# Patient Record
Sex: Female | Born: 1940
Health system: Southern US, Community
[De-identification: ages and names within clinical notes are randomized; demographics above are authoritative.]

## PROBLEM LIST (undated history)

## (undated) DIAGNOSIS — I2510 Atherosclerotic heart disease of native coronary artery without angina pectoris: Secondary | ICD-10-CM

## (undated) DIAGNOSIS — C4490 Unspecified malignant neoplasm of skin, unspecified: Secondary | ICD-10-CM

## (undated) DIAGNOSIS — I119 Hypertensive heart disease without heart failure: Secondary | ICD-10-CM

## (undated) DIAGNOSIS — I495 Sick sinus syndrome: Secondary | ICD-10-CM

## (undated) DIAGNOSIS — J189 Pneumonia, unspecified organism: Secondary | ICD-10-CM

## (undated) DIAGNOSIS — I480 Paroxysmal atrial fibrillation: Secondary | ICD-10-CM

## (undated) DIAGNOSIS — E119 Type 2 diabetes mellitus without complications: Secondary | ICD-10-CM

## (undated) DIAGNOSIS — N183 Chronic kidney disease, stage 3 unspecified: Secondary | ICD-10-CM

## (undated) DIAGNOSIS — I5032 Chronic diastolic (congestive) heart failure: Secondary | ICD-10-CM

## (undated) DIAGNOSIS — E039 Hypothyroidism, unspecified: Secondary | ICD-10-CM

## (undated) DIAGNOSIS — E785 Hyperlipidemia, unspecified: Secondary | ICD-10-CM

## (undated) DIAGNOSIS — F329 Major depressive disorder, single episode, unspecified: Secondary | ICD-10-CM

## (undated) DIAGNOSIS — E875 Hyperkalemia: Secondary | ICD-10-CM

## (undated) DIAGNOSIS — K219 Gastro-esophageal reflux disease without esophagitis: Secondary | ICD-10-CM

## (undated) DIAGNOSIS — Z951 Presence of aortocoronary bypass graft: Secondary | ICD-10-CM

## (undated) DIAGNOSIS — R001 Bradycardia, unspecified: Secondary | ICD-10-CM

## (undated) DIAGNOSIS — R52 Pain, unspecified: Secondary | ICD-10-CM

## (undated) DIAGNOSIS — G3184 Mild cognitive impairment, so stated: Secondary | ICD-10-CM

## (undated) DIAGNOSIS — K5790 Diverticulosis of intestine, part unspecified, without perforation or abscess without bleeding: Secondary | ICD-10-CM

## (undated) DIAGNOSIS — Z9889 Other specified postprocedural states: Secondary | ICD-10-CM

## (undated) DIAGNOSIS — K449 Diaphragmatic hernia without obstruction or gangrene: Secondary | ICD-10-CM

## (undated) DIAGNOSIS — F32A Depression, unspecified: Secondary | ICD-10-CM

## (undated) DIAGNOSIS — K222 Esophageal obstruction: Secondary | ICD-10-CM

## (undated) DIAGNOSIS — K649 Unspecified hemorrhoids: Secondary | ICD-10-CM

## (undated) DIAGNOSIS — M1990 Unspecified osteoarthritis, unspecified site: Secondary | ICD-10-CM

## (undated) DIAGNOSIS — D649 Anemia, unspecified: Secondary | ICD-10-CM

## (undated) DIAGNOSIS — F419 Anxiety disorder, unspecified: Secondary | ICD-10-CM

## (undated) DIAGNOSIS — I219 Acute myocardial infarction, unspecified: Secondary | ICD-10-CM

## (undated) DIAGNOSIS — I10 Essential (primary) hypertension: Secondary | ICD-10-CM

## (undated) DIAGNOSIS — R519 Headache, unspecified: Secondary | ICD-10-CM

## (undated) DIAGNOSIS — R112 Nausea with vomiting, unspecified: Secondary | ICD-10-CM

## (undated) DIAGNOSIS — Z950 Presence of cardiac pacemaker: Secondary | ICD-10-CM

## (undated) DIAGNOSIS — I4892 Unspecified atrial flutter: Secondary | ICD-10-CM

## (undated) DIAGNOSIS — G8929 Other chronic pain: Secondary | ICD-10-CM

## (undated) HISTORY — PX: JOINT REPLACEMENT: SHX530

## (undated) HISTORY — DX: Chronic kidney disease, stage 3 unspecified: N18.30

## (undated) HISTORY — DX: Diverticulosis of intestine, part unspecified, without perforation or abscess without bleeding: K57.90

## (undated) HISTORY — DX: Hypertensive heart disease without heart failure: I11.9

## (undated) HISTORY — DX: Other chronic pain: G89.29

## (undated) HISTORY — PX: DILATION AND CURETTAGE OF UTERUS: SHX78

## (undated) HISTORY — DX: Unspecified atrial flutter: I48.92

## (undated) HISTORY — DX: Esophageal obstruction: K22.2

## (undated) HISTORY — PX: FRACTURE SURGERY: SHX138

## (undated) HISTORY — PX: ANKLE FRACTURE SURGERY: SHX122

## (undated) HISTORY — PX: ESOPHAGOGASTRODUODENOSCOPY (EGD) WITH ESOPHAGEAL DILATION: SHX5812

## (undated) HISTORY — DX: Chronic diastolic (congestive) heart failure: I50.32

## (undated) HISTORY — DX: Diaphragmatic hernia without obstruction or gangrene: K44.9

## (undated) HISTORY — DX: Headache, unspecified: R51.9

## (undated) HISTORY — PX: CATARACT EXTRACTION W/ INTRAOCULAR LENS  IMPLANT, BILATERAL: SHX1307

## (undated) HISTORY — PX: ABDOMINAL HYSTERECTOMY: SHX81

## (undated) HISTORY — DX: Sick sinus syndrome: I49.5

## (undated) HISTORY — PX: TOTAL KNEE ARTHROPLASTY: SHX125

## (undated) HISTORY — DX: Chronic kidney disease, stage 3 (moderate): N18.3

## (undated) HISTORY — DX: Hyperlipidemia, unspecified: E78.5

## (undated) HISTORY — DX: Unspecified hemorrhoids: K64.9

---
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
## 1999-03-16 ENCOUNTER — Encounter: Payer: Self-pay | Admitting: Cardiology

## 1999-03-16 ENCOUNTER — Ambulatory Visit (HOSPITAL_COMMUNITY): Admission: RE | Admit: 1999-03-16 | Discharge: 1999-03-16 | Payer: Self-pay | Admitting: Cardiology

## 2000-04-05 ENCOUNTER — Other Ambulatory Visit: Admission: RE | Admit: 2000-04-05 | Discharge: 2000-04-05 | Payer: Self-pay | Admitting: Gynecology

## 2001-06-01 ENCOUNTER — Encounter: Admission: RE | Admit: 2001-06-01 | Discharge: 2001-08-30 | Payer: Self-pay | Admitting: Orthopedic Surgery

## 2002-06-19 ENCOUNTER — Ambulatory Visit (HOSPITAL_COMMUNITY): Admission: RE | Admit: 2002-06-19 | Discharge: 2002-06-19 | Payer: Self-pay | Admitting: Cardiology

## 2002-06-19 ENCOUNTER — Encounter: Payer: Self-pay | Admitting: Cardiology

## 2004-02-27 ENCOUNTER — Other Ambulatory Visit: Admission: RE | Admit: 2004-02-27 | Discharge: 2004-02-27 | Payer: Self-pay | Admitting: Gynecology

## 2004-10-22 ENCOUNTER — Ambulatory Visit: Payer: Self-pay | Admitting: Family Medicine

## 2004-10-25 HISTORY — PX: BACK SURGERY: SHX140

## 2004-11-13 ENCOUNTER — Ambulatory Visit: Payer: Self-pay | Admitting: Family Medicine

## 2004-11-17 ENCOUNTER — Ambulatory Visit: Payer: Self-pay | Admitting: Family Medicine

## 2004-12-14 ENCOUNTER — Ambulatory Visit: Payer: Self-pay | Admitting: Family Medicine

## 2004-12-18 ENCOUNTER — Ambulatory Visit: Payer: Self-pay | Admitting: Family Medicine

## 2004-12-31 ENCOUNTER — Ambulatory Visit: Payer: Self-pay | Admitting: Family Medicine

## 2005-04-23 ENCOUNTER — Ambulatory Visit: Payer: Self-pay | Admitting: Family Medicine

## 2005-05-18 ENCOUNTER — Ambulatory Visit: Payer: Self-pay | Admitting: Family Medicine

## 2005-07-14 ENCOUNTER — Ambulatory Visit: Payer: Self-pay | Admitting: Family Medicine

## 2005-10-05 ENCOUNTER — Ambulatory Visit: Payer: Self-pay | Admitting: Family Medicine

## 2005-10-13 ENCOUNTER — Ambulatory Visit: Payer: Self-pay | Admitting: Family Medicine

## 2005-11-19 ENCOUNTER — Inpatient Hospital Stay (HOSPITAL_COMMUNITY): Admission: RE | Admit: 2005-11-19 | Discharge: 2005-11-24 | Payer: Self-pay | Admitting: Orthopedic Surgery

## 2005-11-19 ENCOUNTER — Ambulatory Visit: Payer: Self-pay | Admitting: Physical Medicine & Rehabilitation

## 2005-11-24 ENCOUNTER — Encounter: Payer: Self-pay | Admitting: Internal Medicine

## 2006-09-26 ENCOUNTER — Inpatient Hospital Stay (HOSPITAL_COMMUNITY): Admission: RE | Admit: 2006-09-26 | Discharge: 2006-09-27 | Payer: Self-pay | Admitting: Orthopedic Surgery

## 2007-08-22 ENCOUNTER — Ambulatory Visit: Payer: Self-pay | Admitting: Internal Medicine

## 2007-08-31 ENCOUNTER — Ambulatory Visit: Payer: Self-pay | Admitting: Internal Medicine

## 2007-12-19 DIAGNOSIS — E8941 Symptomatic postprocedural ovarian failure: Secondary | ICD-10-CM | POA: Insufficient documentation

## 2007-12-19 DIAGNOSIS — N7093 Salpingitis and oophoritis, unspecified: Secondary | ICD-10-CM | POA: Insufficient documentation

## 2007-12-19 DIAGNOSIS — S8990XA Unspecified injury of unspecified lower leg, initial encounter: Secondary | ICD-10-CM | POA: Insufficient documentation

## 2007-12-19 DIAGNOSIS — Z96659 Presence of unspecified artificial knee joint: Secondary | ICD-10-CM | POA: Insufficient documentation

## 2007-12-19 DIAGNOSIS — M545 Low back pain, unspecified: Secondary | ICD-10-CM | POA: Insufficient documentation

## 2007-12-19 DIAGNOSIS — S99919A Unspecified injury of unspecified ankle, initial encounter: Secondary | ICD-10-CM

## 2007-12-19 DIAGNOSIS — S99929A Unspecified injury of unspecified foot, initial encounter: Secondary | ICD-10-CM | POA: Insufficient documentation

## 2007-12-19 DIAGNOSIS — K5909 Other constipation: Secondary | ICD-10-CM | POA: Insufficient documentation

## 2007-12-19 DIAGNOSIS — K649 Unspecified hemorrhoids: Secondary | ICD-10-CM | POA: Insufficient documentation

## 2009-11-28 ENCOUNTER — Ambulatory Visit: Payer: Self-pay | Admitting: Internal Medicine

## 2009-11-28 DIAGNOSIS — R143 Flatulence: Secondary | ICD-10-CM

## 2009-11-28 DIAGNOSIS — K5900 Constipation, unspecified: Secondary | ICD-10-CM | POA: Insufficient documentation

## 2009-11-28 DIAGNOSIS — R142 Eructation: Secondary | ICD-10-CM

## 2009-11-28 DIAGNOSIS — K222 Esophageal obstruction: Secondary | ICD-10-CM | POA: Insufficient documentation

## 2009-11-28 DIAGNOSIS — K219 Gastro-esophageal reflux disease without esophagitis: Secondary | ICD-10-CM | POA: Insufficient documentation

## 2009-11-28 DIAGNOSIS — R141 Gas pain: Secondary | ICD-10-CM | POA: Insufficient documentation

## 2010-11-26 NOTE — Assessment & Plan Note (Signed)
Summary: STOMACH SWELLING AND ESOPHAGUS PROBLEMS...EM    History of Present Illness Visit Type: Follow-up Visit Primary GI MD: Scarlette Shorts MD Primary Provider: Haywood Pao, MD  Requesting Provider: n/a Chief Complaint: Dysphagia, and epigastric pain  History of Present Illness:   70 year old female with multiple medical problems including hypertension, hyperlipidemia, hypothyroidism, type 2 diabetes mellitus, gastroesophageal reflux disease complicated by peptic stricture, and mild diverticulosis. She presents today complaining of reflux disease, mild dysphagia, belching, regurgitation, increased gas, and constipation. It appears that the patient ran out of Nexium in November due to "the doughnut hole". She began to have significant heartburn associated with chest discomfort and recurrent dysphagia. As well significant regurgitation. She resumed medication approximately 2 weeks ago. Burning and dysphagia have resolved. Regurgitation continues. She is not adhering to reflux precautions. She requests samples of proton pump inhibitor. For her constipation she takes stool softeners without relief. She has tried MiraLax for a short period of time. She is uncertain of the dose or duration. Her multiple chronic medical problems are stable. She is consciously swallowing air and belching during the interview.   GI Review of Systems    Reports acid reflux, belching, dysphagia with solids, heartburn, and  nausea.      Denies abdominal pain, bloating, chest pain, dysphagia with liquids, loss of appetite, vomiting, vomiting blood, weight loss, and  weight gain.      Reports constipation.     Denies anal fissure, black tarry stools, change in bowel habit, diarrhea, diverticulosis, fecal incontinence, heme positive stool, hemorrhoids, irritable bowel syndrome, jaundice, light color stool, liver problems, rectal bleeding, and  rectal pain.    Current Medications (verified): 1)  Atenolol 50 Mg Tabs  (Atenolol) .Marland Kitchen.. 1 Tablet By Mouth Once Daily 2)  Synthroid 50 Mcg Tabs (Levothyroxine Sodium) .Marland Kitchen.. 1 Tablet By Mouth Once Daily 3)  Janumet 50-500 Mg Tabs (Sitagliptin-Metformin Hcl) .... One Tablet To One and 1/2 Tablet By Mouth Daily 4)  Quinapril Hcl 20 Mg Tabs (Quinapril Hcl) .... One Tablet By Mouth Once Daily 5)  Lyrica 75 Mg Caps (Pregabalin) .... One Tablet By Mouth Once Daily 6)  Nexium 40 Mg Cpdr (Esomeprazole Magnesium) .... One Tablet By Mouth Once Daily 7)  Bayer Low Strength 81 Mg Tbec (Aspirin) .... One Tablet By Mouth Once Daily 8)  Centrum Cardio  Tabs (Multiple Vitamins-Minerals) .... One Tablet By Mouth Once Daily 9)  Stool Softener 100 Mg Caps (Docusate Sodium) .... Two Tablets By Mouth At Bedtime 10)  Centrum Silver  Tabs (Multiple Vitamins-Minerals) .... One Tablet By Mouth Once Daily 11)  Vitamin E 600 Unit  Caps (Vitamin E) .... One Tablet By Mouth Once Daily 12)  Vitamin B-6 250 Mg Tabs (Pyridoxine Hcl) .... One Tablet By Mouth Once Daily 13)  Fish Oil 1000 Mg Caps (Omega-3 Fatty Acids) .... One Tablet By Mouth Once Daily 14)  Vitamin C 500 Mg  Tabs (Ascorbic Acid) .... One Tablet By Mouth Once Daily 15)  Vitamin D 1000 Unit Tabs (Cholecalciferol) .... One Tablet By Mouth Once Daily 16)  Tylenol Arthritis Pain 650 Mg Cr-Tabs (Acetaminophen) .... As Needed  Allergies (verified): 1)  ! * Mycins 2)  ! Epinephrine  Past History:  Past Medical History: Reviewed history from 11/26/2009 and no changes required. Anemia Diabetes, Type 2 Diverticulitis G E R D Hyperlipidemia Hypertension Hypothyroidism Hemorrhoids  Past Surgical History: Tonsillectomy Hysterectomy BSO Lt. TKR Rt. ankle  Back Surgery Cataract Surgery for left eye   Family History: No  FH of Colon Cancer:  Social History: Occupation: Retired Widowed   Patient has never smoked.  Alcohol Use - no Illicit Drug Use - no Daily Caffeine Use: coffee daily  Smoking Status:  never Drug Use:   no  Review of Systems       arthritis, fatigue. All other systems reviewed and reported as negative  Vital Signs:  Patient profile:   70 year old female Height:      61 inches Weight:      143 pounds BMI:     27.12 BSA:     1.64 Pulse rate:   68 / minute Pulse rhythm:   regular BP sitting:   124 / 72  (left arm) Cuff size:   regular  Vitals Entered By: Hope Pigeon CMA (November 28, 2009 1:28 PM)  Physical Exam  General:  Well developed, well nourished, no acute distress. Head:  Normocephalic and atraumatic. Eyes:  PERRLA, no icterus. Nose:  No deformity, discharge,  or lesions. Mouth:  No deformity or lesions,  Lungs:  Clear throughout to auscultation. Heart:  Regular rate and rhythm; no murmurs, rubs,  or bruits. Abdomen:  Soft, nontender and nondistended. No masses, hepatosplenomegaly or hernias noted. Normal bowel sounds. Msk:  Symmetrical with no gross deformities. Normal posture. Pulses:  Normal pulses noted. Extremities:  No clubbing, cyanosis, edema or deformities noted. Neurologic:  Alert and  oriented x4;  Skin:  Intact without significant lesions or rashes. Psych:  Alert and cooperative. Normal mood and affect.   Impression & Recommendations:  Problem # 1:  GERD (ICD-530.81) GERD. Upper endoscopy in 2008 revealing mild esophagitis and peptic stricture. Significant recurrence of GERD symptoms off proton pump inhibitor therapy. Improvement in symptoms after resuming Nexium.  Plan  #1. Strict adherence to reflux precautions with attention to weight loss, small meal size, elevation of head of bed. #2. Literature on reflux disease reviewed and brochure provided #3. Literature on reflux precautions reviewed and brochure provided #4. Nexium 40 mg daily. Some samples given at her request #5. If she cannot afford Nexium and take omeprazole 20 mg or 40 mg daily to control symptoms. #6. Greater than one half of today's office encounter was spent counseling this  patient. #7. Routine followup in one year. Sooner for interval problems.  Problem # 2:  ESOPHAGEAL STRICTURE (ICD-530.3) esophageal stricture previously requiring dilation. Patient has had some recurrent transient dysphagia, though this has improved on PPI therapy. Continue PPI therapy. Consider repeat esophageal dilation should significant dysphagia recur.  Problem # 3:  FLATULENCE-GAS-BLOATING (ICD-787.3) I discussed with the patient the issues regarding bloating and belching. I told her that swallowing air with definite result in increased belching. She should refrain from this activity. Also, she was provided literature on intestinal gas. This was reviewed.  Problem # 4:  CONSTIPATION (ICD-564.00) functional constipation. Colonoscopy in 2008 essentially normal.  Plan #1. GlycoLax powder. Start with 17 g daily in 8 ounces of water. I explained to her that she could titrate this in order to achieve the desired effect.  Patient Instructions: 1)  Miralax instructions given to patient with discount coupon. 2)  Nexium sample given. 3)  Gas prevention diet info given. 4)  GERD Brochure given to patient. 5)  Please schedule a follow-up appointment as needed; otherwise one year.  6)  The medication list was reviewed and reconciled.  All changed / newly prescribed medications were explained.  A complete medication list was provided to the patient / caregiver. 7)  Copy Dr. Domenick Gong

## 2010-11-26 NOTE — Discharge Summary (Signed)
Summary: Discharge Summary  NAMEAKI, [name]               ACCOUNT NO.:  [id number]   MEDICAL RECORD NO.:  [id number]          PATIENT TYPE:  INP   LOCATION:  [place]                         FACILITY:  [facility]   PHYSICIAN:  Gaynelle Arabian, M.D.    DATE OF BIRTH:  October 02, 1941   DATE OF ADMISSION:  11/19/2005  DATE OF DISCHARGE:  11/25/2005                           DISCHARGE SUMMARY - REFERRING   ADMISSION DIAGNOSES:  1.  Osteoarthritis, left knee.  2.  Mild episodic anxiety/depression.  3.  Past history of bronchitis.  4.  Hypertension.  5.  Hyperkalemia.  6.  Reflux disease.  7.  Past history of ulcers.  8.  Diverticulosis.  9.  Urinary incontinence.  10. Peripheral neuropathy.  11. Non-insulin-dependent diabetes mellitus.   DISCHARGE DIAGNOSES:  1.  Osteoarthritis, left knee, status post left total knee arthroplasty.  2.  Postoperative blood loss anemia, did not require transfusion.  3.  Mild episodic anxiety/depression.  4.  Hypertension.  5.  Hyperkalemia.  6.  Reflux disease.  7.  Past history of ulcers.  8.  Diverticulosis.  9.  Urinary incontinence.  10. Peripheral neuropathy.  11. Non-insulin-dependent diabetes mellitus.  12. Past history of bronchitis.   PROCEDURE:  November 19, 2005.   SURGEON:  Gaynelle Arabian, M.D.   ASSISTANT:  Mickel Crow, P.A.C.   ANESTHESIA:  Spinal.   TOURNIQUET TIME:  32 minutes.   CONSULTATIONS:  Rehabilitation Services.   HISTORY OF PRESENT ILLNESS:  [name] is a 70 year old female with severe  end-stage arthritis of the left knee, with bone on bone throughout.  Failed  nonoperative management.  Now presents for a total knee.   LABORATORY DATA:  Preoperative CBC:  Hemoglobin and hematocrit 11.9 and  33.7. Normal white count. Serial H&H's were followed. Dropped down to 8.2,  and came back up to 8.9. PT/PTT on admission 12.6 and 31 respectively. INR  0.9. Serial protime as follows:  PT/INR 27.9 and 2.6. Chem panel on  admission:  Elevated potassium of 5.3. Remaining chem panel within normal  limits. Serial BMETs were followed. Potassium came down to normal level of  4.2. Remaining BMETs within normal limits. Preoperative day negative.   Chest x-ray:  Normal chest on November 15, 2005. Left humerus films:  Humerus  itself feels normal. One can appreciate calcifications distal to the humeral  head, that are probably a piece calcium of the rotator cuff tendons, loosely  associated with the radial head. That is just on the edge of the film and  could represent a radial head fracture. Elbow not examined.   HOSPITAL COURSE:  Admitted to [facility]  procedure well. Later transferred to the recovery room and to the orthopedic  floor. Started on PCA and p.o. analgesics for pain control following  surgery. Home medications were restarted. Placed on weightbearing as  tolerated. PT and OT were consulted for ambulation and ADLs. On day 1, was  doing pretty well with her pain. Hemovac drain was discontinued. She  underwent a rehab consult. The patient was seen in rehab service and felt to  be an appropriate candidate for inpatient rehab [facility] stay. Therefore, it was  decided that she would transfer at which time a bed became available. By day  2, she started getting up and dressing was changed. Incision was healing  well. She started progressing with her physical therapy and could ambulate  approximately 60 feet by day 3. Hemoglobin had dropped down to 8.2, but she  was asymptomatic with this. She was placed on Trinsicon. Hemoglobin came  back at 8.9. She started progressing very well with physical therapy. It was  decided whether she would go to [facility] or rehab.  She as doing well on rounds  on November 25, 2005.  The patient was ready for transfer. We are awaiting  the final word if there is a bed available on [facility] versus [facility],  which was another bed offer. At the time of this  dictation, we are waiting  to hear from both, but the patient can be discharged.   DISCHARGE PLAN:  Patient discharged on November 25, 2005.   DISCHARGE DIAGNOSES:  Please see above.   DISCHARGE MEDICATIONS:  1.  Coumadin protocol.  Titrate Coumadin protocol for 3 weeks from date of      surgery. Titrate the INR between 2.0 and 3.0.  2.  Colace 100 mg p.o. b.i.d.  3.  Avandia 8 mg p.o. daily.  4.  Glucophage 1000 mg p.o. daily.  5.  Tenormin 50 mg daily.  6.  Neurontin 300 mg p.o. b.i.d.  7.  Nexium 40 mg p.o. daily.  8.  Trinsicon 1 p.o. t.i.d. x3 weeks.  9.  Percocet 1 or 2 every 4-6 hours as needed for pain.  10. Tylenol 1 to 2 every 4-6 hours as needed for mild pain or temporal      headache.  11. Robaxin 500 mg p.o. q.6 h. p.r.n. spasm.  12. Laxative of choice.  13. Enema of choice.   DIET:  Diabetic cardiac diet.   ACTIVITY:  She can be weightbearing as tolerated on the left lower  extremity. Gait training, ambulation, and ADL's as per PT and OT while on  either rehabilitation or inpatient unit. Total knee protocol. She may start  showering and daily dressing changes.   FOLLOW UP:  Two weeks from surgery. Contact the office at [phone] to  arrange an appointment at time of transfer of the patient, about 2 weeks  postoperative.   DISPOSITION:  Pending at the time of this dictation.   CONDITION ON DISCHARGE:  Improved.      Alexzandrew L. Dara Lords, P.A.      Gaynelle Arabian, M.D.  Electronically Signed    ALP/MEDQ  D:  11/24/2005  T:  11/24/2005  Job:  [id number]   cc:   Mardene Celeste A. Rocky Link, M.D.  Fax: [phone]

## 2010-11-26 NOTE — Procedures (Signed)
Summary: Gastroenterology EGD  Gastroenterology EGD   Imported By: Marisue Humble CMA 12/28/2007 11:20:35  _____________________________________________________________________  External Attachment:    Type:   Image     Comment:   External Document

## 2010-11-26 NOTE — Procedures (Signed)
Summary: Gastroenterology COLON  Gastroenterology COLON   Imported By: Marisue Humble CMA 12/28/2007 11:19:02  _____________________________________________________________________  External Attachment:    Type:   Image     Comment:   External Document

## 2010-12-14 ENCOUNTER — Encounter: Payer: Self-pay | Admitting: Internal Medicine

## 2010-12-14 ENCOUNTER — Ambulatory Visit (INDEPENDENT_AMBULATORY_CARE_PROVIDER_SITE_OTHER): Payer: Medicare Other | Admitting: Internal Medicine

## 2010-12-14 DIAGNOSIS — R1310 Dysphagia, unspecified: Secondary | ICD-10-CM

## 2010-12-14 DIAGNOSIS — R141 Gas pain: Secondary | ICD-10-CM

## 2010-12-14 DIAGNOSIS — E119 Type 2 diabetes mellitus without complications: Secondary | ICD-10-CM | POA: Insufficient documentation

## 2010-12-14 DIAGNOSIS — R1013 Epigastric pain: Secondary | ICD-10-CM

## 2010-12-14 DIAGNOSIS — R143 Flatulence: Secondary | ICD-10-CM

## 2010-12-14 DIAGNOSIS — K219 Gastro-esophageal reflux disease without esophagitis: Secondary | ICD-10-CM

## 2010-12-14 DIAGNOSIS — IMO0001 Reserved for inherently not codable concepts without codable children: Secondary | ICD-10-CM | POA: Insufficient documentation

## 2010-12-17 ENCOUNTER — Telehealth: Payer: Self-pay | Admitting: Internal Medicine

## 2010-12-22 NOTE — Assessment & Plan Note (Signed)
Summary:  "Stomach swelling , gas, and esophagus problems "    History of Present Illness Visit Type: Follow-up Visit Primary GI MD: Scarlette Shorts MD Primary Provider: Haywood Pao, MD  Requesting Provider: n/a Chief Complaint: dyspha, GERD  worsening symptoms x 2 months History of Present Illness:    70 year old female with hypertension, hyperlipidemia, hypothyroidism, diabetes mellitus, and GERD. she was evaluated in October of 2008 for GERD , dysphagia, and colon cancer screening. in November of 2008, she underwent colonoscopy and upper endoscopy. Colonoscopy was unremarkable. Upper endoscopy is normal except for mild reflux esophagitis and a large caliber distal stricture which was dilated. It was recommended that she continue on PPI therapy. However, she is come off of PPI therapy due to cost. She takes over-the-counter acid suppressing agents on a when necessary basis. Recently she has experienced severe process and some discomfort postprandially. She saw  chiropractorwho told her that acid suppressive medication was not a good idea. When she takes PPI therapy more regularly things are better. She is concerned that she may have esophageal cancer. Some mild  dysphagia. No weight loss. She does complain of chronic belching and flatus.Marland Kitchen   GI Review of Systems    Reports abdominal pain, acid reflux, belching, bloating, chest pain, dysphagia with solids, heartburn, nausea, and  vomiting.     Location of  Abdominal pain: epigastric area.    Denies dysphagia with liquids, loss of appetite, vomiting blood, weight loss, and  weight gain.      Reports constipation.     Denies anal fissure, black tarry stools, change in bowel habit, diarrhea, diverticulosis, fecal incontinence, heme positive stool, hemorrhoids, irritable bowel syndrome, jaundice, light color stool, liver problems, rectal bleeding, and  rectal pain.    Current Medications (verified): 1)  Lisinopril 20 Mg Tabs (Lisinopril) ....  Take 1 Tablet By Mouth Once Daily 2)  Synthroid 50 Mcg Tabs (Levothyroxine Sodium) .Marland Kitchen.. 1 Tablet By Mouth Once Daily 3)  Janumet 50-500 Mg Tabs (Sitagliptin-Metformin Hcl) .... One Tablet By Mouth Two Times A Day 4)  Quinapril Hcl 20 Mg Tabs (Quinapril Hcl) .... One Tablet By Mouth Once Daily 5)  Lyrica 75 Mg Caps (Pregabalin) .... One Tablet By Mouth Once Daily 6)  Nexium 40 Mg Cpdr (Esomeprazole Magnesium) .... One Tablet By Mouth Once Daily 7)  Bayer Low Strength 81 Mg Tbec (Aspirin) .... One Tablet By Mouth Once Daily 8)  Centrum Cardio  Tabs (Multiple Vitamins-Minerals) .... One Tablet By Mouth Once Daily 9)  Stool Softener 100 Mg Caps (Docusate Sodium) .... Two Tablets By Mouth At Bedtime 10)  Centrum Silver  Tabs (Multiple Vitamins-Minerals) .... One Tablet By Mouth Once Daily 11)  Vitamin E 600 Unit  Caps (Vitamin E) .... One Tablet By Mouth Once Daily 12)  Vitamin B-6 250 Mg Tabs (Pyridoxine Hcl) .... One Tablet By Mouth Once Daily 13)  Fish Oil 1000 Mg Caps (Omega-3 Fatty Acids) .... One Tablet By Mouth Once Daily 14)  Vitamin C 500 Mg  Tabs (Ascorbic Acid) .... One Tablet By Mouth Once Daily 15)  Vitamin D 1000 Unit Tabs (Cholecalciferol) .... One Tablet By Mouth Once Daily 16)  Tylenol Arthritis Pain 650 Mg Cr-Tabs (Acetaminophen) .... As Needed 17)  Glimepiride 4 Mg Tabs (Glimepiride) .... Take 1 Tablet By Mouth Once Daily 18)  Miralax  Powd (Polyethylene Glycol 3350) .... As Needed  Allergies (verified): 1)  ! * Mycins 2)  ! Epinephrine  Past History:  Past Medical History: Reviewed  history from 11/26/2009 and no changes required. Anemia Diabetes, Type 2 Diverticulitis G E R D Hyperlipidemia Hypertension Hypothyroidism Hemorrhoids  Past Surgical History: Tonsillectomy Hysterectomy BSO Lt. TKR Rt. ankle  Back Surgery Cataract Surgery x2  Family History: Reviewed history from 11/28/2009 and no changes required. No FH of Colon Cancer:  Social  History: Reviewed history from 11/28/2009 and no changes required. Occupation: Retired Widowed   Patient has never smoked.  Alcohol Use - no Illicit Drug Use - no Daily Caffeine Use: coffee daily   Review of Systems       The patient complains of allergy/sinus, arthritis/joint pain, fatigue, and headaches-new.  The patient denies anemia, anxiety-new, back pain, blood in urine, breast changes/lumps, change in vision, confusion, cough, coughing up blood, depression-new, fainting, fever, hearing problems, heart murmur, heart rhythm changes, itching, menstrual pain, muscle pains/cramps, night sweats, nosebleeds, pregnancy symptoms, shortness of breath, skin rash, sleeping problems, sore throat, swelling of feet/legs, swollen lymph glands, thirst - excessive , urination - excessive , urination changes/pain, urine leakage, vision changes, and voice change.    Vital Signs:  Patient profile:   70 year old female Height:      61 inches Weight:      139.13 pounds BMI:     26.38 Pulse rate:   72 / minute Pulse rhythm:   regular BP sitting:   162 / 78  (left arm) Cuff size:   regular  Vitals Entered By: June McMurray [employer] Deborra Medina) (December 14, 2010 10:32 AM)  Physical Exam  General:  Well developed, well nourished, no acute distress. Head:  Normocephalic and atraumatic. Eyes:  PERRLA, no icterus. Nose:  No deformity, discharge,  or lesions. Mouth:  No deformity or lesions. Neck:  Supple; no masses or thyromegaly. Lungs:  Clear throughout to auscultation. Heart:  Regular rate and rhythm; no murmurs, rubs,  or bruits. Abdomen:  Soft, nontender and nondistended. No masses, hepatosplenomegaly or hernias noted. Normal bowel sounds. Msk:  Symmetrical with no gross deformities. Normal posture. Pulses:  Normal pulses noted. Extremities:  No clubbing, cyanosis, edema or deformities noted. Neurologic:  Alert and  oriented x4;  grossly normal neurologically. Skin:  Intact without significant lesions  or rashes. Psych:  Alert and cooperative. Normal mood and affect.   Impression & Recommendations:  Problem # 1:  GERD (ICD-530.81)  worsening GERD off regular PPI therapy   Plan : #1. Nexium 40 mg daily. 30 minutes before first meal  #2. reflux precautions  #3. literature on reflux precautions provided  Problem # 2:  DYSPHAGIA UNSPECIFIED (ICD-787.20)  mild dysphagia. history of peptic stricture. may be due to esophageal edema off PPI.   Plan :   #1. daily PPI #2. Upper endoscopy with possible esophageal dilation. The nature of the procedure as well as the risks, benefits, and alternatives were reviewed. She understood and agreed to proceed  #3.  adjust diabetic medications. See below  Problem # 3:  FLATULENCE-GAS-BLOATING (ICD-787.3)  gas and belching. no worrisome features.  Plan : #1. anti-gas and flatulence dietary sheet #2. discussion with patient today on intestinal gas as well as educational brochure provided for her review.  Problem # 4:  CONSTIPATION (ICD-564.00) Assessment: Unchanged  functional  Problem # 5:  DIABETES MELLITUS-TYPE II (ICD-250.00)  hold diabetic medications the day of the procedure until normal oral intake resumed. We will monitor her blood sugar immediately before and after the procedure  Patient Instructions: 1)  Patient will call to schedule EGD Dil once she  checks with her daughter.  [phone] (You are to hold DM meds day of procedure.)  Ask for [facility] when you call back to schedule procedure. 2)  Nexium samples given to patient. 3)  Reflux precaution sheet given. 4)  Gas brochure and prevention diet given. 5)  Copy sent to : Haywood Pao, MD  6)  The medication list was reviewed and reconciled.  All changed / newly prescribed medications were explained.  A complete medication list was provided to the patient / caregiver.

## 2010-12-28 ENCOUNTER — Encounter (INDEPENDENT_AMBULATORY_CARE_PROVIDER_SITE_OTHER): Payer: Self-pay

## 2010-12-29 ENCOUNTER — Encounter: Payer: Self-pay | Admitting: Internal Medicine

## 2010-12-30 ENCOUNTER — Other Ambulatory Visit: Payer: Self-pay | Admitting: Internal Medicine

## 2010-12-30 ENCOUNTER — Encounter (AMBULATORY_SURGERY_CENTER): Payer: Medicare Other | Admitting: Internal Medicine

## 2010-12-30 DIAGNOSIS — K219 Gastro-esophageal reflux disease without esophagitis: Secondary | ICD-10-CM

## 2010-12-30 DIAGNOSIS — R1310 Dysphagia, unspecified: Secondary | ICD-10-CM

## 2010-12-30 DIAGNOSIS — K209 Esophagitis, unspecified without bleeding: Secondary | ICD-10-CM

## 2010-12-31 LAB — GLUCOSE, CAPILLARY
Glucose-Capillary: 126 mg/dL — ABNORMAL HIGH (ref 70–99)
Glucose-Capillary: 63 mg/dL — ABNORMAL LOW (ref 70–99)
Glucose-Capillary: 95 mg/dL (ref 70–99)

## 2010-12-31 NOTE — Progress Notes (Signed)
Summary: Speak directly to [facility]   Phone Note Call from Patient Call back at [facility] Phone [phone]   Caller: Patient Call For: Dr. Henrene Pastor Reason for Call: Talk to Nurse Summary of Call: Pt said she was told to ask for [facility] about scheduling her Endo w/Dilation Initial call taken by: Webb Laws,  December 17, 2010 9:11 AM  Follow-up for Phone Call        called patient to schedule Endo.  Asked her to call me back to schedule.  Randye Lobo [facility]  December 21, 2010 4:36 PM    Pt. called and left message on voicemail.  I returned her call.  She has to work around her daughter's schedule in order for her to schedule procedure.  She is limited to what days she can come in.  She is going to check with her daughter and see if she can come 12/30/10 at 3:30 pm.  If not, I will ask Dr. Henrene Pastor if he can open a slot for her on 01/01/11.  Otherwise, she will not be able to have it until April.  She states she does not want to wait that long because she is having alot of problems.  Randye Lobo [facility]  December 22, 2010 8:50 AM   Additional Follow-up for Phone Call Additional follow up Details #1::        EGD with dil scheduled for 12/31/10 and pre-visit for 12/30/10 at 1:00 pm. Additional Follow-up by: Randye Lobo NCMA,  December 22, 2010 4:32 PM

## 2011-01-05 NOTE — Letter (Signed)
Summary: Diabetic Instructions  [facility]  [place], [place] [address]   Phone: [phone]  Fax: [phone]    [name] Dec 09, 1940 MRN: [id number]   _  _   ORAL DIABETIC MEDICATION INSTRUCTIONS  The day before your procedure:   Take your diabetic pill as you do normally  The day of your procedure:   Do not take your diabetic pill    We will check your blood sugar levels during the admission process and again in Recovery before discharging you home  ________________________________________________________________________  _  _   INSULIN (LONG ACTING) MEDICATION INSTRUCTIONS (Lantus, NPH, 70/30, Humulin, Novolin-N)   The day before your procedure:   Take  your regular evening dose    The day of your procedure:   Do not take your morning dose    _  _   INSULIN (SHORT ACTING) MEDICATION INSTRUCTIONS (Regular, Humulog, Novolog)   The day before your procedure:   Do not take your evening dose   The day of your procedure:   Do not take your morning dose   _  _   INSULIN PUMP MEDICATION INSTRUCTIONS  We will contact the physician managing your diabetic care for written dosage instructions for the day before your procedure and the day of your procedure.  Once we have received the instructions, we will contact you.

## 2011-01-05 NOTE — Letter (Signed)
Summary: EGD Instructions  [facility]  [place], [place] [address]   Phone: [phone]  Fax: [phone]       [name]    1941-06-15    MRN: [id number]       Procedure Day Sudie Grumbling:  Wednesday 12/30/2010     Arrival Time: 2:30 pm     Procedure Time: 3:30 pm     Location of Procedure:                    _ x _ [place] (4th Floor)    PREPARATION FOR ENDOSCOPY   On Wednesday 3/7 THE DAY OF THE PROCEDURE:  1.   No solid foods, milk or milk products are allowed after midnight the night before your procedure.  2.   Do not drink anything colored red or purple.  Avoid juices with pulp.  No orange juice.  3.  You may drink clear liquids until 1:30 pm, which is 2 hours before your procedure.                                                                                                CLEAR LIQUIDS INCLUDE: Water Jello Ice Popsicles Tea (sugar ok, no milk/cream) Powdered fruit flavored drinks Coffee (sugar ok, no milk/cream) Gatorade Juice: apple, white grape, white cranberry  Lemonade Clear bullion, consomm, broth Carbonated beverages (any kind) Strained chicken noodle soup Hard Candy   MEDICATION INSTRUCTIONS  Unless otherwise instructed, you should take regular prescription medications with a small sip of water as early as possible the morning of your procedure.  Diabetic patients - see separate instructions.         OTHER INSTRUCTIONS  You will need a responsible adult at least 71 years of age to accompany you and drive you home.   This person must remain in the waiting room during your procedure.  Wear loose fitting clothing that is easily removed.  Leave jewelry and other valuables at home.  However, you may wish to bring a book to read or an iPod/MP3 player to listen to music as you wait for your procedure to start.  Remove all body piercing jewelry and leave at home.  Total time from sign-in until discharge  is approximately 2-3 hours.  You should go home directly after your procedure and rest.  You can resume normal activities the day after your procedure.  The day of your procedure you should not:   Drive   Make legal decisions   Operate machinery   Drink alcohol   Return to work  You will receive specific instructions about eating, activities and medications before you leave.    The above instructions have been reviewed and explained to me by   Cornelia Copa RN  December 29, 2010 1:31 PM     I fully understand and can verbalize these instructions _____________________________ Date _________

## 2011-01-05 NOTE — Procedures (Signed)
Summary: Upper Endoscopy  Patient: [name] Note: All result statuses are Final unless otherwise noted.  Tests: (1) Upper Endoscopy (EGD)   EGD Upper Endoscopy       [facility]     [place], [place]  [address]          ENDOSCOPY PROCEDURE REPORT          PATIENT:  [name], Walker  MR#:  [id number]     BIRTHDATE:  Sep 06, 1941, 70 yrs. old  GENDER:  female          ENDOSCOPIST:  Docia Chuck. Geri Seminole, MD     Referred by:  Office          PROCEDURE DATE:  12/30/2010     PROCEDURE:  EGD, diagnostic [id number]     ASA CLASS:  Class II     INDICATIONS:  GERD, dysphagia  (improved on PPI)          MEDICATIONS:   Fentanyl 75 mcg IV, Versed 7 mg IV     TOPICAL ANESTHETIC:  Exactacain Spray          DESCRIPTION OF PROCEDURE:   After the risks benefits and     alternatives of the procedure were thoroughly explained, informed     consent was obtained.  The LB GIF-H180 [id number] endoscope was     introduced through the mouth and advanced to the second portion of     the duodenum, without limitations.  The instrument was slowly     withdrawn as the mucosa was fully examined.     <<PROCEDUREIMAGES>>          Mild Esophagitis (edema) was found in the distal esophagus.     Otherwise normal esophagus.  The stomach was entered and closely     examined. The antrum, angularis, and lesser curvature were well     visualized, including a retroflexed view of the cardia and fundus.     The stomach wall was normally distensable. The scope passed easily     through the pylorus into the duodenum.  The duodenal bulb was     normal in appearance, as was the postbulbar duodenum.     Retroflexed views revealed no abnormalities.    The scope was then     withdrawn from the patient and the procedure completed.          COMPLICATIONS:  None          ENDOSCOPIC IMPRESSION:     1) Esophagitis in the distal esophagus     2) Otherwise normal esophagus     3) Normal  stomach     4) Normal duodenum     5) GERD          RECOMMENDATIONS:     1) Anti-reflux regimen to be followed     2) PPI qam (Nexium, Prilosec or equivalent)          ______________________________     Docia Chuck. Geri Seminole, MD          CC:  The Patient; Domenick Gong, MD          n.     eSIGNED:   Docia Chuck. Geri Seminole at 12/30/2010 03:56 PM          Rolland Bimler, [id number]  Note: An exclamation mark (!) indicates a result that was not dispersed into the flowsheet. Document Creation Date: 12/30/2010 3:56 PM  _______________________________________________________________________  (1) Order result status: Final Collection or observation date-time: 12/30/2010 15:50 Requested date-time:  Receipt date-time:  Reported date-time:  Referring Physician:   Ordering Physician: Lavena Bullion [phone]) Specimen Source:  Source: Tawanna Cooler Order Number: [id number] Lab site:

## 2011-01-05 NOTE — Miscellaneous (Signed)
Summary: [facility] previsit  Clinical Lists Changes  Medications: Added new medication of MOVIPREP 100 GM  SOLR (PEG-KCL-NACL-NASULF-NA ASC-C) As per prep instructions. - Signed Rx of MOVIPREP 100 GM  SOLR (PEG-KCL-NACL-NASULF-NA ASC-C) As per prep instructions.;  #1 x 0;  Signed;  Entered by: Cornelia Copa RN;  Authorized by: Irene Shipper MD;  Method used: Electronically to [facility] [address], [place], [place], [place]  [address], Ph: [phone], Fax: [phone] Observations: Added new observation of ALLERGY REV: Done (12/29/2010 13:12)    Prescriptions: MOVIPREP 100 GM  SOLR (PEG-KCL-NACL-NASULF-NA ASC-C) As per prep instructions.  #1 x 0   Entered by:   Cornelia Copa RN   Authorized by:   Irene Shipper MD   Signed by:   Cornelia Copa RN on 12/29/2010   Method used:   Electronically to        [facility].* (retail)       [address]       [place], [place]  [address]       Ph: [phone]       Fax: [phone]   RxID:   [id number]   Appended Document: L[facility] previsit [facility] in [place] and left message to disregard the electronic Rx sent for Moviprep on this pt.It was an error and the pt did not need  the Moviprep.

## 2011-03-09 NOTE — Assessment & Plan Note (Signed)
[facility] OFFICE NOTE   NAME:Tovey, LILLIENNE INGEMI                      MRN:          [id number]  DATE:08/22/2007                            DOB:          [birth year]    REFERRING PHYSICIAN:  Haywood Pao, M.D.   REASON FOR CONSULTATION:  Reflux disease, dysphagia, and colon cancer  screening.   HISTORY:  This is a 70 year old white female with a history of  hyperlipidemia, hypothyroidism, type 2 diabetes, and hypertension.  She  was referred through the courtesy of Dr. Osborne Casco regarding the above-  listed problems.  First, the patient reports greater than 10 year  history of problems with heartburn and regurgitation.  Before this, she  had been maintained on Nexium.  When taking Nexium on a daily or near-  daily basis and watching her diet, symptoms seem to be controlled.  However, due to insurance coverage issues, she is no longer on Nexium  due to lack of affordability.  She has been trying over-the-counter  Pepcid, Tums, and more recently prescription ranitidine without relief.  Current symptoms include severe heartburn, regurgitation, and  intermittent solid food dysphagia.  Associated with this is epigastric  and lower chest discomfort.   She reports chronic stable constipation, for which she takes daily stool  softeners.  No lower abdominal pain, change in bowel habits from  baseline, melena, or hematochezia.  Her appetite and weight have been  stable.  She does report having undergone GI evaluation with Dr. Melina Copa  in [place] greater than 10 or 12 years ago.  At that time, she  underwent colonoscopy and upper endoscopy.  She bowel habits encouraged  by Dr. Osborne Casco to undergo follow up screening colonoscopy.   PAST MEDICAL HISTORY:  1. Hypertension.  2. Diabetes.  3. Hyperlipidemia.  4. Hypothyroidism.   PAST SURGICAL HISTORY:  1. Tonsillectomy.  2. Right ankle surgery.  3. Total left knee  replacement.  4. Lumbar back surgery.  5. Hysterectomy.  6. Subsequent bilateral salpingo-oophorectomy.   ALLERGIES:  EPINEPHRINE and MYCIN DERIVATIVES.   CURRENT MEDICATIONS:  1. Ranitidine 150 mg p.o. b.i.d.  2. Atenolol 50 mg daily.  3. Lyrica, unspecified dosage b.i.d.  4. Synthroid 50 mcg daily.  5. Actoplus/metformin 15/850 mg daily.   FAMILY HISTORY:  No family history of gastrointestinal malignancy.  Mother and sisters with diabetes.  Two sisters with breast cancer.  Two  siblings and father with heart disease.   SOCIAL HISTORY:  The patient is widowed with 2 children.  She lives  alone.  She has a 12th grade education.  She is retired, having  performed various sundry jobs, including daycare.  Currently, she helps  take care of her grandchildren.  She does not smoke or use alcohol.   REVIEW OF SYSTEMS:  Per diagnostic evaluation form.   PHYSICAL EXAM:  Well-appearing female in no acute distress.  Blood pressure 142/62, heart rate 74, weight 152.2 pounds.  She is 5  feet 1 inch in height.  HEENT:  Sclerae anicteric.  Conjunctivae pink.  Oral mucosa is intact.  There is no thrush.  There is no adenopathy.  Thyroid appears normal.  LUNGS:  Clear to auscultation and percussion.  HEART:  Regular.  ABDOMEN:  Obese and soft without tenderness, mass, or hernia.  Good  bowel sounds heard.  RECTAL:  Deferred.  EXTREMITIES:  Without edema.  NEUROLOGIC:  No focal deficit.   IMPRESSION:  1. Gastroesophageal reflux disease.  Marked exacerbation of symptoms      off proton pump inhibitor therapy.  2. Worsening intermittent solid food dysphagia, likely due to peptic      stricture exacerbated by esophageal inflammation and edema off      medication.  3. Epigastric and chest discomfort, as well, due to reflux.  4. Chronic stable constipation.  Stool softeners required.  5. Multiple medical problems, including diabetes mellitus.   RECOMMENDATIONS:  1. Reflux precautions with  attention to weight loss.  2. Initiate Prilosec OTC 20 mg daily.  Hopefully, this will be more      effective than ranitidine and it should be more affordable.  3. Schedule upper endoscopy with possible esophageal dilation.  The      nature of the procedure, as well as the risks, benefits, and      alternatives have been reviewed.  She understood and agreed to      proceed.  4. Continue stool softeners for constipation.  5. Schedule colonoscopy for colorectal neoplasia screening.  The      patient wanted to have this done concurrently with endoscopy for      her convenience.  The nature of the procedure, as well as the      risks, benefits, and alternatives have been reviewed.  She      understood and agreed to proceed.  6. Hold diabetic medication the day of her exam to reduce the risk of      unwanted hypoglycemia.  7. Samples of Prilosec OTC provided, as well as information on      colonoscopy, upper endoscopy, reflux disease, colon cancer, and      colon polyps, esophageal stricture, and esophageal dilation      provided and reviewed.  8. Ongoing general medical care with Dr. Osborne Casco.     Docia Chuck. Henrene Pastor, MD  Electronically Signed    JNP/MedQ  DD: 08/22/2007  DT: 08/22/2007  Job #: [id number]   cc:   Haywood Pao, M.D.

## 2011-03-12 NOTE — Discharge Summary (Signed)
NAMEBERNICE, [name]               ACCOUNT NO.:  [id number]   MEDICAL RECORD NO.:  [id number]          PATIENT TYPE:  INP   LOCATION:  [place]                         FACILITY:  [facility]   PHYSICIAN:  Gaynelle Arabian, M.D.    DATE OF BIRTH:  February 06, 1941   DATE OF ADMISSION:  11/19/2005  DATE OF DISCHARGE:  11/25/2005                           DISCHARGE SUMMARY - REFERRING   ADMISSION DIAGNOSES:  1.  Osteoarthritis, left knee.  2.  Mild episodic anxiety/depression.  3.  Past history of bronchitis.  4.  Hypertension.  5.  Hyperkalemia.  6.  Reflux disease.  7.  Past history of ulcers.  8.  Diverticulosis.  9.  Urinary incontinence.  10. Peripheral neuropathy.  11. Non-insulin-dependent diabetes mellitus.   DISCHARGE DIAGNOSES:  1.  Osteoarthritis, left knee, status post left total knee arthroplasty.  2.  Postoperative blood loss anemia, did not require transfusion.  3.  Mild episodic anxiety/depression.  4.  Hypertension.  5.  Hyperkalemia.  6.  Reflux disease.  7.  Past history of ulcers.  8.  Diverticulosis.  9.  Urinary incontinence.  10. Peripheral neuropathy.  11. Non-insulin-dependent diabetes mellitus.  12. Past history of bronchitis.   PROCEDURE:  November 19, 2005.   SURGEON:  Gaynelle Arabian, M.D.   ASSISTANT:  Mickel Crow, P.A.C.   ANESTHESIA:  Spinal.   TOURNIQUET TIME:  32 minutes.   CONSULTATIONS:  Rehabilitation Services.   HISTORY OF PRESENT ILLNESS:  [name] is a 70 year old female with severe  end-stage arthritis of the left knee, with bone on bone throughout.  Failed  nonoperative management.  Now presents for a total knee.   LABORATORY DATA:  Preoperative CBC:  Hemoglobin and hematocrit 11.9 and  33.7. Normal white count. Serial H&H's were followed. Dropped down to 8.2,  and came back up to 8.9. PT/PTT on admission 12.6 and 31 respectively. INR  0.9. Serial protime as follows:  PT/INR 27.9 and 2.6. Chem panel on  admission:  Elevated  potassium of 5.3. Remaining chem panel within normal  limits. Serial BMETs were followed. Potassium came down to normal level of  4.2. Remaining BMETs within normal limits. Preoperative day negative.   Chest x-ray:  Normal chest on November 15, 2005. Left humerus films:  Humerus  itself feels normal. One can appreciate calcifications distal to the humeral  head, that are probably a piece calcium of the rotator cuff tendons, loosely  associated with the radial head. That is just on the edge of the film and  could represent a radial head fracture. Elbow not examined.   HOSPITAL COURSE:  Admitted to [facility]  procedure well. Later transferred to the recovery room and to the orthopedic  floor. Started on PCA and p.o. analgesics for pain control following  surgery. Home medications were restarted. Placed on weightbearing as  tolerated. PT and OT were consulted for ambulation and ADLs. On day 1, was  doing pretty well with her pain. Hemovac drain was discontinued. She  underwent a rehab consult. The patient was seen in rehab service and felt to  be an appropriate  candidate for inpatient rehab [facility] stay. Therefore, it was  decided that she would transfer at which time a bed became available. By day  2, she started getting up and dressing was changed. Incision was healing  well. She started progressing with her physical therapy and could ambulate  approximately 60 feet by day 3. Hemoglobin had dropped down to 8.2, but she  was asymptomatic with this. She was placed on Trinsicon. Hemoglobin came  back at 8.9. She started progressing very well with physical therapy. It was  decided whether she would go to [facility] or rehab.  She as doing well on rounds  on November 25, 2005.  The patient was ready for transfer. We are awaiting  the final word if there is a bed available on [facility] versus [facility],  which was another bed offer. At the time of this dictation, we are  waiting  to hear from both, but the patient can be discharged.   DISCHARGE PLAN:  Patient discharged on November 25, 2005.   DISCHARGE DIAGNOSES:  Please see above.   DISCHARGE MEDICATIONS:  1.  Coumadin protocol.  Titrate Coumadin protocol for 3 weeks from date of      surgery. Titrate the INR between 2.0 and 3.0.  2.  Colace 100 mg p.o. b.i.d.  3.  Avandia 8 mg p.o. daily.  4.  Glucophage 1000 mg p.o. daily.  5.  Tenormin 50 mg daily.  6.  Neurontin 300 mg p.o. b.i.d.  7.  Nexium 40 mg p.o. daily.  8.  Trinsicon 1 p.o. t.i.d. x3 weeks.  9.  Percocet 1 or 2 every 4-6 hours as needed for pain.  10. Tylenol 1 to 2 every 4-6 hours as needed for mild pain or temporal      headache.  11. Robaxin 500 mg p.o. q.6 h. p.r.n. spasm.  12. Laxative of choice.  13. Enema of choice.   DIET:  Diabetic cardiac diet.   ACTIVITY:  She can be weightbearing as tolerated on the left lower  extremity. Gait training, ambulation, and ADL's as per PT and OT while on  either rehabilitation or inpatient unit. Total knee protocol. She may start  showering and daily dressing changes.   FOLLOW UP:  Two weeks from surgery. Contact the office at [phone] to  arrange an appointment at time of transfer of the patient, about 2 weeks  postoperative.   DISPOSITION:  Pending at the time of this dictation.   CONDITION ON DISCHARGE:  Improved.      Alexzandrew L. Dara Lords, P.A.      Gaynelle Arabian, M.D.  Electronically Signed    ALP/MEDQ  D:  11/24/2005  T:  11/24/2005  Job:  [id number]   cc:   Mardene Celeste A. Rocky Link, M.D.  Fax: [phone]

## 2011-03-12 NOTE — H&P (Signed)
[name], SIKKENGA               ACCOUNT NO.:  [id number]   MEDICAL RECORD NO.:  [id number]          PATIENT TYPE:  INP   LOCATION:  NA                           FACILITY:  [facility]   PHYSICIAN:  Kipp Brood. Gioffre, M.D.DATE OF BIRTH:  01/25/1941   DATE OF ADMISSION:  09/26/2006  DATE OF DISCHARGE:                                HISTORY & PHYSICAL   CHIEF COMPLAINT:  Lower back pain, weakness left leg.   HISTORY OF PRESENT ILLNESS:  This is a 71 year old female here today for  evaluation of her preoperative history and physical for her back and leg  history.  The patient has been having problems since I believe September  with lower back pain and weakness down into the left leg.  Evaluation showed  that she had a large facet synovial cyst at L4-5 on the left compressing the  L5 nerve root.  The patient has had injections in the past with a little bit  of improvement in discomfort and recently she has taken a trip to [place] where she said since that time she had some improvement in the lower  back discomfort but she still has weakness in the left lower extremity.  The  patient is going to proceed with a central decompression and excision of a  synovial cyst at L4-5 on the left.   ALLERGIES:  Mycins and sensitive to epinephrine.   CURRENT MEDICATIONS:  1. ActiPlus metformin one tablet a day.  2. Atenolol 50 mg a day.  3. Lyrica 75 mg two tablets a day.  4. Nexium one tablet a day.  5. Vitamins one tablet a day.  6. Advil p.r.n.   PAST MEDICAL HISTORY:  1. Hypertension.  2. Acid reflux disease.  3. History of ulcers and diverticulitis.  4. Diabetes.   PAST SURGICAL HISTORY:  1. Knee replacement in January 2007.  2. Ankle surgery in 1995.  3. The patient states the only complication she has had with anesthesia      with nausea.   PRIMARY CARE PHYSICIAN:  Dr. Fransico Him. Tisovec.   REVIEW OF SYSTEMS:  Negative for any cardiac, respiratory, GI, GU,  hematologic  histories at this time.   FAMILY HISTORY:  Mother is deceased from complications of CVA and MI.  Father is deceased from an MI.  The patient does have heart disease in her  father, brother and sisters.  Hypertension in her mother with diabetes.   SOCIAL HISTORY:  The patient is a widow. She is retired. She does not smoke  or use alcohol.  She has two grown children.  She will be living with her  daughter postoperatively.  She has a Psychologist, sport and exercise.   PHYSICAL EXAMINATION:  GENERAL:  The patient is a healthy-appearing 78-year-  old female, conscious, alert and appropriate.  She ambulates fairly easily,  gets on and off the exam table without any obvious discomfort.  VITAL SIGNS:  Height is 5 feet 1 inch.  Weight 147 pounds.  Blood pressure  142/80.  Pulse 70 and regular.  Respirations 12.  The patient is afebrile.  HEENT:  Head was normocephalic.  Pupils are equal, round and reactive.  Extraocular movements intact.  Oral mucosa is pink and moist.  NECK:  She had good range of motion of her cervical spine with rotation  right and left. She was rather slow on extension but she had full extension.  She was able to touch her chin to her chest.  No masses about the neck.  CHEST:  Lung sounds were clear and equal bilaterally.  No wheezes, rales or  rhonchi.  HEART:  Regular rate and rhythm.  No murmurs, rubs, or gallops.  ABDOMEN:  Round.  Bowel sounds present.  EXTREMITIES:  Upper extremities had good range of motion.  Good motor  strength.  Lower extremities: Right and left hip had full extension. She  could flex them up to 120 degrees with 20 degrees internal and external  rotation.  Left knee was round and boggy appearing.  Well-healed midline  incision.  She was able to fully extend it and flex it back to about 100  degrees.  Typical total knee replacement clunking.  No instability.  Right  knee was able to fully extend, flex back to 110 degrees.  No instability.  Calf was soft,  nontender.  Ankles were symmetrical at the dorsi plantar  flexion.  NEUROLOGIC: The patient was conscious, alert, and appropriate.  Good  historian.  She was intact to light touch and sensation in the lower  extremities.  She had a 4/5 motor strength at ankle extension and hip  flexors on the left as compared to 5/5 on the right.  BREAST/RECTAL/GU:  Deferred at this time.   IMPRESSION:  1. MRI-proven large facet osteophyte with synovial cyst at L4-5 on the      left with lateral recess stenosis.  2. History of hypertension.  3. Diabetes.  4. History of reflux disease and diverticulitis.  5. History of anemia.   PLAN:  The patient will undergo all routine labs and tests prior to having a  central decompression and excision of synovial cyst at L4-5 on the left by  Dr. Gladstone Lighter at [facility] on September 26, 2006.      Evert Kohl, P.A.    ______________________________  Kipp Brood Gladstone Lighter, M.D.    RWK/MEDQ  D:  09/20/2006  T:  09/20/2006  Job:  [id number]

## 2011-03-12 NOTE — H&P (Signed)
NAMEEXODUS, MCCREA               ACCOUNT NO.:  [id number]   MEDICAL RECORD NO.:  [id number]           PATIENT TYPE:   LOCATION:                                 FACILITY:   PHYSICIAN:  Gaynelle Arabian, M.D.         DATE OF BIRTH:   DATE OF ADMISSION:  11/19/2005  DATE OF DISCHARGE:                                HISTORY & PHYSICAL   DATE OF OFFICE VISIT HISTORY AND PHYSICAL:  November 11, 2005.   CHIEF COMPLAINT:  Left knee pain.   HISTORY OF PRESENT ILLNESS:  The patient is a 70 year old female who has  been seen by Dr. Wynelle Link for progressive left knee history.  She has had  pain for a year and a half.  It has progressively gotten worse.  She has had  an arthroscopy performed in [place] in Dr. Allene Dillon clinic, but since that  time her symptoms have progressively worsened, especially over the past 6-7  months.  She was told she needed a knee replacement and came in for a second  opinion.  She was seen by Dr. Wynelle Link and found to have bone-on-bone in the  medial compartment with patellofemoral involvement.  She has significant  arthritis interfering with what she can and cannot do.  She is felt to be a  candidate.  Risks and benefits discussed, and she has elected to proceed  with surgery.   ALLERGIES:  1.  CODEINE causes sickness.  2.  'MYCINS caused a reaction during childhood, which she believes to be a      rash.  3.  She did have EPINEPHRINE, which caused a drop in pressure during a      dental procedure.   CURRENT MEDICATIONS:  1.  Avandia 8 mg.  2.  Metformin 1000 mg.  3.  Atenolol 50 mg.  4.  Neurontin 100 mg.  5.  Baby aspirin 81 mg.  6.  Tylenol Arthritis.   PAST MEDICAL HISTORY:  1.  Episodic mild depression with some anxiety.  2.  History of bronchitis.  3.  Hypertension.  4.  Reflux disease.  5.  History of ulcers.  6.  Diverticulosis.  7.  Urinary incontinence.  8.  Peripheral neuropathy.  9.  Noninsulin-dependent diabetes mellitus.  10.  Hypercholesterolemia.   PAST SURGICAL HISTORY:  1.  Right ankle fracture pinning.  2.  Left knee arthroscopy.  3.  Hysterectomy.  4.  Colonoscopy.   SOCIAL HISTORY:  Nonsmoker.  No alcohol.  Has 2 children.  She lives alone.   FAMILY HISTORY:  Father deceased at age 9 with a history of heart disease.  Mother with a history of hypertension, diabetes, and stroke.  She has 3  sisters; all have undergone bypass surgery.  All of her brothers have heart  trouble.   REVIEW OF SYSTEMS:  GENERAL:  No fevers, chills or night sweats.  NEUROLOGIC:  History of mild episodic depression and anxiety.  No seizures,  syncope, paralysis.  RESPIRATORY:  No shortness of breath, productive cough  or hemoptysis.  CARDIOVASCULAR:  No chest pain, angina or  orthopnea.  GI:  She does have a history of diverticulosis with blood in the stool in the  past, but no recent flares.  Also some history of reflux.  GU:  Some  frequency.  No dysuria, hematuria or discharge.  MUSCULOSKELETAL:  Left  knee.   PHYSICAL EXAMINATION:  VITAL SIGNS:  Pulse 66, respirations 12, blood  pressure 142/62.  GENERAL:  A 70 year old white female, well-nourished, well-developed, in no  acute distress.  She is accompanied by her daughter.  She is alert,  oriented, cooperative and very pleasant.  HEENT:  Normocephalic and atraumatic.  Pupils equal, round and reactive to  light.  Oropharynx clear.  Extraocular movements intact.  NECK:  Supple.  CHEST:  She does have a faint expiratory wheeze in the right mid field.  Otherwise, the chest is clear, anterior and posterior chest walls.  HEART:  Regular rate and rhythm.  No murmur.  ABDOMEN:  Slightly round, nontender.  Bowel sounds present.  RECTAL/BREAST/GENITALIA:  Not done.  Not pertinent to present illness.  EXTREMITIES:  Left knee range of motion of 5 to 120 degrees.  Marked  crepitus is noted.  No effusion.   IMPRESSION:  1.  Osteoarthritis, left knee.  2.  Mild episodic anxiety  and depression.  3.  Past history of bronchitis.  4.  Hypertension.  5.  Hyperkalemia.  6.  Reflux disease.  7.  Past history of ulcers.  8.  Diverticulosis.  9.  Urinary incontinence.  10. Peripheral neuropathy.  11. Noninsulin-dependent diabetes mellitus.   PLAN:  The patient is admitted to [facility] to undergo a left  total knee arthroplasty.  Surgery will be performed by Dr. Gaynelle Arabian.  Dr. Rocky Link and Dr. Rex Kras are her physicians.  They will both be notified  of the room number on admission and be consulted if needed for medical  assistance with the patient throughout the hospital course.      Alexzandrew L. Dara Lords, P.A.      Gaynelle Arabian, M.D.  Electronically Signed    ALP/MEDQ  D:  11/18/2005  T:  11/19/2005  Job:  [id number]   cc:   Mardene Celeste A. Rocky Link, M.D.  Fax: Regent Little, M.D.  Fax: [phone]

## 2011-03-12 NOTE — Op Note (Signed)
[name], [name]               ACCOUNT NO.:  [id number]   MEDICAL RECORD NO.:  [id number]          PATIENT TYPE:  INP   LOCATION:  0006                         FACILITY:  [facility]   PHYSICIAN:  Gaynelle Arabian, M.D.    DATE OF BIRTH:  [birth year]   DATE OF PROCEDURE:  11/19/2005  DATE OF DISCHARGE:                                 OPERATIVE REPORT   PREOPERATIVE DIAGNOSIS:  Osteoarthritis left knee.   POSTOPERATIVE DIAGNOSIS:  Osteoarthritis left knee.   PROCEDURE:  Left total knee arthroplasty.   SURGEON:  Gaynelle Arabian, M.D.   ASSISTANT:  Alexzandrew L. Dara Lords, P.A.   ANESTHESIA:  Spinal.   ESTIMATED BLOOD LOSS:  Minimal.   DRAINS:  Hemovac x1.   TOURNIQUET TIME:  32 minutes at 300 mmHg.   COMPLICATIONS:  None.   CONDITION:  Stable to recovery.   BRIEF CLINICAL NOTE:  [name] is a 70 year old female who has severe end-  stage osteoarthritis of the left knee. She has bone-on-bone changes  throughout the knee. She has failed nonoperative management and presents now  for total knee arthroplasty.   PROCEDURE IN DETAIL:  After successful administration of spinal anesthetic,  a tourniquet was placed high on the left thigh and her left lower extremity  was prepped and draped in the usual sterile fashion. Extremity is wrapped in  Esmarch, knee flexed, tourniquet inflated to 300 mmHg. A midline incision is  made with a 10 blade through the subcutaneous tissue to the level of the  extensor mechanism. A fresh blade is used to make a medial parapatellar  arthrotomy and the soft tissue of the proximal medial tibia is  subperiosteally elevated to the joint line with a knife and into the  semimembranosus bursa with a Cobb elevator. The soft tissue over the  proximal lateral tibia is also elevated with attention being paid to  avoiding the patellar tendon on the tibial tubercle. The patella is everted,  knee flexed 90 degrees and the ACL and PCL removed. A drill was used to  create a starting hole in the distal femur and canal was irrigated. A 5  degree left valgus alignment guide was placed and referencing off the  posterior condyles rotations marked and the block pinned to remove 10 mm off  the distal femur. Distal femoral resection is made with an oscillating saw.  A sizing block is placed and a size 2.5 is the most appropriate. Rotation is  marked off the epicondylar axis and the size 2.5 cutting block is placed.  The,  anterior, posterior and chamfer cuts are made.   The tibia is subluxed forward and the menisci are removed. Extramedullary  tibial alignment guide is placed referencing proximally at the medial aspect  of the tibial tubercle and distally along the second metatarsal axis and  tibial crest. The block is pinned to remove 10 mm off the nondeficient  lateral side. Tibial resection is made with an oscillating saw. A size 2.5  is the most appropriate tibial component and the proximal tibia is prepared  with the modular drill and keel punch for a  size 2.5. Femoral preparation is  completed with the intercondylar cut.   The size 2.5 mobile bearing tibial trial and size 2.5 posterior stabilized  femoral trial and a 10 mm posterior stabilized rotating platform insert  trial are placed. With the 10, full extension is achieved with excellent  varus and valgus balance throughout full range of motion. The patella was  then everted and thickness measured to be 21 mm. Freehand resection taken to  11 mm, 32 template is placed, lug holes were drilled, trial patella was  placed and it tracks normally. The osteophytes are then removed off the  posterior femur with the trial in place. All trials are removed and the cut  bone surfaces prepared with pulsatile lavage. The cement was mixed and once  ready for implantation a size 2.5 mobile bearing tibial tray, size 2.5  posterior stabilized femur and 33 patella are cemented into place and the  patella is held with  a clamp. A trial 10 mm insert is placed, knee held in  full extension and all extruded cement removed. Once the cement is fully  hardened then the permanent 10 mm posterior stabilized rotating platform  insert is placed into the tibial tray. The wound was copiously irrigated  with saline solution and the extensor mechanism closed over a Hemovac drain  with interrupted #1 PDS. Flexion against gravity is 135 degrees. The  tourniquet is then released with a total time of 32 minutes. The subcu is  closed with interrupted 2-0 Vicryl, subcuticular with running 4-0 Monocryl.  The incision is cleaned and dried and Steri-Strips and a bulky sterile  dressing are applied. The drain is hooked to suction and she is placed into  a knee immobilizer, awakened, and transported to recovery in stable  condition.      Gaynelle Arabian, M.D.  Electronically Signed     FA/MEDQ  D:  11/19/2005  T:  11/20/2005  Job:  [id number]

## 2011-03-12 NOTE — Consult Note (Signed)
[facility]  Patient:    [name] [name]                        MRN: [id number] Proc. Date: 06/01/01 Attending:  Orlando Penner. London Pepper, M.D. CC:         Winifred Olive. Rocky Link, M.D. [facility], [place], [place]  [facility]   Consultation Report  HISTORY:  This late middle-aged white female is referred at the courtesy of the foot and ankle clinic in [facility] for evaluation and advise regarding numbness and pain in the feet of five to six years duration.  The patient has a history some 10 years ago of a fracture at the right ankle which was treated with internal fixation and which has been uncomplicated since.  It was actually prior to that time that she had begun to notice some tendency toward numbness in both toes and over the last 10 to 12 years, this has gradually progressed so that both the toes and the forefoot and the dorsum of the feet are generally numb and this is essentially continuous and not intermittent.  In recent months, there has become to be associated shooting pains in the feet which occur particularly at night and are lancinating in type and of short duration.  She was seen in the [facility] office and among other things, had an MRI of her right foot which did not show any significant evidence of pathology of Mortons neuroma and the like.  She has received no specific treatment for this and is referred here now for our further evaluation.  Of critical importance is the fact that the patient has been told that she has "borderline blood sugars" for a number of years.  She has been advised to avoid free sugars in her diet but was never given any more specific treatment for possible diabetes.  She does occasionally monitor her own blood sugars and finds these to be in the range of 135.  Her family history is studded with type 2 diabetes.  She is mildly overweight but certainly not strikingly so.  PAST MEDICAL HISTORY:   Really essentially uncomplicated aside for some hypertension, reflux esophagitis and history of depression.  ALLERGIES:  Her allergies include SULFA, MYCIN DRUGS and EPINEPHRINE.  REGULAR MEDICATIONS: 1. Premarin 0.625 mg daily. 2. Atenolol 50 mg daily. 3. Nexium 40 mg daily. 4. Ecotrin 81 mg daily.  EXAMINATION:  EXTREMITIES:  Examination today is limited to the distal lower extremities. The feet are essentially symmetrical and free of gross deformity.  Skin temperatures are normal and symmetrical throughout.  Pulses are palpable in both feet at all locations and appear adequate, as does capillary filling. There is no significant edema.  Underlying the second metatarsal heads on the plantar surfaces of each foot are callused areas that are not hemorrhagic or otherwise complicated.  Monofilament testing shows that she lacks protective sensation in the toes and in the metatarsal head areas, where as the remainder of the foot, though diminished in its sensory abilities, does have protective sensation.  IMPRESSION: 1. Diabetes mellitus, type 2, currently controlled by diet alone, with    advancing peripheral neuropathy and classic neuropathic symptoms. 2. Loss of the anterior transverse metatarsal arch with callus formation    underlying the second metatarsal heads bilaterally.  DISPOSITION: 1. The patient and her daughter are given general instruction regarding    diabetes foot care by video instruction, with nurse and physician  reinforcement. 2. The patients shoes are assessed and appear to be marginal in adequacy. 3. The callused areas underlying the second metatarsal heads bilaterally are    sharply pared without incident. 4. The patient is given a prescription for custom-molded diabetic-type    inserts, which she states that she will obtain, and she is advised to take    all the shoes that she ordinarily wears to the pedorthotist at that time    for assessment of their  adequacy and for replacement as necessary. 5. The patient is begun on Neurontin 200 mg twice daily, which is gradually to    be increased to 400 mg twice daily, in hopes this will help her symptoms.    She is warned that larger doses may ultimately be necessary and that she    should be patient time-wise in expecting response to this drug. 6. The patient is given a followup visit here for three weeks. DD:  06/01/01 TD:  06/01/01 Job: [id number]

## 2011-03-12 NOTE — Op Note (Signed)
NAMEALANYS, [name]               ACCOUNT NO.:  [id number]   MEDICAL RECORD NO.:  [id number]          PATIENT TYPE:  INP   LOCATION:  1509                         [facility]ACILITY[facility]  [facility]   PHYSICIAN:  Kipp Brood. Gioffre, M.D.DATE OF BIRTH:  04/26/1941   DATE OF PROCEDURE:  09/26/2006  DATE OF DISCHARGE:                               OPERATIVE REPORT   SURGEON:  Kipp Brood. Gladstone Lighter, M.D.   ASSISTANT:  Tarri Glenn, M.D.   PREOPERATIVE DIAGNOSIS:  1. Spinal stenosis at L4-L5.  2. Large synovial cyst at L4-L5 on the left and central.   POSTOPERATIVE DIAGNOSIS:  1. Spinal stenosis at L4-L5.  2. Large synovial cyst at L4-L5 on the left and central.   OPERATION:  1. Complete decompressive lumbar laminectomy at L4-L5, partial      facetectomy at L4-L5 on the left.  2. Excision of a large synovial cyst at L4-L5 on the left.   PROCEDURE:  Under general anesthesia, routine orthopedic prep and drape  of the lower back was carried out.  The patient had 1 gram of IV Ancef  preop.  At this time, two needles were placed in the back for  localization purposes and an x-ray was taken.  A second x-ray was taken  to verify our final position.  Following that, we excised the spinous  processes of L4, partial of L5, went down and did a complete  decompressive lumbar laminectomy at L4-L5 with great care taken not to  injure the underlying dura.  We then removed the ligamentum flavum while  we protected the dura.  The microscope was brought in for this stage of  the procedure.  Following that, we then went out and did a partial  facetectomy on the left.  We identified a large synovial cyst, it was  adhesed down to the dura.  We freed up as much as we safely could,  finally uncover the L5 root, had total freedom of the root, did a  foraminotomy, and we were now easily able to follow the root out the  foramen.  The area was explored proximally and distally with a hockey-  stick to make sure that the canal  was opened, it was definitely opened  now in all directions.  We thoroughly irrigated out the area.  Good  hemostasis was maintained using thrombin-soaked Gelfoam.  Following  that, I closed the deep part of the wound with number 1 Vicryl suture.  A small proximal deep portion of the wound was left open for drainage  purposes.  The subcu was closed with 0 Vicryl, skin was closed with  metal staples and a sterile Neosporin dressing was applied.           ______________________________  Kipp Brood Gladstone Lighter, M.D.     RAG/MEDQ  D:  09/26/2006  T:  09/26/2006  Job:  [id number]

## 2011-12-01 ENCOUNTER — Other Ambulatory Visit: Payer: Self-pay

## 2011-12-01 ENCOUNTER — Encounter (HOSPITAL_COMMUNITY): Payer: Self-pay | Admitting: *Deleted

## 2011-12-01 ENCOUNTER — Inpatient Hospital Stay (HOSPITAL_COMMUNITY)
Admission: EM | Admit: 2011-12-01 | Discharge: 2011-12-10 | DRG: 234 | Disposition: A | Discharge status: Home Health | Payer: Medicare Other | Attending: Cardiothoracic Surgery | Admitting: Cardiothoracic Surgery

## 2011-12-01 ENCOUNTER — Emergency Department (HOSPITAL_COMMUNITY): Payer: Medicare Other

## 2011-12-01 DIAGNOSIS — Z7982 Long term (current) use of aspirin: Secondary | ICD-10-CM

## 2011-12-01 DIAGNOSIS — I214 Non-ST elevation (NSTEMI) myocardial infarction: Principal | ICD-10-CM | POA: Diagnosis present

## 2011-12-01 DIAGNOSIS — I10 Essential (primary) hypertension: Secondary | ICD-10-CM | POA: Diagnosis present

## 2011-12-01 DIAGNOSIS — D696 Thrombocytopenia, unspecified: Secondary | ICD-10-CM | POA: Diagnosis not present

## 2011-12-01 DIAGNOSIS — I2510 Atherosclerotic heart disease of native coronary artery without angina pectoris: Secondary | ICD-10-CM | POA: Diagnosis present

## 2011-12-01 DIAGNOSIS — Z951 Presence of aortocoronary bypass graft: Secondary | ICD-10-CM

## 2011-12-01 DIAGNOSIS — E8779 Other fluid overload: Secondary | ICD-10-CM | POA: Diagnosis not present

## 2011-12-01 DIAGNOSIS — K219 Gastro-esophageal reflux disease without esophagitis: Secondary | ICD-10-CM | POA: Diagnosis present

## 2011-12-01 DIAGNOSIS — E785 Hyperlipidemia, unspecified: Secondary | ICD-10-CM | POA: Diagnosis present

## 2011-12-01 DIAGNOSIS — I4891 Unspecified atrial fibrillation: Secondary | ICD-10-CM | POA: Diagnosis not present

## 2011-12-01 DIAGNOSIS — I519 Heart disease, unspecified: Secondary | ICD-10-CM | POA: Diagnosis not present

## 2011-12-01 DIAGNOSIS — N289 Disorder of kidney and ureter, unspecified: Secondary | ICD-10-CM | POA: Diagnosis not present

## 2011-12-01 DIAGNOSIS — Z96659 Presence of unspecified artificial knee joint: Secondary | ICD-10-CM

## 2011-12-01 DIAGNOSIS — K5900 Constipation, unspecified: Secondary | ICD-10-CM | POA: Diagnosis not present

## 2011-12-01 DIAGNOSIS — IMO0001 Reserved for inherently not codable concepts without codable children: Secondary | ICD-10-CM | POA: Diagnosis present

## 2011-12-01 DIAGNOSIS — E119 Type 2 diabetes mellitus without complications: Secondary | ICD-10-CM | POA: Diagnosis present

## 2011-12-01 DIAGNOSIS — R110 Nausea: Secondary | ICD-10-CM | POA: Diagnosis not present

## 2011-12-01 DIAGNOSIS — Z8249 Family history of ischemic heart disease and other diseases of the circulatory system: Secondary | ICD-10-CM

## 2011-12-01 DIAGNOSIS — D62 Acute posthemorrhagic anemia: Secondary | ICD-10-CM | POA: Diagnosis not present

## 2011-12-01 DIAGNOSIS — Y832 Surgical operation with anastomosis, bypass or graft as the cause of abnormal reaction of the patient, or of later complication, without mention of misadventure at the time of the procedure: Secondary | ICD-10-CM | POA: Diagnosis not present

## 2011-12-01 DIAGNOSIS — Z79899 Other long term (current) drug therapy: Secondary | ICD-10-CM

## 2011-12-01 DIAGNOSIS — E1169 Type 2 diabetes mellitus with other specified complication: Secondary | ICD-10-CM | POA: Diagnosis present

## 2011-12-01 DIAGNOSIS — Y9210 Unspecified residential institution as the place of occurrence of the external cause: Secondary | ICD-10-CM | POA: Diagnosis not present

## 2011-12-01 HISTORY — DX: Unspecified osteoarthritis, unspecified site: M19.90

## 2011-12-01 HISTORY — DX: Gastro-esophageal reflux disease without esophagitis: K21.9

## 2011-12-01 HISTORY — DX: Pneumonia, unspecified organism: J18.9

## 2011-12-01 HISTORY — DX: Other specified postprocedural states: R11.2

## 2011-12-01 HISTORY — DX: Presence of aortocoronary bypass graft: Z95.1

## 2011-12-01 HISTORY — DX: Major depressive disorder, single episode, unspecified: F32.9

## 2011-12-01 HISTORY — DX: Depression, unspecified: F32.A

## 2011-12-01 HISTORY — DX: Hypothyroidism, unspecified: E03.9

## 2011-12-01 HISTORY — DX: Acute myocardial infarction, unspecified: I21.9

## 2011-12-01 HISTORY — DX: Other specified postprocedural states: Z98.890

## 2011-12-01 HISTORY — DX: Essential (primary) hypertension: I10

## 2011-12-01 HISTORY — DX: Non-ST elevation (NSTEMI) myocardial infarction: I21.4

## 2011-12-01 LAB — COMPREHENSIVE METABOLIC PANEL
ALT: 15 U/L (ref 0–35)
AST: 25 U/L (ref 0–37)
Albumin: 3.7 g/dL (ref 3.5–5.2)
Alkaline Phosphatase: 68 U/L (ref 39–117)
BUN: 14 mg/dL (ref 6–23)
CO2: 23 mEq/L (ref 19–32)
Calcium: 10 mg/dL (ref 8.4–10.5)
Chloride: 106 mEq/L (ref 96–112)
Creatinine, Ser: 0.92 mg/dL (ref 0.50–1.10)
GFR calc Af Amer: 71 mL/min — ABNORMAL LOW (ref >90)
GFR calc non Af Amer: 61 mL/min — ABNORMAL LOW (ref >90)
Glucose, Bld: 154 mg/dL — ABNORMAL HIGH (ref 70–99)
Potassium: 4.2 mEq/L (ref 3.5–5.1)
Sodium: 141 mEq/L (ref 135–145)
Total Bilirubin: 0.3 mg/dL (ref 0.3–1.2)
Total Protein: 7 g/dL (ref 6.0–8.3)

## 2011-12-01 LAB — BASIC METABOLIC PANEL
BUN: 15 mg/dL (ref 6–23)
CO2: 24 mEq/L (ref 19–32)
Calcium: 9.7 mg/dL (ref 8.4–10.5)
Chloride: 105 mEq/L (ref 96–112)
Creatinine, Ser: 0.91 mg/dL (ref 0.50–1.10)
GFR calc Af Amer: 72 mL/min — ABNORMAL LOW (ref >90)
GFR calc non Af Amer: 62 mL/min — ABNORMAL LOW (ref >90)
Glucose, Bld: 225 mg/dL — ABNORMAL HIGH (ref 70–99)
Potassium: 4.3 mEq/L (ref 3.5–5.1)
Sodium: 141 mEq/L (ref 135–145)

## 2011-12-01 LAB — CBC
HCT: 34.7 % — ABNORMAL LOW (ref 36.0–46.0)
HCT: 34.2 % — ABNORMAL LOW (ref 36.0–46.0)
Hemoglobin: 12.5 g/dL (ref 12.0–15.0)
Hemoglobin: 12.2 g/dL (ref 12.0–15.0)
MCH: 33.1 pg (ref 26.0–34.0)
MCH: 32.9 pg (ref 26.0–34.0)
MCHC: 36 g/dL (ref 30.0–36.0)
MCHC: 35.7 g/dL (ref 30.0–36.0)
MCV: 91.8 fL (ref 78.0–100.0)
MCV: 92.2 fL (ref 78.0–100.0)
Platelets: 199 10*3/uL (ref 150–400)
Platelets: 199 10*3/uL (ref 150–400)
RBC: 3.78 MIL/uL — ABNORMAL LOW (ref 3.87–5.11)
RBC: 3.71 MIL/uL — ABNORMAL LOW (ref 3.87–5.11)
RDW: 12.8 % (ref 11.5–15.5)
RDW: 12.8 % (ref 11.5–15.5)
WBC: 11.7 10*3/uL — ABNORMAL HIGH (ref 4.0–10.5)
WBC: 10.1 10*3/uL (ref 4.0–10.5)

## 2011-12-01 LAB — DIFFERENTIAL
Basophils Absolute: 0 10*3/uL (ref 0.0–0.1)
Basophils Relative: 0 % (ref 0–1)
Eosinophils Absolute: 0.2 10*3/uL (ref 0.0–0.7)
Eosinophils Relative: 2 % (ref 0–5)
Lymphocytes Relative: 38 % (ref 12–46)
Lymphs Abs: 3.8 10*3/uL (ref 0.7–4.0)
Monocytes Absolute: 0.7 10*3/uL (ref 0.1–1.0)
Monocytes Relative: 7 % (ref 3–12)
Neutro Abs: 5.4 10*3/uL (ref 1.7–7.7)
Neutrophils Relative %: 54 % (ref 43–77)

## 2011-12-01 LAB — PROTIME-INR
INR: 0.99 (ref 0.00–1.49)
Prothrombin Time: 13.3 seconds (ref 11.6–15.2)

## 2011-12-01 LAB — CARDIAC PANEL(CRET KIN+CKTOT+MB+TROPI)
CK, MB: 5.6 ng/mL — ABNORMAL HIGH (ref 0.3–4.0)
Relative Index: 4.3 — ABNORMAL HIGH (ref 0.0–2.5)
Total CK: 129 U/L (ref 7–177)
Troponin I: 1.22 ng/mL (ref <0.30)

## 2011-12-01 LAB — APTT: aPTT: 71 seconds — ABNORMAL HIGH (ref 24–37)

## 2011-12-01 LAB — MAGNESIUM: Magnesium: 1.6 mg/dL (ref 1.5–2.5)

## 2011-12-01 LAB — POCT I-STAT TROPONIN I: Troponin i, poc: 0.48 ng/mL (ref 0.00–0.08)

## 2011-12-01 LAB — MRSA PCR SCREENING: MRSA by PCR: NEGATIVE

## 2011-12-01 LAB — GLUCOSE, CAPILLARY: Glucose-Capillary: 158 mg/dL — ABNORMAL HIGH (ref 70–99)

## 2011-12-01 LAB — SALICYLATE LEVEL: Salicylate Lvl: <2 mg/dL — ABNORMAL LOW (ref 2.8–20.0)

## 2011-12-01 LAB — PRO B NATRIURETIC PEPTIDE: Pro B Natriuretic peptide (BNP): 4068 pg/mL — ABNORMAL HIGH (ref 0–125)

## 2011-12-01 MED ORDER — ASPIRIN 81 MG PO CHEW
243.0000 mg | CHEWABLE_TABLET | Freq: Once | ORAL | Status: AC
  Administered 2011-12-01: 243 mg via ORAL
  Filled 2011-12-01: qty 3

## 2011-12-01 MED ORDER — ADULT MULTIVITAMIN W/MINERALS CH
1.0000 | ORAL_TABLET | Freq: Every day | ORAL | Status: DC
  Administered 2011-12-03: 1 via ORAL
  Filled 2011-12-01 (×3): qty 1

## 2011-12-01 MED ORDER — VITAMIN C 500 MG/5ML PO SYRP
500.0000 mg | ORAL_SOLUTION | Freq: Every day | ORAL | Status: DC
  Filled 2011-12-01: qty 5

## 2011-12-01 MED ORDER — ROSUVASTATIN CALCIUM 20 MG PO TABS
20.0000 mg | ORAL_TABLET | Freq: Every day | ORAL | Status: DC
  Administered 2011-12-03 – 2011-12-06 (×2): 20 mg via ORAL
  Filled 2011-12-01 (×5): qty 1

## 2011-12-01 MED ORDER — NITROGLYCERIN 0.4 MG SL SUBL
0.4000 mg | SUBLINGUAL_TABLET | SUBLINGUAL | Status: DC | PRN
  Filled 2011-12-01: qty 25

## 2011-12-01 MED ORDER — SODIUM CHLORIDE 0.9 % IJ SOLN
3.0000 mL | Freq: Two times a day (BID) | INTRAMUSCULAR | Status: DC
  Administered 2011-12-02 – 2011-12-03 (×3): 3 mL via INTRAVENOUS

## 2011-12-01 MED ORDER — LEVOTHYROXINE SODIUM 50 MCG PO TABS
50.0000 ug | ORAL_TABLET | ORAL | Status: DC
  Administered 2011-12-02 – 2011-12-10 (×4): 50 ug via ORAL
  Filled 2011-12-01 (×5): qty 1

## 2011-12-01 MED ORDER — PANTOPRAZOLE SODIUM 40 MG PO TBEC
40.0000 mg | DELAYED_RELEASE_TABLET | Freq: Every day | ORAL | Status: DC
  Administered 2011-12-03: 40 mg via ORAL
  Filled 2011-12-01: qty 1

## 2011-12-01 MED ORDER — MECLIZINE HCL 25 MG PO TABS
25.0000 mg | ORAL_TABLET | Freq: Four times a day (QID) | ORAL | Status: DC | PRN
  Administered 2011-12-03 – 2011-12-06 (×3): 25 mg via ORAL
  Filled 2011-12-01 (×3): qty 1

## 2011-12-01 MED ORDER — SODIUM CHLORIDE 0.9 % IV SOLN
INTRAVENOUS | Status: DC
  Administered 2011-12-02: 04:00:00 via INTRAVENOUS

## 2011-12-01 MED ORDER — ZOLPIDEM TARTRATE 5 MG PO TABS: 5.0000 mg | ORAL_TABLET | Freq: Every evening | ORAL | Status: DC | PRN

## 2011-12-01 MED ORDER — INSULIN ASPART 100 UNIT/ML ~~LOC~~ SOLN
0.0000 [IU] | Freq: Three times a day (TID) | SUBCUTANEOUS | Status: DC
  Administered 2011-12-02: 3 [IU] via SUBCUTANEOUS
  Administered 2011-12-03 (×3): 2 [IU] via SUBCUTANEOUS
  Filled 2011-12-01: qty 3

## 2011-12-01 MED ORDER — ASPIRIN EC 81 MG PO TBEC
81.0000 mg | DELAYED_RELEASE_TABLET | Freq: Every day | ORAL | Status: DC
  Administered 2011-12-03: 81 mg via ORAL
  Filled 2011-12-01 (×3): qty 1

## 2011-12-01 MED ORDER — SODIUM CHLORIDE 0.9 % IJ SOLN: 3.0000 mL | INTRAMUSCULAR | Status: DC | PRN

## 2011-12-01 MED ORDER — HEPARIN SOD (PORCINE) IN D5W 100 UNIT/ML IV SOLN: 1000.0000 [IU]/h | INTRAVENOUS | Status: DC

## 2011-12-01 MED ORDER — NITROGLYCERIN 2 % TD OINT: 0.5000 [in_us] | TOPICAL_OINTMENT | Freq: Four times a day (QID) | TRANSDERMAL | Status: DC

## 2011-12-01 MED ORDER — METOPROLOL TARTRATE 12.5 MG HALF TABLET
12.5000 mg | ORAL_TABLET | Freq: Two times a day (BID) | ORAL | Status: DC
  Administered 2011-12-01: 12.5 mg via ORAL
  Filled 2011-12-01 (×3): qty 1

## 2011-12-01 MED ORDER — ACETAMINOPHEN 500 MG PO TABS
1000.0000 mg | ORAL_TABLET | Freq: Four times a day (QID) | ORAL | Status: DC | PRN
  Filled 2011-12-01: qty 2

## 2011-12-01 MED ORDER — LISINOPRIL 20 MG PO TABS
20.0000 mg | ORAL_TABLET | Freq: Every day | ORAL | Status: DC
  Administered 2011-12-01 – 2011-12-03 (×3): 20 mg via ORAL
  Filled 2011-12-01 (×4): qty 1

## 2011-12-01 MED ORDER — NITROGLYCERIN 0.2 MG/HR TD PT24
0.2000 mg | MEDICATED_PATCH | Freq: Every day | TRANSDERMAL | Status: DC
  Administered 2011-12-01: 0.2 mg via TRANSDERMAL
  Filled 2011-12-01 (×2): qty 1

## 2011-12-01 MED ORDER — GLIMEPIRIDE 4 MG PO TABS
4.0000 mg | ORAL_TABLET | Freq: Two times a day (BID) | ORAL | Status: DC
  Administered 2011-12-01 – 2011-12-10 (×15): 4 mg via ORAL
  Filled 2011-12-01 (×19): qty 1

## 2011-12-01 MED ORDER — HEPARIN BOLUS VIA INFUSION: 4000.0000 [IU] | Freq: Once | INTRAVENOUS | Status: DC

## 2011-12-01 MED ORDER — ALPRAZOLAM 0.25 MG PO TABS
0.2500 mg | ORAL_TABLET | Freq: Two times a day (BID) | ORAL | Status: DC | PRN
  Filled 2011-12-01: qty 1

## 2011-12-01 MED ORDER — SODIUM CHLORIDE 0.9 % IV SOLN: 250.0000 mL | INTRAVENOUS | Status: DC | PRN

## 2011-12-01 MED ORDER — GABAPENTIN 300 MG PO CAPS
300.0000 mg | ORAL_CAPSULE | Freq: Three times a day (TID) | ORAL | Status: DC
  Administered 2011-12-01 – 2011-12-07 (×8): 300 mg via ORAL
  Filled 2011-12-01 (×19): qty 1

## 2011-12-01 MED ORDER — ASPIRIN EC 81 MG PO TBEC
81.0000 mg | DELAYED_RELEASE_TABLET | Freq: Once | ORAL | Status: AC
  Administered 2011-12-01: 81 mg via ORAL
  Filled 2011-12-01: qty 1

## 2011-12-01 MED ORDER — INSULIN ASPART 100 UNIT/ML ~~LOC~~ SOLN
0.0000 [IU] | Freq: Every day | SUBCUTANEOUS | Status: DC
  Administered 2011-12-02: 2 [IU] via SUBCUTANEOUS

## 2011-12-01 MED ORDER — QUINAPRIL HCL 10 MG PO TABS: 20.0000 mg | ORAL_TABLET | Freq: Every day | ORAL | Status: DC

## 2011-12-01 MED ORDER — ASPIRIN 325 MG PO TABS: 325.0000 mg | ORAL_TABLET | ORAL | Status: DC

## 2011-12-01 MED ORDER — HEPARIN SOD (PORCINE) IN D5W 100 UNIT/ML IV SOLN
750.0000 [IU]/h | INTRAVENOUS | Status: AC
  Administered 2011-12-01: 750 [IU]/h via INTRAVENOUS
  Filled 2011-12-01: qty 250

## 2011-12-01 MED ORDER — HEPARIN BOLUS VIA INFUSION
3600.0000 [IU] | Freq: Once | INTRAVENOUS | Status: AC
  Administered 2011-12-01: 3600 [IU] via INTRAVENOUS

## 2011-12-01 MED ORDER — HEPARIN (PORCINE) IN NACL 100-0.45 UNIT/ML-% IJ SOLN: 14.0000 [IU]/kg/h | Freq: Once | INTRAMUSCULAR | Status: DC

## 2011-12-01 MED ORDER — DIAZEPAM 5 MG PO TABS
5.0000 mg | ORAL_TABLET | ORAL | Status: AC
  Administered 2011-12-02: 5 mg via ORAL
  Filled 2011-12-01: qty 1

## 2011-12-01 MED ORDER — ONDANSETRON HCL 4 MG/2ML IJ SOLN: 4.0000 mg | Freq: Four times a day (QID) | INTRAMUSCULAR | Status: DC | PRN

## 2011-12-01 MED ORDER — SODIUM CHLORIDE 0.9 % IJ SOLN: 3.0000 mL | Freq: Two times a day (BID) | INTRAMUSCULAR | Status: DC

## 2011-12-01 NOTE — ED Notes (Signed)
Pt ambulated to and from restroom without difficulty, steady gait

## 2011-12-01 NOTE — ED Notes (Signed)
I-stat troponin 0.48, Dr.Zackowski aware

## 2011-12-01 NOTE — ED Notes (Signed)
Cardiology at bedside consulting with pt and family

## 2011-12-01 NOTE — Progress Notes (Signed)
ANTICOAGULATION CONSULT NOTE - Initial Consult  Pharmacy Consult for Heparin Indication: chest pain/ACS  Assessment: 71 yo female who presents to the ED with chest heaviness associated with nausea, diaphoresis, SOB, and dizziness. Patient has an elevated BNP and troponin.  Spoke with patient and answered all questions about heparin therapy.  Goal of Therapy:  Heparin level 0.3-0.7 units/ml   Plan:  1. Heparin 3600 units IV bolus then 750 units/hr (7.5 ml/hr) 2. Heparin level 8 hrs after started 3. Daily heparin level and CBC 4. Follow-up height and weight taken on the floor    Allergies  Allergen Reactions  . Epinephrine Other (See Comments)    Abnormal feeling    Estimated Patient Measurements: Height: 61" Weight: 61.4 kg Heparin Dosing Weight: 60.2  Vital Signs: Temp: 98.1 F (36.7 C) (02/06 1418) Temp src: Oral (02/06 1418) BP: 145/78 mmHg (02/06 1600) Pulse Rate: 73  (02/06 1600)  Labs:  Basename 12/01/11 1306  HGB 12.5  HCT 34.7*  PLT 199  APTT --  LABPROT --  INR --  HEPARINUNFRC --  CREATININE 0.91  CKTOTAL --  CKMB --  TROPONINI --   The CrCl is unknown because both a height and weight (above a minimum accepted value) are required for this calculation.  Medical History: Past Medical History  Diagnosis Date  . Diabetes mellitus   . Hypertension   . GERD (gastroesophageal reflux disease)   . Thyroid disease   . Neuropathy     Medications:  Scheduled:    . aspirin  243 mg Oral Once  . heparin  750 Units/hr Intravenous To Minor  . heparin  3,600 Units Intravenous Once  . DISCONTD: aspirin  325 mg Oral STAT  . DISCONTD: heparin  14 Units/kg/hr Intravenous Once  . DISCONTD: heparin  4,000 Units Intravenous Once   Infusions:    Pati Gallo 12/01/2011,4:12 PM

## 2011-12-01 NOTE — H&P (Addendum)
Patient ID: [name] MRN: [id number], DOB/AGE: January 29, 1941   Admit date: 12/01/2011   Primary Physician: [name] Pao, MD, MD Primary Cardiologist: Dr [name]  HPI: [name] is a 71 year old female seen in the past by Dr. little. She has multiple cardiac risk factors. She's had chest pain off-and-on over the years. She's had Myoview studies in the past. Her last Myoview was in 2008 and was negative for ischemia with good LV function. On Monday prior to this admission she developed some chest heaviness with some vague shortness of breath which radiated up into her neck. This was right before bed. Associated with some nausea but no diaphoresis. Symptoms resolve spontaneously. Next day she felt some discomfort off and on in her chest. Today on the day of admission she walked out to get her paper and on the way back develop chest heaviness which was severe radiated to her neck and throat. It lasted about 15 minutes. It was associated with shortness of breath. She was better after she rested. Her daughter brought her to the emergency room for further evaluation. In the emergency room her initial troponin is trace positive at 0.4. Her EKG shows no acute changes. She's admitted now with the  NonST elevation MI. She's currently pain-free.   Problem List: Past Medical History  Diagnosis Date  . Diabetes mellitus   . Hypertension   . GERD (gastroesophageal reflux disease)   . Thyroid disease   . Neuropathy     Past Surgical History  Procedure Date  . Knee surgery   . Back surgery   . Foot surgery      Allergies:  Allergies  Allergen Reactions  . Epinephrine Other (See Comments)    Abnormal feeling     Home Medications -- pt brought medications -- will be updated in computer; reviewed. Prior to Admission medications   Medication Sig Start Date End Date Taking? Authorizing Provider  acetaminophen (TYLENOL) 500 MG tablet Take 1,000 mg by mouth every 6 (six) hours as needed. pain    Yes Historical Provider, MD  Ascorbic Acid (VITAMIN C PO) Take 1 tablet by mouth daily.   Yes Historical Provider, MD  aspirin EC 81 MG tablet Take 81 mg by mouth once.   Yes Historical Provider, MD  CALCIUM PO Take 1 tablet by mouth daily.   Yes Historical Provider, MD  Cholecalciferol (VITAMIN D PO) Take 2 tablets by mouth daily.   Yes Historical Provider, MD  Cyanocobalamin (VITAMIN B 12 PO) Take 1 tablet by mouth daily.   Yes Historical Provider, MD  esomeprazole (NEXIUM) 40 MG capsule Take 40 mg by mouth daily before breakfast.   Yes Historical Provider, MD  gabapentin (NEURONTIN) 300 MG capsule Take 300 mg by mouth 3 (three) times daily.   Yes Historical Provider, MD  glimepiride (AMARYL) 4 MG tablet Take 4 mg by mouth 2 (two) times daily.   Yes Historical Provider, MD  levothyroxine (SYNTHROID, LEVOTHROID) 50 MCG tablet Take 50 mcg by mouth every other day.   Yes Historical Provider, MD  meclizine (ANTIVERT) 25 MG tablet Take 25 mg by mouth 4 (four) times daily as needed.   Yes Historical Provider, MD  Multiple Vitamin (MULITIVITAMIN WITH MINERALS) TABS Take 1 tablet by mouth daily.   Yes Historical Provider, MD  quinapril (ACCUPRIL) 20 MG tablet Take 20 mg by mouth daily.   Yes Historical Provider, MD  sitaGLIPtan-metformin (JANUMET) 50-1000 MG per tablet Take 1 tablet by mouth 2 (two) times daily with a  meal.   Yes Historical Provider, MD   No family history on file. - Several siblings with CAD  History   Social History  . Marital Status: Widowed    Spouse Name: N/A    Number of Children: N/A  . Years of Education: N/A   Occupational History  . Not on file.   Social History Main Topics  . Smoking status: Not on file  . Smokeless tobacco: Not on file  . Alcohol Use: No  . Drug Use:   . Sexually Active:    Other Topics Concern  . Not on file   Social History Narrative  . No narrative on file     Review of Systems: General: negative for chills, fever, night sweats or  weight changes.  Cardiovascular: negative for chest pain, dyspnea on exertion, edema, orthopnea, palpitations, paroxysmal nocturnal dyspnea or shortness of breath Dermatological: negative for rash Respiratory: negative for cough or wheezing Urologic: negative for hematuria Abdominal: negative for nausea, vomiting, diarrhea, bright red blood per rectum, melena, or hematemesis Neurologic: negative for visual changes, syncope, or dizziness All other systems reviewed and are otherwise negative except as noted above.  Physical Exam: Blood pressure 166/65, pulse 78, temperature 98.1 F (36.7 C), temperature source Oral, resp. rate 18, SpO2 100.00%.  General appearance: alert, cooperative and no distress Neck: no adenopathy, no carotid bruit, no JVD, supple, symmetrical, trachea midline and thyroid not enlarged, symmetric, no tenderness/mass/nodules Lungs: clear to auscultation bilaterally Heart: regular rate and rhythm Abdomen: soft, non-tender; bowel sounds normal; no masses,  no organomegaly Extremities: extremities normal, atraumatic, no cyanosis or edema Pulses: 2+ and symmetric Skin: Skin color, texture, turgor normal. No rashes or lesions Neurologic: Grossly normal; CN II- XII grossly intact, strength 5/5 with normal reflexes throughout. - non-focal.   Labs:   Results for orders placed during the hospital encounter of 12/01/11 (from the past 24 hour(s))  CBC     Status: Abnormal   Collection Time   12/01/11  1:06 PM      Component Value Range   WBC 11.7 (*) 4.0 - 10.5 (K/uL)   RBC 3.78 (*) 3.87 - 5.11 (MIL/uL)   Hemoglobin 12.5  12.0 - 15.0 (g/dL)   HCT 34.7 (*) 36.0 - 46.0 (%)   MCV 91.8  78.0 - 100.0 (fL)   MCH 33.1  26.0 - 34.0 (pg)   MCHC 36.0  30.0 - 36.0 (g/dL)   RDW 12.8  11.5 - 15.5 (%)   Platelets 199  150 - 400 (K/uL)  BASIC METABOLIC PANEL     Status: Abnormal   Collection Time   12/01/11  1:06 PM      Component Value Range   Sodium 141  135 - 145 (mEq/L)    Potassium 4.3  3.5 - 5.1 (mEq/L)   Chloride 105  96 - 112 (mEq/L)   CO2 24  19 - 32 (mEq/L)   Glucose, Bld 225 (*) 70 - 99 (mg/dL)   BUN 15  6 - 23 (mg/dL)   Creatinine, Ser 0.91  0.50 - 1.10 (mg/dL)   Calcium 9.7  8.4 - 10.5 (mg/dL)   GFR calc non Af Amer 62 (*) >90 (mL/min)   GFR calc Af Amer 72 (*) >90 (mL/min)  PRO B NATRIURETIC PEPTIDE     Status: Abnormal   Collection Time   12/01/11  1:10 PM      Component Value Range   Pro B Natriuretic peptide (BNP) 4068.0 (*) 0 - 125 (pg/mL)  SALICYLATE LEVEL     Status: Abnormal   Collection Time   12/01/11  1:11 PM      Component Value Range   Salicylate Lvl 123456 (*) 2.8 - 20.0 (mg/dL)  POCT I-STAT TROPONIN I     Status: Abnormal   Collection Time   12/01/11  3:18 PM      Component Value Range   Troponin i, poc 0.48 (*) 0.00 - 0.08 (ng/mL)   Comment NOTIFIED PHYSICIAN     Comment 3              Radiology/Studies: Dg Chest 2 View  12/01/2011  *RADIOLOGY REPORT*  Clinical Data: Right-sided chest and neck pain  CHEST - 2 VIEW  Comparison: Chest x-ray of 11/15/2005  Findings: No active infiltrate or effusion is seen.  Mediastinal contours are stable.  The heart is mildly enlarged and stable.  No bony abnormality is seen.  IMPRESSION: Stable chest x-ray.  No active lung disease.  Original Report Authenticated By: Joretta Bachelor, M.D.    EKG:NSR Sinus bradycardia without acute changes  ASSESSMENT AND PLAN:   Principal Problem:  *NSTEMI (non-ST elevated myocardial infarction)  Active Problems:  DIABETES MELLITUS-TYPE II  HTN (hypertension)  GERD  Family history of early CAD  Plan- Admit to stepdown, heparin, NTG paste, Crestor, and low dose beta blocker with parameters to hold less than 55. Cath in AM  Signed, Erlene Quan, PA-C 12/01/2011, 4:49 PM  I have seen and examined the patient along with Kerin Ransom, PA.  I have reviewed the chart, notes and new data.  I agree with Luke's note.  Key new complaints: No current chest discomfort  -- has noted decreased exercise tolerance over past few months, then 1st episode of CP to R jaw on Monday (2/4) then again today.  + Orthopnea. Key examination changes: Normal exam Key new findings / data: Troponin #1 0.48 with elevated Pro-BNP c/w NSTEMI; no acute ECG changes - although not recorded with CP Sx  PLAN: As noted above.   Given RFs & Fam Hx of CAD with typical Sx & + troponin (unsure if on home ASA) - TIMI risk Score is ~3-4 - based upon this, my recommendation is to proceed with Early Invasive Strategy for ACS with ASA/Heparin now (hold Thienopyridine antiplatelet until after diagnostic angiography for possibility of MVDz) - & plan LHC +/- PCI in AM. Low dose BB, Statin  CARDIAC CATHETERIZATION CONSENT:   Performing MD:  Shelva Majestic, M.D  Procedure:  Left Heart Catheterization with Coronary Angiography +/- Ad hoc PCI  The procedure with Risks/Benefits/Alternatives and Indications was reviewed with the patient and daughter.  All questions were answered.    Risks / Complications include, but not limited to: Death, MI, CVA/TIA, VF/VT (with defibrillation), Bradycardia (need for temporary pacer placement), contrast induced nephropathy, bleeding / bruising / hematoma / pseudoaneurysm, vascular or coronary injury (with possible emergent CT or Vascular Surgery), adverse medication reactions, infection.    The patient (and family) voice understanding and agree to proceed.     Leonie Man, M.D., M.S. [facility] [address] [place], [place]  [address]  [phone]  12/01/2011 5:00 PM

## 2011-12-01 NOTE — ED Notes (Signed)
Pt began having right chest heaviness Monday with nausea and diaphoresis and sob and dizziness.  It was intermittent yesterday and today it became severe after walking outside.  It was associated with inability to breathe.  SOB.  Pain is 5/10 at this time

## 2011-12-01 NOTE — ED Provider Notes (Signed)
History     CSN: [id number]  Arrival date & time 12/01/11  1137   First MD Initiated Contact with Patient 12/01/11 1240      Chief Complaint  Patient presents with  . Chest Pain    (Consider location/radiation/quality/duration/timing/severity/associated sxs/prior treatment) Patient is a 71 y.o. female presenting with chest pain. The history is provided by the patient and a relative.  Chest Pain The chest pain began 2 days ago. Duration of episode(s) is 10 minutes. Chest pain occurs intermittently. The chest pain is resolved. At its most intense, the pain is at 6/10. The severity of the pain is moderate. The quality of the pain is described as aching and dull. The pain radiates to the right jaw and right neck. Chest pain is worsened by exertion. Pertinent negatives for primary symptoms include no fever, no syncope, no shortness of breath, no cough, no wheezing, no palpitations, no abdominal pain, no nausea, no vomiting and no dizziness.  Pertinent negatives for associated symptoms include no weakness.    pain is predominantly right-sided with radiation into her right neck and right jaw clearly exertional gets worse with going up steps or walking outside resolves with rest. No past history of pain like this. Strong family history for events prior to the age of 32. Patient also a history of diabetes as a risk factor.  Past Medical History  Diagnosis Date  . Diabetes mellitus   . Hypertension   . GERD (gastroesophageal reflux disease)   . Thyroid disease   . Neuropathy     Past Surgical History  Procedure Date  . Knee surgery   . Back surgery   . Foot surgery     No family history on file.  History  Substance Use Topics  . Smoking status: Not on file  . Smokeless tobacco: Not on file  . Alcohol Use: No    OB History    Grav Para Term Preterm Abortions TAB SAB Ect Mult Living                  Review of Systems  Constitutional: Negative for fever and chills.  HENT:  Negative for congestion.   Eyes: Negative for visual disturbance.  Respiratory: Negative for cough, shortness of breath and wheezing.   Cardiovascular: Positive for chest pain. Negative for palpitations and syncope.  Gastrointestinal: Negative for nausea, vomiting and abdominal pain.  Genitourinary: Negative for dysuria.  Musculoskeletal: Negative for back pain.  Skin: Negative for rash.  Neurological: Negative for dizziness, weakness and headaches.  Hematological: Does not bruise/bleed easily.    Allergies  Epinephrine  Home Medications   Current Outpatient Rx  Name Route Sig Dispense Refill  . ACETAMINOPHEN 500 MG PO TABS Oral Take 1,000 mg by mouth every 6 (six) hours as needed. pain    . VITAMIN C PO Oral Take 1 tablet by mouth daily.    . ASPIRIN EC 81 MG PO TBEC Oral Take 81 mg by mouth once.    Marland Kitchen CALCIUM PO Oral Take 1 tablet by mouth daily.    Marland Kitchen VITAMIN D PO Oral Take 2 tablets by mouth daily.    Marland Kitchen VITAMIN B 12 PO Oral Take 1 tablet by mouth daily.    Marland Kitchen ESOMEPRAZOLE MAGNESIUM 40 MG PO CPDR Oral Take 40 mg by mouth daily before breakfast.    . GABAPENTIN 300 MG PO CAPS Oral Take 300 mg by mouth 3 (three) times daily.    Marland Kitchen GLIMEPIRIDE 4 MG PO TABS  Oral Take 4 mg by mouth 2 (two) times daily.    Marland Kitchen LEVOTHYROXINE SODIUM 50 MCG PO TABS Oral Take 50 mcg by mouth every other day.    Marland Kitchen MECLIZINE HCL 25 MG PO TABS Oral Take 25 mg by mouth 4 (four) times daily as needed.    . ADULT MULTIVITAMIN W/MINERALS CH Oral Take 1 tablet by mouth daily.    . QUINAPRIL HCL 20 MG PO TABS Oral Take 20 mg by mouth daily.    Marland Kitchen SITAGLIPTIN-METFORMIN HCL 50-1000 MG PO TABS Oral Take 1 tablet by mouth 2 (two) times daily with a meal.      BP 166/65  Pulse 78  Temp(Src) 98.1 F (36.7 C) (Oral)  Resp 18  SpO2 100%  Physical Exam  Constitutional: She is oriented to person, place, and time. She appears well-developed and well-nourished. No distress.  HENT:  Head: Normocephalic and atraumatic.    Mouth/Throat: Oropharynx is clear and moist.  Eyes: Conjunctivae and EOM are normal. Pupils are equal, round, and reactive to light.  Neck: Normal range of motion. Neck supple.  Cardiovascular: Normal rate, regular rhythm, normal heart sounds and intact distal pulses.   No murmur heard. Pulmonary/Chest: Effort normal and breath sounds normal. No respiratory distress. She has no wheezes. She has no rales. She exhibits no tenderness.  Abdominal: Soft. Bowel sounds are normal. There is no tenderness.  Musculoskeletal: Normal range of motion. She exhibits no edema and no tenderness.  Neurological: She is alert and oriented to person, place, and time. No cranial nerve deficit. She exhibits normal muscle tone. Coordination normal.  Skin: Skin is warm. No rash noted. She is not diaphoretic. No erythema.    ED Course  Procedures (including critical care time)  Labs Reviewed  CBC - Abnormal; Notable for the following:    WBC 11.7 (*)    RBC 3.78 (*)    HCT 34.7 (*)    All other components within normal limits  BASIC METABOLIC PANEL - Abnormal; Notable for the following:    Glucose, Bld 225 (*)    GFR calc non Af Amer 62 (*)    GFR calc Af Amer 72 (*)    All other components within normal limits  PRO B NATRIURETIC PEPTIDE - Abnormal; Notable for the following:    Pro B Natriuretic peptide (BNP) 4068.0 (*)    All other components within normal limits  SALICYLATE LEVEL - Abnormal; Notable for the following:    Salicylate Lvl 123456 (*)    All other components within normal limits  POCT I-STAT TROPONIN I - Abnormal; Notable for the following:    Troponin i, poc 0.48 (*)    All other components within normal limits    Date: 12/01/2011  Rate: 85  Rhythm: normal sinus rhythm  QRS Axis: normal  Intervals: normal  ST/T Wave abnormalities: nonspecific ST changes  Conduction Disutrbances:none  Narrative Interpretation:   Old EKG Reviewed: none available  Results for orders placed during the  hospital encounter of 12/01/11  CBC      Component Value Range   WBC 11.7 (*) 4.0 - 10.5 (K/uL)   RBC 3.78 (*) 3.87 - 5.11 (MIL/uL)   Hemoglobin 12.5  12.0 - 15.0 (g/dL)   HCT 34.7 (*) 36.0 - 46.0 (%)   MCV 91.8  78.0 - 100.0 (fL)   MCH 33.1  26.0 - 34.0 (pg)   MCHC 36.0  30.0 - 36.0 (g/dL)   RDW 12.8  11.5 - 15.5 (%)  Platelets 199  150 - 400 (K/uL)  BASIC METABOLIC PANEL      Component Value Range   Sodium 141  135 - 145 (mEq/L)   Potassium 4.3  3.5 - 5.1 (mEq/L)   Chloride 105  96 - 112 (mEq/L)   CO2 24  19 - 32 (mEq/L)   Glucose, Bld 225 (*) 70 - 99 (mg/dL)   BUN 15  6 - 23 (mg/dL)   Creatinine, Ser 0.91  0.50 - 1.10 (mg/dL)   Calcium 9.7  8.4 - 10.5 (mg/dL)   GFR calc non Af Amer 62 (*) >90 (mL/min)   GFR calc Af Amer 72 (*) >90 (mL/min)  PRO B NATRIURETIC PEPTIDE      Component Value Range   Pro B Natriuretic peptide (BNP) 4068.0 (*) 0 - 0000000 (pg/mL)  SALICYLATE LEVEL      Component Value Range   Salicylate Lvl 123456 (*) 2.8 - 20.0 (mg/dL)  POCT I-STAT TROPONIN I      Component Value Range   Troponin i, poc 0.48 (*) 0.00 - 0.08 (ng/mL)   Comment NOTIFIED PHYSICIAN     Comment 3              Dg Chest 2 View  12/01/2011  *RADIOLOGY REPORT*  Clinical Data: Right-sided chest and neck pain  CHEST - 2 VIEW  Comparison: Chest x-ray of 11/15/2005  Findings: No active infiltrate or effusion is seen.  Mediastinal contours are stable.  The heart is mildly enlarged and stable.  No bony abnormality is seen.  IMPRESSION: Stable chest x-ray.  No active lung disease.  Original Report Authenticated By: Joretta Bachelor, M.D.     1. Non-STEMI (non-ST elevated myocardial infarction)     CRITICAL CARE Performed by: Mervin Kung.   Total critical care time: 30  Critical care time was exclusive of separately billable procedures and treating other patients.  Critical care was necessary to treat or prevent imminent or life-threatening deterioration.  Critical care was time spent  personally by me on the following activities: development of treatment plan with patient and/or surrogate as well as nursing, discussions with consultants, evaluation of patient's response to treatment, examination of patient, obtaining history from patient or surrogate, ordering and performing treatments and interventions, ordering and review of laboratory studies, ordering and review of radiographic studies, pulse oximetry and re-evaluation of patient's condition.   MDM   Patient with chest pain intermittently since Monday increased pain on Tuesday but still had pain on and off worse pain today particularly with exertion workup in the emergency department confirmed elevated troponin consistent with a non-STEMI MI patient at least has unstable angina. Pain free currently. Did receive aspirin but has not received nitroglycerin. X-ray negative, EKG without acute changes. Cardiology consult and patient started on heparin IV bolus and then drip. Will require admission.        Mervin Kung, MD 12/01/11 [phone]

## 2011-12-01 NOTE — ED Notes (Signed)
Patient undressed and in a gown. Cardiac monitor, pulse ox, and blood pressure cuff on.

## 2011-12-02 ENCOUNTER — Encounter (HOSPITAL_COMMUNITY)
Admission: EM | Disposition: A | Discharge status: Home Health | Payer: Self-pay | Source: Home / Self Care | Attending: Cardiothoracic Surgery

## 2011-12-02 ENCOUNTER — Encounter (HOSPITAL_COMMUNITY): Payer: Medicare Other

## 2011-12-02 ENCOUNTER — Inpatient Hospital Stay (HOSPITAL_COMMUNITY): Payer: Medicare Other

## 2011-12-02 ENCOUNTER — Other Ambulatory Visit: Payer: Self-pay

## 2011-12-02 ENCOUNTER — Other Ambulatory Visit (HOSPITAL_COMMUNITY): Payer: Self-pay | Admitting: Radiology

## 2011-12-02 ENCOUNTER — Encounter (HOSPITAL_COMMUNITY): Payer: Self-pay | Admitting: General Practice

## 2011-12-02 DIAGNOSIS — I219 Acute myocardial infarction, unspecified: Secondary | ICD-10-CM

## 2011-12-02 DIAGNOSIS — I2510 Atherosclerotic heart disease of native coronary artery without angina pectoris: Secondary | ICD-10-CM

## 2011-12-02 HISTORY — PX: LEFT HEART CATHETERIZATION WITH CORONARY ANGIOGRAM: SHX5451

## 2011-12-02 HISTORY — PX: CARDIAC CATHETERIZATION: SHX172

## 2011-12-02 HISTORY — DX: Acute myocardial infarction, unspecified: I21.9

## 2011-12-02 LAB — LIPID PANEL
Cholesterol: 254 mg/dL — ABNORMAL HIGH (ref 0–200)
HDL: 36 mg/dL — ABNORMAL LOW (ref >39)
LDL Cholesterol: 143 mg/dL — ABNORMAL HIGH (ref 0–99)
Total CHOL/HDL Ratio: 7.1 RATIO
Triglycerides: 375 mg/dL — ABNORMAL HIGH (ref <150)
VLDL: 75 mg/dL — ABNORMAL HIGH (ref 0–40)

## 2011-12-02 LAB — CARDIAC PANEL(CRET KIN+CKTOT+MB+TROPI)
CK, MB: 5.1 ng/mL — ABNORMAL HIGH (ref 0.3–4.0)
CK, MB: 4.7 ng/mL — ABNORMAL HIGH (ref 0.3–4.0)
Relative Index: 4.6 — ABNORMAL HIGH (ref 0.0–2.5)
Relative Index: 4.6 — ABNORMAL HIGH (ref 0.0–2.5)
Total CK: 112 U/L (ref 7–177)
Total CK: 103 U/L (ref 7–177)
Troponin I: 1.01 ng/mL (ref <0.30)
Troponin I: 0.96 ng/mL (ref <0.30)

## 2011-12-02 LAB — CBC
HCT: 34.7 % — ABNORMAL LOW (ref 36.0–46.0)
Hemoglobin: 12.1 g/dL (ref 12.0–15.0)
MCH: 32.4 pg (ref 26.0–34.0)
MCHC: 34.9 g/dL (ref 30.0–36.0)
MCV: 93 fL (ref 78.0–100.0)
Platelets: 198 10*3/uL (ref 150–400)
RBC: 3.73 MIL/uL — ABNORMAL LOW (ref 3.87–5.11)
RDW: 12.9 % (ref 11.5–15.5)
WBC: 8.7 10*3/uL (ref 4.0–10.5)

## 2011-12-02 LAB — BASIC METABOLIC PANEL
BUN: 18 mg/dL (ref 6–23)
CO2: 26 mEq/L (ref 19–32)
Calcium: 9.6 mg/dL (ref 8.4–10.5)
Chloride: 105 mEq/L (ref 96–112)
Creatinine, Ser: 0.98 mg/dL (ref 0.50–1.10)
GFR calc Af Amer: 66 mL/min — ABNORMAL LOW (ref >90)
GFR calc non Af Amer: 57 mL/min — ABNORMAL LOW (ref >90)
Glucose, Bld: 183 mg/dL — ABNORMAL HIGH (ref 70–99)
Potassium: 4.4 mEq/L (ref 3.5–5.1)
Sodium: 140 mEq/L (ref 135–145)

## 2011-12-02 LAB — HEPARIN LEVEL (UNFRACTIONATED)
Heparin Unfractionated: 0.16 IU/mL — ABNORMAL LOW (ref 0.30–0.70)
Heparin Unfractionated: 0.28 IU/mL — ABNORMAL LOW (ref 0.30–0.70)

## 2011-12-02 LAB — GLUCOSE, CAPILLARY
Glucose-Capillary: 153 mg/dL — ABNORMAL HIGH (ref 70–99)
Glucose-Capillary: 159 mg/dL — ABNORMAL HIGH (ref 70–99)
Glucose-Capillary: 112 mg/dL — ABNORMAL HIGH (ref 70–99)
Glucose-Capillary: 214 mg/dL — ABNORMAL HIGH (ref 70–99)
Glucose-Capillary: 234 mg/dL — ABNORMAL HIGH (ref 70–99)

## 2011-12-02 LAB — HEMOGLOBIN A1C
Hgb A1c MFr Bld: 7.4 % — ABNORMAL HIGH (ref <5.7)
Mean Plasma Glucose: 166 mg/dL — ABNORMAL HIGH (ref <117)

## 2011-12-02 LAB — TSH: TSH: 1.983 u[IU]/mL (ref 0.350–4.500)

## 2011-12-02 SURGERY — LEFT HEART CATHETERIZATION WITH CORONARY ANGIOGRAM
Anesthesia: LOCAL

## 2011-12-02 MED ORDER — NITROGLYCERIN IN D5W 200-5 MCG/ML-% IV SOLN
2.0000 ug/min | INTRAVENOUS | Status: DC
  Administered 2011-12-02: 10 ug/min via INTRAVENOUS
  Administered 2011-12-02: 5 ug/min via INTRAVENOUS
  Administered 2011-12-02: 20 ug/min via INTRAVENOUS
  Filled 2011-12-02: qty 250

## 2011-12-02 MED ORDER — ~~LOC~~ CARDIAC SURGERY, PATIENT & FAMILY EDUCATION
Freq: Once | Status: AC
  Administered 2011-12-03: 12:00:00
  Filled 2011-12-02: qty 1

## 2011-12-02 MED ORDER — MAGNESIUM HYDROXIDE 400 MG/5ML PO SUSP
30.0000 mL | Freq: Every day | ORAL | Status: DC | PRN
  Administered 2011-12-02: 30 mL via ORAL
  Filled 2011-12-02: qty 30

## 2011-12-02 MED ORDER — WHITE PETROLATUM GEL
Status: DC | PRN
  Filled 2011-12-02: qty 5

## 2011-12-02 MED ORDER — HEPARIN (PORCINE) IN NACL 2-0.9 UNIT/ML-% IJ SOLN
INTRAMUSCULAR | Status: AC
  Filled 2011-12-02: qty 1000

## 2011-12-02 MED ORDER — ACETAMINOPHEN 325 MG PO TABS
650.0000 mg | ORAL_TABLET | ORAL | Status: DC | PRN
Start: 2011-12-02 — End: 2011-12-04

## 2011-12-02 MED ORDER — NITROGLYCERIN 0.2 MG/ML ON CALL CATH LAB
INTRAVENOUS | Status: AC
  Filled 2011-12-02: qty 1

## 2011-12-02 MED ORDER — HEPARIN SOD (PORCINE) IN D5W 100 UNIT/ML IV SOLN
1050.0000 [IU]/h | INTRAVENOUS | Status: DC
  Administered 2011-12-02 (×2): 1100 [IU]/h via INTRAVENOUS
  Administered 2011-12-03: 1050 [IU]/h via INTRAVENOUS
  Administered 2011-12-03: 1200 [IU]/h via INTRAVENOUS
  Filled 2011-12-02 (×4): qty 250

## 2011-12-02 MED ORDER — LIDOCAINE HCL (PF) 1 % IJ SOLN
INTRAMUSCULAR | Status: AC
  Filled 2011-12-02: qty 30

## 2011-12-02 MED ORDER — ALBUTEROL SULFATE (5 MG/ML) 0.5% IN NEBU
2.5000 mg | INHALATION_SOLUTION | Freq: Once | RESPIRATORY_TRACT | Status: AC
  Administered 2011-12-02: 2.5 mg via RESPIRATORY_TRACT

## 2011-12-02 MED ORDER — METOPROLOL TARTRATE 25 MG PO TABS
25.0000 mg | ORAL_TABLET | Freq: Two times a day (BID) | ORAL | Status: DC
  Administered 2011-12-02 – 2011-12-03 (×3): 25 mg via ORAL
  Filled 2011-12-02 (×6): qty 1

## 2011-12-02 MED ORDER — MIDAZOLAM HCL 2 MG/2ML IJ SOLN
INTRAMUSCULAR | Status: AC
  Filled 2011-12-02: qty 2

## 2011-12-02 MED ORDER — MORPHINE SULFATE 2 MG/ML IJ SOLN
2.0000 mg | INTRAMUSCULAR | Status: DC | PRN
  Administered 2011-12-03: 2 mg via INTRAVENOUS
  Filled 2011-12-02: qty 1

## 2011-12-02 MED ORDER — FENTANYL CITRATE 0.05 MG/ML IJ SOLN
INTRAMUSCULAR | Status: AC
  Filled 2011-12-02: qty 2

## 2011-12-02 MED ORDER — SODIUM CHLORIDE 0.9 % IV SOLN
INTRAVENOUS | Status: DC
  Administered 2011-12-02 (×2): via INTRAVENOUS

## 2011-12-02 MED ORDER — HEPARIN BOLUS VIA INFUSION
1500.0000 [IU] | Freq: Once | INTRAVENOUS | Status: AC
  Administered 2011-12-02: 1500 [IU] via INTRAVENOUS
  Filled 2011-12-02: qty 1500

## 2011-12-02 MED ORDER — VITAMIN C 500 MG PO TABS
500.0000 mg | ORAL_TABLET | Freq: Every day | ORAL | Status: DC
  Administered 2011-12-03: 500 mg via ORAL
  Filled 2011-12-02 (×3): qty 1

## 2011-12-02 MED ORDER — WHITE PETROLATUM GEL
Status: DC | PRN
  Filled 2011-12-02: qty 28.35

## 2011-12-02 MED ORDER — ONDANSETRON HCL 4 MG/2ML IJ SOLN
4.0000 mg | Freq: Four times a day (QID) | INTRAMUSCULAR | Status: DC | PRN
  Administered 2011-12-04 – 2011-12-06 (×3): 4 mg via INTRAVENOUS
  Filled 2011-12-02 (×3): qty 2

## 2011-12-02 MED ORDER — NITROGLYCERIN IN D5W 200-5 MCG/ML-% IV SOLN
INTRAVENOUS | Status: AC
  Filled 2011-12-02: qty 250

## 2011-12-02 NOTE — Progress Notes (Signed)
ANTICOAGULATION CONSULT NOTE - Initial Consult  Pharmacy Consult for Heparin Indication: chest pain/ACS  Assessment: 71 yo female on IV heparin for chest pain at 750 units/hr. Heparin level (0.16) is below-goal. No problem with line per RN.   Goal of Therapy:  Heparin level 0.3-0.7 units/ml   Plan:  1. Heparin IV bolus of 1500 units x 1, then increase IV infusion to 1000 units/hr.  2. Heparin level in 8 hours vs. Follow-up post-cath.    Allergies  Allergen Reactions  . Epinephrine Other (See Comments)    Abnormal feeling    Estimated Patient Measurements: Height: 61" Weight: 61.4 kg Heparin Dosing Weight: 60.2  Vital Signs: Temp: 99 F (37.2 C) (02/07 0140) Temp src: Oral (02/07 0140) BP: 117/44 mmHg (02/07 0140) Pulse Rate: 59  (02/07 0140)  Labs:  Basename 12/02/11 0031 12/01/11 1857 12/01/11 1856 12/01/11 1306  HGB -- -- 12.2 12.5  HCT -- -- 34.2* 34.7*  PLT -- -- 199 199  APTT -- -- 71* --  LABPROT -- -- 13.3 --  INR -- -- 0.99 --  HEPARINUNFRC 0.16* -- -- --  CREATININE -- -- 0.92 0.91  CKTOTAL -- 129 -- --  CKMB -- 5.6* -- --  TROPONINI -- 1.22* -- --   Estimated Creatinine Clearance: 46 ml/min (by C-G formula based on Cr of 0.92).  Medical History: Past Medical History  Diagnosis Date  . Diabetes mellitus   . Hypertension   . GERD (gastroesophageal reflux disease)   . Thyroid disease   . Neuropathy     Medications:  Scheduled:     . ascorbic acid  500 mg Oral Daily  . aspirin  243 mg Oral Once  . aspirin EC  81 mg Oral Once  . aspirin EC  81 mg Oral Daily  . diazepam  5 mg Oral On Call  . gabapentin  300 mg Oral TID  . glimepiride  4 mg Oral BID  . heparin  750 Units/hr Intravenous To Minor  . heparin  3,600 Units Intravenous Once  . insulin aspart  0-5 Units Subcutaneous QHS  . insulin aspart  0-9 Units Subcutaneous TID WC  . levothyroxine  50 mcg Oral Custom  . lisinopril  20 mg Oral Daily  . metoprolol tartrate  12.5 mg Oral BID   . mulitivitamin with minerals  1 tablet Oral Daily  . nitroGLYCERIN  0.2 mg Transdermal Daily  . pantoprazole  40 mg Oral Daily  . rosuvastatin  20 mg Oral Daily  . sodium chloride  3 mL Intravenous Q12H  . sodium chloride  3 mL Intravenous Q12H  . DISCONTD: aspirin  325 mg Oral STAT  . DISCONTD: heparin  14 Units/kg/hr Intravenous Once  . DISCONTD: heparin  4,000 Units Intravenous Once  . DISCONTD: nitroGLYCERIN  0.5 inch Topical Q6H  . DISCONTD: quinapril  20 mg Oral Daily   Infusions:     . sodium chloride    . heparin      Otila Back 12/02/2011,2:28 AM

## 2011-12-02 NOTE — Progress Notes (Signed)
ANTICOAGULATION CONSULT NOTE - Follow Up Consult  Pharmacy Consult for Heparin Indication: NSTEMI  Allergies  Allergen Reactions  . Epinephrine Other (See Comments)    Abnormal feeling    Patient Measurements: Height: 5' (152.4 cm) Weight: 135 lb 12.9 oz (61.6 kg) IBW/kg (Calculated) : 45.5  Heparin Dosing Weight: 60.2 Kg  Vital Signs: Temp: 98.1 F (36.7 C) (02/07 2338) Temp src: Oral (02/07 2338) BP: 129/46 mmHg (02/07 2338) Pulse Rate: 69  (02/07 2152)  Labs:  Flo Shanks 12/02/11 2311 12/02/11 0655 12/02/11 0034 12/02/11 0031 12/01/11 1857 12/01/11 1856 12/01/11 1306  HGB -- 12.1 -- -- -- 12.2 --  HCT -- 34.7* -- -- -- 34.2* 34.7*  PLT -- 198 -- -- -- 199 199  APTT -- -- -- -- -- 71* --  LABPROT -- -- -- -- -- 13.3 --  INR -- -- -- -- -- 0.99 --  HEPARINUNFRC 0.28* -- -- 0.16* -- -- --  CREATININE -- 0.98 -- -- -- 0.92 0.91  CKTOTAL -- 103 112 -- 129 -- --  CKMB -- 4.7* 5.1* -- 5.6* -- --  TROPONINI -- 0.96* 1.01* -- 1.22* -- --   Estimated Creatinine Clearance: 43.1 ml/min (by C-G formula based on Cr of 0.98).  Assessment: 71 yo female with ACS, CAD s/p cath, now awaiting CABG, for Heparin  Goal of Therapy:  Heparin level 0.3-0.7 units/ml   Plan:  Increase Heparin 1200 units/hr Follow-up am labs.  Phillis Knack, PharmD, BCPS 12/02/2011 11:58 PM

## 2011-12-02 NOTE — Progress Notes (Signed)
UR Completed. Simmons, Rebel Willcutt F [phone]  

## 2011-12-02 NOTE — Progress Notes (Signed)
Consulted with Dr. Debara Pickett regarding ordering MOM for pt with L main disease that is due to have OHS on 2/9.  MD said it was ok to give pt MOM for constipation.

## 2011-12-02 NOTE — Progress Notes (Signed)
ANTICOAGULATION CONSULT NOTE - Follow Up Consult  Pharmacy Consult for Heparin Indication: NSTEMI  Allergies  Allergen Reactions  . Epinephrine Other (See Comments)    Abnormal feeling    Patient Measurements: Height: 5' (152.4 cm) Weight: 135 lb 12.9 oz (61.6 kg) IBW/kg (Calculated) : 45.5  Heparin Dosing Weight: 60.2 Kg  Vital Signs: Temp: 97.9 F (36.6 C) (02/07 0742) Temp src: Oral (02/07 0742) BP: 132/50 mmHg (02/07 0742) Pulse Rate: 56  (02/07 0742)  Labs:  Flo Shanks 12/02/11 0655 12/02/11 0034 12/02/11 0031 12/01/11 1857 12/01/11 1856 12/01/11 1306  HGB 12.1 -- -- -- 12.2 --  HCT 34.7* -- -- -- 34.2* 34.7*  PLT 198 -- -- -- 199 199  APTT -- -- -- -- 71* --  LABPROT -- -- -- -- 13.3 --  INR -- -- -- -- 0.99 --  HEPARINUNFRC -- -- 0.16* -- -- --  CREATININE 0.98 -- -- -- 0.92 0.91  CKTOTAL 103 112 -- 129 -- --  CKMB 4.7* 5.1* -- 5.6* -- --  TROPONINI 0.96* 1.01* -- 1.22* -- --   Estimated Creatinine Clearance: 43.1 ml/min (by C-G formula based on Cr of 0.98).  Assessment: Patient known to pharmacy from initiation of heparin pre-cath for CP/ACS. Noted cath findings, plans for CABG assessment. Baseline labs noted, received orders to start IV heparin at 1425 (4 hours post sheath removal).  Goal of Therapy:  Heparin level 0.3-0.7 units/ml   Plan:  1. At 1425, restart IV heparin infusion at 1100 units/hr. 2. Check heparin level at 2300 tonight. 3. Check daily heparin level and CBC.  Meera K. Posey Pronto, PharmD, BCPS.  Clinical Pharmacist Pager [phone]. 12/02/2011 12:04 PM

## 2011-12-02 NOTE — Progress Notes (Signed)
Pt complained of 2/10 chest pain that was at her sternum and radiated up her neck at change of shift (~1915).  Turned up NTG to 20 mcg.  A little later, pt complained of 2/10 chest pain after getting up and going to the bathroom.  I then turned her up to 25 mcg.  Pt states that when she is resting, her chest pain goes away.  I instructed her to get back into bed, take some deep breaths, and relax.  I then took her BP to make sure she was not hypotensive with turning up the NTG.  Her BP was 174/72, so I then turned up the NTG to 30 mcg to help her BP go down.  Pt is stable and currently relaxing in bed.  Will continue to assess.

## 2011-12-02 NOTE — Plan of Care (Signed)
Problem: Consults Goal: Cardiac Cath Patient Education (See Patient Education module for education specifics.)  Outcome: Completed/Met Date Met:  12/02/11 Pt watched education video on cardiac cath.

## 2011-12-02 NOTE — Brief Op Note (Signed)
12/01/2011 - 12/02/2011  10:06 AM  PATIENT:  [name]  71 y.o. female  PRE-OPERATIVE DIAGNOSIS:  NSTEMI  Full note dictated; see diagram.   DICTATION # [id number], [id number]  LM: 20% ostial LAD: 50% ostial extending to  99% followed by 70% proximal stenosis, 20 % mid LCX: 80% ostial RCA 40 - 50% mid  LV with mod anterolateral hypokinesis mid to distally extending around apex; EF 50%  REC:  Evaluate for CABG.  Troy Sine, MD, [facility] 12/02/2011 10:06 AM

## 2011-12-02 NOTE — Progress Notes (Signed)
CRITICAL VALUE ALERT  Critical value received:troponin 1.22    Date of notification:  12/01/11  Time of notification:  2005  Critical value read back:yes  Nurse who received alert:  Teresita Madura RN  MD notified (1st page):  Dr Sallyanne Kuster  Time of first page:  2005  MD notified (2nd page):  Time of second page:  Responding MD:  Dr Sallyanne Kuster  Time MD responded:  2007

## 2011-12-02 NOTE — Progress Notes (Signed)
[facility] Progress Note  Subjective:  No recurrent chest pain.  Objective:   Vital Signs in the last 24 hours: Temp:  [97.4 F (36.3 C)-99 F (37.2 C)] 97.9 F (36.6 C) (02/07 0742) Pulse Rate:  [56-82] 56  (02/07 0742) Resp:  [15-21] 16  (02/07 0742) BP: (99-194)/(39-81) 132/50 mmHg (02/07 0742) SpO2:  [98 %-100 %] 99 % (02/07 0742) Weight:  [61.6 kg (135 lb 12.9 oz)] 61.6 kg (135 lb 12.9 oz) (02/07 0140)  Intake/Output from previous day: 02/06 0701 - 02/07 0700 In: 617.5 [I.V.:617.5] Out: 350 [Urine:350]  Scheduled:   . ascorbic acid  500 mg Oral Daily  . aspirin  243 mg Oral Once  . aspirin EC  81 mg Oral Once  . aspirin EC  81 mg Oral Daily  . diazepam  5 mg Oral On Call  . gabapentin  300 mg Oral TID  . glimepiride  4 mg Oral BID  . heparin  750 Units/hr Intravenous To Minor  . heparin  1,500 Units Intravenous Once  . heparin  3,600 Units Intravenous Once  . insulin aspart  0-5 Units Subcutaneous QHS  . insulin aspart  0-9 Units Subcutaneous TID WC  . levothyroxine  50 mcg Oral Custom  . lisinopril  20 mg Oral Daily  . metoprolol tartrate  12.5 mg Oral BID  . mulitivitamin with minerals  1 tablet Oral Daily  . nitroGLYCERIN  0.2 mg Transdermal Daily  . pantoprazole  40 mg Oral Daily  . rosuvastatin  20 mg Oral Daily  . sodium chloride  3 mL Intravenous Q12H  . sodium chloride  3 mL Intravenous Q12H  . DISCONTD: aspirin  325 mg Oral STAT  . DISCONTD: heparin  14 Units/kg/hr Intravenous Once  . DISCONTD: heparin  4,000 Units Intravenous Once  . DISCONTD: nitroGLYCERIN  0.5 inch Topical Q6H  . DISCONTD: quinapril  20 mg Oral Daily    Physical Exam:   General appearance: alert, cooperative, appears stated age and no distress Neck: no adenopathy, no carotid bruit, no JVD, supple, symmetrical, trachea midline and thyroid not enlarged, symmetric, no tenderness/mass/nodules Lungs: clear to auscultation bilaterally Heart: regular  rate and rhythm, S1, S2 normal, no murmur, click, rub or gallop Abdomen: soft, non-tender; bowel sounds normal; no masses,  no organomegaly Extremities: extremities normal, atraumatic, no cyanosis or edema Pulses: 2+ and symmetric   Rate: 62  Rhythm: normal sinus rhythm  Lab Results:    Basename 12/02/11 0655 12/01/11 1856  NA 140 141  K 4.4 4.2  CL 105 106  CO2 26 23  GLUCOSE 183* 154*  BUN 18 14  CREATININE 0.98 0.92    Basename 12/02/11 0655 12/02/11 0034  TROPONINI 0.96* 1.01*   Hepatic Function Panel  Basename 12/01/11 1856  PROT 7.0  ALBUMIN 3.7  AST 25  ALT 15  ALKPHOS 68  BILITOT 0.3  BILIDIR --  IBILI --    Basename 12/01/11 1856  INR 0.99     Lipid Panel     Component Value Date/Time   CHOL 254* 12/02/2011 0655   TRIG 375* 12/02/2011 0655   HDL 36* 12/02/2011 0655   CHOLHDL 7.1 12/02/2011 0655   VLDL 75* 12/02/2011 0655   LDLCALC 143* 12/02/2011 0655     Imaging:  Dg Chest 2 View  12/01/2011  *RADIOLOGY REPORT*  Clinical Data: Right-sided chest and neck pain  CHEST - 2 VIEW  Comparison: Chest x-ray of 11/15/2005  Findings: No active  infiltrate or effusion is seen.  Mediastinal contours are stable.  The heart is mildly enlarged and stable.  No bony abnormality is seen.  IMPRESSION: Stable chest x-ray.  No active lung disease.  Original Report Authenticated By: Joretta Bachelor, M.D.      Assessment/Plan:   Principal Problem:  *NSTEMI (non-ST elevated myocardial infarction) Active Problems:  GERD  DIABETES MELLITUS-TYPE II  HTN (hypertension)  Family history of early CAD   Discussed cath with pt and daughter.  No further cp on meds.  Cardiac enzymes positive for NSTEMI.  For cath and possible PCI this am. Consider niaspam added to statin with increased TG and low HDL for her atherogenic dyslipidemic lipid profile.   Troy Sine, MD, [facility] 12/02/2011, 8:32 AM

## 2011-12-02 NOTE — Consult Note (Signed)
Reason for consultation--unstable angina non-stem he MI multivessel CAD Physician requesting consultation-, Georgina Peer M.D.  Consult note The patient is a 71 year old diabetic nonsmoker with previous episodes of chest pain now with severe chest pain unstable angina and positive cardiac enzymes consistent with non-stemi MI. The patient underwent cardiac catheterization which demonstrates a 99% stenosis of the LAD and apical hypokinesia. There is moderate disease of the circumflex and RCA as well. Patient is currently stable on heparin and nitroglycerin and beta blockade. Surgical coronary restoration is recommended due to the patient's coronary anatomy. Her chest x-ray is clear. She is in sinus rhythm. Stable hemodynamics.  Past medical history 1 diabetes mellitus 2 hypertension 3 GERD   Review of systems Constitutional review negative for fever weight loss HEENT review negative for difficulty swallowing or aspiration change in vision Pulmonary negative for recent upper respiratory infection Cardiac positive for CAD negative for valve disease arrhythmia or murmur Endocrine positive diabetes and hypothyroidism hemoglobin A1c pending Past review negative for DVT TIA claudication  Physical exam blood pressure 150/85 pulse 80 sinus  afebrile saturation 95% on room air General appearance elderly female no distress HEENT normocephalic Neck without JVD carotid bruit mass or adenopathy Chest clear breath sounds no deformity or tenderness Cardiac regular rhythm without murmur or gallop  NeurologicNo focal motor deficit Vascular palpable peripheral pulses no evidence of peripheral venous insufficiency  Plan the patient will be prepared for multivessel bypass grafting within the next 24-48 hours. I discussed the procedure with her and she understands and agrees to proceed. Carotid Dopplers PFTs in 2-D echo are pending.

## 2011-12-02 NOTE — Plan of Care (Signed)
Problem: Phase I Progression Outcomes Goal: Post Cath/PCI return to appropriate Path Outcome: Completed/Met Date Met:  12/02/11 Opened the Cardiac Surgery Care plan

## 2011-12-03 ENCOUNTER — Other Ambulatory Visit: Payer: Self-pay

## 2011-12-03 ENCOUNTER — Inpatient Hospital Stay (HOSPITAL_COMMUNITY): Payer: Medicare Other

## 2011-12-03 ENCOUNTER — Encounter (HOSPITAL_COMMUNITY): Payer: Medicare Other

## 2011-12-03 DIAGNOSIS — E1169 Type 2 diabetes mellitus with other specified complication: Secondary | ICD-10-CM | POA: Diagnosis present

## 2011-12-03 DIAGNOSIS — I2510 Atherosclerotic heart disease of native coronary artery without angina pectoris: Secondary | ICD-10-CM

## 2011-12-03 DIAGNOSIS — E785 Hyperlipidemia, unspecified: Secondary | ICD-10-CM | POA: Diagnosis present

## 2011-12-03 DIAGNOSIS — Z01810 Encounter for preprocedural cardiovascular examination: Secondary | ICD-10-CM

## 2011-12-03 LAB — CBC
HCT: 28.9 % — ABNORMAL LOW (ref 36.0–46.0)
Hemoglobin: 10 g/dL — ABNORMAL LOW (ref 12.0–15.0)
MCH: 32.4 pg (ref 26.0–34.0)
MCHC: 34.6 g/dL (ref 30.0–36.0)
MCV: 93.5 fL (ref 78.0–100.0)
Platelets: 162 10*3/uL (ref 150–400)
RBC: 3.09 MIL/uL — ABNORMAL LOW (ref 3.87–5.11)
RDW: 13.3 % (ref 11.5–15.5)
WBC: 8.2 10*3/uL (ref 4.0–10.5)

## 2011-12-03 LAB — HEMOGLOBIN A1C
Hgb A1c MFr Bld: 7.3 % — ABNORMAL HIGH (ref <5.7)
Mean Plasma Glucose: 163 mg/dL — ABNORMAL HIGH (ref <117)

## 2011-12-03 LAB — URINALYSIS, ROUTINE W REFLEX MICROSCOPIC
Bilirubin Urine: NEGATIVE
Glucose, UA: NEGATIVE mg/dL
Hgb urine dipstick: NEGATIVE
Ketones, ur: NEGATIVE mg/dL
Nitrite: NEGATIVE
Protein, ur: NEGATIVE mg/dL
Specific Gravity, Urine: 1.007 (ref 1.005–1.030)
Urobilinogen, UA: 0.2 mg/dL (ref 0.0–1.0)
pH: 7 (ref 5.0–8.0)

## 2011-12-03 LAB — PREPARE RBC (CROSSMATCH)

## 2011-12-03 LAB — GLUCOSE, CAPILLARY
Glucose-Capillary: 187 mg/dL — ABNORMAL HIGH (ref 70–99)
Glucose-Capillary: 206 mg/dL — ABNORMAL HIGH (ref 70–99)
Glucose-Capillary: 182 mg/dL — ABNORMAL HIGH (ref 70–99)
Glucose-Capillary: 184 mg/dL — ABNORMAL HIGH (ref 70–99)

## 2011-12-03 LAB — URINE MICROSCOPIC-ADD ON

## 2011-12-03 LAB — HEPARIN LEVEL (UNFRACTIONATED)
Heparin Unfractionated: 0.57 IU/mL (ref 0.30–0.70)
Heparin Unfractionated: 0.83 IU/mL — ABNORMAL HIGH (ref 0.30–0.70)
Heparin Unfractionated: 0.57 IU/mL (ref 0.30–0.70)

## 2011-12-03 LAB — ABO/RH: ABO/RH(D): A NEG

## 2011-12-03 LAB — POCT ACTIVATED CLOTTING TIME: Activated Clotting Time: 100 seconds

## 2011-12-03 MED ORDER — DEXTROSE 5 % IV SOLN
1.5000 g | INTRAVENOUS | Status: AC
  Administered 2011-12-04: 1.5 g via INTRAVENOUS
  Administered 2011-12-04: .75 g via INTRAVENOUS
  Filled 2011-12-03: qty 1.5

## 2011-12-03 MED ORDER — SODIUM CHLORIDE 0.9 % IV SOLN
INTRAVENOUS | Status: DC
  Filled 2011-12-03: qty 40

## 2011-12-03 MED ORDER — SODIUM CHLORIDE 0.9 % IV SOLN
1250.0000 mg | INTRAVENOUS | Status: AC
  Administered 2011-12-04: 1250 mg via INTRAVENOUS
  Filled 2011-12-03: qty 1250

## 2011-12-03 MED ORDER — DEXTROSE 5 % IV SOLN
0.5000 ug/min | INTRAVENOUS | Status: DC
  Filled 2011-12-03: qty 4

## 2011-12-03 MED ORDER — SODIUM CHLORIDE 0.9 % IV SOLN
0.1000 ug/kg/h | INTRAVENOUS | Status: AC
  Administered 2011-12-04: .2 ug/kg/h via INTRAVENOUS
  Filled 2011-12-03: qty 4

## 2011-12-03 MED ORDER — CHLORHEXIDINE GLUCONATE 4 % EX LIQD
60.0000 mL | Freq: Once | CUTANEOUS | Status: AC
  Administered 2011-12-03: 4 via TOPICAL
  Filled 2011-12-03: qty 60

## 2011-12-03 MED ORDER — HEART ATTACK BOUNCING BOOK
Freq: Once | Status: DC
Start: 2011-12-03 — End: 2011-12-04
  Filled 2011-12-03: qty 1

## 2011-12-03 MED ORDER — MAGNESIUM SULFATE 50 % IJ SOLN
40.0000 meq | INTRAMUSCULAR | Status: DC
  Filled 2011-12-03: qty 10

## 2011-12-03 MED ORDER — CHLORHEXIDINE GLUCONATE 4 % EX LIQD
60.0000 mL | Freq: Once | CUTANEOUS | Status: DC
  Filled 2011-12-03: qty 15
  Filled 2011-12-03: qty 60
  Filled 2011-12-03: qty 15

## 2011-12-03 MED ORDER — DEXTROSE 5 % IV SOLN
750.0000 mg | INTRAVENOUS | Status: DC
  Filled 2011-12-03: qty 750

## 2011-12-03 MED ORDER — CHLORHEXIDINE GLUCONATE 4 % EX LIQD: 60.0000 mL | Freq: Once | CUTANEOUS | Status: DC

## 2011-12-03 MED ORDER — POTASSIUM CHLORIDE 2 MEQ/ML IV SOLN
80.0000 meq | INTRAVENOUS | Status: DC
  Filled 2011-12-03: qty 40

## 2011-12-03 MED ORDER — PHENYLEPHRINE HCL 10 MG/ML IJ SOLN
30.0000 ug/min | INTRAMUSCULAR | Status: DC
  Filled 2011-12-03: qty 2

## 2011-12-03 MED ORDER — BISACODYL 5 MG PO TBEC: 5.0000 mg | DELAYED_RELEASE_TABLET | Freq: Once | ORAL | Status: DC

## 2011-12-03 MED ORDER — SODIUM CHLORIDE 0.9 % IV SOLN
INTRAVENOUS | Status: DC
  Filled 2011-12-03: qty 1

## 2011-12-03 MED ORDER — METOPROLOL TARTRATE 12.5 MG HALF TABLET
12.5000 mg | ORAL_TABLET | Freq: Once | ORAL | Status: AC
  Administered 2011-12-04: 12.5 mg via ORAL
  Filled 2011-12-03: qty 1

## 2011-12-03 MED ORDER — TEMAZEPAM 15 MG PO CAPS: 15.0000 mg | ORAL_CAPSULE | Freq: Once | ORAL | Status: AC | PRN

## 2011-12-03 MED ORDER — NITROGLYCERIN 5 MG/ML IV SOLN
INTRAVENOUS | Status: AC
  Administered 2011-12-04: 08:00:00
  Filled 2011-12-03: qty 2.5

## 2011-12-03 MED ORDER — NITROGLYCERIN IN D5W 200-5 MCG/ML-% IV SOLN
2.0000 ug/min | INTRAVENOUS | Status: DC
  Filled 2011-12-03: qty 250

## 2011-12-03 MED ORDER — INSULIN GLARGINE 100 UNIT/ML ~~LOC~~ SOLN
12.0000 [IU] | Freq: Two times a day (BID) | SUBCUTANEOUS | Status: DC
  Administered 2011-12-03 (×2): 12 [IU] via SUBCUTANEOUS
  Filled 2011-12-03: qty 3

## 2011-12-03 MED ORDER — DOPAMINE-DEXTROSE 3.2-5 MG/ML-% IV SOLN
2.0000 ug/kg/min | INTRAVENOUS | Status: DC
  Filled 2011-12-03: qty 250

## 2011-12-03 NOTE — Progress Notes (Signed)
ANTICOAGULATION CONSULT NOTE - Follow Up Consult  Pharmacy Consult for Heparin Indication: NSTEMI, s/p cath & awaiting CABG  HL = 0.57 (goal 0.3 - 0.7 units/mL) Heparin weight = 60kg  Assessment: 35 YOF with NSTEMI, now s/p cath and awaiting CABG.  Patient on heparin gtt with therapeutic heparin level.  No bleeding documented.  Plan: - Continue heparin gtt at 1050 units/hr - F/U AM labs, CABG in AM   Johnnette Gourd, PharmD 12/03/2011

## 2011-12-03 NOTE — Plan of Care (Signed)
Problem: Phase II Progression Outcomes Goal: Wean IV nitroglycerin to PO or topical Outcome: Not Progressing Pt had to go back up on her NTG drip d/t chest pain during the day.

## 2011-12-03 NOTE — Progress Notes (Addendum)
ANTICOAGULATION CONSULT NOTE - Follow Up Consult  Pharmacy Consult for Heparin Indication: NSTEMI, s/p cath & awaiting CABG  Allergies  Allergen Reactions  . Epinephrine Other (See Comments)    Abnormal feeling   Vital Signs: Temp: 98.3 F (36.8 C) (02/08 1316) Temp src: Oral (02/08 1316) BP: 119/43 mmHg (02/08 0904) Pulse Rate: 55  (02/08 0904)  Labs:  Basename 12/03/11 1510 12/03/11 0447 12/02/11 2311 12/02/11 0655 12/02/11 0034 12/01/11 1857 12/01/11 1856 12/01/11 1306  HGB -- 10.0* -- 12.1 -- -- -- --  HCT -- 28.9* -- 34.7* -- -- 34.2* --  PLT -- 162 -- 198 -- -- 199 --  APTT -- -- -- -- -- -- 71* --  LABPROT -- -- -- -- -- -- 13.3 --  INR -- -- -- -- -- -- 0.99 --  HEPARINUNFRC 0.83* 0.57 0.28* -- -- -- -- --  CREATININE -- -- -- 0.98 -- -- 0.92 0.91  CKTOTAL -- -- -- 103 112 129 -- --  CKMB -- -- -- 4.7* 5.1* 5.6* -- --  TROPONINI -- -- -- 0.96* 1.01* 1.22* -- --   Estimated Creatinine Clearance: 43.1 ml/min (by C-G formula based on Cr of 0.98).  Assessment: 71yo female on heparin 1200 units/hr with therapeutic Heparin Level this AM.  Repeated this afternoon to verify, now elevated at 0.83.  No bleeding problems noted.  Goal of Therapy:  Heparin level 0.3-0.7 units/ml   Plan:  1.  Decrease heparin to 1050 units/hr 2.  Check heparin level 6 hours  Gracy Bruins P 12/03/2011,4:00 PM   Addendum- will not repeat Heparin level as the drip will be turned off for surgery in the AM.

## 2011-12-03 NOTE — Progress Notes (Signed)
[facility]  Subjective: Chest pain last night during activity with radiation to neck.  Improved when Nitro titrated.  Objective: Vital signs in last 24 hours: Temp:  [97.6 F (36.4 C)-98.4 F (36.9 C)] 98.3 F (36.8 C) (02/08 0904) Pulse Rate:  [55-76] 55  (02/08 0904) Resp:  [12-24] 16  (02/08 0904) BP: (96-146)/(30-106) 119/43 mmHg (02/08 0904) SpO2:  [95 %-100 %] 98 % (02/08 0904) Last BM Date: 11/30/11  Intake/Output from previous day: 02/07 0701 - 02/08 0700 In: 3091.3 [P.O.:240; I.V.:2851.3] Out: -  Intake/Output this shift:    Medications Current Facility-Administered Medications  Medication Dose Route Frequency Provider Last Rate Last Dose  . 0.9 %  sodium chloride infusion  250 mL Intravenous PRN Erlene Quan, PA      . 0.9 %  sodium chloride infusion   Intravenous Continuous Troy Sine, MD 100 mL/hr at 12/02/11 2026    . acetaminophen (TYLENOL) tablet 1,000 mg  1,000 mg Oral Q6H PRN Erlene Quan, PA      . acetaminophen (TYLENOL) tablet 650 mg  650 mg Oral Q4H PRN Troy Sine, MD      . ALPRAZolam Duanne Moron) tablet 0.25 mg  0.25 mg Oral BID PRN Erlene Quan, PA      . aspirin EC tablet 81 mg  81 mg Oral Daily Doreene Burke [place], [place]      . gabapentin (NEURONTIN) capsule 300 mg  300 mg Oral TID Erlene Quan, PA   300 mg at 12/02/11 2153  . glimepiride (AMARYL) tablet 4 mg  4 mg Oral BID Doreene Burke [place], [place]   4 mg at 12/02/11 2154  . going for heart surgery book   Does not apply Once Leonie Man, MD      . heart attack bouncing book   Does not apply Once Leonie Man, MD      . heparin ADULT infusion 100 units/ml (25000 units/250 ml)  1,200 Units/hr Intravenous Continuous Leonie Man, MD 12 mL/hr at 12/03/11 0017 1,200 Units/hr at 12/03/11 0017  . insulin aspart (novoLOG) injection 0-5 Units  0-5 Units Subcutaneous QHS Doreene Burke [place], [place]   2 Units at 12/02/11 2151  . insulin aspart (novoLOG) injection 0-9 Units  0-9 Units  Subcutaneous TID WC Erlene Quan, PA   3 Units at 12/02/11 1750  . insulin glargine (LANTUS) injection 12 Units  12 Units Subcutaneous BID Tharon Aquas Trigt III, MD      . levothyroxine (SYNTHROID, LEVOTHROID) tablet 50 mcg  50 mcg Oral Custom Doreene Burke [place], [place]   50 mcg at 12/02/11 [phone]  . lisinopril (PRINIVIL,ZESTRIL) tablet 20 mg  20 mg Oral Daily Leonie Man, MD   20 mg at 12/02/11 1750  . magnesium hydroxide (MILK OF MAGNESIA) suspension 30 mL  30 mL Oral Daily PRN Leonie Man, MD   30 mL at 12/02/11 2154  . meclizine (ANTIVERT) tablet 25 mg  25 mg Oral QID PRN Erlene Quan, PA      . metoprolol tartrate (LOPRESSOR) tablet 25 mg  25 mg Oral BID Leonie Man, MD   25 mg at 12/02/11 2152  . morphine 2 MG/ML injection 2 mg  2 mg Intravenous Q2H PRN Troy Sine, MD      . mulitivitamin with minerals tablet 1 tablet  1 tablet Oral Daily Doreene Burke [place], [place]      . nitroGLYCERIN (NITROSTAT) SL tablet  0.4 mg  0.4 mg Sublingual Q5 min PRN Mervin Kung, MD      . nitroGLYCERIN 0.2 mg/mL in dextrose 5 % infusion  2-200 mcg/min Intravenous Continuous Troy Sine, MD 3 mL/hr at 12/03/11 0619 10 mcg/min at 12/03/11 [id number]  . ondansetron (ZOFRAN) injection 4 mg  4 mg Intravenous Q6H PRN Troy Sine, MD      . pantoprazole (PROTONIX) EC tablet 40 mg  40 mg Oral Daily Doreene Burke [place], [place]      . rosuvastatin (CRESTOR) tablet 20 mg  20 mg Oral Daily Doreene Burke [place], [place]      . sodium chloride 0.9 % injection 3 mL  3 mL Intravenous Q12H Doreene Burke [place], [place]   3 mL at 12/02/11 2158  . sodium chloride 0.9 % injection 3 mL  3 mL Intravenous PRN Erlene Quan, PA      . vitamin C (ASCORBIC ACID) tablet 500 mg  500 mg Oral Daily Leonie Man, MD      . white petrolatum (VASELINE) gel   Topical PRN Leonie Man, MD      . zolpidem [facility]) tablet 5 mg  5 mg Oral QHS PRN Erlene Quan, PA      . DISCONTD: 0.9 %  sodium chloride infusion  250 mL Intravenous PRN Erlene Quan, PA      . DISCONTD: 0.9  %  sodium chloride infusion   Intravenous Continuous Erlene Quan, [place] 75 mL/hr at 12/02/11 0419    . DISCONTD: heparin ADULT infusion 100 units/ml (25000 units/250 ml)  1,000 Units/hr Intravenous Continuous Leonie Man, MD 10 mL/hr at 12/02/11 0250 1,000 Units/hr at 12/02/11 0250  . DISCONTD: metoprolol tartrate (LOPRESSOR) tablet 12.5 mg  12.5 mg Oral BID Doreene Burke [place], [place]   12.5 mg at 12/01/11 2129  . DISCONTD: nitroGLYCERIN (NITRODUR - Dosed in mg/24 hr) patch 0.2 mg  0.2 mg Transdermal Daily Leonie Man, MD   0.2 mg at 12/01/11 2129  . DISCONTD: ondansetron (ZOFRAN) injection 4 mg  4 mg Intravenous Q6H PRN Erlene Quan, PA      . DISCONTD: sodium chloride 0.9 % injection 3 mL  3 mL Intravenous Q12H Doreene Burke [place], [place]      . DISCONTD: sodium chloride 0.9 % injection 3 mL  3 mL Intravenous PRN Erlene Quan, PA      . DISCONTD: white petrolatum (VASELINE) gel   Topical PRN Leonie Man, MD       Facility-Administered Medications Ordered in Other Encounters  Medication Dose Route Frequency Provider Last Rate Last Dose  . albuterol (PROVENTIL) (5 MG/ML) 0.5% nebulizer solution 2.5 mg  2.5 mg Nebulization Once Gaye Pollack, MD   2.5 mg at 12/02/11 1545    PE: General appearance: alert, cooperative and no distress Lungs: clear to auscultation bilaterally Heart: regular rate and rhythm, S1, S2 normal, no murmur, click, rub or gallop; no JVD Abd: soft, NT/ND/NABS, no HSM Extremities: No LEE Pulses: 2+ and symmetric.  Lab Results:   Basename 12/03/11 0447 12/02/11 0655 12/01/11 1856  WBC 8.2 8.7 10.1  HGB 10.0* 12.1 12.2  HCT 28.9* 34.7* 34.2*  PLT 162 198 199   BMET  Basename 12/02/11 0655 12/01/11 1856 12/01/11 1306  NA 140 141 141  K 4.4 4.2 4.3  CL 105 106 105  CO2 26 23 24   GLUCOSE 183* 154* 225*  BUN 18 14 15   CREATININE 0.98 0.92  0.91  CALCIUM 9.6 10.0 9.7   PT/INR  Basename 12/01/11 1856  LABPROT 13.3  INR 0.99   Cholesterol  Basename 12/02/11 0655    CHOL 254*   Cardiac Enzymes Tropnin I: 1.01,  0.96   Studies/Results: 2/Left heart cath LM: 20% ostial  LAD: 50% ostial extending to 99% followed by 70% proximal stenosis, 20 % mid  LCX: 80% ostial  RCA 40 - 50% mid  LV with mod anterolateral hypokinesis mid to distally extending around apex; EF 50%  CHEST - 2 VIEW  Comparison: 12/01/2011  Findings: Moderate cardiomegaly. Mild interstitial edema.  Vascular congestion. No pneumothorax. Tiny pleural effusions.  IMPRESSION:  Mild CHF and edema.   Assessment/Plan Principal Problem:  *NSTEMI (non-ST elevated myocardial infarction) Active Problems:  Hyperlipidemia LDL goal <70  GERD  DIABETES MELLITUS-TYPE II  HTN (hypertension)  Family history of early CAD  Plan:  Pain free.  LAD: 50% ostial extending to 99% followed by 70% proximal stenosis, 20 % mid  LCX: 80% ostial.   CABG planned.  Troponin slowly decreasing. Cr good.  BP 96/30 - 146/68.  Net I/O +3176.  Mild CHF on xray.  Sinus rhythm-60    LOS: 2 days    HARDING,DAVID W 12/03/2011 9:33 AM  I have seen and examined the patient along with Tarri Fuller, PA.  I have reviewed the chart, notes and new data.  I agree with Bryan's note.  Key new complaints: CP last PM walking to bathroom. OTW no SOB or CP @ rest. Key examination changes: relatively normal exam; BP borderline (hold parameters for ACE-I with NTG gtt on) Key new findings / data: Tropoinin trending down, CXR with mild interstitial edema (has been net + since arrival & will need to monitor)  Cr stable.   PLAN: Continue NTG gtt to keep CP free until CABG planned for AM; will ask for BS commode to avoid walking to bathroom BB, ACE-I and statin DM - Insulin SSI, Lantus (likley not use sulfonylurea post-op).  Will need to keep CP free until CABG.  Leonie Man, M.D., M.S. [facility] [address] [place], [place]  [address]  [phone]  12/03/2011 9:33 AM

## 2011-12-03 NOTE — Plan of Care (Signed)
Problem: Phase II Progression Outcomes Goal: Wean IV nitroglycerin to PO or topical Outcome: Progressing Currently down to 84mcg

## 2011-12-03 NOTE — Progress Notes (Signed)
  Echocardiogram 2D Echocardiogram has been performed.  Diamond Nickel Deneen 12/03/2011, 4:20 PM

## 2011-12-03 NOTE — Progress Notes (Signed)
1 Day Post-Op Procedure(s) (LRB): LEFT HEART CATHETERIZATION WITH CORONARY ANGIOGRAM (N/A) Subjective:                     [address]            [place] [address]          [phone]     Some chest pain on iv heparin  Objective: Vital signs in last 24 hours: Temp:  [98.1 F (36.7 C)-98.4 F (36.9 C)] 98.1 F (36.7 C) (02/08 1656) Pulse Rate:  [55-71] 62  (02/08 1656) Cardiac Rhythm:  [-] Sinus bradycardia (02/08 0800) Resp:  [16-18] 18  (02/08 1656) BP: (96-146)/(30-63) 146/63 mmHg (02/08 1656) SpO2:  [95 %-98 %] 98 % (02/08 1656)  Hemodynamic parameters for last 24 hours:  NSR  Intake/Output from previous day: 02/07 0701 - 02/08 0700 In: 3091.3 [P.O.:240; I.V.:2851.3] Out: -  Intake/Output this shift:    Lungs clear    extrem warm  Lab Results:  Basename 12/03/11 0447 12/02/11 0655  WBC 8.2 8.7  HGB 10.0* 12.1  HCT 28.9* 34.7*  PLT 162 198   BMET:  Basename 12/02/11 0655 12/01/11 1856  NA 140 141  K 4.4 4.2  CL 105 106  CO2 26 23  GLUCOSE 183* 154*  BUN 18 14  CREATININE 0.98 0.92  CALCIUM 9.6 10.0    PT/INR:  Basename 12/01/11 1856  LABPROT 13.3  INR 0.99   ABG No results found for this basename: phart, pco2, po2, hco3, tco2, acidbasedef, o2sat   CBG (last 3)   Basename 12/03/11 1653 12/03/11 1325 12/03/11 0801  GLUCAP 182* 206* 187*    Assessment/Plan: S/P Procedure(s) (LRB): LEFT HEART CATHETERIZATION WITH CORONARY ANGIOGRAM (N/A) Plan CABG in AM Will need blood transfusion in OR   LOS: 2 days    VAN TRIGT III,PETER 12/03/2011

## 2011-12-03 NOTE — Progress Notes (Signed)
VASCULAR LAB PRELIMINARY  PRELIMINARY  PRELIMINARY  PRELIMINARY  Pre-op Cardiac Surgery  Carotid Findings:  Bilateral:  No evidence of hemodynamically significant internal carotid artery stenosis.   Vertebral artery flow is antegrade.      Upper Extremity Right Left  Brachial Pressures 156 tri 134 tri  Radial Waveforms tri tri  Ulnar Waveforms tri tri  Palmar Arch (Allen's Test) Within normal limits  Normal with radial compression, obliterates with ulnar compression      Lower  Extremity Right Left  Dorsalis Pedis 175 tri 173 Tri  Anterior Tibial    Posterior Tibial 187 tri 175 tri  Ankle/Brachial Indices within normal limits  Within normal limits     [employer], [employer], [place], RVT 12/03/2011, 2:12 PM

## 2011-12-03 NOTE — Cardiovascular Report (Signed)
NAMEDITYA, PULTZ NO.:  [id number]  MEDICAL RECORD NO.:  [id number]  LOCATION:  2504                         FACILITY:  [facility]  PHYSICIAN:  Shelva Majestic, M.D.     DATE OF BIRTH:  05-05-1941  DATE OF PROCEDURE:  12/02/2011 DATE OF DISCHARGE:                           CARDIAC CATHETERIZATION   INDICATIONS:  [name] is a very pleasant 71 year old female who has a history of hypertension, type 2 diabetes mellitus, significant hyperlipidemia.  She developed chest pain leading to [facility] hospitalization.  She is ruled in for a non ST-segment elevation myocardial infarction and has involved significant T-wave abnormality in leads II, III, F, V3 through V6 as well as lead I.  She now presents for definitive diagnostic cardiac catheterizations.  PROCEDURE:  After premedication with Versed 2 mg plus fentanyl 25 mcg, the patient was prepped and draped in usual fashion.  Her right femoral artery was punctured anteriorly and a 5-French sheath was inserted without difficulty.  Diagnostic cardiac catheterization was done utilizing 5-French Judkins 4 left and right coronary catheters.  With the potential need for CABG revascularization surgery, selective angiography in the left subclavian and internal mammary artery were performed to assess suitability of the LIMA graft.  A 5-French pigtail catheter was then used for left ventriculography.  Distal aortography was also performed to evaluate for renovascular etiology to hypertension and make certain she does not have significant aortoiliac disease. Hemostasis was obtained by direct manual pressure.  HEMODYNAMIC DATA:  Central aortic pressure was 160/67, left ventricular pressure 116/17, post A-wave 21.  ANGIOGRAPHIC DATA:  Left main coronary artery was large-caliber vessel that had ostial narrowing of 20%.  The left main was large caliber and bifurcated into an LAD and left circumflex system.  The LAD had  ostial narrowing diffusely that was at least 50%.  The LAD proximally was then 99%, eccentrically narrowed in the region of a small first diagonal vessel.  This was then followed by another area of 70% stenosis before a septal perforating artery.  The LAD should extend and wraps around the LV apex and gave rise to additional diagonal vessel. In addition, there was mild narrowing of 20% after this mid diagonal take off.  The circumflex vessel had ostial narrowing of 80%, diffusely extending to the proximal region.  The circumflex vessel gave rise to one major bifurcating marginal vessel.  The right coronary artery was a dominant vessel.  There was irregularity with narrowing of 40-50% in the mid segment after takeoff of a small anterior RV marginal branch.  The RCA supplied the PDA and posterolateral vessel.  The left subclavian and internal mammary artery were normal.  RAO ventriculography revealed mild LV dysfunction with moderate hypocontractility extending from the mid anterolateral wall apically and wrapping around the LV apex to the distal inferior apical segment. Other walls contracted normally.  Distal aortography did not demonstrate any renal artery stenosis.  There was no significant aortoiliac disease.  IMPRESSION: 1. Mild left ventricular dysfunction with moderate hypocontractility     involving the mid distal anterolateral wall extending and wrapping     around the left ventricular apex. 2. Significant coronary artery disease with  ostial tapering of the     left main of 20% followed by left main equivalent disease with 50%     diffuse ostial narrowing of the left anterior descending artery     followed by 99% eccentric focal proximal left anterior descending     artery stenosis followed by 70% proximal left anterior descending     artery stenosis after the first diagonal and before the septal     perforating artery with 20% mid left anterior descending artery      narrowing; 80% ostial-to-proximal left circumflex stenosis and 40-     50% irregularity of the mid right coronary artery. 3. Normal aortoiliac system.  DISCUSSION:  [name] is a 71 year old female with cardiac risk factors including diabetes mellitus, hypertension, hyperlipidemia.  She has ruled in for non ST-segment elevation myocardial infarction and has moderate hypocontractility involving the mid distal anterolateral wall extending around the apex concordant with her subtotal LAD stenosis. However, the patient does have left main equivalent disease involving her LAD extending to the ostium as well as her left circumflex vessel in addition to 40-50% RCA stenosis.  Surgical consultation will be obtained for consideration of CABG revascularization surgery, which may provide best long-term management.          ______________________________ Shelva Majestic, M.D.     TK/MEDQ  D:  12/02/2011  T:  12/03/2011  Job:  [id number]  cc:   Rolland Porter, MD Haywood Pao, M.D. Jeanella Craze. Little, M.D.

## 2011-12-03 NOTE — Progress Notes (Signed)
ANTICOAGULATION CONSULT NOTE - Follow Up Consult  Pharmacy Consult for Heparin Indication: NSTEMI, s/p cath & awaiting CABG  Allergies  Allergen Reactions  . Epinephrine Other (See Comments)    Abnormal feeling    Patient Measurements: Height: 5' (152.4 cm) Weight: 135 lb 12.9 oz (61.6 kg) IBW/kg (Calculated) : 45.5   Vital Signs: Temp: 98.3 F (36.8 C) (02/08 1316) Temp src: Oral (02/08 1316) BP: 119/43 mmHg (02/08 0904) Pulse Rate: 55  (02/08 0904)  Labs:  Flo Shanks 12/03/11 0447 12/02/11 2311 12/02/11 0655 12/02/11 0034 12/02/11 0031 12/01/11 1857 12/01/11 1856 12/01/11 1306  HGB 10.0* -- 12.1 -- -- -- -- --  HCT 28.9* -- 34.7* -- -- -- 34.2* --  PLT 162 -- 198 -- -- -- 199 --  APTT -- -- -- -- -- -- 71* --  LABPROT -- -- -- -- -- -- 13.3 --  INR -- -- -- -- -- -- 0.99 --  HEPARINUNFRC 0.57 0.28* -- -- 0.16* -- -- --  CREATININE -- -- 0.98 -- -- -- 0.92 0.91  CKTOTAL -- -- 103 112 -- 129 -- --  CKMB -- -- 4.7* 5.1* -- 5.6* -- --  TROPONINI -- -- 0.96* 1.01* -- 1.22* -- --   Estimated Creatinine Clearance: 43.1 ml/min (by C-G formula based on Cr of 0.98).   Medications:  Scheduled:    . aspirin EC  81 mg Oral Daily  . gabapentin  300 mg Oral TID  . glimepiride  4 mg Oral BID  . going for heart surgery book   Does not apply Once  . heart attack bouncing book   Does not apply Once  . insulin aspart  0-5 Units Subcutaneous QHS  . insulin aspart  0-9 Units Subcutaneous TID WC  . insulin glargine  12 Units Subcutaneous BID  . levothyroxine  50 mcg Oral Custom  . lisinopril  20 mg Oral Daily  . metoprolol tartrate  25 mg Oral BID  . mulitivitamin with minerals  1 tablet Oral Daily  . pantoprazole  40 mg Oral Daily  . rosuvastatin  20 mg Oral Daily  . sodium chloride  3 mL Intravenous Q12H  . vitamin C  500 mg Oral Daily    Assessment: 71yo female with NSTEMI, now s/p cath & awaiting CABG.  Heparin level is therapeutic at 0.57 on current rate, with no bleeding  problems noted.  Hg & pltc have decreased.  Goal of Therapy:  Heparin level 0.3-0.7 units/ml   Plan:  1.  Repeat Heparin level to verify therapeutic on current rate. 2.  Watch CBC  Hiatt, Kendra P 12/03/2011,1:54 PM

## 2011-12-04 ENCOUNTER — Encounter (HOSPITAL_COMMUNITY): Payer: Self-pay | Admitting: Anesthesiology

## 2011-12-04 ENCOUNTER — Encounter (HOSPITAL_COMMUNITY)
Admission: EM | Disposition: A | Discharge status: Home Health | Payer: Self-pay | Source: Home / Self Care | Attending: Cardiothoracic Surgery

## 2011-12-04 ENCOUNTER — Inpatient Hospital Stay (HOSPITAL_COMMUNITY): Payer: Medicare Other | Admitting: Anesthesiology

## 2011-12-04 ENCOUNTER — Inpatient Hospital Stay (HOSPITAL_COMMUNITY): Payer: Medicare Other

## 2011-12-04 DIAGNOSIS — I2510 Atherosclerotic heart disease of native coronary artery without angina pectoris: Secondary | ICD-10-CM

## 2011-12-04 HISTORY — PX: CORONARY ARTERY BYPASS GRAFT: SHX141

## 2011-12-04 LAB — BASIC METABOLIC PANEL
BUN: 16 mg/dL (ref 6–23)
CO2: 22 mEq/L (ref 19–32)
Calcium: 8.6 mg/dL (ref 8.4–10.5)
Chloride: 106 mEq/L (ref 96–112)
Creatinine, Ser: 0.94 mg/dL (ref 0.50–1.10)
GFR calc Af Amer: 69 mL/min — ABNORMAL LOW (ref >90)
GFR calc non Af Amer: 60 mL/min — ABNORMAL LOW (ref >90)
Glucose, Bld: 139 mg/dL — ABNORMAL HIGH (ref 70–99)
Potassium: 3.9 mEq/L (ref 3.5–5.1)
Sodium: 137 mEq/L (ref 135–145)

## 2011-12-04 LAB — BLOOD GAS, ARTERIAL
Acid-base deficit: 2 mmol/L (ref 0.0–2.0)
Bicarbonate: 21.9 mEq/L (ref 20.0–24.0)
Drawn by: 23604
FIO2: 0.21 %
O2 Saturation: 94.8 %
Patient temperature: 98.6
TCO2: 22.9 mmol/L (ref 0–100)
pCO2 arterial: 34.9 mmHg — ABNORMAL LOW (ref 35.0–45.0)
pH, Arterial: 7.413 — ABNORMAL HIGH (ref 7.350–7.400)
pO2, Arterial: 71 mmHg — ABNORMAL LOW (ref 80.0–100.0)

## 2011-12-04 LAB — POCT I-STAT 3, ART BLOOD GAS (G3+)
Acid-base deficit: 4 mmol/L — ABNORMAL HIGH (ref 0.0–2.0)
Acid-base deficit: 4 mmol/L — ABNORMAL HIGH (ref 0.0–2.0)
Acid-base deficit: 2 mmol/L (ref 0.0–2.0)
Acid-base deficit: 5 mmol/L — ABNORMAL HIGH (ref 0.0–2.0)
Bicarbonate: 20.8 mEq/L (ref 20.0–24.0)
Bicarbonate: 20.3 mEq/L (ref 20.0–24.0)
Bicarbonate: 22.6 mEq/L (ref 20.0–24.0)
Bicarbonate: 20.8 mEq/L (ref 20.0–24.0)
O2 Saturation: 100 %
O2 Saturation: 100 %
O2 Saturation: 97 %
O2 Saturation: 97 %
Patient temperature: 35.5
Patient temperature: 36.9
TCO2: 22 mmol/L (ref 0–100)
TCO2: 21 mmol/L (ref 0–100)
TCO2: 24 mmol/L (ref 0–100)
TCO2: 22 mmol/L (ref 0–100)
pCO2 arterial: 37.7 mmHg (ref 35.0–45.0)
pCO2 arterial: 34.4 mmHg — ABNORMAL LOW (ref 35.0–45.0)
pCO2 arterial: 35.9 mmHg (ref 35.0–45.0)
pCO2 arterial: 38.9 mmHg (ref 35.0–45.0)
pH, Arterial: 7.349 — ABNORMAL LOW (ref 7.350–7.400)
pH, Arterial: 7.378 (ref 7.350–7.400)
pH, Arterial: 7.4 (ref 7.350–7.400)
pH, Arterial: 7.335 — ABNORMAL LOW (ref 7.350–7.400)
pO2, Arterial: 423 mmHg — ABNORMAL HIGH (ref 80.0–100.0)
pO2, Arterial: 353 mmHg — ABNORMAL HIGH (ref 80.0–100.0)
pO2, Arterial: 85 mmHg (ref 80.0–100.0)
pO2, Arterial: 96 mmHg (ref 80.0–100.0)

## 2011-12-04 LAB — POCT I-STAT 4, (NA,K, GLUC, HGB,HCT)
Glucose, Bld: 121 mg/dL — ABNORMAL HIGH (ref 70–99)
Glucose, Bld: 120 mg/dL — ABNORMAL HIGH (ref 70–99)
Glucose, Bld: 135 mg/dL — ABNORMAL HIGH (ref 70–99)
Glucose, Bld: 138 mg/dL — ABNORMAL HIGH (ref 70–99)
Glucose, Bld: 152 mg/dL — ABNORMAL HIGH (ref 70–99)
Glucose, Bld: 154 mg/dL — ABNORMAL HIGH (ref 70–99)
Glucose, Bld: 194 mg/dL — ABNORMAL HIGH (ref 70–99)
Glucose, Bld: 118 mg/dL — ABNORMAL HIGH (ref 70–99)
Glucose, Bld: 201 mg/dL — ABNORMAL HIGH (ref 70–99)
HCT: 25 % — ABNORMAL LOW (ref 36.0–46.0)
HCT: 20 % — ABNORMAL LOW (ref 36.0–46.0)
HCT: 20 % — ABNORMAL LOW (ref 36.0–46.0)
HCT: 22 % — ABNORMAL LOW (ref 36.0–46.0)
HCT: 28 % — ABNORMAL LOW (ref 36.0–46.0)
HCT: 27 % — ABNORMAL LOW (ref 36.0–46.0)
HCT: 25 % — ABNORMAL LOW (ref 36.0–46.0)
HCT: 29 % — ABNORMAL LOW (ref 36.0–46.0)
HCT: 24 % — ABNORMAL LOW (ref 36.0–46.0)
Hemoglobin: 8.5 g/dL — ABNORMAL LOW (ref 12.0–15.0)
Hemoglobin: 6.8 g/dL — CL (ref 12.0–15.0)
Hemoglobin: 6.8 g/dL — CL (ref 12.0–15.0)
Hemoglobin: 7.5 g/dL — ABNORMAL LOW (ref 12.0–15.0)
Hemoglobin: 9.5 g/dL — ABNORMAL LOW (ref 12.0–15.0)
Hemoglobin: 9.2 g/dL — ABNORMAL LOW (ref 12.0–15.0)
Hemoglobin: 8.5 g/dL — ABNORMAL LOW (ref 12.0–15.0)
Hemoglobin: 9.9 g/dL — ABNORMAL LOW (ref 12.0–15.0)
Hemoglobin: 8.2 g/dL — ABNORMAL LOW (ref 12.0–15.0)
Potassium: 4.1 mEq/L (ref 3.5–5.1)
Potassium: 3.5 mEq/L (ref 3.5–5.1)
Potassium: 3.8 mEq/L (ref 3.5–5.1)
Potassium: 4 mEq/L (ref 3.5–5.1)
Potassium: 4.2 mEq/L (ref 3.5–5.1)
Potassium: 4.8 mEq/L (ref 3.5–5.1)
Potassium: 4 mEq/L (ref 3.5–5.1)
Potassium: 3.4 mEq/L — ABNORMAL LOW (ref 3.5–5.1)
Potassium: 4.3 mEq/L (ref 3.5–5.1)
Sodium: 141 mEq/L (ref 135–145)
Sodium: 133 mEq/L — ABNORMAL LOW (ref 135–145)
Sodium: 135 mEq/L (ref 135–145)
Sodium: 141 mEq/L (ref 135–145)
Sodium: 135 mEq/L (ref 135–145)
Sodium: 140 mEq/L (ref 135–145)
Sodium: 139 mEq/L (ref 135–145)
Sodium: 142 mEq/L (ref 135–145)
Sodium: 141 mEq/L (ref 135–145)

## 2011-12-04 LAB — CBC
HCT: 27.2 % — ABNORMAL LOW (ref 36.0–46.0)
HCT: 29.3 % — ABNORMAL LOW (ref 36.0–46.0)
HCT: 28.8 % — ABNORMAL LOW (ref 36.0–46.0)
Hemoglobin: 9.5 g/dL — ABNORMAL LOW (ref 12.0–15.0)
Hemoglobin: 10.2 g/dL — ABNORMAL LOW (ref 12.0–15.0)
Hemoglobin: 10.2 g/dL — ABNORMAL LOW (ref 12.0–15.0)
MCH: 32.6 pg (ref 26.0–34.0)
MCH: 31.8 pg (ref 26.0–34.0)
MCH: 32.4 pg (ref 26.0–34.0)
MCHC: 34.9 g/dL (ref 30.0–36.0)
MCHC: 34.8 g/dL (ref 30.0–36.0)
MCHC: 35.4 g/dL (ref 30.0–36.0)
MCV: 93.5 fL (ref 78.0–100.0)
MCV: 91.3 fL (ref 78.0–100.0)
MCV: 91.4 fL (ref 78.0–100.0)
Platelets: 155 10*3/uL (ref 150–400)
Platelets: 94 10*3/uL — ABNORMAL LOW (ref 150–400)
Platelets: 103 10*3/uL — ABNORMAL LOW (ref 150–400)
RBC: 2.91 MIL/uL — ABNORMAL LOW (ref 3.87–5.11)
RBC: 3.21 MIL/uL — ABNORMAL LOW (ref 3.87–5.11)
RBC: 3.15 MIL/uL — ABNORMAL LOW (ref 3.87–5.11)
RDW: 13.1 % (ref 11.5–15.5)
RDW: 13.3 % (ref 11.5–15.5)
RDW: 13.5 % (ref 11.5–15.5)
WBC: 9.9 10*3/uL (ref 4.0–10.5)
WBC: 9.9 10*3/uL (ref 4.0–10.5)
WBC: 8.9 10*3/uL (ref 4.0–10.5)

## 2011-12-04 LAB — CREATININE, SERUM
Creatinine, Ser: 0.79 mg/dL (ref 0.50–1.10)
GFR calc Af Amer: >90 mL/min (ref >90)
GFR calc non Af Amer: 82 mL/min — ABNORMAL LOW (ref >90)

## 2011-12-04 LAB — POCT I-STAT, CHEM 8
BUN: 9 mg/dL (ref 6–23)
Calcium, Ion: 1.07 mmol/L — ABNORMAL LOW (ref 1.12–1.32)
Chloride: 110 mEq/L (ref 96–112)
Creatinine, Ser: 0.9 mg/dL (ref 0.50–1.10)
Glucose, Bld: 152 mg/dL — ABNORMAL HIGH (ref 70–99)
HCT: 26 % — ABNORMAL LOW (ref 36.0–46.0)
Hemoglobin: 8.8 g/dL — ABNORMAL LOW (ref 12.0–15.0)
Potassium: 3.8 mEq/L (ref 3.5–5.1)
Sodium: 141 mEq/L (ref 135–145)
TCO2: 22 mmol/L (ref 0–100)

## 2011-12-04 LAB — POCT I-STAT GLUCOSE
Glucose, Bld: 126 mg/dL — ABNORMAL HIGH (ref 70–99)
Operator id: 156951

## 2011-12-04 LAB — MAGNESIUM: Magnesium: 2.7 mg/dL — ABNORMAL HIGH (ref 1.5–2.5)

## 2011-12-04 LAB — GLUCOSE, CAPILLARY
Glucose-Capillary: 86 mg/dL (ref 70–99)
Glucose-Capillary: 87 mg/dL (ref 70–99)
Glucose-Capillary: 80 mg/dL (ref 70–99)
Glucose-Capillary: 97 mg/dL (ref 70–99)

## 2011-12-04 LAB — APTT: aPTT: 35 seconds (ref 24–37)

## 2011-12-04 LAB — PLATELET COUNT: Platelets: 97 10*3/uL — ABNORMAL LOW (ref 150–400)

## 2011-12-04 LAB — PROTIME-INR
INR: 1.34 (ref 0.00–1.49)
Prothrombin Time: 16.8 seconds — ABNORMAL HIGH (ref 11.6–15.2)

## 2011-12-04 LAB — HEPARIN LEVEL (UNFRACTIONATED): Heparin Unfractionated: 0.54 IU/mL (ref 0.30–0.70)

## 2011-12-04 LAB — HEMOGLOBIN AND HEMATOCRIT, BLOOD
HCT: 27.6 % — ABNORMAL LOW (ref 36.0–46.0)
Hemoglobin: 9.7 g/dL — ABNORMAL LOW (ref 12.0–15.0)

## 2011-12-04 LAB — PREPARE RBC (CROSSMATCH)

## 2011-12-04 SURGERY — CORONARY ARTERY BYPASS GRAFTING (CABG)
Anesthesia: General | Site: Chest | Wound class: Clean

## 2011-12-04 MED ORDER — MIDAZOLAM HCL 5 MG/5ML IJ SOLN
INTRAMUSCULAR | Status: DC | PRN
  Administered 2011-12-04 (×5): 2 mg via INTRAVENOUS

## 2011-12-04 MED ORDER — SODIUM CHLORIDE 0.9 % IV SOLN: INTRAVENOUS | Status: DC | PRN

## 2011-12-04 MED ORDER — MAGNESIUM SULFATE 40 MG/ML IJ SOLN
4.0000 g | Freq: Once | INTRAMUSCULAR | Status: AC
  Administered 2011-12-04: 4 g via INTRAVENOUS

## 2011-12-04 MED ORDER — OXYCODONE HCL 5 MG PO TABS: 5.0000 mg | ORAL_TABLET | ORAL | Status: DC | PRN

## 2011-12-04 MED ORDER — ROCURONIUM BROMIDE 100 MG/10ML IV SOLN
INTRAVENOUS | Status: DC | PRN
  Administered 2011-12-04: 50 mg via INTRAVENOUS
  Administered 2011-12-04: 70 mg via INTRAVENOUS
  Administered 2011-12-04: 25 mg via INTRAVENOUS

## 2011-12-04 MED ORDER — 0.9 % SODIUM CHLORIDE (POUR BTL) OPTIME
TOPICAL | Status: DC | PRN
  Administered 2011-12-04: 1000 mL

## 2011-12-04 MED ORDER — METOPROLOL TARTRATE 1 MG/ML IV SOLN: 2.5000 mg | INTRAVENOUS | Status: DC | PRN

## 2011-12-04 MED ORDER — ACETAMINOPHEN 160 MG/5ML PO SOLN: 650.0000 mg | ORAL | Status: AC

## 2011-12-04 MED ORDER — MORPHINE SULFATE 2 MG/ML IJ SOLN
1.0000 mg | INTRAMUSCULAR | Status: AC | PRN
  Administered 2011-12-04: 2 mg via INTRAVENOUS
  Filled 2011-12-04: qty 1

## 2011-12-04 MED ORDER — HEPARIN SODIUM (PORCINE) 1000 UNIT/ML IJ SOLN
INTRAMUSCULAR | Status: DC | PRN
  Administered 2011-12-04: 5000 [IU] via INTRAVENOUS
  Administered 2011-12-04: 19000 [IU] via INTRAVENOUS

## 2011-12-04 MED ORDER — SODIUM CHLORIDE 0.9 % IV SOLN
INTRAVENOUS | Status: DC
  Administered 2011-12-05: 4 [IU]/h via INTRAVENOUS
  Administered 2011-12-05: 6.2 [IU]/h via INTRAVENOUS
  Administered 2011-12-05: 6.7 [IU]/h via INTRAVENOUS
  Administered 2011-12-05: 4.4 [IU]/h via INTRAVENOUS
  Filled 2011-12-04: qty 1

## 2011-12-04 MED ORDER — SODIUM CHLORIDE 0.9 % IV SOLN
10.0000 g | INTRAVENOUS | Status: DC | PRN
  Administered 2011-12-04: 5 g/h via INTRAVENOUS

## 2011-12-04 MED ORDER — ASPIRIN 81 MG PO CHEW: 324.0000 mg | CHEWABLE_TABLET | Freq: Every day | ORAL | Status: DC

## 2011-12-04 MED ORDER — PROTAMINE SULFATE 10 MG/ML IV SOLN
INTRAVENOUS | Status: DC | PRN
  Administered 2011-12-04: 10 mg via INTRAVENOUS
  Administered 2011-12-04: 190 mg via INTRAVENOUS

## 2011-12-04 MED ORDER — PROPOFOL 10 MG/ML IV EMUL
INTRAVENOUS | Status: DC | PRN
  Administered 2011-12-04: 100 mg via INTRAVENOUS

## 2011-12-04 MED ORDER — INSULIN REGULAR BOLUS VIA INFUSION
0.0000 [IU] | Freq: Three times a day (TID) | INTRAVENOUS | Status: DC
  Filled 2011-12-04: qty 10

## 2011-12-04 MED ORDER — SODIUM CHLORIDE 0.9 % IV SOLN
100.0000 [IU] | INTRAVENOUS | Status: DC | PRN
  Administered 2011-12-04 (×2): 1.8 [IU]/h via INTRAVENOUS

## 2011-12-04 MED ORDER — ASPIRIN EC 325 MG PO TBEC
325.0000 mg | DELAYED_RELEASE_TABLET | Freq: Every day | ORAL | Status: DC
  Administered 2011-12-06 – 2011-12-10 (×5): 325 mg via ORAL
  Filled 2011-12-04 (×6): qty 1

## 2011-12-04 MED ORDER — DOPAMINE-DEXTROSE 1.6-5 MG/ML-% IV SOLN
INTRAVENOUS | Status: DC | PRN
  Administered 2011-12-04: 3 ug/kg/min via INTRAVENOUS

## 2011-12-04 MED ORDER — METOPROLOL TARTRATE 12.5 MG HALF TABLET
12.5000 mg | ORAL_TABLET | Freq: Two times a day (BID) | ORAL | Status: DC
  Administered 2011-12-05 – 2011-12-06 (×2): 12.5 mg via ORAL
  Filled 2011-12-04 (×5): qty 1

## 2011-12-04 MED ORDER — METOPROLOL TARTRATE 25 MG/10 ML ORAL SUSPENSION
12.5000 mg | Freq: Two times a day (BID) | ORAL | Status: DC
Start: 2011-12-04 — End: 2011-12-06
  Administered 2011-12-04: 12.5 mg
  Filled 2011-12-04 (×5): qty 5

## 2011-12-04 MED ORDER — FENTANYL CITRATE 0.05 MG/ML IJ SOLN
INTRAMUSCULAR | Status: DC | PRN
  Administered 2011-12-04: 150 ug via INTRAVENOUS
  Administered 2011-12-04 (×2): 250 ug via INTRAVENOUS
  Administered 2011-12-04: 100 ug via INTRAVENOUS
  Administered 2011-12-04: 250 ug via INTRAVENOUS
  Administered 2011-12-04: 50 ug via INTRAVENOUS

## 2011-12-04 MED ORDER — VANCOMYCIN HCL 1000 MG IV SOLR
1000.0000 mg | Freq: Once | INTRAVENOUS | Status: AC
  Administered 2011-12-04: 1000 mg via INTRAVENOUS
  Filled 2011-12-04: qty 1000

## 2011-12-04 MED ORDER — LACTATED RINGERS IV SOLN
INTRAVENOUS | Status: DC | PRN
  Administered 2011-12-04 (×2): via INTRAVENOUS

## 2011-12-04 MED ORDER — NITROGLYCERIN IN D5W 200-5 MCG/ML-% IV SOLN
INTRAVENOUS | Status: DC | PRN
  Administered 2011-12-04: 15 ug/min via INTRAVENOUS

## 2011-12-04 MED ORDER — HEMOSTATIC AGENTS (NO CHARGE) OPTIME
TOPICAL | Status: DC | PRN
  Administered 2011-12-04: 3 via TOPICAL

## 2011-12-04 MED ORDER — POTASSIUM CHLORIDE 10 MEQ/50ML IV SOLN
10.0000 meq | INTRAVENOUS | Status: AC
  Administered 2011-12-04 (×3): 10 meq via INTRAVENOUS

## 2011-12-04 MED ORDER — HEMOSTATIC AGENTS (NO CHARGE) OPTIME
TOPICAL | Status: DC | PRN
  Administered 2011-12-04: 1 via TOPICAL

## 2011-12-04 MED ORDER — DOCUSATE SODIUM 100 MG PO CAPS
200.0000 mg | ORAL_CAPSULE | Freq: Every day | ORAL | Status: DC
  Administered 2011-12-06 – 2011-12-10 (×5): 200 mg via ORAL
  Filled 2011-12-04 (×4): qty 2

## 2011-12-04 MED ORDER — CALCIUM CHLORIDE 10 % IV SOLN
1.0000 g | Freq: Once | INTRAVENOUS | Status: AC | PRN
  Filled 2011-12-04: qty 10

## 2011-12-04 MED ORDER — SODIUM CHLORIDE 0.9 % IV SOLN
0.1000 ug/kg/h | INTRAVENOUS | Status: DC
  Administered 2011-12-04: 0.044 ug/kg/h via INTRAVENOUS
  Filled 2011-12-04: qty 2

## 2011-12-04 MED ORDER — SODIUM CHLORIDE 0.9 % IV SOLN: INTRAVENOUS | Status: DC

## 2011-12-04 MED ORDER — ALBUMIN HUMAN 5 % IV SOLN
250.0000 mL | INTRAVENOUS | Status: AC | PRN
  Administered 2011-12-04 – 2011-12-05 (×3): 250 mL via INTRAVENOUS
  Filled 2011-12-04: qty 250

## 2011-12-04 MED ORDER — SODIUM CHLORIDE 0.45 % IV SOLN
INTRAVENOUS | Status: DC
  Administered 2011-12-04 – 2011-12-05 (×3): via INTRAVENOUS

## 2011-12-04 MED ORDER — ACETAMINOPHEN 650 MG RE SUPP
650.0000 mg | RECTAL | Status: AC
  Administered 2011-12-04: 650 mg via RECTAL

## 2011-12-04 MED ORDER — PHENYLEPHRINE HCL 10 MG/ML IJ SOLN: 0.0000 ug/min | INTRAVENOUS | Status: DC

## 2011-12-04 MED ORDER — NITROGLYCERIN IN D5W 200-5 MCG/ML-% IV SOLN
0.0000 ug/min | INTRAVENOUS | Status: DC
  Administered 2011-12-04: 33.333 ug/min via INTRAVENOUS

## 2011-12-04 MED ORDER — INSULIN ASPART 100 UNIT/ML ~~LOC~~ SOLN
0.0000 [IU] | SUBCUTANEOUS | Status: DC
  Administered 2011-12-04: 2 [IU] via SUBCUTANEOUS
  Administered 2011-12-04: 4 [IU] via SUBCUTANEOUS
  Administered 2011-12-05: 2 [IU] via SUBCUTANEOUS
  Administered 2011-12-05: 4 [IU] via SUBCUTANEOUS
  Filled 2011-12-04: qty 3

## 2011-12-04 MED ORDER — FUROSEMIDE 10 MG/ML IJ SOLN
20.0000 mg | Freq: Once | INTRAMUSCULAR | Status: AC
  Administered 2011-12-05: 20 mg via INTRAVENOUS
  Filled 2011-12-04: qty 2

## 2011-12-04 MED ORDER — ONDANSETRON HCL 4 MG/2ML IJ SOLN: 4.0000 mg | Freq: Four times a day (QID) | INTRAMUSCULAR | Status: DC | PRN

## 2011-12-04 MED ORDER — DEXTROSE 5 % IV SOLN
INTRAVENOUS | Status: DC | PRN
  Administered 2011-12-04: 08:00:00 via INTRAVENOUS

## 2011-12-04 MED ORDER — DEXTROSE 5 % IV SOLN
30.0000 ug/min | Freq: Once | INTRAVENOUS | Status: DC
  Filled 2011-12-04: qty 2

## 2011-12-04 MED ORDER — SODIUM CHLORIDE 0.9 % IJ SOLN
3.0000 mL | Freq: Two times a day (BID) | INTRAMUSCULAR | Status: DC
  Administered 2011-12-05 (×2): 3 mL via INTRAVENOUS

## 2011-12-04 MED ORDER — SODIUM CHLORIDE 0.9 % IV SOLN: 250.0000 mL | INTRAVENOUS | Status: DC

## 2011-12-04 MED ORDER — PHENYLEPHRINE HCL 10 MG/ML IJ SOLN
20.0000 mg | INTRAVENOUS | Status: DC | PRN
  Administered 2011-12-04: 10 ug/min via INTRAVENOUS

## 2011-12-04 MED ORDER — DOPAMINE-DEXTROSE 3.2-5 MG/ML-% IV SOLN
2.5000 ug/kg/min | INTRAVENOUS | Status: DC
  Administered 2011-12-05: 2.5 ug/kg/min via INTRAVENOUS

## 2011-12-04 MED ORDER — LACTATED RINGERS IV SOLN
INTRAVENOUS | Status: DC
  Administered 2011-12-04: 14:00:00 via INTRAVENOUS

## 2011-12-04 MED ORDER — BISACODYL 10 MG RE SUPP: 10.0000 mg | Freq: Every day | RECTAL | Status: DC

## 2011-12-04 MED ORDER — DEXTROSE 5 % IV SOLN
1.5000 g | Freq: Two times a day (BID) | INTRAVENOUS | Status: AC
  Administered 2011-12-04 – 2011-12-06 (×4): 1.5 g via INTRAVENOUS
  Filled 2011-12-04 (×4): qty 1.5

## 2011-12-04 MED ORDER — ACETAMINOPHEN 160 MG/5ML PO SOLN
975.0000 mg | Freq: Four times a day (QID) | ORAL | Status: DC
  Administered 2011-12-04: 975 mg
  Filled 2011-12-04 (×2): qty 40.6

## 2011-12-04 MED ORDER — LACTATED RINGERS IV SOLN
INTRAVENOUS | Status: DC | PRN
  Administered 2011-12-04 (×2): via INTRAVENOUS

## 2011-12-04 MED ORDER — PANTOPRAZOLE SODIUM 40 MG PO TBEC
40.0000 mg | DELAYED_RELEASE_TABLET | Freq: Every day | ORAL | Status: DC
  Administered 2011-12-06: 40 mg via ORAL
  Filled 2011-12-04: qty 1

## 2011-12-04 MED ORDER — MORPHINE SULFATE 4 MG/ML IJ SOLN: 2.0000 mg | INTRAMUSCULAR | Status: DC | PRN

## 2011-12-04 MED ORDER — FAMOTIDINE IN NACL 20-0.9 MG/50ML-% IV SOLN
20.0000 mg | Freq: Two times a day (BID) | INTRAVENOUS | Status: AC
  Administered 2011-12-04: 20 mg via INTRAVENOUS

## 2011-12-04 MED ORDER — SODIUM CHLORIDE 0.9 % IJ SOLN: 3.0000 mL | INTRAMUSCULAR | Status: DC | PRN

## 2011-12-04 MED ORDER — BISACODYL 5 MG PO TBEC
10.0000 mg | DELAYED_RELEASE_TABLET | Freq: Every day | ORAL | Status: DC
  Administered 2011-12-06 – 2011-12-08 (×3): 10 mg via ORAL
  Filled 2011-12-04 (×3): qty 2

## 2011-12-04 MED ORDER — MIDAZOLAM HCL 2 MG/2ML IJ SOLN: 2.0000 mg | INTRAMUSCULAR | Status: DC | PRN

## 2011-12-04 MED ORDER — ACETAMINOPHEN 500 MG PO TABS
1000.0000 mg | ORAL_TABLET | Freq: Four times a day (QID) | ORAL | Status: DC
  Administered 2011-12-05 – 2011-12-07 (×5): 1000 mg via ORAL
  Filled 2011-12-04 (×12): qty 2

## 2011-12-04 SURGICAL SUPPLY — 131 items
ADAPTER CARDIO PERF ANTE/RETRO (ADAPTER) ×3 IMPLANT
ADAPTER MULTI PERFUSION 15 (ADAPTER) ×3 IMPLANT
APPLIER CLIP 9.375 MED OPEN (MISCELLANEOUS)
APPLIER CLIP 9.375 SM OPEN (CLIP)
ATTRACTOMAT 16X20 MAGNETIC DRP (DRAPES) ×3 IMPLANT
BAG DECANTER FOR FLEXI CONT (MISCELLANEOUS) ×3 IMPLANT
BANDAGE ELASTIC 4 VELCRO ST LF (GAUZE/BANDAGES/DRESSINGS) ×3 IMPLANT
BANDAGE ELASTIC 6 VELCRO ST LF (GAUZE/BANDAGES/DRESSINGS) ×3 IMPLANT
BANDAGE GAUZE ELAST BULKY 4 IN (GAUZE/BANDAGES/DRESSINGS) ×3 IMPLANT
BASKET HEART  (ORDER IN 25'S) (MISCELLANEOUS) ×1
BASKET HEART (ORDER IN 25'S) (MISCELLANEOUS) ×1
BASKET HEART (ORDER IN 25S) (MISCELLANEOUS) ×1 IMPLANT
BLADE MINI RND TIP GREEN BEAV (BLADE) ×3 IMPLANT
BLADE SAW STERNAL (BLADE) ×3 IMPLANT
BLADE SURG 11 STRL SS (BLADE) ×3 IMPLANT
BLADE SURG 12 STRL SS (BLADE) ×3 IMPLANT
BLADE SURG ROTATE 9660 (MISCELLANEOUS) ×3 IMPLANT
CANISTER SUCTION 2500CC (MISCELLANEOUS) ×3 IMPLANT
CANNULA GUNDRY RCSP 15FR (MISCELLANEOUS) ×3 IMPLANT
CATH CPB KIT VANTRIGT (MISCELLANEOUS) ×3 IMPLANT
CATH ROBINSON RED A/P 18FR (CATHETERS) ×9 IMPLANT
CATH THORACIC 28FR (CATHETERS) IMPLANT
CATH THORACIC 28FR RT ANG (CATHETERS) IMPLANT
CATH THORACIC 36FR (CATHETERS) IMPLANT
CATH THORACIC 36FR RT ANG (CATHETERS) ×6 IMPLANT
CLIP APPLIE 9.375 MED OPEN (MISCELLANEOUS) IMPLANT
CLIP APPLIE 9.375 SM OPEN (CLIP) IMPLANT
CLIP FOGARTY SPRING 6M (CLIP) IMPLANT
CLIP RETRACTION 3.0MM CORONARY (MISCELLANEOUS) ×3 IMPLANT
CLIP TI MEDIUM 24 (CLIP) IMPLANT
CLIP TI WIDE RED SMALL 24 (CLIP) ×3 IMPLANT
CLOTH BEACON ORANGE TIMEOUT ST (SAFETY) ×3 IMPLANT
CONN Y 3/8X3/8X3/8  BEN (MISCELLANEOUS)
CONN Y 3/8X3/8X3/8 BEN (MISCELLANEOUS) IMPLANT
COVER SURGICAL LIGHT HANDLE (MISCELLANEOUS) ×6 IMPLANT
CRADLE DONUT ADULT HEAD (MISCELLANEOUS) ×3 IMPLANT
DRAIN CHANNEL 32F RND 10.7 FF (WOUND CARE) ×3 IMPLANT
DRAPE CARDIOVASCULAR INCISE (DRAPES) ×2
DRAPE SLUSH MACHINE 52X66 (DRAPES) IMPLANT
DRAPE SLUSH/WARMER DISC (DRAPES) IMPLANT
DRAPE SRG 135X102X78XABS (DRAPES) ×1 IMPLANT
DRSG COVADERM 4X14 (GAUZE/BANDAGES/DRESSINGS) ×3 IMPLANT
ELECT BLADE 4.0 EZ CLEAN MEGAD (MISCELLANEOUS) ×3
ELECT BLADE 6.5 EXT (BLADE) ×3 IMPLANT
ELECT CAUTERY BLADE 6.4 (BLADE) ×3 IMPLANT
ELECT REM PT RETURN 9FT ADLT (ELECTROSURGICAL) ×6
ELECTRODE BLDE 4.0 EZ CLN MEGD (MISCELLANEOUS) ×1 IMPLANT
ELECTRODE REM PT RTRN 9FT ADLT (ELECTROSURGICAL) ×2 IMPLANT
GLOVE BIO SURGEON STRL SZ 6 (GLOVE) ×9 IMPLANT
GLOVE BIO SURGEON STRL SZ 6.5 (GLOVE) IMPLANT
GLOVE BIO SURGEON STRL SZ7 (GLOVE) IMPLANT
GLOVE BIO SURGEON STRL SZ7.5 (GLOVE) ×9 IMPLANT
GLOVE BIO SURGEON STRL SZ8 (GLOVE) ×3 IMPLANT
GLOVE BIO SURGEONS STRL SZ 6.5 (GLOVE)
GLOVE BIOGEL PI IND STRL 6 (GLOVE) ×2 IMPLANT
GLOVE BIOGEL PI IND STRL 6.5 (GLOVE) ×3 IMPLANT
GLOVE BIOGEL PI IND STRL 7.0 (GLOVE) IMPLANT
GLOVE BIOGEL PI INDICATOR 6 (GLOVE) ×4
GLOVE BIOGEL PI INDICATOR 6.5 (GLOVE) ×6
GLOVE BIOGEL PI INDICATOR 7.0 (GLOVE)
GLOVE EUDERMIC 7 POWDERFREE (GLOVE) IMPLANT
GLOVE ORTHO TXT STRL SZ7.5 (GLOVE) IMPLANT
GLOVE SURG EUDERMIC 8 LTX PF (GLOVE) ×6 IMPLANT
GOWN STRL NON-REIN LRG LVL3 (GOWN DISPOSABLE) ×18 IMPLANT
HEMOSTAT POWDER SURGIFOAM 1G (HEMOSTASIS) ×9 IMPLANT
HEMOSTAT SURGICEL 2X14 (HEMOSTASIS) ×3 IMPLANT
INSERT FOGARTY 61MM (MISCELLANEOUS) IMPLANT
INSERT FOGARTY XLG (MISCELLANEOUS) IMPLANT
KIT BASIN OR (CUSTOM PROCEDURE TRAY) ×3 IMPLANT
KIT ROOM TURNOVER OR (KITS) ×3 IMPLANT
KIT SUCTION CATH 14FR (SUCTIONS) ×3 IMPLANT
KIT VASOVIEW W/TROCAR VH 2000 (KITS) ×3 IMPLANT
LEAD PACING MYOCARDI (MISCELLANEOUS) ×9 IMPLANT
LINE EXTENSION DELIVERY (MISCELLANEOUS) ×3 IMPLANT
MARKER GRAFT CORONARY BYPASS (MISCELLANEOUS) ×9 IMPLANT
NS IRRIG 1000ML POUR BTL (IV SOLUTION) ×18 IMPLANT
PACK OPEN HEART (CUSTOM PROCEDURE TRAY) ×3 IMPLANT
PAD ARMBOARD 7.5X6 YLW CONV (MISCELLANEOUS) ×6 IMPLANT
PENCIL BUTTON HOLSTER BLD 10FT (ELECTRODE) ×3 IMPLANT
PUNCH AORTIC ROTATE 4.0MM (MISCELLANEOUS) ×3 IMPLANT
PUNCH AORTIC ROTATE 4.5MM 8IN (MISCELLANEOUS) IMPLANT
PUNCH AORTIC ROTATE 5MM 8IN (MISCELLANEOUS) IMPLANT
SET CARDIOPLEGIA MPS 5001102 (MISCELLANEOUS) ×3 IMPLANT
SOLUTION ANTI FOG 6CC (MISCELLANEOUS) IMPLANT
SPONGE GAUZE 4X4 12PLY (GAUZE/BANDAGES/DRESSINGS) ×6 IMPLANT
SPONGE GAUZE 4X4 FOR O.R. (GAUZE/BANDAGES/DRESSINGS) ×9 IMPLANT
SPONGE INTESTINAL PEANUT (DISPOSABLE) IMPLANT
SPONGE LAP 18X18 X RAY DECT (DISPOSABLE) ×9 IMPLANT
SPONGE LAP 4X18 X RAY DECT (DISPOSABLE) IMPLANT
SUT BONE WAX W31G (SUTURE) ×3 IMPLANT
SUT MNCRL AB 4-0 PS2 18 (SUTURE) IMPLANT
SUT PROLENE 3 0 SH DA (SUTURE) IMPLANT
SUT PROLENE 3 0 SH1 36 (SUTURE) IMPLANT
SUT PROLENE 4 0 RB 1 (SUTURE) ×2
SUT PROLENE 4 0 SH DA (SUTURE) ×3 IMPLANT
SUT PROLENE 4-0 RB1 .5 CRCL 36 (SUTURE) ×1 IMPLANT
SUT PROLENE 5 0 C 1 36 (SUTURE) IMPLANT
SUT PROLENE 6 0 C 1 30 (SUTURE) IMPLANT
SUT PROLENE 6 0 CC (SUTURE) IMPLANT
SUT PROLENE 7 0 BV 1 (SUTURE) IMPLANT
SUT PROLENE 7 0 BV1 MDA (SUTURE) IMPLANT
SUT PROLENE 7 0 DA (SUTURE) IMPLANT
SUT PROLENE 7.0 RB 3 (SUTURE) ×9 IMPLANT
SUT PROLENE 8 0 BV175 6 (SUTURE) IMPLANT
SUT PROLENE BLUE 7 0 (SUTURE) ×6 IMPLANT
SUT PROLENE POLY MONO (SUTURE) IMPLANT
SUT SILK  1 MH (SUTURE)
SUT SILK 1 MH (SUTURE) IMPLANT
SUT SILK 2 0 SH CR/8 (SUTURE) IMPLANT
SUT SILK 3 0 SH CR/8 (SUTURE) ×3 IMPLANT
SUT STEEL 6MS V (SUTURE) ×6 IMPLANT
SUT STEEL STERNAL CCS#1 18IN (SUTURE) IMPLANT
SUT STEEL SZ 6 DBL 3X14 BALL (SUTURE) ×3 IMPLANT
SUT VIC AB 1 CTX 18 (SUTURE) IMPLANT
SUT VIC AB 1 CTX 36 (SUTURE) ×4
SUT VIC AB 1 CTX36XBRD ANBCTR (SUTURE) ×2 IMPLANT
SUT VIC AB 2-0 CT1 27 (SUTURE)
SUT VIC AB 2-0 CT1 TAPERPNT 27 (SUTURE) IMPLANT
SUT VIC AB 2-0 CTX 27 (SUTURE) IMPLANT
SUT VIC AB 3-0 SH 27 (SUTURE)
SUT VIC AB 3-0 SH 27X BRD (SUTURE) IMPLANT
SUT VIC AB 3-0 X1 27 (SUTURE) IMPLANT
SUT VICRYL 4-0 PS2 18IN ABS (SUTURE) IMPLANT
SUTURE E-PAK OPEN HEART (SUTURE) ×3 IMPLANT
SYSTEM SAHARA CHEST DRAIN ATS (WOUND CARE) ×3 IMPLANT
TOWEL OR 17X24 6PK STRL BLUE (TOWEL DISPOSABLE) ×6 IMPLANT
TOWEL OR 17X26 10 PK STRL BLUE (TOWEL DISPOSABLE) ×9 IMPLANT
TRAY FOLEY IC TEMP SENS 16FR (CATHETERS) ×3 IMPLANT
TUBING INSUFFLATION 10FT LAP (TUBING) ×3 IMPLANT
UNDERPAD 30X30 INCONTINENT (UNDERPADS AND DIAPERS) ×3 IMPLANT
WATER STERILE IRR 1000ML POUR (IV SOLUTION) ×6 IMPLANT

## 2011-12-04 NOTE — OR Nursing (Signed)
12:40pm called front desk to inform family off pump, 1st call made to SICU.  13:15pm - 2nd call made to SICU

## 2011-12-04 NOTE — Preoperative (Signed)
Beta Blockers   Reason not to administer Beta Blockers:Not Applicable 

## 2011-12-04 NOTE — Procedures (Signed)
Extubation Procedure Note  Patient Details:   Name: [name] DOB: 09/30/1941 MRN: [id number]   Airway Documentation: Patient extubated to 4 lpm nasal cannula.  VC 600 ml, NIF -30, able to hold head off bed 15 seconds.  Tolerated well, no complications.  Able to breath around deflated cuff, and able to vocalize post procedure.    Evaluation  O2 sats: stable throughout Complications: No apparent complications Patient did tolerate procedure well. Bilateral Breath Sounds: Clear;Diminished Suctioning: Airway Yes  Day, Elwyn Lade 12/04/2011, 8:44 PM

## 2011-12-04 NOTE — Brief Op Note (Signed)
12/01/2011 - 12/04/2011  1:51 PM  PATIENT:  [name]  71 y.o. female  PRE-OPERATIVE DIAGNOSIS:  CAD nonstemi  ACS  POST-OPERATIVE DIAGNOSIS:  CAD  PROCEDURE:  Procedure(s): CORONARY ARTERY BYPASS GRAFTING (CABG)x3  LIMA-LAD,SVG-OM, SVG-RCA  SURGEON:  Surgeon(s): Tharon Aquas Trigt III, MD  PHYSICIAN ASSISTANTRonnie Scott SA:   ASSISTANTS: none   ANESTHESIA:   General  EBL:  Total I/O In: [id number] [I.V.:3750; Blood:920] Out: [id number] [Urine:1500; Blood:1065]  BLOOD ADMINISTERED:2 units PRBC for starting HCT 27  DRAINS: MT, L PT  SPECIMEN:    DISPOSITION OF SPECIMEN:   COUNTS:    TOURNIQUET   PLAN OF CARE: Admit to inpatient   PATIENT DISPOSITION:  ICU - intubated and hemodynamically stable.   Delay start of Pharmacological VTE agent

## 2011-12-04 NOTE — Progress Notes (Signed)
S/P CABG Stable hemodynamics Ready to extubate

## 2011-12-04 NOTE — Op Note (Signed)
NAMEDAJANA, MERLOS               ACCOUNT NO.:  [id number]  MEDICAL RECORD NO.:  [id number]  LOCATION:  2302                         FACILITY:  [facility]  PHYSICIAN:  Ivin Poot, M.D.  DATE OF BIRTH:  1941-08-11  DATE OF PROCEDURE:  12/04/2011 DATE OF DISCHARGE:                              OPERATIVE REPORT   OPERATION: 1. Coronary artery bypass grafting x3 (left internal mammary artery to     left anterior descending, saphenous vein graft to obtuse marginal,     saphenous vein graft to right coronary artery). 2. Endoscopic harvest of right leg greater saphenous vein.  PREOPERATIVE DIAGNOSES: 1. Severe multivessel coronary artery disease with non-ST elevation     myocardial infarction, unstable angina. 2. Deeply intramyocardial left anterior descending.  POSTOPERATIVE DIAGNOSES: 1. Severe multivessel coronary artery disease with non-ST elevation     myocardial infarction, unstable angina. 2. Deeply intramyocardial left anterior descending.  SURGEON:  Ivin Poot, MD  ASSISTANT:  Talbert Forest, SA  ANESTHESIA:  General.  INDICATIONS:  The patient is a 71 year old female who presented with unstable angina and positive cardiac enzymes.  A 2-D echo showed anterior apical hypokinesia and subsequent cardiac catheterization by Dr. Ellyn Hack demonstrated 99% LAD stenosis and moderate stenosis of the circumflex and right coronary.  EF was mild to moderately reduced.  She was felt to be a candidate for surgical revascularization and was placed on IV heparin and nitroglycerin.  She was not given Plavix.  She was felt to be candidate for surgical revascularization. Prior to surgery, I examined the patient in her CCU room and reviewed results of the cardiac cath with the patient and family.  I discussed the indications and expected benefits of coronary artery bypass grafting for treatment of her coronary artery disease.  I reviewed the major aspects of surgery including the use of  general anesthesia, cardiopulmonary bypass, the location of the surgical incisions, and the expected postoperative hospital recovery.  I discussed with the patient the risks to her coronary artery bypass surgery, including risks of MI, stroke, bleeding, infection, and death.  She understood that with a starting hematocrit of 28% that she would probably need blood products for the surgery.  She demonstrated her understanding of all these aspects and agreed with surgery under what I felt was an informed consent.  OPERATIVE FINDINGS: 1. Adequate conduit. 2. Difficult heart exposure due to her short and obese body habitus     and enlarged heart from hypertension. 3. Deeply intramyocardial LAD. 4. Intraoperative anemia requiring 2 units of packed cell transfusion.  PROCEDURE:  The patient was brought to the operating room and placed supine on the operating room table where general anesthesia was induced under invasive hemodynamic monitoring.  The chest, abdomen, and legs were prepped with Betadine and draped as a sterile field.  A sternal incision was made as the saphenous vein was harvested endoscopically from the right leg.  The left internal mammary artery was harvested as a pedicle graft from its origin at the subclavian vessels.  There was a good vessel with excellent flow.  The sternal retractor was placed using the deep blades due to the patient's obese body habitus.  Pericardium was opened and suspended.  Heparin was administered.  Pursestrings were placed in the ascending aorta and right atrium, and the patient was then cannulated, and placed on cardiopulmonary bypass.  The right coronary and circumflex were identified for grafting,  however, the LAD was not identifiable due to its deep intramyocardial location and was dissected out later after the heart had been arrested.  Cardioplegia cannula was placed and the patient was cooled to 32 degrees.  The aortic cross-clamp was  applied.  800 mL of cold blood cardioplegia was delivered in split doses between the antegrade aortic and retrograde coronary sinus catheters.  There was good  cardioplegic arrest, and septal temperature dropped less than 10 degrees. Cardioplegia was delivered every 20 minutes while the cross-clamp was in place.  The distal coronary anastomoses were then performed.  The first distal anastomosis was to the right coronary.  This was a 1.5-mm vessel within the proximal 50% stenosis.  A reverse saphenous vein was sewn end-to- side with running 7-0 Prolene with good flow through the graft.  A second distal anastomosis was to the obtuse marginal.  This was 1.5 mm vessel, proximal 70% stenosis.  A reverse saphenous vein was sewn end-to- side with running 7-0 Prolene with good flow through the graft. Cardioplegia was redosed.  The third distal anastomosis was to the distal third of the LAD.  The LAD was deeply intramyocardial as well as under a deep layer of epicardial fat.  When it was identified, however, it was a good 1.7 mm vessel with a proximal 99% stenosis.  The internal mammary artery was sewn end-to-side with running 8-0 Prolene and was carefully tacked down to the myocardial fat to avoid tension on the anastomosis.  The bulldog on the mammary pedicle was briefly released and there appeared to be good flow.  Cardioplegia was redosed.  While the crossclamp was still in place, 2 proximal vein anastomoses were performed on the ascending aorta using a 4.0 mm punch running 7-0 Prolene.  Air was vented from the coronaries with a dose of retrograde warm blood cardioplegia, and the crossclamp was removed.  The heart resumed a spontaneous rhythm.  The grafts were checked and found to be hemostatic with a good flow.  Temporary pacing wires were applied.  The lungs re-expanded.  The patient was rewarmed.  The patient was weaned off bypass on low-dose dopamine without difficulty.  Blood pressure  and cardiac output were normal.  Protamine was administered without adverse reaction.  The cannula was removed.  The superior pericardial fat was closed.  An anterior mediastinal, and a left pleural chest tube were placed and brought out through separate incisions.  The sternum was closed with interrupted steel wire.  The pectoralis fascia was closed with a running #1 Vicryl.  The subcutaneous and skin layers were closed with running Vicryl and sterile dressings were applied. Total cardiopulmonary bypass time was 140 minutes.     Ivin Poot, M.D.     PV/MEDQ  D:  12/04/2011  T:  12/04/2011  Job:  [id number]  cc:   Rolland Porter, MD

## 2011-12-04 NOTE — Transfer of Care (Addendum)
Immediate Anesthesia Transfer of Care Note  Patient: [name]  Procedure(s) Performed:  CORONARY ARTERY BYPASS GRAFTING (CABG) - CABG x three,  using left internal mammary artery, and right leg greater saphenous vein harvested endoscopically  Patient Location: SICU  Anesthesia Type: General  Level of Consciousness: sedated and unresponsive  Airway & Oxygen Therapy: Patient placed on Ventilator (see vital sign flow sheet for setting)  Post-op Assessment: Report given to PACU RN and Post -op Vital signs reviewed and stable  Post vital signs: Reviewed and stable  Complications: No apparent anesthesia complications

## 2011-12-04 NOTE — Progress Notes (Signed)
The patient was examined and preop studies reviewed. There has been no change from the prior exam and the patient is ready for surgery.  Plan multivessel CABG today. Procedure d/w patient and daughter.

## 2011-12-04 NOTE — Anesthesia Preprocedure Evaluation (Addendum)
Anesthesia Evaluation  Patient identified by MRN, date of birth, ID band Patient awake    Reviewed: Allergy & Precautions, H&P , NPO status , Patient's Chart, lab work & pertinent test results  History of Anesthesia Complications (+) PONV  Airway Mallampati: III TM Distance: >3 FB Neck ROM: Full  Mouth opening: Limited Mouth Opening  Dental  (+) Edentulous Upper, Dental Advisory Given and Caps,    Pulmonary pneumonia ,    Pulmonary exam normal       Cardiovascular hypertension, Pt. on medications + angina with exertion + Past MI Regular Normal    Neuro/Psych PSYCHIATRIC DISORDERS Anxiety Depression  Neuromuscular disease    GI/Hepatic GERD-  Controlled and Medicated,  Endo/Other  Diabetes mellitus-Hypothyroidism   Renal/GU      Musculoskeletal   Abdominal   Peds  Hematology   Anesthesia Other Findings   Reproductive/Obstetrics                          Anesthesia Physical Anesthesia Plan  ASA: III  Anesthesia Plan: General   Post-op Pain Management:    Induction: Intravenous  Airway Management Planned: Oral ETT  Additional Equipment: Arterial line, TEE, PA Cath, Ultrasound Guidance Line Placement and CVP  Intra-op Plan:   Post-operative Plan: Post-operative intubation/ventilation  Informed Consent: I have reviewed the patients History and Physical, chart, labs and discussed the procedure including the risks, benefits and alternatives for the proposed anesthesia with the patient or authorized representative who has indicated his/her understanding and acceptance.   Dental advisory given  Plan Discussed with: CRNA  Anesthesia Plan Comments:        Anesthesia Quick Evaluation

## 2011-12-04 NOTE — Anesthesia Procedure Notes (Signed)
Procedure Name: Intubation Date/Time: 12/04/2011 8:07 AM Performed by: Wanita Chamberlain Pre-anesthesia Checklist: Patient identified, Emergency Drugs available, Suction available and Patient being monitored Patient Re-evaluated:Patient Re-evaluated prior to inductionOxygen Delivery Method: Circle System Utilized Preoxygenation: Pre-oxygenation with 100% oxygen Intubation Type: IV induction Ventilation: Mask ventilation without difficulty Laryngoscope Size: Mac and 3 Tube size: 8.0 mm Number of attempts: 1 Airway Equipment and Method: stylet Placement Confirmation: ETT inserted through vocal cords under direct vision,  positive ETCO2 and CO2 detector Secured at: 20 cm Tube secured with: Tape Dental Injury: Teeth and Oropharynx as per pre-operative assessment

## 2011-12-05 ENCOUNTER — Inpatient Hospital Stay (HOSPITAL_COMMUNITY): Payer: Medicare Other

## 2011-12-05 ENCOUNTER — Other Ambulatory Visit: Payer: Self-pay

## 2011-12-05 DIAGNOSIS — E1165 Type 2 diabetes mellitus with hyperglycemia: Secondary | ICD-10-CM

## 2011-12-05 DIAGNOSIS — IMO0001 Reserved for inherently not codable concepts without codable children: Secondary | ICD-10-CM

## 2011-12-05 LAB — PREPARE FRESH FROZEN PLASMA: Unit division: 0

## 2011-12-05 LAB — CBC
HCT: 25.9 % — ABNORMAL LOW (ref 36.0–46.0)
HCT: 27.9 % — ABNORMAL LOW (ref 36.0–46.0)
Hemoglobin: 9 g/dL — ABNORMAL LOW (ref 12.0–15.0)
Hemoglobin: 9.6 g/dL — ABNORMAL LOW (ref 12.0–15.0)
MCH: 31.7 pg (ref 26.0–34.0)
MCH: 32 pg (ref 26.0–34.0)
MCHC: 34.7 g/dL (ref 30.0–36.0)
MCHC: 34.4 g/dL (ref 30.0–36.0)
MCV: 91.2 fL (ref 78.0–100.0)
MCV: 93 fL (ref 78.0–100.0)
Platelets: 97 10*3/uL — ABNORMAL LOW (ref 150–400)
Platelets: 102 10*3/uL — ABNORMAL LOW (ref 150–400)
RBC: 2.84 MIL/uL — ABNORMAL LOW (ref 3.87–5.11)
RBC: 3 MIL/uL — ABNORMAL LOW (ref 3.87–5.11)
RDW: 13.7 % (ref 11.5–15.5)
RDW: 14.3 % (ref 11.5–15.5)
WBC: 9.8 10*3/uL (ref 4.0–10.5)
WBC: 10.3 10*3/uL (ref 4.0–10.5)

## 2011-12-05 LAB — GLUCOSE, CAPILLARY
Glucose-Capillary: 148 mg/dL — ABNORMAL HIGH (ref 70–99)
Glucose-Capillary: 187 mg/dL — ABNORMAL HIGH (ref 70–99)
Glucose-Capillary: 171 mg/dL — ABNORMAL HIGH (ref 70–99)
Glucose-Capillary: 159 mg/dL — ABNORMAL HIGH (ref 70–99)
Glucose-Capillary: 193 mg/dL — ABNORMAL HIGH (ref 70–99)
Glucose-Capillary: 208 mg/dL — ABNORMAL HIGH (ref 70–99)
Glucose-Capillary: 200 mg/dL — ABNORMAL HIGH (ref 70–99)
Glucose-Capillary: 171 mg/dL — ABNORMAL HIGH (ref 70–99)
Glucose-Capillary: 183 mg/dL — ABNORMAL HIGH (ref 70–99)
Glucose-Capillary: 117 mg/dL — ABNORMAL HIGH (ref 70–99)
Glucose-Capillary: 93 mg/dL (ref 70–99)
Glucose-Capillary: 86 mg/dL (ref 70–99)
Glucose-Capillary: 71 mg/dL (ref 70–99)

## 2011-12-05 LAB — POCT I-STAT 3, ART BLOOD GAS (G3+)
Acid-base deficit: 4 mmol/L — ABNORMAL HIGH (ref 0.0–2.0)
Bicarbonate: 22 mEq/L (ref 20.0–24.0)
O2 Saturation: 90 %
Patient temperature: 37
TCO2: 23 mmol/L (ref 0–100)
pCO2 arterial: 41.3 mmHg (ref 35.0–45.0)
pH, Arterial: 7.335 — ABNORMAL LOW (ref 7.350–7.400)
pO2, Arterial: 63 mmHg — ABNORMAL LOW (ref 80.0–100.0)

## 2011-12-05 LAB — CREATININE, SERUM
Creatinine, Ser: 1.24 mg/dL — ABNORMAL HIGH (ref 0.50–1.10)
GFR calc Af Amer: 49 mL/min — ABNORMAL LOW (ref >90)
GFR calc non Af Amer: 43 mL/min — ABNORMAL LOW (ref >90)

## 2011-12-05 LAB — POCT I-STAT, CHEM 8
BUN: 14 mg/dL (ref 6–23)
Calcium, Ion: 1.12 mmol/L (ref 1.12–1.32)
Chloride: 104 mEq/L (ref 96–112)
Creatinine, Ser: 1.3 mg/dL — ABNORMAL HIGH (ref 0.50–1.10)
Glucose, Bld: 212 mg/dL — ABNORMAL HIGH (ref 70–99)
HCT: 28 % — ABNORMAL LOW (ref 36.0–46.0)
Hemoglobin: 9.5 g/dL — ABNORMAL LOW (ref 12.0–15.0)
Potassium: 4 mEq/L (ref 3.5–5.1)
Sodium: 141 mEq/L (ref 135–145)
TCO2: 22 mmol/L (ref 0–100)

## 2011-12-05 LAB — BASIC METABOLIC PANEL
BUN: 11 mg/dL (ref 6–23)
CO2: 23 mEq/L (ref 19–32)
Calcium: 7.8 mg/dL — ABNORMAL LOW (ref 8.4–10.5)
Chloride: 108 mEq/L (ref 96–112)
Creatinine, Ser: 0.89 mg/dL (ref 0.50–1.10)
GFR calc Af Amer: 74 mL/min — ABNORMAL LOW (ref >90)
GFR calc non Af Amer: 64 mL/min — ABNORMAL LOW (ref >90)
Glucose, Bld: 179 mg/dL — ABNORMAL HIGH (ref 70–99)
Potassium: 4.5 mEq/L (ref 3.5–5.1)
Sodium: 139 mEq/L (ref 135–145)

## 2011-12-05 LAB — PREPARE PLATELET PHERESIS: Unit division: 0

## 2011-12-05 LAB — MAGNESIUM
Magnesium: 2.6 mg/dL — ABNORMAL HIGH (ref 1.5–2.5)
Magnesium: 2.4 mg/dL (ref 1.5–2.5)

## 2011-12-05 MED ORDER — INSULIN GLARGINE 100 UNIT/ML ~~LOC~~ SOLN
14.0000 [IU] | SUBCUTANEOUS | Status: DC
  Administered 2011-12-05 – 2011-12-08 (×4): 14 [IU] via SUBCUTANEOUS

## 2011-12-05 MED ORDER — METOCLOPRAMIDE HCL 5 MG/ML IJ SOLN
10.0000 mg | Freq: Four times a day (QID) | INTRAMUSCULAR | Status: AC
  Administered 2011-12-05 – 2011-12-06 (×4): 10 mg via INTRAVENOUS
  Filled 2011-12-05 (×4): qty 2

## 2011-12-05 MED ORDER — PROMETHAZINE HCL 25 MG/ML IJ SOLN
12.5000 mg | Freq: Four times a day (QID) | INTRAMUSCULAR | Status: DC | PRN
  Administered 2011-12-05: 12.5 mg via INTRAVENOUS
  Filled 2011-12-05: qty 1

## 2011-12-05 MED ORDER — INSULIN ASPART 100 UNIT/ML ~~LOC~~ SOLN: 0.0000 [IU] | SUBCUTANEOUS | Status: DC

## 2011-12-05 MED ORDER — FUROSEMIDE 10 MG/ML IJ SOLN
40.0000 mg | Freq: Once | INTRAMUSCULAR | Status: AC
  Administered 2011-12-05: 40 mg via INTRAVENOUS

## 2011-12-05 MED ORDER — DOPAMINE-DEXTROSE 3.2-5 MG/ML-% IV SOLN
INTRAVENOUS | Status: AC
  Filled 2011-12-05: qty 250

## 2011-12-05 MED ORDER — TRAMADOL HCL 50 MG PO TABS
50.0000 mg | ORAL_TABLET | Freq: Four times a day (QID) | ORAL | Status: DC | PRN
  Administered 2011-12-06: 50 mg via ORAL
  Filled 2011-12-05 (×2): qty 1

## 2011-12-05 MED ORDER — INSULIN ASPART 100 UNIT/ML ~~LOC~~ SOLN
0.0000 [IU] | SUBCUTANEOUS | Status: DC
  Administered 2011-12-06 (×3): 2 [IU] via SUBCUTANEOUS
  Administered 2011-12-06: 4 [IU] via SUBCUTANEOUS

## 2011-12-05 NOTE — Progress Notes (Signed)
Dr. Darcey Nora in, pacer off, pt sleepy and burping air, sats down PCXray done, O2 increased to 6 L

## 2011-12-05 NOTE — Progress Notes (Signed)
Postop day 1 CABG x3 preoperative non-stemi MI  The patient is in a junctional rhythm. She is very weak and has been out of bed to chair x1. Chest x-ray is clear and she is still on nasal cannula. Neurologic function is intact. She is receiving Lasix diuresis.  Plan  keep patient in ICU until she is ambulatory with assistance in the hallway Continue diuresis Continue Lantus plus sliding-scale. She required an insulin drip to be resumed today.

## 2011-12-05 NOTE — Progress Notes (Signed)
Dr. Darcey Nora in and aware of problems with pacing, to resume dopamine at 2.5 mcg/kg/min

## 2011-12-05 NOTE — Progress Notes (Signed)
[facility] Progress Note  Subjective:  Day 1 s/p CABG x3  Objective:   Vital Signs in the last 24 hours: Temp:  [95.7 F (35.4 C)-98.8 F (37.1 C)] 98.6 F (37 C) (02/10 0830) Pulse Rate:  [68-92] 71  (02/10 1000) Resp:  [12-23] 15  (02/10 1000) BP: (86-145)/(44-79) 145/67 mmHg (02/10 1000) SpO2:  [93 %-100 %] 100 % (02/10 1000) Arterial Line BP: (87-173)/(47-76) 166/76 mmHg (02/10 0900) FiO2 (%):  [40 %-51.7 %] 40.7 % (02/09 2030) Weight:  [71.5 kg (157 lb 10.1 oz)] 71.5 kg (157 lb 10.1 oz) (02/10 0500)  Intake/Output from previous day: 02/09 0701 - 02/10 0700 In: 7583.3 [I.V.:5513.3; Blood:920; IV Piggyback:1150] Out: [id number] [Urine:3785; Blood:1065; Chest Tube:330]  Scheduled:   . acetaminophen (TYLENOL) oral liquid 160 mg/5 mL  650 mg Per Tube NOW   Or  . acetaminophen  650 mg Rectal NOW  . acetaminophen  1,000 mg Oral Q6H   Or  . acetaminophen (TYLENOL) oral liquid 160 mg/5 mL  975 mg Per Tube Q6H  . aspirin EC  325 mg Oral Daily   Or  . aspirin  324 mg Per Tube Daily  . bisacodyl  10 mg Oral Daily   Or  . bisacodyl  10 mg Rectal Daily  . cefUROXime (ZINACEF)  IV  1.5 g Intravenous To OR  . cefUROXime (ZINACEF)  IV  1.5 g Intravenous Q12H  . docusate sodium  200 mg Oral Daily  . famotidine (PEPCID) IV  20 mg Intravenous Q12H  . furosemide  20 mg Intravenous Once  . gabapentin  300 mg Oral TID  . glimepiride  4 mg Oral BID  . insulin aspart  0-24 Units Subcutaneous Q4H  . insulin aspart  0-24 Units Subcutaneous Q4H  . insulin glargine  14 Units Subcutaneous BH-q7a  . insulin regular  0-10 Units Intravenous TID WC  . levothyroxine  50 mcg Oral Custom  . magnesium sulfate  4 g Intravenous Once  . metoprolol tartrate  12.5 mg Oral BID   Or  . metoprolol tartrate  12.5 mg Per Tube BID  . pantoprazole  40 mg Oral Q1200  . phenylephrine (NEO-SYNEPHRINE) Adult infusion  30-200 mcg/min Intravenous Once  . potassium chloride  10 mEq  Intravenous Q1 Hr x 3  . rosuvastatin  20 mg Oral Daily  . sodium chloride  3 mL Intravenous Q12H  . vancomycin (VANCOCIN) IVPB 1000 mg/100 mL central line  1,000 mg Intravenous Once  . DISCONTD: aminocaproic acid (AMICAR) for OHS   Intravenous To OR  . DISCONTD: aspirin EC  81 mg Oral Daily  . DISCONTD: bisacodyl  5 mg Oral Once  . DISCONTD: cefUROXime (ZINACEF)  IV  750 mg Intravenous To OR  . DISCONTD: chlorhexidine  60 mL Topical Once  . DISCONTD: chlorhexidine  60 mL Topical Once  . DISCONTD: DOPamine  2-20 mcg/kg/min Intravenous To OR  . DISCONTD: epinephrine  0.5-20 mcg/min Intravenous To OR  . DISCONTD: heart attack bouncing book   Does not apply Once  . DISCONTD: insulin aspart  0-5 Units Subcutaneous QHS  . DISCONTD: insulin aspart  0-9 Units Subcutaneous TID WC  . DISCONTD: insulin glargine  12 Units Subcutaneous BID  . DISCONTD: insulin (NOVOLIN-R) infusion   Intravenous To OR  . DISCONTD: lisinopril  20 mg Oral Daily  . DISCONTD: magnesium sulfate  40 mEq Other To OR  . DISCONTD: metoprolol tartrate  25 mg Oral BID  . DISCONTD:  mulitivitamin with minerals  1 tablet Oral Daily  . DISCONTD: nitroGLYCERIN  2-200 mcg/min Intravenous To OR  . DISCONTD: pantoprazole  40 mg Oral Daily  . DISCONTD: phenylephrine (NEO-SYNEPHRINE) Adult infusion  30-200 mcg/min Intravenous To OR  . DISCONTD: potassium chloride  80 mEq Other To OR  . DISCONTD: sodium chloride  3 mL Intravenous Q12H  . DISCONTD: vitamin C  500 mg Oral Daily    Physical Exam:   General appearance: alert, cooperative and no distress Neck: no adenopathy, no carotid bruit, no JVD and supple, symmetrical, trachea midline Lungs: no wheezing, decreased bs at bases Heart: S1, S2 normal and no rub Abdomen: soft, non-tender; bowel sounds normal; no masses,  no organomegaly Extremities: extremities normal, atraumatic, no cyanosis or edema and trace edema Pulses: 2+ and symmetric   Rate: 80  Rhythm: AV paced   Lab  Results:    Basename 12/05/11 0425 12/04/11 2306 12/04/11 2022 12/04/11 0430  NA 139 141 -- --  K 4.5 4.3 -- --  CL 108 -- 110 --  CO2 23 -- -- 22  GLUCOSE 179* 201* -- --  BUN 11 -- 9 --  CREATININE 0.89 -- 0.90 --   No results found for this basename: TROPONINI:2,CK,MB:2 in the last 72 hours Hepatic Function Panel No results found for this basename: PROT,ALBUMIN,AST,ALT,ALKPHOS,BILITOT,BILIDIR,IBILI in the last 72 hours  Basename 12/04/11 1410  INR 1.34    Lipid Panel     Component Value Date/Time   CHOL 254* 12/02/2011 0655   TRIG 375* 12/02/2011 0655   HDL 36* 12/02/2011 0655   CHOLHDL 7.1 12/02/2011 0655   VLDL 75* 12/02/2011 0655   LDLCALC 143* 12/02/2011 0655     Imaging:  Dg Chest Portable 1 View In Am  12/05/2011  *RADIOLOGY REPORT*  Clinical Data: Postop CABG  PORTABLE CHEST - 1 VIEW  Comparison: 12/04/2011  Findings: Interval extubation and removal of enteric tube.  Stable left apical chest tube and mediastinal drain.  No pneumothorax is seen.  Stable right IJ Swan-Ganz catheter its tip in the main pulmonary artery.  Suspected small bilateral pleural effusions.  Lungs otherwise clear.  Heart is top normal in size. Postsurgical changes related to prior CABG.  IMPRESSION: Interval extubation.  Stable left apical chest tube and mediastinal drain.  No pneumothorax is seen.  Original Report Authenticated By: Julian Hy, M.D.   Dg Chest Portable 1 View  12/04/2011  *RADIOLOGY REPORT*  Clinical Data: Postop heart surgery  PORTABLE CHEST - 1 VIEW  Comparison: 12/03/2011  Findings: Chronic interstitial markings.  Bibasilar atelectasis. No pleural effusion or pneumothorax.  Left apical chest tube and mediastinal drain.  Endotracheal tube terminating 5 cm above the carina.  Right IJ Swan-Ganz catheter with its tip in the right main pulmonary artery.  The heart is normal in size. Postsurgical changes related to prior CABG.  Enteric tube terminating in the gastric body.  IMPRESSION: No  pneumothorax.  Support apparatus as above.  Original Report Authenticated By: Julian Hy, M.D.      Assessment/Plan:   Principal Problem:  *NSTEMI (non-ST elevated myocardial infarction) Active Problems:  GERD  DIABETES MELLITUS-TYPE II  HTN (hypertension)  Family history of early CAD  Hyperlipidemia LDL goal <70   Day 1 S/P CABG x3 (LIMA to LAD, SVG -OM, SVG-RCA).  Continue diuresis.  Add ACE or ARB as BP allows.   Troy Sine, MD, [facility] 12/05/2011, 10:07 AM

## 2011-12-05 NOTE — Progress Notes (Signed)
Pt will not capture/sense appropriately any mode of pacing despite troubleshooting MA, changing pacer, etc. Every time pt moves at all, pacer does not pace, pt in SR/SB with BP 120/65, will leave pacer off but attached and continue to assess, another RN assessed as well

## 2011-12-06 ENCOUNTER — Inpatient Hospital Stay (HOSPITAL_COMMUNITY): Payer: Medicare Other

## 2011-12-06 ENCOUNTER — Encounter (HOSPITAL_COMMUNITY): Payer: Self-pay | Admitting: Cardiothoracic Surgery

## 2011-12-06 DIAGNOSIS — E1165 Type 2 diabetes mellitus with hyperglycemia: Secondary | ICD-10-CM

## 2011-12-06 DIAGNOSIS — IMO0001 Reserved for inherently not codable concepts without codable children: Secondary | ICD-10-CM

## 2011-12-06 LAB — GLUCOSE, CAPILLARY
Glucose-Capillary: 100 mg/dL — ABNORMAL HIGH (ref 70–99)
Glucose-Capillary: 123 mg/dL — ABNORMAL HIGH (ref 70–99)
Glucose-Capillary: 129 mg/dL — ABNORMAL HIGH (ref 70–99)
Glucose-Capillary: 152 mg/dL — ABNORMAL HIGH (ref 70–99)
Glucose-Capillary: 160 mg/dL — ABNORMAL HIGH (ref 70–99)
Glucose-Capillary: 163 mg/dL — ABNORMAL HIGH (ref 70–99)
Glucose-Capillary: 169 mg/dL — ABNORMAL HIGH (ref 70–99)

## 2011-12-06 LAB — BASIC METABOLIC PANEL
BUN: 17 mg/dL (ref 6–23)
CO2: 24 mEq/L (ref 19–32)
Calcium: 8.2 mg/dL — ABNORMAL LOW (ref 8.4–10.5)
Chloride: 104 mEq/L (ref 96–112)
Creatinine, Ser: 1.31 mg/dL — ABNORMAL HIGH (ref 0.50–1.10)
GFR calc Af Amer: 46 mL/min — ABNORMAL LOW (ref >90)
GFR calc non Af Amer: 40 mL/min — ABNORMAL LOW (ref >90)
Glucose, Bld: 135 mg/dL — ABNORMAL HIGH (ref 70–99)
Potassium: 3.9 mEq/L (ref 3.5–5.1)
Sodium: 136 mEq/L (ref 135–145)

## 2011-12-06 LAB — CBC
HCT: 26.7 % — ABNORMAL LOW (ref 36.0–46.0)
Hemoglobin: 9.3 g/dL — ABNORMAL LOW (ref 12.0–15.0)
MCH: 32.6 pg (ref 26.0–34.0)
MCHC: 34.8 g/dL (ref 30.0–36.0)
MCV: 93.7 fL (ref 78.0–100.0)
Platelets: 120 10*3/uL — ABNORMAL LOW (ref 150–400)
RBC: 2.85 MIL/uL — ABNORMAL LOW (ref 3.87–5.11)
RDW: 14.3 % (ref 11.5–15.5)
WBC: 10.5 10*3/uL (ref 4.0–10.5)

## 2011-12-06 MED ORDER — SODIUM CHLORIDE 0.9 % IJ SOLN: 3.0000 mL | INTRAMUSCULAR | Status: DC | PRN

## 2011-12-06 MED ORDER — SODIUM CHLORIDE 0.9 % IJ SOLN
3.0000 mL | Freq: Two times a day (BID) | INTRAMUSCULAR | Status: DC
  Administered 2011-12-06 – 2011-12-10 (×7): 3 mL via INTRAVENOUS

## 2011-12-06 MED ORDER — FUROSEMIDE 10 MG/ML IJ SOLN
40.0000 mg | Freq: Once | INTRAMUSCULAR | Status: AC
  Administered 2011-12-06: 40 mg via INTRAVENOUS
  Filled 2011-12-06: qty 4

## 2011-12-06 MED ORDER — SODIUM CHLORIDE 0.9 % IV SOLN: 250.0000 mL | INTRAVENOUS | Status: DC | PRN

## 2011-12-06 MED ORDER — BISACODYL 10 MG RE SUPP: 10.0000 mg | Freq: Every day | RECTAL | Status: DC | PRN

## 2011-12-06 MED ORDER — PANTOPRAZOLE SODIUM 40 MG PO TBEC
40.0000 mg | DELAYED_RELEASE_TABLET | Freq: Every day | ORAL | Status: DC
  Administered 2011-12-07 – 2011-12-10 (×4): 40 mg via ORAL
  Filled 2011-12-06 (×4): qty 1

## 2011-12-06 MED ORDER — BISACODYL 5 MG PO TBEC
10.0000 mg | DELAYED_RELEASE_TABLET | Freq: Every day | ORAL | Status: DC | PRN
  Filled 2011-12-06: qty 2

## 2011-12-06 MED ORDER — ROSUVASTATIN CALCIUM 10 MG PO TABS
10.0000 mg | ORAL_TABLET | Freq: Every day | ORAL | Status: DC
  Administered 2011-12-07 – 2011-12-09 (×3): 10 mg via ORAL
  Filled 2011-12-06 (×4): qty 1

## 2011-12-06 MED ORDER — POTASSIUM CHLORIDE CRYS ER 20 MEQ PO TBCR
20.0000 meq | EXTENDED_RELEASE_TABLET | Freq: Every day | ORAL | Status: DC
  Administered 2011-12-06 – 2011-12-08 (×3): 20 meq via ORAL
  Filled 2011-12-06 (×4): qty 1

## 2011-12-06 MED ORDER — LISINOPRIL 2.5 MG PO TABS
2.5000 mg | ORAL_TABLET | Freq: Every day | ORAL | Status: DC
  Administered 2011-12-07 – 2011-12-09 (×3): 2.5 mg via ORAL
  Filled 2011-12-06 (×4): qty 1

## 2011-12-06 MED ORDER — INSULIN ASPART 100 UNIT/ML ~~LOC~~ SOLN
0.0000 [IU] | Freq: Three times a day (TID) | SUBCUTANEOUS | Status: DC
  Administered 2011-12-06 – 2011-12-07 (×2): 2 [IU] via SUBCUTANEOUS
  Administered 2011-12-07 (×2): 8 [IU] via SUBCUTANEOUS
  Administered 2011-12-08: 12 [IU] via SUBCUTANEOUS
  Administered 2011-12-08: 2 [IU] via SUBCUTANEOUS
  Administered 2011-12-09 (×2): 8 [IU] via SUBCUTANEOUS
  Administered 2011-12-10: 2 [IU] via SUBCUTANEOUS
  Filled 2011-12-06: qty 3

## 2011-12-06 MED ORDER — MOVING RIGHT ALONG BOOK
Freq: Once | Status: AC
  Administered 2011-12-06: 20:00:00
  Filled 2011-12-06: qty 1

## 2011-12-06 MED ORDER — FUROSEMIDE 40 MG PO TABS
40.0000 mg | ORAL_TABLET | Freq: Every day | ORAL | Status: DC
  Administered 2011-12-06 – 2011-12-09 (×4): 40 mg via ORAL
  Filled 2011-12-06 (×5): qty 1

## 2011-12-06 MED FILL — Nitroglycerin IV Soln 5 MG/ML: INTRAVENOUS | Qty: 10 | Status: AC

## 2011-12-06 MED FILL — Heparin Sodium (Porcine) Inj 1000 Unit/ML: INTRAMUSCULAR | Qty: 10 | Status: AC

## 2011-12-06 MED FILL — Magnesium Sulfate Inj 50%: INTRAMUSCULAR | Qty: 10 | Status: AC

## 2011-12-06 MED FILL — Potassium Chloride Inj 2 mEq/ML: INTRAVENOUS | Qty: 40 | Status: AC

## 2011-12-06 MED FILL — Verapamil HCl IV Soln 2.5 MG/ML: INTRAVENOUS | Qty: 4 | Status: AC

## 2011-12-06 MED FILL — Lactated Ringer's Solution: INTRAVENOUS | Qty: 500 | Status: AC

## 2011-12-06 MED FILL — Magnesium Sulfate Inj 50%: INTRAMUSCULAR | Qty: 4 | Status: AC

## 2011-12-06 NOTE — Progress Notes (Signed)
2 Days Post-Op Procedure(s) (LRB): CORONARY ARTERY BYPASS GRAFTING (CABG) (N/A) Subjective:POD 2 cabg NON STEM1                                  diabetes mellitus Postop nausea- improved Objective: Vital signs in last 24 hours: Temp:  [97.5 F (36.4 C)-99.9 F (37.7 C)] 98 F (36.7 C) (02/11 0734) Pulse Rate:  [52-75] 75  (02/11 0930) Cardiac Rhythm:  [-] Junctional rhythm (02/11 0745) Resp:  [15-21] 18  (02/11 0930) BP: (103-163)/(43-93) 108/64 mmHg (02/11 0900) SpO2:  [90 %-100 %] 97 % (02/11 0930)  Hemodynamic parameters for last 24 hours:  nsr  Intake/Output from previous day: 02/10 0701 - 02/11 0700 In: 999.7 [P.O.:240; I.V.:603.7; IV Piggyback:156] Out: [id number] [Urine:1055; Chest Tube:50] Intake/Output this shift: Total I/O In: 103 [P.O.:60; I.V.:43] Out: 75 [Urine:75]    Lab Results:  Basename 12/06/11 0330 12/05/11 1640  WBC 10.5 10.3  HGB 9.3* 9.6*  HCT 26.7* 27.9*  PLT 120* 102*   BMET:  Basename 12/06/11 0330 12/05/11 1640 12/05/11 1639 12/05/11 0425  NA 136 -- 141 --  K 3.9 -- 4.0 --  CL 104 -- 104 --  CO2 24 -- -- 23  GLUCOSE 135* -- 212* --  BUN 17 -- 14 --  CREATININE 1.31* 1.24* -- --  CALCIUM 8.2* -- -- 7.8*    PT/INR:  Basename 12/04/11 1410  LABPROT 16.8*  INR 1.34   ABG    Component Value Date/Time   PHART 7.335* 12/05/2011 0424   HCO3 22.0 12/05/2011 0424   TCO2 22 12/05/2011 1639   ACIDBASEDEF 4.0* 12/05/2011 0424   O2SAT 90.0 12/05/2011 0424   CBG (last 3)   Basename 12/06/11 0732 12/06/11 0311 12/06/11 0049  GLUCAP 123* 129* 100*    Assessment/Plan: S/P Procedure(s) (LRB): CORONARY ARTERY BYPASS GRAFTING (CABG) (N/A) Plan for transfer to step-down: see transfer orders   LOS: 5 days    VAN TRIGT III,PETER 12/06/2011

## 2011-12-06 NOTE — Progress Notes (Signed)
[facility] BRIEF SICU PROGRESS NOTE  2 Days Post-Op  S/P Procedure(s) (LRB): CORONARY ARTERY BYPASS GRAFTING (CABG) (N/A)   Stable day   Plan: Continue current plan  [name],[name] 12/06/2011 8:55 PM

## 2011-12-06 NOTE — Anesthesia Postprocedure Evaluation (Signed)
  Anesthesia Post-op Note  Patient: [name]  Procedure(s) Performed:  CORONARY ARTERY BYPASS GRAFTING (CABG) - CABG x three,  using left internal mammary artery, and right leg greater saphenous vein harvested endoscopically  Patient Location: ICU  Anesthesia Type: General  Level of Consciousness: sedated  Airway and Oxygen Therapy: Patient remains intubated per anesthesia plan  Post-op Pain: none  Post-op Assessment: Post-op Vital signs reviewed, Patient's Cardiovascular Status Stable and Respiratory Function Stable  Post-op Vital Signs: Reviewed and stable  Complications: No apparent anesthesia complications

## 2011-12-06 NOTE — Progress Notes (Signed)
[facility]  Subjective: Appears to be doing well. No SOB  Objective: Vital signs in last 24 hours: Temp:  [97.5 F (36.4 C)-99.9 F (37.7 C)] 98 F (36.7 C) (02/11 0734) Pulse Rate:  [59-75] 70  (02/11 0945) Resp:  [17-21] 17  (02/11 0945) BP: (103-163)/(43-93) 140/63 mmHg (02/11 0945) SpO2:  [90 %-100 %] 98 % (02/11 0945) Last BM Date: 12/03/11  Intake/Output from previous day: 02/10 0701 - 02/11 0700 In: 999.7 [P.O.:240; I.V.:603.7; IV Piggyback:156] Out: [id number] [Urine:1055; Chest Tube:50] Intake/Output this shift: Total I/O In: 347 [P.O.:300; I.V.:43; IV Piggyback:4] Out: 75 [Urine:75]  Medications Current Facility-Administered Medications  Medication Dose Route Frequency Provider Last Rate Last Dose  . 0.9 %  sodium chloride infusion   Intravenous Continuous Tharon Aquas Trigt III, MD 20 mL/hr at 12/04/11 1415    . 0.9 %  sodium chloride infusion  250 mL Intravenous PRN Tharon Aquas Trigt III, MD      . acetaminophen (TYLENOL) tablet 1,000 mg  1,000 mg Oral Q6H Tharon Aquas Trigt III, MD   1,000 mg at 12/06/11 0735   Or  . acetaminophen (TYLENOL) solution 975 mg  975 mg Per Tube Q6H Tharon Aquas Trigt III, MD   975 mg at 12/04/11 1919  . albumin human 5 % solution 250 mL  250 mL Intravenous Q15 min PRN Tharon Aquas Trigt III, MD   250 mL at 12/05/11 0215  . aspirin EC tablet 325 mg  325 mg Oral Daily Tharon Aquas Trigt III, MD   325 mg at 12/06/11 1102   Or  . aspirin chewable tablet 324 mg  324 mg Per Tube Daily Tharon Aquas Trigt III, MD      . bisacodyl (DULCOLAX) EC tablet 10 mg  10 mg Oral Daily Tharon Aquas Trigt III, MD   10 mg at 12/06/11 1102   Or  . bisacodyl (DULCOLAX) suppository 10 mg  10 mg Rectal Daily Tharon Aquas Trigt III, MD      . cefUROXime (ZINACEF) 1.5 g in dextrose 5 % 50 mL IVPB  1.5 g Intravenous Q12H Tharon Aquas Trigt III, MD   1.5 g at 12/06/11 0534  . docusate sodium (COLACE) capsule 200 mg  200 mg Oral Daily Tharon Aquas Trigt III, MD   200  mg at 12/06/11 1101  . DOPamine (INTROPIN) 3.2-5 MG/ML-% infusion           . famotidine (PEPCID) IVPB 20 mg  20 mg Intravenous Q12H Tharon Aquas Trigt III, MD   20 mg at 12/04/11 1400  . furosemide (LASIX) injection 40 mg  40 mg Intravenous Once Tharon Aquas Trigt III, MD   40 mg at 12/05/11 1545  . furosemide (LASIX) injection 40 mg  40 mg Intravenous Once Tharon Aquas Trigt III, MD   40 mg at 12/06/11 1103  . gabapentin (NEURONTIN) capsule 300 mg  300 mg Oral TID Erlene Quan, PA   300 mg at 12/06/11 1111  . glimepiride (AMARYL) tablet 4 mg  4 mg Oral BID Doreene Burke [place], PA   4 mg at 12/06/11 1101  . insulin aspart (novoLOG) injection 0-24 Units  0-24 Units Subcutaneous Q4H Ivin Poot III, MD   2 Units at 12/06/11 [phone]  . insulin glargine (LANTUS) injection 14 Units  14 Units Subcutaneous BH-q7a Tharon Aquas Kerby Less III, MD   14 Units at 12/06/11 [phone]  . insulin regular bolus via infusion 0-10 Units  0-10 Units Intravenous TID  WC Tharon Aquas Trigt III, MD      . levothyroxine (SYNTHROID, LEVOTHROID) tablet 50 mcg  50 mcg Oral Custom Doreene Burke [place], [place]   50 mcg at 12/06/11 0735  . magnesium hydroxide (MILK OF MAGNESIA) suspension 30 mL  30 mL Oral Daily PRN Leonie Man, MD   30 mL at 12/02/11 2154  . meclizine (ANTIVERT) tablet 25 mg  25 mg Oral QID PRN Erlene Quan, PA   25 mg at 12/06/11 0550  . metoCLOPramide (REGLAN) injection 10 mg  10 mg Intravenous Q6H Tharon Aquas Trigt III, MD   10 mg at 12/06/11 0534  . metoprolol (LOPRESSOR) injection 2.5-5 mg  2.5-5 mg Intravenous Q2H PRN Tharon Aquas Trigt III, MD      . metoprolol tartrate (LOPRESSOR) tablet 12.5 mg  12.5 mg Oral BID Tharon Aquas Trigt III, MD   12.5 mg at 12/06/11 1102   Or  . metoprolol tartrate (LOPRESSOR) 25 mg/10 mL oral suspension 12.5 mg  12.5 mg Per Tube BID Tharon Aquas Trigt III, MD   12.5 mg at 12/04/11 1919  . ondansetron (ZOFRAN) injection 4 mg  4 mg Intravenous Q6H PRN Troy Sine, MD   4 mg at 12/06/11 0551  . ondansetron (ZOFRAN)  injection 4 mg  4 mg Intravenous Q6H PRN Tharon Aquas Trigt III, MD      . pantoprazole (PROTONIX) EC tablet 40 mg  40 mg Oral Q1200 Ivin Poot III, MD   40 mg at 12/06/11 1112  . phenylephrine (NEO-SYNEPHRINE) 20,000 mcg in dextrose 5 % 250 mL infusion  30-200 mcg/min Intravenous Once Leonie Man, MD      . potassium chloride SA (K-DUR,KLOR-CON) CR tablet 20 mEq  20 mEq Oral Daily Tharon Aquas Trigt III, MD   20 mEq at 12/06/11 1102  . promethazine (PHENERGAN) injection 12.5 mg  12.5 mg Intravenous Q6H PRN Tharon Aquas Trigt III, MD   12.5 mg at 12/05/11 1344  . rosuvastatin (CRESTOR) tablet 20 mg  20 mg Oral Daily Doreene Burke [place], [place]   20 mg at 12/06/11 1101  . sodium chloride 0.9 % injection 3 mL  3 mL Intravenous PRN Tharon Aquas Trigt III, MD      . traMADol Veatrice Bourbon) tablet 50 mg  50 mg Oral Q6H PRN Tharon Aquas Trigt III, MD   50 mg at 12/06/11 [id number]  . white petrolatum (VASELINE) gel   Topical PRN Leonie Man, MD      . DISCONTD: 0.45 % sodium chloride infusion   Intravenous Continuous Tharon Aquas Trigt III, MD 20 mL/hr at 12/06/11 0900    . DISCONTD: 0.9 %  sodium chloride infusion  250 mL Intravenous Continuous Tharon Aquas Trigt III, MD      . DISCONTD: dexmedetomidine (PRECEDEX) 200 mcg in sodium chloride 0.9 % 50 mL infusion  0.1-0.7 mcg/kg/hr Intravenous Continuous Ivin Poot III, MD   0.044 mcg/kg/hr at 12/04/11 1415  . DISCONTD: DOPamine (INTROPIN) 800 mg in dextrose 5 % 250 mL infusion  2.5 mcg/kg/min Intravenous Titrated Tharon Aquas Trigt III, MD 3 mL/hr at 12/05/11 1532 2.5 mcg/kg/min at 12/05/11 1532  . DISCONTD: insulin aspart (novoLOG) injection 0-24 Units  0-24 Units Subcutaneous Q4H Ivin Poot III, MD   2 Units at 12/05/11 [phone]  . DISCONTD: insulin aspart (novoLOG) injection 0-24 Units  0-24 Units Subcutaneous Q4H Tharon Aquas Trigt III, MD      . DISCONTD: insulin regular (NOVOLIN R,HUMULIN R) 1 Units/mL  in sodium chloride 0.9 % 100 mL infusion   Intravenous Continuous Tharon Aquas  Trigt III, MD      . DISCONTD: midazolam (VERSED) injection 2 mg  2 mg Intravenous Q1H PRN Tharon Aquas Trigt III, MD      . DISCONTD: morphine 4 MG/ML injection 2-5 mg  2-5 mg Intravenous Q1H PRN Tharon Aquas Trigt III, MD      . DISCONTD: oxyCODONE (Oxy IR/ROXICODONE) immediate release tablet 5-10 mg  5-10 mg Oral Q3H PRN Tharon Aquas Trigt III, MD      . DISCONTD: phenylephrine (NEO-SYNEPHRINE) 20,000 mcg in dextrose 5 % 250 mL infusion  0-100 mcg/min Intravenous Continuous Tharon Aquas Trigt III, MD      . DISCONTD: sodium chloride 0.9 % injection 3 mL  3 mL Intravenous Q12H Tharon Aquas Kerby Less III, MD   3 mL at 12/05/11 2157    PE: General appearance: alert, cooperative and no distress Lungs: Decreased BS bilateral bases > on the left. Heart: regular rate and rhythm, S1, S2 normal, no murmur, click, rub or gallop Abdomen: BS + in all Quads. Extremities: No LEE Pulses: 2+ and symmetric  Lab Results:   Basename 12/06/11 0330 12/05/11 1640 12/05/11 1639 12/05/11 0425  WBC 10.5 10.3 -- 9.8  HGB 9.3* 9.6* 9.5* --  HCT 26.7* 27.9* 28.0* --  PLT 120* 102* -- 97*   BMET  Basename 12/06/11 0330 12/05/11 1640 12/05/11 1639 12/05/11 0425 12/04/11 0430  NA 136 -- 141 139 --  K 3.9 -- 4.0 4.5 --  CL 104 -- 104 108 --  CO2 24 -- -- 23 22  GLUCOSE 135* -- 212* 179* --  BUN 17 -- 14 11 --  CREATININE 1.31* 1.24* 1.30* -- --  CALCIUM 8.2* -- -- 7.8* 8.6   PT/INR  Basename 12/04/11 1410  LABPROT 16.8*  INR 1.34    Studies/Results: PORTABLE CHEST - 1 VIEW  Comparison: 12/05/2011  Findings: Right IJ sheath remains in place with tip over the  superior SVC. Mild enlargement cardiomediastinal silhouette again  noted with evidence of CABG. Lung volumes are slightly lower than  previously, with bibasilar curvilinear probable atelectasis. More  focal retrocardiac opacity is also noted. Trace pleural effusion  are present. No pneumothorax.  IMPRESSION:  Decreased aeration with bilateral lower lobe  atelectasis.  Retrocardiac opacity could indicate superimposed pneumonia but is  obscured with portable technique and amenable to follow-up on  future exams.    Assessment/Plan  Principal Problem:  *NSTEMI (non-ST elevated myocardial infarction) Active Problems:  GERD  DIABETES MELLITUS-TYPE II  HTN (hypertension)  Family history of early CAD  Hyperlipidemia LDL goal <70 ABLA  Plan:  POD#2 S/P CABG x3 (LIMA to LAD, SVG -OM, SVG-RCA).  ASA/BB/ Statin on board. Add ACE or ARB as BP allows.  BP 108/64 - 163/76.   HR 64.  Hgb stable.    LOS: 5 days    HAGER,BRYAN W 12/06/2011 11:52 AM     Patient seen and examined. Agree with assessment and plan. Stable hemodynamics. No recurrent cp. Sinus rhythm.  Will add low dose lisinipril at 2.5 mg initially.   Troy Sine, MD, [facility] 12/06/2011 2:26 PM

## 2011-12-07 ENCOUNTER — Encounter (HOSPITAL_COMMUNITY): Payer: Self-pay | Admitting: Cardiology

## 2011-12-07 ENCOUNTER — Inpatient Hospital Stay (HOSPITAL_COMMUNITY): Payer: Medicare Other

## 2011-12-07 DIAGNOSIS — Z951 Presence of aortocoronary bypass graft: Secondary | ICD-10-CM

## 2011-12-07 HISTORY — DX: Presence of aortocoronary bypass graft: Z95.1

## 2011-12-07 LAB — TYPE AND SCREEN
ABO/RH(D): A NEG
Antibody Screen: NEGATIVE
Unit division: 0
Unit division: 0
Unit division: 0
Unit division: 0

## 2011-12-07 LAB — BASIC METABOLIC PANEL
BUN: 25 mg/dL — ABNORMAL HIGH (ref 6–23)
CO2: 26 mEq/L (ref 19–32)
Calcium: 8.2 mg/dL — ABNORMAL LOW (ref 8.4–10.5)
Chloride: 104 mEq/L (ref 96–112)
Creatinine, Ser: 1.52 mg/dL — ABNORMAL HIGH (ref 0.50–1.10)
GFR calc Af Amer: 39 mL/min — ABNORMAL LOW (ref >90)
GFR calc non Af Amer: 33 mL/min — ABNORMAL LOW (ref >90)
Glucose, Bld: 85 mg/dL (ref 70–99)
Potassium: 3.8 mEq/L (ref 3.5–5.1)
Sodium: 138 mEq/L (ref 135–145)

## 2011-12-07 LAB — CBC
HCT: 24.3 % — ABNORMAL LOW (ref 36.0–46.0)
Hemoglobin: 8.5 g/dL — ABNORMAL LOW (ref 12.0–15.0)
MCH: 32.7 pg (ref 26.0–34.0)
MCHC: 35 g/dL (ref 30.0–36.0)
MCV: 93.5 fL (ref 78.0–100.0)
Platelets: 129 10*3/uL — ABNORMAL LOW (ref 150–400)
RBC: 2.6 MIL/uL — ABNORMAL LOW (ref 3.87–5.11)
RDW: 14.4 % (ref 11.5–15.5)
WBC: 10 10*3/uL (ref 4.0–10.5)

## 2011-12-07 LAB — GLUCOSE, CAPILLARY
Glucose-Capillary: 126 mg/dL — ABNORMAL HIGH (ref 70–99)
Glucose-Capillary: 60 mg/dL — ABNORMAL LOW (ref 70–99)
Glucose-Capillary: 67 mg/dL — ABNORMAL LOW (ref 70–99)
Glucose-Capillary: 210 mg/dL — ABNORMAL HIGH (ref 70–99)
Glucose-Capillary: 209 mg/dL — ABNORMAL HIGH (ref 70–99)
Glucose-Capillary: 141 mg/dL — ABNORMAL HIGH (ref 70–99)

## 2011-12-07 MED ORDER — ACETAMINOPHEN 160 MG/5ML PO SOLN
975.0000 mg | Freq: Four times a day (QID) | ORAL | Status: AC
  Filled 2011-12-07: qty 40.6

## 2011-12-07 MED ORDER — GABAPENTIN 300 MG PO CAPS
300.0000 mg | ORAL_CAPSULE | Freq: Two times a day (BID) | ORAL | Status: DC
  Administered 2011-12-07 – 2011-12-10 (×6): 300 mg via ORAL
  Filled 2011-12-07 (×8): qty 1

## 2011-12-07 MED ORDER — ACETAMINOPHEN 500 MG PO TABS
1000.0000 mg | ORAL_TABLET | Freq: Four times a day (QID) | ORAL | Status: AC
  Administered 2011-12-07 – 2011-12-09 (×4): 1000 mg via ORAL
  Filled 2011-12-07 (×5): qty 2

## 2011-12-07 NOTE — Progress Notes (Signed)
UR Completed.  [name] [id number] 12/07/2011

## 2011-12-07 NOTE — Progress Notes (Addendum)
Patient s/p CABG.  History of Type 2 Diabetes.  Home medications include:  Amaryl 4 mg bid & Janumet 50/1000 bid.    A1C 7.3% (12/03/11).  Fairly well controlled at home.  May want to send patient home on low dose Lantus and Janumet and d/c home Amaryl.  Recommend the following:  1. Decrease Lantus to 12 units daily in the morning 2. D/C Amaryl 4 mg bid  Please consider sending patient home on Lantus solostar pen and Janumet.  Addendum 1438pm: Spoke with patient.  Patient told me she often has elevated fasting sugars in the morning (150's or above).  Is willing to take insulin at home.  Would like the insulin pen if she needs insulin for home. Patient also told me she is interested in seeing an Endocrinologist.  Gave pt the names of several DM specialists in [place].     MD, if you decide to send pt home on Lantus, please d/c home Amaryl and continue Janumet.    Will follow. Wyn Quaker RN, MSN, CDE Diabetes Coordinator Inpatient Diabetes [facility] [phone]

## 2011-12-07 NOTE — Progress Notes (Signed)
CBG: 60  Treatment: 15 GM carbohydrate snack  Symptoms: None  Follow-up CBG: Time:0800 CBG Result:126  Possible Reasons for Event: Inadequate meal intake  Comments/MD notified:    Stormy Fabian

## 2011-12-07 NOTE — Progress Notes (Signed)
Subjective: POST-OP Day 3 CORONARY ARTERY BYPASS GRAFTING (CABG)x3  LIMA-LAD,SVG-OM, SVG-RCA     Objective: Vital signs in last 24 hours: Temp:  [97.8 F (36.6 C)-98.3 F (36.8 C)] 97.8 F (36.6 C) (02/12 0732) Pulse Rate:  [25-73] 25  (02/12 0800) Resp:  [15-23] 20  (02/12 0800) BP: (95-164)/(44-128) 155/128 mmHg (02/12 0800) SpO2:  [86 %-100 %] 86 % (02/12 0800) Weight:  [70.4 kg (155 lb 3.3 oz)] 70.4 kg (155 lb 3.3 oz) (02/12 0600) Weight change:  Last BM Date: 12/03/11 Intake/Output from previous day: -674 02/11 0701 - 02/12 0700 In: 952.4 [P.O.:900; I.V.:46.4; IV Piggyback:6] Out: [id number] [Urine:1625] Intake/Output this shift: Total I/O In: 120 [P.O.:120] Out: 250 [Urine:250]  PE:  General: Awake, NAD Heart: RRR, s1/s2, no m/r/g's, sternal wound healing nicely Lungs: lungs clear Abd: soft nontender Ext: 2+ pulses, no edema Neuro: A&Ox3     Lab Results:  Basename 12/07/11 0440 12/06/11 0330  WBC 10.0 10.5  HGB 8.5* 9.3*  HCT 24.3* 26.7*  PLT 129* 120*   BMET  Basename 12/07/11 0440 12/06/11 0330  NA 138 136  K 3.8 3.9  CL 104 104  CO2 26 24  GLUCOSE 85 135*  BUN 25* 17  CREATININE 1.52* 1.31*  CALCIUM 8.2* 8.2*   No results found for this basename: TROPONINI:2,CK,MB:2 in the last 72 hours  Lab Results  Component Value Date   CHOL 254* 12/02/2011   HDL 36* 12/02/2011   LDLCALC 143* 12/02/2011   TRIG 375* 12/02/2011   CHOLHDL 7.1 12/02/2011   Lab Results  Component Value Date   HGBA1C 7.3* 12/03/2011     Lab Results  Component Value Date   TSH 1.983 12/01/2011    Hepatic Function Panel No results found for this basename: PROT,ALBUMIN,AST,ALT,ALKPHOS,BILITOT,BILIDIR,IBILI in the last 72 hours No results found for this basename: CHOL in the last 72 hours No results found for this basename: PROTIME in the last 72 hours  EKG: Low atrial or junctional rhythm.   Studies/Results: Dg Chest 2 View  12/07/2011  *RADIOLOGY REPORT*  Clinical Data:  Postoperative radiograph.  CABG.  CHEST - 2 VIEW  Comparison: 12/06/2011.  Findings: Right IJ vascular sheath removed.  Postoperative changes of median sternotomy / CABG.  Epicardial pacing leads remain present.  Small bilateral pleural effusions layer posteriorly. Basilar atelectasis.  No airspace disease.  Cardiopericardial silhouette is mildly enlarged.  IMPRESSION: Expected postoperative appearance of the chest after CABG.  Small pleural effusions and basilar atelectasis.  Original Report Authenticated By: Dereck Ligas, M.D.   Dg Chest Portable 1 View In Am  12/06/2011  *RADIOLOGY REPORT*  Clinical Data: Status post CABG  PORTABLE CHEST - 1 VIEW  Comparison: 12/05/2011  Findings: Right IJ sheath remains in place with tip over the superior SVC.  Mild enlargement cardiomediastinal silhouette again noted with evidence of CABG.  Lung volumes are slightly lower than previously, with bibasilar curvilinear probable atelectasis.  More focal retrocardiac opacity is also noted.  Trace pleural effusion are present.  No pneumothorax.  IMPRESSION: Decreased aeration with bilateral lower lobe atelectasis. Retrocardiac opacity could indicate superimposed pneumonia but is obscured with portable technique and amenable to follow-up on future exams.  Original Report Authenticated By: Arline Asp, M.D.   Dg Chest Port 1 View  12/05/2011  *RADIOLOGY REPORT*  Clinical Data: Shortness of breath, evaluate for pneumothorax  PORTABLE CHEST - 1 VIEW  Comparison: 12/04/2010 at 0543 hrs  Findings: Interval removal of the left apical chest tube, mediastinal drain,  and right IJ Swan-Ganz catheter.  No pneumothorax is seen.  Suspected small bilateral pleural effusions with left lower lobe atelectasis.  Stable right IJ venous sheath.  Mild cardiomegaly.  Postsurgical changes related to prior CABG.  IMPRESSION: Interval removal of the left apical chest tube, mediastinal drain, and Swan-Ganz catheter.  No pneumothorax is seen.   Otherwise unchanged.  Original Report Authenticated By: Julian Hy, M.D.    Medications: I have reviewed the patient's current medications.    Marland Kitchen acetaminophen  1,000 mg Oral Q6H   Or  . acetaminophen (TYLENOL) oral liquid 160 mg/5 mL  975 mg Per Tube Q6H  . aspirin EC  325 mg Oral Daily   Or  . aspirin  324 mg Per Tube Daily  . bisacodyl  10 mg Oral Daily   Or  . bisacodyl  10 mg Rectal Daily  . docusate sodium  200 mg Oral Daily  . furosemide  40 mg Intravenous Once  . furosemide  40 mg Oral Daily  . gabapentin  300 mg Oral TID  . glimepiride  4 mg Oral BID  . insulin aspart  0-24 Units Subcutaneous TID AC & HS  . insulin glargine  14 Units Subcutaneous BH-q7a  . levothyroxine  50 mcg Oral Custom  . lisinopril  2.5 mg Oral Daily  . metoCLOPramide (REGLAN) injection  10 mg Intravenous Q6H  . moving right along book   Does not apply Once  . pantoprazole  40 mg Oral QAC breakfast  . potassium chloride  20 mEq Oral Daily  . rosuvastatin  10 mg Oral q1800  . sodium chloride  3 mL Intravenous Q12H  . DISCONTD: insulin aspart  0-24 Units Subcutaneous Q4H  . DISCONTD: insulin regular  0-10 Units Intravenous TID WC  . DISCONTD: metoprolol tartrate  12.5 mg Per Tube BID  . DISCONTD: metoprolol tartrate  12.5 mg Oral BID  . DISCONTD: pantoprazole  40 mg Oral Q1200  . DISCONTD: phenylephrine (NEO-SYNEPHRINE) Adult infusion  30-200 mcg/min Intravenous Once  . DISCONTD: rosuvastatin  20 mg Oral Daily  . DISCONTD: sodium chloride  3 mL Intravenous Q12H   Assessment/Plan: Patient Active Problem List  Diagnoses  . HEMORRHOIDS  . ESOPHAGEAL STRICTURE  . GERD  . CONSTIPATION  . CONSTIPATION, CHRONIC  . SALPINGO-OOPHORITIS  . MENOPAUSE, SURGICAL  . BACK PAIN, LUMBAR  . FLATULENCE-GAS-BLOATING  . ANKLE INJURY, RIGHT  . TOTAL KNEE REPLACEMENT, LEFT, HX OF  . DIABETES MELLITUS-TYPE II  . DYSPHAGIA UNSPECIFIED  . ABDOMINAL PAIN, EPIGASTRIC  . NSTEMI (non-ST elevated myocardial  infarction)  . HTN (hypertension)  . Family history of early CAD  . Hyperlipidemia LDL goal <70  . S/P CABG (coronary artery bypass graft), 12/04/11   PLAN:  3rd day post op.   LOS: 6 days   INGOLD,LAURA R 12/07/2011, 9:46 AM  Pt. Seen and examined. Agree with the NP/PA-C note as written. Seems to be coming along nicely.  Rhythm appears to be junctional of low-atrial. No a-fib. ?HR of 25 at 0800 .Marland Kitchen May be an error.  [name], MD, [facility] Attending Cardiologist [facility] [place]

## 2011-12-07 NOTE — Progress Notes (Signed)
UR Completed.  Vergie Living [id number] 12/07/2011

## 2011-12-07 NOTE — Progress Notes (Signed)
CARDIAC REHAB PHASE I   PRE:  Rate/Rhythm: 73 SR  BP:  Supine:   Sitting: 130/66  Standing:    SaO2: 97 RA  MODE:  Ambulation: 350 ft   POST:  Rate/Rhythem: 86 SR  BP:  Supine:   Sitting: 140/70  Standing:    SaO2: 94 RA 1320-1405 Assisted X 1 and used walker to ambulate. Gait steady with walker. DOE, but RA sat after walk 94%. To recliner after walk with call light in reach. Pt tired after walk.  Deon Pilling

## 2011-12-07 NOTE — Progress Notes (Addendum)
[address]            [place] [address]          [phone]     3 Days Post-Op Procedure(s) (LRB): CORONARY ARTERY BYPASS GRAFTING (CABG) (N/A)  Subjective: Doing well, no complaints.  Walked in halls, nausea resolved.  Awaiting transfer to 2000.  Objective: Vital signs in last 24 hours: Patient Vitals for the past 24 hrs:  BP Temp Temp src Pulse Resp SpO2 Weight  12/07/11 0800 155/128 mmHg - - 25  20  86 % -  12/07/11 0732 - 97.8 F (36.6 C) Oral - - - -  12/07/11 0700 154/44 mmHg - - 62  15  100 % -  12/07/11 0600 - - - 71  23  93 % 70.4 kg (155 lb 3.3 oz)  12/07/11 0500 142/50 mmHg - - 58  15  100 % -  12/07/11 0400 136/50 mmHg - - 62  15  97 % -  12/07/11 0300 146/60 mmHg - - 58  15  100 % -  12/07/11 0200 131/46 mmHg - - 58  16  100 % -  12/07/11 0100 136/48 mmHg - - 58  16  99 % -  12/07/11 0000 142/66 mmHg - - 58  16  99 % -  12/06/11 2300 142/56 mmHg - - 60  16  99 % -  12/06/11 2200 149/65 mmHg - - 62  15  99 % -  12/06/11 2100 156/59 mmHg - - 64  16  98 % -  12/06/11 2000 123/50 mmHg - - 64  16  90 % -  12/06/11 1914 - 98.3 F (36.8 C) Oral - - - -  12/06/11 1900 139/48 mmHg - - 64  21  91 % -  12/06/11 1805 139/48 mmHg - - - - - -  12/06/11 1800 95/79 mmHg - - 64  20  93 % -  12/06/11 1700 133/50 mmHg - - 63  19  94 % -  12/06/11 1629 - 98.2 F (36.8 C) Axillary - - - -  12/06/11 1600 139/70 mmHg - - 66  19  94 % -  12/06/11 1500 136/49 mmHg - - 59  16  98 % -  12/06/11 1400 134/46 mmHg - - 59  19  97 % -  12/06/11 1345 131/49 mmHg - - 59  19  94 % -  12/06/11 1330 139/54 mmHg - - 60  19  92 % -  12/06/11 1315 134/54 mmHg - - 59  20  91 % -  12/06/11 1300 122/48 mmHg - - 59  19  94 % -  12/06/11 1248 - 98.2 F (36.8 C) Oral - - - -  12/06/11 1245 135/51 mmHg - - 58  22  93 % -  12/06/11 1230 142/59 mmHg - - 58  18  94 % -  12/06/11 1215 140/57 mmHg - - 58  17  93 % -  12/06/11 1200 154/53 mmHg - - 66  17  98 % -    12/06/11 1145 140/74 mmHg - - 66  20  100 % -  12/06/11 1130 114/95 mmHg - - 67  19  100 % -  12/06/11 1115 154/113 mmHg - - 70  20  100 % -  12/06/11 1100 149/69 mmHg - - 71  22  97 % -  12/06/11 1045 137/66 mmHg - - 69  19  99 % -  12/06/11 1030 115/75 mmHg - - 69  19  99 % -  12/06/11 1015 164/59 mmHg - - 73  20  98 % -  12/06/11 1000 134/53 mmHg - - 67  17  99 % -  12/06/11 0945 140/63 mmHg - - 70  17  98 % -   Current Weight  12/07/11 70.4 kg (155 lb 3.3 oz)   Pre-op wt= 64 kg  Intake/Output from previous day: 02/11 0701 - 02/12 0700 In: 952.4 [P.O.:900; I.V.:46.4; IV Piggyback:6] Out: 1625 [Urine:1625]  CBGs 152-169-160-85-67-126  PHYSICAL EXAM:  Heart: RRR Lungs: diminished BS in bases Wound: dressed and dry Extremities: mild LE edema  Lab Results: CBC: Basename 12/07/11 0440 12/06/11 0330  WBC 10.0 10.5  HGB 8.5* 9.3*  HCT 24.3* 26.7*  PLT 129* 120*   BMET:  Basename 12/07/11 0440 12/06/11 0330  NA 138 136  K 3.8 3.9  CL 104 104  CO2 26 24  GLUCOSE 85 135*  BUN 25* 17  CREATININE 1.52* 1.31*  CALCIUM 8.2* 8.2*    PT/INR:  Basename 12/04/11 1410  LABPROT 16.8*  INR 1.34   CXR: stable   Assessment/Plan: S/P Procedure(s) (LRB): CORONARY ARTERY BYPASS GRAFTING (CABG) (N/A) CV- not on beta blocker secondary to junctional rhythm.  BPs trending up.  On ACE-I - may need to hold in light of increased Cr. Vol overload- diurese.  UOP excellent. DM- sugars well controlled.  Back on po Amaryl, Metformin not started due to elevated Cr. Continue Lantus for now.  Hopefully can restart JanuMet once Cr improves. ARI- Cr up some today- monitor.   Daughter states pt only takes Neurontin bid at home rather than tid as prescribed due to sedation.  Will decrease dose to bid as taken at home. Acute post-op blood loss anemia- monitor. Tx 2000   LOS: 6 days    COLLINS,GINA H 12/07/2011   I have seen and examined the patient and agree with the assessment and  plan as outlined.  Rexene Alberts 12/07/2011 7:37 PM

## 2011-12-07 NOTE — Progress Notes (Signed)
Ambulated patient 300 ft with rolling walker, assist x 1.  Tolerated well.  Returned to bed.  Patient winded after walk.  VSS.  Will continue to monitor.

## 2011-12-08 LAB — GLUCOSE, CAPILLARY
Glucose-Capillary: 141 mg/dL — ABNORMAL HIGH (ref 70–99)
Glucose-Capillary: 276 mg/dL — ABNORMAL HIGH (ref 70–99)
Glucose-Capillary: 114 mg/dL — ABNORMAL HIGH (ref 70–99)
Glucose-Capillary: 114 mg/dL — ABNORMAL HIGH (ref 70–99)

## 2011-12-08 MED ORDER — METOPROLOL TARTRATE 25 MG PO TABS
25.0000 mg | ORAL_TABLET | Freq: Two times a day (BID) | ORAL | Status: DC
  Administered 2011-12-08 (×2): 25 mg via ORAL
  Filled 2011-12-08 (×3): qty 1

## 2011-12-08 MED ORDER — POLYSACCHARIDE IRON 150 MG PO CAPS
150.0000 mg | ORAL_CAPSULE | Freq: Every day | ORAL | Status: DC
  Administered 2011-12-08 – 2011-12-10 (×3): 150 mg via ORAL
  Filled 2011-12-08 (×3): qty 1

## 2011-12-08 MED ORDER — LACTULOSE 10 GM/15ML PO SOLN
20.0000 g | Freq: Once | ORAL | Status: AC
  Administered 2011-12-08: 20 g via ORAL
  Filled 2011-12-08: qty 30

## 2011-12-08 MED FILL — Heparin Sodium (Porcine) Inj 1000 Unit/ML: INTRAMUSCULAR | Qty: 30 | Status: AC

## 2011-12-08 MED FILL — Mannitol IV Soln 20%: INTRAVENOUS | Qty: 500 | Status: AC

## 2011-12-08 MED FILL — Lidocaine HCl IV Inj 20 MG/ML: INTRAVENOUS | Qty: 5 | Status: AC

## 2011-12-08 MED FILL — Sodium Chloride IV Soln 0.9%: INTRAVENOUS | Qty: 1000 | Status: AC

## 2011-12-08 MED FILL — Sodium Bicarbonate IV Soln 8.4%: INTRAVENOUS | Qty: 50 | Status: AC

## 2011-12-08 MED FILL — Electrolyte-R (PH 7.4) Solution: INTRAVENOUS | Qty: 4000 | Status: AC

## 2011-12-08 MED FILL — Heparin Sodium (Porcine) Inj 1000 Unit/ML: INTRAMUSCULAR | Qty: 10 | Status: AC

## 2011-12-08 MED FILL — Sodium Chloride Irrigation Soln 0.9%: Qty: 3000 | Status: AC

## 2011-12-08 NOTE — Progress Notes (Signed)
Patient ambulated 600 ft with rolling walking, assist x 1.  Tolerated well.  VSS.  Returned to chair.  Will continue to monitor.

## 2011-12-08 NOTE — Progress Notes (Signed)
4 Days Post-Op Procedure(s) (LRB): CORONARY ARTERY BYPASS GRAFTING (CABG) (N/A)  Subjective: Patient with complaints of constipation.  Objective: Vital signs in last 24 hours: Patient Vitals for the past 24 hrs:  BP Temp Temp src Pulse Resp SpO2 Weight  12/08/11 0556 142/55 mmHg 98.3 F (36.8 C) Oral 70  19  94 % -  12/08/11 0349 157/66 mmHg 98.1 F (36.7 C) Oral 80  20  92 % 152 lb 8.9 oz (69.2 kg)  12/07/11 2045 152/53 mmHg 98.7 F (37.1 C) Oral 71  18  95 % -  12/07/11 1450 156/75 mmHg 97.6 F (36.4 C) Oral 72  18  97 % -  12/07/11 1057 177/83 mmHg 97.9 F (36.6 C) Oral 74  20  98 % -  12/07/11 1000 - - - 75  20  98 % -  12/07/11 0900 146/66 mmHg - - 72  21  97 % -  12/07/11 0800 155/128 mmHg - - 25  20  86 % -   Pre op weight  64 kg Current Weight  12/08/11 152 lb 8.9 oz (69.2 kg)      Intake/Output from previous day: 02/12 0701 - 02/13 0700 In: 363 [P.O.:360; I.V.:3] Out: 1700 [Urine:1700]   Physical Exam:  Cardiovascular: RRR, no murmurs, gallops, or rubs. Pulmonary: Clear to auscultation bilaterally; no rales, wheezes, or rhonchi. Abdomen: Soft, non tender, bowel sounds present. Extremities: Mild bilateral lower extremity edema. Wounds: Clean and dry.  No erythema or signs of infection.  Lab Results: CBC: Basename 12/07/11 0440 12/06/11 0330  WBC 10.0 10.5  HGB 8.5* 9.3*  HCT 24.3* 26.7*  PLT 129* 120*   BMET:  Basename 12/07/11 0440 12/06/11 0330  NA 138 136  K 3.8 3.9  CL 104 104  CO2 26 24  GLUCOSE 85 135*  BUN 25* 17  CREATININE 1.52* 1.31*  CALCIUM 8.2* 8.2*    PT/INR: No results found for this basename: LABPROT,INR in the last 72 hours ABG:  INR: Will add last result for INR, ABG once components are confirmed Will add last 4 CBG results once components are confirmed  Assessment/Plan:  1. CV - SR. Lisinopril 2.5 po daily.Consider low dose beta blocker if maintains SR. 2.  Pulmonary - Encourage incentive spirometer. 3. Volume  Overload - Continue with diuresis. 4.  Acute blood loss anemia - H/H 8.5/24.3. Start Nu Iron. 5.Thrombocytopenia-Last platelets up 129,000. 6.Last creatinine up to 1.52. Will recheck in am.May need to hold Lisinopril if continues to increase. 7.LOC constipation. 8.DM-CBGs 209/141/141.Pre op HGA1C 7.3.Continue glimiperide.Stop scheduled insulin. 9.Remove EPW in am.   [name] MPA-C 12/08/2011

## 2011-12-08 NOTE — Progress Notes (Signed)
   CARE MANAGEMENT NOTE 12/08/2011  Patient:  [name], [name]   Account Number:  [id number]  Date Initiated:  12/08/2011  Documentation initiated by:  Ebrima Ranta  Subjective/Objective Assessment:   PT S/P CABG ON 12/05/11.  PTA, PT INDEPENDENT, LIVES ALONE.     Action/Plan:   MET WITH PT AND DAUGHTER, PER REQUEST TO DISCUSS DC PLANNING.   Anticipated DC Date:  12/11/2011   Anticipated DC Plan:  [place]  CM consult      [facility] Choice  HOME HEALTH   Choice offered to / List presented to:  [name]        [place] arranged  HH-1 RN      [place].   Status of service:  Completed, signed off Medicare Important Message given?   (If response is "NO", the following Medicare IM given date fields will be blank) Date Medicare IM given:   Date Additional Medicare IM given:    Discharge Disposition:  [place]  Per UR Regulation:    Comments:    12/08/11 Mita Vallo,RN,BSN  1500 MET WITH PT AND DAUGHTER TO DISCUSS DC PLANS.  PT TO DC HOME WITH SUPPORT OF CHILDREN AND FRIENDS PROVIDING 24HR CARE.  SHE MAY HAVE A RW AT HOME--DEFINATELY HAS A TUB SEAT AND A BSC.  DAUGHTER IS A NURSE AT [facility] FOLLOW UP. WILL ARRANGE HHRN FOR RESTORATIVE CARE.  DAUGHTER GIVEN LIST OF PRIVATE DUTY SITTERS, PER HER REQUEST.  PT NOT WILLING TO CONSIDER [facility] FOR REHAB, AS SHE HAS HAD A PREVIOUS BAD EXPERIENCE. WILL ARRANGE SERVICES/CONT TO FOLLOW FOR HOME NEEDS. Phone [phone]

## 2011-12-08 NOTE — Progress Notes (Signed)
CARDIAC REHAB PHASE I   PRE:  Rate/Rhythm: 73SR  BP:  Supine:   Sitting: 167/64  Standing:    SaO2: 99%RA  MODE:  Ambulation: 550 ft   POST:  Rate/Rhythem: 91Sr  BP:  Supine: 183/83  194/75   Manual 180/80  Sitting:   Standing:    SaO2: 99%RA 0955-1026 Pt walked 550 ft on RA with steady gait. Tolerated well except for elevated BP. Notified pt's RN. To bed after walk.  [name]

## 2011-12-08 NOTE — Progress Notes (Signed)
POST-OP Day 4  CORONARY ARTERY BYPASS GRAFTING (CABG)x3  LIMA-LAD,SVG-OM, SVG-RCA   Subjective: No specific complaints, some cough.  Objective: Vital signs in last 24 hours: Temp:  [97.6 F (36.4 C)-98.7 F (37.1 C)] 98.3 F (36.8 C) (02/13 0556) Pulse Rate:  [70-80] 70  (02/13 0556) Resp:  [18-20] 19  (02/13 0556) BP: (142-177)/(53-83) 142/55 mmHg (02/13 0556) SpO2:  [92 %-98 %] 94 % (02/13 0556) Weight:  [69.2 kg (152 lb 8.9 oz)] 69.2 kg (152 lb 8.9 oz) (02/13 0349) Weight change: -1.2 kg (-2 lb 10.3 oz) Last BM Date: 12/01/11 Intake/Output from previous day: -1337 02/12 0701 - 02/13 0700 In: 363 [P.O.:360; I.V.:3] Out: 1700 [Urine:1700] Intake/Output this shift:   Weight: 69.2 down from 71.5 post op PE: General: alert and oriented, pleasant affect. Heart:S1S2, RRR Lungs: few rhonchi no wheezes   Abd:+ BS, soft non tender Ext:no edema Neuro:A&O X 3   Lab Results:  Basename 12/07/11 0440 12/06/11 0330  WBC 10.0 10.5  HGB 8.5* 9.3*  HCT 24.3* 26.7*  PLT 129* 120*   BMET  Basename 12/07/11 0440 12/06/11 0330  NA 138 136  K 3.8 3.9  CL 104 104  CO2 26 24  GLUCOSE 85 135*  BUN 25* 17  CREATININE 1.52* 1.31*  CALCIUM 8.2* 8.2*   No results found for this basename: TROPONINI:2,CK,MB:2 in the last 72 hours  Lab Results  Component Value Date   CHOL 254* 12/02/2011   HDL 36* 12/02/2011   LDLCALC 143* 12/02/2011   TRIG 375* 12/02/2011   CHOLHDL 7.1 12/02/2011   Lab Results  Component Value Date   HGBA1C 7.3* 12/03/2011     Lab Results  Component Value Date   TSH 1.983 12/01/2011    Hepatic Function Panel No results found for this basename: PROT,ALBUMIN,AST,ALT,ALKPHOS,BILITOT,BILIDIR,IBILI in the last 72 hours No results found for this basename: CHOL in the last 72 hours No results found for this basename: PROTIME in the last 72 hours    EKG: Orders placed during the hospital encounter of 12/01/11  . EKG 12-LEAD  . EKG  . EKG 12-LEAD  . EKG 12-LEAD      Studies/Results: Dg Chest 2 View  12/07/2011  *RADIOLOGY REPORT*  Clinical Data: Postoperative radiograph.  CABG.  CHEST - 2 VIEW  Comparison: 12/06/2011.  Findings: Right IJ vascular sheath removed.  Postoperative changes of median sternotomy / CABG.  Epicardial pacing leads remain present.  Small bilateral pleural effusions layer posteriorly. Basilar atelectasis.  No airspace disease.  Cardiopericardial silhouette is mildly enlarged.  IMPRESSION: Expected postoperative appearance of the chest after CABG.  Small pleural effusions and basilar atelectasis.  Original Report Authenticated By: Dereck Ligas, M.D.    Medications: I have reviewed the patient's current medications.    Marland Kitchen acetaminophen  1,000 mg Oral Q6H   Or  . acetaminophen (TYLENOL) oral liquid 160 mg/5 mL  975 mg Per Tube Q6H  . aspirin EC  325 mg Oral Daily   Or  . aspirin  324 mg Per Tube Daily  . bisacodyl  10 mg Oral Daily   Or  . bisacodyl  10 mg Rectal Daily  . docusate sodium  200 mg Oral Daily  . furosemide  40 mg Oral Daily  . gabapentin  300 mg Oral BID  . glimepiride  4 mg Oral BID  . insulin aspart  0-24 Units Subcutaneous TID AC & HS  . lactulose  20 g Oral Once  . levothyroxine  50 mcg Oral  Custom  . lisinopril  2.5 mg Oral Daily  . pantoprazole  40 mg Oral QAC breakfast  . polysaccharide iron  150 mg Oral Daily  . potassium chloride  20 mEq Oral Daily  . rosuvastatin  10 mg Oral q1800  . sodium chloride  3 mL Intravenous Q12H  . DISCONTD: acetaminophen (TYLENOL) oral liquid 160 mg/5 mL  975 mg Per Tube Q6H  . DISCONTD: acetaminophen  1,000 mg Oral Q6H  . DISCONTD: gabapentin  300 mg Oral TID  . DISCONTD: insulin glargine  14 Units Subcutaneous BH-q7a   Assessment/Plan: Patient Active Problem List  Diagnoses  . HEMORRHOIDS  . ESOPHAGEAL STRICTURE  . GERD  . CONSTIPATION  . CONSTIPATION, CHRONIC  . SALPINGO-OOPHORITIS  . MENOPAUSE, SURGICAL  . BACK PAIN, LUMBAR  . FLATULENCE-GAS-BLOATING  .  ANKLE INJURY, RIGHT  . TOTAL KNEE REPLACEMENT, LEFT, HX OF  . DIABETES MELLITUS-TYPE II  . DYSPHAGIA UNSPECIFIED  . ABDOMINAL PAIN, EPIGASTRIC  . NSTEMI (non-ST elevated myocardial infarction)  . HTN (hypertension)  . Family history of early CAD  . Hyperlipidemia LDL goal <70  . S/P CABG (coronary artery bypass graft), 12/04/11   PLAN:POST-OP Day 4 Anemic  Doing well, progressing. Maintaining SR to ? Junctional yesterday.   LOS: 7 days   INGOLD,LAURA R 12/08/2011, 9:09 AM    Patient seen and examined. Agree with assessment and plan.  Currently in NSR, but had prob PAF and transient WCT earlier.  Will Start beta blockade.  Cardiac rehab.  Troy Sine, MD, [facility] 12/08/2011 10:49 AM

## 2011-12-08 NOTE — Progress Notes (Signed)
Inpatient Diabetes Program Recommendations  AACE/ADA: New Consensus Statement on Inpatient Glycemic Control (2009)  Target Ranges:  Prepandial:   less than 140 mg/dL      Peak postprandial:   less than 180 mg/dL (1-2 hours)      Critically ill patients:  140 - 180 mg/dL   Reason for Visit: Hyperglycemia  Inpatient Diabetes Program Recommendations Oral Agents: Pt takes Janumet at home.  Please while here order Tradjenta 5 mg/dL and Metformin 1000 bid (tradjenta on formulary for [place])  Note: Thank you, Rosita Kea, RN, CNS, Diabetes Coordinator [phone])

## 2011-12-09 ENCOUNTER — Inpatient Hospital Stay (HOSPITAL_COMMUNITY): Payer: Medicare Other

## 2011-12-09 LAB — GLUCOSE, CAPILLARY
Glucose-Capillary: 107 mg/dL — ABNORMAL HIGH (ref 70–99)
Glucose-Capillary: 211 mg/dL — ABNORMAL HIGH (ref 70–99)
Glucose-Capillary: 209 mg/dL — ABNORMAL HIGH (ref 70–99)
Glucose-Capillary: 73 mg/dL (ref 70–99)

## 2011-12-09 LAB — BASIC METABOLIC PANEL
BUN: 24 mg/dL — ABNORMAL HIGH (ref 6–23)
CO2: 28 mEq/L (ref 19–32)
Calcium: 9 mg/dL (ref 8.4–10.5)
Chloride: 107 mEq/L (ref 96–112)
Creatinine, Ser: 1.19 mg/dL — ABNORMAL HIGH (ref 0.50–1.10)
GFR calc Af Amer: 52 mL/min — ABNORMAL LOW (ref >90)
GFR calc non Af Amer: 45 mL/min — ABNORMAL LOW (ref >90)
Glucose, Bld: 111 mg/dL — ABNORMAL HIGH (ref 70–99)
Potassium: 4.6 mEq/L (ref 3.5–5.1)
Sodium: 144 mEq/L (ref 135–145)

## 2011-12-09 MED ORDER — METOPROLOL TARTRATE 25 MG PO TABS: 25.0000 mg | ORAL_TABLET | Freq: Three times a day (TID) | ORAL | Status: DC

## 2011-12-09 MED ORDER — POLYSACCHARIDE IRON 150 MG PO CAPS: 150.0000 mg | ORAL_CAPSULE | Freq: Every day | ORAL | Status: DC

## 2011-12-09 MED ORDER — METOPROLOL TARTRATE 25 MG PO TABS
25.0000 mg | ORAL_TABLET | Freq: Three times a day (TID) | ORAL | Status: DC
  Administered 2011-12-09 – 2011-12-10 (×4): 25 mg via ORAL
  Filled 2011-12-09 (×6): qty 1

## 2011-12-09 MED ORDER — FUROSEMIDE 40 MG PO TABS: 40.0000 mg | ORAL_TABLET | Freq: Every day | ORAL | Status: DC

## 2011-12-09 MED ORDER — ASPIRIN 325 MG PO TBEC: 325.0000 mg | DELAYED_RELEASE_TABLET | Freq: Every day | ORAL | Status: DC

## 2011-12-09 MED ORDER — GABAPENTIN 300 MG PO CAPS: 300.0000 mg | ORAL_CAPSULE | Freq: Two times a day (BID) | ORAL | Status: DC

## 2011-12-09 MED ORDER — ROSUVASTATIN CALCIUM 10 MG PO TABS: 10.0000 mg | ORAL_TABLET | Freq: Every day | ORAL | Status: DC

## 2011-12-09 MED ORDER — LISINOPRIL 2.5 MG PO TABS: 2.5000 mg | ORAL_TABLET | Freq: Every day | ORAL | Status: DC

## 2011-12-09 MED ORDER — TRAMADOL HCL 50 MG PO TABS: 50.0000 mg | ORAL_TABLET | Freq: Four times a day (QID) | ORAL | Status: AC | PRN

## 2011-12-09 NOTE — Progress Notes (Addendum)
5 Days Post-Op Procedure(s) (LRB): CORONARY ARTERY BYPASS GRAFTING (CABG) (N/A)  Subjective: Patient with a lot of flatus yesterday.  Objective: Vital signs in last 24 hours: Patient Vitals for the past 24 hrs:  BP Temp Temp src Pulse Resp SpO2  12/09/11 0452 157/55 mmHg 98.4 F (36.9 C) Oral 66  20  95 %  12/08/11 2053 148/50 mmHg 98.7 F (37.1 C) Oral 71  18  93 %  12/08/11 1230 136/70 mmHg 98.1 F (36.7 C) Oral 70  18  94 %   Pre op weight  64 kg Current Weight  12/08/11 152 lb 8.9 oz (69.2 kg)      Intake/Output from previous day: 02/13 0701 - 02/14 0700 In: 480 [P.O.:480] Out: 2002 [Urine:2000; Stool:2]   Physical Exam:  Cardiovascular: RRR, no murmurs, gallops, or rubs. Pulmonary: Clear to auscultation bilaterally; no rales, wheezes, or rhonchi. Abdomen: Soft, non tender, bowel sounds present. Extremities: Mild bilateral lower extremity edema. Wounds: Clean and dry.  No erythema or signs of infection.  Lab Results: CBC:  Basename 12/07/11 0440  WBC 10.0  HGB 8.5*  HCT 24.3*  PLT 129*   BMET:   Basename 12/09/11 0520 12/07/11 0440  NA 144 138  K 4.6 3.8  CL 107 104  CO2 28 26  GLUCOSE 111* 85  BUN 24* 25*  CREATININE 1.19* 1.52*  CALCIUM 9.0 8.2*    PT/INR: No results found for this basename: LABPROT,INR in the last 72 hours ABG:  INR: Will add last result for INR, ABG once components are confirmed Will add last 4 CBG results once components are confirmed  Assessment/Plan:  1. CV - SR. Continue Lopressor 25 bid and Lisinopril 2.5 po daily.  2.  Pulmonary - Encourage incentive spirometer. 3. Volume Overload - Continue with diuresis. 4.  Acute blood loss anemia - H/H 8.5/24.3. Continue  Nu Iron. 5.Thrombocytopenia-Last platelets up 129,000. 6.Creatinine down to 1.19. 8.DM-CBGs 114/114/107 .Pre op HGA1C 7.3.Continue glimiperide as glucose is well controlled. 9.Remove EPW. 10.Discharge in am.   ZIMMERMAN,DONIELLE MPA-C 12/09/2011   patient examined and medical record reviewed,agree with above note. [place] discharge in a.m. 12/09/2011

## 2011-12-09 NOTE — Progress Notes (Addendum)
[id number] Cardiac Rehab Completed discharge education with pt and daughter. She agrees to [facility]. CRP in [place], will send referral. Pt has ambulated with staff twice today using walker, denies any problems. She has rolling walker to use at home.

## 2011-12-09 NOTE — Discharge Summary (Addendum)
[address] City[address]            [place] [address]          [phone]         Discharge Summary  Name: [name] DOB: [birth year] 71 y.o. MRN: [id number]  Admission Date: 12/01/2011 Discharge Date:    Admitting Diagnosis:  Chest pain   Discharge Diagnosis:   NSTEMI  Severe three-vessel coronary artery disease  Hypertension  Hyperlipidemia  Type 2 diabetes mellitus  Postoperative acute blood loss anemia  GERD  History of esophageal stricture  Brief episode postoperative atrial fibrillation   Procedures: Procedure(s): CORONARY ARTERY BYPASS GRAFTING (CABG) x 3 (Left internal mammary artery to the LAD, saphenous vein graft to the obtuse marginal, saphenous vein graft to the right coronary artery), endoscopic vein harvest right leg on  12/04/2011   HPI:  The patient is a 71 y.o. female with a history of intermittent chest pain in the past which has been evaluated by Dr. Rex Kras. She previously underwent a Myoview study in 2008 which was negative for ischemia. Several days prior to this admission she developed chest heaviness with vague shortness of breath, radiating into her neck and associated with nausea but no diaphoresis. Her symptoms resolved spontaneously, but the following day, she developed severe chest pain radiating to her neck and associated with shortness of breath, which prompted her to present to the emergency department for further evaluation. Her initial EKG showed no acute changes heart troponin was positive at 0.4. She was seen by Dr. Ellyn Hack and was admitted for further workup.   Hospital Course:  The patient was admitted to [facility] on 12/01/2011 with the diagnosis of non-ST elevation myocardial infarction. She remained stable and pain-free on Heparin and Nitroglycerin following admission. She underwent cardiac catheterization on 12/02/2011 by Dr. Claiborne Billings which showed severe three-vessel coronary artery disease. This  included a 99% ostial LAD, 80% ostial left circumflex and 40-50% mid right coronary artery. Left ventricular ejection fraction was 50%. A cardiac surgery consult was obtained for consideration of surgical revascularization. Dr. Prescott Gum saw the patient and reviewed her films and agree with the need for CABG. All risks, benefits and alternatives of surgery were explained in detail, and the patient agreed to proceed. The patient was taken to the operating room and underwent the above procedure.    The postoperative course was notable initially for a junctional rhythm. She was not immediately started on a beta blocker for this reason. Her heart rate remained stable, but her blood pressures began to trend upward and she was started on an ACE inhibitor.  After initiation of an ACE inhibitor, her creatinine bumped up to 1.5 but has since trended back down to baseline. Over the course of the next several days, her heart rates did improve and she was able to be started on low-dose beta blocker, which she is tolerating well at this time. She did have an episode of atrial fibrillation and PACs, and her beta blocker dosage was titrated upward accordingly. She is tolerating this dose without problem and is currently maintaining normal sinus rhythm.  She has been mildly volume overloaded postoperatively and has been started on Lasix, to which she is responding well. She remains mildly edematous on physical exam, and her weight is still 45 kg above her preoperative weight.  She initially was maintained on Lantus insulin, but her home  dose of Amaryl was restarted once her by mouth intake improved. Her blood sugars have remained well-controlled on this medication alone, and for this reason, as well as her slightly increased creatinine, Janumet has not been restarted postoperatively. Hopefully this may be resumed as an outpatient.   She has had a mild postoperative blood loss anemia. She did require intraoperative  transfusions of packed red blood cells and postoperatively has been started on iron.  She currently is progressing well. She's tolerating a regular diet. She is ambulating in the halls without difficulty. She has remained afebrile and her vital signs have been stable. We anticipate discharge home within the next 24 hours provided she remains stable.   Recent vital signs:  Filed Vitals:   12/09/11 0452  BP: 157/55  Pulse: 66  Temp: 98.4 F (36.9 C)  Resp: 20    Recent laboratory studies:  CBC: Basename 12/07/11 0440  WBC 10.0  HGB 8.5*  HCT 24.3*  PLT 129*   BMET:  Basename 12/09/11 0520 12/07/11 0440  NA 144 138  K 4.6 3.8  CL 107 104  CO2 28 26  GLUCOSE 111* 85  BUN 24* 25*  CREATININE 1.19* 1.52*  CALCIUM 9.0 8.2*    PT/INR: No results found for this basename: LABPROT,INR in the last 72 hours  Discharge Medications:   Medication List  As of 12/09/2011 10:19 AM   STOP taking these medications          TAKE these medications         acetaminophen 500 MG tablet   Commonly known as: TYLENOL   Take 1,000 mg by mouth every 6 (six) hours as needed. pain      aspirin 325 MG EC tablet   Take 1 tablet (325 mg total) by mouth daily.      CALCIUM PO   Take 1 tablet by mouth daily.      esomeprazole 40 MG capsule   Commonly known as: NEXIUM   Take 40 mg by mouth daily before breakfast.      furosemide 40 MG tablet   Commonly known as: LASIX   Take 1 tablet (40 mg total) by mouth daily for 5 days then stop.      gabapentin 300 MG capsule   Commonly known as: NEURONTIN   Take 1 capsule (300 mg total) by mouth 2 (two) times daily.      glimepiride 4 MG tablet   Commonly known as: AMARYL   Take 4 mg by mouth 2 (two) times daily.      levothyroxine 50 MCG tablet   Commonly known as: SYNTHROID, LEVOTHROID   Take 50 mcg by mouth every other day.      quinipril 10 MG tablet   Commonly known as: Accupril   Take 1 tablet (10 mg total) by mouth at bedtime.       meclizine 25 MG tablet   Commonly known as: ANTIVERT   Take 25 mg by mouth 4 (four) times daily as needed.      metoprolol tartrate 25 MG tablet   Commonly known as: LOPRESSOR   Take 1 tablet (25 mg total) by mouth 3 (three) times daily.      mulitivitamin with minerals Tabs   Take 1 tablet by mouth daily.      polysaccharide iron 150 MG Caps capsule   Commonly known as: NIFEREX   Take 1 capsule (150 mg total) by mouth daily.      rosuvastatin 10 MG  tablet   Commonly known as: CRESTOR   Take 1 tablet (10 mg total) by mouth daily at 6 PM.      traMADol 50 MG tablet   Commonly known as: ULTRAM   Take 1 tablet (50 mg total) by mouth every 6 (six) hours as needed for pain.      VITAMIN B 12 PO   Take 1 tablet by mouth daily.      VITAMIN C PO   Take 1 tablet by mouth daily.      VITAMIN D PO   Take 2 tablets by mouth daily.            sitaGLIPtan-metformin 50-1000 MG per tablet                Commonly known as: Janumet             Take 1 tablet (50-1000 mg total) by mouth two times daily with a meal.    Discharge Instructions:  The patient is to refrain from driving, heavy lifting or strenuous activity.  May shower daily and clean incisions with soap and water.  May resume regular diet.  Discharge Orders    Future Appointments: Provider: Department: Dept Phone: Center:   12/29/2011 11:30 AM Len Childs, MD [facility] [phone] [facility]      Follow-up Information    Schedule an appointment as soon as possible for a visit with Haywood Pao, MD.      Follow up with Leonie Man, MD. Schedule an appointment as soon as possible for a visit in 2 weeks.   Contact information:   [facility] [address], [place] [phone]       Follow up with Len Childs, MD on 12/29/2011. (Have a chest x-ray at 10:30, then see MD at 11:30)    Contact information:   [facility] [place] 12/09/2011, 10:19 AM    patient examined and medical record reviewed,agree with above note. VAN TRIGT III,Jermane Brayboy 12/14/2011

## 2011-12-09 NOTE — Progress Notes (Signed)
Subjective: No chest pain.  Objective: Vital signs in last 24 hours: Temp:  [98.1 F (36.7 C)-98.7 F (37.1 C)] 98.4 F (36.9 C) (02/14 0452) Pulse Rate:  [66-71] 66  (02/14 0452) Resp:  [18-20] 20  (02/14 0452) BP: (136-157)/(50-70) 157/55 mmHg (02/14 0452) SpO2:  [93 %-95 %] 95 % (02/14 0452) Weight change:  Last BM Date: 12/08/11 Intake/Output from previous day: -[place] 02/13 0701 - 02/14 0700 In: 480 [P.O.:480] Out: 2002 [Urine:2000; Stool:2] Intake/Output this shift:    PE: General:A&O X 3, becomes dyspneic  With talking Heart:S1S2 RRR Lungs:few crackles Lt. Base. Decreased BS. Abd:+ BS, soft, non tender [id number] edema    Lab Results:  [facility] 12/07/11 0440  WBC 10.0  HGB 8.5*  HCT 24.3*  PLT 129*   BMET  Basename 12/09/11 0520 12/07/11 0440  NA 144 138  K 4.6 3.8  CL 107 104  CO2 28 26  GLUCOSE 111* 85  BUN 24* 25*  CREATININE 1.19* 1.52*  CALCIUM 9.0 8.2*   No results found for this basename: TROPONINI:2,CK,MB:2 in the last 72 hours  Lab Results  Component Value Date   CHOL 254* 12/02/2011   HDL 36* 12/02/2011   LDLCALC 143* 12/02/2011   TRIG 375* 12/02/2011   CHOLHDL 7.1 12/02/2011   Lab Results  Component Value Date   HGBA1C 7.3* 12/03/2011     Lab Results  Component Value Date   TSH 1.983 12/01/2011     EKG: Orders placed during the hospital encounter of 12/01/11  . EKG 12-LEAD  . EKG  . EKG 12-LEAD  . EKG 12-LEAD    Studies/Results: No results found.  Medications: I have reviewed the patient's current medications.    Marland Kitchen acetaminophen  1,000 mg Oral Q6H   Or  . acetaminophen (TYLENOL) oral liquid 160 mg/5 mL  975 mg Per Tube Q6H  . aspirin EC  325 mg Oral Daily  . bisacodyl  10 mg Oral Daily   Or  . bisacodyl  10 mg Rectal Daily  . docusate sodium  200 mg Oral Daily  . furosemide  40 mg Oral Daily  . gabapentin  300 mg Oral BID  . glimepiride  4 mg Oral BID  . insulin aspart  0-24 Units Subcutaneous TID AC & HS  . lactulose  20  g Oral Once  . levothyroxine  50 mcg Oral Custom  . lisinopril  2.5 mg Oral Daily  . metoprolol tartrate  25 mg Oral BID  . pantoprazole  40 mg Oral QAC breakfast  . polysaccharide iron  150 mg Oral Daily  . rosuvastatin  10 mg Oral q1800  . sodium chloride  3 mL Intravenous Q12H  . DISCONTD: aspirin  324 mg Per Tube Daily  . DISCONTD: potassium chloride  20 mEq Oral Daily   Assessment/Plan: Patient Active Problem List  Diagnoses  . HEMORRHOIDS  . ESOPHAGEAL STRICTURE  . GERD  . CONSTIPATION  . CONSTIPATION, CHRONIC  . SALPINGO-OOPHORITIS  . MENOPAUSE, SURGICAL  . BACK PAIN, LUMBAR  . FLATULENCE-GAS-BLOATING  . ANKLE INJURY, RIGHT  . TOTAL KNEE REPLACEMENT, LEFT, HX OF  . DIABETES MELLITUS-TYPE II  . DYSPHAGIA UNSPECIFIED  . ABDOMINAL PAIN, EPIGASTRIC  . NSTEMI (non-ST elevated myocardial infarction)  . HTN (hypertension)  . Family history of early CAD  . Hyperlipidemia LDL goal <70  . S/P CABG (coronary artery bypass graft), 12/04/11   PLAN:  5 Days Post-Op Procedure(s) (LRB):  CORONARY ARTERY BYPASS GRAFTING (CABG) (N/A)  Freq PACs, also BP elevated.  Previously had junctional rhythm.  Now on Lopressor 25 BID, started yesterday.  Ambulating with rehab.   LOS: 8 days   INGOLD,LAURA R 12/09/2011, 8:57 AM   Patient seen and examined. Agree with assessment and plan. Rhythm reveals sinus with bursts of PAF this am. Will increase lopressor to 25 mg every 8 hrs today.  F/U CXR with decreased BS.  Troy Sine, MD, [facility] 12/09/2011 9:22 AM

## 2011-12-09 NOTE — Progress Notes (Signed)
Nurse call Dr. Tharon Aquas trigt about whether or not he wants a new IV placed in this patient who will be discharged on 2/15 to replace the old one. Dr. Lucianne Lei trigt articulated to the nurse that he would reassess patient in the am and determine whether or not to place an IV at that time. Fara Boros

## 2011-12-10 LAB — GLUCOSE, CAPILLARY: Glucose-Capillary: 153 mg/dL — ABNORMAL HIGH (ref 70–99)

## 2011-12-10 MED ORDER — LISINOPRIL 2.5 MG PO TABS: 10.0000 mg | ORAL_TABLET | Freq: Every day | ORAL | Status: DC

## 2011-12-10 MED ORDER — LISINOPRIL 10 MG PO TABS
10.0000 mg | ORAL_TABLET | Freq: Every day | ORAL | Status: DC
  Administered 2011-12-10: 10 mg via ORAL
  Filled 2011-12-10: qty 1

## 2011-12-10 MED ORDER — SITAGLIPTIN PHOS-METFORMIN HCL 50-1000 MG PO TABS: 1.0000 | ORAL_TABLET | Freq: Two times a day (BID) | ORAL | Status: DC

## 2011-12-10 MED ORDER — POTASSIUM CHLORIDE CRYS ER 20 MEQ PO TBCR
20.0000 meq | EXTENDED_RELEASE_TABLET | Freq: Once | ORAL | Status: AC
  Administered 2011-12-10: 20 meq via ORAL

## 2011-12-10 MED ORDER — LISINOPRIL 10 MG PO TABS: 10.0000 mg | ORAL_TABLET | Freq: Every day | ORAL | Status: DC

## 2011-12-10 MED ORDER — QUINAPRIL HCL 10 MG PO TABS: 10.0000 mg | ORAL_TABLET | Freq: Every day | ORAL | Status: DC

## 2011-12-10 MED ORDER — FUROSEMIDE 40 MG PO TABS: 40.0000 mg | ORAL_TABLET | Freq: Every day | ORAL | Status: DC

## 2011-12-10 NOTE — Progress Notes (Signed)
Subjective:  no chest pain.  Objective: Vital signs in last 24 hours: Temp:  [97.3 F (36.3 C)-97.9 F (36.6 C)] 97.9 F (36.6 C) (02/15 0427) Pulse Rate:  [66-70] 66  (02/15 0427) Resp:  [18] 18  (02/15 0427) BP: (157-167)/(67-77) 167/73 mmHg (02/15 0427) SpO2:  [93 %-98 %] 93 % (02/15 0427) Weight:  [146 lb 9.7 oz (66.5 kg)] 146 lb 9.7 oz (66.5 kg) (02/15 0427) Weight change:  Last BM Date: 12/08/11 Intake/Output from previous day: 02/14 0701 - 02/15 0700 In: 963 [P.O.:960; I.V.:3] Out: 1700 [Urine:1700] Intake/Output this shift:    PE: General:A&O X3 Heart:S1S2, RRR Lungs:clear [id number], but distended, + BS-pt complains of flatulence Ext: no edema.    Lab Results: No results found for this basename: WBC:2,HGB:2,HCT:2,PLT:2 in the last 72 hours BMET  [facility] 12/09/11 0520  NA 144  K 4.6  CL 107  CO2 28  GLUCOSE 111*  BUN 24*  CREATININE 1.19*  CALCIUM 9.0   No results found for this basename: TROPONINI:2,CK,MB:2 in the last 72 hours  Lab Results  Component Value Date   CHOL 254* 12/02/2011   HDL 36* 12/02/2011   LDLCALC 143* 12/02/2011   TRIG 375* 12/02/2011   CHOLHDL 7.1 12/02/2011   Lab Results  Component Value Date   HGBA1C 7.3* 12/03/2011     Lab Results  Component Value Date   TSH 1.983 12/01/2011    Hepatic Function Panel No results found for this basename: PROT,ALBUMIN,AST,ALT,ALKPHOS,BILITOT,BILIDIR,IBILI in the last 72 hours No results found for this basename: CHOL in the last 72 hours No results found for this basename: PROTIME in the last 72 hours    EKG: Orders placed during the hospital encounter of 12/01/11  . EKG 12-LEAD  . EKG  . EKG 12-LEAD  . EKG 12-LEAD    Studies/Results: Dg Chest 2 View  12/09/2011  *RADIOLOGY REPORT*  Clinical Data: 71 year old female status post CABG.  Decreased breath sounds.  CHEST - 2 VIEW  Comparison: 12/07/2011 and earlier.  Findings: Epicardial pacer wires remain in place. Stable cardiomegaly and  mediastinal contours.  Stable sequelae of CABG. No pneumothorax or pulmonary edema.  Moderate left pleural effusion.  Streaky bibasilar atelectasis, but interval improved retrocardiac ventilation. Stable visualized osseous structures.  IMPRESSION: Left pleural effusion and bibasilar atelectasis, but mildly improved basilar ventilation since 12/07/2011.  Original Report Authenticated By: Randall An, M.D.    Medications: I have reviewed the patient's current medications.    Marland Kitchen acetaminophen  1,000 mg Oral Q6H   Or  . acetaminophen (TYLENOL) oral liquid 160 mg/5 mL  975 mg Per Tube Q6H  . aspirin EC  325 mg Oral Daily  . bisacodyl  10 mg Oral Daily   Or  . bisacodyl  10 mg Rectal Daily  . docusate sodium  200 mg Oral Daily  . gabapentin  300 mg Oral BID  . glimepiride  4 mg Oral BID  . insulin aspart  0-24 Units Subcutaneous TID AC & HS  . levothyroxine  50 mcg Oral Custom  . lisinopril  10 mg Oral Daily  . metoprolol tartrate  25 mg Oral TID  . pantoprazole  40 mg Oral QAC breakfast  . polysaccharide iron  150 mg Oral Daily  . potassium chloride  20 mEq Oral Once  . rosuvastatin  10 mg Oral q1800  . sodium chloride  3 mL Intravenous Q12H  . DISCONTD: furosemide  40 mg Oral Daily  . DISCONTD: lisinopril  2.5 mg Oral  Daily  . DISCONTD: metoprolol tartrate  25 mg Oral BID  Assessment/Plan: Patient Active Problem List  Diagnoses  . HEMORRHOIDS  . ESOPHAGEAL STRICTURE  . GERD  . CONSTIPATION  . CONSTIPATION, CHRONIC  . SALPINGO-OOPHORITIS  . MENOPAUSE, SURGICAL  . BACK PAIN, LUMBAR  . FLATULENCE-GAS-BLOATING  . ANKLE INJURY, RIGHT  . TOTAL KNEE REPLACEMENT, LEFT, HX OF  . DIABETES MELLITUS-TYPE II  . DYSPHAGIA UNSPECIFIED  . ABDOMINAL PAIN, EPIGASTRIC  . NSTEMI (non-ST elevated myocardial infarction)  . HTN (hypertension)  . Family history of early CAD  . Hyperlipidemia LDL goal <70  . S/P CABG (coronary artery bypass graft), 12/04/11   PLAN: to be discharged today.  Will  follow up with Dr. Rex Kras. HTN, would increase lopressor to 50mg . BID.  LOS: 9 days   INGOLD,LAURA R 12/10/2011, 8:34 AM Agree with note written by Cecilie Kicks RNP  S/P CABG. For D/C home today. ROV with Dr. Aldona Bar.  Lorretta Harp 12/10/2011 11:03 AM

## 2011-12-10 NOTE — Progress Notes (Signed)
EPW's Discontinued per med order and per hospital protocol.  All ends intact.  Pt reminded to stay in bed for one hour.  PT tolerated procedure well.  Chest Tube sutures discontinued per med order and per hospital protocol.  Steri Strips applied.  Will continue to monitor closely

## 2011-12-10 NOTE — Progress Notes (Signed)
   CARE MANAGEMENT NOTE 12/10/2011  Patient:  [name], [name]   Account Number:  [id number]  Date Initiated:  12/08/2011  Documentation initiated by:  Syndi Pua  Subjective/Objective Assessment:   PT S/P CABG ON 12/05/11.  PTA, PT INDEPENDENT, LIVES ALONE.     Action/Plan:   MET WITH PT AND DAUGHTER, PER REQUEST TO DISCUSS DC PLANNING.   Anticipated DC Date:  12/11/2011   Anticipated DC Plan:  Mobile  CM consult      [facility] Choice  HOME HEALTH   Choice offered to / List presented to:  [name]        [place] arranged  HH-1 RN      [place].   Status of service:  Completed, signed off Medicare Important Message given?   (If response is "NO", the following Medicare IM given date fields will be blank) Date Medicare IM given:   Date Additional Medicare IM given:    Discharge Disposition:  [place]  Per UR Regulation:    Comments:  12/10/11 [name] WITH FAMILY AND HH SERVICES AS ARRANGED. NOTIFIED [place]. Phone [phone]    12/08/11 [name] Mathews,RN,BSN  1500 MET WITH PT AND DAUGHTER TO DISCUSS DC PLANS.  PT TO DC HOME WITH SUPPORT OF CHILDREN AND FRIENDS PROVIDING 24HR CARE.  SHE MAY HAVE A RW AT HOME--DEFINATELY HAS A TUB SEAT AND A BSC.  DAUGHTER IS A NURSE AT [facility] FOLLOW UP. WILL ARRANGE HHRN FOR RESTORATIVE CARE.  DAUGHTER GIVEN LIST OF PRIVATE DUTY SITTERS, PER HER REQUEST.  PT NOT WILLING TO CONSIDER [facility] FOR REHAB, AS SHE HAS HAD A PREVIOUS BAD EXPERIENCE. WILL ARRANGE SERVICES/CONT TO FOLLOW FOR HOME NEEDS. Phone [phone]

## 2011-12-10 NOTE — Progress Notes (Signed)
6 Days Post-Op Procedure(s) (LRB): CORONARY ARTERY BYPASS GRAFTING (CABG) (N/A)  Subjective: Patient with a lot of flatus yesterday.  Objective: Vital signs in last 24 hours: Patient Vitals for the past 24 hrs:  BP Temp Temp src Pulse Resp SpO2 Weight  12/10/11 0427 167/73 mmHg 97.9 F (36.6 C) Oral 66  18  93 % 146 lb 9.7 oz (66.5 kg)  12/09/11 2139 162/71 mmHg - - 67  - - -  12/09/11 1934 158/67 mmHg 97.3 F (36.3 C) Oral 68  18  98 % -  12/09/11 1330 157/77 mmHg 97.5 F (36.4 C) Oral 70  18  96 % -   Pre op weight  64 kg Current Weight  12/10/11 146 lb 9.7 oz (66.5 kg)      Intake/Output from previous day: 02/14 0701 - 02/15 0700 In: 963 [P.O.:960; I.V.:3] Out: 1700 [Urine:1700]   Physical Exam:  Cardiovascular: RRR, no murmurs, gallops, or rubs. Pulmonary: Clear to auscultation bilaterally; no rales, wheezes, or rhonchi. Abdomen: Soft, non tender, bowel sounds present. Extremities: Mild bilateral lower extremity edema. Wounds: Clean and dry.  No erythema or signs of infection.  Lab Results: CBC: No results found for this basename: WBC:2,HGB:2,HCT:2,PLT:2 in the last 72 hours BMET:   [facility] 12/09/11 0520  NA 144  K 4.6  CL 107  CO2 28  GLUCOSE 111*  BUN 24*  CREATININE 1.19*  CALCIUM 9.0    PT/INR: No results found for this basename: LABPROT,INR in the last 72 hours ABG:  INR: Will add last result for INR, ABG once components are confirmed Will add last 4 CBG results once components are confirmed  Assessment/Plan:  1. CV - Brief run of NSVT yesterday.SR since then. Continue Lopressor 25 tid and increase to Lisinopril 10 po daily as SBP remains in 160's(Upon discharge, will resume Quinipril 10 daily) 2.  Pulmonary - Encourage incentive spirometer. 3. Volume Overload - Continue with diuresis. 4.  Acute blood loss anemia - H/H 8.5/24.3. Continue  Nu Iron. 5.Thrombocytopenia-Last platelets up 129,000. 6.Creatinine down to 1.19. 8.DM-CBGs 209/73/153  .Pre op HGA1C 7.3.Continue glimiperide. Restart Janumet as an outpatient. 9.Remove EPW and ct sutures. 10.Discharge today.   Imoni Kohen MPA-C 12/10/2011

## 2011-12-14 NOTE — Discharge Summary (Signed)
patient examined and medical record reviewed,agree with above note. VAN TRIGT III,Lacresia Darwish 12/14/2011

## 2011-12-24 ENCOUNTER — Other Ambulatory Visit: Payer: Self-pay

## 2011-12-24 ENCOUNTER — Emergency Department (HOSPITAL_COMMUNITY): Payer: Medicare Other

## 2011-12-24 ENCOUNTER — Encounter (HOSPITAL_COMMUNITY): Payer: Self-pay | Admitting: *Deleted

## 2011-12-24 ENCOUNTER — Inpatient Hospital Stay (HOSPITAL_COMMUNITY)
Admission: EM | Admit: 2011-12-24 | Discharge: 2011-12-29 | DRG: 291 | Disposition: A | Discharge status: Home / Self Care | Payer: Medicare Other | Attending: Internal Medicine | Admitting: Internal Medicine

## 2011-12-24 DIAGNOSIS — E1149 Type 2 diabetes mellitus with other diabetic neurological complication: Secondary | ICD-10-CM | POA: Diagnosis present

## 2011-12-24 DIAGNOSIS — E039 Hypothyroidism, unspecified: Secondary | ICD-10-CM | POA: Diagnosis present

## 2011-12-24 DIAGNOSIS — E785 Hyperlipidemia, unspecified: Secondary | ICD-10-CM | POA: Diagnosis present

## 2011-12-24 DIAGNOSIS — E875 Hyperkalemia: Secondary | ICD-10-CM | POA: Diagnosis not present

## 2011-12-24 DIAGNOSIS — I4891 Unspecified atrial fibrillation: Secondary | ICD-10-CM | POA: Diagnosis present

## 2011-12-24 DIAGNOSIS — I10 Essential (primary) hypertension: Secondary | ICD-10-CM | POA: Diagnosis present

## 2011-12-24 DIAGNOSIS — E871 Hypo-osmolality and hyponatremia: Secondary | ICD-10-CM | POA: Diagnosis present

## 2011-12-24 DIAGNOSIS — F329 Major depressive disorder, single episode, unspecified: Secondary | ICD-10-CM | POA: Diagnosis present

## 2011-12-24 DIAGNOSIS — I214 Non-ST elevation (NSTEMI) myocardial infarction: Secondary | ICD-10-CM | POA: Diagnosis present

## 2011-12-24 DIAGNOSIS — K219 Gastro-esophageal reflux disease without esophagitis: Secondary | ICD-10-CM | POA: Diagnosis present

## 2011-12-24 DIAGNOSIS — I5033 Acute on chronic diastolic (congestive) heart failure: Principal | ICD-10-CM | POA: Diagnosis present

## 2011-12-24 DIAGNOSIS — M19049 Primary osteoarthritis, unspecified hand: Secondary | ICD-10-CM | POA: Diagnosis present

## 2011-12-24 DIAGNOSIS — Z7982 Long term (current) use of aspirin: Secondary | ICD-10-CM

## 2011-12-24 DIAGNOSIS — E1142 Type 2 diabetes mellitus with diabetic polyneuropathy: Secondary | ICD-10-CM | POA: Diagnosis present

## 2011-12-24 DIAGNOSIS — J189 Pneumonia, unspecified organism: Secondary | ICD-10-CM | POA: Diagnosis present

## 2011-12-24 DIAGNOSIS — Z951 Presence of aortocoronary bypass graft: Secondary | ICD-10-CM

## 2011-12-24 DIAGNOSIS — Z96659 Presence of unspecified artificial knee joint: Secondary | ICD-10-CM

## 2011-12-24 DIAGNOSIS — IMO0001 Reserved for inherently not codable concepts without codable children: Secondary | ICD-10-CM | POA: Diagnosis present

## 2011-12-24 DIAGNOSIS — F3289 Other specified depressive episodes: Secondary | ICD-10-CM | POA: Diagnosis present

## 2011-12-24 DIAGNOSIS — J069 Acute upper respiratory infection, unspecified: Secondary | ICD-10-CM | POA: Diagnosis present

## 2011-12-24 DIAGNOSIS — Z79899 Other long term (current) drug therapy: Secondary | ICD-10-CM

## 2011-12-24 DIAGNOSIS — E119 Type 2 diabetes mellitus without complications: Secondary | ICD-10-CM | POA: Diagnosis present

## 2011-12-24 DIAGNOSIS — Z961 Presence of intraocular lens: Secondary | ICD-10-CM

## 2011-12-24 LAB — GLUCOSE, CAPILLARY
Glucose-Capillary: 168 mg/dL — ABNORMAL HIGH (ref 70–99)
Glucose-Capillary: 183 mg/dL — ABNORMAL HIGH (ref 70–99)

## 2011-12-24 LAB — COMPREHENSIVE METABOLIC PANEL
ALT: 25 U/L (ref 0–35)
AST: 29 U/L (ref 0–37)
Albumin: 3.3 g/dL — ABNORMAL LOW (ref 3.5–5.2)
Alkaline Phosphatase: 160 U/L — ABNORMAL HIGH (ref 39–117)
BUN: 12 mg/dL (ref 6–23)
CO2: 21 mEq/L (ref 19–32)
Calcium: 9.1 mg/dL (ref 8.4–10.5)
Chloride: 100 mEq/L (ref 96–112)
Creatinine, Ser: 0.75 mg/dL (ref 0.50–1.10)
GFR calc Af Amer: >90 mL/min (ref >90)
GFR calc non Af Amer: 83 mL/min — ABNORMAL LOW (ref >90)
Glucose, Bld: 201 mg/dL — ABNORMAL HIGH (ref 70–99)
Potassium: 4.7 mEq/L (ref 3.5–5.1)
Sodium: 132 mEq/L — ABNORMAL LOW (ref 135–145)
Total Bilirubin: 0.5 mg/dL (ref 0.3–1.2)
Total Protein: 7.1 g/dL (ref 6.0–8.3)

## 2011-12-24 LAB — CBC
HCT: 28 % — ABNORMAL LOW (ref 36.0–46.0)
Hemoglobin: 9.6 g/dL — ABNORMAL LOW (ref 12.0–15.0)
MCH: 32 pg (ref 26.0–34.0)
MCHC: 34.3 g/dL (ref 30.0–36.0)
MCV: 93.3 fL (ref 78.0–100.0)
Platelets: 303 10*3/uL (ref 150–400)
RBC: 3 MIL/uL — ABNORMAL LOW (ref 3.87–5.11)
RDW: 13.6 % (ref 11.5–15.5)
WBC: 5.1 10*3/uL (ref 4.0–10.5)

## 2011-12-24 LAB — DIFFERENTIAL
Basophils Absolute: 0 10*3/uL (ref 0.0–0.1)
Basophils Relative: 0 % (ref 0–1)
Eosinophils Absolute: 0 10*3/uL (ref 0.0–0.7)
Eosinophils Relative: 0 % (ref 0–5)
Lymphocytes Relative: 19 % (ref 12–46)
Lymphs Abs: 1 10*3/uL (ref 0.7–4.0)
Monocytes Absolute: 0.6 10*3/uL (ref 0.1–1.0)
Monocytes Relative: 11 % (ref 3–12)
Neutro Abs: 3.6 10*3/uL (ref 1.7–7.7)
Neutrophils Relative %: 70 % (ref 43–77)

## 2011-12-24 LAB — MAGNESIUM: Magnesium: 1.4 mg/dL — ABNORMAL LOW (ref 1.5–2.5)

## 2011-12-24 LAB — TROPONIN I: Troponin I: <0.3 ng/mL (ref <0.30)

## 2011-12-24 MED ORDER — NITROGLYCERIN 0.4 MG SL SUBL: 0.4000 mg | SUBLINGUAL_TABLET | SUBLINGUAL | Status: DC | PRN

## 2011-12-24 MED ORDER — ALPRAZOLAM 0.25 MG PO TABS
0.2500 mg | ORAL_TABLET | Freq: Two times a day (BID) | ORAL | Status: DC | PRN
  Administered 2011-12-24: 0.25 mg via ORAL
  Filled 2011-12-24: qty 1

## 2011-12-24 MED ORDER — INSULIN ASPART 100 UNIT/ML ~~LOC~~ SOLN
0.0000 [IU] | Freq: Three times a day (TID) | SUBCUTANEOUS | Status: DC
  Filled 2011-12-24: qty 3

## 2011-12-24 MED ORDER — ONDANSETRON HCL 4 MG/2ML IJ SOLN: 4.0000 mg | Freq: Four times a day (QID) | INTRAMUSCULAR | Status: DC | PRN

## 2011-12-24 MED ORDER — INSULIN ASPART 100 UNIT/ML ~~LOC~~ SOLN
0.0000 [IU] | Freq: Three times a day (TID) | SUBCUTANEOUS | Status: DC
  Administered 2011-12-24: 2 [IU] via SUBCUTANEOUS
  Administered 2011-12-25 (×2): 3 [IU] via SUBCUTANEOUS
  Administered 2011-12-26: 1 [IU] via SUBCUTANEOUS
  Administered 2011-12-26 (×2): 2 [IU] via SUBCUTANEOUS
  Administered 2011-12-27 – 2011-12-28 (×4): 3 [IU] via SUBCUTANEOUS
  Administered 2011-12-28: 5 [IU] via SUBCUTANEOUS
  Administered 2011-12-28: 3 [IU] via SUBCUTANEOUS
  Administered 2011-12-29: 1 [IU] via SUBCUTANEOUS

## 2011-12-24 MED ORDER — ASPIRIN 325 MG PO TABS: 325.0000 mg | ORAL_TABLET | Freq: Once | ORAL | Status: DC

## 2011-12-24 MED ORDER — LISINOPRIL 10 MG PO TABS
10.0000 mg | ORAL_TABLET | Freq: Every day | ORAL | Status: DC
  Administered 2011-12-25 – 2011-12-26 (×2): 10 mg via ORAL
  Filled 2011-12-24 (×3): qty 1

## 2011-12-24 MED ORDER — ACETAMINOPHEN 325 MG PO TABS: 650.0000 mg | ORAL_TABLET | ORAL | Status: DC | PRN

## 2011-12-24 MED ORDER — ONDANSETRON HCL 4 MG/2ML IJ SOLN
INTRAMUSCULAR | Status: AC
  Administered 2011-12-24: 15:00:00
  Filled 2011-12-24: qty 2

## 2011-12-24 MED ORDER — ASPIRIN 81 MG PO CHEW
CHEWABLE_TABLET | ORAL | Status: AC
  Administered 2011-12-24: 81 mg
  Filled 2011-12-24: qty 4

## 2011-12-24 MED ORDER — POLYSACCHARIDE IRON 150 MG PO CAPS
150.0000 mg | ORAL_CAPSULE | Freq: Every day | ORAL | Status: DC
  Administered 2011-12-24 – 2011-12-29 (×6): 150 mg via ORAL
  Filled 2011-12-24 (×10): qty 1

## 2011-12-24 MED ORDER — HYDRALAZINE HCL 20 MG/ML IJ SOLN
10.0000 mg | Freq: Once | INTRAMUSCULAR | Status: AC
  Administered 2011-12-24: 20 mg via INTRAVENOUS
  Filled 2011-12-24 (×2): qty 0.5

## 2011-12-24 MED ORDER — DEXTROSE 5 % IV SOLN
1.0000 g | INTRAVENOUS | Status: DC
  Administered 2011-12-24 – 2011-12-27 (×4): 1 g via INTRAVENOUS
  Filled 2011-12-24 (×4): qty 10

## 2011-12-24 MED ORDER — HYDRALAZINE HCL 20 MG/ML IJ SOLN
10.0000 mg | Freq: Four times a day (QID) | INTRAMUSCULAR | Status: DC | PRN
  Administered 2011-12-24 – 2011-12-28 (×5): 10 mg via INTRAVENOUS
  Filled 2011-12-24 (×5): qty 1

## 2011-12-24 MED ORDER — SODIUM CHLORIDE 0.9 % IV BOLUS (SEPSIS)
1000.0000 mL | Freq: Once | INTRAVENOUS | Status: AC
Start: 2011-12-24 — End: 2011-12-24
  Administered 2011-12-24: 1000 mL via INTRAVENOUS

## 2011-12-24 MED ORDER — LEVOTHYROXINE SODIUM 50 MCG PO TABS
50.0000 ug | ORAL_TABLET | Freq: Every day | ORAL | Status: DC
  Administered 2011-12-24: 50 ug via ORAL
  Filled 2011-12-24 (×2): qty 1

## 2011-12-24 MED ORDER — LEVOTHYROXINE SODIUM 50 MCG PO TABS
50.0000 ug | ORAL_TABLET | ORAL | Status: DC
  Administered 2011-12-25 – 2011-12-29 (×3): 50 ug via ORAL
  Filled 2011-12-24 (×3): qty 1

## 2011-12-24 MED ORDER — MECLIZINE HCL 25 MG PO TABS
25.0000 mg | ORAL_TABLET | Freq: Four times a day (QID) | ORAL | Status: DC | PRN
  Filled 2011-12-24: qty 1

## 2011-12-24 MED ORDER — AZITHROMYCIN 250 MG PO TABS: 250.0000 mg | ORAL_TABLET | Freq: Every day | ORAL | Status: DC

## 2011-12-24 MED ORDER — ASPIRIN EC 81 MG PO TBEC
324.0000 mg | DELAYED_RELEASE_TABLET | Freq: Every day | ORAL | Status: DC
  Administered 2011-12-25 – 2011-12-29 (×5): 324 mg via ORAL
  Filled 2011-12-24 (×6): qty 4

## 2011-12-24 MED ORDER — LEVALBUTEROL HCL 0.63 MG/3ML IN NEBU
0.6300 mg | INHALATION_SOLUTION | Freq: Three times a day (TID) | RESPIRATORY_TRACT | Status: DC
  Administered 2011-12-24 (×3): 0.63 mg via RESPIRATORY_TRACT
  Filled 2011-12-24 (×3): qty 3

## 2011-12-24 MED ORDER — GABAPENTIN 300 MG PO CAPS
300.0000 mg | ORAL_CAPSULE | Freq: Two times a day (BID) | ORAL | Status: DC
  Administered 2011-12-24 – 2011-12-29 (×11): 300 mg via ORAL
  Filled 2011-12-24 (×11): qty 1

## 2011-12-24 MED ORDER — METOPROLOL TARTRATE 25 MG PO TABS
25.0000 mg | ORAL_TABLET | Freq: Two times a day (BID) | ORAL | Status: DC
  Administered 2011-12-24 – 2011-12-29 (×11): 25 mg via ORAL
  Filled 2011-12-24 (×12): qty 1

## 2011-12-24 MED ORDER — ATORVASTATIN CALCIUM 10 MG PO TABS
20.0000 mg | ORAL_TABLET | Freq: Every day | ORAL | Status: DC
  Administered 2011-12-25 – 2011-12-29 (×5): 20 mg via ORAL
  Filled 2011-12-24 (×5): qty 2

## 2011-12-24 MED ORDER — PANTOPRAZOLE SODIUM 40 MG PO TBEC
40.0000 mg | DELAYED_RELEASE_TABLET | Freq: Every day | ORAL | Status: DC
  Administered 2011-12-24 – 2011-12-29 (×6): 40 mg via ORAL
  Filled 2011-12-24 (×6): qty 1

## 2011-12-24 MED ORDER — ALPRAZOLAM 0.5 MG PO TABS
0.2500 mg | ORAL_TABLET | Freq: Once | ORAL | Status: AC
  Administered 2011-12-24: 0.25 mg via ORAL
  Filled 2011-12-24: qty 2

## 2011-12-24 NOTE — ED Provider Notes (Signed)
History     CSN: [id number]  Arrival date & time 12/24/11  1014   First MD Initiated Contact with Patient 12/24/11 1023      Chief Complaint  Patient presents with  . Chest Pain  . Shortness of Breath     HPI Patient presents with several days of cough, new chest pain and dyspnea.  Notably, the patient had a coronary artery bypass surgery 2 weeks ago.  She notes that immediately following the surgery she was generally well.  She gradually developed a cough approximately 5 days ago.  Since onset the consistent persistent, in spite of azithromycin therapy.  She has had minimal chest discomfort until yesterday.  Last night, approximately 14 hours ago she noticed mild chest discomfort.  This morning the chest pain is more pronounced, sternal, pressure-like/sore.  This is nonradiating discomfort.  She notes increasing dyspnea, including new dyspnea on exertion.  He denies any lightheadedness, vomiting.  She does endorse nausea.  She denies fevers. Past Medical History  Diagnosis Date  . Diabetes mellitus   . Hypertension   . GERD (gastroesophageal reflux disease)   . Neuropathy   . PONV (postoperative nausea and vomiting)   . High cholesterol   . Diabetic neuropathy   . Angina   . Myocardial infarction 12/02/11    "they think I might have had a light heart attack this wk"  . Pneumonia   . Bronchitis   . Hypothyroidism   . History of stomach ulcers 1970's  . Arthritis     "in my hands"  . Depression   . S/P CABG (coronary artery bypass graft), 12/04/11 12/07/2011    Past Surgical History  Procedure Date  . Fracture surgery     "put pins  both side right ankle"  . Total knee arthroplasty ~ 2006    left  . Joint replacement   . Back surgery 2006    "cyst growing near my spine"  . Tonsillectomy 1949  . Abdominal hysterectomy 1980's  . Dilation and curettage of uterus     "a couple times"  . Cesarean section 1977  . Cataract extraction w/ intraocular lens  implant, bilateral ~  2010  . Cardiac catheterization 12/02/11  . Coronary artery bypass graft 12/04/2011    Procedure: CORONARY ARTERY BYPASS GRAFTING (CABG);  Surgeon: Tharon Aquas Adelene Idler, MD;  Location: [facility];  Service: Open Heart Surgery;  Laterality: N/A;  CABG x three,  using left internal mammary artery, and right leg greater saphenous vein harvested endoscopically    History reviewed. No pertinent family history.  History  Substance Use Topics  . Smoking status: Never Smoker   . Smokeless tobacco: Never Used  . Alcohol Use: No    OB History    Grav Para Term Preterm Abortions TAB SAB Ect Mult Living                  Review of Systems  Constitutional:       HPI  HENT:       HPI otherwise negative  Eyes: Negative.   Respiratory:       HPI, otherwise negative  Cardiovascular:       HPI, otherwise nmegative  Gastrointestinal: Negative for vomiting.  Genitourinary:       HPI, otherwise negative  Musculoskeletal:       HPI, otherwise negative  Skin: Negative.   Neurological: Negative for syncope.    Allergies  Epinephrine  Home Medications   Current Outpatient Rx  Name Route Sig Dispense Refill  . ACETAMINOPHEN 500 MG PO TABS Oral Take 1,000 mg by mouth every 6 (six) hours as needed. pain    . VITAMIN C PO Oral Take 1 tablet by mouth daily.    . ASPIRIN 325 MG PO TBEC Oral Take 1 tablet (325 mg total) by mouth daily.    Marland Kitchen CALCIUM PO Oral Take 1 tablet by mouth daily.    Marland Kitchen VITAMIN D PO Oral Take 2 tablets by mouth daily.    Marland Kitchen VITAMIN B 12 PO Oral Take 1 tablet by mouth daily.    Marland Kitchen ESOMEPRAZOLE MAGNESIUM 40 MG PO CPDR Oral Take 40 mg by mouth daily before breakfast.    . FUROSEMIDE 40 MG PO TABS Oral Take 1 tablet (40 mg total) by mouth daily. For 5 days then stop. 7 tablet 0  . GABAPENTIN 300 MG PO CAPS Oral Take 1 capsule (300 mg total) by mouth 2 (two) times daily.    Marland Kitchen GLIMEPIRIDE 4 MG PO TABS Oral Take 4 mg by mouth 2 (two) times daily.    Marland Kitchen LEVOTHYROXINE SODIUM 50 MCG PO TABS  Oral Take 50 mcg by mouth every other day.    Marland Kitchen MECLIZINE HCL 25 MG PO TABS Oral Take 25 mg by mouth 4 (four) times daily as needed.    Marland Kitchen METOPROLOL TARTRATE 25 MG PO TABS Oral Take 1 tablet (25 mg total) by mouth 3 (three) times daily. 90 tablet 1  . ADULT MULTIVITAMIN W/MINERALS CH Oral Take 1 tablet by mouth daily.    Marland Kitchen POLYSACCHARIDE IRON 150 MG PO CAPS Oral Take 1 capsule (150 mg total) by mouth daily. 30 each 0  . QUINAPRIL HCL 10 MG PO TABS Oral Take 1 tablet (10 mg total) by mouth at bedtime. 30 tablet 1  . ROSUVASTATIN CALCIUM 10 MG PO TABS Oral Take 1 tablet (10 mg total) by mouth daily at 6 PM. 30 tablet 1  . SITAGLIPTIN-METFORMIN HCL 50-1000 MG PO TABS Oral Take 1 tablet by mouth 2 (two) times daily with a meal.      BP 187/55  Pulse 64  Temp(Src) 98.4 F (36.9 C) (Oral)  Resp 20  SpO2 100%  Physical Exam  Nursing note and vitals reviewed. Constitutional: She is oriented to person, place, and time. She appears well-developed and well-nourished. No distress.  HENT:  Head: Normocephalic and atraumatic.  Eyes: Conjunctivae and EOM are normal.  Cardiovascular: Normal rate and regular rhythm.   Pulmonary/Chest: Effort normal and breath sounds normal. No stridor. No respiratory distress.       Mid-line sternal scar c/d/i  Abdominal: She exhibits no distension.  Musculoskeletal: She exhibits no edema.  Neurological: She is alert and oriented to person, place, and time. No cranial nerve deficit.  Skin: Skin is warm and dry.  Psychiatric: She has a normal mood and affect.    ED Course  Procedures (including critical care time)   Labs Reviewed  CBC  DIFFERENTIAL  COMPREHENSIVE METABOLIC PANEL  TROPONIN I   No results found.   No diagnosis found.  Cardiac: 79 - afib, abnormal Pulse Ox 99%RA-normal   Date: 12/24/2011  Rate: 67  Rhythm: atrial fibrillation  QRS Axis: right  Intervals: normal  ST/T Wave abnormalities: nonspecific T wave changes  Conduction  Disutrbances:none  Narrative Interpretation:   Old EKG Reviewed: changes noted NEW AFIB - from 12/05/11 ABNORMAL  CXR reviewed by me.   MDM  This 71 year old female with a recent history  of bypass surgery now presents with cough, dyspnea.  On exam she is in no distress, though she is newly in atrial fibrillation.  The patient is neither hypoxic nor in distress.  Patient's labs are unremarkable.  Given Mr. dysrhythmia, ankles proximity to her cardiac surgery, there is concern for this new dysrhythmia.  She was admitted to cardiology for further E/M.          Carmin Muskrat, MD 12/24/11 1520

## 2011-12-24 NOTE — H&P (Signed)
[name] is an 71 y.o. female.   Chief Complaint: SOB HPI:  Patient is a 71 year old Caucasian female was received recent non-ST elevation myocardial infarction February 2013 resulting in coronary artery bypass grafting with a LIMA to the LAD,  Vein graft to the OM, vein graft to the RCA. She reports some postop atrial fibrillation. History also includes hypertension, poorly controlled diabetes, hyperlipidemia, hypothyroidism, depression.  The patient was raised recently seen by Dr. Rex Kras at [facility] or shortness of breath productive cough. When the caregivers at the has been spending time with her apparently was sick and had pneumonia. The patient was started on Z-Pak this past Wednesday for which she's taken 2 days worth of doses. She reports that she has not been able to sleep the last few days she's been rather agitated anxious with nausea diaphoresis, palpitations with cough and chest tightness with cough. She denies lower extremity edema, orthopnea, PND. She reports that because of the nausea she has not been able to take her medications as prescribed.  Her blood pressure in the emergency room is 203/57.  When EKG was completed it showed Sinus rhythm, with PACs, 67 BPM, QTc 504, Twaves inverted in V2 and 3 are new.(computer said Afib) and she is in sinus rhythm on telemetry currently.  She does report her blood sugars have been a little low between 60 and 90.   Past Medical History  Diagnosis Date  . Diabetes mellitus   . Hypertension   . GERD (gastroesophageal reflux disease)   . Neuropathy   . PONV (postoperative nausea and vomiting)   . High cholesterol   . Diabetic neuropathy   . Angina   . Myocardial infarction 12/02/11    "they think I might have had a light heart attack this wk"  . Pneumonia   . Bronchitis   . Hypothyroidism   . History of stomach ulcers 1970's  . Arthritis     "in my hands"  . Depression   . S/P CABG (coronary artery bypass graft),  12/04/11 12/07/2011    Past Surgical History  Procedure Date  . Fracture surgery     "put pins  both side right ankle"  . Total knee arthroplasty ~ 2006    left  . Joint replacement   . Back surgery 2006    "cyst growing near my spine"  . Tonsillectomy 1949  . Abdominal hysterectomy 1980's  . Dilation and curettage of uterus     "a couple times"  . Cesarean section 1977  . Cataract extraction w/ intraocular lens  implant, bilateral ~ 2010  . Cardiac catheterization 12/02/11  . Coronary artery bypass graft 12/04/2011    Procedure: CORONARY ARTERY BYPASS GRAFTING (CABG);  Surgeon: Tharon Aquas Adelene Idler, MD;  Location: [facility];  Service: Open Heart Surgery;  Laterality: N/A;  CABG x three,  using left internal mammary artery, and right leg greater saphenous vein harvested endoscopically    History reviewed. No pertinent family history. Social History:  reports that she has never smoked. She has never used smokeless tobacco. She reports that she does not drink alcohol or use illicit drugs.  Allergies:  Allergies  Allergen Reactions  . Epinephrine Other (See Comments)    Abnormal feeling. Dental exam/injection of local w/ epi.    Medications Prior to Admission  Medication Dose Route Frequency Provider Last Rate Last Dose  . ALPRAZolam Duanne Moron) tablet 0.25 mg  0.25 mg Oral Once Brett Canales, PA      .  aspirin 81 MG chewable tablet        81 mg at 12/24/11 1054  . hydrALAZINE (APRESOLINE) injection 10 mg  10 mg Intravenous Once Brett Canales, PA      . levothyroxine (SYNTHROID, LEVOTHROID) tablet 50 mcg  50 mcg Oral QAC breakfast Brett Canales, PA      . metoprolol tartrate (LOPRESSOR) tablet 25 mg  25 mg Oral BID Brett Canales, PA      . sodium chloride 0.9 % bolus 1,000 mL  1,000 mL Intravenous Once Carmin Muskrat, MD   1,000 mL at 12/24/11 1102  . DISCONTD: aspirin tablet 325 mg  325 mg Oral Once Carmin Muskrat, MD       Medications Prior to Admission  Medication Sig Dispense Refill    . acetaminophen (TYLENOL) 500 MG tablet Take 500 mg by mouth every 6 (six) hours as needed. pain      . Ascorbic Acid (VITAMIN C PO) Take 1 tablet by mouth daily.      Marland Kitchen CALCIUM PO Take 1 tablet by mouth daily.      . Cyanocobalamin (VITAMIN B 12 PO) Take 1 tablet by mouth daily.      Marland Kitchen esomeprazole (NEXIUM) 40 MG capsule Take 40 mg by mouth daily before breakfast.      . gabapentin (NEURONTIN) 300 MG capsule Take 1 capsule (300 mg total) by mouth 2 (two) times daily.      Marland Kitchen glimepiride (AMARYL) 4 MG tablet Take 4 mg by mouth 2 (two) times daily.      Marland Kitchen levothyroxine (SYNTHROID, LEVOTHROID) 50 MCG tablet Take 50 mcg by mouth every other day.      . meclizine (ANTIVERT) 25 MG tablet Take 25 mg by mouth 4 (four) times daily as needed. For dizziness      . Multiple Vitamin (MULITIVITAMIN WITH MINERALS) TABS Take 1 tablet by mouth daily.      . polysaccharide iron (NIFEREX) 150 MG CAPS capsule Take 1 capsule (150 mg total) by mouth daily.  30 each  0  . quinapril (ACCUPRIL) 10 MG tablet Take 1 tablet (10 mg total) by mouth at bedtime.  30 tablet  1  . rosuvastatin (CRESTOR) 10 MG tablet Take 1 tablet (10 mg total) by mouth daily at 6 PM.  30 tablet  1  . sitaGLIPtan-metformin (JANUMET) 50-1000 MG per tablet Take 1 tablet by mouth 2 (two) times daily with a meal.        Results for orders placed during the hospital encounter of 12/24/11 (from the past 48 hour(s))  CBC     Status: Abnormal   Collection Time   12/24/11 10:50 AM      Component Value Range Comment   WBC 5.1  4.0 - 10.5 (K/uL)    RBC 3.00 (*) 3.87 - 5.11 (MIL/uL)    Hemoglobin 9.6 (*) 12.0 - 15.0 (g/dL)    HCT 28.0 (*) 36.0 - 46.0 (%)    MCV 93.3  78.0 - 100.0 (fL)    MCH 32.0  26.0 - 34.0 (pg)    MCHC 34.3  30.0 - 36.0 (g/dL)    RDW 13.6  11.5 - 15.5 (%)    Platelets 303  150 - 400 (K/uL)   DIFFERENTIAL     Status: Normal   Collection Time   12/24/11 10:50 AM      Component Value Range Comment   Neutrophils Relative 70  43 -  77 (%)    Neutro Abs  3.6  1.7 - 7.7 (K/uL)    Lymphocytes Relative 19  12 - 46 (%)    Lymphs Abs 1.0  0.7 - 4.0 (K/uL)    Monocytes Relative 11  3 - 12 (%)    Monocytes Absolute 0.6  0.1 - 1.0 (K/uL)    Eosinophils Relative 0  0 - 5 (%)    Eosinophils Absolute 0.0  0.0 - 0.7 (K/uL)    Basophils Relative 0  0 - 1 (%)    Basophils Absolute 0.0  0.0 - 0.1 (K/uL)   COMPREHENSIVE METABOLIC PANEL     Status: Abnormal   Collection Time   12/24/11 10:50 AM      Component Value Range Comment   Sodium 132 (*) 135 - 145 (mEq/L)    Potassium 4.7  3.5 - 5.1 (mEq/L)    Chloride 100  96 - 112 (mEq/L)    CO2 21  19 - 32 (mEq/L)    Glucose, Bld 201 (*) 70 - 99 (mg/dL)    BUN 12  6 - 23 (mg/dL)    Creatinine, Ser 0.75  0.50 - 1.10 (mg/dL)    Calcium 9.1  8.4 - 10.5 (mg/dL)    Total Protein 7.1  6.0 - 8.3 (g/dL)    Albumin 3.3 (*) 3.5 - 5.2 (g/dL)    AST 29  0 - 37 (U/L)    ALT 25  0 - 35 (U/L)    Alkaline Phosphatase 160 (*) 39 - 117 (U/L)    Total Bilirubin 0.5  0.3 - 1.2 (mg/dL)    GFR calc non Af Amer 83 (*) >90 (mL/min)    GFR calc Af Amer >90  >90 (mL/min)   TROPONIN I     Status: Normal   Collection Time   12/24/11 10:50 AM      Component Value Range Comment   Troponin I <0.30  <0.30 (ng/mL)    Dg Chest 2 View  12/24/2011  *RADIOLOGY REPORT*  Clinical Data: Chest pain, shortness of breath, cough.  CHEST - 2 VIEW  Comparison: 12/09/2011  Findings: Prior CABG.  Cardiomegaly.  Left base atelectasis and small left effusion again noted, similar to prior study.  No confluent opacity or effusion on the right.  No acute bony abnormality.  IMPRESSION: Stable small left effusion and left base atelectasis.  Original Report Authenticated By: Raelyn Number, M.D.    Review of Systems  Constitutional: Positive for diaphoresis. Negative for fever.  HENT: Positive for congestion and sore throat.   Eyes: Negative for blurred vision.  Respiratory: Positive for cough, shortness of breath and wheezing.    Cardiovascular: Positive for palpitations (When coughing). Negative for chest pain, orthopnea, leg swelling and PND.  Gastrointestinal: Positive for nausea. Negative for abdominal pain, diarrhea, blood in stool and melena.  Genitourinary: Negative for dysuria and hematuria.  Neurological: Positive for weakness. Negative for headaches.  All other systems reviewed and are negative.    Blood pressure 203/57, pulse 72, temperature 98.4 F (36.9 C), temperature source Oral, resp. rate 23, SpO2 99.00%. Physical Exam  Constitutional: She is oriented to person, place, and time. She appears well-developed. No distress.       She seems anxious.  HENT:  Head: Normocephalic and atraumatic.  Mouth/Throat: Oropharynx is clear and moist. No oropharyngeal exudate.  Eyes: EOM are normal. Pupils are equal, round, and reactive to light.  Neck: Normal range of motion. Neck supple. No JVD present.  Cardiovascular: Normal rate and regular rhythm.  No murmur heard.      Ectopy  Respiratory: Effort normal. She has wheezes.  GI: Soft. Bowel sounds are normal. She exhibits distension. There is no tenderness.  Musculoskeletal: She exhibits no edema.  Lymphadenopathy:    She has no cervical adenopathy.  Neurological: She is alert and oriented to person, place, and time. She exhibits normal muscle tone.  Skin: Skin is warm and dry.  Psychiatric: Her behavior is normal.       Appears nervous.     Assessment/Plan Patient Active Hospital Problem List: Atrial fibrillation (12/24/2011) Shortness of Breath DIABETES MELLITUS-TYPE II (12/14/2010) HTN (hypertension) (12/01/2011) Hyperlipidemia LDL goal <70 (12/03/2011) S/P CABG (coronary artery bypass graft), 12/04/11 (12/07/2011)  Plan:  For HTN she'll be given 10 mg of hydralazine and her home dose of Lopressor which is 25 mg 3 times a day.  Xopenex treatment.  I ask the patient about her epinephrine allergy and she states she was given epinephrine at the dentist  office approximately 20 years ago and "it made me go up and down and fell terrible."  She did not develop any hives, itching or problems breathing.  It is likely she has an upper respiratory infection.  WBC are not elevated.  I will continue her Z-Pack as prescribed Wednesday.  Hgb has improved from previous labs.  ALKP is elevated at 160.  Part of what she is experiencing may be anxiety related.  She will get Xanax 0.25mg  and see if that helps relax her.   She does have new twave in version in V2-3.     HAGER,BRYAN W 12/24/2011, 2:09 PM  I have seen and examined the patient along with Brett Canales, PA  I have reviewed the chart, notes and new data.  I agree with PA's note.  Key new complaints: Dyspnea is major complaint. Cough produces yellow sputum. No pleurisy Key examination changes: Pallor c/w anemia, flushed cheeks. No diastolic murmurs. No JVD. Key new findings / data: Hgb 9.6, WBC normal. Creat 0.75 and cardiac enzymes normal. There is no real AF on ECG (artifact). ECG T wave changes are minor and could readily be due to resolving postop pericarditis. CXR - left lower lobe "atelectasis" and pleural effusion  PLAN: Broaden antibiotic coverage that was started for respiratory infection as an outpatient. Repeat echo - her very broad pulse pressure is surprising and raises concern for aortic insufficiency or extreme vasodilation (due to early sepsis?). Admit for IV antibiotics and hemodynamic monitoring.  Sanda Klein, MD, [place] [phone] 12/24/2011, 6:24 PM

## 2011-12-24 NOTE — ED Notes (Signed)
Vital signs stable. 

## 2011-12-24 NOTE — ED Notes (Signed)
Pt reports started coughing last night and started developing midsternal chest pain. Woke up this am with sob. Pt had open heart surgery 2 weeks ago.

## 2011-12-24 NOTE — ED Notes (Signed)
Printed two old ekgs from [facility]. 

## 2011-12-24 NOTE — ED Notes (Signed)
Family at bedside. Questions answered.

## 2011-12-24 NOTE — ED Notes (Signed)
RT @ bedside to give xoponex; audible wheezing; pt. Nauseated.[name]

## 2011-12-24 NOTE — ED Notes (Signed)
Family at bedside. 

## 2011-12-25 ENCOUNTER — Other Ambulatory Visit: Payer: Self-pay

## 2011-12-25 LAB — BASIC METABOLIC PANEL
BUN: 15 mg/dL (ref 6–23)
CO2: 21 mEq/L (ref 19–32)
Calcium: 9.1 mg/dL (ref 8.4–10.5)
Chloride: 97 mEq/L (ref 96–112)
Creatinine, Ser: 0.99 mg/dL (ref 0.50–1.10)
GFR calc Af Amer: 65 mL/min — ABNORMAL LOW (ref >90)
GFR calc non Af Amer: 56 mL/min — ABNORMAL LOW (ref >90)
Glucose, Bld: 126 mg/dL — ABNORMAL HIGH (ref 70–99)
Potassium: 4.4 mEq/L (ref 3.5–5.1)
Sodium: 130 mEq/L — ABNORMAL LOW (ref 135–145)

## 2011-12-25 LAB — GLUCOSE, CAPILLARY
Glucose-Capillary: 123 mg/dL — ABNORMAL HIGH (ref 70–99)
Glucose-Capillary: 236 mg/dL — ABNORMAL HIGH (ref 70–99)
Glucose-Capillary: 224 mg/dL — ABNORMAL HIGH (ref 70–99)
Glucose-Capillary: 170 mg/dL — ABNORMAL HIGH (ref 70–99)

## 2011-12-25 LAB — INFLUENZA PANEL BY PCR (TYPE A & B)
H1N1 flu by pcr: NOT DETECTED
Influenza A By PCR: NEGATIVE
Influenza B By PCR: NEGATIVE

## 2011-12-25 LAB — TSH: TSH: 3.415 u[IU]/mL (ref 0.350–4.500)

## 2011-12-25 MED ORDER — METFORMIN HCL 500 MG PO TABS
1000.0000 mg | ORAL_TABLET | Freq: Two times a day (BID) | ORAL | Status: DC
  Administered 2011-12-25 – 2011-12-29 (×9): 1000 mg via ORAL
  Filled 2011-12-25 (×10): qty 2

## 2011-12-25 MED ORDER — GUAIFENESIN-DM 100-10 MG/5ML PO SYRP
5.0000 mL | ORAL_SOLUTION | ORAL | Status: DC | PRN
  Administered 2011-12-25 – 2011-12-26 (×3): 5 mL via ORAL
  Filled 2011-12-25 (×3): qty 5

## 2011-12-25 MED ORDER — MAGNESIUM SULFATE 40 MG/ML IJ SOLN
4.0000 g | Freq: Once | INTRAMUSCULAR | Status: AC
  Administered 2011-12-25: 4 g via INTRAVENOUS
  Filled 2011-12-25: qty 100

## 2011-12-25 MED ORDER — LEVALBUTEROL HCL 0.63 MG/3ML IN NEBU
0.6300 mg | INHALATION_SOLUTION | Freq: Four times a day (QID) | RESPIRATORY_TRACT | Status: DC | PRN
  Administered 2011-12-25 – 2011-12-26 (×4): 0.63 mg via RESPIRATORY_TRACT
  Filled 2011-12-25 (×2): qty 3

## 2011-12-25 MED ORDER — LINAGLIPTIN 5 MG PO TABS
5.0000 mg | ORAL_TABLET | Freq: Every day | ORAL | Status: DC
  Administered 2011-12-25 – 2011-12-29 (×5): 5 mg via ORAL
  Filled 2011-12-25 (×7): qty 1

## 2011-12-25 MED ORDER — FUROSEMIDE 10 MG/ML IJ SOLN
40.0000 mg | Freq: Once | INTRAMUSCULAR | Status: AC
  Administered 2011-12-25: 40 mg via INTRAVENOUS
  Filled 2011-12-25: qty 4

## 2011-12-25 MED ORDER — SITAGLIPTIN PHOS-METFORMIN HCL 50-1000 MG PO TABS: 1.0000 | ORAL_TABLET | Freq: Two times a day (BID) | ORAL | Status: DC

## 2011-12-25 MED ORDER — LEVALBUTEROL HCL 0.63 MG/3ML IN NEBU
0.6300 mg | INHALATION_SOLUTION | Freq: Three times a day (TID) | RESPIRATORY_TRACT | Status: DC | PRN
Start: 2011-12-25 — End: 2011-12-25

## 2011-12-25 NOTE — Progress Notes (Signed)
Received pt asleep in bed, daughter asleep in G/chair at bedside.  No concern noted,we will continue to monitor.

## 2011-12-25 NOTE — Progress Notes (Addendum)
[facility]  DAILY PROGRESS NOTE   Subjective:  Significant cough overnight. She has right lower chest pain with this. X-ray does not appear significantly changed from previous admission.  No elevation in WBC ct. + sputum, says she was exposed to someone with similar respiratory symptoms.  Objective:  Temp:  [98.4 F (36.9 C)-98.9 F (37.2 C)] 98.9 F (37.2 C) (03/02 0800) Pulse Rate:  [54-78] 69  (03/02 0800) Resp:  [15-29] 20  (03/02 0800) BP: (136-222)/(29-121) 151/64 mmHg (03/02 0800) SpO2:  [96 %-100 %] 97 % (03/02 0800) Weight:  [63.2 kg (139 lb 5.3 oz)-64.592 kg (142 lb 6.4 oz)] 64.592 kg (142 lb 6.4 oz) (03/02 0500) Weight change:   Intake/Output from previous day: 03/01 0701 - 03/02 0700 In: 360 [P.O.:360] Out: -   Intake/Output from this shift:    Medications: Current Facility-Administered Medications  Medication Dose Route Frequency Provider Last Rate Last Dose  . acetaminophen (TYLENOL) tablet 650 mg  650 mg Oral Q4H PRN Brett Canales, PA      . ALPRAZolam Duanne Moron) tablet 0.25 mg  0.25 mg Oral Once Brett Canales, PA   0.25 mg at 12/24/11 1613  . ALPRAZolam Duanne Moron) tablet 0.25 mg  0.25 mg Oral BID PRN Brett Canales, PA   0.25 mg at 12/24/11 2131  . aspirin 81 MG chewable tablet        81 mg at 12/24/11 1054  . aspirin EC tablet 324 mg  324 mg Oral Daily Brett Canales, PA      . atorvastatin (LIPITOR) tablet 20 mg  20 mg Oral q1800 Brett Canales, PA      . cefTRIAXone (ROCEPHIN) 1 g in dextrose 5 % 50 mL IVPB  1 g Intravenous Q24H Mihai Croitoru, MD   1 g at 12/24/11 2055  . gabapentin (NEURONTIN) capsule 300 mg  300 mg Oral BID Brett Canales, PA   300 mg at 12/24/11 2112  . hydrALAZINE (APRESOLINE) injection 10 mg  10 mg Intravenous Once Brett Canales, PA   20 mg at 12/24/11 1421  . hydrALAZINE (APRESOLINE) injection 10 mg  10 mg Intravenous Q6H PRN Brett Canales, PA   10 mg at 12/25/11 0733  . insulin aspart (novoLOG) injection 0-9  Units  0-9 Units Subcutaneous TID WC Brett Canales, PA   2 Units at 12/24/11 2113  . levalbuterol (XOPENEX) nebulizer solution 0.63 mg  0.63 mg Nebulization Q6H PRN Leonie Man, MD   0.63 mg at 12/25/11 0734  . levothyroxine (SYNTHROID, LEVOTHROID) tablet 50 mcg  50 mcg Oral QODAY Brett Canales, PA      . lisinopril (PRINIVIL,ZESTRIL) tablet 10 mg  10 mg Oral Daily Brett Canales, PA      . meclizine (ANTIVERT) tablet 25 mg  25 mg Oral QID PRN Brett Canales, PA      . metoprolol tartrate (LOPRESSOR) tablet 25 mg  25 mg Oral BID Brett Canales, PA   25 mg at 12/24/11 2112  . nitroGLYCERIN (NITROSTAT) SL tablet 0.4 mg  0.4 mg Sublingual Q5 Min x 3 PRN Brett Canales, PA      . ondansetron [facility]) 4 MG/2ML injection           . ondansetron (ZOFRAN) injection 4 mg  4 mg Intravenous Q6H PRN Brett Canales, PA      . pantoprazole (PROTONIX) EC tablet 40 mg  40 mg Oral Daily Gaspar Bidding  Willadean Carol, PA   40 mg at 12/24/11 2055  . polysaccharide iron (NIFEREX) capsule 150 mg  150 mg Oral Daily Brett Canales, PA   150 mg at 12/24/11 2031  . sodium chloride 0.9 % bolus 1,000 mL  1,000 mL Intravenous Once Carmin Muskrat, MD   1,000 mL at 12/24/11 1102  . DISCONTD: aspirin tablet 325 mg  325 mg Oral Once Carmin Muskrat, MD      . DISCONTD: azithromycin [facility]) tablet 250 mg  250 mg Oral Daily Brett Canales, PA      . DISCONTD: azithromycin (ZITHROMAX) tablet 250 mg  250 mg Oral Daily Mihai Croitoru, MD      . DISCONTD: insulin aspart (novoLOG) injection 0-9 Units  0-9 Units Subcutaneous TID WC Brett Canales, PA      . DISCONTD: levalbuterol Penne Lash) nebulizer solution 0.63 mg  0.63 mg Nebulization Q8H Brett Canales, PA   0.63 mg at 12/24/11 2159  . DISCONTD: levalbuterol (XOPENEX) nebulizer solution 0.63 mg  0.63 mg Nebulization Q8H PRN Isaiah Serge, NP      . DISCONTD: levothyroxine (SYNTHROID, LEVOTHROID) tablet 50 mcg  50 mcg Oral QAC breakfast Brett Canales, PA   50 mcg at 12/24/11 1424    Physical  Exam: General appearance: alert and no distress Neck: no adenopathy, no carotid bruit, no JVD, supple, symmetrical, trachea midline and thyroid not enlarged, symmetric, no tenderness/mass/nodules Lungs: wheezes RUL, there is an expiratory 2 componenet plural friction rub in the LUL Heart: regular rate and rhythm, S1, S2 normal, no murmur, click, rub or gallop Abdomen: soft, non-tender; bowel sounds normal; no masses,  no organomegaly Extremities: extremities normal, atraumatic, no cyanosis or edema  Lab Results: Results for orders placed during the hospital encounter of 12/24/11 (from the past 48 hour(s))  CBC     Status: Abnormal   Collection Time   12/24/11 10:50 AM      Component Value Range Comment   WBC 5.1  4.0 - 10.5 (K/uL)    RBC 3.00 (*) 3.87 - 5.11 (MIL/uL)    Hemoglobin 9.6 (*) 12.0 - 15.0 (g/dL)    HCT 28.0 (*) 36.0 - 46.0 (%)    MCV 93.3  78.0 - 100.0 (fL)    MCH 32.0  26.0 - 34.0 (pg)    MCHC 34.3  30.0 - 36.0 (g/dL)    RDW 13.6  11.5 - 15.5 (%)    Platelets 303  150 - 400 (K/uL)   DIFFERENTIAL     Status: Normal   Collection Time   12/24/11 10:50 AM      Component Value Range Comment   Neutrophils Relative 70  43 - 77 (%)    Neutro Abs 3.6  1.7 - 7.7 (K/uL)    Lymphocytes Relative 19  12 - 46 (%)    Lymphs Abs 1.0  0.7 - 4.0 (K/uL)    Monocytes Relative 11  3 - 12 (%)    Monocytes Absolute 0.6  0.1 - 1.0 (K/uL)    Eosinophils Relative 0  0 - 5 (%)    Eosinophils Absolute 0.0  0.0 - 0.7 (K/uL)    Basophils Relative 0  0 - 1 (%)    Basophils Absolute 0.0  0.0 - 0.1 (K/uL)   COMPREHENSIVE METABOLIC PANEL     Status: Abnormal   Collection Time   12/24/11 10:50 AM      Component Value Range Comment   Sodium 132 (*) 135 - 145 (  mEq/L)    Potassium 4.7  3.5 - 5.1 (mEq/L)    Chloride 100  96 - 112 (mEq/L)    CO2 21  19 - 32 (mEq/L)    Glucose, Bld 201 (*) 70 - 99 (mg/dL)    BUN 12  6 - 23 (mg/dL)    Creatinine, Ser 0.75  0.50 - 1.10 (mg/dL)    Calcium 9.1  8.4 - 10.5  (mg/dL)    Total Protein 7.1  6.0 - 8.3 (g/dL)    Albumin 3.3 (*) 3.5 - 5.2 (g/dL)    AST 29  0 - 37 (U/L)    ALT 25  0 - 35 (U/L)    Alkaline Phosphatase 160 (*) 39 - 117 (U/L)    Total Bilirubin 0.5  0.3 - 1.2 (mg/dL)    GFR calc non Af Amer 83 (*) >90 (mL/min)    GFR calc Af Amer >90  >90 (mL/min)   TROPONIN I     Status: Normal   Collection Time   12/24/11 10:50 AM      Component Value Range Comment   Troponin I <0.30  <0.30 (ng/mL)   GLUCOSE, CAPILLARY     Status: Abnormal   Collection Time   12/24/11  3:14 PM      Component Value Range Comment   Glucose-Capillary 168 (*) 70 - 99 (mg/dL)   GLUCOSE, CAPILLARY     Status: Abnormal   Collection Time   12/24/11  6:38 PM      Component Value Range Comment   Glucose-Capillary 183 (*) 70 - 99 (mg/dL)    Comment 1 Notify RN     TSH     Status: Normal   Collection Time   12/24/11  7:54 PM      Component Value Range Comment   TSH 3.415  0.350 - 4.500 (uIU/mL)   MAGNESIUM     Status: Abnormal   Collection Time   12/24/11  7:54 PM      Component Value Range Comment   Magnesium 1.4 (*) 1.5 - 2.5 (mg/dL)   BASIC METABOLIC PANEL     Status: Abnormal   Collection Time   12/25/11  7:00 AM      Component Value Range Comment   Sodium 130 (*) 135 - 145 (mEq/L)    Potassium 4.4  3.5 - 5.1 (mEq/L)    Chloride 97  96 - 112 (mEq/L)    CO2 21  19 - 32 (mEq/L)    Glucose, Bld 126 (*) 70 - 99 (mg/dL)    BUN 15  6 - 23 (mg/dL)    Creatinine, Ser 0.99  0.50 - 1.10 (mg/dL)    Calcium 9.1  8.4 - 10.5 (mg/dL)    GFR calc non Af Amer 56 (*) >90 (mL/min)    GFR calc Af Amer 65 (*) >90 (mL/min)   GLUCOSE, CAPILLARY     Status: Abnormal   Collection Time   12/25/11  7:40 AM      Component Value Range Comment   Glucose-Capillary 123 (*) 70 - 99 (mg/dL)    Comment 1 Notify RN       Imaging: Dg Chest 2 View  12/24/2011  *RADIOLOGY REPORT*  Clinical Data: Chest pain, shortness of breath, cough.  CHEST - 2 VIEW  Comparison: 12/09/2011  Findings: Prior CABG.   Cardiomegaly.  Left base atelectasis and small left effusion again noted, similar to prior study.  No confluent opacity or effusion on the right.  No acute  bony abnormality.  IMPRESSION: Stable small left effusion and left base atelectasis.  Original Report Authenticated By: Raelyn Number, M.D.    Assessment:  1. Active Problems: 2.  DIABETES MELLITUS-TYPE II 3.  HTN (hypertension) 4.  Hyperlipidemia LDL goal <70 5.  S/P CABG (coronary artery bypass graft), 12/04/11 6.  Atrial fibrillation 7. Hyponatremia.  Plan:  1. Suspect viral URI. X-ray is nondescript. There is large airway rhonchi and faint expiratory wheezes with cough. Would r/o influenza (she was vaccinated). Now on broader antibiotics, but no clear findings to support pneumonia. Consider discontinuing antibiotics. She is hyponatremic today - may be heart failure or SIADH.  Await 2D echo to review. No specific signs of clinical heart failure. Will add cough suppressant/expectorant. Mg 1.4 - replete today.  Time Spent Directly with Patient:  15 minutes  Length of Stay:  LOS: 1 day   [name], MD, [facility] Attending Cardiologist [facility] 12/25/2011, 10:07 AM

## 2011-12-25 NOTE — Progress Notes (Signed)
  Echocardiogram 2D Echocardiogram has been performed.  [name] 12/25/2011, 4:34 PM

## 2011-12-26 ENCOUNTER — Observation Stay (HOSPITAL_COMMUNITY): Payer: Medicare Other

## 2011-12-26 DIAGNOSIS — I2510 Atherosclerotic heart disease of native coronary artery without angina pectoris: Secondary | ICD-10-CM

## 2011-12-26 DIAGNOSIS — J209 Acute bronchitis, unspecified: Secondary | ICD-10-CM

## 2011-12-26 DIAGNOSIS — J180 Bronchopneumonia, unspecified organism: Secondary | ICD-10-CM

## 2011-12-26 LAB — BLOOD GAS, ARTERIAL
Acid-base deficit: 4.4 mmol/L — ABNORMAL HIGH (ref 0.0–2.0)
Bicarbonate: 18.8 mEq/L — ABNORMAL LOW (ref 20.0–24.0)
Drawn by: 129801
FIO2: 0.21 %
O2 Saturation: 94.6 %
Patient temperature: 98.6
TCO2: 19.6 mmol/L (ref 0–100)
pCO2 arterial: 26.9 mmHg — ABNORMAL LOW (ref 35.0–45.0)
pH, Arterial: 7.458 — ABNORMAL HIGH (ref 7.350–7.400)
pO2, Arterial: 66.2 mmHg — ABNORMAL LOW (ref 80.0–100.0)

## 2011-12-26 LAB — BASIC METABOLIC PANEL
BUN: 24 mg/dL — ABNORMAL HIGH (ref 6–23)
CO2: 21 mEq/L (ref 19–32)
Calcium: 8.9 mg/dL (ref 8.4–10.5)
Chloride: 96 mEq/L (ref 96–112)
Creatinine, Ser: 1.14 mg/dL — ABNORMAL HIGH (ref 0.50–1.10)
GFR calc Af Amer: 54 mL/min — ABNORMAL LOW (ref >90)
GFR calc non Af Amer: 47 mL/min — ABNORMAL LOW (ref >90)
Glucose, Bld: 167 mg/dL — ABNORMAL HIGH (ref 70–99)
Potassium: 4.8 mEq/L (ref 3.5–5.1)
Sodium: 129 mEq/L — ABNORMAL LOW (ref 135–145)

## 2011-12-26 LAB — PRO B NATRIURETIC PEPTIDE: Pro B Natriuretic peptide (BNP): 11936 pg/mL — ABNORMAL HIGH (ref 0–125)

## 2011-12-26 LAB — CBC
HCT: 31 % — ABNORMAL LOW (ref 36.0–46.0)
Hemoglobin: 10.5 g/dL — ABNORMAL LOW (ref 12.0–15.0)
MCH: 32.3 pg (ref 26.0–34.0)
MCHC: 33.9 g/dL (ref 30.0–36.0)
MCV: 95.4 fL (ref 78.0–100.0)
Platelets: 283 10*3/uL (ref 150–400)
RBC: 3.25 MIL/uL — ABNORMAL LOW (ref 3.87–5.11)
RDW: 14.2 % (ref 11.5–15.5)
WBC: 6.7 10*3/uL (ref 4.0–10.5)

## 2011-12-26 LAB — EXPECTORATED SPUTUM ASSESSMENT W GRAM STAIN, RFLX TO RESP C

## 2011-12-26 LAB — DIFFERENTIAL
Basophils Absolute: 0.1 10*3/uL (ref 0.0–0.1)
Basophils Relative: 1 % (ref 0–1)
Eosinophils Absolute: 0 10*3/uL (ref 0.0–0.7)
Eosinophils Relative: 0 % (ref 0–5)
Lymphocytes Relative: 20 % (ref 12–46)
Lymphs Abs: 1.4 10*3/uL (ref 0.7–4.0)
Monocytes Absolute: 0.8 10*3/uL (ref 0.1–1.0)
Monocytes Relative: 12 % (ref 3–12)
Neutro Abs: 4.5 10*3/uL (ref 1.7–7.7)
Neutrophils Relative %: 67 % (ref 43–77)

## 2011-12-26 LAB — OSMOLALITY, URINE: Osmolality, Ur: 401 mOsm/kg (ref 390–1090)

## 2011-12-26 LAB — CREATININE, SERUM
Creatinine, Ser: 1.02 mg/dL (ref 0.50–1.10)
GFR calc Af Amer: 62 mL/min — ABNORMAL LOW (ref >90)
GFR calc non Af Amer: 54 mL/min — ABNORMAL LOW (ref >90)

## 2011-12-26 LAB — GLUCOSE, CAPILLARY
Glucose-Capillary: 167 mg/dL — ABNORMAL HIGH (ref 70–99)
Glucose-Capillary: 188 mg/dL — ABNORMAL HIGH (ref 70–99)
Glucose-Capillary: 140 mg/dL — ABNORMAL HIGH (ref 70–99)
Glucose-Capillary: 204 mg/dL — ABNORMAL HIGH (ref 70–99)

## 2011-12-26 LAB — URINALYSIS, ROUTINE W REFLEX MICROSCOPIC
Bilirubin Urine: NEGATIVE
Glucose, UA: NEGATIVE mg/dL
Hgb urine dipstick: NEGATIVE
Ketones, ur: NEGATIVE mg/dL
Leukocytes, UA: NEGATIVE
Nitrite: NEGATIVE
Protein, ur: 30 mg/dL — AB
Specific Gravity, Urine: 1.013 (ref 1.005–1.030)
Urobilinogen, UA: 0.2 mg/dL (ref 0.0–1.0)
pH: 5.5 (ref 5.0–8.0)

## 2011-12-26 LAB — OSMOLALITY: Osmolality: 274 mOsm/kg — ABNORMAL LOW (ref 275–300)

## 2011-12-26 LAB — URINE MICROSCOPIC-ADD ON

## 2011-12-26 LAB — SODIUM, URINE, RANDOM: Sodium, Ur: 92 mEq/L

## 2011-12-26 LAB — MAGNESIUM: Magnesium: 2.4 mg/dL (ref 1.5–2.5)

## 2011-12-26 MED ORDER — FUROSEMIDE 10 MG/ML IJ SOLN
40.0000 mg | Freq: Once | INTRAMUSCULAR | Status: AC
  Administered 2011-12-26: 40 mg via INTRAVENOUS
  Filled 2011-12-26: qty 4

## 2011-12-26 MED ORDER — DOXYCYCLINE HYCLATE 100 MG PO TABS
100.0000 mg | ORAL_TABLET | Freq: Two times a day (BID) | ORAL | Status: DC
  Administered 2011-12-26 – 2011-12-29 (×6): 100 mg via ORAL
  Filled 2011-12-26 (×7): qty 1

## 2011-12-26 MED ORDER — TIOTROPIUM BROMIDE MONOHYDRATE 18 MCG IN CAPS
18.0000 ug | ORAL_CAPSULE | Freq: Every day | RESPIRATORY_TRACT | Status: DC
  Administered 2011-12-26 – 2011-12-27 (×2): 18 ug via RESPIRATORY_TRACT
  Filled 2011-12-26: qty 5

## 2011-12-26 MED ORDER — METHYLPREDNISOLONE SODIUM SUCC 40 MG IJ SOLR
40.0000 mg | Freq: Two times a day (BID) | INTRAMUSCULAR | Status: DC
  Administered 2011-12-26 – 2011-12-28 (×4): 40 mg via INTRAVENOUS
  Filled 2011-12-26 (×6): qty 1

## 2011-12-26 NOTE — Consult Note (Signed)
PCCM Consult Note  Reason for Consult: Wheezing dyspnea Referring Physician: Dr Hilty/[facility]      Dr Osborne Casco is PCP  [name] is an 71 y.o. female.    HPI: Patient is a 71 year old Caucasian female who received recent non-ST elevation myocardial infarction December 04, 2011 resulting in coronary artery bypass grafting with a LIMA to the LAD, Vein graft to the OM, vein graft to the RCA. She reports some postop atrial fibrillation. History also includes hypertension, poorly controlled diabetes, hyperlipidemia, hypothyroidism, depression. The patient was raised recently seen by Dr. Rex Kras at [facility] heart and vascular or shortness of breath productive cough. Sister- in law/ caregiver  had been spending time with her, apparently was sick with respiratory infection and cough in the home 1 week earlier. The patient was started on Z-Pak this past Wednesday. Took 3 tabs before drug stopped due to QT prolongation concern.  No prior asthma or pneumonia.  Now examined with daughter, RN, here. Some minor cough for several days. Then 4 days ago during night noted onset of sweats, wheeze, dyspnea, tussive right lateral rib pain. Cough productive yellow> brown, no blood. Sinus pressure/ head feels full, aggravating baseline hard of hearing. No nasal discharge or headache. GI upset with initial Zithromax. Denies swelling, other pain, blood, diarrhea. Never smoker with long second hand exposure-husband. Had flu vax this year, Pneumovax 5 y ago Wheeze has not responded well to xopenex neb.  Past Medical History  Diagnosis Date  . Diabetes mellitus   . Hypertension   . GERD (gastroesophageal reflux disease)   . Neuropathy   . PONV (postoperative nausea and vomiting)   . High cholesterol   . Diabetic neuropathy   . Angina   . Myocardial infarction 12/02/11    "they think I might have had a light heart attack  this wk"  . Pneumonia   . Bronchitis   . Hypothyroidism   . History of stomach ulcers 1970's  . Arthritis     "in my hands"  . Depression   . S/P CABG (coronary artery bypass graft), 12/04/11 12/07/2011    Past Surgical History  Procedure Date  . Fracture surgery     "put pins  both side right ankle"  . Total knee arthroplasty ~ 2006    left  . Joint replacement   . Back surgery 2006    "cyst growing near my spine"  . Tonsillectomy 1949  . Abdominal hysterectomy 1980's  . Dilation and curettage of uterus     "a couple times"  . Cesarean section 1977  . Cataract extraction w/ intraocular lens  implant, bilateral ~ 2010  . Cardiac catheterization 12/02/11  . Coronary artery bypass graft 12/04/2011    Procedure: CORONARY ARTERY BYPASS GRAFTING (CABG);  Surgeon: Tharon Aquas Adelene Idler, MD;  Location: [place];  Service: Open Heart Surgery;  Laterality: N/A;  CABG x three,  using left internal mammary artery, and right leg greater saphenous vein harvested endoscopically    History reviewed. No pertinent family history.  Social History:  reports that she has never smoked. She has never used smokeless tobacco. She reports that she does not drink alcohol or use illicit drugs.  Allergies:  Allergies  Allergen Reactions  . Epinephrine Other (See Comments)    Abnormal feeling. Dental exam/injection of local w/ epi.    Medications: Reviewed  Results for orders placed during the hospital encounter of 12/24/11 (from the past 48 hour(s))  GLUCOSE, CAPILLARY     Status:  Abnormal   Collection Time   12/24/11  6:38 PM      Component Value Range Comment   Glucose-Capillary 183 (*) 70 - 99 (mg/dL)    Comment 1 Notify RN     TSH     Status: Normal   Collection Time   12/24/11  7:54 PM      Component Value Range Comment   TSH 3.415  0.350 - 4.500 (uIU/mL)   MAGNESIUM     Status: Abnormal   Collection Time   12/24/11  7:54 PM      Component Value Range Comment   Magnesium 1.4 (*) 1.5 - 2.5 (mg/dL)    BASIC METABOLIC PANEL     Status: Abnormal   Collection Time   12/25/11  7:00 AM      Component Value Range Comment   Sodium 130 (*) 135 - 145 (mEq/L)    Potassium 4.4  3.5 - 5.1 (mEq/L)    Chloride 97  96 - 112 (mEq/L)    CO2 21  19 - 32 (mEq/L)    Glucose, Bld 126 (*) 70 - 99 (mg/dL)    BUN 15  6 - 23 (mg/dL)    Creatinine, Ser 0.99  0.50 - 1.10 (mg/dL)    Calcium 9.1  8.4 - 10.5 (mg/dL)    GFR calc non Af Amer 56 (*) >90 (mL/min)    GFR calc Af Amer 65 (*) >90 (mL/min)   GLUCOSE, CAPILLARY     Status: Abnormal   Collection Time   12/25/11  7:40 AM      Component Value Range Comment   Glucose-Capillary 123 (*) 70 - 99 (mg/dL)    Comment 1 Notify RN     INFLUENZA PANEL BY PCR     Status: Normal   Collection Time   12/25/11 11:24 AM      Component Value Range Comment   Influenza A By PCR NEGATIVE  NEGATIVE     Influenza B By PCR NEGATIVE  NEGATIVE     H1N1 flu by pcr NOT DETECTED  NOT DETECTED    GLUCOSE, CAPILLARY     Status: Abnormal   Collection Time   12/25/11 11:42 AM      Component Value Range Comment   Glucose-Capillary 236 (*) 70 - 99 (mg/dL)    Comment 1 Notify RN     GLUCOSE, CAPILLARY     Status: Abnormal   Collection Time   12/25/11  5:01 PM      Component Value Range Comment   Glucose-Capillary 224 (*) 70 - 99 (mg/dL)    Comment 1 Notify RN     GLUCOSE, CAPILLARY     Status: Abnormal   Collection Time   12/25/11  8:41 PM      Component Value Range Comment   Glucose-Capillary 170 (*) 70 - 99 (mg/dL)   BASIC METABOLIC PANEL     Status: Abnormal   Collection Time   12/26/11  5:10 AM      Component Value Range Comment   Sodium 129 (*) 135 - 145 (mEq/L)    Potassium 4.8  3.5 - 5.1 (mEq/L)    Chloride 96  96 - 112 (mEq/L)    CO2 21  19 - 32 (mEq/L)    Glucose, Bld 167 (*) 70 - 99 (mg/dL)    BUN 24 (*) 6 - 23 (mg/dL)    Creatinine, Ser 1.14 (*) 0.50 - 1.10 (mg/dL)    Calcium 8.9  8.4 - 10.5 (mg/dL)  GFR calc non Af Amer 47 (*) >90 (mL/min)    GFR calc Af Amer 54  (*) >90 (mL/min)   MAGNESIUM     Status: Normal   Collection Time   12/26/11  5:10 AM      Component Value Range Comment   Magnesium 2.4  1.5 - 2.5 (mg/dL)   GLUCOSE, CAPILLARY     Status: Abnormal   Collection Time   12/26/11  7:42 AM      Component Value Range Comment   Glucose-Capillary 167 (*) 70 - 99 (mg/dL)    Comment 1 Notify RN     BLOOD GAS, ARTERIAL     Status: Abnormal   Collection Time   12/26/11 10:00 AM      Component Value Range Comment   FIO2 0.21      pH, Arterial 7.458 (*) 7.350 - 7.400     pCO2 arterial 26.9 (*) 35.0 - 45.0 (mmHg)    pO2, Arterial 66.2 (*) 80.0 - 100.0 (mmHg)    Bicarbonate 18.8 (*) 20.0 - 24.0 (mEq/L)    TCO2 19.6  0 - 100 (mmol/L)    Acid-base deficit 4.4 (*) 0.0 - 2.0 (mmol/L)    O2 Saturation 94.6      Patient temperature 98.6      Collection site BRACHIAL ARTERY      Drawn by [id number]      Sample type ARTERIAL     OSMOLALITY, URINE     Status: Normal   Collection Time   12/26/11 10:26 AM      Component Value Range Comment   Osmolality, Ur 401  390 - 1090 (mOsm/kg)   URINALYSIS, ROUTINE W REFLEX MICROSCOPIC     Status: Abnormal   Collection Time   12/26/11 10:26 AM      Component Value Range Comment   Color, Urine YELLOW  YELLOW     APPearance CLEAR  CLEAR     Specific Gravity, Urine 1.013  1.005 - 1.030     pH 5.5  5.0 - 8.0     Glucose, UA NEGATIVE  NEGATIVE (mg/dL)    Hgb urine dipstick NEGATIVE  NEGATIVE     Bilirubin Urine NEGATIVE  NEGATIVE     Ketones, ur NEGATIVE  NEGATIVE (mg/dL)    Protein, ur 30 (*) NEGATIVE (mg/dL)    Urobilinogen, UA 0.2  0.0 - 1.0 (mg/dL)    Nitrite NEGATIVE  NEGATIVE     Leukocytes, UA NEGATIVE  NEGATIVE    SODIUM, URINE, RANDOM     Status: Normal   Collection Time   12/26/11 10:26 AM      Component Value Range Comment   Sodium, Ur 92     URINE MICROSCOPIC-ADD ON     Status: Abnormal   Collection Time   12/26/11 10:26 AM      Component Value Range Comment   Squamous Epithelial / LPF RARE  RARE     Casts  HYALINE CASTS (*) NEGATIVE    PRO B NATRIURETIC PEPTIDE     Status: Abnormal   Collection Time   12/26/11 10:36 AM      Component Value Range Comment   Pro B Natriuretic peptide (BNP) 11936.0 (*) 0 - 125 (pg/mL)   OSMOLALITY     Status: Abnormal   Collection Time   12/26/11 10:40 AM      Component Value Range Comment   Osmolality 274 (*) 275 - 300 (mOsm/kg)   CREATININE, SERUM     Status: Abnormal  Collection Time   12/26/11 10:40 AM      Component Value Range Comment   Creatinine, Ser 1.02  0.50 - 1.10 (mg/dL)    GFR calc non Af Amer 54 (*) >90 (mL/min)    GFR calc Af Amer 62 (*) >90 (mL/min)   GLUCOSE, CAPILLARY     Status: Abnormal   Collection Time   12/26/11 11:31 AM      Component Value Range Comment   Glucose-Capillary 188 (*) 70 - 99 (mg/dL)    Comment 1 Notify RN       Dg Chest 1 View  12/26/2011  *RADIOLOGY REPORT*  Clinical Data: Dyspnea  CHEST - 1 VIEW  Comparison: 12/24/2011  Findings: Previous CABG.  Stable cardiomegaly.  Left perihilar and basilar interstitial and airspace opacities have slightly increased.  Right lung clear.  No definite effusion.  IMPRESSION:  1.  Slight worsening of left lower lobe infiltrate, possibly pneumonia. 2.  Stable cardiomegaly.  Original Report Authenticated By: Trecia Rogers, M.D.  I reviewed image- technically limited with sternotomy, Left lower density/ effusion/ pneumonia.  ROS-see HPI Constitutional:   No-   weight loss,  +night sweats, +fevers, No-chills, fatigue, lassitude. HEENT:   No-  headaches, difficulty swallowing, tooth/dental problems, sore throat,       No-  sneezing, itching, ear ache, +nasal congestion, No-post nasal drip, + hard of hearing CV:  + chest pain,  No-orthopnea, PND, swelling in lower extremities, anasarca,  dizziness, palpitations Resp: +   shortness of breath with exertion or at rest.              +  productive cough,  No non-productive cough,  No- coughing up of blood.              +   change in color  of mucus.  +- wheezing.   Skin: No-   rash or lesions. GI:  No-   heartburn, indigestion, abdominal pain,  vomiting, diarrhea,                 change in bowel habits, loss of appetite.  + transient nausea after Zithromax GU: No-   dysuria, change in color of urine, no urgency or frequency.  No- flank pain. MS:  No-   joint pain or swelling.  Marland Kitchen  No- back pain. Neuro-     nothing unusual Psych:  No- change in mood or affect. No depression or anxiety.  No memory loss.   PHYS EXAM: Blood pressure 162/68, pulse 59, temperature 99.4 F (37.4 C), temperature source Oral, resp. rate 18, height 5\' 1"  (1.549 m), weight 66 kg (145 lb 8.1 oz), SpO2 100.00%. OBJ- Physical Exam General- Alert, Oriented, Affect-appropriate, Distress- none acute Skin- rash-none, lesions- none, excoriation- none Lymphadenopathy- none Head- atraumatic            Eyes- Gross vision intact, PERRLA, conjunctivae and secretions clear            Ears- Hard of hearing            Nose- Clear, no-Septal dev, mucus,             Throat- Mallampati II , mucosa clear , drainage- none, tonsils- atrophic Neck- flexible , trachea midline, no stridor , thyroid nl, carotid no bruit Chest - symmetrical excursion , unlabored           Heart/CV- RRR , no murmur , no gallop  , no rub, nl s1 s2                           -  JVD- none , edema- none, stasis changes- none, varices- none           Lung-  Loud inspiratory> expiratory wheeze           Chest wall- healing sternotomy scar Abd- tender-no, distended-no, bowel sounds-present, HSM- no Br/ Gen/ Rectal- Not done, not indicated Extrem- cyanosis- none, clubbing, none, atrophy- none, strength- nl Neuro- grossly intact to observation     Assessment/Plan: 1) Acute bronchitis with asthma. Probable community acquired pneumonia L base. Exposure to family member w/ cough more likely source than her recent hosp for CABG, and therefore this was likely initially viral.  -Plan Update CXR, CBC.  Give doxycycline, continue rocephin -Agree with supplemental O2. Will add Spiriva, solumedrol. Sputum Cx -We may be asking you to stop ACEI lisinopril as potential contributor to upper airway edema. 2) Pleural effusion- pre-existing and probably cardiogenic. She sleeps left decubitus. Doubt Dressler's at this point.  YOUNG,CLINTON D 12/26/2011, 3:55 PM

## 2011-12-26 NOTE — Progress Notes (Signed)
ABG's and CXR reviewed and discussed with Dr. Sallyanne Kuster.  Will ask Pulmonary to consult with worsening PNA, He asks to avoid levaquin and azithro due to long QT.

## 2011-12-26 NOTE — Progress Notes (Signed)
[facility]  DAILY PROGRESS NOTE   Subjective:  Appears more dyspneic today. Echo shows preserved LVEF of 65%, but Stage 2 diastolic dysfunction with elevated LV filling pressures. Did urinate overnight, however, creatinine is worse today and she is more hyponatremic.  She also feels like her head is congested.  Objective:  Temp:  [97.8 F (36.6 C)-99.7 F (37.6 C)] 99.7 F (37.6 C) (03/03 0800) Pulse Rate:  [55-80] 63  (03/03 0800) Resp:  [18] 18  (03/03 0800) BP: (136-194)/(55-80) 194/70 mmHg (03/03 0800) SpO2:  [95 %-97 %] 96 % (03/03 0800) Weight:  [66 kg (145 lb 8.1 oz)] 66 kg (145 lb 8.1 oz) (03/03 0500) Weight change: 2.8 kg (6 lb 2.8 oz)  Intake/Output from previous day: 03/02 0701 - 03/03 0700 In: 340 [P.O.:240; IV Piggyback:100] Out: -   Intake/Output from this shift: Total I/O In: -  Out: 75 [Urine:75]  Medications: Current Facility-Administered Medications  Medication Dose Route Frequency Provider Last Rate Last Dose  . acetaminophen (TYLENOL) tablet 650 mg  650 mg Oral Q4H PRN Brett Canales, PA      . ALPRAZolam Duanne Moron) tablet 0.25 mg  0.25 mg Oral BID PRN Brett Canales, PA   0.25 mg at 12/24/11 2131  . aspirin EC tablet 324 mg  324 mg Oral Daily Brett Canales, PA   324 mg at 12/25/11 1055  . atorvastatin (LIPITOR) tablet 20 mg  20 mg Oral q1800 Brett Canales, PA   20 mg at 12/25/11 1721  . cefTRIAXone (ROCEPHIN) 1 g in dextrose 5 % 50 mL IVPB  1 g Intravenous Q24H Mihai Croitoru, MD   1 g at 12/25/11 2022  . furosemide (LASIX) injection 40 mg  40 mg Intravenous Once Isaiah Serge, NP   40 mg at 12/25/11 1859  . gabapentin (NEURONTIN) capsule 300 mg  300 mg Oral BID Brett Canales, PA   300 mg at 12/25/11 2203  . guaiFENesin-dextromethorphan (ROBITUSSIN DM) 100-10 MG/5ML syrup 5 mL  5 mL Oral Q4H PRN [name], MD   5 mL at 12/25/11 2033  . hydrALAZINE (APRESOLINE) injection 10 mg  10 mg Intravenous Q6H PRN Brett Canales, PA    10 mg at 12/26/11 0841  . insulin aspart (novoLOG) injection 0-9 Units  0-9 Units Subcutaneous TID WC Brett Canales, PA   2 Units at 12/26/11 [phone]  . levalbuterol (XOPENEX) nebulizer solution 0.63 mg  0.63 mg Nebulization Q6H PRN Leonie Man, MD   0.63 mg at 12/25/11 1658  . levothyroxine (SYNTHROID, LEVOTHROID) tablet 50 mcg  50 mcg Oral QODAY Brett Canales, PA   50 mcg at 12/25/11 1100  . linagliptin (TRADJENTA) tablet 5 mg  5 mg Oral Daily [name], MD   5 mg at 12/25/11 1251  . lisinopril (PRINIVIL,ZESTRIL) tablet 10 mg  10 mg Oral Daily Brett Canales, PA   10 mg at 12/25/11 1101  . magnesium sulfate IVPB 4 g 100 mL  4 g Intravenous Once [name], MD   4 g at 12/25/11 1111  . meclizine (ANTIVERT) tablet 25 mg  25 mg Oral QID PRN Brett Canales, PA      . metFORMIN (GLUCOPHAGE) tablet 1,000 mg  1,000 mg Oral BID WC [name], MD   1,000 mg at 12/26/11 0831  . metoprolol tartrate (LOPRESSOR) tablet 25 mg  25 mg Oral BID Brett Canales, PA  25 mg at 12/25/11 2203  . nitroGLYCERIN (NITROSTAT) SL tablet 0.4 mg  0.4 mg Sublingual Q5 Min x 3 PRN Brett Canales, PA      . ondansetron [facility]) injection 4 mg  4 mg Intravenous Q6H PRN Brett Canales, PA      . pantoprazole (PROTONIX) EC tablet 40 mg  40 mg Oral Daily Brett Canales, PA   40 mg at 12/25/11 1051  . polysaccharide iron (NIFEREX) capsule 150 mg  150 mg Oral Daily Brett Canales, PA   150 mg at 12/25/11 1059  . DISCONTD: sitaGLIPtan-metformin (JANUMET) 50-1000 MG per tablet 1 tablet  1 tablet Oral BID WC Isaiah Serge, NP        Physical Exam: General appearance: alert and mild distress Neck: JVD - 2 cm above sternal notch, no adenopathy, no carotid bruit, supple, symmetrical, trachea midline and thyroid not enlarged, symmetric, no tenderness/mass/nodules Lungs: wheezes bilaterally Heart: regular rate and rhythm, S1, S2 normal, no murmur, click, rub or gallop Abdomen: soft, non-tender; bowel sounds normal; no  masses,  no organomegaly Extremities: trace edema  Lab Results: Results for orders placed during the hospital encounter of 12/24/11 (from the past 48 hour(s))  CBC     Status: Abnormal   Collection Time   12/24/11 10:50 AM      Component Value Range Comment   WBC 5.1  4.0 - 10.5 (K/uL)    RBC 3.00 (*) 3.87 - 5.11 (MIL/uL)    Hemoglobin 9.6 (*) 12.0 - 15.0 (g/dL)    HCT 28.0 (*) 36.0 - 46.0 (%)    MCV 93.3  78.0 - 100.0 (fL)    MCH 32.0  26.0 - 34.0 (pg)    MCHC 34.3  30.0 - 36.0 (g/dL)    RDW 13.6  11.5 - 15.5 (%)    Platelets 303  150 - 400 (K/uL)   DIFFERENTIAL     Status: Normal   Collection Time   12/24/11 10:50 AM      Component Value Range Comment   Neutrophils Relative 70  43 - 77 (%)    Neutro Abs 3.6  1.7 - 7.7 (K/uL)    Lymphocytes Relative 19  12 - 46 (%)    Lymphs Abs 1.0  0.7 - 4.0 (K/uL)    Monocytes Relative 11  3 - 12 (%)    Monocytes Absolute 0.6  0.1 - 1.0 (K/uL)    Eosinophils Relative 0  0 - 5 (%)    Eosinophils Absolute 0.0  0.0 - 0.7 (K/uL)    Basophils Relative 0  0 - 1 (%)    Basophils Absolute 0.0  0.0 - 0.1 (K/uL)   COMPREHENSIVE METABOLIC PANEL     Status: Abnormal   Collection Time   12/24/11 10:50 AM      Component Value Range Comment   Sodium 132 (*) 135 - 145 (mEq/L)    Potassium 4.7  3.5 - 5.1 (mEq/L)    Chloride 100  96 - 112 (mEq/L)    CO2 21  19 - 32 (mEq/L)    Glucose, Bld 201 (*) 70 - 99 (mg/dL)    BUN 12  6 - 23 (mg/dL)    Creatinine, Ser 0.75  0.50 - 1.10 (mg/dL)    Calcium 9.1  8.4 - 10.5 (mg/dL)    Total Protein 7.1  6.0 - 8.3 (g/dL)    Albumin 3.3 (*) 3.5 - 5.2 (g/dL)    AST 29  0 - 37 (  U/L)    ALT 25  0 - 35 (U/L)    Alkaline Phosphatase 160 (*) 39 - 117 (U/L)    Total Bilirubin 0.5  0.3 - 1.2 (mg/dL)    GFR calc non Af Amer 83 (*) >90 (mL/min)    GFR calc Af Amer >90  >90 (mL/min)   TROPONIN I     Status: Normal   Collection Time   12/24/11 10:50 AM      Component Value Range Comment   Troponin I <0.30  <0.30 (ng/mL)     GLUCOSE, CAPILLARY     Status: Abnormal   Collection Time   12/24/11  3:14 PM      Component Value Range Comment   Glucose-Capillary 168 (*) 70 - 99 (mg/dL)   GLUCOSE, CAPILLARY     Status: Abnormal   Collection Time   12/24/11  6:38 PM      Component Value Range Comment   Glucose-Capillary 183 (*) 70 - 99 (mg/dL)    Comment 1 Notify RN     TSH     Status: Normal   Collection Time   12/24/11  7:54 PM      Component Value Range Comment   TSH 3.415  0.350 - 4.500 (uIU/mL)   MAGNESIUM     Status: Abnormal   Collection Time   12/24/11  7:54 PM      Component Value Range Comment   Magnesium 1.4 (*) 1.5 - 2.5 (mg/dL)   BASIC METABOLIC PANEL     Status: Abnormal   Collection Time   12/25/11  7:00 AM      Component Value Range Comment   Sodium 130 (*) 135 - 145 (mEq/L)    Potassium 4.4  3.5 - 5.1 (mEq/L)    Chloride 97  96 - 112 (mEq/L)    CO2 21  19 - 32 (mEq/L)    Glucose, Bld 126 (*) 70 - 99 (mg/dL)    BUN 15  6 - 23 (mg/dL)    Creatinine, Ser 0.99  0.50 - 1.10 (mg/dL)    Calcium 9.1  8.4 - 10.5 (mg/dL)    GFR calc non Af Amer 56 (*) >90 (mL/min)    GFR calc Af Amer 65 (*) >90 (mL/min)   GLUCOSE, CAPILLARY     Status: Abnormal   Collection Time   12/25/11  7:40 AM      Component Value Range Comment   Glucose-Capillary 123 (*) 70 - 99 (mg/dL)    Comment 1 Notify RN     INFLUENZA PANEL BY PCR     Status: Normal   Collection Time   12/25/11 11:24 AM      Component Value Range Comment   Influenza A By PCR NEGATIVE  NEGATIVE     Influenza B By PCR NEGATIVE  NEGATIVE     H1N1 flu by pcr NOT DETECTED  NOT DETECTED    GLUCOSE, CAPILLARY     Status: Abnormal   Collection Time   12/25/11 11:42 AM      Component Value Range Comment   Glucose-Capillary 236 (*) 70 - 99 (mg/dL)    Comment 1 Notify RN     GLUCOSE, CAPILLARY     Status: Abnormal   Collection Time   12/25/11  5:01 PM      Component Value Range Comment   Glucose-Capillary 224 (*) 70 - 99 (mg/dL)    Comment 1 Notify RN      GLUCOSE, CAPILLARY  Status: Abnormal   Collection Time   12/25/11  8:41 PM      Component Value Range Comment   Glucose-Capillary 170 (*) 70 - 99 (mg/dL)   BASIC METABOLIC PANEL     Status: Abnormal   Collection Time   12/26/11  5:10 AM      Component Value Range Comment   Sodium 129 (*) 135 - 145 (mEq/L)    Potassium 4.8  3.5 - 5.1 (mEq/L)    Chloride 96  96 - 112 (mEq/L)    CO2 21  19 - 32 (mEq/L)    Glucose, Bld 167 (*) 70 - 99 (mg/dL)    BUN 24 (*) 6 - 23 (mg/dL)    Creatinine, Ser 1.14 (*) 0.50 - 1.10 (mg/dL)    Calcium 8.9  8.4 - 10.5 (mg/dL)    GFR calc non Af Amer 47 (*) >90 (mL/min)    GFR calc Af Amer 54 (*) >90 (mL/min)   MAGNESIUM     Status: Normal   Collection Time   12/26/11  5:10 AM      Component Value Range Comment   Magnesium 2.4  1.5 - 2.5 (mg/dL)   GLUCOSE, CAPILLARY     Status: Abnormal   Collection Time   12/26/11  7:42 AM      Component Value Range Comment   Glucose-Capillary 167 (*) 70 - 99 (mg/dL)    Comment 1 Notify RN       Imaging: Dg Chest 2 View  12/24/2011  *RADIOLOGY REPORT*  Clinical Data: Chest pain, shortness of breath, cough.  CHEST - 2 VIEW  Comparison: 12/09/2011  Findings: Prior CABG.  Cardiomegaly.  Left base atelectasis and small left effusion again noted, similar to prior study.  No confluent opacity or effusion on the right.  No acute bony abnormality.  IMPRESSION: Stable small left effusion and left base atelectasis.  Original Report Authenticated By: Raelyn Number, M.D.    Assessment:  1. Active Problems: 2.  DIABETES MELLITUS-TYPE II 3.  HTN (hypertension) 4.  Hyperlipidemia LDL goal <70 5.  S/P CABG (coronary artery bypass graft), 12/04/11 6.  Atrial fibrillation 7.   Plan:  1. Increase filling pressure on echo .Marland Kitchen Got lasix yesterday, more wheezy today, appears dyspneic. Renal funtion is worse and sodium is lower today.  Diffuse wheezes and basilar rales. Will check ABG - chest x-ray today. Add BNP to labs. Will check  urinalysis. ? Why her head feels congested.  I would watch her closely today, I'm worried about acute decompensation .Marland Kitchen She seems more restless and it is more difficult for her to breathe.  Time Spent Directly with Patient:  30 minutes  Length of Stay:  LOS: 2 days   [name], MD, [facility] Attending Cardiologist [facility] 12/26/2011, 9:52 AM

## 2011-12-27 DIAGNOSIS — R05 Cough: Secondary | ICD-10-CM

## 2011-12-27 DIAGNOSIS — R059 Cough, unspecified: Secondary | ICD-10-CM

## 2011-12-27 DIAGNOSIS — J069 Acute upper respiratory infection, unspecified: Secondary | ICD-10-CM | POA: Diagnosis present

## 2011-12-27 LAB — GLUCOSE, CAPILLARY
Glucose-Capillary: 209 mg/dL — ABNORMAL HIGH (ref 70–99)
Glucose-Capillary: 222 mg/dL — ABNORMAL HIGH (ref 70–99)
Glucose-Capillary: 209 mg/dL — ABNORMAL HIGH (ref 70–99)
Glucose-Capillary: 199 mg/dL — ABNORMAL HIGH (ref 70–99)

## 2011-12-27 MED ORDER — FUROSEMIDE 10 MG/ML IJ SOLN
40.0000 mg | Freq: Once | INTRAMUSCULAR | Status: AC
  Administered 2011-12-27: 40 mg via INTRAVENOUS
  Filled 2011-12-27: qty 4

## 2011-12-27 MED ORDER — LOSARTAN POTASSIUM 50 MG PO TABS
50.0000 mg | ORAL_TABLET | Freq: Every day | ORAL | Status: DC
  Administered 2011-12-27 – 2011-12-28 (×2): 50 mg via ORAL
  Filled 2011-12-27 (×3): qty 1

## 2011-12-27 MED ORDER — HYDROCHLOROTHIAZIDE 12.5 MG PO CAPS
12.5000 mg | ORAL_CAPSULE | Freq: Every day | ORAL | Status: DC
  Filled 2011-12-27: qty 1

## 2011-12-27 NOTE — Progress Notes (Signed)
Subjective:  She says she feels better but she didn't get up yesterday at all. She still has a significant cough, (wet sounding)  Objective:  Vital Signs in the last 24 hours: Temp:  [98.3 F (36.8 C)-99.4 F (37.4 C)] 98.3 F (36.8 C) (03/04 0500) Pulse Rate:  [56-82] 63  (03/04 0936) Resp:  [18-20] 18  (03/04 0500) BP: (140-175)/(50-72) 175/72 mmHg (03/04 0936) SpO2:  [98 %-100 %] 98 % (03/04 0500) Weight:  [66 kg (145 lb 8.1 oz)] 66 kg (145 lb 8.1 oz) (03/04 0500)  Intake/Output from previous day:  Intake/Output Summary (Last 24 hours) at 12/27/11 1119 Last data filed at 12/26/11 2200  Gross per 24 hour  Intake     80 ml  Output    680 ml  Net   -600 ml    Physical Exam: General appearance: alert, cooperative and no distress Lungs: decreased breath sounds Lt base Heart: regular rate and rhythm   Rate: 66  Rhythm: normal sinus rhythm  Lab Results:  Basename 12/26/11 1628  WBC 6.7  HGB 10.5*  PLT 283    Basename 12/26/11 1040 12/26/11 0510 12/25/11 0700  NA -- 129* 130*  K -- 4.8 4.4  CL -- 96 97  CO2 -- 21 21  GLUCOSE -- 167* 126*  BUN -- 24* 15  CREATININE 1.02 1.14* --   No results found for this basename: TROPONINI:2,CK,MB:2 in the last 72 hours Hepatic Function Panel No results found for this basename: PROT,ALBUMIN,AST,ALT,ALKPHOS,BILITOT,BILIDIR,IBILI in the last 72 hours No results found for this basename: CHOL in the last 72 hours No results found for this basename: INR in the last 72 hours  Imaging: Dg Chest 1 View  12/26/2011  *RADIOLOGY REPORT*  Clinical Data: Dyspnea  CHEST - 1 VIEW  Comparison: 12/24/2011  Findings: Previous CABG.  Stable cardiomegaly.  Left perihilar and basilar interstitial and airspace opacities have slightly increased.  Right lung clear.  No definite effusion.  IMPRESSION:  1.  Slight worsening of left lower lobe infiltrate, possibly pneumonia. 2.  Stable cardiomegaly.  Original Report Authenticated By: Trecia Rogers,  M.D.   Dg Chest 2 View  12/26/2011  *RADIOLOGY REPORT*  Clinical Data: Pneumonia  CHEST - 2 VIEW  Comparison: Chest radiograph 12/26/2011  Findings: Sternotomy wires overlie mildly enlarged heart silhouette.  Mild left basilar atelectasis is similar to prior. Central bronchitic change is similar.  There is small bilateral pleural effusions.  No evidence of pulmonary edema.  No evidence of pneumonia.  IMPRESSION:  1.  Left basilar atelectasis and small effusions.  2.  No evidence of focal infiltrate.  Original Report Authenticated By: Suzy Bouchard, M.D.    Cardiac Studies:  Assessment/Plan:   Principal Problem:  *Diastolic CHF, acute, EF 123456 with grade 2 diastolic dysfunction, BNP 11k  Active Problems:  DIABETES MELLITUS-TYPE II, sugars drifting up with steroids  HTN (hypertension), poor control  URI, negative for influenza  S/P CABG (coronary artery bypass graft), 12/04/11  PAF post CABG, NSR now  Plan- suspect she has a combination of acute diastolic CHF and URI, (no history of asthma or COPD and she never smoked). She is on ABs and steroids. Will give her 40mg  lasix IV this am and add HCTZ to discharge meds.  Would not restart ACE at discharge, use ARB/ HCTZ combination. She may require a 3d agent for B/P,(?Norvasc). She may be ready for discharge in am. Will f/u labs in am.    Kerin Ransom PA-C 12/27/2011,  11:19 AM

## 2011-12-27 NOTE — Progress Notes (Signed)
Pt ambulating in hall with daughter without oxygen. Pt sat 90%. Pt not SOB at this time. VSS. Will continue to monitor.

## 2011-12-27 NOTE — Progress Notes (Signed)
Utilzation review complete.

## 2011-12-27 NOTE — Progress Notes (Signed)
[place]   Reason for Consult: Wheezing dyspnea Referring Physician: Dr Hilty/[facility]      Dr Osborne Casco is PCP  [name] is an 71 y.o. femalewith PMH of HTN, HLD, poorly controlled DM, HLD, hypothyroidism and depression who had recent non-ST elevation myocardial infarction December 04, 2011 resulting in coronary artery bypass grafting with a LIMA to the LAD, Vein graft to the OM, vein graft to the RCA. She had noted postop atrial fibrillation. The patient was raised recently seen by Dr. Rex Kras at [facility] vascular or shortness of breath productive cough. Sister- in law/ caregiver  had been spending time with her, apparently was sick with respiratory infection and cough in the home 1 week earlier. The patient was started on Z-Pak on 2/27. Took 3 tabs before drug stopped due to QT prolongation concern. Pt had minor cough for several days with yellow-brown sputum, sinus pressure, dyspnea and R lateral rib pain. PCCM consulted 3/3 for dyspnea / URI symptoms.     Subjective:  "feeling much better, shortness of breath improved"   EXAM Filed Vitals:   12/26/11 1600 12/26/11 2100 12/27/11 0500 12/27/11 0936  BP: 167/72 149/61 140/50 175/72  Pulse: 58 63 56 63  Temp: 98.7 F (37.1 C) 99.1 F (37.3 C) 98.3 F (36.8 C)   TempSrc: Oral Oral Oral   Resp: 20 18 18    Height:      Weight:   145 lb 8.1 oz (66 kg)   SpO2: 100% 98% 98%    General: elderly female in NAD Neuro: Awake/alert CV: s1s2 rrr, no m/r/g, healing midline scar PULM: resp's even/non-labored, lungs bilaterally clear with prominent pseudowheeze heard over trachea GI: obese, soft, bsx4 active Extremities: warm / dry, mild bruising to medial aspect R ankle on graft side (s/p cabg)  Labs: CBC  Lab 12/26/11 1628 12/24/11 1050  HGB 10.5* 9.6*  HCT 31.0* 28.0*  WBC 6.7 5.1  PLT 283 303    BMP  Lab 12/26/11 1040 12/26/11 0510 12/25/11 0700 12/24/11 1954 12/24/11 1050  NA -- 129* 130* -- 132*  K -- 4.8 4.4 -- --  CL -- 96 97 -- 100  CO2 -- 21 21 -- 21  GLUCOSE -- 167* 126* -- 201*  BUN -- 24* 15 -- 12  CREATININE 1.02 1.14* 0.99 -- 0.75  CALCIUM -- 8.9 9.1 -- 9.1  MG -- 2.4 -- 1.4* --  PHOS -- -- -- -- --     Assessment/Plan: Acute bronchitis with asthma. Probable community acquired pneumonia L base. Exposure to family member w/ cough more likely source than her recent hosp for CABG, and therefore this was likely initially viral.  -Plan  -doxycycline, continue rocephin -wean O2 to off for sats > 90% -continue solumedrol - no indication for spiriva -Sputum Cx -D/c ACEI ACE inhibitors are problematic in  pts with airway complaints because  even experienced pulmonologists can't always distinguish ace effects from copd/asthma/pnds/ allergies etc.  By themselves they don't actually cause a problem, much like oxygen can't by itself start a fire, but they certainly serve as a powerful catalyst or enhancer for any "fire"  or inflammatory process in the upper airway, be it caused by an ET  tube or more commonly reflux (especially in the obese or pts with known GERD or who are on biphoshonates) or URI's, due to interference with bradykinin clearance.  The effects of acei on bradykinin levels occurs in 100% of pt's on acei (unless they surreptitiously stop the  med!) but the classic cough is only reported in 5%.  This leaves 95% of pts on acei's  with a variety of syndromes including no identifiable symptom in most  vs non-specific symptoms that wax and wane depending on what other insult is occuring at the level of the upper airway (like reflux or an ET Tube or viral tracheobronchitis). The only way to be usre is stop the drug for a month and then regroup with baseline symptoms/pft's and then add back the acei if deemed vital to longitudinal care and in the meantime rx with  ARB    Pleural effusion- pre-existing and probably cardiogenic. She sleeps left decubitus. Doubt Dressler's at this point.    Noe Gens, NP-C [facility] Pgr: [phone]  Pt independently  seen and examined and available cxr's reviewed and I agree with above findings/ imp/ plan - no role for spiriva here - this is not copd  Christinia Gully, MD [facility] [phone]

## 2011-12-27 NOTE — Progress Notes (Signed)
Pt ambulating in hall without oxygen. Pt stats 93-94%. Pt has no complaints of SOB, no wheezing, no chest discomfort. Will continue to monitor. Hulen Luster, RN

## 2011-12-27 NOTE — Progress Notes (Signed)
Inpatient Diabetes Program Recommendations  AACE/ADA: New Consensus Statement on Inpatient Glycemic Control (2009)  Target Ranges:  Prepandial:   less than 140 mg/dL      Peak postprandial:   less than 180 mg/dL (1-2 hours)      Critically ill patients:  140 - 180 mg/dL   Reason for Visit: Hyperglycemia  Results for [name], [name] (MRN [id number]) as of 12/27/2011 14:04  Ref. Range 12/26/2011 07:42 12/26/2011 11:31 12/26/2011 16:39 12/26/2011 21:12 12/27/2011 07:45 12/27/2011 11:35  Glucose-Capillary Latest Range: 70-99 mg/dL 167 (H) 188 (H) 140 (H) 204 (H) 209 (H) 222 (H)    Note: Glycemic control worsening with Solumedrol.  Please increase intensity of correction scale to moderate or resistant.  Also consider adding medium CHO modification to diet order.

## 2011-12-27 NOTE — Progress Notes (Signed)
Pt ambulated in the hall with nurse. Pt became dizzy and loss balance. Pt O2 sat was checked and found to be 89%. Pt returned to room and requested oxygen. Pt was placed on 1L O2. Sats increased to 98%. Will wean patient from oxygen once pt is able to "catch breath". Will continue to monitor. Hulen Luster, RN

## 2011-12-27 NOTE — Progress Notes (Signed)
Pt. Seen and examined. Agree with the NP/PA-C note as written.  Breathing is somewhat better today .Marland Kitchen Not sure why she has acute exacerbations.  Definitely diastolic HF component .Marland Kitchen Probable URI as well.  Appreciate pulmonary assistance. Diuresed more today. Will re-evaluate tomorrow, may be able to send home on po diuretics. Will need to check BNP and BMP in am tomorrow - she has been hyponatremic, suspect due to heart failure.   [name], MD, [facility] Attending Cardiologist [facility] [place]

## 2011-12-28 ENCOUNTER — Other Ambulatory Visit: Payer: Self-pay | Admitting: Cardiothoracic Surgery

## 2011-12-28 DIAGNOSIS — I2510 Atherosclerotic heart disease of native coronary artery without angina pectoris: Secondary | ICD-10-CM

## 2011-12-28 LAB — GLUCOSE, CAPILLARY
Glucose-Capillary: 208 mg/dL — ABNORMAL HIGH (ref 70–99)
Glucose-Capillary: 261 mg/dL — ABNORMAL HIGH (ref 70–99)
Glucose-Capillary: 220 mg/dL — ABNORMAL HIGH (ref 70–99)
Glucose-Capillary: 172 mg/dL — ABNORMAL HIGH (ref 70–99)

## 2011-12-28 LAB — BASIC METABOLIC PANEL
BUN: 47 mg/dL — ABNORMAL HIGH (ref 6–23)
CO2: 21 mEq/L (ref 19–32)
Calcium: 9.2 mg/dL (ref 8.4–10.5)
Chloride: 95 mEq/L — ABNORMAL LOW (ref 96–112)
Creatinine, Ser: 1.34 mg/dL — ABNORMAL HIGH (ref 0.50–1.10)
GFR calc Af Amer: 45 mL/min — ABNORMAL LOW (ref >90)
GFR calc non Af Amer: 39 mL/min — ABNORMAL LOW (ref >90)
Glucose, Bld: 228 mg/dL — ABNORMAL HIGH (ref 70–99)
Potassium: 5.2 mEq/L — ABNORMAL HIGH (ref 3.5–5.1)
Sodium: 128 mEq/L — ABNORMAL LOW (ref 135–145)

## 2011-12-28 LAB — PRO B NATRIURETIC PEPTIDE: Pro B Natriuretic peptide (BNP): 7928 pg/mL — ABNORMAL HIGH (ref 0–125)

## 2011-12-28 MED ORDER — HYDRALAZINE HCL 20 MG/ML IJ SOLN
2.0000 mg | Freq: Once | INTRAMUSCULAR | Status: AC
  Administered 2011-12-28: 2 mg via INTRAVENOUS
  Filled 2011-12-28: qty 1

## 2011-12-28 MED ORDER — FUROSEMIDE 10 MG/ML IJ SOLN
40.0000 mg | Freq: Two times a day (BID) | INTRAMUSCULAR | Status: AC
  Administered 2011-12-28 (×2): 40 mg via INTRAVENOUS
  Filled 2011-12-28 (×2): qty 4

## 2011-12-28 MED ORDER — FUROSEMIDE 40 MG PO TABS
40.0000 mg | ORAL_TABLET | Freq: Two times a day (BID) | ORAL | Status: DC
  Administered 2011-12-29: 40 mg via ORAL
  Filled 2011-12-28 (×4): qty 1

## 2011-12-28 NOTE — Progress Notes (Signed)
Subjective:  Still has a cough.   Objective:  Vital Signs in the last 24 hours: Temp:  [97.9 F (36.6 C)-98.4 F (36.9 C)] 98.1 F (36.7 C) (03/05 0645) Pulse Rate:  [57-65] 59  (03/05 0645) Resp:  [18] 18  (03/04 2045) BP: (146-178)/(62-72) 172/69 mmHg (03/05 0645) SpO2:  [96 %-97 %] 97 % (03/05 0645) Weight:  [66 kg (145 lb 8.1 oz)] 66 kg (145 lb 8.1 oz) (03/05 0645)  Intake/Output from previous day:  Intake/Output Summary (Last 24 hours) at 12/28/11 0817 Last data filed at 12/28/11 0300  Gross per 24 hour  Intake    480 ml  Output   1150 ml  Net   -670 ml    Physical Exam: General appearance: alert, cooperative and no distress Lungs: decreased breath sounds Lt base Heart: regular rate and rhythm No edema   Rate: 60  Rhythm: normal sinus rhythm  Lab Results:  Basename 12/26/11 1628  WBC 6.7  HGB 10.5*  PLT 283    Basename 12/28/11 0513 12/26/11 1040 12/26/11 0510  NA 128* -- 129*  K 5.2* -- 4.8  CL 95* -- 96  CO2 21 -- 21  GLUCOSE 228* -- 167*  BUN 47* -- 24*  CREATININE 1.34* 1.02 --   No results found for this basename: TROPONINI:2,CK,MB:2 in the last 72 hours Hepatic Function Panel No results found for this basename: PROT,ALBUMIN,AST,ALT,ALKPHOS,BILITOT,BILIDIR,IBILI in the last 72 hours No results found for this basename: CHOL in the last 72 hours No results found for this basename: INR in the last 72 hours  Imaging: Imaging results have been reviewed  Cardiac Studies:  Assessment/Plan:   Principal Problem:  *Diastolic CHF, acute, EF 123456 with grade 2 diastolic dysfunction, BNP 11k  Active Problems:  DIABETES MELLITUS-TYPE II  HTN (hypertension)  URI, negative for influenza  S/P CABG (coronary artery bypass graft), 12/04/11  PAF post CABG, NSR now   Plan- She is still fluid overloaded. Will give IV Lasix BID today.   Kerin Ransom PA-C 12/28/2011, 8:17 AM  Agree with note written by Kerin Ransom [facility]  Admitted with SOB secondary for ?  PNA as well as CHF secondary to diastolic dysfunction. On ATBX as well as steroids. Getting lasix PRN with minimal neg fluid balance. Initial BNP about 12,000, now down to 8000. It was 4000 in Feb. Has LLL crackles on exam without lower extremity edema. Feels clinically improved. Will give IV lasix today and put on running PO lasix tomorrow. Can probably be D/Cd tomorrow on po diuretics and ATBX. Re check BNP in AM.  Quay Burow J 12/28/2011 8:22 AM

## 2011-12-28 NOTE — Progress Notes (Addendum)
Patient's blood pressure elevated throughout the night on 3/4. BP at 2045 176/66. Lopressor 25 mg po given at 2107 for a BP of 178/62. BP still elevated this am per 6 am vital signs with a BP of 172/69. Baltazar Najjar NP notified. Told to give 2 mg iv hydralazine. Will continue to monitor.

## 2011-12-29 ENCOUNTER — Ambulatory Visit: Payer: Self-pay | Admitting: Cardiothoracic Surgery

## 2011-12-29 DIAGNOSIS — E875 Hyperkalemia: Secondary | ICD-10-CM | POA: Diagnosis not present

## 2011-12-29 LAB — GLUCOSE, CAPILLARY
Glucose-Capillary: 100 mg/dL — ABNORMAL HIGH (ref 70–99)
Glucose-Capillary: 149 mg/dL — ABNORMAL HIGH (ref 70–99)

## 2011-12-29 LAB — BASIC METABOLIC PANEL
BUN: 56 mg/dL — ABNORMAL HIGH (ref 6–23)
CO2: 23 mEq/L (ref 19–32)
Calcium: 9.2 mg/dL (ref 8.4–10.5)
Chloride: 98 mEq/L (ref 96–112)
Creatinine, Ser: 1.4 mg/dL — ABNORMAL HIGH (ref 0.50–1.10)
GFR calc Af Amer: 42 mL/min — ABNORMAL LOW (ref >90)
GFR calc non Af Amer: 37 mL/min — ABNORMAL LOW (ref >90)
Glucose, Bld: 101 mg/dL — ABNORMAL HIGH (ref 70–99)
Potassium: 5.5 mEq/L — ABNORMAL HIGH (ref 3.5–5.1)
Sodium: 133 mEq/L — ABNORMAL LOW (ref 135–145)

## 2011-12-29 LAB — CULTURE, RESPIRATORY W GRAM STAIN

## 2011-12-29 LAB — PRO B NATRIURETIC PEPTIDE: Pro B Natriuretic peptide (BNP): 6451 pg/mL — ABNORMAL HIGH (ref 0–125)

## 2011-12-29 MED ORDER — FUROSEMIDE 20 MG PO TABS
20.0000 mg | ORAL_TABLET | Freq: Every day | ORAL | Status: DC
  Administered 2011-12-29: 20 mg via ORAL
  Filled 2011-12-29: qty 1

## 2011-12-29 MED ORDER — FUROSEMIDE 40 MG PO TABS: 40.0000 mg | ORAL_TABLET | Freq: Every day | ORAL | Status: DC

## 2011-12-29 MED ORDER — AMLODIPINE BESYLATE 10 MG PO TABS
10.0000 mg | ORAL_TABLET | Freq: Every day | ORAL | Status: DC
  Administered 2011-12-29: 10 mg via ORAL
  Filled 2011-12-29 (×2): qty 1

## 2011-12-29 MED ORDER — FUROSEMIDE 20 MG PO TABS: 20.0000 mg | ORAL_TABLET | Freq: Every day | ORAL | Status: DC

## 2011-12-29 MED ORDER — DOXYCYCLINE HYCLATE 100 MG PO TABS: 100.0000 mg | ORAL_TABLET | Freq: Two times a day (BID) | ORAL | Status: DC

## 2011-12-29 MED ORDER — AMLODIPINE BESYLATE 10 MG PO TABS: 10.0000 mg | ORAL_TABLET | Freq: Every day | ORAL | Status: DC

## 2011-12-29 NOTE — Plan of Care (Signed)
Problem: Food- and Nutrition-Related Knowledge Deficit (NB-1.1) Goal: Nutrition education Formal process to instruct or train a patient/client in a skill or to impart knowledge to help patients/clients voluntarily manage or modify food choices and eating behavior to maintain or improve health.  Outcome: Completed/Met Date Met:  12/29/11 RD consult for Heart Healthy/DM diet education. Reviewed guidelines and recommendations. Handouts provided on Heart Healthy Nutrition Therapy and Carbohydrate Counting for People With Diabetes from [employer]. Questions answered. No nutrition triggers identified per admission nutrition screen. BMI = 26.4 kg/m2. Pt consumed 90% at breakfast this AM on a Heart Healthy diet. No further nutrition intervention warranted at this time. Please re-consult RD if needed.  Lady Deutscher, RD, LDN Pager #: [phone]

## 2011-12-29 NOTE — Progress Notes (Signed)
Went over D/C instructions with pt. Pt watch CHF video. Instructions went over with daughter. Pt discharged in wheelchair to private vehicle. Discharged home.

## 2011-12-29 NOTE — Discharge Instructions (Signed)
Heart Failure Heart failure (HF) is a condition in which the heart has trouble pumping blood. This means your heart does not pump blood efficiently for your body to work well. In some cases of HF, fluid may back up into your lungs or you may have swelling (edema) in your lower legs. HF is a long-term (chronic) condition. It is important for you to take good care of yourself and follow your caregiver's treatment plan. CAUSES   Health conditions:   High blood pressure (hypertension) causes the heart muscle to work harder than normal. When pressure in the blood vessels is high, the heart needs to pump (contract) with more force in order to circulate blood throughout the body. High blood pressure eventually causes the heart to become stiff and weak.   Coronary artery disease (CAD) is the buildup of cholesterol and fat (plaques) in the arteries of the heart. The blockage in the arteries deprives the heart muscle of oxygen and blood. This can cause chest pain and may lead to a heart attack. High blood pressure can also contribute to CAD.   Heart attack (myocardial infarction) occurs when 1 or more arteries in the heart become blocked. The loss of oxygen damages the muscle tissue of the heart. When this happens, part of the heart muscle dies. The injured tissue does not contract as well and weakens the heart's ability to pump blood.   Abnormal heart valves can cause HF when the heart valves do not open and close properly. This makes the heart muscle pump harder to keep the blood flowing.   Heart muscle disease (cardiomyopathy or myocarditis) is damage to the heart muscle from a variety of causes. These can include drug or alcohol abuse, infections, or unknown reasons. These can increase the risk of HF.   Lung disease makes the heart work harder because the lungs do not work properly. This can cause a strain on the heart leading it to fail.   Diabetes increases the risk of HF. High blood sugar contributes  to high fat (lipid) levels in the blood. Diabetes can also cause slow damage to tiny blood vessels that carry important nutrients to the heart muscle. When the heart does not get enough oxygen and food, it can cause the heart to become weak and stiff. This leads to a heart that does not contract efficiently.   Other diseases can contribute to HF. These include abnormal heart rhythms, thyroid problems, and low blood counts (anemia).   Unhealthy lifestyle habits:   Obesity.   Smoking.   Eating foods high in fat and cholesterol.   Eating or drinking beverages high in salt.   Drug or alcohol abuse.   Lack of exercise.  SYMPTOMS  HF symptoms may vary and can be hard to detect. Symptoms may include:  Shortness of breath with activity, such as climbing stairs.   Persistent cough.   Swelling of the feet, ankles, legs, or abdomen.   Unexplained weight gain.   Difficulty breathing when lying flat.   Waking from sleep because of the need to sit up and get more air.   Rapid heartbeat.   Fatigue and loss of energy.   Feeling lightheaded or close to fainting.  DIAGNOSIS  A diagnosis of HF is based on your history, symptoms, physical examination, and diagnostic tests. Diagnostic tests for HF may include:  EKG.   Chest X-ray.   Blood tests.   Exercise stress test.   Blood oxygen test (arterial blood gas).   Evaluation   by a heart doctor (cardiologist).   Ultrasound evaluation of the heart (echocardiogram).   Heart artery test to look for blockages (angiogram).   Radioactive imaging to look at the heart (radionuclide test).  TREATMENT  Treatment is aimed at managing the symptoms of HF. Medicines, lifestyle changes, or surgical intervention may be necessary to treat HF.  Medicines to help treat HF may include:   Angiotensin-converting enzyme (ACE) inhibitors. These block the effects of a blood protein called angiotensin-converting enzyme. ACE inhibitors relax (dilate) the  blood vessels and help lower blood pressure. This decreases the workload of the heart, slows the progression of HF, and improves symptoms.   Angiotensin receptor blockers (ARBs). These medications work similar to ACE inhibitors. ARBs may be an alternative for people who cannot tolerate an ACE inhibitor.   Aldosterone antagonists. This medication helps get rid of extra fluid from your body. This lowers the volume of blood the heart has to pump.   Water pills (diuretics). Diuretics cause the kidneys to remove salt and water from the blood. The extra fluid is removed by urination. By removing extra fluid from the body, diuretics help lower the workload of the heart and help prevent fluid buildup in the lungs so breathing is easier.   Beta blockers. These prevent the heart from beating too fast and improve heart muscle strength. Beta blockers help maintain a normal heart rate, control blood pressure, and improve HF symptoms.   Digitalis. This increases the force of the heartbeat and may be helpful to people with HF or heart rhythm problems.   Healthy lifestyle changes include:   Stopping smoking.   Eating a healthy diet. Avoid foods high in fat. Avoid foods fried in oil or made with fat. A dietician can help with healthy food choices.   Limiting how much salt you eat.   Limiting alcohol intake to no more than 1 drink per day for women and 2 drinks per day for men. Drinking more than that is harmful to your heart. If your heart has already been damaged by alcohol or you have severe HF, drinking alcohol should be stopped completely.   Exercising as directed by your caregiver.   Surgical treatment for HF may include:   Procedures to open blocked arteries, repair damaged heart valves, or remove damaged heart muscle tissue.   A pacemaker to help heart muscle function and to control certain abnormal heart rhythms.   A defibrillator to possibly prevent sudden cardiac death.  HOME CARE  INSTRUCTIONS   Activity level. Your caregiver can help you determine what type of exercise program may be helpful. It is important to maintain your strength. Pace your physical activity to avoid shortness of breath or chest pain. Rest for 1 hour before and after meals. A cardiac rehabilitation program may be helpful to some people with HF.   Diet. Eat a heart healthy diet. Food choices should be low in saturated fat and cholesterol. Talk to a dietician to learn about heart healthy foods.   Salt intake. When you have HF, you need to limit the amount of salt you eat. Eat less than 1500 milligrams (mg) of salt per day or as recommended by your caregiver.   Weight monitoring. Weigh yourself every day. You should weigh yourself in the morning after you urinate and before you eat breakfast. Wear the same amount of clothing each time you weigh yourself. Record your weight daily. Bring your recorded weights to your clinic visits. Tell your caregiver right away if   you have gained 3 lb/1.4 kg in 1 day, or 5 lb/2.3 kg in a week or whatever amount you were told to report.   Blood pressure monitoring. This should be done as directed by your caregiver. A home blood pressure cuff can be purchased at a drugstore. Record your blood pressure numbers and bring them to your clinic visits. Tell your caregiver if you become dizzy or lightheaded upon standing up.   Smoking. If you are currently a smoker, it is time to quit. Nicotine makes your heart work harder by causing your blood vessels to constrict. Do not use nicotine gum or patches before talking to your caregiver.   Follow up. Be sure to schedule a follow-up visit with your caregiver. Keep all your appointments.  SEEK MEDICAL CARE IF:   Your weight increases by 3 lb/1.4 kg in 1 day or 5 lb/2.3 kg in a week.   You notice increasing shortness of breath that is unusual for you. This may happen during rest, sleep, or with activity.   You cough more than normal,  especially with physical activity.   You notice more swelling in your hands, feet, ankles, or belly (abdomen).   You are unable to sleep because it is hard to breathe.   You cough up bloody mucus (sputum).   You begin to feel "jumping" or "fluttering" sensations (palpitations) in your chest.  SEEK IMMEDIATE MEDICAL CARE IF:   You have severe chest pain or pressure which may include symptoms such as:   Pain or pressure in the arms, neck, jaw, or back.   Feeling sweaty.   Feeling sick to your stomach (nauseous).   Feeling short of breath while at rest.   Having a fast or irregular heartbeat.   You experience stroke symptoms. These symptoms include:   Facial weakness or numbness.   Weakness or numbness in an arm, leg, or on one side of your body.   Blurred vision.   Difficulty talking or thinking.   Dizziness or fainting.   Severe headache.  THESE ARE MEDICAL EMERGENCIES. Do not wait to see if the symptoms go away. Call your local emergency services (911 in [place] DO NOT drive yourself to the hospital. IMPORTANT  Make a list of every medicine, vitamin, or herbal supplement you are taking. Keep the list with you at all times. Show it to your caregiver at every visit. Keep the list up-to-date.   Ask your caregiver or pharmacist to write an explanation of each medicine you are taking. This should include:   Why you are taking it.   The possible side effects.   The best time of day to take it.   Foods to take with it or what foods to avoid.   When to stop taking it.  MAKE SURE YOU:   Understand these instructions.   Will watch your condition.   Will get help right away if you are not doing well or get worse.  Document Released: 10/11/2005 Document Revised: 09/30/2011 Document Reviewed: 01/23/2010 [employer] Patient Information 2012 [employer]. 

## 2011-12-29 NOTE — Progress Notes (Signed)
Subjective:  SOB and cough improving.  Objective:  Vital Signs in the last 24 hours: Temp:  [97.2 F (36.2 C)-98.5 F (36.9 C)] 98.5 F (36.9 C) (03/06 0500) Pulse Rate:  [56-66] 56  (03/06 0500) Resp:  [18-20] 20  (03/06 0500) BP: (121-183)/(51-79) 151/79 mmHg (03/06 0500) SpO2:  [97 %-98 %] 97 % (03/06 0500) Weight:  [63.6 kg (140 lb 3.4 oz)] 63.6 kg (140 lb 3.4 oz) (03/06 0500)  Intake/Output from previous day:  Intake/Output Summary (Last 24 hours) at 12/29/11 0853 Last data filed at 12/29/11 0500  Gross per 24 hour  Intake    360 ml  Output   2350 ml  Net  -1990 ml    Physical Exam: General appearance: alert, cooperative and no distress Neck: no JVD Lungs: clear to auscultation bilaterally Heart: regular rate and rhythm Extremities: no edema   Rate: 58  Rhythm: normal sinus rhythm and sinus bradycardia  Lab Results:  Basename 12/26/11 1628  WBC 6.7  HGB 10.5*  PLT 283    Basename 12/29/11 0650 12/28/11 0513  NA 133* 128*  K 5.5* 5.2*  CL 98 95*  CO2 23 21  GLUCOSE 101* 228*  BUN 56* 47*  CREATININE 1.40* 1.34*   No results found for this basename: TROPONINI:2,CK,MB:2 in the last 72 hours Hepatic Function Panel No results found for this basename: PROT,ALBUMIN,AST,ALT,ALKPHOS,BILITOT,BILIDIR,IBILI in the last 72 hours No results found for this basename: CHOL in the last 72 hours No results found for this basename: INR in the last 72 hours  Imaging: No results found.  Cardiac Studies:  Assessment/Plan:   Principal Problem:  *Diastolic CHF, acute, EF 123456 with grade 2 diastolic dysfunction, BNP 11k Active Problems:  DIABETES MELLITUS-TYPE II  HTN currently poor control  URI, negative for influenza  Hyperkalemia, K+ 5.5 on Cozaar  S/P CABG (coronary artery bypass graft), 12/04/11  PAF post CABG, NSR now  Plan- BNP is coming down, she is probably ready for discharge. Her K+  is now up to 5.5, will need to stop ARB, add Norvasc for HTN.  Her BUN  is elevated but she was on IV steroids for a few days. Will discuss lasix dose with MD, I can see her back in one week in follow up. Continue 14 day course of antibiotics.    Kerin Ransom PA-C 12/29/2011, 8:53 AM    Agree with note written by Kerin Ransom [facility]  BNP continues to decrease. Lungs clear. Ambulating without difficulty. Will D/C home on ATBX OP for 14 days and will increase oral lasix dose to 40 mg in AM, 20 mg in PM. F/U with Kerin Ransom as OP. Re check BNP and BMET.  Lorretta Harp 12/29/2011 11:45 AM

## 2011-12-29 NOTE — Discharge Summary (Signed)
Patient ID: [name],  MRN: [id number], DOB/AGE: 72-Feb-1942 72 y.o.  Admit date: 12/24/2011 Discharge date: 12/29/2011  Primary Care Provider: Dr Odette Fraction Primary Cardiologist: Dr Rex Kras  Discharge Diagnoses  Principal Problem:  *Diastolic CHF, acute, EF 123456 with grade 2 diastolic dysfunction  Active Problems:  DIABETES MELLITUS-TYPE II  HTN   URI, negative for influenza, d/c'd on antibiotics  Hyperkalemia on ACE/ARB- stopped this admission  S/P CABG (coronary artery bypass graft), 12/04/11  PAF post CABG, NSR now   Hospital Course: Patient is a 71 year old Caucasian female was received recent non-ST elevation myocardial infarction February 2013 resulting in coronary artery bypass grafting with a LIMA to the LAD, Vein graft to the OM, vein graft to the RCA. She reports some postop atrial fibrillation. History also includes hypertension, poorly controlled diabetes, hyperlipidemia, hypothyroidism, depression. The patient was recently seen by Dr. Rex Kras at [facility] for shortness of breath and productive cough. The patient was started on Z-Pak for which she took  2 days worth of doses. She reports that she has not been able to sleep the last few days she's been rather agitated anxious with nausea diaphoresis, palpitations with cough and chest tightness with cough. She came to the ER 12/24/11.She denies lower extremity edema, orthopnea, PND. She reports that because of the nausea she has not been able to take her medications as prescribed. Her blood pressure in the emergency room is 203/57. She was admitted to telemetry and started on antibiotics for URI, steroids, and diuretics. She was seen in consult by Dr Melvyn Novas who recommended stopping her ACE-I.  Her BNP was 11k on admission. She maintained NSR throughout her hospitalization. She improved symptomatically with diuresis. Her BNP at discharge is down to [facility]. 2D echo showed an EF 15f 60-65% with grade 2 diastolic dysfunction. She  was put on an ARB but had worsening SCr and hyperkalemia and this was stopped. She was put on Norvasc for HTN. We will continue to diurese her as an outpatient. She will see [place] back in one week.   Discharge Vitals:  Blood pressure 151/79, pulse 56, temperature 98.5 F (36.9 C), temperature source Oral, resp. rate 20, height 5\' 1"  (1.549 m), weight 63.6 kg (140 lb 3.4 oz), SpO2 97.00%.    Labs: Results for orders placed during the hospital encounter of 12/24/11 (from the past 48 hour(s))  GLUCOSE, CAPILLARY     Status: Abnormal   Collection Time   12/27/11  4:30 PM      Component Value Range Comment   Glucose-Capillary 209 (*) 70 - 99 (mg/dL)   GLUCOSE, CAPILLARY     Status: Abnormal   Collection Time   12/27/11  8:46 PM      Component Value Range Comment   Glucose-Capillary 199 (*) 70 - 99 (mg/dL)   BASIC METABOLIC PANEL     Status: Abnormal   Collection Time   12/28/11  5:13 AM      Component Value Range Comment   Sodium 128 (*) 135 - 145 (mEq/L)    Potassium 5.2 (*) 3.5 - 5.1 (mEq/L)    Chloride 95 (*) 96 - 112 (mEq/L)    CO2 21  19 - 32 (mEq/L)    Glucose, Bld 228 (*) 70 - 99 (mg/dL)    BUN 47 (*) 6 - 23 (mg/dL)    Creatinine, Ser 1.34 (*) 0.50 - 1.10 (mg/dL)    Calcium 9.2  8.4 - 10.5 (mg/dL)    GFR calc non Af  Amer 39 (*) >90 (mL/min)    GFR calc Af Amer 45 (*) >90 (mL/min)   PRO B NATRIURETIC PEPTIDE     Status: Abnormal   Collection Time   12/28/11  5:13 AM      Component Value Range Comment   Pro B Natriuretic peptide (BNP) 7928.0 (*) 0 - 125 (pg/mL)   GLUCOSE, CAPILLARY     Status: Abnormal   Collection Time   12/28/11  7:31 AM      Component Value Range Comment   Glucose-Capillary 208 (*) 70 - 99 (mg/dL)    Comment 1 Notify RN     GLUCOSE, CAPILLARY     Status: Abnormal   Collection Time   12/28/11 11:36 AM      Component Value Range Comment   Glucose-Capillary 261 (*) 70 - 99 (mg/dL)    Comment 1 Notify RN     GLUCOSE, CAPILLARY     Status: Abnormal   Collection  Time   12/28/11  4:25 PM      Component Value Range Comment   Glucose-Capillary 220 (*) 70 - 99 (mg/dL)    Comment 1 Notify RN     GLUCOSE, CAPILLARY     Status: Abnormal   Collection Time   12/28/11  9:03 PM      Component Value Range Comment   Glucose-Capillary 172 (*) 70 - 99 (mg/dL)   BASIC METABOLIC PANEL     Status: Abnormal   Collection Time   12/29/11  6:50 AM      Component Value Range Comment   Sodium 133 (*) 135 - 145 (mEq/L)    Potassium 5.5 (*) 3.5 - 5.1 (mEq/L) MARKED HEMOLYSIS   Chloride 98  96 - 112 (mEq/L)    CO2 23  19 - 32 (mEq/L)    Glucose, Bld 101 (*) 70 - 99 (mg/dL)    BUN 56 (*) 6 - 23 (mg/dL)    Creatinine, Ser 1.40 (*) 0.50 - 1.10 (mg/dL)    Calcium 9.2  8.4 - 10.5 (mg/dL)    GFR calc non Af Amer 37 (*) >90 (mL/min)    GFR calc Af Amer 42 (*) >90 (mL/min)   PRO B NATRIURETIC PEPTIDE     Status: Abnormal   Collection Time   12/29/11  6:50 AM      Component Value Range Comment   Pro B Natriuretic peptide (BNP) 6451.0 (*) 0 - 125 (pg/mL)   GLUCOSE, CAPILLARY     Status: Abnormal   Collection Time   12/29/11  7:51 AM      Component Value Range Comment   Glucose-Capillary 100 (*) 70 - 99 (mg/dL)   GLUCOSE, CAPILLARY     Status: Abnormal   Collection Time   12/29/11 11:47 AM      Component Value Range Comment   Glucose-Capillary 149 (*) 70 - 99 (mg/dL)     Disposition:  Follow-up Information    Follow up with Haywood Pao, MD. Call in 2 weeks.      Follow up with Kerin Ransom K, PA in 1 week. (office will call)    Contact information:   [address] [place] [phone]          Discharge Medications:  Medication List  As of 12/29/2011 12:35 PM   STOP taking these medications         quinapril 10 MG tablet         TAKE these medications  acetaminophen 500 MG tablet   Commonly known as: TYLENOL   Take 500 mg by mouth every 6 (six) hours as needed. pain      amLODipine 10 MG tablet   Commonly  known as: NORVASC   Take 1 tablet (10 mg total) by mouth daily.      aspirin EC 81 MG tablet   Take 324 mg by mouth daily.      CALCIUM PO   Take 1 tablet by mouth daily.      doxycycline 100 MG tablet   Commonly known as: VIBRA-TABS   Take 1 tablet (100 mg total) by mouth every 12 (twelve) hours.      esomeprazole 40 MG capsule   Commonly known as: NEXIUM   Take 40 mg by mouth daily before breakfast.      furosemide 20 MG tablet   Commonly known as: LASIX   Take 1 tablet (20 mg total) by mouth daily at 6 PM.      furosemide 40 MG tablet   Commonly known as: LASIX   Take 1 tablet (40 mg total) by mouth daily.      gabapentin 300 MG capsule   Commonly known as: NEURONTIN   Take 1 capsule (300 mg total) by mouth 2 (two) times daily.      glimepiride 4 MG tablet   Commonly known as: AMARYL   Take 4 mg by mouth 2 (two) times daily.      levothyroxine 50 MCG tablet   Commonly known as: SYNTHROID, LEVOTHROID   Take 50 mcg by mouth every other day.      meclizine 25 MG tablet   Commonly known as: ANTIVERT   Take 25 mg by mouth 4 (four) times daily as needed. For dizziness      metoprolol tartrate 25 MG tablet   Commonly known as: LOPRESSOR   Take 25 mg by mouth 2 (two) times daily.      mulitivitamin with minerals Tabs   Take 1 tablet by mouth daily.      polysaccharide iron 150 MG Caps capsule   Commonly known as: NIFEREX   Take 1 capsule (150 mg total) by mouth daily.      rosuvastatin 10 MG tablet   Commonly known as: CRESTOR   Take 1 tablet (10 mg total) by mouth daily at 6 PM.      sitaGLIPtan-metformin 50-1000 MG per tablet   Commonly known as: JANUMET   Take 1 tablet by mouth 2 (two) times daily with a meal.      SYSTANE OP   Apply 1 drop to eye 2 (two) times daily.      VITAMIN B 12 PO   Take 1 tablet by mouth daily.      VITAMIN C PO   Take 1 tablet by mouth daily.      VITAMIN D (CHOLECALCIFEROL) PO   Take 2 tablets by mouth daily.              Outstanding Labs/Studies  Duration of Discharge Encounter: Greater than 30 minutes including physician time.  Angelena Form PA-C 12/29/2011 12:35 PM

## 2011-12-31 ENCOUNTER — Other Ambulatory Visit: Payer: Self-pay | Admitting: Cardiothoracic Surgery

## 2011-12-31 DIAGNOSIS — I2510 Atherosclerotic heart disease of native coronary artery without angina pectoris: Secondary | ICD-10-CM

## 2012-01-03 ENCOUNTER — Inpatient Hospital Stay (HOSPITAL_COMMUNITY)
Admission: AD | Admit: 2012-01-03 | Discharge: 2012-01-07 | DRG: 683 | Disposition: A | Discharge status: Home / Self Care | Payer: Medicare Other | Source: Ambulatory Visit | Attending: Cardiology | Admitting: Cardiology

## 2012-01-03 ENCOUNTER — Encounter (HOSPITAL_COMMUNITY): Payer: Self-pay | Admitting: Cardiology

## 2012-01-03 ENCOUNTER — Inpatient Hospital Stay (HOSPITAL_COMMUNITY): Payer: Medicare Other

## 2012-01-03 DIAGNOSIS — B373 Candidiasis of vulva and vagina: Secondary | ICD-10-CM | POA: Diagnosis present

## 2012-01-03 DIAGNOSIS — E7800 Pure hypercholesterolemia, unspecified: Secondary | ICD-10-CM | POA: Diagnosis present

## 2012-01-03 DIAGNOSIS — I10 Essential (primary) hypertension: Secondary | ICD-10-CM | POA: Diagnosis present

## 2012-01-03 DIAGNOSIS — J069 Acute upper respiratory infection, unspecified: Secondary | ICD-10-CM | POA: Diagnosis present

## 2012-01-03 DIAGNOSIS — Z7982 Long term (current) use of aspirin: Secondary | ICD-10-CM

## 2012-01-03 DIAGNOSIS — F3289 Other specified depressive episodes: Secondary | ICD-10-CM | POA: Diagnosis present

## 2012-01-03 DIAGNOSIS — E875 Hyperkalemia: Secondary | ICD-10-CM | POA: Diagnosis not present

## 2012-01-03 DIAGNOSIS — Z79899 Other long term (current) drug therapy: Secondary | ICD-10-CM

## 2012-01-03 DIAGNOSIS — Z961 Presence of intraocular lens: Secondary | ICD-10-CM

## 2012-01-03 DIAGNOSIS — I5032 Chronic diastolic (congestive) heart failure: Secondary | ICD-10-CM | POA: Diagnosis present

## 2012-01-03 DIAGNOSIS — Z8249 Family history of ischemic heart disease and other diseases of the circulatory system: Secondary | ICD-10-CM

## 2012-01-03 DIAGNOSIS — K219 Gastro-esophageal reflux disease without esophagitis: Secondary | ICD-10-CM | POA: Diagnosis present

## 2012-01-03 DIAGNOSIS — Z951 Presence of aortocoronary bypass graft: Secondary | ICD-10-CM

## 2012-01-03 DIAGNOSIS — E1159 Type 2 diabetes mellitus with other circulatory complications: Secondary | ICD-10-CM | POA: Diagnosis present

## 2012-01-03 DIAGNOSIS — I214 Non-ST elevation (NSTEMI) myocardial infarction: Secondary | ICD-10-CM | POA: Diagnosis present

## 2012-01-03 DIAGNOSIS — IMO0001 Reserved for inherently not codable concepts without codable children: Secondary | ICD-10-CM | POA: Diagnosis present

## 2012-01-03 DIAGNOSIS — F329 Major depressive disorder, single episode, unspecified: Secondary | ICD-10-CM | POA: Diagnosis present

## 2012-01-03 DIAGNOSIS — E1142 Type 2 diabetes mellitus with diabetic polyneuropathy: Secondary | ICD-10-CM | POA: Diagnosis present

## 2012-01-03 DIAGNOSIS — B3731 Acute candidiasis of vulva and vagina: Secondary | ICD-10-CM | POA: Diagnosis present

## 2012-01-03 DIAGNOSIS — E860 Dehydration: Secondary | ICD-10-CM | POA: Diagnosis present

## 2012-01-03 DIAGNOSIS — E1149 Type 2 diabetes mellitus with other diabetic neurological complication: Secondary | ICD-10-CM | POA: Diagnosis present

## 2012-01-03 DIAGNOSIS — D649 Anemia, unspecified: Secondary | ICD-10-CM | POA: Diagnosis not present

## 2012-01-03 DIAGNOSIS — M19049 Primary osteoarthritis, unspecified hand: Secondary | ICD-10-CM | POA: Diagnosis present

## 2012-01-03 DIAGNOSIS — E785 Hyperlipidemia, unspecified: Secondary | ICD-10-CM | POA: Diagnosis present

## 2012-01-03 DIAGNOSIS — I4891 Unspecified atrial fibrillation: Secondary | ICD-10-CM | POA: Diagnosis present

## 2012-01-03 DIAGNOSIS — N179 Acute kidney failure, unspecified: Principal | ICD-10-CM

## 2012-01-03 DIAGNOSIS — E039 Hypothyroidism, unspecified: Secondary | ICD-10-CM | POA: Diagnosis present

## 2012-01-03 DIAGNOSIS — E119 Type 2 diabetes mellitus without complications: Secondary | ICD-10-CM | POA: Diagnosis present

## 2012-01-03 DIAGNOSIS — Z96659 Presence of unspecified artificial knee joint: Secondary | ICD-10-CM

## 2012-01-03 DIAGNOSIS — I509 Heart failure, unspecified: Secondary | ICD-10-CM | POA: Diagnosis present

## 2012-01-03 HISTORY — DX: Acute kidney failure, unspecified: N17.9

## 2012-01-03 LAB — GLUCOSE, CAPILLARY
Glucose-Capillary: 99 mg/dL (ref 70–99)
Glucose-Capillary: 47 mg/dL — ABNORMAL LOW (ref 70–99)
Glucose-Capillary: 54 mg/dL — ABNORMAL LOW (ref 70–99)
Glucose-Capillary: 63 mg/dL — ABNORMAL LOW (ref 70–99)

## 2012-01-03 LAB — CBC
HCT: 35.8 % — ABNORMAL LOW (ref 36.0–46.0)
Hemoglobin: 12.2 g/dL (ref 12.0–15.0)
MCH: 31.4 pg (ref 26.0–34.0)
MCHC: 34.1 g/dL (ref 30.0–36.0)
MCV: 92.3 fL (ref 78.0–100.0)
Platelets: 410 10*3/uL — ABNORMAL HIGH (ref 150–400)
RBC: 3.88 MIL/uL (ref 3.87–5.11)
RDW: 13.7 % (ref 11.5–15.5)
WBC: 16.8 10*3/uL — ABNORMAL HIGH (ref 4.0–10.5)

## 2012-01-03 LAB — COMPREHENSIVE METABOLIC PANEL
ALT: 9 U/L (ref 0–35)
AST: 19 U/L (ref 0–37)
Albumin: 3.8 g/dL (ref 3.5–5.2)
Alkaline Phosphatase: 98 U/L (ref 39–117)
BUN: 98 mg/dL — ABNORMAL HIGH (ref 6–23)
CO2: 21 mEq/L (ref 19–32)
Calcium: 10 mg/dL (ref 8.4–10.5)
Chloride: 95 mEq/L — ABNORMAL LOW (ref 96–112)
Creatinine, Ser: 4.83 mg/dL — ABNORMAL HIGH (ref 0.50–1.10)
GFR calc Af Amer: 9 mL/min — ABNORMAL LOW (ref >90)
GFR calc non Af Amer: 8 mL/min — ABNORMAL LOW (ref >90)
Glucose, Bld: 93 mg/dL (ref 70–99)
Potassium: 5.9 mEq/L — ABNORMAL HIGH (ref 3.5–5.1)
Sodium: 136 mEq/L (ref 135–145)
Total Bilirubin: 0.4 mg/dL (ref 0.3–1.2)
Total Protein: 7.6 g/dL (ref 6.0–8.3)

## 2012-01-03 LAB — MAGNESIUM: Magnesium: 1.4 mg/dL — ABNORMAL LOW (ref 1.5–2.5)

## 2012-01-03 LAB — PHOSPHORUS: Phosphorus: 6.3 mg/dL — ABNORMAL HIGH (ref 2.3–4.6)

## 2012-01-03 LAB — PRO B NATRIURETIC PEPTIDE: Pro B Natriuretic peptide (BNP): 2769 pg/mL — ABNORMAL HIGH (ref 0–125)

## 2012-01-03 MED ORDER — INSULIN ASPART 100 UNIT/ML ~~LOC~~ SOLN: 0.0000 [IU] | Freq: Every day | SUBCUTANEOUS | Status: DC

## 2012-01-03 MED ORDER — ACETAMINOPHEN 500 MG PO TABS
500.0000 mg | ORAL_TABLET | Freq: Four times a day (QID) | ORAL | Status: DC | PRN
  Filled 2012-01-03: qty 1

## 2012-01-03 MED ORDER — ASPIRIN EC 81 MG PO TBEC: 324.0000 mg | DELAYED_RELEASE_TABLET | Freq: Every day | ORAL | Status: DC

## 2012-01-03 MED ORDER — HEPARIN SODIUM (PORCINE) 5000 UNIT/ML IJ SOLN
5000.0000 [IU] | Freq: Three times a day (TID) | INTRAMUSCULAR | Status: DC
  Administered 2012-01-03 – 2012-01-06 (×7): 5000 [IU] via SUBCUTANEOUS
  Filled 2012-01-03 (×15): qty 1

## 2012-01-03 MED ORDER — SODIUM POLYSTYRENE SULFONATE 15 GM/60ML PO SUSP
15.0000 g | Freq: Once | ORAL | Status: AC
  Administered 2012-01-03: 15 g via ORAL
  Filled 2012-01-03: qty 60

## 2012-01-03 MED ORDER — AMLODIPINE BESYLATE 10 MG PO TABS
10.0000 mg | ORAL_TABLET | Freq: Every day | ORAL | Status: DC
  Administered 2012-01-03 – 2012-01-04 (×2): 10 mg via ORAL
  Filled 2012-01-03 (×2): qty 1

## 2012-01-03 MED ORDER — POLYSACCHARIDE IRON 150 MG PO CAPS
150.0000 mg | ORAL_CAPSULE | Freq: Every day | ORAL | Status: DC
  Filled 2012-01-03: qty 1

## 2012-01-03 MED ORDER — DOXYCYCLINE HYCLATE 100 MG PO TABS
100.0000 mg | ORAL_TABLET | Freq: Two times a day (BID) | ORAL | Status: DC
  Filled 2012-01-03: qty 1

## 2012-01-03 MED ORDER — ATORVASTATIN CALCIUM 20 MG PO TABS
20.0000 mg | ORAL_TABLET | Freq: Every day | ORAL | Status: DC
  Administered 2012-01-03 – 2012-01-06 (×4): 20 mg via ORAL
  Filled 2012-01-03 (×5): qty 1

## 2012-01-03 MED ORDER — DOCUSATE SODIUM 100 MG PO CAPS
100.0000 mg | ORAL_CAPSULE | Freq: Two times a day (BID) | ORAL | Status: DC
  Administered 2012-01-05 – 2012-01-07 (×5): 100 mg via ORAL
  Filled 2012-01-03 (×9): qty 1

## 2012-01-03 MED ORDER — LEVOTHYROXINE SODIUM 50 MCG PO TABS
50.0000 ug | ORAL_TABLET | ORAL | Status: DC
  Administered 2012-01-04 – 2012-01-06 (×2): 50 ug via ORAL
  Filled 2012-01-03 (×3): qty 1

## 2012-01-03 MED ORDER — ASPIRIN EC 81 MG PO TBEC
81.0000 mg | DELAYED_RELEASE_TABLET | Freq: Every day | ORAL | Status: DC
  Administered 2012-01-03 – 2012-01-07 (×5): 81 mg via ORAL
  Filled 2012-01-03 (×5): qty 1

## 2012-01-03 MED ORDER — SODIUM CHLORIDE 0.9 % IV SOLN
INTRAVENOUS | Status: DC
  Administered 2012-01-03: 18:00:00 via INTRAVENOUS
  Administered 2012-01-04: 1000 mL via INTRAVENOUS
  Administered 2012-01-05 – 2012-01-06 (×2): via INTRAVENOUS

## 2012-01-03 MED ORDER — ONDANSETRON HCL 4 MG PO TABS: 4.0000 mg | ORAL_TABLET | Freq: Four times a day (QID) | ORAL | Status: DC | PRN

## 2012-01-03 MED ORDER — INSULIN ASPART 100 UNIT/ML ~~LOC~~ SOLN
0.0000 [IU] | Freq: Three times a day (TID) | SUBCUTANEOUS | Status: DC
  Administered 2012-01-04: 2 [IU] via SUBCUTANEOUS
  Administered 2012-01-05: 1 [IU] via SUBCUTANEOUS
  Administered 2012-01-05: 5 [IU] via SUBCUTANEOUS
  Administered 2012-01-06: 7 [IU] via SUBCUTANEOUS
  Administered 2012-01-07: 1 [IU] via SUBCUTANEOUS

## 2012-01-03 MED ORDER — DEXTROSE 50 % IV SOLN
INTRAVENOUS | Status: AC
  Administered 2012-01-03: 25 mL
  Filled 2012-01-03: qty 50

## 2012-01-03 MED ORDER — METOPROLOL TARTRATE 25 MG PO TABS
25.0000 mg | ORAL_TABLET | Freq: Two times a day (BID) | ORAL | Status: DC
  Administered 2012-01-04: 25 mg via ORAL
  Filled 2012-01-03 (×3): qty 1

## 2012-01-03 MED ORDER — ONDANSETRON HCL 4 MG/2ML IJ SOLN: 4.0000 mg | Freq: Four times a day (QID) | INTRAMUSCULAR | Status: DC | PRN

## 2012-01-03 MED ORDER — GABAPENTIN 300 MG PO CAPS
300.0000 mg | ORAL_CAPSULE | Freq: Two times a day (BID) | ORAL | Status: DC
  Administered 2012-01-03 – 2012-01-04 (×2): 300 mg via ORAL
  Filled 2012-01-03 (×3): qty 1

## 2012-01-03 MED ORDER — PANTOPRAZOLE SODIUM 40 MG PO TBEC
80.0000 mg | DELAYED_RELEASE_TABLET | Freq: Every day | ORAL | Status: DC
  Administered 2012-01-03 – 2012-01-07 (×5): 80 mg via ORAL
  Filled 2012-01-03: qty 2
  Filled 2012-01-03: qty 1
  Filled 2012-01-03 (×2): qty 2
  Filled 2012-01-03: qty 1
  Filled 2012-01-03: qty 2

## 2012-01-03 NOTE — H&P (Signed)
I have seen and examined the patient along with Cecilie Kicks, NP.  I have reviewed the chart, notes and new data.  I agree with NP's note.  Key new complaints: very poor appetite, reduced (but not absent) urine output Key examination changes: JVP flat, pale, no rales or S3. No thrush. 6 kg less than at discharge Key new findings / data: creat increased from baseline of 1 and discharge level of 1.4 to 4.79; K is 5.9  PLAN: Acute nonoliguric renal failure due to reduced oral intake and continued loop diuretic therapy. DC diuretics IV fluid resuscitation. Stop doxycycline. Fluconazole for vaginal candidiasis (could she also have esophageal candidiasis?).  Sanda Klein, MD, [place] [phone] 01/03/2012, 5:18 PM

## 2012-01-03 NOTE — Progress Notes (Signed)
Pt arrived to unit. Alert and oriented x 4. VSS. No c/o pain or discomfort. Attempted to place peripheral IV unsuccessfully, IV team paged. Oriented to room. Bed in low position. Call bell within reach. Safety video viewed.

## 2012-01-03 NOTE — Progress Notes (Signed)
CBG: 47  Treatment: 15 GM carbohydrate snack  Symptoms: None  Follow-up CBG: Time:2200 CBG Result:54  Possible Reasons for Event: Inadequate meal intake  Comments/MD notified:na    [name], Micheline Chapman

## 2012-01-03 NOTE — H&P (Signed)
[name] is an 71 y.o. female.   Chief Complaint: nausea, vomiting decreased urinary output. HPI: Patient is a 71 year old Caucasian female with recent non-ST elevation myocardial infarction February 2013 resulting in coronary artery bypass grafting with a LIMA to the LAD, Vein graft to the OM, vein graft to the RCA. She reports some postop atrial fibrillation. History also includes hypertension, poorly controlled diabetes, hyperlipidemia, hypothyroidism, depression. She had returned hospitalization 12/24/2011 - 3/6/ 2013 with increase shortness of breath nausea diaphoreses, palpitations and cough and chest tightness with cough which was concerning for possible pneumonia she had been exposed to a healthcare provider with pneumonia.  During a recent hospitalization was felt and diastolic acute on chronic clinic heart failure and was diuresed. Pulmonary consult was also obtained they thought she had acute bronchitis with asthma and probable community-acquired pneumonia at the left base. Does have pleural effusion that was pre-existing and probably cardiogenic.   She slowly improved and was discharged on 12/29/2011, her BNP was decreasing and during that hospitalization her ARB was DC'd secondary to hyperkalemia and her ACEI had been DC'd  by pulmonary as a cause of her increased bronchospasm.   She had generally done well once she arrived back at home until last one to 2 days. She complained of nausea and vomiting with decreased urinary output. Her appetite is nonexistent.  She also has had less bowel movements. She had a basic metabolic panel done through our office and her creatinine was 4.79 BUN 91 potassium 5.9sodium 135.  Dr. Rex Kras recommended admission for acute renal failure with plans for hydration, DC Lasix and treating hyperkalemia.  Blood pressure on arrival to the hospital   105/85, pulse 54, afebrile and weight of 57.74 kg, previous weight 12/29/2011,was 63.6 kg at discharge. She was  discharged on Lasix 40 in the morning and 20 in the afternoon.  Currently no signs of congestive heart failure.   Past Medical History  Diagnosis Date  . Diabetes mellitus   . Hypertension   . GERD (gastroesophageal reflux disease)   . Neuropathy   . PONV (postoperative nausea and vomiting)   . High cholesterol   . Diabetic neuropathy   . Angina   . Myocardial infarction 12/02/11    "they think I might have had a light heart attack this wk"  . Pneumonia   . Bronchitis   . Hypothyroidism   . History of stomach ulcers 1970's  . Arthritis     "in my hands"  . Depression   . S/P CABG (coronary artery bypass graft), 12/04/11 12/07/2011  . Acute renal failure, 01/03/12 01/03/2012    Past Surgical History  Procedure Date  . Fracture surgery     "put pins  both side right ankle"  . Total knee arthroplasty ~ 2006    left  . Joint replacement   . Back surgery 2006    "cyst growing near my spine"  . Tonsillectomy 1949  . Abdominal hysterectomy 1980's  . Dilation and curettage of uterus     "a couple times"  . Cesarean section 1977  . Cataract extraction w/ intraocular lens  implant, bilateral ~ 2010  . Cardiac catheterization 12/02/11  . Coronary artery bypass graft 12/04/2011    Procedure: CORONARY ARTERY BYPASS GRAFTING (CABG);  Surgeon: Tharon Aquas Adelene Idler, MD;  Location: [facility];  Service: Open Heart Surgery;  Laterality: N/A;  CABG x three,  using left internal mammary artery, and right leg greater saphenous vein harvested endoscopically  History reviewed. No pertinent family history.  Both her parents died from heart disease her father at age 80. She has 5 brothers all of whom have had heart disease and 2 sisters had heart disease at a younger age. Other family history includes breast cancer and lung cancer. Social History:  reports that she has never smoked. She has never used smokeless tobacco. She reports that she does not drink alcohol or use illicit drugs. She is widowed for 11  years has 2 children, 5 grandchildren and lives 1 she's been quite active prior to surgery she is retired and her daughter is a Marine scientist at [employer].    Allergies:  Allergies  Allergen Reactions  . Epinephrine Other (See Comments)    Abnormal feeling. Dental exam/injection of local w/ epi.    Medications Prior to Admission  Medication Dose Route Frequency Provider Last Rate Last Dose  . 0.9 %  sodium chloride infusion   Intravenous Continuous Cecilie Kicks, NP      . acetaminophen (TYLENOL) tablet 500 mg  500 mg Oral Q6H PRN Cecilie Kicks, NP      . amLODipine (NORVASC) tablet 10 mg  10 mg Oral Daily Cecilie Kicks, NP      . aspirin EC tablet 81 mg  81 mg Oral Daily Cecilie Kicks, NP      . atorvastatin (LIPITOR) tablet 20 mg  20 mg Oral q1800 Cecilie Kicks, NP      . docusate sodium (COLACE) capsule 100 mg  100 mg Oral BID Cecilie Kicks, NP      . doxycycline (VIBRA-TABS) tablet 100 mg  100 mg Oral Q12H Cecilie Kicks, NP      . gabapentin (NEURONTIN) capsule 300 mg  300 mg Oral BID Cecilie Kicks, NP      . heparin injection 5,000 Units  5,000 Units Subcutaneous Q8H Cecilie Kicks, NP      . insulin aspart (novoLOG) injection 0-5 Units  0-5 Units Subcutaneous QHS Cecilie Kicks, NP      . insulin aspart (novoLOG) injection 0-9 Units  0-9 Units Subcutaneous TID WC Cecilie Kicks, NP      . levothyroxine (SYNTHROID, LEVOTHROID) tablet 50 mcg  50 mcg Oral QODAY Cecilie Kicks, NP      . metoprolol tartrate (LOPRESSOR) tablet 25 mg  25 mg Oral BID Cecilie Kicks, NP      . ondansetron [facility]) tablet 4 mg  4 mg Oral Q6H PRN Cecilie Kicks, NP       Or  . ondansetron [facility]) injection 4 mg  4 mg Intravenous Q6H PRN Cecilie Kicks, NP      . pantoprazole (PROTONIX) EC tablet 80 mg  80 mg Oral Q1200 Cecilie Kicks, NP      . polysaccharide iron (NIFEREX) capsule 150 mg  150 mg Oral Daily Cecilie Kicks, NP      . DISCONTD: aspirin EC tablet 324 mg  324 mg Oral Daily Cecilie Kicks, NP       Medications Prior to  Admission  Medication Sig Dispense Refill  . acetaminophen (TYLENOL) 500 MG tablet Take 500 mg by mouth every 6 (six) hours as needed. pain      . amLODipine (NORVASC) 10 MG tablet Take 1 tablet (10 mg total) by mouth daily.  30 tablet  5  . Ascorbic Acid (VITAMIN C PO) Take 1 tablet by mouth daily.      Marland Kitchen aspirin EC 81 MG tablet Take 324 mg by mouth daily.      Marland Kitchen  CALCIUM PO Take 1 tablet by mouth daily.      . Cyanocobalamin (VITAMIN B 12 PO) Take 1 tablet by mouth daily.      Marland Kitchen doxycycline (VIBRA-TABS) 100 MG tablet Take 1 tablet (100 mg total) by mouth every 12 (twelve) hours.  20 tablet  0  . esomeprazole (NEXIUM) 40 MG capsule Take 40 mg by mouth daily before breakfast.      . furosemide (LASIX) 20 MG tablet Take 1 tablet (20 mg total) by mouth daily at 6 PM.  30 tablet  5  . furosemide (LASIX) 40 MG tablet Take 1 tablet (40 mg total) by mouth daily.  30 tablet  5  . gabapentin (NEURONTIN) 300 MG capsule Take 1 capsule (300 mg total) by mouth 2 (two) times daily.      Marland Kitchen glimepiride (AMARYL) 4 MG tablet Take 4 mg by mouth 2 (two) times daily.      Marland Kitchen levothyroxine (SYNTHROID, LEVOTHROID) 50 MCG tablet Take 50 mcg by mouth every other day.      . meclizine (ANTIVERT) 25 MG tablet Take 25 mg by mouth 4 (four) times daily as needed. For dizziness      . metoprolol tartrate (LOPRESSOR) 25 MG tablet Take 25 mg by mouth 2 (two) times daily.      . Multiple Vitamin (MULITIVITAMIN WITH MINERALS) TABS Take 1 tablet by mouth daily.      Vladimir Faster Glycol-Propyl Glycol (SYSTANE OP) Apply 1 drop to eye 2 (two) times daily.      . polysaccharide iron (NIFEREX) 150 MG CAPS capsule Take 1 capsule (150 mg total) by mouth daily.  30 each  0  . rosuvastatin (CRESTOR) 10 MG tablet Take 1 tablet (10 mg total) by mouth daily at 6 PM.  30 tablet  1  . sitaGLIPtan-metformin (JANUMET) 50-1000 MG per tablet Take 1 tablet by mouth 2 (two) times daily with a meal.      . VITAMIN D, CHOLECALCIFEROL, PO Take 2 tablets  by mouth daily.        Results for orders placed during the hospital encounter of 01/03/12 (from the past 48 hour(s))  GLUCOSE, CAPILLARY     Status: Normal   Collection Time   01/03/12  4:22 PM      Component Value Range Comment   Glucose-Capillary 99  70 - 99 (mg/dL)    Comment 1 Documented in Chart      Comment 2 Notify RN      No results found.  ROS: General:nausea and vomiting the last couple of days with decreased urinary output, feeling very weak Skin:no rashes or ulcers HEENT:no blurred vision or double vision CV:no chest pain, no palpitations [id number] of breath  only if she walks very far [id number] amount of stools that she is having in the last 2 days, decreased appetite as well GU:no hematuria or dysuria decreased urinary output MS:new joint pains Neuro:no syncope no lightheadedness Endo:she is diabetic GYN: Vaginal discharge with antibiotics  Blood pressure 105/85, pulse 54, temperature 97.7 F (36.5 C), temperature source Oral, resp. rate 20, height 5\' 1"  (1.549 m), weight 57.743 kg (127 lb 4.8 oz), SpO2 99.00%. PE: General:alert oriented white female obviously does not feel well but no acute distress Skin:warm and dry prescription refill HEENT:normocephalic, sclera clear Neck:supple no JVD no carotid bruits Heart:S1-S2 regular rate and rhythm no obvious murmur Lungs:clear without rales, rhonchi or wheezing [id number], nontender, positive bowel sounds,  do not palpate, liver,  spleen or masses Ext:no  edema Neuro:alert and oriented x3 follows commands moves all extremities    Assessment/Plan Patient Active Problem List  Diagnoses  . ESOPHAGEAL STRICTURE  . GERD  . CONSTIPATION  . CONSTIPATION, CHRONIC  . SALPINGO-OOPHORITIS  . MENOPAUSE, SURGICAL  . BACK PAIN, LUMBAR  . FLATULENCE-GAS-BLOATING  . ANKLE INJURY, RIGHT  . TOTAL KNEE REPLACEMENT, LEFT, HX OF  . DIABETES MELLITUS-TYPE II  . DYSPHAGIA UNSPECIFIED  . ABDOMINAL PAIN, EPIGASTRIC  .  NSTEMI (non-ST elevated myocardial infarction)  . HTN currently poor control  . Family history of early CAD  . Hyperlipidemia LDL goal <70  . S/P CABG (coronary artery bypass graft), 12/04/11  . PAF post CABG, NSR now  . Diastolic CHF, acute, EF [id number] with grade 2 diastolic dysfunction, BNP 11k  . URI, negative for influenza  . Hyperkalemia, K+ 5.5 on Cozaar  . Acute renal failure, 01/03/12   PLAN: admit to telemetry hydrate will stop doxycycline secondary to loss of appetite that has increased significantly since she went home.  We'll hold diuretics also holds Janumet  secondary to increased creatinine.  Recheck labs in the morning.  INGOLD,LAURA R 01/03/2012, 4:52 PM  See associated note also  Sanda Klein, MD, [facility] and [place] [phone] office [phone] pager

## 2012-01-04 ENCOUNTER — Inpatient Hospital Stay (HOSPITAL_COMMUNITY): Payer: Medicare Other

## 2012-01-04 ENCOUNTER — Other Ambulatory Visit (HOSPITAL_COMMUNITY): Payer: Medicare Other

## 2012-01-04 LAB — URINALYSIS, ROUTINE W REFLEX MICROSCOPIC
Bilirubin Urine: NEGATIVE
Glucose, UA: NEGATIVE mg/dL
Hgb urine dipstick: NEGATIVE
Ketones, ur: NEGATIVE mg/dL
Nitrite: NEGATIVE
Protein, ur: NEGATIVE mg/dL
Specific Gravity, Urine: 1.015 (ref 1.005–1.030)
Urobilinogen, UA: 0.2 mg/dL (ref 0.0–1.0)
pH: 5 (ref 5.0–8.0)

## 2012-01-04 LAB — GLUCOSE, CAPILLARY
Glucose-Capillary: 117 mg/dL — ABNORMAL HIGH (ref 70–99)
Glucose-Capillary: 119 mg/dL — ABNORMAL HIGH (ref 70–99)
Glucose-Capillary: 140 mg/dL — ABNORMAL HIGH (ref 70–99)
Glucose-Capillary: 154 mg/dL — ABNORMAL HIGH (ref 70–99)
Glucose-Capillary: 169 mg/dL — ABNORMAL HIGH (ref 70–99)
Glucose-Capillary: 39 mg/dL — CL (ref 70–99)

## 2012-01-04 LAB — BASIC METABOLIC PANEL
BUN: 98 mg/dL — ABNORMAL HIGH (ref 6–23)
CO2: 19 mEq/L (ref 19–32)
Calcium: 8.5 mg/dL (ref 8.4–10.5)
Chloride: 97 mEq/L (ref 96–112)
Creatinine, Ser: 4.93 mg/dL — ABNORMAL HIGH (ref 0.50–1.10)
GFR calc Af Amer: 9 mL/min — ABNORMAL LOW (ref >90)
GFR calc non Af Amer: 8 mL/min — ABNORMAL LOW (ref >90)
Glucose, Bld: 47 mg/dL — ABNORMAL LOW (ref 70–99)
Potassium: 5.8 mEq/L — ABNORMAL HIGH (ref 3.5–5.1)
Sodium: 133 mEq/L — ABNORMAL LOW (ref 135–145)

## 2012-01-04 LAB — URINE MICROSCOPIC-ADD ON

## 2012-01-04 LAB — CREATININE, URINE, RANDOM: Creatinine, Urine: 94.53 mg/dL

## 2012-01-04 LAB — NA AND K (SODIUM & POTASSIUM), RAND UR
Potassium Urine: 17 meq/L
Sodium, Ur: 48 meq/L

## 2012-01-04 LAB — MAGNESIUM: Magnesium: 1.4 mg/dL — ABNORMAL LOW (ref 1.5–2.5)

## 2012-01-04 LAB — SODIUM, URINE, RANDOM: Sodium, Ur: 39 meq/L

## 2012-01-04 MED ORDER — SODIUM POLYSTYRENE SULFONATE 15 GM/60ML PO SUSP
30.0000 g | Freq: Once | ORAL | Status: AC
  Administered 2012-01-04: 15 g via ORAL
  Filled 2012-01-04: qty 120

## 2012-01-04 MED ORDER — GABAPENTIN 100 MG PO CAPS
100.0000 mg | ORAL_CAPSULE | Freq: Two times a day (BID) | ORAL | Status: DC
  Administered 2012-01-04 – 2012-01-07 (×6): 100 mg via ORAL
  Filled 2012-01-04 (×8): qty 1

## 2012-01-04 MED ORDER — METOPROLOL TARTRATE 25 MG PO TABS
25.0000 mg | ORAL_TABLET | Freq: Two times a day (BID) | ORAL | Status: DC
  Administered 2012-01-04 – 2012-01-06 (×5): 25 mg via ORAL
  Filled 2012-01-04 (×7): qty 1

## 2012-01-04 MED ORDER — DEXTROSE 50 % IV SOLN
INTRAVENOUS | Status: AC
  Administered 2012-01-04: 25 mL
  Filled 2012-01-04: qty 50

## 2012-01-04 NOTE — Progress Notes (Signed)
The patient is hospitalized with acute renal failure which is felt to be due to acute dehydration with continued use of diuretics. The patient had CABG x34 weeks ago and has has had a uneventful recovery other than her current renal insufficiency. Her postoperative transient atrial fibrillation has not recurred and she is maintaining sinus rhythm. She has had no recurrent symptoms of angina and the surgical incisions are well-healed. Her chest x-ray shows no significant pleural effusion at this time and the cardiac silhouette is normal.  On exam her heart rhythm is regular, she has no murmur or friction rub and breath sounds are clear. The sternal incision is stable well-healed. The right leg incision is well-healed. There is no pedal edema.  I told the patient we would contact her for followup office appointment in 2 weeks after she Is recovered from her renal insufficiency. She knows that she can gradually increase her activity level after discharge to home but she should not lift more than 15 pounds until 3 months after surgery. We will followup her outpatient cardiac rehabilitation referral which she should be L. to start of the next 2-3 weeks after her renal insufficiency resolves.[name]

## 2012-01-04 NOTE — Progress Notes (Signed)
Pt will only take 15g/54ml of Sodium polystyrene . Dose ordered 30g/155ml. States she was up all night last night going to the bathroom and that she is "very susceptible" to medicines like these.

## 2012-01-04 NOTE — Progress Notes (Signed)
CBG: 39  Treatment: D50 IV 25 mL  Symptoms:none None  Follow-up CBG: Time:0750 CBG Result:119  Possible Reasons for Event:inadequate meal intake, npo overnight Inadequate meal intake  Comments/MD notified:yes    Kendal Raffo S

## 2012-01-04 NOTE — Consult Note (Signed)
[facility] Consult Note [facility]  Date: 01/04/2012  Patient name: [name] Medical record number: [id number] Date of birth: 12/02/1940 Age: 71 y.o. Gender: female PCP: Haywood Pao, MD, MD  Medical Service: Nephrology      Chief Complaint: Acute Renal Failure  History of Present Illness:  The patient is a 71 year old female with a history of MI s/p CABG in Feb 2013, type 2 DM, HTN, HLD, Hypothyroidism, depression who presents with the chief complaint of Acute renal failure. The patient was recently hospitalized on 12-24-11 through 12-29-11 for acute on chronic HF as well as acute bronchitis with probable CAP. She was diuresed and treated with doxycycline. She was continued on Lasix and doxycycline after her discharge. The patient states she felt well for several days but then developed severe N/V starting on 01-01-2012. The patient states her N/V was made worse by her antibiotic. During this time the patient states a decrease in appetite as well as a decrease in fluid intake. She also noted a decrease in urine output stating she would only "go just a little bit." She continued to take her lasix during this time. She was seen by her cardiologist office as an outpatient visit and her lab work showed a creatinine of 4.79, BUN 91, and K of 5.9. At this time she was admitted to the hospital. Her lasix and doxycycline were discontinued and she was started on IV fluids.   Currently the pt states she is feeling well. She denies ever having any issues with her kidneys in the past. She denies any dysuria, fevers, chills, or night sweats. She states that previously she had some feelings of incomplete voiding but this has since subsided. She does endorse some generalized weakness and some dizziness upon standing but states this has been going on for several months. She continue to have a productive cough which she says is improving. She denies any CP or SOB  Meds: Medications  Prior to Admission  Medication Dose Route Frequency Provider Last Rate Last Dose  . 0.9 %  sodium chloride infusion   Intravenous Continuous Cecilie Kicks, NP 75 mL/hr at 01/04/12 0758 1,000 mL at 01/04/12 0758  . acetaminophen (TYLENOL) tablet 500 mg  500 mg Oral Q6H PRN Cecilie Kicks, NP      . aspirin EC tablet 81 mg  81 mg Oral Daily Cecilie Kicks, NP   81 mg at 01/04/12 0951  . atorvastatin (LIPITOR) tablet 20 mg  20 mg Oral q1800 Cecilie Kicks, NP   20 mg at 01/03/12 1947  . dextrose 50 % solution        25 mL at 01/03/12 2348  . dextrose 50 % solution        25 mL at 01/04/12 0746  . docusate sodium (COLACE) capsule 100 mg  100 mg Oral BID Cecilie Kicks, NP      . heparin injection 5,000 Units  5,000 Units Subcutaneous Q8H Cecilie Kicks, NP   5,000 Units at 01/04/12 1423  . insulin aspart (novoLOG) injection 0-9 Units  0-9 Units Subcutaneous TID WC Cecilie Kicks, NP      . levothyroxine (SYNTHROID, LEVOTHROID) tablet 50 mcg  50 mcg Oral QODAY Cecilie Kicks, NP   50 mcg at 01/04/12 1212  . metoprolol tartrate (LOPRESSOR) tablet 25 mg  25 mg Oral BID Windy Kalata, MD      . ondansetron [facility]) tablet 4 mg  4 mg Oral Q6H PRN Cecilie Kicks, NP  Or  . ondansetron (ZOFRAN) injection 4 mg  4 mg Intravenous Q6H PRN Cecilie Kicks, NP      . pantoprazole (PROTONIX) EC tablet 80 mg  80 mg Oral Q1200 Cecilie Kicks, NP   80 mg at 01/04/12 1213  . sodium polystyrene (KAYEXALATE) 15 GM/60ML suspension 15 g  15 g Oral Once Cecilie Kicks, NP   15 g at 01/03/12 1948  . DISCONTD: amLODipine (NORVASC) tablet 10 mg  10 mg Oral Daily Cecilie Kicks, NP   10 mg at 01/04/12 0951  . DISCONTD: aspirin EC tablet 324 mg  324 mg Oral Daily Cecilie Kicks, NP      . DISCONTD: doxycycline (VIBRA-TABS) tablet 100 mg  100 mg Oral Q12H Cecilie Kicks, NP      . DISCONTD: gabapentin (NEURONTIN) capsule 300 mg  300 mg Oral BID Cecilie Kicks, NP   300 mg at 01/04/12 0951  . DISCONTD: insulin aspart (novoLOG) injection 0-5 Units   0-5 Units Subcutaneous QHS Cecilie Kicks, NP      . DISCONTD: metoprolol tartrate (LOPRESSOR) tablet 25 mg  25 mg Oral BID Cecilie Kicks, NP   25 mg at 01/04/12 0951  . DISCONTD: polysaccharide iron (NIFEREX) capsule 150 mg  150 mg Oral Daily Cecilie Kicks, NP       Medications Prior to Admission  Medication Sig Dispense Refill  . acetaminophen (TYLENOL) 500 MG tablet Take 500 mg by mouth every 6 (six) hours as needed. pain      . Ascorbic Acid (VITAMIN C PO) Take 1 tablet by mouth daily.      Marland Kitchen CALCIUM PO Take 1 tablet by mouth daily.      . Cyanocobalamin (VITAMIN B 12 PO) Take 1 tablet by mouth daily.      Marland Kitchen esomeprazole (NEXIUM) 40 MG capsule Take 40 mg by mouth daily before breakfast.      . glimepiride (AMARYL) 4 MG tablet Take 4 mg by mouth 2 (two) times daily.      Marland Kitchen levothyroxine (SYNTHROID, LEVOTHROID) 50 MCG tablet Take 50 mcg by mouth every other day.      . meclizine (ANTIVERT) 25 MG tablet Take 25 mg by mouth 4 (four) times daily as needed. For dizziness      . metoprolol tartrate (LOPRESSOR) 25 MG tablet Take 25 mg by mouth 2 (two) times daily.      . Multiple Vitamin (MULITIVITAMIN WITH MINERALS) TABS Take 1 tablet by mouth daily.      Vladimir Faster Glycol-Propyl Glycol (SYSTANE OP) Apply 1 drop to eye 2 (two) times daily.      Marland Kitchen VITAMIN D, CHOLECALCIFEROL, PO Take 2 tablets by mouth daily.        Allergies: Epinephrine Past Medical History  Diagnosis Date  . Diabetes mellitus   . Hypertension   . GERD (gastroesophageal reflux disease)   . Neuropathy   . PONV (postoperative nausea and vomiting)   . High cholesterol   . Diabetic neuropathy   . Angina   . Myocardial infarction 12/02/11    "they think I might have had a light heart attack this wk"  . Pneumonia   . Bronchitis   . Hypothyroidism   . History of stomach ulcers 1970's  . Arthritis     "in my hands"  . Depression   . S/P CABG (coronary artery bypass graft), 12/04/11 12/07/2011  . Acute renal failure, 01/03/12  01/03/2012   Past Surgical History  Procedure Date  . Fracture surgery     "  put pins  both side right ankle"  . Total knee arthroplasty ~ 2006    left  . Joint replacement   . Back surgery 2006    "cyst growing near my spine"  . Tonsillectomy 1949  . Abdominal hysterectomy 1980's  . Dilation and curettage of uterus     "a couple times"  . Cesarean section 1977  . Cataract extraction w/ intraocular lens  implant, bilateral ~ 2010  . Cardiac catheterization 12/02/11  . Coronary artery bypass graft 12/04/2011    Procedure: CORONARY ARTERY BYPASS GRAFTING (CABG);  Surgeon: Tharon Aquas Adelene Idler, MD;  Location: [facility];  Service: Open Heart Surgery;  Laterality: N/A;  CABG x three,  using left internal mammary artery, and right leg greater saphenous vein harvested endoscopically   History reviewed. No pertinent family history. History   Social History  . Marital Status: Widowed    Spouse Name: N/A    Number of Children: N/A  . Years of Education: N/A   Occupational History  . Not on file.   Social History Main Topics  . Smoking status: Never Smoker   . Smokeless tobacco: Never Used  . Alcohol Use: No  . Drug Use: No  . Sexually Active: No   Other Topics Concern  . Not on file   Social History Narrative  . No narrative on file   Review of Systems: As stated in HPI Skin:no rashes or ulcers  HEENT:no blurred vision or double vision CV:no chest pain, no palpitations [id number] of breath only if she walks very far [id number] amount of stools that she is having in the last 2 days, decreased appetite as well GU:no hematuria or dysuria  MS:new joint pains Neuro:no syncope no lightheadedness   Physical Exam: Blood pressure 104/52, pulse 59, temperature 98.5 F (36.9 C), temperature source Oral, resp. rate 16, height 5\' 1"  (1.549 m), weight 58.6 kg (129 lb 3 oz), SpO2 100.00%.  General:alert oriented white female resting comfortably in bed in no acute distress  Skin:warm  and dry prescription refill  HEENT:normocephalic, sclera clear , PERRLA, Mucus membranes appear dry, Neck:supple no JVD no carotid bruits  Heart:S1-S2 regular rate and rhythm no obvious murmur  Lungs:clear without rales, rhonchi or wheezing  [id number], nontender, positive bowel sounds, questionable CVA tenderness on the right Ext:no edema, cyanosis or clubbing Neuro:alert and oriented x3 follows commands moves all extremities   Lab results: Basic Metabolic Panel:  Lab [id number] 01/03/12 1620 12/29/11 0650  NA 133* 136 133*  K 5.8* 5.9* 5.5*  CL 97 95* 98  CO2 19 21 23   GLUCOSE 47* 93 101*  BUN 98* 98* 56*  CREATININE 4.93* 4.83* 1.40*  CALCIUM 8.5 10.0 9.2  ALB -- -- --  PHOS -- 6.3* --   Liver Function Tests:  Lab 01/03/12 1620  AST 19  ALT 9  ALKPHOS 98  BILITOT 0.4  PROT 7.6  ALBUMIN 3.8   CBC:  Lab 01/03/12 1620  WBC 16.8*  NEUTROABS --  HGB 12.2  HCT 35.8*  MCV 92.3  PLT 410*   CBG:  Lab 01/04/12 1118 01/04/12 0807 01/04/12 0742 01/04/12 0011 01/03/12 2317  GLUCAP 117* 119* 39* 169* 63*    Studies/Results: US Renal  01/04/2012  *RADIOLOGY REPORT*  Clinical Data: Acute renal failure.  RENAL/URINARY TRACT ULTRASOUND COMPLETE  Comparison:  None.  Findings:  Right Kidney:  Normal.  10.2 cm in length.  Left Kidney:  Normal.   10.3 cm in length.  Bladder:  Normal.  IMPRESSION: Normal exam.  Original Report Authenticated By: Larey Seat, M.D.   Portable Chest 1 View  01/03/2012  *RADIOLOGY REPORT*  Clinical Data: Follow-up pneumonia, CHF.  PORTABLE CHEST - 1 VIEW  Comparison: 12/26/2011  Findings: The patient has had median sternotomy and CABG.  Heart is mildly enlarged.  There are linear, streaky densities at the left lung base, most consistent with atelectasis or scarring.  There are no focal focal consolidations or pleural effusions.  No edema.  IMPRESSION:  1.  Cardiomegaly. 2.  Improving aeration.  Persistent left scar or atelectasis.  Original Report  Authenticated By: Glenice Bow, M.D.    Urinalysis   Component Value Date/Time  COLORURINE YELLOW 01/04/2012 1105  APPEARANCEUR CLOUDY* 01/04/2012 1105  LABSPEC 1.015 01/04/2012 1105  PHURINE 5.0 01/04/2012 1105  GLUCOSEU NEGATIVE 01/04/2012 1105  HGBUR NEGATIVE 01/04/2012 1105  BILIRUBINUR NEGATIVE 01/04/2012 1105  KETONESUR NEGATIVE 01/04/2012 1105  PROTEINUR NEGATIVE 01/04/2012 1105  UROBILINOGEN 0.2 01/04/2012 1105  NITRITE NEGATIVE 01/04/2012 1105  LEUKOCYTESUR MODERATE* 01/04/2012 1105   Assessment & Plan by Problem: 1. Acute Renal Failure - This is most likely due to dehydration caused by N/V, decreased fluid intake, and continued use of diuretics. UA and renal US were negative. Continue IV fluids. Will decrease gabapentin dose to 100mg  TID. Continue to hold diuretics. Continue to monitor I&O. Repeat BMP in AM 2. Hyperkalemia- Will re-dose kayexalate today.  Continue to follow closely 3. Type 2 DM- Continue glycemic management and CARB modified diet 4. HTN- Continue current medications 5- HLD- continue atorvastatin 6. CAD- Continue ASA 7. Diastolic HF 8. Hypothyroidism- continue levothyroxine 9. GERD- continue Pantoprazole   This is a Careers information officer Note.  The care of the patient was discussed with Dr. Mercy Moore and the assessment and plan was formulated with their assistance.  Please see their note for official documentation of the patient encounter.   Signed: Gabriel Rainwater PA-S2 [facility] Consult Note [facility] I have seen and examined this patient and agree with plan.  UA is benign and renal US normal so everything points towards ARF from volume depletion due to poor PO intake and use of diuretics.  Cont IV fluids and follow renal function daily.  Will empirically decrease dose of neurontin as it is renally excreted Lariya Kinzie T,MD 01/04/2012 4:48 PM

## 2012-01-04 NOTE — Progress Notes (Signed)
Brief description:  Recent non-ST elevation myocardial infarction February 2013 resulting in coronary artery bypass grafting with a LIMA to the LAD, Vein graft to the OM, vein graft to the RCA. She reports some postop atrial fibrillation. History also includes hypertension, poorly controlled diabetes, hyperlipidemia, hypothyroidism, depression. She had returned hospitalization 12/24/2011 - 3/6/ 2013 with increase shortness of breath nausea diaphoreses, palpitations and cough and chest tightness with cough which was concerning for possible pneumonia she had been exposed to a healthcare provider with pneumonia. During most recent hospitalization diagnosed with diastolic acute on chronic  heart failure and was diuresed.  Discharged on 12/29/2011, her BNP was decreasing and during that hospitalization her ARB was DC'd secondary to hyperkalemia and her ACEI had been DC'd by pulmonary as a cause of her increased bronchospasm.  She had generally done well once she arrived back at home until last one to 2 days. She complained of nausea and vomiting with decreased urinary output. Her appetite is nonexistent. She also has had less bowel movements. She had a basic metabolic panel done through our office and her creatinine was 4.79 BUN 91 potassium 5.9sodium 135. Dr. Rex Kras recommended admission for acute renal failure with plans for hydration, DC Lasix and treating hyperkalemia.    Subjective:  DRY Pro BNP is 2769 Despite fluids at 75 cc over night, Cr. Is higher at 4.93. K= remains at 5.8, mag at 1.4 Episode of hypoglycemia this am. Objective: Vital signs in last 24 hours: Temp:  [97.3 F (36.3 C)-98.4 F (36.9 C)] 98.2 F (36.8 C) (03/12 0900) Pulse Rate:  [54-63] 63  (03/12 0900) Resp:  [17-20] 17  (03/12 0900) BP: (105-119)/(45-85) 119/52 mmHg (03/12 0900) SpO2:  [97 %-99 %] 98 % (03/12 0900) Weight:  [57.743 kg (127 lb 4.8 oz)-58.6 kg (129 lb 3 oz)] 58.6 kg (129 lb 3 oz) (03/11 2139) Weight change:    Last BM Date: 01/04/12 Intake/Output from previous day: +1169 03/11 0701 - 03/12 0700 In: 1170 [P.O.:240; I.V.:930] Out: 1 [Stool:1] Intake/Output this shift: Total I/O In: 240 [P.O.:240] Out: -   PE:  General: Skin: HEENT: Neck: Heart: Lungs: Abd: Ext: Neuro: Lab Results:  Basename 01/03/12 1620  WBC 16.8*  HGB 12.2  HCT 35.8*  PLT 410*   BMET  Basename 01/04/12 0550 01/03/12 1620  NA 133* 136  K 5.8* 5.9*  CL 97 95*  CO2 19 21  GLUCOSE 47* 93  BUN 98* 98*  CREATININE 4.93* 4.83*  CALCIUM 8.5 10.0   No results found for this basename: TROPONINI:2,CK,MB:2 in the last 72 hours  Lab Results  Component Value Date   CHOL 254* 12/02/2011   HDL 36* 12/02/2011   LDLCALC 143* 12/02/2011   TRIG 375* 12/02/2011   CHOLHDL 7.1 12/02/2011   Lab Results  Component Value Date   HGBA1C 7.3* 12/03/2011     Lab Results  Component Value Date   TSH 3.415 12/24/2011    Hepatic Function Panel  Basename 01/03/12 1620  PROT 7.6  ALBUMIN 3.8  AST 19  ALT 9  ALKPHOS 98  BILITOT 0.4  BILIDIR --  IBILI --   No results found for this basename: CHOL in the last 72 hours No results found for this basename: PROTIME in the last 72 hours    EKG: Orders placed during the hospital encounter of 01/03/12  . EKG 12-LEAD  . EKG 12-LEAD    Studies/Results: Portable Chest 1 View  01/03/2012  *RADIOLOGY REPORT*  Clinical Data: Follow-up pneumonia,  CHF.  PORTABLE CHEST - 1 VIEW  Comparison: 12/26/2011  Findings: The patient has had median sternotomy and CABG.  Heart is mildly enlarged.  There are linear, streaky densities at the left lung base, most consistent with atelectasis or scarring.  There are no focal focal consolidations or pleural effusions.  No edema.  IMPRESSION:  1.  Cardiomegaly. 2.  Improving aeration.  Persistent left scar or atelectasis.  Original Report Authenticated By: Glenice Bow, M.D.    Medications: I have reviewed the patient's current  medications. Scheduled Meds:   . amLODipine  10 mg Oral Daily  . aspirin EC  81 mg Oral Daily  . atorvastatin  20 mg Oral q1800  . dextrose      . dextrose      . docusate sodium  100 mg Oral BID  . gabapentin  300 mg Oral BID  . heparin  5,000 Units Subcutaneous Q8H  . insulin aspart  0-5 Units Subcutaneous QHS  . insulin aspart  0-9 Units Subcutaneous TID WC  . levothyroxine  50 mcg Oral QODAY  . metoprolol tartrate  25 mg Oral BID  . pantoprazole  80 mg Oral Q1200  . sodium polystyrene  15 g Oral Once  . DISCONTD: aspirin EC  324 mg Oral Daily  . DISCONTD: doxycycline  100 mg Oral Q12H  . DISCONTD: polysaccharide iron  150 mg Oral Daily   Continuous Infusions:   . sodium chloride 1,000 mL (01/04/12 0758)   PRN Meds:.acetaminophen, ondansetron (ZOFRAN) IV, ondansetron  Assessment/Plan: Patient Active Problem List  Diagnoses  . ESOPHAGEAL STRICTURE  . GERD  . CONSTIPATION  . CONSTIPATION, CHRONIC  . SALPINGO-OOPHORITIS  . MENOPAUSE, SURGICAL  . BACK PAIN, LUMBAR  . FLATULENCE-GAS-BLOATING  . ANKLE INJURY, RIGHT  . TOTAL KNEE REPLACEMENT, LEFT, HX OF  . DIABETES MELLITUS-TYPE II  . DYSPHAGIA UNSPECIFIED  . ABDOMINAL PAIN, EPIGASTRIC  . NSTEMI (non-ST elevated myocardial infarction)  . HTN currently poor control  . Family history of early CAD  . Hyperlipidemia LDL goal <70  . S/P CABG (coronary artery bypass graft), 12/04/11  . PAF post CABG, NSR now  . Diastolic CHF, acute, EF [id number] with grade 2 diastolic dysfunction, BNP 11k  . URI, negative for influenza  . Hyperkalemia, K+ 5.5 on Cozaar  . Acute renal failure, 01/03/12   PLAN: Despite IV fluids overnight Serum Cr. Has increased. Will ask Renal to see.Continue IV fluids for now.   LOS: 1 day   INGOLD,LAURA R 01/04/2012, 10:39 AM   I have seen and examined the patient along with [facility] R, NP.  I have reviewed the chart, notes and new data.  I agree with NP's note.   Staples removed from pacer site:  healing well without hematoma or signs of infection. Superficial skin tear caudal to pacing site also healing.  PLAN: DC home Pacer check in 1 month  Sanda Klein, MD, [facility] [place] [phone] 01/04/2012, 10:59 AM

## 2012-01-04 NOTE — Progress Notes (Signed)
CBG: 63  Treatment: D50 IV 25 mL  Symptoms: None  Follow-up CBG: Time:0010  CBG Result:169  Possible Reasons for Event: Inadequate meal intake  Comments/MD notified:NA    Porfirio Mylar, Micheline Chapman

## 2012-01-04 NOTE — Progress Notes (Signed)
Called the doctor per daughters request. Daughter states "that patient may have taken too much Norvasc."

## 2012-01-04 NOTE — Progress Notes (Signed)
Please note Addendum to progress note  General:Alert and oriented, no specific complaints except no appetite.  Skin:w&d, moist mucus membranes Neck:no JVD Heart:S1S2 RRR, no murmur or gallup Lungs:clear without rales, rhonchi or wheezes Abd:+ BS, soft, non tender Ext:No edema. Neuro:alert and oriented X 3, MAE.  PLAN Addendum:  Have notified Dr. Prescott Gum of admit, she has missed 2 followup appts. with him. Continued rising cr. Could be dehydration.  Have ordered renal ultrasound to rule out obstruction, urine sodium and pot.random.  Pt. rec'd 15 gm of kayexalate last pm with freq stools now resolved.  Have also d/c'd norvasc.  And hs sliding scale.

## 2012-01-04 NOTE — Progress Notes (Signed)
01/04/2012 3:36 PM  Pt daughter concerned about risks associated with placing a foley catheter to keep up with strict I&Os, and was wanting to clarify benefits vs. Risks of placing it with the doctor.  Dr. Mercy Moore notified of patient and daughter's reluctance to place foley, especially since she has had no problem voiding in the hat today and adhering to strict I&O so far.  Dr. Mercy Moore said it was ideal to place a foley to keep more accurate records, but if the patient had been doing well so far keeping up with I&O and could continue to do so, that we could leave it out.  Dr. Mercy Moore said he would be up later this afternoon to see patient as well. Maudie Mercury, RN notified of Dr. Etheleen Nicks comments and is to speak with the patient and family regarding the issue. Jennette Banker

## 2012-01-05 ENCOUNTER — Ambulatory Visit: Payer: Self-pay | Admitting: Cardiothoracic Surgery

## 2012-01-05 LAB — GLUCOSE, CAPILLARY
Glucose-Capillary: 79 mg/dL (ref 70–99)
Glucose-Capillary: 141 mg/dL — ABNORMAL HIGH (ref 70–99)
Glucose-Capillary: 299 mg/dL — ABNORMAL HIGH (ref 70–99)
Glucose-Capillary: 120 mg/dL — ABNORMAL HIGH (ref 70–99)
Glucose-Capillary: 139 mg/dL — ABNORMAL HIGH (ref 70–99)

## 2012-01-05 LAB — COMPREHENSIVE METABOLIC PANEL
ALT: 9 U/L (ref 0–35)
AST: 16 U/L (ref 0–37)
Albumin: 2.7 g/dL — ABNORMAL LOW (ref 3.5–5.2)
Alkaline Phosphatase: 69 U/L (ref 39–117)
BUN: 84 mg/dL — ABNORMAL HIGH (ref 6–23)
CO2: 21 mEq/L (ref 19–32)
Calcium: 7.9 mg/dL — ABNORMAL LOW (ref 8.4–10.5)
Chloride: 107 mEq/L (ref 96–112)
Creatinine, Ser: 4.56 mg/dL — ABNORMAL HIGH (ref 0.50–1.10)
GFR calc Af Amer: 10 mL/min — ABNORMAL LOW (ref >90)
GFR calc non Af Amer: 9 mL/min — ABNORMAL LOW (ref >90)
Glucose, Bld: 141 mg/dL — ABNORMAL HIGH (ref 70–99)
Potassium: 4.9 mEq/L (ref 3.5–5.1)
Sodium: 141 mEq/L (ref 135–145)
Total Bilirubin: 0.3 mg/dL (ref 0.3–1.2)
Total Protein: 5.7 g/dL — ABNORMAL LOW (ref 6.0–8.3)

## 2012-01-05 LAB — CBC
HCT: 28.8 % — ABNORMAL LOW (ref 36.0–46.0)
Hemoglobin: 9.6 g/dL — ABNORMAL LOW (ref 12.0–15.0)
MCH: 31 pg (ref 26.0–34.0)
MCHC: 33.3 g/dL (ref 30.0–36.0)
MCV: 92.9 fL (ref 78.0–100.0)
Platelets: 295 10*3/uL (ref 150–400)
RBC: 3.1 MIL/uL — ABNORMAL LOW (ref 3.87–5.11)
RDW: 13.8 % (ref 11.5–15.5)
WBC: 8.5 10*3/uL (ref 4.0–10.5)

## 2012-01-05 LAB — IRON AND TIBC
Iron: 95 ug/dL (ref 42–135)
Saturation Ratios: 36 % (ref 20–55)
TIBC: 266 ug/dL (ref 250–470)
UIBC: 171 ug/dL (ref 125–400)

## 2012-01-05 LAB — RETICULOCYTES
RBC.: 3.46 MIL/uL — ABNORMAL LOW (ref 3.87–5.11)
Retic Count, Absolute: 34.6 10*3/uL (ref 19.0–186.0)
Retic Ct Pct: 1 % (ref 0.4–3.1)

## 2012-01-05 LAB — PHOSPHORUS: Phosphorus: 4.8 mg/dL — ABNORMAL HIGH (ref 2.3–4.6)

## 2012-01-05 LAB — FERRITIN: Ferritin: 436 ng/mL — ABNORMAL HIGH (ref 10–291)

## 2012-01-05 NOTE — Progress Notes (Signed)
[facility]  DAILY PROGRESS NOTE   Subjective:  No events overnight. Appetite is better. She is urinating.  Objective:  Temp:  [97.7 F (36.5 C)-98.7 F (37.1 C)] 98.7 F (37.1 C) (03/13 0454) Pulse Rate:  [59-65] 63  (03/13 0454) Resp:  [16-17] 16  (03/13 0454) BP: (91-129)/(48-61) 119/60 mmHg (03/13 0454) SpO2:  [94 %-100 %] 94 % (03/13 0454) Weight:  [59.1 kg (130 lb 4.7 oz)] 59.1 kg (130 lb 4.7 oz) (03/12 2113) Weight change: 1.357 kg (2 lb 15.9 oz)  Intake/Output from previous day: 03/12 0701 - 03/13 0700 In: 2821.3 [P.O.:1075; I.V.:1746.3] Out: 840 [Urine:840]  Intake/Output from this shift:    Medications: Current Facility-Administered Medications  Medication Dose Route Frequency Provider Last Rate Last Dose  . 0.9 %  sodium chloride infusion   Intravenous Continuous Cecilie Kicks, NP 75 mL/hr at 01/04/12 0758 1,000 mL at 01/04/12 0758  . acetaminophen (TYLENOL) tablet 500 mg  500 mg Oral Q6H PRN Cecilie Kicks, NP      . aspirin EC tablet 81 mg  81 mg Oral Daily Cecilie Kicks, NP   81 mg at 01/04/12 0951  . atorvastatin (LIPITOR) tablet 20 mg  20 mg Oral q1800 Cecilie Kicks, NP   20 mg at 01/04/12 1809  . docusate sodium (COLACE) capsule 100 mg  100 mg Oral BID Cecilie Kicks, NP      . gabapentin (NEURONTIN) capsule 100 mg  100 mg Oral BID Windy Kalata, MD   100 mg at 01/04/12 2214  . heparin injection 5,000 Units  5,000 Units Subcutaneous Q8H Cecilie Kicks, NP   5,000 Units at 01/04/12 2212  . insulin aspart (novoLOG) injection 0-9 Units  0-9 Units Subcutaneous TID WC Cecilie Kicks, NP   2 Units at 01/04/12 1810  . levothyroxine (SYNTHROID, LEVOTHROID) tablet 50 mcg  50 mcg Oral QODAY Cecilie Kicks, NP   50 mcg at 01/04/12 1212  . metoprolol tartrate (LOPRESSOR) tablet 25 mg  25 mg Oral BID Windy Kalata, MD   25 mg at 01/04/12 2212  . ondansetron (ZOFRAN) tablet 4 mg  4 mg Oral Q6H PRN Cecilie Kicks, NP       Or  . ondansetron  [facility]) injection 4 mg  4 mg Intravenous Q6H PRN Cecilie Kicks, NP      . pantoprazole (PROTONIX) EC tablet 80 mg  80 mg Oral Q1200 Cecilie Kicks, NP   80 mg at 01/04/12 1213  . sodium polystyrene (KAYEXALATE) 15 GM/60ML suspension 30 g  30 g Oral Once Windy Kalata, MD   15 g at 01/04/12 1832  . DISCONTD: amLODipine (NORVASC) tablet 10 mg  10 mg Oral Daily Cecilie Kicks, NP   10 mg at 01/04/12 0951  . DISCONTD: gabapentin (NEURONTIN) capsule 300 mg  300 mg Oral BID Cecilie Kicks, NP   300 mg at 01/04/12 0951  . DISCONTD: insulin aspart (novoLOG) injection 0-5 Units  0-5 Units Subcutaneous QHS Cecilie Kicks, NP      . DISCONTD: metoprolol tartrate (LOPRESSOR) tablet 25 mg  25 mg Oral BID Cecilie Kicks, NP   25 mg at 01/04/12 [id number]    Physical Exam: General appearance: alert and no distress Neck: no adenopathy, no carotid bruit, no JVD, supple, symmetrical, trachea midline and thyroid not enlarged, symmetric, no tenderness/mass/nodules Lungs: clear to auscultation bilaterally Heart: regular rate and rhythm, S1, S2 normal, no murmur, click, rub or gallop Abdomen: soft, non-tender; bowel sounds normal; no masses,  no organomegaly Extremities: extremities normal, atraumatic, no cyanosis or edema Pulses: 2+ and symmetric  Lab Results: Results for orders placed during the hospital encounter of 01/03/12 (from the past 48 hour(s))  CBC     Status: Abnormal   Collection Time   01/03/12  4:20 PM      Component Value Range Comment   WBC 16.8 (*) 4.0 - 10.5 (K/uL)    RBC 3.88  3.87 - 5.11 (MIL/uL)    Hemoglobin 12.2  12.0 - 15.0 (g/dL)    HCT 35.8 (*) 36.0 - 46.0 (%)    MCV 92.3  78.0 - 100.0 (fL)    MCH 31.4  26.0 - 34.0 (pg)    MCHC 34.1  30.0 - 36.0 (g/dL)    RDW 13.7  11.5 - 15.5 (%)    Platelets 410 (*) 150 - 400 (K/uL)   COMPREHENSIVE METABOLIC PANEL     Status: Abnormal   Collection Time   01/03/12  4:20 PM      Component Value Range Comment   Sodium 136  135 - 145 (mEq/L)    Potassium  5.9 (*) 3.5 - 5.1 (mEq/L)    Chloride 95 (*) 96 - 112 (mEq/L)    CO2 21  19 - 32 (mEq/L)    Glucose, Bld 93  70 - 99 (mg/dL)    BUN 98 (*) 6 - 23 (mg/dL)    Creatinine, Ser 4.83 (*) 0.50 - 1.10 (mg/dL)    Calcium 10.0  8.4 - 10.5 (mg/dL)    Total Protein 7.6  6.0 - 8.3 (g/dL)    Albumin 3.8  3.5 - 5.2 (g/dL)    AST 19  0 - 37 (U/L)    ALT 9  0 - 35 (U/L)    Alkaline Phosphatase 98  39 - 117 (U/L)    Total Bilirubin 0.4  0.3 - 1.2 (mg/dL)    GFR calc non Af Amer 8 (*) >90 (mL/min)    GFR calc Af Amer 9 (*) >90 (mL/min)   PHOSPHORUS     Status: Abnormal   Collection Time   01/03/12  4:20 PM      Component Value Range Comment   Phosphorus 6.3 (*) 2.3 - 4.6 (mg/dL)   MAGNESIUM     Status: Abnormal   Collection Time   01/03/12  4:20 PM      Component Value Range Comment   Magnesium 1.4 (*) 1.5 - 2.5 (mg/dL)   GLUCOSE, CAPILLARY     Status: Normal   Collection Time   01/03/12  4:22 PM      Component Value Range Comment   Glucose-Capillary 99  70 - 99 (mg/dL)    Comment 1 Documented in Chart      Comment 2 Notify RN     PRO B NATRIURETIC PEPTIDE     Status: Abnormal   Collection Time   01/03/12  5:39 PM      Component Value Range Comment   Pro B Natriuretic peptide (BNP) 2769.0 (*) 0 - 125 (pg/mL)   GLUCOSE, CAPILLARY     Status: Abnormal   Collection Time   01/03/12  9:33 PM      Component Value Range Comment   Glucose-Capillary 47 (*) 70 - 99 (mg/dL)    Comment 1 Documented in Chart      Comment 2 Notify RN     GLUCOSE, CAPILLARY     Status: Abnormal   Collection Time   01/03/12 10:33 PM  Component Value Range Comment   Glucose-Capillary 54 (*) 70 - 99 (mg/dL)    Comment 1 Documented in Chart      Comment 2 Notify RN     GLUCOSE, CAPILLARY     Status: Abnormal   Collection Time   01/03/12 11:17 PM      Component Value Range Comment   Glucose-Capillary 63 (*) 70 - 99 (mg/dL)    Comment 1 Notify RN     GLUCOSE, CAPILLARY     Status: Abnormal   Collection Time    01/04/12 12:11 AM      Component Value Range Comment   Glucose-Capillary 169 (*) 70 - 99 (mg/dL)    Comment 1 Notify RN     BASIC METABOLIC PANEL     Status: Abnormal   Collection Time   01/04/12  5:50 AM      Component Value Range Comment   Sodium 133 (*) 135 - 145 (mEq/L)    Potassium 5.8 (*) 3.5 - 5.1 (mEq/L) HEMOLYSIS AT THIS LEVEL MAY AFFECT RESULT   Chloride 97  96 - 112 (mEq/L)    CO2 19  19 - 32 (mEq/L)    Glucose, Bld 47 (*) 70 - 99 (mg/dL)    BUN 98 (*) 6 - 23 (mg/dL)    Creatinine, Ser 4.93 (*) 0.50 - 1.10 (mg/dL)    Calcium 8.5  8.4 - 10.5 (mg/dL)    GFR calc non Af Amer 8 (*) >90 (mL/min)    GFR calc Af Amer 9 (*) >90 (mL/min)   MAGNESIUM     Status: Abnormal   Collection Time   01/04/12  5:50 AM      Component Value Range Comment   Magnesium 1.4 (*) 1.5 - 2.5 (mg/dL)   GLUCOSE, CAPILLARY     Status: Abnormal   Collection Time   01/04/12  7:42 AM      Component Value Range Comment   Glucose-Capillary 39 (*) 70 - 99 (mg/dL)   GLUCOSE, CAPILLARY     Status: Abnormal   Collection Time   01/04/12  8:07 AM      Component Value Range Comment   Glucose-Capillary 119 (*) 70 - 99 (mg/dL)   URINALYSIS, ROUTINE W REFLEX MICROSCOPIC     Status: Abnormal   Collection Time   01/04/12 11:05 AM      Component Value Range Comment   Color, Urine YELLOW  YELLOW     APPearance CLOUDY (*) CLEAR     Specific Gravity, Urine 1.015  1.005 - 1.030     pH 5.0  5.0 - 8.0     Glucose, UA NEGATIVE  NEGATIVE (mg/dL)    Hgb urine dipstick NEGATIVE  NEGATIVE     Bilirubin Urine NEGATIVE  NEGATIVE     Ketones, ur NEGATIVE  NEGATIVE (mg/dL)    Protein, ur NEGATIVE  NEGATIVE (mg/dL)    Urobilinogen, UA 0.2  0.0 - 1.0 (mg/dL)    Nitrite NEGATIVE  NEGATIVE     Leukocytes, UA MODERATE (*) NEGATIVE    SODIUM, URINE, RANDOM     Status: Normal   Collection Time   01/04/12 11:05 AM      Component Value Range Comment   Sodium, Ur 39     CREATININE, URINE, RANDOM     Status: Normal   Collection Time     01/04/12 11:05 AM      Component Value Range Comment   Creatinine, Urine 94.53     URINE MICROSCOPIC-ADD ON  Status: Abnormal   Collection Time   01/04/12 11:05 AM      Component Value Range Comment   Squamous Epithelial / LPF MANY (*) RARE     WBC, UA 3-6  <3 (WBC/hpf)    RBC / HPF 0-2  <3 (RBC/hpf)    Bacteria, UA RARE  RARE    GLUCOSE, CAPILLARY     Status: Abnormal   Collection Time   01/04/12 11:18 AM      Component Value Range Comment   Glucose-Capillary 117 (*) 70 - 99 (mg/dL)   NA AND K (SODIUM & POTASSIUM), RAND UR     Status: Normal   Collection Time   01/04/12  3:40 PM      Component Value Range Comment   Sodium, Ur 48      Potassium Urine Timed 17     GLUCOSE, CAPILLARY     Status: Abnormal   Collection Time   01/04/12  5:29 PM      Component Value Range Comment   Glucose-Capillary 154 (*) 70 - 99 (mg/dL)   GLUCOSE, CAPILLARY     Status: Abnormal   Collection Time   01/04/12  8:24 PM      Component Value Range Comment   Glucose-Capillary 140 (*) 70 - 99 (mg/dL)   GLUCOSE, CAPILLARY     Status: Normal   Collection Time   01/05/12  4:18 AM      Component Value Range Comment   Glucose-Capillary 79  70 - 99 (mg/dL)   COMPREHENSIVE METABOLIC PANEL     Status: Abnormal   Collection Time   01/05/12  5:00 AM      Component Value Range Comment   Sodium 141  135 - 145 (mEq/L)    Potassium 4.9  3.5 - 5.1 (mEq/L)    Chloride 107  96 - 112 (mEq/L)    CO2 21  19 - 32 (mEq/L)    Glucose, Bld 141 (*) 70 - 99 (mg/dL)    BUN 84 (*) 6 - 23 (mg/dL)    Creatinine, Ser 4.56 (*) 0.50 - 1.10 (mg/dL)    Calcium 7.9 (*) 8.4 - 10.5 (mg/dL)    Total Protein 5.7 (*) 6.0 - 8.3 (g/dL)    Albumin 2.7 (*) 3.5 - 5.2 (g/dL)    AST 16  0 - 37 (U/L)    ALT 9  0 - 35 (U/L)    Alkaline Phosphatase 69  39 - 117 (U/L)    Total Bilirubin 0.3  0.3 - 1.2 (mg/dL)    GFR calc non Af Amer 9 (*) >90 (mL/min)    GFR calc Af Amer 10 (*) >90 (mL/min)   CBC     Status: Abnormal   Collection Time    01/05/12  5:00 AM      Component Value Range Comment   WBC 8.5  4.0 - 10.5 (K/uL)    RBC 3.10 (*) 3.87 - 5.11 (MIL/uL)    Hemoglobin 9.6 (*) 12.0 - 15.0 (g/dL) DELTA CHECK NOTED   HCT 28.8 (*) 36.0 - 46.0 (%)    MCV 92.9  78.0 - 100.0 (fL)    MCH 31.0  26.0 - 34.0 (pg)    MCHC 33.3  30.0 - 36.0 (g/dL)    RDW 13.8  11.5 - 15.5 (%)    Platelets 295  150 - 400 (K/uL) DELTA CHECK NOTED  PHOSPHORUS     Status: Abnormal   Collection Time   01/05/12  5:00 AM  Component Value Range Comment   Phosphorus 4.8 (*) 2.3 - 4.6 (mg/dL)   GLUCOSE, CAPILLARY     Status: Abnormal   Collection Time   01/05/12  8:04 AM      Component Value Range Comment   Glucose-Capillary 141 (*) 70 - 99 (mg/dL)    Comment 1 Documented in Chart      Comment 2 Notify RN       Imaging: US Renal  01/04/2012  *RADIOLOGY REPORT*  Clinical Data: Acute renal failure.  RENAL/URINARY TRACT ULTRASOUND COMPLETE  Comparison:  None.  Findings:  Right Kidney:  Normal.  10.2 cm in length.  Left Kidney:  Normal.   10.3 cm in length.  Bladder:  Normal.  IMPRESSION: Normal exam.  Original Report Authenticated By: Larey Seat, M.D.   Portable Chest 1 View  01/03/2012  *RADIOLOGY REPORT*  Clinical Data: Follow-up pneumonia, CHF.  PORTABLE CHEST - 1 VIEW  Comparison: 12/26/2011  Findings: The patient has had median sternotomy and CABG.  Heart is mildly enlarged.  There are linear, streaky densities at the left lung base, most consistent with atelectasis or scarring.  There are no focal focal consolidations or pleural effusions.  No edema.  IMPRESSION:  1.  Cardiomegaly. 2.  Improving aeration.  Persistent left scar or atelectasis.  Original Report Authenticated By: Glenice Bow, M.D.    Assessment:  1. Active Problems: 2.  Acute renal failure, 01/03/12 3. s/p CABG 4. Normocytic anemia  Plan:  1. Renal function is slowly improving with hydration. WBC count has normalized today. Potassium is normalized. There is mild  anemia noting hydration in place. I will go ahead and check iron studies today.  Appreciate nephrology assistance and estimated timeframe of hydration and length of stay.  Time Spent Directly with Patient:  15 minutes  Length of Stay:  LOS: 2 days   [name], MD, [facility] Attending Cardiologist [facility] 01/05/2012, 8:28 AM

## 2012-01-05 NOTE — Progress Notes (Signed)
S:feels better, eating some O:BP 128/57  Pulse 59  Temp(Src) 97.5 F (36.4 C) (Oral)  Resp 20  Ht 5\' 1"  (1.549 m)  Wt 59.1 kg (130 lb 4.7 oz)  BMI 24.62 kg/m2  SpO2 100%  Intake/Output Summary (Last 24 hours) at 01/05/12 1330 Last data filed at 01/05/12 1300  Gross per 24 hour  Intake 2991.25 ml  Output   2290 ml  Net 701.25 ml   Weight change: 1.357 kg (2 lb 15.9 oz) [id number] and alert CVS:RRR Resp:clear Abd:+bs ntnd Ext:no edema NEURO:CNI  Ox3      . aspirin EC  81 mg Oral Daily  . atorvastatin  20 mg Oral q1800  . docusate sodium  100 mg Oral BID  . gabapentin  100 mg Oral BID  . heparin  5,000 Units Subcutaneous Q8H  . insulin aspart  0-9 Units Subcutaneous TID WC  . levothyroxine  50 mcg Oral QODAY  . metoprolol tartrate  25 mg Oral BID  . pantoprazole  80 mg Oral Q1200  . sodium polystyrene  30 g Oral Once  . DISCONTD: gabapentin  300 mg Oral BID  . DISCONTD: metoprolol tartrate  25 mg Oral BID   US Renal  01/04/2012  *RADIOLOGY REPORT*  Clinical Data: Acute renal failure.  RENAL/URINARY TRACT ULTRASOUND COMPLETE  Comparison:  None.  Findings:  Right Kidney:  Normal.  10.2 cm in length.  Left Kidney:  Normal.   10.3 cm in length.  Bladder:  Normal.  IMPRESSION: Normal exam.  Original Report Authenticated By: Larey Seat, M.D.   Portable Chest 1 View  01/03/2012  *RADIOLOGY REPORT*  Clinical Data: Follow-up pneumonia, CHF.  PORTABLE CHEST - 1 VIEW  Comparison: 12/26/2011  Findings: The patient has had median sternotomy and CABG.  Heart is mildly enlarged.  There are linear, streaky densities at the left lung base, most consistent with atelectasis or scarring.  There are no focal focal consolidations or pleural effusions.  No edema.  IMPRESSION:  1.  Cardiomegaly. 2.  Improving aeration.  Persistent left scar or atelectasis.  Original Report Authenticated By: Glenice Bow, M.D.   BMET    Component Value Date/Time   NA 141 01/05/2012 0500   K 4.9  01/05/2012 0500   CL 107 01/05/2012 0500   CO2 21 01/05/2012 0500   GLUCOSE 141* 01/05/2012 0500   BUN 84* 01/05/2012 0500   CREATININE 4.56* 01/05/2012 0500   CALCIUM 7.9* 01/05/2012 0500   GFRNONAA 9* 01/05/2012 0500   GFRAA 10* 01/05/2012 0500   CBC    Component Value Date/Time   WBC 8.5 01/05/2012 0500   RBC 3.10* 01/05/2012 0500   HGB 9.6* 01/05/2012 0500   HCT 28.8* 01/05/2012 0500   PLT 295 01/05/2012 0500   MCV 92.9 01/05/2012 0500   MCH 31.0 01/05/2012 0500   MCHC 33.3 01/05/2012 0500   RDW 13.8 01/05/2012 0500   LYMPHSABS 1.4 12/26/2011 1628   MONOABS 0.8 12/26/2011 1628   EOSABS 0.0 12/26/2011 1628   BASOSABS 0.1 12/26/2011 1628     Assessment:  1. [name] most likely sec to volume depletion 2. Hyperkalemia, resolved   Plan: 1. UO improving and anticipate Scr to start decreasing more rapidly tomorrow 2. Decrease IV fluids to 50cc/hr 3. Daily Scr  Kylin Genna T

## 2012-01-06 DIAGNOSIS — I10 Essential (primary) hypertension: Secondary | ICD-10-CM | POA: Diagnosis present

## 2012-01-06 DIAGNOSIS — E1159 Type 2 diabetes mellitus with other circulatory complications: Secondary | ICD-10-CM | POA: Diagnosis present

## 2012-01-06 LAB — BASIC METABOLIC PANEL
BUN: 50 mg/dL — ABNORMAL HIGH (ref 6–23)
CO2: 20 mEq/L (ref 19–32)
Calcium: 8.3 mg/dL — ABNORMAL LOW (ref 8.4–10.5)
Chloride: 112 mEq/L (ref 96–112)
Creatinine, Ser: 2.53 mg/dL — ABNORMAL HIGH (ref 0.50–1.10)
GFR calc Af Amer: 21 mL/min — ABNORMAL LOW (ref >90)
GFR calc non Af Amer: 18 mL/min — ABNORMAL LOW (ref >90)
Glucose, Bld: 96 mg/dL (ref 70–99)
Potassium: 4.3 mEq/L (ref 3.5–5.1)
Sodium: 142 mEq/L (ref 135–145)

## 2012-01-06 LAB — GLUCOSE, CAPILLARY
Glucose-Capillary: 96 mg/dL (ref 70–99)
Glucose-Capillary: 103 mg/dL — ABNORMAL HIGH (ref 70–99)
Glucose-Capillary: 304 mg/dL — ABNORMAL HIGH (ref 70–99)
Glucose-Capillary: 90 mg/dL (ref 70–99)
Glucose-Capillary: 201 mg/dL — ABNORMAL HIGH (ref 70–99)

## 2012-01-06 NOTE — Progress Notes (Signed)
UR Completed. Simmons, [place] F [phone]  

## 2012-01-06 NOTE — Progress Notes (Signed)
PA Student Daily Progress Note [facility] Subjective: The patient states she is doing well. She has no complaints. She states she hopes that she will be able to go home today. She denies any weakness or fatigue. She states some improvement in her appetite. She denies any N/V.  Objective:  Filed Vitals:   01/05/12 1400 01/05/12 2113 01/05/12 2154 01/06/12 0410  BP: 119/48 128/80  126/51  Pulse: 62 64  64  Temp: 97.6 F (36.4 C) 97.8 F (36.6 C)  98.7 F (37.1 C)  TempSrc: Oral Oral  Oral  Resp: 20 20  20   Height:      Weight:   59 kg (130 lb 1.1 oz)   SpO2: 100% 100%  97%   Physical Exam: General- Pt sitting up in chair. Awake and alert. In no acute distress. CVS- RRR, S1 and S2 distinct, no appreciable murmurs Resp- CTAB ABD- soft, non-tnender, +BS EXT- no cyanosis, clubbing or edema Neuro- Alert and oriented x3, responds appropriately   Assessment/Plan: 1. [name]- most likely sec to volume depletion, kidney function significantly improved today. Scr 2.53 today down from 4.56 yesterday. Urine output yesterday was 2300 with a net of -28.3. Would consider signing off on the patient today with close outpatient follow up to assess continued improvement in kidney function. 2. Hyperkalemia- resolved  Labs: Basic Metabolic Panel:  Lab [id number] 01/05/12 0500 01/04/12 0550 01/03/12 1620  NA 142 141 133* --  K 4.3 4.9 5.8* --  CL 112 107 97 --  CO2 20 21 19  --  GLUCOSE 96 141* 47* --  BUN 50* 84* 98* --  CREATININE 2.53* 4.56* 4.93* --  CALCIUM 8.3* 7.9* 8.5 --  ALB -- -- -- --  PHOS -- 4.8* -- 6.3*   Liver Function Tests:  Lab 01/05/12 0500 01/03/12 1620  AST 16 19  ALT 9 9  ALKPHOS 69 98  BILITOT 0.3 0.4  PROT 5.7* 7.6  ALBUMIN 2.7* 3.8   03/13 0701 - 03/14 0700 In: 2271.7 [P.O.:960; I.V.:1311.7] Out: 2300 [Urine:2300] CBC:  Lab 01/05/12 0500 01/03/12 1620  WBC 8.5 16.8*  NEUTROABS -- --  HGB 9.6* 12.2  HCT 28.8* 35.8*  MCV 92.9 92.3  PLT  295 410*   CBG:  Lab 01/06/12 0542 01/05/12 2033 01/05/12 1715 01/05/12 1211 01/05/12 0804  GLUCAP 96 139* 120* 299* 141*   Iron Studies:  Basename 01/05/12 1024  IRON 95  TIBC 266  TRANSFERRIN --  FERRITIN 436*   Studies/Results: US Renal  01/04/2012  *RADIOLOGY REPORT*  Clinical Data: Acute renal failure.  RENAL/URINARY TRACT ULTRASOUND COMPLETE  Comparison:  None.  Findings:  Right Kidney:  Normal.  10.2 cm in length.  Left Kidney:  Normal.   10.3 cm in length.  Bladder:  Normal.  IMPRESSION: Normal exam.  Original Report Authenticated By: Larey Seat, M.D.   Medications: Scheduled Meds:   . aspirin EC  81 mg Oral Daily  . atorvastatin  20 mg Oral q1800  . docusate sodium  100 mg Oral BID  . gabapentin  100 mg Oral BID  . heparin  5,000 Units Subcutaneous Q8H  . insulin aspart  0-9 Units Subcutaneous TID WC  . levothyroxine  50 mcg Oral QODAY  . metoprolol tartrate  25 mg Oral BID  . pantoprazole  80 mg Oral Q1200   Continuous Infusions:   . sodium chloride 50 mL/hr at 01/06/12 0445   PRN Meds:.acetaminophen, ondansetron (ZOFRAN) IV, ondansetron   This is  a Medical Student Note.  The care of the patient was discussed with Dr. Mercy Moore and the assessment and plan formulated with their assistance.  Please see their attached note for official documentation of the daily encounter.  Gabriel Rainwater PA-S2 01/06/2012, 8:21 AM  I have seen and examined this patient and agree with plan .  UO excellent and Scr decreased and should cont to decrease back to baseline.  Will sign off, call if further renal issues. Tane Biegler T,MD 01/06/2012 12:14 PM

## 2012-01-06 NOTE — Progress Notes (Signed)
Subjective:  Better, still weak, she has not been up much yet.  Objective:  Vital Signs in the last 24 hours: Temp:  [97.6 F (36.4 C)-98.7 F (37.1 C)] 98.7 F (37.1 C) (03/14 0410) Pulse Rate:  [61-64] 61  (03/14 1045) Resp:  [20] 20  (03/14 0410) BP: (119-137)/(48-80) 137/65 mmHg (03/14 1045) SpO2:  [97 %-100 %] 97 % (03/14 0410) Weight:  [59 kg (130 lb 1.1 oz)] 59 kg (130 lb 1.1 oz) (03/13 2154)  Intake/Output from previous day:  Intake/Output Summary (Last 24 hours) at 01/06/12 1235 Last data filed at 01/06/12 1100  Gross per 24 hour  Intake 2431.67 ml  Output   2300 ml  Net 131.67 ml    Physical Exam: General appearance: alert, cooperative and no distress Lungs: clear to auscultation bilaterally Heart: regular rate and rhythm - no M/R/G Extrem: no edema ABd: soft, non-tender, non-distended NABS    Rate: 62  Rhythm: normal sinus rhythm  Lab Results:  Basename 01/05/12 0500 01/03/12 1620  WBC 8.5 16.8*  HGB 9.6* 12.2  PLT 295 410*    Basename 01/06/12 0610 01/05/12 0500  NA 142 141  K 4.3 4.9  CL 112 107  CO2 20 21  GLUCOSE 96 141*  BUN 50* 84*  CREATININE 2.53* 4.56*   No results found for this basename: TROPONINI:2,CK,MB:2 in the last 72 hours Hepatic Function Panel  Basename 01/05/12 0500  PROT 5.7*  ALBUMIN 2.7*  AST 16  ALT 9  ALKPHOS 69  BILITOT 0.3  BILIDIR --  IBILI --   No results found for this basename: CHOL in the last 72 hours No results found for this basename: INR in the last 72 hours  Imaging: Imaging results have been reviewed  Cardiac Studies:  Assessment/Plan:   Principal Problem:  *Acute renal failure,admitted 01/03/12, after admission for diastolic chf  Active Problems:  Acute diastolic chf 3/1 to [id number]  DIABETES MELLITUS-TYPE II  S/P CABG (coronary artery bypass graft), 12/04/11  PAF post CABG, NSR now  HTN (hypertension)  Plan-discussed with Dr Mercy Moore. Home in am off diuretics for now. She will need  close follow up after discharge. She can use Lasix prn for lower extremity edema or increasing SOB.   Kerin Ransom PA-C 01/06/2012, 12:35 PM  I have seen and examined the patient along with Kerin Ransom, PA.  I have reviewed the chart, notes and new data.  I agree with Luke's note.  Key new complaints: Feeling better, still having trouble with "tasting food", lots of ?s to ask Key examination changes: essentially benign Key new findings / data: Cr improving  PLAN: Agree with no standing diuretic for d/c -- discussed PRN lasix for Wgt gain >3lb in 1 day (with AM wgts) Continue ASA, BB  & Statin for CAD/CABG  Anticipate d/c tomorrow - unless Cr worse. DM- will need to hold metformin for at lest 2-3 days post d/c until return of normal renal Fxn.    Schedule OP BMP for early next week   Marshay Slates W, M.D., M.S. [facility] [address] [place], [place]  [id number]  [phone]  01/06/2012 6:35 PM

## 2012-01-07 LAB — BASIC METABOLIC PANEL
BUN: 29 mg/dL — ABNORMAL HIGH (ref 6–23)
CO2: 21 mEq/L (ref 19–32)
Calcium: 8.5 mg/dL (ref 8.4–10.5)
Chloride: 110 mEq/L (ref 96–112)
Creatinine, Ser: 1.66 mg/dL — ABNORMAL HIGH (ref 0.50–1.10)
GFR calc Af Amer: 34 mL/min — ABNORMAL LOW (ref >90)
GFR calc non Af Amer: 30 mL/min — ABNORMAL LOW (ref >90)
Glucose, Bld: 139 mg/dL — ABNORMAL HIGH (ref 70–99)
Potassium: 4.6 mEq/L (ref 3.5–5.1)
Sodium: 141 mEq/L (ref 135–145)

## 2012-01-07 LAB — GLUCOSE, CAPILLARY: Glucose-Capillary: 136 mg/dL — ABNORMAL HIGH (ref 70–99)

## 2012-01-07 MED ORDER — GLIMEPIRIDE 1 MG PO TABS
1.0000 mg | ORAL_TABLET | Freq: Every day | ORAL | Status: DC
  Administered 2012-01-07: 1 mg via ORAL
  Filled 2012-01-07 (×2): qty 1

## 2012-01-07 MED ORDER — GABAPENTIN 100 MG PO CAPS: 100.0000 mg | ORAL_CAPSULE | Freq: Two times a day (BID) | ORAL | Status: DC

## 2012-01-07 MED ORDER — METOPROLOL TARTRATE 25 MG PO TABS
25.0000 mg | ORAL_TABLET | Freq: Every morning | ORAL | Status: DC
Start: 2012-01-07 — End: 2012-01-07
  Administered 2012-01-07: 25 mg via ORAL
  Filled 2012-01-07: qty 1

## 2012-01-07 MED ORDER — AMLODIPINE BESYLATE 5 MG PO TABS: 5.0000 mg | ORAL_TABLET | Freq: Every day | ORAL | Status: DC

## 2012-01-07 MED ORDER — AMLODIPINE BESYLATE 5 MG PO TABS
5.0000 mg | ORAL_TABLET | Freq: Every day | ORAL | Status: DC
  Administered 2012-01-07: 5 mg via ORAL
  Filled 2012-01-07: qty 1

## 2012-01-07 MED ORDER — SITAGLIPTIN PHOSPHATE 50 MG PO TABS: 50.0000 mg | ORAL_TABLET | Freq: Every day | ORAL | Status: DC

## 2012-01-07 MED ORDER — GLIMEPIRIDE 1 MG PO TABS: 1.0000 mg | ORAL_TABLET | Freq: Every day | ORAL | Status: DC

## 2012-01-07 MED ORDER — METOPROLOL TARTRATE 25 MG PO TABS: 25.0000 mg | ORAL_TABLET | Freq: Every morning | ORAL | Status: DC

## 2012-01-07 NOTE — Progress Notes (Signed)
Pt. Got d/c orders,d/c education and medications were review with pt. And her daughter.telemetry was d/c.IV was removed.Pt.'s questions were answered

## 2012-01-07 NOTE — Discharge Instructions (Signed)
Heart Failure Heart failure (HF) is a condition in which the heart has trouble pumping blood. This means your heart does not pump blood efficiently for your body to work well. In some cases of HF, fluid may back up into your lungs or you may have swelling (edema) in your lower legs. HF is a long-term (chronic) condition. It is important for you to take good care of yourself and follow your caregiver's treatment plan. CAUSES   Health conditions:   High blood pressure (hypertension) causes the heart muscle to work harder than normal. When pressure in the blood vessels is high, the heart needs to pump (contract) with more force in order to circulate blood throughout the body. High blood pressure eventually causes the heart to become stiff and weak.   Coronary artery disease (CAD) is the buildup of cholesterol and fat (plaques) in the arteries of the heart. The blockage in the arteries deprives the heart muscle of oxygen and blood. This can cause chest pain and may lead to a heart attack. High blood pressure can also contribute to CAD.   Heart attack (myocardial infarction) occurs when 1 or more arteries in the heart become blocked. The loss of oxygen damages the muscle tissue of the heart. When this happens, part of the heart muscle dies. The injured tissue does not contract as well and weakens the heart's ability to pump blood.   Abnormal heart valves can cause HF when the heart valves do not open and close properly. This makes the heart muscle pump harder to keep the blood flowing.   Heart muscle disease (cardiomyopathy or myocarditis) is damage to the heart muscle from a variety of causes. These can include drug or alcohol abuse, infections, or unknown reasons. These can increase the risk of HF.   Lung disease makes the heart work harder because the lungs do not work properly. This can cause a strain on the heart leading it to fail.   Diabetes increases the risk of HF. High blood sugar contributes  to high fat (lipid) levels in the blood. Diabetes can also cause slow damage to tiny blood vessels that carry important nutrients to the heart muscle. When the heart does not get enough oxygen and food, it can cause the heart to become weak and stiff. This leads to a heart that does not contract efficiently.   Other diseases can contribute to HF. These include abnormal heart rhythms, thyroid problems, and low blood counts (anemia).   Unhealthy lifestyle habits:   Obesity.   Smoking.   Eating foods high in fat and cholesterol.   Eating or drinking beverages high in salt.   Drug or alcohol abuse.   Lack of exercise.  SYMPTOMS  HF symptoms may vary and can be hard to detect. Symptoms may include:  Shortness of breath with activity, such as climbing stairs.   Persistent cough.   Swelling of the feet, ankles, legs, or abdomen.   Unexplained weight gain.   Difficulty breathing when lying flat.   Waking from sleep because of the need to sit up and get more air.   Rapid heartbeat.   Fatigue and loss of energy.   Feeling lightheaded or close to fainting.  DIAGNOSIS  A diagnosis of HF is based on your history, symptoms, physical examination, and diagnostic tests. Diagnostic tests for HF may include:  EKG.   Chest X-ray.   Blood tests.   Exercise stress test.   Blood oxygen test (arterial blood gas).   Evaluation   by a heart doctor (cardiologist).   Ultrasound evaluation of the heart (echocardiogram).   Heart artery test to look for blockages (angiogram).   Radioactive imaging to look at the heart (radionuclide test).  TREATMENT  Treatment is aimed at managing the symptoms of HF. Medicines, lifestyle changes, or surgical intervention may be necessary to treat HF.  Medicines to help treat HF may include:   Angiotensin-converting enzyme (ACE) inhibitors. These block the effects of a blood protein called angiotensin-converting enzyme. ACE inhibitors relax (dilate) the  blood vessels and help lower blood pressure. This decreases the workload of the heart, slows the progression of HF, and improves symptoms.   Angiotensin receptor blockers (ARBs). These medications work similar to ACE inhibitors. ARBs may be an alternative for people who cannot tolerate an ACE inhibitor.   Aldosterone antagonists. This medication helps get rid of extra fluid from your body. This lowers the volume of blood the heart has to pump.   Water pills (diuretics). Diuretics cause the kidneys to remove salt and water from the blood. The extra fluid is removed by urination. By removing extra fluid from the body, diuretics help lower the workload of the heart and help prevent fluid buildup in the lungs so breathing is easier.   Beta blockers. These prevent the heart from beating too fast and improve heart muscle strength. Beta blockers help maintain a normal heart rate, control blood pressure, and improve HF symptoms.   Digitalis. This increases the force of the heartbeat and may be helpful to people with HF or heart rhythm problems.   Healthy lifestyle changes include:   Stopping smoking.   Eating a healthy diet. Avoid foods high in fat. Avoid foods fried in oil or made with fat. A dietician can help with healthy food choices.   Limiting how much salt you eat.   Limiting alcohol intake to no more than 1 drink per day for women and 2 drinks per day for men. Drinking more than that is harmful to your heart. If your heart has already been damaged by alcohol or you have severe HF, drinking alcohol should be stopped completely.   Exercising as directed by your caregiver.   Surgical treatment for HF may include:   Procedures to open blocked arteries, repair damaged heart valves, or remove damaged heart muscle tissue.   A pacemaker to help heart muscle function and to control certain abnormal heart rhythms.   A defibrillator to possibly prevent sudden cardiac death.  HOME CARE  INSTRUCTIONS   Activity level. Your caregiver can help you determine what type of exercise program may be helpful. It is important to maintain your strength. Pace your physical activity to avoid shortness of breath or chest pain. Rest for 1 hour before and after meals. A cardiac rehabilitation program may be helpful to some people with HF.   Diet. Eat a heart healthy diet. Food choices should be low in saturated fat and cholesterol. Talk to a dietician to learn about heart healthy foods.   Salt intake. When you have HF, you need to limit the amount of salt you eat. Eat less than 1500 milligrams (mg) of salt per day or as recommended by your caregiver.   Weight monitoring. Weigh yourself every day. You should weigh yourself in the morning after you urinate and before you eat breakfast. Wear the same amount of clothing each time you weigh yourself. Record your weight daily. Bring your recorded weights to your clinic visits. Tell your caregiver right away if   you have gained 3 lb/1.4 kg in 1 day, or 5 lb/2.3 kg in a week or whatever amount you were told to report.   Blood pressure monitoring. This should be done as directed by your caregiver. A home blood pressure cuff can be purchased at a drugstore. Record your blood pressure numbers and bring them to your clinic visits. Tell your caregiver if you become dizzy or lightheaded upon standing up.   Smoking. If you are currently a smoker, it is time to quit. Nicotine makes your heart work harder by causing your blood vessels to constrict. Do not use nicotine gum or patches before talking to your caregiver.   Follow up. Be sure to schedule a follow-up visit with your caregiver. Keep all your appointments.  SEEK MEDICAL CARE IF:   Your weight increases by 3 lb/1.4 kg in 1 day or 5 lb/2.3 kg in a week.   You notice increasing shortness of breath that is unusual for you. This may happen during rest, sleep, or with activity.   You cough more than normal,  especially with physical activity.   You notice more swelling in your hands, feet, ankles, or belly (abdomen).   You are unable to sleep because it is hard to breathe.   You cough up bloody mucus (sputum).   You begin to feel "jumping" or "fluttering" sensations (palpitations) in your chest.  SEEK IMMEDIATE MEDICAL CARE IF:   You have severe chest pain or pressure which may include symptoms such as:   Pain or pressure in the arms, neck, jaw, or back.   Feeling sweaty.   Feeling sick to your stomach (nauseous).   Feeling short of breath while at rest.   Having a fast or irregular heartbeat.   You experience stroke symptoms. These symptoms include:   Facial weakness or numbness.   Weakness or numbness in an arm, leg, or on one side of your body.   Blurred vision.   Difficulty talking or thinking.   Dizziness or fainting.   Severe headache.  THESE ARE MEDICAL EMERGENCIES. Do not wait to see if the symptoms go away. Call your local emergency services (911 in [place] DO NOT drive yourself to the hospital. IMPORTANT  Make a list of every medicine, vitamin, or herbal supplement you are taking. Keep the list with you at all times. Show it to your caregiver at every visit. Keep the list up-to-date.   Ask your caregiver or pharmacist to write an explanation of each medicine you are taking. This should include:   Why you are taking it.   The possible side effects.   The best time of day to take it.   Foods to take with it or what foods to avoid.   When to stop taking it.  MAKE SURE YOU:   Understand these instructions.   Will watch your condition.   Will get help right away if you are not doing well or get worse.  Document Released: 10/11/2005 Document Revised: 09/30/2011 Document Reviewed: 01/23/2010 [employer] Patient Information 2012 [employer]. 

## 2012-01-07 NOTE — Progress Notes (Signed)
Inpatient Diabetes Program Recommendations  AACE/ADA: New Consensus Statement on Inpatient Glycemic Control (2009)  Target Ranges:  Prepandial:   less than 140 mg/dL      Peak postprandial:   less than 180 mg/dL (1-2 hours)      Critically ill patients:  140 - 180 mg/dL   Reason for Visit: Concern with discharge medications for diabetes control  Inpatient Diabetes Program Recommendations Oral Agents: Pt would like to resume her Januvia portion of her Janumet at home until she can resume her Janumet.  Would probably be a bit safer and more effective than Amaryl even at 1 mg per day.    Note: Spoke with Dr. Mercy Moore regarding discharge plan for Januva 50 mg rather than Amaryl at discharge.  Amaryl even at 1 mg may or may not be effective in controlling blood sugars.  Januvia is safer (glucose dependent) and is not metabolized by the kidneys.  Called Dr. Mercy Moore for approval and called Kerin Ransom who stated he would change the order to Januvia 50 mg per day. Thank you, Rosita Kea, RN, CNS Diabetes Coordinator [phone])

## 2012-01-07 NOTE — Progress Notes (Signed)
Subjective:  No SOB this am.  Objective:  Vital Signs in the last 24 hours: Temp:  [97.5 F (36.4 C)-98.7 F (37.1 C)] 98.7 F (37.1 C) (03/15 0600) Pulse Rate:  [57-83] 57  (03/15 0600) Resp:  [16-20] 16  (03/15 0600) BP: (137-156)/(65-74) 145/69 mmHg (03/15 0600) SpO2:  [97 %-100 %] 97 % (03/15 0600) Weight:  [59.2 kg (130 lb 8.2 oz)] 59.2 kg (130 lb 8.2 oz) (03/14 2127)  Intake/Output from previous day:  Intake/Output Summary (Last 24 hours) at 01/07/12 0846 Last data filed at 01/06/12 2100  Gross per 24 hour  Intake   1000 ml  Output   1650 ml  Net   -650 ml    Physical Exam: General appearance: alert, cooperative and no distress Lungs: decreased breath sounds Lt base Heart: regular rate and rhythm Extrem: No edema   Rate: 58  Rhythm: sinus bradycardia  Lab Results:  Basename 01/05/12 0500  WBC 8.5  HGB 9.6*  PLT 295    Basename 01/07/12 0700 01/06/12 0610  NA 141 142  K 4.6 4.3  CL 110 112  CO2 21 20  GLUCOSE 139* 96  BUN 29* 50*  CREATININE 1.66* 2.53*   No results found for this basename: TROPONINI:2,CK,MB:2 in the last 72 hours Hepatic Function Panel  Basename 01/05/12 0500  PROT 5.7*  ALBUMIN 2.7*  AST 16  ALT 9  ALKPHOS 69  BILITOT 0.3  BILIDIR --  IBILI --   No results found for this basename: CHOL in the last 72 hours No results found for this basename: INR in the last 72 hours  Imaging: Imaging results have been reviewed  Cardiac Studies:  Assessment/Plan:   Principal Problem:  *Acute renal failure,admitted 01/03/12, after admission for diastolic chf, [id number]  Active Problems:  Acute diastolic chf 3/1 to [id number]  DIABETES MELLITUS-TYPE II  S/P CABG (coronary artery bypass graft), 12/04/11  PAF post CABG, NSR now  HTN (hypertension)  Plan-She needs something for HTN as her B/P is trending up, start Norvasc. I can see her next week for BMP. Resume Glucophage once SCr WNL. Lasix prn based on wgt. She lives alone, her Dtr is  an Therapist, sports.    Kerin Ransom PA-C 01/07/2012, 8:46 AM

## 2012-01-07 NOTE — Discharge Summary (Signed)
Patient ID: [name],  MRN: [id number], DOB/AGE: 1941-08-29 71 y.o.  Admit date: 01/03/2012 Discharge date: 01/07/2012  Primary Care Provider: Dr Oswaldo Done Primary Cardiologist: Dr Debara Pickett  Discharge Diagnoses Principal Problem:  *Acute renal failure,admitted 01/03/12, after admission for diastolic chf Active Problems:  Acute diastolic chf 3/1 to [id number]  DIABETES MELLITUS-TYPE II  S/P CABG (coronary artery bypass graft), 12/04/11  PAF post CABG, NSR now  HTN (hypertension)    Procedures: None   Hospital Course:Patient is a 71 year old Caucasian female with recent non-ST elevation myocardial infarction February 2013 resulting in coronary artery bypass grafting with a LIMA to the LAD, Vein graft to the OM, vein graft to the RCA. She reports some postop atrial fibrillation. History also includes hypertension, poorly controlled diabetes, hyperlipidemia, hypothyroidism, depression. She had returned hospitalization 12/24/2011 - 3/6/ 2013 with increase shortness of breath nausea diaphoreses, palpitations and cough and chest tightness with cough which was concerning for possible pneumonia she had been exposed to a healthcare provider with pneumonia. During a recent hospitalization was felt and diastolic acute on chronic clinic heart failure and was diuresed. Pulmonary consult was also obtained they thought she had acute bronchitis with asthma and probable community-acquired pneumonia at the left base. Does have pleural effusion that was pre-existing and probably cardiogenic.  She slowly improved and was discharged on 12/29/2011, her BNP was decreasing and during that hospitalization her ARB was DC'd secondary to hyperkalemia and her ACEI had been DC'd by pulmonary as a cause of her increased bronchospasm. The patient presented to the office for follow up and was complaining of weakness and nausea. She was admitted with dehydration and acute renal injury with a SCr of 4, (1.3 at discharge 3/6) She was  admitted to telemetry and diuretics were stopped. She was rehydrated and her SCr improved to 1.6 at discharge. Dr Mercy Moore saw her in consult as well. Her Janumet/Glucophage is on hold. This may need to be resumed as an out patient pending a follow up BMP. She will be seen in the office next week. She has been instructed to call if her wgt increases more than 3lbs in 24hrs. Her dtr is an Therapist, sports and will help with monitoring her.   Discharge Vitals:  Blood pressure 145/69, pulse 57, temperature 98.7 F (37.1 C), temperature source Oral, resp. rate 16, height 5\' 1"  (1.549 m), weight 59.2 kg (130 lb 8.2 oz), SpO2 97.00%.    Labs: Results for orders placed during the hospital encounter of 01/03/12 (from the past 48 hour(s))  IRON AND TIBC     Status: Normal   Collection Time   01/05/12 10:24 AM      Component Value Range Comment   Iron 95  42 - 135 (ug/dL)    TIBC 266  250 - 470 (ug/dL)    Saturation Ratios 36  20 - 55 (%)    UIBC 171  125 - 400 (ug/dL)   FERRITIN     Status: Abnormal   Collection Time   01/05/12 10:24 AM      Component Value Range Comment   Ferritin 436 (*) 10 - 291 (ng/mL)   RETICULOCYTES     Status: Abnormal   Collection Time   01/05/12 10:24 AM      Component Value Range Comment   Retic Ct Pct 1.0  0.4 - 3.1 (%)    RBC. 3.46 (*) 3.87 - 5.11 (MIL/uL)    Retic Count, Manual 34.6  19.0 - 186.0 (K/uL)   GLUCOSE, CAPILLARY  Status: Abnormal   Collection Time   01/05/12 12:11 PM      Component Value Range Comment   Glucose-Capillary 299 (*) 70 - 99 (mg/dL)    Comment 1 Documented in Chart      Comment 2 Notify RN     GLUCOSE, CAPILLARY     Status: Abnormal   Collection Time   01/05/12  5:15 PM      Component Value Range Comment   Glucose-Capillary 120 (*) 70 - 99 (mg/dL)    Comment 1 Documented in Chart      Comment 2 Notify RN     GLUCOSE, CAPILLARY     Status: Abnormal   Collection Time   01/05/12  8:33 PM      Component Value Range Comment   Glucose-Capillary  139 (*) 70 - 99 (mg/dL)   GLUCOSE, CAPILLARY     Status: Normal   Collection Time   01/06/12  5:42 AM      Component Value Range Comment   Glucose-Capillary 96  70 - 99 (mg/dL)   BASIC METABOLIC PANEL     Status: Abnormal   Collection Time   01/06/12  6:10 AM      Component Value Range Comment   Sodium 142  135 - 145 (mEq/L)    Potassium 4.3  3.5 - 5.1 (mEq/L)    Chloride 112  96 - 112 (mEq/L)    CO2 20  19 - 32 (mEq/L)    Glucose, Bld 96  70 - 99 (mg/dL)    BUN 50 (*) 6 - 23 (mg/dL) DELTA CHECK NOTED   Creatinine, Ser 2.53 (*) 0.50 - 1.10 (mg/dL) DELTA CHECK NOTED   Calcium 8.3 (*) 8.4 - 10.5 (mg/dL)    GFR calc non Af Amer 18 (*) >90 (mL/min)    GFR calc Af Amer 21 (*) >90 (mL/min)   GLUCOSE, CAPILLARY     Status: Abnormal   Collection Time   01/06/12  7:51 AM      Component Value Range Comment   Glucose-Capillary 103 (*) 70 - 99 (mg/dL)   GLUCOSE, CAPILLARY     Status: Abnormal   Collection Time   01/06/12 11:36 AM      Component Value Range Comment   Glucose-Capillary 304 (*) 70 - 99 (mg/dL)   GLUCOSE, CAPILLARY     Status: Normal   Collection Time   01/06/12  5:13 PM      Component Value Range Comment   Glucose-Capillary 90  70 - 99 (mg/dL)   GLUCOSE, CAPILLARY     Status: Abnormal   Collection Time   01/06/12  9:19 PM      Component Value Range Comment   Glucose-Capillary 201 (*) 70 - 99 (mg/dL)   BASIC METABOLIC PANEL     Status: Abnormal   Collection Time   01/07/12  7:00 AM      Component Value Range Comment   Sodium 141  135 - 145 (mEq/L)    Potassium 4.6  3.5 - 5.1 (mEq/L)    Chloride 110  96 - 112 (mEq/L)    CO2 21  19 - 32 (mEq/L)    Glucose, Bld 139 (*) 70 - 99 (mg/dL)    BUN 29 (*) 6 - 23 (mg/dL) DELTA CHECK NOTED   Creatinine, Ser 1.66 (*) 0.50 - 1.10 (mg/dL) DELTA CHECK NOTED   Calcium 8.5  8.4 - 10.5 (mg/dL)    GFR calc non Af Amer 30 (*) >90 (mL/min)  GFR calc Af Amer 34 (*) >90 (mL/min)   GLUCOSE, CAPILLARY     Status: Abnormal   Collection Time     01/07/12  8:05 AM      Component Value Range Comment   Glucose-Capillary 136 (*) 70 - 99 (mg/dL)    Comment 1 Documented in Chart      Comment 2 Notify RN       Disposition:  Follow-up Information    Follow up with Haywood Pao, MD. Schedule an appointment as soon as possible for a visit in 2 weeks.   Contact information:   [place], [place]. [place] [id number] [phone]       Follow up with Erlene Quan, PA on 01/13/2012. (3:40)    Contact information:   [address] [place] [phone]          Discharge Medications:  Medication List  As of 01/07/2012  9:25 AM   STOP taking these medications         furosemide 20 MG tablet      sitaGLIPtan-metformin 50-1000 MG per tablet         TAKE these medications         acetaminophen 500 MG tablet   Commonly known as: TYLENOL   Take 500 mg by mouth every 6 (six) hours as needed. pain      amLODipine 5 MG tablet   Commonly known as: NORVASC   Take 1 tablet (5 mg total) by mouth daily.      aspirin EC 81 MG tablet   Take 81 mg by mouth daily.      CALCIUM PO   Take 1 tablet by mouth daily.      esomeprazole 40 MG capsule   Commonly known as: NEXIUM   Take 40 mg by mouth daily before breakfast.      gabapentin 100 MG capsule   Commonly known as: NEURONTIN   Take 1 capsule (100 mg total) by mouth 2 (two) times daily.      glimepiride 1 MG tablet   Commonly known as: AMARYL   Take 1 tablet (1 mg total) by mouth daily with breakfast.      iron polysaccharides 150 MG capsule   Commonly known as: NIFEREX   Take 150 mg by mouth daily.      levothyroxine 50 MCG tablet   Commonly known as: SYNTHROID, LEVOTHROID   Take 50 mcg by mouth every other day.      meclizine 25 MG tablet   Commonly known as: ANTIVERT   Take 25 mg by mouth 4 (four) times daily as needed. For dizziness      metoprolol tartrate 25 MG tablet    Commonly known as: LOPRESSOR   Take 1 tablet (25 mg total) by mouth every morning.      mulitivitamin with minerals Tabs   Take 1 tablet by mouth daily.      rosuvastatin 10 MG tablet   Commonly known as: CRESTOR   Take 10 mg by mouth every evening. Take at 6pm      SYSTANE OP   Apply 1 drop to eye 2 (two) times daily.      VITAMIN B 12 PO   Take 1 tablet by mouth daily.      VITAMIN C PO   Take 1 tablet by mouth daily.      VITAMIN D (CHOLECALCIFEROL) PO   Take 2 tablets by  mouth daily.            Outstanding Labs/Studies  Duration of Discharge Encounter: Greater than 30 minutes including physician time.  Angelena Form PA-C 01/07/2012 9:25 AM

## 2012-01-07 NOTE — Progress Notes (Signed)
Pt. Seen and examined. Agree with the NP/PA-C note as written.  Creatinine almost back to baseline with hydration. Instructed for prn diuretics. Will add CCB for persistent hypertension. [place] for discharge home today. F/u with nephrology and in our office next week.  [name], MD, [facility] Attending Cardiologist [facility] [place]

## 2012-01-07 NOTE — Discharge Summary (Signed)
[name], MD, [facility] Attending Cardiologist [facility] [place]

## 2012-01-18 ENCOUNTER — Other Ambulatory Visit: Payer: Self-pay | Admitting: Cardiothoracic Surgery

## 2012-01-18 DIAGNOSIS — I2510 Atherosclerotic heart disease of native coronary artery without angina pectoris: Secondary | ICD-10-CM

## 2012-01-26 ENCOUNTER — Ambulatory Visit: Payer: Self-pay | Admitting: Cardiothoracic Surgery

## 2012-01-28 ENCOUNTER — Other Ambulatory Visit: Payer: Self-pay | Admitting: Cardiothoracic Surgery

## 2012-01-28 ENCOUNTER — Ambulatory Visit: Payer: Self-pay | Admitting: Cardiothoracic Surgery

## 2012-01-28 DIAGNOSIS — I2510 Atherosclerotic heart disease of native coronary artery without angina pectoris: Secondary | ICD-10-CM

## 2012-02-02 ENCOUNTER — Ambulatory Visit (INDEPENDENT_AMBULATORY_CARE_PROVIDER_SITE_OTHER): Payer: Self-pay | Admitting: Cardiothoracic Surgery

## 2012-02-02 ENCOUNTER — Ambulatory Visit
Admission: RE | Admit: 2012-02-02 | Discharge: 2012-02-02 | Disposition: A | Discharge status: Home / Self Care | Payer: Medicare Other | Source: Ambulatory Visit | Attending: Cardiothoracic Surgery | Admitting: Cardiothoracic Surgery

## 2012-02-02 ENCOUNTER — Encounter: Payer: Self-pay | Admitting: Cardiothoracic Surgery

## 2012-02-02 VITALS — BP 126/50 | HR 55 | Resp 16 | Ht 60.0 in | Wt 124.0 lb

## 2012-02-02 DIAGNOSIS — I2510 Atherosclerotic heart disease of native coronary artery without angina pectoris: Secondary | ICD-10-CM

## 2012-02-02 DIAGNOSIS — Z09 Encounter for follow-up examination after completed treatment for conditions other than malignant neoplasm: Secondary | ICD-10-CM

## 2012-02-02 NOTE — Progress Notes (Signed)
PCP is Haywood Pao, MD, MD Referring Provider is Troy Sine, MD  Chief Complaint  Patient presents with  . Routine Post Op    CABG 12/04/11 with cxr    HPI: Patient returns for followup 2 months after multivessel bypass grafting for severe three-vessel coronary disease. She was readmitted to the hospital approximately a month ago for dehydration and prerenal azotemia. She is now doing well at home and is ready to start outpatient cardiac rehabilitation. She denies angina, she denies difficulty incisions, and she has mild problems with some numbness in her left fifth finger. Her creatinine is back to normal. She takes her blood pressure at home with a digital monitor now   Past Medical History  Diagnosis Date  . Diabetes mellitus   . Hypertension   . GERD (gastroesophageal reflux disease)   . Neuropathy   . PONV (postoperative nausea and vomiting)   . High cholesterol   . Diabetic neuropathy   . Angina   . Myocardial infarction 12/02/11    "they think I might have had a light heart attack this wk"  . Pneumonia   . Bronchitis   . Hypothyroidism   . History of stomach ulcers 1970's  . Arthritis     "in my hands"  . Depression   . S/P CABG (coronary artery bypass graft), 12/04/11 12/07/2011  . Acute renal failure, 01/03/12 01/03/2012    Past Surgical History  Procedure Date  . Fracture surgery     "put pins  both side right ankle"  . Total knee arthroplasty ~ 2006    left  . Joint replacement   . Back surgery 2006    "cyst growing near my spine"  . Tonsillectomy 1949  . Abdominal hysterectomy 1980's  . Dilation and curettage of uterus     "a couple times"  . Cesarean section 1977  . Cataract extraction w/ intraocular lens  implant, bilateral ~ 2010  . Cardiac catheterization 12/02/11  . Coronary artery bypass graft 12/04/2011    Procedure: CORONARY ARTERY BYPASS GRAFTING (CABG);  Surgeon: Tharon Aquas Adelene Idler, MD;  Location: [facility];  Service: Open Heart Surgery;   Laterality: N/A;  CABG x three,  using left internal mammary artery, and right leg greater saphenous vein harvested endoscopically    No family history on file.  Social History History  Substance Use Topics  . Smoking status: Never Smoker   . Smokeless tobacco: Never Used  . Alcohol Use: No    Current Outpatient Prescriptions  Medication Sig Dispense Refill  . acetaminophen (TYLENOL) 500 MG tablet Take 500 mg by mouth every 6 (six) hours as needed. pain      . amLODipine (NORVASC) 5 MG tablet Take 1 tablet (5 mg total) by mouth daily.  30 tablet  5  . Ascorbic Acid (VITAMIN C PO) Take 1 tablet by mouth daily.      Marland Kitchen aspirin EC 81 MG tablet Take 81 mg by mouth daily.      Marland Kitchen CALCIUM PO Take 1 tablet by mouth daily.      . Cyanocobalamin (VITAMIN B 12 PO) Take 1 tablet by mouth daily.      Marland Kitchen esomeprazole (NEXIUM) 40 MG capsule Take 40 mg by mouth daily before breakfast.      . gabapentin (NEURONTIN) 100 MG capsule Take 1 capsule (100 mg total) by mouth 2 (two) times daily.  60 capsule  5  . iron polysaccharides (NIFEREX) 150 MG capsule Take 150 mg by  mouth daily.      Marland Kitchen levothyroxine (SYNTHROID, LEVOTHROID) 50 MCG tablet Take 50 mcg by mouth every other day.      . meclizine (ANTIVERT) 25 MG tablet Take 25 mg by mouth 4 (four) times daily as needed. For dizziness      . metoprolol tartrate (LOPRESSOR) 25 MG tablet Take 1 tablet (25 mg total) by mouth every morning.      . Multiple Vitamin (MULITIVITAMIN WITH MINERALS) TABS Take 1 tablet by mouth daily.      Vladimir Faster Glycol-Propyl Glycol (SYSTANE OP) Apply 1 drop to eye 2 (two) times daily.      . rosuvastatin (CRESTOR) 10 MG tablet Take 10 mg by mouth every evening. Take at 6pm      . sitaGLIPtan-metformin (JANUMET) 50-500 MG per tablet Take 1 tablet by mouth 2 (two) times daily with a meal.      . VITAMIN D, CHOLECALCIFEROL, PO Take 2 tablets by mouth daily.      Marland Kitchen DISCONTD: amLODipine (NORVASC) 10 MG tablet Take 1 tablet (10 mg  total) by mouth daily.  30 tablet  5  . DISCONTD: furosemide (LASIX) 20 MG tablet Take 1 tablet (20 mg total) by mouth daily at 6 PM.  30 tablet  5  . DISCONTD: glimepiride (AMARYL) 1 MG tablet Take 1 tablet (1 mg total) by mouth daily with breakfast.  30 tablet  5  . DISCONTD: rosuvastatin (CRESTOR) 10 MG tablet Take 1 tablet (10 mg total) by mouth daily at 6 PM.  30 tablet  1  . DISCONTD: sitaGLIPtan-metformin (JANUMET) 50-1000 MG per tablet Take 1 tablet by mouth 2 (two) times daily with a meal.        Allergies  Allergen Reactions  . Epinephrine Other (See Comments)    Abnormal feeling. Dental exam/injection of local w/ epi.    Review of Systems poor appetite, weight down proximal a 9 pounds from preop, some constipation from iron tablets which she will stop  BP 126/50  Pulse 55  Resp 16  Ht 5' (1.524 m)  Wt 124 lb (56.246 kg)  BMI 24.22 kg/m2  SpO2 98% Physical Exam Alert and pleasant Lungs clear Rhythm regular without murmur or gallop Sternum and leg incision is well-healed No pedal edema  Diagnostic Tests: Chest x-ray clear, sternal wires intact  Impression: Stable course 2 months after multivessel bypass grafting. Return as needed                 Plan:

## 2012-02-21 ENCOUNTER — Encounter (HOSPITAL_COMMUNITY): Payer: Self-pay | Admitting: *Deleted

## 2012-02-24 ENCOUNTER — Other Ambulatory Visit: Payer: Self-pay | Admitting: Physician Assistant

## 2012-07-18 ENCOUNTER — Telehealth: Payer: Self-pay | Admitting: Internal Medicine

## 2012-07-18 NOTE — Telephone Encounter (Signed)
Left message for pt to call back  °

## 2012-07-19 NOTE — Telephone Encounter (Signed)
Left message for pt to call back  °

## 2012-07-20 NOTE — Telephone Encounter (Signed)
Pt states that she has been having problems with reflux, lots of heartburn. Pt states she used to take Nexium but it was expensive and she had to stop taking it. Pt reports a lot of bloating and gas. Requesting to be seen. Pt scheduled to see Dr. Henrene Pastor 08/18/12@9 :30am. Pt aware of appt date and time.

## 2012-08-17 ENCOUNTER — Other Ambulatory Visit: Payer: Self-pay | Admitting: Physician Assistant

## 2012-08-18 ENCOUNTER — Encounter: Payer: Self-pay | Admitting: Internal Medicine

## 2012-08-18 ENCOUNTER — Ambulatory Visit (INDEPENDENT_AMBULATORY_CARE_PROVIDER_SITE_OTHER): Payer: Medicare Other | Admitting: Internal Medicine

## 2012-08-18 VITALS — BP 110/70 | HR 54 | Ht 60.0 in | Wt 135.4 lb

## 2012-08-18 DIAGNOSIS — R143 Flatulence: Secondary | ICD-10-CM

## 2012-08-18 DIAGNOSIS — K5900 Constipation, unspecified: Secondary | ICD-10-CM

## 2012-08-18 DIAGNOSIS — R141 Gas pain: Secondary | ICD-10-CM

## 2012-08-18 DIAGNOSIS — K219 Gastro-esophageal reflux disease without esophagitis: Secondary | ICD-10-CM

## 2012-08-18 MED ORDER — OMEPRAZOLE 40 MG PO CPDR: 40.0000 mg | DELAYED_RELEASE_CAPSULE | Freq: Every day | ORAL | Status: DC

## 2012-08-18 NOTE — Patient Instructions (Addendum)
We have sent the following medications to your pharmacy for you to pick up at your convenience:  Omeprazole  We have given you some samples of Nexium for the interim

## 2012-08-18 NOTE — Progress Notes (Signed)
HISTORY OF PRESENT ILLNESS:  [name] is a 71 y.o. female with multiple medical problems including hypertension, diabetes mellitus, coronary artery disease status post coronary artery bypass grafting February 2013, hypothyroidism, hyperlipidemia, and osteoarthritis. She has been followed in this office for gastroesophageal reflux disease complicated by erosive esophagitis with peptic stricture and colon cancer screening. She presents today with a chief complaint of worsening reflux symptoms and increased intestinal gas. As well, constipation. The patient lost weight post CABG. She has gained back her weight. She has noticed worsening reflux symptoms over the past several months including pyrosis with regurgitation. No dysphagia. Her symptoms have been well-controlled with prescription PPI. However, due to cost concerns, she has been taking lansoprazole OTC most, but not all, days. No nausea or vomiting. She has noticed increased intestinal gas since her surgery. Mostly flatus. As well, her bowels have been a bit constipated. For this she takes stool softeners daily. This regulates her bowels. No bleeding or abdominal pain. Colonoscopy was performed November of 2008. This was normal except for mild sigmoid diverticulosis and internal/external hemorrhoids. Routine followup in 10 years recommended. She has had several upper endoscopies. Last upper endoscopy was performed 12/30/2010. This revealed distal esophagitis.  REVIEW OF SYSTEMS:  All non-GI ROS negative except for sinus trouble, arthritis, back pain, hearing problems, urinary leakage, shortness of breath  Past Medical History  Diagnosis Date  . Diabetes mellitus   . Hypertension   . GERD (gastroesophageal reflux disease)   . Neuropathy   . PONV (postoperative nausea and vomiting)   . High cholesterol   . Diabetic neuropathy   . Angina   . Myocardial infarction 12/02/11    "they think I might have had a light heart attack this wk"  .  Pneumonia   . Bronchitis   . Hypothyroidism   . History of stomach ulcers 1970's  . Arthritis     "in my hands"  . Depression   . S/P CABG (coronary artery bypass graft), 12/04/11 12/07/2011  . Acute renal failure, 01/03/12 01/03/2012  . Diverticulosis   . Hemorrhoids   . Esophageal stricture     Past Surgical History  Procedure Date  . Fracture surgery     "put pins  both side right ankle"  . Total knee arthroplasty ~ 2006    left  . Joint replacement   . Back surgery 2006    "cyst growing near my spine"  . Tonsillectomy 1949  . Abdominal hysterectomy 1980's  . Dilation and curettage of uterus     "a couple times"  . Cesarean section 1977  . Cataract extraction w/ intraocular lens  implant, bilateral ~ 2010  . Cardiac catheterization 12/02/11  . Coronary artery bypass graft 12/04/2011    Procedure: CORONARY ARTERY BYPASS GRAFTING (CABG);  Surgeon: Tharon Aquas Adelene Idler, MD;  Location: [place];  Service: Open Heart Surgery;  Laterality: N/A;  CABG x three,  using left internal mammary artery, and right leg greater saphenous vein harvested endoscopically    Social History [name]  reports that she has never smoked. She has never used smokeless tobacco. She reports that she does not drink alcohol or use illicit drugs.  family history includes Breast cancer in her sister; Diabetes in her mother and sister; Heart disease in her brother, father, and sister; and Lung cancer in her sister.  There is no history of Colon cancer.  Allergies  Allergen Reactions  . Epinephrine Other (See Comments)    Abnormal  feeling. Dental exam/injection of local w/ epi.       PHYSICAL EXAMINATION: Vital signs: BP 110/70  Pulse 54  Ht 5' (1.524 m)  Wt 135 lb 6.4 oz (61.417 kg)  BMI 26.44 kg/m2 General: Well-developed, well-nourished, no acute distress HEENT: Sclerae are anicteric, conjunctiva pink. Oral mucosa intact Lungs: Clear Heart: Regular Abdomen: soft, nontender, nondistended, no  obvious ascites, no peritoneal signs, normal bowel sounds. No organomegaly. Extremities: No edema Psychiatric: alert and oriented x3. Cooperative    ASSESSMENT:  #1. GERD. Deteriorated. Effective dose of PPI #2. Constipation. Functional. #3. Increased intestinal gas without alarm features   PLAN:  #1. Reflux precautions #2. Prescribed omeprazole 40 mg daily #3. Continue stool softeners for constipation #4. Discussion on intestinal gas. Probiotic recommended. #5. Repeat screening colonoscopy due around 2018. #6. GI followup when necessary

## 2012-10-19 ENCOUNTER — Other Ambulatory Visit: Payer: Self-pay | Admitting: Physician Assistant

## 2013-01-15 IMAGING — CR DG CHEST 1V PORT
1 series · 1 of 1 positions shown · non-contrast
Comparison: 12/04/2010 at 5534 hrs

CLINICAL DATA: Shortness of breath, evaluate for pneumothorax

PORTABLE CHEST - 1 VIEW

[AP]
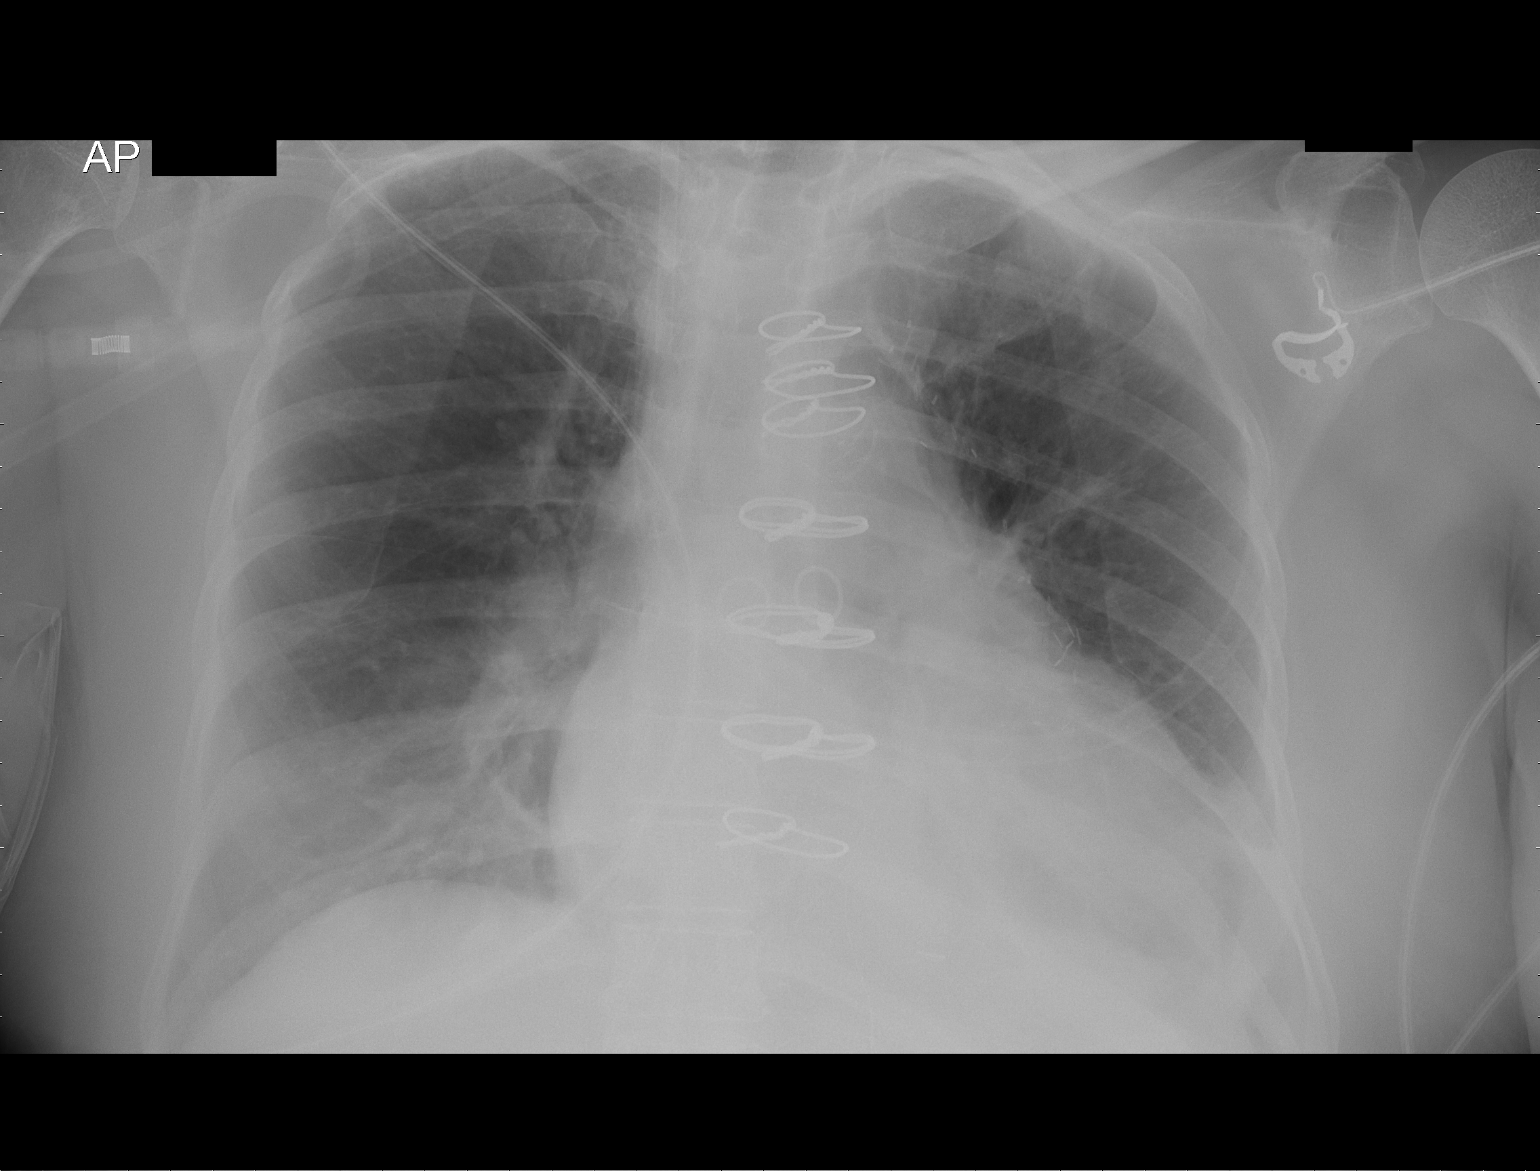

[1 of 1 positions shown; findings below may reference images not displayed]

FINDINGS: Interval removal of the left apical chest tube,
mediastinal drain, and right IJ Swan-Ganz catheter.

No pneumothorax is seen.  Suspected small bilateral pleural
effusions with left lower lobe atelectasis.

Stable right IJ venous sheath.

Mild cardiomegaly.  Postsurgical changes related to prior CABG.
IMPRESSION: Interval removal of the left apical chest tube, mediastinal drain,
and Swan-Ganz catheter.

No pneumothorax is seen.

Otherwise unchanged.

## 2013-01-15 IMAGING — CR DG CHEST 1V PORT
1 series · 1 of 1 positions shown · non-contrast
Comparison: 12/04/2011

CLINICAL DATA: Postop CABG

PORTABLE CHEST - 1 VIEW

[AP]
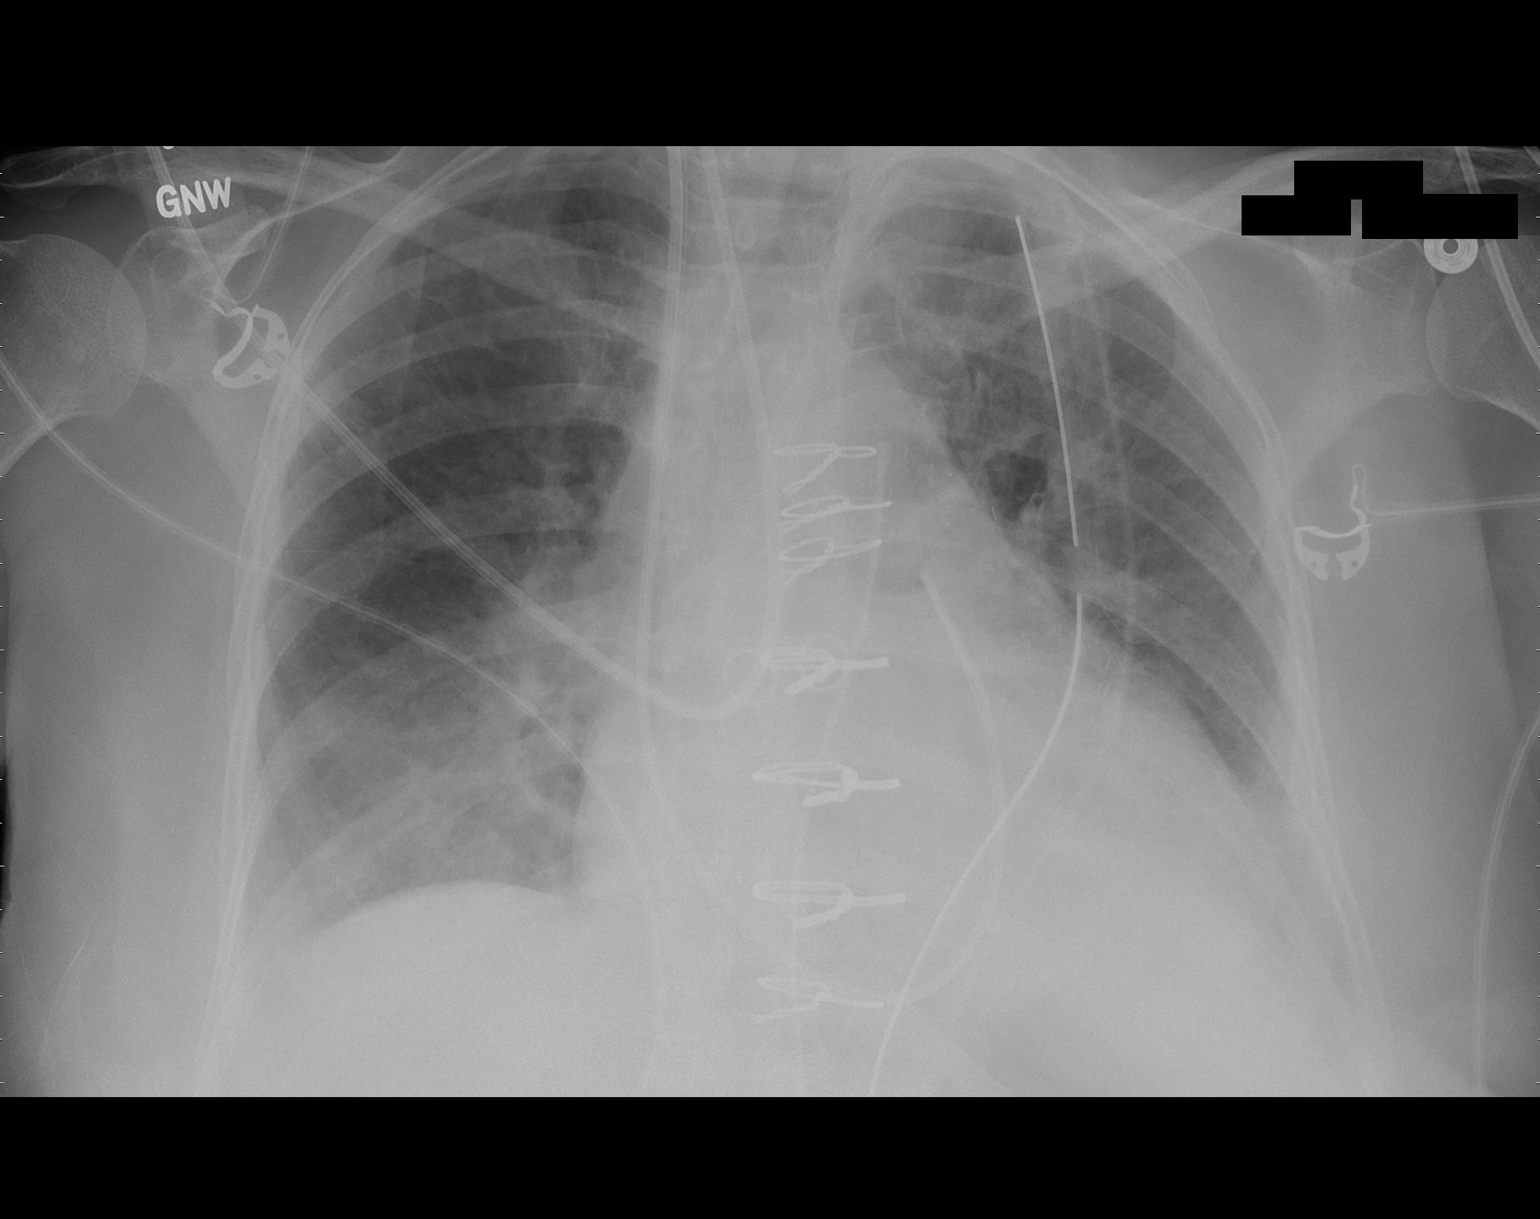

[1 of 1 positions shown; findings below may reference images not displayed]

FINDINGS: Interval extubation and removal of enteric tube.

Stable left apical chest tube and mediastinal drain.  No
pneumothorax is seen.

Stable right IJ Swan-Ganz catheter its tip in the main pulmonary
artery.

Suspected small bilateral pleural effusions.  Lungs otherwise
clear.

Heart is top normal in size. Postsurgical changes related to prior
CABG.
IMPRESSION: Interval extubation.

Stable left apical chest tube and mediastinal drain.  No
pneumothorax is seen.

## 2013-01-17 IMAGING — CR DG CHEST 2V
2 series · 2 of 2 positions shown · non-contrast
Comparison: 12/06/2011.

CLINICAL DATA: Postoperative radiograph.  CABG.

CHEST - 2 VIEW

[w chest pa]
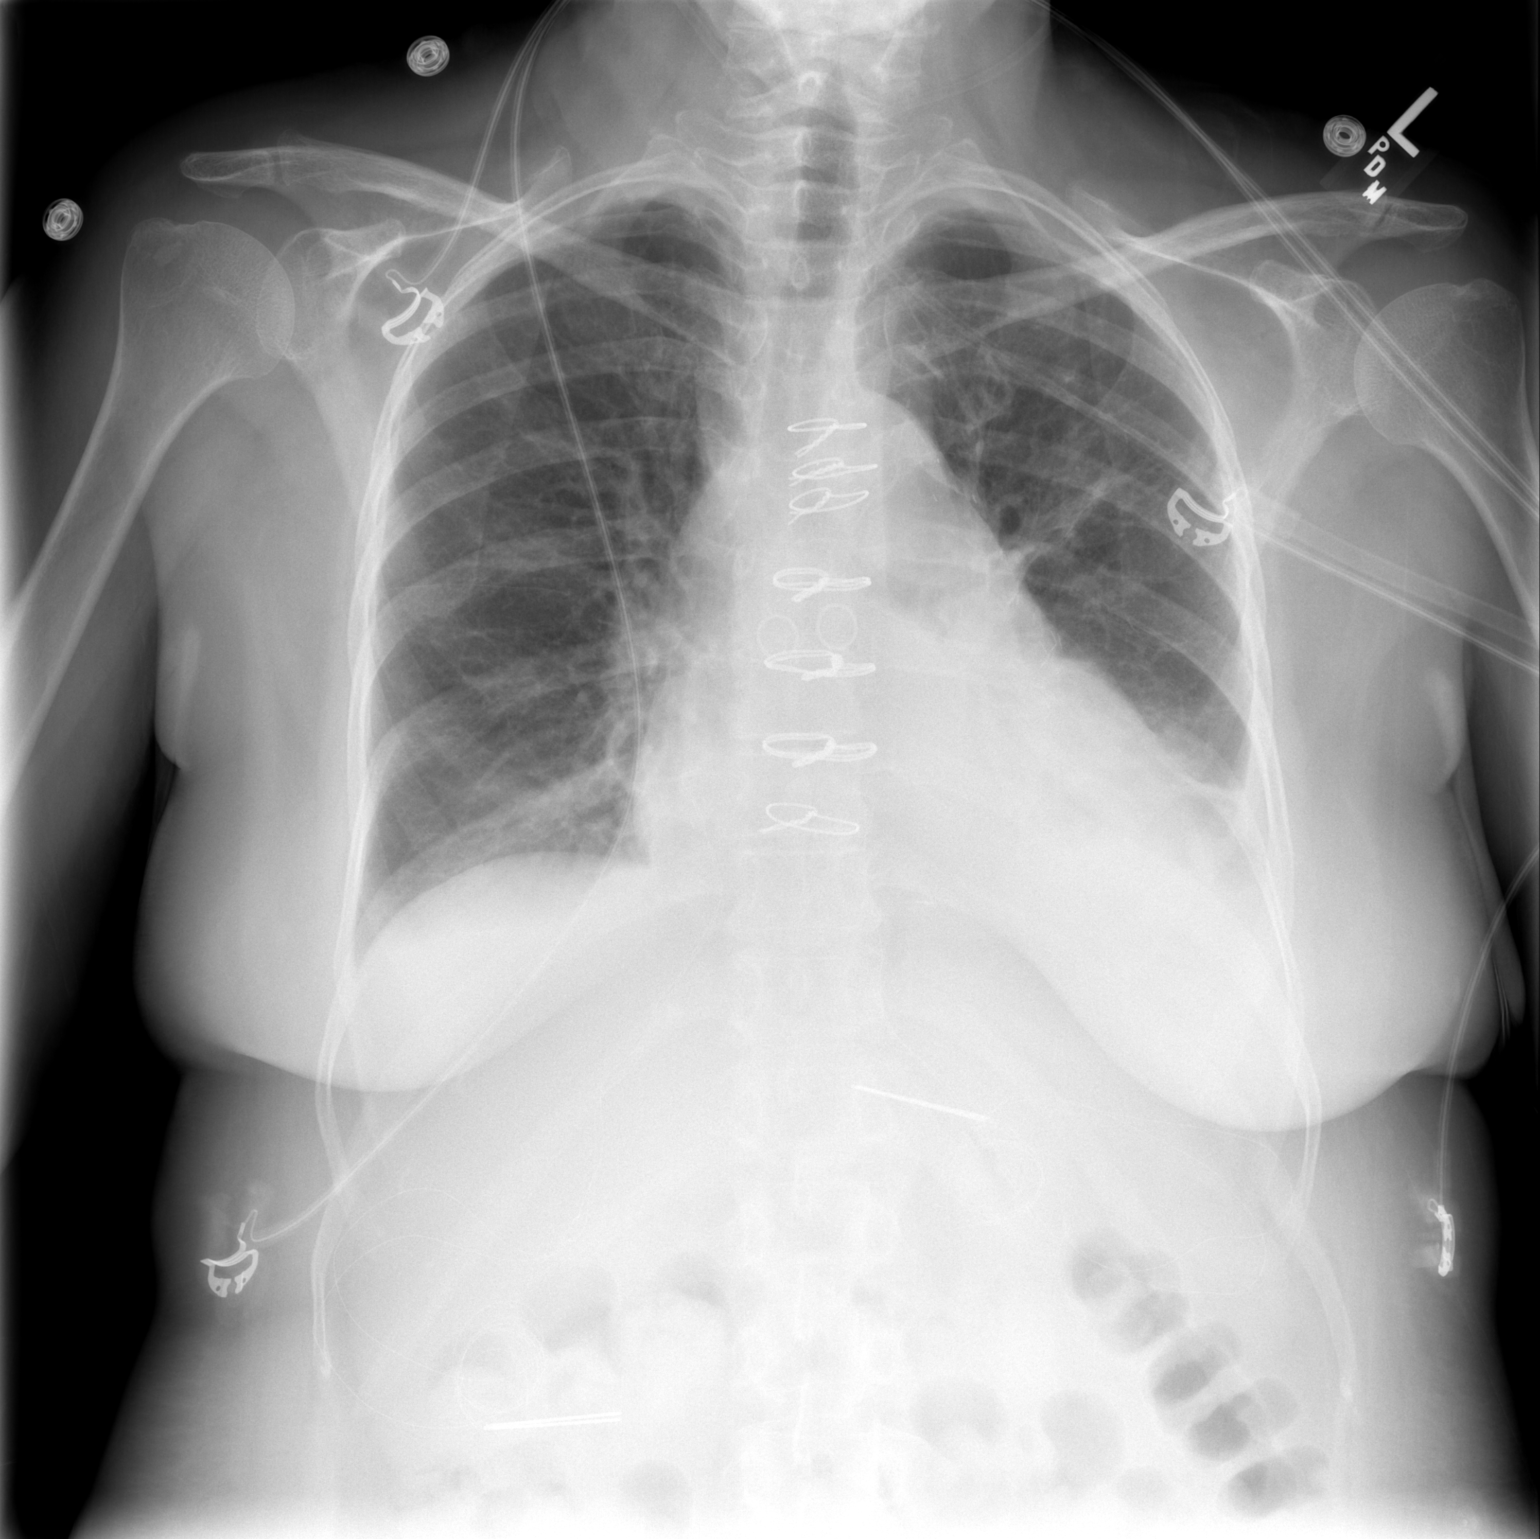

[w chest lat]
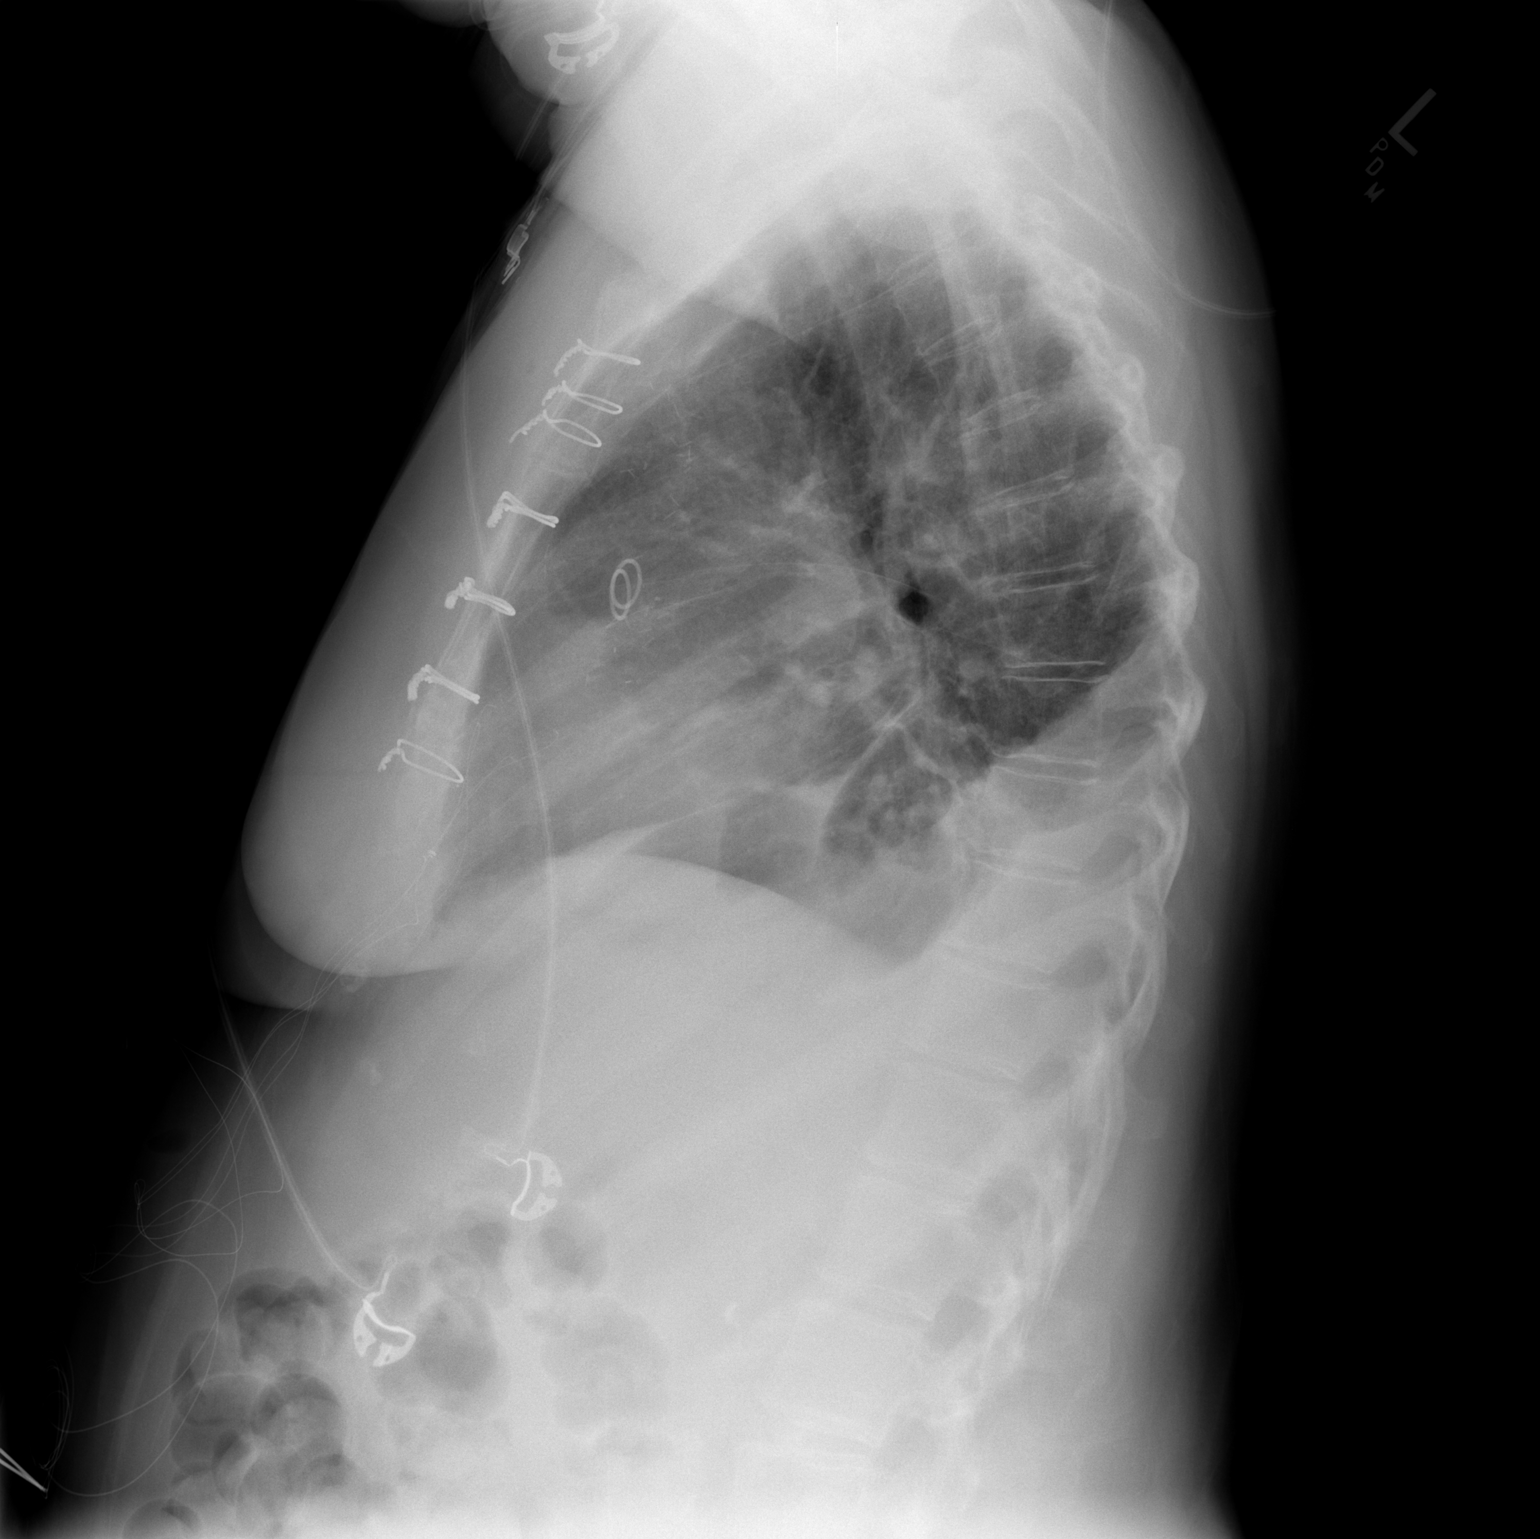

[2 of 2 positions shown; findings below may reference images not displayed]

FINDINGS: Right IJ vascular sheath removed.  Postoperative changes
of median sternotomy / CABG.  Epicardial pacing leads remain
present.  Small bilateral pleural effusions layer posteriorly.
Basilar atelectasis.  No airspace disease.  Cardiopericardial
silhouette is mildly enlarged.
IMPRESSION: Expected postoperative appearance of the chest after CABG.  Small
pleural effusions and basilar atelectasis.

## 2013-02-06 ENCOUNTER — Other Ambulatory Visit: Payer: Self-pay | Admitting: Internal Medicine

## 2013-02-06 LAB — BASIC METABOLIC PANEL
BUN: 24 mg/dL — ABNORMAL HIGH (ref 6–23)
CO2: 23 mEq/L (ref 19–32)
Calcium: 9.5 mg/dL (ref 8.4–10.5)
Chloride: 107 mEq/L (ref 96–112)
Creat: 1.16 mg/dL — ABNORMAL HIGH (ref 0.50–1.10)
Glucose, Bld: 204 mg/dL — ABNORMAL HIGH (ref 70–99)
Potassium: 4.5 mEq/L (ref 3.5–5.3)
Sodium: 139 mEq/L (ref 135–145)

## 2013-02-07 LAB — BRAIN NATRIURETIC PEPTIDE: Brain Natriuretic Peptide: 75.8 pg/mL (ref 0.0–100.0)

## 2013-02-12 ENCOUNTER — Other Ambulatory Visit (HOSPITAL_COMMUNITY): Payer: Self-pay | Admitting: Internal Medicine

## 2013-02-12 DIAGNOSIS — R609 Edema, unspecified: Secondary | ICD-10-CM

## 2013-02-12 DIAGNOSIS — R0600 Dyspnea, unspecified: Secondary | ICD-10-CM

## 2013-02-12 DIAGNOSIS — I5031 Acute diastolic (congestive) heart failure: Secondary | ICD-10-CM

## 2013-02-19 ENCOUNTER — Ambulatory Visit (HOSPITAL_COMMUNITY)
Admission: RE | Admit: 2013-02-19 | Discharge: 2013-02-19 | Disposition: A | Discharge status: Home / Self Care | Payer: Medicare Other | Source: Ambulatory Visit | Attending: Cardiovascular Disease | Admitting: Cardiovascular Disease

## 2013-02-19 DIAGNOSIS — R0600 Dyspnea, unspecified: Secondary | ICD-10-CM

## 2013-02-19 DIAGNOSIS — I5031 Acute diastolic (congestive) heart failure: Secondary | ICD-10-CM

## 2013-02-19 DIAGNOSIS — R0609 Other forms of dyspnea: Secondary | ICD-10-CM | POA: Insufficient documentation

## 2013-02-19 DIAGNOSIS — R609 Edema, unspecified: Secondary | ICD-10-CM

## 2013-02-19 DIAGNOSIS — R0989 Other specified symptoms and signs involving the circulatory and respiratory systems: Secondary | ICD-10-CM | POA: Insufficient documentation

## 2013-02-19 HISTORY — PX: TRANSTHORACIC ECHOCARDIOGRAM: SHX275

## 2013-02-19 NOTE — Progress Notes (Signed)
2D Echo Performed 02/19/2013    Marygrace Drought, RCS

## 2013-04-04 ENCOUNTER — Other Ambulatory Visit (HOSPITAL_COMMUNITY): Payer: Self-pay | Admitting: Internal Medicine

## 2013-04-18 ENCOUNTER — Encounter: Payer: Self-pay | Admitting: Internal Medicine

## 2013-04-18 ENCOUNTER — Ambulatory Visit (INDEPENDENT_AMBULATORY_CARE_PROVIDER_SITE_OTHER): Payer: Medicare Other | Admitting: Internal Medicine

## 2013-04-18 ENCOUNTER — Ambulatory Visit (HOSPITAL_COMMUNITY)
Admission: RE | Admit: 2013-04-18 | Discharge: 2013-04-18 | Disposition: A | Discharge status: Home / Self Care | Payer: Medicare Other | Source: Ambulatory Visit | Attending: Cardiovascular Disease | Admitting: Cardiovascular Disease

## 2013-04-18 VITALS — BP 150/52 | HR 60 | Ht 61.0 in | Wt 144.0 lb

## 2013-04-18 DIAGNOSIS — R609 Edema, unspecified: Secondary | ICD-10-CM | POA: Insufficient documentation

## 2013-04-18 DIAGNOSIS — I214 Non-ST elevation (NSTEMI) myocardial infarction: Secondary | ICD-10-CM

## 2013-04-18 DIAGNOSIS — E785 Hyperlipidemia, unspecified: Secondary | ICD-10-CM

## 2013-04-18 DIAGNOSIS — M7989 Other specified soft tissue disorders: Secondary | ICD-10-CM

## 2013-04-18 DIAGNOSIS — R600 Localized edema: Secondary | ICD-10-CM | POA: Insufficient documentation

## 2013-04-18 DIAGNOSIS — E119 Type 2 diabetes mellitus without complications: Secondary | ICD-10-CM

## 2013-04-18 DIAGNOSIS — L0291 Cutaneous abscess, unspecified: Secondary | ICD-10-CM

## 2013-04-18 DIAGNOSIS — L0390 Cellulitis, unspecified: Secondary | ICD-10-CM

## 2013-04-18 DIAGNOSIS — Z951 Presence of aortocoronary bypass graft: Secondary | ICD-10-CM

## 2013-04-18 DIAGNOSIS — I10 Essential (primary) hypertension: Secondary | ICD-10-CM

## 2013-04-18 MED ORDER — CEPHALEXIN 500 MG PO CAPS: ORAL_CAPSULE | ORAL | Status: DC

## 2013-04-18 NOTE — Progress Notes (Signed)
OFFICE NOTE  Chief Complaint:  Followup edema  Primary Care Physician: Haywood Pao, MD  HPI:  [name] is a pleasant 72 year old female with history of coronary disease and CABG x 3 vessels in 2013, LIMA to LAD, SVG to OM and SVG to right coronary. I saw her back in the office about 1 month ago for some increased shortness of breath, generalized fatigue and lower extremity swelling. She has had increasing weight gain and edema. She reports some fatigue especially when gardening and heaviness in her legs. She underwent an echocardiogram which demonstrates normal EF of 55% to 65%. However, there is stage 1 diastolic dysfunction and evidence for elevated mean left atrial filling pressure. There were mildly thickened calcified aortic valve leaflets but no stenosis and mild tricuspid regurgitation. At the time, I started her on Lasix 20 mg daily. A BMP was obtained that day which indicated mild elevation at 75.5, creatinine was 1.16. She has been taking the Lasix with improvement in her lower extremity swelling although shortness of breath seems to be about the same. Again, she notes it is only with marked exertion, not necessarily with normal activities. However, she does have ongoing fatigue and easy fatigability. This may be due to just poor exercise tolerance. She also reported that recently you started her on a new SLGT2 diabetic medicine and she said that she took several doses of that, which I believe was Invokana, and she became markedly dizzy with that. She has since stopped that medication. Again, overall the patient is fairly stable with a normal ejection fraction. She recently had bypass and is not having any anginal symptoms and therefore I do not suspect that this is an issue related to her bypass grafts.  She reports that she's been taking her Lasix, however her legs continued to swell. Recently she dropped a jar on her left shin and has a large knot, which appears erythematous and  in fact the entire left leg is erythematous and warm. There is asymmetric swelling of the left leg greater than the right.  PMHx:  Past Medical History  Diagnosis Date  . Diabetes mellitus   . Hypertension   . GERD (gastroesophageal reflux disease)   . Neuropathy   . PONV (postoperative nausea and vomiting)   . High cholesterol   . Diabetic neuropathy   . Angina   . Myocardial infarction 12/02/11    "they think I might have had a light heart attack this wk"  . Pneumonia   . Bronchitis   . Hypothyroidism   . History of stomach ulcers 1970's  . Arthritis     "in my hands"  . Depression   . S/P CABG (coronary artery bypass graft), 12/04/11 12/07/2011  . Acute renal failure, 01/03/12 01/03/2012  . Diverticulosis   . Hemorrhoids   . Esophageal stricture     Past Surgical History  Procedure Laterality Date  . Fracture surgery      "put pins  both side right ankle"  . Total knee arthroplasty  ~ 2006    left  . Joint replacement    . Back surgery  2006    "cyst growing near my spine"  . Tonsillectomy  1949  . Abdominal hysterectomy  1980's  . Dilation and curettage of uterus      "a couple times"  . Cesarean section  1977  . Cataract extraction w/ intraocular lens  implant, bilateral  ~ 2010  . Cardiac catheterization  12/02/11  . Coronary artery  bypass graft  12/04/2011    Procedure: CORONARY ARTERY BYPASS GRAFTING (CABG);  Surgeon: Tharon Aquas Adelene Idler, MD;  Location: [place];  Service: Open Heart Surgery;  Laterality: N/A;  CABG x three,  using left internal mammary artery, and right leg greater saphenous vein harvested endoscopically    FAMHx:  Family History  Problem Relation Age of Onset  . Colon cancer Neg Hx   . Breast cancer Sister     x 3  . Heart disease Father   . Heart disease Brother     x5  . Heart disease Sister     x3  . Diabetes Mother   . Diabetes Sister     x3  . Lung cancer Sister     SOCHx:   reports that she has never smoked. She has never used  smokeless tobacco. She reports that she does not drink alcohol or use illicit drugs.  ALLERGIES:  Allergies  Allergen Reactions  . Epinephrine Other (See Comments)    Abnormal feeling. Dental exam/injection of local w/ epi.    ROS: A comprehensive review of systems was negative except for: Constitutional: positive for fatigue Cardiovascular: positive for lower extremity edema Integument/breast: positive for skin color change Musculoskeletal: positive for myalgias  HOME MEDS: Current Outpatient Prescriptions  Medication Sig Dispense Refill  . acetaminophen (TYLENOL) 500 MG tablet Take 500 mg by mouth every 6 (six) hours as needed. pain      . amLODipine (NORVASC) 5 MG tablet Take 5 mg by mouth daily.      . Ascorbic Acid (VITAMIN C PO) Take 1 tablet by mouth daily.      Marland Kitchen aspirin EC 81 MG tablet Take 81 mg by mouth daily.      . Cyanocobalamin (VITAMIN B 12 PO) Take 1 tablet by mouth as needed.       . furosemide (LASIX) 20 MG tablet TAKE ONE TABLET BY MOUTH ONCE DAILY  30 tablet  5  . insulin glargine (LANTUS) 100 UNIT/ML injection Inject 10 Units into the skin at bedtime.      Marland Kitchen levothyroxine (SYNTHROID, LEVOTHROID) 50 MCG tablet Take 50 mcg by mouth every other day.      . losartan (COZAAR) 50 MG tablet Take 50 mg by mouth daily.      . meclizine (ANTIVERT) 25 MG tablet Take 25 mg by mouth 4 (four) times daily as needed. For dizziness      . Multiple Vitamin (MULITIVITAMIN WITH MINERALS) TABS Take 1 tablet by mouth daily.      Marland Kitchen omeprazole (PRILOSEC) 40 MG capsule Take 1 capsule (40 mg total) by mouth daily.  30 capsule  11  . OVER THE COUNTER MEDICATION PreserVision OTC,  One a day      . Polyethyl Glycol-Propyl Glycol (SYSTANE OP) Apply 1 drop to eye 2 (two) times daily.      . rosuvastatin (CRESTOR) 10 MG tablet Take 10 mg by mouth every evening. Take at 6pm      . sitaGLIPtan-metformin (JANUMET) 50-500 MG per tablet Take 1 tablet by mouth 2 (two) times daily with a meal.        . VITAMIN D, CHOLECALCIFEROL, PO Take 2 tablets by mouth daily.      . cephALEXin (KEFLEX) 500 MG capsule Take 1 capsule twice daily for 7 days  14 capsule  0  . gabapentin (NEURONTIN) 100 MG capsule Take 300 mg by mouth 2 (two) times daily.       No  current facility-administered medications for this visit.    LABS/IMAGING: No results found for this or any previous visit (from the past 48 hour(s)). No results found.  VITALS: BP 150/52  Pulse 60  Ht 5\' 1"  (1.549 m)  Wt 144 lb (65.318 kg)  BMI 27.22 kg/m2  EXAM: General appearance: alert and no distress Neck: no adenopathy, no carotid bruit, no JVD, supple, symmetrical, trachea midline and thyroid not enlarged, symmetric, no tenderness/mass/nodules Lungs: clear to auscultation bilaterally Heart: regular rate and rhythm, S1, S2 normal, no murmur, click, rub or gallop Abdomen: soft, non-tender; bowel sounds normal; no masses,  no organomegaly Extremities: 1+ right lower extremity and 2+ left lower extremity edema, bilateral erythema worse on the left than the right, warmth and a large 2 cm hematoma on the left leg Pulses: 2+ and symmetric Skin: Bilateral erythema Neurologic: Grossly normal  EKG: deferred  ASSESSMENT: 1. Bilateral lower extremity edema, hematoma, possible cellulitis 2. Coronary artery disease status post CABG x 3 vessels in 2013 3. Diabetes type 2 4. Hypertension 5. Dyslipidemia 6. Peripheral neuropathy  PLAN: 1.   Ms. Seda has bilateral lower extremity swelling which is persistent despite Lasix. It seems to be worse the left leg than the right. She recently had trauma with dropping a jar on her left shin and has a what appears to be an unresolved hematoma. There may be overlying cellulitis, but it does appear to be somewhat pruritic which may just be a rash from venous stasis. I do recommend lower M.D. Dopplers to rule out DVT. I've also given her prescription for Keflex to see if it improves her erythema.  She may ultimately need bilateral compression stockings. Some of her symptoms could be due to neuropathy which he clearly has. She continues to have pain in her legs and restlessness, and may need to be changed from gabapentin to a stronger neuropathic pain medicine such as Lyrica.  She is also requesting referral to a endocrinologist, which I will defer to her primary care provider.  [name], MD, [facility] Attending Cardiologist [facility] 04/18/2013, 1:38 PM

## 2013-04-18 NOTE — Progress Notes (Signed)
Lower Extremity Venous Duplex Completed. [name]

## 2013-04-18 NOTE — Patient Instructions (Addendum)
Dr Debara Pickett has sent in a prescription for Keflex 500mg  twice a day for 7 days for the area on your leg.  We will do lower extremity venous dopplers of both legs today.

## 2013-04-23 ENCOUNTER — Telehealth: Payer: Self-pay | Admitting: Internal Medicine

## 2013-04-23 NOTE — Telephone Encounter (Signed)
Wants to know if her ultra sound results are back from last Thursday(04-19-13)?

## 2013-04-23 NOTE — Telephone Encounter (Signed)
Returned call and spoke w/ pt.  Informed results have not been reviewed by MD/PA and nurse will call after they are reviewed.  Pt verbalized understanding and agreed w/ plan.

## 2013-04-26 ENCOUNTER — Telehealth: Payer: Self-pay | Admitting: Internal Medicine

## 2013-04-26 NOTE — Telephone Encounter (Signed)
Wants to know if ultra sounds results are ready?

## 2013-04-30 NOTE — Telephone Encounter (Signed)
Message forwarded to Dr. Debara Pickett.

## 2013-05-01 NOTE — Telephone Encounter (Signed)
I discussed the doppler results with Ms. Mizera on the phone this morning.  -Dr. Debara Pickett

## 2013-09-04 ENCOUNTER — Encounter: Payer: Self-pay | Admitting: *Deleted

## 2013-09-05 ENCOUNTER — Encounter: Payer: Self-pay | Admitting: Internal Medicine

## 2013-09-05 ENCOUNTER — Ambulatory Visit (INDEPENDENT_AMBULATORY_CARE_PROVIDER_SITE_OTHER): Payer: Medicare Other | Admitting: Internal Medicine

## 2013-09-05 VITALS — BP 120/62 | HR 59 | Ht 61.0 in | Wt 140.0 lb

## 2013-09-05 DIAGNOSIS — I480 Paroxysmal atrial fibrillation: Secondary | ICD-10-CM

## 2013-09-05 DIAGNOSIS — Z951 Presence of aortocoronary bypass graft: Secondary | ICD-10-CM

## 2013-09-05 DIAGNOSIS — E1049 Type 1 diabetes mellitus with other diabetic neurological complication: Secondary | ICD-10-CM

## 2013-09-05 DIAGNOSIS — E1042 Type 1 diabetes mellitus with diabetic polyneuropathy: Secondary | ICD-10-CM | POA: Insufficient documentation

## 2013-09-05 DIAGNOSIS — E785 Hyperlipidemia, unspecified: Secondary | ICD-10-CM

## 2013-09-05 DIAGNOSIS — I214 Non-ST elevation (NSTEMI) myocardial infarction: Secondary | ICD-10-CM

## 2013-09-05 DIAGNOSIS — E119 Type 2 diabetes mellitus without complications: Secondary | ICD-10-CM

## 2013-09-05 DIAGNOSIS — Z8249 Family history of ischemic heart disease and other diseases of the circulatory system: Secondary | ICD-10-CM

## 2013-09-05 DIAGNOSIS — I10 Essential (primary) hypertension: Secondary | ICD-10-CM

## 2013-09-05 DIAGNOSIS — G909 Disorder of the autonomic nervous system, unspecified: Secondary | ICD-10-CM

## 2013-09-05 DIAGNOSIS — I4891 Unspecified atrial fibrillation: Secondary | ICD-10-CM

## 2013-09-05 NOTE — Patient Instructions (Signed)
Take lasix if weight increases by 2 lbs in 2 days. Take 20mg  daily until weight returns to baseline.  Take if you notice increased swelling.  Your physician wants you to follow-up in: 6 months. You will receive a reminder letter in the mail two months in advance. If you don't receive a letter, please call our office to schedule the follow-up appointment.

## 2013-09-05 NOTE — Progress Notes (Signed)
OFFICE NOTE  Chief Complaint:  Followup edema  Primary Care Physician: Haywood Pao, MD  HPI:  [name] is a pleasant 72 year old female with history of coronary disease and CABG x 3 vessels in 2013, LIMA to LAD, SVG to OM and SVG to right coronary. I saw her back in the office about 1 month ago for some increased shortness of breath, generalized fatigue and lower extremity swelling. She has had increasing weight gain and edema. She reports some fatigue especially when gardening and heaviness in her legs. She underwent an echocardiogram which demonstrates normal EF of 55% to 65%. However, there is stage 1 diastolic dysfunction and evidence for elevated mean left atrial filling pressure. There were mildly thickened calcified aortic valve leaflets but no stenosis and mild tricuspid regurgitation. At the time, I started her on Lasix 20 mg daily. A BMP was obtained that day which indicated mild elevation at 75.5, creatinine was 1.16. She has been taking the Lasix with improvement in her lower extremity swelling although shortness of breath seems to be about the same. Again, she notes it is only with marked exertion, not necessarily with normal activities. However, she does have ongoing fatigue and easy fatigability. This may be due to just poor exercise tolerance. She also reported that recently you started her on a new SLGT2 diabetic medicine and she said that she took several doses of that, which I believe was Invokana, and she became markedly dizzy with that. She has since stopped that medication. Again, overall the patient is fairly stable with a normal ejection fraction. She recently had bypass and is not having any anginal symptoms and therefore I do not suspect that this is an issue related to her bypass grafts.   Her lower extremity swelling he has noted to resolve. She thinks that she is doing quite well at this time. She stopped taking her Lasix because she felt that she did not need  it. She does have diastolic dysfunction but her weight has been fairly stable. She denies any chest pain or palpitations.  She also reports that she since been started on insulin by her primary care provider and that her glucose levels are better than they have been in some time.  PMHx:  Past Medical History  Diagnosis Date  . Diabetes mellitus   . Hypertension   . GERD (gastroesophageal reflux disease)   . Neuropathy   . PONV (postoperative nausea and vomiting)   . High cholesterol   . Diabetic neuropathy   . Angina   . Myocardial infarction 12/02/11    "they think I might have had a light heart attack this wk"  . Pneumonia   . Bronchitis   . Hypothyroidism   . History of stomach ulcers 1970's  . Arthritis     "in my hands"  . Depression   . S/P CABG (coronary artery bypass graft), 12/04/11 12/07/2011    LIMA to LAD, SVG to OM, SVG to RCA  . Acute renal failure, 01/03/12 01/03/2012  . Diverticulosis   . Hemorrhoids   . Esophageal stricture   . History of nuclear stress test 03/10/2007    dipyridamole; low risk scan     Past Surgical History  Procedure Laterality Date  . Fracture surgery      "put pins both side right ankle"  . Total knee arthroplasty  ~ 2006    left  . Joint replacement    . Back surgery  2006    "cyst growing  near my spine"  . Tonsillectomy  1949  . Abdominal hysterectomy  1980's  . Dilation and curettage of uterus      "a couple times"  . Cesarean section  1977  . Cataract extraction w/ intraocular lens  implant, bilateral  ~ 2010  . Cardiac catheterization  12/02/2011    mild LV dysfunction with mod hypocontractility of mid-distal anterolateral wall; CAD w/ostial tapering of L Main with 50% diffuse ostial narrowing of LAD, 99% eccentric focal prox LAD stenosis followed by 70% prox LAD stenosis after 1st diag, 20% mid LAD narrowing; 80% ostial-to-prox L Cfx stenosis & 40-50% irregularity of RCA (Dr. Corky Downs)  . Coronary artery bypass graft  12/04/2011     Procedure: CORONARY ARTERY BYPASS GRAFTING (CABG);  Surgeon: Tharon Aquas Adelene Idler, MD;  Location: [place];  Service: Open Heart Surgery;  Laterality: N/A;  CABG x three,  using left internal mammary artery, and right leg greater saphenous vein harvested endoscopically  . Transthoracic echocardiogram  02/19/2013    EF 0000000, grade 1 diastolic dysfunction; mildly thickend/calcified AV leaflets; mildly calcidied MV annulus; mild TR    FAMHx:  Family History  Problem Relation Age of Onset  . Colon cancer Neg Hx   . Breast cancer Sister     x 3  . Heart disease Father     also hyperlipidemia  . Heart disease Brother     x5; one with MI  . Heart disease Sister     x3  . Diabetes Mother     also, HTN & CVA  . Diabetes Sister     x3  . Lung cancer Sister   . Breast cancer Sister     x2    SOCHx:   reports that she has never smoked. She has never used smokeless tobacco. She reports that she does not drink alcohol or use illicit drugs.  ALLERGIES:  Allergies  Allergen Reactions  . Epinephrine Other (See Comments)    Abnormal feeling. Dental exam/injection of local w/ epi.    ROS: A comprehensive review of systems was negative except for: Constitutional: positive for fatigue Musculoskeletal: positive for myalgias  HOME MEDS: Current Outpatient Prescriptions  Medication Sig Dispense Refill  . acetaminophen (TYLENOL) 500 MG tablet Take 500 mg by mouth every 6 (six) hours as needed. pain      . amLODipine (NORVASC) 5 MG tablet Take 10 mg by mouth daily.       . Ascorbic Acid (VITAMIN C PO) Take 1 tablet by mouth daily.      Marland Kitchen aspirin EC 81 MG tablet Take 81 mg by mouth daily.      . Cyanocobalamin (VITAMIN B 12 PO) Take 1 tablet by mouth as needed.       . furosemide (LASIX) 20 MG tablet TAKE ONE TABLET BY MOUTH ONCE DAILY  30 tablet  5  . gabapentin (NEURONTIN) 300 MG capsule Take 300 mg by mouth 3 (three) times daily.      . insulin glargine (LANTUS) 100 UNIT/ML injection Inject  10-12 Units into the skin at bedtime. SSI 10-12 units      . levothyroxine (SYNTHROID, LEVOTHROID) 50 MCG tablet Take 50 mcg by mouth every other day.      . losartan (COZAAR) 50 MG tablet Take 50 mg by mouth daily.      . meclizine (ANTIVERT) 25 MG tablet Take 25 mg by mouth 4 (four) times daily as needed. For dizziness      .  Multiple Vitamin (MULITIVITAMIN WITH MINERALS) TABS Take 1 tablet by mouth daily.      Marland Kitchen omeprazole (PRILOSEC) 40 MG capsule Take 1 capsule (40 mg total) by mouth daily.  30 capsule  11  . OVER THE COUNTER MEDICATION Take 1 tablet by mouth 2 (two) times daily. PreserVision OTC      . Polyethyl Glycol-Propyl Glycol (SYSTANE OP) Apply 1 drop to eye 2 (two) times daily.      . rosuvastatin (CRESTOR) 10 MG tablet Take 10 mg by mouth every evening. Take at 6pm      . sitaGLIPtan-metformin (JANUMET) 50-500 MG per tablet Take 1 tablet by mouth 2 (two) times daily with a meal.      . VITAMIN D, CHOLECALCIFEROL, PO Take 1-2 tablets by mouth daily.        No current facility-administered medications for this visit.    LABS/IMAGING: No results found for this or any previous visit (from the past 48 hour(s)). No results found.  VITALS: BP 120/62  Pulse 59  Ht 5\' 1"  (1.549 m)  Wt 140 lb (63.504 kg)  BMI 26.47 kg/m2  EXAM: General appearance: alert and no distress Neck: no adenopathy, no carotid bruit, no JVD, supple, symmetrical, trachea midline and thyroid not enlarged, symmetric, no tenderness/mass/nodules Lungs: clear to auscultation bilaterally Heart: regular rate and rhythm, S1, S2 normal, no murmur, click, rub or gallop Abdomen: soft, non-tender; bowel sounds normal; no masses,  no organomegaly Extremities: 1+ right lower extremity and 2+ left lower extremity edema, bilateral erythema worse on the left than the right, warmth and a large 2 cm hematoma on the left leg Pulses: 2+ and symmetric Skin: Bilateral erythema Neurologic: Grossly normal  EKG: Normal sinus  rhythm at 59  ASSESSMENT: 1. Coronary artery disease status post CABG x 3 vessels in 2013 2. Diabetes type 2 - now on insulin 3. Hypertension 4. Dyslipidemia 5. Peripheral neuropathy - secondary to diabetes  PLAN: 1.   Ms. Demetrius has had improvement in her lower extremity swelling. Her blood sugars better controlled on insulin. Her blood pressure is at goal today and she has stopped taking her Lasix due to the lack of swelling. I've gone and provided her with a sliding scale of Lasix to take if she has weight gain or associated swelling. Otherwise no changes in her medicines today and we'll see her back in 6 months.  [name], MD, [facility] Attending Cardiologist [facility] 09/05/2013, 11:59 AM

## 2013-09-11 ENCOUNTER — Other Ambulatory Visit: Payer: Self-pay | Admitting: Internal Medicine

## 2013-11-05 ENCOUNTER — Other Ambulatory Visit: Payer: Self-pay | Admitting: Internal Medicine

## 2013-12-28 ENCOUNTER — Other Ambulatory Visit: Payer: Self-pay | Admitting: *Deleted

## 2013-12-28 MED ORDER — AMLODIPINE BESYLATE 10 MG PO TABS: 10.0000 mg | ORAL_TABLET | Freq: Every day | ORAL | Status: DC

## 2013-12-28 NOTE — Telephone Encounter (Signed)
Rx was sent to pharmacy electronically. 

## 2014-03-04 ENCOUNTER — Ambulatory Visit (INDEPENDENT_AMBULATORY_CARE_PROVIDER_SITE_OTHER): Payer: Medicare Other | Admitting: Internal Medicine

## 2014-03-04 ENCOUNTER — Encounter: Payer: Self-pay | Admitting: Internal Medicine

## 2014-03-04 VITALS — BP 136/60 | HR 64 | Ht 61.0 in | Wt 142.4 lb

## 2014-03-04 DIAGNOSIS — I4891 Unspecified atrial fibrillation: Secondary | ICD-10-CM

## 2014-03-04 DIAGNOSIS — E785 Hyperlipidemia, unspecified: Secondary | ICD-10-CM

## 2014-03-04 DIAGNOSIS — I10 Essential (primary) hypertension: Secondary | ICD-10-CM

## 2014-03-04 DIAGNOSIS — I214 Non-ST elevation (NSTEMI) myocardial infarction: Secondary | ICD-10-CM

## 2014-03-04 DIAGNOSIS — I480 Paroxysmal atrial fibrillation: Secondary | ICD-10-CM

## 2014-03-04 MED ORDER — ROSUVASTATIN CALCIUM 10 MG PO TABS: 10.0000 mg | ORAL_TABLET | Freq: Every evening | ORAL | Status: DC

## 2014-03-04 NOTE — Patient Instructions (Addendum)
Dr Debara Pickett wants you to follow-up in 6 months. You will receive a reminder letter in the mail one months in advance. If you don't receive a letter, please call our office to schedule the follow-up appointment.      Samples: Crestor 10 mg 5 packs Lot# [id number] EXP 10/2014

## 2014-03-04 NOTE — Progress Notes (Signed)
OFFICE NOTE  Chief Complaint:  Followup edema  Primary Care Physician: Haywood Pao, MD  HPI:  [name] is a pleasant 73 year old female with history of coronary disease and CABG x 3 vessels in 2013, LIMA to LAD, SVG to OM and SVG to right coronary. She underwent an echocardiogram which demonstrates normal EF of 55% to 65%. However, there is stage 1 diastolic dysfunction and evidence for elevated mean left atrial filling pressure. There were mildly thickened calcified aortic valve leaflets but no stenosis and mild tricuspid regurgitation. At the time, I started her on Lasix 20 mg daily. A BMP was obtained that day which indicated mild elevation at 75.5, creatinine was 1.16. She has been taking the Lasix with improvement in her lower extremity swelling although shortness of breath seems to be about the same. Again, she notes it is only with marked exertion, not necessarily with normal activities. However, she does have ongoing fatigue and easy fatigability. This may be due to just poor exercise tolerance. She also reported that recently you started her on a new SLGT2 diabetic medicine and she said that she took several doses of that, which I believe was Invokana, and she became markedly dizzy with that. She has since stopped that medication. Again, overall the patient is fairly stable with a normal ejection fraction. She recently had bypass and is not having any anginal symptoms and therefore I do not suspect that this is an issue related to her bypass grafts.   Her lower extremity swelling he has noted to resolve. She thinks that she is doing quite well at this time. She stopped taking her Lasix because she felt that she did not need it, but I requested that she restart 20 mg daily. She does have diastolic dysfunction but her weight has been fairly stable. She denies any chest pain or palpitations.  She also reports that she since been started on insulin by her primary care provider and  that her glucose levels are better than they have been in some time.    PMHx:  Past Medical History  Diagnosis Date  . Diabetes mellitus   . Hypertension   . GERD (gastroesophageal reflux disease)   . Neuropathy   . PONV (postoperative nausea and vomiting)   . High cholesterol   . Diabetic neuropathy   . Angina   . Myocardial infarction 12/02/11    "they think I might have had a light heart attack this wk"  . Pneumonia   . Bronchitis   . Hypothyroidism   . History of stomach ulcers 1970's  . Arthritis     "in my hands"  . Depression   . S/P CABG (coronary artery bypass graft), 12/04/11 12/07/2011    LIMA to LAD, SVG to OM, SVG to RCA  . Acute renal failure, 01/03/12 01/03/2012  . Diverticulosis   . Hemorrhoids   . Esophageal stricture   . History of nuclear stress test 03/10/2007    dipyridamole; low risk scan     Past Surgical History  Procedure Laterality Date  . Fracture surgery      "put pins both side right ankle"  . Total knee arthroplasty  ~ 2006    left  . Joint replacement    . Back surgery  2006    "cyst growing near my spine"  . Tonsillectomy  1949  . Abdominal hysterectomy  1980's  . Dilation and curettage of uterus      "a couple times"  .  Cesarean section  1977  . Cataract extraction w/ intraocular lens  implant, bilateral  ~ 2010  . Cardiac catheterization  12/02/2011    mild LV dysfunction with mod hypocontractility of mid-distal anterolateral wall; CAD w/ostial tapering of L Main with 50% diffuse ostial narrowing of LAD, 99% eccentric focal prox LAD stenosis followed by 70% prox LAD stenosis after 1st diag, 20% mid LAD narrowing; 80% ostial-to-prox L Cfx stenosis & 40-50% irregularity of RCA (Dr. Corky Downs)  . Coronary artery bypass graft  12/04/2011    Procedure: CORONARY ARTERY BYPASS GRAFTING (CABG);  Surgeon: Tharon Aquas Adelene Idler, MD;  Location: [place];  Service: Open Heart Surgery;  Laterality: N/A;  CABG x three,  using left internal mammary artery, and right  leg greater saphenous vein harvested endoscopically  . Transthoracic echocardiogram  02/19/2013    EF 0000000, grade 1 diastolic dysfunction; mildly thickend/calcified AV leaflets; mildly calcidied MV annulus; mild TR    FAMHx:  Family History  Problem Relation Age of Onset  . Colon cancer Neg Hx   . Breast cancer Sister     x 3  . Heart disease Father     also hyperlipidemia  . Heart disease Brother     x5; one with MI  . Heart disease Sister     x3  . Diabetes Mother     also, HTN & CVA  . Diabetes Sister     x3  . Lung cancer Sister   . Breast cancer Sister     x2    SOCHx:   reports that she has never smoked. She has never used smokeless tobacco. She reports that she does not drink alcohol or use illicit drugs.  ALLERGIES:  Allergies  Allergen Reactions  . Epinephrine Other (See Comments)    Abnormal feeling. Dental exam/injection of local w/ epi.    ROS: A comprehensive review of systems was negative except for: Constitutional: positive for fatigue Musculoskeletal: positive for myalgias  HOME MEDS: Current Outpatient Prescriptions  Medication Sig Dispense Refill  . acetaminophen (TYLENOL) 500 MG tablet Take 500 mg by mouth every 6 (six) hours as needed. pain      . amLODipine (NORVASC) 10 MG tablet Take 1 tablet (10 mg total) by mouth daily.  30 tablet  7  . Ascorbic Acid (VITAMIN C PO) Take 1 tablet by mouth daily.      Marland Kitchen aspirin EC 81 MG tablet Take 81 mg by mouth daily.      . Cyanocobalamin (VITAMIN B 12 PO) Take 1 tablet by mouth as needed.       . furosemide (LASIX) 20 MG tablet TAKE ONE TABLET BY MOUTH ONCE DAILY  30 tablet  5  . gabapentin (NEURONTIN) 300 MG capsule Take 300 mg by mouth 3 (three) times daily.      . insulin glargine (LANTUS) 100 UNIT/ML injection Inject 10-14 Units into the skin at bedtime. SSI 10-14 units      . levothyroxine (SYNTHROID, LEVOTHROID) 50 MCG tablet Take 50 mcg by mouth every other day.      . losartan (COZAAR) 50 MG  tablet Take 50 mg by mouth daily.      Marland Kitchen MAGNESIUM PO Take by mouth daily.      . meclizine (ANTIVERT) 25 MG tablet Take 25 mg by mouth 4 (four) times daily as needed. For dizziness      . Multiple Vitamin (MULITIVITAMIN WITH MINERALS) TABS Take 1 tablet by mouth daily.      Marland Kitchen  omeprazole (PRILOSEC) 40 MG capsule TAKE ONE CAPSULE BY MOUTH ONCE DAILY  30 capsule  3  . OVER THE COUNTER MEDICATION Take 1 tablet by mouth 2 (two) times daily. PreserVision OTC      . Polyethyl Glycol-Propyl Glycol (SYSTANE OP) Apply 1 drop to eye 2 (two) times daily.      . rosuvastatin (CRESTOR) 10 MG tablet Take 1 tablet (10 mg total) by mouth every evening. Take at 6pm  35 tablet  0  . sitaGLIPtan-metformin (JANUMET) 50-500 MG per tablet Take 1 tablet by mouth 2 (two) times daily with a meal.      . VITAMIN D, CHOLECALCIFEROL, PO Take 1 tablet by mouth daily.        No current facility-administered medications for this visit.    LABS/IMAGING: No results found for this or any previous visit (from the past 48 hour(s)). No results found.  VITALS: BP 136/60  Pulse 64  Ht 5\' 1"  (1.549 m)  Wt 142 lb 6.4 oz (64.592 kg)  BMI 26.92 kg/m2  EXAM: General appearance: alert and no distress Neck: no adenopathy, no carotid bruit, no JVD, supple, symmetrical, trachea midline and thyroid not enlarged, symmetric, no tenderness/mass/nodules Lungs: clear to auscultation bilaterally Heart: regular rate and rhythm, S1, S2 normal, no murmur, click, rub or gallop Abdomen: soft, non-tender; bowel sounds normal; no masses,  no organomegaly Extremities: 1+ right lower extremity and 2+ left lower extremity edema, bilateral erythema worse on the left than the right, warmth and a large 2 cm hematoma on the left leg Pulses: 2+ and symmetric Skin: Bilateral erythema Neurologic: Grossly normal  EKG: Normal sinus rhythm at 64  ASSESSMENT: 1. Coronary artery disease status post CABG x 3 vessels in 2013 2. Diabetes type 2 - now on  insulin 3. Hypertension 4. Dyslipidemia 5. Peripheral neuropathy - secondary to diabetes  PLAN: 1.   Ms. Rus has had improvement in her lower extremity swelling. Her blood sugars better controlled on insulin. She has gained some weight due to inactivity. I've encouraged her to continue to work on some walking and exercise which will help with her energy level. Her blood pressure is controlled today. No changes on her medications we'll plan to see her back in 6 months.  [name], MD, [facility] Attending Cardiologist [facility] 03/04/2014, 4:42 PM

## 2014-05-14 ENCOUNTER — Other Ambulatory Visit: Payer: Self-pay | Admitting: Internal Medicine

## 2014-06-17 NOTE — Progress Notes (Signed)
Ardeen Jourdain, [place]  -  Please enter preop orders in epic for [name].  Thanks.

## 2014-06-18 ENCOUNTER — Other Ambulatory Visit: Payer: Self-pay | Admitting: Surgical

## 2014-06-21 ENCOUNTER — Encounter (HOSPITAL_COMMUNITY): Payer: Self-pay | Admitting: Pharmacy Technician

## 2014-06-25 ENCOUNTER — Encounter (HOSPITAL_COMMUNITY)
Admission: RE | Admit: 2014-06-25 | Discharge: 2014-06-25 | Disposition: A | Discharge status: Home / Self Care | Payer: Medicare Other | Source: Ambulatory Visit | Attending: Orthopedic Surgery | Admitting: Orthopedic Surgery

## 2014-06-25 ENCOUNTER — Ambulatory Visit (HOSPITAL_COMMUNITY)
Admission: RE | Admit: 2014-06-25 | Discharge: 2014-06-25 | Disposition: A | Discharge status: Home / Self Care | Payer: Medicare Other | Source: Ambulatory Visit | Attending: Anesthesiology | Admitting: Anesthesiology

## 2014-06-25 ENCOUNTER — Encounter (HOSPITAL_COMMUNITY): Payer: Self-pay

## 2014-06-25 DIAGNOSIS — J40 Bronchitis, not specified as acute or chronic: Secondary | ICD-10-CM | POA: Insufficient documentation

## 2014-06-25 DIAGNOSIS — Z01818 Encounter for other preprocedural examination: Secondary | ICD-10-CM | POA: Insufficient documentation

## 2014-06-25 DIAGNOSIS — I252 Old myocardial infarction: Secondary | ICD-10-CM | POA: Diagnosis not present

## 2014-06-25 HISTORY — DX: Atherosclerotic heart disease of native coronary artery without angina pectoris: I25.10

## 2014-06-25 HISTORY — DX: Pain, unspecified: R52

## 2014-06-25 LAB — CBC
HCT: 34.3 % — ABNORMAL LOW (ref 36.0–46.0)
Hemoglobin: 11.5 g/dL — ABNORMAL LOW (ref 12.0–15.0)
MCH: 31.9 pg (ref 26.0–34.0)
MCHC: 33.5 g/dL (ref 30.0–36.0)
MCV: 95.3 fL (ref 78.0–100.0)
Platelets: 173 10*3/uL (ref 150–400)
RBC: 3.6 MIL/uL — ABNORMAL LOW (ref 3.87–5.11)
RDW: 12.9 % (ref 11.5–15.5)
WBC: 7.9 10*3/uL (ref 4.0–10.5)

## 2014-06-25 LAB — BASIC METABOLIC PANEL
Anion gap: 12 (ref 5–15)
BUN: 25 mg/dL — ABNORMAL HIGH (ref 6–23)
CO2: 23 mEq/L (ref 19–32)
Calcium: 9.9 mg/dL (ref 8.4–10.5)
Chloride: 110 mEq/L (ref 96–112)
Creatinine, Ser: 1.12 mg/dL — ABNORMAL HIGH (ref 0.50–1.10)
GFR calc Af Amer: 55 mL/min — ABNORMAL LOW (ref >90)
GFR calc non Af Amer: 47 mL/min — ABNORMAL LOW (ref >90)
Glucose, Bld: 69 mg/dL — ABNORMAL LOW (ref 70–99)
Potassium: 6.1 mEq/L — ABNORMAL HIGH (ref 3.7–5.3)
Sodium: 145 mEq/L (ref 137–147)

## 2014-06-25 NOTE — Patient Instructions (Addendum)
YOUR SURGERY IS SCHEDULED AT [facility]  ON:  Tuesday  9/8  REPORT TO  SHORT STAY CENTER AT:  10:30 AM   PLEASE COME IN THE [facility] MAIN HOSPITAL ENTRANCE AND FOLLOW SIGNS TO SHORT STAY CENTER.  DO NOT EAT OR DRINK ANYTHING AFTER MIDNIGHT THE NIGHT BEFORE YOUR SURGERY.  YOU MAY BRUSH YOUR TEETH, RINSE OUT YOUR MOUTH--BUT NO WATER, NO FOOD, NO CHEWING GUM, NO MINTS, NO CANDIES, NO CHEWING TOBACCO.  PLEASE TAKE THE FOLLOWING MEDICATIONS THE AM OF YOUR SURGERY WITH A FEW SIPS OF WATER:   AMLODIPINE, GABAPENTIN ( NEURONTIN ), LEVOTHYROXINE ( SYNTHROID ), OMEPRAZOLE ( PRILOSEC ), AND USE YOUR SYSTANE EYE DROPS.   IF YOU ARE DIABETIC:  DO NOT TAKE ANY DIABETIC MEDICATIONS THE AM OF YOUR SURGERY.  IF YOU TAKE INSULIN IN THE EVENINGS--PLEASE ONLY TAKE 1/2 NORMAL EVENING DOSE THE NIGHT BEFORE YOUR SURGERY.  NO INSULIN THE AM OF YOUR SURGERY.     DO NOT BRING VALUABLES, MONEY, CREDIT CARDS.  DO NOT WEAR JEWELRY, MAKE-UP, NAIL POLISH AND NO METAL PINS OR CLIPS IN YOUR HAIR. CONTACT LENS, DENTURES / PARTIALS, GLASSES SHOULD NOT BE WORN TO SURGERY AND IN MOST CASES-HEARING AIDS WILL NEED TO BE REMOVED.  BRING YOUR GLASSES CASE, ANY EQUIPMENT NEEDED FOR YOUR CONTACT LENS. FOR PATIENTS ADMITTED TO THE HOSPITAL--CHECK OUT TIME THE DAY OF DISCHARGE IS 11:00 AM.  ALL INPATIENT ROOMS ARE PRIVATE - WITH BATHROOM, TELEPHONE, TELEVISION AND WIFI INTERNET.  IF YOU ARE BEING DISCHARGED THE SAME DAY OF YOUR SURGERY--YOU CAN NOT DRIVE YOURSELF HOME--AND SHOULD NOT GO HOME ALONE BY TAXI OR BUS.  NO DRIVING OR OPERATING MACHINERY, OR MAKING LEGAL DECISIONS FOR 24 HOURS FOLLOWING ANESTHESIA / PAIN MEDICATIONS.  PLEASE MAKE ARRANGEMENTS FOR SOMEONE TO BE WITH YOU AT HOME THE FIRST 24 HOURS AFTER SURGERY. RESPONSIBLE DRIVER'S NAME / PHONE                                                   PLEASE READ OVER ANY  FACT SHEETS THAT YOU WERE GIVEN: MRSA INFORMATION, BLOOD TRANSFUSION INFORMATION, INCENTIVE  SPIROMETER INFORMATION.  PLEASE BE AWARE THAT YOU MAY NEED ADDITIONAL BLOOD DRAWN DAY OF YOUR SURGERY  _______________________________________________________________________   [facility] - Preparing for Surgery Before surgery, you can play an important role.  Because skin is not sterile, your skin needs to be as free of germs as possible.  You can reduce the number of germs on your skin by washing with CHG (chlorahexidine gluconate) soap before surgery.  CHG is an antiseptic cleaner which kills germs and bonds with the skin to continue killing germs even after washing. Please DO NOT use if you have an allergy to CHG or antibacterial soaps.  If your skin becomes reddened/irritated stop using the CHG and inform your nurse when you arrive at Short Stay. Do not shave (including legs and underarms) for at least 48 hours prior to the first CHG shower.  You may shave your face/neck. Please follow these instructions carefully:  1.  Shower with CHG Soap the night before surgery and the  morning of Surgery.  2.  If you choose to wash your hair, wash your hair first as usual with your  normal  shampoo.  3.  After you shampoo, rinse your hair and body thoroughly to remove the  shampoo.                           4.  Use CHG as you would any other liquid soap.  You can apply chg directly  to the skin and wash                       Gently with a scrungie or clean washcloth.  5.  Apply the CHG Soap to your body ONLY FROM THE NECK DOWN.   Do not use on face/ open                           Wound or open sores. Avoid contact with eyes, ears mouth and genitals (private parts).                       Wash face,  Genitals (private parts) with your normal soap.             6.  Wash thoroughly, paying special attention to the area where your surgery  will be performed.  7.  Thoroughly rinse your body with warm water from the neck down.  8.  DO NOT shower/wash with your normal soap after using and rinsing off  the CHG  Soap.                9.  Pat yourself dry with a clean towel.            10.  Wear clean pajamas.            11.  Place clean sheets on your bed the night of your first shower and do not  sleep with pets. Day of Surgery : Do not apply any lotions/deodorants the morning of surgery.  Please wear clean clothes to the hospital/surgery center.  FAILURE TO FOLLOW THESE INSTRUCTIONS MAY RESULT IN THE CANCELLATION OF YOUR SURGERY PATIENT SIGNATURE_________________________________  NURSE SIGNATURE__________________________________  ________________________________________________________________________

## 2014-06-25 NOTE — Pre-Procedure Instructions (Signed)
EKG REPORT AND CARDIOLOGY OFFICE NOTE IN EPIC FROM 03-04-14 OFFICE VISIT DR. HILTY. CXR WAS DONE TODAY PREOP. PT'S PREOP BMET REPORT FAXED TO DR. Charlestine Night OFFICE FOR REVIEW OF ABNORMALS AND NOTE TO DR. Gladstone Lighter THAT PT DID NOT SIGN OR CONSENT - WANTS HIM TO CALL HER TO DISCUSS RISKS, COMPLICATIONS, ETC.

## 2014-06-26 NOTE — H&P (Signed)
[name] is an 73 y.o. female.   Chief Complaint: right knee pain HPI: [name] is a 73 year old female who presented to the office with the chief complaint of right knee pain. She reports that she has had right knee pain for about a month now following a fall onto her knee. She has experienced pain, swelling, locking and catching in her right knee. MRI of the right knee revealed a tear of the posterior horn of the medial meniscus.   Past Medical History  Diagnosis Date  . Diabetes mellitus   . Hypertension   . GERD (gastroesophageal reflux disease)   . Neuropathy   . PONV (postoperative nausea and vomiting)   . High cholesterol   . Diabetic neuropathy   . Angina   . Myocardial infarction 12/02/11    "they think I might have had a light heart attack this wk"  . Pneumonia   . Bronchitis   . Hypothyroidism   . History of stomach ulcers 1970's  . Arthritis     "in my hands"  . Depression   . S/P CABG (coronary artery bypass graft), 12/04/11 12/07/2011    LIMA to LAD, SVG to OM, SVG to RCA  . Acute renal failure, 01/03/12 01/03/2012  . Diverticulosis   . Hemorrhoids   . Esophageal stricture   . History of nuclear stress test 03/10/2007    dipyridamole; low risk scan   . Coronary artery disease     DR. HILTY IS PT'S CARDIOLOGIST  . Pain     RIGHT KNEE PAIN - TORN RIGHT MEDIAL MENISCUS    Past Surgical History  Procedure Laterality Date  . Fracture surgery      "put pins both side right ankle"  . Total knee arthroplasty  ~ 2006    left  . Joint replacement    . Back surgery  2006    "cyst growing near my spine"  . Tonsillectomy  1949  . Abdominal hysterectomy  1980's  . Dilation and curettage of uterus      "a couple times"  . Cesarean section  1977  . Cataract extraction w/ intraocular lens  implant, bilateral  ~ 2010  . Cardiac catheterization  12/02/2011    mild LV dysfunction with mod hypocontractility of mid-distal anterolateral wall; CAD w/ostial tapering of L Main  with 50% diffuse ostial narrowing of LAD, 99% eccentric focal prox LAD stenosis followed by 70% prox LAD stenosis after 1st diag, 20% mid LAD narrowing; 80% ostial-to-prox L Cfx stenosis & 40-50% irregularity of RCA (Dr. Corky Downs)  . Coronary artery bypass graft  12/04/2011    Procedure: CORONARY ARTERY BYPASS GRAFTING (CABG);  Surgeon: Tharon Aquas Adelene Idler, MD;  Location: [facility];  Service: Open Heart Surgery;  Laterality: N/A;  CABG x three,  using left internal mammary artery, and right leg greater saphenous vein harvested endoscopically  . Transthoracic echocardiogram  02/19/2013    EF 42-87%, grade 1 diastolic dysfunction; mildly thickend/calcified AV leaflets; mildly calcidied MV annulus; mild TR    Family History  Problem Relation Age of Onset  . Colon cancer Neg Hx   . Breast cancer Sister     x 3  . Heart disease Father     also hyperlipidemia  . Heart disease Brother     x5; one with MI  . Heart disease Sister     x3  . Diabetes Mother     also, HTN & CVA  . Diabetes Sister  x3  . Lung cancer Sister   . Breast cancer Sister     x2   Social History:  reports that she has never smoked. She has never used smokeless tobacco. She reports that she does not drink alcohol or use illicit drugs.  Allergies:  Allergies  Allergen Reactions  . Epinephrine Other (See Comments)    Abnormal feeling. Dental exam/injection of local w/ epi.  . Other     MANGO'S - WHELPS ALL OVER  . Sulfa Antibiotics     Current outpatient prescriptions: acetaminophen (TYLENOL) 500 MG tablet, Take 500 mg by mouth every 6 (six) hours as needed. pain, Disp: , Rfl: ;   amLODipine (NORVASC) 10 MG tablet, Take 10 mg by mouth every morning., Disp: , Rfl: ;   Cholecalciferol (VITAMIN D) 2000 UNITS tablet, Take 2,000 Units by mouth daily., Disp: , Rfl: ;   Cyanocobalamin (VITAMIN B 12 PO), Take 1 tablet by mouth daily. , Disp: , Rfl:  gabapentin (NEURONTIN) 300 MG capsule, Take 300 mg by mouth 2 (two) times  daily. , Disp: , Rfl: ;   insulin glargine (LANTUS) 100 UNIT/ML injection, Inject 14 Units into the skin every morning. , Disp: , Rfl: ;   levothyroxine (SYNTHROID, LEVOTHROID) 50 MCG tablet, Take 50 mcg by mouth daily before breakfast. , Disp: , Rfl: ;  losartan (COZAAR) 50 MG tablet, Take 50 mg by mouth every morning. , Disp: , Rfl:  MAGNESIUM PO, Take 1 tablet by mouth daily. , Disp: , Rfl: ;   meclizine (ANTIVERT) 25 MG tablet, Take 25 mg by mouth 4 (four) times daily as needed. For dizziness, Disp: , Rfl: ;   meloxicam (MOBIC) 15 MG tablet, Take 15 mg by mouth daily., Disp: , Rfl: ;   Multiple Vitamins-Minerals (PRESERVISION AREDS 2 PO), Take 1 tablet by mouth daily., Disp: , Rfl: ;   omeprazole (PRILOSEC) 40 MG capsule, Take 40 mg by mouth daily., Disp: , Rfl:  Polyethyl Glycol-Propyl Glycol (SYSTANE OP), Apply 1 drop to eye 2 (two) times daily., Disp: , Rfl: ;   sitaGLIPtin-metformin (JANUMET) 50-1000 MG per tablet, Take 1 tablet by mouth 2 (two) times daily with a meal., Disp: , Rfl: ;   vitamin C (ASCORBIC ACID) 500 MG tablet, Take 500 mg by mouth daily., Disp: , Rfl: ;  vitamin E 400 UNIT capsule, Take 400 Units by mouth daily., Disp: , Rfl:   Results for orders placed during the hospital encounter of 06/25/14 (from the past 48 hour(s))  CBC     Status: Abnormal   Collection Time    06/25/14 12:40 PM      Result Value Ref Range   WBC 7.9  4.0 - 10.5 K/uL   RBC 3.60 (*) 3.87 - 5.11 MIL/uL   Hemoglobin 11.5 (*) 12.0 - 15.0 g/dL   HCT 34.3 (*) 36.0 - 46.0 %   MCV 95.3  78.0 - 100.0 fL   MCH 31.9  26.0 - 34.0 pg   MCHC 33.5  30.0 - 36.0 g/dL   RDW 12.9  11.5 - 15.5 %   Platelets 173  150 - 400 K/uL  BASIC METABOLIC PANEL     Status: Abnormal   Collection Time    06/25/14 12:40 PM      Result Value Ref Range   Sodium 145  137 - 147 mEq/L   Potassium 6.1 (*) 3.7 - 5.3 mEq/L   Chloride 110  96 - 112 mEq/L   CO2 23  19 -  32 mEq/L   Glucose, Bld 69 (*) 70 - 99 mg/dL   BUN 25 (*)  6 - 23 mg/dL   Creatinine, Ser 1.12 (*) 0.50 - 1.10 mg/dL   Calcium 9.9  8.4 - 10.5 mg/dL   GFR calc non Af Amer 47 (*) >90 mL/min   GFR calc Af Amer 55 (*) >90 mL/min   Comment: (NOTE)     The eGFR has been calculated using the CKD EPI equation.     This calculation has not been validated in all clinical situations.     eGFR's persistently <90 mL/min signify possible Chronic Kidney     Disease.   Anion gap 12  5 - 15   Dg Chest 2 View  06/25/2014   CLINICAL DATA:  Preoperative for right knee arthroscopy; history of previous MI, bronchitis  EXAM: CHEST  2 VIEW  COMPARISON:  .  PA and lateral chest of February 02, 2012  FINDINGS: The lungs are adequately inflated. There is no focal infiltrate. The heart is top-normal in size. The pulmonary vascularity is normal. The patient has undergone previous CABG. There is no pleural effusion. The mediastinum is normal in width. The observed bony thorax exhibits no acute abnormalities.  IMPRESSION: There is no active cardiopulmonary disease.   Electronically Signed   By: David  Martinique   On: 06/25/2014 13:58    Review of Systems  Constitutional: Negative.   HENT: Negative.   Eyes: Negative.   Respiratory: Negative.   Cardiovascular: Negative.   Gastrointestinal: Negative.   Genitourinary: Negative.   Musculoskeletal: Positive for back pain, falls and joint pain. Negative for myalgias and neck pain.       Right knee pain  Skin: Negative.   Neurological: Negative.   Endo/Heme/Allergies: Negative.   Psychiatric/Behavioral: Negative.    Vitals  Weight: 139 lb Height: 61in Body Surface Area: 1.65 m Body Mass Index: 26.26 kg/m Pulse: 61 (Regular)  BP: 156/64 (Sitting, Left Arm, Standard)  Physical Exam  Constitutional: She is oriented to person, place, and time. She appears well-developed and well-nourished. No distress.  HENT:  Head: Normocephalic and atraumatic.  Right Ear: External ear normal.  Left Ear: External ear normal.  Nose:  Nose normal.  Mouth/Throat: Oropharynx is clear and moist.  Eyes: Conjunctivae and EOM are normal.  Neck: Normal range of motion. Neck supple.  Cardiovascular: Normal rate, regular rhythm, normal heart sounds and intact distal pulses.   No murmur heard. Respiratory: Effort normal and breath sounds normal. No respiratory distress. She has no wheezes.  GI: Soft. She exhibits no distension. There is no tenderness.  Musculoskeletal:       Right hip: Normal.       Left hip: Normal.       Right knee: She exhibits decreased range of motion, swelling and effusion. She exhibits no erythema. Tenderness found. Medial joint line and lateral joint line tenderness noted.       Left knee: Normal.       Right lower leg: She exhibits no tenderness and no swelling.       Left lower leg: She exhibits no tenderness and no swelling.  She flexes back to about 95 degrees and lacks about 7 degrees of extension. She has a minimal effusion. There is a positive McMurray's sign. There is a healed abrasion over the lateral aspect of her knee where she fell. Incision over left TKA is healed with no signs of infection.  Neurological: She is alert and oriented to person,  place, and time. She has normal strength and normal reflexes. No sensory deficit.  Skin: No rash noted. She is not diaphoretic. No erythema.  Psychiatric: She has a normal mood and affect. Her behavior is normal.     Assessment/Plan Right knee, medial meniscus tear She needs a right knee arthroscopy with partial debridement of the medial mensicus. This will be an outpatient procedure. Risks and benefits of the surgery were discussed with the patient by Dr. Gladstone Lighter.    H&P performed by Dr. Latanya Maudlin H&P documented by Ardeen Jourdain, Leonard, Sharlette Jansma Ander Purpura 06/26/2014, 8:29 AM

## 2014-06-27 NOTE — Pre-Procedure Instructions (Signed)
preop bmet report re-faxed to Dr. Charlestine Night office - first fax failed in transmission.

## 2014-07-02 ENCOUNTER — Other Ambulatory Visit: Payer: Self-pay

## 2014-07-02 ENCOUNTER — Encounter (HOSPITAL_COMMUNITY)
Admission: RE | Disposition: A | Discharge status: Home / Self Care | Payer: Self-pay | Source: Ambulatory Visit | Attending: Orthopedic Surgery

## 2014-07-02 ENCOUNTER — Ambulatory Visit (HOSPITAL_COMMUNITY)
Admission: RE | Admit: 2014-07-02 | Discharge: 2014-07-02 | Disposition: A | Discharge status: Home / Self Care | Payer: Medicare Other | Source: Ambulatory Visit | Attending: Orthopedic Surgery | Admitting: Orthopedic Surgery

## 2014-07-02 ENCOUNTER — Ambulatory Visit (HOSPITAL_COMMUNITY): Payer: Medicare Other | Admitting: Anesthesiology

## 2014-07-02 ENCOUNTER — Encounter (HOSPITAL_COMMUNITY): Payer: Medicare Other | Admitting: Anesthesiology

## 2014-07-02 ENCOUNTER — Encounter (HOSPITAL_COMMUNITY): Payer: Self-pay | Admitting: *Deleted

## 2014-07-02 DIAGNOSIS — Y929 Unspecified place or not applicable: Secondary | ICD-10-CM | POA: Insufficient documentation

## 2014-07-02 DIAGNOSIS — Z951 Presence of aortocoronary bypass graft: Secondary | ICD-10-CM | POA: Insufficient documentation

## 2014-07-02 DIAGNOSIS — M6580 Other synovitis and tenosynovitis, unspecified site: Secondary | ICD-10-CM | POA: Insufficient documentation

## 2014-07-02 DIAGNOSIS — E7800 Pure hypercholesterolemia, unspecified: Secondary | ICD-10-CM | POA: Diagnosis not present

## 2014-07-02 DIAGNOSIS — M1710 Unilateral primary osteoarthritis, unspecified knee: Secondary | ICD-10-CM | POA: Diagnosis present

## 2014-07-02 DIAGNOSIS — IMO0002 Reserved for concepts with insufficient information to code with codable children: Secondary | ICD-10-CM | POA: Diagnosis not present

## 2014-07-02 DIAGNOSIS — I252 Old myocardial infarction: Secondary | ICD-10-CM | POA: Insufficient documentation

## 2014-07-02 DIAGNOSIS — K219 Gastro-esophageal reflux disease without esophagitis: Secondary | ICD-10-CM | POA: Diagnosis not present

## 2014-07-02 DIAGNOSIS — M19049 Primary osteoarthritis, unspecified hand: Secondary | ICD-10-CM | POA: Insufficient documentation

## 2014-07-02 DIAGNOSIS — I10 Essential (primary) hypertension: Secondary | ICD-10-CM | POA: Insufficient documentation

## 2014-07-02 DIAGNOSIS — E039 Hypothyroidism, unspecified: Secondary | ICD-10-CM | POA: Diagnosis not present

## 2014-07-02 DIAGNOSIS — Y999 Unspecified external cause status: Secondary | ICD-10-CM | POA: Diagnosis not present

## 2014-07-02 DIAGNOSIS — M1711 Unilateral primary osteoarthritis, right knee: Secondary | ICD-10-CM | POA: Diagnosis present

## 2014-07-02 DIAGNOSIS — E1149 Type 2 diabetes mellitus with other diabetic neurological complication: Secondary | ICD-10-CM | POA: Insufficient documentation

## 2014-07-02 DIAGNOSIS — I2510 Atherosclerotic heart disease of native coronary artery without angina pectoris: Secondary | ICD-10-CM | POA: Diagnosis not present

## 2014-07-02 DIAGNOSIS — E1142 Type 2 diabetes mellitus with diabetic polyneuropathy: Secondary | ICD-10-CM | POA: Diagnosis not present

## 2014-07-02 DIAGNOSIS — S83241A Other tear of medial meniscus, current injury, right knee, initial encounter: Secondary | ICD-10-CM | POA: Diagnosis present

## 2014-07-02 DIAGNOSIS — W010XXA Fall on same level from slipping, tripping and stumbling without subsequent striking against object, initial encounter: Secondary | ICD-10-CM | POA: Insufficient documentation

## 2014-07-02 HISTORY — PX: KNEE ARTHROSCOPY WITH MEDIAL MENISECTOMY: SHX5651

## 2014-07-02 HISTORY — DX: Other tear of medial meniscus, current injury, right knee, initial encounter: S83.241A

## 2014-07-02 LAB — BASIC METABOLIC PANEL
Anion gap: 15 (ref 5–15)
BUN: 37 mg/dL — ABNORMAL HIGH (ref 6–23)
CO2: 19 mEq/L (ref 19–32)
Calcium: 9.4 mg/dL (ref 8.4–10.5)
Chloride: 107 mEq/L (ref 96–112)
Creatinine, Ser: 1.27 mg/dL — ABNORMAL HIGH (ref 0.50–1.10)
GFR calc Af Amer: 47 mL/min — ABNORMAL LOW (ref >90)
GFR calc non Af Amer: 41 mL/min — ABNORMAL LOW (ref >90)
Glucose, Bld: 98 mg/dL (ref 70–99)
Potassium: 5.6 mEq/L — ABNORMAL HIGH (ref 3.7–5.3)
Sodium: 141 mEq/L (ref 137–147)

## 2014-07-02 LAB — GLUCOSE, CAPILLARY
Glucose-Capillary: 92 mg/dL (ref 70–99)
Glucose-Capillary: 88 mg/dL (ref 70–99)
Glucose-Capillary: 89 mg/dL (ref 70–99)
Glucose-Capillary: 185 mg/dL — ABNORMAL HIGH (ref 70–99)

## 2014-07-02 LAB — POCT I-STAT 4, (NA,K, GLUC, HGB,HCT)
Glucose, Bld: 88 mg/dL (ref 70–99)
HCT: 35 % — ABNORMAL LOW (ref 36.0–46.0)
Hemoglobin: 11.9 g/dL — ABNORMAL LOW (ref 12.0–15.0)
Potassium: 6 mEq/L — ABNORMAL HIGH (ref 3.7–5.3)
Sodium: 142 mEq/L (ref 137–147)

## 2014-07-02 SURGERY — ARTHROSCOPY, KNEE, WITH MEDIAL MENISCECTOMY
Anesthesia: General | Site: Knee | Laterality: Right

## 2014-07-02 MED ORDER — SCOPOLAMINE 1 MG/3DAYS TD PT72
MEDICATED_PATCH | TRANSDERMAL | Status: DC | PRN
  Administered 2014-07-02: 1 via TRANSDERMAL

## 2014-07-02 MED ORDER — CEFAZOLIN SODIUM-DEXTROSE 2-3 GM-% IV SOLR
2.0000 g | INTRAVENOUS | Status: AC
Start: 2014-07-02 — End: 2014-07-02
  Administered 2014-07-02: 2 g via INTRAVENOUS

## 2014-07-02 MED ORDER — HYDROMORPHONE HCL 2 MG PO TABS: 2.0000 mg | ORAL_TABLET | ORAL | Status: DC | PRN

## 2014-07-02 MED ORDER — BUPIVACAINE HCL (PF) 0.25 % IJ SOLN
INTRAMUSCULAR | Status: DC | PRN
  Administered 2014-07-02: 30 mL

## 2014-07-02 MED ORDER — BACITRACIN ZINC 500 UNIT/GM EX OINT
TOPICAL_OINTMENT | CUTANEOUS | Status: AC
Start: 2014-07-02 — End: 2014-07-02
  Filled 2014-07-02: qty 28.35

## 2014-07-02 MED ORDER — BUPIVACAINE HCL (PF) 0.25 % IJ SOLN
INTRAMUSCULAR | Status: AC
Start: 2014-07-02 — End: 2014-07-02
  Filled 2014-07-02: qty 30

## 2014-07-02 MED ORDER — PROPOFOL 10 MG/ML IV BOLUS
INTRAVENOUS | Status: AC
  Filled 2014-07-02: qty 20

## 2014-07-02 MED ORDER — LACTATED RINGERS IV SOLN
INTRAVENOUS | Status: DC
  Administered 2014-07-02: 12:00:00 via INTRAVENOUS

## 2014-07-02 MED ORDER — BUPIVACAINE-EPINEPHRINE 0.25% -1:200000 IJ SOLN
INTRAMUSCULAR | Status: AC
  Filled 2014-07-02: qty 1

## 2014-07-02 MED ORDER — LIDOCAINE HCL (CARDIAC) 20 MG/ML IV SOLN
INTRAVENOUS | Status: AC
  Filled 2014-07-02: qty 5

## 2014-07-02 MED ORDER — ONDANSETRON HCL 4 MG/2ML IJ SOLN
INTRAMUSCULAR | Status: AC
  Filled 2014-07-02: qty 2

## 2014-07-02 MED ORDER — PROPOFOL 10 MG/ML IV BOLUS
INTRAVENOUS | Status: DC | PRN
  Administered 2014-07-02: 140 mg via INTRAVENOUS

## 2014-07-02 MED ORDER — FENTANYL CITRATE 0.05 MG/ML IJ SOLN
INTRAMUSCULAR | Status: AC
  Filled 2014-07-02: qty 2

## 2014-07-02 MED ORDER — ONDANSETRON HCL 4 MG/2ML IJ SOLN
INTRAMUSCULAR | Status: DC | PRN
Start: 2014-07-02 — End: 2014-07-02
  Administered 2014-07-02: 4 mg via INTRAVENOUS

## 2014-07-02 MED ORDER — FENTANYL CITRATE 0.05 MG/ML IJ SOLN: 25.0000 ug | INTRAMUSCULAR | Status: DC | PRN

## 2014-07-02 MED ORDER — LIDOCAINE HCL (CARDIAC) 20 MG/ML IV SOLN
INTRAVENOUS | Status: DC | PRN
  Administered 2014-07-02: 100 mg via INTRAVENOUS

## 2014-07-02 MED ORDER — PROMETHAZINE HCL 25 MG/ML IJ SOLN: 6.2500 mg | INTRAMUSCULAR | Status: DC | PRN

## 2014-07-02 MED ORDER — FENTANYL CITRATE 0.05 MG/ML IJ SOLN
INTRAMUSCULAR | Status: DC | PRN
  Administered 2014-07-02 (×2): 25 ug via INTRAVENOUS

## 2014-07-02 MED ORDER — CEFAZOLIN SODIUM-DEXTROSE 2-3 GM-% IV SOLR
INTRAVENOUS | Status: AC
  Filled 2014-07-02: qty 50

## 2014-07-02 MED ORDER — DEXAMETHASONE SODIUM PHOSPHATE 10 MG/ML IJ SOLN
INTRAMUSCULAR | Status: AC
  Filled 2014-07-02: qty 1

## 2014-07-02 MED ORDER — DEXAMETHASONE SODIUM PHOSPHATE 10 MG/ML IJ SOLN
INTRAMUSCULAR | Status: DC | PRN
  Administered 2014-07-02: 10 mg via INTRAVENOUS

## 2014-07-02 MED ORDER — SCOPOLAMINE 1 MG/3DAYS TD PT72
MEDICATED_PATCH | TRANSDERMAL | Status: AC
  Filled 2014-07-02: qty 1

## 2014-07-02 MED ORDER — BACITRACIN ZINC 500 UNIT/GM EX OINT
TOPICAL_OINTMENT | CUTANEOUS | Status: DC | PRN
  Administered 2014-07-02: 1 via TOPICAL

## 2014-07-02 MED ORDER — LACTATED RINGERS IR SOLN
Status: DC | PRN
  Administered 2014-07-02: 6000 mL

## 2014-07-02 SURGICAL SUPPLY — 27 items
BANDAGE ELASTIC 4 VELCRO ST LF (GAUZE/BANDAGES/DRESSINGS) ×2 IMPLANT
BLADE GREAT WHITE 4.2 (BLADE) IMPLANT
BNDG COHESIVE 6X5 TAN STRL LF (GAUZE/BANDAGES/DRESSINGS) ×2 IMPLANT
BNDG GAUZE ELAST 4 BULKY (GAUZE/BANDAGES/DRESSINGS) ×2 IMPLANT
CLOTH BEACON ORANGE TIMEOUT ST (SAFETY) ×2 IMPLANT
COUNTER NEEDLE 20 DBL MAG RED (NEEDLE) ×2 IMPLANT
DRAPE LG THREE QUARTER DISP (DRAPES) ×2 IMPLANT
DRSG ADAPTIC 3X8 NADH LF (GAUZE/BANDAGES/DRESSINGS) ×2 IMPLANT
DRSG PAD ABDOMINAL 8X10 ST (GAUZE/BANDAGES/DRESSINGS) ×4 IMPLANT
DURAPREP 26ML APPLICATOR (WOUND CARE) ×2 IMPLANT
GAUZE SPONGE 4X4 12PLY STRL (GAUZE/BANDAGES/DRESSINGS) ×2 IMPLANT
GLOVE BIOGEL PI IND STRL 8 (GLOVE) ×1 IMPLANT
GLOVE BIOGEL PI INDICATOR 8 (GLOVE) ×1
GLOVE ECLIPSE 8.0 STRL XLNG CF (GLOVE) ×2 IMPLANT
GOWN STRL REUS W/TWL XL LVL3 (GOWN DISPOSABLE) ×2 IMPLANT
MANIFOLD NEPTUNE II (INSTRUMENTS) ×2 IMPLANT
PACK ARTHROSCOPY WL (CUSTOM PROCEDURE TRAY) ×2 IMPLANT
PACK ICE MAXI GEL EZY WRAP (MISCELLANEOUS) ×2 IMPLANT
PAD ABD 8X10 STRL (GAUZE/BANDAGES/DRESSINGS) ×2 IMPLANT
PAD MASON LEG HOLDER (PIN) ×2 IMPLANT
SET ARTHROSCOPY TUBING (MISCELLANEOUS) ×1
SET ARTHROSCOPY TUBING LN (MISCELLANEOUS) ×1 IMPLANT
SUT ETHILON 3 0 PS 1 (SUTURE) ×2 IMPLANT
TOWEL OR 17X26 10 PK STRL BLUE (TOWEL DISPOSABLE) ×2 IMPLANT
TUBING CONNECTING 10 (TUBING) IMPLANT
WAND 90 DEG TURBOVAC W/CORD (SURGICAL WAND) ×2 IMPLANT
WRAP KNEE MAXI GEL POST OP (GAUZE/BANDAGES/DRESSINGS) ×2 IMPLANT

## 2014-07-02 NOTE — Discharge Instructions (Signed)

## 2014-07-02 NOTE — Anesthesia Postprocedure Evaluation (Signed)
   Anesthesia Post-op Note  Patient: [name]  Procedure(s) Performed: Procedure(s) (LRB): RIGHT KNEE ARTHROSCOPY WITH PARTIAL MEDIAL MENISTECTOMY, ABRASION CONDROPLASTYU OF PATELLA,ABRASION CONDROPLASTY OF MEDIAL FEMEROL CONDYL, MICROFRACTURE OF MEDIAL FEMEROL CONDYL (Right)  Patient Location: PACU  Anesthesia Type: General  Level of Consciousness: awake and alert   Airway and Oxygen Therapy: Patient Spontanous Breathing  Post-op Pain: mild  Post-op Assessment: Post-op Vital signs reviewed, Patient's Cardiovascular Status Stable, Respiratory Function Stable, Patent Airway and No signs of Nausea or vomiting  Last Vitals:  Filed Vitals:   07/02/14 1415  BP: 149/52  Pulse: 56  Temp: 36.3 C  Resp: 11    Post-op Vital Signs: stable   Complications: No apparent anesthesia complications

## 2014-07-02 NOTE — Anesthesia Preprocedure Evaluation (Addendum)
Anesthesia Evaluation  Patient identified by MRN, date of birth, ID band Patient awake    Reviewed: Allergy & Precautions, H&P , NPO status , Patient's Chart, lab work & pertinent test results  History of Anesthesia Complications (+) PONV and history of anesthetic complications  Airway Mallampati: II TM Distance: >3 FB Neck ROM: Full    Dental  (+) Edentulous Upper   Pulmonary pneumonia -, resolved,  breath sounds clear to auscultation  Pulmonary exam normal       Cardiovascular Exercise Tolerance: Poor hypertension, - angina+ CAD, + Past MI and +CHF negative cardio ROS  Rhythm:Regular Rate:Normal  Patient denies angina, PND, increased peripheral edema, palpitations. She reports that she is in her normal state of health. She reports that she has stopped taking her lasix but denies any worsening SOB or peripheral edema   Neuro/Psych Anxiety Depression  Neuromuscular disease    GI/Hepatic negative GI ROS, Neg liver ROS, GERD-  Medicated and Controlled,  Endo/Other  negative endocrine ROSdiabetes, Well Controlled, Type 2Hypothyroidism   Renal/GU ARFRenal diseasenegative Renal ROS  negative genitourinary   Musculoskeletal negative musculoskeletal ROS (+)   Abdominal   Peds negative pediatric ROS (+)  Hematology negative hematology ROS (+)   Anesthesia Other Findings Patient denies any change in day to day activity, symptoms, weight gain. She denies ever feeling muscle weakness, twitching, spasm, nausea, vomiting. She reports that she is in her current state of health. It was noted that her potassium was elevated 9/1 to 6.1 and her repeat from an istat today was 6.0. An EKG was performed and comparable to her previous with no signs of peaked T waves or other concerning signs. Discussed case with Dr. Rushie Nyhan and decision made to proceed with the case and have close follow up with her primary care physician.   Reproductive/Obstetrics negative OB ROS                          Anesthesia Physical Anesthesia Plan  ASA: III  Anesthesia Plan: General   Post-op Pain Management:    Induction: Intravenous  Airway Management Planned: LMA  Additional Equipment:   Intra-op Plan:   Post-operative Plan: Extubation in OR  Informed Consent: I have reviewed the patients History and Physical, chart, labs and discussed the procedure including the risks, benefits and alternatives for the proposed anesthesia with the patient or authorized representative who has indicated his/her understanding and acceptance.   Dental advisory given  Plan Discussed with:   Anesthesia Plan Comments:         Anesthesia Quick Evaluation

## 2014-07-02 NOTE — Interval H&P Note (Signed)
History and Physical Interval Note:  07/02/2014 12:12 PM  [name]  has presented today for surgery, with the diagnosis of RIGHT KNEE MEDIAL MENISCAL TEAR   The various methods of treatment have been discussed with the patient and family. After consideration of risks, benefits and other options for treatment, the patient has consented to  Procedure(s): RIGHT KNEE ARTHROSCOPY WITH MEDIAL MENISCAL TEAR  (Right) as a surgical intervention .  The patient's history has been reviewed, patient examined, no change in status, stable for surgery.  I have reviewed the patient's chart and labs.  Questions were answered to the patient's satisfaction.     GIOFFRE,RONALD A

## 2014-07-02 NOTE — Transfer of Care (Signed)
Immediate Anesthesia Transfer of Care Note  Patient: [name]  Procedure(s) Performed: Procedure(s): RIGHT KNEE ARTHROSCOPY WITH PARTIAL MEDIAL MENISTECTOMY, ABRASION CONDROPLASTYU OF PATELLA,ABRASION CONDROPLASTY OF MEDIAL FEMEROL CONDYL, MICROFRACTURE OF MEDIAL FEMEROL CONDYL (Right)  Patient Location: PACU  Anesthesia Type:General  Level of Consciousness: sedated  Airway & Oxygen Therapy: Patient Spontanous Breathing and Patient connected to face mask oxygen  Post-op Assessment: Report given to PACU RN and Post -op Vital signs reviewed and stable  Post vital signs: Reviewed and stable  Complications: No apparent anesthesia complications

## 2014-07-02 NOTE — Brief Op Note (Signed)
07/02/2014  1:43 PM  PATIENT:  [name]  73 y.o. female  PRE-OPERATIVE DIAGNOSIS:  RIGHT KNEE MEDIAL MENISCAL TEAR and Osteoarthritis   POST-OPERATIVE DIAGNOSIS:  RIGHT KNEE MEDIAL MENISCAL TEAR and Osteoarthritis  PROCEDURE:  Procedure(s): RIGHT KNEE ARTHROSCOPY WITH PARTIAL MEDIAL MENISTECTOMY, ABRASION CONDROPLASTYU OF PATELLA,ABRASION CONDROPLASTY OF MEDIAL FEMEROL CONDYL, MICROFRACTURE OF MEDIAL FEMEROL CONDYL (Right)  SURGEON:  Surgeon(s) and Role:    * Tobi Bastos, MD - Primary     ASSISTANTS:OR Nurse  ANESTHESIA:   general  EBL:  Total I/O In: 800 [I.V.:800] Out: -   BLOOD ADMINISTERED:none  DRAINS: none   LOCAL MEDICATIONS USED:  MARCAINE  30cc of 0.25% Plain.   SPECIMEN:  No Specimen  DISPOSITION OF SPECIMEN:  N/A  COUNTS:  YES  TOURNIQUET:  * No tourniquets in log *  DICTATION: .Other Dictation: Dictation Number [id number]  PLAN OF CARE: Discharge to home after PACU  PATIENT DISPOSITION:  PACU - hemodynamically stable.   Delay start of Pharmacological VTE agent (>24hrs) due to surgical blood loss or risk of bleeding: yes

## 2014-07-03 ENCOUNTER — Encounter (HOSPITAL_COMMUNITY): Payer: Self-pay | Admitting: Orthopedic Surgery

## 2014-07-03 NOTE — Op Note (Signed)
[name], [name]               ACCOUNT NO.:  [id number]  MEDICAL RECORD NO.:  [id number]  LOCATION:  [facility]                         FACILITY:  [facility]  PHYSICIAN:  Kipp Brood. Gioffre, M.D.DATE OF BIRTH:  27-Oct-1940  DATE OF PROCEDURE:  07/02/2014 DATE OF DISCHARGE:  07/02/2014                              OPERATIVE REPORT   SURGEON:  Jori Moll A. Gioffre, M.D.  ASSISTANT:  OR nurse.  PREOPERATIVE DIAGNOSES: 1. Osteoarthritis of the right knee. 2. Torn medial meniscus, posterior horn, right knee.  POSTOPERATIVE DIAGNOSES: 1. Osteoarthritis of the right knee. 2. Torn medial meniscus, posterior horn, right knee.  OPERATIONS: 1. Diagnostic arthroscopy, right knee. 2. Medial meniscectomy, right knee. 3. Abrasion chondroplasty of patella, right knee. 4. Abrasion chondroplasty of medial femoral condyle, right knee. 5. Microfracture technique, medial femoral condyle, right knee.  DESCRIPTION OF PROCEDURE:  Under general anesthesia, routine orthopedic prepping and draping of the right lower extremity was carried out.  The patient had 2 g of IV Ancef.  The appropriate time-out was first carried out.  I also marked the appropriate right leg in the holding area.  At this time, a small punctate incision was made in the suprapatellar pouch.  Inflow cannula was inserted and the knee was distended with saline.  Another small punctate incision was made laterally.  The arthroscope was entered from the lateral approach and a complete diagnostic arthroscopy was carried out.  I went up in the suprapatellar pouch.  She had some mild-to-moderate synovitis, but she had rather significant chondromalacia of the patella.  I did an abrasion chondroplasty of the patella down the bleeding bone.  I then went over to the lateral joint.  The lateral joint showed some very mild early arthritic changes.  Lateral meniscus was intact.  Cruciates were intact. I went over to the medial joint where the highlight  main problem was. The cartilage of the medial femoral condyle lately was hanging off like orange peel off on orange.  I first did an abrasion chondroplasty down the bleeding bone.  I then utilized the microfracture technique for the medial femoral condyle.  We then went down, did a medial meniscectomy of the posterior horn as well.  I thoroughly irrigated out the knee.  I feel some day highlight that she may need a total knee replacement. Thoroughly irrigated out the knee, removed all the fluid, closed all three punctate incisions with 3-0 nylon suture.  I injected 30 mL of 0.25% Marcaine plain into the knee and a sterile Neosporin dressing was applied.  Postop, she will be on aspirin 325 mg b.i.d. as an anticoagulant, Dilaudid 2 mg every 3-4 hours p.r.n. for pain.  I will see have her on a walker, partial weightbearing as tolerated.  She will see me in the office 10-12 days or prior to representing high fevers, any signs of infection, any calf tenderness, calf swelling, etc.          ______________________________ Kipp Brood. Gladstone Lighter, M.D.     RAG/MEDQ  D:  07/02/2014  T:  07/02/2014  Job:  [id number]

## 2014-07-03 NOTE — Addendum Note (Signed)
Addendum created 07/03/14 1034 by Lind Covert, CRNA   Modules edited: Anesthesia Responsible Staff

## 2014-08-07 ENCOUNTER — Other Ambulatory Visit (HOSPITAL_COMMUNITY): Payer: Self-pay | Admitting: Internal Medicine

## 2014-08-07 NOTE — Telephone Encounter (Signed)
Rx was sent to pharmacy electronically. 

## 2014-08-28 ENCOUNTER — Other Ambulatory Visit: Payer: Self-pay

## 2014-08-28 MED ORDER — AMLODIPINE BESYLATE 10 MG PO TABS: 10.0000 mg | ORAL_TABLET | Freq: Every morning | ORAL | Status: DC

## 2014-08-28 NOTE — Telephone Encounter (Signed)
Rx sent to pharmacy   

## 2014-09-13 ENCOUNTER — Ambulatory Visit (INDEPENDENT_AMBULATORY_CARE_PROVIDER_SITE_OTHER): Payer: Medicare Other | Admitting: Internal Medicine

## 2014-09-13 ENCOUNTER — Encounter: Payer: Self-pay | Admitting: Internal Medicine

## 2014-09-13 VITALS — BP 170/68 | HR 58 | Ht 61.0 in | Wt 142.6 lb

## 2014-09-13 DIAGNOSIS — R413 Other amnesia: Secondary | ICD-10-CM

## 2014-09-13 DIAGNOSIS — R0789 Other chest pain: Secondary | ICD-10-CM

## 2014-09-13 DIAGNOSIS — I10 Essential (primary) hypertension: Secondary | ICD-10-CM

## 2014-09-13 DIAGNOSIS — Z820 Family history of epilepsy and other diseases of the nervous system: Secondary | ICD-10-CM

## 2014-09-13 DIAGNOSIS — E785 Hyperlipidemia, unspecified: Secondary | ICD-10-CM

## 2014-09-13 DIAGNOSIS — I480 Paroxysmal atrial fibrillation: Secondary | ICD-10-CM

## 2014-09-13 DIAGNOSIS — Z951 Presence of aortocoronary bypass graft: Secondary | ICD-10-CM

## 2014-09-13 MED ORDER — ROSUVASTATIN CALCIUM 5 MG PO TABS: 5.0000 mg | ORAL_TABLET | ORAL | Status: DC

## 2014-09-13 NOTE — Patient Instructions (Addendum)
Your physician has recommended you make the following change in your medication: START crestor 5mg  every other day   Please have fasting lab work in 3 months   Your physician wants you to follow-up in: 6 months with Dr. Theresa Duty will receive a reminder letter in the mail two months in advance. If you don't receive a letter, please call our office to schedule the follow-up appointment.  You have been referred to Dr. Minus [facility]

## 2014-09-16 ENCOUNTER — Encounter: Payer: Self-pay | Admitting: Internal Medicine

## 2014-09-16 DIAGNOSIS — R0789 Other chest pain: Secondary | ICD-10-CM | POA: Insufficient documentation

## 2014-09-16 NOTE — Progress Notes (Signed)
OFFICE NOTE  Chief Complaint:  Chest tightness  Primary Care Physician: Haywood Pao, MD  HPI:  [name] is a pleasant 73 year old female with history of coronary disease and CABG x 3 vessels in 2013, LIMA to LAD, SVG to OM and SVG to right coronary. She underwent an echocardiogram which demonstrates normal EF of 55% to 65%. However, there is stage 1 diastolic dysfunction and evidence for elevated mean left atrial filling pressure. There were mildly thickened calcified aortic valve leaflets but no stenosis and mild tricuspid regurgitation. At the time, I started her on Lasix 20 mg daily. A BMP was obtained that day which indicated mild elevation at 75.5, creatinine was 1.16. She has been taking the Lasix with improvement in her lower extremity swelling although shortness of breath seems to be about the same. Again, she notes it is only with marked exertion, not necessarily with normal activities. However, she does have ongoing fatigue and easy fatigability. This may be due to just poor exercise tolerance. She also reported that recently you started her on a new SLGT2 diabetic medicine and she said that she took several doses of that, which I believe was Invokana, and she became markedly dizzy with that. She has since stopped that medication. Again, overall the patient is fairly stable with a normal ejection fraction. She recently had bypass and is not having any anginal symptoms and therefore I do not suspect that this is an issue related to her bypass grafts.   Her lower extremity swelling he has noted to resolve. She thinks that she is doing quite well at this time. She stopped taking her Lasix because she felt that she did not need it, but I requested that she restart 20 mg daily. She does have diastolic dysfunction but her weight has been fairly stable. She denies any chest pain or palpitations.  She also reports that she since been started on insulin by her primary care provider and  that her glucose levels are better than they have been in some time.    [name] returns today and is doing fairly well. She does report some chest tightness with very heavy activity however doing most activities fairly normal. Her blood pressure is notably elevated today 170/68. EKG does not show any ischemic changes.  She is not currently on cholesterol medication due to intolerance in the past. Finally, is notable that she's had regressive memory loss recently. There is a family history of Alzheimer's disease.  PMHx:  Past Medical History  Diagnosis Date  . Diabetes mellitus   . Hypertension   . GERD (gastroesophageal reflux disease)   . Neuropathy   . PONV (postoperative nausea and vomiting)   . High cholesterol   . Diabetic neuropathy   . Angina   . Myocardial infarction 12/02/11    "they think I might have had a light heart attack this wk"  . Pneumonia   . Bronchitis   . Hypothyroidism   . History of stomach ulcers 1970's  . Arthritis     "in my hands"  . Depression   . S/P CABG (coronary artery bypass graft), 12/04/11 12/07/2011    LIMA to LAD, SVG to OM, SVG to RCA  . Acute renal failure, 01/03/12 01/03/2012  . Diverticulosis   . Hemorrhoids   . Esophageal stricture   . History of nuclear stress test 03/10/2007    dipyridamole; low risk scan   . Coronary artery disease     DR. HILTY IS  PT'S CARDIOLOGIST  . Pain     RIGHT KNEE PAIN - TORN RIGHT MEDIAL MENISCUS    Past Surgical History  Procedure Laterality Date  . Fracture surgery      "put pins both side right ankle"  . Total knee arthroplasty  ~ 2006    left  . Joint replacement    . Back surgery  2006    "cyst growing near my spine"  . Tonsillectomy  1949  . Abdominal hysterectomy  1980's  . Dilation and curettage of uterus      "a couple times"  . Cesarean section  1977  . Cataract extraction w/ intraocular lens  implant, bilateral  ~ 2010  . Cardiac catheterization  12/02/2011    mild LV dysfunction with mod  hypocontractility of mid-distal anterolateral wall; CAD w/ostial tapering of L [place] with 50% diffuse ostial narrowing of LAD, 99% eccentric focal prox LAD stenosis followed by 70% prox LAD stenosis after 1st diag, 20% mid LAD narrowing; 80% ostial-to-prox L Cfx stenosis & 40-50% irregularity of RCA (Dr. Corky Downs)  . Coronary artery bypass graft  12/04/2011    Procedure: CORONARY ARTERY BYPASS GRAFTING (CABG);  Surgeon: Tharon Aquas Adelene Idler, MD;  Location: [facility];  Service: Open Heart Surgery;  Laterality: N/A;  CABG x three,  using left internal mammary artery, and right leg greater saphenous vein harvested endoscopically  . Transthoracic echocardiogram  02/19/2013    EF 0000000, grade 1 diastolic dysfunction; mildly thickend/calcified AV leaflets; mildly calcidied MV annulus; mild TR  . Knee arthroscopy with medial menisectomy Right 07/02/2014    Procedure: RIGHT KNEE ARTHROSCOPY WITH PARTIAL MEDIAL MENISTECTOMY, ABRASION CONDROPLASTYU OF PATELLA,ABRASION CONDROPLASTY OF MEDIAL FEMEROL CONDYL, MICROFRACTURE OF MEDIAL FEMEROL CONDYL;  Surgeon: Tobi Bastos, MD;  Location: [facility];  Service: Orthopedics;  Laterality: Right;    FAMHx:  Family History  Problem Relation Age of Onset  . Colon cancer Neg Hx   . Breast cancer Sister     x 3  . Heart disease Father     also hyperlipidemia  . Heart disease Brother     x5; one with MI  . Heart disease Sister     x3  . Diabetes Mother     also, HTN & CVA  . Diabetes Sister     x3  . Lung cancer Sister   . Breast cancer Sister     x2    SOCHx:   reports that she has never smoked. She has never used smokeless tobacco. She reports that she does not drink alcohol or use illicit drugs.  ALLERGIES:  Allergies  Allergen Reactions  . Epinephrine Other (See Comments)    Abnormal feeling. Dental exam/injection of local w/ epi.  . Other     MANGO'S - WHELPS ALL OVER  . Sulfa Antibiotics     ROS: A comprehensive review of systems was negative except  for: Constitutional: positive for fatigue Cardiovascular: positive for chest pressure/discomfort Neurological: positive for memory problems  HOME MEDS: Current Outpatient Prescriptions  Medication Sig Dispense Refill  . amLODipine (NORVASC) 10 MG tablet Take 1 tablet (10 mg total) by mouth every morning. 30 tablet 6  . Cholecalciferol (VITAMIN D) 2000 UNITS tablet Take 2,000 Units by mouth daily.    . Cyanocobalamin (VITAMIN B 12 PO) Take 1 tablet by mouth daily.     . furosemide (LASIX) 20 MG tablet TAKE ONE TABLET BY MOUTH ONCE DAILY 30 tablet 7  . gabapentin (NEURONTIN) 300 MG  capsule Take 300 mg by mouth 2 (two) times daily.     Marland Kitchen HYDROmorphone (DILAUDID) 2 MG tablet Take 1 tablet (2 mg total) by mouth every 4 (four) hours as needed for severe pain. 40 tablet 0  . insulin glargine (LANTUS) 100 UNIT/ML injection Inject 14 Units into the skin every morning.     Marland Kitchen levothyroxine (SYNTHROID, LEVOTHROID) 50 MCG tablet Take 50 mcg by mouth daily before breakfast.     . losartan (COZAAR) 50 MG tablet Take 50 mg by mouth every morning.     Marland Kitchen MAGNESIUM PO Take 1 tablet by mouth daily.     . meclizine (ANTIVERT) 25 MG tablet Take 25 mg by mouth 4 (four) times daily as needed. For dizziness    . meloxicam (MOBIC) 15 MG tablet Take 15 mg by mouth daily.    . Multiple Vitamins-Minerals (PRESERVISION AREDS 2 PO) Take 1 tablet by mouth daily.    Marland Kitchen omeprazole (PRILOSEC) 40 MG capsule Take 40 mg by mouth daily.    Vladimir Faster Glycol-Propyl Glycol (SYSTANE OP) Apply 1 drop to eye 2 (two) times daily.    . sitaGLIPtin-metformin (JANUMET) 50-1000 MG per tablet Take 1 tablet by mouth 2 (two) times daily with a meal.    . vitamin C (ASCORBIC ACID) 500 MG tablet Take 500 mg by mouth daily.    . vitamin E 400 UNIT capsule Take 400 Units by mouth daily.    . rosuvastatin (CRESTOR) 5 MG tablet Take 1 tablet (5 mg total) by mouth every other day. 7 tablet 0   No current facility-administered medications for this  visit.    LABS/IMAGING: No results found for this or any previous visit (from the past 48 hour(s)). No results found.  VITALS: BP 170/68 mmHg  Pulse 58  Ht 5\' 1"  (1.549 m)  Wt 142 lb 9.6 oz (64.683 kg)  BMI 26.96 kg/m2  EXAM: General appearance: alert and no distress Neck: no adenopathy, no carotid bruit, no JVD, supple, symmetrical, trachea midline and thyroid not enlarged, symmetric, no tenderness/mass/nodules Lungs: clear to auscultation bilaterally Heart: regular rate and rhythm, S1, S2 normal, no murmur, click, rub or gallop Abdomen: soft, non-tender; bowel sounds normal; no masses,  no organomegaly Extremities: 1+ right lower extremity and 2+ left lower extremity edema, bilateral erythema worse on the left than the right, warmth and a large 2 cm hematoma on the left leg Pulses: 2+ and symmetric Skin: Bilateral erythema Neurologic: Grossly normal  EKG: Sinus bradycardia 58  ASSESSMENT: 1. Coronary artery disease status post CABG x 3 vessels in 2013 2. Diabetes type 2 - now on insulin 3. Hypertension 4. Dyslipidemia 5. Peripheral neuropathy - secondary to diabetes 6. Chest pressure with marked exertion 7. Progressive memory loss  PLAN: 1.   Ms. Cola has had improvement in her lower extremity swelling. Her blood sugars better controlled on insulin. Blood pressure is elevated today and may be a cause for her chest discomfort with marked exertion. I do not see a need for repeat stress testing at this point. She has reported progressive memory loss and I would like to refer to neurology for further evaluation. There is a family history of Alzheimer's disease. I've encouraged her to continue to work on some walking and exercise which will help with her energy level. Finally, it would be helpful for her to be on statin medication due to her bypass grafts. I recommend starting low-dose Crestor 5 mg every other day she was intolerant to Lipitor  in the past and was able to take  Crestor 10 mg daily but did have side effects. Plan to see her back in 6 months.  [name], MD, [facility] Attending Cardiologist [facility] 09/16/2014, 9:51 AM

## 2014-09-26 ENCOUNTER — Telehealth: Payer: Self-pay | Admitting: Internal Medicine

## 2014-09-26 NOTE — Telephone Encounter (Signed)
Returned call to pt. She states that her BPs have been running high intermittently. Some systolic readings in [id number], 190s. Most of her readings have been "normal", per pt. She also reports "inner ear problems" and has felt dizzy, but she reports that this has been resolved with meclizine. Also reports slight swelling in legs when missing doses of Lasix. Pt has been seen recently in office and stated she called "just so you all would know what was going on" but did not need to be re-seen. I advised pt to call us if she feels she does need to be seen again or if any of her symptoms worsen. Advised to take meds as prescribed and to keep a log of BP readings.

## 2014-09-26 NOTE — Telephone Encounter (Signed)
Please call,blood pressure been up the last few days.

## 2014-10-02 ENCOUNTER — Other Ambulatory Visit: Payer: Self-pay | Admitting: Internal Medicine

## 2014-10-02 DIAGNOSIS — Z1231 Encounter for screening mammogram for malignant neoplasm of breast: Secondary | ICD-10-CM

## 2014-10-03 ENCOUNTER — Encounter (HOSPITAL_COMMUNITY): Payer: Self-pay | Admitting: Cardiovascular Disease

## 2014-10-03 ENCOUNTER — Telehealth: Payer: Self-pay | Admitting: Internal Medicine

## 2014-10-03 MED ORDER — FUROSEMIDE 20 MG PO TABS: 20.0000 mg | ORAL_TABLET | Freq: Every day | ORAL | Status: DC

## 2014-10-03 NOTE — Telephone Encounter (Addendum)
Spoke with pt, she worked out in her yard on Saturday and now her legs and feet are really swollen and they wont go down. They are swollen in the morning when she gets up, she feels her abdomen is also bigger. She has not taking the furosemide because she says when she takes it her legs swell more. If she does not take the furosemide she reports her legs do not swell. Her last dose was sometime last week. She thinks she has gained about 5 lbs in 2 weeks. She denies SOB. Patient instructed to restart the furosemide at 20 mg once daily. She will call back after the first of the week if no improvement.

## 2014-10-03 NOTE — Telephone Encounter (Signed)
Pt called in stating that she has been having some swelling in her legs and feet land she would like to know what to do to help the swelling . Please call  Thanks

## 2014-10-29 ENCOUNTER — Ambulatory Visit
Admission: RE | Admit: 2014-10-29 | Discharge: 2014-10-29 | Disposition: A | Discharge status: Home / Self Care | Payer: Medicare Other | Source: Ambulatory Visit | Attending: Internal Medicine | Admitting: Internal Medicine

## 2014-10-29 DIAGNOSIS — Z1231 Encounter for screening mammogram for malignant neoplasm of breast: Secondary | ICD-10-CM

## 2014-12-04 ENCOUNTER — Other Ambulatory Visit: Payer: Self-pay | Admitting: Internal Medicine

## 2015-02-04 ENCOUNTER — Telehealth: Payer: Self-pay | Admitting: Internal Medicine

## 2015-02-04 NOTE — Telephone Encounter (Signed)
Left message for pt to call back.  Pt states she is having problems with bloating, indigestion, stomach discomfort and reflux. Pt is requesting to be seen, states she is very uncomfortable. Pt scheduled to see Lori Hvozdovic, PA-C 02/07/15@2 :45pm. Pt aware of appt.

## 2015-02-07 ENCOUNTER — Encounter: Payer: Self-pay | Admitting: Physician Assistant

## 2015-02-07 ENCOUNTER — Ambulatory Visit (INDEPENDENT_AMBULATORY_CARE_PROVIDER_SITE_OTHER): Payer: Medicare Other | Admitting: Physician Assistant

## 2015-02-07 VITALS — BP 140/60 | HR 60 | Ht 60.0 in | Wt 138.2 lb

## 2015-02-07 DIAGNOSIS — K219 Gastro-esophageal reflux disease without esophagitis: Secondary | ICD-10-CM | POA: Diagnosis not present

## 2015-02-07 DIAGNOSIS — R1310 Dysphagia, unspecified: Secondary | ICD-10-CM | POA: Diagnosis not present

## 2015-02-07 MED ORDER — SUCRALFATE 1 G PO TABS: 1.0000 g | ORAL_TABLET | Freq: Three times a day (TID) | ORAL | Status: DC

## 2015-02-07 MED ORDER — PANTOPRAZOLE SODIUM 40 MG PO TBEC: 40.0000 mg | DELAYED_RELEASE_TABLET | Freq: Every day | ORAL | Status: DC

## 2015-02-07 NOTE — Patient Instructions (Addendum)
We have sent the following medications to your pharmacy for you to pick up at your convenience: Pantoprazole and [place] for Gastroesophageal Reflux Disease When you have gastroesophageal reflux disease (GERD), the foods you eat and your eating habits are very important. Choosing the right foods can help ease the discomfort of GERD. WHAT GENERAL GUIDELINES DO I NEED TO FOLLOW?  Choose fruits, vegetables, whole grains, low-fat dairy products, and low-fat meat, fish, and poultry.  Limit fats such as oils, salad dressings, butter, nuts, and avocado.  Keep a food diary to identify foods that cause symptoms.  Avoid foods that cause reflux. These may be different for different people.  Eat frequent small meals instead of three large meals each day.  Eat your meals slowly, in a relaxed setting.  Limit fried foods.  Cook foods using methods other than frying.  Avoid drinking alcohol.  Avoid drinking large amounts of liquids with your meals.  Avoid bending over or lying down until 2-3 hours after eating. WHAT FOODS ARE NOT RECOMMENDED? The following are some foods and drinks that may worsen your symptoms: Vegetables Tomatoes. Tomato juice. Tomato and spaghetti sauce. Chili peppers. Onion and garlic. Horseradish. Fruits Oranges, grapefruit, and lemon (fruit and juice). Meats High-fat meats, fish, and poultry. This includes hot dogs, ribs, ham, sausage, salami, and bacon. Dairy Whole milk and chocolate milk. Sour cream. Cream. Butter. Ice cream. Cream cheese.  Beverages Coffee and tea, with or without caffeine. Carbonated beverages or energy drinks. Condiments Hot sauce. Barbecue sauce.  Sweets/Desserts Chocolate and cocoa. Donuts. Peppermint and spearmint. Fats and Oils High-fat foods, including [place] fries and potato chips. Other Vinegar. Strong spices, such as black pepper, white pepper, red pepper, cayenne, curry powder, cloves, ginger, and chili powder. The  items listed above may not be a complete list of foods and beverages to avoid. Contact your dietitian for more information. Document Released: 10/11/2005 Document Revised: 10/16/2013 Document Reviewed: 08/15/2013 [facility] Patient Information 2015 [place], [place]. This information is not intended to replace advice given to you by your health care provider. Make sure you discuss any questions you have with your health care provider.   You have been scheduled for a Barium Esophogram at [facility] (1st floor of the hospital) on 02-11-15 at 9:30am. Please arrive 15 minutes prior to your appointment for registration. Make certain not to have anything to eat or drink 6 hours prior to your test. If you need to reschedule for any reason, please contact radiology at [phone] to do so. __________________________________________________________________ A barium swallow is an examination that concentrates on views of the esophagus. This tends to be a double contrast exam (barium and two liquids which, when combined, create a gas to distend the wall of the oesophagus) or single contrast (non-ionic iodine based). The study is usually tailored to your symptoms so a good history is essential. Attention is paid during the study to the form, structure and configuration of the esophagus, looking for functional disorders (such as aspiration, dysphagia, achalasia, motility and reflux) EXAMINATION You may be asked to change into a gown, depending on the type of swallow being performed. A radiologist and radiographer will perform the procedure. The radiologist will advise you of the type of contrast selected for your procedure and direct you during the exam. You will be asked to stand, sit or lie in several different positions and to hold a small amount of fluid in your mouth before being asked to swallow while the imaging is  performed .In some instances you may be asked to swallow barium coated marshmallows  to assess the motility of a solid food bolus. The exam can be recorded as a digital or video fluoroscopy procedure. POST PROCEDURE It will take 1-2 days for the barium to pass through your system. To facilitate this, it is important, unless otherwise directed, to increase your fluids for the next 24-48hrs and to resume your normal diet.  This test typically takes about 30 minutes to perform. __________________________________________________________________________________

## 2015-02-07 NOTE — Progress Notes (Signed)
Patient ID: [name], female   DOB: Apr 09, 1941, 74 y.o.   MRN: [id number]     History of Present Illness: [name] is a delightful 74 year old female known to Dr. With GERD, dysphagia, and diverticular disease. Her medical problems include diabetes, hypertension, coronary artery disease status post coronary artery bypass grafting in February 2013, hypothyroidism, hyperlipidemia, and osteoarthritis. She has been followed by Dr. For gastroesophageal reflux disease complicated by erosive esophagitis with peptic strictures. She last had an EGD in March 2012 at which time she was noted to have mild esophagitis in the distal esophagus, and otherwise normal esophagus, normal stomach, and normal duodenum. She was instructed to adhere to an antireflux regimen and to continue her PPI.  She is here today because she has been having epigastric pain and heartburn for 3-4 weeks which has been worse over the past week. The epigastric area is uncomfortable on an empty stomach or after ingestion of food. She is nauseous but has not vomited. Her nausea is worse on an empty stomach. She has had no bright red blood per rectum or melena and she is moving her bowels on a daily basis. She has been experiencing dysphagia to solids and dry foods and last week had an incident where she had to spit up cornbread because it would not go down. He has no phlegm and does not have a nocturnal cough but she states she feels very hoarse for the past month. She was on Nexium in the past but discontinued it because she couldn't afford it. When she was last seen by Dr. Henrene Pastor  she was prescribed omeprazole but feels it didn't help so she discontinued it. She has also in on a course of movement which she stopped several weeks ago. She denies NSAID use.   Past Medical History  Diagnosis Date  . Diabetes mellitus   . Hypertension   . GERD (gastroesophageal reflux disease)   . Neuropathy   . PONV (postoperative nausea and vomiting)    . High cholesterol   . Diabetic neuropathy   . Angina   . Myocardial infarction 12/02/11    "they think I might have had a light heart attack this wk"  . Pneumonia   . Bronchitis   . Hypothyroidism   . History of stomach ulcers 1970's  . Arthritis     "in my hands"  . Depression   . S/P CABG (coronary artery bypass graft), 12/04/11 12/07/2011    LIMA to LAD, SVG to OM, SVG to RCA  . Acute renal failure, 01/03/12 01/03/2012  . Diverticulosis   . Hemorrhoids   . Esophageal stricture   . History of nuclear stress test 03/10/2007    dipyridamole; low risk scan   . Coronary artery disease     DR. HILTY IS PT'S CARDIOLOGIST  . Pain     RIGHT KNEE PAIN - TORN RIGHT MEDIAL MENISCUS    Past Surgical History  Procedure Laterality Date  . Fracture surgery      "put pins both side right ankle"  . Total knee arthroplasty  ~ 2006    left  . Joint replacement    . Back surgery  2006    "cyst growing near my spine"  . Tonsillectomy  1949  . Abdominal hysterectomy  1980's  . Dilation and curettage of uterus      "a couple times"  . Cesarean section  1977  . Cataract extraction w/ intraocular lens  implant, bilateral  ~ 2010  .  Cardiac catheterization  12/02/2011    mild LV dysfunction with mod hypocontractility of mid-distal anterolateral wall; CAD w/ostial tapering of L Main with 50% diffuse ostial narrowing of LAD, 99% eccentric focal prox LAD stenosis followed by 70% prox LAD stenosis after 1st diag, 20% mid LAD narrowing; 80% ostial-to-prox L Cfx stenosis & 40-50% irregularity of RCA (Dr. Corky Downs)  . Coronary artery bypass graft  12/04/2011    Procedure: CORONARY ARTERY BYPASS GRAFTING (CABG);  Surgeon: Tharon Aquas Adelene Idler, MD;  Location: [facility];  Service: Open Heart Surgery;  Laterality: N/A;  CABG x three,  using left internal mammary artery, and right leg greater saphenous vein harvested endoscopically  . Transthoracic echocardiogram  02/19/2013    EF 0000000, grade 1 diastolic dysfunction;  mildly thickend/calcified AV leaflets; mildly calcidied MV annulus; mild TR  . Knee arthroscopy with medial menisectomy Right 07/02/2014    Procedure: RIGHT KNEE ARTHROSCOPY WITH PARTIAL MEDIAL MENISTECTOMY, ABRASION CONDROPLASTYU OF PATELLA,ABRASION CONDROPLASTY OF MEDIAL FEMEROL CONDYL, MICROFRACTURE OF MEDIAL FEMEROL CONDYL;  Surgeon: Tobi Bastos, MD;  Location: [facility];  Service: Orthopedics;  Laterality: Right;  . Left heart catheterization with coronary angiogram N/A 12/02/2011    Procedure: LEFT HEART CATHETERIZATION WITH CORONARY ANGIOGRAM;  Surgeon: Troy Sine, MD;  Location: [facility] C[facility];  Service: Cardiovascular;  Laterality: N/A;  Coronary angiogram, possible PCI   Family History  Problem Relation Age of Onset  . Colon cancer Neg Hx   . Breast cancer Sister     x 3  . Heart disease Father     also hyperlipidemia  . Heart disease Brother     x5; one with MI  . Heart disease Sister     x3  . Diabetes Mother     also, HTN & CVA  . Diabetes Sister     x3  . Lung cancer Sister   . Breast cancer Sister     x2   History  Substance Use Topics  . Smoking status: Never Smoker   . Smokeless tobacco: Never Used  . Alcohol Use: No   Current Outpatient Prescriptions  Medication Sig Dispense Refill  . amLODipine (NORVASC) 10 MG tablet Take 1 tablet (10 mg total) by mouth every morning. 30 tablet 6  . Cholecalciferol (VITAMIN D) 2000 UNITS tablet Take 2,000 Units by mouth daily.    . Cyanocobalamin (VITAMIN B 12 PO) Take 1 tablet by mouth daily.     . furosemide (LASIX) 20 MG tablet Take 1 tablet (20 mg total) by mouth daily. 30 tablet 7  . gabapentin (NEURONTIN) 300 MG capsule Take 300 mg by mouth 2 (two) times daily.     . insulin glargine (LANTUS) 100 UNIT/ML injection Inject 14 Units into the skin every morning.     Marland Kitchen levothyroxine (SYNTHROID, LEVOTHROID) 50 MCG tablet Take 50 mcg by mouth daily before breakfast.     . losartan (COZAAR) 50 MG tablet Take 50 mg by mouth  every morning.     Marland Kitchen MAGNESIUM PO Take 1 tablet by mouth daily.     . meclizine (ANTIVERT) 25 MG tablet Take 25 mg by mouth 4 (four) times daily as needed. For dizziness    . Multiple Vitamins-Minerals (PRESERVISION AREDS 2 PO) Take 1 tablet by mouth daily.    Vladimir Faster Glycol-Propyl Glycol (SYSTANE OP) Apply 1 drop to eye 2 (two) times daily.    . sitaGLIPtin-metformin (JANUMET) 50-1000 MG per tablet Take 1 tablet by mouth 2 (two) times daily  with a meal.    . vitamin C (ASCORBIC ACID) 500 MG tablet Take 500 mg by mouth daily.    . vitamin E 400 UNIT capsule Take 400 Units by mouth daily.    . pantoprazole (PROTONIX) 40 MG tablet Take 1 tablet (40 mg total) by mouth daily. 30 mins prior to breakfast 30 tablet 6  . sucralfate (CARAFATE) 1 G tablet Take 1 tablet (1 g total) by mouth 4 (four) times daily -  with meals and at bedtime. 120 tablet 0   No current facility-administered medications for this visit.   Allergies  Allergen Reactions  . Epinephrine Other (See Comments)    Abnormal feeling. Dental exam/injection of local w/ epi.  . Other     MANGO'S - WHELPS ALL OVER  . Sulfa Antibiotics       Review of Systems: Per history of present illness, otherwise negative.   ENDOSCOPIES: EGD-see history of present illness Colonoscopy November 2008 showed diverticulosis, internal and external hemorrhoids, no polyps seen. Patient was advised to have surveillance in 10 years.   Physical Exam: General: Pleasant, well developed female in no acute distress Head: Normocephalic and atraumatic Eyes:  sclerae anicteric, conjunctiva pink  Ears: Normal auditory acuity Lungs: Clear throughout to auscultation Heart: Regular rate and rhythm Abdomen: Soft, non distended, non-tender. No masses, no hepatomegaly. Normal bowel sounds Musculoskeletal: Symmetrical with no gross deformities  Extremities: No edema  Neurological: Alert oriented x 4, grossly nonfocal Psychological:  Alert and  cooperative. Normal mood and affect  Assessment and Recommendations:  74 year old female with a history of GERD who has not been compliant with an antireflux regimen and her PPI therapy, now with recurrent symptoms of epigastric pain, heartburn, and dysphagia. Reflux regimen has been reviewed at length. She will be given a trial of pantoprazole 40 mg by mouth every morning 30 minutes to prior to breakfast. She will be scheduled for a barium swallow with pill. Pending findings of the barium swallow she will likely be scheduled for an EGD with possible dilation. In the meantime she will also try Carafate 1 g one tablet before meals and at bedtime. Follow-up recommendations will be made pending the findings of the above.    Hvozdovic, Deloris Ping 02/07/2015,

## 2015-02-11 ENCOUNTER — Ambulatory Visit (HOSPITAL_COMMUNITY)
Admission: RE | Admit: 2015-02-11 | Discharge: 2015-02-11 | Disposition: A | Discharge status: Home / Self Care | Payer: Medicare Other | Source: Ambulatory Visit | Attending: Physician Assistant | Admitting: Physician Assistant

## 2015-02-11 DIAGNOSIS — K449 Diaphragmatic hernia without obstruction or gangrene: Secondary | ICD-10-CM | POA: Diagnosis not present

## 2015-02-11 DIAGNOSIS — K224 Dyskinesia of esophagus: Secondary | ICD-10-CM | POA: Diagnosis not present

## 2015-02-11 DIAGNOSIS — R1310 Dysphagia, unspecified: Secondary | ICD-10-CM | POA: Diagnosis present

## 2015-02-11 DIAGNOSIS — K219 Gastro-esophageal reflux disease without esophagitis: Secondary | ICD-10-CM

## 2015-02-11 NOTE — Progress Notes (Signed)
Agree with initial assessment and plans as outlined 

## 2015-03-14 ENCOUNTER — Ambulatory Visit (INDEPENDENT_AMBULATORY_CARE_PROVIDER_SITE_OTHER): Payer: Medicare Other | Admitting: Internal Medicine

## 2015-03-14 ENCOUNTER — Encounter: Payer: Self-pay | Admitting: Internal Medicine

## 2015-03-14 VITALS — BP 130/58 | HR 59 | Ht 60.0 in | Wt 139.2 lb

## 2015-03-14 DIAGNOSIS — I10 Essential (primary) hypertension: Secondary | ICD-10-CM

## 2015-03-14 DIAGNOSIS — I480 Paroxysmal atrial fibrillation: Secondary | ICD-10-CM

## 2015-03-14 DIAGNOSIS — R413 Other amnesia: Secondary | ICD-10-CM | POA: Diagnosis not present

## 2015-03-14 DIAGNOSIS — Z951 Presence of aortocoronary bypass graft: Secondary | ICD-10-CM

## 2015-03-14 DIAGNOSIS — E785 Hyperlipidemia, unspecified: Secondary | ICD-10-CM

## 2015-03-14 NOTE — Progress Notes (Signed)
OFFICE NOTE  Chief Complaint:  No complaints  Primary Care Physician: Haywood Pao, MD  HPI:  [name] is a pleasant 74 year old female with history of coronary disease and CABG x 3 vessels in 2013, LIMA to LAD, SVG to OM and SVG to right coronary. She underwent an echocardiogram which demonstrates normal EF of 55% to 65%. However, there is stage 1 diastolic dysfunction and evidence for elevated mean left atrial filling pressure. There were mildly thickened calcified aortic valve leaflets but no stenosis and mild tricuspid regurgitation. At the time, I started her on Lasix 20 mg daily. A BMP was obtained that day which indicated mild elevation at 75.5, creatinine was 1.16. She has been taking the Lasix with improvement in her lower extremity swelling although shortness of breath seems to be about the same. Again, she notes it is only with marked exertion, not necessarily with normal activities. However, she does have ongoing fatigue and easy fatigability. This may be due to just poor exercise tolerance. She also reported that recently you started her on a new SLGT2 diabetic medicine and she said that she took several doses of that, which I believe was Invokana, and she became markedly dizzy with that. She has since stopped that medication. Again, overall the patient is fairly stable with a normal ejection fraction. She recently had bypass and is not having any anginal symptoms and therefore I do not suspect that this is an issue related to her bypass grafts.   Her lower extremity swelling he has noted to resolve. She thinks that she is doing quite well at this time. She stopped taking her Lasix because she felt that she did not need it, but I requested that she restart 20 mg daily. She does have diastolic dysfunction but her weight has been fairly stable. She denies any chest pain or palpitations.  She also reports that she since been started on insulin by her primary care provider and  that her glucose levels are better than they have been in some time.    [name] returns today and is doing fairly well. She does report some chest tightness with very heavy activity however doing most activities fairly normal. Her blood pressure is notably elevated today 170/68. EKG does not show any ischemic changes.  She is not currently on cholesterol medication due to intolerance in the past. Finally, is notable that she's had regressive memory loss recently. There is a family history of Alzheimer's disease.  I saw [name] back in the office today. She was scheduled for a 3:15 afternoon appointment and showed up at 9:15. I asked her if she had followed up with her neurologist as I referred her because she's had problems with memory loss and there is a family history of Alzheimer's disease. She reports that she was called and there is documentation of this, but the documentation says that she was asked to call back when she was ready to schedule the appointment and she "forgot". She continues to have problems with remembering people's names particularly who she's gone to church with for a number of years. She denies any chest pain or shortness of breath today. She is not taking cholesterol medications because they're "too expensive".  PMHx:  Past Medical History  Diagnosis Date  . Diabetes mellitus   . Hypertension   . GERD (gastroesophageal reflux disease)   . Neuropathy   . PONV (postoperative nausea and vomiting)   . High cholesterol   . Diabetic  neuropathy   . Angina   . Myocardial infarction 12/02/11    "they think I might have had a light heart attack this wk"  . Pneumonia   . Bronchitis   . Hypothyroidism   . History of stomach ulcers 1970's  . Arthritis     "in my hands"  . Depression   . S/P CABG (coronary artery bypass graft), 12/04/11 12/07/2011    LIMA to LAD, SVG to OM, SVG to RCA  . Acute renal failure, 01/03/12 01/03/2012  . Diverticulosis   . Hemorrhoids   .  Esophageal stricture   . History of nuclear stress test 03/10/2007    dipyridamole; low risk scan   . Coronary artery disease     DR. HILTY IS PT'S CARDIOLOGIST  . Pain     RIGHT KNEE PAIN - TORN RIGHT MEDIAL MENISCUS    Past Surgical History  Procedure Laterality Date  . Fracture surgery      "put pins both side right ankle"  . Total knee arthroplasty  ~ 2006    left  . Joint replacement    . Back surgery  2006    "cyst growing near my spine"  . Tonsillectomy  1949  . Abdominal hysterectomy  1980's  . Dilation and curettage of uterus      "a couple times"  . Cesarean section  1977  . Cataract extraction w/ intraocular lens  implant, bilateral  ~ 2010  . Cardiac catheterization  12/02/2011    mild LV dysfunction with mod hypocontractility of mid-distal anterolateral wall; CAD w/ostial tapering of L Main with 50% diffuse ostial narrowing of LAD, 99% eccentric focal prox LAD stenosis followed by 70% prox LAD stenosis after 1st diag, 20% mid LAD narrowing; 80% ostial-to-prox L Cfx stenosis & 40-50% irregularity of RCA (Dr. Corky Downs)  . Coronary artery bypass graft  12/04/2011    Procedure: CORONARY ARTERY BYPASS GRAFTING (CABG);  Surgeon: Tharon Aquas Adelene Idler, MD;  Location: [facility];  Service: Open Heart Surgery;  Laterality: N/A;  CABG x three,  using left internal mammary artery, and right leg greater saphenous vein harvested endoscopically  . Transthoracic echocardiogram  02/19/2013    EF 0000000, grade 1 diastolic dysfunction; mildly thickend/calcified AV leaflets; mildly calcidied MV annulus; mild TR  . Knee arthroscopy with medial menisectomy Right 07/02/2014    Procedure: RIGHT KNEE ARTHROSCOPY WITH PARTIAL MEDIAL MENISTECTOMY, ABRASION CONDROPLASTYU OF PATELLA,ABRASION CONDROPLASTY OF MEDIAL FEMEROL CONDYL, MICROFRACTURE OF MEDIAL FEMEROL CONDYL;  Surgeon: Tobi Bastos, MD;  Location: [facility];  Service: Orthopedics;  Laterality: Right;  . Left heart catheterization with coronary angiogram  N/A 12/02/2011    Procedure: LEFT HEART CATHETERIZATION WITH CORONARY ANGIOGRAM;  Surgeon: Troy Sine, MD;  Location: [facility] C[facility];  Service: Cardiovascular;  Laterality: N/A;  Coronary angiogram, possible PCI    FAMHx:  Family History  Problem Relation Age of Onset  . Colon cancer Neg Hx   . Breast cancer Sister     x 3  . Heart disease Father     also hyperlipidemia  . Heart disease Brother     x5; one with MI  . Heart disease Sister     x3  . Diabetes Mother     also, HTN & CVA  . Diabetes Sister     x3  . Lung cancer Sister   . Breast cancer Sister     x2    SOCHx:   reports that she has never smoked. She  has never used smokeless tobacco. She reports that she does not drink alcohol or use illicit drugs.  ALLERGIES:  Allergies  Allergen Reactions  . Epinephrine Other (See Comments)    Abnormal feeling. Dental exam/injection of local w/ epi.  . Other     MANGO'S - WHELPS ALL OVER  . Sulfa Antibiotics     ROS: A comprehensive review of systems was negative except for: Constitutional: positive for fatigue Neurological: positive for memory problems  HOME MEDS: Current Outpatient Prescriptions  Medication Sig Dispense Refill  . amLODipine (NORVASC) 10 MG tablet Take 1 tablet (10 mg total) by mouth every morning. 30 tablet 6  . Cholecalciferol (VITAMIN D) 2000 UNITS tablet Take 2,000 Units by mouth daily.    . Cyanocobalamin (VITAMIN B 12 PO) Take 1 tablet by mouth daily.     . furosemide (LASIX) 20 MG tablet Take 1 tablet (20 mg total) by mouth daily. 30 tablet 7  . gabapentin (NEURONTIN) 300 MG capsule Take 300 mg by mouth 2 (two) times daily.     . insulin glargine (LANTUS) 100 UNIT/ML injection Inject 14 Units into the skin every morning.     Marland Kitchen levothyroxine (SYNTHROID, LEVOTHROID) 50 MCG tablet Take 50 mcg by mouth daily before breakfast.     . losartan (COZAAR) 50 MG tablet Take 50 mg by mouth every morning.     Marland Kitchen MAGNESIUM PO Take 1 tablet by mouth daily.       . meclizine (ANTIVERT) 25 MG tablet Take 25 mg by mouth 4 (four) times daily as needed. For dizziness    . Multiple Vitamins-Minerals (PRESERVISION AREDS 2 PO) Take 1 tablet by mouth daily.    . ONE TOUCH ULTRA TEST test strip daily.    . pantoprazole (PROTONIX) 40 MG tablet Take 1 tablet (40 mg total) by mouth daily. 30 mins prior to breakfast 30 tablet 6  . Polyethyl Glycol-Propyl Glycol (SYSTANE OP) Apply 1 drop to eye 2 (two) times daily.    . sitaGLIPtin-metformin (JANUMET) 50-1000 MG per tablet Take 1 tablet by mouth 2 (two) times daily with a meal.    . sucralfate (CARAFATE) 1 G tablet Take 1 tablet (1 g total) by mouth 4 (four) times daily -  with meals and at bedtime. 120 tablet 0  . vitamin C (ASCORBIC ACID) 500 MG tablet Take 500 mg by mouth daily.    . vitamin E 400 UNIT capsule Take 400 Units by mouth daily.     No current facility-administered medications for this visit.    LABS/IMAGING: No results found for this or any previous visit (from the past 48 hour(s)). No results found.  VITALS: BP 130/58 mmHg  Pulse 59  Ht 5' (1.524 m)  Wt 139 lb 3.2 oz (63.141 kg)  BMI 27.19 kg/m2  EXAM: General appearance: alert and no distress Neck: no adenopathy, no carotid bruit, no JVD, supple, symmetrical, trachea midline and thyroid not enlarged, symmetric, no tenderness/mass/nodules Lungs: clear to auscultation bilaterally Heart: regular rate and rhythm, S1, S2 normal, no murmur, click, rub or gallop Abdomen: soft, non-tender; bowel sounds normal; no masses,  no organomegaly Extremities: 1+ right lower extremity and 2+ left lower extremity edema, bilateral erythema worse on the left than the right, warmth and a large 2 cm hematoma on the left leg Pulses: 2+ and symmetric Skin: Bilateral erythema Neurologic: Grossly normal  EKG: Sinus bradycardia 59  ASSESSMENT: 1. Coronary artery disease status post CABG x 3 vessels in 2013 2. Diabetes type  2 - now on  insulin 3. Hypertension 4. Dyslipidemia 5. Peripheral neuropathy - secondary to diabetes 6. Chest pressure with marked exertion 7. Progressive memory loss  PLAN: 1.   [name] is doing well from a cardiac standpoint. She denies any worsening chest pain. Unfortunately she's not taking statin medications due to cost issues and problems with remembering to take the medicine. She has reported progressive memory loss. She forgot to schedule her neurology appointment therefore I would like to send her back to neurology for further evaluation. There is a family history of Alzheimer's disease. I've encouraged her to continue to work on some walking and exercise which will help with her energy level.  Plan to see her back in 6 months.  [name], MD, [facility] Attending Cardiologist [facility] 03/14/2015, 10:12 AM

## 2015-03-14 NOTE — Patient Instructions (Signed)
You have been referred to Dr. Carles Collet [facility]  Your physician wants you to follow-up in: 6 months with Dr. Debara Pickett. You will receive a reminder letter in the mail two months in advance. If you don't receive a letter, please call our office to schedule the follow-up appointment.

## 2015-03-21 ENCOUNTER — Telehealth: Payer: Self-pay | Admitting: Internal Medicine

## 2015-03-21 NOTE — Telephone Encounter (Signed)
Patient notified referral was placed and is "ready for initial scheduling"  Provided patient with phone number to Dr. Doristine Devoid office.

## 2015-03-21 NOTE — Telephone Encounter (Signed)
Pt said Dr Debara Pickett was going to refer her to a Neurologist. She have not heard anything. Is she suppose to call somebody?

## 2015-03-26 ENCOUNTER — Telehealth: Payer: Self-pay | Admitting: Internal Medicine

## 2015-03-26 DIAGNOSIS — R413 Other amnesia: Secondary | ICD-10-CM

## 2015-03-26 DIAGNOSIS — Z820 Family history of epilepsy and other diseases of the nervous system: Secondary | ICD-10-CM

## 2015-03-31 NOTE — Telephone Encounter (Signed)
[facility] - "department" was not listed in original referral thus they report it is not in their work que  Referral re-ordered.   Left message for patient with this information.

## 2015-03-31 NOTE — Telephone Encounter (Signed)
Pt said they still have not received the referral.

## 2015-04-06 ENCOUNTER — Other Ambulatory Visit: Payer: Self-pay | Admitting: Internal Medicine

## 2015-04-07 ENCOUNTER — Ambulatory Visit (INDEPENDENT_AMBULATORY_CARE_PROVIDER_SITE_OTHER): Payer: Medicare Other | Admitting: Neurology

## 2015-04-07 ENCOUNTER — Encounter: Payer: Self-pay | Admitting: Neurology

## 2015-04-07 VITALS — BP 138/86 | HR 68 | Ht 60.0 in | Wt 137.0 lb

## 2015-04-07 DIAGNOSIS — F330 Major depressive disorder, recurrent, mild: Secondary | ICD-10-CM | POA: Diagnosis not present

## 2015-04-07 DIAGNOSIS — E1342 Other specified diabetes mellitus with diabetic polyneuropathy: Secondary | ICD-10-CM

## 2015-04-07 DIAGNOSIS — F6811 Factitious disorder with predominantly psychological signs and symptoms: Secondary | ICD-10-CM

## 2015-04-07 DIAGNOSIS — E781 Pure hyperglyceridemia: Secondary | ICD-10-CM

## 2015-04-07 DIAGNOSIS — G629 Polyneuropathy, unspecified: Secondary | ICD-10-CM

## 2015-04-07 DIAGNOSIS — R4189 Other symptoms and signs involving cognitive functions and awareness: Secondary | ICD-10-CM

## 2015-04-07 DIAGNOSIS — R413 Other amnesia: Secondary | ICD-10-CM

## 2015-04-07 DIAGNOSIS — E1142 Type 2 diabetes mellitus with diabetic polyneuropathy: Secondary | ICD-10-CM

## 2015-04-07 DIAGNOSIS — E785 Hyperlipidemia, unspecified: Secondary | ICD-10-CM

## 2015-04-07 NOTE — Patient Instructions (Signed)
1. Start safe cardiovascular exercise that you enjoy.  2. Try to do daily mental exercises. Like crossword puzzles, etc.
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
## 2015-04-07 NOTE — Telephone Encounter (Signed)
refill 

## 2015-04-07 NOTE — Progress Notes (Signed)
Note routed to Dr Osborne Casco.

## 2015-05-16 ENCOUNTER — Ambulatory Visit: Payer: Medicare Other | Admitting: Internal Medicine

## 2015-06-02 ENCOUNTER — Telehealth: Payer: Self-pay | Admitting: Internal Medicine

## 2015-06-02 NOTE — Telephone Encounter (Signed)
[name] is calling because she is having some pain in her side that comes up thru her stomach to her chest ,and it is numb and tingly . This pain has been going on now for about 2-3 days . Please call   Thanks

## 2015-06-02 NOTE — Telephone Encounter (Signed)
Patient c/o stomach pain on her sides that comes up thru her chest. She reports his is all time the time and only relieved when she lies down. She reports the pain is numbness and tingling. She reports it is worse when she is sitting up. There is no correlation to when she eats or drinks. She reports it is does not feel like heartburn or reflux and she reports she has seen Dr. Melina Copa in [place] about this and had endoscopy and it was clear - she reports they were concerned about an ulcer. She reports she has seen quite a few doctors about this and no one has figured this out. Unclear if symptoms are cardiac related and patient would like to see Dr. Debara Pickett - scheduled for 8/9 @ 4pm

## 2015-06-03 ENCOUNTER — Ambulatory Visit (INDEPENDENT_AMBULATORY_CARE_PROVIDER_SITE_OTHER): Payer: Medicare Other | Admitting: Internal Medicine

## 2015-06-03 ENCOUNTER — Encounter: Payer: Self-pay | Admitting: Internal Medicine

## 2015-06-03 VITALS — BP 160/60 | HR 65 | Ht 60.0 in | Wt 136.0 lb

## 2015-06-03 DIAGNOSIS — R197 Diarrhea, unspecified: Secondary | ICD-10-CM

## 2015-06-03 DIAGNOSIS — R140 Abdominal distension (gaseous): Secondary | ICD-10-CM

## 2015-06-03 DIAGNOSIS — K5909 Other constipation: Secondary | ICD-10-CM

## 2015-06-03 DIAGNOSIS — I480 Paroxysmal atrial fibrillation: Secondary | ICD-10-CM

## 2015-06-03 DIAGNOSIS — R109 Unspecified abdominal pain: Secondary | ICD-10-CM | POA: Diagnosis not present

## 2015-06-03 DIAGNOSIS — K5900 Constipation, unspecified: Secondary | ICD-10-CM

## 2015-06-03 DIAGNOSIS — Z79899 Other long term (current) drug therapy: Secondary | ICD-10-CM

## 2015-06-03 DIAGNOSIS — R413 Other amnesia: Secondary | ICD-10-CM

## 2015-06-03 DIAGNOSIS — R1013 Epigastric pain: Secondary | ICD-10-CM

## 2015-06-03 LAB — BUN: BUN: 23 mg/dL (ref 7–25)

## 2015-06-03 LAB — CREATININE, SERUM: Creat: 1.5 mg/dL — ABNORMAL HIGH (ref 0.60–0.93)

## 2015-06-03 NOTE — Progress Notes (Signed)
OFFICE NOTE  Chief Complaint:  Abdominal pain  Primary Care Physician: Haywood Pao, MD  HPI:  [name] is a pleasant 74 year old female with history of coronary disease and CABG x 3 vessels in 2013, LIMA to LAD, SVG to OM and SVG to right coronary. She underwent an echocardiogram which demonstrates normal EF of 55% to 65%. However, there is stage 1 diastolic dysfunction and evidence for elevated mean left atrial filling pressure. There were mildly thickened calcified aortic valve leaflets but no stenosis and mild tricuspid regurgitation. At the time, I started her on Lasix 20 mg daily. A BMP was obtained that day which indicated mild elevation at 75.5, creatinine was 1.16. She has been taking the Lasix with improvement in her lower extremity swelling although shortness of breath seems to be about the same. Again, she notes it is only with marked exertion, not necessarily with normal activities. However, she does have ongoing fatigue and easy fatigability. This may be due to just poor exercise tolerance. She also reported that recently you started her on a new SLGT2 diabetic medicine and she said that she took several doses of that, which I believe was Invokana, and she became markedly dizzy with that. She has since stopped that medication. Again, overall the patient is fairly stable with a normal ejection fraction. She recently had bypass and is not having any anginal symptoms and therefore I do not suspect that this is an issue related to her bypass grafts.   I saw [name] today in the office for an acute visit regarding what is described as abdominal pain. She reports a numbness and pain sensation over the right upper quadrant. She's also noted some firmness and distention of her abdomen. This is recently presented to a GI doctor in [place] who thought she might have a gastric ulcer. He performed endoscopy which was a reportedly unrevealing. He also recommended she is take MiraLAX  for constipation. She says that recently she's been constipated but also has been having some small amount of loose stool. She did not want to take the MiraLAX given the loose stool. She also reports some of the discomfort extends across the mid epigastrium and into the lower chest. It is entirely atypical for cardiac chest pain. She reports that she had a colonoscopy in the past by Dr. Henrene Pastor about 5 years ago and a repeat colonoscopy was recommended around 2018. She also tells me that she recently had a right upper quadrant ultrasound performed at [facility] after presenting with abdominal pain which was negative for acute biliary disease.  PMHx:  Past Medical History  Diagnosis Date  . Diabetes mellitus   . Hypertension   . GERD (gastroesophageal reflux disease)   . Neuropathy   . PONV (postoperative nausea and vomiting)   . High cholesterol   . Diabetic neuropathy   . Angina   . Myocardial infarction 12/02/11    "they think I might have had a light heart attack this wk"  . Pneumonia   . Bronchitis   . Hypothyroidism   . History of stomach ulcers 1970's  . Arthritis     "in my hands"  . Depression   . S/P CABG (coronary artery bypass graft), 12/04/11 12/07/2011    LIMA to LAD, SVG to OM, SVG to RCA  . Acute renal failure, 01/03/12 01/03/2012  . Diverticulosis   . Hemorrhoids   . Esophageal stricture   . History of nuclear stress test 03/10/2007  dipyridamole; low risk scan   . Coronary artery disease     DR. HILTY IS PT'S CARDIOLOGIST  . Pain     RIGHT KNEE PAIN - TORN RIGHT MEDIAL MENISCUS  . Hiatal hernia     Past Surgical History  Procedure Laterality Date  . Fracture surgery      "put pins both side right ankle"  . Total knee arthroplasty  ~ 2006    left  . Joint replacement    . Back surgery  2006    "cyst growing near my spine"  . Tonsillectomy  1949  . Abdominal hysterectomy  1980's  . Dilation and curettage of uterus      "a couple times"  .  Cesarean section  1977  . Cataract extraction w/ intraocular lens  implant, bilateral  ~ 2010  . Cardiac catheterization  12/02/2011    mild LV dysfunction with mod hypocontractility of mid-distal anterolateral wall; CAD w/ostial tapering of L Main with 50% diffuse ostial narrowing of LAD, 99% eccentric focal prox LAD stenosis followed by 70% prox LAD stenosis after 1st diag, 20% mid LAD narrowing; 80% ostial-to-prox L Cfx stenosis & 40-50% irregularity of RCA (Dr. Corky Downs)  . Coronary artery bypass graft  12/04/2011    Procedure: CORONARY ARTERY BYPASS GRAFTING (CABG);  Surgeon: Tharon Aquas Adelene Idler, MD;  Location: [place];  Service: Open Heart Surgery;  Laterality: N/A;  CABG x three,  using left internal mammary artery, and right leg greater saphenous vein harvested endoscopically  . Transthoracic echocardiogram  02/19/2013    EF 0000000, grade 1 diastolic dysfunction; mildly thickend/calcified AV leaflets; mildly calcidied MV annulus; mild TR  . Knee arthroscopy with medial menisectomy Right 07/02/2014    Procedure: RIGHT KNEE ARTHROSCOPY WITH PARTIAL MEDIAL MENISTECTOMY, ABRASION CONDROPLASTYU OF PATELLA,ABRASION CONDROPLASTY OF MEDIAL FEMEROL CONDYL, MICROFRACTURE OF MEDIAL FEMEROL CONDYL;  Surgeon: Tobi Bastos, MD;  Location: [facility];  Service: Orthopedics;  Laterality: Right;  . Left heart catheterization with coronary angiogram N/A 12/02/2011    Procedure: LEFT HEART CATHETERIZATION WITH CORONARY ANGIOGRAM;  Surgeon: Troy Sine, MD;  Location: [facility] C[facility] LAB;  Service: Cardiovascular;  Laterality: N/A;  Coronary angiogram, possible PCI    FAMHx:  Family History  Problem Relation Age of Onset  . Colon cancer Neg Hx   . Breast cancer Sister     x 3  . Heart disease Father     also hyperlipidemia  . Heart disease Brother     x5; one with MI  . Heart disease Sister     x3  . Diabetes Mother     also, HTN & CVA  . Diabetes Sister     x3  . Lung cancer Sister   . Breast cancer Sister      x2    SOCHx:   reports that she has never smoked. She has never used smokeless tobacco. She reports that she drinks alcohol. She reports that she does not use illicit drugs.  ALLERGIES:  Allergies  Allergen Reactions  . Epinephrine Other (See Comments)    Abnormal feeling. Dental exam/injection of local w/ epi.  . Other     MANGO'S - WHELPS ALL OVER  . Sulfa Antibiotics     ROS: A comprehensive review of systems was negative except for: Gastrointestinal: positive for abdominal pain, change in bowel habits, constipation and diarrhea Neurological: positive for memory problems  HOME MEDS: Current Outpatient Prescriptions  Medication Sig Dispense Refill  . amLODipine (NORVASC) 10  MG tablet TAKE ONE TABLET BY MOUTH IN THE MORNING 30 tablet 6  . Cholecalciferol (VITAMIN D) 2000 UNITS tablet Take 2,000 Units by mouth daily.    . Cyanocobalamin (VITAMIN B 12 PO) Take 1 tablet by mouth daily.     . furosemide (LASIX) 20 MG tablet Take 1 tablet (20 mg total) by mouth daily. 30 tablet 7  . gabapentin (NEURONTIN) 300 MG capsule Take 300 mg by mouth 2 (two) times daily.     . insulin glargine (LANTUS) 100 UNIT/ML injection Inject 14 Units into the skin every morning.     Marland Kitchen levothyroxine (SYNTHROID, LEVOTHROID) 50 MCG tablet Take 50 mcg by mouth daily before breakfast.     . losartan (COZAAR) 50 MG tablet Take 50 mg by mouth every morning.     Marland Kitchen MAGNESIUM PO Take 1 tablet by mouth daily.     . meclizine (ANTIVERT) 25 MG tablet Take 25 mg by mouth 4 (four) times daily as needed. For dizziness    . Multiple Vitamins-Minerals (PRESERVISION AREDS 2 PO) Take 1 tablet by mouth daily.    . ONE TOUCH ULTRA TEST test strip daily.    . pantoprazole (PROTONIX) 40 MG tablet Take 1 tablet (40 mg total) by mouth daily. 30 mins prior to breakfast 30 tablet 6  . Polyethyl Glycol-Propyl Glycol (SYSTANE OP) Apply 1 drop to eye 2 (two) times daily.    . predniSONE (STERAPRED UNI-PAK 21 TAB) 5 MG (21) TBPK tablet  Take 5 mg by mouth 2 (two) times daily.     . sitaGLIPtin-metformin (JANUMET) 50-1000 MG per tablet Take 1 tablet by mouth 2 (two) times daily with a meal.    . sucralfate (CARAFATE) 1 G tablet Take 1 tablet (1 g total) by mouth 4 (four) times daily -  with meals and at bedtime. 120 tablet 0  . vitamin C (ASCORBIC ACID) 500 MG tablet Take 500 mg by mouth daily.    . vitamin E 400 UNIT capsule Take 400 Units by mouth daily.     No current facility-administered medications for this visit.    LABS/IMAGING: No results found for this or any previous visit (from the past 48 hour(s)). No results found.  VITALS: BP 160/60 mmHg  Pulse 65  Ht 5' (1.524 m)  Wt 136 lb (61.689 kg)  BMI 26.56 kg/m2  EXAM: General appearance: alert and no distress Neck: no carotid bruit and no JVD Lungs: clear to auscultation bilaterally Heart: regular rate and rhythm, S1, S2 normal, no murmur, click, rub or gallop Abdomen: Protuberant, distended with mild to be need to percussion, no rebound and mild guarding in the right upper quadrant, negative Murphy sign Extremities: extremities normal, atraumatic, no cyanosis or edema Pulses: 2+ and symmetric Skin: Bilateral erythema Neurologic: Grossly normal  EKG: Normal sinus rhythm at 65  ASSESSMENT: 1. Abdominal pain 2. Coronary artery disease status post CABG x 3 vessels in 2013 3. Diabetes type 2 - now on insulin 4. Hypertension 5. Dyslipidemia 6. Peripheral neuropathy - secondary to diabetes 7. Progressive memory loss  PLAN: 1.   [name] presents today for an acute visit for what appears to be abdominal pain. This is a chronic problem and more specifically she's having right upper quadrant pain. There is distention of the abdomen on exam with tympany to percussion, suggesting abdominal distention from gas and probable obstipation/constipation. She did have a colonoscopy per her report 5 years ago which did not show mass however this cannot be excluded.  Recent endoscopy per her report was unrevealing. I agree with her GI doctor in that she should take MiraLAX to try to help with constipation and that her recent small amount of loose stool likely represents overflow incontinence. I will order CT scan of her abdomen to rule out any external masses. I recommend she follow-up with her primary care provider and gastroenterologist.  [name], MD, [facility] Attending Cardiologist [place] 06/03/2015, 6:38 PM

## 2015-06-03 NOTE — Patient Instructions (Signed)
Dr. Debara Pickett has ordered a CT abdomen with and without contrast to be done at [facility] ([address]) You will need to have lab work done before this test to assess your kidney function   Dr. Debara Pickett recommends that you take miralax  Dr. Debara Pickett recommends that you follow up in November

## 2015-06-06 ENCOUNTER — Ambulatory Visit
Admission: RE | Admit: 2015-06-06 | Discharge: 2015-06-06 | Disposition: A | Discharge status: Home / Self Care | Payer: Medicare Other | Source: Ambulatory Visit | Attending: Internal Medicine | Admitting: Internal Medicine

## 2015-06-06 ENCOUNTER — Telehealth: Payer: Self-pay | Admitting: Internal Medicine

## 2015-06-06 ENCOUNTER — Inpatient Hospital Stay: Admission: RE | Admit: 2015-06-06 | Payer: Medicare Other | Source: Ambulatory Visit

## 2015-06-06 DIAGNOSIS — R109 Unspecified abdominal pain: Secondary | ICD-10-CM

## 2015-06-06 NOTE — Telephone Encounter (Signed)
[name] is calling because they can't perform the procedure today due to elevated labs . Marland Kitchen

## 2015-06-06 NOTE — Telephone Encounter (Signed)
Alice from [facility] called and said they could not do patients CT with IV contrast because her Creat of 3 days ago is 1.50 and her last recorded GFR is 41.  Dr. Debara Pickett said to do CT without IV contrast.  Alice informed ( [id number]) [phone]

## 2015-06-27 ENCOUNTER — Other Ambulatory Visit: Payer: Self-pay

## 2015-06-27 DIAGNOSIS — G629 Polyneuropathy, unspecified: Secondary | ICD-10-CM

## 2015-06-27 DIAGNOSIS — Q8500 Neurofibromatosis, unspecified: Secondary | ICD-10-CM

## 2015-06-27 DIAGNOSIS — G63 Polyneuropathy in diseases classified elsewhere: Secondary | ICD-10-CM

## 2015-07-01 ENCOUNTER — Other Ambulatory Visit: Payer: Self-pay

## 2015-07-01 DIAGNOSIS — G63 Polyneuropathy in diseases classified elsewhere: Secondary | ICD-10-CM

## 2015-07-01 DIAGNOSIS — Q8500 Neurofibromatosis, unspecified: Secondary | ICD-10-CM

## 2015-07-01 DIAGNOSIS — G629 Polyneuropathy, unspecified: Secondary | ICD-10-CM

## 2015-07-02 ENCOUNTER — Ambulatory Visit
Admission: RE | Admit: 2015-07-02 | Discharge: 2015-07-02 | Disposition: A | Discharge status: Home / Self Care | Payer: Medicare Other | Source: Ambulatory Visit | Attending: Surgery | Admitting: Surgery

## 2015-07-18 ENCOUNTER — Encounter: Payer: Self-pay | Admitting: Internal Medicine

## 2015-09-03 ENCOUNTER — Encounter: Payer: Self-pay | Admitting: Internal Medicine

## 2015-09-08 ENCOUNTER — Encounter: Payer: Self-pay | Admitting: Internal Medicine

## 2015-09-08 ENCOUNTER — Ambulatory Visit (INDEPENDENT_AMBULATORY_CARE_PROVIDER_SITE_OTHER): Payer: Medicare Other | Admitting: Internal Medicine

## 2015-09-08 VITALS — BP 140/58 | HR 61 | Ht 60.0 in | Wt 139.1 lb

## 2015-09-08 DIAGNOSIS — I480 Paroxysmal atrial fibrillation: Secondary | ICD-10-CM | POA: Diagnosis not present

## 2015-09-08 DIAGNOSIS — E1042 Type 1 diabetes mellitus with diabetic polyneuropathy: Secondary | ICD-10-CM | POA: Diagnosis not present

## 2015-09-08 DIAGNOSIS — R413 Other amnesia: Secondary | ICD-10-CM | POA: Diagnosis not present

## 2015-09-08 DIAGNOSIS — I10 Essential (primary) hypertension: Secondary | ICD-10-CM

## 2015-09-08 DIAGNOSIS — Z951 Presence of aortocoronary bypass graft: Secondary | ICD-10-CM | POA: Diagnosis not present

## 2015-09-08 DIAGNOSIS — E785 Hyperlipidemia, unspecified: Secondary | ICD-10-CM

## 2015-09-08 NOTE — Patient Instructions (Signed)
Your physician wants you to follow-up in: 6 months with Dr. Debara Pickett. You will receive a reminder letter in the mail two months in advance. If you don't receive a letter, please call our office to schedule the follow-up appointment.  Please call with the of your new primary care doctor.

## 2015-09-08 NOTE — Progress Notes (Signed)
OFFICE NOTE  Chief Complaint:  Forgetful  Primary Care Physician: Haywood Pao, MD  HPI:  [name] is a pleasant 74 year old female with history of coronary disease and CABG x 3 vessels in 2013, LIMA to LAD, SVG to OM and SVG to right coronary. She underwent an echocardiogram which demonstrates normal EF of 55% to 65%. However, there is stage 1 diastolic dysfunction and evidence for elevated mean left atrial filling pressure. There were mildly thickened calcified aortic valve leaflets but no stenosis and mild tricuspid regurgitation. At the time, I started her on Lasix 20 mg daily. A BMP was obtained that day which indicated mild elevation at 75.5, creatinine was 1.16. She has been taking the Lasix with improvement in her lower extremity swelling although shortness of breath seems to be about the same. Again, she notes it is only with marked exertion, not necessarily with normal activities. However, she does have ongoing fatigue and easy fatigability. This may be due to just poor exercise tolerance. She also reported that recently you started her on a new SLGT2 diabetic medicine and she said that she took several doses of that, which I believe was Invokana, and she became markedly dizzy with that. She has since stopped that medication. Again, overall the patient is fairly stable with a normal ejection fraction. She recently had bypass and is not having any anginal symptoms and therefore I do not suspect that this is an issue related to her bypass grafts.   I saw [name] today in the office for an acute visit regarding what is described as abdominal pain. She reports a numbness and pain sensation over the right upper quadrant. She's also noted some firmness and distention of her abdomen. This is recently presented to a GI doctor in [place] who thought she might have a gastric ulcer. He performed endoscopy which was a reportedly unrevealing. He also recommended she is take MiraLAX for  constipation. She says that recently she's been constipated but also has been having some small amount of loose stool. She did not want to take the MiraLAX given the loose stool. She also reports some of the discomfort extends across the mid epigastrium and into the lower chest. It is entirely atypical for cardiac chest pain. She reports that she had a colonoscopy in the past by Dr. Henrene Pastor about 5 years ago and a repeat colonoscopy was recommended around 2018. She also tells me that she recently had a right upper quadrant ultrasound performed at [facility] after presenting with abdominal pain which was negative for acute biliary disease.  [name] returns today for follow-up. Overall she seems to be doing well from a cardiac standpoint. I understand she is switched primary care providers to [facility] primary care, however she is unclear of her primary care provider. She does have an upcoming appointment to see Jackelyn Knife neurology. Recently she's been having trouble with forgetfulness and it seems to be getting worse. She tells me she lost her cell phone at home and has not been able to locate it. She denies any chest pain or shortness of breath and in fact her flank back and abdominal pain which was evaluated extensively has now gone away.  PMHx:  Past Medical History  Diagnosis Date  . Diabetes mellitus   . Hypertension   . GERD (gastroesophageal reflux disease)   . Neuropathy ([place])   . PONV (postoperative nausea and vomiting)   . High cholesterol   . Diabetic  neuropathy ([place])   . Angina   . Myocardial infarction ([place]) 12/02/11    "they think I might have had a light heart attack this wk"  . Pneumonia   . Bronchitis   . Hypothyroidism   . History of stomach ulcers 1970's  . Arthritis     "in my hands"  . Depression   . S/P CABG (coronary artery bypass graft), 12/04/11 12/07/2011    LIMA to LAD, SVG to OM, SVG to RCA  . Acute renal failure, 01/03/12 01/03/2012  .  Diverticulosis   . Hemorrhoids   . Esophageal stricture   . History of nuclear stress test 03/10/2007    dipyridamole; low risk scan   . Coronary artery disease     DR. HILTY IS PT'S CARDIOLOGIST  . Pain     RIGHT KNEE PAIN - TORN RIGHT MEDIAL MENISCUS  . Hiatal hernia     Past Surgical History  Procedure Laterality Date  . Fracture surgery      "put pins both side right ankle"  . Total knee arthroplasty  ~ 2006    left  . Joint replacement    . Back surgery  2006    "cyst growing near my spine"  . Tonsillectomy  1949  . Abdominal hysterectomy  1980's  . Dilation and curettage of uterus      "a couple times"  . Cesarean section  1977  . Cataract extraction w/ intraocular lens  implant, bilateral  ~ 2010  . Cardiac catheterization  12/02/2011    mild LV dysfunction with mod hypocontractility of mid-distal anterolateral wall; CAD w/ostial tapering of L Main with 50% diffuse ostial narrowing of LAD, 99% eccentric focal prox LAD stenosis followed by 70% prox LAD stenosis after 1st diag, 20% mid LAD narrowing; 80% ostial-to-prox L Cfx stenosis & 40-50% irregularity of RCA (Dr. Corky Downs)  . Coronary artery bypass graft  12/04/2011    Procedure: CORONARY ARTERY BYPASS GRAFTING (CABG);  Surgeon: Tharon Aquas Adelene Idler, MD;  Location: [place];  Service: Open Heart Surgery;  Laterality: N/A;  CABG x three,  using left internal mammary artery, and right leg greater saphenous vein harvested endoscopically  . Transthoracic echocardiogram  02/19/2013    EF 0000000, grade 1 diastolic dysfunction; mildly thickend/calcified AV leaflets; mildly calcidied MV annulus; mild TR  . Knee arthroscopy with medial menisectomy Right 07/02/2014    Procedure: RIGHT KNEE ARTHROSCOPY WITH PARTIAL MEDIAL MENISTECTOMY, ABRASION CONDROPLASTYU OF PATELLA,ABRASION CONDROPLASTY OF MEDIAL FEMEROL CONDYL, MICROFRACTURE OF MEDIAL FEMEROL CONDYL;  Surgeon: Tobi Bastos, MD;  Location: [facility];  Service: Orthopedics;  Laterality:  Right;  . Left heart catheterization with coronary angiogram N/A 12/02/2011    Procedure: LEFT HEART CATHETERIZATION WITH CORONARY ANGIOGRAM;  Surgeon: Troy Sine, MD;  Location: [facility] CATH LAB;  Service: Cardiovascular;  Laterality: N/A;  Coronary angiogram, possible PCI    FAMHx:  Family History  Problem Relation Age of Onset  . Colon cancer Neg Hx   . Breast cancer Sister     x 3  . Heart disease Father     also hyperlipidemia  . Heart disease Brother     x5; one with MI  . Heart disease Sister     x3  . Diabetes Mother     also, HTN & CVA  . Diabetes Sister     x3  . Lung cancer Sister   . Breast cancer Sister     x2    SOCHx:  reports that she has never smoked. She has never used smokeless tobacco. She reports that she drinks alcohol. She reports that she does not use illicit drugs.  ALLERGIES:  Allergies  Allergen Reactions  . Epinephrine Other (See Comments)    Abnormal feeling. Dental exam/injection of local w/ epi.  . Other     MANGO'S - WHELPS ALL OVER  . Sulfa Antibiotics     ROS: A comprehensive review of systems was negative except for: Neurological: positive for memory problems  HOME MEDS: Current Outpatient Prescriptions  Medication Sig Dispense Refill  . amLODipine (NORVASC) 10 MG tablet TAKE ONE TABLET BY MOUTH IN THE MORNING 30 tablet 6  . Cholecalciferol (VITAMIN D) 2000 UNITS tablet Take 2,000 Units by mouth daily.    . Cyanocobalamin (VITAMIN B 12 PO) Take 1 tablet by mouth daily.     . furosemide (LASIX) 20 MG tablet Take 1 tablet (20 mg total) by mouth daily. 30 tablet 7  . gabapentin (NEURONTIN) 300 MG capsule Take 300 mg by mouth 2 (two) times daily.     . insulin glargine (LANTUS) 100 UNIT/ML injection Inject 14 Units into the skin every morning.     Marland Kitchen levothyroxine (SYNTHROID, LEVOTHROID) 50 MCG tablet Take 50 mcg by mouth daily before breakfast.     . losartan (COZAAR) 50 MG tablet Take 50 mg by mouth every morning.     Marland Kitchen MAGNESIUM PO  Take 1 tablet by mouth daily.     . meclizine (ANTIVERT) 25 MG tablet Take 25 mg by mouth 4 (four) times daily as needed. For dizziness    . Multiple Vitamins-Minerals (PRESERVISION AREDS 2 PO) Take 1 tablet by mouth daily.    . ONE TOUCH ULTRA TEST test strip daily.    . pantoprazole (PROTONIX) 40 MG tablet Take 1 tablet (40 mg total) by mouth daily. 30 mins prior to breakfast 30 tablet 6  . Polyethyl Glycol-Propyl Glycol (SYSTANE OP) Apply 1 drop to eye 2 (two) times daily.    . predniSONE (STERAPRED UNI-PAK 21 TAB) 5 MG (21) TBPK tablet Take 5 mg by mouth 2 (two) times daily.     . sitaGLIPtin-metformin (JANUMET) 50-1000 MG per tablet Take 1 tablet by mouth 2 (two) times daily with a meal.    . sucralfate (CARAFATE) 1 G tablet Take 1 tablet (1 g total) by mouth 4 (four) times daily -  with meals and at bedtime. 120 tablet 0  . vitamin C (ASCORBIC ACID) 500 MG tablet Take 500 mg by mouth daily.    . vitamin E 400 UNIT capsule Take 400 Units by mouth daily.     No current facility-administered medications for this visit.    LABS/IMAGING: No results found for this or any previous visit (from the past 48 hour(s)). No results found.  VITALS: BP 140/58 mmHg  Pulse 61  Ht 5' (1.524 m)  Wt 139 lb 2 oz (63.107 kg)  BMI 27.17 kg/m2  EXAM: General appearance: alert and no distress Neck: no carotid bruit and no JVD Lungs: clear to auscultation bilaterally Heart: regular rate and rhythm, S1, S2 normal, no murmur, click, rub or gallop Abdomen: Protuberant, distended with mild to be need to percussion, no rebound and mild guarding in the right upper quadrant, negative Murphy sign Extremities: extremities normal, atraumatic, no cyanosis or edema Pulses: 2+ and symmetric Skin: Bilateral erythema Neurologic: Grossly normal  EKG: Normal sinus rhythm at 61  ASSESSMENT: 1. Coronary artery disease status post CABG x 3  vessels in 2013 2. Diabetes type 2 - now on  insulin 3. Hypertension 4. Dyslipidemia 5. Peripheral neuropathy - secondary to diabetes 6. Progressive memory loss  PLAN: 1.   [name] is stable from a cardiac standpoint. She seems to be mostly suffering with significant memory problems which is frustrating for her. She "doesn't want to have dementia" like some of her family members. She does have an upcoming appointment with her neurologist - she was previously diagnosed with pseudodementia. Blood pressure is well-controlled. She's having no anginal symptoms. I would recommend that you take daily low-dose aspirin 81 mg daily. It was unclear whether she is taking this. Plan to continue current medications and we'll see her back annually or sooner as necessary.  [name], MD, [facility] Attending Cardiologist [facility]  Nadean Corwin Hilty 09/08/2015, 1:25 PM

## 2015-10-07 ENCOUNTER — Ambulatory Visit: Payer: Medicare Other | Admitting: Neurology

## 2015-10-09 ENCOUNTER — Other Ambulatory Visit: Payer: Self-pay | Admitting: Internal Medicine

## 2015-10-10 NOTE — Telephone Encounter (Signed)
Rx(s) sent to pharmacy electronically.  

## 2015-10-28 ENCOUNTER — Encounter: Payer: Self-pay | Admitting: Neurology

## 2015-10-28 ENCOUNTER — Ambulatory Visit (INDEPENDENT_AMBULATORY_CARE_PROVIDER_SITE_OTHER): Payer: Medicare Other | Admitting: Neurology

## 2015-10-28 VITALS — BP 112/60 | HR 62 | Ht 60.0 in | Wt 141.0 lb

## 2015-10-28 DIAGNOSIS — R413 Other amnesia: Secondary | ICD-10-CM

## 2015-10-28 DIAGNOSIS — E1142 Type 2 diabetes mellitus with diabetic polyneuropathy: Secondary | ICD-10-CM

## 2015-10-28 DIAGNOSIS — R0989 Other specified symptoms and signs involving the circulatory and respiratory systems: Secondary | ICD-10-CM

## 2015-10-28 DIAGNOSIS — F331 Major depressive disorder, recurrent, moderate: Secondary | ICD-10-CM | POA: Diagnosis not present
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
## 2015-10-28 NOTE — Patient Instructions (Signed)
1. Follow up in 6 months. 2. Your will receive a call from Dr. Monico Hoar office to schedule an appointment with you for Neurocognitive Testing.

## 2015-10-30 ENCOUNTER — Ambulatory Visit (HOSPITAL_COMMUNITY)
Admission: RE | Admit: 2015-10-30 | Discharge: 2015-10-30 | Disposition: A | Discharge status: Home / Self Care | Payer: Medicare Other | Source: Ambulatory Visit | Attending: Cardiology | Admitting: Cardiology

## 2015-10-30 DIAGNOSIS — R0989 Other specified symptoms and signs involving the circulatory and respiratory systems: Secondary | ICD-10-CM | POA: Insufficient documentation

## 2015-10-30 DIAGNOSIS — E1140 Type 2 diabetes mellitus with diabetic neuropathy, unspecified: Secondary | ICD-10-CM | POA: Diagnosis not present

## 2015-10-30 DIAGNOSIS — I6523 Occlusion and stenosis of bilateral carotid arteries: Secondary | ICD-10-CM | POA: Diagnosis not present

## 2015-10-30 DIAGNOSIS — I10 Essential (primary) hypertension: Secondary | ICD-10-CM | POA: Diagnosis not present

## 2015-10-30 DIAGNOSIS — E7800 Pure hypercholesterolemia, unspecified: Secondary | ICD-10-CM | POA: Diagnosis not present

## 2015-10-31 ENCOUNTER — Telehealth: Payer: Self-pay | Admitting: Neurology

## 2015-10-31 NOTE — Telephone Encounter (Signed)
Pt returning your call please call before 12:30

## 2015-10-31 NOTE — Telephone Encounter (Signed)
-----   Message from [employer], DO sent at 10/30/2015  5:06 PM EST ----- Let pt know that 40-59% blockage bilaterally and just need to repeat carotids yearly.  Put on schedule as reminder

## 2015-10-31 NOTE — Telephone Encounter (Signed)
Patient made aware of results. Reminder set to have repeat US in one year.

## 2015-10-31 NOTE — Telephone Encounter (Signed)
Left message on machine for patient to call back.

## 2015-11-03 ENCOUNTER — Ambulatory Visit: Payer: Medicare Other | Admitting: Physical Therapy

## 2015-11-06 ENCOUNTER — Ambulatory Visit: Payer: Medicare Other | Attending: Internal Medicine | Admitting: Rehabilitative and Restorative Service Providers"

## 2015-11-06 DIAGNOSIS — R269 Unspecified abnormalities of gait and mobility: Secondary | ICD-10-CM | POA: Insufficient documentation

## 2015-11-06 DIAGNOSIS — R531 Weakness: Secondary | ICD-10-CM | POA: Diagnosis present

## 2015-11-06 NOTE — Therapy (Signed)
[facility] [address] [place], [place], [address] Phone: [phone]   Fax:  [phone]  Physical Therapy Evaluation  Patient Details  Name: [name] MRN: [id number] Date of Birth: 1941/03/07 Referring Provider: Delrae Rend, MD  Encounter Date: 11/06/2015      PT End of Session - 11/06/15 1254    Visit Number 1   Number of Visits 8   Date for PT Re-Evaluation 01/02/16   Authorization Type G code every 10th visit   PT Start Time 1020   PT Stop Time 1105   PT Time Calculation (min) 45 min   Equipment Utilized During Treatment Gait belt   Activity Tolerance Patient tolerated treatment well   Behavior During Therapy [facility] for tasks assessed/performed      Past Medical History  Diagnosis Date  . Diabetes mellitus   . Hypertension   . GERD (gastroesophageal reflux disease)   . Neuropathy ([place])   . PONV (postoperative nausea and vomiting)   . High cholesterol   . Diabetic neuropathy ([place])   . Angina   . Myocardial infarction ([place]) 12/02/11    "they think I might have had a light heart attack this wk"  . Pneumonia   . Bronchitis   . Hypothyroidism   . History of stomach ulcers 1970's  . Arthritis     "in my hands"  . Depression   . S/P CABG (coronary artery bypass graft), 12/04/11 12/07/2011    LIMA to LAD, SVG to OM, SVG to RCA  . Acute renal failure, 01/03/12 01/03/2012  . Diverticulosis   . Hemorrhoids   . Esophageal stricture   . History of nuclear stress test 03/10/2007    dipyridamole; low risk scan   . Coronary artery disease     DR. HILTY IS PT'S CARDIOLOGIST  . Pain     RIGHT KNEE PAIN - TORN RIGHT MEDIAL MENISCUS  . Hiatal hernia     Past Surgical History  Procedure Laterality Date  . Fracture surgery      "put pins both side right ankle"  . Total knee arthroplasty  ~ 2006    left  . Joint replacement    . Back surgery  2006    "cyst growing near my spine"  . Tonsillectomy  1949  .  Abdominal hysterectomy  1980's  . Dilation and curettage of uterus      "a couple times"  . Cesarean section  1977  . Cataract extraction w/ intraocular lens  implant, bilateral  ~ 2010  . Cardiac catheterization  12/02/2011    mild LV dysfunction with mod hypocontractility of mid-distal anterolateral wall; CAD w/ostial tapering of L [place] with 50% diffuse ostial narrowing of LAD, 99% eccentric focal prox LAD stenosis followed by 70% prox LAD stenosis after 1st diag, 20% mid LAD narrowing; 80% ostial-to-prox L Cfx stenosis & 40-50% irregularity of RCA (Dr. Corky Downs)  . Coronary artery bypass graft  12/04/2011    Procedure: CORONARY ARTERY BYPASS GRAFTING (CABG);  Surgeon: Tharon Aquas Adelene Idler, MD;  Location: [place];  Service: Open Heart Surgery;  Laterality: N/A;  CABG x three,  using left internal mammary artery, and right leg greater saphenous vein harvested endoscopically  . Transthoracic echocardiogram  02/19/2013    EF 0000000, grade 1 diastolic dysfunction; mildly thickend/calcified AV leaflets; mildly calcidied MV annulus; mild TR  . Knee arthroscopy with medial menisectomy Right 07/02/2014    Procedure: RIGHT KNEE ARTHROSCOPY WITH PARTIAL MEDIAL MENISTECTOMY,  ABRASION CONDROPLASTYU OF PATELLA,ABRASION CONDROPLASTY OF MEDIAL FEMEROL CONDYL, MICROFRACTURE OF MEDIAL FEMEROL CONDYL;  Surgeon: Tobi Bastos, MD;  Location: [facility];  Service: Orthopedics;  Laterality: Right;  . Left heart catheterization with coronary angiogram N/A 12/02/2011    Procedure: LEFT HEART CATHETERIZATION WITH CORONARY ANGIOGRAM;  Surgeon: Troy Sine, MD;  [facility];  Service: Cardiovascular;  Laterality: N/A;  Coronary angiogram, possible PCI    There were no vitals filed for this visit.  Visit Diagnosis:  Abnormality of gait  Generalized weakness      Subjective Assessment - 11/06/15 1020    Subjective The patient reports initial onset of dizziness 25 years ago (sudden onset with imbalance tx with  meclizine).  She reports her dizziness has returned and symptoms are getting worse.  She also notes imbalance worse with bending or head/body motion,  She reports visually challenging environments (near waves) bother her and quck movements provoke dizziness.  She reports she is moving slow to prevent dizzy spells.     Pertinent History diabetic neuropathy, CABG, NSTEMI, wears hearing aids.   Patient Stated Goals "I feel like I can manage this on my own, but I wish I could do something that would help the dizziness."   Currently in Pain? No/denies            [facility] PT Assessment - 11/06/15 1023    Assessment   Medical Diagnosis BPPV, gait instability   Referring Provider Delrae Rend, MD   Onset Date/Surgical Date --  last 2 years worsening   Prior Therapy none   Precautions   Precautions Fall   Balance Screen   Has the patient fallen in the past 6 months Yes  1 in driveway, 1 on step    How many times? 3-4 times   Has the patient had a decrease in activity level because of a fear of falling?  Yes   Is the patient reluctant to leave their home because of a fear of falling?  No   Home Environment   Living Environment Private residence   Living Arrangements Alone   Type of [place] to enter   Entrance Stairs-Number of Steps 2   [place] or work area in basement  does not go to basement based on family request   Prior Function   Level of Independence Independent  patient reports declining mobility over months   Vocation Retired   Observation/Other Assessments   Focus on Therapeutic Outcomes (FOTO)  64%   Other Surveys  --  DHI=50%   ROM / Strength   AROM / PROM / Strength Strength   Strength   Overall Strength Deficits   Overall Strength Comments UEs 4/5 for bilateral shoulder flexion, bilateral hip flexion 3/5, bilateral knee flexion/extension is 4/5, bilateral ankle dorsiflexion is 3/5.    Ambulation/Gait   Ambulation/Gait Yes    Ambulation/Gait Assistance 7: Independent   Gait Pattern --  L foot slap notes, wider base and veering from midline   Ambulation Surface Level   Gait velocity 2.16 ft/sec   Stairs Yes   Stairs Assistance 6: Modified independent (Device/Increase time)   Stair Management Technique Two rails;Alternating pattern   Standardized Balance Assessment   Standardized Balance Assessment Berg Balance Test;Dynamic Gait Index   Berg Balance Test   Sit to Stand Able to stand without using hands and stabilize independently   Standing Unsupported Able to stand safely 2 minutes   Sitting with  Back Unsupported but Feet Supported on Floor or Stool Able to sit safely and securely 2 minutes   Stand to Sit Sits safely with minimal use of hands   Transfers Able to transfer safely, minor use of hands   Standing Unsupported with Eyes Closed Able to stand 10 seconds with supervision   Standing Ubsupported with Feet Together Able to place feet together independently and stand 1 minute safely  had minimal sway, eyes closed held 28 sec with sup   From Standing, Reach Forward with Outstretched Arm Can reach forward >5 cm safely (2")   From Standing Position, Pick up Object from Floor Able to pick up shoe safely and easily   From Standing Position, Turn to Look Behind Over each Shoulder Looks behind from both sides and weight shifts well   Turn 360 Degrees Needs assistance while turning   Standing Unsupported, Alternately Place Feet on Step/Stool Needs assistance to keep from falling or unable to try   Standing Unsupported, One Foot in Front Able to take small step independently and hold 30 seconds   Standing on One Leg Tries to lift leg/unable to hold 3 seconds but remains standing independently   Total Score 40   Berg comment: 40/56 indicating high fall risk   Dynamic Gait Index   Level Surface Mild Impairment   Gait with Horizontal Head Turns Moderate Impairment   Gait with Vertical Head Turns Mild Impairment    Steps Mild Impairment   DGI comment: Full DGI not completed            Vestibular Assessment - 11/06/15 1038    Vestibular Assessment   General Observation Patient ambulates into clinic independently   Symptom Behavior   Type of Dizziness Imbalance  hard to focus, blurring   Frequency of Dizziness daily   Duration of Dizziness intermittent/   Aggravating Factors Turning body quickly;Turning head quickly;Looking up to the ceiling;Activity in general   Relieving Factors Rest   Occulomotor Exam   Occulomotor Alignment Normal   Spontaneous Absent   Gaze-induced Absent   Smooth Pursuits Intact   Saccades Intact   Vestibulo-Occular Reflex   Comment positive head thrust test bilaterally   Positional Testing   Dix-Hallpike Dix-Hallpike Right;Dix-Hallpike Left   Sidelying Test Sidelying Right;Sidelying Left   Horizontal Canal Testing Horizontal Canal Right;Horizontal Canal Left   Dix-Hallpike Right   Dix-Hallpike Right Duration mild report of dizziness x 10 seconds   Dix-Hallpike Right Symptoms No nystagmus   Dix-Hallpike Left   Dix-Hallpike Left Duration x seconds, mile report   Dix-Hallpike Left Symptoms No nystagmus  viewed in room light   Sidelying Right   Sidelying Right Duration none   Sidelying Right Symptoms No nystagmus   Sidelying Left   Sidelying Left Duration none   Sidelying Left Symptoms No nystagmus   Horizontal Canal Right   Horizontal Canal Right Duration none   Horizontal Canal Right Symptoms Normal   Horizontal Canal Left   Horizontal Canal Left Duration mild dizziness   Horizontal Canal Left Symptoms Ageotrophic  note very mild nystagmus with minimal subjective report      NEUROMUSCULAR RE-EDUCATION: Provided habituation and gaze exercises for HEP.      PT Education - 11/06/15 1253    Education provided Yes   Education Details HEP: habituation rolling supine<>R and L sidelying, and gaze x 1 viewing seated   Person(s) Educated Patient   Methods  Explanation;Demonstration;Handout   Comprehension Verbalized understanding;Returned demonstration  PT Short Term Goals - 11/06/15 1255    PT SHORT TERM GOAL #1   Title The patient will be independent with HEP for habituation, gaze, balance and general mobility.   Baseline Target date 12/07/2015   Time 4   Period Weeks   PT SHORT TERM GOAL #2   Title The patient will improve Berg from 40/56 to > or equal to 45/56 to demo decreasing risk for falls.   Baseline Target date 12/07/2015   Time 4   Period Weeks   PT SHORT TERM GOAL #3   Title The patient will improve gait speed from 2.16 ft/sec to > or equal to 2.4 ft/sec to demo improving functional moblity.   Baseline Target date 12/07/2015   Time 4   Period Weeks   PT SHORT TERM GOAL #4   Title The patient will move floor<>stand with UE support due to h/o falls.   Baseline Target date 12/07/2015   Time 4   Period Weeks           PT Long Term Goals - 11/06/15 1300    PT LONG TERM GOAL #1   Title The patient will reduce dizziness handicap index from 50% to < or equal to 35% to demo decreased self perception of dizziness.   Baseline Target date 01/04/2016   Time 8   Period Weeks   PT LONG TERM GOAL #2   Title The patient will improve gait speed to > or equal to 2.62 ft/sec to demo transition to "full community ambulator" classification of gait.   Baseline Target date 01/04/2016   Time 8   Period Weeks   PT LONG TERM GOAL #3   Title The patient will improve Berg score to > or equal to 48/56 to demo decreased risk for falls.   Baseline Target date 01/04/2016   Time 8   Period Weeks   PT LONG TERM GOAL #4   Title The patient will perform gait with head turns without veering from midline.     Baseline Target date 01/04/2016   Time 8   Period Weeks               Plan - 11/06/15 1412    Clinical Impression Statement The patient is a 75 yo female presenting to outpatient physical therapy with mild nystagmus noted  with horizontal rolling, decreased VOR per positive head thrust testing, imbalance due to multi-sensory impairments, and decreased strength.   Pt will benefit from skilled therapeutic intervention in order to improve on the following deficits Abnormal gait;Decreased activity tolerance;Decreased balance;Decreased mobility;Decreased strength;Postural dysfunction;Dizziness;Difficulty walking   Rehab Potential Good   PT Frequency 2x / week   PT Duration 6 weeks  plan to decrease to 1x/week after 2 weeks due to patient's copay per her request   PT Treatment/Interventions ADLs/Self Care Home Management;Balance training;Neuromuscular re-education;Functional mobility training;Gait training;Therapeutic activities;Therapeutic exercise;Stair training;Patient/family education;Canalith Repostioning;Vestibular   PT Next Visit Plan Check progress with HEP, add balance HEP and LE strengthening, progress gait/dynamic gait tasks   Consulted and Agree with Plan of Care Patient         Problem List Patient Active Problem List   Diagnosis Date Noted  . Memory loss 03/14/2015  . Chest tightness 09/16/2014  . Osteoarthritis of right knee 07/02/2014  . Acute medial meniscus tear of right knee 07/02/2014  . Diabetic peripheral neuropathy associated with type 1 diabetes mellitus ([place]) 09/05/2013  . Edema of extremities 04/18/2013  . Cellulitis 04/18/2013  . HTN (  hypertension) 01/06/2012  . Acute renal failure,admitted 01/03/12, after admission for diastolic chf [id number]  . Acute diastolic chf 3/1 to [id number] 12/27/2011  . PAF post CABG, NSR now 12/24/2011  . S/P CABG (coronary artery bypass graft), 12/04/11 12/07/2011  . Hyperlipidemia LDL goal <70 12/03/2011    Class: Diagnosis of  . NSTEMI (non-ST elevated myocardial infarction) ([place]) 12/01/2011  . Family history of early CAD 12/01/2011  . DIABETES MELLITUS-TYPE II 12/14/2010  . DYSPHAGIA UNSPECIFIED 12/14/2010  . ABDOMINAL PAIN, EPIGASTRIC 12/14/2010  .  ESOPHAGEAL STRICTURE 11/28/2009  . GERD 11/28/2009  . CONSTIPATION 11/28/2009  . FLATULENCE-GAS-BLOATING 11/28/2009  . CONSTIPATION, CHRONIC 12/19/2007  . SALPINGO-OOPHORITIS 12/19/2007  . MENOPAUSE, SURGICAL 12/19/2007  . BACK PAIN, LUMBAR 12/19/2007  . ANKLE INJURY, RIGHT 12/19/2007  . TOTAL KNEE REPLACEMENT, LEFT, HX OF 12/19/2007    WEAVER,CHRISTINA, PT 11/06/2015, 2:17 PM  [facility] [address] [place], [place], [address] Phone: [phone]   Fax:  [phone]  Name: [name] MRN: [id number] Date of Birth: 1941/08/29

## 2015-11-06 NOTE — Patient Instructions (Signed)
Tip Card 1.The goal of habituation training is to assist in decreasing symptoms of vertigo, dizziness, or nausea provoked by specific head and body motions. 2.These exercises may initially increase symptoms; however, be persistent and work through symptoms. With repetition and time, the exercises will assist in reducing or eliminating symptoms. 3.Exercises should be stopped and discussed with the therapist if you experience any of the following: - Sudden change or fluctuation in hearing - New onset of ringing in the ears, or increase in current intensity - Any fluid discharge from the ear - Severe pain in neck or back - Extreme nausea  Copyright  VHI. All rights reserved.  Rolling   With pillow under head, start on back. Roll to your right side.  Hold until dizziness stops, plus 20 seconds and then roll to the left side.  Hold until dizziness stops, plus 20 seconds.  Repeat sequence 5 times per session. Do 2 sessions per day.  Copyright  VHI. All rights reserved.     Gaze Stabilization: Tip Card 1.Target must remain in focus, not blurry, and appear stationary while head is in motion. 2.Perform exercises with small head movements (45 to either side of midline). 3.Increase speed of head motion so long as target is in focus. 4.If you wear eyeglasses, be sure you can see target through lens (therapist will give specific instructions for bifocal / progressive lenses). 5.These exercises may provoke dizziness or nausea. Work through these symptoms. If too dizzy, slow head movement slightly. Rest between each exercise. 6.Exercises demand concentration; avoid distractions. 7.For safety, perform standing exercises close to a counter, wall, corner, or next to someone.  Copyright  VHI. All rights reserved.  Gaze Stabilization: Standing Feet Apart   Feet shoulder width apart, keeping eyes on target on wall 3 feet away, tilt head down slightly and move head side to side for 30 seconds. Repeat while  moving head up and down for 30 seconds. Do 2 sessions per day.   Copyright  VHI. All rights reserved.

## 2015-11-08 ENCOUNTER — Other Ambulatory Visit: Payer: Self-pay | Admitting: Internal Medicine

## 2015-11-10 ENCOUNTER — Ambulatory Visit: Payer: Medicare Other | Admitting: Rehabilitative and Restorative Service Providers"

## 2015-11-10 NOTE — Telephone Encounter (Signed)
Rx request sent to pharmacy.  

## 2015-11-14 ENCOUNTER — Ambulatory Visit: Payer: Medicare Other | Admitting: Rehabilitative and Restorative Service Providers"

## 2015-11-14 DIAGNOSIS — R269 Unspecified abnormalities of gait and mobility: Secondary | ICD-10-CM | POA: Diagnosis not present

## 2015-11-14 DIAGNOSIS — R531 Weakness: Secondary | ICD-10-CM

## 2015-11-14 NOTE — Patient Instructions (Signed)
Tip Card 1.The goal of habituation training is to assist in decreasing symptoms of vertigo, dizziness, or nausea provoked by specific head and body motions. 2.These exercises may initially increase symptoms; however, be persistent and work through symptoms. With repetition and time, the exercises will assist in reducing or eliminating symptoms. 3.Exercises should be stopped and discussed with the therapist if you experience any of the following: - Sudden change or fluctuation in hearing - New onset of ringing in the ears, or increase in current intensity - Any fluid discharge from the ear - Severe pain in neck or back - Extreme nausea  Copyright  VHI. All rights reserved.  Rolling   With pillow under head, start on back. Roll to your right side.  Hold until dizziness stops, plus 20 seconds and then roll to the left side.  Hold until dizziness stops, plus 20 seconds.  Repeat sequence 5 times per session. Do 2 sessions per day.  Copyright  VHI. All rights reserved.    Gaze Stabilization: Tip Card 1.Target must remain in focus, not blurry, and appear stationary while head is in motion. 2.Perform exercises with small head movements (45 to either side of midline). 3.Increase speed of head motion so long as target is in focus. 4.If you wear eyeglasses, be sure you can see target through lens (therapist will give specific instructions for bifocal / progressive lenses). 5.These exercises may provoke dizziness or nausea. Work through these symptoms. If too dizzy, slow head movement slightly. Rest between each exercise. 6.Exercises demand concentration; avoid distractions. 7.For safety, perform standing exercises close to a counter, wall, corner, or next to someone.  Copyright  VHI. All rights reserved.  Gaze Stabilization: Standing Feet Apart   Feet shoulder width apart, keeping eyes on target on wall 3 feet away, tilt head down slightly and move head side to side for 30 seconds. Repeat while  moving head up and down for 30 seconds. Do 2 sessions per day.   Copyright  VHI. All rights reserved.   Feet Apart, Varied Arm Positions - Eyes Closed    Stand with feet shoulder width apart and arms at your side. Close eyes and visualize upright position. Hold _30___ seconds. Repeat _3___ times per session. Do _2___ sessions per day.  Copyright  VHI. All rights reserved.  Feet Partial Heel-Toe, Varied Arm Positions - Eyes Open    With eyes open, right foot partially in front of the other, arms out, look straight ahead at a stationary object. Hold __30__ seconds. Repeat _3___ times with each foot forward per session. Do __2__ sessions per day.  Copyright  VHI. All rights reserved.  Toe / Heel Raise    Gently rock back on heels and raise toes. Then rock forward on toes and raise heels. Repeat sequence __10__ times per session. Do _2___ sessions per week.  Copyright  VHI. All rights reserved.

## 2015-11-14 NOTE — Therapy (Signed)
[facility] [address] [place], [place], [address] Phone: [phone]   Fax:  [phone]  Physical Therapy Treatment  Patient Details  Name: [name] MRN: [id number] Date of Birth: 04-28-1941 Referring Provider: Delrae Rend, MD  Encounter Date: 11/14/2015      PT End of Session - 11/14/15 1617    Visit Number 2   Number of Visits 8   Date for PT Re-Evaluation 01/02/16   Authorization Type G code every 10th visit   PT Start Time 1025   PT Stop Time 1105   PT Time Calculation (min) 40 min   Equipment Utilized During Treatment Gait belt   Activity Tolerance Patient tolerated treatment well   Behavior During Therapy [facility] for tasks assessed/performed      Past Medical History  Diagnosis Date  . Diabetes mellitus   . Hypertension   . GERD (gastroesophageal reflux disease)   . Neuropathy ([place])   . PONV (postoperative nausea and vomiting)   . High cholesterol   . Diabetic neuropathy ([place])   . Angina   . Myocardial infarction ([place]) 12/02/11    "they think I might have had a light heart attack this wk"  . Pneumonia   . Bronchitis   . Hypothyroidism   . History of stomach ulcers 1970's  . Arthritis     "in my hands"  . Depression   . S/P CABG (coronary artery bypass graft), 12/04/11 12/07/2011    LIMA to LAD, SVG to OM, SVG to RCA  . Acute renal failure, 01/03/12 01/03/2012  . Diverticulosis   . Hemorrhoids   . Esophageal stricture   . History of nuclear stress test 03/10/2007    dipyridamole; low risk scan   . Coronary artery disease     DR. HILTY IS PT'S CARDIOLOGIST  . Pain     RIGHT KNEE PAIN - TORN RIGHT MEDIAL MENISCUS  . Hiatal hernia     Past Surgical History  Procedure Laterality Date  . Fracture surgery      "put pins both side right ankle"  . Total knee arthroplasty  ~ 2006    left  . Joint replacement    . Back surgery  2006    "cyst growing near my spine"  . Tonsillectomy  1949  .  Abdominal hysterectomy  1980's  . Dilation and curettage of uterus      "a couple times"  . Cesarean section  1977  . Cataract extraction w/ intraocular lens  implant, bilateral  ~ 2010  . Cardiac catheterization  12/02/2011    mild LV dysfunction with mod hypocontractility of mid-distal anterolateral wall; CAD w/ostial tapering of L Main with 50% diffuse ostial narrowing of LAD, 99% eccentric focal prox LAD stenosis followed by 70% prox LAD stenosis after 1st diag, 20% mid LAD narrowing; 80% ostial-to-prox L Cfx stenosis & 40-50% irregularity of RCA (Dr. Corky Downs)  . Coronary artery bypass graft  12/04/2011    Procedure: CORONARY ARTERY BYPASS GRAFTING (CABG);  Surgeon: Tharon Aquas Adelene Idler, MD;  Location: [facility];  Service: Open Heart Surgery;  Laterality: N/A;  CABG x three,  using left internal mammary artery, and right leg greater saphenous vein harvested endoscopically  . Transthoracic echocardiogram  02/19/2013    EF 0000000, grade 1 diastolic dysfunction; mildly thickend/calcified AV leaflets; mildly calcidied MV annulus; mild TR  . Knee arthroscopy with medial menisectomy Right 07/02/2014    Procedure: RIGHT KNEE ARTHROSCOPY WITH PARTIAL MEDIAL MENISTECTOMY,  ABRASION CONDROPLASTYU OF PATELLA,ABRASION CONDROPLASTY OF MEDIAL FEMEROL CONDYL, MICROFRACTURE OF MEDIAL FEMEROL CONDYL;  Surgeon: Tobi Bastos, MD;  Location: [facility];  Service: Orthopedics;  Laterality: Right;  . Left heart catheterization with coronary angiogram N/A 12/02/2011    Procedure: LEFT HEART CATHETERIZATION WITH CORONARY ANGIOGRAM;  Surgeon: Troy Sine, MD;  Location: [facility];  Service: Cardiovascular;  Laterality: N/A;  Coronary angiogram, possible PCI    There were no vitals filed for this visit.  Visit Diagnosis:  Abnormality of gait  Generalized weakness      Subjective Assessment - 11/14/15 1032    Subjective "I don't know how this is going to help.  The exercise makes me dizzy."   Patient Stated Goals "I feel  like I can manage this on my own, but I wish I could do something that would help the dizziness."   Currently in Pain? Yes   Pain Score --  mild       NEUROMUSCULAR RE-EDUCATION: Balance HEP established adding feet apart with eyes closed and feet partial heel/toe with eyes open Standing feet together with eyes closed with CGA to min A Standing head turns with CGA Reviewed gaze x 1 viewing Reviewed habituation horizontal roll and performed sit<>sidelying without dizziness today  Dynamic activities including sidestepping, sidestepping with UEs abducted for increased core motion  THERAPEUTIC EXERCISE: Ankle heel/toe raises near countertop decreasing UE support Bridges x 10 reps for low back (reporting she saw chiropractor 3 times in the past week due to pain)  Gait: Dynamic gait activities performing walking with horizontal and vertical head turns every 3 steps with min A due to veering Gait with start/stops and emphasis on heel strike with ambulation and longer stride length       PT Education - 11/14/15 1613    Education provided Yes   Education Details HEP: heel/toe raises, feet apart + eyes closed, partial tandem stance, gaze x 1, horizontal roll   Person(s) Educated Patient   Methods Explanation;Demonstration;Handout   Comprehension Verbalized understanding;Returned demonstration          PT Short Term Goals - 11/06/15 1255    PT SHORT TERM GOAL #1   Title The patient will be independent with HEP for habituation, gaze, balance and general mobility.   Baseline Target date 12/07/2015   Time 4   Period Weeks   PT SHORT TERM GOAL #2   Title The patient will improve Berg from 40/56 to > or equal to 45/56 to demo decreasing risk for falls.   Baseline Target date 12/07/2015   Time 4   Period Weeks   PT SHORT TERM GOAL #3   Title The patient will improve gait speed from 2.16 ft/sec to > or equal to 2.4 ft/sec to demo improving functional moblity.   Baseline Target date  12/07/2015   Time 4   Period Weeks   PT SHORT TERM GOAL #4   Title The patient will move floor<>stand with UE support due to h/o falls.   Baseline Target date 12/07/2015   Time 4   Period Weeks           PT Long Term Goals - 11/06/15 1300    PT LONG TERM GOAL #1   Title The patient will reduce dizziness handicap index from 50% to < or equal to 35% to demo decreased self perception of dizziness.   Baseline Target date 01/04/2016   Time 8   Period Weeks   PT LONG TERM GOAL #  2   Title The patient will improve gait speed to > or equal to 2.62 ft/sec to demo transition to "full community ambulator" classification of gait.   Baseline Target date 01/04/2016   Time 8   Period Weeks   PT LONG TERM GOAL #3   Title The patient will improve Berg score to > or equal to 48/56 to demo decreased risk for falls.   Baseline Target date 01/04/2016   Time 8   Period Weeks   PT LONG TERM GOAL #4   Title The patient will perform gait with head turns without veering from midline.     Baseline Target date 01/04/2016   Time 8   Period Weeks               Plan - 11/14/15 1618    Clinical Impression Statement The patient and PT discussed purpose of therapy and that exercises may increase symptoms initially.  Progressed HEP to focus on ankle strengthening and balance tasks + continued vestibular exercises.  Continue working towards STG and LTGs.   Pt will benefit from skilled therapeutic intervention in order to improve on the following deficits Abnormal gait;Decreased activity tolerance;Decreased balance;Decreased mobility;Decreased strength;Postural dysfunction;Dizziness;Difficulty walking   Rehab Potential Good   PT Frequency 2x / week   PT Duration 6 weeks   PT Treatment/Interventions ADLs/Self Care Home Management;Balance training;Neuromuscular re-education;Functional mobility training;Gait training;Therapeutic activities;Therapeutic exercise;Stair training;Patient/family education;Canalith  Repostioning;Vestibular   PT Next Visit Plan dynamic gait and balance tasks.   Consulted and Agree with Plan of Care Patient        Problem List Patient Active Problem List   Diagnosis Date Noted  . Memory loss 03/14/2015  . Chest tightness 09/16/2014  . Osteoarthritis of right knee 07/02/2014  . Acute medial meniscus tear of right knee 07/02/2014  . Diabetic peripheral neuropathy associated with type 1 diabetes mellitus ([place]) 09/05/2013  . Edema of extremities 04/18/2013  . Cellulitis 04/18/2013  . HTN (hypertension) 01/06/2012  . Acute renal failure,admitted 01/03/12, after admission for diastolic chf [id number]  . Acute diastolic chf 3/1 to [id number] 12/27/2011  . PAF post CABG, NSR now 12/24/2011  . S/P CABG (coronary artery bypass graft), 12/04/11 12/07/2011  . Hyperlipidemia LDL goal <70 12/03/2011    Class: Diagnosis of  . NSTEMI (non-ST elevated myocardial infarction) ([place]) 12/01/2011  . Family history of early CAD 12/01/2011  . DIABETES MELLITUS-TYPE II 12/14/2010  . DYSPHAGIA UNSPECIFIED 12/14/2010  . ABDOMINAL PAIN, EPIGASTRIC 12/14/2010  . ESOPHAGEAL STRICTURE 11/28/2009  . GERD 11/28/2009  . CONSTIPATION 11/28/2009  . FLATULENCE-GAS-BLOATING 11/28/2009  . CONSTIPATION, CHRONIC 12/19/2007  . SALPINGO-OOPHORITIS 12/19/2007  . MENOPAUSE, SURGICAL 12/19/2007  . BACK PAIN, LUMBAR 12/19/2007  . ANKLE INJURY, RIGHT 12/19/2007  . TOTAL KNEE REPLACEMENT, LEFT, HX OF 12/19/2007    WEAVER,CHRISTINA, PT 11/14/2015, 4:19 PM  [facility] [address] [place], [place], [address] Phone: [phone]   Fax:  [phone]  Name: [name] MRN: [id number] Date of Birth: 1941/05/27

## 2015-11-19 ENCOUNTER — Ambulatory Visit: Payer: Medicare Other | Admitting: Rehabilitative and Restorative Service Providers"

## 2015-11-19 DIAGNOSIS — R269 Unspecified abnormalities of gait and mobility: Secondary | ICD-10-CM | POA: Diagnosis not present

## 2015-11-19 DIAGNOSIS — R531 Weakness: Secondary | ICD-10-CM

## 2015-11-19 NOTE — Therapy (Signed)
[facility] [address] [place], [place], [address] Phone: [phone]   Fax:  [phone]  Physical Therapy Treatment  Patient Details  Name: [name] MRN: [id number] Date of Birth: 07/11/1941 Referring Provider: Delrae Rend, MD  Encounter Date: 11/19/2015      PT End of Session - 11/19/15 2044    Visit Number 3   Number of Visits 8   Date for PT Re-Evaluation 01/02/16   Authorization Type G code every 10th visit   PT Start Time 1148   PT Stop Time 1230   PT Time Calculation (min) 42 min   Equipment Utilized During Treatment Gait belt   Activity Tolerance Patient tolerated treatment well   Behavior During Therapy [facility] for tasks assessed/performed      Past Medical History  Diagnosis Date  . Diabetes mellitus   . Hypertension   . GERD (gastroesophageal reflux disease)   . Neuropathy ([place])   . PONV (postoperative nausea and vomiting)   . High cholesterol   . Diabetic neuropathy ([place])   . Angina   . Myocardial infarction ([place]) 12/02/11    "they think I might have had a light heart attack this wk"  . Pneumonia   . Bronchitis   . Hypothyroidism   . History of stomach ulcers 1970's  . Arthritis     "in my hands"  . Depression   . S/P CABG (coronary artery bypass graft), 12/04/11 12/07/2011    LIMA to LAD, SVG to OM, SVG to RCA  . Acute renal failure, 01/03/12 01/03/2012  . Diverticulosis   . Hemorrhoids   . Esophageal stricture   . History of nuclear stress test 03/10/2007    dipyridamole; low risk scan   . Coronary artery disease     DR. HILTY IS PT'S CARDIOLOGIST  . Pain     RIGHT KNEE PAIN - TORN RIGHT MEDIAL MENISCUS  . Hiatal hernia     Past Surgical History  Procedure Laterality Date  . Fracture surgery      "put pins both side right ankle"  . Total knee arthroplasty  ~ 2006    left  . Joint replacement    . Back surgery  2006    "cyst growing near my spine"  . Tonsillectomy  1949  .  Abdominal hysterectomy  1980's  . Dilation and curettage of uterus      "a couple times"  . Cesarean section  1977  . Cataract extraction w/ intraocular lens  implant, bilateral  ~ 2010  . Cardiac catheterization  12/02/2011    mild LV dysfunction with mod hypocontractility of mid-distal anterolateral wall; CAD w/ostial tapering of L Main with 50% diffuse ostial narrowing of LAD, 99% eccentric focal prox LAD stenosis followed by 70% prox LAD stenosis after 1st diag, 20% mid LAD narrowing; 80% ostial-to-prox L Cfx stenosis & 40-50% irregularity of RCA (Dr. Corky Downs)  . Coronary artery bypass graft  12/04/2011    Procedure: CORONARY ARTERY BYPASS GRAFTING (CABG);  Surgeon: Tharon Aquas Adelene Idler, MD;  Location: [facility];  Service: Open Heart Surgery;  Laterality: N/A;  CABG x three,  using left internal mammary artery, and right leg greater saphenous vein harvested endoscopically  . Transthoracic echocardiogram  02/19/2013    EF 0000000, grade 1 diastolic dysfunction; mildly thickend/calcified AV leaflets; mildly calcidied MV annulus; mild TR  . Knee arthroscopy with medial menisectomy Right 07/02/2014    Procedure: RIGHT KNEE ARTHROSCOPY WITH PARTIAL MEDIAL MENISTECTOMY,  ABRASION CONDROPLASTYU OF PATELLA,ABRASION CONDROPLASTY OF MEDIAL FEMEROL CONDYL, MICROFRACTURE OF MEDIAL FEMEROL CONDYL;  Surgeon: Tobi Bastos, MD;  Location: [facility];  Service: Orthopedics;  Laterality: Right;  . Left heart catheterization with coronary angiogram N/A 12/02/2011    Procedure: LEFT HEART CATHETERIZATION WITH CORONARY ANGIOGRAM;  Surgeon: Troy Sine, MD;  Location: [facility] CATH LAB;  Service: Cardiovascular;  Laterality: N/A;  Coronary angiogram, possible PCI    There were no vitals filed for this visit.  Visit Diagnosis:  Abnormality of gait  Generalized weakness      Subjective Assessment - 11/19/15 1152    Subjective The patient reports that raising up on her toes aggravates toe pain for HEP.  She reports that she is  tolerating exercise well for dizziness.  She reports that she got dizzy yesterday when looking under the couch.     Currently in Pain? Yes   Pain Score 2    Pain Location Foot   Pain Orientation Right   Pain Descriptors / Indicators Aching   Pain Type Chronic pain   Pain Onset More than a month ago   Pain Frequency Intermittent   Aggravating Factors  worse with home execise   Pain Relieving Factors rest           [facility] Adult PT Treatment/Exercise - 11/19/15 1206    Ambulation/Gait   Ambulation/Gait Yes   Ambulation/Gait Assistance 4: Min guard   Ambulation Distance (Feet) 500 Feet, 230 feet x 3 reps   Gait Pattern --  L foot slap notes, wider base and veering from midline   Ambulation Surface Level   Gait Comments Gait training with horizontal and vertical head turns with veering from midline and requiring min to mod A due to imbalance.  Patient also performed diagonal head turns and rates 7/10 dizziness.   One loss of balance requiring min A to recover.         Vestibular Treatment/Exercise - 11/19/15 1155    Vestibular Treatment/Exercise   Vestibular Treatment Provided Habituation;Gaze   Habituation Exercises 180 degree Turns;Comment  diagonal bends towards knee   Gaze Exercises X1 Viewing Horizontal   180 degree Turns   Symptom Description  figure 8 walking with min A due to imbalance, standing turns near countertop.    X1 Viewing Horizontal   Foot Position standing feet apart   Reps 3   Comments 30 second intervals with cues on maintaining fixation on target    Other habituation including: seated bend forward with return to sitting x 5 reps x 3 sets Standing eyes/head, then body turns for visual fixation and spotting Seated eye/head coordination with compensatory saccades Standing head turns with visual spotting.         PT Short Term Goals - 11/06/15 1255    PT SHORT TERM GOAL #1   Title The patient will be independent with HEP for habituation, gaze,  balance and general mobility.   Baseline Target date 12/07/2015   Time 4   Period Weeks   PT SHORT TERM GOAL #2   Title The patient will improve Berg from 40/56 to > or equal to 45/56 to demo decreasing risk for falls.   Baseline Target date 12/07/2015   Time 4   Period Weeks   PT SHORT TERM GOAL #3   Title The patient will improve gait speed from 2.16 ft/sec to > or equal to 2.4 ft/sec to demo improving functional moblity.   Baseline Target date 12/07/2015   Time 4  Period Weeks   PT SHORT TERM GOAL #4   Title The patient will move floor<>stand with UE support due to h/o falls.   Baseline Target date 12/07/2015   Time 4   Period Weeks           PT Long Term Goals - 11/06/15 1300    PT LONG TERM GOAL #1   Title The patient will reduce dizziness handicap index from 50% to < or equal to 35% to demo decreased self perception of dizziness.   Baseline Target date 01/04/2016   Time 8   Period Weeks   PT LONG TERM GOAL #2   Title The patient will improve gait speed to > or equal to 2.62 ft/sec to demo transition to "full community ambulator" classification of gait.   Baseline Target date 01/04/2016   Time 8   Period Weeks   PT LONG TERM GOAL #3   Title The patient will improve Berg score to > or equal to 48/56 to demo decreased risk for falls.   Baseline Target date 01/04/2016   Time 8   Period Weeks   PT LONG TERM GOAL #4   Title The patient will perform gait with head turns without veering from midline.     Baseline Target date 01/04/2016   Time 8   Period Weeks               Plan - 11/19/15 1433    Clinical Impression Statement The patient had increased unsteadiness today possibly due to slide on shoewear.  PT educated her on using tennis shoes/sneakers for therapy and that slide on type shoes increased her shuffling steps and are not safe for her during community mobility.  Continue working towards [place].   PT Next Visit Plan dynamic gait and balance tasks-  check progress with HEP.   Consulted and Agree with Plan of Care Patient        Problem List Patient Active Problem List   Diagnosis Date Noted  . Memory loss 03/14/2015  . Chest tightness 09/16/2014  . Osteoarthritis of right knee 07/02/2014  . Acute medial meniscus tear of right knee 07/02/2014  . Diabetic peripheral neuropathy associated with type 1 diabetes mellitus ([place]) 09/05/2013  . Edema of extremities 04/18/2013  . Cellulitis 04/18/2013  . HTN (hypertension) 01/06/2012  . Acute renal failure,admitted 01/03/12, after admission for diastolic chf [id number]  . Acute diastolic chf 3/1 to [id number] 12/27/2011  . PAF post CABG, NSR now 12/24/2011  . S/P CABG (coronary artery bypass graft), 12/04/11 12/07/2011  . Hyperlipidemia LDL goal <70 12/03/2011    Class: Diagnosis of  . NSTEMI (non-ST elevated myocardial infarction) ([place]) 12/01/2011  . Family history of early CAD 12/01/2011  . DIABETES MELLITUS-TYPE II 12/14/2010  . DYSPHAGIA UNSPECIFIED 12/14/2010  . ABDOMINAL PAIN, EPIGASTRIC 12/14/2010  . ESOPHAGEAL STRICTURE 11/28/2009  . GERD 11/28/2009  . CONSTIPATION 11/28/2009  . FLATULENCE-GAS-BLOATING 11/28/2009  . CONSTIPATION, CHRONIC 12/19/2007  . SALPINGO-OOPHORITIS 12/19/2007  . MENOPAUSE, SURGICAL 12/19/2007  . BACK PAIN, LUMBAR 12/19/2007  . ANKLE INJURY, RIGHT 12/19/2007  . TOTAL KNEE REPLACEMENT, LEFT, HX OF 12/19/2007    WEAVER,CHRISTINA, PT 11/20/2015, 2:35 PM  [facility] [address] [place], [place], [address] Phone: [phone]   Fax:  [phone]  Name: [name] MRN: [id number] Date of Birth: 1941/03/02

## 2015-11-21 ENCOUNTER — Ambulatory Visit: Payer: Medicare Other | Admitting: Rehabilitative and Restorative Service Providers"

## 2015-11-25 ENCOUNTER — Ambulatory Visit: Payer: Medicare Other | Admitting: Rehabilitative and Restorative Service Providers"

## 2015-11-25 DIAGNOSIS — R269 Unspecified abnormalities of gait and mobility: Secondary | ICD-10-CM | POA: Diagnosis not present

## 2015-11-25 DIAGNOSIS — R531 Weakness: Secondary | ICD-10-CM

## 2015-11-25 NOTE — Patient Instructions (Signed)
Tip Card 1.The goal of habituation training is to assist in decreasing symptoms of vertigo, dizziness, or nausea provoked by specific head and body motions. 2.These exercises may initially increase symptoms; however, be persistent and work through symptoms. With repetition and time, the exercises will assist in reducing or eliminating symptoms. 3.Exercises should be stopped and discussed with the therapist if you experience any of the following: - Sudden change or fluctuation in hearing - New onset of ringing in the ears, or increase in current intensity - Any fluid discharge from the ear - Severe pain in neck or back - Extreme nausea  Copyright  VHI. All rights reserved.  Rolling   With pillow under head, start on back. Roll to your right side. Hold until dizziness stops, plus 20 seconds and then roll to the left side. Hold until dizziness stops, plus 20 seconds. Repeat sequence 5 times per session. Do 2 sessions per day.  Copyright  VHI. All rights reserved.   Gaze Stabilization: Tip Card 1.Target must remain in focus, not blurry, and appear stationary while head is in motion. 2.Perform exercises with small head movements (45 to either side of midline). 3.Increase speed of head motion so long as target is in focus. 4.If you wear eyeglasses, be sure you can see target through lens (therapist will give specific instructions for bifocal / progressive lenses). 5.These exercises may provoke dizziness or nausea. Work through these symptoms. If too dizzy, slow head movement slightly. Rest between each exercise. 6.Exercises demand concentration; avoid distractions. 7.For safety, perform standing exercises close to a counter, wall, corner, or next to someone.  Copyright  VHI. All rights reserved.  Gaze Stabilization: Standing Feet Apart   Feet shoulder width apart, keeping eyes on target on wall 3 feet away, tilt head down slightly and move head side to side for 30 seconds. Repeat  while moving head up and down for 30 seconds. Do 2 sessions per day.   Copyright  VHI. All rights reserved.   Feet Together, Varied Arm Positions - Eyes Closed   Stand with feet together and arms at your side. Close eyes and visualize upright position. Hold _30___ seconds. Repeat _3___ times per session. Do _2___ sessions per day.  Copyright  VHI. All rights reserved.  Feet Partial Heel-Toe, Varied Arm Positions - Eyes Open    With eyes open, right foot partially in front of the other, arms out, look straight ahead at a stationary object. Hold __30__ seconds. Repeat _3___ times with each foot forward per session. Do __2__ sessions per day.  Copyright  VHI. All rights reserved.  Toe / Heel Raise    Gently rock back on heels and raise toes. Then rock forward on toes and raise heels. Repeat sequence __10__ times per session. Do _2___ sessions per week.  Copyright  VHI. All rights reserved.

## 2015-11-26 NOTE — Therapy (Signed)
[facility] [address] [place], [place], [address] Phone: [phone]   Fax:  [phone]  Physical Therapy Treatment  Patient Details  Name: [name] MRN: [id number] Date of Birth: [birth year] Referring Provider: Delrae Rend, MD  Encounter Date: 11/25/2015      PT End of Session - 11/26/15 1054    Visit Number 4   Number of Visits 8   Date for PT Re-Evaluation 01/02/16   Authorization Type G code every 10th visit   PT Start Time 1158   PT Stop Time 1238   PT Time Calculation (min) 40 min   Equipment Utilized During Treatment Gait belt   Activity Tolerance Patient tolerated treatment well   Behavior During Therapy [facility] for tasks assessed/performed      Past Medical History  Diagnosis Date  . Diabetes mellitus   . Hypertension   . GERD (gastroesophageal reflux disease)   . Neuropathy ([place])   . PONV (postoperative nausea and vomiting)   . High cholesterol   . Diabetic neuropathy ([place])   . Angina   . Myocardial infarction ([place]) 12/02/11    "they think I might have had a light heart attack this wk"  . Pneumonia   . Bronchitis   . Hypothyroidism   . History of stomach ulcers 1970's  . Arthritis     "in my hands"  . Depression   . S/P CABG (coronary artery bypass graft), 12/04/11 12/07/2011    LIMA to LAD, SVG to OM, SVG to RCA  . Acute renal failure, 01/03/12 01/03/2012  . Diverticulosis   . Hemorrhoids   . Esophageal stricture   . History of nuclear stress test 03/10/2007    dipyridamole; low risk scan   . Coronary artery disease     DR. HILTY IS PT'S CARDIOLOGIST  . Pain     RIGHT KNEE PAIN - TORN RIGHT MEDIAL MENISCUS  . Hiatal hernia     Past Surgical History  Procedure Laterality Date  . Fracture surgery      "put pins both side right ankle"  . Total knee arthroplasty  ~ 2006    left  . Joint replacement    . Back surgery  2006    "cyst growing near my spine"  . Tonsillectomy  1949  .  Abdominal hysterectomy  1980's  . Dilation and curettage of uterus      "a couple times"  . Cesarean section  1977  . Cataract extraction w/ intraocular lens  implant, bilateral  ~ 2010  . Cardiac catheterization  12/02/2011    mild LV dysfunction with mod hypocontractility of mid-distal anterolateral wall; CAD w/ostial tapering of L Main with 50% diffuse ostial narrowing of LAD, 99% eccentric focal prox LAD stenosis followed by 70% prox LAD stenosis after 1st diag, 20% mid LAD narrowing; 80% ostial-to-prox L Cfx stenosis & 40-50% irregularity of RCA (Dr. Corky Downs)  . Coronary artery bypass graft  12/04/2011    Procedure: CORONARY ARTERY BYPASS GRAFTING (CABG);  Surgeon: Tharon Aquas Adelene Idler, MD;  Location: [facility];  Service: Open Heart Surgery;  Laterality: N/A;  CABG x three,  using left internal mammary artery, and right leg greater saphenous vein harvested endoscopically  . Transthoracic echocardiogram  02/19/2013    EF 67-59%, grade 1 diastolic dysfunction; mildly thickend/calcified AV leaflets; mildly calcidied MV annulus; mild TR  . Knee arthroscopy with medial menisectomy Right 07/02/2014    Procedure: RIGHT KNEE ARTHROSCOPY WITH PARTIAL MEDIAL MENISTECTOMY,  ABRASION CONDROPLASTYU OF PATELLA,ABRASION CONDROPLASTY OF MEDIAL FEMEROL CONDYL, MICROFRACTURE OF MEDIAL FEMEROL CONDYL;  Surgeon: Tobi Bastos, MD;  Location: [facility];  Service: Orthopedics;  Laterality: Right;  . Left heart catheterization with coronary angiogram N/A 12/02/2011    Procedure: LEFT HEART CATHETERIZATION WITH CORONARY ANGIOGRAM;  Surgeon: Troy Sine, MD;  Location: [facility] C[facility] LAB;  Service: Cardiovascular;  Laterality: N/A;  Coronary angiogram, possible PCI    There were no vitals filed for this visit.  Visit Diagnosis:  Abnormality of gait  Generalized weakness      Subjective Assessment - 11/25/15 1159    Subjective The patient reports that her exercises are doing okay when she remembers them.  She reports she is not  getting dizziness with any activities except for rolling, which was provided for HEP.  She no longer gets nausea during the day.  She notes overall her memory is worsening.   Patient Stated Goals "I feel like I can manage this on my own, but I wish I could do something that would help the dizziness."   Currently in Pain? Yes   Pain Score 5    Pain Location Back   Pain Descriptors / Indicators Aching   Pain Onset More than a month ago   Pain Frequency Intermittent   Aggravating Factors  unsure   Pain Relieving Factors unsure         [facility] PT Assessment - 11/25/15 1058    Standardized Balance Assessment   Standardized Balance Assessment Berg Balance Test   Berg Balance Test   Sit to Stand Able to stand without using hands and stabilize independently   Standing Unsupported Able to stand safely 2 minutes   Sitting with Back Unsupported but Feet Supported on Floor or Stool Able to sit safely and securely 2 minutes   Stand to Sit Sits safely with minimal use of hands   Transfers Able to transfer safely, minor use of hands   Standing Unsupported with Eyes Closed Able to stand 10 seconds with supervision   Standing Ubsupported with Feet Together Able to place feet together independently and stand 1 minute safely   From Standing, Reach Forward with Outstretched Arm Can reach forward >12 cm safely (5")   From Standing Position, Pick up Object from Floor Able to pick up shoe safely and easily   From Standing Position, Turn to Look Behind Over each Shoulder Looks behind from both sides and weight shifts well   Turn 360 Degrees Able to turn 360 degrees safely in 4 seconds or less   Standing Unsupported, Alternately Place Feet on Step/Stool Able to complete 4 steps without aid or supervision   Standing Unsupported, One Foot in Front Able to take small step independently and hold 30 seconds   Standing on One Leg Tries to lift leg/unable to hold 3 seconds but remains standing independently   Total  Score 47   Berg comment: 47/56     Neuro: Berg=47/56 Reviewed balance HEP of corner standing feet apart with eyes closed, gaze x 1 viewing, narrowing base of support and LE ankle/heel toe raises. Pt needs cues for all HEP  Gait: Ambulation with cues on longer stride and arm swing during gait activities x 250 feet x 3 repetitions.  THERAPEUTIC ACTIVITIES: Floor<>stand transfers attempting without UE support on mat table, but too difficult.  Discussed ways to find obstacles outdoors (car bumper, using a curb, tree, etc) to help with UE support.       PT  Education - 11/26/15 1054    Education provided Yes   Education Details Reviewed all HEP   Person(s) Educated Patient   Methods Explanation;Demonstration   Comprehension Verbalized understanding;Returned demonstration;Tactile cues required;Verbal cues required;Need further instruction          PT Short Term Goals - 11/26/15 1055    PT SHORT TERM GOAL #1   Title The patient will be independent with HEP for habituation, gaze, balance and general mobility.   Baseline Target date 12/07/2015   Time 4   Period Weeks   Status On-going   PT SHORT TERM GOAL #2   Title The patient will improve Berg from 40/56 to > or equal to 45/56 to demo decreasing risk for falls.   Baseline Pt scored 47/56 on 11/25/2015.   Time 4   Period Weeks   Status Achieved   PT SHORT TERM GOAL #3   Title The patient will improve gait speed from 2.16 ft/sec to > or equal to 2.4 ft/sec to demo improving functional moblity.   Baseline Target date 12/07/2015   Time 4   Period Weeks   Status On-going   PT SHORT TERM GOAL #4   Title The patient will move floor<>stand with UE support due to h/o falls.   Baseline Patient demonstrates on 11/25/2015   Time 4   Period Weeks   Status Achieved           PT Long Term Goals - 11/06/15 1300    PT LONG TERM GOAL #1   Title The patient will reduce dizziness handicap index from 50% to < or equal to 35% to demo  decreased self perception of dizziness.   Baseline Target date 01/04/2016   Time 8   Period Weeks   PT LONG TERM GOAL #2   Title The patient will improve gait speed to > or equal to 2.62 ft/sec to demo transition to "full community ambulator" classification of gait.   Baseline Target date 01/04/2016   Time 8   Period Weeks   PT LONG TERM GOAL #3   Title The patient will improve Berg score to > or equal to 48/56 to demo decreased risk for falls.   Baseline Target date 01/04/2016   Time 8   Period Weeks   PT LONG TERM GOAL #4   Title The patient will perform gait with head turns without veering from midline.     Baseline Target date 01/04/2016   Time 8   Period Weeks               Plan - 11/26/15 1056    Clinical Impression Statement The patient met STG for Berg and floor transfers.  She appears to have diminished carryover of HEP possibly due to cognitive changes reporting she is unable to remember to perform regularly.   PT Next Visit Plan Check goals and d/c due to dec'd carryover, balance, gait.  Discuss dizziness.   Consulted and Agree with Plan of Care Patient        Problem List Patient Active Problem List   Diagnosis Date Noted  . Memory loss 03/14/2015  . Chest tightness 09/16/2014  . Osteoarthritis of right knee 07/02/2014  . Acute medial meniscus tear of right knee 07/02/2014  . Diabetic peripheral neuropathy associated with type 1 diabetes mellitus ([place]) 09/05/2013  . Edema of extremities 04/18/2013  . Cellulitis 04/18/2013  . HTN (hypertension) 01/06/2012  . Acute renal failure,admitted 01/03/12, after admission for diastolic chf 94/04/6807  .  Acute diastolic chf 3/1 to 06/29/99 12/27/2011  . PAF post CABG, NSR now 12/24/2011  . S/P CABG (coronary artery bypass graft), 12/04/11 12/07/2011  . Hyperlipidemia LDL goal <70 12/03/2011    Class: Diagnosis of  . NSTEMI (non-ST elevated myocardial infarction) ([place]) 12/01/2011  . Family history of early CAD 12/01/2011   . DIABETES MELLITUS-TYPE II 12/14/2010  . DYSPHAGIA UNSPECIFIED 12/14/2010  . ABDOMINAL PAIN, EPIGASTRIC 12/14/2010  . ESOPHAGEAL STRICTURE 11/28/2009  . GERD 11/28/2009  . CONSTIPATION 11/28/2009  . FLATULENCE-GAS-BLOATING 11/28/2009  . CONSTIPATION, CHRONIC 12/19/2007  . SALPINGO-OOPHORITIS 12/19/2007  . MENOPAUSE, SURGICAL 12/19/2007  . BACK PAIN, LUMBAR 12/19/2007  . ANKLE INJURY, RIGHT 12/19/2007  . TOTAL KNEE REPLACEMENT, LEFT, HX OF 12/19/2007    WEAVER,CHRISTINA, PT 11/26/2015, 10:57 AM  [facility] [address] [place], [place], [address] Phone: [phone]   Fax:  [phone]  Name: [name] MRN: [id number] Date of Birth: 06-Jul-1941

## 2015-12-01 ENCOUNTER — Ambulatory Visit: Payer: Medicare Other | Attending: Internal Medicine | Admitting: Rehabilitative and Restorative Service Providers"

## 2015-12-01 ENCOUNTER — Encounter: Payer: Medicare Other | Admitting: Rehabilitative and Restorative Service Providers"

## 2015-12-01 DIAGNOSIS — R269 Unspecified abnormalities of gait and mobility: Secondary | ICD-10-CM | POA: Insufficient documentation

## 2015-12-01 DIAGNOSIS — R531 Weakness: Secondary | ICD-10-CM | POA: Diagnosis present

## 2015-12-01 NOTE — Therapy (Signed)
[facility] [address] [place], [place], [address] Phone: [phone]   Fax:  [phone]  Patient Details  Name: [name] MRN: [id number] Date of Birth: [birth year] Referring Provider:  No ref. provider found  Encounter Date: 12/01/2015  PHYSICAL THERAPY DISCHARGE SUMMARY  Visits from Start of Care: 5  Current functional level related to goals / functional outcomes:     PT Short Term Goals - 12/01/15 1251    PT SHORT TERM GOAL #1   Title The patient will be independent with HEP for habituation, gaze, balance and general mobility.   Baseline Patient has difficulty remembering to perform HEP.   Time 4   Period Weeks   Status Partially Met   PT SHORT TERM GOAL #2   Title The patient will improve Berg from 40/56 to > or equal to 45/56 to demo decreasing risk for falls.   Baseline Pt scored 47/56 on 11/25/2015.   Time 4   Period Weeks   Status Achieved   PT SHORT TERM GOAL #3   Title The patient will improve gait speed from 2.16 ft/sec to > or equal to 2.4 ft/sec to demo improving functional moblity.   Baseline See LTG   Time 4   Period Weeks   Status Achieved   PT SHORT TERM GOAL #4   Title The patient will move floor<>stand with UE support due to h/o falls.   Baseline Patient demonstrates on 11/25/2015   Time 4   Period Weeks   Status Achieved         PT Long Term Goals - 12/01/15 1209    PT LONG TERM GOAL #1   Title The patient will reduce dizziness handicap index from 50% to < or equal to 35% to demo decreased self perception of dizziness.   Baseline Improved to 30% DHI   Time 8   Period Weeks   Status Achieved   PT LONG TERM GOAL #2   Title The patient will improve gait speed to > or equal to 2.62 ft/sec to demo transition to "full community ambulator" classification of gait.   Baseline Patient scores 2.92 ft/sec at discharge from PT   Time 8   Period Weeks   Status Achieved   PT LONG TERM  GOAL #3   Title The patient will improve Berg score to > or equal to 48/56 to demo decreased risk for falls.   Baseline Pt scores 50/56 on 12/01/2015   Time 8   Period Weeks   Status Achieved   PT LONG TERM GOAL #4   Title The patient will perform gait with head turns without veering from midline.     Baseline Patient does not veer from midline, but she does shuffle her feet more during dynamic gait activities.   Time 8   Period Weeks   Status Achieved        Remaining deficits: Decreased high level balance, decreased ability to multi-task Patient reports cognitive changes with memory   Education / Equipment: Home exercises, home safety, fall risk.  Plan: Patient agrees to discharge.  Patient goals were met. Patient is being discharged due to meeting the stated rehab goals.  ?????       Thank you for the referral of this patient. Rudell Cobb, MPT  WEAVER,CHRISTINA 12/01/2015, 1:01 PM  [facility] [address] [place], [place], [address] Phone: [phone]   Fax:  [phone]

## 2015-12-01 NOTE — Therapy (Signed)
[facility] [address] [place], [place], [address] Phone: [phone]   Fax:  [phone]  Physical Therapy Treatment  Patient Details  Name: [name] MRN: [id number] Date of Birth: 24-Jan-1941 Referring Provider: Delrae Rend, MD  Encounter Date: 12/01/2015      PT End of Session - 12/01/15 1218    Visit Number 5   Number of Visits 8   Date for PT Re-Evaluation 01/02/16   Authorization Type G code every 10th visit   PT Start Time 1158   PT Stop Time 1225   PT Time Calculation (min) 27 min   Equipment Utilized During Treatment Gait belt   Activity Tolerance Patient tolerated treatment well   Behavior During Therapy [facility] for tasks assessed/performed      Past Medical History  Diagnosis Date  . Diabetes mellitus   . Hypertension   . GERD (gastroesophageal reflux disease)   . Neuropathy ([place])   . PONV (postoperative nausea and vomiting)   . High cholesterol   . Diabetic neuropathy ([place])   . Angina   . Myocardial infarction ([place]) 12/02/11    "they think I might have had a light heart attack this wk"  . Pneumonia   . Bronchitis   . Hypothyroidism   . History of stomach ulcers 1970's  . Arthritis     "in my hands"  . Depression   . S/P CABG (coronary artery bypass graft), 12/04/11 12/07/2011    LIMA to LAD, SVG to OM, SVG to RCA  . Acute renal failure, 01/03/12 01/03/2012  . Diverticulosis   . Hemorrhoids   . Esophageal stricture   . History of nuclear stress test 03/10/2007    dipyridamole; low risk scan   . Coronary artery disease     DR. HILTY IS PT'S CARDIOLOGIST  . Pain     RIGHT KNEE PAIN - TORN RIGHT MEDIAL MENISCUS  . Hiatal hernia     Past Surgical History  Procedure Laterality Date  . Fracture surgery      "put pins both side right ankle"  . Total knee arthroplasty  ~ 2006    left  . Joint replacement    . Back surgery  2006    "cyst growing near my spine"  . Tonsillectomy  1949  .  Abdominal hysterectomy  1980's  . Dilation and curettage of uterus      "a couple times"  . Cesarean section  1977  . Cataract extraction w/ intraocular lens  implant, bilateral  ~ 2010  . Cardiac catheterization  12/02/2011    mild LV dysfunction with mod hypocontractility of mid-distal anterolateral wall; CAD w/ostial tapering of L Main with 50% diffuse ostial narrowing of LAD, 99% eccentric focal prox LAD stenosis followed by 70% prox LAD stenosis after 1st diag, 20% mid LAD narrowing; 80% ostial-to-prox L Cfx stenosis & 40-50% irregularity of RCA (Dr. Corky Downs)  . Coronary artery bypass graft  12/04/2011    Procedure: CORONARY ARTERY BYPASS GRAFTING (CABG);  Surgeon: Tharon Aquas Adelene Idler, MD;  Location: [facility];  Service: Open Heart Surgery;  Laterality: N/A;  CABG x three,  using left internal mammary artery, and right leg greater saphenous vein harvested endoscopically  . Transthoracic echocardiogram  02/19/2013    EF 66-44%, grade 1 diastolic dysfunction; mildly thickend/calcified AV leaflets; mildly calcidied MV annulus; mild TR  . Knee arthroscopy with medial menisectomy Right 07/02/2014    Procedure: RIGHT KNEE ARTHROSCOPY WITH PARTIAL MEDIAL MENISTECTOMY,  ABRASION CONDROPLASTYU OF PATELLA,ABRASION CONDROPLASTY OF MEDIAL FEMEROL CONDYL, MICROFRACTURE OF MEDIAL FEMEROL CONDYL;  Surgeon: Tobi Bastos, MD;  [facility];  Service: Orthopedics;  Laterality: Right;  . Left heart catheterization with coronary angiogram N/A 12/02/2011    Procedure: LEFT HEART CATHETERIZATION WITH CORONARY ANGIOGRAM;  Surgeon: Troy Sine, MD;  Location: [facility] C[facility] LAB;  Service: Cardiovascular;  Laterality: N/A;  Coronary angiogram, possible PCI    There were no vitals filed for this visit.  Visit Diagnosis:  Abnormality of gait  Generalized weakness      Subjective Assessment - 12/01/15 1205    Subjective The patient reports not getting much dizziness during the day.  She still notes occasional episodes  with bending forward.  She is still having difficulty remembering to complete HEP on a regular basis.  She denies falls.   Patient Stated Goals "I feel like I can manage this on my own, but I wish I could do something that would help the dizziness."   Currently in Pain? No/denies            [facility] PT Assessment - 12/01/15 1210    Standardized Balance Assessment   Standardized Balance Assessment Berg Balance Test   Berg Balance Test   Sit to Stand Able to stand without using hands and stabilize independently   Standing Unsupported Able to stand safely 2 minutes   Sitting with Back Unsupported but Feet Supported on Floor or Stool Able to sit safely and securely 2 minutes   Stand to Sit Sits safely with minimal use of hands   Transfers Able to transfer safely, minor use of hands   Standing Unsupported with Eyes Closed Able to stand 10 seconds safely   Standing Ubsupported with Feet Together Able to place feet together independently and stand 1 minute safely   From Standing, Reach Forward with Outstretched Arm Can reach forward >12 cm safely (5")   From Standing Position, Pick up Object from Floor Able to pick up shoe safely and easily   From Standing Position, Turn to Look Behind Over each Shoulder Looks behind from both sides and weight shifts well   Turn 360 Degrees Able to turn 360 degrees safely in 4 seconds or less   Standing Unsupported, Alternately Place Feet on Step/Stool Able to stand independently and safely and complete 8 steps in 20 seconds   Standing Unsupported, One Foot in Front Able to take small step independently and hold 30 seconds   Standing on One Leg Tries to lift leg/unable to hold 3 seconds but remains standing independently   Total Score 50   Berg comment: 50/56.      NEUROMUSCULAR RE-EDUCATION: Berg=50/56 Reviewed gaze x 1 viewing exercise seated Performed standing high level balance tasks of bending towards floor and return to stand without  dizziness.  Gait: Ambulation 45M=2.92 ft/sec Gait with horizontal head turns, gait with vertical head turns Discussed multi-tasking during gait, recommended limiting talking during walking.       PT Short Term Goals - 12/01/15 1251    PT SHORT TERM GOAL #1   Title The patient will be independent with HEP for habituation, gaze, balance and general mobility.   Baseline Patient has difficulty remembering to perform HEP.   Time 4   Period Weeks   Status Partially Met   PT SHORT TERM GOAL #2   Title The patient will improve Berg from 40/56 to > or equal to 45/56 to demo decreasing risk for falls.  Baseline Pt scored 47/56 on 11/25/2015.   Time 4   Period Weeks   Status Achieved   PT SHORT TERM GOAL #3   Title The patient will improve gait speed from 2.16 ft/sec to > or equal to 2.4 ft/sec to demo improving functional moblity.   Baseline See LTG   Time 4   Period Weeks   Status Achieved   PT SHORT TERM GOAL #4   Title The patient will move floor<>stand with UE support due to h/o falls.   Baseline Patient demonstrates on 11/25/2015   Time 4   Period Weeks   Status Achieved           PT Long Term Goals - 12/01/15 1209    PT LONG TERM GOAL #1   Title The patient will reduce dizziness handicap index from 50% to < or equal to 35% to demo decreased self perception of dizziness.   Baseline Improved to 30% DHI   Time 8   Period Weeks   Status Achieved   PT LONG TERM GOAL #2   Title The patient will improve gait speed to > or equal to 2.62 ft/sec to demo transition to "full community ambulator" classification of gait.   Baseline Patient scores 2.92 ft/sec at discharge from PT   Time 8   Period Weeks   Status Achieved   PT LONG TERM GOAL #3   Title The patient will improve Berg score to > or equal to 48/56 to demo decreased risk for falls.   Baseline Pt scores 50/56 on 12/01/2015   Time 8   Period Weeks   Status Achieved   PT LONG TERM GOAL #4   Title The patient will  perform gait with head turns without veering from midline.     Baseline Patient does not veer from midline, but she does shuffle her feet more during dynamic gait activities.   Time 8   Period Weeks   Status Achieved               Plan - 12/01/15 1252    Clinical Impression Statement The patient met LTGs for PT.  She has memory deficits that she feels hinder her ability to perform HEP.  PT and patient discussed highest fall risk is when multi-tasking during gait (walking outdoors, talking to friend).  Patient to continue HEP.   PT Next Visit Plan Discharge today.   Consulted and Agree with Plan of Care Patient        Problem List Patient Active Problem List   Diagnosis Date Noted  . Memory loss 03/14/2015  . Chest tightness 09/16/2014  . Osteoarthritis of right knee 07/02/2014  . Acute medial meniscus tear of right knee 07/02/2014  . Diabetic peripheral neuropathy associated with type 1 diabetes mellitus ([place]) 09/05/2013  . Edema of extremities 04/18/2013  . Cellulitis 04/18/2013  . HTN (hypertension) 01/06/2012  . Acute renal failure,admitted 01/03/12, after admission for diastolic chf 74/05/1447  . Acute diastolic chf 3/1 to 11/01/54 12/27/2011  . PAF post CABG, NSR now 12/24/2011  . S/P CABG (coronary artery bypass graft), 12/04/11 12/07/2011  . Hyperlipidemia LDL goal <70 12/03/2011    Class: Diagnosis of  . NSTEMI (non-ST elevated myocardial infarction) ([place]) 12/01/2011  . Family history of early CAD 12/01/2011  . DIABETES MELLITUS-TYPE II 12/14/2010  . DYSPHAGIA UNSPECIFIED 12/14/2010  . ABDOMINAL PAIN, EPIGASTRIC 12/14/2010  . ESOPHAGEAL STRICTURE 11/28/2009  . GERD 11/28/2009  . CONSTIPATION 11/28/2009  . FLATULENCE-GAS-BLOATING 11/28/2009  .  CONSTIPATION, CHRONIC 12/19/2007  . SALPINGO-OOPHORITIS 12/19/2007  . MENOPAUSE, SURGICAL 12/19/2007  . BACK PAIN, LUMBAR 12/19/2007  . ANKLE INJURY, RIGHT 12/19/2007  . TOTAL KNEE REPLACEMENT, LEFT, HX OF 12/19/2007     WEAVER,CHRISTINA, PT 12/01/2015, 12:55 PM  [facility] [address] [place], [place], [address] Phone: [phone]   Fax:  [phone]  Name: [name] MRN: [id number] Date of Birth: 11-09-1940

## 2016-01-07 ENCOUNTER — Ambulatory Visit: Payer: Medicare Other | Attending: Psychology | Admitting: Psychology

## 2016-01-07 ENCOUNTER — Telehealth: Payer: Self-pay | Admitting: Psychology

## 2016-01-07 DIAGNOSIS — G3184 Mild cognitive impairment, so stated: Secondary | ICD-10-CM | POA: Insufficient documentation

## 2016-01-07 DIAGNOSIS — F341 Dysthymic disorder: Secondary | ICD-10-CM | POA: Insufficient documentation

## 2016-01-07 NOTE — Telephone Encounter (Signed)
Thank you for the notification and I'm sorry about that.  I will ask my office to contact her as well

## 2016-01-07 NOTE — Telephone Encounter (Signed)
Spoke with patient. She states she has called their office and spoke with them about rescheduling.

## 2016-01-07 NOTE — Telephone Encounter (Signed)
[name] returned my call. She claimed no memory of having made an appointment and that she could not understand the automated appointment reminder message she received. I re-scheduled her for 04/08/16 but will contact her for a sooner appointment if I get a cancellation. I suggested that she write her appointment time on her calendar.

## 2016-01-07 NOTE — Telephone Encounter (Signed)
[name] did not show for her appointment today for neuropsychological evaluation. I left her a message to call me.

## 2016-01-23 ENCOUNTER — Ambulatory Visit (INDEPENDENT_AMBULATORY_CARE_PROVIDER_SITE_OTHER): Payer: Medicare Other | Admitting: Psychology

## 2016-01-23 DIAGNOSIS — G3184 Mild cognitive impairment, so stated: Secondary | ICD-10-CM

## 2016-01-23 DIAGNOSIS — F341 Dysthymic disorder: Secondary | ICD-10-CM
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
## 2016-02-13 ENCOUNTER — Encounter: Payer: Self-pay | Admitting: Internal Medicine

## 2016-02-13 ENCOUNTER — Ambulatory Visit (INDEPENDENT_AMBULATORY_CARE_PROVIDER_SITE_OTHER): Payer: Medicare Other | Admitting: Internal Medicine

## 2016-02-13 VITALS — BP 154/60 | HR 56 | Ht 60.0 in | Wt 141.0 lb

## 2016-02-13 DIAGNOSIS — I10 Essential (primary) hypertension: Secondary | ICD-10-CM

## 2016-02-13 DIAGNOSIS — I480 Paroxysmal atrial fibrillation: Secondary | ICD-10-CM | POA: Diagnosis not present

## 2016-02-13 DIAGNOSIS — Z951 Presence of aortocoronary bypass graft: Secondary | ICD-10-CM

## 2016-02-13 DIAGNOSIS — E785 Hyperlipidemia, unspecified: Secondary | ICD-10-CM | POA: Diagnosis not present

## 2016-02-13 NOTE — Progress Notes (Signed)
OFFICE NOTE  Chief Complaint:  Leg weakness  Primary Care Physician: Kelton Pillar, Herschell Dimes, MD  HPI:  [name] is a pleasant 75 year old female with history of coronary disease and CABG x 3 vessels in 2013, LIMA to LAD, SVG to OM and SVG to right coronary. She underwent an echocardiogram which demonstrates normal EF of 55% to 65%. However, there is stage 1 diastolic dysfunction and evidence for elevated mean left atrial filling pressure. There were mildly thickened calcified aortic valve leaflets but no stenosis and mild tricuspid regurgitation. At the time, I started her on Lasix 20 mg daily. A BMP was obtained that day which indicated mild elevation at 75.5, creatinine was 1.16. She has been taking the Lasix with improvement in her lower extremity swelling although shortness of breath seems to be about the same. Again, she notes it is only with marked exertion, not necessarily with normal activities. However, she does have ongoing fatigue and easy fatigability. This may be due to just poor exercise tolerance. She also reported that recently you started her on a new SLGT2 diabetic medicine and she said that she took several doses of that, which I believe was Invokana, and she became markedly dizzy with that. She has since stopped that medication. Again, overall the patient is fairly stable with a normal ejection fraction. She recently had bypass and is not having any anginal symptoms and therefore I do not suspect that this is an issue related to her bypass grafts.   I saw [name] today in the office for an acute visit regarding what is described as abdominal pain. She reports a numbness and pain sensation over the right upper quadrant. She's also noted some firmness and distention of her abdomen. This is recently presented to a GI doctor in [place] who thought she might have a gastric ulcer. He performed endoscopy which was a reportedly unrevealing. He also recommended she is take  MiraLAX for constipation. She says that recently she's been constipated but also has been having some small amount of loose stool. She did not want to take the MiraLAX given the loose stool. She also reports some of the discomfort extends across the mid epigastrium and into the lower chest. It is entirely atypical for cardiac chest pain. She reports that she had a colonoscopy in the past by Dr. Henrene Pastor about 5 years ago and a repeat colonoscopy was recommended around 2018. She also tells me that she recently had a right upper quadrant ultrasound performed at [facility] after presenting with abdominal pain which was negative for acute biliary disease.  [name] returns today for follow-up. Overall she seems to be doing well from a cardiac standpoint. I understand she is switched primary care providers to [facility] primary care, however she is unclear of her primary care provider. She does have an upcoming appointment to see Jackelyn Knife neurology. Recently she's been having trouble with forgetfulness and it seems to be getting worse. She tells me she lost her cell phone at home and has not been able to locate it. She denies any chest pain or shortness of breath and in fact her flank back and abdominal pain which was evaluated extensively has now gone away.  I had the pleasure of seeing [name] back today in follow-up. It's been 6 months and she'll last appointment. Her main concern today is leg weakness. She reports she has some back pain and associated weakness in her legs that give out from  time to time. She recently had memory testing indicating possible dementia as well as likely depression. It was recommended that she go on therapy however she declined medications. She denies any chest pain or worsening shortness of breath.  PMHx:  Past Medical History  Diagnosis Date  . Diabetes mellitus   . Hypertension   . GERD (gastroesophageal reflux disease)   . Neuropathy ([place])   . PONV  (postoperative nausea and vomiting)   . High cholesterol   . Diabetic neuropathy ([place])   . Angina   . Myocardial infarction ([place]) 12/02/11    "they think I might have had a light heart attack this wk"  . Pneumonia   . Bronchitis   . Hypothyroidism   . History of stomach ulcers 1970's  . Arthritis     "in my hands"  . Depression   . S/P CABG (coronary artery bypass graft), 12/04/11 12/07/2011    LIMA to LAD, SVG to OM, SVG to RCA  . Acute renal failure, 01/03/12 01/03/2012  . Diverticulosis   . Hemorrhoids   . Esophageal stricture   . History of nuclear stress test 03/10/2007    dipyridamole; low risk scan   . Coronary artery disease     DR. HILTY IS PT'S CARDIOLOGIST  . Pain     RIGHT KNEE PAIN - TORN RIGHT MEDIAL MENISCUS  . Hiatal hernia     Past Surgical History  Procedure Laterality Date  . Fracture surgery      "put pins both side right ankle"  . Total knee arthroplasty  ~ 2006    left  . Joint replacement    . Back surgery  2006    "cyst growing near my spine"  . Tonsillectomy  1949  . Abdominal hysterectomy  1980's  . Dilation and curettage of uterus      "a couple times"  . Cesarean section  1977  . Cataract extraction w/ intraocular lens  implant, bilateral  ~ 2010  . Cardiac catheterization  12/02/2011    mild LV dysfunction with mod hypocontractility of mid-distal anterolateral wall; CAD w/ostial tapering of L [place] with 50% diffuse ostial narrowing of LAD, 99% eccentric focal prox LAD stenosis followed by 70% prox LAD stenosis after 1st diag, 20% mid LAD narrowing; 80% ostial-to-prox L Cfx stenosis & 40-50% irregularity of RCA (Dr. Corky Downs)  . Coronary artery bypass graft  12/04/2011    Procedure: CORONARY ARTERY BYPASS GRAFTING (CABG);  Surgeon: Tharon Aquas Adelene Idler, MD;  Location: [facility];  Service: Open Heart Surgery;  Laterality: N/A;  CABG x three,  using left internal mammary artery, and right leg greater saphenous vein harvested endoscopically  . Transthoracic  echocardiogram  02/19/2013    EF 0000000, grade 1 diastolic dysfunction; mildly thickend/calcified AV leaflets; mildly calcidied MV annulus; mild TR  . Knee arthroscopy with medial menisectomy Right 07/02/2014    Procedure: RIGHT KNEE ARTHROSCOPY WITH PARTIAL MEDIAL MENISTECTOMY, ABRASION CONDROPLASTYU OF PATELLA,ABRASION CONDROPLASTY OF MEDIAL FEMEROL CONDYL, MICROFRACTURE OF MEDIAL FEMEROL CONDYL;  Surgeon: Tobi Bastos, MD;  Location: [facility];  Service: Orthopedics;  Laterality: Right;  . Left heart catheterization with coronary angiogram N/A 12/02/2011    Procedure: LEFT HEART CATHETERIZATION WITH CORONARY ANGIOGRAM;  Surgeon: Troy Sine, MD;  Location: [facility] CATH LAB;  Service: Cardiovascular;  Laterality: N/A;  Coronary angiogram, possible PCI    FAMHx:  Family History  Problem Relation Age of Onset  . Colon cancer Neg Hx   . Breast cancer  Sister     x 3  . Heart disease Father     also hyperlipidemia  . Heart disease Brother     x5; one with MI  . Heart disease Sister     x3  . Diabetes Mother     also, HTN & CVA  . Diabetes Sister     x3  . Lung cancer Sister   . Breast cancer Sister     x2    SOCHx:   reports that she has never smoked. She has never used smokeless tobacco. She reports that she drinks alcohol. She reports that she does not use illicit drugs.  ALLERGIES:  Allergies  Allergen Reactions  . Epinephrine Other (See Comments)    Abnormal feeling. Dental exam/injection of local w/ epi.  . Other     MANGO'S - WHELPS ALL OVER  . Sulfa Antibiotics     ROS: Pertinent items noted in HPI and remainder of comprehensive ROS otherwise negative.  HOME MEDS: Current Outpatient Prescriptions  Medication Sig Dispense Refill  . amLODipine (NORVASC) 10 MG tablet TAKE ONE TABLET BY MOUTH IN THE MORNING 30 tablet 5  . Cyanocobalamin (VITAMIN B 12 PO) Take 1 tablet by mouth daily.     Marland Kitchen esomeprazole (NEXIUM) 20 MG capsule Take 20 mg by mouth daily at 12 noon.    .  furosemide (LASIX) 20 MG tablet TAKE ONE TABLET BY MOUTH ONCE DAILY 30 tablet 8  . gabapentin (NEURONTIN) 300 MG capsule Take 300 mg by mouth 2 (two) times daily.     . insulin glargine (LANTUS) 100 UNIT/ML injection Inject 14 Units into the skin every morning.     Marland Kitchen levothyroxine (SYNTHROID, LEVOTHROID) 50 MCG tablet Take 50 mcg by mouth daily before breakfast.     . losartan (COZAAR) 50 MG tablet Take 50 mg by mouth every morning.     Marland Kitchen MAGNESIUM PO Take 1 tablet by mouth daily.     . meclizine (ANTIVERT) 25 MG tablet Take 25 mg by mouth 4 (four) times daily as needed. For dizziness    . Multiple Vitamins-Minerals (PRESERVISION AREDS 2 PO) Take 1 tablet by mouth daily.    . ONE TOUCH ULTRA TEST test strip daily.    Vladimir Faster Glycol-Propyl Glycol (SYSTANE OP) Apply 1 drop to eye 2 (two) times daily.    . sitaGLIPtin-metformin (JANUMET) 50-1000 MG per tablet Take 1 tablet by mouth 2 (two) times daily with a meal.    . sucralfate (CARAFATE) 1 G tablet Take 1 tablet (1 g total) by mouth 4 (four) times daily -  with meals and at bedtime. 120 tablet 0  . vitamin C (ASCORBIC ACID) 500 MG tablet Take 500 mg by mouth daily.    . vitamin E 400 UNIT capsule Take 400 Units by mouth daily. Reported on 11/06/2015     No current facility-administered medications for this visit.    LABS/IMAGING: No results found for this or any previous visit (from the past 48 hour(s)). No results found.  VITALS: BP 154/60 mmHg  Pulse 56  Ht 5' (1.524 m)  Wt 141 lb (63.957 kg)  BMI 27.54 kg/m2  EXAM: General appearance: alert and no distress Neck: no carotid bruit and no JVD Lungs: clear to auscultation bilaterally Heart: regular rate and rhythm, S1, S2 normal, no murmur, click, rub or gallop Abdomen: Protuberant, distended with mild to be need to percussion, no rebound and mild guarding in the right upper quadrant, negative Murphy sign Extremities:  extremities normal, atraumatic, no cyanosis or edema Pulses:  2+ and symmetric Skin: Bilateral erythema Neurologic: Grossly normal  EKG: Sinus bradycardia 56  ASSESSMENT: 1. Coronary artery disease status post CABG x 3 vessels in 2013 2. Diabetes type 2 - now on insulin 3. Hypertension 4. Dyslipidemia 5. Peripheral neuropathy - secondary to diabetes 6. Progressive memory loss 7. Low back pain and leg weakness  PLAN: 1.   [name] is stable from a cardiac standpoint. She seems to be mostly suffering with significant memory problems which is frustrating for her. She "doesn't want to have dementia" like some of her family members. She had workup by Dr. Carles Collet of neurology and has refused to go on any medications. I recommended taking daily aspirin at her last visit however she cannot recall doing that. This is recommended given her bypass grafts. She is complaining of some low back pain and leg weakness and at time she says her legs give out on her. She may need further evaluation of this. She does have a history of back surgery in the past. Follow-up with me annually or sooner as necessary.  [name], MD, [facility] Attending Cardiologist [place] 02/13/2016, 12:54 PM

## 2016-02-13 NOTE — Patient Instructions (Signed)
Your physician recommends that you schedule a follow-up appointment in 2 weeks with Erasmo Downer (clinical pharmacist) for a BLOOD PRESSURE CHECK >> please monitor your blood pressures at home twice daily - once before taking meds and then 2 hours after taking meds  Your physician wants you to follow-up in: 1 year with Dr. Debara Pickett. You will receive a reminder letter in the mail two months in advance. If you don't receive a letter, please call our office to schedule the follow-up appointment.

## 2016-02-26 ENCOUNTER — Ambulatory Visit (INDEPENDENT_AMBULATORY_CARE_PROVIDER_SITE_OTHER): Payer: Medicare Other | Admitting: Pharmacist Clinician (PhC)/ Clinical Pharmacy Specialist

## 2016-02-26 ENCOUNTER — Encounter: Payer: Self-pay | Admitting: Pharmacist Clinician (PhC)/ Clinical Pharmacy Specialist

## 2016-02-26 VITALS — BP 180/66 | HR 60 | Ht 60.0 in | Wt 138.2 lb

## 2016-02-26 DIAGNOSIS — I10 Essential (primary) hypertension: Secondary | ICD-10-CM | POA: Diagnosis not present

## 2016-02-26 MED ORDER — LOSARTAN POTASSIUM 100 MG PO TABS: 100.0000 mg | ORAL_TABLET | Freq: Every day | ORAL | Status: DC

## 2016-02-26 NOTE — Patient Instructions (Signed)
Return for a a follow up appointment in   Your blood pressure today is 180/66 (goal is <150/90 0 eventually to <140/90 because of diabetes)  Check your blood pressure at home daily and keep record of the readings.  Take your BP meds as follows: increase losartan to 100 mg once daily; switch amlodipine to evenings  Bring all of your meds, your BP cuff and your record of home blood pressures to your next appointment.  Exercise as you're able, try to walk approximately 30 minutes per day.  Keep salt intake to a minimum, especially watch canned and prepared boxed foods.  Eat more fresh fruits and vegetables and fewer canned items.  Avoid eating in fast food restaurants.    HOW TO TAKE YOUR BLOOD PRESSURE: . Rest 5 minutes before taking your blood pressure. .  Don't smoke or drink caffeinated beverages for at least 30 minutes before. . Take your blood pressure before (not after) you eat. . Sit comfortably with your back supported and both feet on the floor (don't cross your legs). . Elevate your arm to heart level on a table or a desk. . Use the proper sized cuff. It should fit smoothly and snugly around your bare upper arm. There should be enough room to slip a fingertip under the cuff. The bottom edge of the cuff should be 1 inch above the crease of the elbow. . Ideally, take 3 measurements at one sitting and record the average.

## 2016-02-26 NOTE — Progress Notes (Signed)
02/26/2016 [name] [birth year] [id number]   HPI:  [name] is a 75 y.o. female patient of Dr Debara Pickett, with a PMH below who presents today for hypertension clinic evaluation.  Her cardiac history is significant for CABG x 3 in 2013 as well as stage 1 diastolic dysfunction.  Her BP at appointment with Dr. Debara Pickett (2 weeks ago), was 154/60, however home readings have been significantly higher.  No changes were made to her medications at that time.  She also has recently seen a neurologist about possible dementia, but she is unwilling to pursue treatment for this.  Family Hx: mother with hypertension, multiple strokes, died at 31 with MI; father died at 64 with "cholesterol" problems; 12 siblings, 4 brothers all died young with "cholesterol" 3 sisters with CABG; other siblings with aneurysm, stroke and hypertension (all siblings now deceased)  Social Hx: no tobacco, rare alcohol, 1-2 cups of coffee per day  Diet:  Lives alone, so does not prepare full meals, but grazes; does not eat fast foods, nor does she add salt to her foods  Exercise: none due to chronic leg and back pain/weakness  Home BP readings: has 30 readings over past 3 weeks; only one at 138/64; all others 155 or greater systolic.  All diastolic WNL  Current antihypertensive medications: amlodipine 10 mg qam, losartan 50 mg qam, f urosemide 20 mg most days   Wt Readings from Last 3 Encounters:  02/26/16 138 lb 3.2 oz (62.687 kg)  02/13/16 141 lb (63.957 kg)  10/28/15 141 lb (63.957 kg)   BP Readings from Last 3 Encounters:  02/26/16 180/66  02/13/16 154/60  10/28/15 112/60   Pulse Readings from Last 3 Encounters:  02/26/16 60  02/13/16 56  10/28/15 62    Current Outpatient Prescriptions  Medication Sig Dispense Refill  . amLODipine (NORVASC) 10 MG tablet TAKE ONE TABLET BY MOUTH IN THE MORNING 30 tablet 5  . Cyanocobalamin (VITAMIN B 12 PO) Take 1 tablet by mouth daily.     Marland Kitchen esomeprazole (NEXIUM) 20 MG  capsule Take 20 mg by mouth daily at 12 noon.    . furosemide (LASIX) 20 MG tablet TAKE ONE TABLET BY MOUTH ONCE DAILY 30 tablet 8  . gabapentin (NEURONTIN) 300 MG capsule Take 300 mg by mouth 2 (two) times daily.     . insulin glargine (LANTUS) 100 UNIT/ML injection Inject 14 Units into the skin every morning.     Marland Kitchen levothyroxine (SYNTHROID, LEVOTHROID) 50 MCG tablet Take 50 mcg by mouth daily before breakfast.     . losartan (COZAAR) 50 MG tablet Take 50 mg by mouth every morning.     Marland Kitchen MAGNESIUM PO Take 1 tablet by mouth daily.     . meclizine (ANTIVERT) 25 MG tablet Take 25 mg by mouth 4 (four) times daily as needed. For dizziness    . Multiple Vitamins-Minerals (PRESERVISION AREDS 2 PO) Take 1 tablet by mouth daily.    . ONE TOUCH ULTRA TEST test strip daily.    [name] Glycol-Propyl Glycol (SYSTANE OP) Apply 1 drop to eye 2 (two) times daily.    . sitaGLIPtin-metformin (JANUMET) 50-1000 MG per tablet Take 1 tablet by mouth 2 (two) times daily with a meal.    . sucralfate (CARAFATE) 1 G tablet Take 1 tablet (1 g total) by mouth 4 (four) times daily -  with meals and at bedtime. 120 tablet 0  . vitamin C (ASCORBIC ACID) 500 MG tablet Take  500 mg by mouth daily.    . vitamin E 400 UNIT capsule Take 400 Units by mouth daily. Reported on 11/06/2015     No current facility-administered medications for this visit.    Allergies  Allergen Reactions  . Epinephrine Other (See Comments)    Abnormal feeling. Dental exam/injection of local w/ epi.  . Other     MANGO'S - WHELPS ALL OVER  . Sulfa Antibiotics     Past Medical History  Diagnosis Date  . Diabetes mellitus   . Hypertension   . GERD (gastroesophageal reflux disease)   . Neuropathy ([place])   . PONV (postoperative nausea and vomiting)   . High cholesterol   . Diabetic neuropathy ([place])   . Angina   . Myocardial infarction ([place]) 12/02/11    "they think I might have had a light heart attack this wk"  . Pneumonia   . Bronchitis     . Hypothyroidism   . History of stomach ulcers 1970's  . Arthritis     "in my hands"  . Depression   . S/P CABG (coronary artery bypass graft), 12/04/11 12/07/2011    LIMA to LAD, SVG to OM, SVG to RCA  . Acute renal failure, 01/03/12 01/03/2012  . Diverticulosis   . Hemorrhoids   . Esophageal stricture   . History of nuclear stress test 03/10/2007    dipyridamole; low risk scan   . Coronary artery disease     DR. HILTY IS PT'S CARDIOLOGIST  . Pain     RIGHT KNEE PAIN - TORN RIGHT MEDIAL MENISCUS  . Hiatal hernia     Blood pressure 180/66, pulse 60, height 5' (1.524 m), weight 138 lb 3.2 oz (62.687 kg).    [name] PharmD CPP [facility]

## 2016-02-26 NOTE — Assessment & Plan Note (Signed)
Today her blood pressure is elevated at 180/66.  This is consistent with most of her home readings as well as her home cuff, which read 188/66.   Explained to patient that her goal BP is ideally < 140/90, as she is a diabetic.  She seems hesitant to take more medications, and does not seem to think that her current readings are far from normal.  I am going to have her increase the losartan to 100 mg each morning, and switch the amlodipine to evenings.  I will see her back in a month for follow up.  She will continue to check daily home blood pressure readings.

## 2016-03-05 ENCOUNTER — Other Ambulatory Visit: Payer: Self-pay | Admitting: Internal Medicine

## 2016-03-05 DIAGNOSIS — Z1231 Encounter for screening mammogram for malignant neoplasm of breast: Secondary | ICD-10-CM

## 2016-03-19 ENCOUNTER — Ambulatory Visit: Payer: Medicare Other

## 2016-03-23 ENCOUNTER — Ambulatory Visit
Admission: RE | Admit: 2016-03-23 | Discharge: 2016-03-23 | Disposition: A | Discharge status: Home / Self Care | Payer: Medicare Other | Source: Ambulatory Visit | Attending: Internal Medicine | Admitting: Internal Medicine

## 2016-03-23 DIAGNOSIS — Z1231 Encounter for screening mammogram for malignant neoplasm of breast: Secondary | ICD-10-CM

## 2016-03-25 ENCOUNTER — Ambulatory Visit: Payer: Medicare Other

## 2016-04-06 ENCOUNTER — Ambulatory Visit: Payer: Medicare Other

## 2016-04-08 ENCOUNTER — Encounter: Payer: Medicare Other | Admitting: Psychology

## 2016-04-13 ENCOUNTER — Ambulatory Visit (INDEPENDENT_AMBULATORY_CARE_PROVIDER_SITE_OTHER): Payer: Medicare Other | Admitting: Internal Medicine

## 2016-04-13 ENCOUNTER — Ambulatory Visit (INDEPENDENT_AMBULATORY_CARE_PROVIDER_SITE_OTHER): Payer: Medicare Other | Admitting: Pharmacist

## 2016-04-13 ENCOUNTER — Ambulatory Visit: Payer: Medicare Other | Admitting: Gastroenterology

## 2016-04-13 ENCOUNTER — Encounter: Payer: Self-pay | Admitting: Internal Medicine

## 2016-04-13 ENCOUNTER — Encounter: Payer: Self-pay | Admitting: Pharmacist

## 2016-04-13 VITALS — BP 184/60 | HR 60 | Ht 60.0 in | Wt 137.0 lb

## 2016-04-13 VITALS — BP 162/52 | Wt 138.0 lb

## 2016-04-13 DIAGNOSIS — R1031 Right lower quadrant pain: Secondary | ICD-10-CM

## 2016-04-13 DIAGNOSIS — K219 Gastro-esophageal reflux disease without esophagitis: Secondary | ICD-10-CM

## 2016-04-13 DIAGNOSIS — R197 Diarrhea, unspecified: Secondary | ICD-10-CM

## 2016-04-13 DIAGNOSIS — K5909 Other constipation: Secondary | ICD-10-CM

## 2016-04-13 DIAGNOSIS — I10 Essential (primary) hypertension: Secondary | ICD-10-CM

## 2016-04-13 MED ORDER — VALSARTAN 320 MG PO TABS: 320.0000 mg | ORAL_TABLET | Freq: Every day | ORAL | Status: DC

## 2016-04-13 MED ORDER — ESOMEPRAZOLE MAGNESIUM 20 MG PO CPDR: 20.0000 mg | DELAYED_RELEASE_CAPSULE | Freq: Every day | ORAL | Status: DC

## 2016-04-13 NOTE — Patient Instructions (Addendum)
Return for a a follow up appointment in 1 month  Check your blood pressure at home daily (if able) and keep record of the readings.  Take your BP meds as follows: Stop taking losartan  Start taking valsartan 320mg  once a day Start taking amlodipine in the evening    Bring all of your meds, your BP cuff and your record of home blood pressures to your next appointment.  Exercise as you're able, try to walk approximately 30 minutes per day.  Keep salt intake to a minimum, especially watch canned and prepared boxed foods.  Eat more fresh fruits and vegetables and fewer canned items.  Avoid eating in fast food restaurants.    HOW TO TAKE YOUR BLOOD PRESSURE: . Rest 5 minutes before taking your blood pressure. .  Don't smoke or drink caffeinated beverages for at least 30 minutes before. . Take your blood pressure before (not after) you eat. . Sit comfortably with your back supported and both feet on the floor (don't cross your legs). . Elevate your arm to heart level on a table or a desk. . Use the proper sized cuff. It should fit smoothly and snugly around your bare upper arm. There should be enough room to slip a fingertip under the cuff. The bottom edge of the cuff should be 1 inch above the crease of the elbow. . Ideally, take 3 measurements at one sitting and record the average.   Georgina Peer and Erasmo Downer - Pharmacist

## 2016-04-13 NOTE — Patient Instructions (Signed)
As discussed with Dr. Henrene Pastor, you may take Metamucil - 2 tablespoons in juice or water daily.  Do this for several weeks and continue if it helps.  If not, switch to Miralax daily

## 2016-04-13 NOTE — Progress Notes (Signed)
Patient ID: CAYTON MESSANO                 DOB: [birth year]                      MRN: [id number]     HPI: [name] is a 75 y.o. female patient of Dr Debara Pickett, with a PMH below who presents today for hypertension follow-up. Her cardiac history is significant for CABG x 3 in 2013 as well as stage 1 diastolic dysfunction. She also has recently seen a neurologist about possible dementia, but she is unwilling to pursue treatment for this.  At her last visit about a month ago with Erasmo Downer her losartan was increased to 100mg  daily.  Family Hx: mother with hypertension, multiple strokes, died at 34 with MI; father died at 85 with "cholesterol" problems; 12 siblings, 4 brothers all died young with "cholesterol" 3 sisters with CABG; other siblings with aneurysm, stroke and hypertension (all siblings now deceased)  Social Hx: no tobacco, rare alcohol, 1-2 cups of coffee per day  Diet: Lives alone, so does not prepare full meals, but grazes; does not eat fast foods, nor does she add salt to her foods  Exercise: none due to chronic leg and back pain/weakness  Home BP readings: [id number] with systolic [id number], most readings 160s/50-60s  Current antihypertensive medications: amlodipine 10 mg qam, losartan 100 mg qam, furosemide 20 mg most days  Wt Readings from Last 3 Encounters:  04/13/16 137 lb (62.143 kg)  02/26/16 138 lb 3.2 oz (62.687 kg)  02/13/16 141 lb (63.957 kg)   BP Readings from Last 3 Encounters:  04/13/16 184/60  02/26/16 180/66  02/13/16 154/60   Pulse Readings from Last 3 Encounters:  04/13/16 60  02/26/16 60  02/13/16 56    Renal function: CrCl cannot be calculated (Patient has no serum creatinine result on file.).  Past Medical History  Diagnosis Date  . Diabetes mellitus   . Hypertension   . GERD (gastroesophageal reflux disease)   . Neuropathy ([place])   . PONV (postoperative nausea and vomiting)   . High cholesterol   . Diabetic neuropathy ([place])   . Angina   .  Myocardial infarction ([place]) 12/02/11    "they think I might have had a light heart attack this wk"  . Pneumonia   . Bronchitis   . Hypothyroidism   . History of stomach ulcers 1970's  . Arthritis     "in my hands"  . Depression   . S/P CABG (coronary artery bypass graft), 12/04/11 12/07/2011    LIMA to LAD, SVG to OM, SVG to RCA  . Acute renal failure, 01/03/12 01/03/2012  . Diverticulosis   . Hemorrhoids   . Esophageal stricture   . History of nuclear stress test 03/10/2007    dipyridamole; low risk scan   . Coronary artery disease     DR. HILTY IS PT'S CARDIOLOGIST  . Pain     RIGHT KNEE PAIN - TORN RIGHT MEDIAL MENISCUS  . Hiatal hernia     Current Outpatient Prescriptions on File Prior to Visit  Medication Sig Dispense Refill  . amLODipine (NORVASC) 10 MG tablet TAKE ONE TABLET BY MOUTH IN THE MORNING 30 tablet 5  . Cyanocobalamin (VITAMIN B 12 PO) Take 1 tablet by mouth daily.     Marland Kitchen esomeprazole (NEXIUM) 20 MG capsule Take 1 capsule (20 mg total) by mouth daily at 12 noon. 30 capsule 3  . furosemide (LASIX)  20 MG tablet TAKE ONE TABLET BY MOUTH ONCE DAILY 30 tablet 8  . gabapentin (NEURONTIN) 300 MG capsule Take 300 mg by mouth 2 (two) times daily.     . insulin glargine (LANTUS) 100 UNIT/ML injection Inject 14 Units into the skin every morning.     Marland Kitchen levothyroxine (SYNTHROID, LEVOTHROID) 50 MCG tablet Take 50 mcg by mouth daily before breakfast.     . losartan (COZAAR) 100 MG tablet Take 1 tablet (100 mg total) by mouth daily. 30 tablet 5  . MAGNESIUM PO Take 1 tablet by mouth daily.     . meclizine (ANTIVERT) 25 MG tablet Take 25 mg by mouth 4 (four) times daily as needed. For dizziness    . Multiple Vitamins-Minerals (PRESERVISION AREDS 2 PO) Take 1 tablet by mouth daily.    . ONE TOUCH ULTRA TEST test strip daily.    [name] Glycol-Propyl Glycol (SYSTANE OP) Apply 1 drop to eye 2 (two) times daily.    . sitaGLIPtin-metformin (JANUMET) 50-1000 MG per tablet Take 1 tablet by  mouth 2 (two) times daily with a meal.    . vitamin C (ASCORBIC ACID) 500 MG tablet Take 500 mg by mouth daily.    . vitamin E 400 UNIT capsule Take 400 Units by mouth daily. Reported on 11/06/2015     No current facility-administered medications on file prior to visit.    Allergies  Allergen Reactions  . Epinephrine Other (See Comments)    Abnormal feeling. Dental exam/injection of local w/ epi.  . Other     MANGO'S - WHELPS ALL OVER  . Sulfa Antibiotics      Assessment/Plan: Hypertension: BP not at goal. Since patient is resistant to adding another medication will change her losartan for valsartan to see if we can get any added benefit with this agent. She will finish her current supply of losartan and then change to valsartan 320mg  once daily. Have again instructed her to take her amlodipine in the evening. Continue to monitor blood pressure and follow-up in Hypertension clinic in 1 month.   Thank you, Lelan Pons. [place], [place]

## 2016-04-13 NOTE — Progress Notes (Signed)
HISTORY OF PRESENT ILLNESS:  [name] is a 75 y.o. female with multiple medical problems as listed below. She has been followed in this office previously for erosive esophagitis with peptic stricture and colon cancer screening. I last saw the patient October 2013. She presents today, self referred, with chief complaint of constipation with occasional diarrhea and problems with right-sided abdominal pain last winter. The patient did see the GI physician assistant April 2016 for worsening reflux and some vague swallowing difficulties. She had been noncompliant with medical therapy which was reinstituted. Esophagram revealed no significant abnormalities. She tells me that last year she developed right-sided abdominal discomfort without identifiable exacerbating or relieving factors. She tells me that she saw multiple physicians including another gastroenterologist. Tells me that she could not give appointment in this office for 6 months. Reviewing the electronic medical record, the patient did have a CT scan of the abdomen and pelvis without contrast 06/06/2015 to evaluate right-sided abdominal pain and alternating bowel habits. No acute abnormality found. Constipation suggested. Patient has been long-standing diabetic. She is on multiple medications as listed. She is taking PPI with better control of GERD symptoms. She did want to discuss long-term PPI use as relates to possible osteoporosis. She is having no recurrent dysphagia. She reports no bowel movement for 3-4 days and may have an abrupt stool with diarrhea proximal once every 1-2 weeks. No incontinence. Last colonoscopy November 2008 negative except for mild diverticulosis and hemorrhoids. Last upper endoscopy 2012 with mild esophagitis. Her only other complaints are bloating and belching. Her weight has been stable. No bleeding.  REVIEW OF SYSTEMS:  All non-GI ROS negative except for arthritis, back pain, confusion, depression, fatigue, hearing  problems, ankle swelling, increased urination, urinary leakage, shortness of breath  Past Medical History  Diagnosis Date  . Diabetes mellitus   . Hypertension   . GERD (gastroesophageal reflux disease)   . Neuropathy ([place])   . PONV (postoperative nausea and vomiting)   . High cholesterol   . Diabetic neuropathy ([place])   . Angina   . Myocardial infarction ([place]) 12/02/11    "they think I might have had a light heart attack this wk"  . Pneumonia   . Bronchitis   . Hypothyroidism   . History of stomach ulcers 1970's  . Arthritis     "in my hands"  . Depression   . S/P CABG (coronary artery bypass graft), 12/04/11 12/07/2011    LIMA to LAD, SVG to OM, SVG to RCA  . Acute renal failure, 01/03/12 01/03/2012  . Diverticulosis   . Hemorrhoids   . Esophageal stricture   . History of nuclear stress test 03/10/2007    dipyridamole; low risk scan   . Coronary artery disease     DR. HILTY IS PT'S CARDIOLOGIST  . Pain     RIGHT KNEE PAIN - TORN RIGHT MEDIAL MENISCUS  . Hiatal hernia     Past Surgical History  Procedure Laterality Date  . Fracture surgery      "put pins both side right ankle"  . Total knee arthroplasty  ~ 2006    left  . Joint replacement    . Back surgery  2006    "cyst growing near my spine"  . Tonsillectomy  1949  . Abdominal hysterectomy  1980's  . Dilation and curettage of uterus      "a couple times"  . Cesarean section  1977  . Cataract extraction w/ intraocular lens  implant, bilateral  ~  2010  . Cardiac catheterization  12/02/2011    mild LV dysfunction with mod hypocontractility of mid-distal anterolateral wall; CAD w/ostial tapering of L [place] with 50% diffuse ostial narrowing of LAD, 99% eccentric focal prox LAD stenosis followed by 70% prox LAD stenosis after 1st diag, 20% mid LAD narrowing; 80% ostial-to-prox L Cfx stenosis & 40-50% irregularity of RCA (Dr. Corky Downs)  . Coronary artery bypass graft  12/04/2011    Procedure: CORONARY ARTERY BYPASS GRAFTING  (CABG);  Surgeon: Tharon Aquas Adelene Idler, MD;  Location: [facility];  Service: Open Heart Surgery;  Laterality: N/A;  CABG x three,  using left internal mammary artery, and right leg greater saphenous vein harvested endoscopically  . Transthoracic echocardiogram  02/19/2013    EF 0000000, grade 1 diastolic dysfunction; mildly thickend/calcified AV leaflets; mildly calcidied MV annulus; mild TR  . Knee arthroscopy with medial menisectomy Right 07/02/2014    Procedure: RIGHT KNEE ARTHROSCOPY WITH PARTIAL MEDIAL MENISTECTOMY, ABRASION CONDROPLASTYU OF PATELLA,ABRASION CONDROPLASTY OF MEDIAL FEMEROL CONDYL, MICROFRACTURE OF MEDIAL FEMEROL CONDYL;  Surgeon: Tobi Bastos, MD;  Location: [facility];  Service: Orthopedics;  Laterality: Right;  . Left heart catheterization with coronary angiogram N/A 12/02/2011    Procedure: LEFT HEART CATHETERIZATION WITH CORONARY ANGIOGRAM;  Surgeon: Troy Sine, MD;  Location: [facility] CATH LAB;  Service: Cardiovascular;  Laterality: N/A;  Coronary angiogram, possible PCI    Social History UMEKA Walker  reports that she has never smoked. She has never used smokeless tobacco. She reports that she drinks alcohol. She reports that she does not use illicit drugs.  family history includes Breast cancer in her sister and sister; Diabetes in her mother and sister; Heart disease in her brother, father, and sister; Lung cancer in her sister. There is no history of Colon cancer.  Allergies  Allergen Reactions  . Epinephrine Other (See Comments)    Abnormal feeling. Dental exam/injection of local w/ epi.  . Other     MANGO'S - WHELPS ALL OVER  . Sulfa Antibiotics        PHYSICAL EXAMINATION: Vital signs: BP 184/60 mmHg  Pulse 60  Ht 5' (1.524 m)  Wt 137 lb (62.143 kg)  BMI 26.76 kg/m2  Constitutional: generally well-appearing, no acute distress Psychiatric: alert and oriented x3, cooperative Eyes: extraocular movements intact, anicteric, conjunctiva pink Mouth: oral pharynx  moist, no lesions Neck: suppleWithout thyromegaly Lymph no lymphadenopathy Cardiovascular: heart regular rate and rhythm, no murmur Lungs: clear to auscultation bilaterally Abdomen: soft, nontender, nondistended, no obvious ascites, no peritoneal signs, normal bowel sounds, no organomegaly Rectal: Omitted Extremities: no clubbing cyanosis or lower extremity edema bilaterally Skin: no lesions on visible extremities Neuro: No focal deficits. Cranial nerves intact  ASSESSMENT:  #1. Constipation predominant irritable bowel occasional loose stools. May have bowel irregularity secondary to her medications and long-standing diabetes as well. No alarm features. Colonoscopy 2008 as described #2. GERD. Better control with PPI compliance #3. Esophageal stricture. No dysphagia #4. Remote problems with right-sided abdominal discomfort. Negative workup. Apparently saw multiple physicians, by her report. Has resolved.   PLAN:  #1. Recommend Metamucil 1-2 tablespoons daily. Try this for several weeks. If this helps, continue. #2. If Metamucil unhelpful, then change to MiraLAX daily. Titrated to need  #3. Reflux precautions #4. Continue PPI therapy to control GERD symptoms #5. Discussion today on long-term PPI use and potential side effects. Also discussed current knowledge regarding bone density issues #6. Resume general medical care with PCP. GI follow-up as needed  25 minutes was spent face-to-face with the patient. Greater than 50% of the time use for counseling regarding her multiple GI complaints and questions

## 2016-04-26 ENCOUNTER — Ambulatory Visit (INDEPENDENT_AMBULATORY_CARE_PROVIDER_SITE_OTHER): Payer: Medicare Other | Admitting: Neurology

## 2016-04-26 ENCOUNTER — Encounter: Payer: Self-pay | Admitting: Neurology

## 2016-04-26 VITALS — BP 134/76 | HR 52 | Ht 60.0 in | Wt 138.0 lb

## 2016-04-26 DIAGNOSIS — R0989 Other specified symptoms and signs involving the circulatory and respiratory systems: Secondary | ICD-10-CM | POA: Diagnosis not present

## 2016-04-26 DIAGNOSIS — R413 Other amnesia: Secondary | ICD-10-CM

## 2016-04-26 DIAGNOSIS — I6523 Occlusion and stenosis of bilateral carotid arteries: Secondary | ICD-10-CM
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
## 2016-05-11 ENCOUNTER — Ambulatory Visit (INDEPENDENT_AMBULATORY_CARE_PROVIDER_SITE_OTHER): Payer: Medicare Other | Admitting: Pharmacist Clinician (PhC)/ Clinical Pharmacy Specialist

## 2016-05-11 ENCOUNTER — Encounter: Payer: Self-pay | Admitting: Pharmacist Clinician (PhC)/ Clinical Pharmacy Specialist

## 2016-05-11 VITALS — BP 148/68 | HR 68 | Wt 137.2 lb

## 2016-05-11 DIAGNOSIS — I10 Essential (primary) hypertension: Secondary | ICD-10-CM

## 2016-05-11 NOTE — Assessment & Plan Note (Signed)
Today her BP in the office is improved to 148/68.  Since she has been on the valsartan for less than a week I suspect that her pressure will continue to drop a little more.  She is easily stressed and I believe this is also contributing to some of her high home readings.  She will continue with current medications and follow up with [place] in 3 months.

## 2016-05-11 NOTE — Progress Notes (Signed)
Patient ID: [name]                 DOB: 03/26/1941                      MRN: [id number]     HPI: [name] is a 75 y.o. female patient of Dr Debara Pickett, with a PMH below who presents today for hypertension follow-up. Her cardiac history is significant for CABG x 3 in 2013 as well as stage 1 diastolic dysfunction. She also has recently seen a neurologist about possible dementia, but she is unwilling to pursue treatment for this.  She has been adamant about not adding more medications, and has been concerned because of the switch to valsartan.  She worries that 320 mg is "so much more" than then 100 mg of losartan, and it's "awfully strong".  She ran out of the losartan about 1 week ago and switched, although I don't know that she actually is taking a whole valsartan 320 mg tablet each day.    She complains of dizziness/vertigo on occasion, but does have some inner ear issues, so cannot determine if her BP meds are actually the cause of this.   Family Hx: mother with hypertension, multiple strokes, died at 45 with MI; father died at 5 with "cholesterol" problems; 12 siblings, 4 brothers all died young with "cholesterol" 3 sisters with CABG; other siblings with aneurysm, stroke and hypertension (all siblings now deceased)  Social Hx: no tobacco, rare alcohol, 1-2 cups of coffee per day  Diet: Lives alone, so does not prepare full meals, but grazes; does not eat fast foods, nor does she add salt to her foods  Exercise: none due to chronic leg and back pain/weakness  Home BP readings: 99991111 with systolic >/= 99991111,  Average systolic 0000000, diastolic all WNL.    Current antihypertensive medications: amlodipine 10 mg qpm, valsartan 320 mg qam, furosemide 20 mg most days  Wt Readings from Last 3 Encounters:  05/11/16 137 lb 3.2 oz (62.234 kg)  04/26/16 138 lb (62.596 kg)  04/13/16 138 lb (62.596 kg)   BP Readings from Last 3 Encounters:  05/11/16 148/68  04/26/16 134/76  04/13/16 162/52    Pulse Readings from Last 3 Encounters:  05/11/16 68  04/26/16 52  04/13/16 60    Renal function: CrCl cannot be calculated (Patient has no serum creatinine result on file.).  Past Medical History  Diagnosis Date  . Diabetes mellitus   . Hypertension   . GERD (gastroesophageal reflux disease)   . Neuropathy ([place])   . PONV (postoperative nausea and vomiting)   . High cholesterol   . Diabetic neuropathy ([place])   . Angina   . Myocardial infarction ([address]) 12/02/11    "they think I might have had a light heart attack this wk"  . Pneumonia   . Bronchitis   . Hypothyroidism   . History of stomach ulcers 1970's  . Arthritis     "in my hands"  . Depression   . S/P CABG (coronary artery bypass graft), 12/04/11 12/07/2011    LIMA to LAD, SVG to OM, SVG to RCA  . Acute renal failure, 01/03/12 01/03/2012  . Diverticulosis   . Hemorrhoids   . Esophageal stricture   . History of nuclear stress test 03/10/2007    dipyridamole; low risk scan   . Coronary artery disease     DR. HILTY IS PT'S CARDIOLOGIST  . Pain  RIGHT KNEE PAIN - TORN RIGHT MEDIAL MENISCUS  . Hiatal hernia     Current Outpatient Prescriptions on File Prior to Visit  Medication Sig Dispense Refill  . amLODipine (NORVASC) 10 MG tablet TAKE ONE TABLET BY MOUTH IN THE MORNING 30 tablet 5  . Cyanocobalamin (VITAMIN B 12 PO) Take 1 tablet by mouth daily.     Marland Kitchen esomeprazole (NEXIUM) 20 MG capsule Take 1 capsule (20 mg total) by mouth daily at 12 noon. 30 capsule 3  . furosemide (LASIX) 20 MG tablet TAKE ONE TABLET BY MOUTH ONCE DAILY 30 tablet 8  . gabapentin (NEURONTIN) 300 MG capsule Take 300 mg by mouth 2 (two) times daily.     . insulin glargine (LANTUS) 100 UNIT/ML injection Inject 14 Units into the skin every morning.     Marland Kitchen levothyroxine (SYNTHROID, LEVOTHROID) 50 MCG tablet Take 50 mcg by mouth daily before breakfast.     . losartan (COZAAR) 100 MG tablet     . MAGNESIUM PO Take 1 tablet by mouth daily.     .  meclizine (ANTIVERT) 25 MG tablet Take 25 mg by mouth 4 (four) times daily as needed. For dizziness    . Multiple Vitamins-Minerals (PRESERVISION AREDS 2 PO) Take 1 tablet by mouth daily.    . ONE TOUCH ULTRA TEST test strip daily.    Vladimir Faster Glycol-Propyl Glycol (SYSTANE OP) Apply 1 drop to eye 2 (two) times daily.    . sitaGLIPtin-metformin (JANUMET) 50-1000 MG per tablet Take 1 tablet by mouth 2 (two) times daily with a meal.    . valsartan (DIOVAN) 320 MG tablet Take 1 tablet (320 mg total) by mouth daily. (Patient not taking: Reported on 04/26/2016) 30 tablet 3  . vitamin C (ASCORBIC ACID) 500 MG tablet Take 500 mg by mouth daily.    . vitamin E 400 UNIT capsule Take 400 Units by mouth daily. Reported on 11/06/2015     No current facility-administered medications on file prior to visit.    Allergies  Allergen Reactions  . Epinephrine Other (See Comments)    Abnormal feeling. Dental exam/injection of local w/ epi.  . Other     MANGO'S - WHELPS ALL OVER  . Sulfa Antibiotics      Thank you, Tommy Medal, [place]

## 2016-05-11 NOTE — Patient Instructions (Signed)
Return for a a follow up appointment in 3 months  Your blood pressure today is 148/68  (goal is < 150/90)   Check your blood pressure at home several times each week and keep record of the readings.  Take your BP meds as follows:  AM:  Valsartan 320 mg  PM: amlodipine 10 mg  Bring all of your meds, your BP cuff and your record of home blood pressures to your next appointment.  Exercise as you're able, try to walk approximately 30 minutes per day.  Keep salt intake to a minimum, especially watch canned and prepared boxed foods.  Eat more fresh fruits and vegetables and fewer canned items.  Avoid eating in fast food restaurants.    HOW TO TAKE YOUR BLOOD PRESSURE: . Rest 5 minutes before taking your blood pressure. .  Don't smoke or drink caffeinated beverages for at least 30 minutes before. . Take your blood pressure before (not after) you eat. . Sit comfortably with your back supported and both feet on the floor (don't cross your legs). . Elevate your arm to heart level on a table or a desk. . Use the proper sized cuff. It should fit smoothly and snugly around your bare upper arm. There should be enough room to slip a fingertip under the cuff. The bottom edge of the cuff should be 1 inch above the crease of the elbow. . Ideally, take 3 measurements at one sitting and record the average.

## 2016-05-22 ENCOUNTER — Other Ambulatory Visit: Payer: Self-pay | Admitting: Internal Medicine

## 2016-05-23 ENCOUNTER — Encounter (HOSPITAL_COMMUNITY): Payer: Self-pay | Admitting: Emergency Medicine

## 2016-05-23 ENCOUNTER — Emergency Department (HOSPITAL_COMMUNITY): Payer: Medicare Other

## 2016-05-23 ENCOUNTER — Observation Stay (HOSPITAL_COMMUNITY)
Admission: EM | Admit: 2016-05-23 | Discharge: 2016-05-25 | Disposition: A | Discharge status: Home / Self Care | Payer: Medicare Other | Attending: Internal Medicine | Admitting: Internal Medicine

## 2016-05-23 DIAGNOSIS — Z951 Presence of aortocoronary bypass graft: Secondary | ICD-10-CM | POA: Diagnosis not present

## 2016-05-23 DIAGNOSIS — Z7982 Long term (current) use of aspirin: Secondary | ICD-10-CM | POA: Insufficient documentation

## 2016-05-23 DIAGNOSIS — R0602 Shortness of breath: Secondary | ICD-10-CM | POA: Diagnosis present

## 2016-05-23 DIAGNOSIS — D649 Anemia, unspecified: Secondary | ICD-10-CM

## 2016-05-23 DIAGNOSIS — E1159 Type 2 diabetes mellitus with other circulatory complications: Secondary | ICD-10-CM | POA: Diagnosis present

## 2016-05-23 DIAGNOSIS — D638 Anemia in other chronic diseases classified elsewhere: Secondary | ICD-10-CM

## 2016-05-23 DIAGNOSIS — D631 Anemia in chronic kidney disease: Secondary | ICD-10-CM | POA: Insufficient documentation

## 2016-05-23 DIAGNOSIS — I252 Old myocardial infarction: Secondary | ICD-10-CM | POA: Diagnosis not present

## 2016-05-23 DIAGNOSIS — E1042 Type 1 diabetes mellitus with diabetic polyneuropathy: Secondary | ICD-10-CM | POA: Diagnosis not present

## 2016-05-23 DIAGNOSIS — E875 Hyperkalemia: Secondary | ICD-10-CM | POA: Diagnosis not present

## 2016-05-23 DIAGNOSIS — I2510 Atherosclerotic heart disease of native coronary artery without angina pectoris: Secondary | ICD-10-CM | POA: Diagnosis not present

## 2016-05-23 DIAGNOSIS — E039 Hypothyroidism, unspecified: Secondary | ICD-10-CM | POA: Diagnosis not present

## 2016-05-23 DIAGNOSIS — E1021 Type 1 diabetes mellitus with diabetic nephropathy: Secondary | ICD-10-CM | POA: Insufficient documentation

## 2016-05-23 DIAGNOSIS — Z955 Presence of coronary angioplasty implant and graft: Secondary | ICD-10-CM | POA: Insufficient documentation

## 2016-05-23 DIAGNOSIS — R0600 Dyspnea, unspecified: Secondary | ICD-10-CM

## 2016-05-23 DIAGNOSIS — E038 Other specified hypothyroidism: Secondary | ICD-10-CM

## 2016-05-23 DIAGNOSIS — I10 Essential (primary) hypertension: Secondary | ICD-10-CM | POA: Diagnosis not present

## 2016-05-23 DIAGNOSIS — K219 Gastro-esophageal reflux disease without esophagitis: Secondary | ICD-10-CM | POA: Diagnosis not present

## 2016-05-23 DIAGNOSIS — R531 Weakness: Secondary | ICD-10-CM

## 2016-05-23 DIAGNOSIS — Z794 Long term (current) use of insulin: Secondary | ICD-10-CM | POA: Insufficient documentation

## 2016-05-23 DIAGNOSIS — N179 Acute kidney failure, unspecified: Secondary | ICD-10-CM | POA: Insufficient documentation

## 2016-05-23 DIAGNOSIS — R0789 Other chest pain: Principal | ICD-10-CM | POA: Insufficient documentation

## 2016-05-23 DIAGNOSIS — E1169 Type 2 diabetes mellitus with other specified complication: Secondary | ICD-10-CM | POA: Diagnosis present

## 2016-05-23 DIAGNOSIS — Z96652 Presence of left artificial knee joint: Secondary | ICD-10-CM | POA: Diagnosis not present

## 2016-05-23 DIAGNOSIS — R262 Difficulty in walking, not elsewhere classified: Secondary | ICD-10-CM | POA: Diagnosis not present

## 2016-05-23 DIAGNOSIS — R079 Chest pain, unspecified: Secondary | ICD-10-CM

## 2016-05-23 DIAGNOSIS — E782 Mixed hyperlipidemia: Secondary | ICD-10-CM | POA: Insufficient documentation

## 2016-05-23 DIAGNOSIS — E785 Hyperlipidemia, unspecified: Secondary | ICD-10-CM | POA: Diagnosis present

## 2016-05-23 DIAGNOSIS — N183 Chronic kidney disease, stage 3 (moderate): Secondary | ICD-10-CM | POA: Diagnosis not present

## 2016-05-23 DIAGNOSIS — I152 Hypertension secondary to endocrine disorders: Secondary | ICD-10-CM | POA: Diagnosis present

## 2016-05-23 DIAGNOSIS — Z79899 Other long term (current) drug therapy: Secondary | ICD-10-CM | POA: Diagnosis not present

## 2016-05-23 DIAGNOSIS — E1022 Type 1 diabetes mellitus with diabetic chronic kidney disease: Secondary | ICD-10-CM | POA: Diagnosis not present

## 2016-05-23 DIAGNOSIS — I130 Hypertensive heart and chronic kidney disease with heart failure and stage 1 through stage 4 chronic kidney disease, or unspecified chronic kidney disease: Secondary | ICD-10-CM | POA: Insufficient documentation

## 2016-05-23 DIAGNOSIS — R55 Syncope and collapse: Secondary | ICD-10-CM

## 2016-05-23 DIAGNOSIS — N289 Disorder of kidney and ureter, unspecified: Secondary | ICD-10-CM

## 2016-05-23 LAB — BASIC METABOLIC PANEL
Anion gap: 11 (ref 5–15)
BUN: 28 mg/dL — ABNORMAL HIGH (ref 6–20)
CO2: 18 mmol/L — ABNORMAL LOW (ref 22–32)
Calcium: 9 mg/dL (ref 8.9–10.3)
Chloride: 108 mmol/L (ref 101–111)
Creatinine, Ser: 1.42 mg/dL — ABNORMAL HIGH (ref 0.44–1.00)
GFR calc Af Amer: 40 mL/min — ABNORMAL LOW (ref >60)
GFR calc non Af Amer: 35 mL/min — ABNORMAL LOW (ref >60)
Glucose, Bld: 192 mg/dL — ABNORMAL HIGH (ref 65–99)
Potassium: 5.6 mmol/L — ABNORMAL HIGH (ref 3.5–5.1)
Sodium: 137 mmol/L (ref 135–145)

## 2016-05-23 LAB — CBC
HCT: 32.4 % — ABNORMAL LOW (ref 36.0–46.0)
Hemoglobin: 10.6 g/dL — ABNORMAL LOW (ref 12.0–15.0)
MCH: 31.3 pg (ref 26.0–34.0)
MCHC: 32.7 g/dL (ref 30.0–36.0)
MCV: 95.6 fL (ref 78.0–100.0)
Platelets: 193 10*3/uL (ref 150–400)
RBC: 3.39 MIL/uL — ABNORMAL LOW (ref 3.87–5.11)
RDW: 13 % (ref 11.5–15.5)
WBC: 7.2 10*3/uL (ref 4.0–10.5)

## 2016-05-23 LAB — BRAIN NATRIURETIC PEPTIDE: B Natriuretic Peptide: 359.7 pg/mL — ABNORMAL HIGH (ref 0.0–100.0)

## 2016-05-23 LAB — D-DIMER, QUANTITATIVE: D-Dimer, Quant: 2.39 ug/mL-FEU — ABNORMAL HIGH (ref 0.00–0.50)

## 2016-05-23 LAB — I-STAT TROPONIN, ED: Troponin i, poc: 0.01 ng/mL (ref 0.00–0.08)

## 2016-05-23 LAB — GLUCOSE, CAPILLARY: Glucose-Capillary: 105 mg/dL — ABNORMAL HIGH (ref 65–99)

## 2016-05-23 LAB — POTASSIUM: Potassium: 5.9 mmol/L — ABNORMAL HIGH (ref 3.5–5.1)

## 2016-05-23 MED ORDER — AMLODIPINE BESYLATE 10 MG PO TABS
10.0000 mg | ORAL_TABLET | Freq: Every morning | ORAL | Status: DC
  Administered 2016-05-24 – 2016-05-25 (×2): 10 mg via ORAL
  Filled 2016-05-23 (×2): qty 1

## 2016-05-23 MED ORDER — MECLIZINE HCL 25 MG PO TABS
25.0000 mg | ORAL_TABLET | Freq: Every day | ORAL | Status: DC | PRN
Start: 2016-05-23 — End: 2016-05-25

## 2016-05-23 MED ORDER — SODIUM CHLORIDE 0.9% FLUSH
3.0000 mL | Freq: Two times a day (BID) | INTRAVENOUS | Status: DC
  Administered 2016-05-24 – 2016-05-25 (×3): 3 mL via INTRAVENOUS

## 2016-05-23 MED ORDER — SENNA 8.6 MG PO TABS
1.0000 | ORAL_TABLET | Freq: Two times a day (BID) | ORAL | Status: DC
  Administered 2016-05-23 – 2016-05-25 (×4): 8.6 mg via ORAL
  Filled 2016-05-23 (×4): qty 1

## 2016-05-23 MED ORDER — IRBESARTAN 300 MG PO TABS: 300.0000 mg | ORAL_TABLET | Freq: Every day | ORAL | Status: DC

## 2016-05-23 MED ORDER — ACETAMINOPHEN 650 MG RE SUPP: 650.0000 mg | Freq: Four times a day (QID) | RECTAL | Status: DC | PRN

## 2016-05-23 MED ORDER — SODIUM CHLORIDE 0.9 % IV SOLN
INTRAVENOUS | Status: DC
  Administered 2016-05-23: 18:00:00 via INTRAVENOUS

## 2016-05-23 MED ORDER — INSULIN ASPART 100 UNIT/ML ~~LOC~~ SOLN
0.0000 [IU] | Freq: Three times a day (TID) | SUBCUTANEOUS | Status: DC
  Administered 2016-05-24 – 2016-05-25 (×5): 1 [IU] via SUBCUTANEOUS

## 2016-05-23 MED ORDER — GI COCKTAIL ~~LOC~~
30.0000 mL | Freq: Once | ORAL | Status: AC
  Administered 2016-05-23: 30 mL via ORAL
  Filled 2016-05-23: qty 30

## 2016-05-23 MED ORDER — GABAPENTIN 300 MG PO CAPS
300.0000 mg | ORAL_CAPSULE | Freq: Two times a day (BID) | ORAL | Status: DC
  Administered 2016-05-23 – 2016-05-25 (×4): 300 mg via ORAL
  Filled 2016-05-23 (×4): qty 1

## 2016-05-23 MED ORDER — LEVOTHYROXINE SODIUM 50 MCG PO TABS
50.0000 ug | ORAL_TABLET | Freq: Every day | ORAL | Status: DC
  Administered 2016-05-24 – 2016-05-25 (×2): 50 ug via ORAL
  Filled 2016-05-23 (×2): qty 1

## 2016-05-23 MED ORDER — FUROSEMIDE 20 MG PO TABS
20.0000 mg | ORAL_TABLET | Freq: Every day | ORAL | Status: DC
  Administered 2016-05-24 – 2016-05-25 (×2): 20 mg via ORAL
  Filled 2016-05-23 (×3): qty 1

## 2016-05-23 MED ORDER — HYDROMORPHONE HCL 1 MG/ML IJ SOLN: 0.5000 mg | INTRAMUSCULAR | Status: DC | PRN

## 2016-05-23 MED ORDER — LOSARTAN POTASSIUM 50 MG PO TABS: 100.0000 mg | ORAL_TABLET | Freq: Every day | ORAL | Status: DC

## 2016-05-23 MED ORDER — ASPIRIN 81 MG PO CHEW
324.0000 mg | CHEWABLE_TABLET | Freq: Once | ORAL | Status: AC
  Administered 2016-05-23: 324 mg via ORAL
  Filled 2016-05-23: qty 4

## 2016-05-23 MED ORDER — TRAZODONE HCL 50 MG PO TABS: 50.0000 mg | ORAL_TABLET | Freq: Every evening | ORAL | Status: DC | PRN

## 2016-05-23 MED ORDER — HEPARIN SODIUM (PORCINE) 5000 UNIT/ML IJ SOLN
5000.0000 [IU] | Freq: Three times a day (TID) | INTRAMUSCULAR | Status: DC
  Administered 2016-05-23 – 2016-05-25 (×5): 5000 [IU] via SUBCUTANEOUS
  Filled 2016-05-23 (×8): qty 1

## 2016-05-23 MED ORDER — ACETAMINOPHEN 325 MG PO TABS: 650.0000 mg | ORAL_TABLET | Freq: Four times a day (QID) | ORAL | Status: DC | PRN

## 2016-05-23 MED ORDER — HYDROCODONE-ACETAMINOPHEN 5-325 MG PO TABS: 1.0000 | ORAL_TABLET | ORAL | Status: DC | PRN

## 2016-05-23 MED ORDER — PANTOPRAZOLE SODIUM 40 MG PO TBEC
40.0000 mg | DELAYED_RELEASE_TABLET | Freq: Every day | ORAL | Status: DC
  Administered 2016-05-24 – 2016-05-25 (×2): 40 mg via ORAL
  Filled 2016-05-23 (×3): qty 1

## 2016-05-23 NOTE — ED Provider Notes (Signed)
[facility] DEPT Provider Note   CSN: [id number] Arrival date & time: 05/23/16  1236  First Provider Contact:  First MD Initiated Contact with Patient 05/23/16 1334        History   Chief Complaint Chief Complaint  Patient presents with  . Shortness of Breath    HPI  [name] is an 75 y.o. female with history of CAD, HTN, HLD, DM, neuropathy who presents to the ED for evaluation of chest pain and shortness of breath. She states this morning she was walking from the kitchen to her couch to eat her breakfast when suddenly she felt dizzy and lightheaded. She states she sat down on the couch and began feeling short of breath with a heavy chest pressure. She states she became very diaphoretic. States she felt weak all over. Did not pass out. She states she felt to weak to get up to call EMS or call for help. She states she sat there for "some time" until her symptoms improved on their own. She reports now in the ED she feels a bit generally weak and tired but her chest symptoms have resolved. Denies leg pain or swelling. Denies recent travel. Denies h/o blood clots. She is supposed to take baby aspirin daily but admits to taking it only intermittently.  Past Medical History:  Diagnosis Date  . Acute renal failure, 01/03/12 01/03/2012  . Angina   . Arthritis    "in my hands"  . Bronchitis   . Coronary artery disease    DR. HILTY IS PT'S CARDIOLOGIST  . Depression   . Diabetes mellitus   . Diabetic neuropathy ([place])   . Diverticulosis   . Esophageal stricture   . GERD (gastroesophageal reflux disease)   . Hemorrhoids   . Hiatal hernia   . High cholesterol   . History of nuclear stress test 03/10/2007   dipyridamole; low risk scan   . History of stomach ulcers 1970's  . Hypertension   . Hypothyroidism   . Myocardial infarction ([place]) 12/02/11   "they think I might have had a light heart attack this wk"  . Neuropathy ([place])   . Pain    RIGHT KNEE PAIN - TORN RIGHT MEDIAL  MENISCUS  . Pneumonia   . PONV (postoperative nausea and vomiting)   . S/P CABG (coronary artery bypass graft), 12/04/11 12/07/2011   LIMA to LAD, SVG to OM, SVG to RCA    Patient Active Problem List   Diagnosis Date Noted  . Memory loss 03/14/2015  . Chest tightness 09/16/2014  . Osteoarthritis of right knee 07/02/2014  . Acute medial meniscus tear of right knee 07/02/2014  . Diabetic peripheral neuropathy associated with type 1 diabetes mellitus ([place]) 09/05/2013  . Edema of extremities 04/18/2013  . Cellulitis 04/18/2013  . HTN (hypertension) 01/06/2012  . Acute renal failure,admitted 01/03/12, after admission for diastolic chf [id number]  . Acute diastolic chf 3/1 to [id number] 12/27/2011  . PAF post CABG, NSR now 12/24/2011  . S/P CABG (coronary artery bypass graft), 12/04/11 12/07/2011  . Hyperlipidemia LDL goal <70 12/03/2011    Class: Diagnosis of  . NSTEMI (non-ST elevated myocardial infarction) ([place]) 12/01/2011  . Family history of early CAD 12/01/2011  . DIABETES MELLITUS-TYPE II 12/14/2010  . DYSPHAGIA UNSPECIFIED 12/14/2010  . ABDOMINAL PAIN, EPIGASTRIC 12/14/2010  . ESOPHAGEAL STRICTURE 11/28/2009  . GERD 11/28/2009  . CONSTIPATION 11/28/2009  . FLATULENCE-GAS-BLOATING 11/28/2009  . CONSTIPATION, CHRONIC 12/19/2007  . SALPINGO-OOPHORITIS 12/19/2007  . MENOPAUSE, SURGICAL 12/19/2007  .  BACK PAIN, LUMBAR 12/19/2007  . ANKLE INJURY, RIGHT 12/19/2007  . TOTAL KNEE REPLACEMENT, LEFT, HX OF 12/19/2007    Past Surgical History:  Procedure Laterality Date  . ABDOMINAL HYSTERECTOMY  1980's  . BACK SURGERY  2006   "cyst growing near my spine"  . CARDIAC CATHETERIZATION  12/02/2011   mild LV dysfunction with mod hypocontractility of mid-distal anterolateral wall; CAD w/ostial tapering of L [place] with 50% diffuse ostial narrowing of LAD, 99% eccentric focal prox LAD stenosis followed by 70% prox LAD stenosis after 1st diag, 20% mid LAD narrowing; 80% ostial-to-prox L Cfx stenosis &  40-50% irregularity of RCA (Dr. Corky Downs)  . CATARACT EXTRACTION W/ INTRAOCULAR LENS  IMPLANT, BILATERAL  ~ 2010  . [place]  . CORONARY ARTERY BYPASS GRAFT  12/04/2011   Procedure: CORONARY ARTERY BYPASS GRAFTING (CABG);  Surgeon: Tharon Aquas Adelene Idler, MD;  Location: [facility];  Service: Open Heart Surgery;  Laterality: N/A;  CABG x three,  using left internal mammary artery, and right leg greater saphenous vein harvested endoscopically  . DILATION AND CURETTAGE OF UTERUS     "a couple times"  . FRACTURE SURGERY     "put pins both side right ankle"  . JOINT REPLACEMENT    . KNEE ARTHROSCOPY WITH MEDIAL MENISECTOMY Right 07/02/2014   Procedure: RIGHT KNEE ARTHROSCOPY WITH PARTIAL MEDIAL MENISTECTOMY, ABRASION CONDROPLASTYU OF PATELLA,ABRASION CONDROPLASTY OF MEDIAL FEMEROL CONDYL, MICROFRACTURE OF MEDIAL FEMEROL CONDYL;  Surgeon: Tobi Bastos, MD;  Location: [facility];  Service: Orthopedics;  Laterality: Right;  . LEFT HEART CATHETERIZATION WITH CORONARY ANGIOGRAM N/A 12/02/2011   Procedure: LEFT HEART CATHETERIZATION WITH CORONARY ANGIOGRAM;  Surgeon: Troy Sine, MD;  Location: [facility];  Service: Cardiovascular;  Laterality: N/A;  Coronary angiogram, possible PCI  . TONSILLECTOMY  1949  . TOTAL KNEE ARTHROPLASTY  ~ 2006   left  . TRANSTHORACIC ECHOCARDIOGRAM  02/19/2013   EF 0000000, grade 1 diastolic dysfunction; mildly thickend/calcified AV leaflets; mildly calcidied MV annulus; mild TR    OB History    No data available       Home Medications    Prior to Admission medications   Medication Sig Start Date End Date Taking? Authorizing Provider  amLODipine (NORVASC) 10 MG tablet TAKE ONE TABLET BY MOUTH IN THE MORNING 11/10/15   [name], MD  Cyanocobalamin (VITAMIN B 12 PO) Take 1 tablet by mouth daily.     Historical Provider, MD  esomeprazole (NEXIUM) 20 MG capsule Take 1 capsule (20 mg total) by mouth daily at 12 noon. 04/13/16   Irene Shipper, MD  furosemide  (LASIX) 20 MG tablet TAKE ONE TABLET BY MOUTH ONCE DAILY 10/10/15   [name], MD  gabapentin (NEURONTIN) 300 MG capsule Take 300 mg by mouth 2 (two) times daily.     Historical Provider, MD  insulin glargine (LANTUS) 100 UNIT/ML injection Inject 14 Units into the skin every morning.     Historical Provider, MD  levothyroxine (SYNTHROID, LEVOTHROID) 50 MCG tablet Take 50 mcg by mouth daily before breakfast.     Historical Provider, MD  losartan (COZAAR) 100 MG tablet  04/06/16   Historical Provider, MD  MAGNESIUM PO Take 1 tablet by mouth daily.     Historical Provider, MD  meclizine (ANTIVERT) 25 MG tablet Take 25 mg by mouth 4 (four) times daily as needed. For dizziness    Historical Provider, MD  Multiple Vitamins-Minerals (PRESERVISION AREDS 2 PO) Take 1  tablet by mouth daily.    Historical Provider, MD  ONE TOUCH ULTRA TEST test strip daily. 01/27/15   Historical Provider, MD  Polyethyl Glycol-Propyl Glycol (SYSTANE OP) Apply 1 drop to eye 2 (two) times daily.    Historical Provider, MD  sitaGLIPtin-metformin (JANUMET) 50-1000 MG per tablet Take 1 tablet by mouth 2 (two) times daily with a meal.    Historical Provider, MD  valsartan (DIOVAN) 320 MG tablet Take 1 tablet (320 mg total) by mouth daily. Patient not taking: Reported on 04/26/2016 04/13/16   [name], MD  vitamin C (ASCORBIC ACID) 500 MG tablet Take 500 mg by mouth daily.    Historical Provider, MD  vitamin E 400 UNIT capsule Take 400 Units by mouth daily. Reported on 11/06/2015    Historical Provider, MD    Family History Family History  Problem Relation Age of Onset  . Diabetes Mother     also, HTN & CVA  . Heart disease Father     also hyperlipidemia  . Breast cancer Sister     x 3  . Heart disease Brother     x5; one with MI  . Heart disease Sister     x3  . Diabetes Sister     x3  . Lung cancer Sister   . Breast cancer Sister     x2  . Colon cancer Neg Hx     Social History Social History  Substance  Use Topics  . Smoking status: Never Smoker  . Smokeless tobacco: Never Used  . Alcohol use 0.0 oz/week     Comment: very occasional      Allergies   Epinephrine; Other; and Sulfa antibiotics   Review of Systems Review of Systems 10 Systems reviewed and are negative for acute change except as noted in the HPI.   Physical Exam Updated Vital Signs BP 132/76   Pulse 90   Temp 98 F (36.7 C) (Oral)   Resp 18   Ht 5' (1.524 m)   Wt 62.1 kg   SpO2 99%   BMI 26.76 kg/m   Physical Exam  Constitutional: She is oriented to person, place, and time.  HENT:  Right Ear: External ear normal.  Left Ear: External ear normal.  Nose: Nose normal.  Mouth/Throat: Oropharynx is clear and moist. No oropharyngeal exudate.  Eyes: Conjunctivae and EOM are normal. Pupils are equal, round, and reactive to light.  Neck: Normal range of motion. Neck supple.  Cardiovascular: Normal rate, regular rhythm, normal heart sounds and intact distal pulses.   Pulmonary/Chest: Effort normal and breath sounds normal. No respiratory distress. She has no wheezes. She exhibits no tenderness.  Abdominal: Soft. Bowel sounds are normal. She exhibits no distension. There is no tenderness. There is no rebound and no guarding.  Musculoskeletal: She exhibits no edema.  Neurological: She is alert and oriented to person, place, and time. No cranial nerve deficit.  Skin: Skin is warm and dry.  Psychiatric: She has a normal mood and affect.  Nursing note and vitals reviewed.    ED Treatments / Results  Labs (all labs ordered are listed, but only abnormal results are displayed) Labs Reviewed  BASIC METABOLIC PANEL - Abnormal; Notable for the following:       Result Value   Potassium 5.6 (*)    CO2 18 (*)    Glucose, Bld 192 (*)    BUN 28 (*)    Creatinine, Ser 1.42 (*)    GFR calc non  Af Amer 35 (*)    GFR calc Af Amer 40 (*)    All other components within normal limits  CBC - Abnormal; Notable for the  following:    RBC 3.39 (*)    Hemoglobin 10.6 (*)    HCT 32.4 (*)    All other components within normal limits  I-STAT TROPOININ, ED    EKG  EKG Interpretation  Date/Time:  Sunday May 23 2016 12:50:01 EDT Ventricular Rate:  57 PR Interval:    QRS Duration: 74 QT Interval:  448 QTC Calculation: 436 R Axis:   97 Text Interpretation:  Junctional rhythm Rightward axis Septal infarct , age undetermined Abnormal ECG ST/T changes improved since 2013 Confirmed by [place] MD, [place] [phone]) on 05/23/2016 1:33:59 PM       Radiology Dg Chest 2 View  Result Date: 05/23/2016 CLINICAL DATA:  Chest heaviness. EXAM: CHEST  2 VIEW COMPARISON:  06/25/2014 FINDINGS: Postsurgical changes from CABG stable. The cardiac silhouette is mildly enlarged. Mediastinal contours appear intact. There is no evidence of focal airspace consolidation, pleural effusion or pneumothorax. Osseous structures are without acute abnormality. Soft tissues are grossly normal. IMPRESSION: Mildly enlarged cardiac silhouette. Electronically Signed   By: Fidela Salisbury M.D.   On: 05/23/2016 13:19   Procedures Procedures (including critical care time)  Medications Ordered in ED Medications - No data to display   Initial Impression / Assessment and Plan / ED Course  I have reviewed the triage vital signs and the nursing notes.  Pertinent labs & imaging results that were available during my care of the patient were reviewed by me and considered in my medical decision making (see chart for details).  Clinical Course    3:00 PM Spoke with Dr. Sallyanne Kuster of cards. Admit to medicine, cards will consult.  3:22 PM Spoke with Liborio Nixon of the hospitalist team who will admit to tele obs. We will add d-dimer and GI cocktail.  Final Clinical Impressions(s) / ED Diagnoses   Final diagnoses:  Shortness of breath  Chest pain, unspecified chest pain type  Generalized weakness  Renal insufficiency    New  Prescriptions New Prescriptions   No medications on file     Anne Ng, Hershal Coria 05/23/16 1522    Sherwood Gambler, MD 05/26/16 [phone]

## 2016-05-23 NOTE — Progress Notes (Signed)
Patient arrive in the unit accompanied by NT via stretcher. Orientation to the unit given. Patient verbalizes understanding. Daughter at bedside.

## 2016-05-23 NOTE — ED Triage Notes (Addendum)
Patient here with weakness and shortness of breath and upper chest pain since am, patient states she was so weak this am unable to get up off couch to call 911. On arrival alert and oriented, appears to be short of breath with answering questions. Patient also states that she became very diaphoretic this am. No diaphoresis on arrival

## 2016-05-23 NOTE — ED Notes (Signed)
Attempted report x1. 

## 2016-05-23 NOTE — H&P (Signed)
History and Physical    [name] [id number] DOB: 21-Jul-1941 DOA: 05/23/2016   PCP: Ileana Roup, MD   Patient coming from:  Home  Chief Complaint: Chst pain    HPI: [name] is a 75 y.o. female with medical history significant for HTN, HLD, CKD, DM with neuropathy, CAD s/p CABG, presenting with weakness, shortness of breath and upper chest pain since this morning,  Weak enough that was unable to get out of his couch o call 911. Reported diaphoresis this morning, but not on arrival. She reports similar but not as severe symptoms one week ago.  Denies any vision changes. Denies any headaches. No dysarthria or dysphagia. Denies any unilateral weakness or sensory deficiencies. Denies any confusion.  Denies any fever or chills, or night sweats. Denies any abdominal pain, or diarrhea. Deneis any leg pain or swelling.  Denies any sick contacts or new foods. Denies any recent long distance trips. No recent surgeries. Denies lower extremity swelling. Denies abnormal skin rashes, or neuropathy. Compliant with her medications but has not been taking ASA for about 1 year.   ED Course:  BP 189/64   Pulse (!) 57   Temp 98.5 F (36.9 C) (Oral)   Resp 17   Ht 5' (1.524 m)   Wt 62.1 kg (137 lb)   SpO2 100%   BMI 26.76 kg/m   Had very elevated BP as Physician entered room, then normalizing   Hemoglobin 10.6 potassium 5.6 creatinine 1.42 glucose 192  Received ASA. No NTG as no CP repoerted DDmer is pending CXR without acute changes.  EKG without ACS   Review of Systems: As per HPI otherwise 10 point review of systems negative.   Past Medical History:  Diagnosis Date  . Acute renal failure, 01/03/12 01/03/2012  . Angina   . Arthritis    "in my hands"  . Bronchitis   . Coronary artery disease    DR. HILTY IS PT'S CARDIOLOGIST  . Depression   . Diabetes mellitus   . Diabetic neuropathy ([place])   . Diverticulosis   . Esophageal stricture   . GERD (gastroesophageal reflux  disease)   . Hemorrhoids   . Hiatal hernia   . High cholesterol   . History of nuclear stress test 03/10/2007   dipyridamole; low risk scan   . History of stomach ulcers 1970's  . Hypertension   . Hypothyroidism   . Myocardial infarction ([place]) 12/02/11   "they think I might have had a light heart attack this wk"  . Neuropathy ([place])   . Pain    RIGHT KNEE PAIN - TORN RIGHT MEDIAL MENISCUS  . Pneumonia   . PONV (postoperative nausea and vomiting)   . S/P CABG (coronary artery bypass graft), 12/04/11 12/07/2011   LIMA to LAD, SVG to OM, SVG to RCA    Past Surgical History:  Procedure Laterality Date  . ABDOMINAL HYSTERECTOMY  1980's  . BACK SURGERY  2006   "cyst growing near my spine"  . CARDIAC CATHETERIZATION  12/02/2011   mild LV dysfunction with mod hypocontractility of mid-distal anterolateral wall; CAD w/ostial tapering of L [place] with 50% diffuse ostial narrowing of LAD, 99% eccentric focal prox LAD stenosis followed by 70% prox LAD stenosis after 1st diag, 20% mid LAD narrowing; 80% ostial-to-prox L Cfx stenosis & 40-50% irregularity of RCA (Dr. Corky Downs)  . CATARACT EXTRACTION W/ INTRAOCULAR LENS  IMPLANT, BILATERAL  ~ 2010  . [place]  . CORONARY  ARTERY BYPASS GRAFT  12/04/2011   Procedure: CORONARY ARTERY BYPASS GRAFTING (CABG);  Surgeon: Tharon Aquas Adelene Idler, MD;  Location: [facility];  Service: Open Heart Surgery;  Laterality: N/A;  CABG x three,  using left internal mammary artery, and right leg greater saphenous vein harvested endoscopically  . DILATION AND CURETTAGE OF UTERUS     "a couple times"  . FRACTURE SURGERY     "put pins both side right ankle"  . JOINT REPLACEMENT    . KNEE ARTHROSCOPY WITH MEDIAL MENISECTOMY Right 07/02/2014   Procedure: RIGHT KNEE ARTHROSCOPY WITH PARTIAL MEDIAL MENISTECTOMY, ABRASION CONDROPLASTYU OF PATELLA,ABRASION CONDROPLASTY OF MEDIAL FEMEROL CONDYL, MICROFRACTURE OF MEDIAL FEMEROL CONDYL;  Surgeon: Tobi Bastos, MD;  Location: [facility]  [facility];  Service: Orthopedics;  Laterality: Right;  . LEFT HEART CATHETERIZATION WITH CORONARY ANGIOGRAM N/A 12/02/2011   Procedure: LEFT HEART CATHETERIZATION WITH CORONARY ANGIOGRAM;  Surgeon: Troy Sine, MD;  Location: [facility] C[facility];  Service: Cardiovascular;  Laterality: N/A;  Coronary angiogram, possible PCI  . TONSILLECTOMY  1949  . TOTAL KNEE ARTHROPLASTY  ~ 2006   left  . TRANSTHORACIC ECHOCARDIOGRAM  02/19/2013   EF 0000000, grade 1 diastolic dysfunction; mildly thickend/calcified AV leaflets; mildly calcidied MV annulus; mild TR    Social History Social History   Social History  . Marital status: Widowed    Spouse name: N/A  . Number of children: 2  . Years of education: 12   Occupational History  . Not on file.   Social History Main Topics  . Smoking status: Never Smoker  . Smokeless tobacco: Never Used  . Alcohol use 0.0 oz/week     Comment: very occasional   . Drug use: No  . Sexual activity: No   Other Topics Concern  . Not on file   Social History Narrative  . No narrative on file     Allergies  Allergen Reactions  . Crestor  [Rosuvastatin Calcium]     Other reaction(s): tired and weak  . Epinephrine Other (See Comments)    Abnormal feeling. Dental exam/injection of local w/ epi.  . Other     MANGO'S - WHELPS ALL OVER  . Sulfa Antibiotics     Family History  Problem Relation Age of Onset  . Diabetes Mother     also, HTN & CVA  . Heart disease Father     also hyperlipidemia  . Breast cancer Sister     x 3  . Heart disease Brother     x5; one with MI  . Heart disease Sister     x3  . Diabetes Sister     x3  . Lung cancer Sister   . Breast cancer Sister     x2  . Colon cancer Neg Hx       Prior to Admission medications   Medication Sig Start Date End Date Taking? Authorizing Provider  amLODipine (NORVASC) 10 MG tablet TAKE ONE TABLET BY MOUTH IN THE MORNING 11/10/15   [name], MD  Cyanocobalamin (VITAMIN B 12 PO) Take 1 tablet by  mouth daily.     Historical Provider, MD  esomeprazole (NEXIUM) 20 MG capsule Take 1 capsule (20 mg total) by mouth daily at 12 noon. 04/13/16   Irene Shipper, MD  furosemide (LASIX) 20 MG tablet TAKE ONE TABLET BY MOUTH ONCE DAILY 10/10/15   [name], MD  gabapentin (NEURONTIN) 300 MG capsule Take 300 mg by mouth 2 (two) times daily.  Historical Provider, MD  insulin glargine (LANTUS) 100 UNIT/ML injection Inject 14 Units into the skin every morning.     Historical Provider, MD  levothyroxine (SYNTHROID, LEVOTHROID) 50 MCG tablet Take 50 mcg by mouth daily before breakfast.     Historical Provider, MD  losartan (COZAAR) 100 MG tablet  04/06/16   Historical Provider, MD  MAGNESIUM PO Take 1 tablet by mouth daily.     Historical Provider, MD  meclizine (ANTIVERT) 25 MG tablet Take 25 mg by mouth 4 (four) times daily as needed. For dizziness    Historical Provider, MD  Multiple Vitamins-Minerals (PRESERVISION AREDS 2 PO) Take 1 tablet by mouth daily.    Historical Provider, MD  ONE TOUCH ULTRA TEST test strip daily. 01/27/15   Historical Provider, MD  Polyethyl Glycol-Propyl Glycol (SYSTANE OP) Apply 1 drop to eye 2 (two) times daily.    Historical Provider, MD  sitaGLIPtin-metformin (JANUMET) 50-1000 MG per tablet Take 1 tablet by mouth 2 (two) times daily with a meal.    Historical Provider, MD  valsartan (DIOVAN) 320 MG tablet Take 1 tablet (320 mg total) by mouth daily. Patient not taking: Reported on 04/26/2016 04/13/16   [name], MD  vitamin C (ASCORBIC ACID) 500 MG tablet Take 500 mg by mouth daily.    Historical Provider, MD  vitamin E 400 UNIT capsule Take 400 Units by mouth daily. Reported on 11/06/2015    Historical Provider, MD    Physical Exam:    Vitals:   05/23/16 1242 05/23/16 1415 05/23/16 1545  BP: (!) 160/113 120/96 189/64  Pulse: (!) 59 61 (!) 57  Resp: 18 11 17   Temp: 98.5 F (36.9 C)    TempSrc: Oral    SpO2: 100% 100% 100%  Weight: 62.1 kg (137 lb)      Height: 5' (1.524 m)         Constitutional: NAD, calm, comfortable   Vitals:   05/23/16 1242 05/23/16 1415 05/23/16 1545  BP: (!) 160/113 120/96 189/64  Pulse: (!) 59 61 (!) 57  Resp: 18 11 17   Temp: 98.5 F (36.9 C)    TempSrc: Oral    SpO2: 100% 100% 100%  Weight: 62.1 kg (137 lb)    Height: 5' (1.524 m)     Eyes: PERRL, lids and conjunctivae normal ENMT: Mucous membranes are moist. Posterior pharynx clear of any exudate or lesions.Normal dentition.  Neck: normal, supple, no masses, no thyromegaly Respiratory: clear to auscultation bilaterally, no wheezing, no crackles. Normal respiratory effort. No accessory muscle use.  Cardiovascular: Regular rate and rhythm, no murmurs / rubs / gallops. No extremity edema. 2+ pedal pulses. No carotid bruits.  Abdomen: no tenderness, no masses palpated. No hepatosplenomegaly. Bowel sounds positive.  Musculoskeletal: no clubbing / cyanosis. No joint deformity upper and lower extremities. Good ROM, no contractures. Normal muscle tone.  Skin: no rashes, lesions, ulcers.  Neurologic: CN 2-12 grossly intact. Sensation intact, DTR normal. Strength 5/5 in all 4.  Psychiatric: Normal judgment and insight. Alert and oriented x 3. Normal mood.     Labs on Admission: I have personally reviewed following labs and imaging studies  CBC:  Recent Labs Lab 05/23/16 1252  WBC 7.2  HGB 10.6*  HCT 32.4*  MCV 95.6  PLT 0000000    Basic Metabolic Panel:  Recent Labs Lab 05/23/16 1252  NA 137  K 5.6*  CL 108  CO2 18*  GLUCOSE 192*  BUN 28*  CREATININE 1.42*  CALCIUM 9.0  GFR: Estimated Creatinine Clearance: 27.7 mL/min (by C-G formula based on SCr of 1.42 mg/dL).  Liver Function Tests: No results for input(s): AST, ALT, ALKPHOS, BILITOT, PROT, ALBUMIN in the last 168 hours. No results for input(s): LIPASE, AMYLASE in the last 168 hours. No results for input(s): AMMONIA in the last 168 hours.  Coagulation Profile: No results for  input(s): INR, PROTIME in the last 168 hours.  Cardiac Enzymes: No results for input(s): CKTOTAL, CKMB, CKMBINDEX, TROPONINI in the last 168 hours.  BNP (last 3 results) No results for input(s): PROBNP in the last 8760 hours.  HbA1C: No results for input(s): HGBA1C in the last 72 hours.  CBG: No results for input(s): GLUCAP in the last 168 hours.  Lipid Profile: No results for input(s): CHOL, HDL, LDLCALC, TRIG, CHOLHDL, LDLDIRECT in the last 72 hours.  Thyroid Function Tests: No results for input(s): TSH, T4TOTAL, FREET4, T3FREE, THYROIDAB in the last 72 hours.  Anemia Panel: No results for input(s): VITAMINB12, FOLATE, FERRITIN, TIBC, IRON, RETICCTPCT in the last 72 hours.  Urine analysis:    Component Value Date/Time   COLORURINE YELLOW 01/04/2012 1105   APPEARANCEUR CLOUDY (A) 01/04/2012 1105   LABSPEC 1.015 01/04/2012 1105   PHURINE 5.0 01/04/2012 1105   GLUCOSEU NEGATIVE 01/04/2012 1105   HGBUR NEGATIVE 01/04/2012 1105   BILIRUBINUR NEGATIVE 01/04/2012 1105   KETONESUR NEGATIVE 01/04/2012 1105   PROTEINUR NEGATIVE 01/04/2012 1105   UROBILINOGEN 0.2 01/04/2012 1105   NITRITE NEGATIVE 01/04/2012 1105   LEUKOCYTESUR MODERATE (A) 01/04/2012 1105    Sepsis Labs: @LABRCNTIP (procalcitonin:4,lacticidven:4) )No results found for this or any previous visit (from the past 240 hour(s)).   Radiological Exams on Admission: Dg Chest 2 View  Result Date: 05/23/2016 CLINICAL DATA:  Chest heaviness. EXAM: CHEST  2 VIEW COMPARISON:  06/25/2014 FINDINGS: Postsurgical changes from CABG stable. The cardiac silhouette is mildly enlarged. Mediastinal contours appear intact. There is no evidence of focal airspace consolidation, pleural effusion or pneumothorax. Osseous structures are without acute abnormality. Soft tissues are grossly normal. IMPRESSION: Mildly enlarged cardiac silhouette. Electronically Signed   By: Fidela Salisbury M.D.   On: 05/23/2016 13:19   EKG: Independently  reviewed.  Assessment/Plan Active Problems:   GERD   Hyperlipidemia LDL goal <70   S/P CABG (coronary artery bypass graft), 12/04/11   HTN (hypertension)   Diabetic peripheral neuropathy associated with type 1 diabetes mellitus (HCC)   Chest tightness   Shortness of breath   Pre-syncope  Pre- Syncope.  EKG unrevealing. Neuro exam unremarkable. No rubs on exam. Diaphoresis on presentation, with dizziness and chest tightness prior to admission.  No dizziness at this time. Not on ASA. CXR unrevealing. EKG without ACS. Tn neg. K 5.6  Syncope order set  -admit for observation - Tele bed. -2 D echo (last in 2014 )  IV fluids Cards to see  Urine cultures  Will consider CTA if DDmer is positive  Meclizine prn for diziness   CAD, patient is cardiac pain free at this time. Tn neg, EKG without ACS . Heart score 4 Continue ASA,  statin, beta blockers  Type II Diabetes  With neuropathy Current blood sugar level is 192 Lab Results  Component Value Date   HGBA1C 7.3 (H) 12/03/2011   Hgb A1C Hold home oral diabetic medications.  SSI Heart healthy carb modified diet. Continue neurontin  Hypertension BP 189/64   Pulse No well controlled Cards to see, will evaluate meds Continue home anti-hypertensive medications  Add hydralazine for BP 160/90  Chronic kidney disease stage  IIIB 30-44   baseline creatinine  1.5 , currently at 1.42 , K at 5.6   Lab Results  Component Value Date   CREATININE 1.42 (H) 05/23/2016   CREATININE 1.50 (H) 06/03/2015   CREATININE 1.27 (H) 07/02/2014  Hold sartan today   IVF  Repeat CMET in am  Anemia of chronic disease Hemoglobin  on admission 10.6, bl is arountd11 .  No intervention is indicated at this time, no bleeding issues. Repeat CBC in am   Hypothyroidism: -Continue home Synthroid  GERD, no acute symptoms: Continue PPI     DVT prophylaxis:  Heparin   Code Status:   Full   Family Communication:  Discussed with patient  daughter Disposition Plan: Expect patient to be discharged to home after condition improves Consults called:    None Admission status:Tele  Obs   WERTMAN,SARA E, PA-C [facility]   If 7PM-7AM, please contact night-coverage [email or web address] Password [email or web address]  05/23/2016, 4:00 PM

## 2016-05-24 ENCOUNTER — Observation Stay (HOSPITAL_COMMUNITY): Payer: Medicare Other

## 2016-05-24 ENCOUNTER — Other Ambulatory Visit (HOSPITAL_COMMUNITY): Payer: Medicare Other

## 2016-05-24 DIAGNOSIS — R079 Chest pain, unspecified: Secondary | ICD-10-CM | POA: Diagnosis not present

## 2016-05-24 DIAGNOSIS — N289 Disorder of kidney and ureter, unspecified: Secondary | ICD-10-CM

## 2016-05-24 DIAGNOSIS — E785 Hyperlipidemia, unspecified: Secondary | ICD-10-CM

## 2016-05-24 DIAGNOSIS — R0789 Other chest pain: Secondary | ICD-10-CM | POA: Diagnosis not present

## 2016-05-24 DIAGNOSIS — I10 Essential (primary) hypertension: Secondary | ICD-10-CM

## 2016-05-24 DIAGNOSIS — Z951 Presence of aortocoronary bypass graft: Secondary | ICD-10-CM

## 2016-05-24 DIAGNOSIS — R0602 Shortness of breath: Secondary | ICD-10-CM

## 2016-05-24 DIAGNOSIS — K219 Gastro-esophageal reflux disease without esophagitis: Secondary | ICD-10-CM

## 2016-05-24 LAB — URINE CULTURE

## 2016-05-24 LAB — BASIC METABOLIC PANEL
Anion gap: 8 (ref 5–15)
BUN: 25 mg/dL — ABNORMAL HIGH (ref 6–20)
CO2: 23 mmol/L (ref 22–32)
Calcium: 9.2 mg/dL (ref 8.9–10.3)
Chloride: 109 mmol/L (ref 101–111)
Creatinine, Ser: 1.57 mg/dL — ABNORMAL HIGH (ref 0.44–1.00)
GFR calc Af Amer: 36 mL/min — ABNORMAL LOW (ref >60)
GFR calc non Af Amer: 31 mL/min — ABNORMAL LOW (ref >60)
Glucose, Bld: 138 mg/dL — ABNORMAL HIGH (ref 65–99)
Potassium: 5.1 mmol/L (ref 3.5–5.1)
Sodium: 140 mmol/L (ref 135–145)

## 2016-05-24 LAB — GLUCOSE, CAPILLARY
Glucose-Capillary: 122 mg/dL — ABNORMAL HIGH (ref 65–99)
Glucose-Capillary: 149 mg/dL — ABNORMAL HIGH (ref 65–99)
Glucose-Capillary: 134 mg/dL — ABNORMAL HIGH (ref 65–99)
Glucose-Capillary: 211 mg/dL — ABNORMAL HIGH (ref 65–99)

## 2016-05-24 LAB — HEMOGLOBIN A1C
Hgb A1c MFr Bld: 6.5 % — ABNORMAL HIGH (ref 4.8–5.6)
Mean Plasma Glucose: 140 mg/dL

## 2016-05-24 MED ORDER — TECHNETIUM TC 99M DIETHYLENETRIAME-PENTAACETIC ACID: 32.3000 | Freq: Once | INTRAVENOUS | Status: DC | PRN

## 2016-05-24 MED ORDER — HYDRALAZINE HCL 20 MG/ML IJ SOLN
5.0000 mg | INTRAMUSCULAR | Status: DC | PRN
Start: 2016-05-24 — End: 2016-05-25
  Administered 2016-05-24 (×2): 5 mg via INTRAVENOUS
  Filled 2016-05-24 (×2): qty 1

## 2016-05-24 MED ORDER — HYDRALAZINE HCL 50 MG PO TABS
50.0000 mg | ORAL_TABLET | Freq: Two times a day (BID) | ORAL | Status: DC
  Administered 2016-05-24 – 2016-05-25 (×3): 50 mg via ORAL
  Filled 2016-05-24 (×3): qty 1

## 2016-05-24 MED ORDER — TECHNETIUM TO 99M ALBUMIN AGGREGATED
4.3200 | Freq: Once | INTRAVENOUS | Status: AC | PRN
  Administered 2016-05-24: 4 via INTRAVENOUS

## 2016-05-24 NOTE — Telephone Encounter (Signed)
REFILL 

## 2016-05-24 NOTE — Care Management Obs Status (Signed)
[place] NOTIFICATION   Patient Details  Name: [name] MRN: [id number] Date of Birth: [birth year]   Medicare Observation Status Notification Given:  Yes    Royston Bake, RN 05/24/2016, 11:15 AM

## 2016-05-24 NOTE — Progress Notes (Signed)
Pt a/o, no c/o pain, VSS, pt stable

## 2016-05-24 NOTE — Care Management Obs Status (Deleted)
[place] NOTIFICATION   Patient Details  Name: [name] MRN: [id number] Date of Birth: 01/04/1941   Medicare Observation Status Notification Given:  Yes    Royston Bake, RN 05/24/2016, 11:15 AM

## 2016-05-24 NOTE — Consult Note (Addendum)
Cardiology Consult    Patient ID: [name] MRN: [id number], DOB/AGE: 1941/10/24   Admit date: 05/23/2016 Date of Consult: 05/24/2016  Primary Physician: Ileana Roup, MD Primary Cardiologist: Dr. Debara Pickett Requesting Provider: Dr. Doyle Askew Reason for Consultation: Weakness, chest pain  Patient Profile    75 yo female with PMH of CAD s/p CABG x3 LIMA to LAD, SVG to OM and SVG to right coronary 2013, hypertension, diabetes, hyperlipidemia, CKD, GERD, and diastolic CHF who presented to the [facility] ED with complaints of increased weakness, shortness of breath and upper chest pressure.   Past Medical History   Past Medical History:  Diagnosis Date  . Acute renal failure, 01/03/12 01/03/2012  . Angina   . Arthritis    "in my hands"  . Bronchitis   . Coronary artery disease    DR. HILTY IS PT'S CARDIOLOGIST  . Depression   . Diabetes mellitus   . Diabetic neuropathy ([place])   . Diverticulosis   . Esophageal stricture   . GERD (gastroesophageal reflux disease)   . Hemorrhoids   . Hiatal hernia   . High cholesterol   . History of nuclear stress test 03/10/2007   dipyridamole; low risk scan   . History of stomach ulcers 1970's  . Hypertension   . Hypothyroidism   . Myocardial infarction ([place]) 12/02/11   "they think I might have had a light heart attack this wk"  . Neuropathy ([place])   . Pain    RIGHT KNEE PAIN - TORN RIGHT MEDIAL MENISCUS  . Pneumonia   . PONV (postoperative nausea and vomiting)   . S/P CABG (coronary artery bypass graft), 12/04/11 12/07/2011   LIMA to LAD, SVG to OM, SVG to RCA    Past Surgical History:  Procedure Laterality Date  . ABDOMINAL HYSTERECTOMY  1980's  . BACK SURGERY  2006   "cyst growing near my spine"  . CARDIAC CATHETERIZATION  12/02/2011   mild LV dysfunction with mod hypocontractility of mid-distal anterolateral wall; CAD w/ostial tapering of L [place] with 50% diffuse ostial narrowing of LAD, 99% eccentric focal prox LAD stenosis followed  by 70% prox LAD stenosis after 1st diag, 20% mid LAD narrowing; 80% ostial-to-prox L Cfx stenosis & 40-50% irregularity of RCA (Dr. Corky Downs)  . CATARACT EXTRACTION W/ INTRAOCULAR LENS  IMPLANT, BILATERAL  ~ 2010  . [place]  . CORONARY ARTERY BYPASS GRAFT  12/04/2011   Procedure: CORONARY ARTERY BYPASS GRAFTING (CABG);  Surgeon: Tharon Aquas Adelene Idler, MD;  Location: [facility];  Service: Open Heart Surgery;  Laterality: N/A;  CABG x three,  using left internal mammary artery, and right leg greater saphenous vein harvested endoscopically  . DILATION AND CURETTAGE OF UTERUS     "a couple times"  . FRACTURE SURGERY     "put pins both side right ankle"  . JOINT REPLACEMENT    . KNEE ARTHROSCOPY WITH MEDIAL MENISECTOMY Right 07/02/2014   Procedure: RIGHT KNEE ARTHROSCOPY WITH PARTIAL MEDIAL MENISTECTOMY, ABRASION CONDROPLASTYU OF PATELLA,ABRASION CONDROPLASTY OF MEDIAL FEMEROL CONDYL, MICROFRACTURE OF MEDIAL FEMEROL CONDYL;  Surgeon: Tobi Bastos, MD;  Location: [facility];  Service: Orthopedics;  Laterality: Right;  . LEFT HEART CATHETERIZATION WITH CORONARY ANGIOGRAM N/A 12/02/2011   Procedure: LEFT HEART CATHETERIZATION WITH CORONARY ANGIOGRAM;  Surgeon: Troy Sine, MD;  Location: [facility] CATH LAB;  Service: Cardiovascular;  Laterality: N/A;  Coronary angiogram, possible PCI  . TONSILLECTOMY  1949  . TOTAL KNEE ARTHROPLASTY  ~ 2006  left  . TRANSTHORACIC ECHOCARDIOGRAM  02/19/2013   EF 0000000, grade 1 diastolic dysfunction; mildly thickend/calcified AV leaflets; mildly calcidied MV annulus; mild TR     Allergies  Allergies  Allergen Reactions  . Crestor  [Rosuvastatin Calcium]     Other reaction(s): tired and weak  . Epinephrine Other (See Comments)    Abnormal feeling. Dental exam/injection of local w/ epi.  . Other     MANGO'S - WHELPS ALL OVER  . Sulfa Antibiotics     History of Present Illness    [name] is a 75 year old female patient of Dr. Debara Pickett with past medical history  of CAD s/p CABG x3 LIMA to LAD, SVG to OM and SVG to right coronary 2013, hypertension, diabetes, hyperlipidemia, CKD, GERD, and diastolic CHF. Her last 2-D echocardiogram showed a normal EF of 55-65%, with grade 1 diastolic dysfunction and evidence for elevated mean left atrial filling pressures. She had mildly thickened and calcified aortic valve leaflets but no stenosis and mild tricuspid regurgitation. She was last seen by Dr. Debara Pickett in the office on 02/13/2016 where she reported some back pain and associated weakness in her legs. At that time she was also undergoing memory testing which indicated a possible dementia and depression. At that time her blood pressure medication regimen included losartan 50 mg daily, blood pressure continued to remain elevated with systolic greater than Q000111Q. She has been followed in the office by pharmacy who titrated up her losartan 200 mg daily, and changed her amlodipine to 10 mg in the evening. At her most recent office visit on 05/11/2016 she was changed to valsartan 320 mg daily, and her amlodipine 10 mg continued.   She reports currently living independently in her home and caring for herself. States that she has noticed a decline in her overall physical activity due to increased general weakness and dyspnea on exertion over the past couple weeks. Stating that she normally takes her dog out for a walk on a regular basis but has had to stop and take breaks more often. She reports having an episode on Friday evening where she became short of breath, diaphoretic, and generally weak all over while she was at vacation [employer] school at her church. He reports he had another episode yesterday morning after getting up to make her breakfast. During that time she experienced weakness, shortness of breath, diaphoresis and upper chest pressure. She had to sit down and catch her breath, and eventually the symptoms subsided after about 30 minutes. Denies any palpitations, lightheadedness,  palpitations or nausea or vomiting. He also denies any dry cough or lower extremity swelling.    Her admission labs showed an elevated potassium of 5.6, creatinine of 1.42, 1 negative POC troponin, BNP 359, and chest x-ray negative for acute edema or infiltrate. EKG showed SR without acute ST/T wave abnormalities. At the time of her admission her d-dimer was pending, but resulted at 2.39. Her blood pressure was also noted to be in the A999333 systolic. She was admitted to internal medicine for further workup and management.  Inpatient Medications    . amLODipine  10 mg Oral q morning - 10a  . furosemide  20 mg Oral Daily  . gabapentin  300 mg Oral BID  . heparin  5,000 Units Subcutaneous Q8H  . insulin aspart  0-9 Units Subcutaneous TID WC  . levothyroxine  50 mcg Oral QAC breakfast  . pantoprazole  40 mg Oral Daily  . senna  1 tablet Oral BID  .  sodium chloride flush  3 mL Intravenous Q12H    Family History    Family History  Problem Relation Age of Onset  . Diabetes Mother     also, HTN & CVA  . Heart disease Father     also hyperlipidemia  . Breast cancer Sister     x 3  . Heart disease Brother     x5; one with MI  . Heart disease Sister     x3  . Diabetes Sister     x3  . Lung cancer Sister   . Breast cancer Sister     x2  . Colon cancer Neg Hx     Social History    Social History   Social History  . Marital status: Widowed    Spouse name: N/A  . Number of children: 2  . Years of education: 12   Occupational History  . Not on file.   Social History Main Topics  . Smoking status: Never Smoker  . Smokeless tobacco: Never Used  . Alcohol use 0.0 oz/week     Comment: very occasional   . Drug use: No  . Sexual activity: No   Other Topics Concern  . Not on file   Social History Narrative  . No narrative on file     Review of Systems    General:  No chills, fever, night sweats or weight changes.  Cardiovascular: See HPI Dermatological: No rash,  lesions/masses Respiratory: No cough, dyspnea Urologic: No hematuria, dysuria Abdominal:   No nausea, vomiting, diarrhea, bright red blood per rectum, melena, or hematemesis Neurologic:  No visual changes, + wkns, changes in mental status. All other systems reviewed and are otherwise negative except as noted above.  Physical Exam    Blood pressure (!) 206/51, pulse 61, temperature 98.1 F (36.7 C), temperature source Oral, resp. rate 18, height 5' (1.524 m), weight 137 lb 7.5 oz (62.4 kg), SpO2 100 %.  General: Pleasant older female, NAD Psych: Normal affect. Neuro: Alert and oriented X 3. Moves all extremities spontaneously. HEENT: Normal  Neck: Supple without bruits or JVD. Lungs:  Resp regular and unlabored, CTA. Heart: RRR no s3, s4, or murmurs. Abdomen: Soft, non-tender, non-distended, BS + x 4.  Extremities: No clubbing, cyanosis or edema. DP/PT/Radials 2+ and equal bilaterally.  Labs    Troponin [facility] Test)  Recent Labs  05/23/16 1305  TROPIPOC 0.01   No results for input(s): CKTOTAL, CKMB, TROPONINI in the last 72 hours. Lab Results  Component Value Date   WBC 7.2 05/23/2016   HGB 10.6 (L) 05/23/2016   HCT 32.4 (L) 05/23/2016   MCV 95.6 05/23/2016   PLT 193 05/23/2016    Recent Labs Lab 05/23/16 1252 05/23/16 2009  NA 137  --   K 5.6* 5.9*  CL 108  --   CO2 18*  --   BUN 28*  --   CREATININE 1.42*  --   CALCIUM 9.0  --   GLUCOSE 192*  --    Lab Results  Component Value Date   CHOL 254 (H) 12/02/2011   HDL 36 (L) 12/02/2011   LDLCALC 143 (H) 12/02/2011   TRIG 375 (H) 12/02/2011   Lab Results  Component Value Date   DDIMER 2.39 (H) 05/23/2016     Radiology Studies    Dg Chest 2 View  Result Date: 05/23/2016 CLINICAL DATA:  Chest heaviness. EXAM: CHEST  2 VIEW COMPARISON:  06/25/2014 FINDINGS: Postsurgical changes from CABG stable.  The cardiac silhouette is mildly enlarged. Mediastinal contours appear intact. There is no evidence of  focal airspace consolidation, pleural effusion or pneumothorax. Osseous structures are without acute abnormality. Soft tissues are grossly normal. IMPRESSION: Mildly enlarged cardiac silhouette. Electronically Signed   By: Fidela Salisbury M.D.   On: 05/23/2016 13:19   ECG & Cardiac Imaging    EKG: SR without acute ST/T wave abnormalities  Echo: 02/19/2013  Study Conclusions  - Left ventricle: The cavity size was normal. Wall thickness was normal. Systolic function was normal. The estimated ejection fraction was in the range of 55% to 65%. Wall motion was normal; there were no regional wall motion abnormalities. Doppler parameters are consistent with abnormal left ventricular relaxation (grade 1 diastolic dysfunction). Doppler parameters are consistent with elevated mean left atrial filling pressure. - Aortic valve: Trileaflet; mildly thickened, mildly calcified leaflets. - Mitral valve: Mildly calcified annulus. - Atrial septum: No defect or patent foramen ovale was identified. - Tricuspid valve: Mild regurgitation.  Assessment & Plan    [name] is a 75 year old female patient of Dr. Debara Pickett with past medical history of CAD s/p CABG x3 LIMA to LAD, SVG to OM and SVG to right coronary 2013, hypertension, diabetes, hyperlipidemia, CKD, GERD, and diastolic CHF. Her last 2-D echocardiogram showed a normal EF of 55-65%, with grade 1 diastolic dysfunction and evidence for elevated mean left atrial filling pressures. She had mildly thickened and calcified aortic valve leaflets but no stenosis and mild tricuspid regurgitation. She was last seen by Dr. Debara Pickett in the office on 02/13/2016 where she reported some back pain and associated weakness in her legs. At that time she was also undergoing memory testing which indicated a possible dementia and depression. At that time her blood pressure medication regimen included losartan 50 mg daily, blood pressure continued to remain  elevated with systolic greater than [id number]. She has been followed in the office by pharmacy who titrated up her losartan 100 mg daily, and changed her amlodipine to 10 mg in the evening. At her most recent office visit on 05/11/2016 she was changed to valsartan 320 mg daily, and her amlodipine 10 mg continued.  1. [name]: Reports she has had couple of episodes of sudden onset of weakness, dyspnea, difficulty catching her breath, and chest pressure in the past couple of weeks. Last episode was yesterday morning while she was getting up making her breakfast. Reports having a sudden onset of weakness, dyspnea, diaphoresis and chest pressure. States she sat down and the symptoms eventually subsided. Reports this episode was worse than the previous she had experienced. In the ED Trop neg x1. Also noted to be hypertensive with systolic BP [id number]. Chest x-ray negative for edema. 2014 2D echo with normal EF and grade 1 DD. -- Was on valsartan 320mg  at home, along with amlodipine 10mg  -- Continue to hold ARB in the setting of elevated Cr and hyperkalemia -- Add hydralazine 50mg  TID given her BP remains elevated -- Repeat 2D echo  -- D-dimer elevated at 2.39, may need VQ scan given elevated Cr  2. HTN: Bp remained elevated. ARB held in the setting of elevated Cr -- Continue amlodipine -- Add hydralazine PO 50mg  TID  3. CAD s/p CABG x3 LIMA to LAD, SVG to OM and SVG to right coronary (2013): No further reports of chest pressure since admission.  -- may need ischemic eval. Defer to MD    4. CKD III/Hyperkalemia: Continue to hold ARB at this time. Monitor renal function  Barnet Pall, NP-C Pager [phone] 05/24/2016, 1:43 PM   Patient seen and examined. Agree with assessment and plan. [name] is a very pleasant 75 year old Caucasian female who was a former patient of Dr. Chase Picket and is now followed by Dr. Debara Pickett since Dr. Eddie Dibbles retirement.  In February 2013 she was found  to have severe multivessel CAD and underwent CABG surgery 3 with a LIMA to the LAD, SVG to the OM, and SVG to the RCA.  She has a history of hypertension, type 2 diabetes mellitus, hyperlipidemia, stage III chronic kidney disease, GERD, as well as diastolic CHF.  She has not had any ischemic evaluation since her CABG surgery.  She has had recent difficulty with blood pressure control.  Recently her losartan was changed to valsartan 3-20 mg daily.  She has been maintained on amlodipine.  She has noticed recent progressive weakness and shortness of breath with activity.  There also has been a vague chest sensation without definitive chest tightness.  She was admitted yesterday and was hyperkalemic on admission.  Her valsartan was held.  Her subsequent blood pressures have been elevated to a maximum of 99991111 systolic.  Laboratories notable for a creatinine of 1.42, giving an estimated GFR 35, consistent with stage III chronic kidney disease.  BNP was elevated at 359 and troponins are negative.  She most likely has a component of diastolic CHF.  Her d-dimer is elevated at 2.39.  Her ECG was unremarkable.  Laboratories also notable for anemia with a hemoglobin of 10.6 and hematocrit of 32.4.  At present, with significant blood pressure elevation.  Will add hydralazine initially at 50 mg twice a day and titrate as blood pressure allows.  Recommend a VQ scan in light of her positive d-dimer as a screening assessment for PE and positive or equivocal, consider CT angiography.  She has never had an ischemic evaluation following her CABG surgery and with her change in symptomatology with exertional dyspnea.  We will schedule the patient for Lexiscan nuclear stress test tomorrow.  2-D echo Doppler study has already been ordered , which has not yet been completed.  She has mixed hyperlipidemia and is a diabetic with CAD.  Target LDL is less than 70.  Her lipid panel is suggestive of an atherogenic dyslipidemic profile with  elevation of triglycerides and low HDL level.  Consider rechallenge of low-dose statin and  consider fenofibrate or omega-3 fatty acids for triglyceride reduction.  We will follow patient with you.  Troy Sine, MD, [facility] 05/24/2016 3:18 PM

## 2016-05-24 NOTE — Progress Notes (Addendum)
Patient ID: [name], female   DOB: 29-Jan-1941, 75 y.o.   MRN: [id number]    PROGRESS NOTE    [name]  [id number] DOB: June 13, 1941 DOA: 05/23/2016  PCP: Ileana Roup, MD   Brief Narrative:  75 y.o. female with HTN (recently changed from Cozaar to valsartan), HLD, CKD, DM with neuropathy, CAD s/p CABG, presented with weakness, shortness of breath and upper chest pain one day in duration. Patient reports having similar event about a week prior to this admission.  Assessment & Plan:  Weakness and diaphoresis - In the setting of chest pain, unclear etiology  - BP noted to be significantly elevated with systolic in [id number], suspect this could be contributing to patient's symptoms - I do not see that any troponins have been cycled since admission and since patient denies chest pain this morning will defer this decision to cardiologist - Last echocardiogram in 2014 was indicative of normal EF of 55% - We'll check orthostatic vitals as well as repeat echocardiogram - Appreciate cardiology team assistance, we'll follow-up on recommendations - Continue aspirin - Admission note indicates continuation of beta blockers but I don't see this on patient's medical list, we'll discuss with cardiology - Please see management of hypertension below  Hypertensive urgency - Likely contributing to patient's symptoms - Patient is on Norvasc, Lasix, valsartan at home - Her systolic blood pressure still in 180s - I will continue Norvasc and Lasix for now - I will hold losartan given renal insufficiency and hyperkalemia - Allow hydralazine as needed for now  Acute on chronic kidney disease stage III - Review of record indicates creatinine as high as 1.27 - 1.50, 1 year ago with GFR of 40s - Creatinine is higher on this admission, etiology not entirely clear, could be related to use of ARB, metformin versus progression of hypertensive/diabetic kidney disease - We will hold losartan for  now - Repeat BMP in the morning  Hyperkalemia - Hold ace-i or arb's for now - Repeat BMP in the morning  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   Objective: Vitals:   05/24/16 0801 05/24/16 0827 05/24/16 0828 05/24/16 0936  BP: (!) 202/44 (!) 180/52 (!) 180/52 (!) 182/60  Pulse: (!) 58     Resp: 20     Temp: 98.1 F (36.7 C)     TempSrc: Oral     SpO2: 100%     Weight:      Height:        Intake/Output Summary (Last 24 hours) at 05/24/16 1032 Last data filed at 05/24/16 0940  Gross per 24 hour  Intake           500.83 ml  Output             1250 ml  Net          -749.17 ml   Filed Weights   05/23/16 1242 05/23/16 1653 05/24/16 [id number]  Weight: 62.1 kg (137 lb) 62.6 kg (138 lb 1.6 oz) 62.4 kg (137 lb 7.5 oz)    Examination:  General exam: Appears calm and comfortable  Respiratory system: Respiratory effort normal. Cardiovascular system: RRR. No JVD, rubs, gallops or clicks. No pedal edema. Gastrointestinal system: Abdomen is nondistended, soft and nontender. No organomegaly or masses felt.  Central nervous system: Alert and oriented. No focal neurological deficits.  Data Reviewed: I have personally reviewed following labs and imaging studies  CBC:  Recent Labs Lab 05/23/16 1252  WBC 7.2  HGB 10.6*  HCT 32.4*  MCV 95.6  PLT 0000000   Basic Metabolic Panel:  Recent Labs Lab 05/23/16 1252  05/23/16 2009  NA 137  --   K 5.6* 5.9*  CL 108  --   CO2 18*  --   GLUCOSE 192*  --   BUN 28*  --   CREATININE 1.42*  --   CALCIUM 9.0  --    HbA1C:  Recent Labs  05/23/16 1712  HGBA1C 6.5*   CBG:  Recent Labs Lab 05/23/16 1708 05/24/16 0730  GLUCAP 105* 122*   Radiology Studies: Dg Chest 2 View  Result Date: 05/23/2016 CLINICAL DATA:  Chest heaviness. EXAM: CHEST  2 VIEW COMPARISON:  06/25/2014 FINDINGS: Postsurgical changes from CABG stable. The cardiac silhouette is mildly enlarged. Mediastinal contours appear intact. There is no evidence of focal airspace consolidation, pleural effusion or pneumothorax. Osseous structures are without acute abnormality. Soft tissues are grossly normal. IMPRESSION: Mildly enlarged cardiac silhouette. Electronically Signed   By: Fidela Salisbury M.D.   On: 05/23/2016 13:19   Scheduled Meds: . amLODipine  10 mg Oral q morning - 10a  . furosemide  20 mg Oral Daily  . gabapentin  300 mg Oral BID  . heparin  5,000 Units Subcutaneous Q8H  . insulin aspart  0-9 Units Subcutaneous TID WC  . levothyroxine  50 mcg Oral QAC breakfast  . pantoprazole  40 mg Oral Daily  . senna  1 tablet Oral BID  . sodium chloride flush  3 mL Intravenous Q12H   Continuous Infusions:    LOS: 0 days   Time spent: 20 minutes   Faye Ramsay, MD [facility] Pager [phone]  If 7PM-7AM, please contact night-coverage [email or web address] Password [facility] 05/24/2016, 10:32 AM

## 2016-05-24 NOTE — Progress Notes (Signed)
Patient admitted with SOB, high blood pressure, some chest pain, Type 2 DM. Home meds: Lantus 14 units daily, Janumet May need to add Lantus to medication regimen if patient is to remain as patient overnight. May need to consider patient continuing on Metformin due to GFR of 35 and renal function. Harvel Ricks RN BSN CDE

## 2016-05-24 NOTE — Progress Notes (Signed)
    Talked with Nuc Med, and informed that the patient will need to wait 48 hours between VQ scan and Lexiscan. Orders changed to reflect this.  Reino Bellis NP-C

## 2016-05-25 ENCOUNTER — Other Ambulatory Visit: Payer: Self-pay | Admitting: Physician Assistant

## 2016-05-25 ENCOUNTER — Observation Stay (HOSPITAL_BASED_OUTPATIENT_CLINIC_OR_DEPARTMENT_OTHER): Payer: Medicare Other

## 2016-05-25 DIAGNOSIS — E038 Other specified hypothyroidism: Secondary | ICD-10-CM | POA: Diagnosis not present

## 2016-05-25 DIAGNOSIS — R55 Syncope and collapse: Secondary | ICD-10-CM

## 2016-05-25 DIAGNOSIS — D638 Anemia in other chronic diseases classified elsewhere: Secondary | ICD-10-CM | POA: Diagnosis not present

## 2016-05-25 DIAGNOSIS — D649 Anemia, unspecified: Secondary | ICD-10-CM

## 2016-05-25 DIAGNOSIS — Z951 Presence of aortocoronary bypass graft: Secondary | ICD-10-CM | POA: Diagnosis not present

## 2016-05-25 DIAGNOSIS — R079 Chest pain, unspecified: Secondary | ICD-10-CM

## 2016-05-25 DIAGNOSIS — I10 Essential (primary) hypertension: Secondary | ICD-10-CM | POA: Diagnosis not present

## 2016-05-25 DIAGNOSIS — E039 Hypothyroidism, unspecified: Secondary | ICD-10-CM

## 2016-05-25 DIAGNOSIS — E785 Hyperlipidemia, unspecified: Secondary | ICD-10-CM | POA: Diagnosis not present

## 2016-05-25 DIAGNOSIS — R0602 Shortness of breath: Secondary | ICD-10-CM | POA: Diagnosis not present

## 2016-05-25 LAB — CBC
HCT: 29.6 % — ABNORMAL LOW (ref 36.0–46.0)
Hemoglobin: 9.8 g/dL — ABNORMAL LOW (ref 12.0–15.0)
MCH: 31.2 pg (ref 26.0–34.0)
MCHC: 33.1 g/dL (ref 30.0–36.0)
MCV: 94.3 fL (ref 78.0–100.0)
Platelets: 176 10*3/uL (ref 150–400)
RBC: 3.14 MIL/uL — ABNORMAL LOW (ref 3.87–5.11)
RDW: 13.3 % (ref 11.5–15.5)
WBC: 6.8 10*3/uL (ref 4.0–10.5)

## 2016-05-25 LAB — BASIC METABOLIC PANEL
Anion gap: 9 (ref 5–15)
BUN: 30 mg/dL — ABNORMAL HIGH (ref 6–20)
CO2: 21 mmol/L — ABNORMAL LOW (ref 22–32)
Calcium: 8.7 mg/dL — ABNORMAL LOW (ref 8.9–10.3)
Chloride: 107 mmol/L (ref 101–111)
Creatinine, Ser: 1.58 mg/dL — ABNORMAL HIGH (ref 0.44–1.00)
GFR calc Af Amer: 36 mL/min — ABNORMAL LOW (ref >60)
GFR calc non Af Amer: 31 mL/min — ABNORMAL LOW (ref >60)
Glucose, Bld: 135 mg/dL — ABNORMAL HIGH (ref 65–99)
Potassium: 5.1 mmol/L (ref 3.5–5.1)
Sodium: 137 mmol/L (ref 135–145)

## 2016-05-25 LAB — ECHOCARDIOGRAM COMPLETE
Height: 60 in
Weight: 2168 oz

## 2016-05-25 LAB — GLUCOSE, CAPILLARY
Glucose-Capillary: 130 mg/dL — ABNORMAL HIGH (ref 65–99)
Glucose-Capillary: 145 mg/dL — ABNORMAL HIGH (ref 65–99)

## 2016-05-25 MED ORDER — HYDRALAZINE HCL 50 MG PO TABS
75.0000 mg | ORAL_TABLET | Freq: Three times a day (TID) | ORAL | Status: DC
  Administered 2016-05-25: 75 mg via ORAL
  Filled 2016-05-25: qty 1

## 2016-05-25 MED ORDER — HYDRALAZINE HCL 25 MG PO TABS: 75.0000 mg | ORAL_TABLET | Freq: Three times a day (TID) | ORAL | 0 refills | Status: DC

## 2016-05-25 NOTE — Progress Notes (Signed)
  Echocardiogram 2D Echocardiogram has been performed.  Jennette Dubin 05/25/2016, 8:59 AM

## 2016-05-25 NOTE — Discharge Instructions (Signed)
Furosemide tablets What is this medicine? FUROSEMIDE (fyoor [place] se mide) is a diuretic. It helps you make more urine and to lose salt and excess water from your body. This medicine is used to treat high blood pressure, and edema or swelling from heart, kidney, or liver disease. This medicine may be used for other purposes; ask your health care provider or pharmacist if you have questions. What should I tell my health care provider before I take this medicine? They need to know if you have any of these conditions: -abnormal blood electrolytes -diarrhea or vomiting -gout -heart disease -kidney disease, small amounts of urine, or difficulty passing urine -liver disease -thyroid disease -an unusual or allergic reaction to furosemide, sulfa drugs, other medicines, foods, dyes, or preservatives -pregnant or trying to get pregnant -breast-feeding How should I use this medicine? Take this medicine by mouth with a glass of water. Follow the directions on the prescription label. You may take this medicine with or without food. If it upsets your stomach, take it with food or milk. Do not take your medicine more often than directed. Remember that you will need to pass more urine after taking this medicine. Do not take your medicine at a time of day that will cause you problems. Do not take at bedtime. Talk to your pediatrician regarding the use of this medicine in children. While this drug may be prescribed for selected conditions, precautions do apply. Overdosage: If you think you have taken too much of this medicine contact a poison control center or emergency room at once. NOTE: This medicine is only for you. Do not share this medicine with others. What if I miss a dose? If you miss a dose, take it as soon as you can. If it is almost time for your next dose, take only that dose. Do not take double or extra doses. What may interact with this medicine? -aspirin and aspirin-like medicines -certain  antibiotics -chloral hydrate -cisplatin -cyclosporine -digoxin -diuretics -laxatives -lithium -medicines for blood pressure -medicines that relax muscles for surgery -methotrexate -NSAIDs, medicines for pain and inflammation like ibuprofen, naproxen, or indomethacin -phenytoin -steroid medicines like prednisone or cortisone -sucralfate -thyroid hormones This list may not describe all possible interactions. Give your health care provider a list of all the medicines, herbs, non-prescription drugs, or dietary supplements you use. Also tell them if you smoke, drink alcohol, or use illegal drugs. Some items may interact with your medicine. What should I watch for while using this medicine? Visit your doctor or health care professional for regular checks on your progress. Check your blood pressure regularly. Ask your doctor or health care professional what your blood pressure should be, and when you should contact him or her. If you are a diabetic, check your blood sugar as directed. You may need to be on a special diet while taking this medicine. Check with your doctor. Also, ask how many glasses of fluid you need to drink a day. You must not get dehydrated. You may get drowsy or dizzy. Do not drive, use machinery, or do anything that needs mental alertness until you know how this drug affects you. Do not stand or sit up quickly, especially if you are an older patient. This reduces the risk of dizzy or fainting spells. Alcohol can make you more drowsy and dizzy. Avoid alcoholic drinks. This medicine can make you more sensitive to the sun. Keep out of the sun. If you cannot avoid being in the sun, wear protective clothing and  use sunscreen. Do not use sun lamps or tanning beds/booths. What side effects may I notice from receiving this medicine? Side effects that you should report to your doctor or health care professional as soon as possible: -blood in urine or stools -dry mouth -fever or  chills -hearing loss or ringing in the ears -irregular heartbeat -muscle pain or weakness, cramps -skin rash -stomach upset, pain, or nausea -tingling or numbness in the hands or feet -unusually weak or tired -vomiting or diarrhea -yellowing of the eyes or skin Side effects that usually do not require medical attention (report to your doctor or health care professional if they continue or are bothersome): -headache -loss of appetite -unusual bleeding or bruising This list may not describe all possible side effects. Call your doctor for medical advice about side effects. You may report side effects to FDA at 1-800-FDA-1088. Where should I keep my medicine? Keep out of the reach of children. Store at room temperature between 15 and 30 degrees C (59 and 86 degrees F). Protect from light. Throw away any unused medicine after the expiration date. NOTE: This sheet is a summary. It may not cover all possible information. If you have questions about this medicine, talk to your doctor, pharmacist, or health care provider.    2016, [employer]vier/Gold Standard. (2015-01-01 13:49:50)

## 2016-05-25 NOTE — Discharge Summary (Signed)
Physician Discharge Summary  [name] [id number] DOB: [birth year] DOA: 05/23/2016  PCP: Ileana Roup, MD  Admit date: 05/23/2016 Discharge date: 05/25/2016  Recommendations for Outpatient Follow-up:  1. No change in medications on discharge 2. Outpt nuclear test per cardio   Discharge Diagnoses:  Active Problems:   GERD   Hyperlipidemia LDL goal <70   S/P CABG (coronary artery bypass graft), 12/04/11   HTN (hypertension)   Diabetic peripheral neuropathy associated with type 1 diabetes mellitus (HCC)   Chest tightness   Shortness of breath   Pre-syncope   Pain in the chest   Renal insufficiency    Discharge Condition: stable   Diet recommendation: as tolerated   History of present illness:  75 y.o.femalewith HTN (recently changed from Cozaar to valsartan), HLD, CKD, DM with neuropathy, CAD s/p CABG who presentedwith weakness, shortness of breath and upper chest painfor 1 day PTA. She was seen by cardiology in consultation.  Hospital Course:    Assessment & Plan:   Weakness and diaphoresis - Unclear etiology - 2 D ECHO on this admission with normal EF - V/Q scan - low probability for PE - She feels better this am - Continue aspirin - Ok for discharge per cardio  Hypertensive urgency - Likely contributing to patient's symptoms - Continue home meds  Acute on chronic kidney disease stage III - Review of record indicates creatinine as high as 1.27 - 1.50, 1 year ago with GFR of about 40 - Creatinine is higher on this admission, possibly from lasix, losartan - Losartan placed on hold - Cr stable at 1.57, 1.58  Hyperkalemia - Hold ACEi or ARB's - Potassium WNL this am   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 and neuropathy with current insulin use - A1c on this admission 6.5 - Continue SSI - Continue gabapentin for neuropathy   Anemia of chronic disease, CKD - Stable hemoglobin  Hypothyroidism - Continue  Synthroid  DVT prophylaxis:Heparin SubQ Code Status:Full  Family Communication:Daughter at the bedside    Consultants:  Cardiology   Procedures:  ECHO - Normal EF with grade 1 DD  Antimicrobials:   None   Signed:  Leisa Lenz, MD  [facility] 05/25/2016, 3:08 PM  Pager #: [phone]  Time spent in minutes: less than 30 minutes    Discharge Exam: Vitals:   05/25/16 0009 05/25/16 0538  BP: (!) 135/39 (!) 162/47  Pulse: (!) 57 (!) 58  Resp: 18 18  Temp: 98.1 F (36.7 C) 97.9 F (36.6 C)   Vitals:   05/24/16 1611 05/24/16 2000 05/25/16 0009 05/25/16 0538  BP: (!) 154/43 (!) 180/43 (!) 135/39 (!) 162/47  Pulse: (!) 59 (!) 59 (!) 57 (!) 58  Resp:  18 18 18   Temp:  98 F (36.7 C) 98.1 F (36.7 C) 97.9 F (36.6 C)  TempSrc:  Oral Oral Oral  SpO2:  100% 99% 99%  Weight:    61.5 kg (135 lb 8 oz)  Height:        General: Pt is alert, follows commands appropriately, not in acute distress Cardiovascular: Regular rate and rhythm, S1/S2 +, no murmurs Respiratory: Clear to auscultation bilaterally, no wheezing, no crackles, no rhonchi Abdominal: Soft, non tender, non distended, bowel sounds +, no guarding Extremities: no edema, no cyanosis, pulses palpable bilaterally DP and PT Neuro: Grossly nonfocal  Discharge Instructions  Discharge Instructions    Call MD for:  difficulty breathing, headache or visual disturbances    Complete by:  As directed   Call MD for:  persistant nausea and vomiting    Complete by:  As directed   Call MD for:  redness, tenderness, or signs of infection (pain, swelling, redness, odor or green/yellow discharge around incision site)    Complete by:  As directed   Call MD for:  severe uncontrolled pain    Complete by:  As directed   Diet - low sodium heart healthy    Complete by:  As directed   Increase activity slowly    Complete by:  As directed       Medication List    TAKE these medications   amLODipine 10 MG  tablet Commonly known as:  NORVASC TAKE ONE TABLET BY MOUTH ONCE DAILY IN THE MORNING What changed:  See the new instructions.   esomeprazole 20 MG capsule Commonly known as:  NEXIUM Take 1 capsule (20 mg total) by mouth daily at 12 noon.   furosemide 20 MG tablet Commonly known as:  LASIX TAKE ONE TABLET BY MOUTH ONCE DAILY What changed:  See the new instructions.   gabapentin 300 MG capsule Commonly known as:  NEURONTIN Take 300 mg by mouth 2 (two) times daily.   LANTUS 100 UNIT/ML injection Generic drug:  insulin glargine Inject 14-16 Units into the skin every morning.   levothyroxine 50 MCG tablet Commonly known as:  SYNTHROID, LEVOTHROID Take 50 mcg by mouth daily before breakfast.   losartan 100 MG tablet Commonly known as:  COZAAR Take 100 mg by mouth at bedtime.   Magnesium 300 MG Caps Take 300 mg by mouth daily.   meclizine 25 MG tablet Commonly known as:  ANTIVERT Take 25 mg by mouth daily as needed for dizziness. For dizziness   ONE TOUCH ULTRA TEST test strip Generic drug:  glucose blood daily.   PRESERVISION AREDS 2 PO Take 1 tablet by mouth daily.   RA VITAMIN B-12 100 MCG tablet Generic drug:  cyanocobalamin Take 100 mcg by mouth daily.   sitaGLIPtin-metformin 50-1000 MG tablet Commonly known as:  JANUMET Take 1 tablet by mouth 2 (two) times daily with a meal.   SYSTANE OP Apply 1 drop to eye 2 (two) times daily.   valsartan 320 MG tablet Commonly known as:  DIOVAN Take 1 tablet (320 mg total) by mouth daily.   vitamin C 500 MG tablet Commonly known as:  ASCORBIC ACID Take 500 mg by mouth daily.      Follow-up Information    [facility] Follow up on 06/01/2016.   Why:  @ 12:45 pm. Nothing to eat or drink after midnight the night before. You can take all your medications with sips of water.  Contact information: [place]  [phone]       Murray Hodgkins, NP Follow up on 06/22/2016.   Specialties:  Nurse Practitioner, Cardiology, Radiology Why:  @ 3:30pm Contact information: [address] [place] [address] [phone]            The results of significant diagnostics from this hospitalization (including imaging, microbiology, ancillary and laboratory) are listed below for reference.    Significant Diagnostic Studies: Dg Chest 2 View  Result Date: 05/23/2016 CLINICAL DATA:  Chest heaviness. EXAM: CHEST  2 VIEW COMPARISON:  06/25/2014 FINDINGS: Postsurgical changes from CABG stable. The cardiac silhouette is mildly enlarged. Mediastinal contours appear intact. There is no evidence of focal airspace consolidation, pleural effusion or pneumothorax.  Osseous structures are without acute abnormality. Soft tissues are grossly normal. IMPRESSION: Mildly enlarged cardiac silhouette. Electronically Signed   By: Fidela Salisbury M.D.   On: 05/23/2016 13:19  Nm Pulmonary Perf And Vent  Result Date: 05/24/2016 CLINICAL DATA:  Dyspnea, difficulty breathing, shaking, presyncopal, diaphoresis, weakness, onset of symptoms Sunday, history coronary artery disease post MI and CABG, diabetes mellitus, hypertension, bronchitis EXAM: NUCLEAR MEDICINE VENTILATION - PERFUSION LUNG SCAN TECHNIQUE: Ventilation images were obtained in multiple projections using inhaled aerosol Tc-53m DTPA. Perfusion images were obtained in multiple projections after intravenous injection of Tc-19m MAA. RADIOPHARMACEUTICALS:  32.2 mCi Technetium-84m DTPA aerosol inhalation and 4.32 mCi Technetium-73m MAA IV COMPARISON:  None; correlation chest radiograph 05/23/2016 FINDINGS: Ventilation: Central airway deposition of aerosol. Subsegmental ventilation defect lateral RIGHT upper lobe and minor fissure. Remaining ventilation pattern grossly normal. Swallowed aerosol within stomach. Perfusion: No segmental or subsegmental  perfusion defects identified. Chest radiograph:  Enlargement of cardiac silhouette post CABG. IMPRESSION: Normal perfusion lung scan. Small subsegmental ventilatory defect RIGHT upper lobe. Electronically Signed   By: Lavonia Dana M.D.   On: 05/24/2016 19:55    Microbiology: Recent Results (from the past 240 hour(s))  Culture, Urine     Status: Abnormal   Collection Time: 05/23/16  4:13 PM  Result Value Ref Range Status   Specimen Description URINE, RANDOM  Final   Special Requests NONE  Final   Culture MULTIPLE SPECIES PRESENT, SUGGEST RECOLLECTION (A)  Final   Report Status 05/24/2016 FINAL  Final     Labs: Basic Metabolic Panel:  Recent Labs Lab 05/23/16 1252 05/23/16 2009 05/24/16 1528 05/25/16 0414  NA 137  --  140 137  K 5.6* 5.9* 5.1 5.1  CL 108  --  109 107  CO2 18*  --  23 21*  GLUCOSE 192*  --  138* 135*  BUN 28*  --  25* 30*  CREATININE 1.42*  --  1.57* 1.58*  CALCIUM 9.0  --  9.2 8.7*   Liver Function Tests: No results for input(s): AST, ALT, ALKPHOS, BILITOT, PROT, ALBUMIN in the last 168 hours. No results for input(s): LIPASE, AMYLASE in the last 168 hours. No results for input(s): AMMONIA in the last 168 hours. CBC:  Recent Labs Lab 05/23/16 1252 05/25/16 0414  WBC 7.2 6.8  HGB 10.6* 9.8*  HCT 32.4* 29.6*  MCV 95.6 94.3  PLT 193 176   Cardiac Enzymes: No results for input(s): CKTOTAL, CKMB, CKMBINDEX, TROPONINI in the last 168 hours. BNP: BNP (last 3 results)  Recent Labs  05/23/16 1712  BNP 359.7*    ProBNP (last 3 results) No results for input(s): PROBNP in the last 8760 hours.  CBG:  Recent Labs Lab 05/24/16 1205 05/24/16 1615 05/24/16 2127 05/25/16 0641 05/25/16 1215  GLUCAP 149* 134* 211* 130* 145*

## 2016-05-25 NOTE — Evaluation (Signed)
Physical Therapy Evaluation Patient Details Name: [name] MRN: [id number] DOB: 1941-03-17 Today's Date: 05/25/2016   History of Present Illness  Pt is a 75 y/o F who presented to the ED with c/o increased weakness, SOB, and upper chest pain.  Pt's PMH includes CAD s/p CABG x3, CKD, diastolic CHF, MI, neuropathy, back surgery, Rt ankle fx surgery, Lt TKA.    Clinical Impression  Pt admitted with above diagnosis. Pt currently with functional limitations due to the deficits listed below (see PT Problem List). [name] presents with poor balance and a >10 year h/o vertigo.  Provided pt with gaze stabilization exercise and recommending OPPT vestibular follow up at d/c.  Spoke with pt about having someone with her when OOB as pt currently living alone and recommended acquiring a life alert service.  Pt will benefit from skilled PT to increase their independence and safety with mobility to allow discharge to the venue listed below.      Follow Up Recommendations Supervision for mobility/OOB;Outpatient PT (OPPT-Vesitbular)    Equipment Recommendations  None recommended by PT    Recommendations for Other Services       Precautions / Restrictions Precautions Precautions: Fall Restrictions Weight Bearing Restrictions: No      Mobility  Bed Mobility Overal bed mobility: Independent                Transfers Overall transfer level: Needs assistance Equipment used: None Transfers: Sit to/from Stand Sit to Stand: Supervision         General transfer comment: Supervision for safety  Ambulation/Gait Ambulation/Gait assistance: Min guard Ambulation Distance (Feet): 250 Feet Assistive device: None Gait Pattern/deviations: Step-through pattern Gait velocity: decreased   General Gait Details: Min guard as pt unsteady and staggering Lt and Rt with and without challenges to balance.  Pt reports this is her baseline.    Stairs Stairs: Yes Stairs assistance: Min  guard Stair Management: No rails;Step to pattern;Forwards Number of Stairs: 4 General stair comments: Pt hovering over rails due to instability but does not hold onto them.  Min guard as pt unsteady.  Wheelchair Mobility    Modified Rankin (Stroke Patients Only)       Balance Overall balance assessment: Needs assistance;History of Falls Sitting-balance support: No upper extremity supported;Feet supported Sitting balance-Leahy Scale: Good     Standing balance support: No upper extremity supported;During functional activity Standing balance-Leahy Scale: Fair                   Standardized Balance Assessment Standardized Balance Assessment : Dynamic Gait Index   Dynamic Gait Index Level Surface: Moderate Impairment Gait with Horizontal Head Turns: Moderate Impairment Gait with Vertical Head Turns: Moderate Impairment Gait and Pivot Turn: Mild Impairment Step Over Obstacle: Moderate Impairment Steps: Mild Impairment       Pertinent Vitals/Pain Pain Assessment: No/denies pain    Home Living Family/patient expects to be discharged to:: Private residence Living Arrangements: Alone Available Help at Discharge: Family;Available PRN/intermittently Type of Home: House Home Access: Stairs to enter Entrance Stairs-Rails: None Entrance Stairs-Number of Steps: 2 Home Layout:  (storage in basement) Home Equipment: Walker - 2 wheels;Cane - single point;Shower seat;Bedside commode;Wheelchair - manual      Prior Function Level of Independence: Independent         Comments: Pt Ind not using AD.  Reports ~3-4 falls in the past 6 months with long h/o vertigo (see general comments below).       Hand Dominance  Dominant Hand: Right    Extremity/Trunk Assessment   Upper Extremity Assessment: Overall WFL for tasks assessed           Lower Extremity Assessment: Overall WFL for tasks assessed         Communication   Communication: No difficulties  Cognition  Arousal/Alertness: Awake/alert Behavior During Therapy: WFL for tasks assessed/performed Overall Cognitive Status: Within Functional Limits for tasks assessed                      General Comments General comments (skin integrity, edema, etc.): SpO2 remains at or above 90% on RA throughout session.  Encouraged pt and daughter to look into acquiring a life alert button, pt has had one in the past.  Pt reports >10 year h/o vertigo and takes medication for this prn.  She denies ever seeing a vestibular PT for this.  Poor tracking Rt lower quadrant and pt reports that her symptoms are worse with head turns.     Exercises Other Exercises Other Exercises: Gaze stabilization exercises sitting EOB.  Pt looking straight ahead at object while rotating head Lt and Rt.  Encouraged pt to begin doing this exercise 2x/day, pt and daughter vebalized understanding.      Assessment/Plan    PT Assessment Patient needs continued PT services  PT Diagnosis Difficulty walking   PT Problem List Decreased balance;Decreased safety awareness;Decreased knowledge of use of DME  PT Treatment Interventions Other (comment);DME instruction;Gait training;Stair training;Functional mobility training;Therapeutic activities;Therapeutic exercise;Balance training;Patient/family education (vestibular interventions)   PT Goals (Current goals can be found in the Care Plan section) Acute Rehab PT Goals Patient Stated Goal: to go home PT Goal Formulation: With patient/family Time For Goal Achievement: 06/08/16 Potential to Achieve Goals: Good    Frequency Min 3X/week   Barriers to discharge Inaccessible home environment;Decreased caregiver support Lives alone with steps to enter home    Co-evaluation               End of Session Equipment Utilized During Treatment: Gait belt Activity Tolerance: Patient tolerated treatment well Patient left: in bed;with call bell/phone within reach;with family/visitor  present Nurse Communication: Mobility status    Functional Assessment Tool Used: Clinical Judgement Functional Limitation: Mobility: Walking and moving around Mobility: Walking and Moving Around Current Status [id number]): At least 1 percent but less than 20 percent impaired, limited or restricted Mobility: Walking and Moving Around Goal Status [phone]): At least 1 percent but less than 20 percent impaired, limited or restricted    Time: 0908-0940 PT Time Calculation (min) (ACUTE ONLY): 32 min   Charges:   PT Evaluation $PT Eval Low Complexity: 1 Procedure PT Treatments $Gait Training: 8-22 mins   PT G Codes:   PT G-Codes **NOT FOR INPATIENT CLASS** Functional Assessment Tool Used: Clinical Judgement Functional Limitation: Mobility: Walking and moving around Mobility: Walking and Moving Around Current Status [id number]): At least 1 percent but less than 20 percent impaired, limited or restricted Mobility: Walking and Moving Around Goal Status [phone]): At least 1 percent but less than 20 percent impaired, limited or restricted   Collie Siad PT, DPT  Pager: [phone] Phone: [phone] 05/25/2016, 12:50 PM

## 2016-05-25 NOTE — Progress Notes (Signed)
Patient Name: [name] Date of Encounter: 05/25/2016     Active Problems:   GERD   Hyperlipidemia LDL goal <70   S/P CABG (coronary artery bypass graft), 12/04/11   HTN (hypertension)   Diabetic peripheral neuropathy associated with type 1 diabetes mellitus (HCC)   Chest tightness   Shortness of breath   Pre-syncope   Pain in the chest   Renal insufficiency    SUBJECTIVE  Very eager to go home. No recurrent chest pain and breathing better.   CURRENT MEDS . amLODipine  10 mg Oral q morning - 10a  . furosemide  20 mg Oral Daily  . gabapentin  300 mg Oral BID  . heparin  5,000 Units Subcutaneous Q8H  . hydrALAZINE  50 mg Oral BID  . insulin aspart  0-9 Units Subcutaneous TID WC  . levothyroxine  50 mcg Oral QAC breakfast  . pantoprazole  40 mg Oral Daily  . senna  1 tablet Oral BID  . sodium chloride flush  3 mL Intravenous Q12H    OBJECTIVE  Vitals:   05/24/16 1611 05/24/16 2000 05/25/16 0009 05/25/16 0538  BP: (!) 154/43 (!) 180/43 (!) 135/39 (!) 162/47  Pulse: (!) 59 (!) 59 (!) 57 (!) 58  Resp:  18 18 18   Temp:  98 F (36.7 C) 98.1 F (36.7 C) 97.9 F (36.6 C)  TempSrc:  Oral Oral Oral  SpO2:  100% 99% 99%  Weight:    135 lb 8 oz (61.5 kg)  Height:        Intake/Output Summary (Last 24 hours) at 05/25/16 1338 Last data filed at 05/25/16 1214  Gross per 24 hour  Intake              120 ml  Output             1400 ml  Net            -1280 ml   Filed Weights   05/23/16 1653 05/24/16 0610 05/25/16 0538  Weight: 138 lb 1.6 oz (62.6 kg) 137 lb 7.5 oz (62.4 kg) 135 lb 8 oz (61.5 kg)    PHYSICAL EXAM  General: Pleasant, NAD. Neuro: Alert and oriented X 3. Moves all extremities spontaneously. Psych: Normal affect. HEENT:  Normal  Neck: Supple without bruits or JVD. Lungs:  Resp regular and unlabored, CTA. Heart: RRR no s3, s4, or murmurs. Abdomen: Soft, non-tender, non-distended, BS + x 4.  Extremities: No clubbing, cyanosis or edema.  DP/PT/Radials 2+ and equal bilaterally.  Accessory Clinical Findings  CBC  Recent Labs  05/23/16 1252 05/25/16 0414  WBC 7.2 6.8  HGB 10.6* 9.8*  HCT 32.4* 29.6*  MCV 95.6 94.3  PLT 193 0000000   Basic Metabolic Panel  Recent Labs  05/24/16 1528 05/25/16 0414  NA 140 137  K 5.1 5.1  CL 109 107  CO2 23 21*  GLUCOSE 138* 135*  BUN 25* 30*  CREATININE 1.57* 1.58*  CALCIUM 9.2 8.7*   D-Dimer  Recent Labs  05/23/16 1754  DDIMER 2.39*   Hemoglobin A1C  Recent Labs  05/23/16 1712  HGBA1C 6.5*    TELE  Sinus brady  Radiology/Studies  Dg Chest 2 View  Result Date: 05/23/2016 CLINICAL DATA:  Chest heaviness. EXAM: CHEST  2 VIEW COMPARISON:  06/25/2014 FINDINGS: Postsurgical changes from CABG stable. The cardiac silhouette is mildly enlarged. Mediastinal contours appear intact. There is no evidence of focal airspace consolidation, pleural effusion or pneumothorax. Osseous structures  are without acute abnormality. Soft tissues are grossly normal. IMPRESSION: Mildly enlarged cardiac silhouette. Electronically Signed   By: Fidela Salisbury M.D.   On: 05/23/2016 13:19  Nm Pulmonary Perf And Vent  Result Date: 05/24/2016 CLINICAL DATA:  Dyspnea, difficulty breathing, shaking, presyncopal, diaphoresis, weakness, onset of symptoms Sunday, history coronary artery disease post MI and CABG, diabetes mellitus, hypertension, bronchitis EXAM: NUCLEAR MEDICINE VENTILATION - PERFUSION LUNG SCAN TECHNIQUE: Ventilation images were obtained in multiple projections using inhaled aerosol Tc-64m DTPA. Perfusion images were obtained in multiple projections after intravenous injection of Tc-54m MAA. RADIOPHARMACEUTICALS:  32.2 mCi Technetium-57m DTPA aerosol inhalation and 4.32 mCi Technetium-72m MAA IV COMPARISON:  None; correlation chest radiograph 05/23/2016 FINDINGS: Ventilation: Central airway deposition of aerosol. Subsegmental ventilation defect lateral RIGHT upper lobe and minor  fissure. Remaining ventilation pattern grossly normal. Swallowed aerosol within stomach. Perfusion: No segmental or subsegmental perfusion defects identified. Chest radiograph:  Enlargement of cardiac silhouette post CABG. IMPRESSION: Normal perfusion lung scan. Small subsegmental ventilatory defect RIGHT upper lobe. Electronically Signed   By: Lavonia Dana M.D.   On: 05/24/2016 19:55   2D ECHO: 05/25/2016 LV EF: 60% -   65% Study Conclusions - Left ventricle: The cavity size was normal. Wall thickness was   normal. Systolic function was normal. The estimated ejection   fraction was in the range of 60% to 65%. Wall motion was normal;   there were no regional wall motion abnormalities. Doppler   parameters are consistent with abnormal left ventricular   relaxation (grade 1 diastolic dysfunction). The E/e&' ratio is   between 8-15, suggesting indeterminate LV filling pressure. - Aortic valve: Trileaflet. Sclerosis without stenosis.   Transvalvular velocity was minimally increased. There was no   stenosis. There was no regurgitation. - Mitral valve: Mildly thickened leaflets . There was trivial   regurgitation. - Left atrium: The atrium was normal in size. - Tricuspid valve: There was no significant regurgitation. - Inferior vena cava: The vessel was normal in size. The   respirophasic diameter changes were in the normal range (>= 50%),   consistent with normal central venous pressure. Impressions: - Compared to a prior echo in 2014, there are no significant   changes.   ASSESSMENT AND PLAN  75 yo female with PMH of CAD s/p CABG x3 LIMA to LAD, SVG to OM and SVG to right coronary 2013, hypertension, diabetes, hyperlipidemia, CKD, GERD, and diastolic CHF who presented to the [facility] ED on 05/23/16 with complaints of increased weakness, shortness of breath and chest pressure.   Matilde Bash:  In the ED Trop neg x1. Also noted to be hypertensive with systolic BP [id number]. Chest x-ray  negative for edema. 2014 2D echo with normal EF and grade 1 DD. Repeat 2D ECHO with EF 60-65%, G1DD and no significant changes since previous study. Continue lasix 20mg  daily.   Elevated D-Dimer: V/Q scan negative for PE.  HTN: Bp remains elevated. Was on valsartan 320mg  and amlodipine 10mg  at home. Continue amlodipine 10mg . ARB discontinued due to hyperkalemia and [name]. Hydralazine PO 50mg  TID added. I will further increase this to 75mg  TID  CAD s/p CABG x3 LIMA to LAD, SVG to OM and SVG to right coronary (2013): No further reports of chest pressure since admission. Plan for myoview, but need to wait 48 hours between VQ scan and Lexiscan. Patient eager to go home, so will arrange for outpatient nuc and follow up  CKD III:  Creat 1.58 yesterday, GFR 36. BMET pending today  Hyperkalemia: now resolved off ARB.   DMT2: Hg A1C 6.5. Per IM  Signed, Angelena Form PA-C  Pager [phone]  Patient seen and examined. Agree with assessment and plan. VQ negative for PE.  Echo today shows EF 60 - 65% without wall motion abnormality; mild AV sclerosis without stenosis and trivial MR unchanged from 2014.   No chest pain.  OK to dc today and arrange for outpatient nuclear study and f/u with Dr. Debara Pickett.   Troy Sine, MD, [facility] 05/25/2016 3:05 PM

## 2016-05-25 NOTE — Progress Notes (Signed)
Patient ID: [name], female   DOB: 1941/01/06, 75 y.o.   MRN: [id number]  PROGRESS NOTE    [name]  [id number] DOB: 1940/11/28 DOA: 05/23/2016  PCP: Ileana Roup, MD   Brief Narrative:  75 y.o.femalewith HTN (recently changed from Cozaar to valsartan), HLD, CKD, DM with neuropathy, CAD s/p CABG who presented with weakness, shortness of breath and upper chest pain for 1 day PTA. She was seen by cardiology in consultation.  Assessment & Plan:   Weakness and diaphoresis - Unclear etiology 2 D ECHO on this admission with normal EF - V/Q scan - low probability for PE - She feels better this am - Cardio plans for nuclear scan tomorrow - Continue aspirin  Hypertensive urgency - Likely contributing to patient's symptoms - BP 162/47 - She is on Norvasc, hydralazine, lasix  Acute on chronic kidney disease stage III - Review of record indicates creatinine as high as 1.27 - 1.50, 1 year ago with GFR of about 40 - Creatinine is higher on this admission, possibly from lasix, losartan - Losartan placed on hold - Cr stable at 1.57, 1.58  Hyperkalemia - Hold ACEi or ARB's - Potassium WNL this am   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   Objective: Vitals:   05/24/16 1611 05/24/16 2000 05/25/16 0009 05/25/16 0538  BP: (!) 154/43 (!) 180/43 (!) 135/39 (!) 162/47  Pulse: (!) 59 (!) 59 (!) 57 (!) 58  Resp:  18 18 18   Temp:  98 F  (36.7 C) 98.1 F (36.7 C) 97.9 F (36.6 C)  TempSrc:  Oral Oral Oral  SpO2:  100% 99% 99%  Weight:    61.5 kg (135 lb 8 oz)  Height:        Intake/Output Summary (Last 24 hours) at 05/25/16 1319 Last data filed at 05/25/16 1214  Gross per 24 hour  Intake              120 ml  Output             1400 ml  Net            -1280 ml   Filed Weights   05/23/16 1653 05/24/16 0610 05/25/16 0538  Weight: 62.6 kg (138 lb 1.6 oz) 62.4 kg (137 lb 7.5 oz) 61.5 kg (135 lb 8 oz)    Examination:  General exam: Appears calm and comfortable  Respiratory system: Clear to auscultation. Respiratory effort normal. Cardiovascular system: S1 & S2 heard, RRR. No JVD Gastrointestinal system: Abdomen is nondistended, soft and nontender.  Central nervous system: Alert and oriented. No focal neurological deficits. Extremities: Symmetric 5 x 5 power. Skin: No rashes, lesions or ulcers Psychiatry: Judgement and insight appear normal. Mood & affect appropriate.   Data Reviewed: I have personally reviewed following labs and imaging studies  CBC:  Recent Labs Lab 05/23/16 1252 05/25/16 0414  WBC 7.2 6.8  HGB 10.6* 9.8*  HCT 32.4* 29.6*  MCV 95.6 94.3  PLT 193 0000000   Basic Metabolic Panel:  Recent Labs Lab 05/23/16 1252 05/23/16 2009 05/24/16 1528 05/25/16 0414  NA 137  --  140 137  K 5.6* 5.9* 5.1 5.1  CL 108  --  109 107  CO2 18*  --  23 21*  GLUCOSE 192*  --  138* 135*  BUN 28*  --  25* 30*  CREATININE 1.42*  --  1.57* 1.58*  CALCIUM 9.0  --  9.2 8.7*   GFR: Estimated Creatinine Clearance: 24.8 mL/min (by C-G formula based on SCr of 1.58 mg/dL). Liver Function Tests: No results for input(s): AST, ALT, ALKPHOS, BILITOT, PROT, ALBUMIN in the last 168 hours. No results for input(s): LIPASE, AMYLASE in the last 168 hours. No results for input(s): AMMONIA in the last 168 hours. Coagulation Profile: No results for input(s): INR, PROTIME in the last 168 hours. Cardiac Enzymes: No  results for input(s): CKTOTAL, CKMB, CKMBINDEX, TROPONINI in the last 168 hours. BNP (last 3 results) No results for input(s): PROBNP in the last 8760 hours. HbA1C:  Recent Labs  05/23/16 1712  HGBA1C 6.5*   CBG:  Recent Labs Lab 05/24/16 1205 05/24/16 1615 05/24/16 2127 05/25/16 0641 05/25/16 1215  GLUCAP 149* 134* 211* 130* 145*   Lipid Profile: No results for input(s): CHOL, HDL, LDLCALC, TRIG, CHOLHDL, LDLDIRECT in the last 72 hours. Thyroid Function Tests: No results for input(s): TSH, T4TOTAL, FREET4, T3FREE, THYROIDAB in the last 72 hours. Anemia Panel: No results for input(s): VITAMINB12, FOLATE, FERRITIN, TIBC, IRON, RETICCTPCT in the last 72 hours. Urine analysis:    Component Value Date/Time   COLORURINE YELLOW 01/04/2012 1105   APPEARANCEUR CLOUDY (A) 01/04/2012 1105   LABSPEC 1.015 01/04/2012 1105   PHURINE 5.0 01/04/2012 1105   GLUCOSEU NEGATIVE 01/04/2012 1105   HGBUR NEGATIVE 01/04/2012 1105   BILIRUBINUR NEGATIVE 01/04/2012 1105   KETONESUR NEGATIVE 01/04/2012 1105   PROTEINUR NEGATIVE 01/04/2012 1105   UROBILINOGEN 0.2 01/04/2012 1105   NITRITE NEGATIVE 01/04/2012 1105   LEUKOCYTESUR MODERATE (A) 01/04/2012 1105   Sepsis Labs: @LABRCNTIP (procalcitonin:4,lacticidven:4)    Recent Results (from the past 240 hour(s))  Culture, Urine     Status: Abnormal   Collection Time: 05/23/16  4:13 PM  Result Value Ref Range Status   Specimen Description URINE, RANDOM  Final   Special Requests NONE  Final   Culture MULTIPLE SPECIES PRESENT, SUGGEST RECOLLECTION (A)  Final   Report Status 05/24/2016 FINAL  Final      Radiology Studies: Dg Chest 2 View Result Date: 05/23/2016 CLINICAL DATA: Mildly enlarged cardiac silhouette. Electronically Signed   By: Fidela Salisbury M.D.   On: 05/23/2016 13:19  Nm Pulmonary Perf And Vent Result Date: 05/24/2016 CLINICAL DATA:  Normal perfusion lung scan. Small subsegmental ventilatory defect RIGHT upper lobe.  Electronically Signed   By: Lavonia Dana M.D.   On: 05/24/2016 19:55        Scheduled Meds: . amLODipine  10 mg Oral q morning - 10a  . furosemide  20 mg Oral Daily  . gabapentin  300 mg Oral BID  . heparin  5,000 Units Subcutaneous Q8H  . hydrALAZINE  50 mg Oral BID  . insulin aspart  0-9 Units Subcutaneous TID WC  . levothyroxine  50 mcg Oral QAC breakfast  . pantoprazole  40 mg Oral Daily  . senna  1 tablet Oral BID  . sodium chloride flush  3 mL Intravenous Q12H   Continuous Infusions:    LOS: 0 days  Time spent: 25 minutes  Greater than 50% of the time spent on counseling and coordinating the care.   Leisa Lenz, MD [facility] Pager [phone]  If 7PM-7AM, please contact night-coverage [email or web address] Password [email or web address] 05/25/2016, 1:19 PM

## 2016-06-01 ENCOUNTER — Telehealth (HOSPITAL_COMMUNITY): Payer: Self-pay | Admitting: *Deleted

## 2016-06-01 ENCOUNTER — Telehealth (HOSPITAL_COMMUNITY): Payer: Self-pay | Admitting: Radiology

## 2016-06-01 NOTE — Telephone Encounter (Signed)
Patient given detailed instructions per Myocardial Perfusion Study Information Sheet for the test on 06/03/16 at 730. Patient notified to arrive 15 minutes early and that it is imperative to arrive on time for appointment to keep from having the test rescheduled.  If you need to cancel or reschedule your appointment, please call the office within 24 hours of your appointment. Failure to do so may result in a cancellation of your appointment, and a $50 no show fee. Patient verbalized understanding.[name]

## 2016-06-01 NOTE — Telephone Encounter (Signed)
Patient given detailed instructions per Myocardial Perfusion Study Information Sheet for the test on 06/03/16 at 0730. Patient notified to arrive 15 minutes early and that it is imperative to arrive on time for appointment to keep from having the test rescheduled.  If you need to cancel or reschedule your appointment, please call the office within 24 hours of your appointment. Failure to do so may result in a cancellation of your appointment, and a $50 no show fee. Patient verbalized understanding.Tonye Becket

## 2016-06-01 NOTE — Telephone Encounter (Signed)
Left message on voicemail in reference to upcoming appointment scheduled for 06/03/16. Phone number given for a call back so details instructions can be given. [place], [name]

## 2016-06-02 ENCOUNTER — Encounter (HOSPITAL_COMMUNITY): Payer: Medicare Other

## 2016-06-03 ENCOUNTER — Ambulatory Visit (HOSPITAL_COMMUNITY): Payer: Medicare Other | Attending: Cardiovascular Disease

## 2016-06-03 DIAGNOSIS — R079 Chest pain, unspecified: Secondary | ICD-10-CM | POA: Insufficient documentation

## 2016-06-03 DIAGNOSIS — E109 Type 1 diabetes mellitus without complications: Secondary | ICD-10-CM | POA: Diagnosis not present

## 2016-06-03 DIAGNOSIS — R0609 Other forms of dyspnea: Secondary | ICD-10-CM | POA: Insufficient documentation

## 2016-06-03 DIAGNOSIS — Z8249 Family history of ischemic heart disease and other diseases of the circulatory system: Secondary | ICD-10-CM | POA: Insufficient documentation

## 2016-06-03 DIAGNOSIS — I10 Essential (primary) hypertension: Secondary | ICD-10-CM | POA: Insufficient documentation

## 2016-06-03 DIAGNOSIS — R0602 Shortness of breath: Secondary | ICD-10-CM | POA: Diagnosis not present

## 2016-06-03 LAB — MYOCARDIAL PERFUSION IMAGING
LV dias vol: 87 mL (ref 46–106)
LV sys vol: 24 mL
Peak HR: 85 {beats}/min
RATE: 0.3
Rest HR: 71 {beats}/min
SDS: 3
SRS: 1
SSS: 4
TID: 1.15

## 2016-06-03 MED ORDER — TECHNETIUM TC 99M TETROFOSMIN IV KIT
11.0000 | PACK | Freq: Once | INTRAVENOUS | Status: AC | PRN
  Administered 2016-06-03: 11 via INTRAVENOUS
  Filled 2016-06-03: qty 11

## 2016-06-03 MED ORDER — AMINOPHYLLINE 25 MG/ML IV SOLN
75.0000 mg | Freq: Once | INTRAVENOUS | Status: AC
  Administered 2016-06-03: 75 mg via INTRAVENOUS

## 2016-06-03 MED ORDER — REGADENOSON 0.4 MG/5ML IV SOLN
0.4000 mg | Freq: Once | INTRAVENOUS | Status: AC
  Administered 2016-06-03: 0.4 mg via INTRAVENOUS

## 2016-06-03 MED ORDER — TECHNETIUM TC 99M TETROFOSMIN IV KIT
32.5000 | PACK | Freq: Once | INTRAVENOUS | Status: AC | PRN
  Administered 2016-06-03: 33 via INTRAVENOUS
  Filled 2016-06-03: qty 33

## 2016-06-22 ENCOUNTER — Ambulatory Visit (INDEPENDENT_AMBULATORY_CARE_PROVIDER_SITE_OTHER): Payer: Medicare Other | Admitting: Nurse Practitioner

## 2016-06-22 ENCOUNTER — Encounter: Payer: Self-pay | Admitting: Nurse Practitioner

## 2016-06-22 VITALS — BP 192/67 | HR 62 | Ht 60.0 in | Wt 140.6 lb

## 2016-06-22 DIAGNOSIS — N183 Chronic kidney disease, stage 3 unspecified: Secondary | ICD-10-CM

## 2016-06-22 DIAGNOSIS — E785 Hyperlipidemia, unspecified: Secondary | ICD-10-CM | POA: Insufficient documentation

## 2016-06-22 DIAGNOSIS — E119 Type 2 diabetes mellitus without complications: Secondary | ICD-10-CM | POA: Diagnosis not present

## 2016-06-22 DIAGNOSIS — I25810 Atherosclerosis of coronary artery bypass graft(s) without angina pectoris: Secondary | ICD-10-CM

## 2016-06-22 DIAGNOSIS — I119 Hypertensive heart disease without heart failure: Secondary | ICD-10-CM | POA: Insufficient documentation

## 2016-06-22 DIAGNOSIS — I5032 Chronic diastolic (congestive) heart failure: Secondary | ICD-10-CM | POA: Diagnosis not present

## 2016-06-22 DIAGNOSIS — N184 Chronic kidney disease, stage 4 (severe): Secondary | ICD-10-CM | POA: Insufficient documentation

## 2016-06-22 DIAGNOSIS — I110 Hypertensive heart disease with heart failure: Secondary | ICD-10-CM | POA: Diagnosis not present

## 2016-06-22 DIAGNOSIS — I2510 Atherosclerotic heart disease of native coronary artery without angina pectoris: Secondary | ICD-10-CM | POA: Insufficient documentation

## 2016-06-22 MED ORDER — CLONIDINE HCL 0.2 MG PO TABS: 0.2000 mg | ORAL_TABLET | Freq: Two times a day (BID) | ORAL | 6 refills | Status: DC

## 2016-06-22 NOTE — Progress Notes (Signed)
Office Visit    Patient Name: [name] Date of Encounter: 06/22/2016  Primary Care Provider:  Ileana Roup, MD Primary Cardiologist:  C. Hilty, MD   Chief Complaint    75 year old female with a history of CAD, hypertension, hyperlipidemia, diabetes, and diastolic heart failure, who presents for follow-up after recent hospitalization related to hypertensive urgency and heart failure.  Past Medical History    Past Medical History:  Diagnosis Date  . Acute renal failure, 01/03/12 01/03/2012  . Arthritis    "in my hands"  . Bronchitis   . Chronic diastolic CHF (congestive heart failure) ([place])    a. 05/2016 Echo: EF 60-65%, no rwma, Gr1 DD, Ao sclerosis w/o stenosis, triv MR.  . CKD (chronic kidney disease), stage III   . Coronary artery disease    a. 02/2007 [place] MV: low risk;  b. 11/2011 CABG x 3 (LIMA->LAD, VG->OM, VG->RCA);  c. 05/2016 MV: EF >65%, no isch/infarct.  . Depression   . Diabetes mellitus   . Diabetic neuropathy ([place])   . Diverticulosis   . Esophageal stricture   . GERD (gastroesophageal reflux disease)   . Hemorrhoids   . Hiatal hernia   . History of stomach ulcers 1970's  . Hyperlipidemia   . Hypertensive heart disease   . Hypothyroidism   . Neuropathy ([place])   . Pain    RIGHT KNEE PAIN - TORN RIGHT MEDIAL MENISCUS  . Pneumonia   . PONV (postoperative nausea and vomiting)   . S/P CABG (coronary artery bypass graft), 12/04/11 12/07/2011   LIMA to LAD, SVG to OM, SVG to RCA   Past Surgical History:  Procedure Laterality Date  . ABDOMINAL HYSTERECTOMY  1980's  . BACK SURGERY  2006   "cyst growing near my spine"  . CARDIAC CATHETERIZATION  12/02/2011   mild LV dysfunction with mod hypocontractility of mid-distal anterolateral wall; CAD w/ostial tapering of L [place] with 50% diffuse ostial narrowing of LAD, 99% eccentric focal prox LAD stenosis followed by 70% prox LAD stenosis after 1st diag, 20% mid LAD narrowing; 80% ostial-to-prox L Cfx  stenosis & 40-50% irregularity of RCA (Dr. Corky Downs)  . CATARACT EXTRACTION W/ INTRAOCULAR LENS  IMPLANT, BILATERAL  ~ 2010  . [place]  . CORONARY ARTERY BYPASS GRAFT  12/04/2011   Procedure: CORONARY ARTERY BYPASS GRAFTING (CABG);  Surgeon: Tharon Aquas Adelene Idler, MD;  Location: [facility];  Service: Open Heart Surgery;  Laterality: N/A;  CABG x three,  using left internal mammary artery, and right leg greater saphenous vein harvested endoscopically  . DILATION AND CURETTAGE OF UTERUS     "a couple times"  . FRACTURE SURGERY     "put pins both side right ankle"  . JOINT REPLACEMENT    . KNEE ARTHROSCOPY WITH MEDIAL MENISECTOMY Right 07/02/2014   Procedure: RIGHT KNEE ARTHROSCOPY WITH PARTIAL MEDIAL MENISTECTOMY, ABRASION CONDROPLASTYU OF PATELLA,ABRASION CONDROPLASTY OF MEDIAL FEMEROL CONDYL, MICROFRACTURE OF MEDIAL FEMEROL CONDYL;  Surgeon: Tobi Bastos, MD;  Location: [facility];  Service: Orthopedics;  Laterality: Right;  . LEFT HEART CATHETERIZATION WITH CORONARY ANGIOGRAM N/A 12/02/2011   Procedure: LEFT HEART CATHETERIZATION WITH CORONARY ANGIOGRAM;  Surgeon: Troy Sine, MD;  Location: [facility] CATH LAB;  Service: Cardiovascular;  Laterality: N/A;  Coronary angiogram, possible PCI  . TONSILLECTOMY  1949  . TOTAL KNEE ARTHROPLASTY  ~ 2006   left  . TRANSTHORACIC ECHOCARDIOGRAM  02/19/2013   EF 0000000, grade 1 diastolic dysfunction; mildly thickend/calcified AV  leaflets; mildly calcidied MV annulus; mild TR    Allergies  Allergies  Allergen Reactions  . Crestor  [Rosuvastatin Calcium]     Other reaction(s): tired and weak  . Epinephrine Other (See Comments)    Abnormal feeling. Dental exam/injection of local w/ epi.  Marland Kitchen Hydralazine     Nausea/gi upset   . Other     MANGO'S - WHELPS ALL OVER  . Sulfa Antibiotics     History of Present Illness    75 year old female with the above complex past medical history including a prior history of CAD status post CABG 3 in February 2013.  Other history includes hypertension, hyperlipidemia, diabetes, and stage III kidney disease. She was recently admitted to [facility] secondary to increasing dyspnea and weakness and was found to be markedly hypertensive. Renal function was also slightly worse while she was also hyperkalemic. ARB was discontinued and she was placed on hydralazine. This was titrated to 75 mg 3 times a day with some improvement in blood pressure. She did require diuresis and was eventually discharged home on Lasix 20 mg daily. She did have chest pressure in the setting of hypertension on admission. D-dimer was elevated however VQ scan was low probability. There were plans for stress testing while as an inpatient however because she had the VQ scan, she could not have a repeat nuclear imaging study. Therefore, she underwent Lexiscan Cardiolite stress testing on August 10, which was low risk.  Since her hospitalization, she has not had any chest pain or dyspnea but notes that she's been feeling somewhat anxious. Further, she doesn't feel as though she tolerates hydralazine. She was prescribed 25 mg 3 tabs 3 times a day and she feels as though taking all of those pills upsets her stomach and as result, she is only taking 2 pills in the morning and at lunch and then 3 pills at dinnertime. She reports compliance with her amlodipine. She is not always taking her Lasix as prescribed because she doesn't like that she is always running to the bathroom. She denies PND, orthopnea, dizziness, syncope, or early satiety. She has intermittent lower extremity edema, depending on whether or not she takes her Lasix.  Home Medications    Prior to Admission medications   Medication Sig Start Date End Date Taking? Authorizing Provider  amLODipine (NORVASC) 10 MG tablet TAKE ONE TABLET BY MOUTH ONCE DAILY IN THE MORNING 05/24/16  Yes [name], MD  cyanocobalamin (RA VITAMIN B-12) 100 MCG tablet Take 100 mcg by mouth daily.    Yes Historical  Provider, MD  esomeprazole (NEXIUM) 20 MG capsule Take 1 capsule (20 mg total) by mouth daily at 12 noon. 04/13/16  Yes Irene Shipper, MD  furosemide (LASIX) 20 MG tablet TAKE ONE TABLET BY MOUTH ONCE DAILY Patient taking differently: TAKE ONE TABLET BY MOUTH ONCE DAILY as needed for fluid 10/10/15  Yes [name], MD  gabapentin (NEURONTIN) 300 MG capsule Take 300 mg by mouth 2 (two) times daily.    Yes Historical Provider, MD  insulin glargine (LANTUS) 100 UNIT/ML injection Inject 14-16 Units into the skin every morning.    Yes Historical Provider, MD  levothyroxine (SYNTHROID, LEVOTHROID) 50 MCG tablet Take 50 mcg by mouth daily before breakfast.    Yes Historical Provider, MD  Magnesium 300 MG CAPS Take 300 mg by mouth daily.    Yes Historical Provider, MD  meclizine (ANTIVERT) 25 MG tablet Take 25 mg by mouth daily as needed for  dizziness. For dizziness   Yes Historical Provider, MD  Multiple Vitamins-Minerals (PRESERVISION AREDS 2 PO) Take 1 tablet by mouth daily.   Yes Historical Provider, MD  ONE TOUCH ULTRA TEST test strip daily. 01/27/15  Yes Historical Provider, MD  Polyethyl Glycol-Propyl Glycol (SYSTANE OP) Apply 1 drop to eye 2 (two) times daily.   Yes Historical Provider, MD  sitaGLIPtin-metformin (JANUMET) 50-1000 MG per tablet Take 1 tablet by mouth 2 (two) times daily with a meal.   Yes Historical Provider, MD  vitamin C (ASCORBIC ACID) 500 MG tablet Take 500 mg by mouth daily.   Yes Historical Provider, MD  cloNIDine (CATAPRES) 0.2 MG tablet Take 1 tablet (0.2 mg total) by mouth 2 (two) times daily. 06/22/16   Rogelia Mire, NP    Review of Systems     As above, she has been having nausea that she attributes to hydralazine therapy. She would like to come off of it. She denies chest pain, palpitations, PND, orthopnea, dizziness, syncope, or early satiety. She has had intermittent lower extremity swelling.  All other systems reviewed and are otherwise negative except as  noted above.  Physical Exam    VS:  BP (!) 192/67   Pulse 62   Ht 5' (1.524 m)   Wt 140 lb 9.6 oz (63.8 kg)   BMI 27.46 kg/m  , BMI Body mass index is 27.46 kg/m. GEN: Well nourished, well developed, in no acute distress.  HEENT: normal.  Neck: Supple, no JVD, carotid bruits, or masses. Cardiac: RRR, no murmurs, rubs, or gallops. No clubbing, cyanosis, Trace bilateral lower extremity edema.  Radials/DP/PT 2+ and equal bilaterally.  Respiratory:  Respirations regular and unlabored, clear to auscultation bilaterally. GI: Soft, nontender, nondistended, BS + x 4. MS: no deformity or atrophy. Skin: warm and dry, no rash. Neuro:  Strength and sensation are intact. Psych: Normal affect.  Accessory Clinical Findings   Echo and lab work from hospitalization reviewed. Lexiscan Cardiolite reviewed.   Assessment & Plan    1.  Chronic diastolic congestive heart failure: Patient was recently admitted with acute on chronic diastolic heart failure in the setting of hypertensive urgency. Her medications required adjustment given worsening renal function and hyperkalemia. ARB therapy was discontinued and she was instead placed on hydralazine while being maintained on amlodipine. She does not feel as though she tolerates hydralazine and thinks that it causes her to feel nauseated and jittery. She would like to come off of it. She is currently on 75 mg 3 times a day though in reality, she is taking 50 twice a day and then 70 5 in the evening. We discussed options for switching her. She has a resting heart rate of 62 and thus I don't think we will be able to titrate her beta blocker to an adequate dose that would lower her blood pressure. We discussed clonidine as an option including clonidine patch. She doesn't think she be able to afford a clonidine patch. I will start her on clonidine 0.2 mg twice a day. She'll continue to track her blood pressures at home and we will bring her back in 2 weeks to see our  pharmacist here. She does have mild lower extremity swelling but otherwise appears euvolemic.  She has not been taking her lasix as prescribed.  We discussed the importance of daily weights, sodium restriction, medication compliance, and symptom reporting and she verbalizes understanding.   2. Hypertensive heart disease: Blood pressure is elevated today and as noted above,  she has not been taking her hydralazine or Lasix as prescribed. She would like to come off of hydralazine because she does not feel that she tolerates it and as a result, we are prescribing clonidine 0.2 mg twice a day to start this evening. She'll continue to follow her blood pressures at home and follow-up for blood pressure check in approximately 2 weeks.  3. Coronary artery disease: She has not been having any chest pain. She recently underwent Lexiscan stress testing, which was low risk. She is not on beta blocker in the setting of relative bradycardia. No statin in the setting of prior intolerance.  4. Stage III chronic kidney disease: This was stable at discharge. She is no longer on ARB therapy secondary to hyperkalemia noted during hospitalization.  5.  DMII:  Managed w/ PO meds by primary care.  6.  Dispo:  F/u BP check in 2 wks.  Murray Hodgkins, NP 06/22/2016, 5:05 PM

## 2016-06-22 NOTE — Patient Instructions (Addendum)
Medication Instructions:  STOP Hydralazine  STOP Valsartan STOP Losartan START Clonidine 0.2mg  , Take 1 tab by mouth TWICE a day  Labwork: None Ordered  Testing/Procedures: None Ordered  Follow-Up: Your physician recommends that you schedule a follow-up appointment in: 2 WEEKS WITH PHARM D.   Your physician recommends that you schedule a follow-up appointment in: 3 MONTHS  With DR HILTY   Any Other Special Instructions Will Be Listed Below (If Applicable).     If you need a refill on your cardiac medications before your next appointment, please call your pharmacy.

## 2016-06-29 ENCOUNTER — Telehealth: Payer: Self-pay | Admitting: Internal Medicine

## 2016-06-29 NOTE — Telephone Encounter (Signed)
Routed to MD [place] now, MD [facility] for remainder of day.

## 2016-06-29 NOTE — Telephone Encounter (Signed)
Received call from patient-Pt reports going to the beach this weekend and is now having increase in SOB, swelling to BL LE, and increase in weight x 1 week (4-5 lbs).  Pt reports she is weak and "trembly".  Pt reports taking 20mg  Lasix daily.  Denies increase in salt intake.  Neighbor on the phone to speak with RN (ok per pt), and is concerned about her BP.  Reports BP has been running high: 170/51 HR 49, 176/61, 181/58, 205/65, 185/54.  Pt reports HA and dizziness as well.  Pt able to speak in full sentences but neighbor reports she is SOB with exertion and can tell her face is "fuller".    Advised I would route to MD for recommendations.  Verbalized understanding.

## 2016-06-29 NOTE — Telephone Encounter (Signed)
Spoke with patient, advised of MD recommendations:  [name] Harp, MD   06/29/16 2:46 PM  Note    Have her increase her Lasix from 20-40 mg a day for the next 3 days and have her come back to see mid-level provider later this week for evaluation      Advised to monitor fluid intake, salt intake, and daily weights.   Appt 9/7 with MD Hilty @ 1145 at [facility] location already scheduled for f/u.  Pt verbalized understanding.

## 2016-06-29 NOTE — Telephone Encounter (Signed)
Have her increase her Lasix from 20-40 mg a day for the next 3 days and have her come back to see mid-level provider later this week for evaluation

## 2016-07-01 ENCOUNTER — Ambulatory Visit (INDEPENDENT_AMBULATORY_CARE_PROVIDER_SITE_OTHER): Payer: Medicare Other | Admitting: Internal Medicine

## 2016-07-01 ENCOUNTER — Encounter: Payer: Self-pay | Admitting: Internal Medicine

## 2016-07-01 VITALS — BP 92/58 | HR 46 | Ht 60.0 in | Wt 135.1 lb

## 2016-07-01 DIAGNOSIS — I480 Paroxysmal atrial fibrillation: Secondary | ICD-10-CM | POA: Diagnosis not present

## 2016-07-01 DIAGNOSIS — I10 Essential (primary) hypertension: Secondary | ICD-10-CM | POA: Diagnosis not present

## 2016-07-01 DIAGNOSIS — I110 Hypertensive heart disease with heart failure: Secondary | ICD-10-CM

## 2016-07-01 DIAGNOSIS — Z951 Presence of aortocoronary bypass graft: Secondary | ICD-10-CM

## 2016-07-01 DIAGNOSIS — I25810 Atherosclerosis of coronary artery bypass graft(s) without angina pectoris: Secondary | ICD-10-CM

## 2016-07-01 NOTE — Progress Notes (Signed)
OFFICE NOTE  Chief Complaint:  Hospital follow-up  Primary Care Physician: Ileana Roup, MD  HPI:  [name] is a pleasant 75 year old female with history of coronary disease and CABG x 3 vessels in 2013, LIMA to LAD, SVG to OM and SVG to right coronary. She underwent an echocardiogram which demonstrates normal EF of 55% to 65%. However, there is stage 1 diastolic dysfunction and evidence for elevated mean left atrial filling pressure. There were mildly thickened calcified aortic valve leaflets but no stenosis and mild tricuspid regurgitation. At the time, I started her on Lasix 20 mg daily. A BMP was obtained that day which indicated mild elevation at 75.5, creatinine was 1.16. She has been taking the Lasix with improvement in her lower extremity swelling although shortness of breath seems to be about the same. Again, she notes it is only with marked exertion, not necessarily with normal activities. However, she does have ongoing fatigue and easy fatigability. This may be due to just poor exercise tolerance. She also reported that recently you started her on a new SLGT2 diabetic medicine and she said that she took several doses of that, which I believe was Invokana, and she became markedly dizzy with that. She has since stopped that medication. Again, overall the patient is fairly stable with a normal ejection fraction. She recently had bypass and is not having any anginal symptoms and therefore I do not suspect that this is an issue related to her bypass grafts.   I saw [name] today in the office for an acute visit regarding what is described as abdominal pain. She reports a numbness and pain sensation over the right upper quadrant. She's also noted some firmness and distention of her abdomen. This is recently presented to a GI doctor in [place] who thought she might have a gastric ulcer. He performed endoscopy which was a reportedly unrevealing. He also recommended she is take  MiraLAX for constipation. She says that recently she's been constipated but also has been having some small amount of loose stool. She did not want to take the MiraLAX given the loose stool. She also reports some of the discomfort extends across the mid epigastrium and into the lower chest. It is entirely atypical for cardiac chest pain. She reports that she had a colonoscopy in the past by Dr. Henrene Pastor about 5 years ago and a repeat colonoscopy was recommended around 2018. She also tells me that she recently had a right upper quadrant ultrasound performed at [facility] after presenting with abdominal pain which was negative for acute biliary disease.  [name] returns today for follow-up. Overall she seems to be doing well from a cardiac standpoint. I understand she is switched primary care providers to [facility] primary care, however she is unclear of her primary care provider. She does have an upcoming appointment to see Jackelyn Knife neurology. Recently she's been having trouble with forgetfulness and it seems to be getting worse. She tells me she lost her cell phone at home and has not been able to locate it. She denies any chest pain or shortness of breath and in fact her flank back and abdominal pain which was evaluated extensively has now gone away.  I had the pleasure of seeing [name] back today in follow-up. It's been 6 months and she'll last appointment. Her main concern today is leg weakness. She reports she has some back pain and associated weakness in her legs that give out from  time to time. She recently had memory testing indicating possible dementia as well as likely depression. It was recommended that she go on therapy however she declined medications. She denies any chest pain or worsening shortness of breath.  07/01/2016  [name] returns today for follow-up. She was recently hospitalized for acute diastolic heart failure. She was treated with diuretics and  adjustments are made to her blood pressure medications. She had a repeat echocardiogram which was essentially stable showed normal LV EF and diastolic dysfunction. After discharge she underwent nuclear stress testing which was negative for ischemia. She saw Ignacia Bayley, NP, who made some changes to her blood pressure medications including placing her on clonidine. Since taking this medicine she has had some of the typical symptoms of fatigue and dry mouth as well as constipation. She was alert initially taking clonidine 2 mg twice daily. She then decreased it to one half tablet or 1 mg twice daily but blood pressure remains high. I checked her blood pressure manually today 160/60. She reports fatigue and low energy.  PMHx:  Past Medical History:  Diagnosis Date  . Acute renal failure, 01/03/12 01/03/2012  . Arthritis    "in my hands"  . Bronchitis   . Chronic diastolic CHF (congestive heart failure) ([place])    a. 05/2016 Echo: EF 60-65%, no rwma, Gr1 DD, Ao sclerosis w/o stenosis, triv MR.  . CKD (chronic kidney disease), stage III   . Coronary artery disease    a. 02/2007 Persantine MV: low risk;  b. 11/2011 CABG x 3 (LIMA->LAD, VG->OM, VG->RCA);  c. 05/2016 MV: EF >65%, no isch/infarct.  . Depression   . Diabetes mellitus   . Diabetic neuropathy ([place])   . Diverticulosis   . Esophageal stricture   . GERD (gastroesophageal reflux disease)   . Hemorrhoids   . Hiatal hernia   . History of stomach ulcers 1970's  . Hyperlipidemia   . Hypertensive heart disease   . Hypothyroidism   . Neuropathy ([place])   . Pain    RIGHT KNEE PAIN - TORN RIGHT MEDIAL MENISCUS  . Pneumonia   . PONV (postoperative nausea and vomiting)   . S/P CABG (coronary artery bypass graft), 12/04/11 12/07/2011   LIMA to LAD, SVG to OM, SVG to RCA    Past Surgical History:  Procedure Laterality Date  . ABDOMINAL HYSTERECTOMY  1980's  . BACK SURGERY  2006   "cyst growing near my spine"  . CARDIAC CATHETERIZATION  12/02/2011    mild LV dysfunction with mod hypocontractility of mid-distal anterolateral wall; CAD w/ostial tapering of L [place] with 50% diffuse ostial narrowing of LAD, 99% eccentric focal prox LAD stenosis followed by 70% prox LAD stenosis after 1st diag, 20% mid LAD narrowing; 80% ostial-to-prox L Cfx stenosis & 40-50% irregularity of RCA (Dr. Corky Downs)  . CATARACT EXTRACTION W/ INTRAOCULAR LENS  IMPLANT, BILATERAL  ~ 2010  . [place]  . CORONARY ARTERY BYPASS GRAFT  12/04/2011   Procedure: CORONARY ARTERY BYPASS GRAFTING (CABG);  Surgeon: Tharon Aquas Adelene Idler, MD;  Location: [place];  Service: Open Heart Surgery;  Laterality: N/A;  CABG x three,  using left internal mammary artery, and right leg greater saphenous vein harvested endoscopically  . DILATION AND CURETTAGE OF UTERUS     "a couple times"  . FRACTURE SURGERY     "put pins both side right ankle"  . JOINT REPLACEMENT    . KNEE ARTHROSCOPY WITH MEDIAL MENISECTOMY Right 07/02/2014  Procedure: RIGHT KNEE ARTHROSCOPY WITH PARTIAL MEDIAL MENISTECTOMY, ABRASION CONDROPLASTYU OF PATELLA,ABRASION CONDROPLASTY OF MEDIAL FEMEROL CONDYL, MICROFRACTURE OF MEDIAL FEMEROL CONDYL;  Surgeon: Tobi Bastos, MD;  Location: [facility];  Service: Orthopedics;  Laterality: Right;  . LEFT HEART CATHETERIZATION WITH CORONARY ANGIOGRAM N/A 12/02/2011   Procedure: LEFT HEART CATHETERIZATION WITH CORONARY ANGIOGRAM;  Surgeon: Troy Sine, MD;  Location: [facility] CATH LAB;  Service: Cardiovascular;  Laterality: N/A;  Coronary angiogram, possible PCI  . TONSILLECTOMY  1949  . TOTAL KNEE ARTHROPLASTY  ~ 2006   left  . TRANSTHORACIC ECHOCARDIOGRAM  02/19/2013   EF 51-88%, grade 1 diastolic dysfunction; mildly thickend/calcified AV leaflets; mildly calcidied MV annulus; mild TR    FAMHx:  Family History  Problem Relation Age of Onset  . Diabetes Mother     also, HTN & CVA  . Heart disease Father     also hyperlipidemia  . Breast cancer Sister     x 3  . Heart disease  Brother     x5; one with MI  . Heart disease Sister     x3  . Diabetes Sister     x3  . Lung cancer Sister   . Breast cancer Sister     x2  . Colon cancer Neg Hx     SOCHx:   reports that she has never smoked. She has never used smokeless tobacco. She reports that she drinks alcohol. She reports that she does not use drugs.  ALLERGIES:  Allergies  Allergen Reactions  . Crestor  [Rosuvastatin Calcium]     Other reaction(s): tired and weak  . Epinephrine Other (See Comments)    Abnormal feeling. Dental exam/injection of local w/ epi.  Marland Kitchen Hydralazine     Nausea/gi upset   . Other     MANGO'S - WHELPS ALL OVER  . Sulfa Antibiotics     ROS: Pertinent items noted in HPI and remainder of comprehensive ROS otherwise negative.  HOME MEDS: Current Outpatient Prescriptions  Medication Sig Dispense Refill  . amLODipine (NORVASC) 10 MG tablet TAKE ONE TABLET BY MOUTH ONCE DAILY IN THE MORNING 30 tablet 2  . cloNIDine (CATAPRES) 0.2 MG tablet Take one-half tablet by mouth in the morning and a whole tablet in the evening.    . cyanocobalamin (RA VITAMIN B-12) 100 MCG tablet Take 100 mcg by mouth daily.     Marland Kitchen esomeprazole (NEXIUM) 20 MG capsule Take 1 capsule (20 mg total) by mouth daily at 12 noon. 30 capsule 3  . furosemide (LASIX) 20 MG tablet TAKE ONE TABLET BY MOUTH ONCE DAILY (Patient taking differently: TAKE ONE TABLET BY MOUTH ONCE DAILY as needed for fluid) 30 tablet 8  . gabapentin (NEURONTIN) 300 MG capsule Take 300 mg by mouth 2 (two) times daily.     . insulin glargine (LANTUS) 100 UNIT/ML injection Inject 14-16 Units into the skin every morning.     Marland Kitchen levothyroxine (SYNTHROID, LEVOTHROID) 50 MCG tablet Take 50 mcg by mouth daily before breakfast.     . Magnesium 300 MG CAPS Take 300 mg by mouth daily.     . meclizine (ANTIVERT) 25 MG tablet Take 25 mg by mouth daily as needed for dizziness. For dizziness    . Multiple Vitamins-Minerals (PRESERVISION AREDS 2 PO) Take 1  tablet by mouth daily.    . ONE TOUCH ULTRA TEST test strip daily.    Vladimir Faster Glycol-Propyl Glycol (SYSTANE OP) Apply 1 drop to eye 2 (two) times daily.    Marland Kitchen  sitaGLIPtin-metformin (JANUMET) 50-1000 MG per tablet Take 1 tablet by mouth 2 (two) times daily with a meal.    . vitamin C (ASCORBIC ACID) 500 MG tablet Take 500 mg by mouth daily.     No current facility-administered medications for this visit.     LABS/IMAGING: No results found for this or any previous visit (from the past 48 hour(s)). No results found.  VITALS: BP (!) 92/58 (BP Location: Right Arm, Patient Position: Sitting, Cuff Size: Normal)   Pulse (!) 46   Ht 5' (1.524 m)   Wt 135 lb 2 oz (61.3 kg)   SpO2 100%   BMI 26.39 kg/m   EXAM: General appearance: alert and no distress Neck: no carotid bruit and no JVD Lungs: clear to auscultation bilaterally Heart: regular rate and rhythm, S1, S2 normal, no murmur, click, rub or gallop Abdomen: Protuberant, distended with mild to be need to percussion, no rebound and mild guarding in the right upper quadrant, negative Murphy sign Extremities: extremities normal, atraumatic, no cyanosis or edema Pulses: 2+ and symmetric Skin: Bilateral erythema Neurologic: Grossly normal  EKG: Deferred  ASSESSMENT: 1. Coronary artery disease status post CABG x 3 vessels in 2013 2. Diabetes type 2 - now on insulin 3. Hypertension 4. Dyslipidemia 5. Peripheral neuropathy - secondary to diabetes 6. Progressive memory loss 7. Low back pain and leg weakness  PLAN: 1.   Ms. Sek has reported some worsening fatigue and low energy. Heart rate is in the 40s today. We have to monitor this very closely. She is not on a beta blocker. Blood pressure remains elevated. We discussed increasing her clonidine to 1 pill (2 mg) at night and one half pill in the morning. She should also move her amlodipine to take in the morning for better daytime blood pressure affect. She has a follow-up  appointment for blood pressure scheduled on September 14 and is scheduled to see me back in November.  [name], MD, [facility] Attending Cardiologist [place] 07/01/2016, 1:16 PM

## 2016-07-01 NOTE — Patient Instructions (Addendum)
Take clonidine to WHOLE tablet at night and take HALF tablet in the morning  Keep scheduled BP check appointment  Keep scheduled appointment with Dr. Debara Pickett

## 2016-07-08 ENCOUNTER — Encounter: Payer: Self-pay | Admitting: Pharmacist

## 2016-07-08 ENCOUNTER — Ambulatory Visit (INDEPENDENT_AMBULATORY_CARE_PROVIDER_SITE_OTHER): Payer: Medicare Other | Admitting: Pharmacist

## 2016-07-08 VITALS — BP 152/50 | HR 41

## 2016-07-08 DIAGNOSIS — I10 Essential (primary) hypertension: Secondary | ICD-10-CM

## 2016-07-08 NOTE — Progress Notes (Signed)
Patient ID: [name]                 DOB: Sep 23, 1941                      MRN: [id number]     HPI: [name] is a 75 y.o. female patient of Dr. Debara Pickett with PMH below who presents today for hypertension follow up.   Clonidine made her dizzy and drunk feeling. She is taking only 1/2 tablet twice daily. She reports she tried 1 tablet at night but stopped after 4-5 days due to symptoms listed previously. She also reports that she is only taking 1/2 tablet tablet of amlodipine because it makes her so lethargic.   She cancelled her doctor appt yesterday because her HR was so low.   She reports taking 20mg  of Lasix every day. Weight this morning 134 lbs.   She reports that her breathing is better.   Cardiac Hx: Diastolic HF, HTN, HLD, NSTEMI, PAF post- CABG (now NSR), CAD, DM2, CKD  Current HTN meds:  Amlodipine 10mg  Qpm - made her tired so she moved back to evening  Clonidine 0.1mg  BID Furosemide 20mg  daily  Previously tried:  Valsartan 320mg  - hyperkalemia Hydralazine - nauseated and jittery  BP goal: <140/90  Family Hx: mother with hypertension, multiple strokes, died at 23 with MI; father died at 36 with "cholesterol" problems; 12 siblings, 4 brothers all died young with "cholesterol" 3 sisters with CABG; other siblings with aneurysm, stroke and hypertension (all siblings now deceased)  Social Hx: no tobacco, rare alcohol, 1-2 cups of coffee per day  Diet: Lives alone, so does not prepare full meals, but grazes; does not eat fast foods, nor does she add salt to her foods  Exercise: none due to chronic leg and back pain/weakness  Home BP readings:  Majority of her pressures are >150, with rare pressures in 120/40s. Her diastolic typically runs in 40-50s.  Wt Readings from Last 3 Encounters:  07/01/16 135 lb 2 oz (61.3 kg)  06/22/16 140 lb 9.6 oz (63.8 kg)  05/25/16 135 lb 8 oz (61.5 kg)   BP Readings from Last 3 Encounters:  07/08/16 (!) 152/50  07/01/16 (!)  92/58  06/22/16 (!) 192/67   Pulse Readings from Last 3 Encounters:  07/08/16 (!) 41  07/01/16 (!) 46  06/22/16 62    Renal function: CrCl cannot be calculated (Unknown ideal weight.).  Past Medical History:  Diagnosis Date  . Acute renal failure, 01/03/12 01/03/2012  . Arthritis    "in my hands"  . Bronchitis   . Chronic diastolic CHF (congestive heart failure) ([place])    a. 05/2016 Echo: EF 60-65%, no rwma, Gr1 DD, Ao sclerosis w/o stenosis, triv MR.  . CKD (chronic kidney disease), stage III   . Coronary artery disease    a. 02/2007 Persantine MV: low risk;  b. 11/2011 CABG x 3 (LIMA->LAD, VG->OM, VG->RCA);  c. 05/2016 MV: EF >65%, no isch/infarct.  . Depression   . Diabetes mellitus   . Diabetic neuropathy ([place])   . Diverticulosis   . Esophageal stricture   . GERD (gastroesophageal reflux disease)   . Hemorrhoids   . Hiatal hernia   . History of stomach ulcers 1970's  . Hyperlipidemia   . Hypertensive heart disease   . Hypothyroidism   . Neuropathy ([place])   . Pain    RIGHT KNEE PAIN - TORN RIGHT MEDIAL MENISCUS  . Pneumonia   .  PONV (postoperative nausea and vomiting)   . S/P CABG (coronary artery bypass graft), 12/04/11 12/07/2011   LIMA to LAD, SVG to OM, SVG to RCA    Current Outpatient Prescriptions on File Prior to Visit  Medication Sig Dispense Refill  . amLODipine (NORVASC) 10 MG tablet TAKE ONE TABLET BY MOUTH ONCE DAILY IN THE MORNING 30 tablet 2  . cloNIDine (CATAPRES) 0.2 MG tablet Take one-half tablet by mouth in the morning and a whole tablet in the evening.    Marland Kitchen esomeprazole (NEXIUM) 20 MG capsule Take 1 capsule (20 mg total) by mouth daily at 12 noon. 30 capsule 3  . furosemide (LASIX) 20 MG tablet TAKE ONE TABLET BY MOUTH ONCE DAILY (Patient taking differently: TAKE ONE TABLET BY MOUTH ONCE DAILY as needed for fluid) 30 tablet 8  . gabapentin (NEURONTIN) 300 MG capsule Take 300 mg by mouth 2 (two) times daily.     . insulin glargine (LANTUS) 100 UNIT/ML  injection Inject 14-16 Units into the skin every morning.     Marland Kitchen levothyroxine (SYNTHROID, LEVOTHROID) 50 MCG tablet Take 50 mcg by mouth daily before breakfast.     . Magnesium 300 MG CAPS Take 300 mg by mouth daily.     . meclizine (ANTIVERT) 25 MG tablet Take 25 mg by mouth daily as needed for dizziness. For dizziness    . Multiple Vitamins-Minerals (PRESERVISION AREDS 2 PO) Take 1 tablet by mouth daily.    . ONE TOUCH ULTRA TEST test strip daily.    Vladimir Faster Glycol-Propyl Glycol (SYSTANE OP) Apply 1 drop to eye 2 (two) times daily.    . sitaGLIPtin-metformin (JANUMET) 50-1000 MG per tablet Take 1 tablet by mouth 2 (two) times daily with a meal.    . vitamin C (ASCORBIC ACID) 500 MG tablet Take 500 mg by mouth daily.     No current facility-administered medications on file prior to visit.     Allergies  Allergen Reactions  . Crestor  [Rosuvastatin Calcium]     Other reaction(s): tired and weak  . Epinephrine Other (See Comments)    Abnormal feeling. Dental exam/injection of local w/ epi.  Marland Kitchen Hydralazine     Nausea/gi upset   . Other     MANGO'S - WHELPS ALL OVER  . Sulfa Antibiotics     Blood pressure (!) 152/50, pulse (!) 41.   Assessment/Plan: Hypertension:  BP is elevated today in office. However, she has had an extremely difficult time tolerating medications. Will have her continue to take her medications as she has been in the hopes that she will be able to at least tolerate these doses and hopefully be able to titrate in the future. Follow-up in hypertension clinic in 3-4 weeks.    Thank you, Lelan Pons. Patterson [place], [place]

## 2016-07-08 NOTE — Patient Instructions (Signed)
Return for a follow up appointment in 4 weeks   Check your blood pressure at home daily (if able) and keep record of the readings.  Take your BP meds as follows: Continue to take clonidine 1/2 tablet twice daily and amlodipine 1/2 tablet in the evening.    Bring all of your meds, your BP cuff and your record of home blood pressures to your next appointment.  Exercise as you're able, try to walk approximately 30 minutes per day.  Keep salt intake to a minimum, especially watch canned and prepared boxed foods.  Eat more fresh fruits and vegetables and fewer canned items.  Avoid eating in fast food restaurants.    HOW TO TAKE YOUR BLOOD PRESSURE: . Rest 5 minutes before taking your blood pressure. .  Don't smoke or drink caffeinated beverages for at least 30 minutes before. . Take your blood pressure before (not after) you eat. . Sit comfortably with your back supported and both feet on the floor (don't cross your legs). . Elevate your arm to heart level on a table or a desk. . Use the proper sized cuff. It should fit smoothly and snugly around your bare upper arm. There should be enough room to slip a fingertip under the cuff. The bottom edge of the cuff should be 1 inch above the crease of the elbow. . Ideally, take 3 measurements at one sitting and record the average.

## 2016-07-14 ENCOUNTER — Telehealth: Payer: Self-pay | Admitting: Internal Medicine

## 2016-07-14 NOTE — Telephone Encounter (Signed)
New message    Patient daughter calling    Pt c/o BP issue: STAT if pt c/o blurred vision, one-sided weakness or slurred speech  1. What are your last 5 BP readings? Today 155/44 pulse 37 , yesterday 140/54 .   2. Are you having any other symptoms (ex. Dizziness, headache, blurred vision, passed out)? No   3. What is your BP issue? Very Tired, very  weak in legs, heart rate 30-40

## 2016-07-14 NOTE — Telephone Encounter (Signed)
I attempted to return call - I have searched for and cannot locate a DPR giving approval to discuss care w/ daughter or son-in-law but they are listed as contacts.  Pt has listed home number - I called and left msg requesting pt to call back.

## 2016-07-15 NOTE — Telephone Encounter (Signed)
She may be intolerant of clonidine - would wean that down to 1/2 tablet daily, allow higher BP. If HR remains in the 30's, may need to go to the ER.  Dr. Lemmie Evens

## 2016-07-15 NOTE — Telephone Encounter (Signed)
Called patient and spoke w/ her in detail; she understands I do not have DPR on hand for daughter - she will get this completed next time in office. Advised on med changes, these were thoroughly discussed and patient voiced understanding of rationale and reason for this. She was advised to go to the ER if she continues to have rates in 30s and/or worse symptoms of fatigue, leg weakness. Advised that some of these issues may resolve once she has had time for the medication adjustments to work. Pt aware if her daughter [name] needs to speak with [place] I am happy to advise her on med changes/care plan, but would need the patient's verbal OK during same encounter to do so. She voiced acknowledgment of this and will have [name] call from patient home if she needs to.

## 2016-07-15 NOTE — Telephone Encounter (Signed)
Dr. Debara Pickett, see daughter's concerns outlined below - bradycardia, fatigue, leg weakness. I reviewed her chart notes and you noted the bradycardia and that this was something you wanted to monitor closely. I have tried to reach out to patient but have not heard back yet. Do you have any recommendations?

## 2016-08-02 ENCOUNTER — Encounter: Payer: Self-pay | Admitting: Nurse Practitioner

## 2016-08-02 ENCOUNTER — Ambulatory Visit (INDEPENDENT_AMBULATORY_CARE_PROVIDER_SITE_OTHER): Payer: Medicare Other | Admitting: Nurse Practitioner

## 2016-08-02 VITALS — BP 197/71 | HR 52 | Ht 60.0 in | Wt 134.4 lb

## 2016-08-02 DIAGNOSIS — I110 Hypertensive heart disease with heart failure: Secondary | ICD-10-CM | POA: Diagnosis not present

## 2016-08-02 DIAGNOSIS — I5032 Chronic diastolic (congestive) heart failure: Secondary | ICD-10-CM

## 2016-08-02 DIAGNOSIS — I2510 Atherosclerotic heart disease of native coronary artery without angina pectoris: Secondary | ICD-10-CM | POA: Diagnosis not present

## 2016-08-02 MED ORDER — DOXAZOSIN MESYLATE 1 MG PO TABS: 1.0000 mg | ORAL_TABLET | Freq: Every day | ORAL | 12 refills | Status: DC

## 2016-08-02 NOTE — Progress Notes (Signed)
Office Visit    Patient Name: [name] Date of Encounter: 08/02/2016  Primary Care Provider:  Ileana Roup, MD Primary Cardiologist:  C. Hilty, MD   Chief Complaint    66 show female with history of coronary disease, hypertension, hyperlipidemia, diabetes, diastolic heart failure, who presents for follow-up related to ongoing elevated blood pressures and fatigue.  Past Medical History    Past Medical History:  Diagnosis Date  . Acute renal failure, 01/03/12 01/03/2012  . Arthritis    "in my hands"  . Bronchitis   . Chronic diastolic CHF (congestive heart failure) ([place])    a. 05/2016 Echo: EF 60-65%, no rwma, Gr1 DD, Ao sclerosis w/o stenosis, triv MR.  . CKD (chronic kidney disease), stage III   . Coronary artery disease    a. 02/2007 [place] MV: low risk;  b. 11/2011 CABG x 3 (LIMA->LAD, VG->OM, VG->RCA);  c. 05/2016 MV: EF >65%, no isch/infarct.  . Depression   . Diabetes mellitus   . Diabetic neuropathy ([place])   . Diverticulosis   . Esophageal stricture   . GERD (gastroesophageal reflux disease)   . Hemorrhoids   . Hiatal hernia   . History of stomach ulcers 1970's  . Hyperlipidemia   . Hypertensive heart disease   . Hypothyroidism   . Neuropathy ([place])   . Pain    RIGHT KNEE PAIN - TORN RIGHT MEDIAL MENISCUS  . Pneumonia   . PONV (postoperative nausea and vomiting)   . S/P CABG (coronary artery bypass graft), 12/04/11 12/07/2011   LIMA to LAD, SVG to OM, SVG to RCA   Past Surgical History:  Procedure Laterality Date  . ABDOMINAL HYSTERECTOMY  1980's  . BACK SURGERY  2006   "cyst growing near my spine"  . CARDIAC CATHETERIZATION  12/02/2011   mild LV dysfunction with mod hypocontractility of mid-distal anterolateral wall; CAD w/ostial tapering of L [place] with 50% diffuse ostial narrowing of LAD, 99% eccentric focal prox LAD stenosis followed by 70% prox LAD stenosis after 1st diag, 20% mid LAD narrowing; 80% ostial-to-prox L Cfx stenosis & 40-50%  irregularity of RCA (Dr. Corky Downs)  . CATARACT EXTRACTION W/ INTRAOCULAR LENS  IMPLANT, BILATERAL  ~ 2010  . [place]  . CORONARY ARTERY BYPASS GRAFT  12/04/2011   Procedure: CORONARY ARTERY BYPASS GRAFTING (CABG);  Surgeon: Tharon Aquas Adelene Idler, MD;  Location: [place];  Service: Open Heart Surgery;  Laterality: N/A;  CABG x three,  using left internal mammary artery, and right leg greater saphenous vein harvested endoscopically  . DILATION AND CURETTAGE OF UTERUS     "a couple times"  . FRACTURE SURGERY     "put pins both side right ankle"  . JOINT REPLACEMENT    . KNEE ARTHROSCOPY WITH MEDIAL MENISECTOMY Right 07/02/2014   Procedure: RIGHT KNEE ARTHROSCOPY WITH PARTIAL MEDIAL MENISTECTOMY, ABRASION CONDROPLASTYU OF PATELLA,ABRASION CONDROPLASTY OF MEDIAL FEMEROL CONDYL, MICROFRACTURE OF MEDIAL FEMEROL CONDYL;  Surgeon: Tobi Bastos, MD;  Location: [facility];  Service: Orthopedics;  Laterality: Right;  . LEFT HEART CATHETERIZATION WITH CORONARY ANGIOGRAM N/A 12/02/2011   Procedure: LEFT HEART CATHETERIZATION WITH CORONARY ANGIOGRAM;  Surgeon: Troy Sine, MD;  Location: [facility] CATH LAB;  Service: Cardiovascular;  Laterality: N/A;  Coronary angiogram, possible PCI  . TONSILLECTOMY  1949  . TOTAL KNEE ARTHROPLASTY  ~ 2006   left  . TRANSTHORACIC ECHOCARDIOGRAM  02/19/2013   EF 23-55%, grade 1 diastolic dysfunction; mildly thickend/calcified AV leaflets; mildly  calcidied MV annulus; mild TR    Allergies  Allergies  Allergen Reactions  . Crestor  [Rosuvastatin Calcium]     Other reaction(s): tired and weak  . Epinephrine Other (See Comments)    Abnormal feeling. Dental exam/injection of local w/ epi.  Marland Kitchen Hydralazine     Nausea/gi upset   . Other     MANGO'S - WHELPS ALL OVER  . Sulfa Antibiotics     History of Present Illness    75 year old female with the above complex past medical history including a prior history of CAD status post CABG 3 in February 2013. Other history  includes hypertension, hyperlipidemia, diabetes, and stage III kidney disease. She was admitted to [facility] due to dyspnea, weakness, and hypertension in August. Renal function was above baseline and she was also hyperkalemic, resulting in discontinuation of ARB therapy. She was placed on hydralazine and Lasix and I saw her back in clinic on August 29. She reported feeling poorly on hydralazine and this was subsequently discontinued and in its place, clonidine was added at 0.2 mg twice a day. This helped improve blood pressure however she noted fatigue and also heart rates dropping into the 40s and her clonidine was reduced to 0.1 mg twice a day. She was seen in hypertension clinic on September 14 and was advised to split her Norvasc as she was taking a half tablet in the morning and half a tablet in the afternoon, or at least that was her understanding of directions. She has continued to have significant fatigue and overall low energy and malaise. She has not had any chest pain or dyspnea. She has been checking her blood pressure diligently and for a little while, she was trending in the 150s to 170s but more recently is back into the 190s. The bigger story, is that her heart rate continues to run in the 40s to low 50s on clonidine. She feels that overall she is not tolerating clonidine and would like to be off of it.  Home Medications    Prior to Admission medications   Medication Sig Start Date End Date Taking? Authorizing Provider  amLODipine (NORVASC) 10 MG tablet TAKE ONE TABLET BY MOUTH ONCE DAILY IN THE MORNING 05/24/16  Yes [name], MD  esomeprazole (NEXIUM) 20 MG capsule Take 1 capsule (20 mg total) by mouth daily at 12 noon. 04/13/16  Yes Irene Shipper, MD  furosemide (LASIX) 20 MG tablet Take 20 mg by mouth.   Yes Historical Provider, MD  gabapentin (NEURONTIN) 300 MG capsule Take 300 mg by mouth 2 (two) times daily.    Yes Historical Provider, MD  insulin glargine (LANTUS) 100 UNIT/ML  injection Inject 14-16 Units into the skin every morning.    Yes Historical Provider, MD  levothyroxine (SYNTHROID, LEVOTHROID) 50 MCG tablet Take 50 mcg by mouth daily before breakfast.    Yes Historical Provider, MD  Magnesium 300 MG CAPS Take 300 mg by mouth daily.    Yes Historical Provider, MD  meclizine (ANTIVERT) 25 MG tablet Take 25 mg by mouth daily as needed for dizziness. For dizziness   Yes Historical Provider, MD  Multiple Vitamins-Minerals (PRESERVISION AREDS 2 PO) Take 1 tablet by mouth daily.   Yes Historical Provider, MD  ONE TOUCH ULTRA TEST test strip daily. 01/27/15  Yes Historical Provider, MD  Polyethyl Glycol-Propyl Glycol (SYSTANE OP) Apply 1 drop to eye 2 (two) times daily.   Yes Historical Provider, MD  sitaGLIPtin-metformin (JANUMET) 50-1000 MG per  tablet Take 1 tablet by mouth 2 (two) times daily with a meal.   Yes Historical Provider, MD  vitamin C (ASCORBIC ACID) 500 MG tablet Take 500 mg by mouth daily.   Yes Historical Provider, MD  doxazosin (CARDURA) 1 MG tablet Take 1 tablet (1 mg total) by mouth daily. 08/02/16   Rogelia Mire, NP    Review of Systems    As above, she continues to have fatigue with baseline bradycardia and elevated blood pressures. She denies chest pain, dyspnea, PND, orthopnea, syncope, edema, or early satiety. She does occasionally express dizziness..  All other systems reviewed and are otherwise negative except as noted above.  Physical Exam    VS:  BP (!) 197/71   Pulse (!) 52   Ht 5' (1.524 m)   Wt 134 lb 6.4 oz (61 kg)   BMI 26.25 kg/m  , BMI Body mass index is 26.25 kg/m. GEN: Well nourished, well developed, in no acute distress.  HEENT: normal.  Neck: Supple, no JVD, carotid bruits, or masses. Cardiac: RRR, 2/6 systolic murmur at the left lower sternal border.  No rubs, or gallops. No clubbing, cyanosis, edema.  Radials/DP/PT 2+ and equal bilaterally.  Respiratory:  Respirations regular and unlabored, clear to auscultation  bilaterally. GI: Soft, nontender, nondistended, BS + x 4. MS: no deformity or atrophy. Skin: warm and dry, no rash. Neuro:  Strength and sensation are intact. Psych: Normal affect.  Accessory Clinical Findings    Patient's blood pressure log reviewed in detail   Assessment & Plan    1.  Hypertensive heart disease: Patient continues to struggle with tolerance of antihypertensive medications. She had previously been on losartan therapy and says she was well-managed on that in addition to amlodipine for years, however during admission in August, she was hyperkalemic with acute on chronic renal failure and ARB therapy is discontinued. In the setting of hyperkalemia, I would not plan to resume it or an ACE inhibitor. At that time, she was discharged home on hydralazine however felt poorly and jittery while on it. It was discontinued August 29. She was placed on clonidine therapy however at 0.2 mg twice a day, she was feeling significant fatigue and also is quite bradycardic with rates sometimes in the 30s but often in the 40s. This was cut to 0.1 mg twice a day, where she has been now for a few weeks however she continues to have significant fatigue with heart rates often in the 40s and blood pressure is now climbing into the 190s again. She feels the clonidine is what is causing her fatigue and would like to discontinue it. We had a long discussion about her options. In the setting of ongoing bradycardia, beta blockers not an option. I have asked her to wean her clonidine over the next 3 days and also start doxazosin 1 mg daily. I realize this is a low dose but will allow [place] to see if she tolerates it and also give [place] some room for titration up on hypertension clinic follow-up next week. Once maxed out, I suspect we'll be looking at something like either minoxidil or methyldopa as a next line agent.  2.  Chronic diastolic congestive heart failure: Despite elevated blood pressures, her volume status is  much improved and weights have been stable. She does not currently have any edema and is tolerating low-dose Lasix and equally importantly, is compliant with it.   3. Coronary artery disease: She had a low risk Lexiscan Myoview in  August. She has not been having any chest pain. She is not on beta blocker secondary to baseline bradycardia and is also not on a statin secondary to prior intolerance.  4. Stage III chronic kidney disease: She is no longer on ARB therapy secondary to hyperkalemia noted during hospital position of August.  5. Type 2 diabetes mellitus: Management oral medications by primary care.  6. Disposition: She is follow-up blood pressure check next week and follow-up with Dr. Debara Pickett in August.  Murray Hodgkins, NP 08/02/2016, 4:53 PM

## 2016-08-02 NOTE — Patient Instructions (Signed)
Medication Instructions:   TAKE 1/2 TABLET OF CLONIDINE FOR THE NEXT THREE DAYS AND THEN STOP  START DOXAZOCIN 1 MG ONCE DAILY   Follow-Up:  Your physician recommends that you schedule a follow-up appointment in: AS SCHEDULED

## 2016-08-03 ENCOUNTER — Telehealth: Payer: Self-pay | Admitting: Internal Medicine

## 2016-08-03 NOTE — Telephone Encounter (Signed)
New message   Pt daughter verbalized that her mother came in the ov for an appt and she gave the rn a list of her BP and HR please make a copy and leave the original at the front by 1pm for her to take to her other doctors office today  If you cant find it please call

## 2016-08-03 NOTE — Telephone Encounter (Signed)
Returned call, located requested documents and these are at front for patient to pick up.

## 2016-08-08 ENCOUNTER — Inpatient Hospital Stay (HOSPITAL_COMMUNITY)
Admission: EM | Admit: 2016-08-08 | Discharge: 2016-08-14 | DRG: 243 | Disposition: A | Discharge status: Home / Self Care | Payer: Medicare Other | Attending: Internal Medicine | Admitting: Internal Medicine

## 2016-08-08 ENCOUNTER — Encounter (HOSPITAL_COMMUNITY): Payer: Self-pay

## 2016-08-08 ENCOUNTER — Emergency Department (HOSPITAL_COMMUNITY): Payer: Medicare Other

## 2016-08-08 DIAGNOSIS — Z961 Presence of intraocular lens: Secondary | ICD-10-CM | POA: Diagnosis present

## 2016-08-08 DIAGNOSIS — M19041 Primary osteoarthritis, right hand: Secondary | ICD-10-CM | POA: Diagnosis present

## 2016-08-08 DIAGNOSIS — R51 Headache: Secondary | ICD-10-CM | POA: Diagnosis present

## 2016-08-08 DIAGNOSIS — I484 Atypical atrial flutter: Principal | ICD-10-CM | POA: Diagnosis present

## 2016-08-08 DIAGNOSIS — Z95818 Presence of other cardiac implants and grafts: Secondary | ICD-10-CM

## 2016-08-08 DIAGNOSIS — M19042 Primary osteoarthritis, left hand: Secondary | ICD-10-CM | POA: Diagnosis present

## 2016-08-08 DIAGNOSIS — E785 Hyperlipidemia, unspecified: Secondary | ICD-10-CM | POA: Diagnosis present

## 2016-08-08 DIAGNOSIS — H9193 Unspecified hearing loss, bilateral: Secondary | ICD-10-CM | POA: Diagnosis present

## 2016-08-08 DIAGNOSIS — D631 Anemia in chronic kidney disease: Secondary | ICD-10-CM | POA: Diagnosis present

## 2016-08-08 DIAGNOSIS — M1711 Unilateral primary osteoarthritis, right knee: Secondary | ICD-10-CM | POA: Diagnosis present

## 2016-08-08 DIAGNOSIS — N184 Chronic kidney disease, stage 4 (severe): Secondary | ICD-10-CM | POA: Diagnosis present

## 2016-08-08 DIAGNOSIS — K222 Esophageal obstruction: Secondary | ICD-10-CM | POA: Diagnosis present

## 2016-08-08 DIAGNOSIS — I340 Nonrheumatic mitral (valve) insufficiency: Secondary | ICD-10-CM | POA: Diagnosis not present

## 2016-08-08 DIAGNOSIS — I252 Old myocardial infarction: Secondary | ICD-10-CM

## 2016-08-08 DIAGNOSIS — I495 Sick sinus syndrome: Secondary | ICD-10-CM | POA: Diagnosis present

## 2016-08-08 DIAGNOSIS — Z794 Long term (current) use of insulin: Secondary | ICD-10-CM

## 2016-08-08 DIAGNOSIS — K219 Gastro-esophageal reflux disease without esophagitis: Secondary | ICD-10-CM | POA: Diagnosis present

## 2016-08-08 DIAGNOSIS — Z801 Family history of malignant neoplasm of trachea, bronchus and lung: Secondary | ICD-10-CM

## 2016-08-08 DIAGNOSIS — Z9842 Cataract extraction status, left eye: Secondary | ICD-10-CM

## 2016-08-08 DIAGNOSIS — I5033 Acute on chronic diastolic (congestive) heart failure: Secondary | ICD-10-CM

## 2016-08-08 DIAGNOSIS — I10 Essential (primary) hypertension: Secondary | ICD-10-CM

## 2016-08-08 DIAGNOSIS — Z833 Family history of diabetes mellitus: Secondary | ICD-10-CM

## 2016-08-08 DIAGNOSIS — E1122 Type 2 diabetes mellitus with diabetic chronic kidney disease: Secondary | ICD-10-CM | POA: Diagnosis present

## 2016-08-08 DIAGNOSIS — N183 Chronic kidney disease, stage 3 (moderate): Secondary | ICD-10-CM | POA: Diagnosis present

## 2016-08-08 DIAGNOSIS — R778 Other specified abnormalities of plasma proteins: Secondary | ICD-10-CM

## 2016-08-08 DIAGNOSIS — I2510 Atherosclerotic heart disease of native coronary artery without angina pectoris: Secondary | ICD-10-CM | POA: Diagnosis present

## 2016-08-08 DIAGNOSIS — Z951 Presence of aortocoronary bypass graft: Secondary | ICD-10-CM

## 2016-08-08 DIAGNOSIS — I160 Hypertensive urgency: Secondary | ICD-10-CM | POA: Diagnosis present

## 2016-08-08 DIAGNOSIS — E1169 Type 2 diabetes mellitus with other specified complication: Secondary | ICD-10-CM | POA: Diagnosis present

## 2016-08-08 DIAGNOSIS — Z91018 Allergy to other foods: Secondary | ICD-10-CM

## 2016-08-08 DIAGNOSIS — Z8249 Family history of ischemic heart disease and other diseases of the circulatory system: Secondary | ICD-10-CM

## 2016-08-08 DIAGNOSIS — I130 Hypertensive heart and chronic kidney disease with heart failure and stage 1 through stage 4 chronic kidney disease, or unspecified chronic kidney disease: Secondary | ICD-10-CM | POA: Diagnosis present

## 2016-08-08 DIAGNOSIS — Z96652 Presence of left artificial knee joint: Secondary | ICD-10-CM | POA: Diagnosis present

## 2016-08-08 DIAGNOSIS — I248 Other forms of acute ischemic heart disease: Secondary | ICD-10-CM | POA: Diagnosis present

## 2016-08-08 DIAGNOSIS — Z823 Family history of stroke: Secondary | ICD-10-CM

## 2016-08-08 DIAGNOSIS — Z8711 Personal history of peptic ulcer disease: Secondary | ICD-10-CM

## 2016-08-08 DIAGNOSIS — I152 Hypertension secondary to endocrine disorders: Secondary | ICD-10-CM | POA: Diagnosis present

## 2016-08-08 DIAGNOSIS — R7989 Other specified abnormal findings of blood chemistry: Secondary | ICD-10-CM

## 2016-08-08 DIAGNOSIS — Z90710 Acquired absence of both cervix and uterus: Secondary | ICD-10-CM

## 2016-08-08 DIAGNOSIS — R748 Abnormal levels of other serum enzymes: Secondary | ICD-10-CM | POA: Diagnosis not present

## 2016-08-08 DIAGNOSIS — Z882 Allergy status to sulfonamides status: Secondary | ICD-10-CM

## 2016-08-08 DIAGNOSIS — F329 Major depressive disorder, single episode, unspecified: Secondary | ICD-10-CM | POA: Diagnosis present

## 2016-08-08 DIAGNOSIS — G3184 Mild cognitive impairment, so stated: Secondary | ICD-10-CM | POA: Diagnosis present

## 2016-08-08 DIAGNOSIS — Z9841 Cataract extraction status, right eye: Secondary | ICD-10-CM

## 2016-08-08 DIAGNOSIS — I4891 Unspecified atrial fibrillation: Secondary | ICD-10-CM

## 2016-08-08 DIAGNOSIS — E1121 Type 2 diabetes mellitus with diabetic nephropathy: Secondary | ICD-10-CM | POA: Diagnosis present

## 2016-08-08 DIAGNOSIS — K5790 Diverticulosis of intestine, part unspecified, without perforation or abscess without bleeding: Secondary | ICD-10-CM | POA: Diagnosis present

## 2016-08-08 DIAGNOSIS — E039 Hypothyroidism, unspecified: Secondary | ICD-10-CM | POA: Diagnosis present

## 2016-08-08 DIAGNOSIS — R000 Tachycardia, unspecified: Secondary | ICD-10-CM

## 2016-08-08 DIAGNOSIS — Z79899 Other long term (current) drug therapy: Secondary | ICD-10-CM

## 2016-08-08 DIAGNOSIS — E1159 Type 2 diabetes mellitus with other circulatory complications: Secondary | ICD-10-CM | POA: Diagnosis present

## 2016-08-08 DIAGNOSIS — I4892 Unspecified atrial flutter: Secondary | ICD-10-CM | POA: Diagnosis not present

## 2016-08-08 DIAGNOSIS — I5032 Chronic diastolic (congestive) heart failure: Secondary | ICD-10-CM | POA: Diagnosis present

## 2016-08-08 DIAGNOSIS — Z888 Allergy status to other drugs, medicaments and biological substances status: Secondary | ICD-10-CM

## 2016-08-08 DIAGNOSIS — E1142 Type 2 diabetes mellitus with diabetic polyneuropathy: Secondary | ICD-10-CM | POA: Diagnosis present

## 2016-08-08 DIAGNOSIS — Z803 Family history of malignant neoplasm of breast: Secondary | ICD-10-CM

## 2016-08-08 DIAGNOSIS — I480 Paroxysmal atrial fibrillation: Secondary | ICD-10-CM | POA: Diagnosis present

## 2016-08-08 DIAGNOSIS — E119 Type 2 diabetes mellitus without complications: Secondary | ICD-10-CM

## 2016-08-08 DIAGNOSIS — IMO0001 Reserved for inherently not codable concepts without codable children: Secondary | ICD-10-CM

## 2016-08-08 HISTORY — DX: Hyperkalemia: E87.5

## 2016-08-08 HISTORY — DX: Bradycardia, unspecified: R00.1

## 2016-08-08 HISTORY — DX: Mild cognitive impairment of uncertain or unknown etiology: G31.84

## 2016-08-08 HISTORY — DX: Paroxysmal atrial fibrillation: I48.0

## 2016-08-08 LAB — BASIC METABOLIC PANEL
Anion gap: 12 (ref 5–15)
BUN: 25 mg/dL — ABNORMAL HIGH (ref 6–20)
CO2: 20 mmol/L — ABNORMAL LOW (ref 22–32)
Calcium: 9.4 mg/dL (ref 8.9–10.3)
Chloride: 108 mmol/L (ref 101–111)
Creatinine, Ser: 1.39 mg/dL — ABNORMAL HIGH (ref 0.44–1.00)
GFR calc Af Amer: 42 mL/min — ABNORMAL LOW (ref >60)
GFR calc non Af Amer: 36 mL/min — ABNORMAL LOW (ref >60)
Glucose, Bld: 195 mg/dL — ABNORMAL HIGH (ref 65–99)
Potassium: 5.1 mmol/L (ref 3.5–5.1)
Sodium: 140 mmol/L (ref 135–145)

## 2016-08-08 LAB — TSH: TSH: 2.306 u[IU]/mL (ref 0.350–4.500)

## 2016-08-08 LAB — CBC
HCT: 36.8 % (ref 36.0–46.0)
Hemoglobin: 12.6 g/dL (ref 12.0–15.0)
MCH: 31 pg (ref 26.0–34.0)
MCHC: 34.2 g/dL (ref 30.0–36.0)
MCV: 90.6 fL (ref 78.0–100.0)
Platelets: 193 10*3/uL (ref 150–400)
RBC: 4.06 MIL/uL (ref 3.87–5.11)
RDW: 13 % (ref 11.5–15.5)
WBC: 8 10*3/uL (ref 4.0–10.5)

## 2016-08-08 LAB — CBG MONITORING, ED: Glucose-Capillary: 166 mg/dL — ABNORMAL HIGH (ref 65–99)

## 2016-08-08 LAB — TROPONIN I: Troponin I: 0.07 ng/mL (ref <0.03)

## 2016-08-08 LAB — LIPID PANEL
Cholesterol: 330 mg/dL — ABNORMAL HIGH (ref 0–200)
HDL: 42 mg/dL (ref >40)
LDL Cholesterol: 225 mg/dL — ABNORMAL HIGH (ref 0–99)
Total CHOL/HDL Ratio: 7.9 RATIO
Triglycerides: 313 mg/dL — ABNORMAL HIGH (ref <150)
VLDL: 63 mg/dL — ABNORMAL HIGH (ref 0–40)

## 2016-08-08 LAB — I-STAT TROPONIN, ED: Troponin i, poc: 0.07 ng/mL (ref 0.00–0.08)

## 2016-08-08 MED ORDER — ASPIRIN 81 MG PO CHEW
81.0000 mg | CHEWABLE_TABLET | Freq: Every day | ORAL | Status: DC
  Administered 2016-08-08 – 2016-08-14 (×6): 81 mg via ORAL
  Filled 2016-08-08 (×5): qty 1

## 2016-08-08 MED ORDER — SODIUM CHLORIDE 0.9% FLUSH
3.0000 mL | Freq: Two times a day (BID) | INTRAVENOUS | Status: DC
  Administered 2016-08-08 – 2016-08-14 (×8): 3 mL via INTRAVENOUS

## 2016-08-08 MED ORDER — GABAPENTIN 300 MG PO CAPS
300.0000 mg | ORAL_CAPSULE | Freq: Every day | ORAL | Status: DC
  Administered 2016-08-09 – 2016-08-14 (×5): 300 mg via ORAL
  Filled 2016-08-08 (×5): qty 1

## 2016-08-08 MED ORDER — HEPARIN BOLUS VIA INFUSION
3000.0000 [IU] | Freq: Once | INTRAVENOUS | Status: AC
  Administered 2016-08-08: 3000 [IU] via INTRAVENOUS
  Filled 2016-08-08: qty 3000

## 2016-08-08 MED ORDER — HYPROMELLOSE (GONIOSCOPIC) 2.5 % OP SOLN
Freq: Two times a day (BID) | OPHTHALMIC | Status: DC
  Administered 2016-08-09: 2 [drp] via OPHTHALMIC
  Administered 2016-08-09 – 2016-08-10 (×2): via OPHTHALMIC
  Administered 2016-08-10: 2 [drp] via OPHTHALMIC
  Administered 2016-08-13: 22:00:00 via OPHTHALMIC
  Filled 2016-08-08 (×2): qty 15

## 2016-08-08 MED ORDER — HEPARIN (PORCINE) IN NACL 100-0.45 UNIT/ML-% IJ SOLN
800.0000 [IU]/h | INTRAMUSCULAR | Status: DC
  Administered 2016-08-08: 800 [IU]/h via INTRAVENOUS
  Filled 2016-08-08 (×2): qty 250

## 2016-08-08 MED ORDER — AMLODIPINE BESYLATE 5 MG PO TABS
10.0000 mg | ORAL_TABLET | Freq: Once | ORAL | Status: AC
  Administered 2016-08-08: 10 mg via ORAL
  Filled 2016-08-08: qty 2

## 2016-08-08 MED ORDER — DILTIAZEM HCL-DEXTROSE 100-5 MG/100ML-% IV SOLN (PREMIX)
5.0000 mg/h | INTRAVENOUS | Status: DC
  Administered 2016-08-08: 5 mg/h via INTRAVENOUS
  Administered 2016-08-09: 15 mg/h via INTRAVENOUS
  Administered 2016-08-09: 10 mg/h via INTRAVENOUS
  Administered 2016-08-09 – 2016-08-11 (×7): 15 mg/h via INTRAVENOUS
  Filled 2016-08-08 (×10): qty 100

## 2016-08-08 MED ORDER — ACETAMINOPHEN 325 MG PO TABS: 650.0000 mg | ORAL_TABLET | Freq: Four times a day (QID) | ORAL | Status: DC | PRN

## 2016-08-08 MED ORDER — DOXAZOSIN MESYLATE 1 MG PO TABS
1.0000 mg | ORAL_TABLET | Freq: Every day | ORAL | Status: DC
  Administered 2016-08-09 – 2016-08-13 (×4): 1 mg via ORAL
  Filled 2016-08-08 (×5): qty 1

## 2016-08-08 MED ORDER — ONDANSETRON HCL 4 MG/2ML IJ SOLN
4.0000 mg | Freq: Four times a day (QID) | INTRAMUSCULAR | Status: DC | PRN
  Filled 2016-08-08: qty 2

## 2016-08-08 MED ORDER — ENOXAPARIN SODIUM 60 MG/0.6ML ~~LOC~~ SOLN
60.0000 mg | SUBCUTANEOUS | Status: DC
  Filled 2016-08-08: qty 0.6

## 2016-08-08 MED ORDER — LABETALOL HCL 5 MG/ML IV SOLN: 10.0000 mg | Freq: Once | INTRAVENOUS | Status: DC

## 2016-08-08 MED ORDER — INSULIN ASPART 100 UNIT/ML ~~LOC~~ SOLN
0.0000 [IU] | Freq: Three times a day (TID) | SUBCUTANEOUS | Status: DC
  Administered 2016-08-09: 5 [IU] via SUBCUTANEOUS
  Administered 2016-08-09 (×2): 2 [IU] via SUBCUTANEOUS
  Administered 2016-08-10: 5 [IU] via SUBCUTANEOUS
  Administered 2016-08-11: 3 [IU] via SUBCUTANEOUS
  Administered 2016-08-11 – 2016-08-13 (×3): 2 [IU] via SUBCUTANEOUS
  Administered 2016-08-14: 3 [IU] via SUBCUTANEOUS
  Administered 2016-08-14: 5 [IU] via SUBCUTANEOUS

## 2016-08-08 MED ORDER — SODIUM CHLORIDE 0.9 % IV BOLUS (SEPSIS): 1000.0000 mL | Freq: Once | INTRAVENOUS | Status: DC

## 2016-08-08 MED ORDER — ONDANSETRON HCL 4 MG PO TABS: 4.0000 mg | ORAL_TABLET | Freq: Four times a day (QID) | ORAL | Status: DC | PRN

## 2016-08-08 MED ORDER — GABAPENTIN 300 MG PO CAPS
600.0000 mg | ORAL_CAPSULE | Freq: Every day | ORAL | Status: DC
  Administered 2016-08-08 – 2016-08-13 (×6): 600 mg via ORAL
  Filled 2016-08-08 (×7): qty 2

## 2016-08-08 MED ORDER — METOPROLOL TARTRATE 5 MG/5ML IV SOLN
5.0000 mg | Freq: Once | INTRAVENOUS | Status: AC
  Administered 2016-08-08: 5 mg via INTRAVENOUS
  Filled 2016-08-08: qty 5

## 2016-08-08 MED ORDER — DILTIAZEM LOAD VIA INFUSION
15.0000 mg | Freq: Once | INTRAVENOUS | Status: AC
  Administered 2016-08-08: 15 mg via INTRAVENOUS
  Filled 2016-08-08: qty 15

## 2016-08-08 MED ORDER — PANTOPRAZOLE SODIUM 40 MG PO TBEC
40.0000 mg | DELAYED_RELEASE_TABLET | Freq: Every day | ORAL | Status: DC
  Administered 2016-08-09 – 2016-08-14 (×6): 40 mg via ORAL
  Filled 2016-08-08 (×6): qty 1

## 2016-08-08 MED ORDER — PROSIGHT PO TABS
1.0000 | ORAL_TABLET | Freq: Every day | ORAL | Status: DC
  Administered 2016-08-09 – 2016-08-14 (×5): 1 via ORAL
  Filled 2016-08-08 (×6): qty 1

## 2016-08-08 MED ORDER — ACETAMINOPHEN 650 MG RE SUPP: 650.0000 mg | Freq: Four times a day (QID) | RECTAL | Status: DC | PRN

## 2016-08-08 MED ORDER — INSULIN GLARGINE 100 UNIT/ML ~~LOC~~ SOLN
14.0000 [IU] | Freq: Every day | SUBCUTANEOUS | Status: DC
  Administered 2016-08-09 – 2016-08-14 (×6): 14 [IU] via SUBCUTANEOUS
  Filled 2016-08-08 (×6): qty 0.14

## 2016-08-08 MED ORDER — DOCUSATE SODIUM 100 MG PO CAPS
100.0000 mg | ORAL_CAPSULE | Freq: Two times a day (BID) | ORAL | Status: DC
  Administered 2016-08-08 – 2016-08-14 (×11): 100 mg via ORAL
  Filled 2016-08-08 (×11): qty 1

## 2016-08-08 MED ORDER — LEVOTHYROXINE SODIUM 50 MCG PO TABS
50.0000 ug | ORAL_TABLET | Freq: Every day | ORAL | Status: DC
  Administered 2016-08-09 – 2016-08-14 (×6): 50 ug via ORAL
  Filled 2016-08-08 (×6): qty 1

## 2016-08-08 NOTE — ED Notes (Signed)
Pt is aware she needs a urine sample but daughter states she just went to the bathroom

## 2016-08-08 NOTE — ED Provider Notes (Signed)
[facility] DEPT Provider Note   CSN: [id number] Arrival date & time: 08/08/16  1356     History   Chief Complaint Chief Complaint  Patient presents with  . Hypertension  . Tachycardia    HPI [name] is a 75 y.o. female.  [name] is a 75 yo woman with PMH CAD (s/p CABG 2013), poorly-controlled HTN, HLD, DM2 with neuropathy, diastolic CHF, CKD3, hypothyroidism who presents for several days of hypertension, tachycardia, and fatigue. [name] has a recent history of poorly-controlled hypertension, requiring admission in August with difficulties in finding a tolerable antihypertensive regimen since then. She developed hyperkalemia and ARF while taking an ARB, had side effects from hydralazine, and developed bradycardia and malaise from clonidine. Her clonidine was recently weaned off and discontinued 1 week ago and replaced with doxazosin. The patient endorses moderate fatigue over the last month and having difficulty doing her normal activities at home. She endorses a mild frontal headache today. She denies chest pain, shortness of breath, recent illness, fevers/chills, urinary symptoms, abdominal pain/nausea/vomiting.    The history is provided by the patient and a relative.    Past Medical History:  Diagnosis Date  . Arthritis    "in my hands"  . Bronchitis   . Chronic diastolic CHF (congestive heart failure) ([place])    a. 05/2016 Echo: EF 60-65%, no rwma, Gr1 DD, Ao sclerosis w/o stenosis, triv MR.  . CKD (chronic kidney disease), stage III   . Coronary artery disease    a. 02/2007 Persantine MV: low risk;  b. 11/2011 CABG x 3 (LIMA->LAD, VG->OM, VG->RCA);  c. 05/2016 MV: EF >65%, no isch/infarct.  . Depression   . Diabetes mellitus   . Diabetic neuropathy ([place])   . Diverticulosis   . Esophageal stricture   . GERD (gastroesophageal reflux disease)   . Hemorrhoids   . Hiatal hernia   . History of stomach ulcers 1970's  . Hyperlipidemia   . Hypertensive heart  disease   . Hypothyroidism   . Pain    RIGHT KNEE PAIN - TORN RIGHT MEDIAL MENISCUS  . Pneumonia   . PONV (postoperative nausea and vomiting)   . S/P CABG (coronary artery bypass graft), 12/04/11 12/07/2011   LIMA to LAD, SVG to OM, SVG to RCA    Patient Active Problem List   Diagnosis Date Noted  . Hypertensive urgency 08/08/2016  . Atrial fibrillation ([place]) 08/08/2016  . Coronary artery disease   . Hypertensive heart disease   . CKD (chronic kidney disease), stage III   . Other specified hypothyroidism   . Anemia of chronic disease   . Pain in the chest   . Shortness of breath 05/23/2016  . Pre-syncope 05/23/2016  . Memory loss 03/14/2015  . Chest tightness 09/16/2014  . Osteoarthritis of right knee 07/02/2014  . Acute medial meniscus tear of right knee 07/02/2014  . Diabetic peripheral neuropathy associated with type 1 diabetes mellitus ([place]) 09/05/2013  . Edema of extremities 04/18/2013  . Cellulitis 04/18/2013  . HTN (hypertension) 01/06/2012  . Acute renal failure,admitted 01/03/12, after admission for diastolic chf 28/78/6767  . Acute diastolic chf 3/1 to 2/0/94 12/27/2011  . PAF post CABG, NSR now 12/24/2011  . S/P CABG (coronary artery bypass graft), 12/04/11 12/07/2011  . Hyperlipidemia LDL goal <70 12/03/2011    Class: Diagnosis of  . NSTEMI (non-ST elevated myocardial infarction) ([place]) 12/01/2011  . Family history of early CAD 12/01/2011  . Diabetes mellitus, type 2 ([place]) 12/14/2010  . DYSPHAGIA  UNSPECIFIED 12/14/2010  . ABDOMINAL PAIN, EPIGASTRIC 12/14/2010  . ESOPHAGEAL STRICTURE 11/28/2009  . GERD 11/28/2009  . FLATULENCE-GAS-BLOATING 11/28/2009  . CONSTIPATION, CHRONIC 12/19/2007  . SALPINGO-OOPHORITIS 12/19/2007  . MENOPAUSE, SURGICAL 12/19/2007  . BACK PAIN, LUMBAR 12/19/2007  . ANKLE INJURY, RIGHT 12/19/2007  . TOTAL KNEE REPLACEMENT, LEFT, HX OF 12/19/2007    Past Surgical History:  Procedure Laterality Date  . ABDOMINAL HYSTERECTOMY  1980's  .  BACK SURGERY  2006   "cyst growing near my spine"  . CARDIAC CATHETERIZATION  12/02/2011   mild LV dysfunction with mod hypocontractility of mid-distal anterolateral wall; CAD w/ostial tapering of L [place] with 50% diffuse ostial narrowing of LAD, 99% eccentric focal prox LAD stenosis followed by 70% prox LAD stenosis after 1st diag, 20% mid LAD narrowing; 80% ostial-to-prox L Cfx stenosis & 40-50% irregularity of RCA (Dr. Corky Downs)  . CATARACT EXTRACTION W/ INTRAOCULAR LENS  IMPLANT, BILATERAL  ~ 2010  . [place]  . CORONARY ARTERY BYPASS GRAFT  12/04/2011   Procedure: CORONARY ARTERY BYPASS GRAFTING (CABG);  Surgeon: Tharon Aquas Adelene Idler, MD;  Location: [facility];  Service: Open Heart Surgery;  Laterality: N/A;  CABG x three,  using left internal mammary artery, and right leg greater saphenous vein harvested endoscopically  . DILATION AND CURETTAGE OF UTERUS     "a couple times"  . FRACTURE SURGERY     "put pins both side right ankle"  . JOINT REPLACEMENT    . KNEE ARTHROSCOPY WITH MEDIAL MENISECTOMY Right 07/02/2014   Procedure: RIGHT KNEE ARTHROSCOPY WITH PARTIAL MEDIAL MENISTECTOMY, ABRASION CONDROPLASTYU OF PATELLA,ABRASION CONDROPLASTY OF MEDIAL FEMEROL CONDYL, MICROFRACTURE OF MEDIAL FEMEROL CONDYL;  Surgeon: Tobi Bastos, MD;  Location: [facility];  Service: Orthopedics;  Laterality: Right;  . LEFT HEART CATHETERIZATION WITH CORONARY ANGIOGRAM N/A 12/02/2011   Procedure: LEFT HEART CATHETERIZATION WITH CORONARY ANGIOGRAM;  Surgeon: Troy Sine, MD;  Location[facility] CATH LAB;  Service: Cardiovascular;  Laterality: N/A;  Coronary angiogram, possible PCI  . TONSILLECTOMY  1949  . TOTAL KNEE ARTHROPLASTY  ~ 2006   left  . TRANSTHORACIC ECHOCARDIOGRAM  02/19/2013   EF 78-67%, grade 1 diastolic dysfunction; mildly thickend/calcified AV leaflets; mildly calcidied MV annulus; mild TR    OB History    No data available       Home Medications    Prior to Admission medications     Medication Sig Start Date End Date Taking? Authorizing Provider  amLODipine (NORVASC) 10 MG tablet TAKE ONE TABLET BY MOUTH ONCE DAILY IN THE MORNING Patient taking differently: Take 10 mg by mouth once daily 05/24/16  Yes [name], MD  Cholecalciferol (VITAMIN D3 PO) Take 1 capsule by mouth daily.   Yes Historical Provider, MD  Cyanocobalamin (VITAMIN B-12 PO) Take 1 tablet by mouth daily.   Yes Historical Provider, MD  doxazosin (CARDURA) 1 MG tablet Take 1 tablet (1 mg total) by mouth daily. 08/02/16  Yes Rogelia Mire, NP  esomeprazole (NEXIUM) 20 MG capsule Take 1 capsule (20 mg total) by mouth daily at 12 noon. 04/13/16  Yes Irene Shipper, MD  furosemide (LASIX) 20 MG tablet Take 20 mg by mouth daily.    Yes Historical Provider, MD  gabapentin (NEURONTIN) 300 MG capsule Take 300-600 mg by mouth See admin instructions. Take 300 mg by mouth in the morning and take 600 mg by mouth at bedtime   Yes Historical Provider, MD  insulin glargine (LANTUS) 100 UNIT/ML injection  Inject 14 Units into the skin every morning.    Yes Historical Provider, MD  levothyroxine (SYNTHROID, LEVOTHROID) 50 MCG tablet Take 50 mcg by mouth daily before breakfast.    Yes Historical Provider, MD  Multiple Vitamins-Minerals (PRESERVISION AREDS 2 PO) Take 1 tablet by mouth daily.   Yes Historical Provider, MD  Polyethyl Glycol-Propyl Glycol (SYSTANE OP) Place 1 drop into both eyes 2 (two) times daily.    Yes Historical Provider, MD  sitaGLIPtin-metformin (JANUMET) 50-1000 MG per tablet Take 1 tablet by mouth 2 (two) times daily with a meal.   Yes Historical Provider, MD  vitamin C (ASCORBIC ACID) 500 MG tablet Take 500 mg by mouth daily.   Yes Historical Provider, MD  VITAMIN E PO Take 1 capsule by mouth daily.   Yes Historical Provider, MD  meclizine (ANTIVERT) 25 MG tablet Take 25 mg by mouth daily as needed for dizziness. For dizziness    Historical Provider, MD    Family History Family History  Problem  Relation Age of Onset  . Diabetes Mother     also, HTN & CVA  . Heart disease Father     also hyperlipidemia  . Breast cancer Sister     x 3  . Heart disease Brother     x5; one with MI  . Heart disease Sister     x3  . Diabetes Sister     x3  . Lung cancer Sister   . Breast cancer Sister     x2  . Colon cancer Neg Hx     Social History Social History  Substance Use Topics  . Smoking status: Never Smoker  . Smokeless tobacco: Never Used  . Alcohol use 0.0 oz/week     Comment: very occasional      Allergies   Clonidine derivatives; Crestor [rosuvastatin calcium]; Epinephrine; Hydralazine; Losartan; Other; and Sulfa antibiotics   Review of Systems Review of Systems  Constitutional: Positive for fatigue. Negative for chills, diaphoresis and fever.  Respiratory: Negative for cough, chest tightness, shortness of breath and wheezing.   Cardiovascular: Positive for palpitations. Negative for chest pain and leg swelling.  Gastrointestinal: Negative for abdominal pain, constipation, diarrhea, nausea and vomiting.  Genitourinary: Negative for dysuria and frequency.  Neurological: Positive for headaches.  All other systems reviewed and are negative.    Physical Exam Updated Vital Signs BP (!) 155/82 (BP Location: Right Arm)   Pulse (!) 127   Temp 97.8 F (36.6 C) (Oral)   Resp 16   Ht 5' (1.524 m)   Wt 60.8 kg   SpO2 97%   BMI 26.17 kg/m   Physical Exam  Constitutional: She is oriented to person, place, and time. She appears well-developed and well-nourished. No distress.  HENT:  Head: Normocephalic and atraumatic.  Eyes: Conjunctivae and EOM are normal. Pupils are equal, round, and reactive to light.  Neck: Normal range of motion. Neck supple. No thyromegaly present.  Cardiovascular: Normal heart sounds and intact distal pulses.  Exam reveals no gallop and no friction rub.   No murmur heard. Tachycardic rate, irregular  Pulmonary/Chest: Effort normal and  breath sounds normal. No respiratory distress. She has no wheezes. She has no rales.  Abdominal: Soft. Bowel sounds are normal. She exhibits no distension. There is no tenderness. There is no guarding.  Musculoskeletal: Normal range of motion. She exhibits no edema or deformity.  Neurological: She is alert and oriented to person, place, and time.  Poor hearing bilaterally  Skin: Skin  is warm and dry. Capillary refill takes less than 2 seconds. No rash noted. She is not diaphoretic. No erythema.  Psychiatric: She has a normal mood and affect. Her behavior is normal. Thought content normal.     ED Treatments / Results  Labs (all labs ordered are listed, but only abnormal results are displayed) Labs Reviewed  BASIC METABOLIC PANEL - Abnormal; Notable for the following:       Result Value   CO2 20 (*)    Glucose, Bld 195 (*)    BUN 25 (*)    Creatinine, Ser 1.39 (*)    GFR calc non Af Amer 36 (*)    GFR calc Af Amer 42 (*)    All other components within normal limits  LIPID PANEL - Abnormal; Notable for the following:    Cholesterol 330 (*)    Triglycerides 313 (*)    VLDL 63 (*)    LDL Cholesterol 225 (*)    All other components within normal limits  TROPONIN I - Abnormal; Notable for the following:    Troponin I 0.07 (*)    All other components within normal limits  CBG MONITORING, ED - Abnormal; Notable for the following:    Glucose-Capillary 166 (*)    All other components within normal limits  MRSA PCR SCREENING  CBC  TSH  HEPARIN LEVEL (UNFRACTIONATED)  CBC  BASIC METABOLIC PANEL  TROPONIN I  TROPONIN I  HEMOGLOBIN A1C  I-STAT TROPOININ, ED    EKG  EKG Interpretation  Date/Time:  Sunday August 08 2016 15:54:04 EDT Ventricular Rate:  126 PR Interval:    QRS Duration: 78 QT Interval:  279 QTC Calculation: 404 R Axis:   99 Text Interpretation:  Sinus or ectopic atrial tachycardia Probable anterior infarct, age indeterminate sinus tachycardia  Confirmed by YAO   MD, DAVID ([id number]) on 08/08/2016 6:03:38 PM       Radiology Dg Chest 2 View  Result Date: 08/08/2016 CLINICAL DATA:  Shortness of breath. EXAM: CHEST  2 VIEW COMPARISON:  Two-view chest x-ray 05/23/2016. FINDINGS: The heart is enlarged. There is no edema or effusion to suggest failure. Atherosclerotic changes are present at the aortic arch. No focal airspace disease is present. The visualized soft tissues and bony thorax are unremarkable. IMPRESSION: Stable cardiomegaly without failure. Electronically Signed   By: San Morelle M.D.   On: 08/08/2016 16:41    Procedures Procedures (including critical care time)  Medications Ordered in ED Medications  doxazosin (CARDURA) tablet 1 mg (not administered)  insulin glargine (LANTUS) injection 14 Units (not administered)  pantoprazole (PROTONIX) EC tablet 40 mg (not administered)  multivitamin (PROSIGHT) tablet 1 tablet (not administered)  gabapentin (NEURONTIN) capsule 600 mg (600 mg Oral Given 08/08/16 2212)  hydroxypropyl methylcellulose / hypromellose (ISOPTO TEARS / GONIOVISC) 2.5 % ophthalmic solution ( Both Eyes Not Given 08/08/16 2215)  levothyroxine (SYNTHROID, LEVOTHROID) tablet 50 mcg (not administered)  acetaminophen (TYLENOL) tablet 650 mg (not administered)    Or  acetaminophen (TYLENOL) suppository 650 mg (not administered)  docusate sodium (COLACE) capsule 100 mg (100 mg Oral Given 08/08/16 2209)  ondansetron (ZOFRAN) tablet 4 mg (not administered)    Or  ondansetron (ZOFRAN) injection 4 mg (not administered)  insulin aspart (novoLOG) injection 0-15 Units (not administered)  sodium chloride flush (NS) 0.9 % injection 3 mL (3 mLs Intravenous Given 08/08/16 2213)  diltiazem (CARDIZEM) 100 mg in dextrose 5% 146mL (1 mg/mL) infusion (10 mg/hr Intravenous Rate/Dose Change 08/09/16 0009)  aspirin chewable  tablet 81 mg (81 mg Oral Given 08/08/16 2209)  heparin ADULT infusion 100 units/mL (25000 units/282mL sodium chloride  0.45%) (800 Units/hr Intravenous New Bag/Given 08/08/16 2210)  gabapentin (NEURONTIN) capsule 300 mg (not administered)  metoprolol (LOPRESSOR) injection 5 mg (5 mg Intravenous Given 08/08/16 1705)  amLODipine (NORVASC) tablet 10 mg (10 mg Oral Given 08/08/16 1704)  diltiazem (CARDIZEM) 1 mg/mL load via infusion 15 mg (15 mg Intravenous Bolus from Bag 08/08/16 2027)  heparin bolus via infusion 3,000 Units (3,000 Units Intravenous Bolus from Bag 08/08/16 2210)     Initial Impression / Assessment and Plan / ED Course  I have reviewed the triage vital signs and the nursing notes.  Pertinent labs & imaging results that were available during my care of the patient were reviewed by me and considered in my medical decision making (see chart for details).  Clinical Course    Ms. Seat is 75 yo woman with PMH poorly-controlled HTN, CAD< DM2, dCHF, CKD3 presenting with hypertensive urgency and tachycardia. BP up to 211/76 and HR in 120-140s. Exam unremarkable. Initial EKG concerning for afib RVR - check basic labs, CXR, troponin, TSH, will give 5 mg IV Metoprolol and 10mg  po Norvasc.  Patient feeling better after dose of Metoprolol and eating some food, HTN improving to SBP approx 160, repeat EKG showing sinus tach with non-specific T wave changes in lateral leads. Will admit for hypertensive urgency, EKG changes, and a-fib RVR will require rate control and anticoag (CHADS2-VASc >2).    Final Clinical Impressions(s) / ED Diagnoses   Final diagnoses:  Essential hypertension  Sinus tachycardia    New Prescriptions Current Discharge Medication List       Asencion Partridge, MD 08/09/16 0998    Drenda Freeze, MD 08/09/16 [phone]

## 2016-08-08 NOTE — ED Triage Notes (Signed)
Pt here with family who reports pt has had irregular blood pressure for the last week. Recent change in BP meds. Pt was having pulse rate in the 50's and it is now up to 143. Pt has unsteady gait which family reports is abnormal.

## 2016-08-08 NOTE — H&P (Addendum)
History and Physical    [name] HCW:[id number] DOB: 11-09-1940 DOA: 08/08/2016  PCP:  [facility] [place] - has appt Tuesday Consultants:  [facility] - cardiology Patient coming from: home - lives alone; daughter, [phone]  Chief Complaint: HTN, tachycardia  HPI: [name] is a 75 y.o. female with medical history significant of CAD (s/p CABG in 2013), DM, HTN, diastolic CHF, CKD, and hypothyroidism presenting with issues with BP since last hospitalization.  Very sensitive to medications, lots of side effects.  The last medication, Clonidine, either caused bradycardia or BP was too high.  Changed to doxazosin with Norvasc while coming off Clonidine, but in the last few days BP increasing and pulse increasing.  Feeling very shaky, weak.  Also feels weak from other medications.  Denies chest pain.  Occasional SOB.  Headaches around R eye and temporal region for the last few weeks, comes and goes.  Occasional blurry vision, worsening - unsure how long this has been going on.  Chronic neuropathy in feet and legs.  Dizziness, takes Meclizine.  A bit unsteady on her feet recently.  CAGB 2013.  Last stress test was Jun 03, 2016.  Normal.  Last cath was 2013.   ED Course:  Per Dr. Wynetta Emery: Tachycardia, hypertensive urgency with vague complaints, palpitations/malasie, initial EKG irregular, concern for afib RVR - check basic labs, CXR, troponin, TSH, 5 mg IV metoprolol, +10 po Norvasc, likely admit for hypertensive urgency and afib rvr  Review of Systems: As per HPI; otherwise 10 point review of systems reviewed and negative.   Ambulatory Status:  Ambulates independently  Past Medical History:  Diagnosis Date  . Arthritis    "in my hands"  . Bronchitis   . Chronic diastolic CHF (congestive heart failure) ([place])    a. 05/2016 Echo: EF 60-65%, no rwma, Gr1 DD, Ao sclerosis w/o stenosis, triv MR.  . CKD (chronic kidney disease), stage III   . Coronary artery disease    a.  02/2007 Persantine MV: low risk;  b. 11/2011 CABG x 3 (LIMA->LAD, VG->OM, VG->RCA);  c. 05/2016 MV: EF >65%, no isch/infarct.  . Depression   . Diabetes mellitus   . Diabetic neuropathy ([place])   . Diverticulosis   . Esophageal stricture   . GERD (gastroesophageal reflux disease)   . Hemorrhoids   . Hiatal hernia   . History of stomach ulcers 1970's  . Hyperlipidemia   . Hypertensive heart disease   . Hypothyroidism   . Pain    RIGHT KNEE PAIN - TORN RIGHT MEDIAL MENISCUS  . Pneumonia   . PONV (postoperative nausea and vomiting)   . S/P CABG (coronary artery bypass graft), 12/04/11 12/07/2011   LIMA to LAD, SVG to OM, SVG to RCA    Past Surgical History:  Procedure Laterality Date  . ABDOMINAL HYSTERECTOMY  1980's  . BACK SURGERY  2006   "cyst growing near my spine"  . CARDIAC CATHETERIZATION  12/02/2011   mild LV dysfunction with mod hypocontractility of mid-distal anterolateral wall; CAD w/ostial tapering of L [place] with 50% diffuse ostial narrowing of LAD, 99% eccentric focal prox LAD stenosis followed by 70% prox LAD stenosis after 1st diag, 20% mid LAD narrowing; 80% ostial-to-prox L Cfx stenosis & 40-50% irregularity of RCA (Dr. Corky Downs)  . CATARACT EXTRACTION W/ INTRAOCULAR LENS  IMPLANT, BILATERAL  ~ 2010  . [place]  . CORONARY ARTERY BYPASS GRAFT  12/04/2011   Procedure: CORONARY ARTERY BYPASS GRAFTING (CABG);  Surgeon: Tharon Aquas Adelene Idler, MD;  Location: [facility];  Service: Open Heart Surgery;  Laterality: N/A;  CABG x three,  using left internal mammary artery, and right leg greater saphenous vein harvested endoscopically  . DILATION AND CURETTAGE OF UTERUS     "a couple times"  . FRACTURE SURGERY     "put pins both side right ankle"  . JOINT REPLACEMENT    . KNEE ARTHROSCOPY WITH MEDIAL MENISECTOMY Right 07/02/2014   Procedure: RIGHT KNEE ARTHROSCOPY WITH PARTIAL MEDIAL MENISTECTOMY, ABRASION CONDROPLASTYU OF PATELLA,ABRASION CONDROPLASTY OF MEDIAL FEMEROL CONDYL,  MICROFRACTURE OF MEDIAL FEMEROL CONDYL;  Surgeon: Tobi Bastos, MD;  Location: [facility];  Service: Orthopedics;  Laterality: Right;  . LEFT HEART CATHETERIZATION WITH CORONARY ANGIOGRAM N/A 12/02/2011   Procedure: LEFT HEART CATHETERIZATION WITH CORONARY ANGIOGRAM;  Surgeon: Troy Sine, MD;  Location: [facility] C[facility];  Service: Cardiovascular;  Laterality: N/A;  Coronary angiogram, possible PCI  . TONSILLECTOMY  1949  . TOTAL KNEE ARTHROPLASTY  ~ 2006   left  . TRANSTHORACIC ECHOCARDIOGRAM  02/19/2013   EF 30-07%, grade 1 diastolic dysfunction; mildly thickend/calcified AV leaflets; mildly calcidied MV annulus; mild TR    Social History   Social History  . Marital status: Widowed    Spouse name: N/A  . Number of children: 2  . Years of education: 12   Occupational History  . retired    Social History Main Topics  . Smoking status: Never Smoker  . Smokeless tobacco: Never Used  . Alcohol use 0.0 oz/week     Comment: very occasional   . Drug use: No  . Sexual activity: No   Other Topics Concern  . Not on file   Social History Narrative  . No narrative on file    Allergies  Allergen Reactions  . Clonidine Derivatives Other (See Comments)    Bradycardia and fatigue   . Crestor [Rosuvastatin Calcium] Other (See Comments)    Other reaction(s): tired and weak  . Epinephrine Other (See Comments)    Abnormal feeling. Dental exam/injection of local w/ epi.  Marland Kitchen Hydralazine Other (See Comments)    Nausea/gi upset   . Losartan Other (See Comments)    Hyperkalemia   . Other Other (See Comments)    MANGO'S - WHELPS ALL OVER  . Sulfa Antibiotics Other (See Comments)    Unknown    Family History  Problem Relation Age of Onset  . Diabetes Mother     also, HTN & CVA  . Heart disease Father     also hyperlipidemia  . Breast cancer Sister     x 3  . Heart disease Brother     x5; one with MI  . Heart disease Sister     x3  . Diabetes Sister     x3  . Lung cancer Sister    . Breast cancer Sister     x2  . Colon cancer Neg Hx     Prior to Admission medications   Medication Sig Start Date End Date Taking? Authorizing Provider  amLODipine (NORVASC) 10 MG tablet TAKE ONE TABLET BY MOUTH ONCE DAILY IN THE MORNING Patient taking differently: Take 10 mg by mouth once daily 05/24/16  Yes [name], MD  Cholecalciferol (VITAMIN D3 PO) Take 1 capsule by mouth daily.   Yes Historical Provider, MD  Cyanocobalamin (VITAMIN B-12 PO) Take 1 tablet by mouth daily.   Yes Historical Provider, MD  doxazosin (CARDURA) 1 MG tablet Take 1  tablet (1 mg total) by mouth daily. 08/02/16  Yes Rogelia Mire, NP  esomeprazole (NEXIUM) 20 MG capsule Take 1 capsule (20 mg total) by mouth daily at 12 noon. 04/13/16  Yes Irene Shipper, MD  furosemide (LASIX) 20 MG tablet Take 20 mg by mouth daily.    Yes Historical Provider, MD  gabapentin (NEURONTIN) 300 MG capsule Take 300-600 mg by mouth See admin instructions. Take 300 mg by mouth in the morning and take 600 mg by mouth at bedtime   Yes Historical Provider, MD  insulin glargine (LANTUS) 100 UNIT/ML injection Inject 14 Units into the skin every morning.    Yes Historical Provider, MD  levothyroxine (SYNTHROID, LEVOTHROID) 50 MCG tablet Take 50 mcg by mouth daily before breakfast.    Yes Historical Provider, MD  Multiple Vitamins-Minerals (PRESERVISION AREDS 2 PO) Take 1 tablet by mouth daily.   Yes Historical Provider, MD  Polyethyl Glycol-Propyl Glycol (SYSTANE OP) Place 1 drop into both eyes 2 (two) times daily.    Yes Historical Provider, MD  sitaGLIPtin-metformin (JANUMET) 50-1000 MG per tablet Take 1 tablet by mouth 2 (two) times daily with a meal.   Yes Historical Provider, MD  vitamin C (ASCORBIC ACID) 500 MG tablet Take 500 mg by mouth daily.   Yes Historical Provider, MD  VITAMIN E PO Take 1 capsule by mouth daily.   Yes Historical Provider, MD  meclizine (ANTIVERT) 25 MG tablet Take 25 mg by mouth daily as needed for  dizziness. For dizziness    Historical Provider, MD    Physical Exam: Vitals:   08/08/16 1800 08/08/16 1900 08/08/16 1930 08/08/16 2000  BP: 159/99 145/74 159/84 152/92  Pulse: 119  (!) 125 (!) 124  Resp: 21  19 26   Temp:      TempSrc:      SpO2: 99%  98% 98%  Weight:      Height:         General:  Appears calm and comfortable and is NAD Eyes:  PERRL, EOMI, normal lids, iris ENT:  grossly normal hearing, lips & tongue, mmm Neck:  no LAD, masses or thyromegaly Cardiovascular:  Irregularly irregular rate/rhythm with tachycardia, no m/r/g. No LE edema.  Respiratory:  CTA bilaterally, no w/r/r. Normal respiratory effort. Abdomen: soft, ntnd, NABS Skin:  no rash or induration seen on limited exam Musculoskeletal:  grossly normal tone BUE/BLE, good ROM, no bony abnormality Psychiatric:  grossly normal mood and affect, speech fluent and appropriate, AOx3 Neurologic:  CN 2-12 grossly intact, moves all extremities in coordinated fashion, sensation intact  Labs on Admission: I have personally reviewed following labs and imaging studies  CBC:  Recent Labs Lab 08/08/16 1426  WBC 8.0  HGB 12.6  HCT 36.8  MCV 90.6  PLT 784   Basic Metabolic Panel:  Recent Labs Lab 08/08/16 1426  NA 140  K 5.1  CL 108  CO2 20*  GLUCOSE 195*  BUN 25*  CREATININE 1.39*  CALCIUM 9.4   GFR: Estimated Creatinine Clearance: 28 mL/min (by C-G formula based on SCr of 1.39 mg/dL (H)). Liver Function Tests: No results for input(s): AST, ALT, ALKPHOS, BILITOT, PROT, ALBUMIN in the last 168 hours. No results for input(s): LIPASE, AMYLASE in the last 168 hours. No results for input(s): AMMONIA in the last 168 hours. Coagulation Profile: No results for input(s): INR, PROTIME in the last 168 hours. Cardiac Enzymes: No results for input(s): CKTOTAL, CKMB, CKMBINDEX, TROPONINI in the last 168 hours. BNP (last 3  results) No results for input(s): PROBNP in the last 8760 hours. HbA1C: No results for  input(s): HGBA1C in the last 72 hours. CBG:  Recent Labs Lab 08/08/16 1434  GLUCAP 166*   Lipid Profile: No results for input(s): CHOL, HDL, LDLCALC, TRIG, CHOLHDL, LDLDIRECT in the last 72 hours. Thyroid Function Tests:  Recent Labs  08/08/16 1606  TSH 2.306   Anemia Panel: No results for input(s): VITAMINB12, FOLATE, FERRITIN, TIBC, IRON, RETICCTPCT in the last 72 hours. Urine analysis:    Component Value Date/Time   COLORURINE YELLOW 01/04/2012 1105   APPEARANCEUR CLOUDY (A) 01/04/2012 1105   LABSPEC 1.015 01/04/2012 1105   PHURINE 5.0 01/04/2012 1105   GLUCOSEU NEGATIVE 01/04/2012 1105   HGBUR NEGATIVE 01/04/2012 1105   BILIRUBINUR NEGATIVE 01/04/2012 1105   KETONESUR NEGATIVE 01/04/2012 1105   PROTEINUR NEGATIVE 01/04/2012 1105   UROBILINOGEN 0.2 01/04/2012 1105   NITRITE NEGATIVE 01/04/2012 1105   LEUKOCYTESUR MODERATE (A) 01/04/2012 1105    Creatinine Clearance: Estimated Creatinine Clearance: 28 mL/min (by C-G formula based on SCr of 1.39 mg/dL (H)).  Sepsis Labs: @LABRCNTIP (procalcitonin:4,lacticidven:4) )No results found for this or any previous visit (from the past 240 hour(s)).   Radiological Exams on Admission: Dg Chest 2 View  Result Date: 08/08/2016 CLINICAL DATA:  Shortness of breath. EXAM: CHEST  2 VIEW COMPARISON:  Two-view chest x-ray 05/23/2016. FINDINGS: The heart is enlarged. There is no edema or effusion to suggest failure. Atherosclerotic changes are present at the aortic arch. No focal airspace disease is present. The visualized soft tissues and bony thorax are unremarkable. IMPRESSION: Stable cardiomegaly without failure. Electronically Signed   By: San Morelle M.D.   On: 08/08/2016 16:41    EKG: Independently reviewed.  Afib with rate 126-129; nonspecific ST changes with no evidence of acute ischemia  Assessment/Plan Principal Problem:   Atrial fibrillation (HCC) Active Problems:   Diabetes mellitus, type 2 (HCC)    Hyperlipidemia LDL goal <70   Coronary artery disease   CKD (chronic kidney disease), stage III   Hypertensive urgency   Atrial fibrillation -Patient presenting with new-onset afib (she did have apparent afib following her CABG but converted to NSR and has not had recurrence). -Etiology is thought to be related to HTN and/or ischemic heart disease. -EKGs reviewed with Dr. Radford Pax, who agrees; will request cardiology consult in AM (Patient sees Dr. Debara Pickett). -Since the afib onset is unknown, will focus on rate control and searching for the underlying cause at this time. -Will admit to SDU for Diltiazem drip as per protocol with plan to transition to PO Diltiazem once heart rate is controlled.   -Will cycle cardiac enzymes.   -Patient does not complain of chest pain and so ACS is of little concern at this time. -Will request Echocardiogram for further evaluation and also order TSH.   -Will also risk stratify with FLP and HgbA1c.   -Will repeat EKG in AM and plan to consult cardiology at that time (inbox message sent to [facility]). -CHA2DS2-VASc Score is >2 and so patient will need oral anticoagulation.  -Patient started on treatment-dose Lovenox for now. -Since initially this was thought to be hypertensive crisis vs. Poorly controlled HTN, the initial orders were for Observation/telemetry.  The order was changed to Admit to SDU based on my interpretation of the EKG with cardiology confirmation.  -Hypertensive urgency vs. Poorly controlled HTN -Patient with vague symptoms of feeling weak and tired. -Either way, patient is being admitted on a Cardizem drip for now for  rate control of afib; this is expected to improve HTN. -She has a h/o allergies and sensitivities to various medications, making HTN control difficult. -She is currently on an alpha-blocker (new) and CCB. -She had difficulty with ARB therapy due to hyperkalemia/[name], but in review of the chart it seems that she did well on low-dose  (it just wasn't sufficient to control her BP).  Would suggest consideration of addition of low-dose ACE/ARB. -She had bradycardia from Clonidine and so beta blocker therapy may have similar issues. -She was "shaking from the inside out" with hydralazine and so this is likely not a good option for her. -Her creatinine makes diuretic therapy suboptimal. -Aliskiren (Tekturna) alone or in combination may be an option, but hyperkalemia can result from these medications too. -For now, the Cardizem drip is likely to be a temporary solution. -As the patient nears discharge, additional consideration about her options will be important but challenging.  DM -Hold Janumet but continue Lantus -Cover with SSI  CAD -h/o CABG in 2013. -Does not appear to be taking ASA daily, will add  HLD -No apparent recent lipid panel -Not apparently taking a statin -LDL goal should be <70 -Will check FLP  CKD -Current creatinine of 1.39 appears to be better than usual baseline of 1.5 -Will trend    DVT prophylaxis: Treatment-dose Lovenox Code Status: Full - confirmed with patient/family Family Communication: Daughter present throughout  Disposition Plan: Home once clinically improved Consults called: Cardiology (via [employer])  Admission status: Admit to SDU   Karmen Bongo MD [facility]  If 7PM-7AM, please contact night-coverage [email or web address] Password [email or web address]  08/08/2016, 8:45 PM   Addendum: 10/15 at 9:31PM - I went back to talk to the patient and her daughter and explain about the afib.  She had just arrived on the floor and had another EKG, which appeared to show lateral lead ST depression which is similar to her initial EKG but is much more pronounced than the later ones- it may be rate-related but is concerning for ischemia (still chest pain-free).  Troponin is pending; if elevated, would suggest cardiology consult tonight. However, she will be on treatment-dose Heparin (prefers  heparin drip over Lovenox injections) and so further intervention may not be needed.  Finally, the patient does have financial challenges and is unlikely to be able to afford NOAC therapy and so Coumadin is likely her best option for long-term anticoagulation.  However, she seems to have mild memory loss/cognitive impairment which may make this all somewhat difficult for her to understand.  JEY

## 2016-08-08 NOTE — ED Notes (Signed)
Attempted report x1. 

## 2016-08-08 NOTE — Progress Notes (Signed)
ANTICOAGULATION CONSULT NOTE - Follow Up Consult  Pharmacy Consult for Heparin Indication: atrial fibrillation  Allergies  Allergen Reactions  . Clonidine Derivatives Other (See Comments)    Bradycardia and fatigue   . Crestor [Rosuvastatin Calcium] Other (See Comments)    Other reaction(s): tired and weak  . Epinephrine Other (See Comments)    Abnormal feeling. Dental exam/injection of local w/ epi.  Marland Kitchen Hydralazine Other (See Comments)    Nausea/gi upset   . Losartan Other (See Comments)    Hyperkalemia   . Other Other (See Comments)    MANGO'S - WHELPS ALL OVER  . Sulfa Antibiotics Other (See Comments)    Unknown    Patient Measurements: Height: 5' (152.4 cm) Weight: 134 lb (60.8 kg) IBW/kg (Calculated) : 45.5 Heparin Dosing Weight: 58 kg  Vital Signs: Temp: 97.8 F (36.6 C) (10/15 1423) Temp Source: Oral (10/15 1423) BP: 150/79 (10/15 2030) Pulse Rate: 124 (10/15 2030)  Labs:  Recent Labs  08/08/16 1426  HGB 12.6  HCT 36.8  PLT 193  CREATININE 1.39*    Estimated Creatinine Clearance: 28 mL/min (by C-G formula based on SCr of 1.39 mg/dL (H)).   Medications:  Infusions:  . diltiazem (CARDIZEM) infusion 7.5 mg/hr (08/08/16 2100)    Assessment: 75 year old female admitted with new onset atrial fibrillation.  She initially had Lovenox ordered for anticoagulation and a request is received to change to Heparin.  She did not receive any Lovenox doses, so it is ok to start Heparin at this time.  Goal of Therapy:  Heparin level 0.3-0.7 units/ml Monitor platelets by anticoagulation protocol: Yes   Plan:  Heparin 3000 units IV bolus Heparin infusion at 800 units/hr Check Heparin level and CBC in 8 hours and daily  Legrand Como, Pharm.D., BCPS, AAHIVP Clinical Pharmacist Phone: [phone] or [phone] 08/08/2016, 9:44 PM

## 2016-08-08 NOTE — Progress Notes (Signed)
ANTICOAGULATION CONSULT NOTE - Initial Consult  Pharmacy Consult for Lovenox Indication: atrial fibrillation  Allergies  Allergen Reactions  . Clonidine Derivatives Other (See Comments)    Bradycardia and fatigue   . Crestor [Rosuvastatin Calcium] Other (See Comments)    Other reaction(s): tired and weak  . Epinephrine Other (See Comments)    Abnormal feeling. Dental exam/injection of local w/ epi.  Marland Kitchen Hydralazine Other (See Comments)    Nausea/gi upset   . Losartan Other (See Comments)    Hyperkalemia   . Other Other (See Comments)    MANGO'S - WHELPS ALL OVER  . Sulfa Antibiotics Other (See Comments)    Unknown    Patient Measurements: Height: 5' (152.4 cm) Weight: 134 lb (60.8 kg) IBW/kg (Calculated) : 45.5 Heparin Dosing Weight:   Vital Signs: Temp: 97.8 F (36.6 C) (10/15 1423) Temp Source: Oral (10/15 1423) BP: 152/92 (10/15 2000) Pulse Rate: 124 (10/15 2000)  Labs:  Recent Labs  08/08/16 1426  HGB 12.6  HCT 36.8  PLT 193  CREATININE 1.39*    Estimated Creatinine Clearance: 28 mL/min (by C-G formula based on SCr of 1.39 mg/dL (H)).   Medical History: Past Medical History:  Diagnosis Date  . Arthritis    "in my hands"  . Bronchitis   . Chronic diastolic CHF (congestive heart failure) ([place])    a. 05/2016 Echo: EF 60-65%, no rwma, Gr1 DD, Ao sclerosis w/o stenosis, triv MR.  . CKD (chronic kidney disease), stage III   . Coronary artery disease    a. 02/2007 Persantine MV: low risk;  b. 11/2011 CABG x 3 (LIMA->LAD, VG->OM, VG->RCA);  c. 05/2016 MV: EF >65%, no isch/infarct.  . Depression   . Diabetes mellitus   . Diabetic neuropathy ([place])   . Diverticulosis   . Esophageal stricture   . GERD (gastroesophageal reflux disease)   . Hemorrhoids   . Hiatal hernia   . History of stomach ulcers 1970's  . Hyperlipidemia   . Hypertensive heart disease   . Hypothyroidism   . Pain    RIGHT KNEE PAIN - TORN RIGHT MEDIAL MENISCUS  . Pneumonia   . PONV  (postoperative nausea and vomiting)   . S/P CABG (coronary artery bypass graft), 12/04/11 12/07/2011   LIMA to LAD, SVG to OM, SVG to RCA    Medications:  F/u med rec  Assessment: 75 y/o F presents with variable HR and irregular BP. Concern for afib.  Vitals: BP 211/76, currently 152/92, HR 124 Labs: K=5.1. Scrr 1.39, CBC wNL  Goal of Therapy:  Anti-Xa level 0.6-1 units/ml 4hrs after LMWH dose given Monitor platelets by anticoagulation protocol: Yes   Plan:  Lovenox 60mg  SQ q24h with CrCl<30. CBC q72h   [name] S. Alford Highland, PharmD, BCPS Clinical Staff Pharmacist Pager [phone]  [name] 08/08/2016,8:12 PM

## 2016-08-09 ENCOUNTER — Inpatient Hospital Stay (HOSPITAL_COMMUNITY): Payer: Medicare Other

## 2016-08-09 ENCOUNTER — Encounter (HOSPITAL_COMMUNITY): Payer: Self-pay | Admitting: Physician Assistant

## 2016-08-09 DIAGNOSIS — I2510 Atherosclerotic heart disease of native coronary artery without angina pectoris: Secondary | ICD-10-CM

## 2016-08-09 DIAGNOSIS — R7989 Other specified abnormal findings of blood chemistry: Secondary | ICD-10-CM

## 2016-08-09 DIAGNOSIS — I160 Hypertensive urgency: Secondary | ICD-10-CM

## 2016-08-09 DIAGNOSIS — I4892 Unspecified atrial flutter: Secondary | ICD-10-CM

## 2016-08-09 DIAGNOSIS — R778 Other specified abnormalities of plasma proteins: Secondary | ICD-10-CM

## 2016-08-09 DIAGNOSIS — I5033 Acute on chronic diastolic (congestive) heart failure: Secondary | ICD-10-CM

## 2016-08-09 DIAGNOSIS — I5032 Chronic diastolic (congestive) heart failure: Secondary | ICD-10-CM

## 2016-08-09 LAB — CBC
HCT: 33.3 % — ABNORMAL LOW (ref 36.0–46.0)
Hemoglobin: 11.5 g/dL — ABNORMAL LOW (ref 12.0–15.0)
MCH: 31.1 pg (ref 26.0–34.0)
MCHC: 34.5 g/dL (ref 30.0–36.0)
MCV: 90 fL (ref 78.0–100.0)
Platelets: 211 10*3/uL (ref 150–400)
RBC: 3.7 MIL/uL — ABNORMAL LOW (ref 3.87–5.11)
RDW: 13.1 % (ref 11.5–15.5)
WBC: 8 10*3/uL (ref 4.0–10.5)

## 2016-08-09 LAB — BASIC METABOLIC PANEL
Anion gap: 7 (ref 5–15)
BUN: 23 mg/dL — ABNORMAL HIGH (ref 6–20)
CO2: 22 mmol/L (ref 22–32)
Calcium: 9.1 mg/dL (ref 8.9–10.3)
Chloride: 112 mmol/L — ABNORMAL HIGH (ref 101–111)
Creatinine, Ser: 1.22 mg/dL — ABNORMAL HIGH (ref 0.44–1.00)
GFR calc Af Amer: 49 mL/min — ABNORMAL LOW (ref >60)
GFR calc non Af Amer: 42 mL/min — ABNORMAL LOW (ref >60)
Glucose, Bld: 119 mg/dL — ABNORMAL HIGH (ref 65–99)
Potassium: 4.3 mmol/L (ref 3.5–5.1)
Sodium: 141 mmol/L (ref 135–145)

## 2016-08-09 LAB — TROPONIN I
Troponin I: 0.07 ng/mL (ref <0.03)
Troponin I: 0.05 ng/mL (ref <0.03)

## 2016-08-09 LAB — GLUCOSE, CAPILLARY
Glucose-Capillary: 125 mg/dL — ABNORMAL HIGH (ref 65–99)
Glucose-Capillary: 145 mg/dL — ABNORMAL HIGH (ref 65–99)
Glucose-Capillary: 218 mg/dL — ABNORMAL HIGH (ref 65–99)
Glucose-Capillary: 144 mg/dL — ABNORMAL HIGH (ref 65–99)
Glucose-Capillary: 219 mg/dL — ABNORMAL HIGH (ref 65–99)

## 2016-08-09 LAB — HEPARIN LEVEL (UNFRACTIONATED)
Heparin Unfractionated: 0.62 IU/mL (ref 0.30–0.70)
Heparin Unfractionated: 0.56 IU/mL (ref 0.30–0.70)

## 2016-08-09 LAB — MRSA PCR SCREENING: MRSA by PCR: NEGATIVE

## 2016-08-09 MED ORDER — RIVAROXABAN 20 MG PO TABS: 20.0000 mg | ORAL_TABLET | Freq: Every day | ORAL | Status: DC

## 2016-08-09 MED ORDER — RIVAROXABAN 15 MG PO TABS
15.0000 mg | ORAL_TABLET | Freq: Every day | ORAL | Status: DC
Start: 2016-08-10 — End: 2016-08-10

## 2016-08-09 NOTE — Consult Note (Signed)
Cardiology Consultation Note    Patient ID: KALISSA GRAYS, MRN: [id number], DOB/AGE: 1941/09/23 75 y.o. Admit date: 08/08/2016   Date of Consult: 08/09/2016 Primary Physician: Ileana Roup, MD Primary Cardiologist: Dr. Debara Pickett  Chief Complaint: erratic HR Reason for Consultation: atrial flutter Requesting MD: Dr. Lorin Mercy  HPI: [name] is a 75 y.o. female with history of CAD (s/p CABG in 2013 with post-op PAF), chronic diastolic CHF, multiple medication intolerances, baseline bradycardia (not on BB due to this), hypertensive heart disease, DM c/b neuropathy, HTN, hyperlipidemia, CKD stage III, depression, hypothyroidism, esophageal stricture, GERD, very remote stomach ulcers, carotid artery disease (40-59% bilaterally 10/2015), mild cognitive impairment (followed by neurology) who presented to [facility] with tachycardia.  To recap, she was admitted over July 2017 with weakness, diaphoresis, hypertensive urgency, and shortness of breath. VQ scan ruled out PE. 2D echo 05/25/16: EF 60-65%, grade 1 DD, AV sclerosis without stenosis. Was discharged with OP nuc 06/03/16 which was low risk with normal perfusion & EF (with horizontal ST depression of 53mm noted during Lexiscan in I, II, V5 and V6 leads). More recently she has recently been followed in clinic due to high blood pressure. She was previously well-controlled on combination of amlodipine and losartan, but ARB had to be discontinued due to hyperkalemia. She was placed on hydralazine and Lasix but felt poorly on hydralazine ("Shaking from the inside out") so hydralazine was stopped and she was started on clonidine. This improved BP, however she noted fatigue, dizziness, and heart rates dropping into the 40s so her clonidine was reduced to 0.1 mg twice a day. She was seen in hypertension clinic on September 14 at which time she was reporting she was only taking 1/2 tablet of amlodipine due to lethargy so was advised to take 1/2 tab BID. She  continued to have significant fatigue and malaise with borderline low HR so at [facility] on 08/02/16 she requested to come off of clonidine. It was tapered down in lieu of starting low dose doxazosin with plans to increase if tolerated. Subsequent HRs have been in the 50s-60s per her report after being off clonidine for a few days.  She follows her BP and pulse rate regularly and yesterday noticed her pulse rate was much more elevated than usual. She felt jittery and felt her heart pounding so came to the ER where she was found to be in what looks like atrial flutter. She says this feeling has happened in the past as well. She denies any chest pain but says she's felt somewhat SOB on exertion ever since the summer time. She received 15mg  IV diltiazem and is now on a drip at 15mg /hr with minimal effect on HR. She denies any acute symptoms other than feeling "weak." BP was initially quite high up to 211/76, now down to 130/82. Labs notable for Hgb 11.5 (variable from 9-12 over 2017), LDL 225, troponin peak 0.07, Cr 1.22 (baseline appears 1.4-1.6), K 4.3. Denies any recent bleeding issues.  Past Medical History:  Diagnosis Date  . Arthritis    "in my hands"  . Bronchitis   . Carotid artery disease ([place])    a. 40-59% bilaterally 10/2015.  Marland Kitchen Chronic diastolic CHF (congestive heart failure) ([place])    a. 05/2016 Echo: EF 60-65%, no rwma, Gr1 DD, Ao sclerosis w/o stenosis, triv MR.  . CKD (chronic kidney disease), stage III   . Coronary artery disease    a. 02/2007 Persantine MV: low risk;  b. 11/2011 CABG x  3 (LIMA->LAD, VG->OM, VG->RCA);  c. 05/2016 MV: EF >65%, no isch/infarct, horiz ST dep in I, II, V5-V6.  Marland Kitchen Depression   . Diabetes mellitus   . Diabetic neuropathy ([place])   . Diverticulosis   . Esophageal stricture   . GERD (gastroesophageal reflux disease)   . Hemorrhoids   . Hiatal hernia   . History of stomach ulcers 1970's  . Hyperkalemia    a. ARB stopped due to this.  . Hyperlipidemia   . Hypertensive  heart disease   . Hypothyroidism   . Medication intolerance    multiple  . Mild cognitive impairment    a. seen by neurology.  . Pain    RIGHT KNEE PAIN - TORN RIGHT MEDIAL MENISCUS  . Pneumonia   . PONV (postoperative nausea and vomiting)   . S/P CABG (coronary artery bypass graft), 12/04/11 12/07/2011   LIMA to LAD, SVG to OM, SVG to RCA  . Sinus bradycardia    a. not on BB due to this.      Surgical History:  Past Surgical History:  Procedure Laterality Date  . ABDOMINAL HYSTERECTOMY  1980's  . BACK SURGERY  2006   "cyst growing near my spine"  . CARDIAC CATHETERIZATION  12/02/2011   mild LV dysfunction with mod hypocontractility of mid-distal anterolateral wall; CAD w/ostial tapering of L [place] with 50% diffuse ostial narrowing of LAD, 99% eccentric focal prox LAD stenosis followed by 70% prox LAD stenosis after 1st diag, 20% mid LAD narrowing; 80% ostial-to-prox L Cfx stenosis & 40-50% irregularity of RCA (Dr. Corky Downs)  . CATARACT EXTRACTION W/ INTRAOCULAR LENS  IMPLANT, BILATERAL  ~ 2010  . [place]  . CORONARY ARTERY BYPASS GRAFT  12/04/2011   Procedure: CORONARY ARTERY BYPASS GRAFTING (CABG);  Surgeon: Tharon Aquas Adelene Idler, MD;  Location: [place];  Service: Open Heart Surgery;  Laterality: N/A;  CABG x three,  using left internal mammary artery, and right leg greater saphenous vein harvested endoscopically  . DILATION AND CURETTAGE OF UTERUS     "a couple times"  . FRACTURE SURGERY     "put pins both side right ankle"  . JOINT REPLACEMENT    . KNEE ARTHROSCOPY WITH MEDIAL MENISECTOMY Right 07/02/2014   Procedure: RIGHT KNEE ARTHROSCOPY WITH PARTIAL MEDIAL MENISTECTOMY, ABRASION CONDROPLASTYU OF PATELLA,ABRASION CONDROPLASTY OF MEDIAL FEMEROL CONDYL, MICROFRACTURE OF MEDIAL FEMEROL CONDYL;  Surgeon: Tobi Bastos, MD;  Location: [facility];  Service: Orthopedics;  Laterality: Right;  . LEFT HEART CATHETERIZATION WITH CORONARY ANGIOGRAM N/A 12/02/2011   Procedure: LEFT HEART  CATHETERIZATION WITH CORONARY ANGIOGRAM;  Surgeon: Troy Sine, MD;  Location: [facility] CATH LAB;  Service: Cardiovascular;  Laterality: N/A;  Coronary angiogram, possible PCI  . TONSILLECTOMY  1949  . TOTAL KNEE ARTHROPLASTY  ~ 2006   left  . TRANSTHORACIC ECHOCARDIOGRAM  02/19/2013   EF 16-10%, grade 1 diastolic dysfunction; mildly thickend/calcified AV leaflets; mildly calcidied MV annulus; mild TR     Home Meds: Prior to Admission medications   Medication Sig Start Date End Date Taking? Authorizing Provider  amLODipine (NORVASC) 10 MG tablet TAKE ONE TABLET BY MOUTH ONCE DAILY IN THE MORNING Patient taking differently: Take 10 mg by mouth once daily 05/24/16  Yes [name], MD  Cholecalciferol (VITAMIN D3 PO) Take 1 capsule by mouth daily.   Yes Historical Provider, MD  Cyanocobalamin (VITAMIN B-12 PO) Take 1 tablet by mouth daily.   Yes Historical Provider, MD  doxazosin (CARDURA) 1  MG tablet Take 1 tablet (1 mg total) by mouth daily. 08/02/16  Yes Rogelia Mire, NP  esomeprazole (NEXIUM) 20 MG capsule Take 1 capsule (20 mg total) by mouth daily at 12 noon. 04/13/16  Yes Irene Shipper, MD  furosemide (LASIX) 20 MG tablet Take 20 mg by mouth daily.    Yes Historical Provider, MD  gabapentin (NEURONTIN) 300 MG capsule Take 300-600 mg by mouth See admin instructions. Take 300 mg by mouth in the morning and take 600 mg by mouth at bedtime   Yes Historical Provider, MD  insulin glargine (LANTUS) 100 UNIT/ML injection Inject 14 Units into the skin every morning.    Yes Historical Provider, MD  levothyroxine (SYNTHROID, LEVOTHROID) 50 MCG tablet Take 50 mcg by mouth daily before breakfast.    Yes Historical Provider, MD  Multiple Vitamins-Minerals (PRESERVISION AREDS 2 PO) Take 1 tablet by mouth daily.   Yes Historical Provider, MD  Polyethyl Glycol-Propyl Glycol (SYSTANE OP) Place 1 drop into both eyes 2 (two) times daily.    Yes Historical Provider, MD  sitaGLIPtin-metformin (JANUMET)  50-1000 MG per tablet Take 1 tablet by mouth 2 (two) times daily with a meal.   Yes Historical Provider, MD  vitamin C (ASCORBIC ACID) 500 MG tablet Take 500 mg by mouth daily.   Yes Historical Provider, MD  VITAMIN E PO Take 1 capsule by mouth daily.   Yes Historical Provider, MD  meclizine (ANTIVERT) 25 MG tablet Take 25 mg by mouth daily as needed for dizziness. For dizziness    Historical Provider, MD    Inpatient Medications:  . aspirin  81 mg Oral Daily  . docusate sodium  100 mg Oral BID  . doxazosin  1 mg Oral Daily  . gabapentin  300 mg Oral Daily  . gabapentin  600 mg Oral QHS  . hydroxypropyl methylcellulose / hypromellose   Both Eyes BID  . insulin aspart  0-15 Units Subcutaneous TID WC  . insulin glargine  14 Units Subcutaneous Daily  . levothyroxine  50 mcg Oral QAC breakfast  . multivitamin  1 tablet Oral Daily  . pantoprazole  40 mg Oral Daily  . sodium chloride flush  3 mL Intravenous Q12H   . diltiazem (CARDIZEM) infusion 15 mg/hr (08/09/16 1216)  . heparin 800 Units/hr (08/09/16 0600)    Allergies:  Allergies  Allergen Reactions  . Clonidine Derivatives Other (See Comments)    Bradycardia and fatigue   . Crestor [Rosuvastatin Calcium] Other (See Comments)    Other reaction(s): tired and weak  . Epinephrine Other (See Comments)    Abnormal feeling. Dental exam/injection of local w/ epi.  Marland Kitchen Hydralazine Other (See Comments)    Nausea/gi upset   . Losartan Other (See Comments)    Hyperkalemia   . Other Other (See Comments)    MANGO'S - WHELPS ALL OVER  . Sulfa Antibiotics Other (See Comments)    Unknown    Social History   Social History  . Marital status: Widowed    Spouse name: N/A  . Number of children: 2  . Years of education: 12   Occupational History  . retired    Social History Main Topics  . Smoking status: Never Smoker  . Smokeless tobacco: Never Used  . Alcohol use 0.0 oz/week     Comment: very occasional   . Drug use: No  .  Sexual activity: No   Other Topics Concern  . Not on file   Social History Narrative  .  No narrative on file     Family History  Problem Relation Age of Onset  . Diabetes Mother     also, HTN & CVA  . Heart disease Father     also hyperlipidemia  . Breast cancer Sister     x 3  . Heart disease Brother     x5; one with MI  . Heart disease Sister     x3  . Diabetes Sister     x3  . Lung cancer Sister   . Breast cancer Sister     x2  . Colon cancer Neg Hx      Review of Systems: no LEE, orthopnea, PND. All other systems reviewed and are otherwise negative except as noted above.  Labs:  Recent Labs  08/08/16 2231 08/09/16 0455  TROPONINI 0.07* 0.07*   Lab Results  Component Value Date   WBC 8.0 08/09/2016   HGB 11.5 (L) 08/09/2016   HCT 33.3 (L) 08/09/2016   MCV 90.0 08/09/2016   PLT 211 08/09/2016    Recent Labs Lab 08/09/16 0455  NA 141  K 4.3  CL 112*  CO2 22  BUN 23*  CREATININE 1.22*  CALCIUM 9.1  GLUCOSE 119*   Lab Results  Component Value Date   CHOL 330 (H) 08/08/2016   HDL 42 08/08/2016   LDLCALC 225 (H) 08/08/2016   TRIG 313 (H) 08/08/2016   Lab Results  Component Value Date   DDIMER 2.39 (H) 05/23/2016    Radiology/Studies:  Dg Chest 2 View  Result Date: 08/08/2016 CLINICAL DATA:  Shortness of breath. EXAM: CHEST  2 VIEW COMPARISON:  Two-view chest x-ray 05/23/2016. FINDINGS: The heart is enlarged. There is no edema or effusion to suggest failure. Atherosclerotic changes are present at the aortic arch. No focal airspace disease is present. The visualized soft tissues and bony thorax are unremarkable. IMPRESSION: Stable cardiomegaly without failure. Electronically Signed   By: San Morelle M.D.   On: 08/08/2016 16:41    Wt Readings from Last 3 Encounters:  08/08/16 131 lb 11.2 oz (59.7 kg)  08/02/16 134 lb 6.4 oz (61 kg)  07/01/16 135 lb 2 oz (61.3 kg)    EKG: reviewed with Dr. Angelena Form - felt at this time to be more  likely atrial flutte rwith RVR, right axis deviation, original EKG with baseline wander, f/u EKG with nonspecific ST-T changes  Physical Exam: Blood pressure 130/82, pulse (!) 134, temperature 98.4 F (36.9 C), temperature source Oral, resp. rate 20, height 5' (1.524 m), weight 131 lb 11.2 oz (59.7 kg), SpO2 98 %. Body mass index is 25.72 kg/m. General: Well developed, well nourished elderly WF, in no acute distress. Lying 15 degrees in bed without orthopnea Head: Normocephalic, atraumatic, sclera non-icteric, no xanthomas, nares are without discharge.  Neck: Negative for carotid bruits. JVD not elevated. Lungs: Clear bilaterally to auscultation without wheezes, rales, or rhonchi. Breathing is unlabored. Heart: Tachycardic, irregular with brief periods of regularity, with S1 S2. No murmurs, rubs, or gallops appreciated. Abdomen: Soft, non-tender, non-distended with normoactive bowel sounds. No hepatomegaly. No rebound/guarding. No obvious abdominal masses. Msk:  Strength and tone appear normal for age. Extremities: No clubbing or cyanosis. No edema.  Distal pedal pulses are 2+ and equal bilaterally. Neuro: Alert and oriented X 3. No facial asymmetry. No focal deficit. Moves all extremities spontaneously. Mild memory impairment noted, occasionally has to have help from family to fill in gaps of information. Psych:  Responds to questions appropriately with a normal  affect.     Assessment and Plan  74F with CAD (s/p CABG in 2013), chronic diastolic CHF, multiple medication intolerances, baseline bradycardia (not on BB due to this), hypertensive heart disease, DM c/b neuropathy, HTN, hyperlipidemia, CKD stage III, depression, hypothyroidism, esophageal stricture, GERD, very remote stomach ulcers, carotid artery disease (40-59% bilaterally 10/2015), mild cognitive impairment (followed by neurology) who presented to [facility] with weakness, shakiness, tachycardia felt at this time to represent atrial flutter  with RVR.  1. Tachycardia with suspected new onset atrial flutter - rate unchanged with diltiazem. She has known history of bradycardia when in sinus (unable to tolerate BB or even clonidine in the past due to low HR) so there is likely a component of tachy-brady syndrome here. Would not feel comfortable cardioverting without TEE as she does report feeling similar to this in the past. Would continue IV diltiazem for now. Will ask EP to evaluate and review for treatment options. Note prior reported hx post-op PAF after CABG. CHADSVASC presently 6. IM notes raise concern for her ability to afford NOAC so will ask care management to review cost. She remains on heparin per pharmacy for now.  2. Hypertensive urgency - currently improved. Historically difficult to manage in context of multiple intolerances (including inability to use AVN blockers due to baseline slow HR).  3. CAD s/p prior CABG - stress testing in 05/2016 showed some EKG changes but perfusion was overall normal. Troponin elevation is likely due to demand ischemia without evidence for an acute coronary syndrome. If dyspnea persists beyond BP control and rhythm control, may need to consider definitive relook cath.  4. Chronic diastolic CHF - appears euvolemic. Follow.  5. Hyperlipidemia - h/o statin intolerance. Consider follow-up in the lipid clininc to review PCSK9 if not already visited.  Signed, Charlie Pitter PA-C 08/09/2016, 12:19 PM Pager: [phone]  I have personally seen and examined this patient with Melina Copa, PA-C. I agree with the assessment and plan as outlined above. She has known CAD s/p CABG with no recent anginal type symptoms. Recent normal nuclear stress test. Echo August 2017 with normal LV systolic function. She has had issues with elevated BP but also fatigue and weakness with sinus bradycardia. Clonidine stopped last week due to fatigue and bradycardia. Now admitted with palpitations, worsened weakness and fatigue. She  is found to be tachycardic. Her EKG shows a regular narrow complex tachycardia which appears to be an atrial tachycardia or atrial flutter. Her BP is stable. Exam with Well developed female in NAD. [id number], regular, no loud murmurs. Lungs: clear bilaterally. Ext: no edema. Labs reviewed by me. EKG reviewed by me (see above).  She has tachy/brady syndrome. She is currently on IV Cardizem and IV heparin. Her HR is still in the 130s. I think we could consider a TEE guided DCCV but I worry that her baseline sinus rhythm will be slow on medical therapy. She may ultimately require a permanent pacemaker. I have reviewed this with her and her family today. For now, will continue Diltiazem for rate control and IV heparin. I will ask the EP team to see her today to review options for DCCV vs ablation and also to discuss potential need for placement of a pacemaker.   [name] 08/09/2016 1:12 PM

## 2016-08-09 NOTE — Progress Notes (Signed)
ANTICOAGULATION CONSULT NOTE - Follow Up Consult  Pharmacy Consult for Heparin Indication: atrial fibrillation  Allergies  Allergen Reactions  . Clonidine Derivatives Other (See Comments)    Bradycardia and fatigue   . Crestor [Rosuvastatin Calcium] Other (See Comments)    Other reaction(s): tired and weak  . Epinephrine Other (See Comments)    Abnormal feeling. Dental exam/injection of local w/ epi.  Marland Kitchen Hydralazine Other (See Comments)    Nausea/gi upset   . Losartan Other (See Comments)    Hyperkalemia   . Other Other (See Comments)    MANGO'S - WHELPS ALL OVER  . Sulfa Antibiotics Other (See Comments)    Unknown    Patient Measurements: Height: 5' (152.4 cm) Weight: 131 lb 11.2 oz (59.7 kg) IBW/kg (Calculated) : 45.5  Vital Signs: Temp: 98.8 F (37.1 C) (10/16 1537) Temp Source: Oral (10/16 1537) BP: 126/96 (10/16 1700) Pulse Rate: 106 (10/16 1700)  Labs:  Recent Labs  08/08/16 1426 08/08/16 2231 08/09/16 0455 08/09/16 1314 08/09/16 1648  HGB 12.6  --  11.5*  --   --   HCT 36.8  --  33.3*  --   --   PLT 193  --  211  --   --   HEPARINUNFRC  --   --  0.62  --  0.56  CREATININE 1.39*  --  1.22*  --   --   TROPONINI  --  0.07* 0.07* 0.05*  --     Estimated Creatinine Clearance: 31.7 mL/min (by C-G formula based on SCr of 1.22 mg/dL (H)).   Medications:  Heparin @ 800 units/hr  Assessment: 75yof continues on heparin for afib with plans for TEE/DCCV. Confirmatory heparin level is therapeutic at 0.56. No bleeding.  Goal of Therapy:  Heparin level 0.3-0.7 units/ml Monitor platelets by anticoagulation protocol: Yes   Plan:  1) Continue heparin at 800 units/hr for now 2) Daily heparin level and CBC  Deboraha Sprang 08/09/2016,6:06 PM

## 2016-08-09 NOTE — Consult Note (Signed)
ELECTROPHYSIOLOGY CONSULT NOTE    Patient ID: [name] MRN: [id number], DOB/AGE: 12/18/1940 75 y.o.  Admit date: 08/08/2016 Date of Consult: 08/09/2016  Primary Physician: Ileana Roup, MD Primary Cardiologist: Debara Pickett Requesting MD: Angelena Form  Reason for Consultation: atrial arrhythmia management  HPI:  [name] is a 75 y.o. female with a past medical history significant for CAD s/p CABG, chronic diastolic heart failure, sinus bradycardia (intolerant of BB), hypertensive heart disease, diabetes, hypertension, hyperlipidemia, and CKD.  She reports a month long history of exercise intolerance and fatigue without a sensation of heart racing.  She was placed on Diltiazem drip and IV Heparin.  Her rates have remained in the 130's without significant change. EP has been asked to evaluate for treatment options with prior symptomatic bradycardia.  Echo 05/2016 demonstrated EF 19-37%, grade 1 diastolic dysfunction, LA 35.   She currently states that her feeling of "jitteriness" is better. She denies chest pain, shortness of breath, LE edema, recent fevers or chills. She has not had dizziness, pre-syncope or syncope.   Past Medical History:  Diagnosis Date  . Arthritis    "in my hands"  . Carotid artery disease ([place])    a. 40-59% bilaterally 10/2015.  Marland Kitchen Chronic diastolic CHF (congestive heart failure) ([place])    a. 05/2016 Echo: EF 60-65%, no rwma, Gr1 DD, Ao sclerosis w/o stenosis, triv MR.  . CKD (chronic kidney disease), stage III   . Coronary artery disease    a. 02/2007 Persantine MV: low risk;  b. 11/2011 CABG x 3 (LIMA->LAD, VG->OM, VG->RCA);  c. 05/2016 MV: EF >65%, no isch/infarct, horiz ST dep in I, II, V5-V6.  Marland Kitchen Depression   . Diabetes mellitus   . Diverticulosis   . Esophageal stricture   . GERD (gastroesophageal reflux disease)   . Hemorrhoids   . Hiatal hernia   . Hyperkalemia    a. ARB stopped due to this.  . Hyperlipidemia   . Hypertensive heart disease     . Hypothyroidism   . Mild cognitive impairment    a. seen by neurology.  Marland Kitchen PAF (paroxysmal atrial fibrillation) (HCC)    a. post-op CABG.  . Pain    RIGHT KNEE PAIN - TORN RIGHT MEDIAL MENISCUS  . PONV (postoperative nausea and vomiting)   . S/P CABG (coronary artery bypass graft), 12/04/11 12/07/2011   LIMA to LAD, SVG to OM, SVG to RCA  . Sinus bradycardia    a. not on BB due to this.     Surgical History:  Past Surgical History:  Procedure Laterality Date  . ABDOMINAL HYSTERECTOMY  1980's  . BACK SURGERY  2006   "cyst growing near my spine"  . CARDIAC CATHETERIZATION  12/02/2011   mild LV dysfunction with mod hypocontractility of mid-distal anterolateral wall; CAD w/ostial tapering of L [place] with 50% diffuse ostial narrowing of LAD, 99% eccentric focal prox LAD stenosis followed by 70% prox LAD stenosis after 1st diag, 20% mid LAD narrowing; 80% ostial-to-prox L Cfx stenosis & 40-50% irregularity of RCA (Dr. Corky Downs)  . CATARACT EXTRACTION W/ INTRAOCULAR LENS  IMPLANT, BILATERAL  ~ 2010  . [place]  . CORONARY ARTERY BYPASS GRAFT  12/04/2011   Procedure: CORONARY ARTERY BYPASS GRAFTING (CABG);  Surgeon: Tharon Aquas Adelene Idler, MD;  Location: [facility];  Service: Open Heart Surgery;  Laterality: N/A;  CABG x three,  using left internal mammary artery, and right leg greater saphenous vein harvested endoscopically  . DILATION  AND CURETTAGE OF UTERUS     "a couple times"  . FRACTURE SURGERY     "put pins both side right ankle"  . JOINT REPLACEMENT    . KNEE ARTHROSCOPY WITH MEDIAL MENISECTOMY Right 07/02/2014   Procedure: RIGHT KNEE ARTHROSCOPY WITH PARTIAL MEDIAL MENISTECTOMY, ABRASION CONDROPLASTYU OF PATELLA,ABRASION CONDROPLASTY OF MEDIAL FEMEROL CONDYL, MICROFRACTURE OF MEDIAL FEMEROL CONDYL;  Surgeon: Tobi Bastos, MD;  Location: [facility];  Service: Orthopedics;  Laterality: Right;  . LEFT HEART CATHETERIZATION WITH CORONARY ANGIOGRAM N/A 12/02/2011   Procedure: LEFT HEART  CATHETERIZATION WITH CORONARY ANGIOGRAM;  Surgeon: Troy Sine, MD;  Location[facility] CATH LAB;  Service: Cardiovascular;  Laterality: N/A;  Coronary angiogram, possible PCI  . TONSILLECTOMY  1949  . TOTAL KNEE ARTHROPLASTY  ~ 2006   left  . TRANSTHORACIC ECHOCARDIOGRAM  02/19/2013   EF 32-99%, grade 1 diastolic dysfunction; mildly thickend/calcified AV leaflets; mildly calcidied MV annulus; mild TR     Prescriptions Prior to Admission  Medication Sig Dispense Refill Last Dose  . amLODipine (NORVASC) 10 MG tablet TAKE ONE TABLET BY MOUTH ONCE DAILY IN THE MORNING (Patient taking differently: Take 10 mg by mouth once daily) 30 tablet 2 08/07/2016 at Unknown  . Cholecalciferol (VITAMIN D3 PO) Take 1 capsule by mouth daily.   08/07/2016 at Unknown time  . Cyanocobalamin (VITAMIN B-12 PO) Take 1 tablet by mouth daily.   08/07/2016 at Unknown time  . doxazosin (CARDURA) 1 MG tablet Take 1 tablet (1 mg total) by mouth daily. 30 tablet 12 08/08/2016 at Unknown time  . esomeprazole (NEXIUM) 20 MG capsule Take 1 capsule (20 mg total) by mouth daily at 12 noon. 30 capsule 3 08/08/2016 at Unknown time  . furosemide (LASIX) 20 MG tablet Take 20 mg by mouth daily.    08/07/2016 at Unknown time  . gabapentin (NEURONTIN) 300 MG capsule Take 300-600 mg by mouth See admin instructions. Take 300 mg by mouth in the morning and take 600 mg by mouth at bedtime   08/08/2016 at Unknown time  . insulin glargine (LANTUS) 100 UNIT/ML injection Inject 14 Units into the skin every morning.    08/08/2016 at Unknown time  . levothyroxine (SYNTHROID, LEVOTHROID) 50 MCG tablet Take 50 mcg by mouth daily before breakfast.    08/07/2016 at Unknown time  . Multiple Vitamins-Minerals (PRESERVISION AREDS 2 PO) Take 1 tablet by mouth daily.   08/07/2016 at Unknown time  . Polyethyl Glycol-Propyl Glycol (SYSTANE OP) Place 1 drop into both eyes 2 (two) times daily.    08/08/2016 at Unknown time  . sitaGLIPtin-metformin (JANUMET) 50-1000  MG per tablet Take 1 tablet by mouth 2 (two) times daily with a meal.   08/08/2016 at Unknown time  . vitamin C (ASCORBIC ACID) 500 MG tablet Take 500 mg by mouth daily.   08/07/2016 at Unknown time  . VITAMIN E PO Take 1 capsule by mouth daily.   08/07/2016 at Unknown time  . meclizine (ANTIVERT) 25 MG tablet Take 25 mg by mouth daily as needed for dizziness. For dizziness   Unknown    Inpatient Medications:  . aspirin  81 mg Oral Daily  . docusate sodium  100 mg Oral BID  . doxazosin  1 mg Oral Daily  . gabapentin  300 mg Oral Daily  . gabapentin  600 mg Oral QHS  . hydroxypropyl methylcellulose / hypromellose   Both Eyes BID  . insulin aspart  0-15 Units Subcutaneous TID WC  . insulin glargine  14 Units Subcutaneous Daily  . levothyroxine  50 mcg Oral QAC breakfast  . multivitamin  1 tablet Oral Daily  . pantoprazole  40 mg Oral Daily  . sodium chloride flush  3 mL Intravenous Q12H    Allergies:  Allergies  Allergen Reactions  . Clonidine Derivatives Other (See Comments)    Bradycardia and fatigue   . Crestor [Rosuvastatin Calcium] Other (See Comments)    Other reaction(s): tired and weak  . Epinephrine Other (See Comments)    Abnormal feeling. Dental exam/injection of local w/ epi.  Marland Kitchen Hydralazine Other (See Comments)    Nausea/gi upset   . Losartan Other (See Comments)    Hyperkalemia   . Other Other (See Comments)    MANGO'S - WHELPS ALL OVER  . Sulfa Antibiotics Other (See Comments)    Unknown    Social History   Social History  . Marital status: Widowed    Spouse name: N/A  . Number of children: 2  . Years of education: 12   Occupational History  . retired    Social History Main Topics  . Smoking status: Never Smoker  . Smokeless tobacco: Never Used  . Alcohol use 0.0 oz/week     Comment: very occasional   . Drug use: No  . Sexual activity: No   Other Topics Concern  . Not on file   Social History Narrative  . No narrative on file     Family  History  Problem Relation Age of Onset  . Diabetes Mother     also, HTN & CVA  . Heart disease Father     also hyperlipidemia  . Breast cancer Sister     x 3  . Heart disease Brother     x5; one with MI  . Heart disease Sister     x3  . Diabetes Sister     x3  . Lung cancer Sister   . Breast cancer Sister     x2  . Colon cancer Neg Hx      Review of Systems: All other systems reviewed and are otherwise negative except as noted above.  Physical Exam: Vitals:   08/09/16 0900 08/09/16 1000 08/09/16 1150 08/09/16 1200  BP: 129/86 128/89 (!) 146/82 130/82  Pulse: (!) 134 (!) 133 (!) 135 (!) 134  Resp: 18 (!) 24 16 20   Temp:   98.4 F (36.9 C)   TempSrc:   Oral   SpO2: 98% 98% 98% 98%  Weight:      Height:        GEN- The patient is elderly appearing, alert and oriented x 3 today.   HEENT: normocephalic, atraumatic; sclera clear, conjunctiva pink; hearing intact; oropharynx clear; neck supple  Lungs- Clear to ausculation bilaterally, normal work of breathing.  No wheezes, rales, rhonchi Heart- Tachycardic regular rate and rhythm  GI- soft, non-tender, non-distended, bowel sounds present  Extremities- no clubbing, cyanosis, or edema MS- no significant deformity or atrophy Skin- warm and dry, no rash or lesion Psych- euthymic mood, full affect Neuro- strength and sensation are intact  Labs:   Lab Results  Component Value Date   WBC 8.0 08/09/2016   HGB 11.5 (L) 08/09/2016   HCT 33.3 (L) 08/09/2016   MCV 90.0 08/09/2016   PLT 211 08/09/2016    Recent Labs Lab 08/09/16 0455  NA 141  K 4.3  CL 112*  CO2 22  BUN 23*  CREATININE 1.22*  CALCIUM 9.1  GLUCOSE 119*  Radiology/Studies: Dg Chest 2 View Result Date: 08/08/2016 CLINICAL DATA:  Shortness of breath. EXAM: CHEST  2 VIEW COMPARISON:  Two-view chest x-ray 05/23/2016. FINDINGS: The heart is enlarged. There is no edema or effusion to suggest failure. Atherosclerotic changes are present at the aortic  arch. No focal airspace disease is present. The visualized soft tissues and bony thorax are unremarkable. IMPRESSION: Stable cardiomegaly without failure. Electronically Signed   By: San Morelle M.D.   On: 08/08/2016 16:41    EKG: likely atypical atrial flutter, ventricular rate 128  TELEMETRY: atrial flutter, ventricular rate 130's  Assessment/Plan: 1.  Atypical atrial flutter The patient presented with symptomatic atypical atrial flutter. Rates remain in the 130's despite Diltiazem.  I think at this point, TEE/DCCV is most reasonable next step. If she has persistent bradycardia in SR or recurrent atrial arrhythmias, then she will likely require PPM for tachy/brady syndrome.   Her AAD options are limited with CAD and CKD.  CHADS2VASC is 6. Continue Heparin for now, ideally would transition to Xarelto later today prior to TEE/DCCV tomorrow. Will discuss with Dr Lovena Le.   2.  Sinus bradycardia Symptomatic in the past and required tapering off rate slowing medications She may ultimately require PPM implant anyway, but will see how she does after TEE/DCCV tomorrow  3.  HTN Stable No change required today  4.  CAD s/p CABG No recent ischemic symptoms Continue medical therapy  She will need to have Heparin transitioned to [place]. There are some concerns about cost with DOAC. I think for the short term, could use Xarelto with copay card to allow for cardioversion. Will discuss with Dr Lovena Le.    Signed, Chanetta Marshall, NP 08/09/2016 12:59 PM  EP Attending  Patient seen and examined. Agree with above. She is stable but remains with atypical flutter. Exam reveals a regular tachy and lungs are clear and neuro is normal and no edema and tele reveals atypical flutter with 2:1 AV conduction. Will plan to switch her from IV heparin to Xarelto and proceed with TEE guided DCCV tomorrow. Her HTN is stable and she denies angina despite her rapid HR's. Finally, she may ultimately  require PPM after DCCV if sinus node dysfunction persists.  Gregg Taylor,M.D.  Mikle Bosworth.D.

## 2016-08-09 NOTE — Progress Notes (Signed)
PROGRESS NOTE    [name]  OBS:[id number] DOB: [birth year] DOA: 08/08/2016 PCP: Ileana Roup, MD    Brief Narrative: [name] is a 75 y.o. female with medical history significant of CAD (s/p CABG in 2013), DM, HTN, diastolic CHF, CKD, and hypothyroidism presenting with issues with BP since last hospitalization.  Very sensitive to medications, lots of side effects.  The last medication, Clonidine, either caused bradycardia or BP was too high.  Changed to doxazosin with Norvasc while coming off Clonidine, but in the last few days BP increasing and pulse increasing.  Feeling very shaky, weak.  Also feels weak from other medications.  Denies chest pain.  Occasional SOB.  Headaches around R eye and temporal region for the last few weeks, comes and goes.  Occasional blurry vision, worsening - unsure how long this has been going on.  Chronic neuropathy in feet and legs.  Dizziness, takes Meclizine.  A bit unsteady on her feet recently.  CAGB 2013.  Last stress test was Jun 03, 2016.  Normal.  Last cath was 2013.   Assessment & Plan:   Principal Problem:   Atrial fibrillation (HCC) Active Problems:   Diabetes mellitus, type 2 (HCC)   Hyperlipidemia LDL goal <70   Coronary artery disease   CKD (chronic kidney disease), stage III   Hypertensive urgency  Atrial fibrillation Patient presenting with new-onset afib (she did have apparent afib following her CABG but converted to NSR and has not had recurrence). Echocardiogram pending TSH 2.3.  FLP , LDL 225.  and Hg-A1c On IV Cardizem, rate not controlled on high dose. Cardiology consulted. ' On IV heparin/   Hypertensive urgency vs. Poorly controlled HTN -Continue with alpha-blocker (new) -She had difficulty with ARB therapy due to hyperkalemia/[name],   -She had bradycardia from Clonidine and so beta blocker therapy may have similar issues. -BP stable on Cardizem.   DM -Hold Janumet but continue Lantus -Cover with  SSI  CAD -h/o CABG in 2013. -started on aspirin.   HLD -No apparent recent lipid panel -Not apparently taking a statin., adverse effect from crestor.  -LDL goal should be <70 -could consider tricor, will follow cardio rec.   CKD -Current creatinine of 1.39 appears to be better than usual baseline of 1.5 -stable.      DVT prophylaxis: Heparin.  Code Status: Full code.  Family Communication: Daughter at bedside.  Disposition Plan: remain in the step down units.   Consultants:   Cardiology   Procedures:  ECHO.   Antimicrobials: none  Subjective: Feeling well, eating breakfast. Denies chest pain or dyspnea.   Objective: Vitals:   08/08/16 2132 08/09/16 0000 08/09/16 0300 08/09/16 0800  BP: (!) 151/77 (!) 155/82  (!) 148/79  Pulse: (!) 120 (!) 127  (!) 135  Resp: 20 16  (!) 21  Temp: 98.6 F (37 C) 98.4 F (36.9 C) 98.5 F (36.9 C) 97.7 F (36.5 C)  TempSrc: Oral Oral Oral Oral  SpO2: 98% 97%  98%  Weight: 59.7 kg (131 lb 11.2 oz)     Height: 5' (1.524 m)       Intake/Output Summary (Last 24 hours) at 08/09/16 0833 Last data filed at 08/09/16 0600  Gross per 24 hour  Intake           277.38 ml  Output              600 ml  Net          -322.[place]  ml   Filed Weights   08/08/16 1423 08/08/16 2132  Weight: 60.8 kg (134 lb) 59.7 kg (131 lb 11.2 oz)    Examination:  General exam: Appears calm and comfortable  Respiratory system: Clear to auscultation. Respiratory effort normal. Cardiovascular system: S1 & S2 heard, RRR. No JVD, murmurs, rubs, gallops or clicks. No pedal edema. Gastrointestinal system: Abdomen is nondistended, soft and nontender. No organomegaly or masses felt. Normal bowel sounds heard. Central nervous system: Alert and oriented. No focal neurological deficits. Extremities: Symmetric 5 x 5 power. Skin: No rashes, lesions or ulcers Psychiatry: Judgement and insight appear normal. Mood & affect appropriate.     Data Reviewed: I have  personally reviewed following labs and imaging studies  CBC:  Recent Labs Lab 08/08/16 1426 08/09/16 0455  WBC 8.0 8.0  HGB 12.6 11.5*  HCT 36.8 33.3*  MCV 90.6 90.0  PLT 193 188   Basic Metabolic Panel:  Recent Labs Lab 08/08/16 1426 08/09/16 0455  NA 140 141  K 5.1 4.3  CL 108 112*  CO2 20* 22  GLUCOSE 195* 119*  BUN 25* 23*  CREATININE 1.39* 1.22*  CALCIUM 9.4 9.1   GFR: Estimated Creatinine Clearance: 31.7 mL/min (by C-G formula based on SCr of 1.22 mg/dL (H)). Liver Function Tests: No results for input(s): AST, ALT, ALKPHOS, BILITOT, PROT, ALBUMIN in the last 168 hours. No results for input(s): LIPASE, AMYLASE in the last 168 hours. No results for input(s): AMMONIA in the last 168 hours. Coagulation Profile: No results for input(s): INR, PROTIME in the last 168 hours. Cardiac Enzymes:  Recent Labs Lab 08/08/16 2231 08/09/16 0455  TROPONINI 0.07* 0.07*   BNP (last 3 results) No results for input(s): PROBNP in the last 8760 hours. HbA1C: No results for input(s): HGBA1C in the last 72 hours. CBG:  Recent Labs Lab 08/08/16 1434 08/09/16 0046  GLUCAP 166* 125*   Lipid Profile:  Recent Labs  08/08/16 2130  CHOL 330*  HDL 42  LDLCALC 225*  TRIG 313*  CHOLHDL 7.9   Thyroid Function Tests:  Recent Labs  08/08/16 1606  TSH 2.306   Anemia Panel: No results for input(s): VITAMINB12, FOLATE, FERRITIN, TIBC, IRON, RETICCTPCT in the last 72 hours. Sepsis Labs: No results for input(s): PROCALCITON, LATICACIDVEN in the last 168 hours.  Recent Results (from the past 240 hour(s))  MRSA PCR Screening     Status: None   Collection Time: 08/08/16  9:08 PM  Result Value Ref Range Status   MRSA by PCR NEGATIVE NEGATIVE Final    Comment:        The GeneXpert MRSA Assay (FDA approved for NASAL specimens only), is one component of a comprehensive MRSA colonization surveillance program. It is not intended to diagnose MRSA infection nor to guide  or monitor treatment for MRSA infections.          Radiology Studies: Dg Chest 2 View  Result Date: 08/08/2016 CLINICAL DATA:  Shortness of breath. EXAM: CHEST  2 VIEW COMPARISON:  Two-view chest x-ray 05/23/2016. FINDINGS: The heart is enlarged. There is no edema or effusion to suggest failure. Atherosclerotic changes are present at the aortic arch. No focal airspace disease is present. The visualized soft tissues and bony thorax are unremarkable. IMPRESSION: Stable cardiomegaly without failure. Electronically Signed   By: San Morelle M.D.   On: 08/08/2016 16:41        Scheduled Meds: . aspirin  81 mg Oral Daily  . docusate sodium  100 mg Oral  BID  . doxazosin  1 mg Oral Daily  . gabapentin  300 mg Oral Daily  . gabapentin  600 mg Oral QHS  . hydroxypropyl methylcellulose / hypromellose   Both Eyes BID  . insulin aspart  0-15 Units Subcutaneous TID WC  . insulin glargine  14 Units Subcutaneous Daily  . levothyroxine  50 mcg Oral QAC breakfast  . multivitamin  1 tablet Oral Daily  . pantoprazole  40 mg Oral Daily  . sodium chloride flush  3 mL Intravenous Q12H   Continuous Infusions: . diltiazem (CARDIZEM) infusion 10 mg/hr (08/09/16 0529)  . heparin 800 Units/hr (08/09/16 0600)     LOS: 1 day    Time spent: 35 minutes.     Elmarie Shiley, MD [facility] Pager [phone]  If 7PM-7AM, please contact night-coverage [email or web address] Password [email or web address] 08/09/2016, 8:33 AM

## 2016-08-09 NOTE — Progress Notes (Signed)
  Echocardiogram 2D Echocardiogram has been attempted, HR in the 130's. Will attempt after HR has been addressed.  Aggie Cosier 08/09/2016, 9:53 AM

## 2016-08-09 NOTE — Care Management Note (Addendum)
Case Management Note  Patient Details  Name: [name] MRN: [id number] Date of Birth: [birth year]  Subjective/Objective:     Adm w at fib               Action/Plan: Lives alone at home, has da  Expected Discharge Date:  08/16/16               Expected Discharge Plan:  Home/Self Care  In-House Referral:     Discharge planning Services     Post Acute Care Choice:    Choice offered to:     DME Arranged:    DME Agency:     HH Arranged:    HH Agency:     Status of Service:  In process, will continue to follow  If discussed at Long Length of Stay Meetings, dates discussed:    Additional Comments: will moniter for dc needs as pt progresses, no dc needs anticipated.Co-pay amount for Xeralto 20 mg. $157.12 PA required @ [phone].  Co-pay amount Eliquis 5mg . PO bid: $ 157.27 PA required  Gave pt 30day free cards for both eliquis and xarelto. [phone].   Lacretia Leigh, RN 08/09/2016, 10:27 AM

## 2016-08-09 NOTE — Progress Notes (Signed)
[place] for Heparin Indication: atrial fibrillation  Allergies  Allergen Reactions  . Clonidine Derivatives Other (See Comments)    Bradycardia and fatigue   . Crestor [Rosuvastatin Calcium] Other (See Comments)    Other reaction(s): tired and weak  . Epinephrine Other (See Comments)    Abnormal feeling. Dental exam/injection of local w/ epi.  Marland Kitchen Hydralazine Other (See Comments)    Nausea/gi upset   . Losartan Other (See Comments)    Hyperkalemia   . Other Other (See Comments)    MANGO'S - WHELPS ALL OVER  . Sulfa Antibiotics Other (See Comments)    Unknown    Patient Measurements: Height: 5' (152.4 cm) Weight: 131 lb 11.2 oz (59.7 kg) IBW/kg (Calculated) : 45.5 Heparin Dosing Weight: 58 kg  Vital Signs: Temp: 98.5 F (36.9 C) (10/16 0300) Temp Source: Oral (10/16 0300) BP: 155/82 (10/16 0000) Pulse Rate: 127 (10/16 0000)  Labs:  Recent Labs  08/08/16 1426 08/08/16 2231 08/09/16 0455  HGB 12.6  --   --   HCT 36.8  --   --   PLT 193  --   --   HEPARINUNFRC  --   --  0.62  CREATININE 1.39*  --   --   TROPONINI  --  0.07*  --     Estimated Creatinine Clearance: 27.8 mL/min (by C-G formula based on SCr of 1.39 mg/dL (H)).  Assessment: 75 y.o. female with Afib for heparin  Goal of Therapy:  Heparin level 0.3-0.7 units/ml Monitor platelets by anticoagulation protocol: Yes   Plan:  Continue Heparin at current rate  F/U plan for oral anticoagulation  Phillis Knack, PharmD, BCPS  08/09/2016, 5:41 AM

## 2016-08-10 ENCOUNTER — Other Ambulatory Visit (HOSPITAL_COMMUNITY): Payer: Medicare Other

## 2016-08-10 ENCOUNTER — Ambulatory Visit: Payer: Medicare Other

## 2016-08-10 ENCOUNTER — Ambulatory Visit (HOSPITAL_COMMUNITY): Admit: 2016-08-10 | Payer: Medicare Other | Admitting: Cardiology

## 2016-08-10 LAB — GLUCOSE, CAPILLARY
Glucose-Capillary: 131 mg/dL — ABNORMAL HIGH (ref 65–99)
Glucose-Capillary: 211 mg/dL — ABNORMAL HIGH (ref 65–99)
Glucose-Capillary: 114 mg/dL — ABNORMAL HIGH (ref 65–99)
Glucose-Capillary: 204 mg/dL — ABNORMAL HIGH (ref 65–99)

## 2016-08-10 LAB — BASIC METABOLIC PANEL
Anion gap: 5 (ref 5–15)
BUN: 21 mg/dL — ABNORMAL HIGH (ref 6–20)
CO2: 23 mmol/L (ref 22–32)
Calcium: 8.7 mg/dL — ABNORMAL LOW (ref 8.9–10.3)
Chloride: 112 mmol/L — ABNORMAL HIGH (ref 101–111)
Creatinine, Ser: 1.55 mg/dL — ABNORMAL HIGH (ref 0.44–1.00)
GFR calc Af Amer: 36 mL/min — ABNORMAL LOW (ref >60)
GFR calc non Af Amer: 31 mL/min — ABNORMAL LOW (ref >60)
Glucose, Bld: 157 mg/dL — ABNORMAL HIGH (ref 65–99)
Potassium: 4.8 mmol/L (ref 3.5–5.1)
Sodium: 140 mmol/L (ref 135–145)

## 2016-08-10 LAB — HEMOGLOBIN A1C
Hgb A1c MFr Bld: 6.4 % — ABNORMAL HIGH (ref 4.8–5.6)
Mean Plasma Glucose: 137 mg/dL

## 2016-08-10 MED ORDER — RIVAROXABAN 15 MG PO TABS
15.0000 mg | ORAL_TABLET | Freq: Every day | ORAL | Status: DC
  Administered 2016-08-10 – 2016-08-13 (×4): 15 mg via ORAL
  Filled 2016-08-10 (×5): qty 1

## 2016-08-10 MED ORDER — SODIUM CHLORIDE 0.9 % IV SOLN
INTRAVENOUS | Status: DC
  Administered 2016-08-10 – 2016-08-11 (×2): via INTRAVENOUS

## 2016-08-10 MED ORDER — ALUM & MAG HYDROXIDE-SIMETH 200-200-20 MG/5ML PO SUSP
15.0000 mL | ORAL | Status: DC | PRN
  Administered 2016-08-10: 30 mL via ORAL
  Filled 2016-08-10: qty 30

## 2016-08-10 NOTE — Progress Notes (Signed)
PROGRESS NOTE    [name]  SFK:[id number] DOB: [birth year] DOA: 08/08/2016 PCP: Ileana Roup, MD    Brief Narrative: [name] is a 75 y.o. female with medical history significant of CAD (s/p CABG in 2013), DM, HTN, diastolic CHF, CKD, and hypothyroidism presenting with issues with BP since last hospitalization.  Very sensitive to medications, lots of side effects.  The last medication, Clonidine, either caused bradycardia or BP was too high.  Changed to doxazosin with Norvasc while coming off Clonidine, but in the last few days BP increasing and pulse increasing.  Feeling very shaky, weak.  Also feels weak from other medications.  Denies chest pain.  Occasional SOB.  Headaches around R eye and temporal region for the last few weeks, comes and goes.  Occasional blurry vision, worsening - unsure how long this has been going on.  Chronic neuropathy in feet and legs.  Dizziness, takes Meclizine.  A bit unsteady on her feet recently.  CAGB 2013.  Last stress test was Jun 03, 2016.  Normal.  Last cath was 2013.   Assessment & Plan:   Principal Problem:   Atrial flutter with rapid ventricular response (HCC) Active Problems:   Diabetes mellitus, type 2 (HCC)   Hyperlipidemia LDL goal <70   S/P CABG (coronary artery bypass graft), 12/04/11   HTN (hypertension)   Coronary artery disease   CKD (chronic kidney disease), stage III   Hypertensive urgency   Chronic diastolic CHF (congestive heart failure) (HCC)   Elevated troponin  Atrial fibrillation Patient presenting with new-onset afib (she did have apparent afib following her CABG but converted to NSR and has not had recurrence). Echocardiogram pending TSH 2.3.  FLP , LDL 225.  and Hg-A1c 6.4 On IV Cardizem, rate not controlled on high dose. Cardiology consulted. '  Start xarelto, with plan for TEE/ DCCV on 10-18  Hypertensive urgency vs. Poorly controlled HTN -Continue with alpha-blocker (new) -She had difficulty with  ARB therapy due to hyperkalemia/[name],   -She had bradycardia from Clonidine and so beta blocker therapy may have similar issues. -BP stable on Cardizem.   DM -Hold Janumet but continue Lantus -Cover with SSI  CAD -h/o CABG in 2013. -started on aspirin.   HLD -No apparent recent lipid panel -Not apparently taking a statin., adverse effect from crestor.  -LDL goal should be <70 -could consider tricor, will follow cardio rec.   CKD -Current creatinine of 1.39 appears to be better than usual baseline of 1.5 -stable.      DVT prophylaxis: Heparin.  Code Status: Full code.  Family Communication: care discussed with patient.   Disposition Plan: remain in the step down units.   Consultants:   Cardiology   Procedures:  ECHO.   Antimicrobials: none  Subjective: Denies chest pain, or dyspnea.   Objective: Vitals:   08/10/16 0400 08/10/16 0500 08/10/16 0600 08/10/16 0746  BP: (!) 154/91   (!) 154/78  Pulse: 90 (!) 128 (!) 128 (!) 129  Resp: 19 17 17  (!) 25  Temp: 97.7 F (36.5 C)   97.7 F (36.5 C)  TempSrc: Oral   Oral  SpO2: 94% 95% 95% 99%  Weight: 59.4 kg (131 lb)     Height:        Intake/Output Summary (Last 24 hours) at 08/10/16 0813 Last data filed at 08/10/16 0600  Gross per 24 hour  Intake           652.57 ml  Output  750 ml  Net           -97.43 ml   Filed Weights   08/08/16 2132 08/09/16 2024 08/10/16 0400  Weight: 59.7 kg (131 lb 11.2 oz) 59.7 kg (131 lb 11.2 oz) 59.4 kg (131 lb)    Examination:  General exam: Appears calm and comfortable  Respiratory system: Clear to auscultation. Respiratory effort normal. Cardiovascular system: S1 & S2 heard, RRR. No JVD, murmurs, rubs, gallops or clicks. No pedal edema. Gastrointestinal system: Abdomen is nondistended, soft and nontender. No organomegaly or masses felt. Normal bowel sounds heard. Central nervous system: Alert and oriented. No focal neurological deficits. Extremities:  Symmetric 5 x 5 power. Skin: No rashes, lesions or ulcers Psychiatry: Judgement and insight appear normal. Mood & affect appropriate.     Data Reviewed: I have personally reviewed following labs and imaging studies  CBC:  Recent Labs Lab 08/08/16 1426 08/09/16 0455  WBC 8.0 8.0  HGB 12.6 11.5*  HCT 36.8 33.3*  MCV 90.6 90.0  PLT 193 638   Basic Metabolic Panel:  Recent Labs Lab 08/08/16 1426 08/09/16 0455 08/10/16 0259  NA 140 141 140  K 5.1 4.3 4.8  CL 108 112* 112*  CO2 20* 22 23  GLUCOSE 195* 119* 157*  BUN 25* 23* 21*  CREATININE 1.39* 1.22* 1.55*  CALCIUM 9.4 9.1 8.7*   GFR: Estimated Creatinine Clearance: 24.9 mL/min (by C-G formula based on SCr of 1.55 mg/dL (H)). Liver Function Tests: No results for input(s): AST, ALT, ALKPHOS, BILITOT, PROT, ALBUMIN in the last 168 hours. No results for input(s): LIPASE, AMYLASE in the last 168 hours. No results for input(s): AMMONIA in the last 168 hours. Coagulation Profile: No results for input(s): INR, PROTIME in the last 168 hours. Cardiac Enzymes:  Recent Labs Lab 08/08/16 2231 08/09/16 0455 08/09/16 1314  TROPONINI 0.07* 0.07* 0.05*   BNP (last 3 results) No results for input(s): PROBNP in the last 8760 hours. HbA1C:  Recent Labs  08/08/16 2231  HGBA1C 6.4*   CBG:  Recent Labs Lab 08/09/16 0828 08/09/16 1152 08/09/16 1539 08/09/16 2128 08/10/16 0744  GLUCAP 145* 218* 144* 219* 131*   Lipid Profile:  Recent Labs  08/08/16 2130  CHOL 330*  HDL 42  LDLCALC 225*  TRIG 313*  CHOLHDL 7.9   Thyroid Function Tests:  Recent Labs  08/08/16 1606  TSH 2.306   Anemia Panel: No results for input(s): VITAMINB12, FOLATE, FERRITIN, TIBC, IRON, RETICCTPCT in the last 72 hours. Sepsis Labs: No results for input(s): PROCALCITON, LATICACIDVEN in the last 168 hours.  Recent Results (from the past 240 hour(s))  MRSA PCR Screening     Status: None   Collection Time: 08/08/16  9:08 PM  Result  Value Ref Range Status   MRSA by PCR NEGATIVE NEGATIVE Final    Comment:        The GeneXpert MRSA Assay (FDA approved for NASAL specimens only), is one component of a comprehensive MRSA colonization surveillance program. It is not intended to diagnose MRSA infection nor to guide or monitor treatment for MRSA infections.          Radiology Studies: Dg Chest 2 View  Result Date: 08/08/2016 CLINICAL DATA:  Shortness of breath. EXAM: CHEST  2 VIEW COMPARISON:  Two-view chest x-ray 05/23/2016. FINDINGS: The heart is enlarged. There is no edema or effusion to suggest failure. Atherosclerotic changes are present at the aortic arch. No focal airspace disease is present. The visualized soft tissues and bony  thorax are unremarkable. IMPRESSION: Stable cardiomegaly without failure. Electronically Signed   By: San Morelle M.D.   On: 08/08/2016 16:41        Scheduled Meds: . aspirin  81 mg Oral Daily  . docusate sodium  100 mg Oral BID  . doxazosin  1 mg Oral Daily  . gabapentin  300 mg Oral Daily  . gabapentin  600 mg Oral QHS  . hydroxypropyl methylcellulose / hypromellose   Both Eyes BID  . insulin aspart  0-15 Units Subcutaneous TID WC  . insulin glargine  14 Units Subcutaneous Daily  . levothyroxine  50 mcg Oral QAC breakfast  . multivitamin  1 tablet Oral Daily  . pantoprazole  40 mg Oral Daily  . rivaroxaban  15 mg Oral QAC supper  . sodium chloride flush  3 mL Intravenous Q12H   Continuous Infusions: . diltiazem (CARDIZEM) infusion 15 mg/hr (08/10/16 0640)     LOS: 2 days    Time spent: 35 minutes.     Elmarie Shiley, MD [facility] Pager [phone]  If 7PM-7AM, please contact night-coverage [email or web address] Password [email or web address] 08/10/2016, 8:13 AM

## 2016-08-10 NOTE — Progress Notes (Signed)
Pt concerned about copay for xarelto with other copays.gave pt pt assist form for xarelto. Gave pt and da inform on ship program to see if can get help thru [facility] for meds. Also went on line and found pt assist programs for janumet-nexium-lantus-cardura-antivert.

## 2016-08-10 NOTE — Progress Notes (Signed)
SUBJECTIVE: The patient is doing well today.  At this time, she denies chest pain, shortness of breath, or any new concerns.  CURRENT MEDICATIONS: . aspirin  81 mg Oral Daily  . docusate sodium  100 mg Oral BID  . doxazosin  1 mg Oral Daily  . gabapentin  300 mg Oral Daily  . gabapentin  600 mg Oral QHS  . hydroxypropyl methylcellulose / hypromellose   Both Eyes BID  . insulin aspart  0-15 Units Subcutaneous TID WC  . insulin glargine  14 Units Subcutaneous Daily  . levothyroxine  50 mcg Oral QAC breakfast  . multivitamin  1 tablet Oral Daily  . pantoprazole  40 mg Oral Daily  . rivaroxaban  15 mg Oral QAC supper  . sodium chloride flush  3 mL Intravenous Q12H   . diltiazem (CARDIZEM) infusion 15 mg/hr (08/10/16 0640)    OBJECTIVE: Physical Exam: Vitals:   08/10/16 0400 08/10/16 0500 08/10/16 0600 08/10/16 0746  BP: (!) 154/91   (!) 154/78  Pulse: 90 (!) 128 (!) 128 (!) 129  Resp: 19 17 17  (!) 25  Temp: 97.7 F (36.5 C)   97.7 F (36.5 C)  TempSrc: Oral   Oral  SpO2: 94% 95% 95% 99%  Weight: 131 lb (59.4 kg)     Height:        Intake/Output Summary (Last 24 hours) at 08/10/16 0900 Last data filed at 08/10/16 0820  Gross per 24 hour  Intake           634.57 ml  Output              950 ml  Net          -315.43 ml    Telemetry reveals atrial flutter at 130  GEN- The patient is well appearing, alert and oriented x 3 today.   Head- normocephalic, atraumatic Eyes-  Sclera clear, conjunctiva pink Ears- hearing intact Oropharynx- clear Neck- supple  Lungs- Clear to ausculation bilaterally, normal work of breathing Heart- Tachycardic regular rate and rhythm  GI- soft, NT, ND, + BS Extremities- no clubbing, cyanosis, or edema Skin- no rash or lesion Psych- euthymic mood, full affect Neuro- strength and sensation are intact  LABS: Basic Metabolic Panel:  Recent Labs  08/09/16 0455 08/10/16 0259  NA 141 140  K 4.3 4.8  CL 112* 112*  CO2 22 23  GLUCOSE  119* 157*  BUN 23* 21*  CREATININE 1.22* 1.55*  CALCIUM 9.1 8.7*   CBC:  Recent Labs  08/08/16 1426 08/09/16 0455  WBC 8.0 8.0  HGB 12.6 11.5*  HCT 36.8 33.3*  MCV 90.6 90.0  PLT 193 211   Cardiac Enzymes:  Recent Labs  08/08/16 2231 08/09/16 0455 08/09/16 1314  TROPONINI 0.07* 0.07* 0.05*   Hemoglobin A1C:  Recent Labs  08/08/16 2231  HGBA1C 6.4*   Fasting Lipid Panel:  Recent Labs  08/08/16 2130  CHOL 330*  HDL 42  LDLCALC 225*  TRIG 313*  CHOLHDL 7.9   Thyroid Function Tests:  Recent Labs  08/08/16 1606  TSH 2.306    RADIOLOGY: Dg Chest 2 View Result Date: 08/08/2016 CLINICAL DATA:  Shortness of breath. EXAM: CHEST  2 VIEW COMPARISON:  Two-view chest x-ray 05/23/2016. FINDINGS: The heart is enlarged. There is no edema or effusion to suggest failure. Atherosclerotic changes are present at the aortic arch. No focal airspace disease is present. The visualized soft tissues and bony thorax are unremarkable. IMPRESSION: Stable cardiomegaly without  failure. Electronically Signed   By: San Morelle M.D.   On: 08/08/2016 16:41    ASSESSMENT AND PLAN:  Principal Problem:   Atrial flutter with rapid ventricular response (HCC) Active Problems:   Diabetes mellitus, type 2 (HCC)   Hyperlipidemia LDL goal <70   S/P CABG (coronary artery bypass graft), 12/04/11   HTN (hypertension)   Coronary artery disease   CKD (chronic kidney disease), stage III   Hypertensive urgency   Chronic diastolic CHF (congestive heart failure) (HCC)   Elevated troponin  1.  Atypical atrial flutter Plan had been for TEE/DCCV today.  Unfortunately, her Heparin was discontinued last night without Xarelto being given. Therefore, we Staisha Winiarski have to wait until tomorrow for TEE/DCCV CHADS2VASC is 6.  Continue Xarelto (15mg  daily with CrCl)  2.  Sinus bradycardia Symptomatic in the past May require pacing. Cyerra Yim evaluate sinus rates after cardioversion  3.  HTN  BP elevated this  morning Waino Mounsey follow for now  4.  CAD s/p CABG No recent ischemic symptoms Continue current therapy No BB with prior bradycardia  I have been unable to meet with her daughter. Nautika Cressey try to catch her later today. Dr Curt Bears to see later today.  Chanetta Marshall, NP 08/10/2016 9:05 AM  I have seen and examined this patient with Chanetta Marshall.  Agree with above, note added to reflect my findings.  On exam, tachycardic, no murmurs, lungs clear. Remains in atrial flutter.  Plan initially for TEE/CV today but did not get dose of Xarelto last night.  Received dose today and Antionio Negron thus plan for TEE/CV tomorrow.  May require pacemaker post TEE/CV as has had significant bradycardia in the past.    Tra Wilemon M. Keshayla Schrum MD 08/10/2016 10:04 AM

## 2016-08-11 ENCOUNTER — Encounter (HOSPITAL_COMMUNITY)
Admission: EM | Disposition: A | Discharge status: Home / Self Care | Payer: Self-pay | Source: Home / Self Care | Attending: Internal Medicine

## 2016-08-11 ENCOUNTER — Other Ambulatory Visit (HOSPITAL_COMMUNITY): Payer: Medicare Other

## 2016-08-11 ENCOUNTER — Inpatient Hospital Stay (HOSPITAL_COMMUNITY): Payer: Medicare Other | Admitting: Anesthesiology

## 2016-08-11 ENCOUNTER — Inpatient Hospital Stay (HOSPITAL_COMMUNITY): Payer: Medicare Other

## 2016-08-11 ENCOUNTER — Encounter (HOSPITAL_COMMUNITY): Payer: Self-pay | Admitting: *Deleted

## 2016-08-11 DIAGNOSIS — I340 Nonrheumatic mitral (valve) insufficiency: Secondary | ICD-10-CM

## 2016-08-11 DIAGNOSIS — I4891 Unspecified atrial fibrillation: Secondary | ICD-10-CM

## 2016-08-11 HISTORY — PX: CARDIOVERSION: SHX1299

## 2016-08-11 HISTORY — PX: TEE WITHOUT CARDIOVERSION: SHX5443

## 2016-08-11 LAB — GLUCOSE, CAPILLARY
Glucose-Capillary: 138 mg/dL — ABNORMAL HIGH (ref 65–99)
Glucose-Capillary: 158 mg/dL — ABNORMAL HIGH (ref 65–99)
Glucose-Capillary: 123 mg/dL — ABNORMAL HIGH (ref 65–99)

## 2016-08-11 LAB — BASIC METABOLIC PANEL
Anion gap: 10 (ref 5–15)
BUN: 22 mg/dL — ABNORMAL HIGH (ref 6–20)
CO2: 22 mmol/L (ref 22–32)
Calcium: 8.9 mg/dL (ref 8.9–10.3)
Chloride: 108 mmol/L (ref 101–111)
Creatinine, Ser: 1.43 mg/dL — ABNORMAL HIGH (ref 0.44–1.00)
GFR calc Af Amer: 40 mL/min — ABNORMAL LOW (ref >60)
GFR calc non Af Amer: 35 mL/min — ABNORMAL LOW (ref >60)
Glucose, Bld: 149 mg/dL — ABNORMAL HIGH (ref 65–99)
Potassium: 4.1 mmol/L (ref 3.5–5.1)
Sodium: 140 mmol/L (ref 135–145)

## 2016-08-11 LAB — CBC
HCT: 30.1 % — ABNORMAL LOW (ref 36.0–46.0)
Hemoglobin: 10.2 g/dL — ABNORMAL LOW (ref 12.0–15.0)
MCH: 30.4 pg (ref 26.0–34.0)
MCHC: 33.9 g/dL (ref 30.0–36.0)
MCV: 89.9 fL (ref 78.0–100.0)
Platelets: 168 10*3/uL (ref 150–400)
RBC: 3.35 MIL/uL — ABNORMAL LOW (ref 3.87–5.11)
RDW: 13 % (ref 11.5–15.5)
WBC: 6.7 10*3/uL (ref 4.0–10.5)

## 2016-08-11 SURGERY — ECHOCARDIOGRAM, TRANSESOPHAGEAL
Anesthesia: Monitor Anesthesia Care

## 2016-08-11 MED ORDER — PROPOFOL 500 MG/50ML IV EMUL
INTRAVENOUS | Status: DC | PRN
  Administered 2016-08-11: 60 ug/kg/min via INTRAVENOUS

## 2016-08-11 MED ORDER — PROPOFOL 10 MG/ML IV BOLUS
INTRAVENOUS | Status: DC | PRN
  Administered 2016-08-11: 40 mg via INTRAVENOUS
  Administered 2016-08-11: 10 mg via INTRAVENOUS
  Administered 2016-08-11: 20 mg via INTRAVENOUS

## 2016-08-11 MED ORDER — LIDOCAINE HCL (CARDIAC) 20 MG/ML IV SOLN
INTRAVENOUS | Status: DC | PRN
  Administered 2016-08-11: 60 mg via INTRATRACHEAL

## 2016-08-11 MED ORDER — LACTATED RINGERS IV SOLN
INTRAVENOUS | Status: DC | PRN
  Administered 2016-08-11: 11:00:00 via INTRAVENOUS

## 2016-08-11 MED ORDER — DILTIAZEM HCL-DEXTROSE 100-5 MG/100ML-% IV SOLN (PREMIX)
5.0000 mg/h | INTRAVENOUS | Status: DC
  Administered 2016-08-11: 5 mg/h via INTRAVENOUS
  Filled 2016-08-11: qty 100

## 2016-08-11 MED ORDER — BUTAMBEN-TETRACAINE-BENZOCAINE 2-2-14 % EX AERO
INHALATION_SPRAY | CUTANEOUS | Status: DC | PRN
  Administered 2016-08-11: 2 via TOPICAL

## 2016-08-11 NOTE — Progress Notes (Signed)
Echocardiogram Echocardiogram Transesophageal has been performed.  Aggie Cosier 08/11/2016, 12:11 PM

## 2016-08-11 NOTE — Transfer of Care (Signed)
Immediate Anesthesia Transfer of Care Note  Patient: [name]  Procedure(s) Performed: Procedure(s): TRANSESOPHAGEAL ECHOCARDIOGRAM (TEE) (N/A) CARDIOVERSION (N/A)  Patient Location: Endoscopy Unit  Anesthesia Type:MAC  Level of Consciousness: awake, alert  and oriented  Airway & Oxygen Therapy: Patient Spontanous Breathing and Patient connected to nasal cannula oxygen  Post-op Assessment: Report given to RN, Post -op Vital signs reviewed and stable and Patient moving all extremities X 4  Post vital signs: Reviewed and stable  Last Vitals:  Vitals:   08/11/16 1000 08/11/16 1100  BP: (!) 148/78 (!) 169/78  Pulse: (!) 130 (!) 130  Resp: (!) 30 17  Temp:  36.8 C    Last Pain:  Vitals:   08/11/16 1100  TempSrc: Oral  PainSc:          Complications: No apparent anesthesia complications

## 2016-08-11 NOTE — Progress Notes (Signed)
1720-1735:  Pt seemed to be back in AFib. Ordered a ekg to confirm,  But pt converts back to SB with a pause 2.47 sec preceeding.  Notified Dr. Stanford Breed while pt was still in afib and he was on phone when pt had long pause.  States, "still hold off on Cardizem for now due to the pauses"  Page PA to see if they can look at patient.  Paged on call cardiology pager at this time.  Waiting for response.  Pt seems to be ok at this time.  No s/s of any acute distress.  BP 169/48.

## 2016-08-11 NOTE — Anesthesia Preprocedure Evaluation (Addendum)
Anesthesia Evaluation  Patient identified by MRN, date of birth, ID band Patient awake    Reviewed: Allergy & Precautions, H&P , NPO status , Patient's Chart, lab work & pertinent test results  History of Anesthesia Complications (+) PONV and history of anesthetic complications  Airway Mallampati: II  TM Distance: >3 FB Neck ROM: Full    Dental  (+) Edentulous Upper   Pulmonary pneumonia, resolved,    Pulmonary exam normal breath sounds clear to auscultation       Cardiovascular Exercise Tolerance: Poor hypertension, (-) angina+ CAD, + Past MI and +CHF  negative cardio ROS Normal cardiovascular exam Rhythm:Regular Rate:Normal     Neuro/Psych Anxiety Depression  Neuromuscular disease    GI/Hepatic Neg liver ROS, GERD  Medicated and Controlled,  Endo/Other  diabetes, Well Controlled, Type 2Hypothyroidism   Renal/GU CRFRenal disease  negative genitourinary   Musculoskeletal negative musculoskeletal ROS (+)   Abdominal   Peds negative pediatric ROS (+)  Hematology  (+) Blood dyscrasia, anemia ,   Anesthesia Other Findings   Reproductive/Obstetrics negative OB ROS                             Anesthesia Physical  Anesthesia Plan  ASA: III  Anesthesia Plan: MAC   Post-op Pain Management:    Induction: Intravenous  Airway Management Planned: Simple Face Mask  Additional Equipment:   Intra-op Plan:   Post-operative Plan: Extubation in OR  Informed Consent: I have reviewed the patients History and Physical, chart, labs and discussed the procedure including the risks, benefits and alternatives for the proposed anesthesia with the patient or authorized representative who has indicated his/her understanding and acceptance.   Dental advisory given  Plan Discussed with: CRNA  Anesthesia Plan Comments:         Anesthesia Quick Evaluation

## 2016-08-11 NOTE — Progress Notes (Signed)
SUBJECTIVE: The patient is doing well today.  At this time, she denies chest pain, shortness of breath, or any new concerns.  CURRENT MEDICATIONS: . aspirin  81 mg Oral Daily  . docusate sodium  100 mg Oral BID  . doxazosin  1 mg Oral Daily  . gabapentin  300 mg Oral Daily  . gabapentin  600 mg Oral QHS  . hydroxypropyl methylcellulose / hypromellose   Both Eyes BID  . insulin aspart  0-15 Units Subcutaneous TID WC  . insulin glargine  14 Units Subcutaneous Daily  . levothyroxine  50 mcg Oral QAC breakfast  . multivitamin  1 tablet Oral Daily  . pantoprazole  40 mg Oral Daily  . rivaroxaban  15 mg Oral QAC supper  . sodium chloride flush  3 mL Intravenous Q12H   . sodium chloride 20 mL/hr at 08/10/16 2011  . diltiazem (CARDIZEM) infusion 15 mg/hr (08/11/16 0230)    OBJECTIVE: Physical Exam: Vitals:   08/11/16 0333 08/11/16 0400 08/11/16 0500 08/11/16 0600  BP: 126/64     Pulse: 88 (!) 124 (!) 115 (!) 101  Resp: 19 20 16 18   Temp: 98.5 F (36.9 C)     TempSrc: Oral     SpO2: 94% 96% 94% 95%  Weight: 132 lb 14.4 oz (60.3 kg)     Height:        Intake/Output Summary (Last 24 hours) at 08/11/16 0726 Last data filed at 08/11/16 0600  Gross per 24 hour  Intake          1034.33 ml  Output             1300 ml  Net          -265.67 ml    Telemetry reveals atrial flutter at 130  GEN- The patient is well appearing, alert and oriented x 3 today.   Head- normocephalic, atraumatic Eyes-  Sclera clear, conjunctiva pink Ears- hearing intact Oropharynx- clear Neck- supple  Lungs- Clear to ausculation bilaterally, normal work of breathing Heart- Tachycardic regular rate and rhythm  GI- soft, NT, ND, + BS Extremities- no clubbing, cyanosis, or edema Skin- no rash or lesion Psych- euthymic mood, full affect Neuro- strength and sensation are intact  LABS: Basic Metabolic Panel:  Recent Labs  08/10/16 0259 08/11/16 0210  NA 140 140  K 4.8 4.1  CL 112* 108  CO2  23 22  GLUCOSE 157* 149*  BUN 21* 22*  CREATININE 1.55* 1.43*  CALCIUM 8.7* 8.9   CBC:  Recent Labs  08/09/16 0455 08/11/16 0210  WBC 8.0 6.7  HGB 11.5* 10.2*  HCT 33.3* 30.1*  MCV 90.0 89.9  PLT 211 168   Cardiac Enzymes:  Recent Labs  08/08/16 2231 08/09/16 0455 08/09/16 1314  TROPONINI 0.07* 0.07* 0.05*   Hemoglobin A1C:  Recent Labs  08/08/16 2231  HGBA1C 6.4*   Fasting Lipid Panel:  Recent Labs  08/08/16 2130  CHOL 330*  HDL 42  LDLCALC 225*  TRIG 313*  CHOLHDL 7.9   Thyroid Function Tests:  Recent Labs  08/08/16 1606  TSH 2.306    RADIOLOGY: Dg Chest 2 View Result Date: 08/08/2016 CLINICAL DATA:  Shortness of breath. EXAM: CHEST  2 VIEW COMPARISON:  Two-view chest x-ray 05/23/2016. FINDINGS: The heart is enlarged. There is no edema or effusion to suggest failure. Atherosclerotic changes are present at the aortic arch. No focal airspace disease is present. The visualized soft tissues and bony thorax are unremarkable. IMPRESSION:  Stable cardiomegaly without failure. Electronically Signed   By: San Morelle M.D.   On: 08/08/2016 16:41    ASSESSMENT AND PLAN:  Principal Problem:   Atrial flutter with rapid ventricular response (HCC) Active Problems:   Diabetes mellitus, type 2 (HCC)   Hyperlipidemia LDL goal <70   S/P CABG (coronary artery bypass graft), 12/04/11   HTN (hypertension)   Coronary artery disease   CKD (chronic kidney disease), stage III   Hypertensive urgency   Chronic diastolic CHF (congestive heart failure) (HCC)   Elevated troponin  1.  Atypical atrial flutter Plan for TEE/DCCV today.   CHADS2VASC is 6.  Continue Xarelto (15mg  daily with CrCl)  2.  Sinus bradycardia Symptomatic in the past May require pacing. Will evaluate sinus rates after cardioversion  3.  HTN  BP elevated this morning Will follow for now  4.  CAD s/p CABG No recent ischemic symptoms Continue current therapy No BB with prior  bradycardia   Chanetta Marshall, NP 08/11/2016 7:26 AM  I have seen and examined this patient with Chanetta Marshall.  Agree with above, note added to reflect my findings.  On exam, regular rhythm, no murmurs, lungs clear.  Converted to AF overnight but back in flutter today.  Will potentially need antiarrhythmic post cardioversion.  May also require pacemaker if she has significant bradycardia after DCCV..    Will M. Camnitz MD 08/11/2016 8:39 AM

## 2016-08-11 NOTE — Progress Notes (Signed)
Patient has recurrent tachycardia, possible 2:1 atrial flutter with RVR, HR 120s, BP 169/79. Post cardioversion today, she has significant junctional rhythm followed by sinus bradycardia. EP likely to discuss with patient regarding possible PPM tomorrow. NPO past midnight. Hesitant to start amiodarone gtt without pacemaker backup. Advised nurse to use only low dose IV diltiazem for now which allows [place] to quickly stop if she develop significant bradycardia or post conversion pause.   Hilbert Corrigan PA Pager: [phone]

## 2016-08-11 NOTE — CV Procedure (Signed)
See full TEE report in [facility] Pt sedated by anesthesia with lidocaine 40 mg and diprovan 100 mg Normal LV function No LAA thrombus Pt subsequently had DCCV 120J to sinus bradycardia with occasional junctional beats (HR 30s and then 40s). DC cardizem Continue xarelto. Kirk Ruths, MD

## 2016-08-11 NOTE — Progress Notes (Signed)
PROGRESS NOTE    MONISHA SIEBEL  CWC:[id number] DOB: Apr 17, 1941 DOA: 08/08/2016 PCP: Ileana Roup, MD    Brief Narrative: [name] is a 75 y.o. female with medical history significant of CAD (s/p CABG in 2013), DM, HTN, diastolic CHF, CKD, and hypothyroidism presenting with issues with BP since last hospitalization.  Very sensitive to medications, lots of side effects.  The last medication, Clonidine, either caused bradycardia or BP was too high.  Changed to doxazosin with Norvasc while coming off Clonidine, but in the last few days BP increasing and pulse increasing.  Feeling very shaky, weak.  Also feels weak from other medications.  Denies chest pain.  Occasional SOB.  Headaches around R eye and temporal region for the last few weeks, comes and goes.  Occasional blurry vision, worsening - unsure how long this has been going on.  Chronic neuropathy in feet and legs.  Dizziness, takes Meclizine.  A bit unsteady on her feet recently.  CAGB 2013.  Last stress test was Jun 03, 2016.  Normal.  Last cath was 2013.  Patient presents with A fib RVR, started on xarelto 10-17. Plan for TEE/Cardiovertion 10-18.   Assessment & Plan:   Principal Problem:   Atrial flutter with rapid ventricular response (HCC) Active Problems:   Diabetes mellitus, type 2 (HCC)   Hyperlipidemia LDL goal <70   S/P CABG (coronary artery bypass graft), 12/04/11   HTN (hypertension)   Coronary artery disease   CKD (chronic kidney disease), stage III   Hypertensive urgency   Chronic diastolic CHF (congestive heart failure) (HCC)   Elevated troponin  Atrial fibrillation Patient presenting with new-onset afib (she did have apparent afib following her CABG but converted to NSR and has not had recurrence). Echocardiogram pending TSH 2.3.  FLP , LDL 225.  and Hg-A1c 6.4 On IV Cardizem, rate not controlled on high dose. Cardiology consulted. '  Started on xarelto 10-17, with plan for TEE/ DCCV on  10-18  Hypertensive urgency vs. Poorly controlled HTN -Continue with alpha-blocker (new) -She had difficulty with ARB therapy due to hyperkalemia/[name],   -She had bradycardia from Clonidine and so beta blocker therapy may have similar issues. -BP stable on Cardizem.   DM -Hold Janumet but continue Lantus -Cover with SSI  CAD -h/o CABG in 2013. -started on aspirin.   HLD -No apparent recent lipid panel -Not apparently taking a statin., adverse effect from crestor.  -LDL goal should be <70 -patients wants to follow up with PCP to discussed further therapy   CKD -Current creatinine of 1.39 appears to be better than usual baseline of 1.5 -stable.      DVT prophylaxis: Heparin.  Code Status: Full code.  Family Communication: care discussed with patient.   Disposition Plan: remain in the step down units.   Consultants:   Cardiology   Procedures:  ECHO.   Antimicrobials: none  Subjective: Denies chest pain, or dyspnea.  Awaiting procedure.   Objective: Vitals:   08/11/16 0333 08/11/16 0400 08/11/16 0500 08/11/16 0600  BP: 126/64     Pulse: 88 (!) 124 (!) 115 (!) 101  Resp: 19 20 16 18   Temp: 98.5 F (36.9 C)     TempSrc: Oral     SpO2: 94% 96% 94% 95%  Weight: 60.3 kg (132 lb 14.4 oz)     Height:        Intake/Output Summary (Last 24 hours) at 08/11/16 0711 Last data filed at 08/11/16 0600  Gross per 24  hour  Intake          1034.33 ml  Output             1300 ml  Net          -265.67 ml   Filed Weights   08/09/16 2024 08/10/16 0400 08/11/16 0333  Weight: 59.7 kg (131 lb 11.2 oz) 59.4 kg (131 lb) 60.3 kg (132 lb 14.4 oz)    Examination:  General exam: Appears calm and comfortable  Respiratory system: Clear to auscultation. Respiratory effort normal. Cardiovascular system: S1 & S2 heard, RRR. No JVD, murmurs, rubs, gallops or clicks. No pedal edema. Gastrointestinal system: Abdomen is nondistended, soft and nontender. No organomegaly or masses  felt. Normal bowel sounds heard. Central nervous system: Alert and oriented. No focal neurological deficits. Extremities: Symmetric 5 x 5 power. Skin: No rashes, lesions or ulcers Psychiatry: Judgement and insight appear normal. Mood & affect appropriate.     Data Reviewed: I have personally reviewed following labs and imaging studies  CBC:  Recent Labs Lab 08/08/16 1426 08/09/16 0455 08/11/16 0210  WBC 8.0 8.0 6.7  HGB 12.6 11.5* 10.2*  HCT 36.8 33.3* 30.1*  MCV 90.6 90.0 89.9  PLT 193 211 993   Basic Metabolic Panel:  Recent Labs Lab 08/08/16 1426 08/09/16 0455 08/10/16 0259 08/11/16 0210  NA 140 141 140 140  K 5.1 4.3 4.8 4.1  CL 108 112* 112* 108  CO2 20* 22 23 22   GLUCOSE 195* 119* 157* 149*  BUN 25* 23* 21* 22*  CREATININE 1.39* 1.22* 1.55* 1.43*  CALCIUM 9.4 9.1 8.7* 8.9   GFR: Estimated Creatinine Clearance: 27.2 mL/min (by C-G formula based on SCr of 1.43 mg/dL (H)). Liver Function Tests: No results for input(s): AST, ALT, ALKPHOS, BILITOT, PROT, ALBUMIN in the last 168 hours. No results for input(s): LIPASE, AMYLASE in the last 168 hours. No results for input(s): AMMONIA in the last 168 hours. Coagulation Profile: No results for input(s): INR, PROTIME in the last 168 hours. Cardiac Enzymes:  Recent Labs Lab 08/08/16 2231 08/09/16 0455 08/09/16 1314  TROPONINI 0.07* 0.07* 0.05*   BNP (last 3 results) No results for input(s): PROBNP in the last 8760 hours. HbA1C:  Recent Labs  08/08/16 2231  HGBA1C 6.4*   CBG:  Recent Labs Lab 08/09/16 2128 08/10/16 0744 08/10/16 1244 08/10/16 1819 08/10/16 2117  GLUCAP 219* 131* 211* 114* 204*   Lipid Profile:  Recent Labs  08/08/16 2130  CHOL 330*  HDL 42  LDLCALC 225*  TRIG 313*  CHOLHDL 7.9   Thyroid Function Tests:  Recent Labs  08/08/16 1606  TSH 2.306   Anemia Panel: No results for input(s): VITAMINB12, FOLATE, FERRITIN, TIBC, IRON, RETICCTPCT in the last 72 hours. Sepsis  Labs: No results for input(s): PROCALCITON, LATICACIDVEN in the last 168 hours.  Recent Results (from the past 240 hour(s))  MRSA PCR Screening     Status: None   Collection Time: 08/08/16  9:08 PM  Result Value Ref Range Status   MRSA by PCR NEGATIVE NEGATIVE Final    Comment:        The GeneXpert MRSA Assay (FDA approved for NASAL specimens only), is one component of a comprehensive MRSA colonization surveillance program. It is not intended to diagnose MRSA infection nor to guide or monitor treatment for MRSA infections.          Radiology Studies: No results found.      Scheduled Meds: . aspirin  81 mg  Oral Daily  . docusate sodium  100 mg Oral BID  . doxazosin  1 mg Oral Daily  . gabapentin  300 mg Oral Daily  . gabapentin  600 mg Oral QHS  . hydroxypropyl methylcellulose / hypromellose   Both Eyes BID  . insulin aspart  0-15 Units Subcutaneous TID WC  . insulin glargine  14 Units Subcutaneous Daily  . levothyroxine  50 mcg Oral QAC breakfast  . multivitamin  1 tablet Oral Daily  . pantoprazole  40 mg Oral Daily  . rivaroxaban  15 mg Oral QAC supper  . sodium chloride flush  3 mL Intravenous Q12H   Continuous Infusions: . sodium chloride 20 mL/hr at 08/10/16 2011  . diltiazem (CARDIZEM) infusion 15 mg/hr (08/11/16 0230)     LOS: 3 days    Time spent: 35 minutes.     Elmarie Shiley, MD [facility] Pager [phone]  If 7PM-7AM, please contact night-coverage [email or web address] Password [email or web address] 08/11/2016, 7:11 AM

## 2016-08-11 NOTE — Anesthesia Postprocedure Evaluation (Signed)
Anesthesia Post Note  Patient: [name]  Procedure(s) Performed: Procedure(s) (LRB): TRANSESOPHAGEAL ECHOCARDIOGRAM (TEE) (N/A) CARDIOVERSION (N/A)  Patient location during evaluation: PACU Anesthesia Type: MAC Level of consciousness: awake and alert Pain management: pain level controlled Vital Signs Assessment: post-procedure vital signs reviewed and stable Respiratory status: spontaneous breathing, nonlabored ventilation, respiratory function stable and patient connected to nasal cannula oxygen Cardiovascular status: stable and blood pressure returned to baseline Anesthetic complications: no    Last Vitals:  Vitals:   08/11/16 1400 08/11/16 1500  BP: (!) 144/104 (!) 163/53  Pulse: (!) 50 (!) 50  Resp: 15 16  Temp:      Last Pain:  Vitals:   08/11/16 1100  TempSrc: Oral  PainSc:                  Reginal Lutes

## 2016-08-12 ENCOUNTER — Encounter (HOSPITAL_COMMUNITY)
Admission: EM | Disposition: A | Discharge status: Home / Self Care | Payer: Self-pay | Source: Home / Self Care | Attending: Internal Medicine

## 2016-08-12 DIAGNOSIS — I4891 Unspecified atrial fibrillation: Secondary | ICD-10-CM

## 2016-08-12 DIAGNOSIS — R748 Abnormal levels of other serum enzymes: Secondary | ICD-10-CM

## 2016-08-12 DIAGNOSIS — Z950 Presence of cardiac pacemaker: Secondary | ICD-10-CM

## 2016-08-12 DIAGNOSIS — I495 Sick sinus syndrome: Secondary | ICD-10-CM

## 2016-08-12 DIAGNOSIS — I10 Essential (primary) hypertension: Secondary | ICD-10-CM

## 2016-08-12 HISTORY — DX: Presence of cardiac pacemaker: Z95.0

## 2016-08-12 HISTORY — PX: EP IMPLANTABLE DEVICE: SHX172B

## 2016-08-12 LAB — GLUCOSE, CAPILLARY
Glucose-Capillary: 125 mg/dL — ABNORMAL HIGH (ref 65–99)
Glucose-Capillary: 117 mg/dL — ABNORMAL HIGH (ref 65–99)
Glucose-Capillary: 121 mg/dL — ABNORMAL HIGH (ref 65–99)
Glucose-Capillary: 118 mg/dL — ABNORMAL HIGH (ref 65–99)

## 2016-08-12 LAB — BASIC METABOLIC PANEL
Anion gap: 6 (ref 5–15)
BUN: 20 mg/dL (ref 6–20)
CO2: 22 mmol/L (ref 22–32)
Calcium: 8.6 mg/dL — ABNORMAL LOW (ref 8.9–10.3)
Chloride: 113 mmol/L — ABNORMAL HIGH (ref 101–111)
Creatinine, Ser: 1.5 mg/dL — ABNORMAL HIGH (ref 0.44–1.00)
GFR calc Af Amer: 38 mL/min — ABNORMAL LOW (ref >60)
GFR calc non Af Amer: 33 mL/min — ABNORMAL LOW (ref >60)
Glucose, Bld: 107 mg/dL — ABNORMAL HIGH (ref 65–99)
Potassium: 4.3 mmol/L (ref 3.5–5.1)
Sodium: 141 mmol/L (ref 135–145)

## 2016-08-12 SURGERY — PACEMAKER IMPLANT

## 2016-08-12 MED ORDER — FENTANYL CITRATE (PF) 100 MCG/2ML IJ SOLN
INTRAMUSCULAR | Status: DC | PRN
Start: 2016-08-12 — End: 2016-08-12
  Administered 2016-08-12 (×2): 25 ug via INTRAVENOUS

## 2016-08-12 MED ORDER — LABETALOL HCL 5 MG/ML IV SOLN
INTRAVENOUS | Status: AC
  Filled 2016-08-12: qty 4

## 2016-08-12 MED ORDER — CEFAZOLIN SODIUM-DEXTROSE 2-4 GM/100ML-% IV SOLN
2.0000 g | INTRAVENOUS | Status: AC
  Administered 2016-08-12: 2 g via INTRAVENOUS

## 2016-08-12 MED ORDER — ONDANSETRON HCL 4 MG/2ML IJ SOLN
4.0000 mg | Freq: Four times a day (QID) | INTRAMUSCULAR | Status: DC | PRN
  Administered 2016-08-12: 4 mg via INTRAVENOUS

## 2016-08-12 MED ORDER — SODIUM CHLORIDE 0.9 % IR SOLN
Status: AC
  Filled 2016-08-12: qty 2

## 2016-08-12 MED ORDER — CHLORHEXIDINE GLUCONATE 4 % EX LIQD
60.0000 mL | Freq: Once | CUTANEOUS | Status: AC
  Administered 2016-08-12: 4 via TOPICAL
  Filled 2016-08-12: qty 30

## 2016-08-12 MED ORDER — FENTANYL CITRATE (PF) 100 MCG/2ML IJ SOLN
INTRAMUSCULAR | Status: AC
Start: 2016-08-12 — End: 2016-08-12
  Filled 2016-08-12: qty 2

## 2016-08-12 MED ORDER — SODIUM CHLORIDE 0.9 % IR SOLN
80.0000 mg | Status: AC
  Administered 2016-08-12: 80 mg

## 2016-08-12 MED ORDER — LIDOCAINE HCL (PF) 1 % IJ SOLN
INTRAMUSCULAR | Status: DC | PRN
  Administered 2016-08-12: 45 mL via INTRADERMAL

## 2016-08-12 MED ORDER — CEFAZOLIN SODIUM-DEXTROSE 2-4 GM/100ML-% IV SOLN
INTRAVENOUS | Status: AC
  Filled 2016-08-12: qty 100

## 2016-08-12 MED ORDER — CHLORHEXIDINE GLUCONATE 4 % EX LIQD: 60.0000 mL | Freq: Once | CUTANEOUS | Status: DC

## 2016-08-12 MED ORDER — MIDAZOLAM HCL 5 MG/5ML IJ SOLN
INTRAMUSCULAR | Status: DC | PRN
  Administered 2016-08-12: 2 mg via INTRAVENOUS
  Administered 2016-08-12: 1 mg via INTRAVENOUS

## 2016-08-12 MED ORDER — ACETAMINOPHEN 325 MG PO TABS: 325.0000 mg | ORAL_TABLET | ORAL | Status: DC | PRN

## 2016-08-12 MED ORDER — MIDAZOLAM HCL 5 MG/5ML IJ SOLN
INTRAMUSCULAR | Status: AC
  Filled 2016-08-12: qty 5

## 2016-08-12 MED ORDER — CEFAZOLIN IN D5W 1 GM/50ML IV SOLN
1.0000 g | Freq: Four times a day (QID) | INTRAVENOUS | Status: AC
  Administered 2016-08-12 – 2016-08-13 (×3): 1 g via INTRAVENOUS
  Filled 2016-08-12 (×3): qty 50

## 2016-08-12 MED ORDER — HEPARIN (PORCINE) IN NACL 2-0.9 UNIT/ML-% IJ SOLN
INTRAMUSCULAR | Status: AC
  Filled 2016-08-12: qty 500

## 2016-08-12 MED ORDER — SODIUM CHLORIDE 0.9 % IV SOLN: INTRAVENOUS | Status: DC

## 2016-08-12 MED ORDER — LIDOCAINE HCL (PF) 1 % IJ SOLN
INTRAMUSCULAR | Status: AC
  Filled 2016-08-12: qty 60

## 2016-08-12 MED ORDER — LABETALOL HCL 5 MG/ML IV SOLN
10.0000 mg | Freq: Once | INTRAVENOUS | Status: AC
  Administered 2016-08-12: 10 mg via INTRAVENOUS

## 2016-08-12 SURGICAL SUPPLY — 7 items
CABLE SURGICAL S-101-97-12 (CABLE) ×2 IMPLANT
LEAD TENDRIL MRI 46CM LPA1200M (Lead) ×2 IMPLANT
LEAD TENDRIL MRI 52CM LPA1200M (Lead) ×2 IMPLANT
PACEMAKER ASSURITY DR-RF (Pacemaker) ×2 IMPLANT
PAD DEFIB LIFELINK (PAD) ×2 IMPLANT
SHEATH CLASSIC 8F (SHEATH) ×4 IMPLANT
TRAY PACEMAKER INSERTION (PACKS) ×2 IMPLANT

## 2016-08-12 NOTE — Progress Notes (Signed)
Patient ID: [name], female   DOB: [birth year], 75 y.o.   MRN: [id number]  PROGRESS NOTE    IZZAH PASQUA  PRF:[id number] DOB: May 21, 1941 DOA: 08/08/2016  PCP: Ileana Roup, MD   Brief Narrative:  75 y.o.femalewith medical history significant for CAD (s/p CABG in 2013), DM, HTN, diastolic CHF, CKD, and hypothyroidism who presented to [facility] with A. fib with RVR, started on Xarelto 08/10/2016 and underwent TEE and cardioversion 08/11/2016.   Assessment & Plan:  Atrial fibrillation with RVR / Atrial flutter  - CHADS2VASC is 6 - TEE/DCCV yesterday with recurrent atrial flutter overnight - Per cardio, pt will likely need amiodarone; currently on Cardizem drip  - TSH 2.3.  - Continue xarelto  - Cardiology is following, appreciate their input  Accelerated hypertension - Continue Doxazosin  - Also, on Cardizem drip   Troponin elevation - Likely demand ischemia from combination of A. fib, a flutter and accelerated hypertension - TEE done 08/11/2016, ejection fraction 55% - No reports of chest pain  Type 2 diabetes mellitus with diabetic nephropathy with long-term insulin use - Hold Janumet - Patient is currently on Lantus 14 units daily and sliding scale insulin - Continue gabapentin for neuropathy  CAD, native artery without angina pectoris  - H/o CABG in 2013. - Sarted on aspirin   dyslipidemia associated with type 2 diabetes mellitus - Not apparently taking a statin., adverse effect from crestor.  - LDL goal should be <70 - Patients wants to follow up with PCP to discussed further therapy   CKD, stage 3  - Baseline creatinine around 1.5  - Creatinine on this admission with an baseline values   Anemia of chronic kidney disease - Hemoglobin stable at 10.2    DVT prophylaxis: Xarelto  Code Status: full code  Family Communication: daughter at the bedside this am  Disposition Plan: home once cleared by cardiology    Consultants:    Cardiology   Procedures:   TEE 08/11/2016 - EF 55%, no LAA thrombus   Antimicrobials:   None    Subjective: No overnight events.   Objective: Vitals:   08/11/16 1900 08/11/16 2326 08/12/16 0425 08/12/16 0809  BP: (!) 162/63 (!) 150/75  (!) 179/97  Pulse: (!) 47 (!) 127 (!) 128 (!) 134  Resp: 18 17 18 20   Temp: 98.4 F (36.9 C) 98.9 F (37.2 C) 98.5 F (36.9 C) 97.8 F (36.6 C)  TempSrc: Oral Oral Oral Oral  SpO2: 99% 96% 94% 97%  Weight:   60.2 kg (132 lb 12.8 oz)   Height:        Intake/Output Summary (Last 24 hours) at 08/12/16 1111 Last data filed at 08/12/16 1004  Gross per 24 hour  Intake           884.83 ml  Output             1100 ml  Net          -215.17 ml   Filed Weights   08/11/16 0333 08/11/16 1100 08/12/16 0425  Weight: 60.3 kg (132 lb 14.4 oz) 60.3 kg (132 lb 14.4 oz) 60.2 kg (132 lb 12.8 oz)    Examination:  General exam: Appears calm and comfortable  Respiratory system: Clear to auscultation. Respiratory effort normal. Cardiovascular system: S1 & S2 heard, tachycardic (+) Gastrointestinal system: Abdomen is nondistended, soft and nontender. No organomegaly or masses felt. Normal bowel sounds heard. Central nervous system: Alert and oriented. No focal neurological deficits. Extremities:  Symmetric 5 x 5 power. Skin: No rashes, lesions or ulcers Psychiatry: Judgement and insight appear normal. Mood & affect appropriate.   Data Reviewed: I have personally reviewed following labs and imaging studies  CBC:  Recent Labs Lab 08/08/16 1426 08/09/16 0455 08/11/16 0210  WBC 8.0 8.0 6.7  HGB 12.6 11.5* 10.2*  HCT 36.8 33.3* 30.1*  MCV 90.6 90.0 89.9  PLT 193 211 185   Basic Metabolic Panel:  Recent Labs Lab 08/08/16 1426 08/09/16 0455 08/10/16 0259 08/11/16 0210 08/12/16 0229  NA 140 141 140 140 141  K 5.1 4.3 4.8 4.1 4.3  CL 108 112* 112* 108 113*  CO2 20* 22 23 22 22   GLUCOSE 195* 119* 157* 149* 107*  BUN 25* 23* 21* 22* 20   CREATININE 1.39* 1.22* 1.55* 1.43* 1.50*  CALCIUM 9.4 9.1 8.7* 8.9 8.6*   GFR: Estimated Creatinine Clearance: 25.9 mL/min (by C-G formula based on SCr of 1.5 mg/dL (H)). Liver Function Tests: No results for input(s): AST, ALT, ALKPHOS, BILITOT, PROT, ALBUMIN in the last 168 hours. No results for input(s): LIPASE, AMYLASE in the last 168 hours. No results for input(s): AMMONIA in the last 168 hours. Coagulation Profile: No results for input(s): INR, PROTIME in the last 168 hours. Cardiac Enzymes:  Recent Labs Lab 08/08/16 2231 08/09/16 0455 08/09/16 1314  TROPONINI 0.07* 0.07* 0.05*   BNP (last 3 results) No results for input(s): PROBNP in the last 8760 hours. HbA1C: No results for input(s): HGBA1C in the last 72 hours. CBG:  Recent Labs Lab 08/10/16 2117 08/11/16 0831 08/11/16 1721 08/11/16 2120 08/12/16 0808  GLUCAP 204* 138* 158* 123* 125*   Lipid Profile: No results for input(s): CHOL, HDL, LDLCALC, TRIG, CHOLHDL, LDLDIRECT in the last 72 hours. Thyroid Function Tests: No results for input(s): TSH, T4TOTAL, FREET4, T3FREE, THYROIDAB in the last 72 hours. Anemia Panel: No results for input(s): VITAMINB12, FOLATE, FERRITIN, TIBC, IRON, RETICCTPCT in the last 72 hours. Urine analysis:    Component Value Date/Time   COLORURINE YELLOW 01/04/2012 1105   APPEARANCEUR CLOUDY (A) 01/04/2012 1105   LABSPEC 1.015 01/04/2012 1105   PHURINE 5.0 01/04/2012 1105   GLUCOSEU NEGATIVE 01/04/2012 1105   HGBUR NEGATIVE 01/04/2012 1105   BILIRUBINUR NEGATIVE 01/04/2012 1105   KETONESUR NEGATIVE 01/04/2012 1105   PROTEINUR NEGATIVE 01/04/2012 1105   UROBILINOGEN 0.2 01/04/2012 1105   NITRITE NEGATIVE 01/04/2012 1105   LEUKOCYTESUR MODERATE (A) 01/04/2012 1105   Sepsis Labs: @LABRCNTIP (procalcitonin:4,lacticidven:4)    Recent Results (from the past 240 hour(s))  MRSA PCR Screening     Status: None   Collection Time: 08/08/16  9:08 PM  Result Value Ref Range Status    MRSA by PCR NEGATIVE NEGATIVE Final      Radiology Studies: Dg Chest 2 View Result Date: 08/08/2016 Stable cardiomegaly without failure. Electronically Signed   By: San Morelle M.D.   On: 08/08/2016 16:41     Scheduled Meds: . aspirin  81 mg Oral Daily  .  ceFAZolin (ANCEF) IV  2 g Intravenous To SSTC  . chlorhexidine  60 mL Topical Once  . chlorhexidine  60 mL Topical Once  . docusate sodium  100 mg Oral BID  . doxazosin  1 mg Oral Daily  . gabapentin  300 mg Oral Daily  . gabapentin  600 mg Oral QHS  . gentamicin irrigation  80 mg Irrigation To SSTC  . hydroxypropyl methylcellulose / hypromellose   Both Eyes BID  . insulin aspart  0-15 Units  Subcutaneous TID WC  . insulin glargine  14 Units Subcutaneous Daily  . levothyroxine  50 mcg Oral QAC breakfast  . multivitamin  1 tablet Oral Daily  . pantoprazole  40 mg Oral Daily  . rivaroxaban  15 mg Oral QAC supper  . sodium chloride flush  3 mL Intravenous Q12H   Continuous Infusions: . sodium chloride    . diltiazem (CARDIZEM) infusion 5 mg/hr (08/12/16 0600)     LOS: 4 days    Time spent: 25 minutes  Greater than 50% of the time spent on counseling and coordinating the care.   Leisa Lenz, MD [facility] Pager [phone]  If 7PM-7AM, please contact night-coverage [email or web address] Password [email or web address] 08/12/2016, 11:11 AM

## 2016-08-12 NOTE — Progress Notes (Signed)
SUBJECTIVE: The patient is doing well today.  At this time, she denies chest pain, shortness of breath, or any new concerns.  She is frustrated by recurrent flutter.   CURRENT MEDICATIONS: . aspirin  81 mg Oral Daily  . docusate sodium  100 mg Oral BID  . doxazosin  1 mg Oral Daily  . gabapentin  300 mg Oral Daily  . gabapentin  600 mg Oral QHS  . hydroxypropyl methylcellulose / hypromellose   Both Eyes BID  . insulin aspart  0-15 Units Subcutaneous TID WC  . insulin glargine  14 Units Subcutaneous Daily  . levothyroxine  50 mcg Oral QAC breakfast  . multivitamin  1 tablet Oral Daily  . pantoprazole  40 mg Oral Daily  . rivaroxaban  15 mg Oral QAC supper  . sodium chloride flush  3 mL Intravenous Q12H   . diltiazem (CARDIZEM) infusion 5 mg/hr (08/12/16 0400)    OBJECTIVE: Physical Exam: Vitals:   08/11/16 1800 08/11/16 1900 08/11/16 2326 08/12/16 0425  BP: (!) 170/94 (!) 162/63 (!) 150/75   Pulse: 96 (!) 47 (!) 127 (!) 128  Resp: 16 18 17 18   Temp:  98.4 F (36.9 C) 98.9 F (37.2 C) 98.5 F (36.9 C)  TempSrc:  Oral Oral Oral  SpO2: 100% 99% 96% 94%  Weight:    132 lb 12.8 oz (60.2 kg)  Height:        Intake/Output Summary (Last 24 hours) at 08/12/16 0617 Last data filed at 08/12/16 0200  Gross per 24 hour  Intake           894.83 ml  Output              950 ml  Net           -55.17 ml    Telemetry reveals junctional rhythm following cardioversion with reversion to atrial flutter, rates 130's  GEN- The patient is well appearing, alert and oriented x 3 today.   Head- normocephalic, atraumatic Eyes-  Sclera clear, conjunctiva pink Ears- hearing intact Oropharynx- clear Neck- supple  Lungs- Clear to ausculation bilaterally, normal work of breathing Heart- Tachycardic irregular rate and rhythm  GI- soft, NT, ND, + BS Extremities- no clubbing, cyanosis, or edema Skin- no rash or lesion Psych- euthymic mood, full affect Neuro- strength and sensation are  intact  LABS: Basic Metabolic Panel:  Recent Labs  08/11/16 0210 08/12/16 0229  NA 140 141  K 4.1 4.3  CL 108 113*  CO2 22 22  GLUCOSE 149* 107*  BUN 22* 20  CREATININE 1.43* 1.50*  CALCIUM 8.9 8.6*   CBC:  Recent Labs  08/11/16 0210  WBC 6.7  HGB 10.2*  HCT 30.1*  MCV 89.9  PLT 168   Cardiac Enzymes:  Recent Labs  08/09/16 1314  TROPONINI 0.05*    RADIOLOGY: Dg Chest 2 View Result Date: 08/08/2016 CLINICAL DATA:  Shortness of breath. EXAM: CHEST  2 VIEW COMPARISON:  Two-view chest x-ray 05/23/2016. FINDINGS: The heart is enlarged. There is no edema or effusion to suggest failure. Atherosclerotic changes are present at the aortic arch. No focal airspace disease is present. The visualized soft tissues and bony thorax are unremarkable. IMPRESSION: Stable cardiomegaly without failure. Electronically Signed   By: San Morelle M.D.   On: 08/08/2016 16:41    ASSESSMENT AND PLAN:  Principal Problem:   Atrial flutter with rapid ventricular response (HCC) Active Problems:   Diabetes mellitus, type 2 (HCC)   Hyperlipidemia  LDL goal <70   S/P CABG (coronary artery bypass graft), 12/04/11   HTN (hypertension)   Coronary artery disease   CKD (chronic kidney disease), stage III   Hypertensive urgency   Chronic diastolic CHF (congestive heart failure) (HCC)   Elevated troponin  1.  Atypical atrial flutter TEE/DCCV yesterday with recurrent atrial flutter overnight.  She had junctional rhythm following cardioversion   With CAD and CKD, will likely need to use amiodarone for rhythm control. I have not yet discussed side effects with patient. Will defer to Dr Curt Bears.  CHADS2VASC is 6.  Continue Xarelto (15mg  daily with CrCl)  2.  Sinus bradycardia Limits AAD options. Will need pacemaker for tachy/brady syndrome. Risks, benefits discussed with patient who would like to proceed. Will hold Xarelto this morning. Plan for PPM later today with Dr Curt Bears.  3.  HTN    BP elevated this morning Will follow for now  4.  CAD s/p CABG No recent ischemic symptoms Continue current therapy No BB currently with prior bradycardia  Chanetta Marshall, NP 08/12/2016 6:20 AM   I have seen and examined this patient with Chanetta Marshall.  Agree with above, note added to reflect my findings.  On exam, tachycardic, no murmurs, lungs clear.  Had cardioversion but went back into typical appearing atrial flutter.  Had junctional rhythm post cardioversion.  Plan for pacemaker today.  Risks and benefits explained.  Risks include bleeding, tamponade, infection, pneumothorax.  Patient and daughter agree to the procedure. Will begin amiodarone post procedure.  Will M. Camnitz MD 08/12/2016 11:03 AM

## 2016-08-13 ENCOUNTER — Inpatient Hospital Stay (HOSPITAL_COMMUNITY): Payer: Medicare Other

## 2016-08-13 ENCOUNTER — Encounter (HOSPITAL_COMMUNITY): Payer: Self-pay | Admitting: Cardiology

## 2016-08-13 DIAGNOSIS — I10 Essential (primary) hypertension: Secondary | ICD-10-CM

## 2016-08-13 LAB — CBC
HCT: 29.8 % — ABNORMAL LOW (ref 36.0–46.0)
Hemoglobin: 9.8 g/dL — ABNORMAL LOW (ref 12.0–15.0)
MCH: 30.1 pg (ref 26.0–34.0)
MCHC: 32.9 g/dL (ref 30.0–36.0)
MCV: 91.4 fL (ref 78.0–100.0)
Platelets: 167 10*3/uL (ref 150–400)
RBC: 3.26 MIL/uL — ABNORMAL LOW (ref 3.87–5.11)
RDW: 13.2 % (ref 11.5–15.5)
WBC: 7.7 10*3/uL (ref 4.0–10.5)

## 2016-08-13 LAB — GLUCOSE, CAPILLARY
Glucose-Capillary: 84 mg/dL (ref 65–99)
Glucose-Capillary: 129 mg/dL — ABNORMAL HIGH (ref 65–99)
Glucose-Capillary: 137 mg/dL — ABNORMAL HIGH (ref 65–99)
Glucose-Capillary: 200 mg/dL — ABNORMAL HIGH (ref 65–99)

## 2016-08-13 LAB — BASIC METABOLIC PANEL
Anion gap: 6 (ref 5–15)
BUN: 21 mg/dL — ABNORMAL HIGH (ref 6–20)
CO2: 24 mmol/L (ref 22–32)
Calcium: 8.3 mg/dL — ABNORMAL LOW (ref 8.9–10.3)
Chloride: 109 mmol/L (ref 101–111)
Creatinine, Ser: 1.55 mg/dL — ABNORMAL HIGH (ref 0.44–1.00)
GFR calc Af Amer: 36 mL/min — ABNORMAL LOW (ref >60)
GFR calc non Af Amer: 31 mL/min — ABNORMAL LOW (ref >60)
Glucose, Bld: 76 mg/dL (ref 65–99)
Potassium: 4.1 mmol/L (ref 3.5–5.1)
Sodium: 139 mmol/L (ref 135–145)

## 2016-08-13 MED ORDER — AMIODARONE HCL 200 MG PO TABS
400.0000 mg | ORAL_TABLET | Freq: Two times a day (BID) | ORAL | Status: DC
  Administered 2016-08-13 – 2016-08-14 (×3): 400 mg via ORAL
  Filled 2016-08-13 (×3): qty 2

## 2016-08-13 MED ORDER — AMLODIPINE BESYLATE 10 MG PO TABS
10.0000 mg | ORAL_TABLET | Freq: Every day | ORAL | Status: DC
  Administered 2016-08-13 – 2016-08-14 (×2): 10 mg via ORAL
  Filled 2016-08-13 (×2): qty 1

## 2016-08-13 MED ORDER — BISACODYL 10 MG RE SUPP
10.0000 mg | Freq: Once | RECTAL | Status: AC
  Administered 2016-08-14: 10 mg via RECTAL
  Filled 2016-08-13: qty 1

## 2016-08-13 MED ORDER — DOXAZOSIN MESYLATE 1 MG PO TABS
1.0000 mg | ORAL_TABLET | Freq: Two times a day (BID) | ORAL | Status: DC
  Administered 2016-08-13 – 2016-08-14 (×2): 1 mg via ORAL
  Filled 2016-08-13 (×2): qty 1

## 2016-08-13 MED FILL — Gentamicin Sulfate Inj 40 MG/ML: INTRAMUSCULAR | Qty: 2 | Status: AC

## 2016-08-13 MED FILL — Heparin Sodium (Porcine) 2 Unit/ML in Sodium Chloride 0.9%: INTRAMUSCULAR | Qty: 500 | Status: AC

## 2016-08-13 MED FILL — Sodium Chloride Irrigation Soln 0.9%: Qty: 500 | Status: AC

## 2016-08-13 MED FILL — Cefazolin Sodium-Dextrose IV Solution 2 GM/100ML-4%: INTRAVENOUS | Qty: 100 | Status: AC

## 2016-08-13 NOTE — Progress Notes (Signed)
Patient ID: [name], female   DOB: Dec 02, 1940, 75 y.o.   MRN: [id number]  PROGRESS NOTE    [name]  SWH:[id number] DOB: 07/11/1941 DOA: 08/08/2016  PCP: Ileana Roup, MD   Brief Narrative:  75 y.o.femalewith medical history significant for CAD (s/p CABG in 2013), DM, HTN, diastolic CHF, CKD, and hypothyroidism who presented to [facility] with A. fib with RVR, started on Xarelto 08/10/2016 and underwent TEE and cardioversion 08/11/2016.   Assessment & Plan:  Atrial fibrillation with RVR / Atrial flutter  - CHADS2VASC is 6 TEE/DCCV 10/18 with recurrent atrial flutter overnight.  She had junctional rhythm following cardioversion  Started amiodarone for rhythm control. Would continue as outpatient with 400 mg BID for 2 weeks then 200 mg daily after that. CHADS2VASC is 6. Continue Xarelto (15mg  daily with CrCl) - TSH 2.3.  - Continue xarelto  - Cardiology is following, appreciate their input  Accelerated hypertension - Continue Doxazosin , increase dose, added norvasc     Sinus bradycardia Dual chamber pacemaker placed without issues,Plan for follow up in device clinic in 10 days.   Troponin elevation - Likely demand ischemia from combination of A. fib, a flutter and accelerated hypertension - TEE done 08/11/2016, ejection fraction 55% - No reports of chest pain  Type 2 diabetes mellitus with diabetic nephropathy with long-term insulin use - Hold Janumet - Patient is currently on Lantus 14 units daily and sliding scale insulin - Continue gabapentin for neuropathy  CAD, native artery without angina pectoris  - H/o CABG in 2013. - Sarted on aspirin   dyslipidemia associated with type 2 diabetes mellitus - Not apparently taking a statin., adverse effect from crestor.  - LDL goal should be <70 - Patients wants to follow up with PCP to discussed further therapy   CKD, stage 3  - Baseline creatinine around 1.5  - Creatinine on this  admission with an baseline values   Anemia of chronic kidney disease - Hemoglobin stable at 10.2    DVT prophylaxis: Xarelto  Code Status: full code  Family Communication: daughter at the bedside this am  Disposition Plan: home once cleared by cardiology    Consultants:   Cardiology   Procedures:   TEE 08/11/2016 - EF 55%, no LAA thrombus   Antimicrobials:   None    Subjective: No overnight events. BP elevated this am   Objective: Vitals:   08/13/16 0050 08/13/16 0541 08/13/16 0848 08/13/16 1216  BP: (!) 166/58 (!) 151/43 (!) 206/55 (!) 194/67  Pulse: 75 74 75 72  Resp: 15 16 18  (!) 26  Temp: 98.1 F (36.7 C) 98.1 F (36.7 C) 97.8 F (36.6 C) 98 F (36.7 C)  TempSrc: Oral Oral Oral Oral  SpO2: 95% 97% 98% 99%  Weight:  59.7 kg (131 lb 9.6 oz)    Height:        Intake/Output Summary (Last 24 hours) at 08/13/16 1303 Last data filed at 08/13/16 1001  Gross per 24 hour  Intake              600 ml  Output              650 ml  Net              -50 ml   Filed Weights   08/11/16 1100 08/12/16 0425 08/13/16 0541  Weight: 60.3 kg (132 lb 14.4 oz) 60.2 kg (132 lb 12.8 oz) 59.7 kg (131 lb 9.6 oz)  Examination:  General exam: Appears calm and comfortable  Respiratory system: Clear to auscultation. Respiratory effort normal. Cardiovascular system: S1 & S2 heard, tachycardic (+) Gastrointestinal system: Abdomen is nondistended, soft and nontender. No organomegaly or masses felt. Normal bowel sounds heard. Central nervous system: Alert and oriented. No focal neurological deficits. Extremities: Symmetric 5 x 5 power. Skin: No rashes, lesions or ulcers Psychiatry: Judgement and insight appear normal. Mood & affect appropriate.   Data Reviewed: I have personally reviewed following labs and imaging studies  CBC:  Recent Labs Lab 08/08/16 1426 08/09/16 0455 08/11/16 0210 08/13/16 0508  WBC 8.0 8.0 6.7 7.7  HGB 12.6 11.5* 10.2* 9.8*  HCT 36.8 33.3* 30.1*  29.8*  MCV 90.6 90.0 89.9 91.4  PLT 193 211 168 882   Basic Metabolic Panel:  Recent Labs Lab 08/09/16 0455 08/10/16 0259 08/11/16 0210 08/12/16 0229 08/13/16 0508  NA 141 140 140 141 139  K 4.3 4.8 4.1 4.3 4.1  CL 112* 112* 108 113* 109  CO2 22 23 22 22 24   GLUCOSE 119* 157* 149* 107* 76  BUN 23* 21* 22* 20 21*  CREATININE 1.22* 1.55* 1.43* 1.50* 1.55*  CALCIUM 9.1 8.7* 8.9 8.6* 8.3*   GFR: Estimated Creatinine Clearance: 25 mL/min (by C-G formula based on SCr of 1.55 mg/dL (H)). Liver Function Tests: No results for input(s): AST, ALT, ALKPHOS, BILITOT, PROT, ALBUMIN in the last 168 hours. No results for input(s): LIPASE, AMYLASE in the last 168 hours. No results for input(s): AMMONIA in the last 168 hours. Coagulation Profile: No results for input(s): INR, PROTIME in the last 168 hours. Cardiac Enzymes:  Recent Labs Lab 08/08/16 2231 08/09/16 0455 08/09/16 1314  TROPONINI 0.07* 0.07* 0.05*   BNP (last 3 results) No results for input(s): PROBNP in the last 8760 hours. HbA1C: No results for input(s): HGBA1C in the last 72 hours. CBG:  Recent Labs Lab 08/12/16 1116 08/12/16 1507 08/12/16 1640 08/13/16 0838 08/13/16 1215  GLUCAP 117* 121* 118* 84 129*   Lipid Profile: No results for input(s): CHOL, HDL, LDLCALC, TRIG, CHOLHDL, LDLDIRECT in the last 72 hours. Thyroid Function Tests: No results for input(s): TSH, T4TOTAL, FREET4, T3FREE, THYROIDAB in the last 72 hours. Anemia Panel: No results for input(s): VITAMINB12, FOLATE, FERRITIN, TIBC, IRON, RETICCTPCT in the last 72 hours. Urine analysis:    Component Value Date/Time   COLORURINE YELLOW 01/04/2012 1105   APPEARANCEUR CLOUDY (A) 01/04/2012 1105   LABSPEC 1.015 01/04/2012 1105   PHURINE 5.0 01/04/2012 1105   GLUCOSEU NEGATIVE 01/04/2012 1105   HGBUR NEGATIVE 01/04/2012 1105   BILIRUBINUR NEGATIVE 01/04/2012 1105   KETONESUR NEGATIVE 01/04/2012 1105   PROTEINUR NEGATIVE 01/04/2012 1105    UROBILINOGEN 0.2 01/04/2012 1105   NITRITE NEGATIVE 01/04/2012 1105   LEUKOCYTESUR MODERATE (A) 01/04/2012 1105   Sepsis Labs: @LABRCNTIP (procalcitonin:4,lacticidven:4)    Recent Results (from the past 240 hour(s))  MRSA PCR Screening     Status: None   Collection Time: 08/08/16  9:08 PM  Result Value Ref Range Status   MRSA by PCR NEGATIVE NEGATIVE Final      Radiology Studies: Dg Chest 2 View Result Date: 08/08/2016 Stable cardiomegaly without failure. Electronically Signed   By: San Morelle M.D.   On: 08/08/2016 16:41     Scheduled Meds: . amiodarone  400 mg Oral BID  . amLODipine  10 mg Oral Daily  . aspirin  81 mg Oral Daily  . docusate sodium  100 mg Oral BID  . doxazosin  1 mg Oral Q12H  . gabapentin  300 mg Oral Daily  . gabapentin  600 mg Oral QHS  . hydroxypropyl methylcellulose / hypromellose   Both Eyes BID  . insulin aspart  0-15 Units Subcutaneous TID WC  . insulin glargine  14 Units Subcutaneous Daily  . levothyroxine  50 mcg Oral QAC breakfast  . multivitamin  1 tablet Oral Daily  . pantoprazole  40 mg Oral Daily  . rivaroxaban  15 mg Oral QAC supper  . sodium chloride flush  3 mL Intravenous Q12H   Continuous Infusions: . diltiazem (CARDIZEM) infusion 5 mg/hr (08/12/16 0600)     LOS: 5 days    Time spent: 25 minutes  Greater than 50% of the time spent on counseling and coordinating the care.   Reyne Dumas, MD [facility] Pager [phone]  If 7PM-7AM, please contact night-coverage [email or web address] Password [email or web address] 08/13/2016, 1:03 PM

## 2016-08-13 NOTE — Care Management Important Message (Signed)
Important Message  Patient Details  Name: [name] MRN: [id number] Date of Birth: [birth year]   Medicare Important Message Given:  Yes    Nathen May 08/13/2016, 12:36 PM

## 2016-08-13 NOTE — Evaluation (Signed)
Physical Therapy Evaluation Patient Details Name: [name] MRN: [id number] DOB: [birth year] Today's Date: 08/13/2016   History of Present Illness  75 y.o. female with medical history significant for CAD (s/p CABG in 2013), DM, HTN, diastolic CHF, CKD, and hypothyroidism who presented to [facility] with A. fib with RVR, started on Xarelto 08/10/2016 and underwent TEE and cardioversion 08/11/2016. dual chamber pacemaker placed 10/19    Clinical Impression  Patient is s/p above surgery resulting in functional limitations due to the deficits listed below (see PT Problem List). Patient demonstrates decr balance and decr awareness of safe use of RW. Patient will benefit from skilled PT to increase their independence and safety with mobility to allow discharge to the venue listed below.       Follow Up Recommendations SNF;Supervision/Assistance - 24 hour (if family can provide 24/7 x 5-7 days, home with HHPT) Patient feels between her daughter, her daughter-in-law, and family friends (arrived at end of session) they can arrange 24/7 assist for up to one week. She is however, willing to go to short-term SNF if they cannot work out a schedule.     Equipment Recommendations  None recommended by PT    Recommendations for Other Services OT consult     Precautions / Restrictions Precautions Precautions: ICD/Pacemaker Precaution Comments: patient unable to state PPM precautions re: LUE Required Braces or Orthoses:  (no sling in room) Restrictions Weight Bearing Restrictions: No      Mobility  Bed Mobility Overal bed mobility: Needs Assistance Bed Mobility: Rolling;Sidelying to Sit Rolling: Min guard Sidelying to sit: Min assist       General bed mobility comments: without use of rail requires assist; admits this has become difficult to do at home (PTA)  Transfers Overall transfer level: Needs assistance Equipment used: Rolling walker (2 wheeled);None Transfers: Sit  to/from Stand Sit to Stand: Min guard         General transfer comment: vc for safe use of LUE and RW; closeguard for safety   Ambulation/Gait Ambulation/Gait assistance: Min assist Ambulation Distance (Feet): 180 Feet Assistive device: Rolling walker (2 wheeled);None Gait Pattern/deviations: Step-through pattern;Decreased stride length;Decreased dorsiflexion - left;Shuffle;Drifts right/left Gait velocity: very slow Gait velocity interpretation: <1.8 ft/sec, indicative of risk for recurrent falls (In 11/2015 pt ambulated 2.92 ft/sec at OPPT) General Gait Details: without device, pt reaching for walls, counter, table for UE support; with RW required vc for proper use and to avoid obstacles x 4; unable to problem-solve how to use RW to get through a tight space (educated to turn sideways)  Financial trader Rank[employer] (Stroke Patients Only)       Balance Overall balance assessment: Needs assistance Sitting-balance support: No upper extremity supported;Feet unsupported Sitting balance-Leahy Scale: Fair     Standing balance support: No upper extremity supported Standing balance-Leahy Scale: Fair               High level balance activites: Head turns;Turns;Direction changes High Level Balance Comments: pt runs into objects on her right or left; decr attention when in hallway, but also in her room             Pertinent Vitals/Pain Pain Assessment: No/denies pain    Home Living Family/patient expects to be discharged to:: Private residence Living Arrangements: Alone Available Help at Discharge: Family;Available PRN/intermittently;Friend(s) (?daughter, DIL, close friend of dtr can provide 24/7 amongst) Type of Home: [place]  Access: Stairs to enter Entrance Stairs-Rails: None Entrance Stairs-Number of Steps: 2 Home Layout: One level Home Equipment: Walker - 2 wheels;Cane - single point;Shower seat;Bedside commode;Wheelchair -  [employer] - 4 wheels      Prior Function Level of Independence: Independent         Comments: normally no device; recently using a cane outdoors;      Hand Dominance   Dominant Hand: Right    Extremity/Trunk Assessment   Upper Extremity Assessment: Generalized weakness;LUE deficits/detail (educated on limited use of LUE (no reaching, pulling, pushin)           Lower Extremity Assessment: Generalized weakness      Cervical / Trunk Assessment: Normal  Communication   Communication: No difficulties  Cognition Arousal/Alertness: Awake/alert Behavior During Therapy: WFL for tasks assessed/performed Overall Cognitive Status: No family/caregiver present to determine baseline cognitive functioning       Memory: Decreased short-term memory (per history)              General Comments      Exercises     Assessment/Plan    PT Assessment Patient needs continued PT services  PT Problem List Decreased strength;Decreased balance;Decreased mobility;Decreased cognition;Decreased knowledge of use of DME;Decreased safety awareness;Decreased knowledge of precautions          PT Treatment Interventions DME instruction;Gait training;Stair training;Functional mobility training;Therapeutic activities;Therapeutic exercise;Balance training;Cognitive remediation;Patient/family education    PT Goals (Current goals can be found in the Care Plan section)  Acute Rehab PT Goals Patient Stated Goal: return home (ultimately) PT Goal Formulation: With patient Time For Goal Achievement: 08/20/16 Potential to Achieve Goals: Good    Frequency Min 3X/week   Barriers to discharge Decreased caregiver support unclear if family can provide 24/7 assist    Co-evaluation               End of Session Equipment Utilized During Treatment: Gait belt Activity Tolerance: Patient tolerated treatment well Patient left: in chair;with call bell/phone within reach;with family/visitor  present Nurse Communication: Mobility status;Other (comment) (?24/7 support via family vs SNF)         Time: 1638-4536 PT Time Calculation (min) (ACUTE ONLY): 34 min   Charges:   PT Evaluation $PT Eval Low Complexity: 1 Procedure PT Treatments $Gait Training: 8-22 mins   PT G Codes:        SASSER,LYNN 02-Sep-2016, 4:49 PM Pager [phone]

## 2016-08-13 NOTE — Progress Notes (Signed)
Pt was instructed by tech to call the number on whiteboard for assistance with bathroom assistance. Pt stated that her daughter is a Therapist, sports and would depend on daughter to assist her. Tech again, asked Pt to call her to decrease the chances of a fall.

## 2016-08-13 NOTE — Progress Notes (Signed)
SUBJECTIVE: The patient is doing well today.  At this time, she denies chest pain, shortness of breath, or any new concerns.  Had dual chamber pacemaker placed yesterday without issues.  Cardioverted at that time as well.    CURRENT MEDICATIONS: . amiodarone  400 mg Oral BID  . aspirin  81 mg Oral Daily  . docusate sodium  100 mg Oral BID  . doxazosin  1 mg Oral Daily  . gabapentin  300 mg Oral Daily  . gabapentin  600 mg Oral QHS  . hydroxypropyl methylcellulose / hypromellose   Both Eyes BID  . insulin aspart  0-15 Units Subcutaneous TID WC  . insulin glargine  14 Units Subcutaneous Daily  . levothyroxine  50 mcg Oral QAC breakfast  . multivitamin  1 tablet Oral Daily  . pantoprazole  40 mg Oral Daily  . rivaroxaban  15 mg Oral QAC supper  . sodium chloride flush  3 mL Intravenous Q12H   . diltiazem (CARDIZEM) infusion 5 mg/hr (08/12/16 0600)    OBJECTIVE: Physical Exam: Vitals:   08/12/16 1811 08/12/16 2018 08/13/16 0050 08/13/16 0541  BP: (!) 158/79 (!) 164/64 (!) 166/58 (!) 151/43  Pulse: 79 75 75 74  Resp: 20 15 15 16   Temp:  98 F (36.7 C) 98.1 F (36.7 C) 98.1 F (36.7 C)  TempSrc:  Oral Oral Oral  SpO2: 99% 95% 95% 97%  Weight:    131 lb 9.6 oz (59.7 kg)  Height:        Intake/Output Summary (Last 24 hours) at 08/13/16 0800 Last data filed at 08/13/16 0543  Gross per 24 hour  Intake              360 ml  Output             1050 ml  Net             -690 ml    Telemetry reveals junctional rhythm following cardioversion with reversion to atrial flutter, rates 130's  GEN- The patient is well appearing, alert and oriented x 3 today.   Head- normocephalic, atraumatic Eyes-  Sclera clear, conjunctiva pink Ears- hearing intact Oropharynx- clear Neck- supple  Lungs- Clear to ausculation bilaterally, normal work of breathing Heart- Tachycardic irregular rate and rhythm  GI- soft, NT, ND, + BS Extremities- no clubbing, cyanosis, or edema Skin- no rash or  lesion Psych- euthymic mood, full affect Neuro- strength and sensation are intact  LABS: Basic Metabolic Panel:  Recent Labs  08/12/16 0229 08/13/16 0508  NA 141 139  K 4.3 4.1  CL 113* 109  CO2 22 24  GLUCOSE 107* 76  BUN 20 21*  CREATININE 1.50* 1.55*  CALCIUM 8.6* 8.3*   CBC:  Recent Labs  08/11/16 0210 08/13/16 0508  WBC 6.7 7.7  HGB 10.2* 9.8*  HCT 30.1* 29.8*  MCV 89.9 91.4  PLT 168 167   Cardiac Enzymes: No results for input(s): CKTOTAL, CKMB, CKMBINDEX, TROPONINI in the last 72 hours.  RADIOLOGY: Dg Chest 2 View Result Date: 08/08/2016 CLINICAL DATA:  Shortness of breath. EXAM: CHEST  2 VIEW COMPARISON:  Two-view chest x-ray 05/23/2016. FINDINGS: The heart is enlarged. There is no edema or effusion to suggest failure. Atherosclerotic changes are present at the aortic arch. No focal airspace disease is present. The visualized soft tissues and bony thorax are unremarkable. IMPRESSION: Stable cardiomegaly without failure. Electronically Signed   By: San Morelle M.D.   On: 08/08/2016 16:41  ASSESSMENT AND PLAN:  Principal Problem:   Atrial flutter with rapid ventricular response (HCC) Active Problems:   Diabetes mellitus, type 2 (HCC)   Hyperlipidemia LDL goal <70   S/P CABG (coronary artery bypass graft), 12/04/11   HTN (hypertension)   Coronary artery disease   CKD (chronic kidney disease), stage III   Hypertensive urgency   Chronic diastolic CHF (congestive heart failure) (HCC)   Elevated troponin   Accelerated hypertension  1.  Atypical atrial flutter TEE/DCCV 10/18 with recurrent atrial flutter overnight.  She had junctional rhythm following cardioversion   Started amiodarone for rhythm control. Would continue as outpatient with 400 mg BID for 2 weeks then 200 mg daily after that. CHADS2VASC is 6.  Continue Xarelto (15mg  daily with CrCl)  2.  Sinus bradycardia Dual chamber pacemaker placed without issues.  CXR and interrogation performed  with stable leads.  Plan for follow up in device clinic in 10 days.  3.  HTN  BP elevated this morning Tamari Busic follow for now  4.  CAD s/p CABG No recent ischemic symptoms Continue current therapy No BB currently with prior bradycardia  Sintia Mckissic sign off at this time.  Please do not hesitate to call back if further issues arise.  Jlon Betker Curt Bears, MD 08/13/2016 8:00 AM

## 2016-08-14 DIAGNOSIS — N183 Chronic kidney disease, stage 3 (moderate): Secondary | ICD-10-CM

## 2016-08-14 LAB — COMPREHENSIVE METABOLIC PANEL
ALT: 7 U/L — ABNORMAL LOW (ref 14–54)
AST: 20 U/L (ref 15–41)
Albumin: 2.7 g/dL — ABNORMAL LOW (ref 3.5–5.0)
Alkaline Phosphatase: 97 U/L (ref 38–126)
Anion gap: 7 (ref 5–15)
BUN: 20 mg/dL (ref 6–20)
CO2: 25 mmol/L (ref 22–32)
Calcium: 8.3 mg/dL — ABNORMAL LOW (ref 8.9–10.3)
Chloride: 107 mmol/L (ref 101–111)
Creatinine, Ser: 1.44 mg/dL — ABNORMAL HIGH (ref 0.44–1.00)
GFR calc Af Amer: 40 mL/min — ABNORMAL LOW (ref >60)
GFR calc non Af Amer: 34 mL/min — ABNORMAL LOW (ref >60)
Glucose, Bld: 113 mg/dL — ABNORMAL HIGH (ref 65–99)
Potassium: 4 mmol/L (ref 3.5–5.1)
Sodium: 139 mmol/L (ref 135–145)
Total Bilirubin: 0.5 mg/dL (ref 0.3–1.2)
Total Protein: 5.6 g/dL — ABNORMAL LOW (ref 6.5–8.1)

## 2016-08-14 LAB — CBC
HCT: 27.8 % — ABNORMAL LOW (ref 36.0–46.0)
Hemoglobin: 9.5 g/dL — ABNORMAL LOW (ref 12.0–15.0)
MCH: 30.8 pg (ref 26.0–34.0)
MCHC: 34.2 g/dL (ref 30.0–36.0)
MCV: 90.3 fL (ref 78.0–100.0)
Platelets: 170 10*3/uL (ref 150–400)
RBC: 3.08 MIL/uL — ABNORMAL LOW (ref 3.87–5.11)
RDW: 13.1 % (ref 11.5–15.5)
WBC: 8.1 10*3/uL (ref 4.0–10.5)

## 2016-08-14 LAB — GLUCOSE, CAPILLARY
Glucose-Capillary: 163 mg/dL — ABNORMAL HIGH (ref 65–99)
Glucose-Capillary: 214 mg/dL — ABNORMAL HIGH (ref 65–99)

## 2016-08-14 MED ORDER — DOXAZOSIN MESYLATE 1 MG PO TABS: 1.0000 mg | ORAL_TABLET | Freq: Two times a day (BID) | ORAL | 1 refills | Status: DC

## 2016-08-14 MED ORDER — RIVAROXABAN 15 MG PO TABS: 15.0000 mg | ORAL_TABLET | Freq: Every day | ORAL | 12 refills | Status: DC

## 2016-08-14 MED ORDER — AMIODARONE HCL 400 MG PO TABS: 400.0000 mg | ORAL_TABLET | Freq: Two times a day (BID) | ORAL | 0 refills | Status: DC

## 2016-08-14 MED ORDER — AMIODARONE HCL 200 MG PO TABS: 200.0000 mg | ORAL_TABLET | Freq: Every day | ORAL | 2 refills | Status: DC

## 2016-08-14 MED ORDER — ASPIRIN 81 MG PO CHEW: 81.0000 mg | CHEWABLE_TABLET | Freq: Every day | ORAL | 1 refills | Status: DC

## 2016-08-14 NOTE — Discharge Summary (Signed)
Physician Discharge Summary  [name] MRN: [id number] DOB/AGE: [birth year] 75 y.o.  PCP: Ileana Roup, MD   Admit date: 08/08/2016 Discharge date: 08/14/2016  Discharge Diagnoses:    Principal Problem:   Atrial flutter with rapid ventricular response (HCC) Active Problems:   Diabetes mellitus, type 2 (HCC)   Hyperlipidemia LDL goal <70   S/P CABG (coronary artery bypass graft), 12/04/11   HTN (hypertension)   Coronary artery disease   CKD (chronic kidney disease), stage III   Hypertensive urgency   Chronic diastolic CHF (congestive heart failure) (HCC)   Elevated troponin   Accelerated hypertension    Follow-up recommendations Follow-up with PCP in 3-5 days , including all  additional recommended appointments as below Follow-up CBC, CMP in 3-5 days Patient to follow-up with Will Meredith Leeds, MD      Current Discharge Medication List    START taking these medications   Details  !! amiodarone (PACERONE) 200 MG tablet Take 1 tablet (200 mg total) by mouth daily. Qty: 30 tablet, Refills: 2    !! amiodarone (PACERONE) 400 MG tablet Take 1 tablet (400 mg total) by mouth 2 (two) times daily. Qty: 28 tablet, Refills: 0    aspirin 81 MG chewable tablet Chew 1 tablet (81 mg total) by mouth daily. Qty: 30 tablet, Refills: 1    Rivaroxaban (XARELTO) 15 MG TABS tablet Take 1 tablet (15 mg total) by mouth daily with supper. Qty: 30 tablet, Refills: 12     !! - Potential duplicate medications found. Please discuss with provider.    CONTINUE these medications which have CHANGED   Details  doxazosin (CARDURA) 1 MG tablet Take 1 tablet (1 mg total) by mouth 2 (two) times daily. Qty: 60 tablet, Refills: 1      CONTINUE these medications which have NOT CHANGED   Details  amLODipine (NORVASC) 10 MG tablet TAKE ONE TABLET BY MOUTH ONCE DAILY IN THE MORNING Qty: 30 tablet, Refills: 2    Cholecalciferol (VITAMIN D3 PO) Take 1 capsule by mouth daily.     Cyanocobalamin (VITAMIN B-12 PO) Take 1 tablet by mouth daily.    esomeprazole (NEXIUM) 20 MG capsule Take 1 capsule (20 mg total) by mouth daily at 12 noon. Qty: 30 capsule, Refills: 3    furosemide (LASIX) 20 MG tablet Take 20 mg by mouth daily.     gabapentin (NEURONTIN) 300 MG capsule Take 300-600 mg by mouth See admin instructions. Take 300 mg by mouth in the morning and take 600 mg by mouth at bedtime    insulin glargine (LANTUS) 100 UNIT/ML injection Inject 14 Units into the skin every morning.     levothyroxine (SYNTHROID, LEVOTHROID) 50 MCG tablet Take 50 mcg by mouth daily before breakfast.     Multiple Vitamins-Minerals (PRESERVISION AREDS 2 PO) Take 1 tablet by mouth daily.    Polyethyl Glycol-Propyl Glycol (SYSTANE OP) Place 1 drop into both eyes 2 (two) times daily.     sitaGLIPtin-metformin (JANUMET) 50-1000 MG per tablet Take 1 tablet by mouth 2 (two) times daily with a meal.    vitamin C (ASCORBIC ACID) 500 MG tablet Take 500 mg by mouth daily.    VITAMIN E PO Take 1 capsule by mouth daily.    meclizine (ANTIVERT) 25 MG tablet Take 25 mg by mouth daily as needed for dizziness. For dizziness         Discharge Condition: Stable   Discharge Instructions Get Medicines reviewed and adjusted: Please take all your  medications with you for your next visit with your Primary MD  Please request your Primary MD to go over all hospital tests and procedure/radiological results at the follow up, please ask your Primary MD to get all Hospital records sent to his/her office.  If you experience worsening of your admission symptoms, develop shortness of breath, life threatening emergency, suicidal or homicidal thoughts you must seek medical attention immediately by calling 911 or calling your MD immediately if symptoms less severe.  You must read complete instructions/literature along with all the possible adverse reactions/side effects for all the Medicines you take and that  have been prescribed to you. Take any new Medicines after you have completely understood and accpet all the possible adverse reactions/side effects.   Do not drive when taking Pain medications.   Do not take more than prescribed Pain, Sleep and Anxiety Medications  Special Instructions: If you have smoked or chewed Tobacco in the last 2 yrs please stop smoking, stop any regular Alcohol and or any Recreational drug use.  Wear Seat belts while driving.  Please note  You were cared for by a hospitalist during your hospital stay. Once you are discharged, your primary care physician will handle any further medical issues. Please note that NO REFILLS for any discharge medications will be authorized once you are discharged, as it is imperative that you return to your primary care physician (or establish a relationship with a primary care physician if you do not have one) for your aftercare needs so that they can reassess your need for medications and monitor your lab values.  Discharge Instructions    Diet - low sodium heart healthy    Complete by:  As directed    Increase activity slowly    Complete by:  As directed        Allergies  Allergen Reactions  . Clonidine Derivatives Other (See Comments)    Bradycardia and fatigue   . Crestor [Rosuvastatin Calcium] Other (See Comments)    Other reaction(s): tired and weak  . Epinephrine Other (See Comments)    Abnormal feeling. Dental exam/injection of local w/ epi.  Marland Kitchen Hydralazine Other (See Comments)    Nausea/gi upset   . Losartan Other (See Comments)    Hyperkalemia   . Other Other (See Comments)    MANGO'S - WHELPS ALL OVER  . Sulfa Antibiotics Other (See Comments)    Unknown      Disposition: 01-Home or Self Care   Consults: * Cardiology    Significant Diagnostic Studies:  Dg Chest 2 View  Result Date: 08/13/2016 CLINICAL DATA:  Status post pacer placement EXAM: CHEST  2 VIEW COMPARISON:  08/08/2016 FINDINGS:  Cardiac shadow is mildly enlarged. Postsurgical changes are again seen. A pacing device is now noted in satisfactory position. The lungs are clear. No pneumothorax is noted. No bony abnormality is seen. IMPRESSION: No pneumothorax following pacemaker placement Electronically Signed   By: Inez Catalina M.D.   On: 08/13/2016 08:01   Dg Chest 2 View  Result Date: 08/08/2016 CLINICAL DATA:  Shortness of breath. EXAM: CHEST  2 VIEW COMPARISON:  Two-view chest x-ray 05/23/2016. FINDINGS: The heart is enlarged. There is no edema or effusion to suggest failure. Atherosclerotic changes are present at the aortic arch. No focal airspace disease is present. The visualized soft tissues and bony thorax are unremarkable. IMPRESSION: Stable cardiomegaly without failure. Electronically Signed   By: San Morelle M.D.   On: 08/08/2016 16:41   TEE  LV  EF: 55% -   60%  ------------------------------------------------------------------- Indications:      Atrial fibrillation - 427.31.  ------------------------------------------------------------------- History:   PMH:  [id number].  Coronary artery disease. Congestive heart failure.  Risk factors:  Hypertension. Diabetes mellitus.  ------------------------------------------------------------------- Study Conclusions  - Left ventricle: Systolic function was normal. The estimated   ejection fraction was in the range of 55% to 60%. Wall motion was   normal; there were no regional wall motion abnormalities. - Aortic valve: No evidence of vegetation. - Mitral valve: No evidence of vegetation. There was mild   regurgitation. - Left atrium: The atrium was mildly dilated. No evidence of   thrombus in the atrial cavity or appendage. - Right atrium: No evidence of thrombus in the atrial cavity or   appendage. - Atrial septum: No defect or patent foramen ovale was identified. - Tricuspid valve: No evidence of vegetation. - Pulmonic valve: No evidence  of vegetation.  Impressions:  - Normal LV systolic function; mild LAE; no LAA thrombus; calcified   subvalvular tricuspid apparatus; mild MR.   Filed Weights   08/12/16 0425 08/13/16 0541 08/14/16 0435  Weight: 60.2 kg (132 lb 12.8 oz) 59.7 kg (131 lb 9.6 oz) 59.5 kg (131 lb 1.6 oz)     Microbiology: Recent Results (from the past 240 hour(s))  MRSA PCR Screening     Status: None   Collection Time: 08/08/16  9:08 PM  Result Value Ref Range Status   MRSA by PCR NEGATIVE NEGATIVE Final    Comment:        The GeneXpert MRSA Assay (FDA approved for NASAL specimens only), is one component of a comprehensive MRSA colonization surveillance program. It is not intended to diagnose MRSA infection nor to guide or monitor treatment for MRSA infections.        Blood Culture    Component Value Date/Time   SDES URINE, RANDOM 05/23/2016 1613   SPECREQUEST NONE 05/23/2016 1613   CULT MULTIPLE SPECIES PRESENT, SUGGEST RECOLLECTION (A) 05/23/2016 1613   REPTSTATUS 05/24/2016 FINAL 05/23/2016 1613      Labs: Results for orders placed or performed during the hospital encounter of 08/08/16 (from the past 48 hour(s))  Glucose, capillary     Status: Abnormal   Collection Time: 08/12/16  3:07 PM  Result Value Ref Range   Glucose-Capillary 121 (H) 65 - 99 mg/dL  Glucose, capillary     Status: Abnormal   Collection Time: 08/12/16  4:40 PM  Result Value Ref Range   Glucose-Capillary 118 (H) 65 - 99 mg/dL  CBC     Status: Abnormal   Collection Time: 08/13/16  5:08 AM  Result Value Ref Range   WBC 7.7 4.0 - 10.5 K/uL   RBC 3.26 (L) 3.87 - 5.11 MIL/uL   Hemoglobin 9.8 (L) 12.0 - 15.0 g/dL   HCT 29.8 (L) 36.0 - 46.0 %   MCV 91.4 78.0 - 100.0 fL   MCH 30.1 26.0 - 34.0 pg   MCHC 32.9 30.0 - 36.0 g/dL   RDW 13.2 11.5 - 15.5 %   Platelets 167 150 - 400 K/uL  Basic metabolic panel     Status: Abnormal   Collection Time: 08/13/16  5:08 AM  Result Value Ref Range   Sodium 139 135 -  145 mmol/L   Potassium 4.1 3.5 - 5.1 mmol/L   Chloride 109 101 - 111 mmol/L   CO2 24 22 - 32 mmol/L   Glucose, Bld 76 65 - 99 mg/dL   BUN  21 (H) 6 - 20 mg/dL   Creatinine, Ser 1.55 (H) 0.44 - 1.00 mg/dL   Calcium 8.3 (L) 8.9 - 10.3 mg/dL   GFR calc non Af Amer 31 (L) >60 mL/min   GFR calc Af Amer 36 (L) >60 mL/min    Comment: (NOTE) The eGFR has been calculated using the CKD EPI equation. This calculation has not been validated in all clinical situations. eGFR's persistently <60 mL/min signify possible Chronic Kidney Disease.    Anion gap 6 5 - 15  Glucose, capillary     Status: None   Collection Time: 08/13/16  8:38 AM  Result Value Ref Range   Glucose-Capillary 84 65 - 99 mg/dL  Glucose, capillary     Status: Abnormal   Collection Time: 08/13/16 12:15 PM  Result Value Ref Range   Glucose-Capillary 129 (H) 65 - 99 mg/dL  Glucose, capillary     Status: Abnormal   Collection Time: 08/13/16  5:16 PM  Result Value Ref Range   Glucose-Capillary 137 (H) 65 - 99 mg/dL   Comment 1 Notify RN    Comment 2 Document in Chart   Glucose, capillary     Status: Abnormal   Collection Time: 08/13/16  8:26 PM  Result Value Ref Range   Glucose-Capillary 200 (H) 65 - 99 mg/dL   Comment 1 Notify RN   Comprehensive metabolic panel     Status: Abnormal   Collection Time: 08/14/16  4:47 AM  Result Value Ref Range   Sodium 139 135 - 145 mmol/L   Potassium 4.0 3.5 - 5.1 mmol/L   Chloride 107 101 - 111 mmol/L   CO2 25 22 - 32 mmol/L   Glucose, Bld 113 (H) 65 - 99 mg/dL   BUN 20 6 - 20 mg/dL   Creatinine, Ser 1.44 (H) 0.44 - 1.00 mg/dL   Calcium 8.3 (L) 8.9 - 10.3 mg/dL   Total Protein 5.6 (L) 6.5 - 8.1 g/dL   Albumin 2.7 (L) 3.5 - 5.0 g/dL   AST 20 15 - 41 U/L   ALT 7 (L) 14 - 54 U/L   Alkaline Phosphatase 97 38 - 126 U/L   Total Bilirubin 0.5 0.3 - 1.2 mg/dL   GFR calc non Af Amer 34 (L) >60 mL/min   GFR calc Af Amer 40 (L) >60 mL/min    Comment: (NOTE) The eGFR has been calculated  using the CKD EPI equation. This calculation has not been validated in all clinical situations. eGFR's persistently <60 mL/min signify possible Chronic Kidney Disease.    Anion gap 7 5 - 15  CBC     Status: Abnormal   Collection Time: 08/14/16  4:47 AM  Result Value Ref Range   WBC 8.1 4.0 - 10.5 K/uL   RBC 3.08 (L) 3.87 - 5.11 MIL/uL   Hemoglobin 9.5 (L) 12.0 - 15.0 g/dL   HCT 27.8 (L) 36.0 - 46.0 %   MCV 90.3 78.0 - 100.0 fL   MCH 30.8 26.0 - 34.0 pg   MCHC 34.2 30.0 - 36.0 g/dL   RDW 13.1 11.5 - 15.5 %   Platelets 170 150 - 400 K/uL  Glucose, capillary     Status: Abnormal   Collection Time: 08/14/16  8:28 AM  Result Value Ref Range   Glucose-Capillary 163 (H) 65 - 99 mg/dL     Lipid Panel     Component Value Date/Time   CHOL 330 (H) 08/08/2016 2130   TRIG 313 (H) 08/08/2016 2130  HDL 42 08/08/2016 2130   CHOLHDL 7.9 08/08/2016 2130   VLDL 63 (H) 08/08/2016 2130   LDLCALC 225 (H) 08/08/2016 2130     Lab Results  Component Value Date   HGBA1C 6.4 (H) 08/08/2016   HGBA1C 6.5 (H) 05/23/2016   HGBA1C 7.3 (H) 12/03/2011        HPI :  [name] is a 75 y.o. female with history of CAD (s/p CABG in 2013 with post-op PAF), chronic diastolic CHF, multiple medication intolerances, baseline bradycardia (not on BB due to this), hypertensive heart disease, DM c/b neuropathy, HTN, hyperlipidemia, CKD stage III, depression, hypothyroidism, esophageal stricture, GERD, very remote stomach ulcers, carotid artery disease (40-59% bilaterally 10/2015), mild cognitive impairment (followed by neurology) who presented to [facility] with tachycardia.   She was admitted over July 2017 with weakness, diaphoresis, hypertensive urgency, and shortness of breath. VQ scan ruled out PE. 2D echo 05/25/16: EF 60-65%, grade 1 DD, AV sclerosis without stenosis. Was discharged with OP nuc 06/03/16 which was low risk with normal perfusion & EF (with horizontal ST depression of 104m noted during Lexiscan in  I, II, V5 and V6 leads). More recently she has recently been followed in clinic due to high blood pressure. She was previously well-controlled on combination of amlodipine and losartan, but ARB had to be discontinued due to hyperkalemia. She was placed on hydralazine and Lasix but felt poorly on hydralazine ("Shaking from the inside out") so hydralazine was stopped and she was started on clonidine. This improved BP,however she noted fatigue, dizziness, and heart rates dropping into the 40s so her clonidine was reduced to 0.1 mg twice a day. She was seen in hypertension clinic on September 14 at which time she was reporting she was only taking 1/2 tablet of amlodipine due to lethargy so was advised to take 1/2 tab BID. She continued to have significant fatigue and malaise with borderline low HR so [place] 08[place]16 she requested to come off of clonidine. It was tapered down in lieu of starting low dose doxazosin with plans to increase if tolerated. Subsequent HRs have been in the 50s-60s per her report after being off clonidine for a few days.  She follows her BP and pulse rate regularly and yesterday noticed her pulse rate was much more elevated than usual. She felt jittery and felt her heart pounding so came to the ER where she was found to be in   atrial flutter. She says this feeling has happened in the past as well. She denies any chest pain but says she's felt somewhat SOB on exertion ever since the summer time. She received 139mIV diltiazem and is now on a drip at 1535mr with minimal effect on HR. She denies any acute symptoms other than feeling "weak." BP was initially quite high up to 211/76, now down to 130/82. Labs notable for Hgb 11.5 (variable from 9-12 over 2017), LDL 225, troponin peak 0.07, Cr 1.22 (baseline appears 1.4-1.6), K 4.3. Denies any recent bleeding issues.  HOSPITAL COURSE:    Atrial fibrillation with RVR / Atrial flutter  - CHADS2VASC is 6 TEE/DCCV 10/18 with recurrent atrial  flutter overnight. She had junctional rhythm following cardioversion  Started amiodarone for rhythm control. Would continue as outpatient with 400 mg BID for 2 weeks then 200 mg daily after that. CHADS2VASC is 6. Continue Xarelto(63m30mily with CrCl), patient needs prior authorization from cardiology to continue. She [facility] day [facility] [place] [facility]  -  TSH 2.3.  - Continue xarelto  Patient needs to follow-up with  device clinic in 10 days.  Accelerated hypertension - Continue Doxazosin increase Norvasc to 10 mg a day    dose of doxazosin increased due to uncontrolled blood pressure    Sinus bradycardia Dual chamber pacemaker placed without issues,Plan for follow up in device clinic in 10 days.   Troponin elevation - Likely demand ischemia from combination of A. fib, a flutter and accelerated hypertension - TEE done 08/11/2016, ejection fraction 55% - No reports of chest pain  Type 2 diabetes mellitus with diabetic nephropathy with long-term insulin use - Hold Janumet - Patient is currently on Lantus 14 units daily and sliding scale insulin - Continue gabapentin for neuropathy  CAD, native artery without angina pectoris  - H/o CABG in 2013. - Sarted on aspirin   dyslipidemia associated with type 2 diabetes mellitus - Not apparently taking a statin., adverse effect from crestor.  - LDL goal should be <70 - Patients wants to follow up with PCP to discuss  further therapy   CKD, stage 3  - Baseline creatinine around 1.5  - Creatinine on this admission with an baseline values   Anemia of chronic kidney disease - Hemoglobin stable at 10.2     Discharge Exam:   Blood pressure (!) 176/50, pulse 69, temperature 99 F (37.2 C), temperature source Oral, resp. rate 14, height 5' (1.524 m), weight 59.5 kg (131 lb 1.6 oz), SpO2 98 %.   General exam: Appears calm and comfortable  Respiratory system: Clear to auscultation. Respiratory effort  normal. Cardiovascular system: S1 & S2 heard, tachycardic (+) Gastrointestinal system: Abdomen is nondistended, soft and nontender. No organomegaly or masses felt. Normal bowel sounds heard. Central nervous system: Alert and oriented. No focal neurological deficits. Extremities: Symmetric 5 x 5 power. Skin: No rashes, lesions or ulcers Psychiatry: Judgement and insight appear normal. Mood & affect appropriate.    Follow-up Information    Will Meredith Leeds, MD .   Specialty:  Cardiology Why:  The office will call you to make an appointment for a 10 day wound check and office visit with Dr Curt Bears in a few months Contact information: [address] [place] [address] [phone]           Signed: Reyne Dumas 08/14/2016, 11:28 AM        Time spent >45 mins

## 2016-08-14 NOTE — Progress Notes (Signed)
Physical Therapy Treatment Patient Details Name: [name] MRN: [id number] DOB: [birth year] Today's Date: 08/14/2016    History of Present Illness 75 y.o. female with medical history significant for CAD (s/p CABG in 2013), DM, HTN, diastolic CHF, CKD, and hypothyroidism who presented to [facility] with A. fib with RVR, started on Xarelto 08/10/2016 and underwent TEE and cardioversion 08/11/2016. dual chamber pacemaker placed 10/19    PT Comments    Patient making improvement with mobility and gait.  Continues to require assist for safety.  Patient unsure of availability of 24 hour assist.  Recommend SNF unless 24 hour assist available at d/c.  Follow Up Recommendations  SNF;Supervision/Assistance - 24 hour (If family can provide 24 hour assist, home with HHPT)     Equipment Recommendations  None recommended by PT    Recommendations for Other Services OT consult     Precautions / Restrictions Precautions Precautions: ICD/Pacemaker Precaution Comments: Patient able to state precautions related to PPM/LUE Restrictions Weight Bearing Restrictions: No    Mobility  Bed Mobility Overal bed mobility: Needs Assistance Bed Mobility: Sit to Supine       Sit to supine: Min guard   General bed mobility comments: Patient able to move to supine with no physical assist.  Able to minimize use of LUE.  Transfers Overall transfer level: Needs assistance Equipment used: Rolling walker (2 wheeled) Transfers: Sit to/from Stand Sit to Stand: Supervision         General transfer comment: Patient using proper hand placement.  Assist for safety.  Ambulation/Gait Ambulation/Gait assistance: Min guard Ambulation Distance (Feet): 200 Feet Assistive device: Rolling walker (2 wheeled);None Gait Pattern/deviations: Step-through pattern;Decreased stride length;Drifts right/left Gait velocity: decreased Gait velocity interpretation: Below normal speed for age/gender General Gait  Details: Required cueing for safe use of RW, especially during turns.  Patient easily distracted by things/people in hallway, and looking into rooms, causing patient to drift left/right.  No loss of balance with RW.   Stairs            Wheelchair Mobility    Modified Rankin (Stroke Patients Only)       Balance           Standing balance support: No upper extremity supported Standing balance-Leahy Scale: Fair               High level balance activites: Turns;Head turns High Level Balance Comments: Drifts to both sides, away from head turn.  Requires assist with RW during turns.    Cognition Arousal/Alertness: Awake/alert Behavior During Therapy: WFL for tasks assessed/performed Overall Cognitive Status: No family/caregiver present to determine baseline cognitive functioning (Decreased safety awareness)                      Exercises      General Comments        Pertinent Vitals/Pain Pain Assessment: No/denies pain    Home Living                      Prior Function            PT Goals (current goals can now be found in the care plan section) Acute Rehab PT Goals Patient Stated Goal: To go home Progress towards PT goals: Progressing toward goals    Frequency    Min 3X/week      PT Plan Current plan remains appropriate    Co-evaluation  End of Session Equipment Utilized During Treatment: Gait belt Activity Tolerance: Patient tolerated treatment well Patient left: in bed;with call bell/phone within reach     Time: 5749-3552 PT Time Calculation (min) (ACUTE ONLY): 15 min  Charges:  $Gait Training: 8-22 mins                    G Codes:      Despina Pole 2016/09/05, 11:08 AM Carita Pian. Sanjuana Kava, [place] Pager [phone]

## 2016-08-14 NOTE — Progress Notes (Signed)
Discharge instructions discussed with pt and daughter. Post ppm implant instructions, restrictions and wound care discussed with pt and daughter. Prescriptions sent to pt's pharmacy. Verbalized understanding. No questions or concerns expressed.

## 2016-08-14 NOTE — Care Management Note (Signed)
Case Management Note  Patient Details  Name: [name] MRN: [id number] Date of Birth: 10/05/1941  Subjective/Objective:                  Atrial flutter with rapid ventricular response [facility]) Action/Plan: Discharge planning Expected Discharge Date:  08/16/16               Expected Discharge Plan:  [place]  In-House Referral:     Discharge planning Services  CM Consult, Medication Assistance  Post Acute Care Choice:  Home Health Choice offered to:  Patient  DME Arranged:  N/A DME Agency:  NA  HH Arranged:  PT, RN [place] Agency:  [place]  Status of Service:  Completed, signed off  If discussed at [place] of Stay Meetings, dates discussed:    Additional Comments: CM met with pt in room to offer choice of home health agency.  Pt chooses [facility] to render HHPT/RN.  Referral called to [facility] rep, Jermaine.  Pt states previous CM gave her (and in turn she gave to daughter) free 30 day trial card and asst forms for Xarelto.  Pt states she has a rollator and a rolling walker at home and does not need a 3n1.  No other CM needs were communicated. Dellie Catholic, RN 08/14/2016, 12:44 PM

## 2016-08-16 ENCOUNTER — Telehealth: Payer: Self-pay | Admitting: Internal Medicine

## 2016-08-16 NOTE — Telephone Encounter (Signed)
New message   Pt daughter calling for instructions and follow up time after procedure

## 2016-08-16 NOTE — Telephone Encounter (Signed)
Patient is supposed to have an appt to follow up w Dr. Curt Bears ~10 days after her pacemaker placement. Just discharged. [name], daughter/DPR will need to receive call as she schedules and transports for patient's appts.

## 2016-08-16 NOTE — Telephone Encounter (Signed)
Will forward to EP scheduler to call and get pt scheduled to see Dr. Curt Bears.

## 2016-08-23 ENCOUNTER — Ambulatory Visit: Payer: Medicare Other

## 2016-08-25 ENCOUNTER — Ambulatory Visit (INDEPENDENT_AMBULATORY_CARE_PROVIDER_SITE_OTHER): Payer: Medicare Other | Admitting: *Deleted

## 2016-08-25 DIAGNOSIS — Z950 Presence of cardiac pacemaker: Secondary | ICD-10-CM | POA: Diagnosis not present

## 2016-08-25 LAB — CUP PACEART INCLINIC DEVICE CHECK
Battery Remaining Longevity: 94 mo
Battery Voltage: 3.13 V
Brady Statistic RA Percent Paced: 81 %
Brady Statistic RV Percent Paced: 0.73 %
Date Time Interrogation Session: 20171101181912
Implantable Lead Implant Date: 20171019
Implantable Lead Implant Date: 20171019
Implantable Lead Location: 753859
Implantable Lead Location: 753860
Implantable Pulse Generator Implant Date: 20171019
Lead Channel Pacing Threshold Amplitude: 0.75 V
Lead Channel Pacing Threshold Amplitude: 1.25 V
Lead Channel Pacing Threshold Pulse Width: 0.4 ms
Lead Channel Pacing Threshold Pulse Width: 0.4 ms
Lead Channel Sensing Intrinsic Amplitude: 12 mV
Lead Channel Setting Pacing Amplitude: 3.5 V
Lead Channel Setting Pacing Amplitude: 3.5 V
Lead Channel Setting Pacing Pulse Width: 0.4 ms
Lead Channel Setting Sensing Sensitivity: 2 mV
Pulse Gen Model: 2272
Pulse Gen Serial Number: 3180458

## 2016-08-25 NOTE — Progress Notes (Signed)
Wound check appointment. Steri-strips removed. Wound without redness, some edema noted, soft to palpation, healing ecchymosis present. Incision edges approximated, wound well healed. Normal device function. Thresholds, sensing, and impedances consistent with implant measurements. Device programmed at 3.5V for extra safety margin until 3 month visit. Histogram distribution appropriate for patient and level of activity. No mode switches or high ventricular rates noted. Patient educated about wound care, arm mobility, lifting restrictions. ROV with WC on 11/23/16.  Patient notes tremors that have increased since d/c from hospital. Started on amiodarone while in hospital. Reviewed with JA, recommended switching to 200mg  QD dose sooner than 11/5, patient and daughter agreeable. Patient scheduled for f/u with Dr. Debara Pickett on 09/22/16.  Patient's daughter also concerned about B/Ps in the 160s-200s/50s, despite amlodipine and doxazosin.  Message sent to Dr. Debara Pickett and his nurse for review.  Patient's daughter is aware to call the office if she does not hear anything back in the next few days.

## 2016-08-30 ENCOUNTER — Telehealth: Payer: Self-pay | Admitting: *Deleted

## 2016-08-30 NOTE — Telephone Encounter (Signed)
-----   Message from [name], MD sent at 08/30/2016  9:47 AM EST ----- Regarding: RE: b/p issues [name] - I'm currently on paternity leave. Her bp has been a real struggle. If it is not well-controlled, would recommend an earlier appointment with a midlevel on our care team.  Dr. Lemmie Evens  ----- Message ----- From: Mechele Dawley, RN Sent: 08/25/2016   6:09 PM To: [name], MD, Cristopher Estimable, RN Subject: b/p issues                                     Dr. Debara Pickett--  I saw Ms. Rish in [facility] for a pacemaker wound check today.  Her daughter reports B/Ps in the 160s-200s/50s since hospital d/c, despite amlodipine 10mg  daily and doxazosin 1mg  BID.  Patient is scheduled to see you on 11/29.  Her daughter just wanted to make sure that you didn't recommend sooner f/u for B/P.  Thank you! [name]

## 2016-08-30 NOTE — Telephone Encounter (Signed)
Spoke with pt, she is going to have her daughter call me to discuss a follow up appointment.

## 2016-08-31 ENCOUNTER — Ambulatory Visit (INDEPENDENT_AMBULATORY_CARE_PROVIDER_SITE_OTHER): Payer: Medicare Other | Admitting: Nurse Practitioner

## 2016-08-31 ENCOUNTER — Encounter: Payer: Self-pay | Admitting: Nurse Practitioner

## 2016-08-31 VITALS — BP 178/60 | HR 60 | Ht 60.0 in | Wt 133.0 lb

## 2016-08-31 DIAGNOSIS — I5032 Chronic diastolic (congestive) heart failure: Secondary | ICD-10-CM

## 2016-08-31 DIAGNOSIS — I110 Hypertensive heart disease with heart failure: Secondary | ICD-10-CM | POA: Diagnosis not present

## 2016-08-31 DIAGNOSIS — I2510 Atherosclerotic heart disease of native coronary artery without angina pectoris: Secondary | ICD-10-CM

## 2016-08-31 DIAGNOSIS — I4892 Unspecified atrial flutter: Secondary | ICD-10-CM

## 2016-08-31 MED ORDER — DOXAZOSIN MESYLATE 4 MG PO TABS: 4.0000 mg | ORAL_TABLET | Freq: Every day | ORAL | 6 refills | Status: DC

## 2016-08-31 NOTE — Patient Instructions (Addendum)
Medication Instructions:  INCREASE Cardura (Doxazosin) from 1mg  to 4mg  (ok to take TWO 1mg  tabs twice a day until patient finishes the 1mg  tablet)   Labwork: None     Testing/Procedures: None   Follow-Up: Your physician recommends that you schedule a follow-up appointment in: [place]   Any Other Special Instructions Will Be Listed Below (If Applicable).     If you need a refill on your cardiac medications before your next appointment, please call your pharmacy.

## 2016-08-31 NOTE — Telephone Encounter (Signed)
Follow up scheduled

## 2016-08-31 NOTE — Progress Notes (Signed)
Office Visit    Patient Name: [name] Date of Encounter: 08/31/2016  Primary Care Provider:  Leeroy Cha, MD Primary Cardiologist:  C. Hilty, MD   Chief Complaint    75 y/o ?  with history of coronary disease, hypertension, hyperlipidemia, diabetes, diastolic heart failure, and recent admission for aflutter and tachy brady syndrome, who presents for follow-up.  Past Medical History    Past Medical History:  Diagnosis Date  . Arthritis    "in my hands"  . Carotid artery disease ([place])    a. 40-59% bilaterally 10/2015.  Marland Kitchen Chronic diastolic CHF (congestive heart failure) ([place])    a. 05/2016 Echo: EF 60-65%, no rwma, Gr1 DD, Ao sclerosis w/o stenosis, triv MR;  b. 07/2016 TEE: EF 55-60%, no rwma, mild MR.  . CKD (chronic kidney disease), stage III   . Coronary artery disease    a. 02/2007 Persantine MV: low risk;  b. 11/2011 CABG x 3 (LIMA->LAD, VG->OM, VG->RCA);  c. 05/2016 MV: EF >65%, no isch/infarct, horiz ST dep in I, II, V5-V6.  Marland Kitchen Depression   . Diabetes mellitus   . Diverticulosis   . Esophageal stricture   . GERD (gastroesophageal reflux disease)   . Hemorrhoids   . Hiatal hernia   . Hyperkalemia    a. ARB stopped due to this.  . Hyperlipidemia   . Hypertensive heart disease   . Hypothyroidism   . Mild cognitive impairment    a. seen by neurology.  Marland Kitchen PAF (paroxysmal atrial fibrillation) ([facility])    a. post-op CABG.  . Pain    RIGHT KNEE PAIN - TORN RIGHT MEDIAL MENISCUS  . Paroxysmal atrial flutter ([place])    a. 07/2016 s/p TEE & DCCV;  b. 07/2016 Recurrent PAFlutter req initiation of amio & PPM in setting of tachy-brady;  c. CHA2DS2VASc = 7-->Xarelto 15 mg QD.  Marland Kitchen PONV (postoperative nausea and vomiting)   . S/P CABG (coronary artery bypass graft), 12/04/11 12/07/2011   LIMA to LAD, SVG to OM, SVG to RCA  . Sinus bradycardia    a. not on BB due to this.  . Tachy-brady syndrome ([place])    a. 07/2016 Jxnl brady following DCCV, recurrent Aflutter-->amio +  SJM 2272 Assurity MRI DC PPM (ser # [id number]).   Past Surgical History:  Procedure Laterality Date  . ABDOMINAL HYSTERECTOMY  1980's  . BACK SURGERY  2006   "cyst growing near my spine"  . CARDIAC CATHETERIZATION  12/02/2011   mild LV dysfunction with mod hypocontractility of mid-distal anterolateral wall; CAD w/ostial tapering of L Main with 50% diffuse ostial narrowing of LAD, 99% eccentric focal prox LAD stenosis followed by 70% prox LAD stenosis after 1st diag, 20% mid LAD narrowing; 80% ostial-to-prox L Cfx stenosis & 40-50% irregularity of RCA (Dr. Corky Downs)  . CARDIOVERSION N/A 08/11/2016   Procedure: CARDIOVERSION;  Surgeon: Lelon Perla, MD;  Location: [facility];  Service: Cardiovascular;  Laterality: N/A;  . CATARACT EXTRACTION W/ INTRAOCULAR LENS  IMPLANT, BILATERAL  ~ 2010  . [place]  . CORONARY ARTERY BYPASS GRAFT  12/04/2011   Procedure: CORONARY ARTERY BYPASS GRAFTING (CABG);  Surgeon: Tharon Aquas Adelene Idler, MD;  Location: [facility];  Service: Open Heart Surgery;  Laterality: N/A;  CABG x three,  using left internal mammary artery, and right leg greater saphenous vein harvested endoscopically  . DILATION AND CURETTAGE OF UTERUS     "a couple times"  . EP IMPLANTABLE DEVICE N/A 08/12/2016  Procedure: Pacemaker Implant;  Surgeon: Will Meredith Leeds, MD;  Location: [facility];  Service: Cardiovascular;  Laterality: N/A;  . FRACTURE SURGERY     "put pins both side right ankle"  . JOINT REPLACEMENT    . KNEE ARTHROSCOPY WITH MEDIAL MENISECTOMY Right 07/02/2014   Procedure: RIGHT KNEE ARTHROSCOPY WITH PARTIAL MEDIAL MENISTECTOMY, ABRASION CONDROPLASTYU OF PATELLA,ABRASION CONDROPLASTY OF MEDIAL FEMEROL CONDYL, MICROFRACTURE OF MEDIAL FEMEROL CONDYL;  Surgeon: Tobi Bastos, MD;  Location: [facility];  Service: Orthopedics;  Laterality: Right;  . LEFT HEART CATHETERIZATION WITH CORONARY ANGIOGRAM N/A 12/02/2011   Procedure: LEFT HEART CATHETERIZATION WITH CORONARY  ANGIOGRAM;  Surgeon: Troy Sine, MD;  [facility];  Service: Cardiovascular;  Laterality: N/A;  Coronary angiogram, possible PCI  . TEE WITHOUT CARDIOVERSION N/A 08/11/2016   Procedure: TRANSESOPHAGEAL ECHOCARDIOGRAM (TEE);  Surgeon: Lelon Perla, MD;  Location: [facility];  Service: Cardiovascular;  Laterality: N/A;  . TONSILLECTOMY  1949  . TOTAL KNEE ARTHROPLASTY  ~ 2006   left  . TRANSTHORACIC ECHOCARDIOGRAM  02/19/2013   EF 70-26%, grade 1 diastolic dysfunction; mildly thickend/calcified AV leaflets; mildly calcidied MV annulus; mild TR    Allergies  Allergies  Allergen Reactions  . Clonidine Derivatives Other (See Comments)    Bradycardia and fatigue   . Crestor [Rosuvastatin Calcium] Other (See Comments)    Other reaction(s): tired and weak  . Epinephrine Other (See Comments)    Abnormal feeling. Dental exam/injection of local w/ epi.  Marland Kitchen Hydralazine Other (See Comments)    Nausea/gi upset   . Losartan Other (See Comments)    Hyperkalemia   . Other Other (See Comments)    MANGO'S - WHELPS ALL OVER  . Sulfa Antibiotics Other (See Comments)    Unknown    History of Present Illness    75 year old female with the above complex past medical history including a prior history of CAD status post CABG 3 in February 2013. Other history includes hypertension, hyperlipidemia, diabetes, and stage III kidney disease.  Over the past few months, she has been evaluated 2/2 marked HTN in the setting of intolerance to multiple meds, along with bradycardia while on clonidine therapy.  Upon her last outpt visit, clonidine was d/c'd and she was placed on low dose doxazosin.  Approximately one week later, she developed a jittering sensation and palpitations. She was evaluated in the [place] and found to have atrial flutter. She was admitted and initially placed on diltiazem without improvement. She subsequently underwent TEE and cardioversion which was initially successful.  Following cardioversion, she had junctional bradycardia limiting the use of AV nodal blocking agents. She then developed recurrent atrial flutter and it was felt that she would require initiation of amiodarone. In that setting, a dual-chamber permanent pacemaker was placed and amiodarone was initiated for management of tachybradycardia syndrome. She was also anticoagulated with xarelto 15 mg daily even her history of chronic kidney disease. Since her discharge, her blood pressures have remained in the 160s to 180s for the most part. She says that she feels weak but is working with physical therapy. She has not been having any chest pain, dyspnea, palpitations, or jittery sensations.  Home Medications    Prior to Admission medications   Medication Sig Start Date End Date Taking? Authorizing Provider  amiodarone (PACERONE) 200 MG tablet Take 1 tablet (200 mg total) by mouth daily. 08/29/16  Yes Reyne Dumas, MD  amLODipine (NORVASC) 10 MG tablet TAKE ONE TABLET BY MOUTH ONCE  DAILY IN THE MORNING Patient taking differently: Take 10 mg by mouth once daily 05/24/16  Yes [name], MD  aspirin 81 MG chewable tablet Chew 1 tablet (81 mg total) by mouth daily. 08/14/16  Yes Reyne Dumas, MD  Cholecalciferol (VITAMIN D3 PO) Take 1 capsule by mouth daily.   Yes Historical Provider, MD  Cyanocobalamin (VITAMIN B-12 PO) Take 1 tablet by mouth daily.   Yes Historical Provider, MD  doxazosin (CARDURA) 4 MG tablet Take 1 tablet (4 mg total) by mouth daily. 08/31/16  Yes Rogelia Mire, NP  esomeprazole (NEXIUM) 20 MG capsule Take 1 capsule (20 mg total) by mouth daily at 12 noon. 04/13/16  Yes Irene Shipper, MD  furosemide (LASIX) 20 MG tablet Take 20 mg by mouth daily.    Yes Historical Provider, MD  gabapentin (NEURONTIN) 300 MG capsule Take 300-600 mg by mouth See admin instructions. Take 300 mg by mouth in the morning and take 600 mg by mouth at bedtime   Yes Historical Provider, MD  insulin glargine  (LANTUS) 100 UNIT/ML injection Inject 14 Units into the skin every morning.    Yes Historical Provider, MD  levothyroxine (SYNTHROID, LEVOTHROID) 50 MCG tablet Take 50 mcg by mouth daily before breakfast.    Yes Historical Provider, MD  meclizine (ANTIVERT) 25 MG tablet Take 25 mg by mouth daily as needed for dizziness. For dizziness   Yes Historical Provider, MD  Multiple Vitamins-Minerals (PRESERVISION AREDS 2 PO) Take 1 tablet by mouth daily.   Yes Historical Provider, MD  Polyethyl Glycol-Propyl Glycol (SYSTANE OP) Place 1 drop into both eyes 2 (two) times daily.    Yes Historical Provider, MD  Rivaroxaban (XARELTO) 15 MG TABS tablet Take 1 tablet (15 mg total) by mouth daily with supper. 08/14/16  Yes Reyne Dumas, MD  sitaGLIPtin-metformin (JANUMET) 50-1000 MG per tablet Take 1 tablet by mouth 2 (two) times daily with a meal.   Yes Historical Provider, MD  vitamin C (ASCORBIC ACID) 500 MG tablet Take 500 mg by mouth daily.   Yes Historical Provider, MD  VITAMIN E PO Take 1 capsule by mouth daily.   Yes Historical Provider, MD    Review of Systems    As above, blood pressures continue to run high at home. She has continued to have some degree of fatigue which is improving with home health PT. She denies chest pain, palpitations, dyspnea, PND, orthopnea, dizziness, syncope, edema, or early satiety.  All other systems reviewed and are otherwise negative except as noted above.  Physical Exam    VS:  BP (!) 178/60   Pulse 60   Ht 5' (1.524 m)   Wt 133 lb (60.3 kg)   SpO2 99%   BMI 25.97 kg/m  , BMI Body mass index is 25.97 kg/m. GEN: Well nourished, well developed, in no acute distress.  HEENT: normal.  Neck: Supple, no JVD, carotid bruits, or masses. Cardiac: RRR, no murmurs, rubs, or gallops. No clubbing, cyanosis, edema.  Radials/DP/PT 2+ and equal bilaterally.  Respiratory:  Respirations regular and unlabored, clear to auscultation bilaterally. GI: Soft, nontender, nondistended, BS  + x 4. MS: no deformity or atrophy. Skin: warm and dry, no rash. Neuro:  Strength and sensation are intact. Psych: Normal affect.  Accessory Clinical Findings    Records from recent hospital is reviewed in detail.  Assessment & Plan    1.  Paroxysmal atrial flutter/tachybradycardia syndrome: Status post recent admission for a new diagnosis of atrial flutter  with subsequent TEE and cardioversion followed by an pacemaker placement and initiation of amiodarone therapy due to alternating junctional bradycardia and recurrent atrial flutter. She is now anticoagulated on xarelto 15 mg daily. She has been tolerating amiodarone and Xarelto well. She will continue follow-up with electrophysiology going forward.  2. Hypertensive heart disease: Blood pressure is elevated today and has been in the 160s to 180s for the most part at home. She is currently on amlodipine 10 mg daily and doxazosin 1 mg twice a day. We discussed options today and given her prior multiple intolerances, at this point, I will plan to increase her doxazosin to 4 mg daily. We can look to titrate this further in the future if necessary. We also discussed how now that she has a pacemaker, she is again a candidate for medications like beta blockers. If we cannot manage her blood pressure with her current medications and further titration of doxazosin, I would plan to carvedilol next.   3. Chronic diastolic congestive heart failure: Volume status is stable. Continue to work on her blood pressure as outlined above.  4. Stage III chronic kidney disease: This was stable during recent hospitalization. She is not on an ACE inhibitor or ARB secondary to hyperkalemia noted during hospitalization in August.  5. Type 2 diabetes mellitus: Management by primary care with oral medications.  6. Disposition: She has follow-up with Dr. Debara Pickett at the end of the month. She will require a CBC at that point in the setting of new Xarelto  therapy.   Murray Hodgkins, NP 08/31/2016, 1:12 PM

## 2016-09-01 ENCOUNTER — Telehealth: Payer: Self-pay | Admitting: Neurology

## 2016-09-02 NOTE — Telephone Encounter (Signed)
error 

## 2016-09-03 ENCOUNTER — Telehealth: Payer: Self-pay | Admitting: *Deleted

## 2016-09-03 NOTE — Telephone Encounter (Signed)
-----   Message from [name], MD sent at 08/30/2016  9:47 AM EST ----- Regarding: RE: b/p issues [name] - I'm currently on paternity leave. Her bp has been a real struggle. If it is not well-controlled, would recommend an earlier appointment with a midlevel on our care team.  Dr. Lemmie Evens  ----- Message ----- From: Mechele Dawley, RN Sent: 08/25/2016   6:09 PM To: [name], MD, Cristopher Estimable, RN Subject: b/p issues                                     Dr. Debara Pickett--  I saw Ms. Coy in [facility] for a pacemaker wound check today.  Her daughter reports B/Ps in the 160s-200s/50s since hospital d/c, despite amlodipine 10mg  daily and doxazosin 1mg  BID.  Patient is scheduled to see you on 11/29.  Her daughter just wanted to make sure that you didn't recommend sooner f/u for B/P.  Thank you! [name]

## 2016-09-03 NOTE — Telephone Encounter (Signed)
Spoke with patient's daughter.  Patient saw Ignacia Bayley, NP on 08/31/16 and he increased her doxazosin to 4mg  daily.  Patient's daughter is appreciative of assistance and denies additional questions or concerns at this time.

## 2016-09-10 ENCOUNTER — Telehealth: Payer: Self-pay | Admitting: Internal Medicine

## 2016-09-10 NOTE — Telephone Encounter (Signed)
New message      Question about xarelto.  Daughter would not tell me what she wanted

## 2016-09-10 NOTE — Telephone Encounter (Signed)
Spoke with pt dtr, prior authorization completed and xarelto approved.

## 2016-09-17 ENCOUNTER — Other Ambulatory Visit: Payer: Self-pay | Admitting: Internal Medicine
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
## 2016-09-20 NOTE — Telephone Encounter (Signed)
Rx has been sent to the pharmacy electronically. ° °

## 2016-09-21 ENCOUNTER — Ambulatory Visit: Payer: Medicare Other | Admitting: Neurology

## 2016-09-22 ENCOUNTER — Encounter: Payer: Self-pay | Admitting: Internal Medicine

## 2016-09-22 ENCOUNTER — Ambulatory Visit (INDEPENDENT_AMBULATORY_CARE_PROVIDER_SITE_OTHER): Payer: Medicare Other | Admitting: Internal Medicine

## 2016-09-22 ENCOUNTER — Other Ambulatory Visit: Payer: Self-pay | Admitting: Internal Medicine

## 2016-09-22 VITALS — BP 186/62 | HR 60 | Ht 60.0 in | Wt 138.4 lb

## 2016-09-22 DIAGNOSIS — Z79899 Other long term (current) drug therapy: Secondary | ICD-10-CM

## 2016-09-22 DIAGNOSIS — I4892 Unspecified atrial flutter: Secondary | ICD-10-CM | POA: Diagnosis not present

## 2016-09-22 DIAGNOSIS — I5032 Chronic diastolic (congestive) heart failure: Secondary | ICD-10-CM | POA: Diagnosis not present

## 2016-09-22 DIAGNOSIS — Z951 Presence of aortocoronary bypass graft: Secondary | ICD-10-CM | POA: Diagnosis not present

## 2016-09-22 DIAGNOSIS — Z950 Presence of cardiac pacemaker: Secondary | ICD-10-CM

## 2016-09-22 LAB — CBC
HCT: 31 % — ABNORMAL LOW (ref 35.0–45.0)
Hemoglobin: 9.9 g/dL — ABNORMAL LOW (ref 11.7–15.5)
MCH: 30.2 pg (ref 27.0–33.0)
MCHC: 31.9 g/dL — ABNORMAL LOW (ref 32.0–36.0)
MCV: 94.5 fL (ref 80.0–100.0)
MPV: 10.4 fL (ref 7.5–12.5)
Platelets: 194 10*3/uL (ref 140–400)
RBC: 3.28 MIL/uL — ABNORMAL LOW (ref 3.80–5.10)
RDW: 15.8 % — ABNORMAL HIGH (ref 11.0–15.0)
WBC: 7.3 10*3/uL (ref 3.8–10.8)

## 2016-09-22 LAB — TSH: TSH: 8.44 mIU/L — ABNORMAL HIGH

## 2016-09-22 LAB — BASIC METABOLIC PANEL
BUN: 30 mg/dL — ABNORMAL HIGH (ref 7–25)
CO2: 22 mmol/L (ref 20–31)
Calcium: 9.1 mg/dL (ref 8.6–10.4)
Chloride: 111 mmol/L — ABNORMAL HIGH (ref 98–110)
Creat: 1.59 mg/dL — ABNORMAL HIGH (ref 0.60–0.93)
Glucose, Bld: 114 mg/dL — ABNORMAL HIGH (ref 65–99)
Potassium: 5.2 mmol/L (ref 3.5–5.3)
Sodium: 143 mmol/L (ref 135–146)

## 2016-09-22 MED ORDER — CARVEDILOL 3.125 MG PO TABS: 3.1250 mg | ORAL_TABLET | Freq: Two times a day (BID) | ORAL | 3 refills | Status: DC

## 2016-09-22 NOTE — Patient Instructions (Signed)
Medication Instructions:  START Coreg 3.125mg  Take 1 tablet by mouth twice a day   Labwork: Your physician recommends that you return for lab work in: [employer], BMET, TSH  Testing/Procedures: None    Follow-Up: Your physician recommends that you schedule a follow-up appointment in: 3 months with Dr Debara Pickett.   Your physician recommends that you schedule a follow-up appointment in: 2-3 weeks with [facility]  Any Other Special Instructions Will Be Listed Below (If Applicable).     If you need a refill on your cardiac medications before your next appointment, please call your pharmacy.

## 2016-09-22 NOTE — Progress Notes (Signed)
OFFICE NOTE  Chief Complaint:  Hospital follow-up  Primary Care Physician: Leeroy Cha, MD  HPI:  [name] is a pleasant 75 year old female with history of coronary disease and CABG x 3 vessels in 2013, LIMA to LAD, SVG to OM and SVG to right coronary. She underwent an echocardiogram which demonstrates normal EF of 55% to 65%. However, there is stage 1 diastolic dysfunction and evidence for elevated mean left atrial filling pressure. There were mildly thickened calcified aortic valve leaflets but no stenosis and mild tricuspid regurgitation. At the time, I started her on Lasix 20 mg daily. A BMP was obtained that day which indicated mild elevation at 75.5, creatinine was 1.16. She has been taking the Lasix with improvement in her lower extremity swelling although shortness of breath seems to be about the same. Again, she notes it is only with marked exertion, not necessarily with normal activities. However, she does have ongoing fatigue and easy fatigability. This may be due to just poor exercise tolerance. She also reported that recently you started her on a new SLGT2 diabetic medicine and she said that she took several doses of that, which I believe was Invokana, and she became markedly dizzy with that. She has since stopped that medication. Again, overall the patient is fairly stable with a normal ejection fraction. She recently had bypass and is not having any anginal symptoms and therefore I do not suspect that this is an issue related to her bypass grafts.   I saw [name] today in the office for an acute visit regarding what is described as abdominal pain. She reports a numbness and pain sensation over the right upper quadrant. She's also noted some firmness and distention of her abdomen. This is recently presented to a GI doctor in [place] who thought she might have a gastric ulcer. He performed endoscopy which was a reportedly unrevealing. He also recommended she is take  MiraLAX for constipation. She says that recently she's been constipated but also has been having some small amount of loose stool. She did not want to take the MiraLAX given the loose stool. She also reports some of the discomfort extends across the mid epigastrium and into the lower chest. It is entirely atypical for cardiac chest pain. She reports that she had a colonoscopy in the past by Dr. Henrene Pastor about 5 years ago and a repeat colonoscopy was recommended around 2018. She also tells me that she recently had a right upper quadrant ultrasound performed at [facility] after presenting with abdominal pain which was negative for acute biliary disease.  [name] returns today for follow-up. Overall she seems to be doing well from a cardiac standpoint. I understand she is switched primary care providers to [facility] primary care, however she is unclear of her primary care provider. She does have an upcoming appointment to see Jackelyn Knife neurology. Recently she's been having trouble with forgetfulness and it seems to be getting worse. She tells me she lost her cell phone at home and has not been able to locate it. She denies any chest pain or shortness of breath and in fact her flank back and abdominal pain which was evaluated extensively has now gone away.  I had the pleasure of seeing [name] back today in follow-up. It's been 6 months and she'll last appointment. Her main concern today is leg weakness. She reports she has some back pain and associated weakness in her legs that give out from time  to time. She recently had memory testing indicating possible dementia as well as likely depression. It was recommended that she go on therapy however she declined medications. She denies any chest pain or worsening shortness of breath.  07/01/2016  [name] returns today for follow-up. She was recently hospitalized for acute diastolic heart failure. She was treated with diuretics and  adjustments are made to her blood pressure medications. She had a repeat echocardiogram which was essentially stable showed normal LV EF and diastolic dysfunction. After discharge she underwent nuclear stress testing which was negative for ischemia. She saw Ignacia Bayley, NP, who made some changes to her blood pressure medications including placing her on clonidine. Since taking this medicine she has had some of the typical symptoms of fatigue and dry mouth as well as constipation. She was alert initially taking clonidine 2 mg twice daily. She then decreased it to one half tablet or 1 mg twice daily but blood pressure remains high. I checked her blood pressure manually today 160/60. She reports fatigue and low energy.  09/22/2016  [name] returns today for hospital follow-up. 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 She had a St. Jude surety MRI safe pacemaker placed. She was also started on amiodarone and underwent cardioversion. She seems to be maintaining sinus rhythm. She reports improvement in her energy. She recently saw Ignacia Bayley, NP, in follow-up who reported she was doing well although noted her blood pressure was more elevated. She was placed on doxazosin 4 mg daily and had initial improvement in blood pressure however blood pressure has since gone up. Today was 186/62. Unfortunately, she's been intolerant of a number of blood pressure medications in the past.  PMHx:  Past Medical History:  Diagnosis Date  . Arthritis    "in my hands"  . Carotid artery disease ([place])    a. 40-59% bilaterally 10/2015.  Marland Kitchen Chronic diastolic CHF (congestive heart failure) ([place])    a. 05/2016 Echo: EF 60-65%, no rwma, Gr1 DD, Ao sclerosis w/o stenosis, triv MR;  b. 07/2016 TEE: EF 55-60%, no rwma, mild MR.  . CKD (chronic kidney disease), stage III   . Coronary artery disease    a. 02/2007 Persantine MV: low risk;  b. 11/2011 CABG x 3  (LIMA->LAD, VG->OM, VG->RCA);  c. 05/2016 MV: EF >65%, no isch/infarct, horiz ST dep in I, II, V5-V6.  Marland Kitchen Depression   . Diabetes mellitus   . Diverticulosis   . Esophageal stricture   . GERD (gastroesophageal reflux disease)   . Hemorrhoids   . Hiatal hernia   . Hyperkalemia    a. ARB stopped due to this.  . Hyperlipidemia   . Hypertensive heart disease   . Hypothyroidism   . Mild cognitive impairment    a. seen by neurology.  Marland Kitchen PAF (paroxysmal atrial fibrillation) (H[facility])    a. post-op CABG.  . Pain    RIGHT KNEE PAIN - TORN RIGHT MEDIAL MENISCUS  . Paroxysmal atrial flutter ([place])    a. 07/2016 s/p TEE & DCCV;  b. 07/2016 Recurrent PAFlutter req initiation of amio & PPM in setting of tachy-brady;  c. CHA2DS2VASc = 7-->Xarelto 15 mg QD.  Marland Kitchen PONV (postoperative nausea and vomiting)   . S/P CABG (coronary artery bypass graft), 12/04/11 12/07/2011   LIMA to LAD, SVG to OM, SVG to RCA  . Sinus bradycardia    a. not on BB due to this.  . Tachy-brady syndrome ([place])  a. 07/2016 Jxnl brady following DCCV, recurrent Aflutter-->amio + SJM 2272 Assurity MRI DC PPM (ser # [id number]).    Past Surgical History:  Procedure Laterality Date  . ABDOMINAL HYSTERECTOMY  1980's  . BACK SURGERY  2006   "cyst growing near my spine"  . CARDIAC CATHETERIZATION  12/02/2011   mild LV dysfunction with mod hypocontractility of mid-distal anterolateral wall; CAD w/ostial tapering of L [place] with 50% diffuse ostial narrowing of LAD, 99% eccentric focal prox LAD stenosis followed by 70% prox LAD stenosis after 1st diag, 20% mid LAD narrowing; 80% ostial-to-prox L Cfx stenosis & 40-50% irregularity of RCA (Dr. Corky Downs)  . CARDIOVERSION N/A 08/11/2016   Procedure: CARDIOVERSION;  Surgeon: Lelon Perla, MD;  Location: [facility];  Service: Cardiovascular;  Laterality: N/A;  . CATARACT EXTRACTION W/ INTRAOCULAR LENS  IMPLANT, BILATERAL  ~ 2010  . [place]  . CORONARY ARTERY BYPASS GRAFT  12/04/2011    Procedure: CORONARY ARTERY BYPASS GRAFTING (CABG);  Surgeon: Tharon Aquas Adelene Idler, MD;  Location: [facility];  Service: Open Heart Surgery;  Laterality: N/A;  CABG x three,  using left internal mammary artery, and right leg greater saphenous vein harvested endoscopically  . DILATION AND CURETTAGE OF UTERUS     "a couple times"  . EP IMPLANTABLE DEVICE N/A 08/12/2016   Procedure: Pacemaker Implant;  Surgeon: Will Meredith Leeds, MD;  Location: [facility];  Service: Cardiovascular;  Laterality: N/A;  . FRACTURE SURGERY     "put pins both side right ankle"  . JOINT REPLACEMENT    . KNEE ARTHROSCOPY WITH MEDIAL MENISECTOMY Right 07/02/2014   Procedure: RIGHT KNEE ARTHROSCOPY WITH PARTIAL MEDIAL MENISTECTOMY, ABRASION CONDROPLASTYU OF PATELLA,ABRASION CONDROPLASTY OF MEDIAL FEMEROL CONDYL, MICROFRACTURE OF MEDIAL FEMEROL CONDYL;  Surgeon: Tobi Bastos, MD;  Location: [facility];  Service: Orthopedics;  Laterality: Right;  . LEFT HEART CATHETERIZATION WITH CORONARY ANGIOGRAM N/A 12/02/2011   Procedure: LEFT HEART CATHETERIZATION WITH CORONARY ANGIOGRAM;  Surgeon: Troy Sine, MD;  Location: [facility] C[facility] LAB;  Service: Cardiovascular;  Laterality: N/A;  Coronary angiogram, possible PCI  . TEE WITHOUT CARDIOVERSION N/A 08/11/2016   Procedure: TRANSESOPHAGEAL ECHOCARDIOGRAM (TEE);  Surgeon: Lelon Perla, MD;  Location: [facility];  Service: Cardiovascular;  Laterality: N/A;  . TONSILLECTOMY  1949  . TOTAL KNEE ARTHROPLASTY  ~ 2006   left  . TRANSTHORACIC ECHOCARDIOGRAM  02/19/2013   EF 75-64%, grade 1 diastolic dysfunction; mildly thickend/calcified AV leaflets; mildly calcidied MV annulus; mild TR    FAMHx:  Family History  Problem Relation Age of Onset  . Diabetes Mother     also, HTN & CVA  . Heart disease Father     also hyperlipidemia  . Breast cancer Sister     x 3  . Heart disease Brother     x5; one with MI  . Heart disease Sister     x3  . Diabetes Sister     x3  . Lung cancer  Sister   . Breast cancer Sister     x2  . Colon cancer Neg Hx     SOCHx:   reports that she has never smoked. She has never used smokeless tobacco. She reports that she drinks alcohol. She reports that she does not use drugs.  ALLERGIES:  Allergies  Allergen Reactions  . Clonidine Derivatives Other (See Comments)    Bradycardia and fatigue   . Crestor [Rosuvastatin Calcium] Other (See Comments)    Other reaction(s):  tired and weak  . Epinephrine Other (See Comments)    Abnormal feeling. Dental exam/injection of local w/ epi.  Marland Kitchen Hydralazine Other (See Comments)    Nausea/gi upset   . Losartan Other (See Comments)    Hyperkalemia   . Other Other (See Comments)    MANGO'S - WHELPS ALL OVER  . Sulfa Antibiotics Other (See Comments)    Unknown    ROS: Pertinent items noted in HPI and remainder of comprehensive ROS otherwise negative.  HOME MEDS: Current Outpatient Prescriptions  Medication Sig Dispense Refill  . amiodarone (PACERONE) 200 MG tablet Take 1 tablet (200 mg total) by mouth daily. 30 tablet 2  . amLODipine (NORVASC) 10 MG tablet TAKE ONE TABLET BY MOUTH ONCE DAILY IN THE MORNING 30 tablet 11  . aspirin 81 MG chewable tablet Chew 1 tablet (81 mg total) by mouth daily. 30 tablet 1  . Cholecalciferol (VITAMIN D3 PO) Take 1 capsule by mouth daily.    . Cyanocobalamin (VITAMIN B-12 PO) Take 1 tablet by mouth daily.    Marland Kitchen doxazosin (CARDURA) 4 MG tablet Take 1 tablet (4 mg total) by mouth daily. 30 tablet 6  . esomeprazole (NEXIUM) 20 MG capsule Take 1 capsule (20 mg total) by mouth daily at 12 noon. 30 capsule 3  . furosemide (LASIX) 20 MG tablet Take 20 mg by mouth daily.     Marland Kitchen gabapentin (NEURONTIN) 300 MG capsule Take 300-600 mg by mouth See admin instructions. Take 300 mg by mouth in the morning and take 600 mg by mouth at bedtime    . insulin glargine (LANTUS) 100 UNIT/ML injection Inject 14 Units into the skin every morning.     Marland Kitchen levothyroxine (SYNTHROID,  LEVOTHROID) 50 MCG tablet Take 50 mcg by mouth daily before breakfast.     . meclizine (ANTIVERT) 25 MG tablet Take 25 mg by mouth daily as needed for dizziness. For dizziness    . Multiple Vitamins-Minerals (PRESERVISION AREDS 2 PO) Take 1 tablet by mouth daily.    Vladimir Faster Glycol-Propyl Glycol (SYSTANE OP) Place 1 drop into both eyes 2 (two) times daily.     . Rivaroxaban (XARELTO) 15 MG TABS tablet Take 1 tablet (15 mg total) by mouth daily with supper. 30 tablet 12  . sitaGLIPtin-metformin (JANUMET) 50-1000 MG per tablet Take 1 tablet by mouth 2 (two) times daily with a meal.    . vitamin C (ASCORBIC ACID) 500 MG tablet Take 500 mg by mouth daily.    Marland Kitchen VITAMIN E PO Take 1 capsule by mouth daily.    . carvedilol (COREG) 3.125 MG tablet Take 1 tablet (3.125 mg total) by mouth 2 (two) times daily. 60 tablet 3   No current facility-administered medications for this visit.     LABS/IMAGING: No results found for this or any previous visit (from the past 48 hour(s)). No results found.  VITALS: BP (!) 186/62   Pulse 60   Ht 5' (1.524 m)   Wt 138 lb 6.4 oz (62.8 kg)   SpO2 98%   BMI 27.03 kg/m   EXAM: General appearance: alert and no distress Neck: no carotid bruit and no JVD Lungs: clear to auscultation bilaterally Heart: regular rate and rhythm, S1, S2 normal, no murmur, click, rub or gallop Abdomen: Protuberant, distended with mild to be need to percussion, no rebound and mild guarding in the right upper quadrant, negative Murphy sign Extremities: extremities normal, atraumatic, no cyanosis or edema Pulses: 2+ and symmetric Skin: Bilateral erythema  Neurologic: Grossly normal  EKG: Deferred  ASSESSMENT: 1. Coronary artery disease status post CABG x 3 vessels in 2013 2. Paroxysmal atrial flutter-CHADSVASC score of 5 on Xarelto 3. Tachybradycardia syndrome status post St. Jude permanent pacemaker (07/2016) 4. Diabetes type 2 - now on  insulin 5. Hypertension 6. Dyslipidemia 7. Peripheral neuropathy - secondary to diabetes 8. Progressive memory loss 9. Low back pain and leg weakness  PLAN: 1.   Ms. Heslin has had some improvement in her energy level after placement of a pacemaker and cardioversion for atrial flutter. She appears to be maintaining a sinus rhythm with a CHADSVASC score of 5 and is anticoagulated on Xarelto. We will continue that long-term. She has a pacemaker follow-up with Dr. Curt Bears in January. Blood pressure still not at goal. As the pacemaker is now in place, I believe we could add a beta blocker for additional blood pressure control. Her daughter has significant concern about her sensitivity to medications or for we'll start with very low-dose carvedilol 3.125 mg twice daily. Follow-up with me in 3 months.  [name], MD, [facility] Attending Cardiologist [facility]  Nadean Corwin Hilty 09/22/2016, 1:32 PM

## 2016-09-24 LAB — T4, FREE: Free T4: 1.1 ng/dL (ref 0.8–1.8)

## 2016-09-27 ENCOUNTER — Other Ambulatory Visit: Payer: Self-pay | Admitting: Internal Medicine

## 2016-10-29 ENCOUNTER — Encounter: Payer: Self-pay | Admitting: Neurology

## 2016-10-29 ENCOUNTER — Other Ambulatory Visit: Payer: Self-pay | Admitting: Internal Medicine

## 2016-11-17 ENCOUNTER — Encounter: Payer: Self-pay | Admitting: *Deleted

## 2016-11-18 ENCOUNTER — Telehealth: Payer: Self-pay | Admitting: Cardiology

## 2016-11-18 NOTE — Telephone Encounter (Signed)
Pt calling in about her appt next week.  Wanted to confirm that she had the right day. Informed pt of date/time of appt next week.

## 2016-11-18 NOTE — Telephone Encounter (Signed)
New Message  Pt voiced wanting nurse to give her a call about an x-ray.   Please f/u

## 2016-11-23 ENCOUNTER — Other Ambulatory Visit: Payer: Self-pay | Admitting: Internal Medicine

## 2016-11-23 ENCOUNTER — Ambulatory Visit (INDEPENDENT_AMBULATORY_CARE_PROVIDER_SITE_OTHER): Payer: Medicare Other | Admitting: Cardiology

## 2016-11-23 ENCOUNTER — Ambulatory Visit
Admission: RE | Admit: 2016-11-23 | Discharge: 2016-11-23 | Disposition: A | Discharge status: Home / Self Care | Payer: Medicare Other | Source: Ambulatory Visit | Attending: Internal Medicine | Admitting: Internal Medicine

## 2016-11-23 ENCOUNTER — Encounter: Payer: Self-pay | Admitting: Cardiology

## 2016-11-23 VITALS — BP 130/64 | HR 60 | Ht 60.0 in | Wt 138.2 lb

## 2016-11-23 DIAGNOSIS — R0781 Pleurodynia: Secondary | ICD-10-CM

## 2016-11-23 DIAGNOSIS — R58 Hemorrhage, not elsewhere classified: Secondary | ICD-10-CM

## 2016-11-23 DIAGNOSIS — I495 Sick sinus syndrome: Secondary | ICD-10-CM

## 2016-11-23 DIAGNOSIS — Z950 Presence of cardiac pacemaker: Secondary | ICD-10-CM | POA: Diagnosis not present

## 2016-11-23 LAB — CUP PACEART INCLINIC DEVICE CHECK
Battery Voltage: 2.99 V
Brady Statistic RA Percent Paced: 97 %
Brady Statistic RV Percent Paced: 9.4 %
Date Time Interrogation Session: 20180130130147
Implantable Lead Implant Date: 20171019
Implantable Lead Implant Date: 20171019
Implantable Lead Location: 753859
Implantable Lead Location: 753860
Implantable Pulse Generator Implant Date: 20171019
Lead Channel Impedance Value: 437.5 Ohm
Lead Channel Impedance Value: 687.5 Ohm
Lead Channel Pacing Threshold Amplitude: 0.5 V
Lead Channel Pacing Threshold Amplitude: 0.5 V
Lead Channel Pacing Threshold Amplitude: 1 V
Lead Channel Pacing Threshold Amplitude: 1 V
Lead Channel Pacing Threshold Pulse Width: 0.4 ms
Lead Channel Pacing Threshold Pulse Width: 0.4 ms
Lead Channel Pacing Threshold Pulse Width: 0.4 ms
Lead Channel Pacing Threshold Pulse Width: 0.4 ms
Lead Channel Sensing Intrinsic Amplitude: 12 mV
Lead Channel Sensing Intrinsic Amplitude: 2.8 mV
Lead Channel Setting Pacing Amplitude: 2 V
Lead Channel Setting Pacing Amplitude: 2.5 V
Lead Channel Setting Pacing Pulse Width: 0.4 ms
Lead Channel Setting Sensing Sensitivity: 2 mV
Pulse Gen Model: 2272
Pulse Gen Serial Number: 3180458

## 2016-11-23 MED ORDER — AMIODARONE HCL 200 MG PO TABS: 100.0000 mg | ORAL_TABLET | Freq: Every day | ORAL | 6 refills | Status: DC

## 2016-11-23 NOTE — Patient Instructions (Addendum)
Medication Instructions:    Your physician has recommended you make the following change in your medication: 1) DECREASE Amiodarone to 100 mg once daily  --- If you need a refill on your cardiac medications before your next appointment, please call your pharmacy. ---  Labwork:  None ordered  Testing/Procedures:  None ordered  Follow-Up: Remote monitoring is used to monitor your Pacemaker of ICD from home. This monitoring reduces the number of office visits required to check your device to one time per year. It allows [place] to keep an eye on the functioning of your device to ensure it is working properly. You are scheduled for a device check from home on 02/22/2017. You may send your transmission at any time that day. If you have a wireless device, the transmission will be sent automatically. After your physician reviews your transmission, you will receive a postcard with your next transmission date.   Your physician wants you to follow-up in: 6 months with Dr. Curt Bears.  You will receive a reminder letter in the mail two months in advance. If you don't receive a letter, please call our office to schedule the follow-up appointment.  Thank you for choosing [facility]!!   Trinidad Curet, RN [phone]

## 2016-11-23 NOTE — Progress Notes (Signed)
Electrophysiology Office Note   Date:  11/23/2016   ID:  [name], DOB 03/07/1941, MRN [id number]  PCP:  Leeroy Cha, MD  Cardiologist:  Debara Pickett Primary Electrophysiologist:  Will Meredith Leeds, MD    Chief Complaint  Patient presents with  . Pacemaker Check    91 days post implant/Tachy/Brady syndrome     History of Present Illness: [name] is a 76 y.o. female who presents today for electrophysiology evaluation.   She has a history of atypical atrial flutter, sinus bradycardia, hypertension, and coronary artery disease status post CABG. She had a TEE and cardioversion in October for atypical atrial flutter. She was having exercise intolerance and fatigue prior to her cardioversion. Had dual chamber pacemaker placed 08/12/16. That time, she has had significant amounts of fatigue. Currently today she is atrially paced and atrially dependent. She is also been having some episodes of shakiness. Her shakiness was worse during the loading dose of the amiodarone.   Today, she denies symptoms of palpitations, chest pain, shortness of breath, orthopnea, PND, lower extremity edema, claudication, , presyncope, syncope, bleeding, or neurologic sequela. The patient is tolerating medications without difficulties and is otherwise without complaint today.    Past Medical History:  Diagnosis Date  . Arthritis    "in my hands"  . Carotid artery disease ([place])    a. 40-59% bilaterally 10/2015.  Marland Kitchen Chronic diastolic CHF (congestive heart failure) ([place])    a. 05/2016 Echo: EF 60-65%, no rwma, Gr1 DD, Ao sclerosis w/o stenosis, triv MR;  b. 07/2016 TEE: EF 55-60%, no rwma, mild MR.  . CKD (chronic kidney disease), stage III   . Coronary artery disease    a. 02/2007 Persantine MV: low risk;  b. 11/2011 CABG x 3 (LIMA->LAD, VG->OM, VG->RCA);  c. 05/2016 MV: EF >65%, no isch/infarct, horiz ST dep in I, II, V5-V6.  Marland Kitchen Depression   . Diabetes mellitus   . Diverticulosis   . Esophageal  stricture   . GERD (gastroesophageal reflux disease)   . Hemorrhoids   . Hiatal hernia   . Hyperkalemia    a. ARB stopped due to this.  . Hyperlipidemia   . Hypertensive heart disease   . Hypothyroidism   . Mild cognitive impairment    a. seen by neurology.  Marland Kitchen PAF (paroxysmal atrial fibrillation) ([facility])    a. post-op CABG.  . Pain    RIGHT KNEE PAIN - TORN RIGHT MEDIAL MENISCUS  . Paroxysmal atrial flutter ([place])    a. 07/2016 s/p TEE & DCCV;  b. 07/2016 Recurrent PAFlutter req initiation of amio & PPM in setting of tachy-brady;  c. CHA2DS2VASc = 7-->Xarelto 15 mg QD.  Marland Kitchen PONV (postoperative nausea and vomiting)   . S/P CABG (coronary artery bypass graft), 12/04/11 12/07/2011   LIMA to LAD, SVG to OM, SVG to RCA  . Sinus bradycardia    a. not on BB due to this.  . Tachy-brady syndrome ([place])    a. 07/2016 Jxnl brady following DCCV, recurrent Aflutter-->amio + SJM 2272 Assurity MRI DC PPM (ser # [id number]).   Past Surgical History:  Procedure Laterality Date  . ABDOMINAL HYSTERECTOMY  1980's  . BACK SURGERY  2006   "cyst growing near my spine"  . CARDIAC CATHETERIZATION  12/02/2011   mild LV dysfunction with mod hypocontractility of mid-distal anterolateral wall; CAD w/ostial tapering of L [place] with 50% diffuse ostial narrowing of LAD, 99% eccentric focal prox LAD stenosis followed by 70% prox LAD stenosis after  1st diag, 20% mid LAD narrowing; 80% ostial-to-prox L Cfx stenosis & 40-50% irregularity of RCA (Dr. Corky Downs)  . CARDIOVERSION N/A 08/11/2016   Procedure: CARDIOVERSION;  Surgeon: Lelon Perla, MD;  Location: [facility];  Service: Cardiovascular;  Laterality: N/A;  . CATARACT EXTRACTION W/ INTRAOCULAR LENS  IMPLANT, BILATERAL  ~ 2010  . [place]  . CORONARY ARTERY BYPASS GRAFT  12/04/2011   Procedure: CORONARY ARTERY BYPASS GRAFTING (CABG);  Surgeon: Tharon Aquas Adelene Idler, MD;  Location: [facility];  Service: Open Heart Surgery;  Laterality: N/A;  CABG x three,  using  left internal mammary artery, and right leg greater saphenous vein harvested endoscopically  . DILATION AND CURETTAGE OF UTERUS     "a couple times"  . EP IMPLANTABLE DEVICE N/A 08/12/2016   Procedure: Pacemaker Implant;  Surgeon: Will Meredith Leeds, MD;  Location: [facility];  Service: Cardiovascular;  Laterality: N/A;  . FRACTURE SURGERY     "put pins both side right ankle"  . JOINT REPLACEMENT    . KNEE ARTHROSCOPY WITH MEDIAL MENISECTOMY Right 07/02/2014   Procedure: RIGHT KNEE ARTHROSCOPY WITH PARTIAL MEDIAL MENISTECTOMY, ABRASION CONDROPLASTYU OF PATELLA,ABRASION CONDROPLASTY OF MEDIAL FEMEROL CONDYL, MICROFRACTURE OF MEDIAL FEMEROL CONDYL;  Surgeon: Tobi Bastos, MD;  Location: [facility];  Service: Orthopedics;  Laterality: Right;  . LEFT HEART CATHETERIZATION WITH CORONARY ANGIOGRAM N/A 12/02/2011   Procedure: LEFT HEART CATHETERIZATION WITH CORONARY ANGIOGRAM;  Surgeon: Troy Sine, MD;  Location: [facility];  Service: Cardiovascular;  Laterality: N/A;  Coronary angiogram, possible PCI  . TEE WITHOUT CARDIOVERSION N/A 08/11/2016   Procedure: TRANSESOPHAGEAL ECHOCARDIOGRAM (TEE);  Surgeon: Lelon Perla, MD;  Location: [facility];  Service: Cardiovascular;  Laterality: N/A;  . TONSILLECTOMY  1949  . TOTAL KNEE ARTHROPLASTY  ~ 2006   left  . TRANSTHORACIC ECHOCARDIOGRAM  02/19/2013   EF 95-28%, grade 1 diastolic dysfunction; mildly thickend/calcified AV leaflets; mildly calcidied MV annulus; mild TR     Current Outpatient Prescriptions  Medication Sig Dispense Refill  . amiodarone (PACERONE) 200 MG tablet Take 1 tablet (200 mg total) by mouth daily. 30 tablet 2  . amLODipine (NORVASC) 10 MG tablet TAKE ONE TABLET BY MOUTH ONCE DAILY IN THE MORNING 30 tablet 11  . aspirin 81 MG chewable tablet Chew 1 tablet (81 mg total) by mouth daily. 30 tablet 1  . carvedilol (COREG) 3.125 MG tablet Take 1 tablet (3.125 mg total) by mouth 2 (two) times daily. 60 tablet 3  .  Cholecalciferol (VITAMIN D3 PO) Take 1 capsule by mouth daily.    . Cyanocobalamin (VITAMIN B-12 PO) Take 1 tablet by mouth daily.    Marland Kitchen doxazosin (CARDURA) 4 MG tablet Take 1 tablet (4 mg total) by mouth daily. 30 tablet 6  . esomeprazole (NEXIUM) 20 MG capsule TAKE ONE CAPSULE BY MOUTH ONCE DAILY AT  12  NOON 90 capsule 1  . furosemide (LASIX) 20 MG tablet Take 20 mg by mouth daily.     . furosemide (LASIX) 20 MG tablet TAKE ONE TABLET BY MOUTH ONCE DAILY 30 tablet 10  . gabapentin (NEURONTIN) 300 MG capsule Take 300-600 mg by mouth See admin instructions. Take 300 mg by mouth in the morning and take 600 mg by mouth at bedtime    . insulin glargine (LANTUS) 100 UNIT/ML injection Inject 14 Units into the skin every morning.     Marland Kitchen levothyroxine (SYNTHROID, LEVOTHROID) 75 MCG tablet Take 75 mcg by mouth  daily before breakfast.    . meclizine (ANTIVERT) 25 MG tablet Take 25 mg by mouth daily as needed for dizziness. For dizziness    . Multiple Vitamins-Minerals (PRESERVISION AREDS 2 PO) Take 1 tablet by mouth daily.    Vladimir Faster Glycol-Propyl Glycol (SYSTANE OP) Place 1 drop into both eyes 2 (two) times daily.     . Rivaroxaban (XARELTO) 15 MG TABS tablet Take 1 tablet (15 mg total) by mouth daily with supper. 30 tablet 12  . sitaGLIPtin-metformin (JANUMET) 50-1000 MG per tablet Take 1 tablet by mouth 2 (two) times daily with a meal.    . vitamin C (ASCORBIC ACID) 500 MG tablet Take 500 mg by mouth daily.    Marland Kitchen VITAMIN E PO Take 1 capsule by mouth daily.     No current facility-administered medications for this visit.     Allergies:   Clonidine derivatives; Crestor [rosuvastatin calcium]; Epinephrine; Hydralazine; Losartan; Other; and Sulfa antibiotics   Social History:  The patient  reports that she has never smoked. She has never used smokeless tobacco. She reports that she drinks alcohol. She reports that she does not use drugs.   Family History:  The patient's family history includes Breast  cancer in her sister and sister; CVA in her mother; Diabetes in her mother and sister; Heart disease in her brother, father, and sister; Hyperlipidemia in her father; Hypertension in her mother; Lung cancer in her sister.    ROS:  Please see the history of present illness.   Otherwise, review of systems is positive for dizziness.   All other systems are reviewed and negative.    PHYSICAL EXAM: VS:  BP 130/64   Pulse 60   Ht 5' (1.524 m)   Wt 138 lb 3.2 oz (62.7 kg)   BMI 26.99 kg/m  , BMI Body mass index is 26.99 kg/m. GEN: Well nourished, well developed, in no acute distress  HEENT: normal  Neck: no JVD, carotid bruits, or masses Cardiac: RRR; no murmurs, rubs, or gallops,no edema  Respiratory:  clear to auscultation bilaterally, normal work of breathing GI: soft, nontender, nondistended, + BS MS: no deformity or atrophy  Skin: warm and dry,  device pocket is well healed Neuro:  Strength and sensation are intact Psych: euthymic mood, full affect  EKG:  EKG is ordered today. Personal review of the ekg ordered shows AV paced  Device interrogation is reviewed today in detail.  See PaceArt for details.   Recent Labs: 05/23/2016: B Natriuretic Peptide 359.7 08/14/2016: ALT 7 09/22/2016: BUN 30; Creat 1.59; Hemoglobin 9.9; Platelets 194; Potassium 5.2; Sodium 143; TSH 8.44    Lipid Panel     Component Value Date/Time   CHOL 330 (H) 08/08/2016 2130   TRIG 313 (H) 08/08/2016 2130   HDL 42 08/08/2016 2130   CHOLHDL 7.9 08/08/2016 2130   VLDL 63 (H) 08/08/2016 2130   LDLCALC 225 (H) 08/08/2016 2130     Wt Readings from Last 3 Encounters:  11/23/16 138 lb 3.2 oz (62.7 kg)  09/22/16 138 lb 6.4 oz (62.8 kg)  08/31/16 133 lb (60.3 kg)      Other studies Reviewed: Additional studies/ records that were reviewed today include: TTE 05/25/16  Review of the above records today demonstrates:  - Left ventricle: The cavity size was normal. Wall thickness was   normal. Systolic  function was normal. The estimated ejection   fraction was in the range of 60% to 65%. Wall motion was normal;   there  were no regional wall motion abnormalities. Doppler   parameters are consistent with abnormal left ventricular   relaxation (grade 1 diastolic dysfunction). The E/e&' ratio is   between 8-15, suggesting indeterminate LV filling pressure. - Aortic valve: Trileaflet. Sclerosis without stenosis.   Transvalvular velocity was minimally increased. There was no   stenosis. There was no regurgitation. - Mitral valve: Mildly thickened leaflets . There was trivial   regurgitation. - Left atrium: The atrium was normal in size. - Tricuspid valve: There was no significant regurgitation. - Inferior vena cava: The vessel was normal in size. The   respirophasic diameter changes were in the normal range (>= 50%),   consistent with normal central venous pressure.   ASSESSMENT AND PLAN:  1. Atypical atrial flutter TEE/DCCV 10/18 with recurrent atrial flutter. Currently on Xarelto and amiodarone. She is feeling well overall, though she does have some episodes of shaking. We will decrease her amiodarone to 100 mg daily.  This patients CHA2DS2-VASc Score and unadjusted Ischemic Stroke Rate (% per year) is equal to 9.7 % stroke rate/year from a score of 6  Above score calculated as 1 point each if present [CHF, HTN, DM, Vascular=MI/PAD/Aortic Plaque, Age if 65-74, or Female] Above score calculated as 2 points each if present [Age > 75, or Stroke/TIA/TE]  2. Tachybradycardia syndrome Dual chamber pacemaker placed 08/12/16. She is having episodes of dizziness. His atrial lead dependent without rate response on. We'll turn it on today.  3. HTN  Currently well controlled. Continue current management  4. CAD s/p CABG No recent ischemic symptoms Continue current therapy No BB currently with prior bradycardia    Current medicines are reviewed at length with the patient today.     The patient does not have concerns regarding her medicines.  The following changes were made today:  Crease amiodarone to 100 mg daily  Labs/ tests ordered today include:  No orders of the defined types were placed in this encounter.    Disposition:   FU with Will Camnitz 6 months  Signed, Will Meredith Leeds, MD  11/23/2016 11:55 AM     [facility] [address] [place] [address] [phone] (office) [phone] (fax)

## 2016-12-14 ENCOUNTER — Other Ambulatory Visit: Payer: Self-pay | Admitting: Cardiology

## 2016-12-23 ENCOUNTER — Ambulatory Visit (INDEPENDENT_AMBULATORY_CARE_PROVIDER_SITE_OTHER): Payer: Medicare Other | Admitting: Internal Medicine

## 2016-12-23 ENCOUNTER — Encounter: Payer: Self-pay | Admitting: Internal Medicine

## 2016-12-23 VITALS — BP 156/70 | HR 60 | Ht 60.0 in | Wt 135.6 lb

## 2016-12-23 DIAGNOSIS — Z950 Presence of cardiac pacemaker: Secondary | ICD-10-CM

## 2016-12-23 DIAGNOSIS — I495 Sick sinus syndrome: Secondary | ICD-10-CM | POA: Diagnosis not present

## 2016-12-23 DIAGNOSIS — Z951 Presence of aortocoronary bypass graft: Secondary | ICD-10-CM | POA: Diagnosis not present

## 2016-12-23 DIAGNOSIS — I4892 Unspecified atrial flutter: Secondary | ICD-10-CM

## 2016-12-23 DIAGNOSIS — I5032 Chronic diastolic (congestive) heart failure: Secondary | ICD-10-CM

## 2016-12-23 MED ORDER — CARVEDILOL 6.25 MG PO TABS: 6.2500 mg | ORAL_TABLET | Freq: Two times a day (BID) | ORAL | 5 refills | Status: DC

## 2016-12-23 NOTE — Patient Instructions (Addendum)
Dr. Debara Pickett has recommended compression stockings.  -- 20-18mmHg -- bilateral, knee high  Your physician has recommended you make the following change in your medication:  -- INCREASE carvedilol to 6.25mg  twice daily  Your physician recommends that you schedule a follow-up appointment in 2-3 weeks with clinical pharmacist for blood pressure check appointment  Your physician recommends that you schedule a follow-up appointment in: [place] with Dr. Debara Pickett.

## 2016-12-23 NOTE — Progress Notes (Signed)
OFFICE NOTE  Chief Complaint:  "Shaky"  Primary Care Physician: Leeroy Cha, MD  HPI:  [name] is a pleasant 76 year old female with history of coronary disease and CABG x 3 vessels in 2013, LIMA to LAD, SVG to OM and SVG to right coronary. She underwent an echocardiogram which demonstrates normal EF of 55% to 65%. However, there is stage 1 diastolic dysfunction and evidence for elevated mean left atrial filling pressure. There were mildly thickened calcified aortic valve leaflets but no stenosis and mild tricuspid regurgitation. At the time, I started her on Lasix 20 mg daily. A BMP was obtained that day which indicated mild elevation at 75.5, creatinine was 1.16. She has been taking the Lasix with improvement in her lower extremity swelling although shortness of breath seems to be about the same. Again, she notes it is only with marked exertion, not necessarily with normal activities. However, she does have ongoing fatigue and easy fatigability. This may be due to just poor exercise tolerance. She also reported that recently you started her on a new SLGT2 diabetic medicine and she said that she took several doses of that, which I believe was Invokana, and she became markedly dizzy with that. She has since stopped that medication. Again, overall the patient is fairly stable with a normal ejection fraction. She recently had bypass and is not having any anginal symptoms and therefore I do not suspect that this is an issue related to her bypass grafts.   I saw [name] today in the office for an acute visit regarding what is described as abdominal pain. She reports a numbness and pain sensation over the right upper quadrant. She's also noted some firmness and distention of her abdomen. This is recently presented to a GI doctor in [place] who thought she might have a gastric ulcer. He performed endoscopy which was a reportedly unrevealing. He also recommended she is take MiraLAX  for constipation. She says that recently she's been constipated but also has been having some small amount of loose stool. She did not want to take the MiraLAX given the loose stool. She also reports some of the discomfort extends across the mid epigastrium and into the lower chest. It is entirely atypical for cardiac chest pain. She reports that she had a colonoscopy in the past by Dr. Henrene Pastor about 5 years ago and a repeat colonoscopy was recommended around 2018. She also tells me that she recently had a right upper quadrant ultrasound performed at [facility] after presenting with abdominal pain which was negative for acute biliary disease.  [name] returns today for follow-up. Overall she seems to be doing well from a cardiac standpoint. I understand she is switched primary care providers to [facility] primary care, however she is unclear of her primary care provider. She does have an upcoming appointment to see Jackelyn Knife neurology. Recently she's been having trouble with forgetfulness and it seems to be getting worse. She tells me she lost her cell phone at home and has not been able to locate it. She denies any chest pain or shortness of breath and in fact her flank back and abdominal pain which was evaluated extensively has now gone away.  I had the pleasure of seeing [name] back today in follow-up. It's been 6 months and she'll last appointment. Her main concern today is leg weakness. She reports she has some back pain and associated weakness in her legs that give out from time to  time. She recently had memory testing indicating possible dementia as well as likely depression. It was recommended that she go on therapy however she declined medications. She denies any chest pain or worsening shortness of breath.  07/01/2016  [name] returns today for follow-up. She was recently hospitalized for acute diastolic heart failure. She was treated with diuretics and adjustments are  made to her blood pressure medications. She had a repeat echocardiogram which was essentially stable showed normal LV EF and diastolic dysfunction. After discharge she underwent nuclear stress testing which was negative for ischemia. She saw Ignacia Bayley, NP, who made some changes to her blood pressure medications including placing her on clonidine. Since taking this medicine she has had some of the typical symptoms of fatigue and dry mouth as well as constipation. She was alert initially taking clonidine 2 mg twice daily. She then decreased it to one half tablet or 1 mg twice daily but blood pressure remains high. I checked her blood pressure manually today 160/60. She reports fatigue and low energy.  09/22/2016  [name] returns today for hospital follow-up. 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 She had a St. Jude surety MRI safe pacemaker placed. She was also started on amiodarone and underwent cardioversion. She seems to be maintaining sinus rhythm. She reports improvement in her energy. She recently saw Ignacia Bayley, NP, in follow-up who reported she was doing well although noted her blood pressure was more elevated. She was placed on doxazosin 4 mg daily and had initial improvement in blood pressure however blood pressure has since gone up. Today was 186/62. Unfortunately, she's been intolerant of a number of blood pressure medications in the past.  12/23/2016  [name] was seen in follow-up today. She saw Dr. candidates in January who noted that she reported being shaky on amiodarone and he decreased the dose down to 100 mg daily. She remains in an AV paced rhythm but there has been recurrent underlying atrial flutter. She is also had some fatigue. The pressure is elevated today 156/70. I reviewed home monitoring indicates blood pressures continue to be elevated. She seems to be okay with these numbers but she is at increased  risk for events. Unfortunate she's had intolerance to number of medications.  PMHx:  Past Medical History:  Diagnosis Date  . Arthritis    "in my hands"  . Carotid artery disease ([place])    a. 40-59% bilaterally 10/2015.  Marland Kitchen Chronic diastolic CHF (congestive heart failure) ([place])    a. 05/2016 Echo: EF 60-65%, no rwma, Gr1 DD, Ao sclerosis w/o stenosis, triv MR;  b. 07/2016 TEE: EF 55-60%, no rwma, mild MR.  . CKD (chronic kidney disease), stage III   . Coronary artery disease    a. 02/2007 Persantine MV: low risk;  b. 11/2011 CABG x 3 (LIMA->LAD, VG->OM, VG->RCA);  c. 05/2016 MV: EF >65%, no isch/infarct, horiz ST dep in I, II, V5-V6.  Marland Kitchen Depression   . Diabetes mellitus   . Diverticulosis   . Esophageal stricture   . GERD (gastroesophageal reflux disease)   . Hemorrhoids   . Hiatal hernia   . Hyperkalemia    a. ARB stopped due to this.  . Hyperlipidemia   . Hypertensive heart disease   . Hypothyroidism   . Mild cognitive impairment    a. seen by neurology.  Marland Kitchen PAF (paroxysmal atrial fibrillation) ([facility])    a. post-op CABG.  . Pain    RIGHT  KNEE PAIN - TORN RIGHT MEDIAL MENISCUS  . Paroxysmal atrial flutter ([place])    a. 07/2016 s/p TEE & DCCV;  b. 07/2016 Recurrent PAFlutter req initiation of amio & PPM in setting of tachy-brady;  c. CHA2DS2VASc = 7-->Xarelto 15 mg QD.  Marland Kitchen PONV (postoperative nausea and vomiting)   . S/P CABG (coronary artery bypass graft), 12/04/11 12/07/2011   LIMA to LAD, SVG to OM, SVG to RCA  . Sinus bradycardia    a. not on BB due to this.  . Tachy-brady syndrome ([place])    a. 07/2016 Jxnl brady following DCCV, recurrent Aflutter-->amio + SJM 2272 Assurity MRI DC PPM (ser # [id number]).    Past Surgical History:  Procedure Laterality Date  . ABDOMINAL HYSTERECTOMY  1980's  . BACK SURGERY  2006   "cyst growing near my spine"  . CARDIAC CATHETERIZATION  12/02/2011   mild LV dysfunction with mod hypocontractility of mid-distal anterolateral wall; CAD w/ostial tapering  of L [place] with 50% diffuse ostial narrowing of LAD, 99% eccentric focal prox LAD stenosis followed by 70% prox LAD stenosis after 1st diag, 20% mid LAD narrowing; 80% ostial-to-prox L Cfx stenosis & 40-50% irregularity of RCA (Dr. Corky Downs)  . CARDIOVERSION N/A 08/11/2016   Procedure: CARDIOVERSION;  Surgeon: Lelon Perla, MD;  Location: [place];  Service: Cardiovascular;  Laterality: N/A;  . CATARACT EXTRACTION W/ INTRAOCULAR LENS  IMPLANT, BILATERAL  ~ 2010  . [place]  . CORONARY ARTERY BYPASS GRAFT  12/04/2011   Procedure: CORONARY ARTERY BYPASS GRAFTING (CABG);  Surgeon: Tharon Aquas Adelene Idler, MD;  Location: [place];  Service: Open Heart Surgery;  Laterality: N/A;  CABG x three,  using left internal mammary artery, and right leg greater saphenous vein harvested endoscopically  . DILATION AND CURETTAGE OF UTERUS     "a couple times"  . EP IMPLANTABLE DEVICE N/A 08/12/2016   Procedure: Pacemaker Implant;  Surgeon: Will Meredith Leeds, MD;  Location: [place] [facility];  Service: Cardiovascular;  Laterality: N/A;  . FRACTURE SURGERY     "put pins both side right ankle"  . JOINT REPLACEMENT    . KNEE ARTHROSCOPY WITH MEDIAL MENISECTOMY Right 07/02/2014   Procedure: RIGHT KNEE ARTHROSCOPY WITH PARTIAL MEDIAL MENISTECTOMY, ABRASION CONDROPLASTYU OF PATELLA,ABRASION CONDROPLASTY OF MEDIAL FEMEROL CONDYL, MICROFRACTURE OF MEDIAL FEMEROL CONDYL;  Surgeon: Tobi Bastos, MD;  Location: [facility];  Service: Orthopedics;  Laterality: Right;  . LEFT HEART CATHETERIZATION WITH CORONARY ANGIOGRAM N/A 12/02/2011   Procedure: LEFT HEART CATHETERIZATION WITH CORONARY ANGIOGRAM;  Surgeon: Troy Sine, MD;  [facility] C[facility];  Service: Cardiovascular;  Laterality: N/A;  Coronary angiogram, possible PCI  . TEE WITHOUT CARDIOVERSION N/A 08/11/2016   Procedure: TRANSESOPHAGEAL ECHOCARDIOGRAM (TEE);  Surgeon: Lelon Perla, MD;  Location: [place];  Service: Cardiovascular;  Laterality:  N/A;  . TONSILLECTOMY  1949  . TOTAL KNEE ARTHROPLASTY  ~ 2006   left  . TRANSTHORACIC ECHOCARDIOGRAM  02/19/2013   EF 30-86%, grade 1 diastolic dysfunction; mildly thickend/calcified AV leaflets; mildly calcidied MV annulus; mild TR    FAMHx:  Family History  Problem Relation Age of Onset  . Diabetes Mother   . CVA Mother   . Hypertension Mother   . Heart disease Father   . Hyperlipidemia Father   . Breast cancer Sister     x 3  . Heart disease Brother     x5; one with MI  . Heart disease Sister  x3  . Diabetes Sister     x3  . Lung cancer Sister   . Breast cancer Sister     x2  . Colon cancer Neg Hx     SOCHx:   reports that she has never smoked. She has never used smokeless tobacco. She reports that she drinks alcohol. She reports that she does not use drugs.  ALLERGIES:  Allergies  Allergen Reactions  . Clonidine Derivatives Other (See Comments)    Bradycardia and fatigue   . Crestor [Rosuvastatin Calcium] Other (See Comments)    Other reaction(s): tired and weak  . Epinephrine Other (See Comments)    Abnormal feeling. Dental exam/injection of local w/ epi.  Marland Kitchen Hydralazine Other (See Comments)    Nausea/gi upset   . Losartan Other (See Comments)    Hyperkalemia   . Other Other (See Comments)    MANGO'S - WHELPS ALL OVER  . Sulfa Antibiotics Other (See Comments)    Unknown    ROS: Pertinent items noted in HPI and remainder of comprehensive ROS otherwise negative.  HOME MEDS: Current Outpatient Prescriptions  Medication Sig Dispense Refill  . amiodarone (PACERONE) 200 MG tablet Take 0.5 tablets (100 mg total) by mouth daily. 30 tablet 6  . amLODipine (NORVASC) 10 MG tablet TAKE ONE TABLET BY MOUTH ONCE DAILY IN THE MORNING 30 tablet 11  . aspirin 81 MG chewable tablet Chew 1 tablet (81 mg total) by mouth daily. 30 tablet 1  . Cholecalciferol (VITAMIN D3 PO) Take 1 capsule by mouth daily.    . Cyanocobalamin (VITAMIN B-12 PO) Take 1 tablet by mouth  daily.    Marland Kitchen doxazosin (CARDURA) 4 MG tablet Take 1 tablet (4 mg total) by mouth daily. 30 tablet 6  . esomeprazole (NEXIUM) 20 MG capsule TAKE ONE CAPSULE BY MOUTH ONCE DAILY AT  12  NOON 90 capsule 1  . furosemide (LASIX) 20 MG tablet Take 20 mg by mouth daily.     Marland Kitchen gabapentin (NEURONTIN) 300 MG capsule Take 300-600 mg by mouth See admin instructions. Take 300 mg by mouth in the morning and take 600 mg by mouth at bedtime    . insulin glargine (LANTUS) 100 UNIT/ML injection Inject 14 Units into the skin every morning.     Marland Kitchen levothyroxine (SYNTHROID) 88 MCG tablet Take 1 tablet by mouth daily.    . meclizine (ANTIVERT) 25 MG tablet Take 25 mg by mouth daily as needed for dizziness. For dizziness    . Multiple Vitamins-Minerals (PRESERVISION AREDS 2 PO) Take 1 tablet by mouth daily.    Vladimir Faster Glycol-Propyl Glycol (SYSTANE OP) Place 1 drop into both eyes 2 (two) times daily.     . Rivaroxaban (XARELTO) 15 MG TABS tablet Take 1 tablet (15 mg total) by mouth daily with supper. 30 tablet 12  . sitaGLIPtin-metformin (JANUMET) 50-1000 MG per tablet Take 1 tablet by mouth 2 (two) times daily with a meal.    . vitamin C (ASCORBIC ACID) 500 MG tablet Take 500 mg by mouth daily.    Marland Kitchen VITAMIN E PO Take 1 capsule by mouth daily.    . carvedilol (COREG) 6.25 MG tablet Take 1 tablet (6.25 mg total) by mouth 2 (two) times daily. 60 tablet 5   No current facility-administered medications for this visit.     LABS/IMAGING: No results found for this or any previous visit (from the past 48 hour(s)). No results found.  VITALS: BP (!) 156/70 (BP Location: Left Arm)  Pulse 60   Ht 5' (1.524 m)   Wt 135 lb 9.6 oz (61.5 kg)   BMI 26.48 kg/m   EXAM: General appearance: alert and no distress Neck: no carotid bruit and no JVD Lungs: clear to auscultation bilaterally Heart: regular rate and rhythm, S1, S2 normal, no murmur, click, rub or gallop Abdomen: Protuberant, distended with mild to be need to  percussion, no rebound and mild guarding in the right upper quadrant, negative Murphy sign Extremities: extremities normal, atraumatic, no cyanosis or edema Pulses: 2+ and symmetric Skin: Bilateral erythema Neurologic: Grossly normal  EKG: AV paced rhythm at 60  ASSESSMENT: 1. Coronary artery disease status post CABG x 3 vessels in 2013 2. Paroxysmal atrial flutter-CHADSVASC score of 5 on Xarelto 3. Tachybradycardia syndrome status post St. Jude permanent pacemaker (07/2016) 4. Diabetes type 2 - now on insulin 5. Hypertension 6. Dyslipidemia 7. Peripheral neuropathy - secondary to diabetes 8. Progressive memory loss 9. Low back pain and leg weakness  PLAN: 1.   Lyn continues to have some shakiness and tremulousness. I wonder if this is an essential tremor or perhaps related to medicine. He does have peripheral neuropathy and memory loss and is followed by Dr. Carles Collet with neurology. Blood pressure is still not at goal. We talked about several options and she was hesitant to change medications. I think she could stand increasing carvedilol to 6.25 mg twice daily and we can see how she tolerates that. She'll need a blood pressure check in a few weeks. If this is not successful he may need to consider switching from carvedilol over to a more potent blood pressure lowering medicines such as Bystolic.  [name], MD, [facility] Attending Cardiologist [place] 12/23/2016, 1:43 PM

## 2017-01-06 ENCOUNTER — Ambulatory Visit (INDEPENDENT_AMBULATORY_CARE_PROVIDER_SITE_OTHER): Payer: Medicare Other | Admitting: Pharmacist Clinician (PhC)/ Clinical Pharmacy Specialist

## 2017-01-06 DIAGNOSIS — I10 Essential (primary) hypertension: Secondary | ICD-10-CM | POA: Diagnosis not present

## 2017-01-06 NOTE — Assessment & Plan Note (Signed)
Patient with personal and family history of CAD, with BP still somewhat elevated at 152/60 today. Patient is happy with her current blood pressure readings and is adamant about not adding more medications.  With her current regimen and allergies, about the only thing left to try would be a low dose of chlorthalidone.  Her CrCl is about 28, so would still be able to use, though I would recommend starting at 12.5 mg daily.  At this time she is content to not add this in and is aware that her BP is not at the goal Dr. Debara Pickett would like.  I did ask that she switch her amlodipine to mornings and doxazosin to nights.  It could be that some of her daytime dizziness is more associated with the doxazosin than anything else.  I gave her my direct phone number and asked that she consider taking the chlorthalidone, and to call if she would be willing.

## 2017-01-06 NOTE — Patient Instructions (Signed)
If you decide that you would like to take a new medication to help with your blood pressure please give [place] a call.  Kristin/Raquel at [phone].  Your blood pressure today is 152/60    Check your blood pressure at home daily and keep record of the readings.  Take your BP meds as follows:  Switch amlodipine to mornings  Switch doxazosin to evenings  Continue all other medications  Bring all of your meds, your BP cuff and your record of home blood pressures to your next appointment.  Exercise as you're able, try to walk approximately 30 minutes per day.  Keep salt intake to a minimum, especially watch canned and prepared boxed foods.  Eat more fresh fruits and vegetables and fewer canned items.  Avoid eating in fast food restaurants.    HOW TO TAKE YOUR BLOOD PRESSURE: . Rest 5 minutes before taking your blood pressure. .  Don't smoke or drink caffeinated beverages for at least 30 minutes before. . Take your blood pressure before (not after) you eat. . Sit comfortably with your back supported and both feet on the floor (don't cross your legs). . Elevate your arm to heart level on a table or a desk. . Use the proper sized cuff. It should fit smoothly and snugly around your bare upper arm. There should be enough room to slip a fingertip under the cuff. The bottom edge of the cuff should be 1 inch above the crease of the elbow. . Ideally, take 3 measurements at one sitting and record the average.

## 2017-01-06 NOTE — Progress Notes (Signed)
01/06/2017 [name] 06/18/1941 [id number]   HPI:  [name] is a 76 y.o. female patient of Dr Debara Pickett, with a PMH below who presents today for hypertension clinic evaluation.  Her cardiac history is significant for CABG x 3 in 0938, stage 1 diastolic dysfunction, normal EF, progressive bradycardia (now with pacemaker) and atrial flutter.  She also suffers from an inner ear problem that leads her to have dizziness on a fairly regular basis.  Today coming into the office she started to tilt to the side, but managed to catch herself before bumping into the wall.  She is here with her daughter and they both state that she has not had any falls as of yet.    Blood Pressure Goal:  130/80   Current Medications:  Amlodipine 10 mg qd (night)  Carvedilol 6.25 mg bid   Doxazosin 4 mg daily (am)  Family Hx:  Mother - hypertension, stroke at 9, died about 1 year later  Father heart disease (died from MI around age 34)  79 siblings - 77/5 brothers died from MI, at least 2 of those were under 50 years old   22 sisters - 2 with CABG, in addition to patient  2 kids with no heart disease to date  Social Hx:  No tobacco; no alcohol; decaf coffee - 1 cup daily, no soda except rarely  Diet:  Mix of eat home/out;  No added salt to foods; eating out 1-2 times per week, admits to liking fried fish,   Exercise:  No regular exercise, does house work, not yard; not stable on her feet  Home BP readings:  Has had cuff for about 5 years, arm cuff;  Home average of 13 readings this month is 152/56.  Range 182-993 systolic, diastolic all WNL.  Intolerances:   Clonidine (fatigue/bradycardia), hydralazine (GI upset/nausea, shaky, weak), losartan (hyperkalemia)  Wt Readings from Last 3 Encounters:  01/06/17 132 lb 9.6 oz (60.1 kg)  12/23/16 135 lb 9.6 oz (61.5 kg)  11/23/16 138 lb 3.2 oz (62.7 kg)   BP Readings from Last 3 Encounters:  01/06/17 (!) 152/60  12/23/16 (!) 156/70  11/23/16 130/64    Pulse Readings from Last 3 Encounters:  01/06/17 68  12/23/16 60  11/23/16 60    Current Outpatient Prescriptions  Medication Sig Dispense Refill  . amiodarone (PACERONE) 200 MG tablet Take 0.5 tablets (100 mg total) by mouth daily. 30 tablet 6  . amLODipine (NORVASC) 10 MG tablet TAKE ONE TABLET BY MOUTH ONCE DAILY IN THE MORNING 30 tablet 11  . aspirin 81 MG chewable tablet Chew 1 tablet (81 mg total) by mouth daily. 30 tablet 1  . carvedilol (COREG) 6.25 MG tablet Take 1 tablet (6.25 mg total) by mouth 2 (two) times daily. 60 tablet 5  . Cholecalciferol (VITAMIN D3 PO) Take 1 capsule by mouth daily.    . Cyanocobalamin (VITAMIN B-12 PO) Take 1 tablet by mouth daily.    Marland Kitchen doxazosin (CARDURA) 4 MG tablet Take 1 tablet (4 mg total) by mouth daily. 30 tablet 6  . esomeprazole (NEXIUM) 20 MG capsule TAKE ONE CAPSULE BY MOUTH ONCE DAILY AT  12  NOON 90 capsule 1  . furosemide (LASIX) 20 MG tablet Take 20 mg by mouth daily.     Marland Kitchen gabapentin (NEURONTIN) 300 MG capsule Take 300-600 mg by mouth See admin instructions. Take 300 mg by mouth in the morning and take 600 mg by mouth at bedtime    .  insulin glargine (LANTUS) 100 UNIT/ML injection Inject 14 Units into the skin every morning.     Marland Kitchen levothyroxine (SYNTHROID) 88 MCG tablet Take 1 tablet by mouth daily.    . meclizine (ANTIVERT) 25 MG tablet Take 25 mg by mouth daily as needed for dizziness. For dizziness    . Multiple Vitamins-Minerals (PRESERVISION AREDS 2 PO) Take 1 tablet by mouth daily.    Vladimir Faster Glycol-Propyl Glycol (SYSTANE OP) Place 1 drop into both eyes 2 (two) times daily.     . Rivaroxaban (XARELTO) 15 MG TABS tablet Take 1 tablet (15 mg total) by mouth daily with supper. 30 tablet 12  . sitaGLIPtin-metformin (JANUMET) 50-1000 MG per tablet Take 1 tablet by mouth 2 (two) times daily with a meal.    . vitamin C (ASCORBIC ACID) 500 MG tablet Take 500 mg by mouth daily.    Marland Kitchen VITAMIN E PO Take 1 capsule by mouth daily.      No current facility-administered medications for this visit.     Allergies  Allergen Reactions  . Clonidine Derivatives Other (See Comments)    Bradycardia and fatigue   . Crestor [Rosuvastatin Calcium] Other (See Comments)    Other reaction(s): tired and weak  . Epinephrine Other (See Comments)    Abnormal feeling. Dental exam/injection of local w/ epi.  Marland Kitchen Hydralazine Other (See Comments)    Nausea/gi upset   . Losartan Other (See Comments)    Hyperkalemia   . Other Other (See Comments)    MANGO'S - WHELPS ALL OVER  . Sulfa Antibiotics Other (See Comments)    Unknown    Past Medical History:  Diagnosis Date  . Arthritis    "in my hands"  . Carotid artery disease ([place])    a. 40-59% bilaterally 10/2015.  Marland Kitchen Chronic diastolic CHF (congestive heart failure) ([place])    a. 05/2016 Echo: EF 60-65%, no rwma, Gr1 DD, Ao sclerosis w/o stenosis, triv MR;  b. 07/2016 TEE: EF 55-60%, no rwma, mild MR.  . CKD (chronic kidney disease), stage III   . Coronary artery disease    a. 02/2007 Persantine MV: low risk;  b. 11/2011 CABG x 3 (LIMA->LAD, VG->OM, VG->RCA);  c. 05/2016 MV: EF >65%, no isch/infarct, horiz ST dep in I, II, V5-V6.  Marland Kitchen Depression   . Diabetes mellitus   . Diverticulosis   . Esophageal stricture   . GERD (gastroesophageal reflux disease)   . Hemorrhoids   . Hiatal hernia   . Hyperkalemia    a. ARB stopped due to this.  . Hyperlipidemia   . Hypertensive heart disease   . Hypothyroidism   . Mild cognitive impairment    a. seen by neurology.  Marland Kitchen PAF (paroxysmal atrial fibrillation) ([facility])    a. post-op CABG.  . Pain    RIGHT KNEE PAIN - TORN RIGHT MEDIAL MENISCUS  . Paroxysmal atrial flutter ([place])    a. 07/2016 s/p TEE & DCCV;  b. 07/2016 Recurrent PAFlutter req initiation of amio & PPM in setting of tachy-brady;  c. CHA2DS2VASc = 7-->Xarelto 15 mg QD.  Marland Kitchen PONV (postoperative nausea and vomiting)   . S/P CABG (coronary artery bypass graft), 12/04/11 12/07/2011   LIMA to  LAD, SVG to OM, SVG to RCA  . Sinus bradycardia    a. not on BB due to this.  . Tachy-brady syndrome ([place])    a. 07/2016 Jxnl brady following DCCV, recurrent Aflutter-->amio + SJM 2272 [employer] MRI DC PPM (ser # [id number]).  Blood pressure (!) 152/60, pulse 68, height 5' (1.524 m), weight 132 lb 9.6 oz (60.1 kg).  HTN (hypertension) Patient with personal and family history of CAD, with BP still somewhat elevated at 152/60 today. Patient is happy with her current blood pressure readings and is adamant about not adding more medications.  With her current regimen and allergies, about the only thing left to try would be a low dose of chlorthalidone.  Her CrCl is about 28, so would still be able to use, though I would recommend starting at 12.5 mg daily.  At this time she is content to not add this in and is aware that her BP is not at the goal Dr. Debara Pickett would like.  I did ask that she switch her amlodipine to mornings and doxazosin to nights.  It could be that some of her daytime dizziness is more associated with the doxazosin than anything else.  I gave her my direct phone number and asked that she consider taking the chlorthalidone, and to call if she would be willing.     Tommy Medal PharmD CPP [facility]
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
## 2017-01-10 ENCOUNTER — Ambulatory Visit (INDEPENDENT_AMBULATORY_CARE_PROVIDER_SITE_OTHER): Payer: Medicare Other | Admitting: Neurology

## 2017-01-10 ENCOUNTER — Encounter: Payer: Self-pay | Admitting: Neurology

## 2017-01-10 VITALS — BP 122/74 | HR 78 | Ht 60.0 in | Wt 136.0 lb

## 2017-01-10 DIAGNOSIS — M792 Neuralgia and neuritis, unspecified: Secondary | ICD-10-CM | POA: Diagnosis not present

## 2017-01-10 DIAGNOSIS — R413 Other amnesia: Secondary | ICD-10-CM | POA: Diagnosis not present

## 2017-01-10 DIAGNOSIS — R0989 Other specified symptoms and signs involving the circulatory and respiratory systems: Secondary | ICD-10-CM

## 2017-01-10 DIAGNOSIS — G251 Drug-induced tremor: Secondary | ICD-10-CM | POA: Diagnosis not present

## 2017-01-10 DIAGNOSIS — E1142 Type 2 diabetes mellitus with diabetic polyneuropathy: Secondary | ICD-10-CM

## 2017-01-10 NOTE — Patient Instructions (Signed)
1. We will send a referral to [facility] for physical therapy  2. We have you scheduled at [facility] on Wednesday 01/12/17 at 2:00 pm to arrive at 1:30 pm for your Carotid Ultrasound. Please call [phone] opt [phone] if you need to reschedule.   3. Follow up as needed.

## 2017-01-14 ENCOUNTER — Telehealth: Payer: Self-pay | Admitting: Neurology

## 2017-01-14 ENCOUNTER — Encounter: Payer: Self-pay | Admitting: Neurology

## 2017-01-14 NOTE — Telephone Encounter (Signed)
Last office note faxed to [facility].

## 2017-01-14 NOTE — Telephone Encounter (Signed)
The patient knows that I got her carotid ultrasound from 01/11/2017.  It stated that there was minimal to moderate amount of bilateral plaque, left greater than right, but no hemodynamically significant stenosis in either internal carotid artery.

## 2017-01-14 NOTE — Telephone Encounter (Signed)
The office is needing notes from when the patient saw Dr. Carles Collet. The fax # [phone]. She will see them on Monday 01/17/17. Thanks

## 2017-01-17 NOTE — Telephone Encounter (Signed)
Patient made aware of results.  

## 2017-02-03 ENCOUNTER — Telehealth: Payer: Self-pay | Admitting: Internal Medicine

## 2017-02-03 NOTE — Telephone Encounter (Signed)
Pt wants to know if she can increase her fluid medicine,she says is retaining a lot of fluid.

## 2017-02-03 NOTE — Telephone Encounter (Signed)
Returned call to patient she stated for the past 3 days she has gained 3 lbs.Stated she has increased swelling in lower legs and feet.No sob.Advised I will send message to Dr.Hilty for advice.

## 2017-02-04 NOTE — Telephone Encounter (Signed)
Returned call to patient Dr.Hilty's recommendations given.Advised to call back if needed.

## 2017-02-04 NOTE — Telephone Encounter (Signed)
She can take 20 mg BID for 3 days, then back to 20 mg daily if the weight comes back down.  Dr. Lemmie Evens

## 2017-02-22 ENCOUNTER — Ambulatory Visit (INDEPENDENT_AMBULATORY_CARE_PROVIDER_SITE_OTHER): Payer: Medicare Other | Admitting: *Deleted

## 2017-02-22 DIAGNOSIS — I495 Sick sinus syndrome: Secondary | ICD-10-CM | POA: Diagnosis not present

## 2017-02-22 NOTE — Progress Notes (Signed)
Remote pacemaker transmission.   

## 2017-02-23 ENCOUNTER — Encounter: Payer: Self-pay | Admitting: Cardiology

## 2017-02-23 LAB — CUP PACEART REMOTE DEVICE CHECK
Battery Remaining Longevity: 113 mo
Battery Remaining Percentage: 95.5 %
Battery Voltage: 3.01 V
Brady Statistic AP VP Percent: 63 %
Brady Statistic AP VS Percent: 36 %
Brady Statistic AS VP Percent: 1 %
Brady Statistic AS VS Percent: 1 %
Brady Statistic RA Percent Paced: 99 %
Brady Statistic RV Percent Paced: 64 %
Date Time Interrogation Session: 20180501070749
Implantable Lead Implant Date: 20171019
Implantable Lead Implant Date: 20171019
Implantable Lead Location: 753859
Implantable Lead Location: 753860
Implantable Pulse Generator Implant Date: 20171019
Lead Channel Impedance Value: 440 Ohm
Lead Channel Impedance Value: 800 Ohm
Lead Channel Pacing Threshold Amplitude: 0.5 V
Lead Channel Pacing Threshold Amplitude: 1 V
Lead Channel Pacing Threshold Pulse Width: 0.4 ms
Lead Channel Pacing Threshold Pulse Width: 0.4 ms
Lead Channel Sensing Intrinsic Amplitude: 1.2 mV
Lead Channel Sensing Intrinsic Amplitude: 12 mV
Lead Channel Setting Pacing Amplitude: 2 V
Lead Channel Setting Pacing Amplitude: 2.5 V
Lead Channel Setting Pacing Pulse Width: 0.4 ms
Lead Channel Setting Sensing Sensitivity: 2 mV
Pulse Gen Model: 2272
Pulse Gen Serial Number: 3180458

## 2017-03-07 ENCOUNTER — Ambulatory Visit: Payer: Medicare Other | Admitting: Internal Medicine

## 2017-03-24 ENCOUNTER — Ambulatory Visit (INDEPENDENT_AMBULATORY_CARE_PROVIDER_SITE_OTHER): Payer: Medicare Other | Admitting: Pharmacist

## 2017-03-24 VITALS — BP 144/52 | HR 65

## 2017-03-24 DIAGNOSIS — I10 Essential (primary) hypertension: Secondary | ICD-10-CM | POA: Diagnosis not present

## 2017-03-24 NOTE — Patient Instructions (Addendum)
Return for a a follow up appointment in 4 weeks (Dr Debara Pickett as previously schedule)  Your blood pressure today is 144/52 pulse 65  Check your blood pressure at home daily (if able) and keep record of the readings.  Take your BP meds as follows: **ALL medication as prescribed** **May be use probiotic daily if/as needed for 1 week**  Bring your record of home blood pressures to your next appointment.  Exercise as you're able, try to walk approximately 30 minutes per day.  Keep salt intake to a minimum, especially watch canned and prepared boxed foods.  Eat more fresh fruits and vegetables and fewer canned items.  Avoid eating in fast food restaurants.    HOW TO TAKE YOUR BLOOD PRESSURE: . Rest 5 minutes before taking your blood pressure. .  Don't smoke or drink caffeinated beverages for at least 30 minutes before. . Take your blood pressure before (not after) you eat. . Sit comfortably with your back supported and both feet on the floor (don't cross your legs). . Elevate your arm to heart level on a table or a desk. . Use the proper sized cuff. It should fit smoothly and snugly around your bare upper arm. There should be enough room to slip a fingertip under the cuff. The bottom edge of the cuff should be 1 inch above the crease of the elbow. . Ideally, take 3 measurements at one sitting and record the average.

## 2017-03-24 NOTE — Progress Notes (Signed)
Patient ID: [name]                 DOB: Feb 14, 1941                      MRN: [id number]     HPI: [name] is a 76 y.o. female referred by Dr. Debara Pickett to HTN clinic. PMH includes CABG x 3 in 4580, stage 1 diastolic dysfunction, normal EF, bradycardia s/p pacemaker placement, and atrial flutter. Also suffers from vertigo caused by inner ear problem.    Presents today to clinic accompany by daughter and denies any falls, chest pain, increase swelling or shortness of breath. Report increased dizziness every time her BP drops to 998P systolic and recent on and off episodes of diarrhea.   Current HTN meds:  Amlodipine 10mg  daily Carvedilol 6.25mg  twice daily Doxazosin 4mg  daily  Furosemide 20mg  daily  BP goal: 140/90  (will avoid target BP of <130/80 due to increase dizziness with lower BP and chronic inner ear problems)  Family History:    Mother - hypertension, stroke at 67, died about 1 year later             Father heart disease (died from MI around age 86)             59 siblings - 63/5 brothers died from MI, at least 2 of those were under 54 years old                         63 sisters - 2 with CABG, in addition to patient             2 kids with no heart disease to date  Social History: No tobacco; no alcohol   Diet: decaf coffee - 1 cup daily, no soda except rarely;No added salt to foods; eating out 1-2 times per week, admits to liking fried fish (with recent recurrent episodes of diarrhea patient appetite is significantly decreased)  Exercise: Activity of daily living; occasional yard work  Home BP readings:30 reading;146/54 average (all reading in AM before medication administration); pulse 60-81  Wt Readings from Last 3 Encounters:  01/10/17 136 lb (61.7 kg)  01/06/17 132 lb 9.6 oz (60.1 kg)  12/23/16 135 lb 9.6 oz (61.5 kg)   BP Readings from Last 3 Encounters:  03/24/17 (!) 144/52  01/10/17 122/74  01/06/17 (!) 152/60   Pulse Readings from Last 3 Encounters:   03/24/17 65  01/10/17 78  01/06/17 68    Renal function: CrCl cannot be calculated (Patient's most recent lab result is older than the maximum 21 days allowed.).  Past Medical History:  Diagnosis Date  . Arthritis    "in my hands"  . Carotid artery disease ([place])    a. 40-59% bilaterally 10/2015.  Marland Kitchen Chronic diastolic CHF (congestive heart failure) ([place])    a. 05/2016 Echo: EF 60-65%, no rwma, Gr1 DD, Ao sclerosis w/o stenosis, triv MR;  b. 07/2016 TEE: EF 55-60%, no rwma, mild MR.  . CKD (chronic kidney disease), stage III   . Coronary artery disease    a. 02/2007 Persantine MV: low risk;  b. 11/2011 CABG x 3 (LIMA->LAD, VG->OM, VG->RCA);  c. 05/2016 MV: EF >65%, no isch/infarct, horiz ST dep in I, II, V5-V6.  Marland Kitchen Depression   . Diabetes mellitus   . Diverticulosis   . Esophageal stricture   . GERD (gastroesophageal reflux disease)   . Hemorrhoids   .  Hiatal hernia   . Hyperkalemia    a. ARB stopped due to this.  . Hyperlipidemia   . Hypertensive heart disease   . Hypothyroidism   . Mild cognitive impairment    a. seen by neurology.  Marland Kitchen PAF (paroxysmal atrial fibrillation) ([facility])    a. post-op CABG.  . Pain    RIGHT KNEE PAIN - TORN RIGHT MEDIAL MENISCUS  . Paroxysmal atrial flutter ([place])    a. 07/2016 s/p TEE & DCCV;  b. 07/2016 Recurrent PAFlutter req initiation of amio & PPM in setting of tachy-brady;  c. CHA2DS2VASc = 7-->Xarelto 15 mg QD.  Marland Kitchen PONV (postoperative nausea and vomiting)   . S/P CABG (coronary artery bypass graft), 12/04/11 12/07/2011   LIMA to LAD, SVG to OM, SVG to RCA  . Sinus bradycardia    a. not on BB due to this.  . Tachy-brady syndrome ([place])    a. 07/2016 Jxnl brady following DCCV, recurrent Aflutter-->amio + SJM 2272 [employer]urity MRI DC PPM (ser # [id number]).    Current Outpatient Prescriptions on File Prior to Visit  Medication Sig Dispense Refill  . amiodarone (PACERONE) 200 MG tablet Take 0.5 tablets (100 mg total) by mouth daily. 30 tablet 6  .  amLODipine (NORVASC) 10 MG tablet TAKE ONE TABLET BY MOUTH ONCE DAILY IN THE MORNING 30 tablet 11  . aspirin 81 MG chewable tablet Chew 1 tablet (81 mg total) by mouth daily. 30 tablet 1  . carvedilol (COREG) 6.25 MG tablet Take 1 tablet (6.25 mg total) by mouth 2 (two) times daily. 60 tablet 5  . Cholecalciferol (VITAMIN D3 PO) Take 1 capsule by mouth daily.    . Cyanocobalamin (VITAMIN B-12 PO) Take 1 tablet by mouth daily.    Marland Kitchen doxazosin (CARDURA) 4 MG tablet Take 1 tablet (4 mg total) by mouth daily. 30 tablet 6  . esomeprazole (NEXIUM) 20 MG capsule TAKE ONE CAPSULE BY MOUTH ONCE DAILY AT  12  NOON 90 capsule 1  . furosemide (LASIX) 20 MG tablet Take 20 mg by mouth daily.     Marland Kitchen gabapentin (NEURONTIN) 300 MG capsule Take 300-600 mg by mouth See admin instructions. Take 300 mg by mouth in the morning and take 600 mg by mouth at bedtime    . insulin glargine (LANTUS) 100 UNIT/ML injection Inject 14 Units into the skin every morning.     Marland Kitchen levothyroxine (SYNTHROID) 88 MCG tablet Take 1 tablet by mouth daily.    . meclizine (ANTIVERT) 25 MG tablet Take 25 mg by mouth daily as needed for dizziness. For dizziness    . Multiple Vitamins-Minerals (PRESERVISION AREDS 2 PO) Take 1 tablet by mouth daily.    Vladimir Faster Glycol-Propyl Glycol (SYSTANE OP) Place 1 drop into both eyes 2 (two) times daily.     . Rivaroxaban (XARELTO) 15 MG TABS tablet Take 1 tablet (15 mg total) by mouth daily with supper. 30 tablet 12  . sitaGLIPtin-metformin (JANUMET) 50-1000 MG per tablet Take 1 tablet by mouth 2 (two) times daily with a meal.    . vitamin C (ASCORBIC ACID) 500 MG tablet Take 500 mg by mouth daily.    Marland Kitchen VITAMIN E PO Take 1 capsule by mouth daily.     No current facility-administered medications on file prior to visit.     Allergies  Allergen Reactions  . Clonidine Derivatives Other (See Comments)    Bradycardia and fatigue   . Crestor [Rosuvastatin Calcium] Other (See Comments)    Other  reaction(s): tired and weak  . Epinephrine Other (See Comments)    Abnormal feeling. Dental exam/injection of local w/ epi.  Marland Kitchen Hydralazine Other (See Comments)    Nausea/gi upset   . Losartan Other (See Comments)    Hyperkalemia   . Other Other (See Comments)    MANGO'S - WHELPS ALL OVER  . Sulfa Antibiotics Other (See Comments)    Unknown    Blood pressure (!) 144/52, pulse 65. (BP drops to 138/52 upon standing)  Essential hypertension:  Blood pressure today remains slightly above desire goal of <140/90 but patient experiencing and increase in dizziness and fatigue with BP in 179X systolic.  Also noted patient had multiple episodes of diarrhea recently and may be slightly dehydrated. Will repeat BMET today, continue therapy without changes, and follow up in 4 weeks with cardiologist as previously scheduled.  Patient encouraged to monitor BP daily 1-2 hours after medication administration and keep records to bring to next office visit. She is to increase probiotics in her diet and contact PCP if GI issues not resolved within a week.  Raquel Rodriguez-Guzman PharmD, [place] [id number] 03/24/2017 3:02 PM

## 2017-03-25 ENCOUNTER — Telehealth: Payer: Self-pay | Admitting: Internal Medicine

## 2017-03-25 ENCOUNTER — Telehealth: Payer: Self-pay | Admitting: Pharmacist

## 2017-03-25 DIAGNOSIS — I5032 Chronic diastolic (congestive) heart failure: Secondary | ICD-10-CM

## 2017-03-25 DIAGNOSIS — I10 Essential (primary) hypertension: Secondary | ICD-10-CM

## 2017-03-25 LAB — BASIC METABOLIC PANEL
BUN/Creatinine Ratio: 13 (ref 12–28)
BUN: 33 mg/dL — ABNORMAL HIGH (ref 8–27)
CO2: 21 mmol/L (ref 18–29)
Calcium: 9.1 mg/dL (ref 8.7–10.3)
Chloride: 105 mmol/L (ref 96–106)
Creatinine, Ser: 2.45 mg/dL — ABNORMAL HIGH (ref 0.57–1.00)
GFR calc Af Amer: 21 mL/min/{1.73_m2} — ABNORMAL LOW (ref >59)
GFR calc non Af Amer: 18 mL/min/{1.73_m2} — ABNORMAL LOW (ref >59)
Glucose: 91 mg/dL (ref 65–99)
Potassium: 5.7 mmol/L — ABNORMAL HIGH (ref 3.5–5.2)
Sodium: 141 mmol/L (ref 134–144)

## 2017-03-25 NOTE — Telephone Encounter (Signed)
Talked to daughter today. She is concerned about holding the laxis for a long time but will hold during the weekend and call back if swelling or SOB noted.   Agree on coming next Tuesday to repeat BMET.

## 2017-03-25 NOTE — Telephone Encounter (Signed)
-----   Message from [name], MD sent at 03/25/2017  8:21 AM EDT ----- Creatinine significantly declined compared to 6 months ago. STOP lasix - encourage po fluids. Repeat BMET in 1 week.  Dr. Lemmie Evens

## 2017-03-25 NOTE — Telephone Encounter (Signed)
New Message     Please call the daughter and go over all the information that you went over with with patient

## 2017-03-25 NOTE — Telephone Encounter (Signed)
Talked to patient today. Notify results and MD recommendations.  Patient is concerned about holding furosemide for a week. Risk of kidney problems  explained to patient and she agreed to hold furosemide for now and come next week to repeat BMP.  I offered to call daughter to confirm they can come next Thursday or Friday , but patient prefer to notify her daughter herself.

## 2017-03-27 ENCOUNTER — Other Ambulatory Visit: Payer: Self-pay | Admitting: Nurse Practitioner

## 2017-03-28 NOTE — Telephone Encounter (Signed)
Rx request sent to pharmacy.  

## 2017-03-30 ENCOUNTER — Other Ambulatory Visit: Payer: Self-pay | Admitting: Internal Medicine

## 2017-03-31 ENCOUNTER — Telehealth: Payer: Self-pay | Admitting: Internal Medicine

## 2017-03-31 DIAGNOSIS — Z79899 Other long term (current) drug therapy: Secondary | ICD-10-CM

## 2017-03-31 DIAGNOSIS — E875 Hyperkalemia: Secondary | ICD-10-CM

## 2017-03-31 DIAGNOSIS — N289 Disorder of kidney and ureter, unspecified: Secondary | ICD-10-CM

## 2017-03-31 LAB — BASIC METABOLIC PANEL
BUN/Creatinine Ratio: 15 (ref 12–28)
BUN: 28 mg/dL — ABNORMAL HIGH (ref 8–27)
CO2: 21 mmol/L (ref 18–29)
Calcium: 8.7 mg/dL (ref 8.7–10.3)
Chloride: 108 mmol/L — ABNORMAL HIGH (ref 96–106)
Creatinine, Ser: 1.9 mg/dL — ABNORMAL HIGH (ref 0.57–1.00)
GFR calc Af Amer: 29 mL/min/{1.73_m2} — ABNORMAL LOW (ref >59)
GFR calc non Af Amer: 25 mL/min/{1.73_m2} — ABNORMAL LOW (ref >59)
Glucose: 98 mg/dL (ref 65–99)
Potassium: 6 mmol/L — ABNORMAL HIGH (ref 3.5–5.2)
Sodium: 142 mmol/L (ref 134–144)

## 2017-03-31 MED ORDER — CHLORTHALIDONE 25 MG PO TABS: 25.0000 mg | ORAL_TABLET | Freq: Every day | ORAL | 3 refills | Status: DC

## 2017-03-31 NOTE — Telephone Encounter (Signed)
Notes recorded by Sanda Klein, MD on 03/31/2017 at 8:31 AM EDT Looked through the records, [place]. Not sure what's going on. I wonder if she has renal tubular acidosis type IV from her diabetes. CO2 not particularly low, though. I think it would be wise for [place] to send her to see a nephrologist at [facility]. Please try to make them understand we want her evaluated especially because of hyperkalemia so they don't put on a long waiting list based on her creatinine. Renal function also borderline for use of Xarelto. Please recheck BMET in another 7 days. If her creatinine is still elevated over roughly 1.6 will have to switch to an alternative anticoagulant.  Returned call to daughter [name] Patient is having swelling Shortness of breath when walking in home Patient endorses weight gain (4-7lbs) Patient has been off lasix for only 4-5 days (at time of labs were drawn) Patient has had "boughts of diarrhea" and there were concerns of dehydration per daughter Explained lab results and need for nephrologist referral which was agreed upon Patient will have repeat BMET in 1 week - order placed   Called made to [facility] for new patient appointment They will need a referral packet - staff is faxing over info that details what information is required.

## 2017-03-31 NOTE — Telephone Encounter (Signed)
Per Simpson @ [phone]  "Pt have had a weight gain of 8lbs in 5 days,some Shortness of Breath,Worsening of Edema and Fatigued.Please call pt to advise at [phone].."  Routed to MD covering for Dr. Debara Pickett - Dr. Sallyanne Kuster Have already spoken w/patient's daughter [name] this AM regarding same issues Lasix on hold d/t renal fxn Referral to nephrology pending

## 2017-03-31 NOTE — Telephone Encounter (Signed)
Referral has been ordered and [name] has sent referral info to [facility] Per [name]: "they will have to look at her records, give her a number between 1 and 4 determining how bad she is and then schedule an appointment from there and this could take a few months but i hang on to the records until i hear the patient has an appointment."

## 2017-03-31 NOTE — Telephone Encounter (Signed)
New Message ° ° pt daughter verbalized that she is returning call for rn  °

## 2017-03-31 NOTE — Telephone Encounter (Signed)
Had a brief phone consultation with Dr. Joelyn Oms, Nephrology. We discussed the patient's labs and as much of the clinical scenario that I could relate based on the records and phone conversations. He agrees that she could have type IV RTA. Recommended that we start chlorthalidone 25 mg daily. This will definitely help improve the hyperkalemia and might be enough to help with the volume overload as well. Still need to go ahead with repeat labs in a week. If her swelling and shortness of breath do not improve, may also need to add more potent diuretics such as restarting furosemide. Please call on-call-physician over the weekend if shortness of breath worsens.

## 2017-03-31 NOTE — Telephone Encounter (Signed)
LM for daughter to call back  Daughter returned call Advised her on Dr. Lurline Del recommendations She voiced understanding however stated it can be difficult to start new meds as patient is sensitive to meds She will inform patient of med changes and I will call patient w/instructions as well  Patient called w/lab results and MD recommendations. She voiced understanding.

## 2017-03-31 NOTE — Telephone Encounter (Signed)
Note copied in a separate telephone encounter where it is noted that issues were discussed w/daughter. This encounter will be closed as it is a duplicate

## 2017-03-31 NOTE — Telephone Encounter (Signed)
Pt have had a weight gain of 8lbs in 5 days,some Shortness of Breath,Worsening of Edema and Fatigued.Please call pt to advise at [phone].Marland Kitchen

## 2017-04-06 NOTE — Telephone Encounter (Signed)
patient is scheduled to see Dr. Moshe Cipro on 04-11-17 at 10:45 a.m.

## 2017-04-07 ENCOUNTER — Telehealth: Payer: Self-pay | Admitting: Internal Medicine

## 2017-04-07 NOTE — Telephone Encounter (Signed)
Spoke with Charletta Cousin she states that pt has increased swelling she states that she is up about 10# she started chlorthalidone and states that this is not helping with the water retention. She states that she was up about 6-7# when starting chlorthalidone and is now up additional 2-3 lb's she states that pt has increased SOB, fatigue and her feet are sore from the swelling, denies any other sx dizziness, chest pain or pressure, etc.... She states that she does not take her BP at home. See previous tele-notes in chart. States that she is taking all other medications as ordered. Pt has appt with nephrologist 04-11-17. Any other directions for pt/daughter? Please advise

## 2017-04-07 NOTE — Telephone Encounter (Signed)
Scheduled appt for tomorrow with Hao-pa

## 2017-04-07 NOTE — Telephone Encounter (Signed)
If the swelling is worse, can try to get appointment with one of our APP's this week since she doesn't see the nephrologist until next week.  Dr. Lemmie Evens

## 2017-04-07 NOTE — Telephone Encounter (Signed)
Pt's dtr calling wanting appt today-lots of swelling feet and ankles, wt gain -not sure amount-not on Lasix due to kidney function-started a new fluid pill on Sunday per Nephrologist    -SOB increased  pls advise [phone] [facility] dtr

## 2017-04-08 ENCOUNTER — Ambulatory Visit (INDEPENDENT_AMBULATORY_CARE_PROVIDER_SITE_OTHER): Payer: Medicare Other | Admitting: Physician Assistant

## 2017-04-08 ENCOUNTER — Encounter: Payer: Self-pay | Admitting: Physician Assistant

## 2017-04-08 VITALS — BP 120/68 | HR 60 | Ht 60.0 in | Wt 141.4 lb

## 2017-04-08 DIAGNOSIS — I5033 Acute on chronic diastolic (congestive) heart failure: Secondary | ICD-10-CM

## 2017-04-08 DIAGNOSIS — N183 Chronic kidney disease, stage 3 unspecified: Secondary | ICD-10-CM

## 2017-04-08 DIAGNOSIS — I480 Paroxysmal atrial fibrillation: Secondary | ICD-10-CM

## 2017-04-08 DIAGNOSIS — I10 Essential (primary) hypertension: Secondary | ICD-10-CM

## 2017-04-08 DIAGNOSIS — I25810 Atherosclerosis of coronary artery bypass graft(s) without angina pectoris: Secondary | ICD-10-CM | POA: Diagnosis not present

## 2017-04-08 DIAGNOSIS — Z950 Presence of cardiac pacemaker: Secondary | ICD-10-CM | POA: Diagnosis not present

## 2017-04-08 DIAGNOSIS — E785 Hyperlipidemia, unspecified: Secondary | ICD-10-CM

## 2017-04-08 DIAGNOSIS — E119 Type 2 diabetes mellitus without complications: Secondary | ICD-10-CM | POA: Diagnosis not present

## 2017-04-08 DIAGNOSIS — E039 Hypothyroidism, unspecified: Secondary | ICD-10-CM

## 2017-04-08 NOTE — Patient Instructions (Addendum)
Medication change   STOP CHLORTHALIDONE    RESTART LASIX 20 MG ONE TABLET DAILY    LABS- BASIC METABOLIC PANEL (SERIAL) -Monday  June 18 ,2018 at [place]   -Monday June 25 , 2018 at [facility]   please have office fax results to [phone] attn [place]     Your physician recommends that you schedule a follow-up appointment in [place]. PATIENT HAS  AN APPONTMENT June 29 ,2018

## 2017-04-08 NOTE — Progress Notes (Signed)
Cardiology Office Note    Date:  04/09/2017   ID:  [name], DOB 03-Aug-1941, MRN [id number]  PCP:  Leeroy Cha, MD  Cardiologist:  Dr. Debara Pickett  Chief Complaint  Patient presents with  . Follow-up    seen for Dr. Debara Pickett    History of Present Illness:  [name] is a 76 y.o. female with PMH of CAD s/p CABG x 3 in 2013 (LIMA to LAD, SVG to OM, SVG to RCA), CKD stage III, DM II, hypertension, hyperlipidemia, hypothyroidism, PAF/flutter and history of tachybradycardia syndrome s/p St Jude PPM. She had a history of dizziness with Invokana. Last echocardiogram obtained on 05/25/2016 showed EF 65-78%, grade 1 diastolic dysfunction. She also had a negative Myoview on 06/03/2016, EF was 73%, normal perfusion, EKG portion did show ST depression in lead 1, 2, V5 and V6. She had a TEE cardioversion on 08/11/2016, after cardioversion, she developed significant sinus bradycardia with occasional junctional beat. Cardizem was discontinued. She was continued on Xarelto. Due to persistent bradycardia problem, she underwent [place] MRI compatible dual-chamber pacemaker by Dr. Curt Bears on 08/12/2016.   He has been having some issues with high blood pressure, he has multiple intolerances to blood pressure medication. He also has tremors related to amiodarone. Amiodarone dose was cut back. Her last office visit with Dr. Debara Pickett was on 12/23/2016, her carvedilol was increased to 6.25 mg twice a day. She was referred to the hypertension clinic. It sounds like she has some increased dizziness every time her blood pressure dropped down to 120s. She was kept on amlodipine 10 minute one daily, carvedilol 6.25 mg twice a day, doxazosin 4 mg daily and Lasix 20 mg daily.  She is accompanied by her daughter today who is a Marine scientist at [employer]. She will establish with Dr. Gerarda Fraction next Monday. She has at least 2-3+ pitting edema in bilateral lower extremity. She says chlorthalidone  is no longer working for her. Her blood pressure is very well controlled. On physical exam, her longest clear he does not appear to have any sign of pulmonary edema. She says she has been experiencing more shortness of breath recently. With her recent renal function, I am hesitant to be too aggressive on diuresing her. I will stop the chlorthalidone for now and restart Lasix at 20 mg daily which used to be her previous diuretic dose. She will need a basic metabolic panel next Monday and also as a week after to monitor her renal function. Unfortunately with discontinuation of chlorthalidone, I would expect her blood pressure to increase again. We'll reassess on follow-up.   Past Medical History:  Diagnosis Date  . Arthritis    "in my hands"  . Carotid artery disease ([place])    a. 40-59% bilaterally 10/2015.  Marland Kitchen Chronic diastolic CHF (congestive heart failure) ([place])    a. 05/2016 Echo: EF 60-65%, no rwma, Gr1 DD, Ao sclerosis w/o stenosis, triv MR;  b. 07/2016 TEE: EF 55-60%, no rwma, mild MR.  . CKD (chronic kidney disease), stage III   . Coronary artery disease    a. 02/2007 [place] MV: low risk;  b. 11/2011 CABG x 3 (LIMA->LAD, VG->OM, VG->RCA);  c. 05/2016 MV: EF >65%, no isch/infarct, horiz ST dep in I, II, V5-V6.  Marland Kitchen Depression   . Diabetes mellitus   . Diverticulosis   . Esophageal stricture   . GERD (gastroesophageal reflux disease)   . Hemorrhoids   . Hiatal hernia   . Hyperkalemia  a. ARB stopped due to this.  . Hyperlipidemia   . Hypertensive heart disease   . Hypothyroidism   . Mild cognitive impairment    a. seen by neurology.  Marland Kitchen PAF (paroxysmal atrial fibrillation) (HCC)    a. post-op CABG.  . Pain    RIGHT KNEE PAIN - TORN RIGHT MEDIAL MENISCUS  . Paroxysmal atrial flutter ([place])    a. 07/2016 s/p TEE & DCCV;  b. 07/2016 Recurrent PAFlutter req initiation of amio & PPM in setting of tachy-brady;  c. CHA2DS2VASc = 7-->Xarelto 15 mg QD.  Marland Kitchen PONV (postoperative nausea and  vomiting)   . S/P CABG (coronary artery bypass graft), 12/04/11 12/07/2011   LIMA to LAD, SVG to OM, SVG to RCA  . Sinus bradycardia    a. not on BB due to this.  . Tachy-brady syndrome ([place])    a. 07/2016 Jxnl brady following DCCV, recurrent Aflutter-->amio + SJM 2272 Assurity MRI DC PPM (ser # [id number]).    Past Surgical History:  Procedure Laterality Date  . ABDOMINAL HYSTERECTOMY  1980's  . BACK SURGERY  2006   "cyst growing near my spine"  . CARDIAC CATHETERIZATION  12/02/2011   mild LV dysfunction with mod hypocontractility of mid-distal anterolateral wall; CAD w/ostial tapering of L Main with 50% diffuse ostial narrowing of LAD, 99% eccentric focal prox LAD stenosis followed by 70% prox LAD stenosis after 1st diag, 20% mid LAD narrowing; 80% ostial-to-prox L Cfx stenosis & 40-50% irregularity of RCA (Dr. Corky Downs)  . CARDIOVERSION N/A 08/11/2016   Procedure: CARDIOVERSION;  Surgeon: Lelon Perla, MD;  Location: [facility];  Service: Cardiovascular;  Laterality: N/A;  . CATARACT EXTRACTION W/ INTRAOCULAR LENS  IMPLANT, BILATERAL  ~ 2010  . [place]  . CORONARY ARTERY BYPASS GRAFT  12/04/2011   Procedure: CORONARY ARTERY BYPASS GRAFTING (CABG);  Surgeon: Tharon Aquas Adelene Idler, MD;  Location: [facility];  Service: Open Heart Surgery;  Laterality: N/A;  CABG x three,  using left internal mammary artery, and right leg greater saphenous vein harvested endoscopically  . DILATION AND CURETTAGE OF UTERUS     "a couple times"  . EP IMPLANTABLE DEVICE N/A 08/12/2016   Procedure: Pacemaker Implant;  Surgeon: Will Meredith Leeds, MD;  Location: [facility];  Service: Cardiovascular;  Laterality: N/A;  . FRACTURE SURGERY     "put pins both side right ankle"  . JOINT REPLACEMENT    . KNEE ARTHROSCOPY WITH MEDIAL MENISECTOMY Right 07/02/2014   Procedure: RIGHT KNEE ARTHROSCOPY WITH PARTIAL MEDIAL MENISTECTOMY, ABRASION CONDROPLASTYU OF PATELLA,ABRASION CONDROPLASTY OF MEDIAL FEMEROL  CONDYL, MICROFRACTURE OF MEDIAL FEMEROL CONDYL;  Surgeon: Tobi Bastos, MD;  Location: [facility];  Service: Orthopedics;  Laterality: Right;  . LEFT HEART CATHETERIZATION WITH CORONARY ANGIOGRAM N/A 12/02/2011   Procedure: LEFT HEART CATHETERIZATION WITH CORONARY ANGIOGRAM;  Surgeon: Troy Sine, MD;  [facility];  Service: Cardiovascular;  Laterality: N/A;  Coronary angiogram, possible PCI  . TEE WITHOUT CARDIOVERSION N/A 08/11/2016   Procedure: TRANSESOPHAGEAL ECHOCARDIOGRAM (TEE);  Surgeon: Lelon Perla, MD;  Location: [facility];  Service: Cardiovascular;  Laterality: N/A;  . TONSILLECTOMY  1949  . TOTAL KNEE ARTHROPLASTY  ~ 2006   left  . TRANSTHORACIC ECHOCARDIOGRAM  02/19/2013   EF 72-62%, grade 1 diastolic dysfunction; mildly thickend/calcified AV leaflets; mildly calcidied MV annulus; mild TR    Current Medications: Outpatient Medications Prior to Visit  Medication Sig Dispense Refill  . amiodarone (PACERONE) 200  MG tablet Take 0.5 tablets (100 mg total) by mouth daily. 30 tablet 6  . amLODipine (NORVASC) 10 MG tablet TAKE ONE TABLET BY MOUTH ONCE DAILY IN THE MORNING 30 tablet 11  . aspirin 81 MG chewable tablet Chew 1 tablet (81 mg total) by mouth daily. 30 tablet 1  . Cyanocobalamin (VITAMIN B-12 PO) Take 1 tablet by mouth daily.    Marland Kitchen doxazosin (CARDURA) 4 MG tablet TAKE ONE TABLET BY MOUTH ONCE DAILY 30 tablet 6  . esomeprazole (NEXIUM) 20 MG capsule TAKE ONE CAPSULE BY MOUTH ONCE DAILY AT  12  NOON 90 capsule 1  . furosemide (LASIX) 20 MG tablet Take 20 mg by mouth daily.    Marland Kitchen gabapentin (NEURONTIN) 300 MG capsule Take 300-600 mg by mouth See admin instructions. Take 300 mg by mouth in the morning and take 600 mg by mouth at bedtime    . insulin glargine (LANTUS) 100 UNIT/ML injection Inject 14 Units into the skin every morning.     Marland Kitchen levothyroxine (SYNTHROID) 88 MCG tablet Take 1 tablet by mouth daily.    . meclizine (ANTIVERT) 25 MG tablet Take 25 mg by mouth  daily as needed for dizziness. For dizziness    . Multiple Vitamins-Minerals (PRESERVISION AREDS 2 PO) Take 1 tablet by mouth daily.    Vladimir Faster Glycol-Propyl Glycol (SYSTANE OP) Place 1 drop into both eyes 2 (two) times daily.     . Rivaroxaban (XARELTO) 15 MG TABS tablet Take 1 tablet (15 mg total) by mouth daily with supper. 30 tablet 12  . sitaGLIPtin-metformin (JANUMET) 50-1000 MG per tablet Take 1 tablet by mouth 2 (two) times daily with a meal.    . chlorthalidone (HYGROTON) 25 MG tablet Take 1 tablet (25 mg total) by mouth daily. 30 tablet 3  . carvedilol (COREG) 6.25 MG tablet Take 1 tablet (6.25 mg total) by mouth 2 (two) times daily. 60 tablet 5   No facility-administered medications prior to visit.      Allergies:   Clonidine derivatives; Crestor [rosuvastatin calcium]; Epinephrine; Hydralazine; Losartan; Other; and Sulfa antibiotics   Social History   Social History  . Marital status: Widowed    Spouse name: N/A  . Number of children: 2  . Years of education: 12   Occupational History  . retired    Social History Main Topics  . Smoking status: Never Smoker  . Smokeless tobacco: Never Used  . Alcohol use 0.0 oz/week     Comment: very occasional   . Drug use: No  . Sexual activity: No   Other Topics Concern  . None   Social History Narrative  . None     Family History:  The patient's family history includes Breast cancer in her sister and sister; CVA in her mother; Diabetes in her mother and sister; Heart disease in her brother, father, and sister; Hyperlipidemia in her father; Hypertension in her mother; Lung cancer in her sister.   ROS:   Please see the history of present illness.    ROS All other systems reviewed and are negative.   PHYSICAL EXAM:   VS:  BP 120/68 (BP Location: Left Arm)   Pulse 60   Ht 5' (1.524 m)   Wt 141 lb 6.4 oz (64.1 kg)   BMI 27.62 kg/m    GEN: Well nourished, well developed, in no acute distress  HEENT: normal  Neck: no  JVD, carotid bruits, or masses Cardiac: RRR; no murmurs, rubs, or gallops.  2-3+  edema  Respiratory:  clear to auscultation bilaterally, normal work of breathing GI: soft, nontender, nondistended, + BS MS: no deformity or atrophy  Skin: warm and dry, no rash Neuro:  Alert and Oriented x 3, Strength and sensation are intact Psych: euthymic mood, full affect  Wt Readings from Last 3 Encounters:  04/08/17 141 lb 6.4 oz (64.1 kg)  01/10/17 136 lb (61.7 kg)  01/06/17 132 lb 9.6 oz (60.1 kg)      Studies/Labs Reviewed:   EKG:  EKG is ordered today.  The ekg ordered today demonstrates Atrial paced rhythm, T wave inversion in anterolateral leads and also inferior leads.  Recent Labs: 05/23/2016: B Natriuretic Peptide 359.7 08/14/2016: ALT 7 09/22/2016: Hemoglobin 9.9; Platelets 194; TSH 8.44 03/30/2017: BUN 28; Creatinine, Ser 1.90; Potassium 6.0; Sodium 142   Lipid Panel    Component Value Date/Time   CHOL 330 (H) 08/08/2016 2130   TRIG 313 (H) 08/08/2016 2130   HDL 42 08/08/2016 2130   CHOLHDL 7.9 08/08/2016 2130   VLDL 63 (H) 08/08/2016 2130   LDLCALC 225 (H) 08/08/2016 2130    Additional studies/ records that were reviewed today include:   Echo 05/25/2016 LV EF: 60% -   65%  Study Conclusions  - Left ventricle: The cavity size was normal. Wall thickness was   normal. Systolic function was normal. The estimated ejection   fraction was in the range of 60% to 65%. Wall motion was normal;   there were no regional wall motion abnormalities. Doppler   parameters are consistent with abnormal left ventricular   relaxation (grade 1 diastolic dysfunction). The E/e&' ratio is   between 8-15, suggesting indeterminate LV filling pressure. - Aortic valve: Trileaflet. Sclerosis without stenosis.   Transvalvular velocity was minimally increased. There was no   stenosis. There was no regurgitation. - Mitral valve: Mildly thickened leaflets . There was trivial   regurgitation. - Left  atrium: The atrium was normal in size. - Tricuspid valve: There was no significant regurgitation. - Inferior vena cava: The vessel was normal in size. The   respirophasic diameter changes were in the normal range (>= 50%),   consistent with normal central venous pressure.  Impressions:  - Compared to a prior echo in 2014, there are no significant   changes.   Myoview 06/03/2016 Study Highlights    Nuclear stress EF: 73%.  Horizontal ST segment depression ST segment depression of 1 mm was noted during stress in the I, II, V5 and V6 leads.  The study is normal.  This is a low risk study.  The left ventricular ejection fraction is hyperdynamic (>65%).    ICD 08/12/2016 CONCLUSIONS:   1. Successful implantation of a St Jude Medical Assurity MRI  dual-chamber pacemaker for tachy brady syndrome  2. Successful cardioversion from atrial flutter to sinus rhythm  3. No early apparent complications.        ASSESSMENT:    1. Acute on chronic diastolic heart failure ([place])   2. Coronary artery disease involving coronary bypass graft of native heart without angina pectoris   3. CKD (chronic kidney disease), stage III   4. Essential hypertension   5. Hyperlipidemia, unspecified hyperlipidemia type   6. Controlled type 2 diabetes mellitus without complication, without long-term current use of insulin ([place])   7. Hypothyroidism, unspecified type   8. Pacemaker   9. PAF (paroxysmal atrial fibrillation) (HCC)      PLAN:  In order of problems listed above:  1. Acute on chronic  diastolic heart failure: Has at least 2-3+ pitting edema in bilateral lower extremity on physical exam today. I will discontinue chlorthalidone and a switch to Lasix again at 20 mg daily. Hesitant to start at the higher dose due to recent worsening renal function. Basic metabolic panel obtained on 03/30/2017 showed renal function is improving. Apparently she had a lot of diarrhea recently which likely is  responsible for the worsening renal function as result of dehydration.  2. CAD s/p CABG: No obvious angina  3. Hypertension: Blood pressure has normalized, however current dose of chlorthalidone is not controlling her lower extremity edema, will stop the chlorthalidone and replace it with the previous dose of Lasix 20 mg daily. Unfortunately, this may cause her blood pressure to increase again. Will need further assessment on follow-up.  4. DM II: On insulin  5. Hypothyroidism: On Synthroid  6. Acute on chronic renal insufficiency: Recent basic metabolic panel shows creatinine improving. She will be set up with [facility] next Monday, she will need a basic metabolic panel next Monday and also the Monday afterward and the fact the result was.  7. PAF: On amiodarone, has feeling of shakiness on the higher dose of amiodarone. Continue Xarelto as well.    Medication Adjustments/Labs and Tests Ordered: Current medicines are reviewed at length with the patient today.  Concerns regarding medicines are outlined above.  Medication changes, Labs and Tests ordered today are listed in the Patient Instructions below. Patient Instructions  Medication change   STOP CHLORTHALIDONE    RESTART LASIX 20 MG ONE TABLET DAILY    LABS- BASIC METABOLIC PANEL (SERIAL) -Monday  June 18 ,2018 at [facility]   -Monday June 25 , 2018 at [facility]   please have office fax results to [phone] attn [facility]     Your physician recommends that you schedule a follow-up appointment in [place]. PATIENT HAS  AN APPONTMENT June 29 ,2018     Hilbert Corrigan, [place]  04/09/2017 3:12 PM    [facility] [address], [place], [place]  [id number] Phone: [phone]; Fax: [phone]

## 2017-04-09 ENCOUNTER — Encounter: Payer: Self-pay | Admitting: Physician Assistant

## 2017-04-12 NOTE — Telephone Encounter (Signed)
Patient seen by [place], [place] 04/08/17

## 2017-04-15 ENCOUNTER — Ambulatory Visit: Payer: Medicare Other | Admitting: Internal Medicine

## 2017-04-15 ENCOUNTER — Other Ambulatory Visit: Payer: Self-pay | Admitting: Nephrology

## 2017-04-15 DIAGNOSIS — N179 Acute kidney failure, unspecified: Secondary | ICD-10-CM

## 2017-04-15 DIAGNOSIS — N183 Chronic kidney disease, stage 3 unspecified: Secondary | ICD-10-CM

## 2017-04-22 ENCOUNTER — Encounter: Payer: Self-pay | Admitting: Internal Medicine

## 2017-04-22 ENCOUNTER — Ambulatory Visit (INDEPENDENT_AMBULATORY_CARE_PROVIDER_SITE_OTHER): Payer: Medicare Other | Admitting: Internal Medicine

## 2017-04-22 VITALS — BP 132/66 | HR 76 | Ht 60.0 in | Wt 137.6 lb

## 2017-04-22 DIAGNOSIS — Z7901 Long term (current) use of anticoagulants: Secondary | ICD-10-CM | POA: Insufficient documentation

## 2017-04-22 DIAGNOSIS — N183 Chronic kidney disease, stage 3 unspecified: Secondary | ICD-10-CM

## 2017-04-22 DIAGNOSIS — I5033 Acute on chronic diastolic (congestive) heart failure: Secondary | ICD-10-CM | POA: Diagnosis not present

## 2017-04-22 DIAGNOSIS — E038 Other specified hypothyroidism: Secondary | ICD-10-CM | POA: Diagnosis not present

## 2017-04-22 DIAGNOSIS — I480 Paroxysmal atrial fibrillation: Secondary | ICD-10-CM | POA: Diagnosis not present

## 2017-04-22 DIAGNOSIS — Z79899 Other long term (current) drug therapy: Secondary | ICD-10-CM

## 2017-04-22 NOTE — Progress Notes (Signed)
OFFICE NOTE  Chief Complaint:  Occasional shakiness, swelling  Primary Care Physician: Leeroy Cha, MD  HPI:  [name] is a pleasant 76 year old female with history of coronary disease and CABG x 3 vessels in 2013, LIMA to LAD, SVG to OM and SVG to right coronary. She underwent an echocardiogram which demonstrates normal EF of 55% to 65%. However, there is stage 1 diastolic dysfunction and evidence for elevated mean left atrial filling pressure. There were mildly thickened calcified aortic valve leaflets but no stenosis and mild tricuspid regurgitation. At the time, I started her on Lasix 20 mg daily. A BMP was obtained that day which indicated mild elevation at 75.5, creatinine was 1.16. She has been taking the Lasix with improvement in her lower extremity swelling although shortness of breath seems to be about the same. Again, she notes it is only with marked exertion, not necessarily with normal activities. However, she does have ongoing fatigue and easy fatigability. This may be due to just poor exercise tolerance. She also reported that recently you started her on a new SLGT2 diabetic medicine and she said that she took several doses of that, which I believe was Invokana, and she became markedly dizzy with that. She has since stopped that medication. Again, overall the patient is fairly stable with a normal ejection fraction. She recently had bypass and is not having any anginal symptoms and therefore I do not suspect that this is an issue related to her bypass grafts.   I saw [name] today in the office for an acute visit regarding what is described as abdominal pain. She reports a numbness and pain sensation over the right upper quadrant. She's also noted some firmness and distention of her abdomen. This is recently presented to a GI doctor in [place] who thought she might have a gastric ulcer. He performed endoscopy which was a reportedly unrevealing. He also  recommended she is take MiraLAX for constipation. She says that recently she's been constipated but also has been having some small amount of loose stool. She did not want to take the MiraLAX given the loose stool. She also reports some of the discomfort extends across the mid epigastrium and into the lower chest. It is entirely atypical for cardiac chest pain. She reports that she had a colonoscopy in the past by Dr. Henrene Pastor about 5 years ago and a repeat colonoscopy was recommended around 2018. She also tells me that she recently had a right upper quadrant ultrasound performed at [facility] after presenting with abdominal pain which was negative for acute biliary disease.  [name] returns today for follow-up. Overall she seems to be doing well from a cardiac standpoint. I understand she is switched primary care providers to [facility] primary care, however she is unclear of her primary care provider. She does have an upcoming appointment to see Jackelyn Knife neurology. Recently she's been having trouble with forgetfulness and it seems to be getting worse. She tells me she lost her cell phone at home and has not been able to locate it. She denies any chest pain or shortness of breath and in fact her flank back and abdominal pain which was evaluated extensively has now gone away.  I had the pleasure of seeing [name] back today in follow-up. It's been 6 months and she'll last appointment. Her main concern today is leg weakness. She reports she has some back pain and associated weakness in her legs that give out from  time to time. She recently had memory testing indicating possible dementia as well as likely depression. It was recommended that she go on therapy however she declined medications. She denies any chest pain or worsening shortness of breath.  07/01/2016  [name] returns today for follow-up. She was recently hospitalized for acute diastolic heart failure. She was treated with  diuretics and adjustments are made to her blood pressure medications. She had a repeat echocardiogram which was essentially stable showed normal LV EF and diastolic dysfunction. After discharge she underwent nuclear stress testing which was negative for ischemia. She saw Ignacia Bayley, NP, who made some changes to her blood pressure medications including placing her on clonidine. Since taking this medicine she has had some of the typical symptoms of fatigue and dry mouth as well as constipation. She was alert initially taking clonidine 2 mg twice daily. She then decreased it to one half tablet or 1 mg twice daily but blood pressure remains high. I checked her blood pressure manually today 160/60. She reports fatigue and low energy.  09/22/2016  [name] returns today for hospital follow-up. 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 She had a St. Jude surety MRI safe pacemaker placed. She was also started on amiodarone and underwent cardioversion. She seems to be maintaining sinus rhythm. She reports improvement in her energy. She recently saw Ignacia Bayley, NP, in follow-up who reported she was doing well although noted her blood pressure was more elevated. She was placed on doxazosin 4 mg daily and had initial improvement in blood pressure however blood pressure has since gone up. Today was 186/62. Unfortunately, she's been intolerant of a number of blood pressure medications in the past.  12/23/2016  [name] was seen in follow-up today. She saw Dr. candidates in January who noted that she reported being shaky on amiodarone and he decreased the dose down to 100 mg daily. She remains in an AV paced rhythm but there has been recurrent underlying atrial flutter. She is also had some fatigue. The pressure is elevated today 156/70. I reviewed home monitoring indicates blood pressures continue to be elevated. She seems to be okay with these  numbers but she is at increased risk for events. Unfortunate she's had intolerance to number of medications.  04/22/2017  [name] returns today for follow-up. She recently saw Almyra Deforest, PA-C, who noted that she had had some weight gain and lower extremity edema. He discontinued her chlorthalidone and put her on low-dose Lasix. She's had improvement in her swallowing with this and some weight loss. She then followed up with Dr. Moshe Cipro at [facility]. Her creatinine was noted to be 2.7 and she is undergoing a renal ultrasound and further workup for this. She was also noted to be anemic with a low iron saturation. She's been receiving some IV iron. She seems to be maintaining sinus rhythm on low-dose amiodarone. Recently she saw her endocrinologist who had increased her Synthroid. Her last TSH was between 7 and 8. She is due for repeat check of that. She has done well with the placement of a pacemaker in the fall. She has remote checks to follow that.  PMHx:  Past Medical History:  Diagnosis Date  . Arthritis    "in my hands"  . Carotid artery disease ([place])    a. 40-59% bilaterally 10/2015.  Marland Kitchen Chronic diastolic CHF (congestive heart failure) ([place])    a. 05/2016 Echo: EF 60-65%, no rwma, Gr1 DD,  Ao sclerosis w/o stenosis, triv MR;  b. 07/2016 TEE: EF 55-60%, no rwma, mild MR.  . CKD (chronic kidney disease), stage III   . Coronary artery disease    a. 02/2007 Persantine MV: low risk;  b. 11/2011 CABG x 3 (LIMA->LAD, VG->OM, VG->RCA);  c. 05/2016 MV: EF >65%, no isch/infarct, horiz ST dep in I, II, V5-V6.  Marland Kitchen Depression   . Diabetes mellitus   . Diverticulosis   . Esophageal stricture   . GERD (gastroesophageal reflux disease)   . Hemorrhoids   . Hiatal hernia   . Hyperkalemia    a. ARB stopped due to this.  . Hyperlipidemia   . Hypertensive heart disease   . Hypothyroidism   . Mild cognitive impairment    a. seen by neurology.  Marland Kitchen PAF (paroxysmal atrial fibrillation) (HCC)    a.  post-op CABG.  . Pain    RIGHT KNEE PAIN - TORN RIGHT MEDIAL MENISCUS  . Paroxysmal atrial flutter ([place])    a. 07/2016 s/p TEE & DCCV;  b. 07/2016 Recurrent PAFlutter req initiation of amio & PPM in setting of tachy-brady;  c. CHA2DS2VASc = 7-->Xarelto 15 mg QD.  Marland Kitchen PONV (postoperative nausea and vomiting)   . S/P CABG (coronary artery bypass graft), 12/04/11 12/07/2011   LIMA to LAD, SVG to OM, SVG to RCA  . Sinus bradycardia    a. not on BB due to this.  . Tachy-brady syndrome ([place])    a. 07/2016 Jxnl brady following DCCV, recurrent Aflutter-->amio + SJM 2272 Assurity MRI DC PPM (ser # [id number]).    Past Surgical History:  Procedure Laterality Date  . ABDOMINAL HYSTERECTOMY  1980's  . BACK SURGERY  2006   "cyst growing near my spine"  . CARDIAC CATHETERIZATION  12/02/2011   mild LV dysfunction with mod hypocontractility of mid-distal anterolateral wall; CAD w/ostial tapering of L [place] with 50% diffuse ostial narrowing of LAD, 99% eccentric focal prox LAD stenosis followed by 70% prox LAD stenosis after 1st diag, 20% mid LAD narrowing; 80% ostial-to-prox L Cfx stenosis & 40-50% irregularity of RCA (Dr. Corky Downs)  . CARDIOVERSION N/A 08/11/2016   Procedure: CARDIOVERSION;  Surgeon: Lelon Perla, MD;  Location: [place];  Service: Cardiovascular;  Laterality: N/A;  . CATARACT EXTRACTION W/ INTRAOCULAR LENS  IMPLANT, BILATERAL  ~ 2010  . [place]  . CORONARY ARTERY BYPASS GRAFT  12/04/2011   Procedure: CORONARY ARTERY BYPASS GRAFTING (CABG);  Surgeon: Tharon Aquas Adelene Idler, MD;  Location: [place];  Service: Open Heart Surgery;  Laterality: N/A;  CABG x three,  using left internal mammary artery, and right leg greater saphenous vein harvested endoscopically  . DILATION AND CURETTAGE OF UTERUS     "a couple times"  . EP IMPLANTABLE DEVICE N/A 08/12/2016   Procedure: Pacemaker Implant;  Surgeon: Will Meredith Leeds, MD;  Location: [place] [facility];  Service: Cardiovascular;   Laterality: N/A;  . FRACTURE SURGERY     "put pins both side right ankle"  . JOINT REPLACEMENT    . KNEE ARTHROSCOPY WITH MEDIAL MENISECTOMY Right 07/02/2014   Procedure: RIGHT KNEE ARTHROSCOPY WITH PARTIAL MEDIAL MENISTECTOMY, ABRASION CONDROPLASTYU OF PATELLA,ABRASION CONDROPLASTY OF MEDIAL FEMEROL CONDYL, MICROFRACTURE OF MEDIAL FEMEROL CONDYL;  Surgeon: Tobi Bastos, MD;  Location: [facility];  Service: Orthopedics;  Laterality: Right;  . LEFT HEART CATHETERIZATION WITH CORONARY ANGIOGRAM N/A 12/02/2011   Procedure: LEFT HEART CATHETERIZATION WITH CORONARY ANGIOGRAM;  Surgeon: Troy Sine, MD;  Location:  [place] [facility];  Service: Cardiovascular;  Laterality: N/A;  Coronary angiogram, possible PCI  . TEE WITHOUT CARDIOVERSION N/A 08/11/2016   Procedure: TRANSESOPHAGEAL ECHOCARDIOGRAM (TEE);  Surgeon: Lelon Perla, MD;  Location: [place];  Service: Cardiovascular;  Laterality: N/A;  . TONSILLECTOMY  1949  . TOTAL KNEE ARTHROPLASTY  ~ 2006   left  . TRANSTHORACIC ECHOCARDIOGRAM  02/19/2013   EF 73-71%, grade 1 diastolic dysfunction; mildly thickend/calcified AV leaflets; mildly calcidied MV annulus; mild TR    FAMHx:  Family History  Problem Relation Age of Onset  . Diabetes Mother   . CVA Mother   . Hypertension Mother   . Heart disease Father   . Hyperlipidemia Father   . Breast cancer Sister        x 3  . Heart disease Brother        x5; one with MI  . Heart disease Sister        x3  . Diabetes Sister        x3  . Lung cancer Sister   . Breast cancer Sister        x2  . Colon cancer Neg Hx     SOCHx:   reports that she has never smoked. She has never used smokeless tobacco. She reports that she drinks alcohol. She reports that she does not use drugs.  ALLERGIES:  Allergies  Allergen Reactions  . Clonidine Derivatives Other (See Comments)    Bradycardia and fatigue   . Crestor [Rosuvastatin Calcium] Other (See Comments)    Other reaction(s): tired and weak  .  Epinephrine Other (See Comments)    Abnormal feeling. Dental exam/injection of local w/ epi.  Marland Kitchen Hydralazine Other (See Comments)    Nausea/gi upset   . Losartan Other (See Comments)    Hyperkalemia   . Other Other (See Comments)    MANGO'S - WHELPS ALL OVER  . Sulfa Antibiotics Other (See Comments)    Unknown    ROS: Pertinent items noted in HPI and remainder of comprehensive ROS otherwise negative.  HOME MEDS: Current Outpatient Prescriptions  Medication Sig Dispense Refill  . amiodarone (PACERONE) 200 MG tablet Take 0.5 tablets (100 mg total) by mouth daily. 30 tablet 6  . amLODipine (NORVASC) 10 MG tablet Take 5 mg by mouth daily.    Marland Kitchen aspirin 81 MG chewable tablet Chew 1 tablet (81 mg total) by mouth daily. 30 tablet 1  . carvedilol (COREG) 6.25 MG tablet Take 1 tablet (6.25 mg total) by mouth 2 (two) times daily. 60 tablet 5  . Cyanocobalamin (VITAMIN B-12 PO) Take 1 tablet by mouth daily.    Marland Kitchen doxazosin (CARDURA) 4 MG tablet TAKE ONE TABLET BY MOUTH ONCE DAILY 30 tablet 6  . esomeprazole (NEXIUM) 20 MG capsule TAKE ONE CAPSULE BY MOUTH ONCE DAILY AT  12  NOON 90 capsule 1  . furosemide (LASIX) 20 MG tablet Take 20 mg by mouth daily.    Marland Kitchen gabapentin (NEURONTIN) 300 MG capsule Take 300-600 mg by mouth See admin instructions. Take 300 mg by mouth in the morning and take 600 mg by mouth at bedtime    . insulin glargine (LANTUS) 100 UNIT/ML injection Inject 14 Units into the skin every morning.     Marland Kitchen levothyroxine (SYNTHROID) 88 MCG tablet Take 1 tablet by mouth daily.    . meclizine (ANTIVERT) 25 MG tablet Take 25 mg by mouth daily as needed for dizziness. For dizziness    . Multiple Vitamins-Minerals (  PRESERVISION AREDS 2 PO) Take 1 tablet by mouth daily.    Vladimir Faster Glycol-Propyl Glycol (SYSTANE OP) Place 1 drop into both eyes 2 (two) times daily.     . Rivaroxaban (XARELTO) 15 MG TABS tablet Take 1 tablet (15 mg total) by mouth daily with supper. 30 tablet 12   No current  facility-administered medications for this visit.     LABS/IMAGING: No results found for this or any previous visit (from the past 48 hour(s)). No results found.  VITALS: BP 132/66   Pulse 76   Ht 5' (1.524 m)   Wt 137 lb 9.6 oz (62.4 kg)   BMI 26.87 kg/m   EXAM: General appearance: alert and no distress Neck: no carotid bruit and no JVD Lungs: clear to auscultation bilaterally Heart: regular rate and rhythm, S1, S2 normal, no murmur, click, rub or gallop Abdomen: soft, non-tender; bowel sounds normal; no masses,  no organomegaly Extremities: edema Trace to 1+ bilateral lower extremity edema Pulses: 2+ and symmetric Skin: Skin color, texture, turgor normal. No rashes or lesions Neurologic: Grossly normal  EKG: Deferred  ASSESSMENT: 1. Coronary artery disease status post CABG x 3 vessels in 2013 2. Paroxysmal atrial flutter-CHADSVASC score of 5 on Xarelto 3. Tachybradycardia syndrome status post St. Jude permanent pacemaker (07/2016) 4. Diabetes type 2 - now on insulin 5. Hypertension 6. Dyslipidemia 7. Peripheral neuropathy - secondary to diabetes 8. Progressive memory loss 9. Low back pain and leg weakness  PLAN: 1.   [name] seems to have had improvement in her swelling on Lasix. Her chlorthalidone was discontinued. She is now seeing Dr. Moshe Cipro with [facility]. Creatinine is 2.7. Workup is commencing. She is undergoing treatment for anemia. She recently had some lower blood pressure and her amlodipine was cut down to 5 mg daily. She seems to be doing well after pacemaker without recurrent atrial flutter. Plan to continue her current medications. Check her thyroid function and liver enzymes today as she is on amiodarone and has hypothyroidism with recent medication increase.  Follow-up 6 months.  [name], MD, [facility] Attending Cardiologist [place] 04/22/2017, 3:47 PM

## 2017-04-22 NOTE — Patient Instructions (Addendum)
Your physician recommends that you return for lab work TODAY - TSH, free T4, liver function   Your physician recommends that you continue on your current medications as directed. Please refer to the Current Medication list given to you today.  Your physician wants you to follow-up in: 6 months with Dr. Debara Pickett. You will receive a reminder letter in the mail two months in advance. If you don't receive a letter, please call our office to schedule the follow-up appointment.

## 2017-04-23 LAB — HEPATIC FUNCTION PANEL
ALT: 8 IU/L (ref 0–32)
AST: 16 IU/L (ref 0–40)
Albumin: 4.3 g/dL (ref 3.5–4.8)
Alkaline Phosphatase: 73 IU/L (ref 39–117)
Bilirubin Total: 0.3 mg/dL (ref 0.0–1.2)
Bilirubin, Direct: 0.09 mg/dL (ref 0.00–0.40)
Total Protein: 6.9 g/dL (ref 6.0–8.5)

## 2017-04-23 LAB — T4, FREE: Free T4: 1.44 ng/dL (ref 0.82–1.77)

## 2017-04-23 LAB — TSH: TSH: 3.98 u[IU]/mL (ref 0.450–4.500)

## 2017-04-29 ENCOUNTER — Other Ambulatory Visit: Payer: Medicare Other

## 2017-05-10 ENCOUNTER — Ambulatory Visit
Admission: RE | Admit: 2017-05-10 | Discharge: 2017-05-10 | Disposition: A | Discharge status: Home / Self Care | Payer: Medicare Other | Source: Ambulatory Visit | Attending: Nephrology | Admitting: Nephrology

## 2017-05-10 DIAGNOSIS — N183 Chronic kidney disease, stage 3 unspecified: Secondary | ICD-10-CM

## 2017-05-10 DIAGNOSIS — N179 Acute kidney failure, unspecified: Secondary | ICD-10-CM

## 2017-05-24 ENCOUNTER — Ambulatory Visit (INDEPENDENT_AMBULATORY_CARE_PROVIDER_SITE_OTHER): Payer: Medicare Other | Admitting: *Deleted

## 2017-05-24 ENCOUNTER — Ambulatory Visit: Payer: Medicare Other | Admitting: Internal Medicine

## 2017-05-24 DIAGNOSIS — I495 Sick sinus syndrome: Secondary | ICD-10-CM | POA: Diagnosis not present

## 2017-05-24 NOTE — Progress Notes (Signed)
Remote pacemaker transmission.   

## 2017-05-26 ENCOUNTER — Encounter: Payer: Self-pay | Admitting: Cardiology

## 2017-06-09 ENCOUNTER — Ambulatory Visit (INDEPENDENT_AMBULATORY_CARE_PROVIDER_SITE_OTHER): Payer: Medicare Other | Admitting: Pharmacist

## 2017-06-09 VITALS — BP 154/60 | HR 60

## 2017-06-09 DIAGNOSIS — I10 Essential (primary) hypertension: Secondary | ICD-10-CM | POA: Diagnosis not present

## 2017-06-09 NOTE — Progress Notes (Signed)
Patient ID: [name]                 DOB: 08/26/1941                      MRN: [id number]     HPI: [name] is a 76 y.o. female referred by Dr. Debara Walker ot HTN clinic. PMH includes CABG x 3 in 5176, stage 1 diastolic dysfunction, normal EF, bradycardia s/p pacemaker placement, and atrial flutter. Also suffers from vertigo caused by inner ear problem.   Presents today to clinicaccompany bu her daughter. Still experiencing frequent dizziness and unsteady gait. Reports feeling better but more tired in the morning. Now f/u with nephrologist for renal function with decrease on diuretics and amlodipine doses per nephrologist recommenation.  Current HTN meds:  Amlodipine 5mg  Walker Carvedilol 6.25mg  twice Walker Doxazosin 4mg  every evening Furosemide 20mg  Walker  BP goal: 140/90  (will avoid target BP of <130/80 due to increase dizziness with lower BP and chronic inner ear problems)  Family History:              Mother - hypertension, stroke at 22, died about 1 year later Father heart disease (died from MI around age55) 80 siblings - 49/5 brothers died from MI, at least 2 of those wereunder 67 years old 48 sisters - 2 with CABG, in addition to patient 2 kids with no heart disease to date  Social History: No tobacco; no alcohol   Diet: decaf coffee - 1 cup Walker, no soda except rarely;No added salt to foods; eating out 1-2 times per week, admits to likingfried fish (with recent recurrent episodes of diarrhea patient appetite is significantly decreased)  Exercise: Activity of Walker living; occasional yard work  Home BP readings: 27 readings; average 134/59 (pulse 60-71bpm) OMRON home BP arm cuff calibrated - accurate within 61mmHg   Wt Readings from Last 3 Encounters:  04/22/17 137 lb 9.6 oz (62.4 kg)  04/08/17 141 lb 6.4 oz (64.1 kg)  01/10/17 136 lb (61.7 kg)   BP Readings from Last 3 Encounters:  06/09/17 (!) 154/60    04/22/17 132/66  04/08/17 120/68   Pulse Readings from Last 3 Encounters:  06/09/17 60  04/22/17 76  04/08/17 60    Past Medical History:  Diagnosis Date  . Arthritis    "in my hands"  . Carotid artery disease ([place])    a. 40-59% bilaterally 10/2015.  Marland Kitchen Chronic diastolic CHF (congestive heart failure) ([place])    a. 05/2016 Echo: EF 60-65%, no rwma, Gr1 DD, Ao sclerosis w/o stenosis, triv MR;  b. 07/2016 TEE: EF 55-60%, no rwma, mild MR.  . CKD (chronic kidney disease), stage III   . Coronary artery disease    a. 02/2007 Persantine MV: low risk;  b. 11/2011 CABG x 3 (LIMA->LAD, VG->OM, VG->RCA);  c. 05/2016 MV: EF >65%, no isch/infarct, horiz ST dep in I, II, V5-V6.  Marland Kitchen Depression   . Diabetes mellitus   . Diverticulosis   . Esophageal stricture   . GERD (gastroesophageal reflux disease)   . Hemorrhoids   . Hiatal hernia   . Hyperkalemia    a. ARB stopped due to this.  . Hyperlipidemia   . Hypertensive heart disease   . Hypothyroidism   . Mild cognitive impairment    a. seen by neurology.  Marland Kitchen PAF (paroxysmal atrial fibrillation) (HCC)    a. post-op CABG.  . Pain    RIGHT KNEE PAIN -  TORN RIGHT MEDIAL MENISCUS  . Paroxysmal atrial flutter ([place])    a. 07/2016 s/p TEE & DCCV;  b. 07/2016 Recurrent PAFlutter req initiation of amio & PPM in setting of tachy-brady;  c. CHA2DS2VASc = 7-->Xarelto 15 mg QD.  Marland Kitchen PONV (postoperative nausea and vomiting)   . S/P CABG (coronary artery bypass graft), 12/04/11 12/07/2011   LIMA to LAD, SVG to OM, SVG to RCA  . Sinus bradycardia    a. not on BB due to this.  . Tachy-brady syndrome ([place])    a. 07/2016 Jxnl brady following DCCV, recurrent Aflutter-->amio + SJM 2272 Assurity MRI DC PPM (ser # [id number]).    Current Outpatient Prescriptions on File Prior to Visit  Medication Sig Dispense Refill  . amiodarone (PACERONE) 200 MG tablet Take 0.5 tablets (100 mg total) by mouth Walker. 30 tablet 6  . amLODipine (NORVASC) 10 MG tablet Take 5 mg by mouth  Walker.    Marland Kitchen aspirin 81 MG chewable tablet Chew 1 tablet (81 mg total) by mouth Walker. 30 tablet 1  . carvedilol (COREG) 6.25 MG tablet Take 1 tablet (6.25 mg total) by mouth 2 (two) times Walker. 60 tablet 5  . Cyanocobalamin (VITAMIN B-12 PO) Take 1 tablet by mouth Walker.    Marland Kitchen doxazosin (CARDURA) 4 MG tablet TAKE ONE TABLET BY MOUTH ONCE Walker 30 tablet 6  . esomeprazole (NEXIUM) 20 MG capsule TAKE ONE CAPSULE BY MOUTH ONCE Walker AT  12  NOON 90 capsule 1  . furosemide (LASIX) 20 MG tablet Take 20 mg by mouth Walker.    Marland Kitchen gabapentin (NEURONTIN) 300 MG capsule Take 300-600 mg by mouth See admin instructions. Take 300 mg by mouth in the morning and take 600 mg by mouth at bedtime    . insulin glargine (LANTUS) 100 UNIT/ML injection Inject 14 Units into the skin every morning.     Marland Kitchen levothyroxine (SYNTHROID) 88 MCG tablet Take 1 tablet by mouth Walker.    . meclizine (ANTIVERT) 25 MG tablet Take 25 mg by mouth Walker as needed for dizziness. For dizziness    . Multiple Vitamins-Minerals (PRESERVISION AREDS 2 PO) Take 1 tablet by mouth Walker.    [name] Glycol-Propyl Glycol (SYSTANE OP) Place 1 drop into both eyes 2 (two) times Walker.     . Rivaroxaban (XARELTO) 15 MG TABS tablet Take 1 tablet (15 mg total) by mouth Walker with supper. 30 tablet 12   No current facility-administered medications on file prior to visit.     Allergies  Allergen Reactions  . Clonidine Derivatives Other (See Comments)    Bradycardia and fatigue   . Crestor [Rosuvastatin Calcium] Other (See Comments)    Other reaction(s): tired and weak  . Epinephrine Other (See Comments)    Abnormal feeling. Dental exam/injection of local w/ epi.  Marland Kitchen Hydralazine Other (See Comments)    Nausea/gi upset   . Losartan Other (See Comments)    Hyperkalemia   . Other Other (See Comments)    MANGO'S - WHELPS ALL OVER  . Sulfa Antibiotics Other (See Comments)    Unknown    Blood pressure (!) 154/60, pulse 60.  HTN  (hypertension) Blood pressure is above goal during office visit but patient reports taking her morning medication shortly before appointment (later than usual for her). She continues to report increase dizziness in the morning when her BP is 120s and 130s. Noted home BP average of 134/59 remains at goal with consistently low diastolic BP.  Will decrease  doxazosin from 4mg  to 2mg  at bedtime to improve tolerability and decrease morning dizziness. No other medication changes at this time. Patient to continue Walker BP monitoring to bring records to next f/u in 6 weeks. Plan to increase amlodipine dose if additional BP control needed.    Raquel Rodriguez-Guzman PharmD, [place] [id number] 06/10/2017 11:56 AM

## 2017-06-09 NOTE — Patient Instructions (Addendum)
Return for a a follow up appointment in 6-8 weeks  Your blood pressure today is 158/60 pulse 64  Check your blood pressure at home daily (if able) and keep record of the readings.  Take your BP meds as follows: *DECRASE doxazosin to 2mg  every evening* *CONTINUE all other medication as prescribed*   Bring your BP cuff and your record of home blood pressures to your next appointment.  Exercise as you're able, try to walk approximately 30 minutes per day.  Keep salt intake to a minimum, especially watch canned and prepared boxed foods.  Eat more fresh fruits and vegetables and fewer canned items.  Avoid eating in fast food restaurants.    HOW TO TAKE YOUR BLOOD PRESSURE: . Rest 5 minutes before taking your blood pressure. .  Don't smoke or drink caffeinated beverages for at least 30 minutes before. . Take your blood pressure before (not after) you eat. . Sit comfortably with your back supported and both feet on the floor (don't cross your legs). . Elevate your arm to heart level on a table or a desk. . Use the proper sized cuff. It should fit smoothly and snugly around your bare upper arm. There should be enough room to slip a fingertip under the cuff. The bottom edge of the cuff should be 1 inch above the crease of the elbow. . Ideally, take 3 measurements at one sitting and record the average.

## 2017-06-10 NOTE — Assessment & Plan Note (Signed)
Blood pressure is above goal during office visit but patient reports taking her morning medication shortly before appointment (later than usual for her). She continues to report increase dizziness in the morning when her BP is 120s and 130s. Noted home BP average of 134/59 remains at goal with consistently low diastolic BP.  Will decrease doxazosin from 4mg  to 2mg  at bedtime to improve tolerability and decrease morning dizziness. No other medication changes at this time. Patient to continue daily BP monitoring to bring records to next f/u in 6 weeks. Plan to increase amlodipine dose if additional BP control needed.

## 2017-06-28 LAB — CUP PACEART REMOTE DEVICE CHECK
Battery Remaining Longevity: 111 mo
Battery Remaining Percentage: 95.5 %
Battery Voltage: 2.99 V
Brady Statistic AP VP Percent: 66 %
Brady Statistic AP VS Percent: 34 %
Brady Statistic AS VP Percent: 1 %
Brady Statistic AS VS Percent: 1 %
Brady Statistic RA Percent Paced: 99 %
Brady Statistic RV Percent Paced: 66 %
Date Time Interrogation Session: 20180731060012
Implantable Lead Implant Date: 20171019
Implantable Lead Implant Date: 20171019
Implantable Lead Location: 753859
Implantable Lead Location: 753860
Implantable Pulse Generator Implant Date: 20171019
Lead Channel Impedance Value: 450 Ohm
Lead Channel Impedance Value: 780 Ohm
Lead Channel Pacing Threshold Amplitude: 0.5 V
Lead Channel Pacing Threshold Amplitude: 1 V
Lead Channel Pacing Threshold Pulse Width: 0.4 ms
Lead Channel Pacing Threshold Pulse Width: 0.4 ms
Lead Channel Sensing Intrinsic Amplitude: 1.4 mV
Lead Channel Sensing Intrinsic Amplitude: 12 mV
Lead Channel Setting Pacing Amplitude: 2 V
Lead Channel Setting Pacing Amplitude: 2.5 V
Lead Channel Setting Pacing Pulse Width: 0.4 ms
Lead Channel Setting Sensing Sensitivity: 2 mV
Pulse Gen Model: 2272
Pulse Gen Serial Number: 3180458

## 2017-08-02 ENCOUNTER — Other Ambulatory Visit: Payer: Self-pay | Admitting: Internal Medicine

## 2017-08-08 ENCOUNTER — Encounter: Payer: Self-pay | Admitting: Internal Medicine

## 2017-08-19 ENCOUNTER — Telehealth: Payer: Self-pay | Admitting: Internal Medicine

## 2017-08-19 NOTE — Telephone Encounter (Signed)
Returned call to daughter [name] + patient.   Patient c/o shortness of breath + chest heaviness since last week. Her chest feels like it is "pressing in on her". She reports she is gaining about 1lb daily and is not eating much. She is up to 144lbs. She c/o SOB with minimal exertion. She reports wheezing when bending over. Complains of swelling in abdominal area.   Last labs scanned in to [facility] from 03/2017 - BUN 35 - creatinine 2.7  Patient sees nephrologist today around [place] patient/daughter to call after this appt with their recommendations on diuretic adjustment based on symptoms  Will forward to DOD to review & advise Scheduled to see MD 08/23/17

## 2017-08-19 NOTE — Telephone Encounter (Signed)
Follow up    Patient daughter calling back with BP reading. Nephrologist visit today, doctor increased Lasix from 20 to 40 MG. Also had labs drawn.   Pt c/o BP issue: STAT if pt c/o blurred vision, one-sided weakness or slurred speech 1. What are your last 5 BP readings? 140/62  2. Are you having any other symptoms (ex. Dizziness, headache, blurred vision, passed out)? NO  3. What is your BP issue? n/a

## 2017-08-19 NOTE — Telephone Encounter (Signed)
[name] (daughter) is calling because he mom is feeling some shortness of breath and heaviness on her chest . No Chest Pains . Please call

## 2017-08-21 NOTE — Telephone Encounter (Signed)
BP looks OK.  If she is having symptoms and weight gain she should be seen in our clinic this week.  Schedule with Dr. Debara Pickett or APP.

## 2017-08-22 NOTE — Telephone Encounter (Signed)
Left detailed message(DPR) for daughter to update message and to see if f/u is needed.

## 2017-08-23 ENCOUNTER — Ambulatory Visit: Payer: Medicare Other | Admitting: Internal Medicine

## 2017-08-23 ENCOUNTER — Telehealth: Payer: Self-pay | Admitting: Internal Medicine

## 2017-08-23 ENCOUNTER — Ambulatory Visit (INDEPENDENT_AMBULATORY_CARE_PROVIDER_SITE_OTHER): Payer: Medicare Other | Admitting: *Deleted

## 2017-08-23 ENCOUNTER — Other Ambulatory Visit: Payer: Self-pay | Admitting: Internal Medicine

## 2017-08-23 DIAGNOSIS — I495 Sick sinus syndrome: Secondary | ICD-10-CM | POA: Diagnosis not present

## 2017-08-23 NOTE — Telephone Encounter (Signed)
Received records from [facility] for appointment on 08/25/17 with Dr Debara Pickett.  Records put with Dr Lysbeth Penner schedule for 08/25/17 lp

## 2017-08-23 NOTE — Progress Notes (Signed)
Remote pacemaker transmission.   

## 2017-08-23 NOTE — Telephone Encounter (Signed)
Patient has MD appointment 08/25/17

## 2017-08-25 ENCOUNTER — Ambulatory Visit (INDEPENDENT_AMBULATORY_CARE_PROVIDER_SITE_OTHER): Payer: Medicare Other | Admitting: Internal Medicine

## 2017-08-25 ENCOUNTER — Encounter: Payer: Self-pay | Admitting: Internal Medicine

## 2017-08-25 VITALS — BP 140/64 | HR 60 | Ht 60.0 in | Wt 146.2 lb

## 2017-08-25 DIAGNOSIS — Z79899 Other long term (current) drug therapy: Secondary | ICD-10-CM

## 2017-08-25 DIAGNOSIS — R0602 Shortness of breath: Secondary | ICD-10-CM | POA: Diagnosis not present

## 2017-08-25 DIAGNOSIS — Z7901 Long term (current) use of anticoagulants: Secondary | ICD-10-CM

## 2017-08-25 DIAGNOSIS — I5033 Acute on chronic diastolic (congestive) heart failure: Secondary | ICD-10-CM | POA: Diagnosis not present

## 2017-08-25 DIAGNOSIS — I480 Paroxysmal atrial fibrillation: Secondary | ICD-10-CM

## 2017-08-25 DIAGNOSIS — I10 Essential (primary) hypertension: Secondary | ICD-10-CM

## 2017-08-25 NOTE — Progress Notes (Signed)
OFFICE NOTE  Chief Complaint:  Routine follow-up  Primary Care Physician: Leeroy Cha, MD  HPI:  [name] is a pleasant 76 year old female with history of coronary disease and CABG x 3 vessels in 2013, LIMA to LAD, SVG to OM and SVG to right coronary. She underwent an echocardiogram which demonstrates normal EF of 55% to 65%. However, there is stage 1 diastolic dysfunction and evidence for elevated mean left atrial filling pressure. There were mildly thickened calcified aortic valve leaflets but no stenosis and mild tricuspid regurgitation. At the time, I started her on Lasix 20 mg daily. A BMP was obtained that day which indicated mild elevation at 75.5, creatinine was 1.16. She has been taking the Lasix with improvement in her lower extremity swelling although shortness of breath seems to be about the same. Again, she notes it is only with marked exertion, not necessarily with normal activities. However, she does have ongoing fatigue and easy fatigability. This may be due to just poor exercise tolerance. She also reported that recently you started her on a new SLGT2 diabetic medicine and she said that she took several doses of that, which I believe was Invokana, and she became markedly dizzy with that. She has since stopped that medication. Again, overall the patient is fairly stable with a normal ejection fraction. She recently had bypass and is not having any anginal symptoms and therefore I do not suspect that this is an issue related to her bypass grafts.   I saw [name] today in the office for an acute visit regarding what is described as abdominal pain. She reports a numbness and pain sensation over the right upper quadrant. She's also noted some firmness and distention of her abdomen. This is recently presented to a GI doctor in [place] who thought she might have a gastric ulcer. He performed endoscopy which was a reportedly unrevealing. He also recommended she is take  MiraLAX for constipation. She says that recently she's been constipated but also has been having some small amount of loose stool. She did not want to take the MiraLAX given the loose stool. She also reports some of the discomfort extends across the mid epigastrium and into the lower chest. It is entirely atypical for cardiac chest pain. She reports that she had a colonoscopy in the past by Dr. Henrene Pastor about 5 years ago and a repeat colonoscopy was recommended around 2018. She also tells me that she recently had a right upper quadrant ultrasound performed at [facility] after presenting with abdominal pain which was negative for acute biliary disease.  [name] returns today for follow-up. Overall she seems to be doing well from a cardiac standpoint. I understand she is switched primary care providers to [facility] primary care, however she is unclear of her primary care provider. She does have an upcoming appointment to see [facility] neurology. Recently she's been having trouble with forgetfulness and it seems to be getting worse. She tells me she lost her cell phone at home and has not been able to locate it. She denies any chest pain or shortness of breath and in fact her flank back and abdominal pain which was evaluated extensively has now gone away.  I had the pleasure of seeing [name] back today in follow-up. It's been 6 months and she'll last appointment. Her main concern today is leg weakness. She reports she has some back pain and associated weakness in her legs that give out from time  to time. She recently had memory testing indicating possible dementia as well as likely depression. It was recommended that she go on therapy however she declined medications. She denies any chest pain or worsening shortness of breath.  07/01/2016  [name] returns today for follow-up. She was recently hospitalized for acute diastolic heart failure. She was treated with diuretics and  adjustments are made to her blood pressure medications. She had a repeat echocardiogram which was essentially stable showed normal LV EF and diastolic dysfunction. After discharge she underwent nuclear stress testing which was negative for ischemia. She saw Ignacia Bayley, NP, who made some changes to her blood pressure medications including placing her on clonidine. Since taking this medicine she has had some of the typical symptoms of fatigue and dry mouth as well as constipation. She was alert initially taking clonidine 2 mg twice daily. She then decreased it to one half tablet or 1 mg twice daily but blood pressure remains high. I checked her blood pressure manually today 160/60. She reports fatigue and low energy.  09/22/2016  [name] returns today for hospital follow-up. 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 She had a St. Jude surety MRI safe pacemaker placed. She was also started on amiodarone and underwent cardioversion. She seems to be maintaining sinus rhythm. She reports improvement in her energy. She recently saw Ignacia Bayley, NP, in follow-up who reported she was doing well although noted her blood pressure was more elevated. She was placed on doxazosin 4 mg daily and had initial improvement in blood pressure however blood pressure has since gone up. Today was 186/62. Unfortunately, she's been intolerant of a number of blood pressure medications in the past.  12/23/2016  [name] was seen in follow-up today. She saw Dr. candidates in January who noted that she reported being shaky on amiodarone and he decreased the dose down to 100 mg daily. She remains in an AV paced rhythm but there has been recurrent underlying atrial flutter. She is also had some fatigue. The pressure is elevated today 156/70. I reviewed home monitoring indicates blood pressures continue to be elevated. She seems to be okay with these numbers but she  is at increased risk for events. Unfortunate she's had intolerance to number of medications.  04/22/2017  [name] returns today for follow-up. She recently saw Almyra Deforest, PA-C, who noted that she had had some weight gain and lower extremity edema. He discontinued her chlorthalidone and put her on low-dose Lasix. She's had improvement in her swallowing with this and some weight loss. She then followed up with Dr. Moshe Cipro at [facility]. Her creatinine was noted to be 2.7 and she is undergoing a renal ultrasound and further workup for this. She was also noted to be anemic with a low iron saturation. She's been receiving some IV iron. She seems to be maintaining sinus rhythm on low-dose amiodarone. Recently she saw her endocrinologist who had increased her Synthroid. Her last TSH was between 7 and 8. She is due for repeat check of that. She has done well with the placement of a pacemaker in the fall. She has remote checks to follow that.  08/25/2017  [name] was seen today in follow-up.  Recently she had about 12 pound weight gain and saw her nephrologist.  She was started back on Lasix and was noted to have an elevated creatinine now back up to 2.14.  In addition her blood pressure was running low, specifically  her diastolic blood pressure and her amlodipine was decreased to 5 mg daily.  She seems to be maintaining sinus rhythm and is on low-dose amiodarone.  She is in a paced rhythm today and recent remote check showed no real evidence of atrial fibrillation.  She was on Xarelto however stopped that on her own recently because of fatigue and weakness.  She was also noted to be anemic and has been started on iron.  This begs a question of whether she may be having microscopic bleeding or whether her anemia was related to chronic kidney disease.  Nevertheless, she has an appropriately elevated chads vascular score and should be anticoagulated on Xarelto.  PMHx:  Past Medical History:    Diagnosis Date  . Arthritis    "in my hands"  . Carotid artery disease ([place])    a. 40-59% bilaterally 10/2015.  Marland Kitchen Chronic diastolic CHF (congestive heart failure) ([place])    a. 05/2016 Echo: EF 60-65%, no rwma, Gr1 DD, Ao sclerosis w/o stenosis, triv MR;  b. 07/2016 TEE: EF 55-60%, no rwma, mild MR.  . CKD (chronic kidney disease), stage III ([place])   . Coronary artery disease    a. 02/2007 Persantine MV: low risk;  b. 11/2011 CABG x 3 (LIMA->LAD, VG->OM, VG->RCA);  c. 05/2016 MV: EF >65%, no isch/infarct, horiz ST dep in I, II, V5-V6.  Marland Kitchen Depression   . Diabetes mellitus   . Diverticulosis   . Esophageal stricture   . GERD (gastroesophageal reflux disease)   . Hemorrhoids   . Hiatal hernia   . Hyperkalemia    a. ARB stopped due to this.  . Hyperlipidemia   . Hypertensive heart disease   . Hypothyroidism   . Mild cognitive impairment    a. seen by neurology.  Marland Kitchen PAF (paroxysmal atrial fibrillation) ([facility])    a. post-op CABG.  . Pain    RIGHT KNEE PAIN - TORN RIGHT MEDIAL MENISCUS  . Paroxysmal atrial flutter ([place])    a. 07/2016 s/p TEE & DCCV;  b. 07/2016 Recurrent PAFlutter req initiation of amio & PPM in setting of tachy-brady;  c. CHA2DS2VASc = 7-->Xarelto 15 mg QD.  Marland Kitchen PONV (postoperative nausea and vomiting)   . S/P CABG (coronary artery bypass graft), 12/04/11 12/07/2011   LIMA to LAD, SVG to OM, SVG to RCA  . Sinus bradycardia    a. not on BB due to this.  . Tachy-brady syndrome ([place])    a. 07/2016 Jxnl brady following DCCV, recurrent Aflutter-->amio + SJM 2272 Assurity MRI DC PPM (ser # [id number]).    Past Surgical History:  Procedure Laterality Date  . ABDOMINAL HYSTERECTOMY  1980's  . BACK SURGERY  2006   "cyst growing near my spine"  . CARDIAC CATHETERIZATION  12/02/2011   mild LV dysfunction with mod hypocontractility of mid-distal anterolateral wall; CAD w/ostial tapering of L [place] with 50% diffuse ostial narrowing of LAD, 99% eccentric focal prox LAD stenosis followed by 70%  prox LAD stenosis after 1st diag, 20% mid LAD narrowing; 80% ostial-to-prox L Cfx stenosis & 40-50% irregularity of RCA (Dr. Corky Downs)  . CARDIOVERSION N/A 08/11/2016   Procedure: CARDIOVERSION;  Surgeon: Lelon Perla, MD;  Location: [place];  Service: Cardiovascular;  Laterality: N/A;  . CATARACT EXTRACTION W/ INTRAOCULAR LENS  IMPLANT, BILATERAL  ~ 2010  . [place]  . CORONARY ARTERY BYPASS GRAFT  12/04/2011   Procedure: CORONARY ARTERY BYPASS GRAFTING (CABG);  Surgeon: Tharon Aquas Adelene Idler, MD;  Location: [facility];  Service: Open Heart Surgery;  Laterality: N/A;  CABG x three,  using left internal mammary artery, and right leg greater saphenous vein harvested endoscopically  . DILATION AND CURETTAGE OF UTERUS     "a couple times"  . EP IMPLANTABLE DEVICE N/A 08/12/2016   Procedure: Pacemaker Implant;  Surgeon: Will Meredith Leeds, MD;  Location: [facility];  Service: Cardiovascular;  Laterality: N/A;  . FRACTURE SURGERY     "put pins both side right ankle"  . JOINT REPLACEMENT    . KNEE ARTHROSCOPY WITH MEDIAL MENISECTOMY Right 07/02/2014   Procedure: RIGHT KNEE ARTHROSCOPY WITH PARTIAL MEDIAL MENISTECTOMY, ABRASION CONDROPLASTYU OF PATELLA,ABRASION CONDROPLASTY OF MEDIAL FEMEROL CONDYL, MICROFRACTURE OF MEDIAL FEMEROL CONDYL;  Surgeon: Tobi Bastos, MD;  Location: [facility];  Service: Orthopedics;  Laterality: Right;  . LEFT HEART CATHETERIZATION WITH CORONARY ANGIOGRAM N/A 12/02/2011   Procedure: LEFT HEART CATHETERIZATION WITH CORONARY ANGIOGRAM;  Surgeon: Troy Sine, MD;  Location: [facility] CATH LAB;  Service: Cardiovascular;  Laterality: N/A;  Coronary angiogram, possible PCI  . TEE WITHOUT CARDIOVERSION N/A 08/11/2016   Procedure: TRANSESOPHAGEAL ECHOCARDIOGRAM (TEE);  Surgeon: Lelon Perla, MD;  Location: [place];  Service: Cardiovascular;  Laterality: N/A;  . TONSILLECTOMY  1949  . TOTAL KNEE ARTHROPLASTY  ~ 2006   left  . TRANSTHORACIC ECHOCARDIOGRAM   02/19/2013   EF 03-47%, grade 1 diastolic dysfunction; mildly thickend/calcified AV leaflets; mildly calcidied MV annulus; mild TR    FAMHx:  Family History  Problem Relation Age of Onset  . Diabetes Mother   . CVA Mother   . Hypertension Mother   . Heart disease Father   . Hyperlipidemia Father   . Breast cancer Sister        x 3  . Heart disease Brother        x5; one with MI  . Heart disease Sister        x3  . Diabetes Sister        x3  . Lung cancer Sister   . Breast cancer Sister        x2  . Colon cancer Neg Hx     SOCHx:   reports that she has never smoked. She has never used smokeless tobacco. She reports that she drinks alcohol. She reports that she does not use drugs.  ALLERGIES:  Allergies  Allergen Reactions  . Clonidine Derivatives Other (See Comments)    Bradycardia and fatigue   . Crestor [Rosuvastatin Calcium] Other (See Comments)    Other reaction(s): tired and weak  . Epinephrine Other (See Comments)    Abnormal feeling. Dental exam/injection of local w/ epi.  Marland Kitchen Hydralazine Other (See Comments)    Nausea/gi upset   . Losartan Other (See Comments)    Hyperkalemia   . Other Other (See Comments)    MANGO'S - WHELPS ALL OVER  . Sulfa Antibiotics Other (See Comments)    Unknown    ROS: Pertinent items noted in HPI and remainder of comprehensive ROS otherwise negative.  HOME MEDS: Current Outpatient Prescriptions  Medication Sig Dispense Refill  . amiodarone (PACERONE) 200 MG tablet Take 0.5 tablets (100 mg total) by mouth daily. 30 tablet 6  . amLODipine (NORVASC) 10 MG tablet Take 5 mg by mouth daily.    Marland Kitchen aspirin 81 MG chewable tablet Chew 1 tablet (81 mg total) by mouth daily. 30 tablet 1  . carvedilol (COREG) 6.25 MG tablet TAKE 1 TABLET BY MOUTH TWICE  DAILY 60 tablet 5  . Cyanocobalamin (VITAMIN B-12 PO) Take 1 tablet by mouth daily.    Marland Kitchen doxazosin (CARDURA) 4 MG tablet TAKE ONE TABLET BY MOUTH ONCE DAILY (Patient taking differently:  TAKE ONE HALF TABLET BY MOUTH ONCE DAILY) 30 tablet 6  . esomeprazole (NEXIUM) 20 MG capsule TAKE ONE CAPSULE BY MOUTH ONCE DAILY AT  12  NOON 90 capsule 1  . ferrous sulfate 325 (65 FE) MG EC tablet Take 325 mg by mouth daily with breakfast.    . furosemide (LASIX) 20 MG tablet Take 40 mg by mouth daily.     Marland Kitchen gabapentin (NEURONTIN) 300 MG capsule Take 300-600 mg by mouth See admin instructions. Take 300 mg by mouth in the morning and take 600 mg by mouth at bedtime    . insulin glargine (LANTUS) 100 UNIT/ML injection Inject 14 Units into the skin every morning.     Marland Kitchen levothyroxine (SYNTHROID) 88 MCG tablet Take 1 tablet by mouth daily.    . meclizine (ANTIVERT) 25 MG tablet Take 25 mg by mouth daily as needed for dizziness. For dizziness    . Multiple Vitamins-Minerals (PRESERVISION AREDS 2 PO) Take 1 tablet by mouth daily.    Vladimir Faster Glycol-Propyl Glycol (SYSTANE OP) Place 1 drop into both eyes 2 (two) times daily.     . Rivaroxaban (XARELTO) 15 MG TABS tablet Take 1 tablet (15 mg total) by mouth daily with supper. (Patient not taking: Reported on 08/25/2017) 30 tablet 12   No current facility-administered medications for this visit.     LABS/IMAGING: No results found for this or any previous visit (from the past 48 hour(s)). No results found.  VITALS: BP 140/64   Pulse 60   Ht 5' (1.524 m)   Wt 146 lb 3.2 oz (66.3 kg)   SpO2 98%   BMI 28.55 kg/m   EXAM: General appearance: alert and no distress Neck: no carotid bruit and no JVD Lungs: clear to auscultation bilaterally Heart: regular rate and rhythm, S1, S2 normal, no murmur, click, rub or gallop Abdomen: soft, non-tender; bowel sounds normal; no masses,  no organomegaly Extremities: edema Trace to 1+ bilateral lower extremity edema Pulses: 2+ and symmetric Skin: Skin color, texture, turgor normal. No rashes or lesions Neurologic: Grossly normal  EKG: Atrial paced rhythm with prolonged AV conduction at 60-personally  reviewed  ASSESSMENT: 1. Coronary artery disease status post CABG x 3 vessels in 2013 2. Paroxysmal atrial flutter-CHADSVASC score of 5 on Xarelto 3. Tachybradycardia syndrome status post St. Jude permanent pacemaker (07/2016) 4. Diabetes type 2 - now on insulin 5. Hypertension 6. Dyslipidemia 7. Peripheral neuropathy - secondary to diabetes 8. Progressive memory loss 9. Low back pain and leg weakness  PLAN: 1.   Dania has had a recent weight gain and particularly increased abdominal girth with swelling.  She says that she has been taking Lasix since this past Sunday and is lost about 3 pounds although her weight in the office today was exactly the same as it was in her nephrologist office.  I encouraged her to continue to monitor that.  She stopped taking Xarelto despite her high CHADSVASC score of 5.  I recommended that she resume her Xarelto 15 mg daily.  She should take no more than aspirin 81 mg daily.  This will give [place] an opportunity to look for the development of any recurrent anemia.  If she becomes anemic despite taking iron then she would likely need stool cards and GI  workup.  We will plan to recheck a metabolic profile, CBC and BNP in 1 month and see her back at that time.  Follow-up 1 months.  [name], MD, [facility] Attending Cardiologist [place] [facility] [place] 08/25/2017, 1:07 PM

## 2017-08-25 NOTE — Patient Instructions (Signed)
RESUME xarelto 15mg  daily  Your physician recommends that you return for lab work - CBC, BMET, BNP - prior to your appointment in [employer]

## 2017-08-31 ENCOUNTER — Encounter: Payer: Self-pay | Admitting: Cardiology

## 2017-09-01 LAB — CUP PACEART REMOTE DEVICE CHECK
Battery Remaining Longevity: 110 mo
Battery Remaining Percentage: 95.5 %
Battery Voltage: 3.01 V
Brady Statistic AP VP Percent: 63 %
Brady Statistic AP VS Percent: 37 %
Brady Statistic AS VP Percent: 1 %
Brady Statistic AS VS Percent: 1 %
Brady Statistic RA Percent Paced: 99 %
Brady Statistic RV Percent Paced: 63 %
Date Time Interrogation Session: 20181030060014
Implantable Lead Implant Date: 20171019
Implantable Lead Implant Date: 20171019
Implantable Lead Location: 753859
Implantable Lead Location: 753860
Implantable Pulse Generator Implant Date: 20171019
Lead Channel Impedance Value: 430 Ohm
Lead Channel Impedance Value: 900 Ohm
Lead Channel Pacing Threshold Amplitude: 0.5 V
Lead Channel Pacing Threshold Amplitude: 1 V
Lead Channel Pacing Threshold Pulse Width: 0.4 ms
Lead Channel Pacing Threshold Pulse Width: 0.4 ms
Lead Channel Sensing Intrinsic Amplitude: 1.3 mV
Lead Channel Sensing Intrinsic Amplitude: 12 mV
Lead Channel Setting Pacing Amplitude: 2 V
Lead Channel Setting Pacing Amplitude: 2.5 V
Lead Channel Setting Pacing Pulse Width: 0.4 ms
Lead Channel Setting Sensing Sensitivity: 2 mV
Pulse Gen Model: 2272
Pulse Gen Serial Number: 3180458

## 2017-09-19 ENCOUNTER — Other Ambulatory Visit: Payer: Self-pay | Admitting: Pharmacist Clinician (PhC)/ Clinical Pharmacy Specialist

## 2017-09-19 MED ORDER — RIVAROXABAN 15 MG PO TABS: 15.0000 mg | ORAL_TABLET | Freq: Every day | ORAL | 0 refills | Status: DC

## 2017-09-29 ENCOUNTER — Telehealth: Payer: Self-pay | Admitting: Internal Medicine

## 2017-09-29 NOTE — Telephone Encounter (Signed)
New message   Pt daughter [name] is calling for rn to ask her questions

## 2017-09-29 NOTE — Telephone Encounter (Signed)
Patient's daughter called in requesting samples of Xarelto 15 mg.  Medication Samples have been provided to the patient.  Drug name: Xarelto        Strength: 15 mg         Qty: 3 bottles   LOT: [id number]   Exp.Date: 12/20

## 2017-10-10 LAB — CBC
Hematocrit: 30.5 % — ABNORMAL LOW (ref 34.0–46.6)
Hemoglobin: 10.5 g/dL — ABNORMAL LOW (ref 11.1–15.9)
MCH: 32.2 pg (ref 26.6–33.0)
MCHC: 34.4 g/dL (ref 31.5–35.7)
MCV: 94 fL (ref 79–97)
Platelets: 223 10*3/uL (ref 150–379)
RBC: 3.26 x10E6/uL — ABNORMAL LOW (ref 3.77–5.28)
RDW: 12.9 % (ref 12.3–15.4)
WBC: 7.1 10*3/uL (ref 3.4–10.8)

## 2017-10-10 LAB — PRO B NATRIURETIC PEPTIDE: NT-Pro BNP: 1437 pg/mL — ABNORMAL HIGH (ref 0–738)

## 2017-10-10 LAB — BASIC METABOLIC PANEL
BUN/Creatinine Ratio: 20 (ref 12–28)
BUN: 44 mg/dL — ABNORMAL HIGH (ref 8–27)
CO2: 25 mmol/L (ref 20–29)
Calcium: 8.6 mg/dL — ABNORMAL LOW (ref 8.7–10.3)
Chloride: 102 mmol/L (ref 96–106)
Creatinine, Ser: 2.24 mg/dL — ABNORMAL HIGH (ref 0.57–1.00)
GFR calc Af Amer: 24 mL/min/{1.73_m2} — ABNORMAL LOW (ref >59)
GFR calc non Af Amer: 21 mL/min/{1.73_m2} — ABNORMAL LOW (ref >59)
Glucose: 138 mg/dL — ABNORMAL HIGH (ref 65–99)
Potassium: 5.4 mmol/L — ABNORMAL HIGH (ref 3.5–5.2)
Sodium: 142 mmol/L (ref 134–144)

## 2017-10-13 ENCOUNTER — Ambulatory Visit: Payer: Medicare Other | Admitting: Internal Medicine

## 2017-10-26 ENCOUNTER — Encounter: Payer: Self-pay | Admitting: Internal Medicine

## 2017-10-26 ENCOUNTER — Ambulatory Visit: Payer: Medicare Other | Admitting: Internal Medicine

## 2017-10-26 VITALS — BP 190/70 | HR 65 | Ht 60.0 in | Wt 149.0 lb

## 2017-10-26 DIAGNOSIS — I5033 Acute on chronic diastolic (congestive) heart failure: Secondary | ICD-10-CM | POA: Diagnosis not present

## 2017-10-26 DIAGNOSIS — I10 Essential (primary) hypertension: Secondary | ICD-10-CM | POA: Diagnosis not present

## 2017-10-26 DIAGNOSIS — Z79899 Other long term (current) drug therapy: Secondary | ICD-10-CM | POA: Diagnosis not present

## 2017-10-26 DIAGNOSIS — N183 Chronic kidney disease, stage 3 unspecified: Secondary | ICD-10-CM

## 2017-10-26 MED ORDER — FUROSEMIDE 20 MG PO TABS: 40.0000 mg | ORAL_TABLET | Freq: Two times a day (BID) | ORAL | 3 refills | Status: DC

## 2017-10-26 NOTE — Progress Notes (Signed)
OFFICE NOTE  Chief Complaint:  Short of breath, weight gain  Primary Care Physician: Leeroy Cha, MD  HPI:  [name] is a pleasant 77 year old female with history of coronary disease and CABG x 3 vessels in 2013, LIMA to LAD, SVG to OM and SVG to right coronary. She underwent an echocardiogram which demonstrates normal EF of 55% to 65%. However, there is stage 1 diastolic dysfunction and evidence for elevated mean left atrial filling pressure. There were mildly thickened calcified aortic valve leaflets but no stenosis and mild tricuspid regurgitation. At the time, I started her on Lasix 20 mg daily. A BMP was obtained that day which indicated mild elevation at 75.5, creatinine was 1.16. She has been taking the Lasix with improvement in her lower extremity swelling although shortness of breath seems to be about the same. Again, she notes it is only with marked exertion, not necessarily with normal activities. However, she does have ongoing fatigue and easy fatigability. This may be due to just poor exercise tolerance. She also reported that recently you started her on a new SLGT2 diabetic medicine and she said that she took several doses of that, which I believe was Invokana, and she became markedly dizzy with that. She has since stopped that medication. Again, overall the patient is fairly stable with a normal ejection fraction. She recently had bypass and is not having any anginal symptoms and therefore I do not suspect that this is an issue related to her bypass grafts.   I saw [name] today in the office for an acute visit regarding what is described as abdominal pain. She reports a numbness and pain sensation over the right upper quadrant. She's also noted some firmness and distention of her abdomen. This is recently presented to a GI doctor in [place] who thought she might have a gastric ulcer. He performed endoscopy which was a reportedly unrevealing. He also recommended  she is take MiraLAX for constipation. She says that recently she's been constipated but also has been having some small amount of loose stool. She did not want to take the MiraLAX given the loose stool. She also reports some of the discomfort extends across the mid epigastrium and into the lower chest. It is entirely atypical for cardiac chest pain. She reports that she had a colonoscopy in the past by Dr. Henrene Pastor about 5 years ago and a repeat colonoscopy was recommended around 2018. She also tells me that she recently had a right upper quadrant ultrasound performed at [facility] after presenting with abdominal pain which was negative for acute biliary disease.  [name] returns today for follow-up. Overall she seems to be doing well from a cardiac standpoint. I understand she is switched primary care providers to [facility] primary care, however she is unclear of her primary care provider. She does have an upcoming appointment to see Jackelyn Knife neurology. Recently she's been having trouble with forgetfulness and it seems to be getting worse. She tells me she lost her cell phone at home and has not been able to locate it. She denies any chest pain or shortness of breath and in fact her flank back and abdominal pain which was evaluated extensively has now gone away.  I had the pleasure of seeing [name] back today in follow-up. It's been 6 months and she'll last appointment. Her main concern today is leg weakness. She reports she has some back pain and associated weakness in her legs that give  out from time to time. She recently had memory testing indicating possible dementia as well as likely depression. It was recommended that she go on therapy however she declined medications. She denies any chest pain or worsening shortness of breath.  07/01/2016  [name] returns today for follow-up. She was recently hospitalized for acute diastolic heart failure. She was treated with diuretics  and adjustments are made to her blood pressure medications. She had a repeat echocardiogram which was essentially stable showed normal LV EF and diastolic dysfunction. After discharge she underwent nuclear stress testing which was negative for ischemia. She saw Ignacia Bayley, NP, who made some changes to her blood pressure medications including placing her on clonidine. Since taking this medicine she has had some of the typical symptoms of fatigue and dry mouth as well as constipation. She was alert initially taking clonidine 2 mg twice daily. She then decreased it to one half tablet or 1 mg twice daily but blood pressure remains high. I checked her blood pressure manually today 160/60. She reports fatigue and low energy.  09/22/2016  [name] returns today for hospital follow-up. 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 She had a St. Jude surety MRI safe pacemaker placed. She was also started on amiodarone and underwent cardioversion. She seems to be maintaining sinus rhythm. She reports improvement in her energy. She recently saw Ignacia Bayley, NP, in follow-up who reported she was doing well although noted her blood pressure was more elevated. She was placed on doxazosin 4 mg daily and had initial improvement in blood pressure however blood pressure has since gone up. Today was 186/62. Unfortunately, she's been intolerant of a number of blood pressure medications in the past.  12/23/2016  [name] was seen in follow-up today. She saw Dr. candidates in January who noted that she reported being shaky on amiodarone and he decreased the dose down to 100 mg daily. She remains in an AV paced rhythm but there has been recurrent underlying atrial flutter. She is also had some fatigue. The pressure is elevated today 156/70. I reviewed home monitoring indicates blood pressures continue to be elevated. She seems to be okay with these numbers but  she is at increased risk for events. Unfortunate she's had intolerance to number of medications.  04/22/2017  Mrs. Glance returns today for follow-up. She recently saw Almyra Deforest, PA-C, who noted that she had had some weight gain and lower extremity edema. He discontinued her chlorthalidone and put her on low-dose Lasix. She's had improvement in her swallowing with this and some weight loss. She then followed up with Dr. Moshe Cipro at [facility]. Her creatinine was noted to be 2.7 and she is undergoing a renal ultrasound and further workup for this. She was also noted to be anemic with a low iron saturation. She's been receiving some IV iron. She seems to be maintaining sinus rhythm on low-dose amiodarone. Recently she saw her endocrinologist who had increased her Synthroid. Her last TSH was between 7 and 8. She is due for repeat check of that. She has done well with the placement of a pacemaker in the fall. She has remote checks to follow that.  08/25/2017  Mrs. Banwart was seen today in follow-up.  Recently she had about 12 pound weight gain and saw her nephrologist.  She was started back on Lasix and was noted to have an elevated creatinine now back up to 2.14.  In addition her blood pressure was  running low, specifically her diastolic blood pressure and her amlodipine was decreased to 5 mg daily.  She seems to be maintaining sinus rhythm and is on low-dose amiodarone.  She is in a paced rhythm today and recent remote check showed no real evidence of atrial fibrillation.  She was on Xarelto however stopped that on her own recently because of fatigue and weakness.  She was also noted to be anemic and has been started on iron.  This begs a question of whether she may be having microscopic bleeding or whether her anemia was related to chronic kidney disease.  Nevertheless, she has an appropriately elevated chads vascular score and should be anticoagulated on Xarelto.  10/26/2017  Mrs. Birky returns  today for follow-up.  She again is gained about 5 or 6 pounds.  Apparently she had seen her nephrologist and was started back on Lasix however her creatinine is rising.  I repeated labs on October 10, 2017 which showed a creatinine had increased up to 2.24, previously 1.9 and 2.45.  Her pro-BNP was elevated at 1437.  Since that time she has had some worsening shortness of breath, lower extremity swelling and increasing abdominal girth.  Blood pressure is quite elevated today 190/70, however she says is been lower at home.  PMHx:  Past Medical History:  Diagnosis Date  . Arthritis    "in my hands"  . Carotid artery disease ([place])    a. 40-59% bilaterally 10/2015.  Marland Kitchen Chronic diastolic CHF (congestive heart failure) ([place])    a. 05/2016 Echo: EF 60-65%, no rwma, Gr1 DD, Ao sclerosis w/o stenosis, triv MR;  b. 07/2016 TEE: EF 55-60%, no rwma, mild MR.  . CKD (chronic kidney disease), stage III ([place])   . Coronary artery disease    a. 02/2007 Persantine MV: low risk;  b. 11/2011 CABG x 3 (LIMA->LAD, VG->OM, VG->RCA);  c. 05/2016 MV: EF >65%, no isch/infarct, horiz ST dep in I, II, V5-V6.  Marland Kitchen Depression   . Diabetes mellitus   . Diverticulosis   . Esophageal stricture   . GERD (gastroesophageal reflux disease)   . Hemorrhoids   . Hiatal hernia   . Hyperkalemia    a. ARB stopped due to this.  . Hyperlipidemia   . Hypertensive heart disease   . Hypothyroidism   . Mild cognitive impairment    a. seen by neurology.  Marland Kitchen PAF (paroxysmal atrial fibrillation) ([facility])    a. post-op CABG.  . Pain    RIGHT KNEE PAIN - TORN RIGHT MEDIAL MENISCUS  . Paroxysmal atrial flutter ([place])    a. 07/2016 s/p TEE & DCCV;  b. 07/2016 Recurrent PAFlutter req initiation of amio & PPM in setting of tachy-brady;  c. CHA2DS2VASc = 7-->Xarelto 15 mg QD.  Marland Kitchen PONV (postoperative nausea and vomiting)   . S/P CABG (coronary artery bypass graft), 12/04/11 12/07/2011   LIMA to LAD, SVG to OM, SVG to RCA  . Sinus bradycardia    a. not  on BB due to this.  . Tachy-brady syndrome ([place])    a. 07/2016 Jxnl brady following DCCV, recurrent Aflutter-->amio + SJM 2272 Assurity MRI DC PPM (ser # [id number]).    Past Surgical History:  Procedure Laterality Date  . ABDOMINAL HYSTERECTOMY  1980's  . BACK SURGERY  2006   "cyst growing near my spine"  . CARDIAC CATHETERIZATION  12/02/2011   mild LV dysfunction with mod hypocontractility of mid-distal anterolateral wall; CAD w/ostial tapering of L [place] with 50% diffuse ostial narrowing  of LAD, 99% eccentric focal prox LAD stenosis followed by 70% prox LAD stenosis after 1st diag, 20% mid LAD narrowing; 80% ostial-to-prox L Cfx stenosis & 40-50% irregularity of RCA (Dr. Corky Downs)  . CARDIOVERSION N/A 08/11/2016   Procedure: CARDIOVERSION;  Surgeon: Lelon Perla, MD;  Location: [facility];  Service: Cardiovascular;  Laterality: N/A;  . CATARACT EXTRACTION W/ INTRAOCULAR LENS  IMPLANT, BILATERAL  ~ 2010  . [place]  . CORONARY ARTERY BYPASS GRAFT  12/04/2011   Procedure: CORONARY ARTERY BYPASS GRAFTING (CABG);  Surgeon: Tharon Aquas Adelene Idler, MD;  Location: [facility];  Service: Open Heart Surgery;  Laterality: N/A;  CABG x three,  using left internal mammary artery, and right leg greater saphenous vein harvested endoscopically  . DILATION AND CURETTAGE OF UTERUS     "a couple times"  . EP IMPLANTABLE DEVICE N/A 08/12/2016   Procedure: Pacemaker Implant;  Surgeon: Will Meredith Leeds, MD;  Location: [facility];  Service: Cardiovascular;  Laterality: N/A;  . FRACTURE SURGERY     "put pins both side right ankle"  . JOINT REPLACEMENT    . KNEE ARTHROSCOPY WITH MEDIAL MENISECTOMY Right 07/02/2014   Procedure: RIGHT KNEE ARTHROSCOPY WITH PARTIAL MEDIAL MENISTECTOMY, ABRASION CONDROPLASTYU OF PATELLA,ABRASION CONDROPLASTY OF MEDIAL FEMEROL CONDYL, MICROFRACTURE OF MEDIAL FEMEROL CONDYL;  Surgeon: Tobi Bastos, MD;  Location: [facility];  Service: Orthopedics;  Laterality: Right;  .  LEFT HEART CATHETERIZATION WITH CORONARY ANGIOGRAM N/A 12/02/2011   Procedure: LEFT HEART CATHETERIZATION WITH CORONARY ANGIOGRAM;  Surgeon: Troy Sine, MD;  Location: [facility];  Service: Cardiovascular;  Laterality: N/A;  Coronary angiogram, possible PCI  . TEE WITHOUT CARDIOVERSION N/A 08/11/2016   Procedure: TRANSESOPHAGEAL ECHOCARDIOGRAM (TEE);  Surgeon: Lelon Perla, MD;  Location: [place] [facility];  Service: Cardiovascular;  Laterality: N/A;  . TONSILLECTOMY  1949  . TOTAL KNEE ARTHROPLASTY  ~ 2006   left  . TRANSTHORACIC ECHOCARDIOGRAM  02/19/2013   EF 36-62%, grade 1 diastolic dysfunction; mildly thickend/calcified AV leaflets; mildly calcidied MV annulus; mild TR    FAMHx:  Family History  Problem Relation Age of Onset  . Diabetes Mother   . CVA Mother   . Hypertension Mother   . Heart disease Father   . Hyperlipidemia Father   . Breast cancer Sister        x 3  . Heart disease Brother        x5; one with MI  . Heart disease Sister        x3  . Diabetes Sister        x3  . Lung cancer Sister   . Breast cancer Sister        x2  . Colon cancer Neg Hx     SOCHx:   reports that  has never smoked. she has never used smokeless tobacco. She reports that she drinks alcohol. She reports that she does not use drugs.  ALLERGIES:  Allergies  Allergen Reactions  . Clonidine Derivatives Other (See Comments)    Bradycardia and fatigue   . Crestor [Rosuvastatin Calcium] Other (See Comments)    Other reaction(s): tired and weak  . Epinephrine Other (See Comments)    Abnormal feeling. Dental exam/injection of local w/ epi.  Marland Kitchen Hydralazine Other (See Comments)    Nausea/gi upset   . Losartan Other (See Comments)    Hyperkalemia   . Other Other (See Comments)    MANGO'S - WHELPS ALL OVER  .  Sulfa Antibiotics Other (See Comments)    Unknown    ROS: Pertinent items noted in HPI and remainder of comprehensive ROS otherwise negative.  HOME MEDS: Current Outpatient  Medications  Medication Sig Dispense Refill  . amiodarone (PACERONE) 200 MG tablet Take 0.5 tablets (100 mg total) by mouth daily. 30 tablet 6  . amLODipine (NORVASC) 10 MG tablet Take 5 mg by mouth daily.    Marland Kitchen aspirin 81 MG chewable tablet Chew 1 tablet (81 mg total) by mouth daily. 30 tablet 1  . carvedilol (COREG) 6.25 MG tablet TAKE 1 TABLET BY MOUTH TWICE DAILY 60 tablet 5  . Cyanocobalamin (VITAMIN B-12 PO) Take 1 tablet by mouth daily.    Marland Kitchen doxazosin (CARDURA) 4 MG tablet TAKE ONE TABLET BY MOUTH ONCE DAILY (Patient taking differently: TAKE ONE HALF TABLET BY MOUTH ONCE DAILY) 30 tablet 6  . esomeprazole (NEXIUM) 20 MG capsule TAKE ONE CAPSULE BY MOUTH ONCE DAILY AT  12  NOON 90 capsule 1  . ferrous sulfate 325 (65 FE) MG EC tablet Take 325 mg by mouth daily with breakfast.    . furosemide (LASIX) 20 MG tablet Take 2 tablets (40 mg total) by mouth 2 (two) times daily. 120 tablet 3  . gabapentin (NEURONTIN) 300 MG capsule Take 300-600 mg by mouth See admin instructions. Take 300 mg by mouth in the morning and take 600 mg by mouth at bedtime    . insulin glargine (LANTUS) 100 UNIT/ML injection Inject 14 Units into the skin every morning.     Marland Kitchen levothyroxine (SYNTHROID) 88 MCG tablet Take 1 tablet by mouth daily.    . meclizine (ANTIVERT) 25 MG tablet Take 25 mg by mouth daily as needed for dizziness. For dizziness    . Multiple Vitamins-Minerals (PRESERVISION AREDS 2 PO) Take 1 tablet by mouth daily.    Vladimir Faster Glycol-Propyl Glycol (SYSTANE OP) Place 1 drop into both eyes 2 (two) times daily.     . Rivaroxaban (XARELTO) 15 MG TABS tablet Take 1 tablet (15 mg total) by mouth daily with supper. 90 tablet 0   No current facility-administered medications for this visit.     LABS/IMAGING: No results found for this or any previous visit (from the past 48 hour(s)). No results found.  VITALS: BP (!) 190/70   Pulse 65   Ht 5' (1.524 m)   Wt 149 lb (67.6 kg)   BMI 29.10 kg/m    EXAM: General appearance: alert and no distress Neck: no carotid bruit and no JVD Lungs: clear to auscultation bilaterally Heart: regular rate and rhythm, S1, S2 normal, no murmur, click, rub or gallop Abdomen: soft, non-tender; bowel sounds normal; no masses,  no organomegaly Extremities: edema Trace to 1+ bilateral lower extremity edema Pulses: 2+ and symmetric Skin: Skin color, texture, turgor normal. No rashes or lesions Neurologic: Grossly normal  EKG: Atrial paced rhythm with prolonged AV conduction at 65-personally reviewed  ASSESSMENT: 1. Coronary artery disease status post CABG x 3 vessels in 2013 2. Paroxysmal atrial flutter-CHADSVASC score of 5 on Xarelto 3. Tachybradycardia syndrome status post St. Jude permanent pacemaker (07/2016) 4. Diabetes type 2 - now on insulin 5. Hypertension 6. Dyslipidemia 7. Peripheral neuropathy - secondary to diabetes 8. Progressive memory loss 9. Low back pain and leg weakness  10. CKD 3  PLAN: 1.   Lanasia seems to be gaining weight with increased abdominal girth and lower extremity swelling, despite restarting Lasix.  She is currently on 40 mg daily and  her creatinine had risen.  We will need to increase her Lasix further to 40 mg twice daily and I like to repeat a metabolic profile on Friday.  If her creatinine worsens or there is no evidence for decreasing weight, she may need to be admitted for diuresis and nephrology evaluation.  Follow-up 1 months.  [name], MD, [facility], [place] Director of the [facility]id [facility]  Card[facility]ovascular [facility] Attending Cardiologist  Direct Dial: [phone]  Fax: [phone]  Website:  [name] 10/26/2017, 5:00 PM

## 2017-10-26 NOTE — Patient Instructions (Signed)
Your physician has recommended you make the following change in your medication -- INCREASE lasix to 40mg  twice daily  Your physician recommends that you return for lab work on Friday Jan 4   Your physician recommends that you schedule a follow-up appointment in 2-3 weeks with Dr. Debara Pickett

## 2017-10-31 LAB — COMPREHENSIVE METABOLIC PANEL
ALT: 10 IU/L (ref 0–32)
AST: 19 IU/L (ref 0–40)
Albumin/Globulin Ratio: 1.5 (ref 1.2–2.2)
Albumin: 4 g/dL (ref 3.5–4.8)
Alkaline Phosphatase: 92 IU/L (ref 39–117)
BUN/Creatinine Ratio: 23 (ref 12–28)
BUN: 64 mg/dL — ABNORMAL HIGH (ref 8–27)
Bilirubin Total: 0.2 mg/dL (ref 0.0–1.2)
CO2: 24 mmol/L (ref 20–29)
Calcium: 8.7 mg/dL (ref 8.7–10.3)
Chloride: 99 mmol/L (ref 96–106)
Creatinine, Ser: 2.77 mg/dL — ABNORMAL HIGH (ref 0.57–1.00)
GFR calc Af Amer: 18 mL/min/{1.73_m2} — ABNORMAL LOW (ref >59)
GFR calc non Af Amer: 16 mL/min/{1.73_m2} — ABNORMAL LOW (ref >59)
Globulin, Total: 2.7 g/dL (ref 1.5–4.5)
Glucose: 228 mg/dL — ABNORMAL HIGH (ref 65–99)
Potassium: 5.4 mmol/L — ABNORMAL HIGH (ref 3.5–5.2)
Sodium: 138 mmol/L (ref 134–144)
Total Protein: 6.7 g/dL (ref 6.0–8.5)

## 2017-11-01 ENCOUNTER — Telehealth: Payer: Self-pay | Admitting: Internal Medicine

## 2017-11-01 NOTE — Telephone Encounter (Signed)
Notes recorded by [name], MD on 10/31/2017 at 4:44 PM EST Creatinine is worse with diuresis. Decrease lasix back to 40 mg daily - needs to be seen by nephrologist soon.  Dr. Lemmie Evens  _______________ [facility]

## 2017-11-01 NOTE — Telephone Encounter (Signed)
New Message     Please call patient daughter with her results.

## 2017-11-01 NOTE — Telephone Encounter (Signed)
Follow up   Patient is returning call about mothers labs. Please call

## 2017-11-01 NOTE — Telephone Encounter (Signed)
Patient's daughter aware of results. Recommended med change per MD and nephrology appt before Jan 29 visit with Dr. Debara Pickett. Med list updated

## 2017-11-11 ENCOUNTER — Other Ambulatory Visit: Payer: Self-pay | Admitting: Internal Medicine

## 2017-11-11 NOTE — Telephone Encounter (Signed)
REFILL 

## 2017-11-22 ENCOUNTER — Ambulatory Visit (INDEPENDENT_AMBULATORY_CARE_PROVIDER_SITE_OTHER): Payer: Medicare Other | Admitting: *Deleted

## 2017-11-22 ENCOUNTER — Ambulatory Visit: Payer: Medicare Other | Admitting: Internal Medicine

## 2017-11-22 DIAGNOSIS — I495 Sick sinus syndrome: Secondary | ICD-10-CM | POA: Diagnosis not present

## 2017-11-22 NOTE — Progress Notes (Signed)
Remote pacemaker transmission.   

## 2017-11-23 ENCOUNTER — Ambulatory Visit: Payer: Medicare Other | Admitting: Internal Medicine

## 2017-11-24 ENCOUNTER — Encounter: Payer: Self-pay | Admitting: Cardiology

## 2017-12-07 LAB — CUP PACEART REMOTE DEVICE CHECK
Battery Remaining Longevity: 120 mo
Battery Remaining Percentage: 95.5 %
Battery Voltage: 3.01 V
Brady Statistic AP VP Percent: 50 %
Brady Statistic AP VS Percent: 50 %
Brady Statistic AS VP Percent: 1 %
Brady Statistic AS VS Percent: 1 %
Brady Statistic RA Percent Paced: 99 %
Brady Statistic RV Percent Paced: 50 %
Date Time Interrogation Session: 20190129111523
Implantable Lead Implant Date: 20171019
Implantable Lead Implant Date: 20171019
Implantable Lead Location: 753859
Implantable Lead Location: 753860
Implantable Pulse Generator Implant Date: 20171019
Lead Channel Impedance Value: 450 Ohm
Lead Channel Impedance Value: 960 Ohm
Lead Channel Pacing Threshold Amplitude: 0.5 V
Lead Channel Pacing Threshold Amplitude: 1 V
Lead Channel Pacing Threshold Pulse Width: 0.4 ms
Lead Channel Pacing Threshold Pulse Width: 0.4 ms
Lead Channel Sensing Intrinsic Amplitude: 1.3 mV
Lead Channel Sensing Intrinsic Amplitude: 12 mV
Lead Channel Setting Pacing Amplitude: 2 V
Lead Channel Setting Pacing Amplitude: 2.5 V
Lead Channel Setting Pacing Pulse Width: 0.4 ms
Lead Channel Setting Sensing Sensitivity: 2 mV
Pulse Gen Model: 2272
Pulse Gen Serial Number: 3180458

## 2018-01-09 ENCOUNTER — Telehealth: Payer: Self-pay | Admitting: Internal Medicine

## 2018-01-09 NOTE — Telephone Encounter (Signed)
Pt c/o Shortness Of Breath: STAT if SOB developed within the last 24 hours or pt is noticeably SOB on the phone  1. Are you currently SOB (can you hear that pt is SOB on the phone)?NO   2. How long have you been experiencing SOB? "going on for awhile"  3. Are you SOB when sitting or when up moving around? Moving around   4. Are you currently experiencing any other symptoms? Fatigue

## 2018-01-09 NOTE — Telephone Encounter (Signed)
Returned call to daughter (ok per DPR) who states patient has been experiencing worsening SOB.  States this has been going on for weeks.   States her weight has not drastically changed over the last few weeks, SOB only with exertion, no increase in swelling of abdominal distension.   Patient also had c/o of episode of CP last night radiating into her throat, she informed her daughter this has been coming and going over the last couple of days.   Daughter states patient does not want to go to the hospital. States patient is resting and does not experience the pain or SOB when resting.     Daughter has not spoken to patient this AM as she is usually not awake yet.     Daughter states her nephrologist changed her lasix to torsemide 40 daily and stopped her amlodipine and doxazosin.   Advised daughter if symptoms return or worsen to proceed to ER for evaluation.  Advised I would reach out to scheduler to assist with getting patient scheduled for appt.     Daughter aware and verbalized understanding.

## 2018-01-10 NOTE — Progress Notes (Signed)
Cardiology Office Note   Date:  01/11/2018   ID:  [name], DOB [birth year], MRN [id number]  PCP:  Leeroy Cha, MD  Cardiologist: Dr. Debara Pickett Chief Complaint  Patient presents with  . Follow-up  . Coronary Artery Disease  . Pacemaker Problem     History of Present Illness: [name] is a 77 y.o. female who presents for ongoing assessment and management of coronary artery disease, history of three-vessel CABG in 2013 (LIMA to LAD, SVG to OM, and SVG to right coronary artery) disease.  Other history includes sinus bradycardia status post [facility] Jude Surety MRI safe pacemaker, hypertension, intolerant of multiple antihypertensives.  Seen by EP, Dr. Curt Bears, who has her now on amiodarone with underlying atrial flutter noted on pacemaker interrogation.    She remains on Lasix for diastolic dysfunction, and is followed by nephrologist who is monitoring her creatinine and GFR.  The patient was also found to be hypertensive on last office visit on 10/26/2017 with Dr. Debara Pickett.  Patient is currently on Xarelto for CVA prophylaxis CHADS VASC 5.   On that last office visit she had gained some weight despite restarting Lasix, and therefore Lasix was increased to 40 mg twice daily, with a repeat metabolic profile. Labs: Sodium 142; potassium 6.0; chloride 108; CO2 21; glucose 98; BUN 28; creatinine 1.90.  The patient's Lasix was decreased back to 40 mg daily,and was advised to follow-up with nephrology.  The patient comes today appearing confused, having poor memory, complaining of feeling shaky.  She has missed appointments with EP for 1:1.  Her family states that she is becoming more depressed.  She is not remembering to take Xarelto in the evening as directed.  Follow-up labs revealed sodium 142 potassium 5.4 chloride 102 CO2 25 glucose 138 BUN 44 creatinine 2.24 (09/24/2017) she is to follow-up with PCP concerning thyroid disease.  Past Medical History:  Diagnosis Date  . Arthritis    "in my hands"  . Carotid artery disease ([place])    a. 40-59% bilaterally 10/2015.  Marland Kitchen Chronic diastolic CHF (congestive heart failure) ([place])    a. 05/2016 Echo: EF 60-65%, no rwma, Gr1 DD, Ao sclerosis w/o stenosis, triv MR;  b. 07/2016 TEE: EF 55-60%, no rwma, mild MR.  . CKD (chronic kidney disease), stage III ([place])   . Coronary artery disease    a. 02/2007 Persantine MV: low risk;  b. 11/2011 CABG x 3 (LIMA->LAD, VG->OM, VG->RCA);  c. 05/2016 MV: EF >65%, no isch/infarct, horiz ST dep in I, II, V5-V6.  Marland Kitchen Depression   . Diabetes mellitus   . Diverticulosis   . Esophageal stricture   . GERD (gastroesophageal reflux disease)   . Hemorrhoids   . Hiatal hernia   . Hyperkalemia    a. ARB stopped due to this.  . Hyperlipidemia   . Hypertensive heart disease   . Hypothyroidism   . Mild cognitive impairment    a. seen by neurology.  Marland Kitchen PAF (paroxysmal atrial fibrillation) ([facility])    a. post-op CABG.  . Pain    RIGHT KNEE PAIN - TORN RIGHT MEDIAL MENISCUS  . Paroxysmal atrial flutter ([place])    a. 07/2016 s/p TEE & DCCV;  b. 07/2016 Recurrent PAFlutter req initiation of amio & PPM in setting of tachy-brady;  c. CHA2DS2VASc = 7-->Xarelto 15 mg QD.  Marland Kitchen PONV (postoperative nausea and vomiting)   . S/P CABG (coronary artery bypass graft), 12/04/11 12/07/2011   LIMA to LAD, SVG to OM, SVG to RCA  .  Sinus bradycardia    a. not on BB due to this.  . Tachy-brady syndrome ([place])    a. 07/2016 Jxnl brady following DCCV, recurrent Aflutter-->amio + SJM 2272 Assurity MRI DC PPM (ser # [id number]).    Past Surgical History:  Procedure Laterality Date  . ABDOMINAL HYSTERECTOMY  1980's  . BACK SURGERY  2006   "cyst growing near my spine"  . CARDIAC CATHETERIZATION  12/02/2011   mild LV dysfunction with mod hypocontractility of mid-distal anterolateral wall; CAD w/ostial tapering of L Main with 50% diffuse ostial narrowing of LAD, 99% eccentric focal prox LAD stenosis followed by 70% prox LAD stenosis after 1st diag,  20% mid LAD narrowing; 80% ostial-to-prox L Cfx stenosis & 40-50% irregularity of RCA (Dr. Corky Downs)  . CARDIOVERSION N/A 08/11/2016   Procedure: CARDIOVERSION;  Surgeon: Lelon Perla, MD;  Location: [place];  Service: Cardiovascular;  Laterality: N/A;  . CATARACT EXTRACTION W/ INTRAOCULAR LENS  IMPLANT, BILATERAL  ~ 2010  . [place]  . CORONARY ARTERY BYPASS GRAFT  12/04/2011   Procedure: CORONARY ARTERY BYPASS GRAFTING (CABG);  Surgeon: Tharon Aquas Adelene Idler, MD;  Location: [place] [facility];  Service: Open Heart Surgery;  Laterality: N/A;  CABG x three,  using left internal mammary artery, and right leg greater saphenous vein harvested endoscopically  . DILATION AND CURETTAGE OF UTERUS     "a couple times"  . EP IMPLANTABLE DEVICE N/A 08/12/2016   Procedure: Pacemaker Implant;  Surgeon: Will Meredith Leeds, MD;  Location: [facility];  Service: Cardiovascular;  Laterality: N/A;  . FRACTURE SURGERY     "put pins both side right ankle"  . JOINT REPLACEMENT    . KNEE ARTHROSCOPY WITH MEDIAL MENISECTOMY Right 07/02/2014   Procedure: RIGHT KNEE ARTHROSCOPY WITH PARTIAL MEDIAL MENISTECTOMY, ABRASION CONDROPLASTYU OF PATELLA,ABRASION CONDROPLASTY OF MEDIAL FEMEROL CONDYL, MICROFRACTURE OF MEDIAL FEMEROL CONDYL;  Surgeon: Tobi Bastos, MD;  Location: [facility];  Service: Orthopedics;  Laterality: Right;  . LEFT HEART CATHETERIZATION WITH CORONARY ANGIOGRAM N/A 12/02/2011   Procedure: LEFT HEART CATHETERIZATION WITH CORONARY ANGIOGRAM;  Surgeon: Troy Sine, MD;  Location: [facility];  Service: Cardiovascular;  Laterality: N/A;  Coronary angiogram, possible PCI  . TEE WITHOUT CARDIOVERSION N/A 08/11/2016   Procedure: TRANSESOPHAGEAL ECHOCARDIOGRAM (TEE);  Surgeon: Lelon Perla, MD;  Location: [facility];  Service: Cardiovascular;  Laterality: N/A;  . TONSILLECTOMY  1949  . TOTAL KNEE ARTHROPLASTY  ~ 2006   left  . TRANSTHORACIC ECHOCARDIOGRAM  02/19/2013   EF 55-60%, grade 1  diastolic dysfunction; mildly thickend/calcified AV leaflets; mildly calcidied MV annulus; mild TR     Current Outpatient Medications  Medication Sig Dispense Refill  . amiodarone (PACERONE) 200 MG tablet Take 0.5 tablets (100 mg total) by mouth daily. 30 tablet 6  . aspirin 81 MG chewable tablet Chew 1 tablet (81 mg total) by mouth daily. 30 tablet 1  . carvedilol (COREG) 6.25 MG tablet TAKE 1 TABLET BY MOUTH TWICE DAILY 60 tablet 5  . Cyanocobalamin (VITAMIN B-12 PO) Take 1 tablet by mouth daily.    Marland Kitchen esomeprazole (NEXIUM) 20 MG capsule TAKE ONE CAPSULE BY MOUTH ONCE DAILY AT  12  NOON 90 capsule 1  . ferrous sulfate 325 (65 FE) MG EC tablet Take 325 mg by mouth daily with breakfast.    . gabapentin (NEURONTIN) 300 MG capsule Take 300-600 mg by mouth See admin instructions. Take 300 mg by mouth in the morning and take  600 mg by mouth at bedtime    . insulin glargine (LANTUS) 100 UNIT/ML injection Inject 14 Units into the skin every morning.     Marland Kitchen levothyroxine (SYNTHROID) 88 MCG tablet Take 1 tablet by mouth daily.    . meclizine (ANTIVERT) 25 MG tablet Take 25 mg by mouth daily as needed for dizziness. For dizziness    . Multiple Vitamins-Minerals (PRESERVISION AREDS 2 PO) Take 1 tablet by mouth daily.    Vladimir Faster Glycol-Propyl Glycol (SYSTANE OP) Place 1 drop into both eyes 2 (two) times daily.     . Rivaroxaban (XARELTO) 15 MG TABS tablet Take 1 tablet (15 mg total) by mouth daily with supper. 90 tablet 0  . torsemide (DEMADEX) 20 MG tablet Take 40 mg by mouth every morning.     No current facility-administered medications for this visit.     Allergies:   Clonidine derivatives; Crestor [rosuvastatin calcium]; Epinephrine; Hydralazine; Losartan; Other; and Sulfa antibiotics    Social History:  The patient  reports that  has never smoked. she has never used smokeless tobacco. She reports that she drinks alcohol. She reports that she does not use drugs.   Family History:  The  patient's family history includes Breast cancer in her sister and sister; CVA in her mother; Diabetes in her mother and sister; Heart disease in her brother, father, and sister; Hyperlipidemia in her father; Hypertension in her mother; Lung cancer in her sister.    ROS: All other systems are reviewed and negative. Unless otherwise mentioned in H&P    PHYSICAL EXAM: VS:  BP (!) 144/72   Pulse 62   Ht 5' (1.524 m)   Wt 144 lb 3.2 oz (65.4 kg)   BMI 28.16 kg/m  , BMI Body mass index is 28.16 kg/m. GEN: Well nourished, well developed, in no acute distress  HEENT: normal  Neck: no JVD, carotid bruits, or masses Cardiac:RRR; no murmurs, rubs, or gallops,no edema  Respiratory:  Clear to auscultation bilaterally, normal work of breathing GI: soft, nontender, nondistended, + BS MS: no deformity or atrophy  Skin: warm and dry, no rash.  Pale Neuro:  Strength and sensation are intact Psych: euthymic mood, flat l affect, poor memory   Recent Labs: 04/22/2017: TSH 3.980 10/10/2017: Hemoglobin 10.5; NT-Pro BNP 1,437; Platelets 223 10/31/2017: ALT 10; BUN 64; Creatinine, Ser 2.77; Potassium 5.4; Sodium 138    Lipid Panel    Component Value Date/Time   CHOL 330 (H) 08/08/2016 2130   TRIG 313 (H) 08/08/2016 2130   HDL 42 08/08/2016 2130   CHOLHDL 7.9 08/08/2016 2130   VLDL 63 (H) 08/08/2016 2130   LDLCALC 225 (H) 08/08/2016 2130      Wt Readings from Last 3 Encounters:  01/11/18 144 lb 3.2 oz (65.4 kg)  10/26/17 149 lb (67.6 kg)  08/25/17 146 lb 3.2 oz (66.3 kg)      Other studies Reviewed: Echocardiogram Sep 09, 2016 Left ventricle: Systolic function was normal. The estimated   ejection fraction was in the range of 55% to 60%. Wall motion was   normal; there were no regional wall motion abnormalities. - Aortic valve: No evidence of vegetation. - Mitral valve: No evidence of vegetation. There was mild   regurgitation. - Left atrium: The atrium was mildly dilated. No evidence of    thrombus in the atrial cavity or appendage. - Right atrium: No evidence of thrombus in the atrial cavity or   appendage. - Atrial septum: No defect or patent foramen ovale  was identified. - Tricuspid valve: No evidence of vegetation. - Pulmonic valve: No evidence of vegetation.  Impressions:  - Normal LV systolic function; mild LAE; no LAA thrombus; calcified   subvalvular tricuspid apparatus; mild MR.  ASSESSMENT AND PLAN:  1.  Paroxysmal atrial fibrillation: Heart rate is currently well controlled on amiodarone use which has been reduced to 100 mg daily) the setting of feeling fatigue and jittery.  She also continues on carvedilol 6.25 mg twice daily and on Xarelto 15 mg daily.  She is not  taking Xarelto persistently in the evening as directed, therefore I have asked her to take it in the morning with her other medications when she eats her breakfast so that she will not forget.  2.  Hypertension: Currently mildly elevated but we will not make any changes at this time.  She is quite anxious.  3.  CAD: Denies significant discomfort or dyspnea on exertion.  She has generalized fatigue however.  4.  Decreased cognition: The patient do well on Mini-Mental status exam questionnaire, was not able to recall the first 3 words I asked her to remember .  She is due to see neurology for days.  They may need to do further evaluation.  I consider doing a CT scan due to her worsening forgetfulness and inconsistency taking Xarelto.  Daughter requests that it be done at neurology appointment.  She does also exhibit evidence of depression  5.  Pacemaker in situ: We will make appointment with pacemaker clinic for 1-1 and person visit and ongoing pacemaker interrogation as per protocol.  They may need to decide whether or not keeping her on amiodarone and adjusting carvedilol is appropriate as she states that the amiodarone is making her feel shaky..  Current medicines are reviewed at length with the  patient today.    Labs/ tests ordered today include: None.  Due to have labs within the next 4 days by neurology and PCP.  Phill Myron. West Pugh, ANP, AACC   01/11/2018 11:55 AM    [facility] [address], [place], [place] [address] Phone: [phone]; Fax: [phone]

## 2018-01-11 ENCOUNTER — Ambulatory Visit: Payer: Medicare Other | Admitting: Adult Health

## 2018-01-11 ENCOUNTER — Encounter: Payer: Self-pay | Admitting: Adult Health

## 2018-01-11 VITALS — BP 144/72 | HR 62 | Ht 60.0 in | Wt 144.2 lb

## 2018-01-11 DIAGNOSIS — I480 Paroxysmal atrial fibrillation: Secondary | ICD-10-CM

## 2018-01-11 DIAGNOSIS — I10 Essential (primary) hypertension: Secondary | ICD-10-CM

## 2018-01-11 DIAGNOSIS — Z79899 Other long term (current) drug therapy: Secondary | ICD-10-CM | POA: Diagnosis not present

## 2018-01-11 DIAGNOSIS — N183 Chronic kidney disease, stage 3 unspecified: Secondary | ICD-10-CM

## 2018-01-11 DIAGNOSIS — I2510 Atherosclerotic heart disease of native coronary artery without angina pectoris: Secondary | ICD-10-CM

## 2018-01-11 NOTE — Patient Instructions (Signed)
Medication Instructions:  MAKE SURE TO TAKE XARELTO DAILY  If you need a refill on your cardiac medications before your next appointment, please call your pharmacy.  Special Instructions: NEEDS EP-PACER FOLLOW UP APPOINTMENT-MISSED THE LAST APPT.  CALL AND MAKE AN APPOINTMENT WITH DR TAT-NEUROLOGIST  Follow-Up: Your physician wants you to follow-up in: 3 MONTHS WITH DR HILTY.   Thank you for choosing [facility] at [facility]!!

## 2018-01-12 ENCOUNTER — Ambulatory Visit: Payer: Medicare Other | Admitting: Physician Assistant

## 2018-02-01 ENCOUNTER — Other Ambulatory Visit: Payer: Self-pay | Admitting: Internal Medicine

## 2018-02-01 ENCOUNTER — Encounter: Payer: Self-pay | Admitting: Cardiology

## 2018-02-01 ENCOUNTER — Other Ambulatory Visit: Payer: Self-pay | Admitting: Cardiology

## 2018-02-02 NOTE — Telephone Encounter (Signed)
REFILL 

## 2018-02-06 ENCOUNTER — Telehealth: Payer: Self-pay | Admitting: Cardiology

## 2018-02-06 ENCOUNTER — Encounter: Payer: Self-pay | Admitting: Cardiology

## 2018-02-06 ENCOUNTER — Ambulatory Visit: Payer: Medicare Other | Admitting: Cardiology

## 2018-02-06 VITALS — BP 140/76 | HR 60 | Ht 61.0 in | Wt 145.0 lb

## 2018-02-06 DIAGNOSIS — I10 Essential (primary) hypertension: Secondary | ICD-10-CM | POA: Diagnosis not present

## 2018-02-06 DIAGNOSIS — I495 Sick sinus syndrome: Secondary | ICD-10-CM

## 2018-02-06 DIAGNOSIS — I480 Paroxysmal atrial fibrillation: Secondary | ICD-10-CM

## 2018-02-06 DIAGNOSIS — Z79899 Other long term (current) drug therapy: Secondary | ICD-10-CM | POA: Diagnosis not present

## 2018-02-06 DIAGNOSIS — Z950 Presence of cardiac pacemaker: Secondary | ICD-10-CM | POA: Diagnosis not present

## 2018-02-06 DIAGNOSIS — I25810 Atherosclerosis of coronary artery bypass graft(s) without angina pectoris: Secondary | ICD-10-CM

## 2018-02-06 LAB — CUP PACEART INCLINIC DEVICE CHECK
Battery Remaining Longevity: 117 mo
Battery Voltage: 3.01 V
Brady Statistic RA Percent Paced: 99.69 %
Brady Statistic RV Percent Paced: 42 %
Date Time Interrogation Session: 20190415152200
Implantable Lead Implant Date: 20171019
Implantable Lead Implant Date: 20171019
Implantable Lead Location: 753859
Implantable Lead Location: 753860
Implantable Pulse Generator Implant Date: 20171019
Lead Channel Impedance Value: 437.5 Ohm
Lead Channel Impedance Value: 937.5 Ohm
Lead Channel Pacing Threshold Amplitude: 0.5 V
Lead Channel Pacing Threshold Amplitude: 1 V
Lead Channel Pacing Threshold Pulse Width: 0.4 ms
Lead Channel Pacing Threshold Pulse Width: 0.4 ms
Lead Channel Sensing Intrinsic Amplitude: 12 mV
Lead Channel Setting Pacing Amplitude: 2 V
Lead Channel Setting Pacing Amplitude: 2.5 V
Lead Channel Setting Pacing Pulse Width: 0.4 ms
Lead Channel Setting Sensing Sensitivity: 2 mV
Pulse Gen Model: 2272
Pulse Gen Serial Number: 3180458

## 2018-02-06 MED ORDER — AMIODARONE HCL 200 MG PO TABS: 200.0000 mg | ORAL_TABLET | ORAL | 11 refills | Status: DC

## 2018-02-06 MED ORDER — AMIODARONE HCL 200 MG PO TABS: 100.0000 mg | ORAL_TABLET | Freq: Every day | ORAL | 11 refills | Status: DC

## 2018-02-06 NOTE — Addendum Note (Signed)
Addended by: Stanton Kidney on: 02/06/2018 11:57 AM   Modules accepted: Orders

## 2018-02-06 NOTE — Telephone Encounter (Signed)
Spoke to pharmacy earlier and informed them of mistake and which Rx is correct.

## 2018-02-06 NOTE — Addendum Note (Signed)
Addended by: Stanton Kidney on: 02/06/2018 11:53 AM   Modules accepted: Orders

## 2018-02-06 NOTE — Patient Instructions (Addendum)
Medication Instructions:  Your physician has recommended you make the following change in your medication:  1. DECREASE Amiodarone to 100 mg every other day.  *If you need a refill on your cardiac medications before your next appointment, please call your pharmacy*  Labwork: Today: TSH  Testing/Procedures: None ordered  Follow-Up: Remote monitoring is used to monitor your Pacemaker or ICD from home. This monitoring reduces the number of office visits required to check your device to one time per year. It allows [place] to keep an eye on the functioning of your device to ensure it is working properly. You are scheduled for a device check from home on 02/21/2018. You may send your transmission at any time that day. If you have a wireless device, the transmission will be sent automatically. After your physician reviews your transmission, you will receive a postcard with your next transmission date.  Your physician wants you to follow-up in: 1 year with Dr. Curt Bears.  You will receive a reminder letter in the mail two months in advance. If you don't receive a letter, please call our office to schedule the follow-up appointment.  Thank you for choosing [facility]!!   Trinidad Curet, RN [phone]

## 2018-02-06 NOTE — Telephone Encounter (Signed)
Pt's pharmacy calling stating that pt's medication of Amiodarone 200 mg tablet was sent in different numerous time and still the medication is incorrect. Pt told the pharmacy that she takes this medication every other day and that is not how medication was sent in. Please address

## 2018-02-06 NOTE — Progress Notes (Signed)
Electrophysiology Office Note   Date:  02/06/2018   ID:  [name], DOB [birth year], MRN [id number]  PCP:  Leeroy Cha, MD  Cardiologist:  Debara Pickett Primary Electrophysiologist:  Jahnay Lantier Meredith Leeds, MD    Chief Complaint  Patient presents with  . Pacemaker Check    PAF/Tachycardia-bradycardia syndrome     History of Present Illness: [name] is a 77 y.o. female who presents today for electrophysiology evaluation.   She has a history of atypical atrial flutter, sinus bradycardia, hypertension, and coronary artery disease status post CABG. She had a TEE and cardioversion in October for atypical atrial flutter. She was having exercise intolerance and fatigue prior to her cardioversion. Had dual chamber pacemaker placed 08/12/16.   Today, denies symptoms of palpitations, chest pain, shortness of breath, orthopnea, PND, lower extremity edema, claudication, dizziness, presyncope, syncope, bleeding, or neurologic sequela. The patient is tolerating medications without difficulties.  Overall she is feeling well.  She is continued to have occasional shaking in her hands.  She otherwise feels fairly normal.   Past Medical History:  Diagnosis Date  . Arthritis    "in my hands"  . Carotid artery disease ([place])    a. 40-59% bilaterally 10/2015.  [name] Kitchen Chronic diastolic CHF (congestive heart failure) ([place])    a. 05/2016 Echo: EF 60-65%, no rwma, Gr1 DD, Ao sclerosis w/o stenosis, triv MR;  b. 07/2016 TEE: EF 55-60%, no rwma, mild MR.  . CKD (chronic kidney disease), stage III ([place])   . Coronary artery disease    a. 02/2007 Pers[place] MV: low risk;  b. 11/2011 CABG x 3 (LIMA->LAD, VG->OM, VG->RCA);  c. 05/2016 MV: EF >65%, no isch/infarct, horiz ST dep in I, II, V5-V6.  [name] Kitchen Depression   . Diabetes mellitus   . Diverticulosis   . Esophageal stricture   . GERD (gastroesophageal reflux disease)   . Hemorrhoids   . Hiatal hernia   . Hyperkalemia    a. ARB stopped due to this.  .  Hyperlipidemia   . Hypertensive heart disease   . Hypothyroidism   . Mild cognitive impairment    a. seen by neurology.  [name] Kitchen PAF (paroxysmal atrial fibrillation) ([facility])    a. post-op CABG.  . Pain    RIGHT KNEE PAIN - TORN RIGHT MEDIAL MENISCUS  . Paroxysmal atrial flutter ([place])    a. 07/2016 s/p TEE & DCCV;  b. 07/2016 Recurrent PAFlutter req initiation of amio & PPM in setting of tachy-brady;  c. CHA2DS2VASc = 7-->Xarelto 15 mg QD.  [name] Kitchen PONV (postoperative nausea and vomiting)   . S/P CABG (coronary artery bypass graft), 12/04/11 12/07/2011   LIMA to LAD, SVG to OM, SVG to RCA  . Sinus bradycardia    a. not on BB due to this.  . Tachy-brady syndrome ([place])    a. 07/2016 Jxnl brady following DCCV, recurrent Aflutter-->amio + SJM 2272 Assurity MRI DC PPM (ser # [id number]).   Past Surgical History:  Procedure Laterality Date  . ABDOMINAL HYSTERECTOMY  1980's  . BACK SURGERY  2006   "cyst growing near my spine"  . CARDIAC CATHETERIZATION  12/02/2011   mild LV dysfunction with mod hypocontractility of mid-distal anterolateral wall; CAD w/ostial tapering of L Main with 50% diffuse ostial narrowing of LAD, 99% eccentric focal prox LAD stenosis followed by 70% prox LAD stenosis after 1st diag, 20% mid LAD narrowing; 80% ostial-to-prox L Cfx stenosis & 40-50% irregularity of RCA (Dr. Corky Downs)  . CARDIOVERSION N/A 08/11/2016  Procedure: CARDIOVERSION;  Surgeon: Lelon Perla, MD;  Location: [facility];  Service: Cardiovascular;  Laterality: N/A;  . CATARACT EXTRACTION W/ INTRAOCULAR LENS  IMPLANT, BILATERAL  ~ 2010  . [place]  . CORONARY ARTERY BYPASS GRAFT  12/04/2011   Procedure: CORONARY ARTERY BYPASS GRAFTING (CABG);  Surgeon: Tharon Aquas Adelene Idler, MD;  Location: [facility];  Service: Open Heart Surgery;  Laterality: N/A;  CABG x three,  using left internal mammary artery, and right leg greater saphenous vein harvested endoscopically  . DILATION AND CURETTAGE OF UTERUS     "a couple  times"  . EP IMPLANTABLE DEVICE N/A 08/12/2016   Procedure: Pacemaker Implant;  Surgeon: Vernessa Likes Meredith Leeds, MD;  Location: [facility];  Service: Cardiovascular;  Laterality: N/A;  . FRACTURE SURGERY     "put pins both side right ankle"  . JOINT REPLACEMENT    . KNEE ARTHROSCOPY WITH MEDIAL MENISECTOMY Right 07/02/2014   Procedure: RIGHT KNEE ARTHROSCOPY WITH PARTIAL MEDIAL MENISTECTOMY, ABRASION CONDROPLASTYU OF PATELLA,ABRASION CONDROPLASTY OF MEDIAL FEMEROL CONDYL, MICROFRACTURE OF MEDIAL FEMEROL CONDYL;  Surgeon: Tobi Bastos, MD;  Location: [facility];  Service: Orthopedics;  Laterality: Right;  . LEFT HEART CATHETERIZATION WITH CORONARY ANGIOGRAM N/A 12/02/2011   Procedure: LEFT HEART CATHETERIZATION WITH CORONARY ANGIOGRAM;  Surgeon: Troy Sine, MD;  Location: [facility];  Service: Cardiovascular;  Laterality: N/A;  Coronary angiogram, possible PCI  . TEE WITHOUT CARDIOVERSION N/A 08/11/2016   Procedure: TRANSESOPHAGEAL ECHOCARDIOGRAM (TEE);  Surgeon: Lelon Perla, MD;  Location: [facility];  Service: Cardiovascular;  Laterality: N/A;  . TONSILLECTOMY  1949  . TOTAL KNEE ARTHROPLASTY  ~ 2006   left  . TRANSTHORACIC ECHOCARDIOGRAM  02/19/2013   EF 64-40%, grade 1 diastolic dysfunction; mildly thickend/calcified AV leaflets; mildly calcidied MV annulus; mild TR     Current Outpatient Medications  Medication Sig Dispense Refill  . aspirin 81 MG chewable tablet Chew 1 tablet (81 mg total) by mouth daily. 30 tablet 1  . carvedilol (COREG) 6.25 MG tablet TAKE 1 TABLET BY MOUTH TWICE DAILY 60 tablet 5  . Cyanocobalamin (VITAMIN B-12 PO) Take 1 tablet by mouth daily.    [name] Kitchen esomeprazole (NEXIUM) 20 MG capsule TAKE ONE CAPSULE BY MOUTH ONCE DAILY AT  12  NOON 90 capsule 1  . ferrous sulfate 325 (65 FE) MG EC tablet Take 325 mg by mouth daily with breakfast.    . gabapentin (NEURONTIN) 300 MG capsule Take 300-600 mg by mouth See admin instructions. Take 300 mg by mouth in the  morning and take 600 mg by mouth at bedtime    . insulin glargine (LANTUS) 100 UNIT/ML injection Inject 14 Units into the skin every morning.     [name] Kitchen levothyroxine (SYNTHROID) 88 MCG tablet Take 1 tablet by mouth daily.    . meclizine (ANTIVERT) 25 MG tablet Take 25 mg by mouth daily as needed for dizziness. For dizziness    . Multiple Vitamins-Minerals (PRESERVISION AREDS 2 PO) Take 1 tablet by mouth daily.    [name] (SYSTANE OP) Place 1 drop into both eyes 2 (two) times daily.     . Rivaroxaban (XARELTO) 15 MG TABS tablet Take 1 tablet (15 mg total) by mouth daily with supper. 90 tablet 0  . torsemide (DEMADEX) 20 MG tablet Take 40 mg by mouth every morning.    [name] Kitchen amiodarone (PACERONE) 200 MG tablet Take 1 tablet (200 mg total) by mouth every other day. 15  tablet 11   No current facility-administered medications for this visit.     Allergies:   Clonidine derivatives; Crestor [rosuvastatin calcium]; Epinephrine; Hydralazine; Losartan; Other; and Sulfa antibiotics   Social History:  The patient  reports that she has never smoked. She has never used smokeless tobacco. She reports that she drinks alcohol. She reports that she does not use drugs.   Family History:  The patient's family history includes Breast cancer in her sister and sister; CVA in her mother; Diabetes in her mother and sister; Heart disease in her brother, father, and sister; Hyperlipidemia in her father; Hypertension in her mother; Lung cancer in her sister.   ROS:  Please see the history of present illness.   Otherwise, review of systems is positive for weight change, dyspnea on exertion, wheezing, dizziness.   All other systems are reviewed and negative.   PHYSICAL EXAM: VS:  BP 140/76   Pulse 60   Ht 5\' 1"  (1.549 m)   Wt 145 lb (65.8 kg)   SpO2 96%   BMI 27.40 kg/m  , BMI Body mass index is 27.4 kg/m. GEN: Well nourished, well developed, in no acute distress  HEENT: normal  Neck: no JVD,  carotid bruits, or masses Cardiac: RRR; no murmurs, rubs, or gallops,no edema  Respiratory:  clear to auscultation bilaterally, normal work of breathing GI: soft, nontender, nondistended, + BS MS: no deformity or atrophy  Skin: warm and dry, device site well healed Neuro:  Strength and sensation are intact Psych: euthymic mood, full affect  EKG:  EKG is ordered today. Personal review of the ekg ordered shows atrial paced, inferolateral T wave inversions, rate 60  Personal review of the device interrogation today. Results in [facility]: 04/22/2017: TSH 3.980 10/10/2017: Hemoglobin 10.5; NT-Pro BNP 1,437; Platelets 223 10/31/2017: ALT 10; BUN 64; Creatinine, Ser 2.77; Potassium 5.4; Sodium 138    Lipid Panel     Component Value Date/Time   CHOL 330 (H) 08/08/2016 2130   TRIG 313 (H) 08/08/2016 2130   HDL 42 08/08/2016 2130   CHOLHDL 7.9 08/08/2016 2130   VLDL 63 (H) 08/08/2016 2130   LDLCALC 225 (H) 08/08/2016 2130     Wt Readings from Last 3 Encounters:  02/06/18 145 lb (65.8 kg)  01/11/18 144 lb 3.2 oz (65.4 kg)  10/26/17 149 lb (67.6 kg)      Other studies Reviewed: Additional studies/ records that were reviewed today include: TTE 05/25/16  Review of the above records today demonstrates:  - Left ventricle: The cavity size was normal. Wall thickness was   normal. Systolic function was normal. The estimated ejection   fraction was in the range of 60% to 65%. Wall motion was normal;   there were no regional wall motion abnormalities. Doppler   parameters are consistent with abnormal left ventricular   relaxation (grade 1 diastolic dysfunction). The E/e&' ratio is   between 8-15, suggesting indeterminate LV filling pressure. - Aortic valve: Trileaflet. Sclerosis without stenosis.   Transvalvular velocity was minimally increased. There was no   stenosis. There was no regurgitation. - Mitral valve: Mildly thickened leaflets . There was trivial    regurgitation. - Left atrium: The atrium was normal in size. - Tricuspid valve: There was no significant regurgitation. - Inferior vena cava: The vessel was normal in size. The   respirophasic diameter changes were in the normal range (>= 50%),   consistent with normal central venous pressure.   ASSESSMENT AND PLAN:  1. Atypical atrial flutter Version 07/2017 with recurrent atrial flutter.  She has been put on Xarelto and amiodarone.  She continues to have episodes of shaking.  We Jazzmyn Filion decrease amiodarone to 100 mg every other day.  This patients CHA2DS2-VASc Score and unadjusted Ischemic Stroke Rate (% per year) is equal to 9.7 % stroke rate/year from a score of 6  Above score calculated as 1 point each if present [CHF, HTN, DM, Vascular=MI/PAD/Aortic Plaque, Age if 65-74, or Female] Above score calculated as 2 points each if present [Age > 75, or Stroke/TIA/TE]  2. Tachybradycardia syndrome Saint Jude dual-chamber pacemaker implanted 08/12/16.  Has remained in sinus rhythm.  Is atrial dependent today.    3. HTN  Well-controlled today.  No changes.  4. CAD s/p CABG No ischemic symptoms currently.  She did have an episode of chest pain a few months ago.  If further chest pain episodes happen, Sherolyn Trettin potentially add long-acting nitrates.    Current medicines are reviewed at length with the patient today.   The patient does not have concerns regarding her medicines.  The following changes were made today: Decrease amiodarone  Labs/ tests ordered today include:  Orders Placed This Encounter  Procedures  . TSH  . EKG 12-Lead     Disposition:   FU with [name] 12 months  Signed, Heith Haigler Meredith Leeds, MD  02/06/2018 11:45 AM     [facility] [address] [place] [address] [phone] (office) [phone] (fax)

## 2018-02-07 LAB — TSH: TSH: 9.24 u[IU]/mL — ABNORMAL HIGH (ref 0.450–4.500)

## 2018-02-09 ENCOUNTER — Telehealth: Payer: Self-pay | Admitting: *Deleted

## 2018-02-09 NOTE — Telephone Encounter (Signed)
Informed patient of results and verbal understanding expressed. Advised to stop Amiodarone. Will fax abn lab result to PCP to follow/address per Dr. Curt Bears. Advised patient to follow up w/ PCP is hasn't heard from them by end of next week.

## 2018-02-09 NOTE — Telephone Encounter (Signed)
-----   Message from Will Meredith Leeds, MD sent at 02/07/2018  8:17 AM EDT ----- TSH significantly elevated. Would stop amio and have her follow up with PCP to discuss.

## 2018-02-21 ENCOUNTER — Ambulatory Visit (INDEPENDENT_AMBULATORY_CARE_PROVIDER_SITE_OTHER): Payer: Medicare Other | Admitting: *Deleted

## 2018-02-21 DIAGNOSIS — I495 Sick sinus syndrome: Secondary | ICD-10-CM

## 2018-02-21 NOTE — Progress Notes (Signed)
Remote pacemaker transmission.   

## 2018-02-22 ENCOUNTER — Encounter: Payer: Self-pay | Admitting: Cardiology

## 2018-02-22 NOTE — Progress Notes (Signed)
Letter  

## 2018-02-28 DIAGNOSIS — M48061 Spinal stenosis, lumbar region without neurogenic claudication: Secondary | ICD-10-CM | POA: Insufficient documentation

## 2018-03-03 ENCOUNTER — Telehealth: Payer: Self-pay | Admitting: Internal Medicine

## 2018-03-03 ENCOUNTER — Other Ambulatory Visit: Payer: Self-pay | Admitting: Orthopedic Surgery

## 2018-03-03 DIAGNOSIS — M48061 Spinal stenosis, lumbar region without neurogenic claudication: Secondary | ICD-10-CM

## 2018-03-03 NOTE — Telephone Encounter (Signed)
   [facility] Pre-operative Risk Assessment    Request for surgical clearance:  1. What type of surgery is being performed? CT myelogram   2. When is this surgery scheduled? TBD   3. What type of clearance is required (medical clearance vs. Pharmacy clearance to hold med vs. Both)? Medical   4. Are there any medications that need to be held prior to surgery and how long? Xarelto - unknown duration   5. Practice name and name of physician performing surgery? Dr. Gladstone Lighter with [facility]   6. What is your office phone number [phone]      7.   What is your office fax number [phone]  8.   Anesthesia type (None, local, MAC, general) ? None    Sheral Apley M 03/03/2018, 1:41 PM  _________________________________________________________________   (provider comments below)

## 2018-03-06 NOTE — Telephone Encounter (Signed)
I received this up dated surgical clearance requesting Xarelto be stopped for one day prior to surgery. The appointment will be scheduled following your response.

## 2018-03-07 NOTE — Telephone Encounter (Signed)
Hold Xarelto for 3 days prior to procedure and restart when safe afterwards from a bleeding standpoint. Will have to monitor GFR post-procedure.  Dr. Lemmie Evens

## 2018-03-07 NOTE — Telephone Encounter (Signed)
Pt takes Xarelto for afib with CHADS2VASc score of 7 (age x2, sex, HTN, CHF, DM, CAD). SCr 2.77, CrCl 64mL/min. Pt appropriately on lower dose of Xarelto 15mg  daily, however CrCl is close to lower cutoff for avoiding Xarelto use due to poor clearance.   Typically hold DOACs for 3 days prior to spinal procedures, however pt is at elevated risk with CHADS2VASc score of 7. On the other hand, renal function is poor and pt is likely eliminating Xarelto slowly, although per recent note pt has been forgetful and does not always remember to take her Xarelto.  Will route to MD for input regarding recommended duration of holding Xarelto prior to spinal procedure with elevated cardiac risk but poor renal clearance.

## 2018-03-08 NOTE — Telephone Encounter (Signed)
   Primary Cardiologist: [name], MD  Chart reviewed by Dr Debara Pickett as part of pre-operative protocol coverage. Given past medical history and time since last visit, based on ACC/AHA guidelines, [name] would be at acceptable risk for the planned procedure without further cardiovascular testing.  OK to hold Xarelto 3 days pre op, resume when able post op. Monitor GFR post procedure.   I will route this recommendation to the requesting party via Epic fax function and remove from pre-op pool.  Please call with questions.  Kerin Ransom, PA-C 03/08/2018, 8:42 AM

## 2018-03-14 LAB — CUP PACEART REMOTE DEVICE CHECK
Battery Remaining Longevity: 121 mo
Battery Remaining Percentage: 95.5 %
Battery Voltage: 2.99 V
Brady Statistic AP VP Percent: 1.7 %
Brady Statistic AP VS Percent: 98 %
Brady Statistic AS VP Percent: 0 %
Brady Statistic AS VS Percent: 1 %
Brady Statistic RA Percent Paced: 99 %
Brady Statistic RV Percent Paced: 1.7 %
Date Time Interrogation Session: 20190430100036
Implantable Lead Implant Date: 20171019
Implantable Lead Implant Date: 20171019
Implantable Lead Location: 753859
Implantable Lead Location: 753860
Implantable Pulse Generator Implant Date: 20171019
Lead Channel Impedance Value: 440 Ohm
Lead Channel Impedance Value: 940 Ohm
Lead Channel Pacing Threshold Amplitude: 0.5 V
Lead Channel Pacing Threshold Amplitude: 1 V
Lead Channel Pacing Threshold Pulse Width: 0.4 ms
Lead Channel Pacing Threshold Pulse Width: 0.4 ms
Lead Channel Sensing Intrinsic Amplitude: 12 mV
Lead Channel Sensing Intrinsic Amplitude: 3 mV
Lead Channel Setting Pacing Amplitude: 2 V
Lead Channel Setting Pacing Amplitude: 2.5 V
Lead Channel Setting Pacing Pulse Width: 0.4 ms
Lead Channel Setting Sensing Sensitivity: 2 mV
Pulse Gen Model: 2272
Pulse Gen Serial Number: 3180458

## 2018-03-15 ENCOUNTER — Ambulatory Visit
Admission: RE | Admit: 2018-03-15 | Discharge: 2018-03-15 | Disposition: A | Discharge status: Home / Self Care | Payer: Medicare Other | Source: Ambulatory Visit | Attending: Orthopedic Surgery | Admitting: Orthopedic Surgery

## 2018-03-15 DIAGNOSIS — M48061 Spinal stenosis, lumbar region without neurogenic claudication: Secondary | ICD-10-CM

## 2018-03-15 MED ORDER — DIAZEPAM 5 MG PO TABS
5.0000 mg | ORAL_TABLET | Freq: Once | ORAL | Status: AC
  Administered 2018-03-15: 5 mg via ORAL

## 2018-03-15 MED ORDER — IOPAMIDOL (ISOVUE-M 200) INJECTION 41%
15.0000 mL | Freq: Once | INTRAMUSCULAR | Status: AC
  Administered 2018-03-15: 15 mL via INTRATHECAL

## 2018-03-15 NOTE — Discharge Instructions (Signed)

## 2018-03-24 ENCOUNTER — Telehealth: Payer: Self-pay | Admitting: Internal Medicine

## 2018-03-24 NOTE — Telephone Encounter (Signed)
Received records from [facility] on 03/24/18, Appt 06/12/18 @ 3:00PM. [place]

## 2018-03-28 ENCOUNTER — Telehealth: Payer: Self-pay | Admitting: Cardiology

## 2018-03-28 NOTE — Telephone Encounter (Signed)
Pt daughter in law called to confirm if pt could have a MRI. After consulting with the Device Tech RN I informed pt daughter that according to our records pt device is MRI safe. But, she can call tech support to get the final ok. Pt daughter in law verbalized understanding.
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
## 2018-04-01 ENCOUNTER — Other Ambulatory Visit: Payer: Self-pay | Admitting: Orthopedic Surgery

## 2018-04-01 DIAGNOSIS — N2889 Other specified disorders of kidney and ureter: Secondary | ICD-10-CM

## 2018-04-01 DIAGNOSIS — M25551 Pain in right hip: Secondary | ICD-10-CM

## 2018-04-03 ENCOUNTER — Ambulatory Visit: Payer: Medicare Other | Admitting: Neurology

## 2018-04-03 ENCOUNTER — Encounter

## 2018-04-06 ENCOUNTER — Other Ambulatory Visit: Payer: Self-pay | Admitting: Orthopedic Surgery

## 2018-04-06 DIAGNOSIS — M25552 Pain in left hip: Secondary | ICD-10-CM

## 2018-04-12 DIAGNOSIS — M25561 Pain in right knee: Secondary | ICD-10-CM | POA: Insufficient documentation

## 2018-04-14 ENCOUNTER — Other Ambulatory Visit: Payer: Self-pay | Admitting: Internal Medicine

## 2018-04-24 ENCOUNTER — Ambulatory Visit: Payer: Medicare Other | Admitting: Internal Medicine

## 2018-05-23 ENCOUNTER — Ambulatory Visit (INDEPENDENT_AMBULATORY_CARE_PROVIDER_SITE_OTHER): Payer: Medicare Other | Admitting: *Deleted

## 2018-05-23 ENCOUNTER — Telehealth: Payer: Self-pay

## 2018-05-23 DIAGNOSIS — I495 Sick sinus syndrome: Secondary | ICD-10-CM

## 2018-05-23 NOTE — Telephone Encounter (Signed)
LMOVM reminding pt to send remote transmission.   

## 2018-05-24 ENCOUNTER — Encounter: Payer: Self-pay | Admitting: Cardiology

## 2018-05-25 ENCOUNTER — Encounter: Payer: Self-pay | Admitting: Cardiology

## 2018-05-25 NOTE — Progress Notes (Signed)
Remote pacemaker transmission.   

## 2018-06-12 ENCOUNTER — Ambulatory Visit: Payer: Medicare Other | Admitting: Internal Medicine

## 2018-06-12 ENCOUNTER — Encounter: Payer: Self-pay | Admitting: Internal Medicine

## 2018-06-12 ENCOUNTER — Encounter

## 2018-06-12 VITALS — BP 158/64 | HR 60 | Ht 60.0 in | Wt 143.4 lb

## 2018-06-12 DIAGNOSIS — I10 Essential (primary) hypertension: Secondary | ICD-10-CM | POA: Diagnosis not present

## 2018-06-12 DIAGNOSIS — I480 Paroxysmal atrial fibrillation: Secondary | ICD-10-CM

## 2018-06-12 DIAGNOSIS — N183 Chronic kidney disease, stage 3 unspecified: Secondary | ICD-10-CM

## 2018-06-12 DIAGNOSIS — Z950 Presence of cardiac pacemaker: Secondary | ICD-10-CM

## 2018-06-12 NOTE — Progress Notes (Signed)
OFFICE NOTE  Chief Complaint:  Weight gain  Primary Care Physician: Leeroy Cha, MD  HPI:  [name] is a pleasant 77 year old female with history of coronary disease and CABG x 3 vessels in 2013, LIMA to LAD, SVG to OM and SVG to right coronary. She underwent an echocardiogram which demonstrates normal EF of 55% to 65%. However, there is stage 1 diastolic dysfunction and evidence for elevated mean left atrial filling pressure. There were mildly thickened calcified aortic valve leaflets but no stenosis and mild tricuspid regurgitation. At the time, I started her on Lasix 20 mg daily. A BMP was obtained that day which indicated mild elevation at 75.5, creatinine was 1.16. She has been taking the Lasix with improvement in her lower extremity swelling although shortness of breath seems to be about the same. Again, she notes it is only with marked exertion, not necessarily with normal activities. However, she does have ongoing fatigue and easy fatigability. This may be due to just poor exercise tolerance. She also reported that recently you started her on a new SLGT2 diabetic medicine and she said that she took several doses of that, which I believe was Invokana, and she became markedly dizzy with that. She has since stopped that medication. Again, overall the patient is fairly stable with a normal ejection fraction. She recently had bypass and is not having any anginal symptoms and therefore I do not suspect that this is an issue related to her bypass grafts.   I saw [name] today in the office for an acute visit regarding what is described as abdominal pain. She reports a numbness and pain sensation over the right upper quadrant. She's also noted some firmness and distention of her abdomen. This is recently presented to a GI doctor in [place] who thought she might have a gastric ulcer. He performed endoscopy which was a reportedly unrevealing. He also recommended she is take  MiraLAX for constipation. She says that recently she's been constipated but also has been having some small amount of loose stool. She did not want to take the MiraLAX given the loose stool. She also reports some of the discomfort extends across the mid epigastrium and into the lower chest. It is entirely atypical for cardiac chest pain. She reports that she had a colonoscopy in the past by Dr. Henrene Pastor about 5 years ago and a repeat colonoscopy was recommended around 2018. She also tells me that she recently had a right upper quadrant ultrasound performed at [facility] after presenting with abdominal pain which was negative for acute biliary disease.  [name] returns today for follow-up. Overall she seems to be doing well from a cardiac standpoint. I understand she is switched primary care providers to [facility] primary care, however she is unclear of her primary care provider. She does have an upcoming appointment to see Jackelyn Knife neurology. Recently she's been having trouble with forgetfulness and it seems to be getting worse. She tells me she lost her cell phone at home and has not been able to locate it. She denies any chest pain or shortness of breath and in fact her flank back and abdominal pain which was evaluated extensively has now gone away.  I had the pleasure of seeing [name] back today in follow-up. It's been 6 months and she'll last appointment. Her main concern today is leg weakness. She reports she has some back pain and associated weakness in her legs that give out from time  to time. She recently had memory testing indicating possible dementia as well as likely depression. It was recommended that she go on therapy however she declined medications. She denies any chest pain or worsening shortness of breath.  07/01/2016  [name] returns today for follow-up. She was recently hospitalized for acute diastolic heart failure. She was treated with diuretics and  adjustments are made to her blood pressure medications. She had a repeat echocardiogram which was essentially stable showed normal LV EF and diastolic dysfunction. After discharge she underwent nuclear stress testing which was negative for ischemia. She saw Ignacia Bayley, NP, who made some changes to her blood pressure medications including placing her on clonidine. Since taking this medicine she has had some of the typical symptoms of fatigue and dry mouth as well as constipation. She was alert initially taking clonidine 2 mg twice daily. She then decreased it to one half tablet or 1 mg twice daily but blood pressure remains high. I checked her blood pressure manually today 160/60. She reports fatigue and low energy.  09/22/2016  [name] returns today for hospital follow-up. 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 She had a St. Jude surety MRI safe pacemaker placed. She was also started on amiodarone and underwent cardioversion. She seems to be maintaining sinus rhythm. She reports improvement in her energy. She recently saw Ignacia Bayley, NP, in follow-up who reported she was doing well although noted her blood pressure was more elevated. She was placed on doxazosin 4 mg daily and had initial improvement in blood pressure however blood pressure has since gone up. Today was 186/62. Unfortunately, she's been intolerant of a number of blood pressure medications in the past.  12/23/2016  [name] was seen in follow-up today. She saw Dr. candidates in January who noted that she reported being shaky on amiodarone and he decreased the dose down to 100 mg daily. She remains in an AV paced rhythm but there has been recurrent underlying atrial flutter. She is also had some fatigue. The pressure is elevated today 156/70. I reviewed home monitoring indicates blood pressures continue to be elevated. She seems to be okay with these numbers but she  is at increased risk for events. Unfortunate she's had intolerance to number of medications.  04/22/2017  [name] returns today for follow-up. She recently saw Almyra Deforest, PA-C, who noted that she had had some weight gain and lower extremity edema. He discontinued her chlorthalidone and put her on low-dose Lasix. She's had improvement in her swallowing with this and some weight loss. She then followed up with Dr. Moshe Cipro at [facility]. Her creatinine was noted to be 2.7 and she is undergoing a renal ultrasound and further workup for this. She was also noted to be anemic with a low iron saturation. She's been receiving some IV iron. She seems to be maintaining sinus rhythm on low-dose amiodarone. Recently she saw her endocrinologist who had increased her Synthroid. Her last TSH was between 7 and 8. She is due for repeat check of that. She has done well with the placement of a pacemaker in the fall. She has remote checks to follow that.  08/25/2017  [name] was seen today in follow-up.  Recently she had about 12 pound weight gain and saw her nephrologist.  She was started back on Lasix and was noted to have an elevated creatinine now back up to 2.14.  In addition her blood pressure was running low, specifically  her diastolic blood pressure and her amlodipine was decreased to 5 mg daily.  She seems to be maintaining sinus rhythm and is on low-dose amiodarone.  She is in a paced rhythm today and recent remote check showed no real evidence of atrial fibrillation.  She was on Xarelto however stopped that on her own recently because of fatigue and weakness.  She was also noted to be anemic and has been started on iron.  This begs a question of whether she may be having microscopic bleeding or whether her anemia was related to chronic kidney disease.  Nevertheless, she has an appropriately elevated chads vascular score and should be anticoagulated on Xarelto.  10/26/2017  [name] returns today  for follow-up.  She again is gained about 5 or 6 pounds.  Apparently she had seen her nephrologist and was started back on Lasix however her creatinine is rising.  I repeated labs on October 10, 2017 which showed a creatinine had increased up to 2.24, previously 1.9 and 2.45.  Her pro-BNP was elevated at 1437.  Since that time she has had some worsening shortness of breath, lower extremity swelling and increasing abdominal girth.  Blood pressure is quite elevated today 190/70, however she says is been lower at home.  06/12/2018  [name] is again seen today in follow-up.  She has had some increased abdominal girth but no significant swelling or edema.  Remote pacemaker checks have been performed which showed normal battery life and adequate pacer function.  She is noted today to be in an atrial paced rhythm with prolonged AV conduction at 60.  She denies any chest pain or worsening shortness of breath. She was recently seen by Dr. Moshe Cipro at [facility].  It is felt that her creatinine has been somewhat stable.  PMHx:  Past Medical History:  Diagnosis Date  . Arthritis    "in my hands"  . Carotid artery disease ([place])    a. 40-59% bilaterally 10/2015.  Marland Kitchen Chronic diastolic CHF (congestive heart failure) ([place])    a. 05/2016 Echo: EF 60-65%, no rwma, Gr1 DD, Ao sclerosis w/o stenosis, triv MR;  b. 07/2016 TEE: EF 55-60%, no rwma, mild MR.  . CKD (chronic kidney disease), stage III ([place])   . Coronary artery disease    a. 02/2007 Persantine MV: low risk;  b. 11/2011 CABG x 3 (LIMA->LAD, VG->OM, VG->RCA);  c. 05/2016 MV: EF >65%, no isch/infarct, horiz ST dep in I, II, V5-V6.  Marland Kitchen Depression   . Diabetes mellitus   . Diverticulosis   . Esophageal stricture   . GERD (gastroesophageal reflux disease)   . Hemorrhoids   . Hiatal hernia   . Hyperkalemia    a. ARB stopped due to this.  . Hyperlipidemia   . Hypertensive heart disease   . Hypothyroidism   . Mild cognitive impairment    a. seen  by neurology.  Marland Kitchen PAF (paroxysmal atrial fibrillation) ([facility])    a. post-op CABG.  . Pain    RIGHT KNEE PAIN - TORN RIGHT MEDIAL MENISCUS  . Paroxysmal atrial flutter ([place])    a. 07/2016 s/p TEE & DCCV;  b. 07/2016 Recurrent PAFlutter req initiation of amio & PPM in setting of tachy-brady;  c. CHA2DS2VASc = 7-->Xarelto 15 mg QD.  Marland Kitchen PONV (postoperative nausea and vomiting)   . S/P CABG (coronary artery bypass graft), 12/04/11 12/07/2011   LIMA to LAD, SVG to OM, SVG to RCA  . Sinus bradycardia    a. not on  BB due to this.  . Tachy-brady syndrome ([place])    a. 07/2016 Jxnl brady following DCCV, recurrent Aflutter-->amio + SJM 2272 Assurity MRI DC PPM (ser # [id number]).    Past Surgical History:  Procedure Laterality Date  . ABDOMINAL HYSTERECTOMY  1980's  . BACK SURGERY  2006   "cyst growing near my spine"  . CARDIAC CATHETERIZATION  12/02/2011   mild LV dysfunction with mod hypocontractility of mid-distal anterolateral wall; CAD w/ostial tapering of L [place] with 50% diffuse ostial narrowing of LAD, 99% eccentric focal prox LAD stenosis followed by 70% prox LAD stenosis after 1st diag, 20% mid LAD narrowing; 80% ostial-to-prox L Cfx stenosis & 40-50% irregularity of RCA (Dr. Corky Downs)  . CARDIOVERSION N/A 08/11/2016   Procedure: CARDIOVERSION;  Surgeon: Lelon Perla, MD;  Location: [facility];  Service: Cardiovascular;  Laterality: N/A;  . CATARACT EXTRACTION W/ INTRAOCULAR LENS  IMPLANT, BILATERAL  ~ 2010  . [place]  . CORONARY ARTERY BYPASS GRAFT  12/04/2011   Procedure: CORONARY ARTERY BYPASS GRAFTING (CABG);  Surgeon: Tharon Aquas Adelene Idler, MD;  Location: [facility];  Service: Open Heart Surgery;  Laterality: N/A;  CABG x three,  using left internal mammary artery, and right leg greater saphenous vein harvested endoscopically  . DILATION AND CURETTAGE OF UTERUS     "a couple times"  . EP IMPLANTABLE DEVICE N/A 08/12/2016   Procedure: Pacemaker Implant;  Surgeon: Will Meredith Leeds, MD;  Location: [facility];  Service: Cardiovascular;  Laterality: N/A;  . FRACTURE SURGERY     "put pins both side right ankle"  . JOINT REPLACEMENT    . KNEE ARTHROSCOPY WITH MEDIAL MENISECTOMY Right 07/02/2014   Procedure: RIGHT KNEE ARTHROSCOPY WITH PARTIAL MEDIAL MENISTECTOMY, ABRASION CONDROPLASTYU OF PATELLA,ABRASION CONDROPLASTY OF MEDIAL FEMEROL CONDYL, MICROFRACTURE OF MEDIAL FEMEROL CONDYL;  Surgeon: Tobi Bastos, MD;  Location: [facility];  Service: Orthopedics;  Laterality: Right;  . LEFT HEART CATHETERIZATION WITH CORONARY ANGIOGRAM N/A 12/02/2011   Procedure: LEFT HEART CATHETERIZATION WITH CORONARY ANGIOGRAM;  Surgeon: Troy Sine, MD;  Location: [facility] C[facility];  Service: Cardiovascular;  Laterality: N/A;  Coronary angiogram, possible PCI  . TEE WITHOUT CARDIOVERSION N/A 08/11/2016   Procedure: TRANSESOPHAGEAL ECHOCARDIOGRAM (TEE);  Surgeon: Lelon Perla, MD;  Location: [facility];  Service: Cardiovascular;  Laterality: N/A;  . TONSILLECTOMY  1949  . TOTAL KNEE ARTHROPLASTY  ~ 2006   left  . TRANSTHORACIC ECHOCARDIOGRAM  02/19/2013   EF 41-32%, grade 1 diastolic dysfunction; mildly thickend/calcified AV leaflets; mildly calcidied MV annulus; mild TR    FAMHx:  Family History  Problem Relation Age of Onset  . Diabetes Mother   . CVA Mother   . Hypertension Mother   . Heart disease Father   . Hyperlipidemia Father   . Breast cancer Sister        x 3  . Heart disease Brother        x5; one with MI  . Heart disease Sister        x3  . Diabetes Sister        x3  . Lung cancer Sister   . Breast cancer Sister        x2  . Colon cancer Neg Hx     SOCHx:   reports that she has never smoked. She has never used smokeless tobacco. She reports that she drinks alcohol. She reports that she does not use drugs.  ALLERGIES:  Allergies  Allergen Reactions  . Clonidine Derivatives Other (See Comments)    Bradycardia and fatigue   . Sulfa Antibiotics Other  (See Comments)    Unknown  . Crestor [Rosuvastatin Calcium] Other (See Comments)    Other reaction(s): tired and weak  . Epinephrine Other (See Comments)    Abnormal feeling. Dental exam/injection of local w/ epi.  Marland Kitchen Hydralazine Other (See Comments)    Nausea/gi upset   . Losartan Other (See Comments)    Hyperkalemia   . Other Other (See Comments)    MANGO'S - WHELPS ALL OVER    ROS: Pertinent items noted in HPI and remainder of comprehensive ROS otherwise negative.  HOME MEDS: Current Outpatient Medications  Medication Sig Dispense Refill  . carvedilol (COREG) 6.25 MG tablet TAKE 1 TABLET BY MOUTH TWICE DAILY 60 tablet 5  . Cyanocobalamin (VITAMIN B-12 PO) Take 1 tablet by mouth daily.    Marland Kitchen esomeprazole (NEXIUM) 20 MG capsule TAKE ONE CAPSULE BY MOUTH ONCE DAILY AT  12  NOON 90 capsule 1  . ferrous sulfate 325 (65 FE) MG EC tablet Take 325 mg by mouth daily with breakfast.    . gabapentin (NEURONTIN) 300 MG capsule Take 300-600 mg by mouth See admin instructions. Take 300 mg by mouth in the morning and take 600 mg by mouth at bedtime    . levothyroxine (SYNTHROID) 88 MCG tablet Take 1 tablet by mouth daily.    . meclizine (ANTIVERT) 25 MG tablet Take 25 mg by mouth daily as needed for dizziness. For dizziness    . Multiple Vitamins-Minerals (PRESERVISION AREDS 2 PO) Take 1 tablet by mouth daily.    Vladimir Faster Glycol-Propyl Glycol (SYSTANE OP) Place 1 drop into both eyes 2 (two) times daily.     Marland Kitchen torsemide (DEMADEX) 20 MG tablet Take 40 mg by mouth every morning.    Alveda Reasons 15 MG TABS tablet TAKE 1 TABLET BY MOUTH ONCE DAILY WITH SUPPER 90 tablet 1   No current facility-administered medications for this visit.     LABS/IMAGING: No results found for this or any previous visit (from the past 48 hour(s)). No results found.  VITALS: BP (!) 158/64 (BP Location: Right Arm, Patient Position: Sitting, Cuff Size: Normal)   Pulse 60   Ht 5' (1.524 m)   Wt 143 lb 6.4 oz (65 kg)    BMI 28.01 kg/m   EXAM: General appearance: alert and no distress Neck: no carotid bruit and no JVD Lungs: clear to auscultation bilaterally Heart: regular rate and rhythm, S1, S2 normal, no murmur, click, rub or gallop Abdomen: soft, non-tender; bowel sounds normal; no masses,  no organomegaly Extremities: edema Trace to 1+ bilateral lower extremity edema Pulses: 2+ and symmetric Skin: Skin color, texture, turgor normal. No rashes or lesions Neurologic: Grossly normal  EKG: Atrial paced rhythm with prolonged AV conduction at 60-personally reviewed  ASSESSMENT: 1. Coronary artery disease status post CABG x 3 vessels in 2013 2. Paroxysmal atrial flutter-CHADSVASC score of 5 on Xarelto 3. Tachybradycardia syndrome status post St. Jude permanent pacemaker (07/2016) 4. Diabetes type 2 - now on insulin 5. Hypertension 6. Dyslipidemia 7. Peripheral neuropathy - secondary to diabetes 8. Progressive memory loss 9. Low back pain and leg weakness  10. CKD 3  PLAN: 1.   [name] has had increasing abdominal girth but no significant peripheral edema and weight has been fairly stable.  Her pacemaker has been interrogated working properly.  She has significant neuropathy and progressive memory loss.  Her kidney disease is stable and followed by Dr. Moshe Cipro.  No changes were made to her medications today.  Plan follow-up with me in 6 months or sooner as necessary.  [name], MD, [facility], [place] Director of the Advanced Lipid Disorders &  Cardiovascular Risk Reduction Clinic Attending Cardiologist  Direct Dial: [phone]  Fax: [phone]  Website:  [name] 06/12/2018, 3:35 PM

## 2018-06-12 NOTE — Patient Instructions (Signed)
Your physician wants you to follow-up in: 6 months with Dr. Hilty. You will receive a reminder letter in the mail two months in advance. If you don't receive a letter, please call our office to schedule the follow-up appointment.    

## 2018-06-13 ENCOUNTER — Encounter: Payer: Self-pay | Admitting: Internal Medicine

## 2018-06-16 DIAGNOSIS — S42209A Unspecified fracture of upper end of unspecified humerus, initial encounter for closed fracture: Secondary | ICD-10-CM | POA: Insufficient documentation

## 2018-06-16 DIAGNOSIS — M25512 Pain in left shoulder: Secondary | ICD-10-CM | POA: Insufficient documentation

## 2018-06-24 LAB — CUP PACEART REMOTE DEVICE CHECK
Battery Remaining Longevity: 122 mo
Battery Remaining Percentage: 95.5 %
Battery Voltage: 3.01 V
Brady Statistic AP VP Percent: 1 %
Brady Statistic AP VS Percent: 99 %
Brady Statistic AS VP Percent: 1 %
Brady Statistic AS VS Percent: 1 %
Brady Statistic RA Percent Paced: 99 %
Brady Statistic RV Percent Paced: 1 %
Date Time Interrogation Session: 20190801174957
Implantable Lead Implant Date: 20171019
Implantable Lead Implant Date: 20171019
Implantable Lead Location: 753859
Implantable Lead Location: 753860
Implantable Pulse Generator Implant Date: 20171019
Lead Channel Impedance Value: 430 Ohm
Lead Channel Impedance Value: 910 Ohm
Lead Channel Pacing Threshold Amplitude: 0.5 V
Lead Channel Pacing Threshold Amplitude: 1 V
Lead Channel Pacing Threshold Pulse Width: 0.4 ms
Lead Channel Pacing Threshold Pulse Width: 0.4 ms
Lead Channel Sensing Intrinsic Amplitude: 12 mV
Lead Channel Sensing Intrinsic Amplitude: 5 mV
Lead Channel Setting Pacing Amplitude: 2 V
Lead Channel Setting Pacing Amplitude: 2.5 V
Lead Channel Setting Pacing Pulse Width: 0.4 ms
Lead Channel Setting Sensing Sensitivity: 2 mV
Pulse Gen Model: 2272
Pulse Gen Serial Number: 3180458

## 2018-08-12 ENCOUNTER — Other Ambulatory Visit: Payer: Self-pay | Admitting: Internal Medicine

## 2018-08-14 ENCOUNTER — Telehealth: Payer: Self-pay | Admitting: Internal Medicine

## 2018-08-14 NOTE — Telephone Encounter (Signed)
Spoke to daughter- appointment moved to 08/15/18 with an extender

## 2018-08-14 NOTE — Progress Notes (Signed)
Cardiology Office Note   Date:  08/15/2018   ID:  [name], DOB [birth year], MRN [id number]  PCP:  Leeroy Cha, MD  Cardiologist: Dr. Debara Pickett  Chief Complaint  Patient presents with  . Chest Pain  . Shortness of Breath     History of Present Illness: [name] is a 77 y.o. female who presents for ongoing assessment and management of CAD with hx of CABG in 2013, LIMA to LAD, SVG to OM, SVG to RCA, chronic diastolic congestive CHF, PPM in situ for ([employer]) in the setting of progressive symptomatic   She is on Xarelto 15 mg  for anticoagulation. She is also being seen by nephrology for CKD Stage III, and is on lasix. She was last seen by Dr. Debara Pickett on 06/12/2018 and had complaints of increased abdominal girth, but no significant swelling or edema. No medication changes were made. Her daughter called our office on 08/14/2018, to report that the patient was complaining of chest pain with exertion.    She is accompanied by her daughter who is an Therapist, sports at [facility] [employer] hospital. The patient describes tight feeling in her upper chest with radiation into the neck with difficulty taking deep breaths  This occurs with exertion, carrying laundry etc. She has a "liitle bit" of pain when she walked from waiting area to the exam room. She down plays the pain, but her daughter has become concerned about the symptoms.   Past Medical History:  Diagnosis Date  . Arthritis    "in my hands"  . Carotid artery disease ([place])    a. 40-59% bilaterally 10/2015.  Marland Kitchen Chronic diastolic CHF (congestive heart failure) ([place])    a. 05/2016 Echo: EF 60-65%, no rwma, Gr1 DD, Ao sclerosis w/o stenosis, triv MR;  b. 07/2016 TEE: EF 55-60%, no rwma, mild MR.  . CKD (chronic kidney disease), stage III ([place])   . Coronary artery disease    a. 02/2007 Persantine MV: low risk;  b. 11/2011 CABG x 3 (LIMA->LAD, VG->OM, VG->RCA);  c. 05/2016 MV: EF >65%, no isch/infarct, horiz ST dep in I, II, V5-V6.  Marland Kitchen Depression   .  Diabetes mellitus   . Diverticulosis   . Esophageal stricture   . GERD (gastroesophageal reflux disease)   . Hemorrhoids   . Hiatal hernia   . Hyperkalemia    a. ARB stopped due to this.  . Hyperlipidemia   . Hypertensive heart disease   . Hypothyroidism   . Mild cognitive impairment    a. seen by neurology.  Marland Kitchen PAF (paroxysmal atrial fibrillation) ([facility])    a. post-op CABG.  . Pain    RIGHT KNEE PAIN - TORN RIGHT MEDIAL MENISCUS  . Paroxysmal atrial flutter ([place])    a. 07/2016 s/p TEE & DCCV;  b. 07/2016 Recurrent PAFlutter req initiation of amio & PPM in setting of tachy-brady;  c. CHA2DS2VASc = 7-->Xarelto 15 mg QD.  Marland Kitchen PONV (postoperative nausea and vomiting)   . S/P CABG (coronary artery bypass graft), 12/04/11 12/07/2011   LIMA to LAD, SVG to OM, SVG to RCA  . Sinus bradycardia    a. not on BB due to this.  . Tachy-brady syndrome ([place])    a. 07/2016 Jxnl brady following DCCV, recurrent Aflutter-->amio + SJM 2272 Assurity MRI DC PPM (ser # [id number]).    Past Surgical History:  Procedure Laterality Date  . ABDOMINAL HYSTERECTOMY  1980's  . BACK SURGERY  2006   "cyst growing near my spine"  .  CARDIAC CATHETERIZATION  12/02/2011   mild LV dysfunction with mod hypocontractility of mid-distal anterolateral wall; CAD w/ostial tapering of L Main with 50% diffuse ostial narrowing of LAD, 99% eccentric focal prox LAD stenosis followed by 70% prox LAD stenosis after 1st diag, 20% mid LAD narrowing; 80% ostial-to-prox L Cfx stenosis & 40-50% irregularity of RCA (Dr. Corky Downs)  . CARDIOVERSION N/A 08/11/2016   Procedure: CARDIOVERSION;  Surgeon: Lelon Perla, MD;  Location: [facility];  Service: Cardiovascular;  Laterality: N/A;  . CATARACT EXTRACTION W/ INTRAOCULAR LENS  IMPLANT, BILATERAL  ~ 2010  . [place]  . CORONARY ARTERY BYPASS GRAFT  12/04/2011   Procedure: CORONARY ARTERY BYPASS GRAFTING (CABG);  Surgeon: Tharon Aquas Adelene Idler, MD;  Location: [facility];  Service: Open  Heart Surgery;  Laterality: N/A;  CABG x three,  using left internal mammary artery, and right leg greater saphenous vein harvested endoscopically  . DILATION AND CURETTAGE OF UTERUS     "a couple times"  . EP IMPLANTABLE DEVICE N/A 08/12/2016   Procedure: Pacemaker Implant;  Surgeon: Will Meredith Leeds, MD;  Location: [facility];  Service: Cardiovascular;  Laterality: N/A;  . FRACTURE SURGERY     "put pins both side right ankle"  . JOINT REPLACEMENT    . KNEE ARTHROSCOPY WITH MEDIAL MENISECTOMY Right 07/02/2014   Procedure: RIGHT KNEE ARTHROSCOPY WITH PARTIAL MEDIAL MENISTECTOMY, ABRASION CONDROPLASTYU OF PATELLA,ABRASION CONDROPLASTY OF MEDIAL FEMEROL CONDYL, MICROFRACTURE OF MEDIAL FEMEROL CONDYL;  Surgeon: Tobi Bastos, MD;  Location: [facility];  Service: Orthopedics;  Laterality: Right;  . LEFT HEART CATHETERIZATION WITH CORONARY ANGIOGRAM N/A 12/02/2011   Procedure: LEFT HEART CATHETERIZATION WITH CORONARY ANGIOGRAM;  Surgeon: Troy Sine, MD;  Location: [facility];  Service: Cardiovascular;  Laterality: N/A;  Coronary angiogram, possible PCI  . TEE WITHOUT CARDIOVERSION N/A 08/11/2016   Procedure: TRANSESOPHAGEAL ECHOCARDIOGRAM (TEE);  Surgeon: Lelon Perla, MD;  Location: [facility];  Service: Cardiovascular;  Laterality: N/A;  . TONSILLECTOMY  1949  . TOTAL KNEE ARTHROPLASTY  ~ 2006   left  . TRANSTHORACIC ECHOCARDIOGRAM  02/19/2013   EF 93-79%, grade 1 diastolic dysfunction; mildly thickend/calcified AV leaflets; mildly calcidied MV annulus; mild TR     Current Outpatient Medications  Medication Sig Dispense Refill  . carvedilol (COREG) 6.25 MG tablet TAKE 1 TABLET BY MOUTH TWICE DAILY 60 tablet 5  . Cyanocobalamin (VITAMIN B-12 PO) Take 1 tablet by mouth daily.    Marland Kitchen esomeprazole (NEXIUM) 20 MG capsule TAKE 1 CAPSULE BY MOUTH ONCE DAILY AT  12  NOON 90 capsule 1  . ferrous sulfate 325 (65 FE) MG EC tablet Take 325 mg by mouth daily with breakfast.    . gabapentin  (NEURONTIN) 300 MG capsule Take 300-600 mg by mouth See admin instructions. Take 300 mg by mouth in the morning and take 600 mg by mouth at bedtime    . levothyroxine (SYNTHROID) 88 MCG tablet Take 1 tablet by mouth daily.    . meclizine (ANTIVERT) 25 MG tablet Take 25 mg by mouth daily as needed for dizziness. For dizziness    . Multiple Vitamins-Minerals (PRESERVISION AREDS 2 PO) Take 1 tablet by mouth daily.    Vladimir Faster Glycol-Propyl Glycol (SYSTANE OP) Place 1 drop into both eyes 2 (two) times daily.     Marland Kitchen torsemide (DEMADEX) 20 MG tablet Take 40 mg by mouth every morning.    Alveda Reasons 15 MG TABS tablet TAKE 1 TABLET BY  MOUTH ONCE DAILY WITH SUPPER 90 tablet 1   No current facility-administered medications for this visit.     Allergies:   Clonidine derivatives; Sulfa antibiotics; Crestor [rosuvastatin calcium]; Epinephrine; Hydralazine; Losartan; and Other    Social History:  The patient  reports that she has never smoked. She has never used smokeless tobacco. She reports that she drinks alcohol. She reports that she does not use drugs.   Family History:  The patient's family history includes Breast cancer in her sister and sister; CVA in her mother; Diabetes in her mother and sister; Heart disease in her brother, father, and sister; Hyperlipidemia in her father; Hypertension in her mother; Lung cancer in her sister.    ROS: All other systems are reviewed and negative. Unless otherwise mentioned in H&P    PHYSICAL EXAM: VS:  BP 140/78   Pulse 62   Ht 5' (1.524 m)   Wt 142 lb 3.2 oz (64.5 kg)   BMI 27.77 kg/m  , BMI Body mass index is 27.77 kg/m. GEN: Well nourished, well developed, in no acute distress HEENT: normal Neck: no JVD, carotid bruits, or masses Cardiac: RRR; no murmurs, rubs, or gallops,no edema  Respiratory:  Clear to auscultation bilaterally, normal work of breathing GI: soft, nontender, nondistended, + BS MS: no deformity or atrophy Skin: warm and dry, no  rash Neuro:  Strength and sensation are intact Psych: euthymic mood, full affect, anxious    EKG:  Paced rhythm with T-wave inversion noted in the lateral leads, more pronounced compared to EKG in 06-16-18.  (I asked Dr.Christopher to review and compare EKG with me).   Recent Labs: 10/10/2017: Hemoglobin 10.5; NT-Pro BNP 1,437; Platelets 223 10/31/2017: ALT 10; BUN 64; Creatinine, Ser 2.77; Potassium 5.4; Sodium 138 02/06/2018: TSH 9.240    Lipid Panel    Component Value Date/Time   CHOL 330 (H) 08/08/2016 2130   TRIG 313 (H) 08/08/2016 2130   HDL 42 08/08/2016 2130   CHOLHDL 7.9 08/08/2016 2130   VLDL 63 (H) 08/08/2016 2130   LDLCALC 225 (H) 08/08/2016 2130      Wt Readings from Last 3 Encounters:  08/15/18 142 lb 3.2 oz (64.5 kg)  06/12/18 143 lb 6.4 oz (65 kg)  02/06/18 145 lb (65.8 kg)      Other studies Reviewed: Echocardiogram August 12, 2018.  Left ventricle: Systolic function was normal. The estimated   ejection fraction was in the range of 55% to 60%. Wall motion was   normal; there were no regional wall motion abnormalities. - Aortic valve: No evidence of vegetation. - Mitral valve: No evidence of vegetation. There was mild   regurgitation. - Left atrium: The atrium was mildly dilated. No evidence of   thrombus in the atrial cavity or appendage. - Right atrium: No evidence of thrombus in the atrial cavity or   appendage. - Atrial septum: No defect or patent foramen ovale was identified. - Tricuspid valve: No evidence of vegetation. - Pulmonic valve: No evidence of vegetation.  Impressions: - Normal LV systolic function; mild LAE; no LAA thrombus; calcified   subvalvular tricuspid apparatus; mild MR.  NM Stress Test 06/03/2016  Nuclear stress EF: 73%.  Horizontal ST segment depression ST segment depression of 1 mm was noted during stress in the I, II, V5 and V6 leads.  The study is normal.  This is a low risk study. The left ventricular ejection  fraction is hyperdynamic (>65%).  ASSESSMENT AND PLAN:  1. CAD: Hx of inferior MI,  and CABG. She has been having more progressive anginal pain, which appears stable and related to exertion. Describes it as tightness and some difficulty taking deep breaths, She minimizes her symptoms. I have scheduled her for a Lexiscan Myoview for evaluation of new area's of ischemia, and an echo for changes in LV fx in the setting of dyspnea. EKG is concerning. I have ordered NTG for her to use prn for chest pain. She is advised that if her chest pain worsens or becomes more persistent she is advised to go to ER.  Daughter would like troponin to be drawn today with her labs. I will order this along with BMET.   2. PAF: Continues on Xarelto at renal dose at 15 mg. Heart rate is controlled.  3. Chronic Diastolic CHF:No evidence of fluid overload on this exam. Weight has been stable.  4. Anemia: Recent labs demonstrated Hgb of 11. 2.   Current medicines are reviewed at length with the patient today.    Labs/ tests ordered today include: BMET, CBC, Lexiscan Myoview and Echo [employer] [place] Pugh, ANP, AACC   08/15/2018 10:15 AM    [place] [address] [address] Office [phone] Fax [phone]

## 2018-08-14 NOTE — Telephone Encounter (Signed)
New Message:      Pt c/o of Chest Pain: STAT if CP now or developed within 24 hours  1. Are you having CP right now?   2. Are you experiencing any other symptoms (ex. SOB, nausea, vomiting, sweating)? sob  3. How long have you been experiencing CP? For about a week states the pt's daughter  4. Is your CP continuous or coming and going? Only on exertion  5. Have you taken Nitroglycerin? No ?   [phone] Daughter states if we would like to change the appt please call her first to see if she is available

## 2018-08-15 ENCOUNTER — Encounter: Payer: Self-pay | Admitting: Adult Health

## 2018-08-15 ENCOUNTER — Ambulatory Visit: Payer: Medicare Other | Admitting: Adult Health

## 2018-08-15 VITALS — BP 140/78 | HR 62 | Ht 60.0 in | Wt 142.2 lb

## 2018-08-15 DIAGNOSIS — I2510 Atherosclerotic heart disease of native coronary artery without angina pectoris: Secondary | ICD-10-CM

## 2018-08-15 DIAGNOSIS — R0602 Shortness of breath: Secondary | ICD-10-CM

## 2018-08-15 DIAGNOSIS — Z79899 Other long term (current) drug therapy: Secondary | ICD-10-CM

## 2018-08-15 DIAGNOSIS — R079 Chest pain, unspecified: Secondary | ICD-10-CM

## 2018-08-15 DIAGNOSIS — I5032 Chronic diastolic (congestive) heart failure: Secondary | ICD-10-CM

## 2018-08-15 MED ORDER — NITROGLYCERIN 0.4 MG SL SUBL: 0.4000 mg | SUBLINGUAL_TABLET | SUBLINGUAL | 3 refills | Status: DC | PRN

## 2018-08-15 NOTE — Patient Instructions (Signed)
Medication Instructions:  NO CHANGES- Your physician recommends that you continue on your current medications as directed. Please refer to the Current Medication list given to you today.  If you need a refill on your cardiac medications before your next appointment, please call your pharmacy.  Labwork: TROP,CBC AND BMET TODAY HERE IN OUR OFFICE AT [facility]  Take the provided lab slips with you to the lab for your blood draw.   If you have labs (blood work) drawn today and your tests are completely normal, you will receive your results only by: Marland Kitchen MyChart Message (if you have MyChart) OR . A paper copy in the mail If you have any lab test that is abnormal or we need to change your treatment, we will call you to review the results.  Testing/Procedures: Echocardiogram - Your physician has requested that you have an echocardiogram. Echocardiography is a painless test that uses sound waves to create images of your heart. It provides your doctor with information about the size and shape of your heart and how well your heart's chambers and valves are working. This procedure takes approximately one hour. There are no restrictions for this procedure. This will be performed at our [facility] location - [address].  Your physician has requested that you have an Exercise Myoview. A cardiac stress test is a cardiological test that measures the heart's ability to respond to external stress in a controlled clinical environment. The stress response is induced by exercise (exercise-treadmill). For further information please visit [email or web address]tn. If you have questions or concerns about your appointment, you can call the Nuclear Lab at [phone].   Follow-Up: You will need a follow up appointment in [address] [place].  You may see  DR HILTY or one of the following Advanced Practice Providers on your designated Care Team:   . Almyra Deforest, PA-C . Fabian Sharp, PA-C  At [facility], you  and your health needs are our priority.  As part of our continuing mission to provide you with exceptional heart care, we have created designated Provider Care Teams.  These Care Teams include your primary Cardiologist (physician) and Advanced Practice Providers (APPs -  Physician Assistants and Nurse Practitioners) who all work together to provide you with the care you need, when you need it.  Thank you for choosing [facility] at [facility]!!

## 2018-08-16 ENCOUNTER — Telehealth: Payer: Self-pay | Admitting: Cardiology

## 2018-08-16 LAB — CBC
Hematocrit: 33.1 % — ABNORMAL LOW (ref 34.0–46.6)
Hemoglobin: 11.1 g/dL (ref 11.1–15.9)
MCH: 30.8 pg (ref 26.6–33.0)
MCHC: 33.5 g/dL (ref 31.5–35.7)
MCV: 92 fL (ref 79–97)
Platelets: 275 10*3/uL (ref 150–450)
RBC: 3.6 x10E6/uL — ABNORMAL LOW (ref 3.77–5.28)
RDW: 13.5 % (ref 12.3–15.4)
WBC: 8.5 10*3/uL (ref 3.4–10.8)

## 2018-08-16 LAB — BASIC METABOLIC PANEL
BUN/Creatinine Ratio: 25 (ref 12–28)
BUN: 57 mg/dL — ABNORMAL HIGH (ref 8–27)
CO2: 25 mmol/L (ref 20–29)
Calcium: 9.5 mg/dL (ref 8.7–10.3)
Chloride: 97 mmol/L (ref 96–106)
Creatinine, Ser: 2.31 mg/dL — ABNORMAL HIGH (ref 0.57–1.00)
GFR calc Af Amer: 23 mL/min/{1.73_m2} — ABNORMAL LOW (ref >59)
GFR calc non Af Amer: 20 mL/min/{1.73_m2} — ABNORMAL LOW (ref >59)
Glucose: 149 mg/dL — ABNORMAL HIGH (ref 65–99)
Potassium: 4.8 mmol/L (ref 3.5–5.2)
Sodium: 141 mmol/L (ref 134–144)

## 2018-08-16 LAB — TROPONIN I: Troponin I: 0.1 ng/mL (ref 0.00–0.04)

## 2018-08-16 NOTE — Telephone Encounter (Signed)
Informed patient's daughter (okay per DPR) of results; informed to call the office with any further questions or concerns.

## 2018-08-16 NOTE — Telephone Encounter (Signed)
New Message        Patient's daughter is calling today to get lab results, would like a call back concerning this matter.

## 2018-08-18 ENCOUNTER — Ambulatory Visit: Payer: Medicare Other | Admitting: Internal Medicine

## 2018-08-22 ENCOUNTER — Ambulatory Visit (INDEPENDENT_AMBULATORY_CARE_PROVIDER_SITE_OTHER): Payer: Medicare Other | Admitting: *Deleted

## 2018-08-22 DIAGNOSIS — I495 Sick sinus syndrome: Secondary | ICD-10-CM

## 2018-08-22 NOTE — Progress Notes (Signed)
Remote pacemaker transmission.   

## 2018-08-28 ENCOUNTER — Telehealth (HOSPITAL_COMMUNITY): Payer: Self-pay | Admitting: *Deleted

## 2018-08-28 NOTE — Telephone Encounter (Signed)
Patient given detailed instructions per Myocardial Perfusion Study Information Sheet for the test on 08/30/18 at 1000. Patient notified to arrive 15 minutes early and that it is imperative to arrive on time for appointment to keep from having the test rescheduled.  If you need to cancel or reschedule your appointment, please call the office within 24 hours of your appointment. . Patient verbalized understanding.Hasspacher, [name]

## 2018-08-30 ENCOUNTER — Ambulatory Visit (HOSPITAL_COMMUNITY): Payer: Medicare Other | Attending: Cardiology

## 2018-08-30 ENCOUNTER — Other Ambulatory Visit: Payer: Self-pay

## 2018-08-30 ENCOUNTER — Ambulatory Visit (HOSPITAL_BASED_OUTPATIENT_CLINIC_OR_DEPARTMENT_OTHER): Payer: Medicare Other

## 2018-08-30 DIAGNOSIS — R0602 Shortness of breath: Secondary | ICD-10-CM

## 2018-08-30 DIAGNOSIS — R079 Chest pain, unspecified: Secondary | ICD-10-CM

## 2018-08-30 LAB — ECHOCARDIOGRAM COMPLETE
Height: 60 in
Weight: 2272 oz

## 2018-08-30 LAB — MYOCARDIAL PERFUSION IMAGING
LV dias vol: 84 mL (ref 46–106)
LV sys vol: 51 mL
Peak HR: 66 {beats}/min
Rest HR: 65 {beats}/min
SDS: 9
SRS: 9
SSS: 19
TID: 1.03

## 2018-08-30 MED ORDER — TECHNETIUM TC 99M TETROFOSMIN IV KIT
32.0000 | PACK | Freq: Once | INTRAVENOUS | Status: AC | PRN
  Administered 2018-08-30: 32 via INTRAVENOUS
  Filled 2018-08-30: qty 32

## 2018-08-30 MED ORDER — REGADENOSON 0.4 MG/5ML IV SOLN
0.4000 mg | Freq: Once | INTRAVENOUS | Status: AC
  Administered 2018-08-30: 0.4 mg via INTRAVENOUS

## 2018-08-30 MED ORDER — TECHNETIUM TC 99M TETROFOSMIN IV KIT
10.9000 | PACK | Freq: Once | INTRAVENOUS | Status: AC | PRN
  Administered 2018-08-30: 10.9 via INTRAVENOUS
  Filled 2018-08-30: qty 11

## 2018-09-01 ENCOUNTER — Other Ambulatory Visit: Payer: Self-pay | Admitting: Internal Medicine

## 2018-09-04 ENCOUNTER — Ambulatory Visit (INDEPENDENT_AMBULATORY_CARE_PROVIDER_SITE_OTHER): Payer: Medicare Other | Admitting: Adult Health

## 2018-09-04 ENCOUNTER — Encounter: Payer: Self-pay | Admitting: Adult Health

## 2018-09-04 VITALS — BP 138/80 | HR 60 | Ht 60.0 in | Wt 143.4 lb

## 2018-09-04 DIAGNOSIS — I2510 Atherosclerotic heart disease of native coronary artery without angina pectoris: Secondary | ICD-10-CM

## 2018-09-04 DIAGNOSIS — E7800 Pure hypercholesterolemia, unspecified: Secondary | ICD-10-CM | POA: Diagnosis not present

## 2018-09-04 DIAGNOSIS — R9439 Abnormal result of other cardiovascular function study: Secondary | ICD-10-CM | POA: Diagnosis not present

## 2018-09-04 DIAGNOSIS — N183 Chronic kidney disease, stage 3 unspecified: Secondary | ICD-10-CM

## 2018-09-04 DIAGNOSIS — E1122 Type 2 diabetes mellitus with diabetic chronic kidney disease: Secondary | ICD-10-CM

## 2018-09-04 NOTE — Patient Instructions (Addendum)
    [place] [address] Dept: [phone] Loc: [phone]  GWENETH FREDLUND  09/04/2018  You are scheduled for a Cardiac Catheterization on Wednesday, November 13 with Dr. Shelva Majestic.  1. Please arrive at the [facility] (Main Entrance A) at [facility]: [place], [place] [address] at 6:00 AM (This time is two hours before your procedure to ensure your preparation). Free valet parking service is available.   Special note: Every effort is made to have your procedure done on time. Please understand that emergencies sometimes delay scheduled procedures.  2. Diet: Do not eat solid foods after midnight.  The patient may have clear liquids until 5am upon the day of the procedure.  3. Labs: You will need to have blood drawn on Monday, November 11 at [place] [facility]  Open: 8am - 5pm (Lunch 12:30 - 1:30)   Phone: [phone]. You do not need to be fasting.  4. Medication instructions in preparation for your procedure:   Contrast Allergy: No  Stop taking Xarelto (Rivaroxaban) on Monday, November 11.  Stop taking, Torsemide (Demadex) Wednesday, November 13,  Do not take any insulin on the day of the procedure.   On the morning of your procedure, take your Aspirin and any morning medicines NOT listed above.  You may use sips of water.  5. Plan for one night stay--bring personal belongings. 6. Bring a current list of your medications and current insurance cards. 7. You MUST have a responsible person to drive you home. 8. Someone MUST be with you the first 24 hours after you arrive home or your discharge will be delayed. 9. Please wear clothes that are easy to get on and off and wear slip-on shoes.  Thank you for allowing [place] to care for you!   -- [facility]

## 2018-09-04 NOTE — Progress Notes (Signed)
Cardiology Office Note   Date:  09/04/2018   ID:  [name], DOB Mar 13, 1941, MRN [id number]  PCP:  Leeroy Cha, MD  Cardiologist:  [facility]  Chief Complaint  Patient presents with  . Coronary Artery Disease    Abnormal Stress test     History of Present Illness: [name] is a 77 y.o. female who presents for ongoing assessment and management of CAD with hx of CABG in 2013, LIMA to LAD, SVG to OM, SVG to RCA, chronic diastolic congestive CHF, PPM in situ for (St. Jude) in the setting of progressive symptomatic  Her daughter is a Equities trader at [employer].   She is on Xarelto 15 mg  for anticoagulation. She is also being seen by nephrology for CKD Stage III, and is on lasix. She was last seen by Dr. Debara Pickett on 06/12/2018 and had complaints of increased abdominal girth, but no significant swelling or edema. No medication changes were made. Her daughter called our office on 08/14/2018, to report that the patient was complaining of chest pain with exertion.    She had a Lexiscan stress test 08/30/2018 completed revealing   Nuclear stress EF: 39%. Inferoapical akinesis with otherwise generalized hypokinesis  There was no ST segment deviation noted during stress.  Defect 1: There is a large defect of severe severity present in the mid inferolateral, mid anterolateral, apical lateral and apex location.  Findings consistent with prior myocardial infarction with peri-infarct ischemia.  This is an intermediate risk study.  Findings are concerning for ischemia in the SVG to obtuse marginal territory.  She comes today with complaints of worsening frequency of chest discomfort. Now occurring at rest when she goes to bed. She is finding that she is more dyspneic with minimal exertion.   Past Medical History:  Diagnosis Date  . Arthritis    "in my hands"  . Carotid artery disease ([place])    a. 40-59% bilaterally 10/2015.  Marland Kitchen Chronic diastolic CHF (congestive heart  failure) ([place])    a. 05/2016 Echo: EF 60-65%, no rwma, Gr1 DD, Ao sclerosis w/o stenosis, triv MR;  b. 07/2016 TEE: EF 55-60%, no rwma, mild MR.  . CKD (chronic kidney disease), stage III ([place])   . Coronary artery disease    a. 02/2007 Persantine MV: low risk;  b. 11/2011 CABG x 3 (LIMA->LAD, VG->OM, VG->RCA);  c. 05/2016 MV: EF >65%, no isch/infarct, horiz ST dep in I, II, V5-V6.  Marland Kitchen Depression   . Diabetes mellitus   . Diverticulosis   . Esophageal stricture   . GERD (gastroesophageal reflux disease)   . Hemorrhoids   . Hiatal hernia   . Hyperkalemia    a. ARB stopped due to this.  . Hyperlipidemia   . Hypertensive heart disease   . Hypothyroidism   . Mild cognitive impairment    a. seen by neurology.  Marland Kitchen PAF (paroxysmal atrial fibrillation) (HCC)    a. post-op CABG.  . Pain    RIGHT KNEE PAIN - TORN RIGHT MEDIAL MENISCUS  . Paroxysmal atrial flutter ([place])    a. 07/2016 s/p TEE & DCCV;  b. 07/2016 Recurrent PAFlutter req initiation of amio & PPM in setting of tachy-brady;  c. CHA2DS2VASc = 7-->Xarelto 15 mg QD.  Marland Kitchen PONV (postoperative nausea and vomiting)   . S/P CABG (coronary artery bypass graft), 12/04/11 12/07/2011   LIMA to LAD, SVG to OM, SVG to RCA  . Sinus bradycardia    a. not on BB due to this.  Marland Kitchen  Tachy-brady syndrome ([place])    a. 07/2016 Jxnl brady following DCCV, recurrent Aflutter-->amio + SJM 2272 Assurity MRI DC PPM (ser # [id number]).    Past Surgical History:  Procedure Laterality Date  . ABDOMINAL HYSTERECTOMY  1980's  . BACK SURGERY  2006   "cyst growing near my spine"  . CARDIAC CATHETERIZATION  12/02/2011   mild LV dysfunction with mod hypocontractility of mid-distal anterolateral wall; CAD w/ostial tapering of L Main with 50% diffuse ostial narrowing of LAD, 99% eccentric focal prox LAD stenosis followed by 70% prox LAD stenosis after 1st diag, 20% mid LAD narrowing; 80% ostial-to-prox L Cfx stenosis & 40-50% irregularity of RCA (Dr. Corky Downs)  . CARDIOVERSION N/A  08/11/2016   Procedure: CARDIOVERSION;  Surgeon: Lelon Perla, MD;  Location: [place];  Service: Cardiovascular;  Laterality: N/A;  . CATARACT EXTRACTION W/ INTRAOCULAR LENS  IMPLANT, BILATERAL  ~ 2010  . [place]  . CORONARY ARTERY BYPASS GRAFT  12/04/2011   Procedure: CORONARY ARTERY BYPASS GRAFTING (CABG);  Surgeon: Tharon Aquas Adelene Idler, MD;  Location: [place];  Service: Open Heart Surgery;  Laterality: N/A;  CABG x three,  using left internal mammary artery, and right leg greater saphenous vein harvested endoscopically  . DILATION AND CURETTAGE OF UTERUS     "a couple times"  . EP IMPLANTABLE DEVICE N/A 08/12/2016   Procedure: Pacemaker Implant;  Surgeon: Will Meredith Leeds, MD;  Location: [place] CV [facility];  Service: Cardiovascular;  Laterality: N/A;  . FRACTURE SURGERY     "put pins both side right ankle"  . JOINT REPLACEMENT    . KNEE ARTHROSCOPY WITH MEDIAL MENISECTOMY Right 07/02/2014   Procedure: RIGHT KNEE ARTHROSCOPY WITH PARTIAL MEDIAL MENISTECTOMY, ABRASION CONDROPLASTYU OF PATELLA,ABRASION CONDROPLASTY OF MEDIAL FEMEROL CONDYL, MICROFRACTURE OF MEDIAL FEMEROL CONDYL;  Surgeon: Tobi Bastos, MD;  Location: [facility];  Service: Orthopedics;  Laterality: Right;  . LEFT HEART CATHETERIZATION WITH CORONARY ANGIOGRAM N/A 12/02/2011   Procedure: LEFT HEART CATHETERIZATION WITH CORONARY ANGIOGRAM;  Surgeon: Troy Sine, MD;  Location: [facility] CATH [facility];  Service: Cardiovascular;  Laterality: N/A;  Coronary angiogram, possible PCI  . TEE WITHOUT CARDIOVERSION N/A 08/11/2016   Procedure: TRANSESOPHAGEAL ECHOCARDIOGRAM (TEE);  Surgeon: Lelon Perla, MD;  Location: [place];  Service: Cardiovascular;  Laterality: N/A;  . TONSILLECTOMY  1949  . TOTAL KNEE ARTHROPLASTY  ~ 2006   left  . TRANSTHORACIC ECHOCARDIOGRAM  02/19/2013   EF 86-76%, grade 1 diastolic dysfunction; mildly thickend/calcified AV leaflets; mildly calcidied MV annulus; mild TR     Current Outpatient  Medications  Medication Sig Dispense Refill  . carvedilol (COREG) 6.25 MG tablet TAKE 1 TABLET BY MOUTH TWICE DAILY 60 tablet 5  . Cyanocobalamin (VITAMIN B-12 PO) Take 1 tablet by mouth daily.    Marland Kitchen esomeprazole (NEXIUM) 20 MG capsule TAKE 1 CAPSULE BY MOUTH ONCE DAILY AT  12  NOON 90 capsule 1  . ferrous sulfate 325 (65 FE) MG EC tablet Take 325 mg by mouth daily with breakfast.    . gabapentin (NEURONTIN) 300 MG capsule Take 300-600 mg by mouth See admin instructions. Take 300 mg by mouth in the morning and take 600 mg by mouth at bedtime    . LANTUS SOLOSTAR 100 UNIT/ML Solostar Pen Inject 22 Units into the skin daily.  6  . levothyroxine (SYNTHROID) 88 MCG tablet Take 1 tablet by mouth daily.    . meclizine (ANTIVERT) 25 MG tablet Take 25 mg by mouth  daily as needed for dizziness. For dizziness    . Multiple Vitamins-Minerals (PRESERVISION AREDS 2 PO) Take 1 tablet by mouth daily.    . nitroGLYCERIN (NITROSTAT) 0.4 MG SL tablet Place 1 tablet (0.4 mg total) under the tongue every 5 (five) minutes as needed for chest pain. 25 tablet 3  . Polyethyl Glycol-Propyl Glycol (SYSTANE OP) Place 1 drop into both eyes 2 (two) times daily.     Marland Kitchen torsemide (DEMADEX) 20 MG tablet Take 40 mg by mouth every morning.    Alveda Reasons 15 MG TABS tablet TAKE 1 TABLET BY MOUTH ONCE DAILY WITH SUPPER 90 tablet 1   No current facility-administered medications for this visit.     Allergies:   Clonidine derivatives; Sulfa antibiotics; Crestor [rosuvastatin calcium]; Epinephrine; Hydralazine; Losartan; and Other    Social History:  The patient  reports that she has never smoked. She has never used smokeless tobacco. She reports that she drinks alcohol. She reports that she does not use drugs.   Family History:  The patient's family history includes Breast cancer in her sister and sister; CVA in her mother; Diabetes in her mother and sister; Heart disease in her brother, father, and sister; Hyperlipidemia in her  father; Hypertension in her mother; Lung cancer in her sister.    ROS: All other systems are reviewed and negative. Unless otherwise mentioned in H&P    PHYSICAL EXAM: VS:  BP 138/80   Pulse 60   Ht 5' (1.524 m)   Wt 143 lb 6.4 oz (65 kg)   BMI 28.01 kg/m  , BMI Body mass index is 28.01 kg/m. GEN: Well nourished, well developed, in no acute distress HEENT: normal Neck: no JVD, carotid bruits, or masses Cardiac: IRRR; no murmurs, rubs, or gallops,no edema  Respiratory:  Clear to auscultation bilaterally, normal work of breathing GI: soft, nontender, nondistended, + BS MS: no deformity or atrophy Skin: warm and dry, no rash Neuro:  Strength and sensation are intact Psych: euthymic mood, full affect   EKG: Atrial paced rhythm rate of 60 bpm.   Recent Labs: 10/10/2017: NT-Pro BNP 1,437 10/31/2017: ALT 10 02/06/2018: TSH 9.240 08/15/2018: BUN 57; Creatinine, Ser 2.31; Hemoglobin 11.1; Platelets 275; Potassium 4.8; Sodium 141    Lipid Panel    Component Value Date/Time   CHOL 330 (H) 08/08/2016 2130   TRIG 313 (H) 08/08/2016 2130   HDL 42 08/08/2016 2130   CHOLHDL 7.9 08/08/2016 2130   VLDL 63 (H) 08/08/2016 2130   LDLCALC 225 (H) 08/08/2016 2130      Wt Readings from Last 3 Encounters:  09/04/18 143 lb 6.4 oz (65 kg)  09-20-2018 142 lb (64.4 kg)  08/15/18 142 lb 3.2 oz (64.5 kg)     Other studies Reviewed: Echocardiogram 2018-09-20 Left ventricle: The cavity size was normal. Systolic function was   normal. The estimated ejection fraction was in the range of 55%   to 60%. Wall motion was normal; there were no regional wall   motion abnormalities. Features are consistent with a pseudonormal   left ventricular filling pattern, with concomitant abnormal   relaxation and increased filling pressure (grade 2 diastolic   dysfunction). Doppler parameters are consistent with high   ventricular filling pressure. - Aortic valve: Trileaflet; mildly thickened, mildly calcified    leaflets. - Mitral valve: There was mild regurgitation. - Left atrium: The atrium was mildly dilated. - Right ventricle: Pacer wire or catheter noted in right ventricle. - Right atrium: Pacer wire or catheter  noted in right atrium. - Tricuspid valve: There was trivial regurgitation. - Pulmonic valve: There was trivial regurgitation.   ASSESSMENT AND PLAN:  1.  CAD: Hx of CABG in 2016 with  LIMA to LAD, SVG to OM, SVG to RCA.  Lexiscan completed on 08/30/2018 revealed ischemia in the SVG to OM territory. Due to worsening symptoms she will be scheduled for cardiac cath with Dr. Claiborne Billings on Wednesday 09/06/2018. With hx CKD Stage III, she will be brought in at 6 am for cath at 1 pm. Last creatinine on 08/15/2018 was 2.3  Repeating labs today. Once she is hydrated, I have ordered a repeat BMET at 12 noon prior to the cath at 1 pm on day of cath.  I have discussed this with Dr. Ellyn Hack, DOD at [facility] today who is in agreement with this plan.   The patient understands that risks include but are not limited to stroke (1 in 1000), death (1 in 50), kidney failure [usually temporary] (1 in 500), bleeding (1 in 200), allergic reaction [possibly serious] (1 in 200), and agrees to proceed.    2. PAF: She is on Xarelto 15 mg daily. This will be held for 48 hours prior to cardiac cath. This has been discussed with the patient and her daughter.   3. CKD Stage 3:  Repeating BMET today and on day of cath after 6 hours of hydration.   4. Hypercholesterolemia: She has been resistant to taking statins although this has been prescribed.   5. PPM in situ: Follow with EP per protocol. ([employer]).  Current medicines are reviewed at length with the patient today.    Labs/ tests ordered today include: Cardiac cath, pre labs and post hydration labs.   Phill Myron. West Pugh, ANP, AACC   09/04/2018 12:57 PM    [place] [address] Office [phone] Fax [phone]

## 2018-09-04 NOTE — H&P (View-Only) (Signed)
Cardiology Office Note   Date:  09/04/2018   ID:  [name], DOB [birth year], MRN [id number]  PCP:  Leeroy Cha, MD  Cardiologist:  [facility]  Chief Complaint  Patient presents with  . Coronary Artery Disease    Abnormal Stress test     History of Present Illness: [name] is a 77 y.o. female who presents for ongoing assessment and management of CAD with hx of CABG in 2013, LIMA to LAD, SVG to OM, SVG to RCA, chronic diastolic congestive CHF, PPM in situ for (St. Jude) in the setting of progressive symptomatic  Her daughter is a Equities trader at [employer].   She is on Xarelto 15 mg  for anticoagulation. She is also being seen by nephrology for CKD Stage III, and is on lasix. She was last seen by Dr. Debara Pickett on 06/12/2018 and had complaints of increased abdominal girth, but no significant swelling or edema. No medication changes were made. Her daughter called our office on 08/14/2018, to report that the patient was complaining of chest pain with exertion.    She had a Lexiscan stress test 08/30/2018 completed revealing   Nuclear stress EF: 39%. Inferoapical akinesis with otherwise generalized hypokinesis  There was no ST segment deviation noted during stress.  Defect 1: There is a large defect of severe severity present in the mid inferolateral, mid anterolateral, apical lateral and apex location.  Findings consistent with prior myocardial infarction with peri-infarct ischemia.  This is an intermediate risk study.  Findings are concerning for ischemia in the SVG to obtuse marginal territory.  She comes today with complaints of worsening frequency of chest discomfort. Now occurring at rest when she goes to bed. She is finding that she is more dyspneic with minimal exertion.   Past Medical History:  Diagnosis Date  . Arthritis    "in my hands"  . Carotid artery disease ([place])    a. 40-59% bilaterally 10/2015.  Marland Kitchen Chronic diastolic CHF (congestive heart  failure) ([place])    a. 05/2016 Echo: EF 60-65%, no rwma, Gr1 DD, Ao sclerosis w/o stenosis, triv MR;  b. 07/2016 TEE: EF 55-60%, no rwma, mild MR.  . CKD (chronic kidney disease), stage III ([place])   . Coronary artery disease    a. 02/2007 Persantine MV: low risk;  b. 11/2011 CABG x 3 (LIMA->LAD, VG->OM, VG->RCA);  c. 05/2016 MV: EF >65%, no isch/infarct, horiz ST dep in I, II, V5-V6.  Marland Kitchen Depression   . Diabetes mellitus   . Diverticulosis   . Esophageal stricture   . GERD (gastroesophageal reflux disease)   . Hemorrhoids   . Hiatal hernia   . Hyperkalemia    a. ARB stopped due to this.  . Hyperlipidemia   . Hypertensive heart disease   . Hypothyroidism   . Mild cognitive impairment    a. seen by neurology.  Marland Kitchen PAF (paroxysmal atrial fibrillation) (HCC)    a. post-op CABG.  . Pain    RIGHT KNEE PAIN - TORN RIGHT MEDIAL MENISCUS  . Paroxysmal atrial flutter ([place])    a. 07/2016 s/p TEE & DCCV;  b. 07/2016 Recurrent PAFlutter req initiation of amio & PPM in setting of tachy-brady;  c. CHA2DS2VASc = 7-->Xarelto 15 mg QD.  Marland Kitchen PONV (postoperative nausea and vomiting)   . S/P CABG (coronary artery bypass graft), 12/04/11 12/07/2011   LIMA to LAD, SVG to OM, SVG to RCA  . Sinus bradycardia    a. not on BB due to this.  Marland Kitchen  Tachy-brady syndrome ([place])    a. 07/2016 Jxnl brady following DCCV, recurrent Aflutter-->amio + SJM 2272 Assurity MRI DC PPM (ser # [id number]).    Past Surgical History:  Procedure Laterality Date  . ABDOMINAL HYSTERECTOMY  1980's  . BACK SURGERY  2006   "cyst growing near my spine"  . CARDIAC CATHETERIZATION  12/02/2011   mild LV dysfunction with mod hypocontractility of mid-distal anterolateral wall; CAD w/ostial tapering of L Main with 50% diffuse ostial narrowing of LAD, 99% eccentric focal prox LAD stenosis followed by 70% prox LAD stenosis after 1st diag, 20% mid LAD narrowing; 80% ostial-to-prox L Cfx stenosis & 40-50% irregularity of RCA (Dr. Corky Downs)  . CARDIOVERSION N/A  08/11/2016   Procedure: CARDIOVERSION;  Surgeon: Lelon Perla, MD;  Location: [place];  Service: Cardiovascular;  Laterality: N/A;  . CATARACT EXTRACTION W/ INTRAOCULAR LENS  IMPLANT, BILATERAL  ~ 2010  . [place]  . CORONARY ARTERY BYPASS GRAFT  12/04/2011   Procedure: CORONARY ARTERY BYPASS GRAFTING (CABG);  Surgeon: Tharon Aquas Adelene Idler, MD;  Location: [place];  Service: Open Heart Surgery;  Laterality: N/A;  CABG x three,  using left internal mammary artery, and right leg greater saphenous vein harvested endoscopically  . DILATION AND CURETTAGE OF UTERUS     "a couple times"  . EP IMPLANTABLE DEVICE N/A 08/12/2016   Procedure: Pacemaker Implant;  Surgeon: Will Meredith Leeds, MD;  Location: [place] [facility];  Service: Cardiovascular;  Laterality: N/A;  . FRACTURE SURGERY     "put pins both side right ankle"  . JOINT REPLACEMENT    . KNEE ARTHROSCOPY WITH MEDIAL MENISECTOMY Right 07/02/2014   Procedure: RIGHT KNEE ARTHROSCOPY WITH PARTIAL MEDIAL MENISTECTOMY, ABRASION CONDROPLASTYU OF PATELLA,ABRASION CONDROPLASTY OF MEDIAL FEMEROL CONDYL, MICROFRACTURE OF MEDIAL FEMEROL CONDYL;  Surgeon: Tobi Bastos, MD;  Location[facility];  Service: Orthopedics;  Laterality: Right;  . LEFT HEART CATHETERIZATION WITH CORONARY ANGIOGRAM N/A 12/02/2011   Procedure: LEFT HEART CATHETERIZATION WITH CORONARY ANGIOGRAM;  Surgeon: Troy Sine, MD;  Location: [facility] CATH LAB;  Service: Cardiovascular;  Laterality: N/A;  Coronary angiogram, possible PCI  . TEE WITHOUT CARDIOVERSION N/A 08/11/2016   Procedure: TRANSESOPHAGEAL ECHOCARDIOGRAM (TEE);  Surgeon: Lelon Perla, MD;  Location: [place];  Service: Cardiovascular;  Laterality: N/A;  . TONSILLECTOMY  1949  . TOTAL KNEE ARTHROPLASTY  ~ 2006   left  . TRANSTHORACIC ECHOCARDIOGRAM  02/19/2013   EF 09-32%, grade 1 diastolic dysfunction; mildly thickend/calcified AV leaflets; mildly calcidied MV annulus; mild TR     Current Outpatient  Medications  Medication Sig Dispense Refill  . carvedilol (COREG) 6.25 MG tablet TAKE 1 TABLET BY MOUTH TWICE DAILY 60 tablet 5  . Cyanocobalamin (VITAMIN B-12 PO) Take 1 tablet by mouth daily.    Marland Kitchen esomeprazole (NEXIUM) 20 MG capsule TAKE 1 CAPSULE BY MOUTH ONCE DAILY AT  12  NOON 90 capsule 1  . ferrous sulfate 325 (65 FE) MG EC tablet Take 325 mg by mouth daily with breakfast.    . gabapentin (NEURONTIN) 300 MG capsule Take 300-600 mg by mouth See admin instructions. Take 300 mg by mouth in the morning and take 600 mg by mouth at bedtime    . LANTUS SOLOSTAR 100 UNIT/ML Solostar Pen Inject 22 Units into the skin daily.  6  . levothyroxine (SYNTHROID) 88 MCG tablet Take 1 tablet by mouth daily.    . meclizine (ANTIVERT) 25 MG tablet Take 25 mg by mouth  daily as needed for dizziness. For dizziness    . Multiple Vitamins-Minerals (PRESERVISION AREDS 2 PO) Take 1 tablet by mouth daily.    . nitroGLYCERIN (NITROSTAT) 0.4 MG SL tablet Place 1 tablet (0.4 mg total) under the tongue every 5 (five) minutes as needed for chest pain. 25 tablet 3  . Polyethyl Glycol-Propyl Glycol (SYSTANE OP) Place 1 drop into both eyes 2 (two) times daily.     Marland Kitchen torsemide (DEMADEX) 20 MG tablet Take 40 mg by mouth every morning.    Alveda Reasons 15 MG TABS tablet TAKE 1 TABLET BY MOUTH ONCE DAILY WITH SUPPER 90 tablet 1   No current facility-administered medications for this visit.     Allergies:   Clonidine derivatives; Sulfa antibiotics; Crestor [rosuvastatin calcium]; Epinephrine; Hydralazine; Losartan; and Other    Social History:  The patient  reports that she has never smoked. She has never used smokeless tobacco. She reports that she drinks alcohol. She reports that she does not use drugs.   Family History:  The patient's family history includes Breast cancer in her sister and sister; CVA in her mother; Diabetes in her mother and sister; Heart disease in her brother, father, and sister; Hyperlipidemia in her  father; Hypertension in her mother; Lung cancer in her sister.    ROS: All other systems are reviewed and negative. Unless otherwise mentioned in H&P    PHYSICAL EXAM: VS:  BP 138/80   Pulse 60   Ht 5' (1.524 m)   Wt 143 lb 6.4 oz (65 kg)   BMI 28.01 kg/m  , BMI Body mass index is 28.01 kg/m. GEN: Well nourished, well developed, in no acute distress HEENT: normal Neck: no JVD, carotid bruits, or masses Cardiac: IRRR; no murmurs, rubs, or gallops,no edema  Respiratory:  Clear to auscultation bilaterally, normal work of breathing GI: soft, nontender, nondistended, + BS MS: no deformity or atrophy Skin: warm and dry, no rash Neuro:  Strength and sensation are intact Psych: euthymic mood, full affect   EKG: Atrial paced rhythm rate of 60 bpm.   Recent Labs: 10/10/2017: NT-Pro BNP 1,437 10/31/2017: ALT 10 02/06/2018: TSH 9.240 08/15/2018: BUN 57; Creatinine, Ser 2.31; Hemoglobin 11.1; Platelets 275; Potassium 4.8; Sodium 141    Lipid Panel    Component Value Date/Time   CHOL 330 (H) 08/08/2016 2130   TRIG 313 (H) 08/08/2016 2130   HDL 42 08/08/2016 2130   CHOLHDL 7.9 08/08/2016 2130   VLDL 63 (H) 08/08/2016 2130   LDLCALC 225 (H) 08/08/2016 2130      Wt Readings from Last 3 Encounters:  09/04/18 143 lb 6.4 oz (65 kg)  09-16-2018 142 lb (64.4 kg)  08/15/18 142 lb 3.2 oz (64.5 kg)     Other studies Reviewed: Echocardiogram 09-16-2018 Left ventricle: The cavity size was normal. Systolic function was   normal. The estimated ejection fraction was in the range of 55%   to 60%. Wall motion was normal; there were no regional wall   motion abnormalities. Features are consistent with a pseudonormal   left ventricular filling pattern, with concomitant abnormal   relaxation and increased filling pressure (grade 2 diastolic   dysfunction). Doppler parameters are consistent with high   ventricular filling pressure. - Aortic valve: Trileaflet; mildly thickened, mildly calcified    leaflets. - Mitral valve: There was mild regurgitation. - Left atrium: The atrium was mildly dilated. - Right ventricle: Pacer wire or catheter noted in right ventricle. - Right atrium: Pacer wire or catheter  noted in right atrium. - Tricuspid valve: There was trivial regurgitation. - Pulmonic valve: There was trivial regurgitation.   ASSESSMENT AND PLAN:  1.  CAD: Hx of CABG in 2016 with  LIMA to LAD, SVG to OM, SVG to RCA.  Lexiscan completed on 08/30/2018 revealed ischemia in the SVG to OM territory. Due to worsening symptoms she will be scheduled for cardiac cath with Dr. Claiborne Billings on Wednesday 09/06/2018. With hx CKD Stage III, she will be brought in at 6 am for cath at 1 pm. Last creatinine on 08/15/2018 was 2.3  Repeating labs today. Once she is hydrated, I have ordered a repeat BMET at 12 noon prior to the cath at 1 pm on day of cath.  I have discussed this with Dr. Ellyn Hack, DOD at [facility] today who is in agreement with this plan.   The patient understands that risks include but are not limited to stroke (1 in 1000), death (1 in 8), kidney failure [usually temporary] (1 in 500), bleeding (1 in 200), allergic reaction [possibly serious] (1 in 200), and agrees to proceed.    2. PAF: She is on Xarelto 15 mg daily. This will be held for 48 hours prior to cardiac cath. This has been discussed with the patient and her daughter.   3. CKD Stage 3:  Repeating BMET today and on day of cath after 6 hours of hydration.   4. Hypercholesterolemia: She has been resistant to taking statins although this has been prescribed.   5. PPM in situ: Follow with EP per protocol. ([facility]).  Current medicines are reviewed at length with the patient today.    Labs/ tests ordered today include: Cardiac cath, pre labs and post hydration labs.   Phill Myron. West Pugh, ANP, AACC   09/04/2018 12:57 PM    [place] [address] Office [phone] Fax [phone]

## 2018-09-05 ENCOUNTER — Telehealth: Payer: Self-pay | Admitting: *Deleted

## 2018-09-05 LAB — BASIC METABOLIC PANEL
BUN/Creatinine Ratio: 23 (ref 12–28)
BUN: 54 mg/dL — ABNORMAL HIGH (ref 8–27)
CO2: 25 mmol/L (ref 20–29)
Calcium: 9.2 mg/dL (ref 8.7–10.3)
Chloride: 98 mmol/L (ref 96–106)
Creatinine, Ser: 2.34 mg/dL — ABNORMAL HIGH (ref 0.57–1.00)
GFR calc Af Amer: 22 mL/min/{1.73_m2} — ABNORMAL LOW (ref >59)
GFR calc non Af Amer: 19 mL/min/{1.73_m2} — ABNORMAL LOW (ref >59)
Glucose: 166 mg/dL — ABNORMAL HIGH (ref 65–99)
Potassium: 4.7 mmol/L (ref 3.5–5.2)
Sodium: 142 mmol/L (ref 134–144)

## 2018-09-05 LAB — CBC
Hematocrit: 31.8 % — ABNORMAL LOW (ref 34.0–46.6)
Hemoglobin: 10.7 g/dL — ABNORMAL LOW (ref 11.1–15.9)
MCH: 30.7 pg (ref 26.6–33.0)
MCHC: 33.6 g/dL (ref 31.5–35.7)
MCV: 91 fL (ref 79–97)
Platelets: 248 10*3/uL (ref 150–450)
RBC: 3.48 x10E6/uL — ABNORMAL LOW (ref 3.77–5.28)
RDW: 14.1 % (ref 12.3–15.4)
WBC: 7.5 10*3/uL (ref 3.4–10.8)

## 2018-09-05 NOTE — Telephone Encounter (Signed)
Pt contacted pre-catheterization scheduled at [facility] for: Wednesday September 06, 2018 1 PM Verified arrival time and place: [place] Entr[facility] A at: 6 AM-pre procedure hydration- see lab results and office note 09/04/18.  No solid food after midnight prior to cath, clear liquids until 5 AM day of procedure. Contrast allergy: no  Hold: Insulin -AM of procedure. Torsemide-AM of procedure. Xarelto-09/04/18 until post procedure.   Except hold medications AM meds can be  taken pre-cath with sip of water including: ASA 81 mg  Confirmed patient has responsible person to drive home post procedure and for 24 hours after you arrive home: yes  I spoke with patient's daughter, [name] [facility]), she verbalized understanding, thanked me for call

## 2018-09-06 ENCOUNTER — Other Ambulatory Visit: Payer: Self-pay | Admitting: Cardiology

## 2018-09-06 ENCOUNTER — Encounter (HOSPITAL_COMMUNITY)
Admission: RE | Disposition: A | Discharge status: Home / Self Care | Payer: Self-pay | Source: Ambulatory Visit | Attending: Cardiovascular Disease

## 2018-09-06 ENCOUNTER — Ambulatory Visit (HOSPITAL_COMMUNITY)
Admission: RE | Admit: 2018-09-06 | Discharge: 2018-09-06 | Disposition: A | Discharge status: Home / Self Care | Payer: Medicare Other | Source: Ambulatory Visit | Attending: Cardiovascular Disease | Admitting: Cardiovascular Disease

## 2018-09-06 ENCOUNTER — Other Ambulatory Visit: Payer: Self-pay

## 2018-09-06 DIAGNOSIS — I6523 Occlusion and stenosis of bilateral carotid arteries: Secondary | ICD-10-CM | POA: Insufficient documentation

## 2018-09-06 DIAGNOSIS — I5032 Chronic diastolic (congestive) heart failure: Secondary | ICD-10-CM | POA: Diagnosis not present

## 2018-09-06 DIAGNOSIS — E7800 Pure hypercholesterolemia, unspecified: Secondary | ICD-10-CM | POA: Insufficient documentation

## 2018-09-06 DIAGNOSIS — N289 Disorder of kidney and ureter, unspecified: Secondary | ICD-10-CM

## 2018-09-06 DIAGNOSIS — M1990 Unspecified osteoarthritis, unspecified site: Secondary | ICD-10-CM | POA: Diagnosis not present

## 2018-09-06 DIAGNOSIS — I130 Hypertensive heart and chronic kidney disease with heart failure and stage 1 through stage 4 chronic kidney disease, or unspecified chronic kidney disease: Secondary | ICD-10-CM | POA: Diagnosis not present

## 2018-09-06 DIAGNOSIS — E1122 Type 2 diabetes mellitus with diabetic chronic kidney disease: Secondary | ICD-10-CM | POA: Diagnosis not present

## 2018-09-06 DIAGNOSIS — Z90710 Acquired absence of both cervix and uterus: Secondary | ICD-10-CM | POA: Insufficient documentation

## 2018-09-06 DIAGNOSIS — Z888 Allergy status to other drugs, medicaments and biological substances status: Secondary | ICD-10-CM | POA: Diagnosis not present

## 2018-09-06 DIAGNOSIS — Z955 Presence of coronary angioplasty implant and graft: Secondary | ICD-10-CM | POA: Diagnosis not present

## 2018-09-06 DIAGNOSIS — Z79890 Hormone replacement therapy (postmenopausal): Secondary | ICD-10-CM | POA: Diagnosis not present

## 2018-09-06 DIAGNOSIS — Z79899 Other long term (current) drug therapy: Secondary | ICD-10-CM | POA: Insufficient documentation

## 2018-09-06 DIAGNOSIS — I480 Paroxysmal atrial fibrillation: Secondary | ICD-10-CM | POA: Diagnosis not present

## 2018-09-06 DIAGNOSIS — R9439 Abnormal result of other cardiovascular function study: Secondary | ICD-10-CM | POA: Diagnosis not present

## 2018-09-06 DIAGNOSIS — Z96653 Presence of artificial knee joint, bilateral: Secondary | ICD-10-CM | POA: Insufficient documentation

## 2018-09-06 DIAGNOSIS — Z7901 Long term (current) use of anticoagulants: Secondary | ICD-10-CM | POA: Diagnosis not present

## 2018-09-06 DIAGNOSIS — N183 Chronic kidney disease, stage 3 (moderate): Secondary | ICD-10-CM | POA: Diagnosis not present

## 2018-09-06 DIAGNOSIS — Z8249 Family history of ischemic heart disease and other diseases of the circulatory system: Secondary | ICD-10-CM | POA: Insufficient documentation

## 2018-09-06 DIAGNOSIS — I25119 Atherosclerotic heart disease of native coronary artery with unspecified angina pectoris: Secondary | ICD-10-CM | POA: Insufficient documentation

## 2018-09-06 DIAGNOSIS — Z833 Family history of diabetes mellitus: Secondary | ICD-10-CM | POA: Insufficient documentation

## 2018-09-06 DIAGNOSIS — Z882 Allergy status to sulfonamides status: Secondary | ICD-10-CM | POA: Diagnosis not present

## 2018-09-06 DIAGNOSIS — E039 Hypothyroidism, unspecified: Secondary | ICD-10-CM | POA: Diagnosis not present

## 2018-09-06 DIAGNOSIS — Z951 Presence of aortocoronary bypass graft: Secondary | ICD-10-CM | POA: Insufficient documentation

## 2018-09-06 DIAGNOSIS — Z9841 Cataract extraction status, right eye: Secondary | ICD-10-CM | POA: Insufficient documentation

## 2018-09-06 DIAGNOSIS — Z9842 Cataract extraction status, left eye: Secondary | ICD-10-CM | POA: Diagnosis not present

## 2018-09-06 DIAGNOSIS — I2510 Atherosclerotic heart disease of native coronary artery without angina pectoris: Secondary | ICD-10-CM

## 2018-09-06 DIAGNOSIS — Z794 Long term (current) use of insulin: Secondary | ICD-10-CM | POA: Diagnosis not present

## 2018-09-06 DIAGNOSIS — I495 Sick sinus syndrome: Secondary | ICD-10-CM | POA: Insufficient documentation

## 2018-09-06 DIAGNOSIS — Z9889 Other specified postprocedural states: Secondary | ICD-10-CM | POA: Insufficient documentation

## 2018-09-06 DIAGNOSIS — K219 Gastro-esophageal reflux disease without esophagitis: Secondary | ICD-10-CM | POA: Diagnosis not present

## 2018-09-06 DIAGNOSIS — I2582 Chronic total occlusion of coronary artery: Secondary | ICD-10-CM | POA: Insufficient documentation

## 2018-09-06 DIAGNOSIS — Z823 Family history of stroke: Secondary | ICD-10-CM | POA: Insufficient documentation

## 2018-09-06 HISTORY — PX: LEFT HEART CATH AND CORS/GRAFTS ANGIOGRAPHY: CATH118250

## 2018-09-06 LAB — GLUCOSE, CAPILLARY
Glucose-Capillary: 103 mg/dL — ABNORMAL HIGH (ref 70–99)
Glucose-Capillary: 70 mg/dL (ref 70–99)

## 2018-09-06 SURGERY — LEFT HEART CATH AND CORS/GRAFTS ANGIOGRAPHY
Anesthesia: LOCAL

## 2018-09-06 MED ORDER — SODIUM CHLORIDE 0.9 % IV SOLN
INTRAVENOUS | Status: AC
  Administered 2018-09-06: 08:00:00 via INTRAVENOUS

## 2018-09-06 MED ORDER — IOHEXOL 350 MG/ML SOLN
INTRAVENOUS | Status: DC | PRN
  Administered 2018-09-06: 70 mL via INTRACARDIAC

## 2018-09-06 MED ORDER — LABETALOL HCL 5 MG/ML IV SOLN
INTRAVENOUS | Status: AC
  Filled 2018-09-06: qty 4

## 2018-09-06 MED ORDER — LIDOCAINE HCL (PF) 1 % IJ SOLN
INTRAMUSCULAR | Status: DC | PRN
  Administered 2018-09-06: 15 mL

## 2018-09-06 MED ORDER — ONDANSETRON HCL 4 MG/2ML IJ SOLN: 4.0000 mg | Freq: Four times a day (QID) | INTRAMUSCULAR | Status: DC | PRN

## 2018-09-06 MED ORDER — ACETAMINOPHEN 325 MG PO TABS: 650.0000 mg | ORAL_TABLET | ORAL | Status: DC | PRN

## 2018-09-06 MED ORDER — SODIUM CHLORIDE 0.9% FLUSH: 3.0000 mL | Freq: Two times a day (BID) | INTRAVENOUS | Status: DC

## 2018-09-06 MED ORDER — MIDAZOLAM HCL 2 MG/2ML IJ SOLN
INTRAMUSCULAR | Status: DC | PRN
  Administered 2018-09-06: 2 mg via INTRAVENOUS
  Administered 2018-09-06: 1 mg via INTRAVENOUS

## 2018-09-06 MED ORDER — FENTANYL CITRATE (PF) 100 MCG/2ML IJ SOLN
INTRAMUSCULAR | Status: DC | PRN
  Administered 2018-09-06 (×2): 25 ug via INTRAVENOUS

## 2018-09-06 MED ORDER — MIDAZOLAM HCL 2 MG/2ML IJ SOLN
INTRAMUSCULAR | Status: AC
  Filled 2018-09-06: qty 2

## 2018-09-06 MED ORDER — HEPARIN (PORCINE) IN NACL 1000-0.9 UT/500ML-% IV SOLN
INTRAVENOUS | Status: DC | PRN
  Administered 2018-09-06 (×2): 500 mL

## 2018-09-06 MED ORDER — LABETALOL HCL 5 MG/ML IV SOLN: 10.0000 mg | INTRAVENOUS | Status: DC | PRN

## 2018-09-06 MED ORDER — SODIUM CHLORIDE 0.9 % IV SOLN: 250.0000 mL | INTRAVENOUS | Status: DC | PRN

## 2018-09-06 MED ORDER — SODIUM CHLORIDE 0.9% FLUSH: 3.0000 mL | INTRAVENOUS | Status: DC | PRN

## 2018-09-06 MED ORDER — FENTANYL CITRATE (PF) 100 MCG/2ML IJ SOLN
INTRAMUSCULAR | Status: AC
  Filled 2018-09-06: qty 2

## 2018-09-06 MED ORDER — SODIUM CHLORIDE 0.9 % IV SOLN: INTRAVENOUS | Status: DC

## 2018-09-06 MED ORDER — ASPIRIN 81 MG PO CHEW: 81.0000 mg | CHEWABLE_TABLET | ORAL | Status: DC

## 2018-09-06 MED ORDER — LIDOCAINE HCL (PF) 1 % IJ SOLN
INTRAMUSCULAR | Status: AC
  Filled 2018-09-06: qty 30

## 2018-09-06 MED ORDER — LABETALOL HCL 5 MG/ML IV SOLN
INTRAVENOUS | Status: DC | PRN
  Administered 2018-09-06: 10 mg via INTRAVENOUS

## 2018-09-06 MED ORDER — AMLODIPINE BESYLATE 5 MG PO TABS: 5.0000 mg | ORAL_TABLET | Freq: Every day | ORAL | Status: DC

## 2018-09-06 MED ORDER — CLOPIDOGREL BISULFATE 75 MG PO TABS: 75.0000 mg | ORAL_TABLET | Freq: Every day | ORAL | Status: DC

## 2018-09-06 MED ORDER — LABETALOL HCL 5 MG/ML IV SOLN
10.0000 mg | Freq: Once | INTRAVENOUS | Status: AC
  Administered 2018-09-06: 10 mg via INTRAVENOUS

## 2018-09-06 MED ORDER — CLOPIDOGREL BISULFATE 75 MG PO TABS: 75.0000 mg | ORAL_TABLET | Freq: Every day | ORAL | 4 refills | Status: DC

## 2018-09-06 MED ORDER — HYDRALAZINE HCL 20 MG/ML IJ SOLN
INTRAMUSCULAR | Status: AC
  Filled 2018-09-06: qty 1

## 2018-09-06 MED ORDER — AMLODIPINE BESYLATE 5 MG PO TABS: 5.0000 mg | ORAL_TABLET | Freq: Every day | ORAL | 4 refills | Status: DC

## 2018-09-06 MED ORDER — HEPARIN (PORCINE) IN NACL 1000-0.9 UT/500ML-% IV SOLN
INTRAVENOUS | Status: AC
  Filled 2018-09-06: qty 1000

## 2018-09-06 SURGICAL SUPPLY — 10 items
CATH INFINITI 5 FR IM (CATHETERS) ×2 IMPLANT
CATH INFINITI 5FR MULTPACK ANG (CATHETERS) ×2 IMPLANT
KIT HEART LEFT (KITS) ×2 IMPLANT
PACK CARDIAC CATHETERIZATION (CUSTOM PROCEDURE TRAY) ×2 IMPLANT
SHEATH PINNACLE 5F 10CM (SHEATH) ×2 IMPLANT
TRANSDUCER W/STOPCOCK (MISCELLANEOUS) ×2 IMPLANT
TUBING CIL FLEX 10 FLL-RA (TUBING) ×2 IMPLANT
WIRE EMERALD 3MM-J .035X150CM (WIRE) ×4 IMPLANT
WIRE EMERALD 3MM-J .035X260CM (WIRE) ×2 IMPLANT
WIRE HI TORQ VERSACORE-J 145CM (WIRE) ×2 IMPLANT

## 2018-09-06 NOTE — Progress Notes (Signed)
CBG in holding area 70. Asymptomatic. 8 oz apple juice given PO. Sandwich will be sent with patient to short stay.

## 2018-09-06 NOTE — CV Procedure (Signed)
5 Fr. [name] was pullled manually from the R FA, with 25 min hold. R DP was palpable after the sheath pull. Pt tolerated the procedure well, and instruction were given about the bed rest. It will start at 1415.

## 2018-09-06 NOTE — Discharge Instructions (Signed)
Come to admitting on Wednesday November 20th @ 6:00 for cath procedure at 1:00pm.    Nothing to eat or drink after midnight on Tuesday.    Resume Xarelto tomorrow, take until Sunday, Sunday will be last dose of Xarelto.  Resume taken the [facility] tomorrow, take until Monday, Monday will be last dose of Demadex.  Adding 2 new medicines- Plavix 75 once a day and Amlodipine 5mg  once a day.  Go to office Monday to have kidney function checked.   Femoral Site Care Refer to this sheet in the next few weeks. These instructions provide you with information about caring for yourself after your procedure. Your health care provider may also give you more specific instructions. Your treatment has been planned according to current medical practices, but problems sometimes occur. Call your health care provider if you have any problems or questions after your procedure. What can I expect after the procedure? After your procedure, it is typical to have the following:  Bruising at the site that usually fades within 1-2 weeks.  Blood collecting in the tissue (hematoma) that may be painful to the touch. It should usually decrease in size and tenderness within 1-2 weeks.  Follow these instructions at home:  Take medicines only as directed by your health care provider.  You may shower 24-48 hours after the procedure or as directed by your health care provider. Remove the bandage (dressing) and gently wash the site with plain soap and water. Pat the area dry with a clean towel. Do not rub the site, because this may cause bleeding.  Do not take baths, swim, or use a hot tub until your health care provider approves.  Check your insertion site every day for redness, swelling, or drainage.  Do not apply powder or lotion to the site.  Limit use of stairs to twice a day for the first 2-3 days or as directed by your health care provider.  Do not squat for the first 2-3 days or as directed by your health care  provider.  Do not lift over 10 lb (4.5 kg) for 5 days after your procedure or as directed by your health care provider.  Ask your health care provider when it is okay to: ? Return to work or school. ? Resume usual physical activities or sports. ? Resume sexual activity.  Do not drive home if you are discharged the same day as the procedure. Have someone else drive you.  You may drive 24 hours after the procedure unless otherwise instructed by your health care provider.  Do not operate machinery or power tools for 24 hours after the procedure or as directed by your health care provider.  If your procedure was done as an outpatient procedure, which means that you went home the same day as your procedure, a responsible adult should be with you for the first 24 hours after you arrive home.  Keep all follow-up visits as directed by your health care provider. This is important. Contact a health care provider if:  You have a fever.  You have chills.  You have increased bleeding from the site. Hold pressure on the site. Get help right away if:  You have unusual pain at the site.  You have redness, warmth, or swelling at the site.  You have drainage (other than a small amount of blood on the dressing) from the site.  The site is bleeding, and the bleeding does not stop after 30 minutes of holding steady pressure on the  site.  Your leg or foot becomes pale, cool, tingly, or numb. This information is not intended to replace advice given to you by your health care provider. Make sure you discuss any questions you have with your health care provider. Document Released: 06/14/2014 Document Revised: 03/18/2016 Document Reviewed: 04/30/2014 Elsevier Interactive Patient Education  [employer].

## 2018-09-06 NOTE — Progress Notes (Signed)
Dr Claiborne Billings was called to clarify Xarelto  /Plavix administration. States the daughter has all instructions and has verbalized understanding.

## 2018-09-06 NOTE — Interval H&P Note (Signed)
Cath Lab Visit (complete for each Cath Lab visit)  Clinical Evaluation Leading to the Procedure:   ACS: No.  Non-ACS:    Anginal Classification: CCS III  Anti-ischemic medical therapy: Minimal Therapy (1 class of medications)  Non-Invasive Test Results: Intermediate-risk stress test findings: cardiac mortality 1-3%/year  Prior CABG: Previous CABG      History and Physical Interval Note:  09/06/2018 12:22 PM  [name]  has presented today for surgery, with the diagnosis of abn stress  The various methods of treatment have been discussed with the patient and family. After consideration of risks, benefits and other options for treatment, the patient has consented to  Procedure(s): LEFT HEART CATH AND CORS/GRAFTS ANGIOGRAPHY (N/A) as a surgical intervention .  The patient's history has been reviewed, patient examined, no change in status, stable for surgery.  I have reviewed the patient's chart and labs.  Questions were answered to the patient's satisfaction.     Shelva Majestic

## 2018-09-07 ENCOUNTER — Encounter (HOSPITAL_COMMUNITY): Payer: Self-pay | Admitting: Cardiovascular Disease

## 2018-09-11 ENCOUNTER — Encounter: Payer: Self-pay | Admitting: Adult Health

## 2018-09-11 ENCOUNTER — Telehealth: Payer: Self-pay | Admitting: Adult Health

## 2018-09-11 ENCOUNTER — Ambulatory Visit: Payer: Medicare Other | Admitting: Adult Health

## 2018-09-11 VITALS — BP 124/59 | HR 63 | Ht 60.0 in | Wt 146.8 lb

## 2018-09-11 DIAGNOSIS — I2510 Atherosclerotic heart disease of native coronary artery without angina pectoris: Secondary | ICD-10-CM

## 2018-09-11 DIAGNOSIS — N289 Disorder of kidney and ureter, unspecified: Secondary | ICD-10-CM

## 2018-09-11 DIAGNOSIS — Z79899 Other long term (current) drug therapy: Secondary | ICD-10-CM

## 2018-09-11 DIAGNOSIS — I5032 Chronic diastolic (congestive) heart failure: Secondary | ICD-10-CM

## 2018-09-11 NOTE — Telephone Encounter (Signed)
New Message   Pt c/o swelling: STAT is pt has developed SOB within 24 hours  1) How much weight have you gained and in what time span? 4lbs in 5 days   2) If swelling, where is the swelling located? Some in feet and legs   3) Are you currently taking a fluid pill? yes  4) Are you currently SOB? Yes but that has been an issue and on and off all weekend  5) Do you have a log of your daily weights (if so, list)? 142, 146  6) Have you gained 3 pounds in a day or 5 pounds in a week?   7) Have you traveled recently? No    Patient daughter [name] is calling on her behalf. She states the patient is complaining of shortness of breath as well as some swelling in her feet and legs. She also is complaining about fluid in her chest. The patient is to come in for labs today to check kidney function but they are wondering can they be seen. Please call to discuss.

## 2018-09-11 NOTE — Progress Notes (Signed)
Cardiology Office Note   Date:  09/11/2018   ID:  PAHOLA Walker, DOB 1941-04-26, MRN [id number]  PCP:  Leeroy Cha, MD  Cardiologist:  Dr. Debara Pickett  Chief Complaint  Patient presents with  . Shortness of Breath  . Weight Gain     History of Present Illness: [name] is a 77 y.o. female who presents for post hospital follow up after having cardiac cath due to abnormal stress test. Cath on 09/06/2018 revealed total occlusion of the LAD at the ostium, 95% proximal left circumflex stenosis, with  95% bifurcation stenosis involving the AV groove Cx and OM vessel. She is to have a staged procedure for high grade graft disease with 95% near ostial stenosis in the graft supplying the marginal vessel. Because of CKD she was sent home from initial cath for hydration. She was started on Plavix 75 mg and amlodipine 5 mg daily. She is to stop Xarelto after her dose on November 17, and to have BMET on 09/11/2018 with intervention planned on 09/13/2018.   She comes today as an add-on at the request of her daughter who is an Therapist, sports at [employer]. The patient has become more listless, weak, and gaining weight since she has had recent procedure with medication changes. There is concern for having the procedure with hydration on the part of her daughter.   The patient states that her stomach feels full and she is not hungry any longer. She states she cannot do her laundry or housework without extreme fatigue.   Past Medical History:  Diagnosis Date  . Arthritis    "in my hands"  . Carotid artery disease ([name])    a. 40-59% bilaterally 10/2015.  [name] Chronic diastolic CHF (congestive heart failure) ([name])    a. 05/2016 Echo: EF 60-65%, no rwma, Gr1 DD, Ao sclerosis w/o stenosis, triv MR;  b. 07/2016 TEE: EF 55-60%, no rwma, mild MR.  . CKD (chronic kidney disease), stage III ([name])   . Coronary artery disease    a. 02/2007 Persantine MV: low risk;  b. 11/2011 CABG x 3 (LIMA->LAD, VG->OM, VG->RCA);   c. 05/2016 MV: EF >65%, no isch/infarct, horiz ST dep in I, II, V5-V6.  [name] Depression   . Diabetes mellitus   . Diverticulosis   . Esophageal stricture   . GERD (gastroesophageal reflux disease)   . Hemorrhoids   . Hiatal hernia   . Hyperkalemia    a. ARB stopped due to this.  . Hyperlipidemia   . Hypertensive heart disease   . Hypothyroidism   . Mild cognitive impairment    a. seen by neurology.  [name] PAF (paroxysmal atrial fibrillation) (HCC)    a. post-op CABG.  . Pain    RIGHT KNEE PAIN - TORN RIGHT MEDIAL MENISCUS  . Paroxysmal atrial flutter ([name])    a. 07/2016 s/p TEE & DCCV;  b. 07/2016 Recurrent PAFlutter req initiation of amio & PPM in setting of tachy-brady;  c. CHA2DS2VASc = 7-->Xarelto 15 mg QD.  [name] PONV (postoperative nausea and vomiting)   . S/P CABG (coronary artery bypass graft), 12/04/11 12/07/2011   LIMA to LAD, SVG to OM, SVG to RCA  . Sinus bradycardia    a. not on BB due to this.  . Tachy-brady syndrome ([name])    a. 07/2016 Jxnl brady following DCCV, recurrent Aflutter-->amio + SJM 2272 Assurity MRI DC PPM (ser # [id number]).    Past Surgical History:  Procedure Laterality Date  . ABDOMINAL HYSTERECTOMY  1980's  .  BACK SURGERY  2006   "cyst growing near my spine"  . CARDIAC CATHETERIZATION  12/02/2011   mild LV dysfunction with mod hypocontractility of mid-distal anterolateral wall; CAD w/ostial tapering of L Main with 50% diffuse ostial narrowing of LAD, 99% eccentric focal prox LAD stenosis followed by 70% prox LAD stenosis after 1st diag, 20% mid LAD narrowing; 80% ostial-to-prox L Cfx stenosis & 40-50% irregularity of RCA (Dr. Corky Downs)  . CARDIOVERSION N/A 08/11/2016   Procedure: CARDIOVERSION;  Surgeon: Lelon Perla, MD;  Location: [place];  Service: Cardiovascular;  Laterality: N/A;  . CATARACT EXTRACTION W/ INTRAOCULAR LENS  IMPLANT, BILATERAL  ~ 2010  . [place]  . CORONARY ARTERY BYPASS GRAFT  12/04/2011   Procedure: CORONARY ARTERY  BYPASS GRAFTING (CABG);  Surgeon: Tharon Aquas Adelene Idler, MD;  Location: [name];  Service: Open Heart Surgery;  Laterality: N/A;  CABG x three,  using left internal mammary artery, and right leg greater saphenous vein harvested endoscopically  . DILATION AND CURETTAGE OF UTERUS     "a couple times"  . EP IMPLANTABLE DEVICE N/A 08/12/2016   Procedure: Pacemaker Implant;  Surgeon: Will Meredith Leeds, MD;  Location: [name];  Service: Cardiovascular;  Laterality: N/A;  . FRACTURE SURGERY     "put pins both side right ankle"  . JOINT REPLACEMENT    . KNEE ARTHROSCOPY WITH MEDIAL MENISECTOMY Right 07/02/2014   Procedure: RIGHT KNEE ARTHROSCOPY WITH PARTIAL MEDIAL MENISTECTOMY, ABRASION CONDROPLASTYU OF PATELLA,ABRASION CONDROPLASTY OF MEDIAL FEMEROL CONDYL, MICROFRACTURE OF MEDIAL FEMEROL CONDYL;  Surgeon: Tobi Bastos, MD;  Location: [facility];  Service: Orthopedics;  Laterality: Right;  . LEFT HEART CATH AND CORS/GRAFTS ANGIOGRAPHY N/A 09/06/2018   Procedure: LEFT HEART CATH AND CORS/GRAFTS ANGIOGRAPHY;  Surgeon: Troy Sine, MD;  Location: [place] [facility];  Service: Cardiovascular;  Laterality: N/A;  . LEFT HEART CATHETERIZATION WITH CORONARY ANGIOGRAM N/A 12/02/2011   Procedure: LEFT HEART CATHETERIZATION WITH CORONARY ANGIOGRAM;  Surgeon: Troy Sine, MD;  Location: [name];  Service: Cardiovascular;  Laterality: N/A;  Coronary angiogram, possible PCI  . TEE WITHOUT CARDIOVERSION N/A 08/11/2016   Procedure: TRANSESOPHAGEAL ECHOCARDIOGRAM (TEE);  Surgeon: Lelon Perla, MD;  Location: [place];  Service: Cardiovascular;  Laterality: N/A;  . TONSILLECTOMY  1949  . TOTAL KNEE ARTHROPLASTY  ~ 2006   left  . TRANSTHORACIC ECHOCARDIOGRAM  02/19/2013   EF 53-97%, grade 1 diastolic dysfunction; mildly thickend/calcified AV leaflets; mildly calcidied MV annulus; mild TR     Current Outpatient Medications  Medication Sig Dispense Refill  . amLODipine (NORVASC) 5 MG tablet  Take 1 tablet (5 mg total) by mouth daily. 90 tablet 4  . Ascorbic Acid (VITAMIN C PO) Take 1 tablet by mouth 3 (three) times a week.    . carvedilol (COREG) 6.25 MG tablet TAKE 1 TABLET BY MOUTH TWICE DAILY 60 tablet 11  . clopidogrel (PLAVIX) 75 MG tablet Take 1 tablet (75 mg total) by mouth daily. 90 tablet 4  . Cyanocobalamin (VITAMIN B-12 PO) Take 1 tablet by mouth 3 (three) times a week.     . esomeprazole (NEXIUM) 20 MG capsule TAKE 1 CAPSULE BY MOUTH ONCE DAILY AT  12  NOON (Patient taking differently: Take 20 mg by mouth daily at 12 noon. ) 90 capsule 1  . ferrous sulfate 325 (65 FE) MG EC tablet Take 325 mg by mouth daily with breakfast.    . gabapentin (NEURONTIN) 300 MG capsule  Take 300-600 mg by mouth 2 (two) times daily.     [name] LANTUS SOLOSTAR 100 UNIT/ML Solostar Pen Inject 22 Units into the skin daily.  6  . levothyroxine (SYNTHROID) 88 MCG tablet Take 88 mcg by mouth daily before breakfast.     . meclizine (ANTIVERT) 25 MG tablet Take 25 mg by mouth daily as needed for dizziness. For dizziness    . Multiple Vitamins-Minerals (PRESERVISION AREDS 2 PO) Take 1 tablet by mouth daily.    . nitroGLYCERIN (NITROSTAT) 0.4 MG SL tablet Place 1 tablet (0.4 mg total) under the tongue every 5 (five) minutes as needed for chest pain. 25 tablet 3  . Polyethyl Glycol-Propyl Glycol (SYSTANE OP) Place 1 drop into both eyes 2 (two) times daily.     [name] torsemide (DEMADEX) 20 MG tablet Take 40 mg by mouth every morning.    Alveda Reasons 15 MG TABS tablet TAKE 1 TABLET BY MOUTH ONCE DAILY WITH SUPPER (Patient taking differently: Take 15 mg by mouth daily with breakfast. ) 90 tablet 1   No current facility-administered medications for this visit.     Allergies:   Clonidine derivatives; Sulfa antibiotics; Crestor [rosuvastatin calcium]; Epinephrine; Hydralazine; Losartan; and Other    Social History:  The patient  reports that she has never smoked. She has never used smokeless tobacco. She reports that  she drinks alcohol. She reports that she does not use drugs.   Family History:  The patient's family history includes Breast cancer in her sister and sister; CVA in her mother; Diabetes in her mother and sister; Heart disease in her brother, father, and sister; Hyperlipidemia in her father; Hypertension in her mother; Lung cancer in her sister.    ROS: All other systems are reviewed and negative. Unless otherwise mentioned in H&P    PHYSICAL EXAM: VS:  BP (!) 124/59   Pulse 63   Ht 5' (1.524 m)   Wt 146 lb 12.8 oz (66.6 kg)   SpO2 98%   BMI 28.67 kg/m  , BMI Body mass index is 28.67 kg/m. GEN: Well nourished, well developed, in no acute distress HEENT: normal Neck: no JVD, carotid bruits, or masses Cardiac: RRR; no murmurs, rubs, or gallops,no edema  Respiratory:  Clear to auscultation bilaterally in the upper lobes, but bibasilar crackles in the basrs, R.L.  GI: Mild distention is noted.  MS: no deformity or atrophy Skin: warm and dry, no rash Neuro:  Strength and sensation are intact Psych: euthymic mood, full affect   EKG:  Not completed this office visit.   Recent Labs: 10/10/2017: NT-Pro BNP 1,437 10/31/2017: ALT 10 02/06/2018: TSH 9.240 09/04/2018: BUN 54; Creatinine, Ser 2.34; Hemoglobin 10.7; Platelets 248; Potassium 4.7; Sodium 142    Lipid Panel    Component Value Date/Time   CHOL 330 (H) 08/08/2016 2130   TRIG 313 (H) 08/08/2016 2130   HDL 42 08/08/2016 2130   CHOLHDL 7.9 08/08/2016 2130   VLDL 63 (H) 08/08/2016 2130   LDLCALC 225 (H) 08/08/2016 2130      Wt Readings from Last 3 Encounters:  09/11/18 146 lb 12.8 oz (66.6 kg)  09/06/18 142 lb (64.4 kg)  09/04/18 143 lb 6.4 oz (65 kg)     Other studies Reviewed: Conclusion     Prox RCA to Mid RCA lesion is 95% stenosed.  Ost LAD to Prox LAD lesion is 100% stenosed.  Ost Cx to Mid Cx lesion is 95% stenosed.  Mid Cx to Dist Cx lesion is 95%  stenosed.  Ost 2nd Mrg lesion is 95%  stenosed.  Origin lesion is 95% stenosed.  Mid Graft lesion is 50% stenosed.  Mid LAD to Dist LAD lesion is 90% stenosed.  Prox LAD lesion is 90% stenosed.   Severe native CAD with total occlusion of the LAD at the ostium, 95% ostial proximal left circumflex stenosis with 95% bifurcation stenoses involving the AV groove circumflex and OM vessel; and long 95% proximal RCA stenosis.  Patent LIMA graft supplying the LAD with evidence for mid distal focal 90% LAD stenosis beyond the graft insertion and diffuse 90% stenosis of the LAD proximal to the graft insertion.  SVG to circumflex marginal vessel with 95% near ostial stenosis followed by 50% smooth mid stenosis. Patent SVG supplying the distal RCA.  RECOMMENDATION: Ms. Hoaglund has stage IV chronic kidney disease with creatinine 2.37 at the time of her catheterization study.  Catheterization was done with limited contrast.  She has high-grade graft disease with 95% near ostial stenosis in the graft supplying the marginal vessel which most likely accounts for abnormal nuclear study.  In addition, there is focal 90% stenosis in a small caliber mid distal LAD beyond the LIMA insertion.   The patient will be discharged today following hydration.  She will be scheduled to undergo PCI in 1 week to allow for renal function stabilization prior to additional contrast.  She was instructed to resume Xarelto tomorrow.  She was also started on Plavix 75 mg as well as amlodipine 5 mg.  She will hold Xarelto after her dose on November 17, return to our office for a be met on November 18 and she will hold her torsemide after that day in anticipation of intervention on September 13, 2018- (Dr. Claiborne Billings ).   ASSESSMENT AND PLAN:  1.  CAD: Recent cardiac cath as above with pre-procedure hydration. She was started on amlodipine and Plavix after her cath with planned staged procedure on November 20 for intervention into a high grade ostial stenosis in the graft  supplying the marginal vessel with additional focal 90% stenosis in a small caliber mid distal LAD beyond the LIMA insertion.   She appears now to be in fluid overload with weight gain, abdominal distention and coughing. Dr. Debara Pickett has graciously agreed to step in and discuss options with the patient and her daughter concerning the planned procedure and CHF treatment.   It is recommended that the patient take an extra dose of torsemide 40 mg this afternoon, and also take torsemide tomorrow morning (she has previously been told to hold the torsemide the day before the cath). There is a concern for worsening CHF if she does not have increased diureses, as she is fluid overloaded on this exam.   She will have her labs drawn today prior to leaving. Then will repeat labs on am of cardiac cath. It is recommended that her IV hydration be reduced to 50 cc an hour instead of 100 cc an hour prior to the cath.  We will reduce amlodipine to 2.5 mg daily from 5 mg daily.   Multiple questions from the patient's daughter were answered by Dr. Debara Pickett and by myself. They verbalize understanding.   Current medicines are reviewed at length with the patient today.  Greater than 45 minutes was spent with the patient, her daughter, and also with Dr. Debara Pickett to go over plans and answer questions concerning the planned staged procedure and renal function.   Labs/ tests ordered today include: BMET and CBC.  Phill Myron. [name] Pugh, ANP, AACC   09/11/2018 12:27 PM    [place] [address] Office [phone] Fax [phone]

## 2018-09-11 NOTE — Patient Instructions (Signed)
Medication Instructions:  RESTART TORSEMIDE 40MG -TAKE  TONIGHT AND  TOMORROW MORNING  DECREASE AMLODIPINE 2.5MG  DAILY  If you need a refill on your cardiac medications before your next appointment, please call your pharmacy.  Labwork: CBC AND BMET TODAT HERE IN OUR OFFICE AT [facility]  Take the provided lab slips with you to the lab for your blood draw.   If you have labs (blood work) drawn today and your tests are completely normal, you will receive your results only by: Marland Kitchen MyChart Message (if you have MyChart) OR . A paper copy in the mail If you have any lab test that is abnormal or we need to change your treatment, we will call you to review the results.  Special Instructions: DO NOT TAKE TORSEMIDE THE MORNING OF THE PROCEDURE  OR XARELTHE DAY BEFORE OR THE MORNING OF THE PROCEDURE  MAKE SURE YOU TAKE YOUR OTHER MORNING MEDICATIONS AND YOUR PLAVIX THE MORNING OF THE PROCEDURE  Follow-Up: You will need a follow up appointment in 2 WEEKS.    You may see  DR Cleon Dew, DNP, AACC   At [facility], you and your health needs are our priority.  As part of our continuing mission to provide you with exceptional heart care, we have created designated Provider Care Teams.  These Care Teams include your primary Cardiologist (physician) and Advanced Practice Providers (APPs -  Physician Assistants and Nurse Practitioners) who all work together to provide you with the care you need, when you need it.  Thank you for choosing [facility] at [facility]!!

## 2018-09-11 NOTE — Telephone Encounter (Signed)
Spoke with pt daughter [name]. Pt has had a 4 lb weight gain in 5 days. Pt has increased sob and LE swelling. [name] sts that the pt was to come to the office today for lab work and she would like to know it the pt can be seen. appt scheduled with Jory Sims, DNP today @ 11:30am. Truett Mainland that the lab work can be completed after the visit with Curt Bears in case additional labs need to be ordered. [name] verbalized understanding and voiced appreciation for the assistance.

## 2018-09-12 ENCOUNTER — Other Ambulatory Visit: Payer: Self-pay | Admitting: Cardiology

## 2018-09-12 DIAGNOSIS — N289 Disorder of kidney and ureter, unspecified: Secondary | ICD-10-CM

## 2018-09-12 LAB — CBC
Hematocrit: 26.8 % — ABNORMAL LOW (ref 34.0–46.6)
Hemoglobin: 9 g/dL — ABNORMAL LOW (ref 11.1–15.9)
MCH: 30.9 pg (ref 26.6–33.0)
MCHC: 33.6 g/dL (ref 31.5–35.7)
MCV: 92 fL (ref 79–97)
Platelets: 205 10*3/uL (ref 150–450)
RBC: 2.91 x10E6/uL — ABNORMAL LOW (ref 3.77–5.28)
RDW: 14.2 % (ref 12.3–15.4)
WBC: 7.7 10*3/uL (ref 3.4–10.8)

## 2018-09-12 LAB — BASIC METABOLIC PANEL
BUN/Creatinine Ratio: 22 (ref 12–28)
BUN: 60 mg/dL — ABNORMAL HIGH (ref 8–27)
CO2: 22 mmol/L (ref 20–29)
Calcium: 8.6 mg/dL — ABNORMAL LOW (ref 8.7–10.3)
Chloride: 103 mmol/L (ref 96–106)
Creatinine, Ser: 2.67 mg/dL — ABNORMAL HIGH (ref 0.57–1.00)
GFR calc Af Amer: 19 mL/min/{1.73_m2} — ABNORMAL LOW (ref >59)
GFR calc non Af Amer: 16 mL/min/{1.73_m2} — ABNORMAL LOW (ref >59)
Glucose: 178 mg/dL — ABNORMAL HIGH (ref 65–99)
Potassium: 4.8 mmol/L (ref 3.5–5.2)
Sodium: 142 mmol/L (ref 134–144)

## 2018-09-13 ENCOUNTER — Ambulatory Visit: Payer: Medicare Other | Admitting: Adult Health

## 2018-09-13 ENCOUNTER — Encounter (HOSPITAL_COMMUNITY): Admission: RE | Payer: Self-pay | Source: Ambulatory Visit

## 2018-09-13 ENCOUNTER — Ambulatory Visit (HOSPITAL_COMMUNITY): Admission: RE | Admit: 2018-09-13 | Payer: Medicare Other | Source: Ambulatory Visit | Admitting: Cardiovascular Disease

## 2018-09-13 SURGERY — CORONARY STENT INTERVENTION
Anesthesia: LOCAL

## 2018-09-18 ENCOUNTER — Other Ambulatory Visit: Payer: Self-pay | Admitting: *Deleted

## 2018-09-18 ENCOUNTER — Other Ambulatory Visit: Payer: Self-pay

## 2018-09-18 DIAGNOSIS — N183 Chronic kidney disease, stage 3 unspecified: Secondary | ICD-10-CM

## 2018-09-18 DIAGNOSIS — I10 Essential (primary) hypertension: Secondary | ICD-10-CM

## 2018-09-18 DIAGNOSIS — I4892 Unspecified atrial flutter: Secondary | ICD-10-CM

## 2018-09-18 DIAGNOSIS — I5031 Acute diastolic (congestive) heart failure: Secondary | ICD-10-CM

## 2018-09-18 DIAGNOSIS — I5033 Acute on chronic diastolic (congestive) heart failure: Secondary | ICD-10-CM

## 2018-09-18 LAB — BASIC METABOLIC PANEL
BUN/Creatinine Ratio: 22 (ref 12–28)
BUN: 51 mg/dL — ABNORMAL HIGH (ref 8–27)
CO2: 24 mmol/L (ref 20–29)
Calcium: 8.8 mg/dL (ref 8.7–10.3)
Chloride: 99 mmol/L (ref 96–106)
Creatinine, Ser: 2.36 mg/dL — ABNORMAL HIGH (ref 0.57–1.00)
GFR calc Af Amer: 22 mL/min/{1.73_m2} — ABNORMAL LOW (ref >59)
GFR calc non Af Amer: 19 mL/min/{1.73_m2} — ABNORMAL LOW (ref >59)
Glucose: 160 mg/dL — ABNORMAL HIGH (ref 65–99)
Potassium: 4.5 mmol/L (ref 3.5–5.2)
Sodium: 141 mmol/L (ref 134–144)

## 2018-09-19 ENCOUNTER — Telehealth: Payer: Self-pay | Admitting: Cardiovascular Disease

## 2018-09-19 DIAGNOSIS — I25810 Atherosclerosis of coronary artery bypass graft(s) without angina pectoris: Secondary | ICD-10-CM

## 2018-09-19 NOTE — Telephone Encounter (Signed)
New Message          Patient's daughter is calling to see if Dr. Claiborne Billings is going to do her mother's "Calf".or not. Pls call and advise.

## 2018-09-19 NOTE — Telephone Encounter (Signed)
There was no documentation as to why the cath was cancelled - however, I suspect it was due to renal insufficiency if she is going to have another BMET. This would be up to Dr. Claiborne Billings. There is follow-up already scheduled with Leonia Reader, DNP, next week, who is also wondering why the cath was cancelled.  Dr. Lemmie Evens

## 2018-09-19 NOTE — Telephone Encounter (Signed)
Pts daughter called to report that she is wondering what the next step is based on the most recent BMET done yesterday. Advised her that I will forward to Dr. Debara Pickett and Dr. Claiborne Billings and will call her with their recommendations.

## 2018-09-20 ENCOUNTER — Other Ambulatory Visit: Payer: Self-pay | Admitting: Adult Health

## 2018-09-20 NOTE — Telephone Encounter (Signed)
Per Dr Claiborne Billings scheduled Cath 09-27-2018 @1PM  pt is to arrive @ [facility] @ 8am for pre-procedure  Hydration. Last day for Xarelto is Sunday 09-24-2018 and last dose of Demadex is Monday 09-25-2018. Pt is to have BMET Monday to make sure that Creatinine is still at baseline and ok to proceed with Cath 09-27-2018. Pt daughter has been notified of above and BMET is in [place], she will bring pt in on Monday for labs.

## 2018-09-20 NOTE — Progress Notes (Signed)
Orders written for cath for staged PCI of the circumflex. She is to have hydration prior cath and will be arriving at 6 am for cath at 1 pm.  Renal function has been checked on 09/18/2018. Creatinine 2.25 (down from 2.67).   Follow up labs and testing per Dr. Claiborne Billings after the procedure.

## 2018-09-20 NOTE — H&P (View-Only) (Signed)
Orders written for cath for staged PCI of the circumflex. She is to have hydration prior cath and will be arriving at 6 am for cath at 1 pm.  Renal function has been checked on 09/18/2018. Creatinine 2.25 (down from 2.67).   Follow up labs and testing per Dr. Claiborne Billings after the procedure.

## 2018-09-24 NOTE — Telephone Encounter (Signed)
agree

## 2018-09-25 ENCOUNTER — Ambulatory Visit: Payer: Medicare Other | Admitting: Cardiovascular Disease

## 2018-09-25 ENCOUNTER — Ambulatory Visit: Payer: Medicare Other | Admitting: Adult Health

## 2018-09-25 LAB — BASIC METABOLIC PANEL
BUN/Creatinine Ratio: 23 (ref 12–28)
BUN: 56 mg/dL — ABNORMAL HIGH (ref 8–27)
CO2: 24 mmol/L (ref 20–29)
Calcium: 9 mg/dL (ref 8.7–10.3)
Chloride: 97 mmol/L (ref 96–106)
Creatinine, Ser: 2.4 mg/dL — ABNORMAL HIGH (ref 0.57–1.00)
GFR calc Af Amer: 22 mL/min/{1.73_m2} — ABNORMAL LOW (ref >59)
GFR calc non Af Amer: 19 mL/min/{1.73_m2} — ABNORMAL LOW (ref >59)
Glucose: 225 mg/dL — ABNORMAL HIGH (ref 65–99)
Potassium: 5.2 mmol/L (ref 3.5–5.2)
Sodium: 140 mmol/L (ref 134–144)

## 2018-09-26 ENCOUNTER — Other Ambulatory Visit: Payer: Self-pay | Admitting: *Deleted

## 2018-09-26 ENCOUNTER — Telehealth: Payer: Self-pay | Admitting: *Deleted

## 2018-09-26 ENCOUNTER — Ambulatory Visit: Payer: Medicare Other | Admitting: Adult Health

## 2018-09-26 ENCOUNTER — Telehealth: Payer: Self-pay | Admitting: Cardiovascular Disease

## 2018-09-26 NOTE — Telephone Encounter (Addendum)
Pt contacted pre-PCI at [facility] for: Wednesday September 27, 2018 1 PM Verified arrival time and place: [place] Entrance A at: 8 AM  pre procedure hydration.  No solid food after midnight prior to cath, clear liquids until 5 AM day of procedure. Contrast allergy: no  Hold: Xarelto-last dose 09/24/18 Torsemide-last dose 09/25/18-[name] not sure, pt may have taken dose AM 09/26/18.  Insulin-AM of cath.  Except hold medications AM meds can be  taken pre-cath with sip of water including: ASA 81 mg  Plavix 75 mg  Confirm patient has responsible person to drive home post procedure and for 24 hours after you arrive home  LMTCB to discuss instructions with pt's daughter, [name].

## 2018-09-26 NOTE — Telephone Encounter (Signed)
I reviewed instructions with patient's daughter, [name], she verbalized understanding, thanked me for call.

## 2018-09-26 NOTE — Telephone Encounter (Signed)
Spoke with patient of Dr. Debara Pickett who is scheduled for [facility] tomorrow 12/3. She reports bleeding when she went to the bathroom and noticed bleeding like she was having a period. She states it was brownish-red in color. She noticed no clots. She could not tell if this was vaginal, urinary, rectal bleeding. She has been holding xarelto as instructed. She has no bleeding now. Advised to continue to monitor. CBC on arrival to hospital has been ordered.

## 2018-09-26 NOTE — Telephone Encounter (Signed)
New message   Patient is scheduled to have a cath tomorrow by Dr. Claiborne Billings. The patient states that she is having a bleeding issues. Wants to know if she can still have the surgery.

## 2018-09-27 ENCOUNTER — Encounter (HOSPITAL_COMMUNITY)
Admission: RE | Disposition: A | Discharge status: Home / Self Care | Payer: Self-pay | Source: Ambulatory Visit | Attending: Cardiovascular Disease

## 2018-09-27 ENCOUNTER — Ambulatory Visit (HOSPITAL_COMMUNITY)
Admission: RE | Admit: 2018-09-27 | Discharge: 2018-09-28 | Disposition: A | Discharge status: Home / Self Care | Payer: Medicare Other | Source: Ambulatory Visit | Attending: Cardiovascular Disease | Admitting: Cardiovascular Disease

## 2018-09-27 ENCOUNTER — Encounter (HOSPITAL_COMMUNITY): Payer: Self-pay | Admitting: General Practice

## 2018-09-27 ENCOUNTER — Other Ambulatory Visit: Payer: Self-pay

## 2018-09-27 DIAGNOSIS — K219 Gastro-esophageal reflux disease without esophagitis: Secondary | ICD-10-CM | POA: Insufficient documentation

## 2018-09-27 DIAGNOSIS — Z955 Presence of coronary angioplasty implant and graft: Secondary | ICD-10-CM

## 2018-09-27 DIAGNOSIS — Z823 Family history of stroke: Secondary | ICD-10-CM | POA: Insufficient documentation

## 2018-09-27 DIAGNOSIS — Z833 Family history of diabetes mellitus: Secondary | ICD-10-CM | POA: Insufficient documentation

## 2018-09-27 DIAGNOSIS — I480 Paroxysmal atrial fibrillation: Secondary | ICD-10-CM | POA: Insufficient documentation

## 2018-09-27 DIAGNOSIS — E039 Hypothyroidism, unspecified: Secondary | ICD-10-CM | POA: Diagnosis not present

## 2018-09-27 DIAGNOSIS — Z7902 Long term (current) use of antithrombotics/antiplatelets: Secondary | ICD-10-CM | POA: Insufficient documentation

## 2018-09-27 DIAGNOSIS — Z79899 Other long term (current) drug therapy: Secondary | ICD-10-CM | POA: Insufficient documentation

## 2018-09-27 DIAGNOSIS — I2510 Atherosclerotic heart disease of native coronary artery without angina pectoris: Secondary | ICD-10-CM | POA: Diagnosis present

## 2018-09-27 DIAGNOSIS — Z794 Long term (current) use of insulin: Secondary | ICD-10-CM | POA: Diagnosis not present

## 2018-09-27 DIAGNOSIS — Z882 Allergy status to sulfonamides status: Secondary | ICD-10-CM | POA: Diagnosis not present

## 2018-09-27 DIAGNOSIS — I4892 Unspecified atrial flutter: Secondary | ICD-10-CM | POA: Diagnosis present

## 2018-09-27 DIAGNOSIS — E1122 Type 2 diabetes mellitus with diabetic chronic kidney disease: Secondary | ICD-10-CM | POA: Insufficient documentation

## 2018-09-27 DIAGNOSIS — I130 Hypertensive heart and chronic kidney disease with heart failure and stage 1 through stage 4 chronic kidney disease, or unspecified chronic kidney disease: Secondary | ICD-10-CM | POA: Diagnosis not present

## 2018-09-27 DIAGNOSIS — Z9861 Coronary angioplasty status: Secondary | ICD-10-CM

## 2018-09-27 DIAGNOSIS — E785 Hyperlipidemia, unspecified: Secondary | ICD-10-CM | POA: Insufficient documentation

## 2018-09-27 DIAGNOSIS — Z8249 Family history of ischemic heart disease and other diseases of the circulatory system: Secondary | ICD-10-CM | POA: Insufficient documentation

## 2018-09-27 DIAGNOSIS — I5033 Acute on chronic diastolic (congestive) heart failure: Secondary | ICD-10-CM

## 2018-09-27 DIAGNOSIS — I10 Essential (primary) hypertension: Secondary | ICD-10-CM | POA: Diagnosis present

## 2018-09-27 DIAGNOSIS — E1169 Type 2 diabetes mellitus with other specified complication: Secondary | ICD-10-CM | POA: Diagnosis present

## 2018-09-27 DIAGNOSIS — I25700 Atherosclerosis of coronary artery bypass graft(s), unspecified, with unstable angina pectoris: Secondary | ICD-10-CM | POA: Diagnosis present

## 2018-09-27 DIAGNOSIS — IMO0001 Reserved for inherently not codable concepts without codable children: Secondary | ICD-10-CM

## 2018-09-27 DIAGNOSIS — Z951 Presence of aortocoronary bypass graft: Secondary | ICD-10-CM | POA: Insufficient documentation

## 2018-09-27 DIAGNOSIS — E1159 Type 2 diabetes mellitus with other circulatory complications: Secondary | ICD-10-CM | POA: Diagnosis present

## 2018-09-27 DIAGNOSIS — E119 Type 2 diabetes mellitus without complications: Secondary | ICD-10-CM

## 2018-09-27 DIAGNOSIS — I5032 Chronic diastolic (congestive) heart failure: Secondary | ICD-10-CM

## 2018-09-27 DIAGNOSIS — I25810 Atherosclerosis of coronary artery bypass graft(s) without angina pectoris: Secondary | ICD-10-CM | POA: Diagnosis not present

## 2018-09-27 DIAGNOSIS — Z888 Allergy status to other drugs, medicaments and biological substances status: Secondary | ICD-10-CM | POA: Insufficient documentation

## 2018-09-27 DIAGNOSIS — Z7901 Long term (current) use of anticoagulants: Secondary | ICD-10-CM | POA: Insufficient documentation

## 2018-09-27 DIAGNOSIS — N184 Chronic kidney disease, stage 4 (severe): Secondary | ICD-10-CM | POA: Diagnosis not present

## 2018-09-27 HISTORY — PX: CORONARY STENT INTERVENTION: CATH118234

## 2018-09-27 HISTORY — DX: Anemia, unspecified: D64.9

## 2018-09-27 HISTORY — DX: Anxiety disorder, unspecified: F41.9

## 2018-09-27 HISTORY — DX: Unspecified malignant neoplasm of skin, unspecified: C44.90

## 2018-09-27 HISTORY — DX: Atherosclerotic heart disease of native coronary artery without angina pectoris: I25.10

## 2018-09-27 HISTORY — DX: Presence of cardiac pacemaker: Z95.0

## 2018-09-27 HISTORY — DX: Type 2 diabetes mellitus without complications: E11.9

## 2018-09-27 LAB — CBC
HCT: 29.5 % — ABNORMAL LOW (ref 36.0–46.0)
Hemoglobin: 9 g/dL — ABNORMAL LOW (ref 12.0–15.0)
MCH: 29.8 pg (ref 26.0–34.0)
MCHC: 30.5 g/dL (ref 30.0–36.0)
MCV: 97.7 fL (ref 80.0–100.0)
Platelets: 229 10*3/uL (ref 150–400)
RBC: 3.02 MIL/uL — ABNORMAL LOW (ref 3.87–5.11)
RDW: 13.9 % (ref 11.5–15.5)
WBC: 7.5 10*3/uL (ref 4.0–10.5)
nRBC: 0 % (ref 0.0–0.2)

## 2018-09-27 LAB — BASIC METABOLIC PANEL
Anion gap: 13 (ref 5–15)
BUN: 60 mg/dL — ABNORMAL HIGH (ref 8–23)
CO2: 24 mmol/L (ref 22–32)
Calcium: 9 mg/dL (ref 8.9–10.3)
Chloride: 102 mmol/L (ref 98–111)
Creatinine, Ser: 2.21 mg/dL — ABNORMAL HIGH (ref 0.44–1.00)
GFR calc Af Amer: 24 mL/min — ABNORMAL LOW (ref >60)
GFR calc non Af Amer: 21 mL/min — ABNORMAL LOW (ref >60)
Glucose, Bld: 119 mg/dL — ABNORMAL HIGH (ref 70–99)
Potassium: 4.2 mmol/L (ref 3.5–5.1)
Sodium: 139 mmol/L (ref 135–145)

## 2018-09-27 LAB — GLUCOSE, CAPILLARY
Glucose-Capillary: 111 mg/dL — ABNORMAL HIGH (ref 70–99)
Glucose-Capillary: 86 mg/dL (ref 70–99)
Glucose-Capillary: 77 mg/dL (ref 70–99)

## 2018-09-27 LAB — POCT ACTIVATED CLOTTING TIME
Activated Clotting Time: 527 seconds
Activated Clotting Time: 213 seconds
Activated Clotting Time: 186 seconds

## 2018-09-27 SURGERY — CORONARY STENT INTERVENTION
Anesthesia: LOCAL

## 2018-09-27 MED ORDER — SODIUM CHLORIDE 0.9% FLUSH: 3.0000 mL | Freq: Two times a day (BID) | INTRAVENOUS | Status: DC

## 2018-09-27 MED ORDER — HEPARIN (PORCINE) IN NACL 1000-0.9 UT/500ML-% IV SOLN
INTRAVENOUS | Status: AC
  Filled 2018-09-27: qty 1000

## 2018-09-27 MED ORDER — CLOPIDOGREL BISULFATE 75 MG PO TABS
75.0000 mg | ORAL_TABLET | Freq: Every day | ORAL | Status: DC
  Administered 2018-09-28: 10:00:00 75 mg via ORAL
  Filled 2018-09-27: qty 1

## 2018-09-27 MED ORDER — BIVALIRUDIN TRIFLUOROACETATE 250 MG IV SOLR
INTRAVENOUS | Status: AC
  Filled 2018-09-27: qty 250

## 2018-09-27 MED ORDER — NITROGLYCERIN 1 MG/10 ML FOR IR/CATH LAB
INTRA_ARTERIAL | Status: AC
  Filled 2018-09-27: qty 10

## 2018-09-27 MED ORDER — ASPIRIN 81 MG PO CHEW: 81.0000 mg | CHEWABLE_TABLET | ORAL | Status: DC

## 2018-09-27 MED ORDER — SODIUM CHLORIDE 0.9% FLUSH
3.0000 mL | Freq: Two times a day (BID) | INTRAVENOUS | Status: DC
  Administered 2018-09-27: 18:00:00 3 mL via INTRAVENOUS

## 2018-09-27 MED ORDER — ACETAMINOPHEN 325 MG PO TABS: 650.0000 mg | ORAL_TABLET | ORAL | Status: DC | PRN

## 2018-09-27 MED ORDER — SODIUM CHLORIDE 0.9 % IV SOLN
INTRAVENOUS | Status: DC
  Administered 2018-09-27: 20:00:00 via INTRAVENOUS

## 2018-09-27 MED ORDER — CLOPIDOGREL BISULFATE 75 MG PO TABS
ORAL_TABLET | ORAL | Status: AC
  Filled 2018-09-27: qty 1

## 2018-09-27 MED ORDER — LIDOCAINE HCL (PF) 1 % IJ SOLN
INTRAMUSCULAR | Status: AC
  Filled 2018-09-27: qty 30

## 2018-09-27 MED ORDER — RIVAROXABAN 15 MG PO TABS
15.0000 mg | ORAL_TABLET | Freq: Every day | ORAL | Status: DC
  Administered 2018-09-28: 15 mg via ORAL
  Filled 2018-09-27: qty 1

## 2018-09-27 MED ORDER — HYDRALAZINE HCL 20 MG/ML IJ SOLN: 5.0000 mg | INTRAMUSCULAR | Status: AC | PRN

## 2018-09-27 MED ORDER — MIDAZOLAM HCL 2 MG/2ML IJ SOLN
INTRAMUSCULAR | Status: DC | PRN
  Administered 2018-09-27: 1 mg via INTRAVENOUS

## 2018-09-27 MED ORDER — LIDOCAINE HCL (PF) 1 % IJ SOLN
INTRAMUSCULAR | Status: DC | PRN
  Administered 2018-09-27: 15 mL

## 2018-09-27 MED ORDER — CLOPIDOGREL BISULFATE 300 MG PO TABS
ORAL_TABLET | ORAL | Status: DC | PRN
  Administered 2018-09-27: 150 mg via ORAL

## 2018-09-27 MED ORDER — SODIUM CHLORIDE 0.9% FLUSH: 3.0000 mL | INTRAVENOUS | Status: DC | PRN

## 2018-09-27 MED ORDER — FENTANYL CITRATE (PF) 100 MCG/2ML IJ SOLN
INTRAMUSCULAR | Status: AC
  Filled 2018-09-27: qty 2

## 2018-09-27 MED ORDER — SODIUM CHLORIDE 0.9 % WEIGHT BASED INFUSION: 1.0000 mL/kg/h | INTRAVENOUS | Status: DC

## 2018-09-27 MED ORDER — SODIUM CHLORIDE 0.9 % IV SOLN: 250.0000 mL | INTRAVENOUS | Status: DC | PRN

## 2018-09-27 MED ORDER — LABETALOL HCL 5 MG/ML IV SOLN: 10.0000 mg | INTRAVENOUS | Status: AC | PRN

## 2018-09-27 MED ORDER — BIVALIRUDIN BOLUS VIA INFUSION - CUPID
INTRAVENOUS | Status: DC | PRN
  Administered 2018-09-27: 49.95 mg via INTRAVENOUS

## 2018-09-27 MED ORDER — NITROGLYCERIN 1 MG/10 ML FOR IR/CATH LAB
INTRA_ARTERIAL | Status: DC | PRN
  Administered 2018-09-27 (×2): 200 ug via INTRACORONARY

## 2018-09-27 MED ORDER — ONDANSETRON HCL 4 MG/2ML IJ SOLN
4.0000 mg | Freq: Four times a day (QID) | INTRAMUSCULAR | Status: DC | PRN
  Filled 2018-09-27: qty 2

## 2018-09-27 MED ORDER — SODIUM CHLORIDE 0.9 % WEIGHT BASED INFUSION
3.0000 mL/kg/h | INTRAVENOUS | Status: DC
  Administered 2018-09-27: 3 mL/kg/h via INTRAVENOUS

## 2018-09-27 MED ORDER — HEPARIN (PORCINE) IN NACL 1000-0.9 UT/500ML-% IV SOLN
INTRAVENOUS | Status: DC | PRN
  Administered 2018-09-27: 500 mL

## 2018-09-27 MED ORDER — FENTANYL CITRATE (PF) 100 MCG/2ML IJ SOLN
INTRAMUSCULAR | Status: DC | PRN
  Administered 2018-09-27: 25 ug via INTRAVENOUS

## 2018-09-27 MED ORDER — SODIUM CHLORIDE 0.9 % IV SOLN
INTRAVENOUS | Status: AC | PRN
  Administered 2018-09-27: 1 mg/kg/h via INTRAVENOUS

## 2018-09-27 MED ORDER — ASPIRIN 81 MG PO CHEW
81.0000 mg | CHEWABLE_TABLET | Freq: Every day | ORAL | Status: DC
  Administered 2018-09-28: 81 mg via ORAL
  Filled 2018-09-27: qty 1

## 2018-09-27 MED ORDER — MIDAZOLAM HCL 2 MG/2ML IJ SOLN
INTRAMUSCULAR | Status: AC
  Filled 2018-09-27: qty 2

## 2018-09-27 MED ORDER — DIAZEPAM 5 MG PO TABS: 5.0000 mg | ORAL_TABLET | Freq: Four times a day (QID) | ORAL | Status: DC | PRN

## 2018-09-27 MED ORDER — IOHEXOL 350 MG/ML SOLN
INTRAVENOUS | Status: DC | PRN
  Administered 2018-09-27: 75 mL via INTRA_ARTERIAL

## 2018-09-27 MED ORDER — ANGIOPLASTY BOOK
Freq: Once | Status: DC
  Filled 2018-09-27: qty 1

## 2018-09-27 SURGICAL SUPPLY — 12 items
BALLN SAPPHIRE 2.0X10 (BALLOONS) ×2
BALLOON SAPPHIRE 2.0X10 (BALLOONS) ×1 IMPLANT
CATHETER LAUNCHER 6FR LCB (CATHETERS) ×2 IMPLANT
KIT ENCORE 26 ADVANTAGE (KITS) ×2 IMPLANT
KIT HEART LEFT (KITS) ×2 IMPLANT
PACK CARDIAC CATHETERIZATION (CUSTOM PROCEDURE TRAY) ×2 IMPLANT
SHEATH PINNACLE 6F 10CM (SHEATH) ×2 IMPLANT
STENT SYNERGY DES 2.25X12 (Permanent Stent) ×4 IMPLANT
TRANSDUCER W/STOPCOCK (MISCELLANEOUS) ×2 IMPLANT
TUBING CIL FLEX 10 FLL-RA (TUBING) ×2 IMPLANT
WIRE ASAHI PROWATER 180CM (WIRE) ×2 IMPLANT
WIRE EMERALD 3MM-J .035X150CM (WIRE) ×2 IMPLANT

## 2018-09-27 NOTE — Progress Notes (Signed)
Site area: right groin  Site Prior to Removal:  Level 1  Pressure Applied For 25 MINUTES    Minutes Beginning at 22:34  Manual:   Yes.    Patient Status During Pull:  WNL  Post Pull Groin Site:  Level 0  Post Pull Instructions Given:  Yes.    Post Pull Pulses Present:  Yes.    Dressing Applied:  Yes.    Comments:  Pt tolerated well.

## 2018-09-27 NOTE — Interval H&P Note (Signed)
Cath Lab Visit (complete for each Cath Lab visit)  Clinical Evaluation Leading to the Procedure:   ACS: No.  Non-ACS:    Anginal Classification: CCS III  Anti-ischemic medical therapy: Maximal Therapy (2 or more classes of medications)  Non-Invasive Test Results: No non-invasive testing performed  Prior CABG: Previous CABG      History and Physical Interval Note:  09/27/2018 3:27 PM  [name]  has presented today for surgery, with the diagnosis of cad  The various methods of treatment have been discussed with the patient and family. After consideration of risks, benefits and other options for treatment, the patient has consented to  Procedure(s): CORONARY STENT INTERVENTION (N/A) as a surgical intervention .  The patient's history has been reviewed, patient examined, no change in status, stable for surgery.  I have reviewed the patient's chart and labs.  Questions were answered to the patient's satisfaction.     Shelva Majestic

## 2018-09-28 ENCOUNTER — Telehealth: Payer: Self-pay | Admitting: Internal Medicine

## 2018-09-28 ENCOUNTER — Encounter (HOSPITAL_COMMUNITY): Payer: Self-pay | Admitting: Cardiovascular Disease

## 2018-09-28 DIAGNOSIS — I25700 Atherosclerosis of coronary artery bypass graft(s), unspecified, with unstable angina pectoris: Secondary | ICD-10-CM | POA: Diagnosis not present

## 2018-09-28 DIAGNOSIS — Z955 Presence of coronary angioplasty implant and graft: Secondary | ICD-10-CM | POA: Diagnosis not present

## 2018-09-28 DIAGNOSIS — I5032 Chronic diastolic (congestive) heart failure: Secondary | ICD-10-CM | POA: Diagnosis not present

## 2018-09-28 DIAGNOSIS — E1122 Type 2 diabetes mellitus with diabetic chronic kidney disease: Secondary | ICD-10-CM | POA: Diagnosis not present

## 2018-09-28 DIAGNOSIS — I25810 Atherosclerosis of coronary artery bypass graft(s) without angina pectoris: Secondary | ICD-10-CM | POA: Diagnosis not present

## 2018-09-28 DIAGNOSIS — I130 Hypertensive heart and chronic kidney disease with heart failure and stage 1 through stage 4 chronic kidney disease, or unspecified chronic kidney disease: Secondary | ICD-10-CM | POA: Diagnosis not present

## 2018-09-28 LAB — CBC
HCT: 27 % — ABNORMAL LOW (ref 36.0–46.0)
Hemoglobin: 8.2 g/dL — ABNORMAL LOW (ref 12.0–15.0)
MCH: 29.8 pg (ref 26.0–34.0)
MCHC: 30.4 g/dL (ref 30.0–36.0)
MCV: 98.2 fL (ref 80.0–100.0)
Platelets: 214 10*3/uL (ref 150–400)
RBC: 2.75 MIL/uL — ABNORMAL LOW (ref 3.87–5.11)
RDW: 14.4 % (ref 11.5–15.5)
WBC: 6.8 10*3/uL (ref 4.0–10.5)
nRBC: 0 % (ref 0.0–0.2)

## 2018-09-28 LAB — BASIC METABOLIC PANEL
Anion gap: 11 (ref 5–15)
BUN: 46 mg/dL — ABNORMAL HIGH (ref 8–23)
CO2: 23 mmol/L (ref 22–32)
Calcium: 8.3 mg/dL — ABNORMAL LOW (ref 8.9–10.3)
Chloride: 110 mmol/L (ref 98–111)
Creatinine, Ser: 1.83 mg/dL — ABNORMAL HIGH (ref 0.44–1.00)
GFR calc Af Amer: 30 mL/min — ABNORMAL LOW (ref >60)
GFR calc non Af Amer: 26 mL/min — ABNORMAL LOW (ref >60)
Glucose, Bld: 137 mg/dL — ABNORMAL HIGH (ref 70–99)
Potassium: 4.4 mmol/L (ref 3.5–5.1)
Sodium: 144 mmol/L (ref 135–145)

## 2018-09-28 LAB — GLUCOSE, CAPILLARY: Glucose-Capillary: 115 mg/dL — ABNORMAL HIGH (ref 70–99)

## 2018-09-28 LAB — LIPID PANEL
Cholesterol: 203 mg/dL — ABNORMAL HIGH (ref 0–200)
HDL: 25 mg/dL — ABNORMAL LOW (ref >40)
LDL Cholesterol: 114 mg/dL — ABNORMAL HIGH (ref 0–99)
Total CHOL/HDL Ratio: 8.1 RATIO
Triglycerides: 320 mg/dL — ABNORMAL HIGH (ref <150)
VLDL: 64 mg/dL — ABNORMAL HIGH (ref 0–40)

## 2018-09-28 MED ORDER — ASPIRIN EC 81 MG PO TBEC: 81.0000 mg | DELAYED_RELEASE_TABLET | Freq: Every day | ORAL | Status: DC

## 2018-09-28 NOTE — Discharge Summary (Signed)
Discharge Summary    Patient ID: [name],  MRN: [id number], DOB/AGE: [birth year] 78 y.o.  Admit date: 09/27/2018 Discharge date: 09/28/2018  Primary Care Provider: Leeroy Cha Primary Cardiologist: [name], MD  Discharge Diagnoses    Principal Problem:   CAD (coronary artery disease) of bypass graft Active Problems:   Diabetes mellitus, type 2 ([place])   Hyperlipidemia LDL goal <70   Essential hypertension   CKD (chronic kidney disease) stage 4, GFR 15-29 ml/min (HCC)   Paroxysmal atrial flutter (HCC)   Chronic diastolic CHF (congestive heart failure) (HCC)   Allergies Allergies  Allergen Reactions  . Clonidine Derivatives Other (See Comments)    Bradycardia and fatigue   . Sulfa Antibiotics Other (See Comments)    Unknown  . Crestor [Rosuvastatin Calcium] Other (See Comments)    Other reaction(s): tired and weak  . Epinephrine Other (See Comments)    Abnormal feeling. Dental exam/injection of local w/ epi.  Marland Kitchen Hydralazine Other (See Comments)    Nausea/gi upset   . Losartan Other (See Comments)    Hyperkalemia   . Other Other (See Comments)    MANGO'S - WELTS ALL OVER    Diagnostic Studies/Procedures    Coronary stent intervention 09/27/18: Prior to intervention, the saphenous graft supplying the circumflex marginal vessel had a 99% ostial/proximal stenosis with thrombus, followed by 70% proximal stenosis on a bend in the vessel.  There was 30% mild irregularity in the midportion of the graft with mild ectasia in the distal aspect of the graft prior to insertion into the marginal vessel.  Following successful intervention with PTCA and insertion of synergy 2.25 x 12 mm stents both ostially and proximally, both lesions were reduced to 0%.  There was brisk TIMI-3 flow.  There was no evidence for dissection.  RECOMMENDATION: Continued medical therapy for concomitant CAD.  Recommend resumption of Xarelto tomorrow with plans for triple drug  therapy for 1 month and then change to Plavix/Xarelto for at least 6 months with then resumption of aspirin/Xarelto. _____________   History of Present Illness     77 y.o. female who presents for ongoing assessment and management of CAD with hx of CABG in 2013, LIMA to LAD, SVG to OM, SVG to RCA, chronic diastolic congestive CHF, PPM in situ for ([employer]) in the setting of progressive symptomatic bradycardia. She underwent LHC 09/06/18 and was found to have high-grade graft disease with 95% near ostial stenosis in the graft supplying the marginal vessel and 90% focal stenosis in a small caliber mid-distal LAD beyond the LIMA. Given her CKD, she was recommended for staged PCI.   Hospital Course     Consultants: None   1. CAD s/p CABG with subsequent vein graft stenosis: patient presented for staged intervention 09/27/18 with prehydration given baseline CKD. Cardiac catheterization with successful PCI/DES to ostial/proximal lesion in SVG to Cx marginal vessel. She was recommended for triple therapy with ASA x1 month, then continue xarelto and plavix for at least 6 months. Cr post cath 1.83 (baseline ~2.3).  - Continue aspirin x1 month - Continue plavix for at least 6 months - Continue statin - Continue amlodipine and carvedilol  2. Chronic diastolic CHF: patient appeared euvolemic post catheterization.  - Resume torsemide 09/29/18 - Encouraged to continue limiting salt intake and monitoring her weights daily  3. Paroxysmal atrial fibrillation: maintained sinus rhythm this admission.  - Continue carvedilol for rate control - Continue xarelto for stroke ppx  4. HTN: BP  stable - Continue home carvedilol, amlodipine, and torsemide  5. HLD: no recent lipids. Prior intolerance to statins - Consider referral to the lipid clinic for PSK-9 inhibitor consideration  6. DM type 2:  - Home glycemic regimen continued at discharge.    _____________  Discharge Vitals Blood pressure (!) 135/46,  pulse 61, temperature 98.4 F (36.9 C), temperature source Oral, resp. rate 14, weight 66 kg, SpO2 95 %.  Filed Weights   09/28/18 0541  Weight: 66 kg   Physical exam on the day of discharge:  GEN: Sitting on the edge of her hospital bed in no acute distress.   Neck: No JVD, no carotid bruits Cardiac: RRR, no murmurs, rubs, or gallops. Right groin cath site without hematoma or ecchymosis.  Respiratory: Clear to auscultation bilaterally, no wheezes/ rales/ rhonchi GI: NABS, Soft, nontender, non-distended  MS: No edema; No deformity. Neuro:  Nonfocal, moving all extremities spontaneously Psych: Normal affect    Labs & Radiologic Studies    CBC Recent Labs    09/27/18 0852 09/28/18 0437  WBC 7.5 6.8  HGB 9.0* 8.2*  HCT 29.5* 27.0*  MCV 97.7 98.2  PLT 229 109   Basic Metabolic Panel Recent Labs    09/27/18 0852 09/28/18 0437  NA 139 144  K 4.2 4.4  CL 102 110  CO2 24 23  GLUCOSE 119* 137*  BUN 60* 46*  CREATININE 2.21* 1.83*  CALCIUM 9.0 8.3*   Liver Function Tests No results for input(s): AST, ALT, ALKPHOS, BILITOT, PROT, ALBUMIN in the last 72 hours. No results for input(s): LIPASE, AMYLASE in the last 72 hours. Cardiac Enzymes No results for input(s): CKTOTAL, CKMB, CKMBINDEX, TROPONINI in the last 72 hours. BNP Invalid input(s): POCBNP D-Dimer No results for input(s): DDIMER in the last 72 hours. Hemoglobin A1C No results for input(s): HGBA1C in the last 72 hours. Fasting Lipid Panel No results for input(s): CHOL, HDL, LDLCALC, TRIG, CHOLHDL, LDLDIRECT in the last 72 hours. Thyroid Function Tests No results for input(s): TSH, T4TOTAL, T3FREE, THYROIDAB in the last 72 hours.  Invalid input(s): FREET3 _____________  No results found. Disposition   Patient was seen and examined by Dr. Debara Pickett who deemed patient as stable for discharge. Follow-up has been arranged. Discharge medications as listed below.   Follow-up Plans & Appointments     Discharge  Instructions    Amb Referral to Cardiac Rehabilitation   Complete by:  As directed    Will send Cardiac Rehab Phase II referral to [facility]   Diagnosis:  Coronary Stents      Discharge Medications   Allergies as of 09/28/2018      Reactions   Clonidine Derivatives Other (See Comments)   Bradycardia and fatigue   Sulfa Antibiotics Other (See Comments)   Unknown   Crestor [rosuvastatin Calcium] Other (See Comments)   Other reaction(s): tired and weak   Epinephrine Other (See Comments)   Abnormal feeling. Dental exam/injection of local w/ epi.   Hydralazine Other (See Comments)   Nausea/gi upset   Losartan Other (See Comments)   Hyperkalemia   Other Other (See Comments)   MANGO'S - WELTS ALL OVER      Medication List    TAKE these medications   amLODipine 5 MG tablet Commonly known as:  NORVASC Take 1 tablet (5 mg total) by mouth daily. What changed:  how much to take   aspirin EC 81 MG tablet Take 1 tablet (81 mg total) by mouth daily. STOP THIS MEDICATION 10/29/2018  What changed:  additional instructions   carvedilol 6.25 MG tablet Commonly known as:  COREG TAKE 1 TABLET BY MOUTH TWICE DAILY   clopidogrel 75 MG tablet Commonly known as:  PLAVIX Take 1 tablet (75 mg total) by mouth daily.   docusate sodium 100 MG capsule Commonly known as:  COLACE Take 100 mg by mouth 2 (two) times daily as needed for mild constipation or moderate constipation.   esomeprazole 20 MG capsule Commonly known as:  NEXIUM TAKE 1 CAPSULE BY MOUTH ONCE DAILY AT  12  NOON What changed:  See the new instructions.   ferrous sulfate 325 (65 FE) MG EC tablet Take 325 mg by mouth daily with breakfast.   gabapentin 300 MG capsule Commonly known as:  NEURONTIN Take 300 mg by mouth 2 (two) times daily.   LANTUS SOLOSTAR 100 UNIT/ML Solostar Pen Generic drug:  Insulin Glargine Inject 22 Units into the skin daily.   meclizine 25 MG tablet Commonly known as:  ANTIVERT Take 25 mg by mouth  daily as needed for dizziness. For dizziness   nitroGLYCERIN 0.4 MG SL tablet Commonly known as:  NITROSTAT Place 1 tablet (0.4 mg total) under the tongue every 5 (five) minutes as needed for chest pain.   PRESERVISION AREDS 2 PO Take 1 tablet by mouth every evening.   SYNTHROID 88 MCG tablet Generic drug:  levothyroxine Take 88 mcg by mouth daily before breakfast.   SYSTANE OP Place 1 drop into both eyes 2 (two) times daily.   torsemide 20 MG tablet Commonly known as:  DEMADEX Take 40 mg by mouth every morning.   VITAMIN B-12 PO Take 1 tablet by mouth 3 (three) times a week.   VITAMIN C PO Take 1 tablet by mouth 3 (three) times a week.   XARELTO 15 MG Tabs tablet Generic drug:  Rivaroxaban TAKE 1 TABLET BY MOUTH ONCE DAILY WITH SUPPER What changed:  See the new instructions.        Aspirin prescribed at discharge?  Yes High Intensity Statin Prescribed? (Lipitor 40-80mg  or Crestor 20-40mg ): No: prior intolerance Beta Blocker Prescribed? Yes For EF <40%, was ACEI/ARB Prescribed? No: CKD ADP Receptor Inhibitor Prescribed? (i.e. Plavix etc.-Includes Medically Managed Patients): Yes For EF <40%, Aldosterone Inhibitor Prescribed? No: EF >40% Was EF assessed during THIS hospitalization? No Was Cardiac Rehab II ordered? (Included Medically managed Patients): Yes   Outstanding Labs/Studies   Continue to monitor Cr closely outpatient  Duration of Discharge Encounter   Greater than 30 minutes including physician time.  Signed, Abigail Butts PA-C 09/28/2018, 9:13 AM

## 2018-09-28 NOTE — Progress Notes (Signed)
CARDIAC REHAB PHASE I   PRE:  Rate/Rhythm: 61 paced  BP:  Sitting: 135/46      SaO2: 95 RA  MODE:  Ambulation: 250 ft   POST:  Rate/Rhythm: 79 paced  BP:  Sitting: 128/52    SaO2: 97 RA  Pt ambulated 287ft in hallway assist of one with gait belt and front wheel walker. Pt c/o slight SOB, but denies CP. Pt c/o "discomfort" on pacemaker site, RN made aware. Pt and daughter educated on importance of ASA, Plavix, Xerelto, and NTG. Stent card at bedside. Pt given heart healthy diet, and CHF booklet. Reviewed restrictions and exercise guidelines. Strongly encouraged safety, especially in setting of recent fall. Will refer to CRP II [place], pt unsure if she will be able to attend due to transportation and cost.   [id number] Rufina Falco, RN BSN 09/28/2018 9:13 AM

## 2018-09-28 NOTE — Telephone Encounter (Signed)
Currently admitted.

## 2018-09-28 NOTE — Discharge Instructions (Signed)
PLEASE REMEMBER TO BRING ALL OF YOUR MEDICATIONS TO EACH OF YOUR FOLLOW-UP OFFICE VISITS.  PLEASE ATTEND ALL SCHEDULED FOLLOW-UP APPOINTMENTS.   Activity: Increase activity slowly as tolerated. You may shower, but no soaking baths (or swimming) for 1 week. No driving for 24 hours. No lifting over 5 lbs for 1 week. No sexual activity for 1 week.   Wound Care: You may wash cath site gently with soap and water. Keep cath site clean and dry. If you notice pain, swelling, bleeding or pus at your cath site, please call [phone]. _____________________________________________  [name] can restart your torsemide 09/29/18 as previously prescribed. Continue to limit your salt intake to 2000mg  per day. Monitor your weight daily and notify the office if you gain more than 3lbs in 1 day or 5lbs in 1 week. This may be a sign that you are retaining fluid and may need an adjustment to your torsemide dosing _____________________________________________  Continue taking aspirin and plavix for the next month. You may stop taking aspirin 10/29/2018 but you will continue to take plavix and xarelto going forward for at least 6 months.  _____________________________________________

## 2018-09-28 NOTE — Telephone Encounter (Signed)
  TOC appt per Daleen Snook Kroeger 10/09/18 8:30 am with Kerin Ransom

## 2018-09-29 NOTE — Telephone Encounter (Signed)
TOC attempt #1-unable to leave VM (VM box full)

## 2018-10-02 NOTE — Telephone Encounter (Signed)
TOC attempt #2. Left message to call back to discuss.  Left call back number.

## 2018-10-03 NOTE — Telephone Encounter (Signed)
Patient contacted regarding discharge from [facility] on 09/28/18.  Patient understands to follow up with provider KILROY on 10/09/18 at 8:30 AM at [facility]. Patient understands discharge instructions? yes  Patient understands medications and regiment? yes  Patient understands to bring all medications to this visit? yes

## 2018-10-09 ENCOUNTER — Ambulatory Visit (INDEPENDENT_AMBULATORY_CARE_PROVIDER_SITE_OTHER): Payer: Medicare Other | Admitting: Cardiology

## 2018-10-09 ENCOUNTER — Encounter: Payer: Self-pay | Admitting: Cardiology

## 2018-10-09 VITALS — BP 110/78 | HR 63 | Ht 60.0 in | Wt 141.6 lb

## 2018-10-09 DIAGNOSIS — D649 Anemia, unspecified: Secondary | ICD-10-CM

## 2018-10-09 DIAGNOSIS — N184 Chronic kidney disease, stage 4 (severe): Secondary | ICD-10-CM | POA: Diagnosis not present

## 2018-10-09 DIAGNOSIS — E785 Hyperlipidemia, unspecified: Secondary | ICD-10-CM

## 2018-10-09 DIAGNOSIS — Z9861 Coronary angioplasty status: Secondary | ICD-10-CM

## 2018-10-09 DIAGNOSIS — I2510 Atherosclerotic heart disease of native coronary artery without angina pectoris: Secondary | ICD-10-CM | POA: Diagnosis not present

## 2018-10-09 DIAGNOSIS — I10 Essential (primary) hypertension: Secondary | ICD-10-CM

## 2018-10-09 DIAGNOSIS — I214 Non-ST elevation (NSTEMI) myocardial infarction: Secondary | ICD-10-CM | POA: Diagnosis not present

## 2018-10-09 DIAGNOSIS — D638 Anemia in other chronic diseases classified elsewhere: Secondary | ICD-10-CM

## 2018-10-09 LAB — BASIC METABOLIC PANEL
BUN/Creatinine Ratio: 24 (ref 12–28)
BUN: 62 mg/dL — ABNORMAL HIGH (ref 8–27)
CO2: 23 mmol/L (ref 20–29)
Calcium: 8.9 mg/dL (ref 8.7–10.3)
Chloride: 98 mmol/L (ref 96–106)
Creatinine, Ser: 2.55 mg/dL — ABNORMAL HIGH (ref 0.57–1.00)
GFR calc Af Amer: 20 mL/min/{1.73_m2} — ABNORMAL LOW (ref >59)
GFR calc non Af Amer: 17 mL/min/{1.73_m2} — ABNORMAL LOW (ref >59)
Glucose: 181 mg/dL — ABNORMAL HIGH (ref 65–99)
Potassium: 4.9 mmol/L (ref 3.5–5.2)
Sodium: 139 mmol/L (ref 134–144)

## 2018-10-09 LAB — CBC
Hematocrit: 28 % — ABNORMAL LOW (ref 34.0–46.6)
Hemoglobin: 9.2 g/dL — ABNORMAL LOW (ref 11.1–15.9)
MCH: 30.9 pg (ref 26.6–33.0)
MCHC: 32.9 g/dL (ref 31.5–35.7)
MCV: 94 fL (ref 79–97)
Platelets: 258 10*3/uL (ref 150–450)
RBC: 2.98 x10E6/uL — ABNORMAL LOW (ref 3.77–5.28)
RDW: 14 % (ref 12.3–15.4)
WBC: 5.9 10*3/uL (ref 3.4–10.8)

## 2018-10-09 NOTE — Progress Notes (Signed)
10/09/2018 [name]   10-Nov-1940  [id number]  Primary Physician Leeroy Cha, MD Primary Cardiologist: Dr Debara Pickett  HPI: [name] is a delightful 77 year old female followed by Dr. Debara Pickett.  Her daughter is a Marine scientist and women's hospital.  The patient has a history of coronary disease and had bypass grafting in February 2013 with an LIMA to the LAD, vein graft to the OM, and vein graft to the RCA.  She has PAF and sick sinus syndrome and had a [employer] pacemaker implanted in the past.  She is on chronic Xarelto.  Other problems include treated hypertension, chronic renal insufficiency stage III-IV followed by Dr. Moshe Cipro, chronic anemia, and untreated dyslipidemia.  The patient developed dyspnea on exertion and substernal chest pain in November.  She had a Myoview which was abnormal.  She underwent diagnostic catheterization September 06, 2018 which revealed a patent graft to her RCA, patent LIMA to the LAD with a 90% distal LAD after the graft insertion, and a 95% proximal vein graft to OM bypass.  Because of her renal insufficiency she was discharged and then brought back on September 27, 2018 for a staged intervention to the vein graft to the OM.  She tolerated this well.  At discharge her creatinine was 1.83, her hemoglobin is 8.2.  She is in the office today for follow-up.  Since discharge she continues to have some vague dyspnea but it gets better with rest.  Her overall complaints are fatigue.  She is been resistant to medications in the past.  She is actually not taking her amlodipine which was added in the hospital.  She has been prescribed a statin by her primary care provider but she is not taking this either.  Her blood pressure in the office today is 110/70.  Her daughter accompanied her to this visit.   Current Outpatient Medications  Medication Sig Dispense Refill  . Ascorbic Acid (VITAMIN C PO) Take 1 tablet by mouth 3 (three) times a week.    Marland Kitchen aspirin EC 81 MG  tablet Take 1 tablet (81 mg total) by mouth daily. STOP THIS MEDICATION 10/29/2018    . carvedilol (COREG) 6.25 MG tablet TAKE 1 TABLET BY MOUTH TWICE DAILY 60 tablet 11  . clopidogrel (PLAVIX) 75 MG tablet Take 1 tablet (75 mg total) by mouth daily. 90 tablet 4  . Cyanocobalamin (VITAMIN B-12 PO) Take 1 tablet by mouth 3 (three) times a week.     . docusate sodium (COLACE) 100 MG capsule Take 100 mg by mouth 2 (two) times daily as needed for mild constipation or moderate constipation.    Marland Kitchen esomeprazole (NEXIUM) 20 MG capsule TAKE 1 CAPSULE BY MOUTH ONCE DAILY AT  12  NOON 90 capsule 1  . ferrous sulfate 325 (65 FE) MG EC tablet Take 325 mg by mouth daily with breakfast.    . gabapentin (NEURONTIN) 300 MG capsule Take 300 mg by mouth 2 (two) times daily.     Marland Kitchen LANTUS SOLOSTAR 100 UNIT/ML Solostar Pen Inject 22 Units into the skin daily.  6  . levothyroxine (SYNTHROID) 88 MCG tablet Take 88 mcg by mouth daily before breakfast.     . meclizine (ANTIVERT) 25 MG tablet Take 25 mg by mouth daily as needed for dizziness. For dizziness    . Multiple Vitamins-Minerals (PRESERVISION AREDS 2 PO) Take 1 tablet by mouth every evening.     . nitroGLYCERIN (NITROSTAT) 0.4 MG SL tablet Place 1 tablet (0.4 mg total) under  the tongue every 5 (five) minutes as needed for chest pain. 25 tablet 3  . Polyethyl Glycol-Propyl Glycol (SYSTANE OP) Place 1 drop into both eyes 2 (two) times daily.     Marland Kitchen torsemide (DEMADEX) 20 MG tablet Take 40 mg by mouth every morning.    Alveda Reasons 15 MG TABS tablet TAKE 1 TABLET BY MOUTH ONCE DAILY WITH SUPPER 90 tablet 1   No current facility-administered medications for this visit.     Allergies  Allergen Reactions  . Clonidine Derivatives Other (See Comments)    Bradycardia and fatigue   . Sulfa Antibiotics Other (See Comments)    Unknown  . Crestor [Rosuvastatin Calcium] Other (See Comments)    Other reaction(s): tired and weak  . Epinephrine Other (See Comments)    Abnormal  feeling. Dental exam/injection of local w/ epi.  Marland Kitchen Hydralazine Other (See Comments)    Nausea/gi upset   . Losartan Other (See Comments)    Hyperkalemia   . Other Other (See Comments)    MANGO'S - WELTS ALL OVER    Past Medical History:  Diagnosis Date  . Anemia   . Anxiety   . Arthritis    "in my hands; knees, back" (09/27/2018)  . CAD (coronary artery disease)    a. 40-59% bilaterally 10/2015.  Marland Kitchen Chronic diastolic CHF (congestive heart failure) ([facility])    a. 05/2016 Echo: EF 60-65%, no rwma, Gr1 DD, Ao sclerosis w/o stenosis, triv MR;  b. 07/2016 TEE: EF 55-60%, no rwma, mild MR.  . CKD (chronic kidney disease), stage III ([facility])   . Coronary artery disease    a. 02/2007 Persantine MV: low risk;  b. 11/2011 CABG x 3 (LIMA->LAD, VG->OM, VG->RCA);  c. 05/2016 MV: EF >65%, no isch/infarct, horiz ST dep in I, II, V5-V6.  Marland Kitchen Depression   . Diverticulosis   . Esophageal stricture   . GERD (gastroesophageal reflux disease)   . Hemorrhoids   . Hiatal hernia   . Hyperkalemia    a. ARB stopped due to this.  . Hyperlipidemia   . Hypertension   . Hypertensive heart disease   . Hypothyroidism   . Mild cognitive impairment    a. seen by neurology.  . Myocardial infarction ([facility]) 12/07/2011  . PAF (paroxysmal atrial fibrillation) ([facility])    a. post-op CABG.  . Pain    RIGHT KNEE PAIN - TORN RIGHT MEDIAL MENISCUS  . Paroxysmal atrial flutter ([facility])    a. 07/2016 s/p TEE & DCCV;  b. 07/2016 Recurrent PAFlutter req initiation of amio & PPM in setting of tachy-brady;  c. CHA2DS2VASc = 7-->Xarelto 15 mg QD.  Marland Kitchen Pneumonia    "twice" (09/27/2018)  . PONV (postoperative nausea and vomiting)   . Presence of permanent cardiac pacemaker 08/12/2016  . S/P CABG (coronary artery bypass graft), 12/04/11 12/07/2011   LIMA to LAD, SVG to OM, SVG to RCA  . Sinus bradycardia    a. not on BB due to this.  . Skin cancer    "face" (09/27/2018)  . Tachy-brady syndrome ([facility])    a. 07/2016 Jxnl brady following DCCV,  recurrent Aflutter-->amio + SJM 2272 Assurity MRI DC PPM (ser # [id number]).  . Type II diabetes mellitus ([facility])     Social History   Socioeconomic History  . Marital status: Widowed    Spouse name: Not on file  . Number of children: 2  . Years of education: 84  . Highest education level: Not on file  Occupational History  .  Occupation: retired  Scientific laboratory technician  . Financial resource strain: Not on file  . Food insecurity:    Worry: Not on file    Inability: Not on file  . Transportation needs:    Medical: Not on file    Non-medical: Not on file  Tobacco Use  . Smoking status: Never Smoker  . Smokeless tobacco: Never Used  Substance and Sexual Activity  . Alcohol use: Not Currently    Comment: very occasional   . Drug use: Never  . Sexual activity: Not Currently  Lifestyle  . Physical activity:    Days per week: Not on file    Minutes per session: Not on file  . Stress: Not on file  Relationships  . Social connections:    Talks on phone: Not on file    Gets together: Not on file    Attends religious service: Not on file    Active member of club or organization: Not on file    Attends meetings of clubs or organizations: Not on file    Relationship status: Not on file  . Intimate partner violence:    Fear of current or ex partner: Not on file    Emotionally abused: Not on file    Physically abused: Not on file    Forced sexual activity: Not on file  Other Topics Concern  . Not on file  Social History Narrative  . Not on file     Family History  Problem Relation Age of Onset  . Diabetes Mother   . CVA Mother   . Hypertension Mother   . Heart disease Father   . Hyperlipidemia Father   . Breast cancer Sister        x 3  . Heart disease Brother        x5; one with MI  . Heart disease Sister        x3  . Diabetes Sister        x3  . Lung cancer Sister   . Breast cancer Sister        x2  . Colon cancer Neg Hx      Review of Systems: General: negative for  chills, fever, night sweats or weight changes.  Cardiovascular: negative for chest pain, dyspnea on exertion, edema, orthopnea, palpitations, paroxysmal nocturnal dyspnea or shortness of breath Dermatological: negative for rash Respiratory: negative for cough or wheezing Urologic: negative for hematuria Abdominal: negative for nausea, vomiting, diarrhea, bright red blood per rectum, melena, or hematemesis Neurologic: negative for visual changes, syncope, or dizziness All other systems reviewed and are otherwise negative except as noted above.    Blood pressure 110/78, pulse 63, height 5' (1.524 m), weight 141 lb 9.6 oz (64.2 kg), SpO2 99 %.  General appearance: alert, cooperative, appears stated age and no distress Neck: no carotid bruit and no JVD Lungs: clear to auscultation bilaterally Heart: regular rate and rhythm Extremities: RT FA without hematoma Skin: pale, cool, dry Neurologic: Grossly normal   ASSESSMENT AND PLAN:   CAD S/P percutaneous coronary angioplasty S/P SVG-OM PCI with DES 09/27/18 Residual 90% dLAD  CKD (chronic kidney disease) stage 4, GFR 15-29 ml/min ([facility]) Followed by Dr Moshe Cipro- SCr 1.83 post staged PCI  Hyperlipidemia LDL goal <70 She has been declined lipid therapy in the past but I think she will start it now  Essential hypertension Controlled- she and her daughter are afraid her B/P being too low- they have stopped Norvasc  Anemia of chronic disease  Her last hgb was 8.2- this may be the cause of some of her symptoms of fatigue. She tells me she is taking her iron "most of the time". I wonder if she wouldn't benefit from erythropoietin- will defer to Dr Moshe Cipro.    PLAN  I suggested she start the lipid therapy ordered by her PCP. I explained to the patient that our goal LDL would be 70 or less. I also asked them to keep track of her B/P -goal 120/80 or less. I told her I thought some of her symptoms could be from her anemia and that she  should follow up with Dr Moshe Cipro about this. I did order labs today- CBC and BMP. F/U with Dr Debara Pickett in 6 weeks.   Kerin Ransom PA-C 10/09/2018 9:45 AM

## 2018-10-09 NOTE — Assessment & Plan Note (Signed)
She has been declined lipid therapy in the past but I think she will start it now

## 2018-10-09 NOTE — Assessment & Plan Note (Addendum)
Controlled- she and her daughter are afraid her B/P being too low- they have stopped Norvasc

## 2018-10-09 NOTE — Patient Instructions (Signed)
Medication Instructions:  STOP Norvasc If you need a refill on your cardiac medications before your next appointment, please call your pharmacy.   Lab work: Your physician recommends that you return for lab work [id number], CBC If you have labs (blood work) drawn today and your tests are completely normal, you will receive your results only by: Marland Kitchen MyChart Message (if you have MyChart) OR . A paper copy in the mail If you have any lab test that is abnormal or we need to change your treatment, we will call you to review the results.  Testing/Procedures: NONE   Follow-Up: At [facility], you and your health needs are our priority.  As part of our continuing mission to provide you with exceptional heart care, we have created designated Provider Care Teams.  These Care Teams include your primary Cardiologist (physician) and Advanced Practice Providers (APPs -  Physician Assistants and Nurse Practitioners) who all work together to provide you with the care you need, when you need it. . Your physician recommends that you schedule a follow-up appointment in: [place].  Any Other Special Instructions Will Be Listed Below (If Applicable).

## 2018-10-09 NOTE — Assessment & Plan Note (Signed)
Her last hgb was 8.2- this may be the cause of some of her symptoms of fatigue. She tells me she is taking her iron "most of the time". I wonder if she wouldn't benefit from erythropoietin- will defer to Dr Moshe Cipro.

## 2018-10-09 NOTE — Assessment & Plan Note (Signed)
Followed by Dr Moshe Cipro- SCr 1.83 post staged PCI

## 2018-10-09 NOTE — Assessment & Plan Note (Signed)
S/P SVG-OM PCI with DES 09/27/18 Residual 90% dLAD

## 2018-10-24 LAB — CUP PACEART REMOTE DEVICE CHECK
Battery Remaining Longevity: 123 mo
Battery Remaining Percentage: 95.5 %
Battery Voltage: 2.99 V
Brady Statistic AP VP Percent: 1 %
Brady Statistic AP VS Percent: 99 %
Brady Statistic AS VP Percent: 1 %
Brady Statistic AS VS Percent: 1 %
Brady Statistic RA Percent Paced: 99 %
Brady Statistic RV Percent Paced: 1 %
Date Time Interrogation Session: 20191029060014
Implantable Lead Implant Date: 20171019
Implantable Lead Implant Date: 20171019
Implantable Lead Location: 753859
Implantable Lead Location: 753860
Implantable Pulse Generator Implant Date: 20171019
Lead Channel Impedance Value: 440 Ohm
Lead Channel Impedance Value: 900 Ohm
Lead Channel Pacing Threshold Amplitude: 0.5 V
Lead Channel Pacing Threshold Amplitude: 1 V
Lead Channel Pacing Threshold Pulse Width: 0.4 ms
Lead Channel Pacing Threshold Pulse Width: 0.4 ms
Lead Channel Sensing Intrinsic Amplitude: 12 mV
Lead Channel Sensing Intrinsic Amplitude: 5 mV
Lead Channel Setting Pacing Amplitude: 2 V
Lead Channel Setting Pacing Amplitude: 2.5 V
Lead Channel Setting Pacing Pulse Width: 0.4 ms
Lead Channel Setting Sensing Sensitivity: 2 mV
Pulse Gen Model: 2272
Pulse Gen Serial Number: 3180458

## 2018-11-21 ENCOUNTER — Ambulatory Visit (INDEPENDENT_AMBULATORY_CARE_PROVIDER_SITE_OTHER): Payer: Medicare Other

## 2018-11-21 DIAGNOSIS — I495 Sick sinus syndrome: Secondary | ICD-10-CM

## 2018-11-21 DIAGNOSIS — I5033 Acute on chronic diastolic (congestive) heart failure: Secondary | ICD-10-CM

## 2018-11-22 NOTE — Progress Notes (Signed)
Remote pacemaker transmission.   

## 2018-11-23 LAB — CUP PACEART REMOTE DEVICE CHECK
Battery Remaining Longevity: 122 mo
Battery Remaining Percentage: 95.5 %
Battery Voltage: 2.99 V
Brady Statistic AP VP Percent: 1 %
Brady Statistic AP VS Percent: 99 %
Brady Statistic AS VP Percent: 1 %
Brady Statistic AS VS Percent: 1 %
Brady Statistic RA Percent Paced: 98 %
Brady Statistic RV Percent Paced: 1 %
Date Time Interrogation Session: 20200129080859
Implantable Lead Implant Date: 20171019
Implantable Lead Implant Date: 20171019
Implantable Lead Location: 753859
Implantable Lead Location: 753860
Implantable Pulse Generator Implant Date: 20171019
Lead Channel Impedance Value: 430 Ohm
Lead Channel Impedance Value: 830 Ohm
Lead Channel Pacing Threshold Amplitude: 0.5 V
Lead Channel Pacing Threshold Amplitude: 1 V
Lead Channel Pacing Threshold Pulse Width: 0.4 ms
Lead Channel Pacing Threshold Pulse Width: 0.4 ms
Lead Channel Sensing Intrinsic Amplitude: 1.3 mV
Lead Channel Sensing Intrinsic Amplitude: 12 mV
Lead Channel Setting Pacing Amplitude: 2 V
Lead Channel Setting Pacing Amplitude: 2.5 V
Lead Channel Setting Pacing Pulse Width: 0.4 ms
Lead Channel Setting Sensing Sensitivity: 2 mV
Pulse Gen Model: 2272
Pulse Gen Serial Number: 3180458

## 2018-11-27 ENCOUNTER — Ambulatory Visit: Payer: Medicare Other | Admitting: Internal Medicine

## 2018-11-27 ENCOUNTER — Encounter: Payer: Self-pay | Admitting: Internal Medicine

## 2018-11-27 VITALS — BP 154/63 | HR 60 | Ht 61.0 in | Wt 139.2 lb

## 2018-11-27 DIAGNOSIS — Z9861 Coronary angioplasty status: Secondary | ICD-10-CM | POA: Diagnosis not present

## 2018-11-27 DIAGNOSIS — I25810 Atherosclerosis of coronary artery bypass graft(s) without angina pectoris: Secondary | ICD-10-CM | POA: Diagnosis not present

## 2018-11-27 DIAGNOSIS — I2510 Atherosclerotic heart disease of native coronary artery without angina pectoris: Secondary | ICD-10-CM | POA: Diagnosis not present

## 2018-11-27 DIAGNOSIS — E785 Hyperlipidemia, unspecified: Secondary | ICD-10-CM | POA: Diagnosis not present

## 2018-11-27 MED ORDER — AMLODIPINE BESYLATE 2.5 MG PO TABS: 2.5000 mg | ORAL_TABLET | Freq: Every day | ORAL | 3 refills | Status: DC

## 2018-11-27 NOTE — Patient Instructions (Signed)
Medication Instructions:  START amlodipine 2.5mg  daily If you need a refill on your cardiac medications before your next appointment, please call your pharmacy.   Follow-Up: At [facility], you and your health needs are our priority.  As part of our continuing mission to provide you with exceptional heart care, we have created designated Provider Care Teams.  These Care Teams include your primary Cardiologist (physician) and Advanced Practice Providers (APPs -  Physician Assistants and Nurse Practitioners) who all work together to provide you with the care you need, when you need it. You will need a follow up appointment in 6 months.  Please call our office 2 months in advance to schedule this appointment.  You may see [name], MD or one of the following Advanced Practice Providers on your designated Care Team: [place], [place] . Fabian Sharp, PA-C  Any Other Special Instructions Will Be Listed Below (If Applicable).

## 2018-11-27 NOTE — Progress Notes (Signed)
OFFICE NOTE  Chief Complaint:  Follow-up cath  Primary Care Physician: Leeroy Cha, MD  HPI:  [name] is a pleasant 78 year old female with history of coronary disease and CABG x 3 vessels in 2013, LIMA to LAD, SVG to OM and SVG to right coronary. She underwent an echocardiogram which demonstrates normal EF of 55% to 65%. However, there is stage 1 diastolic dysfunction and evidence for elevated mean left atrial filling pressure. There were mildly thickened calcified aortic valve leaflets but no stenosis and mild tricuspid regurgitation. At the time, I started her on Lasix 20 mg daily. A BMP was obtained that day which indicated mild elevation at 75.5, creatinine was 1.16. She has been taking the Lasix with improvement in her lower extremity swelling although shortness of breath seems to be about the same. Again, she notes it is only with marked exertion, not necessarily with normal activities. However, she does have ongoing fatigue and easy fatigability. This may be due to just poor exercise tolerance. She also reported that recently you started her on a new SLGT2 diabetic medicine and she said that she took several doses of that, which I believe was Invokana, and she became markedly dizzy with that. She has since stopped that medication. Again, overall the patient is fairly stable with a normal ejection fraction. She recently had bypass and is not having any anginal symptoms and therefore I do not suspect that this is an issue related to her bypass grafts.   I saw [name] today in the office for an acute visit regarding what is described as abdominal pain. She reports a numbness and pain sensation over the right upper quadrant. She's also noted some firmness and distention of her abdomen. This is recently presented to a GI doctor in [place] who thought she might have a gastric ulcer. He performed endoscopy which was a reportedly unrevealing. He also recommended she is take  MiraLAX for constipation. She says that recently she's been constipated but also has been having some small amount of loose stool. She did not want to take the MiraLAX given the loose stool. She also reports some of the discomfort extends across the mid epigastrium and into the lower chest. It is entirely atypical for cardiac chest pain. She reports that she had a colonoscopy in the past by Dr. Henrene Pastor about 5 years ago and a repeat colonoscopy was recommended around 2018. She also tells me that she recently had a right upper quadrant ultrasound performed at [facility] after presenting with abdominal pain which was negative for acute biliary disease.  [name] returns today for follow-up. Overall she seems to be doing well from a cardiac standpoint. I understand she is switched primary care providers to [facility] primary care, however she is unclear of her primary care provider. She does have an upcoming appointment to see Jackelyn Knife neurology. Recently she's been having trouble with forgetfulness and it seems to be getting worse. She tells me she lost her cell phone at home and has not been able to locate it. She denies any chest pain or shortness of breath and in fact her flank back and abdominal pain which was evaluated extensively has now gone away.  I had the pleasure of seeing [name] back today in follow-up. It's been 6 months and she'll last appointment. Her main concern today is leg weakness. She reports she has some back pain and associated weakness in her legs that give out from time  to time. She recently had memory testing indicating possible dementia as well as likely depression. It was recommended that she go on therapy however she declined medications. She denies any chest pain or worsening shortness of breath.  07/01/2016  [name] returns today for follow-up. She was recently hospitalized for acute diastolic heart failure. She was treated with diuretics and  adjustments are made to her blood pressure medications. She had a repeat echocardiogram which was essentially stable showed normal LV EF and diastolic dysfunction. After discharge she underwent nuclear stress testing which was negative for ischemia. She saw Ignacia Bayley, NP, who made some changes to her blood pressure medications including placing her on clonidine. Since taking this medicine she has had some of the typical symptoms of fatigue and dry mouth as well as constipation. She was alert initially taking clonidine 2 mg twice daily. She then decreased it to one half tablet or 1 mg twice daily but blood pressure remains high. I checked her blood pressure manually today 160/60. She reports fatigue and low energy.  09/22/2016  [name] returns today for hospital follow-up. 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 She had a St. Jude surety MRI safe pacemaker placed. She was also started on amiodarone and underwent cardioversion. She seems to be maintaining sinus rhythm. She reports improvement in her energy. She recently saw Ignacia Bayley, NP, in follow-up who reported she was doing well although noted her blood pressure was more elevated. She was placed on doxazosin 4 mg daily and had initial improvement in blood pressure however blood pressure has since gone up. Today was 186/62. Unfortunately, she's been intolerant of a number of blood pressure medications in the past.  12/23/2016  [name] was seen in follow-up today. She saw Dr. candidates in January who noted that she reported being shaky on amiodarone and he decreased the dose down to 100 mg daily. She remains in an AV paced rhythm but there has been recurrent underlying atrial flutter. She is also had some fatigue. The pressure is elevated today 156/70. I reviewed home monitoring indicates blood pressures continue to be elevated. She seems to be okay with these numbers but she  is at increased risk for events. Unfortunate she's had intolerance to number of medications.  04/22/2017  [name] returns today for follow-up. She recently saw Almyra Deforest, PA-C, who noted that she had had some weight gain and lower extremity edema. He discontinued her chlorthalidone and put her on low-dose Lasix. She's had improvement in her swallowing with this and some weight loss. She then followed up with Dr. Moshe Cipro at [facility]. Her creatinine was noted to be 2.7 and she is undergoing a renal ultrasound and further workup for this. She was also noted to be anemic with a low iron saturation. She's been receiving some IV iron. She seems to be maintaining sinus rhythm on low-dose amiodarone. Recently she saw her endocrinologist who had increased her Synthroid. Her last TSH was between 7 and 8. She is due for repeat check of that. She has done well with the placement of a pacemaker in the fall. She has remote checks to follow that.  08/25/2017  [name] was seen today in follow-up.  Recently she had about 12 pound weight gain and saw her nephrologist.  She was started back on Lasix and was noted to have an elevated creatinine now back up to 2.14.  In addition her blood pressure was running low, specifically  her diastolic blood pressure and her amlodipine was decreased to 5 mg daily.  She seems to be maintaining sinus rhythm and is on low-dose amiodarone.  She is in a paced rhythm today and recent remote check showed no real evidence of atrial fibrillation.  She was on Xarelto however stopped that on her own recently because of fatigue and weakness.  She was also noted to be anemic and has been started on iron.  This begs a question of whether she may be having microscopic bleeding or whether her anemia was related to chronic kidney disease.  Nevertheless, she has an appropriately elevated chads vascular score and should be anticoagulated on Xarelto.  10/26/2017  [name] returns today  for follow-up.  She again is gained about 5 or 6 pounds.  Apparently she had seen her nephrologist and was started back on Lasix however her creatinine is rising.  I repeated labs on October 10, 2017 which showed a creatinine had increased up to 2.24, previously 1.9 and 2.45.  Her pro-BNP was elevated at 1437.  Since that time she has had some worsening shortness of breath, lower extremity swelling and increasing abdominal girth.  Blood pressure is quite elevated today 190/70, however she says is been lower at home.  06/12/2018  [name] is again seen today in follow-up.  She has had some increased abdominal girth but no significant swelling or edema.  Remote pacemaker checks have been performed which showed normal battery life and adequate pacer function.  She is noted today to be in an atrial paced rhythm with prolonged AV conduction at 60.  She denies any chest pain or worsening shortness of breath. She was recently seen by Dr. Moshe Cipro at [facility].  It is felt that her creatinine has been somewhat stable.  11/27/2018  [name] seen today for follow-up of left heart catheterization.  This occurred in December after having symptoms of progressive shortness of breath and chest tightness.  She did have a stress test in November 2019 which showed a new area of ischemia in the OM territory.  She underwent left heart catheterization which showed high-grade graft disease with 95% ostial stenosis in the graft to the marginal vessel which likely accounted for the nuclear study changes.  However, given her chronic kidney disease stage PCI was recommended.  She went back for PCI in December 2019 which was successful.  Since then she is done fairly well.  Immediately had some improvement in shortness of breath and discomfort however has had some recurrent shortness of breath.  She has had some improvement in dyslipidemia now after restarting rosuvastatin 5 mg daily however LDL remains elevated at 114.   She previously could not tolerate higher doses of statins however I am not sure as she has been on high potency rosuvastatin.  She also remains on Plavix and Xarelto which I think may be a lifelong combination for her.  Also, her blood pressure has been running little higher after stopping her amlodipine recently because of lower blood pressure.  PMHx:  Past Medical History:  Diagnosis Date  . Anemia   . Anxiety   . Arthritis    "in my hands; knees, back" (09/27/2018)  . CAD (coronary artery disease)    a. 40-59% bilaterally 10/2015.  Marland Kitchen Chronic diastolic CHF (congestive heart failure) ([place])    a. 05/2016 Echo: EF 60-65%, no rwma, Gr1 DD, Ao sclerosis w/o stenosis, triv MR;  b. 07/2016 TEE: EF 55-60%, no rwma, mild MR.  Marland Kitchen  CKD (chronic kidney disease), stage III ([place])   . Coronary artery disease    a. 02/2007 Persantine MV: low risk;  b. 11/2011 CABG x 3 (LIMA->LAD, VG->OM, VG->RCA);  c. 05/2016 MV: EF >65%, no isch/infarct, horiz ST dep in I, II, V5-V6.  Marland Kitchen Depression   . Diverticulosis   . Esophageal stricture   . GERD (gastroesophageal reflux disease)   . Hemorrhoids   . Hiatal hernia   . Hyperkalemia    a. ARB stopped due to this.  . Hyperlipidemia   . Hypertension   . Hypertensive heart disease   . Hypothyroidism   . Mild cognitive impairment    a. seen by neurology.  . Myocardial infarction ([place]) 12/07/2011  . PAF (paroxysmal atrial fibrillation) ([facility])    a. post-op CABG.  . Pain    RIGHT KNEE PAIN - TORN RIGHT MEDIAL MENISCUS  . Paroxysmal atrial flutter ([place])    a. 07/2016 s/p TEE & DCCV;  b. 07/2016 Recurrent PAFlutter req initiation of amio & PPM in setting of tachy-brady;  c. CHA2DS2VASc = 7-->Xarelto 15 mg QD.  Marland Kitchen Pneumonia    "twice" (09/27/2018)  . PONV (postoperative nausea and vomiting)   . Presence of permanent cardiac pacemaker 08/12/2016  . S/P CABG (coronary artery bypass graft), 12/04/11 12/07/2011   LIMA to LAD, SVG to OM, SVG to RCA  . Sinus bradycardia    a. not  on BB due to this.  . Skin cancer    "face" (09/27/2018)  . Tachy-brady syndrome ([place])    a. 07/2016 Jxnl brady following DCCV, recurrent Aflutter-->amio + SJM 2272 Assurity MRI DC PPM (ser # [id number]).  . Type II diabetes mellitus ([place])     Past Surgical History:  Procedure Laterality Date  . ABDOMINAL HYSTERECTOMY  1980's  . ANKLE FRACTURE SURGERY Right    "put pins both side right ankle"  . BACK SURGERY  2006   "cyst growing near my spine"  . CARDIAC CATHETERIZATION  12/02/2011   mild LV dysfunction with mod hypocontractility of mid-distal anterolateral wall; CAD w/ostial tapering of L [place] with 50% diffuse ostial narrowing of LAD, 99% eccentric focal prox LAD stenosis followed by 70% prox LAD stenosis after 1st diag, 20% mid LAD narrowing; 80% ostial-to-prox L Cfx stenosis & 40-50% irregularity of RCA (Dr. Corky Downs)  . CARDIOVERSION N/A 08/11/2016   Procedure: CARDIOVERSION;  Surgeon: Lelon Perla, MD;  Location: [facility] ENDOSCOPY;  Service: Cardiovascular;  Laterality: N/A;  . CATARACT EXTRACTION W/ INTRAOCULAR LENS  IMPLANT, BILATERAL Bilateral ~ 2010  . [place]  . CORONARY ARTERY BYPASS GRAFT  12/04/2011   Procedure: CORONARY ARTERY BYPASS GRAFTING (CABG);  Surgeon: Tharon Aquas Adelene Idler, MD;  Location: [place];  Service: Open Heart Surgery;  Laterality: N/A;  CABG x three,  using left internal mammary artery, and right leg greater saphenous vein harvested endoscopically  . CORONARY STENT INTERVENTION N/A 09/27/2018   Procedure: CORONARY STENT INTERVENTION;  Surgeon: Troy Sine, MD;  Location: [place] CV LAB;  Service: Cardiovascular;  Laterality: N/A;  . DILATION AND CURETTAGE OF UTERUS     "a couple times"  . EP IMPLANTABLE DEVICE N/A 08/12/2016   Procedure: Pacemaker Implant;  Surgeon: Will Meredith Leeds, MD;  Location: [place] CV LAB;  Service: Cardiovascular;  Laterality: N/A;  . ESOPHAGOGASTRODUODENOSCOPY (EGD) WITH ESOPHAGEAL DILATION    . FRACTURE SURGERY     . JOINT REPLACEMENT    . KNEE ARTHROSCOPY WITH MEDIAL MENISECTOMY Right  07/02/2014   Procedure: RIGHT KNEE ARTHROSCOPY WITH PARTIAL MEDIAL MENISTECTOMY, ABRASION CONDROPLASTYU OF PATELLA,ABRASION CONDROPLASTY OF MEDIAL FEMEROL CONDYL, MICROFRACTURE OF MEDIAL FEMEROL CONDYL;  Surgeon: Tobi Bastos, MD;  Location: [facility] ORS;  Service: Orthopedics;  Laterality: Right;  . LEFT HEART CATH AND CORS/GRAFTS ANGIOGRAPHY N/A 09/06/2018   Procedure: LEFT HEART CATH AND CORS/GRAFTS ANGIOGRAPHY;  Surgeon: Troy Sine, MD;  Location: [place] CV LAB;  Service: Cardiovascular;  Laterality: N/A;  . LEFT HEART CATHETERIZATION WITH CORONARY ANGIOGRAM N/A 12/02/2011   Procedure: LEFT HEART CATHETERIZATION WITH CORONARY ANGIOGRAM;  Surgeon: Troy Sine, MD;  Location: [facility] CATH LAB;  Service: Cardiovascular;  Laterality: N/A;  Coronary angiogram, possible PCI  . TEE WITHOUT CARDIOVERSION N/A 08/11/2016   Procedure: TRANSESOPHAGEAL ECHOCARDIOGRAM (TEE);  Surgeon: Lelon Perla, MD;  Location: [place];  Service: Cardiovascular;  Laterality: N/A;  . TONSILLECTOMY  1949  . TOTAL KNEE ARTHROPLASTY Left ~ 2006  . TRANSTHORACIC ECHOCARDIOGRAM  02/19/2013   EF 06-26%, grade 1 diastolic dysfunction; mildly thickend/calcified AV leaflets; mildly calcidied MV annulus; mild TR    FAMHx:  Family History  Problem Relation Age of Onset  . Diabetes Mother   . CVA Mother   . Hypertension Mother   . Heart disease Father   . Hyperlipidemia Father   . Breast cancer Sister        x 3  . Heart disease Brother        x5; one with MI  . Heart disease Sister        x3  . Diabetes Sister        x3  . Lung cancer Sister   . Breast cancer Sister        x2  . Colon cancer Neg Hx     SOCHx:   reports that she has never smoked. She has never used smokeless tobacco. She reports previous alcohol use. She reports that she does not use drugs.  ALLERGIES:  Allergies  Allergen Reactions  . Clonidine Derivatives  Other (See Comments)    Bradycardia and fatigue   . Sulfa Antibiotics Other (See Comments)    Unknown  . Crestor [Rosuvastatin Calcium] Other (See Comments)    Other reaction(s): tired and weak  . Epinephrine Other (See Comments)    Abnormal feeling. Dental exam/injection of local w/ epi.  Marland Kitchen Hydralazine Other (See Comments)    Nausea/gi upset   . Losartan Other (See Comments)    Hyperkalemia   . Other Other (See Comments)    MANGO'S - WELTS ALL OVER    ROS: Pertinent items noted in HPI and remainder of comprehensive ROS otherwise negative.  HOME MEDS: Current Outpatient Medications  Medication Sig Dispense Refill  . Ascorbic Acid (VITAMIN C PO) Take 1 tablet by mouth 3 (three) times a week.    . carvedilol (COREG) 6.25 MG tablet TAKE 1 TABLET BY MOUTH TWICE DAILY 60 tablet 11  . clopidogrel (PLAVIX) 75 MG tablet Take 1 tablet (75 mg total) by mouth daily. 90 tablet 4  . Cyanocobalamin (VITAMIN B-12 PO) Take 1 tablet by mouth 3 (three) times a week.     . docusate sodium (COLACE) 100 MG capsule Take 100 mg by mouth 2 (two) times daily as needed for mild constipation or moderate constipation.    Marland Kitchen esomeprazole (NEXIUM) 20 MG capsule TAKE 1 CAPSULE BY MOUTH ONCE DAILY AT  12  NOON 90 capsule 1  . ferrous sulfate 325 (65 FE) MG EC tablet Take  325 mg by mouth daily with breakfast.    . gabapentin (NEURONTIN) 300 MG capsule Take 300 mg by mouth 2 (two) times daily.     Marland Kitchen LANTUS SOLOSTAR 100 UNIT/ML Solostar Pen Inject 22 Units into the skin daily.  6  . levothyroxine (SYNTHROID) 88 MCG tablet Take 88 mcg by mouth daily before breakfast.     . meclizine (ANTIVERT) 25 MG tablet Take 25 mg by mouth daily as needed for dizziness. For dizziness    . Multiple Vitamins-Minerals (PRESERVISION AREDS 2 PO) Take 1 tablet by mouth every evening.     Vladimir Faster Glycol-Propyl Glycol (SYSTANE OP) Place 1 drop into both eyes 2 (two) times daily.     . rosuvastatin (CRESTOR) 5 MG tablet Take 5 mg by  mouth daily.    Marland Kitchen torsemide (DEMADEX) 20 MG tablet Take 40 mg by mouth every morning.    Alveda Reasons 15 MG TABS tablet TAKE 1 TABLET BY MOUTH ONCE DAILY WITH SUPPER 90 tablet 1  . amLODipine (NORVASC) 2.5 MG tablet Take 1 tablet (2.5 mg total) by mouth daily. 90 tablet 3  . nitroGLYCERIN (NITROSTAT) 0.4 MG SL tablet Place 1 tablet (0.4 mg total) under the tongue every 5 (five) minutes as needed for chest pain. 25 tablet 3   No current facility-administered medications for this visit.     LABS/IMAGING: No results found for this or any previous visit (from the past 48 hour(s)). No results found.  VITALS: BP (!) 154/63   Pulse 60   Ht 5\' 1"  (1.549 m)   Wt 139 lb 3.2 oz (63.1 kg)   BMI 26.30 kg/m   EXAM: General appearance: alert and no distress Neck: no carotid bruit and no JVD Lungs: clear to auscultation bilaterally Heart: regular rate and rhythm, S1, S2 normal, no murmur, click, rub or gallop Abdomen: soft, non-tender; bowel sounds normal; no masses,  no organomegaly Extremities: edema Trace to 1+ bilateral lower extremity edema Pulses: 2+ and symmetric Skin: Skin color, texture, turgor normal. No rashes or lesions Neurologic: Grossly normal  EKG: Deferred  ASSESSMENT: 1. Unstable angina with recent PCI to the SVG to marginal graft  2. Coronary artery disease status post CABG x 3 (LIMA to LAD, SVG to Diag, SVG to RCA) in 2013 3. Paroxysmal atrial flutter-CHADSVASC score of 5 on Xarelto 4. Tachybradycardia syndrome status post St. Jude permanent pacemaker (07/2016) 5. Diabetes type 2 - now on insulin 6. Hypertension 7. Dyslipidemia 8. Peripheral neuropathy - secondary to diabetes 9. Progressive memory loss 10. Low back pain and leg weakness  11. CKD 3  PLAN: 1.   Acquanetta had successful PCI to the vein graft to diagonal.  She has had some recurrent symptoms of shortness of breath.  This may be related to elevated blood pressure.  LVEDP has been high.  She was taken off of  amlodipine because of lower blood pressure, but I think we will have to restart 2.5 mg daily.  LDL remains above target.  She is on low-dose Crestor.  I encouraged him to increase the dose to 20 mg since there is little evidence that 5 mg is associated with any significant risk reduction and additionally she is well above target LDL less than 70.  If she is not able to tolerate the higher dose of Crestor, then we could consider adding ezetimibe or more likely go to PCSK9 inhibitor.  [name], MD, [facility], [place] Director of  the [facility] Attending Cardiologist  Direct Dial: [phone]  Fax: [phone]  Website:  [name] 11/27/2018, 1:13 PM

## 2018-11-29 ENCOUNTER — Other Ambulatory Visit: Payer: Self-pay | Admitting: Internal Medicine

## 2019-01-22 ENCOUNTER — Encounter: Payer: Self-pay | Admitting: *Deleted

## 2019-01-22 ENCOUNTER — Telehealth: Payer: Self-pay | Admitting: Internal Medicine

## 2019-01-22 MED ORDER — ISOSORBIDE MONONITRATE ER 30 MG PO TB24: 30.0000 mg | ORAL_TABLET | Freq: Every day | ORAL | 3 refills | Status: DC

## 2019-01-22 NOTE — Telephone Encounter (Signed)
New Message          Patient's daughter [name]) is calling today to talk about some issues that her mom is having since she got the device. Pls call to advise.

## 2019-01-22 NOTE — Telephone Encounter (Signed)
Ok- start imdur 30 mg daily. See if you can schedule a teleHealth follow-up in 2 weeks.  Dr. Lemmie Evens

## 2019-01-22 NOTE — Telephone Encounter (Signed)
Advised daughter, verbalized understanding. Video visit scheduled

## 2019-01-22 NOTE — Telephone Encounter (Signed)
Spoke with [name] and her mother has been having chest pain up into her throat and hard to catch her breath. Most of the time with exertion but has happened at rest. She has used NTG for episodes, sometimes 2 or even 3 tablets. Blood pressures at home 140's-150's/50's-60's. She does take her medications as prescribed, daughter fixes her pill box each week. Per daughter patient does not want to go through another stress test and likely not another cardiac cath. Will forward to Dr Debara Pickett for review

## 2019-02-06 ENCOUNTER — Telehealth: Payer: Self-pay | Admitting: *Deleted

## 2019-02-06 NOTE — Telephone Encounter (Signed)
Called patient to let them know due to recent [place] and Health Department Protocols, we are not seeing patients in the office. We are instead seeing if they would like to schedule this appointment as a Nurse, children's or Laptop. Unable to reach patient. LVMTCB

## 2019-02-08 ENCOUNTER — Telehealth: Payer: Self-pay | Admitting: Physician Assistant

## 2019-02-08 NOTE — Telephone Encounter (Signed)
Follow up    Patients daughter [name] is calling in reference to upcoming appt with Dr. Curt Bears. She advises that the virtual visit is fine. The contact number is [phone]

## 2019-02-08 NOTE — Telephone Encounter (Signed)
F/U Message             Patient's daughter returned the call, would like a call back.

## 2019-02-08 NOTE — Telephone Encounter (Signed)
Tried to return patient's call and explain Doximity. Was unable to reach Daughter. Phone would ring then hang up.

## 2019-02-08 NOTE — Telephone Encounter (Signed)
dgt smartphone/my chart/consent obtained/pre reg complete/dc/04.16.2020

## 2019-02-09 ENCOUNTER — Telehealth (INDEPENDENT_AMBULATORY_CARE_PROVIDER_SITE_OTHER): Payer: Medicare Other | Admitting: Physician Assistant

## 2019-02-09 ENCOUNTER — Encounter: Payer: Self-pay | Admitting: Physician Assistant

## 2019-02-09 VITALS — BP 145/56 | HR 60

## 2019-02-09 DIAGNOSIS — I5032 Chronic diastolic (congestive) heart failure: Secondary | ICD-10-CM

## 2019-02-09 DIAGNOSIS — E785 Hyperlipidemia, unspecified: Secondary | ICD-10-CM

## 2019-02-09 DIAGNOSIS — Z950 Presence of cardiac pacemaker: Secondary | ICD-10-CM

## 2019-02-09 DIAGNOSIS — I25709 Atherosclerosis of coronary artery bypass graft(s), unspecified, with unspecified angina pectoris: Secondary | ICD-10-CM

## 2019-02-09 DIAGNOSIS — R0789 Other chest pain: Secondary | ICD-10-CM | POA: Diagnosis not present

## 2019-02-09 DIAGNOSIS — I10 Essential (primary) hypertension: Secondary | ICD-10-CM

## 2019-02-09 DIAGNOSIS — E039 Hypothyroidism, unspecified: Secondary | ICD-10-CM

## 2019-02-09 DIAGNOSIS — R079 Chest pain, unspecified: Secondary | ICD-10-CM

## 2019-02-09 DIAGNOSIS — E119 Type 2 diabetes mellitus without complications: Secondary | ICD-10-CM

## 2019-02-09 NOTE — Telephone Encounter (Signed)
Followed up w/ pt about appt w/ [facility] next Tuesday. Spoke to dtr. Confirm ok for virtual visit using her cell phone. Telehealth consent in EPIC

## 2019-02-09 NOTE — Telephone Encounter (Signed)
Virtual Visit Pre-Appointment Phone Call  Steps For Call:  1. Confirm consent - "In the setting of the current Covid19 crisis, you are scheduled for a (phone or video) visit with your provider on (date) at (time).  Just as we do with many in-office visits, in order for you to participate in this visit, we must obtain consent.  If you'd like, I can send this to your mychart (if signed up) or email for you to review.  Otherwise, I can obtain your verbal consent now.  All virtual visits are billed to your insurance company just like a normal visit would be.  By agreeing to a virtual visit, we'd like you to understand that the technology does not allow for your provider to perform an examination, and thus may limit your provider's ability to fully assess your condition.  Finally, though the technology is pretty good, we cannot assure that it will always work on either your or our end, and in the setting of a video visit, we may have to convert it to a phone-only visit.  In either situation, we cannot ensure that we have a secure connection.  Are you willing to proceed?" STAFF: Did the patient verbally acknowledge consent to telehealth visit? Document YES/NO here: YES  2. Confirm the BEST phone number to call the day of the visit by including in appointment notes  3. Give patient instructions for WebEx/MyChart download to smartphone as below or Doximity/Doxy.me if video visit (depending on what platform provider is using)  4. Advise patient to be prepared with their blood pressure, heart rate, weight, any heart rhythm information, their current medicines, and a piece of paper and pen handy for any instructions they may receive the day of their visit  5. Inform patient they will receive a phone call 15 minutes prior to their appointment time (may be from unknown caller ID) so they should be prepared to answer  6. Confirm that appointment type is correct in Epic appointment notes (VIDEO vs PHONE)      TELEPHONE CALL NOTE  BRENDALY TOWNSEL has been deemed a candidate for a follow-up tele-health visit to limit community exposure during the Covid-19 pandemic. I spoke with the patient via phone to ensure availability of phone/video source, confirm preferred email & phone number, and discuss instructions and expectations.  I reminded CHRYSTINA NAFF to be prepared with any vital sign and/or heart rhythm information that could potentially be obtained via home monitoring, at the time of her visit. I reminded ABBIGAL RADICH to expect a phone call at the time of her visit if her visit.    02/09/2019 9:10 AM   INSTRUCTIONS FOR DOWNLOADING THE WEBEX APP TO SMARTPHONE  - If Apple, ask patient to go to [employer] and type in WebEx in the search bar. [employer], the blue/green circle. If Android, go to [employer] and type in [employer] in the search bar. The app is free but as with any other app downloads, their phone may require them to verify saved payment information or Apple/Android password.  - The patient does NOT have to create an account. - On the day of the visit, the assist will walk the patient through joining the meeting with the meeting number/password.  INSTRUCTIONS FOR DOWNLOADING THE MYCHART APP TO SMARTPHONE  - The patient must first make sure to have activated MyChart and know their login information - If Apple, go to [employer] and type in [employer]  in the search bar and download the app. If Android, ask patient to go to [employer] and type in [place] in the search bar and download the app. The app is free but as with any other app downloads, their phone may require them to verify saved payment information or Apple/Android password.  - The patient will need to then log into the app with their MyChart username and password, and select [facility] as their healthcare provider to link the account. When it is time for your visit, go to the MyChart app,  find appointments, and click Begin Video Visit. Be sure to Select Allow for your device to access the Microphone and Camera for your visit. You will then be connected, and your provider will be with you shortly.  **If they have any issues connecting, or need assistance please contact MyChart service desk [phone])**  **If using a computer, in order to ensure the best quality for their visit they will need to use either of the following Internet Browsers: [employer], or Google Chrome**  IF USING DOXIMITY or DOXY.ME - The patient will receive a link just prior to their visit, either by text or email (to be determined day of appointment depending on if it's doxy.me or Doximity).     FULL LENGTH CONSENT FOR TELE-HEALTH VISIT   I hereby voluntarily request, consent and authorize [facility] and its employed or contracted physicians, physician assistants, nurse practitioners or other licensed health care professionals (the Practitioner), to provide me with telemedicine health care services (the "Services") as deemed necessary by the treating Practitioner. I acknowledge and consent to receive the Services by the Practitioner via telemedicine. I understand that the telemedicine visit will involve communicating with the Practitioner through live audiovisual communication technology and the disclosure of certain medical information by electronic transmission. I acknowledge that I have been given the opportunity to request an in-person assessment or other available alternative prior to the telemedicine visit and am voluntarily participating in the telemedicine visit.  I understand that I have the right to withhold or withdraw my consent to the use of telemedicine in the course of my care at any time, without affecting my right to future care or treatment, and that the Practitioner or I may terminate the telemedicine visit at any time. I understand that I have the right to inspect all  information obtained and/or recorded in the course of the telemedicine visit and may receive copies of available information for a reasonable fee.  I understand that some of the potential risks of receiving the Services via telemedicine include:  Marland Kitchen Delay or interruption in medical evaluation due to technological equipment failure or disruption; . Information transmitted may not be sufficient (e.g. poor resolution of images) to allow for appropriate medical decision making by the Practitioner; and/or  . In rare instances, security protocols could fail, causing a breach of personal health information.  Furthermore, I acknowledge that it is my responsibility to provide information about my medical history, conditions and care that is complete and accurate to the best of my ability. I acknowledge that Practitioner's advice, recommendations, and/or decision may be based on factors not within their control, such as incomplete or inaccurate data provided by me or distortions of diagnostic images or specimens that may result from electronic transmissions. I understand that the practice of medicine is not an exact science and that Practitioner makes no warranties or guarantees regarding treatment outcomes. I acknowledge that I will receive a copy of  this consent concurrently upon execution via email to the email address I last provided but may also request a printed copy by calling the office of [facility].    I understand that my insurance will be billed for this visit.   I have read or had this consent read to me. . I understand the contents of this consent, which adequately explains the benefits and risks of the Services being provided via telemedicine.  . I have been provided ample opportunity to ask questions regarding this consent and the Services and have had my questions answered to my satisfaction. . I give my informed consent for the services to be provided through the use of telemedicine in my  medical care  By participating in this telemedicine visit I agree to the above.

## 2019-02-09 NOTE — Progress Notes (Signed)
Virtual Visit via Video Note   This visit type was conducted due to national recommendations for restrictions regarding the COVID-19 Pandemic (e.g. social distancing) in an effort to limit this patient's exposure and mitigate transmission in our community.  Due to her co-morbid illnesses, this patient is at least at moderate risk for complications without adequate follow up.  This format is felt to be most appropriate for this patient at this time.  All issues noted in this document were discussed and addressed.  A limited physical exam was performed with this format.  Please refer to the patient's chart for her consent to telehealth for [facility].   Evaluation Performed:  Follow-up visit  Date:  02/11/2019   ID:  [name], DOB 1941/07/27, MRN [id number]  Patient Location: Home Provider Location: Home  PCP:  Leeroy Cha, MD  Cardiologist:  [name], MD  Electrophysiologist:  Constance Haw, MD   Chief Complaint:  followup  History of Present Illness:    [name] is a 78 y.o. female with with past medical history of chronic diastolic heart failure, CKD stage III, CAD s/p CABG in 11/2011, hypertension, hyperlipidemia, DM 2, PAF on Xarelto, hypothyroidism and tachybradycardia syndrome status post St Jude PPM.  She had three-vessel CABG in 2013 with LIMA to LAD, SVG to OM and SVG to RCA.  EF was normal by echocardiogram.  She underwent PPM implantation in 2017 due to progressive bradycardia.  She had a Myoview in November 2019 that showed ischemia in the OM territory.  Her last cardiac catheterization in December 2019 revealed a 95% ostial stenosis in the SVG to OM.  Given her CKD, stage PCI was recommended.  She ultimately underwent PCI of venous graft in December 2019.  Immediately afterward, she had improvement of chest discomfort.  Her last office visit was in February with Dr. Debara Pickett, at which time she had recurrence of shortness of breath.  This was  felt to be related to elevated blood pressure, she was restarted on amlodipine at 2.5 mg daily.  Based on recent telephone note, she was having recurrence of chest discomfort up to her throat.  She was placed on Imdur 30 mg daily, however this dropped her blood pressure too much and that she had dizziness associated with it.  She has since stopped his Imdur.  Surprisingly, her chest pain has also improved despite coming off of Imdur for a week now.  I did ask her to restart Imdur at 15 mg as a trial to see if she can tolerate it.  She says she only noticed the chest discomfort if she overexerted herself.  It does sound like she has relatively stable angina at this point.  She is not very enthusiastic about considering another cardiac catheterization or stress testing.  I will attempt to use medical management with antianginal medications for the time being.  The patient does not have symptoms concerning for COVID-19 infection (fever, chills, cough, or new shortness of breath).    Past Medical History:  Diagnosis Date   Anemia    Anxiety    Arthritis    "in my hands; knees, back" (09/27/2018)   CAD (coronary artery disease)    a. 40-59% bilaterally 10/2015.   Chronic diastolic CHF (congestive heart failure) ([place])    a. 05/2016 Echo: EF 60-65%, no rwma, Gr1 DD, Ao sclerosis w/o stenosis, triv MR;  b. 07/2016 TEE: EF 55-60%, no rwma, mild MR.   CKD (chronic kidney disease),  stage III [facility])    Coronary artery disease    a. 02/2007 Pers[place] MV: low risk;  b. 11/2011 CABG x 3 (LIMA->LAD, VG->OM, VG->RCA);  c. 05/2016 MV: EF >65%, no isch/infarct, horiz ST dep in I, II, V5-V6.   Depression    Diverticulosis    Esophageal stricture    GERD (gastroesophageal reflux disease)    Hemorrhoids    Hiatal hernia    Hyperkalemia    a. ARB stopped due to this.   Hyperlipidemia    Hypertension    Hypertensive heart disease    Hypothyroidism    Mild cognitive impairment    a. seen by  neurology.   Myocardial infarction ([place]) 12/07/2011   PAF (paroxysmal atrial fibrillation) ([place])    a. post-op CABG.   Pain    RIGHT KNEE PAIN - TORN RIGHT MEDIAL MENISCUS   Paroxysmal atrial flutter ([place])    a. 07/2016 s/p TEE & DCCV;  b. 07/2016 Recurrent PAFlutter req initiation of amio & PPM in setting of tachy-brady;  c. CHA2DS2VASc = 7-->Xarelto 15 mg QD.   Pneumonia    "twice" (09/27/2018)   PONV (postoperative nausea and vomiting)    Presence of permanent cardiac pacemaker 08/12/2016   S/P CABG (coronary artery bypass graft), 12/04/11 12/07/2011   LIMA to LAD, SVG to OM, SVG to RCA   Sinus bradycardia    a. not on BB due to this.   Skin cancer    "face" (09/27/2018)   Tachy-brady syndrome ([place])    a. 07/2016 Jxnl brady following DCCV, recurrent Aflutter-->amio + SJM 2272 Assurity MRI DC PPM (ser # [id number]).   Type II diabetes mellitus ([place])    Past Surgical History:  Procedure Laterality Date   ABDOMINAL HYSTERECTOMY  1980's   ANKLE FRACTURE SURGERY Right    "put pins both side right ankle"   BACK SURGERY  2006   "cyst growing near my spine"   CARDIAC CATHETERIZATION  12/02/2011   mild LV dysfunction with mod hypocontractility of mid-distal anterolateral wall; CAD w/ostial tapering of L [place] with 50% diffuse ostial narrowing of LAD, 99% eccentric focal prox LAD stenosis followed by 70% prox LAD stenosis after 1st diag, 20% mid LAD narrowing; 80% ostial-to-prox L Cfx stenosis & 40-50% irregularity of RCA (Dr. Corky Downs)   CARDIOVERSION N/A 08/11/2016   Procedure: CARDIOVERSION;  Surgeon: Lelon Perla, MD;  Location: [facility];  Service: Cardiovascular;  Laterality: N/A;   CATARACT EXTRACTION W/ INTRAOCULAR LENS  IMPLANT, BILATERAL Bilateral ~ 2010   [place] GRAFT  12/04/2011   Procedure: CORONARY ARTERY BYPASS GRAFTING (CABG);  Surgeon: Tharon Aquas Adelene Idler, MD;  Location: [facility];  Service: Open Heart Surgery;   Laterality: N/A;  CABG x three,  using left internal mammary artery, and right leg greater saphenous vein harvested endoscopically   CORONARY STENT INTERVENTION N/A 09/27/2018   Procedure: CORONARY STENT INTERVENTION;  Surgeon: Troy Sine, MD;  Location: [facility] CV LAB;  Service: Cardiovascular;  Laterality: N/A;   DILATION AND CURETTAGE OF UTERUS     "a couple times"   EP IMPLANTABLE DEVICE N/A 08/12/2016   Procedure: Pacemaker Implant;  Surgeon: Will Meredith Leeds, MD;  Location: [facility] CV LAB;  Service: Cardiovascular;  Laterality: N/A;   ESOPHAGOGASTRODUODENOSCOPY (EGD) WITH ESOPHAGEAL DILATION     FRACTURE SURGERY     JOINT REPLACEMENT     KNEE ARTHROSCOPY WITH MEDIAL MENISECTOMY Right 07/02/2014   Procedure: RIGHT  KNEE ARTHROSCOPY WITH PARTIAL MEDIAL MENISTECTOMY, ABRASION CONDROPLASTYU OF PATELLA,ABRASION CONDROPLASTY OF MEDIAL FEMEROL CONDYL, MICROFRACTURE OF MEDIAL FEMEROL CONDYL;  Surgeon: Tobi Bastos, MD;  Location: [facility];  Service: Orthopedics;  Laterality: Right;   LEFT HEART CATH AND CORS/GRAFTS ANGIOGRAPHY N/A 09/06/2018   Procedure: LEFT HEART CATH AND CORS/GRAFTS ANGIOGRAPHY;  Surgeon: Troy Sine, MD;  Location: [facility] CV LAB;  Service: Cardiovascular;  Laterality: N/A;   LEFT HEART CATHETERIZATION WITH CORONARY ANGIOGRAM N/A 12/02/2011   Procedure: LEFT HEART CATHETERIZATION WITH CORONARY ANGIOGRAM;  Surgeon: Troy Sine, MD;  Location: [facility] CATH LAB;  Service: Cardiovascular;  Laterality: N/A;  Coronary angiogram, possible PCI   TEE WITHOUT CARDIOVERSION N/A 08/11/2016   Procedure: TRANSESOPHAGEAL ECHOCARDIOGRAM (TEE);  Surgeon: Lelon Perla, MD;  Location: [facility];  Service: Cardiovascular;  Laterality: N/A;   [place] Left ~ 2006   TRANSTHORACIC ECHOCARDIOGRAM  02/19/2013   EF 45-80%, grade 1 diastolic dysfunction; mildly thickend/calcified AV leaflets; mildly calcidied MV annulus; mild TR       Current Meds  Medication Sig   amLODipine (NORVASC) 2.5 MG tablet Take 1 tablet (2.5 mg total) by mouth daily.   Ascorbic Acid (VITAMIN C PO) Take 1 tablet by mouth 3 (three) times a week.   carvedilol (COREG) 6.25 MG tablet TAKE 1 TABLET BY MOUTH TWICE DAILY   clopidogrel (PLAVIX) 75 MG tablet Take 1 tablet (75 mg total) by mouth daily.   Cyanocobalamin (VITAMIN B-12 PO) Take 1 tablet by mouth 3 (three) times a week.    docusate sodium (COLACE) 100 MG capsule Take 100 mg by mouth 2 (two) times daily as needed for mild constipation or moderate constipation.   esomeprazole (NEXIUM) 20 MG capsule TAKE 1 CAPSULE BY MOUTH ONCE DAILY AT  12  NOON   ferrous sulfate 325 (65 FE) MG EC tablet Take 325 mg by mouth daily with breakfast.   gabapentin (NEURONTIN) 300 MG capsule Take 300 mg by mouth 2 (two) times daily.    LANTUS SOLOSTAR 100 UNIT/ML Solostar Pen Inject 22 Units into the skin daily.   levothyroxine (SYNTHROID) 88 MCG tablet Take 88 mcg by mouth daily before breakfast.    meclizine (ANTIVERT) 25 MG tablet Take 25 mg by mouth daily as needed for dizziness. For dizziness   Multiple Vitamins-Minerals (PRESERVISION AREDS 2 PO) Take 1 tablet by mouth every evening.    nitroGLYCERIN (NITROSTAT) 0.4 MG SL tablet Place 1 tablet (0.4 mg total) under the tongue every 5 (five) minutes as needed for chest pain.   Polyethyl Glycol-Propyl Glycol (SYSTANE OP) Place 1 drop into both eyes 2 (two) times daily.    Rosuvastatin Calcium 10 MG CPSP Take 10 mg by mouth daily.    torsemide (DEMADEX) 20 MG tablet Take 40 mg by mouth every morning.   XARELTO 15 MG TABS tablet TAKE 1 TABLET BY MOUTH ONCE DAILY WITH SUPPER     Allergies:   Clonidine derivatives; Sulfa antibiotics; Crestor [rosuvastatin calcium]; Epinephrine; Hydralazine; Losartan; and Other   Social History   Tobacco Use   Smoking status: Never Smoker   Smokeless tobacco: Never Used  Substance Use Topics   Alcohol  use: Not Currently    Comment: very occasional    Drug use: Never     Family Hx: The patient's family history includes Breast cancer in her sister and sister; CVA in her mother; Diabetes in her mother and sister; Heart disease in her brother, father,  and sister; Hyperlipidemia in her father; Hypertension in her mother; Lung cancer in her sister. There is no history of Colon cancer.  ROS:   Please see the history of present illness.     All other systems reviewed and are negative.   Prior CV studies:   The following studies were reviewed today:  Cath 09/06/2018  Prox RCA to Mid RCA lesion is 95% stenosed.  Ost LAD to Prox LAD lesion is 100% stenosed.  Ost Cx to Mid Cx lesion is 95% stenosed.  Mid Cx to Dist Cx lesion is 95% stenosed.  Ost 2nd Mrg lesion is 95% stenosed.  Origin lesion is 95% stenosed.  Mid Graft lesion is 50% stenosed.  Mid LAD to Dist LAD lesion is 90% stenosed.  Prox LAD lesion is 90% stenosed.   Severe native CAD with total occlusion of the LAD at the ostium, 95% ostial proximal left circumflex stenosis with 95% bifurcation stenoses involving the AV groove circumflex and OM vessel; and long 95% proximal RCA stenosis.  Patent LIMA graft supplying the LAD with evidence for mid distal focal 90% LAD stenosis beyond the graft insertion and diffuse 90% stenosis of the LAD proximal to the graft insertion.  SVG to circumflex marginal vessel with 95% near ostial stenosis followed by 50% smooth mid stenosis. Patent SVG supplying the distal RCA.  LVEDP 22 mmHg.    Cath 09/27/2018 Prior to intervention, the saphenous graft supplying the circumflex marginal vessel had a 99% ostial/proximal stenosis with thrombus, followed by 70% proximal stenosis on a bend in the vessel.  There was 30% mild irregularity in the midportion of the graft with mild ectasia in the distal aspect of the graft prior to insertion into the marginal vessel.  Following successful  intervention with PTCA and insertion of synergy 2.25 x 12 mm stents both ostially and proximally, both lesions were reduced to 0%.  There was brisk TIMI-3 flow.  There was no evidence for dissection.  RECOMMENDATION: Continued medical therapy for concomitant CAD.  Recommend resumption of Xarelto tomorrow with plans for triple drug therapy for 1 month and then change to Plavix/Xarelto for at least 6 months with then resumption of aspirin/Xarelto.  Labs/Other Tests and Data Reviewed:    EKG:  An ECG dated 10/09/2018 was personally reviewed today and demonstrated:  AV paced rhythm.  Recent Labs: 10/09/2018: BUN 62; Creatinine, Ser 2.55; Hemoglobin 9.2; Platelets 258; Potassium 4.9; Sodium 139   Recent Lipid Panel Lab Results  Component Value Date/Time   CHOL 203 (H) 09/28/2018 04:37 AM   TRIG 320 (H) 09/28/2018 04:37 AM   HDL 25 (L) 09/28/2018 04:37 AM   CHOLHDL 8.1 09/28/2018 04:37 AM   LDLCALC 114 (H) 09/28/2018 04:37 AM    Wt Readings from Last 3 Encounters:  11/27/18 139 lb 3.2 oz (63.1 kg)  10/09/18 141 lb 9.6 oz (64.2 kg)  09/28/18 145 lb 8.1 oz (66 kg)     Objective:    Vital Signs:  BP (!) 145/56    Pulse 60    VITAL SIGNS:  reviewed  ASSESSMENT & PLAN:    1. Chest discomfort: Despite last cardiac catheterization and PCI in December 2019, she has been noticing chest discomfort up to the throat recently.  She was placed on 30 mg of Imdur however this caused her to have dizziness.  She has since stopped Imdur for the past week.  Her chest pain also improved.  I asked her to retry the Imdur at 15 mg daily.  If her chest discomfort continues, she may ended up needing a repeat cardiac catheterization however she is not very enthusiastic about this.  2. CAD s/p CABG in February 2013: Continue carvedilol.  Not on aspirin given the need for Plavix and Xarelto.  3. Hypertension: Blood pressure borderline high today, restart Imdur at 15 mg daily  4. Chronic diastolic heart  failure: Euvolemic without lower extremity edema, orthopnea or PND.  5. CKD stage III: Stable on last lab work.  6. Hyperlipidemia:: On Crestor  7. DM2: Managed by primary care provider  8. PAF on Xarelto: Continue Xarelto  9. Hypothyroidism: On Synthroid.  10. Tachybradycardia syndrome s/p St Jude PPM: Managed by EP  COVID-19 Education: The signs and symptoms of COVID-19 were discussed with the patient and how to seek care for testing (follow up with PCP or arrange E-visit).  The importance of social distancing was discussed today.  Time:   Today, I have spent 8 minutes with the patient with telehealth technology discussing the above problems.     Medication Adjustments/Labs and Tests Ordered: Current medicines are reviewed at length with the patient today.  Concerns regarding medicines are outlined above.   Tests Ordered: No orders of the defined types were placed in this encounter.   Medication Changes: No orders of the defined types were placed in this encounter.   Disposition:  Follow up in 1 month(s)  Signed, Almyra Deforest, [place]  02/11/2019 11:54 PM    [facility]

## 2019-02-09 NOTE — Telephone Encounter (Signed)
Virtual Visit Pre-Appointment Phone Call  Steps For Call:  1. Confirm consent - "In the setting of the current Covid19 crisis, you are scheduled for a VIDEO visit with [name] Deforest, PA-C on 02/09/2019 at 3:00PM.  Just as we do with many in-office visits, in order for you to participate in this visit, we must obtain consent.  If you'd like, I can send this to your mychart (if signed up) or email for you to review.  Otherwise, I can obtain your verbal consent now.  All virtual visits are billed to your insurance company just like a normal visit would be.  By agreeing to a virtual visit, we'd like you to understand that the technology does not allow for your provider to perform an examination, and thus may limit your provider's ability to fully assess your condition. If your provider identifies any concerns that need to be evaluated in person, we will make arrangements to do so.  Finally, though the technology is pretty good, we cannot assure that it will always work on either your or our end, and in the setting of a video visit, we may have to convert it to a phone-only visit.  In either situation, we cannot ensure that we have a secure connection.  Are you willing to proceed?" STAFF: Did the patient verbally acknowledge consent to telehealth visit? Document YES/NO here: YES  2. Confirm the BEST phone number to call the day of the visit by including in appointment notes  3. Give patient instructions for WebEx/MyChart download to smartphone as below or Doximity/Doxy.me if video visit (depending on what platform provider is using)  4. Advise patient to be prepared with their blood pressure, heart rate, weight, any heart rhythm information, their current medicines, and a piece of paper and pen handy for any instructions they may receive the day of their visit  5. Inform patient they will receive a phone call 15 minutes prior to their appointment time (may be from unknown caller ID) so they should be prepared  to answer  6. Confirm that appointment type is correct in Epic appointment notes (VIDEO vs PHONE)     TELEPHONE CALL NOTE  LATAYNA RITCHIE has been deemed a candidate for a follow-up tele-health visit to limit community exposure during the Covid-19 pandemic. I spoke with the patient via phone to ensure availability of phone/video source, confirm preferred email & phone number, and discuss instructions and expectations.  I reminded TELISHA ZAWADZKI to be prepared with any vital sign and/or heart rhythm information that could potentially be obtained via home monitoring, at the time of her visit. I reminded KAPRICE KAGE to expect a phone call at the time of her visit if her visit.  Jacqulynn Cadet, [place] 02/09/2019 2:51 PM   INSTRUCTIONS FOR DOWNLOADING THE [place] APP TO SMARTPHONE  - If Apple, ask patient to go to [employer] and type in WebEx in the search bar. [employer], the blue/green circle. If Android, go to [employer] and type in [employer] in the search bar. The app is free but as with any other app downloads, their phone may require them to verify saved payment information or Apple/Android password.  - The patient does NOT have to create an account. - On the day of the visit, the assist will walk the patient through joining the meeting with the meeting number/password.  INSTRUCTIONS FOR DOWNLOADING THE MYCHART APP TO SMARTPHONE  - The patient must first make sure  to have activated MyChart and know their login information - If Apple, go to [employer] and type in MyChart in the search bar and download the app. If Android, ask patient to go to [employer] and type in [place] in the search bar and download the app. The app is free but as with any other app downloads, their phone may require them to verify saved payment information or Apple/Android password.  - The patient will need to then log into the app with their MyChart username and password, and select [facility] as their healthcare provider to link the account. When it is time for your visit, go to the MyChart app, find appointments, and click Begin Video Visit. Be sure to Select Allow for your device to access the Microphone and Camera for your visit. You will then be connected, and your provider will be with you shortly.  **If they have any issues connecting, or need assistance please contact MyChart service desk [phone])**  **If using a computer, in order to ensure the best quality for their visit they will need to use either of the following Internet Browsers: [employer], or Google Chrome**  IF USING DOXIMITY or DOXY.ME - The patient will receive a link just prior to their visit, either by text or email (to be determined day of appointment depending on if it's doxy.me or Doximity).     FULL LENGTH CONSENT FOR TELE-HEALTH VISIT   I hereby voluntarily request, consent and authorize [facility] and its employed or contracted physicians, physician assistants, nurse practitioners or other licensed health care professionals (the Practitioner), to provide me with telemedicine health care services (the "Services") as deemed necessary by the treating Practitioner. I acknowledge and consent to receive the Services by the Practitioner via telemedicine. I understand that the telemedicine visit will involve communicating with the Practitioner through live audiovisual communication technology and the disclosure of certain medical information by electronic transmission. I acknowledge that I have been given the opportunity to request an in-person assessment or other available alternative prior to the telemedicine visit and am voluntarily participating in the telemedicine visit.  I understand that I have the right to withhold or withdraw my consent to the use of telemedicine in the course of my care at any time, without affecting my right to future care or treatment, and that the  Practitioner or I may terminate the telemedicine visit at any time. I understand that I have the right to inspect all information obtained and/or recorded in the course of the telemedicine visit and may receive copies of available information for a reasonable fee.  I understand that some of the potential risks of receiving the Services via telemedicine include:  Marland Kitchen Delay or interruption in medical evaluation due to technological equipment failure or disruption; . Information transmitted may not be sufficient (e.g. poor resolution of images) to allow for appropriate medical decision making by the Practitioner; and/or  . In rare instances, security protocols could fail, causing a breach of personal health information.  Furthermore, I acknowledge that it is my responsibility to provide information about my medical history, conditions and care that is complete and accurate to the best of my ability. I acknowledge that Practitioner's advice, recommendations, and/or decision may be based on factors not within their control, such as incomplete or inaccurate data provided by me or distortions of diagnostic images or specimens that may result from electronic transmissions. I understand that the practice of medicine is not an exact science  and that Practitioner makes no warranties or guarantees regarding treatment outcomes. I acknowledge that I will receive a copy of this consent concurrently upon execution via email to the email address I last provided but may also request a printed copy by calling the office of [place].    I understand that my insurance will be billed for this visit.   I have read or had this consent read to me. . I understand the contents of this consent, which adequately explains the benefits and risks of the Services being provided via telemedicine.  . I have been provided ample opportunity to ask questions regarding this consent and the Services and have had my questions answered to my  satisfaction. . I give my informed consent for the services to be provided through the use of telemedicine in my medical care  By participating in this telemedicine visit I agree to the above.

## 2019-02-09 NOTE — Patient Instructions (Signed)
Medication Instructions:   RESTART Imdur at 15 mg daily   If you need a refill on your cardiac medications before your next appointment, please call your pharmacy.   Lab work:  NONE ordered at this tome of appointment   If you have labs (blood work) drawn today and your tests are completely normal, you will receive your results only by: Marland Kitchen MyChart Message (if you have MyChart) OR . A paper copy in the mail If you have any lab test that is abnormal or we need to change your treatment, we will call you to review the results.  Testing/Procedures:  NONE ordered at this time of appointment   Follow-Up: At [facility], you and your health needs are our priority.  As part of our continuing mission to provide you with exceptional heart care, we have created designated Provider Care Teams.  These Care Teams include your primary Cardiologist (physician) and Advanced Practice Providers (APPs -  Physician Assistants and Nurse Practitioners) who all work together to provide you with the care you need, when you need it. . You will need a follow up VIRTUAL appointment in 1 months with  [name] Casino, MD   Any Other Special Instructions Will Be Listed Below (If Applicable). Call our office if chest pains become more frequent

## 2019-02-13 ENCOUNTER — Encounter: Payer: Self-pay | Admitting: Cardiology

## 2019-02-13 ENCOUNTER — Telehealth (INDEPENDENT_AMBULATORY_CARE_PROVIDER_SITE_OTHER): Payer: Medicare Other | Admitting: Cardiology

## 2019-02-13 ENCOUNTER — Other Ambulatory Visit: Payer: Self-pay

## 2019-02-13 DIAGNOSIS — Z950 Presence of cardiac pacemaker: Secondary | ICD-10-CM | POA: Diagnosis not present

## 2019-02-13 MED ORDER — CARVEDILOL 12.5 MG PO TABS: 12.5000 mg | ORAL_TABLET | Freq: Two times a day (BID) | ORAL | 3 refills | Status: DC

## 2019-02-13 NOTE — Progress Notes (Signed)
Electrophysiology TeleHealth Note   Due to national recommendations of social distancing due to COVID 19, an audio/video telehealth visit is felt to be most appropriate for this patient at this time.  See Epic message for the patient's consent to telehealth for [facility]   Date:  02/13/2019   ID:  [name], DOB Sep 24, 1941, MRN [id number]  Location: patient's home  Provider location: [address] [place]  Evaluation Performed: Follow-up visit  PCP:  Leeroy Cha, MD  Cardiologist:  [name], MD  Electrophysiologist:  Dr Curt Bears  Chief Complaint: Pacemaker  History of Present Illness:    [name] is a 78 y.o. female who presents via audio/video conferencing for a telehealth visit today.  Since last being seen in our clinic, the patient reports doing very well.  Today, she denies symptoms of palpitations, chest pain, shortness of breath,  lower extremity edema, dizziness, presyncope, or syncope.  The patient is otherwise without complaint today.  The patient denies symptoms of fevers, chills, cough, or new SOB worrisome for COVID 19.    Today, denies symptoms of palpitations, chest pain, shortness of breath, orthopnea, PND, lower extremity edema, claudication, dizziness, presyncope, syncope, bleeding, or neurologic sequela. The patient is tolerating medications without difficulties.  A few months ago, she had stenting of an OM graft due to what appeared to be unstable angina.  This improved her chest pain and shortness of breath, but her chest pain has certainly continued.  She was put on Imdur but this made her feel dizzy.  Her dose was cut back to 15 mg.  She is unfortunately continued to have chest pain.  Past Medical History:  Diagnosis Date  . Anemia   . Anxiety   . Arthritis    "in my hands; knees, back" (09/27/2018)  . CAD (coronary artery disease)    a. 40-59% bilaterally 10/2015.  Marland Kitchen Chronic diastolic CHF (congestive heart  failure) ([facility])    a. 05/2016 Echo: EF 60-65%, no rwma, Gr1 DD, Ao sclerosis w/o stenosis, triv MR;  b. 07/2016 TEE: EF 55-60%, no rwma, mild MR.  . CKD (chronic kidney disease), stage III ([place])   . Coronary artery disease    a. 02/2007 Persantine MV: low risk;  b. 11/2011 CABG x 3 (LIMA->LAD, VG->OM, VG->RCA);  c. 05/2016 MV: EF >65%, no isch/infarct, horiz ST dep in I, II, V5-V6.  Marland Kitchen Depression   . Diverticulosis   . Esophageal stricture   . GERD (gastroesophageal reflux disease)   . Hemorrhoids   . Hiatal hernia   . Hyperkalemia    a. ARB stopped due to this.  . Hyperlipidemia   . Hypertension   . Hypertensive heart disease   . Hypothyroidism   . Mild cognitive impairment    a. seen by neurology.  . Myocardial infarction ([place]) 12/07/2011  . PAF (paroxysmal atrial fibrillation) ([facility])    a. post-op CABG.  . Pain    RIGHT KNEE PAIN - TORN RIGHT MEDIAL MENISCUS  . Paroxysmal atrial flutter ([place])    a. 07/2016 s/p TEE & DCCV;  b. 07/2016 Recurrent PAFlutter req initiation of amio & PPM in setting of tachy-brady;  c. CHA2DS2VASc = 7-->Xarelto 15 mg QD.  Marland Kitchen Pneumonia    "twice" (09/27/2018)  . PONV (postoperative nausea and vomiting)   . Presence of permanent cardiac pacemaker 08/12/2016  . S/P CABG (coronary artery bypass graft), 12/04/11 12/07/2011   LIMA to LAD, SVG to OM, SVG to  RCA  . Sinus bradycardia    a. not on BB due to this.  . Skin cancer    "face" (09/27/2018)  . Tachy-brady syndrome ([place])    a. 07/2016 Jxnl brady following DCCV, recurrent Aflutter-->amio + SJM 2272 Assurity MRI DC PPM (ser # [id number]).  . Type II diabetes mellitus ([place])     Past Surgical History:  Procedure Laterality Date  . ABDOMINAL HYSTERECTOMY  1980's  . ANKLE FRACTURE SURGERY Right    "put pins both side right ankle"  . BACK SURGERY  2006   "cyst growing near my spine"  . CARDIAC CATHETERIZATION  12/02/2011   mild LV dysfunction with mod hypocontractility of mid-distal anterolateral wall; CAD  w/ostial tapering of L [place] with 50% diffuse ostial narrowing of LAD, 99% eccentric focal prox LAD stenosis followed by 70% prox LAD stenosis after 1st diag, 20% mid LAD narrowing; 80% ostial-to-prox L Cfx stenosis & 40-50% irregularity of RCA (Dr. Corky Downs)  . CARDIOVERSION N/A 08/11/2016   Procedure: CARDIOVERSION;  Surgeon: Lelon Perla, MD;  Location: [facility] ENDOSCOPY;  Service: Cardiovascular;  Laterality: N/A;  . CATARACT EXTRACTION W/ INTRAOCULAR LENS  IMPLANT, BILATERAL Bilateral ~ 2010  . [place]  . CORONARY ARTERY BYPASS GRAFT  12/04/2011   Procedure: CORONARY ARTERY BYPASS GRAFTING (CABG);  Surgeon: Tharon Aquas Adelene Idler, MD;  Location: [facility];  Service: Open Heart Surgery;  Laterality: N/A;  CABG x three,  using left internal mammary artery, and right leg greater saphenous vein harvested endoscopically  . CORONARY STENT INTERVENTION N/A 09/27/2018   Procedure: CORONARY STENT INTERVENTION;  Surgeon: Troy Sine, MD;  Location: [facility] CV LAB;  Service: Cardiovascular;  Laterality: N/A;  . DILATION AND CURETTAGE OF UTERUS     "a couple times"  . EP IMPLANTABLE DEVICE N/A 08/12/2016   Procedure: Pacemaker Implant;  Surgeon: Will Meredith Leeds, MD;  Location: [facility] LAB;  Service: Cardiovascular;  Laterality: N/A;  . ESOPHAGOGASTRODUODENOSCOPY (EGD) WITH ESOPHAGEAL DILATION    . FRACTURE SURGERY    . JOINT REPLACEMENT    . KNEE ARTHROSCOPY WITH MEDIAL MENISECTOMY Right 07/02/2014   Procedure: RIGHT KNEE ARTHROSCOPY WITH PARTIAL MEDIAL MENISTECTOMY, ABRASION CONDROPLASTYU OF PATELLA,ABRASION CONDROPLASTY OF MEDIAL FEMEROL CONDYL, MICROFRACTURE OF MEDIAL FEMEROL CONDYL;  Surgeon: Tobi Bastos, MD;  Location: [facility];  Service: Orthopedics;  Laterality: Right;  . LEFT HEART CATH AND CORS/GRAFTS ANGIOGRAPHY N/A 09/06/2018   Procedure: LEFT HEART CATH AND CORS/GRAFTS ANGIOGRAPHY;  Surgeon: Troy Sine, MD;  Location: [facility];  Service: Cardiovascular;   Laterality: N/A;  . LEFT HEART CATHETERIZATION WITH CORONARY ANGIOGRAM N/A 12/02/2011   Procedure: LEFT HEART CATHETERIZATION WITH CORONARY ANGIOGRAM;  Surgeon: Troy Sine, MD;  [facility] C[facility] LAB;  Service: Cardiovascular;  Laterality: N/A;  Coronary angiogram, possible PCI  . TEE WITHOUT CARDIOVERSION N/A 08/11/2016   Procedure: TRANSESOPHAGEAL ECHOCARDIOGRAM (TEE);  Surgeon: Lelon Perla, MD;  Location: [facility];  Service: Cardiovascular;  Laterality: N/A;  . TONSILLECTOMY  1949  . TOTAL KNEE ARTHROPLASTY Left ~ 2006  . TRANSTHORACIC ECHOCARDIOGRAM  02/19/2013   EF 69-62%, grade 1 diastolic dysfunction; mildly thickend/calcified AV leaflets; mildly calcidied MV annulus; mild TR    Current Outpatient Medications  Medication Sig Dispense Refill  . amLODipine (NORVASC) 2.5 MG tablet Take 1 tablet (2.5 mg total) by mouth daily. 90 tablet 3  . Ascorbic Acid (VITAMIN C PO) Take 1 tablet by mouth 3 (three) times a week.    Marland Kitchen  carvedilol (COREG) 6.25 MG tablet TAKE 1 TABLET BY MOUTH TWICE DAILY 60 tablet 11  . clopidogrel (PLAVIX) 75 MG tablet Take 1 tablet (75 mg total) by mouth daily. 90 tablet 4  . Cyanocobalamin (VITAMIN B-12 PO) Take 1 tablet by mouth 3 (three) times a week.     . docusate sodium (COLACE) 100 MG capsule Take 100 mg by mouth 2 (two) times daily as needed for mild constipation or moderate constipation.    Marland Kitchen esomeprazole (NEXIUM) 20 MG capsule TAKE 1 CAPSULE BY MOUTH ONCE DAILY AT  12  NOON 90 capsule 1  . ferrous sulfate 325 (65 FE) MG EC tablet Take 325 mg by mouth daily with breakfast.    . gabapentin (NEURONTIN) 300 MG capsule Take 300 mg by mouth 2 (two) times daily.     . isosorbide mononitrate (IMDUR) 30 MG 24 hr tablet Take 1 tablet (30 mg total) by mouth daily. 90 tablet 3  . LANTUS SOLOSTAR 100 UNIT/ML Solostar Pen Inject 22 Units into the skin daily.  6  . levothyroxine (SYNTHROID) 88 MCG tablet Take 88 mcg by mouth daily before breakfast.     . meclizine  (ANTIVERT) 25 MG tablet Take 25 mg by mouth daily as needed for dizziness. For dizziness    . Multiple Vitamins-Minerals (PRESERVISION AREDS 2 PO) Take 1 tablet by mouth every evening.     Vladimir Faster Glycol-Propyl Glycol (SYSTANE OP) Place 1 drop into both eyes 2 (two) times daily.     . Rosuvastatin Calcium 10 MG CPSP Take 10 mg by mouth daily.     Marland Kitchen torsemide (DEMADEX) 20 MG tablet Take 40 mg by mouth every morning.    Alveda Reasons 15 MG TABS tablet TAKE 1 TABLET BY MOUTH ONCE DAILY WITH SUPPER 90 tablet 0  . nitroGLYCERIN (NITROSTAT) 0.4 MG SL tablet Place 1 tablet (0.4 mg total) under the tongue every 5 (five) minutes as needed for chest pain. 25 tablet 3   No current facility-administered medications for this visit.     Allergies:   Clonidine derivatives; Sulfa antibiotics; Crestor [rosuvastatin calcium]; Epinephrine; Hydralazine; Losartan; and Other   Social History:  The patient  reports that she has never smoked. She has never used smokeless tobacco. She reports previous alcohol use. She reports that she does not use drugs.   Family History:  The patient's  family history includes Breast cancer in her sister and sister; CVA in her mother; Diabetes in her mother and sister; Heart disease in her brother, father, and sister; Hyperlipidemia in her father; Hypertension in her mother; Lung cancer in her sister.   ROS:  Please see the history of present illness.   All other systems are personally reviewed and negative.    Exam:    Vital Signs:  BP (!) 160/61   Pulse 60   Well appearing, alert and conversant, regular work of breathing,  good skin color Eyes- anicteric, neuro- grossly intact, skin- no apparent rash or lesions or cyanosis, mouth- oral mucosa is pink   Labs/Other Tests and Data Reviewed:    Recent Labs: 10/09/2018: BUN 62; Creatinine, Ser 2.55; Hemoglobin 9.2; Platelets 258; Potassium 4.9; Sodium 139   Wt Readings from Last 3 Encounters:  11/27/18 139 lb 3.2 oz (63.1  kg)  10/09/18 141 lb 9.6 oz (64.2 kg)  09/28/18 145 lb 8.1 oz (66 kg)     Other studies personally reviewed: Additional studies/ records that were reviewed today include: ECG 10/09/2018 personally reviewed Review of  the above records today demonstrates: Atrial paced, first-degree AV block   Last device remote is reviewed from [place] PDF dated 11/23/18 which reveals normal device function, no arrhythmias    ASSESSMENT & PLAN:    1.  Tachybradycardia syndrome: Status post [facility] pacemaker 08/13/2016.  Device functioning appropriately.  No changes.  2.  Coronary artery disease: Status post recent PCI to her OM graft.  Unfortunately she has continued to have chest pain despite the use of Imdur.  I will thus plan to increase her Coreg to 12.5 mg.  She is worried that this will drop her blood pressure too much and thus we will stop her low-dose amlodipine.  3.  Paroxysmal atrial fibrillation: Currently on Xarelto.  This patients CHA2DS2-VASc Score and unadjusted Ischemic Stroke Rate (% per year) is equal to 7.2 % stroke rate/year from a score of 5  Above score calculated as 1 point each if present [CHF, HTN, DM, Vascular=MI/PAD/Aortic Plaque, Age if 65-74, or Female] Above score calculated as 2 points each if present [Age > 75, or Stroke/TIA/TE]  4.  Hypertension: Blood pressure is elevated today.  She says that she feels best when her blood pressure is around 140.  She took her blood pressure today prior to taking her blood pressure medications.  We are going to increase her carvedilol as she is having more chest pain.  We will also stop her amlodipine.  5.  Hyperlipidemia: Continue Crestor per primary cardiology.  COVID 19 screen The patient denies symptoms of COVID 19 at this time.  The importance of social distancing was discussed today.  Follow-up: 1 year Next remote: 02/20/19  Current medicines are reviewed at length with the patient today.   The patient does not have  concerns regarding her medicines.  The following changes were made today: Stop amlodipine, increase Coreg to 12.5 mg twice a day  Labs/ tests ordered today include:  No orders of the defined types were placed in this encounter.    Patient Risk:  after full review of this patients clinical status, I feel that they are at moderate risk at this time.  Today, I have spent 12 minutes with the patient with telehealth technology discussing pacemaker, chest pain.    Signed, Will Meredith Leeds, MD  02/13/2019 2:35 PM     [facility] [place] [address] [phone] (office) [phone] (fax)

## 2019-02-13 NOTE — Addendum Note (Signed)
Addended by: Stanton Kidney on: 02/13/2019 02:44 PM   Modules accepted: Orders

## 2019-02-19 ENCOUNTER — Telehealth: Payer: Self-pay | Admitting: Internal Medicine

## 2019-02-19 NOTE — Telephone Encounter (Signed)
He contacted me with the changes- I think they are reasonable changes to try given her symptoms and elevated blood pressure. I agree.  Dr Lemmie Evens

## 2019-02-19 NOTE — Telephone Encounter (Signed)
Left message to call back  

## 2019-02-19 NOTE — Telephone Encounter (Signed)
° ° ° ° °  Pt c/o medication issue:  1. Name of Medication: carvedilol (COREG) 12.5 MG tablet  2. How are you currently taking this medication (dosage and times per day)?   3. Are you having a reaction (difficulty breathing--STAT)? no  4. What is your medication issue? Patient's daughter calling, she has questions about dosage change made by Dr Curt Bears.

## 2019-02-19 NOTE — Telephone Encounter (Signed)
DPR on file. Spoke with pt daughter [name] who requests that Dr. Debara Pickett review med changes made during appts on 4/17 and 4/21. Pt instructed to restart Imdur at 15 mg daily during 4/17 appt and to increase carvedilol to 12.5 mg BID and stop amlodipine during 4/21 appt.   Pt daughter states that Dr. Debara Pickett more familiar with pt Hx esp concerning sensitivity to med adjustments. Would like Dr. Debara Pickett to advise.

## 2019-02-20 ENCOUNTER — Other Ambulatory Visit: Payer: Self-pay

## 2019-02-20 ENCOUNTER — Ambulatory Visit (INDEPENDENT_AMBULATORY_CARE_PROVIDER_SITE_OTHER): Payer: Medicare Other | Admitting: *Deleted

## 2019-02-20 DIAGNOSIS — I495 Sick sinus syndrome: Secondary | ICD-10-CM

## 2019-02-20 DIAGNOSIS — I5032 Chronic diastolic (congestive) heart failure: Secondary | ICD-10-CM | POA: Diagnosis not present

## 2019-02-20 LAB — CUP PACEART REMOTE DEVICE CHECK
Battery Remaining Longevity: 120 mo
Battery Remaining Percentage: 95.5 %
Battery Voltage: 2.99 V
Brady Statistic AP VP Percent: 1 %
Brady Statistic AP VS Percent: 97 %
Brady Statistic AS VP Percent: 1 %
Brady Statistic AS VS Percent: 1 %
Brady Statistic RA Percent Paced: 96 %
Brady Statistic RV Percent Paced: 1 %
Date Time Interrogation Session: 20200428085416
Implantable Lead Implant Date: 20171019
Implantable Lead Implant Date: 20171019
Implantable Lead Location: 753859
Implantable Lead Location: 753860
Implantable Pulse Generator Implant Date: 20171019
Lead Channel Impedance Value: 400 Ohm
Lead Channel Impedance Value: 750 Ohm
Lead Channel Pacing Threshold Amplitude: 0.5 V
Lead Channel Pacing Threshold Amplitude: 1 V
Lead Channel Pacing Threshold Pulse Width: 0.4 ms
Lead Channel Pacing Threshold Pulse Width: 0.4 ms
Lead Channel Sensing Intrinsic Amplitude: 12 mV
Lead Channel Sensing Intrinsic Amplitude: 5 mV
Lead Channel Setting Pacing Amplitude: 2 V
Lead Channel Setting Pacing Amplitude: 2.5 V
Lead Channel Setting Pacing Pulse Width: 0.4 ms
Lead Channel Setting Sensing Sensitivity: 2 mV
Pulse Gen Model: 2272
Pulse Gen Serial Number: 3180458

## 2019-02-20 NOTE — Telephone Encounter (Signed)
Daughter updated and voiced understanding.  

## 2019-02-28 NOTE — Progress Notes (Signed)
Remote pacemaker transmission.   

## 2019-03-14 ENCOUNTER — Other Ambulatory Visit: Payer: Self-pay | Admitting: Internal Medicine

## 2019-03-14 NOTE — Telephone Encounter (Signed)
78yo, 139 lbs, Scr 2.55 on 10/09/18, Crcl 46ml/min Last OV 02/09/19 Indication Afib

## 2019-03-20 ENCOUNTER — Telehealth: Payer: Self-pay | Admitting: Internal Medicine

## 2019-03-20 NOTE — Telephone Encounter (Signed)
Pt called to report that she has been having increased SOB with exertion.. she says she can still walk a block but if she walks fast she cannot catch her breath.. she denies chest pain, dizziness, no peripheral edema..her BP has been consistent at 145/50 and HR 60.. she can still lay flat at night to sleep.   Pt says she feels she has increased fluid in her body.Marland Kitchen  Her weight is up about 3 lbs over the past 4-5 days 140lbs... she has not altered her diet to watch for added NA.   Last OV was 02/09/19 with Almyra Deforest PA.   Will forward to Dr. Josepha Pigg

## 2019-03-20 NOTE — Telephone Encounter (Signed)
New  Message           Patient is calling today to discuss some fluid build up

## 2019-03-21 NOTE — Telephone Encounter (Signed)
Patient scheduled for OV with MD 03/23/2019

## 2019-03-21 NOTE — Telephone Encounter (Signed)
Can schedule for OV on Friday, early afternoon with me. Eliezer Lofts, can you schedule?  Dr. Lemmie Evens

## 2019-03-21 NOTE — Telephone Encounter (Signed)
Spoke with patient to schedule her for IN OFFICE visit with Dr. Debara Pickett on Friday 5/29. She needs to call her daughter to see if she can bring her in for an appointment and will call us back. Patient can be added on to Dr. Lysbeth Penner PM DOD schedule.

## 2019-03-23 ENCOUNTER — Ambulatory Visit: Payer: Medicare Other | Admitting: Internal Medicine

## 2019-05-14 ENCOUNTER — Other Ambulatory Visit: Payer: Self-pay

## 2019-05-14 ENCOUNTER — Inpatient Hospital Stay (HOSPITAL_COMMUNITY)
Admission: EM | Admit: 2019-05-14 | Discharge: 2019-05-22 | DRG: 871 | Disposition: A | Discharge status: Home Health | Payer: Medicare Other | Attending: Nephrology | Admitting: Nephrology

## 2019-05-14 ENCOUNTER — Encounter (HOSPITAL_COMMUNITY): Payer: Self-pay | Admitting: Emergency Medicine

## 2019-05-14 ENCOUNTER — Observation Stay (HOSPITAL_COMMUNITY): Payer: Medicare Other

## 2019-05-14 ENCOUNTER — Emergency Department (HOSPITAL_COMMUNITY): Payer: Medicare Other

## 2019-05-14 DIAGNOSIS — E1169 Type 2 diabetes mellitus with other specified complication: Secondary | ICD-10-CM | POA: Diagnosis present

## 2019-05-14 DIAGNOSIS — A411 Sepsis due to other specified staphylococcus: Principal | ICD-10-CM | POA: Diagnosis present

## 2019-05-14 DIAGNOSIS — M1990 Unspecified osteoarthritis, unspecified site: Secondary | ICD-10-CM | POA: Diagnosis present

## 2019-05-14 DIAGNOSIS — Z9842 Cataract extraction status, left eye: Secondary | ICD-10-CM

## 2019-05-14 DIAGNOSIS — I152 Hypertension secondary to endocrine disorders: Secondary | ICD-10-CM | POA: Diagnosis present

## 2019-05-14 DIAGNOSIS — E1122 Type 2 diabetes mellitus with diabetic chronic kidney disease: Secondary | ICD-10-CM | POA: Diagnosis present

## 2019-05-14 DIAGNOSIS — E039 Hypothyroidism, unspecified: Secondary | ICD-10-CM | POA: Diagnosis present

## 2019-05-14 DIAGNOSIS — Z803 Family history of malignant neoplasm of breast: Secondary | ICD-10-CM

## 2019-05-14 DIAGNOSIS — I10 Essential (primary) hypertension: Secondary | ICD-10-CM | POA: Diagnosis present

## 2019-05-14 DIAGNOSIS — Z950 Presence of cardiac pacemaker: Secondary | ICD-10-CM | POA: Diagnosis present

## 2019-05-14 DIAGNOSIS — N184 Chronic kidney disease, stage 4 (severe): Secondary | ICD-10-CM | POA: Diagnosis present

## 2019-05-14 DIAGNOSIS — Z888 Allergy status to other drugs, medicaments and biological substances status: Secondary | ICD-10-CM

## 2019-05-14 DIAGNOSIS — D696 Thrombocytopenia, unspecified: Secondary | ICD-10-CM | POA: Diagnosis present

## 2019-05-14 DIAGNOSIS — I495 Sick sinus syndrome: Secondary | ICD-10-CM | POA: Diagnosis present

## 2019-05-14 DIAGNOSIS — F0390 Unspecified dementia without behavioral disturbance: Secondary | ICD-10-CM | POA: Diagnosis present

## 2019-05-14 DIAGNOSIS — E119 Type 2 diabetes mellitus without complications: Secondary | ICD-10-CM

## 2019-05-14 DIAGNOSIS — Z90710 Acquired absence of both cervix and uterus: Secondary | ICD-10-CM

## 2019-05-14 DIAGNOSIS — Z794 Long term (current) use of insulin: Secondary | ICD-10-CM

## 2019-05-14 DIAGNOSIS — D631 Anemia in chronic kidney disease: Secondary | ICD-10-CM | POA: Diagnosis present

## 2019-05-14 DIAGNOSIS — I5033 Acute on chronic diastolic (congestive) heart failure: Secondary | ICD-10-CM | POA: Diagnosis present

## 2019-05-14 DIAGNOSIS — F329 Major depressive disorder, single episode, unspecified: Secondary | ICD-10-CM | POA: Diagnosis present

## 2019-05-14 DIAGNOSIS — Z955 Presence of coronary angioplasty implant and graft: Secondary | ICD-10-CM

## 2019-05-14 DIAGNOSIS — Z20828 Contact with and (suspected) exposure to other viral communicable diseases: Secondary | ICD-10-CM | POA: Diagnosis present

## 2019-05-14 DIAGNOSIS — R509 Fever, unspecified: Secondary | ICD-10-CM | POA: Diagnosis not present

## 2019-05-14 DIAGNOSIS — I081 Rheumatic disorders of both mitral and tricuspid valves: Secondary | ICD-10-CM | POA: Diagnosis present

## 2019-05-14 DIAGNOSIS — M25552 Pain in left hip: Secondary | ICD-10-CM

## 2019-05-14 DIAGNOSIS — H9190 Unspecified hearing loss, unspecified ear: Secondary | ICD-10-CM | POA: Diagnosis present

## 2019-05-14 DIAGNOSIS — Z801 Family history of malignant neoplasm of trachea, bronchus and lung: Secondary | ICD-10-CM

## 2019-05-14 DIAGNOSIS — I2510 Atherosclerotic heart disease of native coronary artery without angina pectoris: Secondary | ICD-10-CM

## 2019-05-14 DIAGNOSIS — R140 Abdominal distension (gaseous): Secondary | ICD-10-CM

## 2019-05-14 DIAGNOSIS — Z6827 Body mass index (BMI) 27.0-27.9, adult: Secondary | ICD-10-CM

## 2019-05-14 DIAGNOSIS — R627 Adult failure to thrive: Secondary | ICD-10-CM | POA: Diagnosis present

## 2019-05-14 DIAGNOSIS — Z7902 Long term (current) use of antithrombotics/antiplatelets: Secondary | ICD-10-CM

## 2019-05-14 DIAGNOSIS — R4182 Altered mental status, unspecified: Secondary | ICD-10-CM

## 2019-05-14 DIAGNOSIS — D62 Acute posthemorrhagic anemia: Secondary | ICD-10-CM | POA: Diagnosis not present

## 2019-05-14 DIAGNOSIS — I5032 Chronic diastolic (congestive) heart failure: Secondary | ICD-10-CM | POA: Diagnosis present

## 2019-05-14 DIAGNOSIS — B957 Other staphylococcus as the cause of diseases classified elsewhere: Secondary | ICD-10-CM | POA: Diagnosis present

## 2019-05-14 DIAGNOSIS — D638 Anemia in other chronic diseases classified elsewhere: Secondary | ICD-10-CM | POA: Diagnosis present

## 2019-05-14 DIAGNOSIS — Z96652 Presence of left artificial knee joint: Secondary | ICD-10-CM | POA: Diagnosis present

## 2019-05-14 DIAGNOSIS — Z79890 Hormone replacement therapy (postmenopausal): Secondary | ICD-10-CM

## 2019-05-14 DIAGNOSIS — Z882 Allergy status to sulfonamides status: Secondary | ICD-10-CM

## 2019-05-14 DIAGNOSIS — Z8249 Family history of ischemic heart disease and other diseases of the circulatory system: Secondary | ICD-10-CM

## 2019-05-14 DIAGNOSIS — R7881 Bacteremia: Secondary | ICD-10-CM | POA: Diagnosis present

## 2019-05-14 DIAGNOSIS — K661 Hemoperitoneum: Secondary | ICD-10-CM | POA: Diagnosis not present

## 2019-05-14 DIAGNOSIS — Z9841 Cataract extraction status, right eye: Secondary | ICD-10-CM

## 2019-05-14 DIAGNOSIS — E1159 Type 2 diabetes mellitus with other circulatory complications: Secondary | ICD-10-CM | POA: Diagnosis present

## 2019-05-14 DIAGNOSIS — IMO0001 Reserved for inherently not codable concepts without codable children: Secondary | ICD-10-CM

## 2019-05-14 DIAGNOSIS — Z7901 Long term (current) use of anticoagulants: Secondary | ICD-10-CM

## 2019-05-14 DIAGNOSIS — Z823 Family history of stroke: Secondary | ICD-10-CM

## 2019-05-14 DIAGNOSIS — Z91018 Allergy to other foods: Secondary | ICD-10-CM

## 2019-05-14 DIAGNOSIS — Z833 Family history of diabetes mellitus: Secondary | ICD-10-CM

## 2019-05-14 DIAGNOSIS — W5503XA Scratched by cat, initial encounter: Secondary | ICD-10-CM

## 2019-05-14 DIAGNOSIS — K219 Gastro-esophageal reflux disease without esophagitis: Secondary | ICD-10-CM | POA: Diagnosis present

## 2019-05-14 DIAGNOSIS — G9340 Encephalopathy, unspecified: Secondary | ICD-10-CM | POA: Diagnosis present

## 2019-05-14 DIAGNOSIS — Z961 Presence of intraocular lens: Secondary | ICD-10-CM | POA: Diagnosis present

## 2019-05-14 DIAGNOSIS — K683 Retroperitoneal hematoma: Secondary | ICD-10-CM | POA: Diagnosis not present

## 2019-05-14 DIAGNOSIS — I130 Hypertensive heart and chronic kidney disease with heart failure and stage 1 through stage 4 chronic kidney disease, or unspecified chronic kidney disease: Secondary | ICD-10-CM | POA: Diagnosis present

## 2019-05-14 DIAGNOSIS — G9341 Metabolic encephalopathy: Secondary | ICD-10-CM | POA: Diagnosis present

## 2019-05-14 DIAGNOSIS — M6289 Other specified disorders of muscle: Secondary | ICD-10-CM | POA: Diagnosis present

## 2019-05-14 DIAGNOSIS — Z452 Encounter for adjustment and management of vascular access device: Secondary | ICD-10-CM

## 2019-05-14 DIAGNOSIS — Z9861 Coronary angioplasty status: Secondary | ICD-10-CM

## 2019-05-14 DIAGNOSIS — I252 Old myocardial infarction: Secondary | ICD-10-CM

## 2019-05-14 DIAGNOSIS — B9689 Other specified bacterial agents as the cause of diseases classified elsewhere: Secondary | ICD-10-CM | POA: Diagnosis present

## 2019-05-14 DIAGNOSIS — Z79899 Other long term (current) drug therapy: Secondary | ICD-10-CM

## 2019-05-14 DIAGNOSIS — Z85828 Personal history of other malignant neoplasm of skin: Secondary | ICD-10-CM

## 2019-05-14 DIAGNOSIS — Z8349 Family history of other endocrine, nutritional and metabolic diseases: Secondary | ICD-10-CM

## 2019-05-14 DIAGNOSIS — F419 Anxiety disorder, unspecified: Secondary | ICD-10-CM | POA: Diagnosis present

## 2019-05-14 DIAGNOSIS — E785 Hyperlipidemia, unspecified: Secondary | ICD-10-CM | POA: Diagnosis present

## 2019-05-14 DIAGNOSIS — R6510 Systemic inflammatory response syndrome (SIRS) of non-infectious origin without acute organ dysfunction: Secondary | ICD-10-CM | POA: Diagnosis present

## 2019-05-14 DIAGNOSIS — Z951 Presence of aortocoronary bypass graft: Secondary | ICD-10-CM

## 2019-05-14 DIAGNOSIS — I480 Paroxysmal atrial fibrillation: Secondary | ICD-10-CM | POA: Diagnosis present

## 2019-05-14 LAB — CBC WITH DIFFERENTIAL/PLATELET
Abs Immature Granulocytes: 0.02 10*3/uL (ref 0.00–0.07)
Basophils Absolute: 0 10*3/uL (ref 0.0–0.1)
Basophils Relative: 0 %
Eosinophils Absolute: 0 10*3/uL (ref 0.0–0.5)
Eosinophils Relative: 0 %
HCT: 30 % — ABNORMAL LOW (ref 36.0–46.0)
Hemoglobin: 9.8 g/dL — ABNORMAL LOW (ref 12.0–15.0)
Immature Granulocytes: 0 %
Lymphocytes Relative: 9 %
Lymphs Abs: 0.7 10*3/uL (ref 0.7–4.0)
MCH: 32.2 pg (ref 26.0–34.0)
MCHC: 32.7 g/dL (ref 30.0–36.0)
MCV: 98.7 fL (ref 80.0–100.0)
Monocytes Absolute: 0.5 10*3/uL (ref 0.1–1.0)
Monocytes Relative: 6 %
Neutro Abs: 6.4 10*3/uL (ref 1.7–7.7)
Neutrophils Relative %: 85 %
Platelets: 152 10*3/uL (ref 150–400)
RBC: 3.04 MIL/uL — ABNORMAL LOW (ref 3.87–5.11)
RDW: 12.7 % (ref 11.5–15.5)
WBC: 7.7 10*3/uL (ref 4.0–10.5)
nRBC: 0 % (ref 0.0–0.2)

## 2019-05-14 LAB — COMPREHENSIVE METABOLIC PANEL
ALT: 17 U/L (ref 0–44)
AST: 30 U/L (ref 15–41)
Albumin: 4.1 g/dL (ref 3.5–5.0)
Alkaline Phosphatase: 69 U/L (ref 38–126)
Anion gap: 12 (ref 5–15)
BUN: 59 mg/dL — ABNORMAL HIGH (ref 8–23)
CO2: 22 mmol/L (ref 22–32)
Calcium: 8.7 mg/dL — ABNORMAL LOW (ref 8.9–10.3)
Chloride: 103 mmol/L (ref 98–111)
Creatinine, Ser: 2.18 mg/dL — ABNORMAL HIGH (ref 0.44–1.00)
GFR calc Af Amer: 24 mL/min — ABNORMAL LOW (ref >60)
GFR calc non Af Amer: 21 mL/min — ABNORMAL LOW (ref >60)
Glucose, Bld: 197 mg/dL — ABNORMAL HIGH (ref 70–99)
Potassium: 4.3 mmol/L (ref 3.5–5.1)
Sodium: 137 mmol/L (ref 135–145)
Total Bilirubin: 0.8 mg/dL (ref 0.3–1.2)
Total Protein: 7.6 g/dL (ref 6.5–8.1)

## 2019-05-14 LAB — URINALYSIS, ROUTINE W REFLEX MICROSCOPIC
Bilirubin Urine: NEGATIVE
Glucose, UA: NEGATIVE mg/dL
Hgb urine dipstick: NEGATIVE
Ketones, ur: NEGATIVE mg/dL
Leukocytes,Ua: NEGATIVE
Nitrite: NEGATIVE
Protein, ur: NEGATIVE mg/dL
Specific Gravity, Urine: 1.008 (ref 1.005–1.030)
pH: 5 (ref 5.0–8.0)

## 2019-05-14 LAB — LACTIC ACID, PLASMA: Lactic Acid, Venous: 1.1 mmol/L (ref 0.5–1.9)

## 2019-05-14 LAB — SARS CORONAVIRUS 2 BY RT PCR (HOSPITAL ORDER, PERFORMED IN ~~LOC~~ HOSPITAL LAB): SARS Coronavirus 2: NEGATIVE

## 2019-05-14 MED ORDER — SODIUM CHLORIDE 0.9 % IV SOLN
1.0000 g | INTRAVENOUS | Status: DC
  Administered 2019-05-14 – 2019-05-15 (×2): 1 g via INTRAVENOUS
  Filled 2019-05-14 (×4): qty 10

## 2019-05-14 MED ORDER — SODIUM CHLORIDE 0.9 % IV BOLUS (SEPSIS)
500.0000 mL | Freq: Once | INTRAVENOUS | Status: AC
  Administered 2019-05-14: 500 mL via INTRAVENOUS

## 2019-05-14 MED ORDER — ACETAMINOPHEN 325 MG PO TABS
650.0000 mg | ORAL_TABLET | Freq: Once | ORAL | Status: AC
  Administered 2019-05-14: 650 mg via ORAL
  Filled 2019-05-14: qty 2

## 2019-05-14 MED ORDER — SODIUM CHLORIDE 0.9 % IV BOLUS (SEPSIS)
250.0000 mL | Freq: Once | INTRAVENOUS | Status: AC
  Administered 2019-05-14: 250 mL via INTRAVENOUS

## 2019-05-14 MED ORDER — SODIUM CHLORIDE 0.9 % IV BOLUS (SEPSIS)
1000.0000 mL | Freq: Once | INTRAVENOUS | Status: AC
  Administered 2019-05-14: 1000 mL via INTRAVENOUS

## 2019-05-14 NOTE — ED Triage Notes (Signed)
Pt  From home c/o of generalized weakness with fever and confusion that started this AM.

## 2019-05-14 NOTE — ED Provider Notes (Signed)
[facility] DEPT Provider Note   CSN: [id number] Arrival date & time: 05/14/19  2014    History   Chief Complaint Chief Complaint  Patient presents with  . Fever  . Weakness    HPI [name] is a 78 y.o. female.     HPI   She presents for evaluation of weakness, by her report.  Earlier today the patient contacted her cardiologist regarding shortness of breath with exertion.  Currently she denies shortness of breath.  Earlier today she reported that her weight was up 3 pounds, from her baseline to 140 pounds.  She has history of congestive heart failure.  She denies chest pain, cough, nausea, vomiting, dysuria, urinary frequency or diarrhea.  There are no other known modifying factors.  Past Medical History:  Diagnosis Date  . Anemia   . Anxiety   . Arthritis    "in my hands; knees, back" (09/27/2018)  . CAD (coronary artery disease)    a. 40-59% bilaterally 10/2015.  Marland Kitchen Chronic diastolic CHF (congestive heart failure) ([place])    a. 05/2016 Echo: EF 60-65%, no rwma, Gr1 DD, Ao sclerosis w/o stenosis, triv MR;  b. 07/2016 TEE: EF 55-60%, no rwma, mild MR.  . CKD (chronic kidney disease), stage III ([place])   . Coronary artery disease    a. 02/2007 Persantine MV: low risk;  b. 11/2011 CABG x 3 (LIMA->LAD, VG->OM, VG->RCA);  c. 05/2016 MV: EF >65%, no isch/infarct, horiz ST dep in I, II, V5-V6.  Marland Kitchen Depression   . Diverticulosis   . Esophageal stricture   . GERD (gastroesophageal reflux disease)   . Hemorrhoids   . Hiatal hernia   . Hyperkalemia    a. ARB stopped due to this.  . Hyperlipidemia   . Hypertension   . Hypertensive heart disease   . Hypothyroidism   . Mild cognitive impairment    a. seen by neurology.  . Myocardial infarction ([place]) 12/07/2011  . PAF (paroxysmal atrial fibrillation) (HCC)    a. post-op CABG.  . Pain    RIGHT KNEE PAIN - TORN RIGHT MEDIAL MENISCUS  . Paroxysmal atrial flutter ([place])    a. 07/2016 s/p TEE & DCCV;  b.  07/2016 Recurrent PAFlutter req initiation of amio & PPM in setting of tachy-brady;  c. CHA2DS2VASc = 7-->Xarelto 15 mg QD.  Marland Kitchen Pneumonia    "twice" (09/27/2018)  . PONV (postoperative nausea and vomiting)   . Presence of permanent cardiac pacemaker 08/12/2016  . S/P CABG (coronary artery bypass graft), 12/04/11 12/07/2011   LIMA to LAD, SVG to OM, SVG to RCA  . Sinus bradycardia    a. not on BB due to this.  . Skin cancer    "face" (09/27/2018)  . Tachy-brady syndrome ([place])    a. 07/2016 Jxnl brady following DCCV, recurrent Aflutter-->amio + SJM 2272 Assurity MRI DC PPM (ser # [id number]).  . Type II diabetes mellitus [facility])     Patient Active Problem List   Diagnosis Date Noted  . Status post coronary artery stent placement   . CAD S/P percutaneous coronary angioplasty 09/27/2018  . Abnormal nuclear stress test   . Medication management 08/25/2017  . On anticoagulant therapy 04/22/2017  . Tachycardia-bradycardia syndrome ([place]) 12/23/2016  . Cardiac pacemaker in situ 09/22/2016  . Accelerated hypertension   . Chronic diastolic CHF (congestive heart failure) ([place]) 08/09/2016  . Elevated troponin 08/09/2016  . Hypertensive urgency 08/08/2016  . Paroxysmal atrial flutter ([place]) 08/08/2016  . Coronary artery  disease   . Hypertensive heart disease   . CKD (chronic kidney disease) stage 4, GFR 15-29 ml/min (HCC)   . Other specified hypothyroidism   . Anemia of chronic disease   . Pain in the chest   . Shortness of breath 05/23/2016  . Pre-syncope 05/23/2016  . Memory loss 03/14/2015  . Chest tightness 09/16/2014  . Osteoarthritis of right knee 07/02/2014  . Acute medial meniscus tear of right knee 07/02/2014  . Diabetic peripheral neuropathy associated with type 1 diabetes mellitus ([place]) 09/05/2013  . Edema of extremities 04/18/2013  . Cellulitis 04/18/2013  . Essential hypertension 01/06/2012  . Acute renal failure,admitted 01/03/12, after admission for diastolic chf 16/07/9603  . S/P  CABG (coronary artery bypass graft), 12/04/11 12/07/2011  . Hyperlipidemia LDL goal <70 12/03/2011    Class: Diagnosis of  . NSTEMI (non-ST elevated myocardial infarction) ([place]) 12/01/2011  . Family history of early CAD 12/01/2011  . Diabetes mellitus, type 2 ([place]) 12/14/2010  . DYSPHAGIA UNSPECIFIED 12/14/2010  . ABDOMINAL PAIN, EPIGASTRIC 12/14/2010  . ESOPHAGEAL STRICTURE 11/28/2009  . GERD 11/28/2009  . FLATULENCE-GAS-BLOATING 11/28/2009  . CONSTIPATION, CHRONIC 12/19/2007  . SALPINGO-OOPHORITIS 12/19/2007  . MENOPAUSE, SURGICAL 12/19/2007  . BACK PAIN, LUMBAR 12/19/2007  . ANKLE INJURY, RIGHT 12/19/2007  . TOTAL KNEE REPLACEMENT, LEFT, HX OF 12/19/2007    Past Surgical History:  Procedure Laterality Date  . ABDOMINAL HYSTERECTOMY  1980's  . ANKLE FRACTURE SURGERY Right    "put pins both side right ankle"  . BACK SURGERY  2006   "cyst growing near my spine"  . CARDIAC CATHETERIZATION  12/02/2011   mild LV dysfunction with mod hypocontractility of mid-distal anterolateral wall; CAD w/ostial tapering of L [place] with 50% diffuse ostial narrowing of LAD, 99% eccentric focal prox LAD stenosis followed by 70% prox LAD stenosis after 1st diag, 20% mid LAD narrowing; 80% ostial-to-prox L Cfx stenosis & 40-50% irregularity of RCA (Dr. Corky Downs)  . CARDIOVERSION N/A 08/11/2016   Procedure: CARDIOVERSION;  Surgeon: Lelon Perla, MD;  Location: [facility] ENDOSCOPY;  Service: Cardiovascular;  Laterality: N/A;  . CATARACT EXTRACTION W/ INTRAOCULAR LENS  IMPLANT, BILATERAL Bilateral ~ 2010  . [place]  . CORONARY ARTERY BYPASS GRAFT  12/04/2011   Procedure: CORONARY ARTERY BYPASS GRAFTING (CABG);  Surgeon: Tharon Aquas Adelene Idler, MD;  Location: [facility];  Service: Open Heart Surgery;  Laterality: N/A;  CABG x three,  using left internal mammary artery, and right leg greater saphenous vein harvested endoscopically  . CORONARY STENT INTERVENTION N/A 09/27/2018   Procedure: CORONARY STENT  INTERVENTION;  Surgeon: Troy Sine, MD;  Location: [facility] CV LAB;  Service: Cardiovascular;  Laterality: N/A;  . DILATION AND CURETTAGE OF UTERUS     "a couple times"  . EP IMPLANTABLE DEVICE N/A 08/12/2016   Procedure: Pacemaker Implant;  Surgeon: Will Meredith Leeds, MD;  Location: [facility] CV LAB;  Service: Cardiovascular;  Laterality: N/A;  . ESOPHAGOGASTRODUODENOSCOPY (EGD) WITH ESOPHAGEAL DILATION    . FRACTURE SURGERY    . JOINT REPLACEMENT    . KNEE ARTHROSCOPY WITH MEDIAL MENISECTOMY Right 07/02/2014   Procedure: RIGHT KNEE ARTHROSCOPY WITH PARTIAL MEDIAL MENISTECTOMY, ABRASION CONDROPLASTYU OF PATELLA,ABRASION CONDROPLASTY OF MEDIAL FEMEROL CONDYL, MICROFRACTURE OF MEDIAL FEMEROL CONDYL;  Surgeon: Tobi Bastos, MD;  Location: [facility] ORS;  Service: Orthopedics;  Laterality: Right;  . LEFT HEART CATH AND CORS/GRAFTS ANGIOGRAPHY N/A 09/06/2018   Procedure: LEFT HEART CATH AND CORS/GRAFTS ANGIOGRAPHY;  Surgeon: Troy Sine,  MD;  Location: [facility] CV LAB;  Service: Cardiovascular;  Laterality: N/A;  . LEFT HEART CATHETERIZATION WITH CORONARY ANGIOGRAM N/A 12/02/2011   Procedure: LEFT HEART CATHETERIZATION WITH CORONARY ANGIOGRAM;  Surgeon: Troy Sine, MD;  Location: [facility] CATH LAB;  Service: Cardiovascular;  Laterality: N/A;  Coronary angiogram, possible PCI  . TEE WITHOUT CARDIOVERSION N/A 08/11/2016   Procedure: TRANSESOPHAGEAL ECHOCARDIOGRAM (TEE);  Surgeon: Lelon Perla, MD;  Location: [facility];  Service: Cardiovascular;  Laterality: N/A;  . TONSILLECTOMY  1949  . TOTAL KNEE ARTHROPLASTY Left ~ 2006  . TRANSTHORACIC ECHOCARDIOGRAM  02/19/2013   EF 51-70%, grade 1 diastolic dysfunction; mildly thickend/calcified AV leaflets; mildly calcidied MV annulus; mild TR     OB History   No obstetric history on file.      Home Medications    Prior to Admission medications   Medication Sig Start Date End Date Taking? Authorizing Provider  acetaminophen (TYLENOL)  650 MG CR tablet Take 650 mg by mouth every 8 (eight) hours as needed for pain.   Yes [provider]  Ascorbic Acid (VITAMIN C PO) Take 1 tablet by mouth 3 (three) times a week.   Yes [provider]  carvedilol (COREG) 12.5 MG tablet Take 1 tablet (12.5 mg total) by mouth 2 (two) times daily. 02/13/19 05/14/19 Yes Camnitz, Will Hassell Done, MD  clopidogrel (PLAVIX) 75 MG tablet Take 1 tablet (75 mg total) by mouth daily. 09/06/18 09/06/19 Yes Kathyrn Drown D, NP  Cyanocobalamin (VITAMIN B-12 PO) Take 1 tablet by mouth 3 (three) times a week.    Yes [provider]  docusate sodium (COLACE) 100 MG capsule Take 100 mg by mouth 2 (two) times daily as needed for mild constipation or moderate constipation.   Yes [provider]  ferrous sulfate 325 (65 FE) MG EC tablet Take 325 mg by mouth daily with breakfast.   Yes [provider]  gabapentin (NEURONTIN) 300 MG capsule Take 300 mg by mouth 2 (two) times daily.    Yes [provider]  LANTUS SOLOSTAR 100 UNIT/ML Solostar Pen Inject 22 Units into the skin daily. 08/08/18  Yes [provider]  levothyroxine (SYNTHROID) 88 MCG tablet Take 88 mcg by mouth daily before breakfast.    Yes [provider]  meclizine (ANTIVERT) 25 MG tablet Take 25 mg by mouth daily as needed for dizziness. For dizziness   Yes [provider]  Multiple Vitamins-Minerals (PRESERVISION AREDS 2 PO) Take 1 tablet by mouth every evening.    Yes [provider]  nitroGLYCERIN (NITROSTAT) 0.4 MG SL tablet Place 1 tablet (0.4 mg total) under the tongue every 5 (five) minutes as needed for chest pain. 08/15/18 05/14/19 Yes Lendon Colonel, NP  Polyethyl Glycol-Propyl Glycol (SYSTANE OP) Place 1 drop into both eyes 2 (two) times daily.    Yes [provider]  Rosuvastatin Calcium 10 MG CPSP Take 10 mg by mouth daily.    Yes [provider]  torsemide (DEMADEX) 20 MG tablet Take 40 mg  by mouth every morning. 12/20/17  Yes [provider]  XARELTO 15 MG TABS tablet TAKE 1 TABLET BY MOUTH ONCE DAILY WITH SUPPER Patient taking differently: Take 15 mg by mouth daily.  03/14/19  Yes Hilty, Nadean Corwin, MD  esomeprazole (NEXIUM) 20 MG capsule TAKE 1 CAPSULE BY MOUTH ONCE DAILY AT  12  NOON Patient not taking: Reported on 05/14/2019 08/14/18   Irene Shipper, MD  isosorbide mononitrate (IMDUR) 30 MG  24 hr tablet Take 1 tablet (30 mg total) by mouth daily. Patient not taking: Reported on 05/14/2019 01/22/19 04/22/19  [name], MD    Family History Family History  Problem Relation Age of Onset  . Diabetes Mother   . CVA Mother   . Hypertension Mother   . Heart disease Father   . Hyperlipidemia Father   . Breast cancer Sister        x 3  . Heart disease Brother        x5; one with MI  . Heart disease Sister        x3  . Diabetes Sister        x3  . Lung cancer Sister   . Breast cancer Sister        x2  . Colon cancer Neg Hx     Social History Social History   Tobacco Use  . Smoking status: Never Smoker  . Smokeless tobacco: Never Used  Substance Use Topics  . Alcohol use: Not Currently    Comment: very occasional   . Drug use: Never     Allergies   Clonidine derivatives, Sulfa antibiotics, Crestor [rosuvastatin calcium], Epinephrine, Hydralazine, Losartan, and Other   Review of Systems Review of Systems  All other systems reviewed and are negative.    Physical Exam Updated Vital Signs BP (!) 151/53   Pulse 60   Temp (!) 102.9 F (39.4 C) (Rectal)   Resp (!) 26   SpO2 95%   Physical Exam Vitals signs and nursing note reviewed.  Constitutional:      General: She is not in acute distress.    Appearance: She is well-developed. She is ill-appearing. She is not toxic-appearing or diaphoretic.     Comments: Elderly, frail  HENT:     Head: Normocephalic and atraumatic.     Right Ear: External ear normal.     Left Ear: External ear  normal.     Nose: No congestion or rhinorrhea.  Eyes:     Conjunctiva/sclera: Conjunctivae normal.     Pupils: Pupils are equal, round, and reactive to light.  Neck:     Musculoskeletal: Normal range of motion and neck supple. No neck rigidity or muscular tenderness.     Trachea: Phonation normal.  Cardiovascular:     Rate and Rhythm: Normal rate and regular rhythm.     Heart sounds: Normal heart sounds.  Pulmonary:     Effort: Pulmonary effort is normal. No respiratory distress.     Breath sounds: Normal breath sounds. No stridor. No wheezing, rhonchi or rales.     Comments: Tachypnea is present Abdominal:     Palpations: Abdomen is soft.     Tenderness: There is no abdominal tenderness.  Musculoskeletal: Normal range of motion.     Right lower leg: No edema.     Left lower leg: No edema.  Skin:    General: Skin is warm and dry.     Coloration: Skin is not jaundiced or pale.  Neurological:     Mental Status: She is alert.     Cranial Nerves: No cranial nerve deficit.     Sensory: No sensory deficit.     Motor: No abnormal muscle tone.     Coordination: Coordination normal.  Psychiatric:        Mood and Affect: Mood normal.        Behavior: Behavior normal.      ED Treatments / Results  Labs (all labs  ordered are listed, but only abnormal results are displayed) Labs Reviewed  COMPREHENSIVE METABOLIC PANEL - Abnormal; Notable for the following components:      Result Value   Glucose, Bld 197 (*)    BUN 59 (*)    Creatinine, Ser 2.18 (*)    Calcium 8.7 (*)    GFR calc non Af Amer 21 (*)    GFR calc Af Amer 24 (*)    All other components within normal limits  CBC WITH DIFFERENTIAL/PLATELET - Abnormal; Notable for the following components:   RBC 3.04 (*)    Hemoglobin 9.8 (*)    HCT 30.0 (*)    All other components within normal limits  URINALYSIS, ROUTINE W REFLEX MICROSCOPIC - Abnormal; Notable for the following components:   Color, Urine STRAW (*)    All other  components within normal limits  SARS CORONAVIRUS 2 (HOSPITAL ORDER, [facility] LAB)  CULTURE, BLOOD (ROUTINE X 2)  CULTURE, BLOOD (ROUTINE X 2)  URINE CULTURE  LACTIC ACID, PLASMA    EKG None  Radiology Dg Chest Port 1 View  Result Date: 05/14/2019 CLINICAL DATA:  Weakness and fever EXAM: PORTABLE CHEST 1 VIEW COMPARISON:  None. FINDINGS: Left chest wall pacemaker leads are in expected position. There findings of median sternotomy and remote CABG. No focal airspace consolidation or pulmonary edema. No pleural effusion or pneumothorax. IMPRESSION: No active disease. Electronically Signed   By: Ulyses Jarred M.D.   On: 05/14/2019 21:36    Procedures .Critical Care Performed by: Daleen Bo, MD Authorized by: Daleen Bo, MD   Critical care provider statement:    Critical care time (minutes):  35   Critical care start time:  05/14/2019 8:30 PM   Critical care end time:  05/14/2019 11:10 PM   Critical care time was exclusive of:  Separately billable procedures and treating other patients   Critical care was necessary to treat or prevent imminent or life-threatening deterioration of the following conditions:  Respiratory failure   Critical care was time spent personally by me on the following activities:  Blood draw for specimens, development of treatment plan with patient or surrogate, discussions with consultants, evaluation of patient's response to treatment, examination of patient, obtaining history from patient or surrogate, ordering and performing treatments and interventions, ordering and review of laboratory studies, pulse oximetry, re-evaluation of patient's condition, review of old charts and ordering and review of radiographic studies   (including critical care time)  Medications Ordered in ED Medications  cefTRIAXone (ROCEPHIN) 1 g in sodium chloride 0.9 % 100 mL IVPB (1 g Intravenous New Bag/Given 05/14/19 2151)  acetaminophen (TYLENOL) tablet 650  mg (has no administration in time range)  sodium chloride 0.9 % bolus 1,000 mL (1,000 mLs Intravenous New Bag/Given 05/14/19 2142)    And  sodium chloride 0.9 % bolus 500 mL (500 mLs Intravenous New Bag/Given 05/14/19 2143)    And  sodium chloride 0.9 % bolus 250 mL (250 mLs Intravenous New Bag/Given 05/14/19 2143)     Initial Impression / Assessment and Plan / ED Course  I have reviewed the triage vital signs and the nursing notes.  Pertinent labs & imaging results that were available during my care of the patient were reviewed by me and considered in my medical decision making (see chart for details).  Clinical Course as of May 13 2309  Mon May 14, 2019  2258 Normal  Lactic acid, plasma [EW]  2258 Normal except glucose high, BUN  high, creatinine high, calcium low, GFR low  Comprehensive metabolic panel(!) [EW]  6295 Normal  SARS Coronavirus 2 (CEPHEID- Performed in [facility] hospital lab), [facility] Order [EW]  2258 Normal  Urinalysis, Routine w reflex microscopic (not at [facility])(!) [EW]  2259 Normal except hemoglobin low  CBC WITH DIFFERENTIAL(!) [EW]  2259 No infiltrate or CHF, image reviewed by me  DG Chest [facility] [EW]  2306 I discussed the situation with the patient's daughter, [name], on the phone.  He states that the patient lives alone and tonight [name] went to her house to give her a prescription.  No sick typically helps the patient with her medicines.  [name] was concerned that the patient was awake, confused, urinating a lot and not acting like herself.  She also noticed that the patient had some shortness of breath.  [name] did not know that the patient contacted her cardiologist earlier today.  [name] agrees with hospitalization for observation, at this time.   [EW]    Clinical Course User Index [EW] Daleen Bo, MD        Patient Vitals for the past 24 hrs:  BP Temp Temp src Pulse Resp SpO2  05/14/19 2200 (!) 151/53 - - 60 (!) 26 95 %  05/14/19 2152 (!)  154/60 - Oral 64 (!) 22 97 %  05/14/19 2129 (!) 153/71 - - 60 (!) 28 96 %  05/14/19 2034 (!) 159/49 (!) 102.9 F (39.4 C) Rectal 60 (!) 29 95 %  05/14/19 2031 - - - - - 95 %    11:00 PM Reevaluation with update and discussion. After initial assessment and treatment, an updated evaluation reveals no change in clinical status.  She remains confused. Daleen Bo   Medical Decision Making: Fever with confusion, likely delirium.  Etiology for fever not clear.  Testing for Covid-19 negative.  No evidence for pneumonia, UTI or signs of skin and soft tissue infection.  Patient currently lives alone.  She has a mild increased respiratory rate which may be related to fever.  Meningitis considered but felt to be unlikely, because she has not had meningismus.  Note that the patient is on Xarelto, making LP risky.  [name] was evaluated in Emergency Department on 05/14/2019 for the symptoms described in the history of present illness. She was evaluated in the context of the global COVID-19 pandemic, which necessitated consideration that the patient might be at risk for infection with the SARS-CoV-2 virus that causes COVID-19. Institutional protocols and algorithms that pertain to the evaluation of patients at risk for COVID-19 are in a state of rapid change based on information released by regulatory bodies including the CDC and federal and state organizations. These policies and algorithms were followed during the patient's care in the ED.   CRITICAL CARE-yes Performed by: Daleen Bo  Nursing Notes Reviewed/ Care Coordinated Applicable Imaging Reviewed Interpretation of Laboratory Data incorporated into ED treatment  11:09 PM-Consult complete with hospitalist. Patient case explained and discussed.  He agrees to admit patient for further evaluation and treatment. Call ended at 11:18 PM  Plan: Admit  Final Clinical Impressions(s) / ED Diagnoses   Final diagnoses:  Febrile illness  Altered  mental status, unspecified altered mental status type    ED Discharge Orders    None       Daleen Bo, MD 05/14/19 2320

## 2019-05-15 ENCOUNTER — Other Ambulatory Visit: Payer: Self-pay

## 2019-05-15 ENCOUNTER — Encounter (HOSPITAL_COMMUNITY): Payer: Self-pay | Admitting: Internal Medicine

## 2019-05-15 DIAGNOSIS — Z6827 Body mass index (BMI) 27.0-27.9, adult: Secondary | ICD-10-CM | POA: Diagnosis not present

## 2019-05-15 DIAGNOSIS — Z20828 Contact with and (suspected) exposure to other viral communicable diseases: Secondary | ICD-10-CM | POA: Diagnosis present

## 2019-05-15 DIAGNOSIS — A411 Sepsis due to other specified staphylococcus: Secondary | ICD-10-CM | POA: Diagnosis present

## 2019-05-15 DIAGNOSIS — I071 Rheumatic tricuspid insufficiency: Secondary | ICD-10-CM | POA: Diagnosis not present

## 2019-05-15 DIAGNOSIS — G9341 Metabolic encephalopathy: Secondary | ICD-10-CM | POA: Diagnosis present

## 2019-05-15 DIAGNOSIS — I340 Nonrheumatic mitral (valve) insufficiency: Secondary | ICD-10-CM | POA: Diagnosis not present

## 2019-05-15 DIAGNOSIS — G9340 Encephalopathy, unspecified: Secondary | ICD-10-CM | POA: Diagnosis present

## 2019-05-15 DIAGNOSIS — I130 Hypertensive heart and chronic kidney disease with heart failure and stage 1 through stage 4 chronic kidney disease, or unspecified chronic kidney disease: Secondary | ICD-10-CM | POA: Diagnosis present

## 2019-05-15 DIAGNOSIS — N184 Chronic kidney disease, stage 4 (severe): Secondary | ICD-10-CM | POA: Diagnosis present

## 2019-05-15 DIAGNOSIS — D696 Thrombocytopenia, unspecified: Secondary | ICD-10-CM | POA: Diagnosis present

## 2019-05-15 DIAGNOSIS — M6289 Other specified disorders of muscle: Secondary | ICD-10-CM | POA: Diagnosis present

## 2019-05-15 DIAGNOSIS — I2510 Atherosclerotic heart disease of native coronary artery without angina pectoris: Secondary | ICD-10-CM | POA: Diagnosis present

## 2019-05-15 DIAGNOSIS — R509 Fever, unspecified: Secondary | ICD-10-CM | POA: Diagnosis present

## 2019-05-15 DIAGNOSIS — R6510 Systemic inflammatory response syndrome (SIRS) of non-infectious origin without acute organ dysfunction: Secondary | ICD-10-CM | POA: Diagnosis present

## 2019-05-15 DIAGNOSIS — H9190 Unspecified hearing loss, unspecified ear: Secondary | ICD-10-CM | POA: Diagnosis present

## 2019-05-15 DIAGNOSIS — Z794 Long term (current) use of insulin: Secondary | ICD-10-CM | POA: Diagnosis not present

## 2019-05-15 DIAGNOSIS — Z950 Presence of cardiac pacemaker: Secondary | ICD-10-CM | POA: Diagnosis not present

## 2019-05-15 DIAGNOSIS — E785 Hyperlipidemia, unspecified: Secondary | ICD-10-CM | POA: Diagnosis present

## 2019-05-15 DIAGNOSIS — E119 Type 2 diabetes mellitus without complications: Secondary | ICD-10-CM | POA: Diagnosis not present

## 2019-05-15 DIAGNOSIS — D649 Anemia, unspecified: Secondary | ICD-10-CM | POA: Diagnosis not present

## 2019-05-15 DIAGNOSIS — I361 Nonrheumatic tricuspid (valve) insufficiency: Secondary | ICD-10-CM | POA: Diagnosis not present

## 2019-05-15 DIAGNOSIS — D631 Anemia in chronic kidney disease: Secondary | ICD-10-CM | POA: Diagnosis present

## 2019-05-15 DIAGNOSIS — Z955 Presence of coronary angioplasty implant and graft: Secondary | ICD-10-CM | POA: Diagnosis not present

## 2019-05-15 DIAGNOSIS — D62 Acute posthemorrhagic anemia: Secondary | ICD-10-CM | POA: Diagnosis present

## 2019-05-15 DIAGNOSIS — R627 Adult failure to thrive: Secondary | ICD-10-CM | POA: Diagnosis present

## 2019-05-15 DIAGNOSIS — E1122 Type 2 diabetes mellitus with diabetic chronic kidney disease: Secondary | ICD-10-CM | POA: Diagnosis present

## 2019-05-15 DIAGNOSIS — I495 Sick sinus syndrome: Secondary | ICD-10-CM | POA: Diagnosis present

## 2019-05-15 DIAGNOSIS — F0390 Unspecified dementia without behavioral disturbance: Secondary | ICD-10-CM | POA: Diagnosis present

## 2019-05-15 DIAGNOSIS — A419 Sepsis, unspecified organism: Secondary | ICD-10-CM | POA: Diagnosis not present

## 2019-05-15 DIAGNOSIS — K661 Hemoperitoneum: Secondary | ICD-10-CM | POA: Diagnosis present

## 2019-05-15 DIAGNOSIS — W5503XA Scratched by cat, initial encounter: Secondary | ICD-10-CM | POA: Diagnosis not present

## 2019-05-15 DIAGNOSIS — I480 Paroxysmal atrial fibrillation: Secondary | ICD-10-CM | POA: Diagnosis present

## 2019-05-15 DIAGNOSIS — Z951 Presence of aortocoronary bypass graft: Secondary | ICD-10-CM | POA: Diagnosis not present

## 2019-05-15 DIAGNOSIS — I10 Essential (primary) hypertension: Secondary | ICD-10-CM | POA: Diagnosis not present

## 2019-05-15 DIAGNOSIS — I5032 Chronic diastolic (congestive) heart failure: Secondary | ICD-10-CM | POA: Diagnosis present

## 2019-05-15 DIAGNOSIS — D638 Anemia in other chronic diseases classified elsewhere: Secondary | ICD-10-CM | POA: Diagnosis not present

## 2019-05-15 DIAGNOSIS — R7881 Bacteremia: Secondary | ICD-10-CM | POA: Diagnosis not present

## 2019-05-15 LAB — APTT
aPTT: 36 seconds (ref 24–36)
aPTT: 130 seconds — ABNORMAL HIGH (ref 24–36)

## 2019-05-15 LAB — BLOOD CULTURE ID PANEL (REFLEXED)
Acinetobacter baumannii: NOT DETECTED
Candida albicans: NOT DETECTED
Candida glabrata: NOT DETECTED
Candida krusei: NOT DETECTED
Candida parapsilosis: NOT DETECTED
Candida tropicalis: NOT DETECTED
Enterobacter cloacae complex: NOT DETECTED
Enterobacteriaceae species: NOT DETECTED
Enterococcus species: NOT DETECTED
Escherichia coli: NOT DETECTED
Haemophilus influenzae: NOT DETECTED
Klebsiella oxytoca: NOT DETECTED
Klebsiella pneumoniae: NOT DETECTED
Listeria monocytogenes: NOT DETECTED
Methicillin resistance: DETECTED — AB
Neisseria meningitidis: NOT DETECTED
Proteus species: NOT DETECTED
Pseudomonas aeruginosa: NOT DETECTED
Serratia marcescens: NOT DETECTED
Staphylococcus aureus (BCID): NOT DETECTED
Staphylococcus species: DETECTED — AB
Streptococcus agalactiae: NOT DETECTED
Streptococcus pneumoniae: NOT DETECTED
Streptococcus pyogenes: NOT DETECTED
Streptococcus species: DETECTED — AB

## 2019-05-15 LAB — COMPREHENSIVE METABOLIC PANEL
ALT: 17 U/L (ref 0–44)
AST: 32 U/L (ref 15–41)
Albumin: 3.9 g/dL (ref 3.5–5.0)
Alkaline Phosphatase: 67 U/L (ref 38–126)
Anion gap: 12 (ref 5–15)
BUN: 58 mg/dL — ABNORMAL HIGH (ref 8–23)
CO2: 21 mmol/L — ABNORMAL LOW (ref 22–32)
Calcium: 8.1 mg/dL — ABNORMAL LOW (ref 8.9–10.3)
Chloride: 105 mmol/L (ref 98–111)
Creatinine, Ser: 2.32 mg/dL — ABNORMAL HIGH (ref 0.44–1.00)
GFR calc Af Amer: 22 mL/min — ABNORMAL LOW (ref >60)
GFR calc non Af Amer: 19 mL/min — ABNORMAL LOW (ref >60)
Glucose, Bld: 202 mg/dL — ABNORMAL HIGH (ref 70–99)
Potassium: 4.7 mmol/L (ref 3.5–5.1)
Sodium: 138 mmol/L (ref 135–145)
Total Bilirubin: 0.8 mg/dL (ref 0.3–1.2)
Total Protein: 7.2 g/dL (ref 6.5–8.1)

## 2019-05-15 LAB — CBC WITH DIFFERENTIAL/PLATELET
Abs Immature Granulocytes: 0.06 10*3/uL (ref 0.00–0.07)
Basophils Absolute: 0 10*3/uL (ref 0.0–0.1)
Basophils Relative: 0 %
Eosinophils Absolute: 0 10*3/uL (ref 0.0–0.5)
Eosinophils Relative: 0 %
HCT: 30.2 % — ABNORMAL LOW (ref 36.0–46.0)
Hemoglobin: 9.7 g/dL — ABNORMAL LOW (ref 12.0–15.0)
Immature Granulocytes: 1 %
Lymphocytes Relative: 8 %
Lymphs Abs: 0.8 10*3/uL (ref 0.7–4.0)
MCH: 32.3 pg (ref 26.0–34.0)
MCHC: 32.1 g/dL (ref 30.0–36.0)
MCV: 100.7 fL — ABNORMAL HIGH (ref 80.0–100.0)
Monocytes Absolute: 0.6 10*3/uL (ref 0.1–1.0)
Monocytes Relative: 6 %
Neutro Abs: 8.4 10*3/uL — ABNORMAL HIGH (ref 1.7–7.7)
Neutrophils Relative %: 85 %
Platelets: 127 10*3/uL — ABNORMAL LOW (ref 150–400)
RBC: 3 MIL/uL — ABNORMAL LOW (ref 3.87–5.11)
RDW: 12.9 % (ref 11.5–15.5)
WBC: 9.8 10*3/uL (ref 4.0–10.5)
nRBC: 0 % (ref 0.0–0.2)

## 2019-05-15 LAB — URINE CULTURE: Culture: <10000 — AB

## 2019-05-15 LAB — PROTIME-INR
INR: 2.2 — ABNORMAL HIGH (ref 0.8–1.2)
Prothrombin Time: 24 seconds — ABNORMAL HIGH (ref 11.4–15.2)

## 2019-05-15 LAB — CBG MONITORING, ED: Glucose-Capillary: 202 mg/dL — ABNORMAL HIGH (ref 70–99)

## 2019-05-15 LAB — HEPARIN LEVEL (UNFRACTIONATED): Heparin Unfractionated: >2.2 IU/mL — ABNORMAL HIGH (ref 0.30–0.70)

## 2019-05-15 LAB — HEMOGLOBIN A1C
Hgb A1c MFr Bld: 7.1 % — ABNORMAL HIGH (ref 4.8–5.6)
Mean Plasma Glucose: 157.07 mg/dL

## 2019-05-15 LAB — GLUCOSE, CAPILLARY
Glucose-Capillary: 150 mg/dL — ABNORMAL HIGH (ref 70–99)
Glucose-Capillary: 152 mg/dL — ABNORMAL HIGH (ref 70–99)

## 2019-05-15 MED ORDER — HEPARIN (PORCINE) 25000 UT/250ML-% IV SOLN
700.0000 [IU]/h | INTRAVENOUS | Status: DC
  Administered 2019-05-15: 700 [IU]/h via INTRAVENOUS

## 2019-05-15 MED ORDER — ACETAMINOPHEN 650 MG RE SUPP: 650.0000 mg | Freq: Four times a day (QID) | RECTAL | Status: DC | PRN

## 2019-05-15 MED ORDER — ROSUVASTATIN CALCIUM 10 MG PO TABS
10.0000 mg | ORAL_TABLET | Freq: Every day | ORAL | Status: DC
  Administered 2019-05-15 – 2019-05-22 (×8): 10 mg via ORAL
  Filled 2019-05-15 (×8): qty 1

## 2019-05-15 MED ORDER — ONDANSETRON HCL 4 MG/2ML IJ SOLN
4.0000 mg | Freq: Four times a day (QID) | INTRAMUSCULAR | Status: DC | PRN
  Administered 2019-05-17 – 2019-05-22 (×4): 4 mg via INTRAVENOUS
  Filled 2019-05-15 (×5): qty 2

## 2019-05-15 MED ORDER — CARVEDILOL 12.5 MG PO TABS
12.5000 mg | ORAL_TABLET | Freq: Two times a day (BID) | ORAL | Status: DC
  Administered 2019-05-15 – 2019-05-18 (×8): 12.5 mg via ORAL
  Filled 2019-05-15 (×8): qty 1

## 2019-05-15 MED ORDER — INSULIN ASPART 100 UNIT/ML ~~LOC~~ SOLN
0.0000 [IU] | Freq: Three times a day (TID) | SUBCUTANEOUS | Status: DC
  Administered 2019-05-15: 1 [IU] via SUBCUTANEOUS
  Administered 2019-05-15: 3 [IU] via SUBCUTANEOUS
  Administered 2019-05-16 (×2): 1 [IU] via SUBCUTANEOUS
  Administered 2019-05-17: 2 [IU] via SUBCUTANEOUS
  Administered 2019-05-17: 1 [IU] via SUBCUTANEOUS
  Administered 2019-05-18 – 2019-05-20 (×2): 2 [IU] via SUBCUTANEOUS
  Administered 2019-05-20: 1 [IU] via SUBCUTANEOUS
  Administered 2019-05-21: 2 [IU] via SUBCUTANEOUS
  Administered 2019-05-21: 1 [IU] via SUBCUTANEOUS
  Administered 2019-05-22: 2 [IU] via SUBCUTANEOUS
  Administered 2019-05-22: 1 [IU] via SUBCUTANEOUS

## 2019-05-15 MED ORDER — FERROUS SULFATE 325 (65 FE) MG PO TABS
325.0000 mg | ORAL_TABLET | Freq: Every day | ORAL | Status: DC
  Administered 2019-05-15 – 2019-05-22 (×7): 325 mg via ORAL
  Filled 2019-05-15 (×7): qty 1

## 2019-05-15 MED ORDER — DOCUSATE SODIUM 100 MG PO CAPS
100.0000 mg | ORAL_CAPSULE | Freq: Two times a day (BID) | ORAL | Status: DC | PRN
  Administered 2019-05-21: 100 mg via ORAL
  Filled 2019-05-15: qty 1

## 2019-05-15 MED ORDER — INSULIN GLARGINE 100 UNIT/ML ~~LOC~~ SOLN
22.0000 [IU] | Freq: Every day | SUBCUTANEOUS | Status: DC
  Administered 2019-05-15 – 2019-05-17 (×3): 22 [IU] via SUBCUTANEOUS
  Filled 2019-05-15 (×5): qty 0.22

## 2019-05-15 MED ORDER — ACETAMINOPHEN 325 MG PO TABS
650.0000 mg | ORAL_TABLET | Freq: Four times a day (QID) | ORAL | Status: DC | PRN
  Administered 2019-05-15 – 2019-05-21 (×9): 650 mg via ORAL
  Filled 2019-05-15 (×9): qty 2

## 2019-05-15 MED ORDER — CLOPIDOGREL BISULFATE 75 MG PO TABS
75.0000 mg | ORAL_TABLET | Freq: Every day | ORAL | Status: DC
  Administered 2019-05-15 – 2019-05-18 (×4): 75 mg via ORAL
  Filled 2019-05-15 (×4): qty 1

## 2019-05-15 MED ORDER — HEPARIN (PORCINE) 25000 UT/250ML-% IV SOLN
800.0000 [IU]/h | INTRAVENOUS | Status: DC
  Administered 2019-05-15: 800 [IU]/h via INTRAVENOUS
  Filled 2019-05-15: qty 250

## 2019-05-15 MED ORDER — ONDANSETRON HCL 4 MG PO TABS: 4.0000 mg | ORAL_TABLET | Freq: Four times a day (QID) | ORAL | Status: DC | PRN

## 2019-05-15 MED ORDER — LEVOTHYROXINE SODIUM 88 MCG PO TABS
88.0000 ug | ORAL_TABLET | Freq: Every day | ORAL | Status: DC
  Administered 2019-05-15 – 2019-05-22 (×7): 88 ug via ORAL
  Filled 2019-05-15 (×8): qty 1

## 2019-05-15 MED ORDER — NITROGLYCERIN 0.4 MG SL SUBL: 0.4000 mg | SUBLINGUAL_TABLET | SUBLINGUAL | Status: DC | PRN

## 2019-05-15 MED ORDER — VITAMIN B-12 100 MCG PO TABS
100.0000 ug | ORAL_TABLET | Freq: Every day | ORAL | Status: DC
  Administered 2019-05-15 – 2019-05-22 (×7): 100 ug via ORAL
  Filled 2019-05-15 (×9): qty 1

## 2019-05-15 NOTE — Progress Notes (Signed)
PHARMACY - PHYSICIAN COMMUNICATION CRITICAL VALUE ALERT - BLOOD CULTURE IDENTIFICATION (BCID)  [name] is an 78 y.o. female who presented to [facility] on [facility]/14/2019 with a chief complaint of fever  Assessment:  1 of 4 bottles detected staph and strep species - likely contaminant  Name of physician (or Provider) ContactedKarleen Walker  Current antibiotics: Rocephin  Changes to prescribed antibiotics recommended:  No changes recommended  No results found for this or any previous visit.  [name] 05/15/2019  6:15 PM

## 2019-05-15 NOTE — Progress Notes (Signed)
[place] for Heparin Indication: atrial fibrillation on PTA Xarelto  Allergies  Allergen Reactions  . Clonidine Derivatives Other (See Comments)    Bradycardia and fatigue   . Sulfa Antibiotics Other (See Comments)    Unknown  . Crestor [Rosuvastatin Calcium] Other (See Comments)    Other reaction(s): tired and weak  . Epinephrine Other (See Comments)    Abnormal feeling. Dental exam/injection of local w/ epi.  Marland Kitchen Hydralazine Other (See Comments)    Nausea/gi upset   . Losartan Other (See Comments)    Hyperkalemia   . Other Other (See Comments)    MANGO'S - WELTS ALL OVER   Patient Measurements: Height: 5' 0.98" (154.9 cm) Weight: 139 lb 1.8 oz (63.1 kg) IBW/kg (Calculated) : 47.76 Heparin Dosing Weight: 60.8  Vital Signs: Temp: 98.6 F (37 C) (07/21 1259) Temp Source: Oral (07/21 1259) BP: 141/50 (07/21 1259) Pulse Rate: 62 (07/21 1259)  Labs: Recent Labs    05/14/19 2052 05/15/19 0341 05/15/19 0500 05/15/19 1641  HGB 9.8*  --  9.7*  --   HCT 30.0*  --  30.2*  --   PLT 152  --  127*  --   APTT  --  36  --  130*  LABPROT  --  24.0*  --   --   INR  --  2.2*  --   --   HEPARINUNFRC  --  >2.20*  --   --   CREATININE 2.18*  --  2.32*  --    Estimated Creatinine Clearance: 16.7 mL/min (A) (by C-G formula based on SCr of 2.32 mg/dL (H)).  Medical History: Past Medical History:  Diagnosis Date  . Anemia   . Anxiety   . Arthritis    "in my hands; knees, back" (09/27/2018)  . CAD (coronary artery disease)    a. 40-59% bilaterally 10/2015.  Marland Kitchen Chronic diastolic CHF (congestive heart failure) ([place])    a. 05/2016 Echo: EF 60-65%, no rwma, Gr1 DD, Ao sclerosis w/o stenosis, triv MR;  b. 07/2016 TEE: EF 55-60%, no rwma, mild MR.  . CKD (chronic kidney disease), stage III ([place])   . Coronary artery disease    a. 02/2007 Persantine MV: low risk;  b. 11/2011 CABG x 3 (LIMA->LAD, VG->OM, VG->RCA);  c. 05/2016 MV: EF >65%, no  isch/infarct, horiz ST dep in I, II, V5-V6.  Marland Kitchen Depression   . Diverticulosis   . Esophageal stricture   . GERD (gastroesophageal reflux disease)   . Hemorrhoids   . Hiatal hernia   . Hyperkalemia    a. ARB stopped due to this.  . Hyperlipidemia   . Hypertension   . Hypertensive heart disease   . Hypothyroidism   . Mild cognitive impairment    a. seen by neurology.  . Myocardial infarction ([place]) 12/07/2011  . PAF (paroxysmal atrial fibrillation) ([facility])    a. post-op CABG.  . Pain    RIGHT KNEE PAIN - TORN RIGHT MEDIAL MENISCUS  . Paroxysmal atrial flutter ([place])    a. 07/2016 s/p TEE & DCCV;  b. 07/2016 Recurrent PAFlutter req initiation of amio & PPM in setting of tachy-brady;  c. CHA2DS2VASc = 7-->Xarelto 15 mg QD.  Marland Kitchen Pneumonia    "twice" (09/27/2018)  . PONV (postoperative nausea and vomiting)   . Presence of permanent cardiac pacemaker 08/12/2016  . S/P CABG (coronary artery bypass graft), 12/04/11 12/07/2011   LIMA to LAD, SVG to OM, SVG to RCA  . Sinus bradycardia  a. not on BB due to this.  . Skin cancer    "face" (09/27/2018)  . Tachy-brady syndrome ([place])    a. 07/2016 Jxnl brady following DCCV, recurrent Aflutter-->amio + SJM 2272 Assurity MRI DC PPM (ser # [id number]).  . Type II diabetes mellitus ([place])    Medications:  Scheduled:  . carvedilol  12.5 mg Oral BID  . clopidogrel  75 mg Oral Daily  . ferrous sulfate  325 mg Oral Q breakfast  . insulin aspart  0-9 Units Subcutaneous TID WC  . insulin glargine  22 Units Subcutaneous Daily  . levothyroxine  88 mcg Oral Q0600  . rosuvastatin  10 mg Oral Daily  . vitamin B-12  100 mcg Oral Daily   Infusions:  . cefTRIAXone (ROCEPHIN)  IV Stopped (05/14/19 2321)  . heparin 800 Units/hr (05/15/19 0816)    Assessment: 49 yoF with fever, weakness on PTA xarelto for A-fib. LD 7/20 at unknown time  Baseline labs:  H/H=9.8/30, plts = 152, aptt=36   , INR= 2.2 , HL= artificially elevated at > 2.2, due to Xarelto Will use  aptt to follow as HL is falsely elevated d/t xarelto  05/15/19, PM  APTT SUPRAtherapeutic on current IV heparin rate of 800 units/hr  Per RN, no reported bleeding or problems noted  Goal of Therapy:  Heparin level 0.3-0.7 units/ml Heparin level 66-102 units/ml  Monitor platelets by anticoagulation protocol: Yes   Plan:   Decrease IV heparin rate from 800 units/hr to 700 units/hr  Recheck aPTT 6 hours after rate decrease  Daily CBC    Adrian Saran, PharmD, BCPS 05/15/2019 5:40 PM

## 2019-05-15 NOTE — ED Notes (Signed)
ED TO INPATIENT HANDOFF REPORT  ED Nurse Name and Phone #: [phone]  S Name/Age/Gender [name] 78 y.o. female Room/Bed: WA18/WA18  Code Status   Code Status: Full Code  Home/SNF/Other Home Patient oriented to: self, place, time and situation Is this baseline? Yes   Triage Complete: Triage complete  Chief Complaint Weakness Fever and Fatigue  Triage Note Pt  From home c/o of generalized weakness with fever and confusion that started this AM.   Allergies Allergies  Allergen Reactions  . Clonidine Derivatives Other (See Comments)    Bradycardia and fatigue   . Sulfa Antibiotics Other (See Comments)    Unknown  . Crestor [Rosuvastatin Calcium] Other (See Comments)    Other reaction(s): tired and weak  . Epinephrine Other (See Comments)    Abnormal feeling. Dental exam/injection of local w/ epi.  Marland Kitchen Hydralazine Other (See Comments)    Nausea/gi upset   . Losartan Other (See Comments)    Hyperkalemia   . Other Other (See Comments)    MANGO'S - WELTS ALL OVER    Level of Care/Admitting Diagnosis ED Disposition    ED Disposition Condition Comment   Admit  Hospital Area: [facility] [[id number]]  Level of Care: Telemetry [5]  Admit to tele based on following criteria: Monitor for Ischemic changes  Covid Evaluation: Person Under Investigation (PUI)  Diagnosis: SIRS (systemic inflammatory response syndrome) ([place]) [903009]  Admitting Physician: Rise [name] [phone]  Attending Physician: Rise [name] [email or web address]  PT Class (Do Not Modify): Observation [104]  PT Acc Code (Do Not Modify): Observation [10022]       B Medical/Surgery History Past Medical History:  Diagnosis Date  . Anemia   . Anxiety   . Arthritis    "in my hands; knees, back" (09/27/2018)  . CAD (coronary artery disease)    a. 40-59% bilaterally 10/2015.  Marland Kitchen Chronic diastolic CHF (congestive heart failure) ([place])    a. 05/2016 Echo: EF 60-65%, no rwma, Gr1 DD, Ao  sclerosis w/o stenosis, triv MR;  b. 07/2016 TEE: EF 55-60%, no rwma, mild MR.  . CKD (chronic kidney disease), stage III ([place])   . Coronary artery disease    a. 02/2007 Persantine MV: low risk;  b. 11/2011 CABG x 3 (LIMA->LAD, VG->OM, VG->RCA);  c. 05/2016 MV: EF >65%, no isch/infarct, horiz ST dep in I, II, V5-V6.  Marland Kitchen Depression   . Diverticulosis   . Esophageal stricture   . GERD (gastroesophageal reflux disease)   . Hemorrhoids   . Hiatal hernia   . Hyperkalemia    a. ARB stopped due to this.  . Hyperlipidemia   . Hypertension   . Hypertensive heart disease   . Hypothyroidism   . Mild cognitive impairment    a. seen by neurology.  . Myocardial infarction ([place]) 12/07/2011  . PAF (paroxysmal atrial fibrillation) ([facility])    a. post-op CABG.  . Pain    RIGHT KNEE PAIN - TORN RIGHT MEDIAL MENISCUS  . Paroxysmal atrial flutter ([place])    a. 07/2016 s/p TEE & DCCV;  b. 07/2016 Recurrent PAFlutter req initiation of amio & PPM in setting of tachy-brady;  c. CHA2DS2VASc = 7-->Xarelto 15 mg QD.  Marland Kitchen Pneumonia    "twice" (09/27/2018)  . PONV (postoperative nausea and vomiting)   . Presence of permanent cardiac pacemaker 08/12/2016  . S/P CABG (coronary artery bypass graft), 12/04/11 12/07/2011   LIMA to LAD, SVG to OM, SVG to RCA  . Sinus bradycardia  a. not on BB due to this.  . Skin cancer    "face" (09/27/2018)  . Tachy-brady syndrome ([place])    a. 07/2016 Jxnl brady following DCCV, recurrent Aflutter-->amio + SJM 2272 Assurity MRI DC PPM (ser # [id number]).  . Type II diabetes mellitus ([place])    Past Surgical History:  Procedure Laterality Date  . ABDOMINAL HYSTERECTOMY  1980's  . ANKLE FRACTURE SURGERY Right    "put pins both side right ankle"  . BACK SURGERY  2006   "cyst growing near my spine"  . CARDIAC CATHETERIZATION  12/02/2011   mild LV dysfunction with mod hypocontractility of mid-distal anterolateral wall; CAD w/ostial tapering of L Main with 50% diffuse ostial narrowing of LAD, 99%  eccentric focal prox LAD stenosis followed by 70% prox LAD stenosis after 1st diag, 20% mid LAD narrowing; 80% ostial-to-prox L Cfx stenosis & 40-50% irregularity of RCA (Dr. Corky Downs)  . CARDIOVERSION N/A 08/11/2016   Procedure: CARDIOVERSION;  Surgeon: Lelon Perla, MD;  Location: [facility] ENDOSCOPY;  Service: Cardiovascular;  Laterality: N/A;  . CATARACT EXTRACTION W/ INTRAOCULAR LENS  IMPLANT, BILATERAL Bilateral ~ 2010  . [place]  . CORONARY ARTERY BYPASS GRAFT  12/04/2011   Procedure: CORONARY ARTERY BYPASS GRAFTING (CABG);  Surgeon: Tharon Aquas Adelene Idler, MD;  Location: [facility];  Service: Open Heart Surgery;  Laterality: N/A;  CABG x three,  using left internal mammary artery, and right leg greater saphenous vein harvested endoscopically  . CORONARY STENT INTERVENTION N/A 09/27/2018   Procedure: CORONARY STENT INTERVENTION;  Surgeon: Troy Sine, MD;  Location: [facility] CV LAB;  Service: Cardiovascular;  Laterality: N/A;  . DILATION AND CURETTAGE OF UTERUS     "a couple times"  . EP IMPLANTABLE DEVICE N/A 08/12/2016   Procedure: Pacemaker Implant;  Surgeon: Will Meredith Leeds, MD;  Location: [facility] CV LAB;  Service: Cardiovascular;  Laterality: N/A;  . ESOPHAGOGASTRODUODENOSCOPY (EGD) WITH ESOPHAGEAL DILATION    . FRACTURE SURGERY    . JOINT REPLACEMENT    . KNEE ARTHROSCOPY WITH MEDIAL MENISECTOMY Right 07/02/2014   Procedure: RIGHT KNEE ARTHROSCOPY WITH PARTIAL MEDIAL MENISTECTOMY, ABRASION CONDROPLASTYU OF PATELLA,ABRASION CONDROPLASTY OF MEDIAL FEMEROL CONDYL, MICROFRACTURE OF MEDIAL FEMEROL CONDYL;  Surgeon: Tobi Bastos, MD;  Location: [facility];  Service: Orthopedics;  Laterality: Right;  . LEFT HEART CATH AND CORS/GRAFTS ANGIOGRAPHY N/A 09/06/2018   Procedure: LEFT HEART CATH AND CORS/GRAFTS ANGIOGRAPHY;  Surgeon: Troy Sine, MD;  Location: [facility] CV LAB;  Service: Cardiovascular;  Laterality: N/A;  . LEFT HEART CATHETERIZATION WITH CORONARY ANGIOGRAM  N/A 12/02/2011   Procedure: LEFT HEART CATHETERIZATION WITH CORONARY ANGIOGRAM;  Surgeon: Troy Sine, MD;  Location: [facility] CATH LAB;  Service: Cardiovascular;  Laterality: N/A;  Coronary angiogram, possible PCI  . TEE WITHOUT CARDIOVERSION N/A 08/11/2016   Procedure: TRANSESOPHAGEAL ECHOCARDIOGRAM (TEE);  Surgeon: Lelon Perla, MD;  Location: [facility];  Service: Cardiovascular;  Laterality: N/A;  . TONSILLECTOMY  1949  . TOTAL KNEE ARTHROPLASTY Left ~ 2006  . TRANSTHORACIC ECHOCARDIOGRAM  02/19/2013   EF 88-50%, grade 1 diastolic dysfunction; mildly thickend/calcified AV leaflets; mildly calcidied MV annulus; mild TR     A IV Location/Drains/Wounds Patient Lines/Drains/Airways Status   Active Line/Drains/Airways    Name:   Placement date:   Placement time:   Site:   Days:   Peripheral IV 05/14/19 Left Antecubital   05/14/19    0737    Antecubital   1   Peripheral  IV 05/14/19 Right Arm   05/14/19    2133    Arm   1   External Urinary Catheter   09/27/18    1740    -   230          Intake/Output Last 24 hours No intake or output data in the 24 hours ending 05/15/19 1203  Labs/Imaging Results for orders placed or performed during the hospital encounter of 05/14/19 (from the past 48 hour(s))  Comprehensive metabolic panel     Status: Abnormal   Collection Time: 05/14/19  8:52 PM  Result Value Ref Range   Sodium 137 135 - 145 mmol/L   Potassium 4.3 3.5 - 5.1 mmol/L   Chloride 103 98 - 111 mmol/L   CO2 22 22 - 32 mmol/L   Glucose, Bld 197 (H) 70 - 99 mg/dL   BUN 59 (H) 8 - 23 mg/dL   Creatinine, Ser 2.18 (H) 0.44 - 1.00 mg/dL   Calcium 8.7 (L) 8.9 - 10.3 mg/dL   Total Protein 7.6 6.5 - 8.1 g/dL   Albumin 4.1 3.5 - 5.0 g/dL   AST 30 15 - 41 U/L   ALT 17 0 - 44 U/L   Alkaline Phosphatase 69 38 - 126 U/L   Total Bilirubin 0.8 0.3 - 1.2 mg/dL   GFR calc non Af Amer 21 (L) >60 mL/min   GFR calc Af Amer 24 (L) >60 mL/min   Anion gap 12 5 - 15    Comment: Performed at [facility], [place] [address], [place], [place] [address]  CBC WITH DIFFERENTIAL     Status: Abnormal   Collection Time: 05/14/19  8:52 PM  Result Value Ref Range   WBC 7.7 4.0 - 10.5 K/uL   RBC 3.04 (L) 3.87 - 5.11 MIL/uL   Hemoglobin 9.8 (L) 12.0 - 15.0 g/dL   HCT 30.0 (L) 36.0 - 46.0 %   MCV 98.7 80.0 - 100.0 fL   MCH 32.2 26.0 - 34.0 pg   MCHC 32.7 30.0 - 36.0 g/dL   RDW 12.7 11.5 - 15.5 %   Platelets 152 150 - 400 K/uL   nRBC 0.0 0.0 - 0.2 %   Neutrophils Relative % 85 %   Neutro Abs 6.4 1.7 - 7.7 K/uL   Lymphocytes Relative 9 %   Lymphs Abs 0.7 0.7 - 4.0 K/uL   Monocytes Relative 6 %   Monocytes Absolute 0.5 0.1 - 1.0 K/uL   Eosinophils Relative 0 %   Eosinophils Absolute 0.0 0.0 - 0.5 K/uL   Basophils Relative 0 %   Basophils Absolute 0.0 0.0 - 0.1 K/uL   Immature Granulocytes 0 %   Abs Immature Granulocytes 0.02 0.00 - 0.07 K/uL    Comment: Performed at [facility], [place] [address], [place], [place] [address]  Blood Culture (routine x 2)     Status: None (Preliminary result)   Collection Time: 05/14/19  8:52 PM   Specimen: BLOOD  Result Value Ref Range   Specimen Description      BLOOD BLOOD RIGHT ARM Performed at [facility] [address], [place], [place] [address]    Special Requests      BOTTLES DRAWN AEROBIC AND ANAEROBIC Blood Culture results may not be optimal due to an inadequate volume of blood received in culture bottles Performed at [facility], [place] [address], [place], [place] [address]    Culture      NO  GROWTH < 12 HOURS Performed at [facility] [address], [place], [place] [address]    Report Status PENDING   Urinalysis, Routine w reflex microscopic (not at [facility])     Status: Abnormal   Collection Time: 05/14/19  8:52 PM  Result Value Ref Range   Color, Urine STRAW (A) YELLOW   APPearance CLEAR CLEAR   Specific Gravity, Urine 1.008 1.005 - 1.030   pH 5.0 5.0 -  8.0   Glucose, UA NEGATIVE NEGATIVE mg/dL   Hgb urine dipstick NEGATIVE NEGATIVE   Bilirubin Urine NEGATIVE NEGATIVE   Ketones, ur NEGATIVE NEGATIVE mg/dL   Protein, ur NEGATIVE NEGATIVE mg/dL   Nitrite NEGATIVE NEGATIVE   Leukocytes,Ua NEGATIVE NEGATIVE    Comment: Performed at [facility] [address], [place], [place] [address]  Lactic acid, plasma     Status: None   Collection Time: 05/14/19  8:53 PM  Result Value Ref Range   Lactic Acid, Venous 1.1 0.5 - 1.9 mmol/L    Comment: Performed at [facility], [place] [address], [place], [place] [address]  SARS Coronavirus 2 (CEPHEID- Performed in [place] hospital lab), Hosp Order     Status: None   Collection Time: 05/14/19  8:55 PM   Specimen: Nasopharyngeal Swab  Result Value Ref Range   SARS Coronavirus 2 NEGATIVE NEGATIVE    Comment: (NOTE) If result is NEGATIVE SARS-CoV-2 target nucleic acids are NOT DETECTED. The SARS-CoV-2 RNA is generally detectable in upper and lower  respiratory specimens during the acute phase of infection. The lowest  concentration of SARS-CoV-2 viral copies this assay can detect is 250  copies / mL. A negative result does not preclude SARS-CoV-2 infection  and should not be used as the sole basis for treatment or other  patient management decisions.  A negative result may occur with  improper specimen collection / handling, submission of specimen other  than nasopharyngeal swab, presence of viral mutation(s) within the  areas targeted by this assay, and inadequate number of viral copies  (<250 copies / mL). A negative result must be combined with clinical  observations, patient history, and epidemiological information. If result is POSITIVE SARS-CoV-2 target nucleic acids are DETECTED. The SARS-CoV-2 RNA is generally detectable in upper and lower  respiratory specimens dur ing the acute phase of infection.  Positive  results are indicative of active  infection with SARS-CoV-2.  Clinical  correlation with patient history and other diagnostic information is  necessary to determine patient infection status.  Positive results do  not rule out bacterial infection or co-infection with other viruses. If result is PRESUMPTIVE POSTIVE SARS-CoV-2 nucleic acids MAY BE PRESENT.   A presumptive positive result was obtained on the submitted specimen  and confirmed on repeat testing.  While 2019 novel coronavirus  (SARS-CoV-2) nucleic acids may be present in the submitted sample  additional confirmatory testing may be necessary for epidemiological  and / or clinical management purposes  to differentiate between  SARS-CoV-2 and other Sarbecovirus currently known to infect humans.  If clinically indicated additional testing with an alternate test  methodology [phone]) is advised. The SARS-CoV-2 RNA is generally  detectable in upper and lower respiratory sp ecimens during the acute  phase of infection. The expected result is Negative. Fact Sheet for Patients:  StrictlyIdeas.no Fact Sheet for Healthcare Providers: BankingDealers.co.za This test is not yet approved or cleared by the [place] FDA and has been authorized for detection and/or diagnosis of  SARS-CoV-2 by FDA under an Emergency Use Authorization (EUA).  This EUA will remain in effect (meaning this test can be used) for the duration of the COVID-19 declaration under Section 564(b)(1) of the Act, 21 U.S.C. section 360bbb-3(b)(1), unless the authorization is terminated or revoked sooner. Performed at [facility], [place] [address], [place], [place] [address]   Blood Culture (routine x 2)     Status: None (Preliminary result)   Collection Time: 05/14/19  8:57 PM   Specimen: BLOOD  Result Value Ref Range   Specimen Description      BLOOD BLOOD LEFT ARM Performed at [facility] [address],  [place], [place] [address]    Special Requests      BOTTLES DRAWN AEROBIC AND ANAEROBIC Blood Culture adequate volume Performed at [facility] [address], [place], [place] [address]    Culture      NO GROWTH < 12 HOURS Performed at [facility] [address], [place], [place] [address]    Report Status PENDING   Hemoglobin A1c     Status: Abnormal   Collection Time: 05/15/19  3:25 AM  Result Value Ref Range   Hgb A1c MFr Bld 7.1 (H) 4.8 - 5.6 %    Comment: (NOTE) Pre diabetes:          5.7%-6.4% Diabetes:              >6.4% Glycemic control for   <7.0% adults with diabetes    Mean Plasma Glucose 157.07 mg/dL    Comment: Performed at [facility] [address] [place], [place] [address]  Heparin level (unfractionated)     Status: Abnormal   Collection Time: 05/15/19  3:41 AM  Result Value Ref Range   Heparin Unfractionated >2.20 (H) 0.30 - 0.70 IU/mL    Comment: RESULTS CONFIRMED BY MANUAL DILUTION (NOTE) If heparin results are below expected values, and patient dosage has  been confirmed, suggest follow up testing of antithrombin III levels. Performed at [facility], [place] [address], [place], [place] [address]   APTT     Status: None   Collection Time: 05/15/19  3:41 AM  Result Value Ref Range   aPTT 36 24 - 36 seconds    Comment: Performed at [facility], [place] [address] [place], [place] [address]  Protime-INR     Status: Abnormal   Collection Time: 05/15/19  3:41 AM  Result Value Ref Range   Prothrombin Time 24.0 (H) 11.4 - 15.2 seconds   INR 2.2 (H) 0.8 - 1.2    Comment: (NOTE) INR goal varies based on device and disease states. Performed at [facility], [place] [address], [place],  [address]   Comprehensive metabolic panel     Status: Abnormal   Collection Time: 05/15/19  5:00 AM  Result Value Ref Range   Sodium 138 135 - 145 mmol/L   Potassium 4.7 3.5 - 5.1  mmol/L   Chloride 105 98 - 111 mmol/L   CO2 21 (L) 22 - 32 mmol/L   Glucose, Bld 202 (H) 70 - 99 mg/dL   BUN 58 (H) 8 - 23 mg/dL   Creatinine, Ser 2.32 (H) 0.44 - 1.00 mg/dL   Calcium 8.1 (L) 8.9 - 10.3 mg/dL   Total Protein 7.2 6.5 - 8.1 g/dL   Albumin 3.9 3.5 - 5.0 g/dL   AST 32 15 - 41 U/L   ALT  17 0 - 44 U/L   Alkaline Phosphatase 67 38 - 126 U/L   Total Bilirubin 0.8 0.3 - 1.2 mg/dL   GFR calc non Af Amer 19 (L) >60 mL/min   GFR calc Af Amer 22 (L) >60 mL/min   Anion gap 12 5 - 15    Comment: Performed at [facility] [place] [address] [place], [place] [address]  CBC WITH DIFFERENTIAL     Status: Abnormal   Collection Time: 05/15/19  5:00 AM  Result Value Ref Range   WBC 9.8 4.0 - 10.5 K/uL   RBC 3.00 (L) 3.87 - 5.11 MIL/uL   Hemoglobin 9.7 (L) 12.0 - 15.0 g/dL   HCT 30.2 (L) 36.0 - 46.0 %   MCV 100.7 (H) 80.0 - 100.0 fL   MCH 32.3 26.0 - 34.0 pg   MCHC 32.1 30.0 - 36.0 g/dL   RDW 12.9 11.5 - 15.5 %   Platelets 127 (L) 150 - 400 K/uL   nRBC 0.0 0.0 - 0.2 %   Neutrophils Relative % 85 %   Neutro Abs 8.4 (H) 1.7 - 7.7 K/uL   Lymphocytes Relative 8 %   Lymphs Abs 0.8 0.7 - 4.0 K/uL   Monocytes Relative 6 %   Monocytes Absolute 0.6 0.1 - 1.0 K/uL   Eosinophils Relative 0 %   Eosinophils Absolute 0.0 0.0 - 0.5 K/uL   Basophils Relative 0 %   Basophils Absolute 0.0 0.0 - 0.1 K/uL   Immature Granulocytes 1 %   Abs Immature Granulocytes 0.06 0.00 - 0.07 K/uL    Comment: Performed at [facility], [place] [address] [place], [place] [address]  CBG monitoring, ED     Status: Abnormal   Collection Time: 05/15/19  8:20 AM  Result Value Ref Range   Glucose-Capillary 202 (H) 70 - 99 mg/dL   Ct Head Wo Contrast  Result Date: 05/15/2019 CLINICAL DATA:  78 year old female with altered mental status. EXAM: CT HEAD WITHOUT CONTRAST TECHNIQUE: Contiguous axial images were obtained from the base of the skull through the vertex without intravenous  contrast. COMPARISON:  Head CT dated 04/01/2018 FINDINGS: Evaluation of this exam is limited due to motion artifact. Brain: There is mild age-related atrophy and moderate chronic microvascular ischemic changes. There is no acute intracranial hemorrhage. No mass effect or midline shift. No extra-axial fluid collection. Vascular: No hyperdense vessel or unexpected calcification. Skull: Normal. Negative for fracture or focal lesion. Sinuses/Orbits: No acute finding. Other: None IMPRESSION: 1. No acute intracranial hemorrhage. 2. Age-related atrophy and chronic microvascular ischemic changes. Electronically Signed   By: Anner Crete M.D.   On: 05/15/2019 00:05   Dg Chest Port 1 View  Result Date: 05/14/2019 CLINICAL DATA:  Weakness and fever EXAM: PORTABLE CHEST 1 VIEW COMPARISON:  None. FINDINGS: Left chest wall pacemaker leads are in expected position. There findings of median sternotomy and remote CABG. No focal airspace consolidation or pulmonary edema. No pleural effusion or pneumothorax. IMPRESSION: No active disease. Electronically Signed   By: Ulyses Jarred M.D.   On: 05/14/2019 21:36    Pending Labs Unresulted Labs (From admission, onward)    Start     Ordered   05/16/19 0500  CBC  Daily,   R     05/15/19 0910   05/15/19 1700  APTT  Once-Timed,   STAT     05/15/19 0900   05/14/19 2350  Novel Coronavirus,NAA,(SEND-OUT TO REF LAB - TAT 24-48 hrs); Hosp Order  (Symptomatic  Patients Labs with Precautions )  ONCE - STAT,   STAT    Question Answer Comment  Current symptoms Fever and Cough   Excluded other viral illnesses Yes   Exposure Risk None      05/14/19 2350   05/14/19 2052  Urine culture  ONCE - STAT,   STAT     05/14/19 2053          Vitals/Pain Today's Vitals   05/15/19 1000 05/15/19 1030 05/15/19 1101 05/15/19 1144  BP: (!) 125/57 (!) 143/41 (!) 140/45   Pulse: 62 (!) 59 60   Resp: (!) 27 (!) 28 (!) 28   Temp:    (!) 101 F (38.3 C)  TempSrc:    Rectal  SpO2: 96% 93%  95%   PainSc:        Isolation Precautions Airborne and Contact precautions  Medications Medications  cefTRIAXone (ROCEPHIN) 1 g in sodium chloride 0.9 % 100 mL IVPB (0 g Intravenous Stopped 05/14/19 2321)  carvedilol (COREG) tablet 12.5 mg (has no administration in time range)  nitroGLYCERIN (NITROSTAT) SL tablet 0.4 mg (has no administration in time range)  Rosuvastatin Calcium CPSP 10 mg (has no administration in time range)  Insulin Glargine (LANTUS) Solostar Pen 22 Units (has no administration in time range)  levothyroxine (SYNTHROID) tablet 88 mcg (88 mcg Oral Given 05/15/19 0959)  docusate sodium (COLACE) capsule 100 mg (has no administration in time range)  clopidogrel (PLAVIX) tablet 75 mg (has no administration in time range)  vitamin B-12 (CYANOCOBALAMIN) tablet 100 mcg (has no administration in time range)  ferrous sulfate EC tablet 325 mg (has no administration in time range)  acetaminophen (TYLENOL) tablet 650 mg (has no administration in time range)    Or  acetaminophen (TYLENOL) suppository 650 mg (has no administration in time range)  ondansetron (ZOFRAN) tablet 4 mg (has no administration in time range)    Or  ondansetron (ZOFRAN) injection 4 mg (has no administration in time range)  insulin aspart (novoLOG) injection 0-9 Units (has no administration in time range)  heparin ADULT infusion 100 units/mL (25000 units/257mL sodium chloride 0.45%) (800 Units/hr Intravenous New Bag/Given 05/15/19 0816)  sodium chloride 0.9 % bolus 1,000 mL (0 mLs Intravenous Stopped 05/14/19 2321)    And  sodium chloride 0.9 % bolus 500 mL (0 mLs Intravenous Stopped 05/14/19 2322)    And  sodium chloride 0.9 % bolus 250 mL (0 mLs Intravenous Stopped 05/14/19 2321)  acetaminophen (TYLENOL) tablet 650 mg (650 mg Oral Given 05/14/19 2349)    Mobility walks with person assist Moderate fall risk   Focused Assessments Neuro Assessment Handoff:  Swallow screen pass? Yes  Cardiac Rhythm:  Normal sinus rhythm       Neuro Assessment:   Neuro Checks:      Last Documented NIHSS Modified Score:   Has TPA been given? No If patient is a Neuro Trauma and patient is going to OR before floor call report to [place] nurse: [phone] or [phone]     R Recommendations: See Admitting Provider Note  Report given to:   Additional Notes:

## 2019-05-15 NOTE — Progress Notes (Signed)
Reserve for Heparin Indication: atrial fibrillation on PTA Xarelto  Allergies  Allergen Reactions  . Clonidine Derivatives Other (See Comments)    Bradycardia and fatigue   . Sulfa Antibiotics Other (See Comments)    Unknown  . Crestor [Rosuvastatin Calcium] Other (See Comments)    Other reaction(s): tired and weak  . Epinephrine Other (See Comments)    Abnormal feeling. Dental exam/injection of local w/ epi.  Marland Kitchen Hydralazine Other (See Comments)    Nausea/gi upset   . Losartan Other (See Comments)    Hyperkalemia   . Other Other (See Comments)    MANGO'S - WELTS ALL OVER   Patient Measurements:   Heparin Dosing Weight: no current weight, last weight 63.1 kg on 11/27/2018  Vital Signs: Temp: 99.5 F (37.5 C) (07/21 0612) Temp Source: Oral (07/21 0612) BP: 169/48 (07/21 0800) Pulse Rate: 65 (07/21 0800)  Labs: Recent Labs    05/14/19 2052 05/15/19 0341 05/15/19 0500  HGB 9.8*  --  9.7*  HCT 30.0*  --  30.2*  PLT 152  --  127*  APTT  --  36  --   LABPROT  --  24.0*  --   INR  --  2.2*  --   HEPARINUNFRC  --  >2.20*  --   CREATININE 2.18*  --  2.32*   CrCl cannot be calculated (Unknown ideal weight.).  Medical History: Past Medical History:  Diagnosis Date  . Anemia   . Anxiety   . Arthritis    "in my hands; knees, back" (09/27/2018)  . CAD (coronary artery disease)    a. 40-59% bilaterally 10/2015.  Marland Kitchen Chronic diastolic CHF (congestive heart failure) ([place])    a. 05/2016 Echo: EF 60-65%, no rwma, Gr1 DD, Ao sclerosis w/o stenosis, triv MR;  b. 07/2016 TEE: EF 55-60%, no rwma, mild MR.  . CKD (chronic kidney disease), stage III ([place])   . Coronary artery disease    a. 02/2007 Persantine MV: low risk;  b. 11/2011 CABG x 3 (LIMA->LAD, VG->OM, VG->RCA);  c. 05/2016 MV: EF >65%, no isch/infarct, horiz ST dep in I, II, V5-V6.  Marland Kitchen Depression   . Diverticulosis   . Esophageal stricture   . GERD (gastroesophageal reflux disease)   .  Hemorrhoids   . Hiatal hernia   . Hyperkalemia    a. ARB stopped due to this.  . Hyperlipidemia   . Hypertension   . Hypertensive heart disease   . Hypothyroidism   . Mild cognitive impairment    a. seen by neurology.  . Myocardial infarction ([place]) 12/07/2011  . PAF (paroxysmal atrial fibrillation) (HCC)    a. post-op CABG.  . Pain    RIGHT KNEE PAIN - TORN RIGHT MEDIAL MENISCUS  . Paroxysmal atrial flutter ([place])    a. 07/2016 s/p TEE & DCCV;  b. 07/2016 Recurrent PAFlutter req initiation of amio & PPM in setting of tachy-brady;  c. CHA2DS2VASc = 7-->Xarelto 15 mg QD.  Marland Kitchen Pneumonia    "twice" (09/27/2018)  . PONV (postoperative nausea and vomiting)   . Presence of permanent cardiac pacemaker 08/12/2016  . S/P CABG (coronary artery bypass graft), 12/04/11 12/07/2011   LIMA to LAD, SVG to OM, SVG to RCA  . Sinus bradycardia    a. not on BB due to this.  . Skin cancer    "face" (09/27/2018)  . Tachy-brady syndrome ([place])    a. 07/2016 Jxnl brady following DCCV, recurrent Aflutter-->amio + SJM 2272 Assurity  MRI DC PPM (ser # [id number]).  . Type II diabetes mellitus ([place])    Medications:  Scheduled:  . carvedilol  12.5 mg Oral BID  . clopidogrel  75 mg Oral Daily  . ferrous sulfate  325 mg Oral Q breakfast  . insulin aspart  0-9 Units Subcutaneous TID WC  . Insulin Glargine  22 Units Subcutaneous Daily  . levothyroxine  88 mcg Oral Q0600  . Rosuvastatin Calcium  10 mg Oral Daily  . vitamin B-12  100 mcg Oral Daily   Infusions:  . cefTRIAXone (ROCEPHIN)  IV Stopped (05/14/19 2321)  . heparin 800 Units/hr (05/15/19 0816)    Assessment: 3 yoF with fever, weakness on PTA xarelto for A-fib. LD 7/20 at unknown time  Baseline labs:  H/H=9.8/30, plts = 152, aptt=36   , INR= 2.2 , HL= artificially elevated at > 2.2, due to Xarelto Will use aptt to follow as HL is falsely elevated d/t xarelto  Goal of Therapy:  Heparin level 0.3-0.7 units/ml Heparin level 66-102 units/ml Monitor  platelets by anticoagulation protocol: Yes   Plan:  Start heparin drip at 800 units/hr at 0816  Daily CBC Check 1st aptt in 8 hours (1700)  Minda Ditto PharmD 05/15/2019,8:05 AM

## 2019-05-15 NOTE — H&P (Signed)
History and Physical    [name] TMH:[id number] DOB: 1941-09-13 DOA: 05/14/2019  PCP: Leeroy Cha, MD  Patient coming from: Home.  History obtained from patient's daughter.  Chief Complaint: Confusion and fever.  HPI: [name] is a 78 y.o. female with history of CAD status post CABG and stenting, chronic diastolic CHF on torsemide, chronic kidney disease stage IV, chronic anemia A. fib on Xarelto, pacemaker placement was found to be confused last evening when patient's daughter went to have a routine check and check her medications.  Patient also ran a fever 102 F.  Did not have any nausea vomiting diarrhea or any productive cough or shortness of breath.  Given the altered mental status patient was brought to the ER.  ED Course: In the ER patient had a fever 102.9 F with chest x-ray and UA unremarkable.  Blood cultures were sent patient was given fluid bolus for possible sepsis.  Empirically started on ceftriaxone.  Initial COVID-19 test was negative.  CT head unremarkable.  Patient admitted for SIRS.  Review of Systems: As per HPI, rest all negative.   Past Medical History:  Diagnosis Date  . Anemia   . Anxiety   . Arthritis    "in my hands; knees, back" (09/27/2018)  . CAD (coronary artery disease)    a. 40-59% bilaterally 10/2015.  Marland Kitchen Chronic diastolic CHF (congestive heart failure) ([place])    a. 05/2016 Echo: EF 60-65%, no rwma, Gr1 DD, Ao sclerosis w/o stenosis, triv MR;  b. 07/2016 TEE: EF 55-60%, no rwma, mild MR.  . CKD (chronic kidney disease), stage III ([place])   . Coronary artery disease    a. 02/2007 Persantine MV: low risk;  b. 11/2011 CABG x 3 (LIMA->LAD, VG->OM, VG->RCA);  c. 05/2016 MV: EF >65%, no isch/infarct, horiz ST dep in I, II, V5-V6.  Marland Kitchen Depression   . Diverticulosis   . Esophageal stricture   . GERD (gastroesophageal reflux disease)   . Hemorrhoids   . Hiatal hernia   . Hyperkalemia    a. ARB stopped due to this.  . Hyperlipidemia   .  Hypertension   . Hypertensive heart disease   . Hypothyroidism   . Mild cognitive impairment    a. seen by neurology.  . Myocardial infarction ([place]) 12/07/2011  . PAF (paroxysmal atrial fibrillation) (HCC)    a. post-op CABG.  . Pain    RIGHT KNEE PAIN - TORN RIGHT MEDIAL MENISCUS  . Paroxysmal atrial flutter ([place])    a. 07/2016 s/p TEE & DCCV;  b. 07/2016 Recurrent PAFlutter req initiation of amio & PPM in setting of tachy-brady;  c. CHA2DS2VASc = 7-->Xarelto 15 mg QD.  Marland Kitchen Pneumonia    "twice" (09/27/2018)  . PONV (postoperative nausea and vomiting)   . Presence of permanent cardiac pacemaker 08/12/2016  . S/P CABG (coronary artery bypass graft), 12/04/11 12/07/2011   LIMA to LAD, SVG to OM, SVG to RCA  . Sinus bradycardia    a. not on BB due to this.  . Skin cancer    "face" (09/27/2018)  . Tachy-brady syndrome ([place])    a. 07/2016 Jxnl brady following DCCV, recurrent Aflutter-->amio + SJM 2272 Assurity MRI DC PPM (ser # [id number]).  . Type II diabetes mellitus ([place])     Past Surgical History:  Procedure Laterality Date  . ABDOMINAL HYSTERECTOMY  1980's  . ANKLE FRACTURE SURGERY Right    "put pins both side right ankle"  . BACK SURGERY  2006   "  cyst growing near my spine"  . CARDIAC CATHETERIZATION  12/02/2011   mild LV dysfunction with mod hypocontractility of mid-distal anterolateral wall; CAD w/ostial tapering of L [place] with 50% diffuse ostial narrowing of LAD, 99% eccentric focal prox LAD stenosis followed by 70% prox LAD stenosis after 1st diag, 20% mid LAD narrowing; 80% ostial-to-prox L Cfx stenosis & 40-50% irregularity of RCA (Dr. Corky Downs)  . CARDIOVERSION N/A 08/11/2016   Procedure: CARDIOVERSION;  Surgeon: Lelon Perla, MD;  Location: [facility];  Service: Cardiovascular;  Laterality: N/A;  . CATARACT EXTRACTION W/ INTRAOCULAR LENS  IMPLANT, BILATERAL Bilateral ~ 2010  . [place]  . CORONARY ARTERY BYPASS GRAFT  12/04/2011   Procedure: CORONARY ARTERY  BYPASS GRAFTING (CABG);  Surgeon: Tharon Aquas Adelene Idler, MD;  Location: [place];  Service: Open Heart Surgery;  Laterality: N/A;  CABG x three,  using left internal mammary artery, and right leg greater saphenous vein harvested endoscopically  . CORONARY STENT INTERVENTION N/A 09/27/2018   Procedure: CORONARY STENT INTERVENTION;  Surgeon: Troy Sine, MD;  Location: [place] CV LAB;  Service: Cardiovascular;  Laterality: N/A;  . DILATION AND CURETTAGE OF UTERUS     "a couple times"  . EP IMPLANTABLE DEVICE N/A 08/12/2016   Procedure: Pacemaker Implant;  Surgeon: Will Meredith Leeds, MD;  Location: [place] CV [facility];  Service: Cardiovascular;  Laterality: N/A;  . ESOPHAGOGASTRODUODENOSCOPY (EGD) WITH ESOPHAGEAL DILATION    . FRACTURE SURGERY    . JOINT REPLACEMENT    . KNEE ARTHROSCOPY WITH MEDIAL MENISECTOMY Right 07/02/2014   Procedure: RIGHT KNEE ARTHROSCOPY WITH PARTIAL MEDIAL MENISTECTOMY, ABRASION CONDROPLASTYU OF PATELLA,ABRASION CONDROPLASTY OF MEDIAL FEMEROL CONDYL, MICROFRACTURE OF MEDIAL FEMEROL CONDYL;  Surgeon: Tobi Bastos, MD;  Location: [facility];  Service: Orthopedics;  Laterality: Right;  . LEFT HEART CATH AND CORS/GRAFTS ANGIOGRAPHY N/A 09/06/2018   Procedure: LEFT HEART CATH AND CORS/GRAFTS ANGIOGRAPHY;  Surgeon: Troy Sine, MD;  Location: [place] [facility];  Service: Cardiovascular;  Laterality: N/A;  . LEFT HEART CATHETERIZATION WITH CORONARY ANGIOGRAM N/A 12/02/2011   Procedure: LEFT HEART CATHETERIZATION WITH CORONARY ANGIOGRAM;  Surgeon: Troy Sine, MD;  Location: [facility] CATH LAB;  Service: Cardiovascular;  Laterality: N/A;  Coronary angiogram, possible PCI  . TEE WITHOUT CARDIOVERSION N/A 08/11/2016   Procedure: TRANSESOPHAGEAL ECHOCARDIOGRAM (TEE);  Surgeon: Lelon Perla, MD;  Location: [place];  Service: Cardiovascular;  Laterality: N/A;  . TONSILLECTOMY  1949  . TOTAL KNEE ARTHROPLASTY Left ~ 2006  . TRANSTHORACIC ECHOCARDIOGRAM  02/19/2013   EF 55-60%,  grade 1 diastolic dysfunction; mildly thickend/calcified AV leaflets; mildly calcidied MV annulus; mild TR     reports that she has never smoked. She has never used smokeless tobacco. She reports previous alcohol use. She reports that she does not use drugs.  Allergies  Allergen Reactions  . Clonidine Derivatives Other (See Comments)    Bradycardia and fatigue   . Sulfa Antibiotics Other (See Comments)    Unknown  . Crestor [Rosuvastatin Calcium] Other (See Comments)    Other reaction(s): tired and weak  . Epinephrine Other (See Comments)    Abnormal feeling. Dental exam/injection of local w/ epi.  Marland Kitchen Hydralazine Other (See Comments)    Nausea/gi upset   . Losartan Other (See Comments)    Hyperkalemia   . Other Other (See Comments)    MANGO'S - WELTS ALL OVER    Family History  Problem Relation Age of Onset  . Diabetes Mother   .  CVA Mother   . Hypertension Mother   . Heart disease Father   . Hyperlipidemia Father   . Breast cancer Sister        x 3  . Heart disease Brother        x5; one with MI  . Heart disease Sister        x3  . Diabetes Sister        x3  . Lung cancer Sister   . Breast cancer Sister        x2  . Colon cancer Neg Hx     Prior to Admission medications   Medication Sig Start Date End Date Taking? Authorizing Provider  acetaminophen (TYLENOL) 650 MG CR tablet Take 650 mg by mouth every 8 (eight) hours as needed for pain.   Yes [provider]  Ascorbic Acid (VITAMIN C PO) Take 1 tablet by mouth 3 (three) times a week.   Yes [provider]  carvedilol (COREG) 12.5 MG tablet Take 1 tablet (12.5 mg total) by mouth 2 (two) times daily. 02/13/19 05/14/19 Yes Camnitz, Will Hassell Done, MD  clopidogrel (PLAVIX) 75 MG tablet Take 1 tablet (75 mg total) by mouth daily. 09/06/18 09/06/19 Yes Kathyrn Drown D, NP  Cyanocobalamin (VITAMIN B-12 PO) Take 1 tablet by mouth 3 (three) times a week.    Yes [provider]  docusate sodium  (COLACE) 100 MG capsule Take 100 mg by mouth 2 (two) times daily as needed for mild constipation or moderate constipation.   Yes [provider]  ferrous sulfate 325 (65 FE) MG EC tablet Take 325 mg by mouth daily with breakfast.   Yes [provider]  gabapentin (NEURONTIN) 300 MG capsule Take 300 mg by mouth 2 (two) times daily.    Yes [provider]  LANTUS SOLOSTAR 100 UNIT/ML Solostar Pen Inject 22 Units into the skin daily. 08/08/18  Yes [provider]  levothyroxine (SYNTHROID) 88 MCG tablet Take 88 mcg by mouth daily before breakfast.    Yes [provider]  meclizine (ANTIVERT) 25 MG tablet Take 25 mg by mouth daily as needed for dizziness. For dizziness   Yes [provider]  Multiple Vitamins-Minerals (PRESERVISION AREDS 2 PO) Take 1 tablet by mouth every evening.    Yes [provider]  nitroGLYCERIN (NITROSTAT) 0.4 MG SL tablet Place 1 tablet (0.4 mg total) under the tongue every 5 (five) minutes as needed for chest pain. 08/15/18 05/14/19 Yes Lendon Colonel, NP  Polyethyl Glycol-Propyl Glycol (SYSTANE OP) Place 1 drop into both eyes 2 (two) times daily.    Yes [provider]  Rosuvastatin Calcium 10 MG CPSP Take 10 mg by mouth daily.    Yes [provider]  torsemide (DEMADEX) 20 MG tablet Take 40 mg by mouth every morning. 12/20/17  Yes [provider]  XARELTO 15 MG TABS tablet TAKE 1 TABLET BY MOUTH ONCE DAILY WITH SUPPER Patient taking differently: Take 15 mg by mouth daily.  03/14/19  Yes Hilty, Nadean Corwin, MD  esomeprazole (NEXIUM) 20 MG capsule TAKE 1 CAPSULE BY MOUTH ONCE DAILY AT  12  NOON Patient not taking: Reported on 05/14/2019 08/14/18   Irene Shipper, MD  isosorbide mononitrate (IMDUR) 30 MG 24 hr tablet Take 1 tablet (30 mg total) by mouth daily. Patient not taking: Reported on 05/14/2019 01/22/19 04/22/19  [name], MD    Physical Exam: Constitutional: Moderately built  and nourished. Vitals:   05/15/19 0030  05/15/19 0100 05/15/19 0144 05/15/19 0230  BP: 140/63 124/79 (!) 123/44 (!) 145/53  Pulse: 63 63 64 60  Resp: (!) 27 19 19  (!) 21  Temp:    98.6 F (37 C)  TempSrc:    Oral  SpO2: 96% 93% 93% 95%   Eyes: Anicteric no pallor. ENMT: No discharge from the ears eyes nose and mouth. Neck: No mass felt.  No neck rigidity. Respiratory: No rhonchi or crepitations. Cardiovascular: S1-S2 heard. Abdomen: Soft nontender bowel sounds present. Musculoskeletal: No edema.  No joint effusion. Skin: No rash. Neurologic: Alert awake oriented to her name and place.  Moves all extremities. Psychiatric: Oriented to her name and place.   Labs on Admission: I have personally reviewed following labs and imaging studies  CBC: Recent Labs  Lab 05/14/19 2052  WBC 7.7  NEUTROABS 6.4  HGB 9.8*  HCT 30.0*  MCV 98.7  PLT 696   Basic Metabolic Panel: Recent Labs  Lab 05/14/19 2052  NA 137  K 4.3  CL 103  CO2 22  GLUCOSE 197*  BUN 59*  CREATININE 2.18*  CALCIUM 8.7*   GFR: CrCl cannot be calculated (Unknown ideal weight.). Liver Function Tests: Recent Labs  Lab 05/14/19 2052  AST 30  ALT 17  ALKPHOS 69  BILITOT 0.8  PROT 7.6  ALBUMIN 4.1   No results for input(s): LIPASE, AMYLASE in the last 168 hours. No results for input(s): AMMONIA in the last 168 hours. Coagulation Profile: No results for input(s): INR, PROTIME in the last 168 hours. Cardiac Enzymes: No results for input(s): CKTOTAL, CKMB, CKMBINDEX, TROPONINI in the last 168 hours. BNP (last 3 results) No results for input(s): PROBNP in the last 8760 hours. HbA1C: No results for input(s): HGBA1C in the last 72 hours. CBG: No results for input(s): GLUCAP in the last 168 hours. Lipid Profile: No results for input(s): CHOL, HDL, LDLCALC, TRIG, CHOLHDL, LDLDIRECT in the last 72 hours. Thyroid Function Tests: No results for input(s): TSH, T4TOTAL, FREET4, T3FREE, THYROIDAB in the last  72 hours. Anemia Panel: No results for input(s): VITAMINB12, FOLATE, FERRITIN, TIBC, IRON, RETICCTPCT in the last 72 hours. Urine analysis:    Component Value Date/Time   COLORURINE STRAW (A) 05/14/2019 2052   APPEARANCEUR CLEAR 05/14/2019 2052   LABSPEC 1.008 05/14/2019 2052   PHURINE 5.0 05/14/2019 2052   GLUCOSEU NEGATIVE 05/14/2019 2052   HGBUR NEGATIVE 05/14/2019 2052   BILIRUBINUR NEGATIVE 05/14/2019 2052   KETONESUR NEGATIVE 05/14/2019 2052   PROTEINUR NEGATIVE 05/14/2019 2052   UROBILINOGEN 0.2 01/04/2012 1105   NITRITE NEGATIVE 05/14/2019 2052   LEUKOCYTESUR NEGATIVE 05/14/2019 2052   Sepsis Labs: @LABRCNTIP (procalcitonin:4,lacticidven:4) ) Recent Results (from the past 240 hour(s))  SARS Coronavirus 2 (CEPHEID- Performed in [place] hospital lab), Hosp Order     Status: None   Collection Time: 05/14/19  8:55 PM   Specimen: Nasopharyngeal Swab  Result Value Ref Range Status   SARS Coronavirus 2 NEGATIVE NEGATIVE Final    Comment: (NOTE) If result is NEGATIVE SARS-CoV-2 target nucleic acids are NOT DETECTED. The SARS-CoV-2 RNA is generally detectable in upper and lower  respiratory specimens during the acute phase of infection. The lowest  concentration of SARS-CoV-2 viral copies this assay can detect is 250  copies / mL. A negative result does not preclude SARS-CoV-2 infection  and should not be used as the sole basis for treatment or other  patient management decisions.  A negative result may occur with  improper specimen collection / handling,  submission of specimen other  than nasopharyngeal swab, presence of viral mutation(s) within the  areas targeted by this assay, and inadequate number of viral copies  (<250 copies / mL). A negative result must be combined with clinical  observations, patient history, and epidemiological information. If result is POSITIVE SARS-CoV-2 target nucleic acids are DETECTED. The SARS-CoV-2 RNA is generally detectable in upper  and lower  respiratory specimens dur ing the acute phase of infection.  Positive  results are indicative of active infection with SARS-CoV-2.  Clinical  correlation with patient history and other diagnostic information is  necessary to determine patient infection status.  Positive results do  not rule out bacterial infection or co-infection with other viruses. If result is PRESUMPTIVE POSTIVE SARS-CoV-2 nucleic acids MAY BE PRESENT.   A presumptive positive result was obtained on the submitted specimen  and confirmed on repeat testing.  While 2019 novel coronavirus  (SARS-CoV-2) nucleic acids may be present in the submitted sample  additional confirmatory testing may be necessary for epidemiological  and / or clinical management purposes  to differentiate between  SARS-CoV-2 and other Sarbecovirus currently known to infect humans.  If clinically indicated additional testing with an alternate test  methodology [phone]) is advised. The SARS-CoV-2 RNA is generally  detectable in upper and lower respiratory sp ecimens during the acute  phase of infection. The expected result is Negative. Fact Sheet for Patients:  StrictlyIdeas.no Fact Sheet for Healthcare Providers: BankingDealers.co.za This test is not yet approved or cleared by the [place] FDA and has been authorized for detection and/or diagnosis of SARS-CoV-2 by FDA under an Emergency Use Authorization (EUA).  This EUA will remain in effect (meaning this test can be used) for the duration of the COVID-19 declaration under Section 564(b)(1) of the Act, 21 U.S.C. section 360bbb-3(b)(1), unless the authorization is terminated or revoked sooner. Performed at [facility], [place] [address], [address], [place] [address]      Radiological Exams on Admission: Ct Head Wo Contrast  Result Date: 05/15/2019 CLINICAL DATA:  78 year old female with altered mental status.  EXAM: CT HEAD WITHOUT CONTRAST TECHNIQUE: Contiguous axial images were obtained from the base of the skull through the vertex without intravenous contrast. COMPARISON:  Head CT dated 04/01/2018 FINDINGS: Evaluation of this exam is limited due to motion artifact. Brain: There is mild age-related atrophy and moderate chronic microvascular ischemic changes. There is no acute intracranial hemorrhage. No mass effect or midline shift. No extra-axial fluid collection. Vascular: No hyperdense vessel or unexpected calcification. Skull: Normal. Negative for fracture or focal lesion. Sinuses/Orbits: No acute finding. Other: None IMPRESSION: 1. No acute intracranial hemorrhage. 2. Age-related atrophy and chronic microvascular ischemic changes. Electronically Signed   By: Anner Crete M.D.   On: 05/15/2019 00:05   Dg Chest Port 1 View  Result Date: 05/14/2019 CLINICAL DATA:  Weakness and fever EXAM: PORTABLE CHEST 1 VIEW COMPARISON:  None. FINDINGS: Left chest wall pacemaker leads are in expected position. There findings of median sternotomy and remote CABG. No focal airspace consolidation or pulmonary edema. No pleural effusion or pneumothorax. IMPRESSION: No active disease. Electronically Signed   By: Ulyses Jarred M.D.   On: 05/14/2019 21:36     Assessment/Plan Principal Problem:   SIRS (systemic inflammatory response syndrome) (HCC) Active Problems:   Diabetes mellitus, type 2 (HCC)   Hyperlipidemia LDL goal <70   S/P CABG (coronary artery bypass graft), 12/04/11   Essential hypertension   Anemia of chronic disease  CKD (chronic kidney disease) stage 4, GFR 15-29 ml/min (HCC)   Chronic diastolic CHF (congestive heart failure) (HCC)   CAD S/P percutaneous coronary angioplasty   Fever   Acute encephalopathy    1. SIRS -source not clear.  Patient was empirically started on ceftriaxone which has been continued.  Follow cultures.  Follow second COVID-19 test which has been sent out.  Will hold  gabapentin given the confusion. 2. Acute encephalopathy likely from fever.  CT head unremarkable.  Closely observe.  Presently oriented to her name and place. 3. Diabetes mellitus type 2 on Lantus insulin 22 units.  Sliding scale coverage. 4. CAD status post CABG and stenting denies any chest pain.  On Plavix carvedilol statins. 5. History of paroxysmal atrial fibrillation on Xarelto.  I will place patient on heparin for now in anticipation of possible procedure.  On beta-blockers. 6. Chronic diastolic CHF holding torsemide for now.  Closely observe respiratory status.  Did receive some fluids in the ER for sepsis protocol.  I have not continued any IV fluid infusion for now. 7. Chronic kidney disease stage IV creatinine appears to be at baseline. 8. Chronic anemia likely from renal disease hemoglobin appears to be at baseline.  Follow CBC.  On B12 and iron supplements. 9. History of pacemaker placement.   DVT prophylaxis: Heparin. Code Status: Full code confirmed with patient's daughter. Family Communication: Patient's daughter. Disposition Plan: Home. Consults called: None. Admission status: Observation.   Rise [name] MD [facility] Pager [phone].  If 7PM-7AM, please contact night-coverage [email or web address] Password [facility]  05/15/2019, 3:23 AM

## 2019-05-15 NOTE — Progress Notes (Signed)
RN got report at 1200. Patient was transferred to 4W at 1234. Alert and oriented x 4. No pain complained. Vital signs was taken. Call light was in patient's reach. RN called and update to patient's daughter as well. Heparin drip at 65ml/hr. No active bleeding.

## 2019-05-15 NOTE — Progress Notes (Signed)
[name] is a 78 y.o. female with history of CAD status post CABG and stenting, chronic diastolic CHF on torsemide, chronic kidney disease stage IV, chronic anemia A. fib on Xarelto, pacemaker placement was found to be confused last evening when patient's daughter went to have a routine check and check her medications.  Patient also ran a fever 102 F.  Pt seen and examined at bedside with full PPE.    A/P 1. Fever of unclear etiology.  covid 19 screen negative, but a second test sent out, and pending.   2. Empiric antibiotics with rocephin till cultures come back.   3. PT/OT Evaluation.    Hosie Poisson, MD [phone]

## 2019-05-16 ENCOUNTER — Other Ambulatory Visit (HOSPITAL_COMMUNITY): Payer: Medicare Other

## 2019-05-16 DIAGNOSIS — Z950 Presence of cardiac pacemaker: Secondary | ICD-10-CM

## 2019-05-16 DIAGNOSIS — W5503XA Scratched by cat, initial encounter: Secondary | ICD-10-CM

## 2019-05-16 DIAGNOSIS — I2510 Atherosclerotic heart disease of native coronary artery without angina pectoris: Secondary | ICD-10-CM

## 2019-05-16 DIAGNOSIS — R413 Other amnesia: Secondary | ICD-10-CM

## 2019-05-16 DIAGNOSIS — S80811A Abrasion, right lower leg, initial encounter: Secondary | ICD-10-CM

## 2019-05-16 DIAGNOSIS — Z794 Long term (current) use of insulin: Secondary | ICD-10-CM

## 2019-05-16 DIAGNOSIS — N184 Chronic kidney disease, stage 4 (severe): Secondary | ICD-10-CM

## 2019-05-16 DIAGNOSIS — I495 Sick sinus syndrome: Secondary | ICD-10-CM

## 2019-05-16 DIAGNOSIS — Z951 Presence of aortocoronary bypass graft: Secondary | ICD-10-CM

## 2019-05-16 DIAGNOSIS — H9190 Unspecified hearing loss, unspecified ear: Secondary | ICD-10-CM

## 2019-05-16 DIAGNOSIS — A419 Sepsis, unspecified organism: Secondary | ICD-10-CM

## 2019-05-16 DIAGNOSIS — Z95820 Peripheral vascular angioplasty status with implants and grafts: Secondary | ICD-10-CM

## 2019-05-16 DIAGNOSIS — F419 Anxiety disorder, unspecified: Secondary | ICD-10-CM

## 2019-05-16 DIAGNOSIS — Z888 Allergy status to other drugs, medicaments and biological substances status: Secondary | ICD-10-CM

## 2019-05-16 DIAGNOSIS — S80812A Abrasion, left lower leg, initial encounter: Secondary | ICD-10-CM

## 2019-05-16 DIAGNOSIS — R509 Fever, unspecified: Secondary | ICD-10-CM

## 2019-05-16 DIAGNOSIS — D696 Thrombocytopenia, unspecified: Secondary | ICD-10-CM

## 2019-05-16 DIAGNOSIS — Z881 Allergy status to other antibiotic agents status: Secondary | ICD-10-CM

## 2019-05-16 DIAGNOSIS — Z91018 Allergy to other foods: Secondary | ICD-10-CM

## 2019-05-16 DIAGNOSIS — R7881 Bacteremia: Secondary | ICD-10-CM | POA: Insufficient documentation

## 2019-05-16 DIAGNOSIS — E1122 Type 2 diabetes mellitus with diabetic chronic kidney disease: Secondary | ICD-10-CM

## 2019-05-16 LAB — APTT
aPTT: 139 seconds — ABNORMAL HIGH (ref 24–36)
aPTT: 40 seconds — ABNORMAL HIGH (ref 24–36)
aPTT: 62 seconds — ABNORMAL HIGH (ref 24–36)

## 2019-05-16 LAB — CBC
HCT: 28.4 % — ABNORMAL LOW (ref 36.0–46.0)
Hemoglobin: 9 g/dL — ABNORMAL LOW (ref 12.0–15.0)
MCH: 32.3 pg (ref 26.0–34.0)
MCHC: 31.7 g/dL (ref 30.0–36.0)
MCV: 101.8 fL — ABNORMAL HIGH (ref 80.0–100.0)
Platelets: 100 10*3/uL — ABNORMAL LOW (ref 150–400)
RBC: 2.79 MIL/uL — ABNORMAL LOW (ref 3.87–5.11)
RDW: 13.1 % (ref 11.5–15.5)
WBC: 5.7 10*3/uL (ref 4.0–10.5)
nRBC: 0 % (ref 0.0–0.2)

## 2019-05-16 LAB — HEPARIN LEVEL (UNFRACTIONATED): Heparin Unfractionated: 1.12 IU/mL — ABNORMAL HIGH (ref 0.30–0.70)

## 2019-05-16 LAB — GLUCOSE, CAPILLARY
Glucose-Capillary: 82 mg/dL (ref 70–99)
Glucose-Capillary: 136 mg/dL — ABNORMAL HIGH (ref 70–99)
Glucose-Capillary: 138 mg/dL — ABNORMAL HIGH (ref 70–99)
Glucose-Capillary: 149 mg/dL — ABNORMAL HIGH (ref 70–99)

## 2019-05-16 MED ORDER — VANCOMYCIN HCL IN DEXTROSE 750-5 MG/150ML-% IV SOLN
750.0000 mg | INTRAVENOUS | Status: DC
  Administered 2019-05-18 – 2019-05-22 (×3): 750 mg via INTRAVENOUS
  Filled 2019-05-16 (×3): qty 150

## 2019-05-16 MED ORDER — SODIUM CHLORIDE 0.9 % IV SOLN: INTRAVENOUS | Status: DC | PRN

## 2019-05-16 MED ORDER — HEPARIN (PORCINE) 25000 UT/250ML-% IV SOLN
700.0000 [IU]/h | INTRAVENOUS | Status: DC
  Administered 2019-05-16 – 2019-05-18 (×2): 700 [IU]/h via INTRAVENOUS
  Filled 2019-05-16: qty 250

## 2019-05-16 MED ORDER — HEPARIN (PORCINE) 25000 UT/250ML-% IV SOLN
500.0000 [IU]/h | INTRAVENOUS | Status: DC
  Administered 2019-05-16: 500 [IU]/h via INTRAVENOUS
  Filled 2019-05-16: qty 250

## 2019-05-16 MED ORDER — VANCOMYCIN HCL 10 G IV SOLR
1250.0000 mg | Freq: Once | INTRAVENOUS | Status: AC
  Administered 2019-05-16: 1250 mg via INTRAVENOUS
  Filled 2019-05-16 (×2): qty 1250

## 2019-05-16 NOTE — Progress Notes (Signed)
RN report received from [place] with previous assessment of patient. Updated daughter [name], who also request an MD update. Dr. Erlinda Hong paged with request.

## 2019-05-16 NOTE — Consult Note (Signed)
[name]       Reason for Consult:fever, CoNS sepsis    Referring Physician: Florencia Reasons, MD  Principal Problem:   SIRS (systemic inflammatory response syndrome) (HCC) Active Problems:   Diabetes mellitus, type 2 ([place])   Hyperlipidemia LDL goal <70   S/P CABG (coronary artery bypass graft), 12/04/11   Essential hypertension   Anemia of chronic disease   CKD (chronic kidney disease) stage 4, GFR 15-29 ml/min (HCC)   Chronic diastolic CHF (congestive heart failure) (HCC)   CAD S/P percutaneous coronary angioplasty   Fever   Acute encephalopathy   . carvedilol  12.5 mg Oral BID  . clopidogrel  75 mg Oral Daily  . ferrous sulfate  325 mg Oral Q breakfast  . insulin aspart  0-9 Units Subcutaneous TID WC  . insulin glargine  22 Units Subcutaneous Daily  . levothyroxine  88 mcg Oral Q0600  . rosuvastatin  10 mg Oral Daily  . vitamin B-12  100 mcg Oral Daily    Recommendations: 1. CoNS sepsis - continue vanc dosed for goal trough of 15-20. D/c rocephin. Check TTE to R/O IE or infection to PM. Repeat blood cxs x 2 tonight to assess for clearance of her BSI. Initial lactic acid was elevated at 2.2 but has returned to WNL with ABX and IVFs. Suspect her CoNS sepsis did arise from her recent cat scratches. I briefly educated pt on this risk and counselled her on methods to lower the risk of acquiring injury from the cat's claws, including wearing longer pants, providing adequate toys for the cat to play with, and using bitter apple spray to her skin to train cat not to bite or scratch her skin.  2. CRI - Pt has known stage IV CRI per records reviewed. Cr fluctuates from 1.8-2.5 over the past year, so uncertain of true baseline Cr. Unfortunately, her infection necessitates vancomycin for now. Appreciate pharmacy assistance with dosing. Check BMP daily to follow Cr trend to assist with appropriate ABX dosing.  3. Thrombocytopenia - Uncertain if this is the  patient's baseline or if this is more due to her current sepsis. If she recovers, she may be a candidate for completion of ABX tx with oral zyvox, provided her thrombocytopenia resolves and her TTE shows no evidence of endocarditis on her valves or PM wires, either of which would indicate the need for prolonged parenteral ABX.  Assessment: The patient is a 78 y/o WF diabetic with CAD, s/p CABG and PTCI, tachy-brady syndrome, s/p PM, and stage IV CRI admitted with fever and confusion and probable CoNS sepsis.  Antibiotics: Vancomycin, day 1 Rocephin, day 3   HPI: [name] is a 78 y.o.white female diabetic with CAD, tachy-brady syndrome, s/p PM, and stage IV CRI who was admitted on 05/15/2019 with AMS and fever. Both of her initial blood cxs were positive for MR-CoNS. She denies any recent falls or blunt trauma but does typically ambulate with a cane or walker at home. She does live alone, but her daughter lives ~30 minutes away and either speaks to her mother daily on the phone or visits her in person. She has been self-quarantining appropriately at home during the CoVID-19 pandemic. Two weeks ago, she got a new kitten. She admits the kitten has frequently scratched at her in efforts to play. She recalls that one scratch to her LT ankle was particularly bad. There are several linear healing scratch marks along her legs, including to  her LT ankle. She was empirically started on rocephin on admission. Vancomycin was added following my discussion with Dr. Erlinda Hong this morning. A TTE is pending. Fever curve, labs, blood cxs, ABX usage, and imaging all independently reviewed  Review of Systems:  Review of Systems  Constitutional: Positive for fever. Negative for chills and weight loss.  HENT: Positive for hearing loss. Negative for congestion, sinus pain and sore throat.   Eyes: Negative for blurred vision, photophobia and discharge.  Respiratory: Negative for cough, hemoptysis and shortness of breath.    Cardiovascular: Negative for chest pain, palpitations, orthopnea and leg swelling.  Gastrointestinal: Negative for abdominal pain, constipation, diarrhea, heartburn, nausea and vomiting.  Genitourinary: Negative for dysuria, flank pain, frequency and urgency.  Musculoskeletal: Negative for back pain, joint pain and myalgias.  Skin: Negative for itching and rash.  Neurological: Positive for weakness. Negative for tremors, seizures and headaches.  Endo/Heme/Allergies: Negative for polydipsia. Does not bruise/bleed easily.  Psychiatric/Behavioral: Positive for memory loss. Negative for depression and substance abuse. The patient is nervous/anxious. The patient does not have insomnia.      All other systems reviewed and are negative    Past Medical History:  Diagnosis Date  . Anemia   . Anxiety   . Arthritis    "in my hands; knees, back" (09/27/2018)  . CAD (coronary artery disease)    a. 40-59% bilaterally 10/2015.  Marland Kitchen Chronic diastolic CHF (congestive heart failure) ([place])    a. 05/2016 Echo: EF 60-65%, no rwma, Gr1 DD, Ao sclerosis w/o stenosis, triv MR;  b. 07/2016 TEE: EF 55-60%, no rwma, mild MR.  . CKD (chronic kidney disease), stage III ([place])   . Coronary artery disease    a. 02/2007 Persantine MV: low risk;  b. 11/2011 CABG x 3 (LIMA->LAD, VG->OM, VG->RCA);  c. 05/2016 MV: EF >65%, no isch/infarct, horiz ST dep in I, II, V5-V6.  Marland Kitchen Depression   . Diverticulosis   . Esophageal stricture   . GERD (gastroesophageal reflux disease)   . Hemorrhoids   . Hiatal hernia   . Hyperkalemia    a. ARB stopped due to this.  . Hyperlipidemia   . Hypertension   . Hypertensive heart disease   . Hypothyroidism   . Mild cognitive impairment    a. seen by neurology.  . Myocardial infarction ([place]) 12/07/2011  . PAF (paroxysmal atrial fibrillation) (HCC)    a. post-op CABG.  . Pain    RIGHT KNEE PAIN - TORN RIGHT MEDIAL MENISCUS  . Paroxysmal atrial flutter ([place])    a. 07/2016 s/p TEE & DCCV;   b. 07/2016 Recurrent PAFlutter req initiation of amio & PPM in setting of tachy-brady;  c. CHA2DS2VASc = 7-->Xarelto 15 mg QD.  Marland Kitchen Pneumonia    "twice" (09/27/2018)  . PONV (postoperative nausea and vomiting)   . Presence of permanent cardiac pacemaker 08/12/2016  . S/P CABG (coronary artery bypass graft), 12/04/11 12/07/2011   LIMA to LAD, SVG to OM, SVG to RCA  . Sinus bradycardia    a. not on BB due to this.  . Skin cancer    "face" (09/27/2018)  . Tachy-brady syndrome ([place])    a. 07/2016 Jxnl brady following DCCV, recurrent Aflutter-->amio + SJM 2272 [employer]urity MRI DC PPM (ser # [id number]).  . Type II diabetes mellitus ([place])    Past Surgical History:  Procedure Laterality Date  . ABDOMINAL HYSTERECTOMY  1980's  . ANKLE FRACTURE SURGERY Right    "put pins both side right ankle"  .  BACK SURGERY  2006   "cyst growing near my spine"  . CARDIAC CATHETERIZATION  12/02/2011   mild LV dysfunction with mod hypocontractility of mid-distal anterolateral wall; CAD w/ostial tapering of L [place] with 50% diffuse ostial narrowing of LAD, 99% eccentric focal prox LAD stenosis followed by 70% prox LAD stenosis after 1st diag, 20% mid LAD narrowing; 80% ostial-to-prox L Cfx stenosis & 40-50% irregularity of RCA (Dr. Corky Downs)  . CARDIOVERSION N/A 08/11/2016   Procedure: CARDIOVERSION;  Surgeon: Lelon Perla, MD;  Location: [facility] ENDOSCOPY;  Service: Cardiovascular;  Laterality: N/A;  . CATARACT EXTRACTION W/ INTRAOCULAR LENS  IMPLANT, BILATERAL Bilateral ~ 2010  . [place]  . CORONARY ARTERY BYPASS GRAFT  12/04/2011   Procedure: CORONARY ARTERY BYPASS GRAFTING (CABG);  Surgeon: Tharon Aquas Adelene Idler, MD;  Location: [facility];  Service: Open Heart Surgery;  Laterality: N/A;  CABG x three,  using left internal mammary artery, and right leg greater saphenous vein harvested endoscopically  . CORONARY STENT INTERVENTION N/A 09/27/2018   Procedure: CORONARY STENT INTERVENTION;  Surgeon: Troy Sine, MD;   Location: [facility] CV LAB;  Service: Cardiovascular;  Laterality: N/A;  . DILATION AND CURETTAGE OF UTERUS     "a couple times"  . EP IMPLANTABLE DEVICE N/A 08/12/2016   Procedure: Pacemaker Implant;  Surgeon: Will Meredith Leeds, MD;  Location: [facility] CV LAB;  Service: Cardiovascular;  Laterality: N/A;  . ESOPHAGOGASTRODUODENOSCOPY (EGD) WITH ESOPHAGEAL DILATION    . FRACTURE SURGERY    . JOINT REPLACEMENT    . KNEE ARTHROSCOPY WITH MEDIAL MENISECTOMY Right 07/02/2014   Procedure: RIGHT KNEE ARTHROSCOPY WITH PARTIAL MEDIAL MENISTECTOMY, ABRASION CONDROPLASTYU OF PATELLA,ABRASION CONDROPLASTY OF MEDIAL FEMEROL CONDYL, MICROFRACTURE OF MEDIAL FEMEROL CONDYL;  Surgeon: Tobi Bastos, MD;  Location: [facility] ORS;  Service: Orthopedics;  Laterality: Right;  . LEFT HEART CATH AND CORS/GRAFTS ANGIOGRAPHY N/A 09/06/2018   Procedure: LEFT HEART CATH AND CORS/GRAFTS ANGIOGRAPHY;  Surgeon: Troy Sine, MD;  Location: [facility] CV LAB;  Service: Cardiovascular;  Laterality: N/A;  . LEFT HEART CATHETERIZATION WITH CORONARY ANGIOGRAM N/A 12/02/2011   Procedure: LEFT HEART CATHETERIZATION WITH CORONARY ANGIOGRAM;  Surgeon: Troy Sine, MD;  Location: [facility] CATH LAB;  Service: Cardiovascular;  Laterality: N/A;  Coronary angiogram, possible PCI  . TEE WITHOUT CARDIOVERSION N/A 08/11/2016   Procedure: TRANSESOPHAGEAL ECHOCARDIOGRAM (TEE);  Surgeon: Lelon Perla, MD;  Location: [facility];  Service: Cardiovascular;  Laterality: N/A;  . TONSILLECTOMY  1949  . TOTAL KNEE ARTHROPLASTY Left ~ 2006  . TRANSTHORACIC ECHOCARDIOGRAM  02/19/2013   EF 57-84%, grade 1 diastolic dysfunction; mildly thickend/calcified AV leaflets; mildly calcidied MV annulus; mild TR    Social History   Tobacco Use  . Smoking status: Never Smoker  . Smokeless tobacco: Never Used  Substance Use Topics  . Alcohol use: Not Currently    Comment: very occasional   . Drug use: Never    Family History  Problem Relation Age  of Onset  . Diabetes Mother   . CVA Mother   . Hypertension Mother   . Heart disease Father   . Hyperlipidemia Father   . Breast cancer Sister        x 3  . Heart disease Brother        x5; one with MI  . Heart disease Sister        x3  . Diabetes Sister        x3  . Lung  cancer Sister   . Breast cancer Sister        x2  . Colon cancer Neg Hx      Current Facility-Administered Medications:  .  0.9 %  sodium chloride infusion, , Intravenous, PRN, Florencia Reasons, MD .  acetaminophen (TYLENOL) tablet 650 mg, 650 mg, Oral, Q6H PRN, 650 mg at 05/15/19 2200 **OR** acetaminophen (TYLENOL) suppository 650 mg, 650 mg, Rectal, Q6H PRN, Rise [name], MD .  carvedilol (COREG) tablet 12.5 mg, 12.5 mg, Oral, BID, Rise [name], MD, 12.5 mg at 05/16/19 1017 .  cefTRIAXone (ROCEPHIN) 1 g in sodium chloride 0.9 % 100 mL IVPB, 1 g, Intravenous, Q24H, Rise [name], MD, Stopped at 05/15/19 2211 .  clopidogrel (PLAVIX) tablet 75 mg, 75 mg, Oral, Daily, Rise [name], MD, 75 mg at 05/16/19 1017 .  docusate sodium (COLACE) capsule 100 mg, 100 mg, Oral, BID PRN, Rise [name], MD .  ferrous sulfate tablet 325 mg, 325 mg, Oral, Q breakfast, Rise [name], MD, 325 mg at 05/16/19 1017 .  heparin ADULT infusion 100 units/mL (25000 units/269mL sodium chloride 0.45%), 500 Units/hr, Intravenous, Continuous, Dorrene German, RPH, Last Rate: 5 mL/hr at 05/16/19 0600, 500 Units/hr at 05/16/19 0600 .  insulin aspart (novoLOG) injection 0-9 Units, 0-9 Units, Subcutaneous, TID WC, Rise [name], MD, 1 Units at 05/16/19 1305 .  insulin glargine (LANTUS) injection 22 Units, 22 Units, Subcutaneous, Daily, Rise [name], MD, 22 Units at 05/16/19 1018 .  levothyroxine (SYNTHROID) tablet 88 mcg, 88 mcg, Oral, Q0600, Rise [name], MD, 88 mcg at 05/16/19 0519 .  nitroGLYCERIN (NITROSTAT) SL tablet 0.4 mg, 0.4 mg, Sublingual, Q5 min PRN, Rise [name], MD .   ondansetron [facility]) tablet 4 mg, 4 mg, Oral, Q6H PRN **OR** ondansetron (ZOFRAN) injection 4 mg, 4 mg, Intravenous, Q6H PRN, Rise [name], MD .  rosuvastatin (CRESTOR) tablet 10 mg, 10 mg, Oral, Daily, Gean Birchwood N, MD, 10 mg at 05/16/19 1017 .  vancomycin (VANCOCIN) 1,250 mg in sodium chloride 0.9 % 250 mL IVPB, 1,250 mg, Intravenous, Once, Minda Ditto, RPH .  [START ON 05/18/2019] vancomycin (VANCOCIN) IVPB 750 mg/150 ml premix, 750 mg, Intravenous, Q48H, Green, Terri L, RPH .  vitamin B-12 (CYANOCOBALAMIN) tablet 100 mcg, 100 mcg, Oral, Daily, Rise [name], MD, 100 mcg at 05/16/19 1017  Allergies  Allergen Reactions  . Clonidine Derivatives Other (See Comments)    Bradycardia and fatigue   . Sulfa Antibiotics Other (See Comments)    Unknown  . Crestor [Rosuvastatin Calcium] Other (See Comments)    Other reaction(s): tired and weak  . Epinephrine Other (See Comments)    Abnormal feeling. Dental exam/injection of local w/ epi.  Marland Kitchen Hydralazine Other (See Comments)    Nausea/gi upset   . Losartan Other (See Comments)    Hyperkalemia   . Other Other (See Comments)    MANGO'S - WELTS ALL OVER    Vitals:   05/16/19 0105 05/16/19 0522  BP:  (!) 142/58  Pulse:  (!) 59  Resp:  17  Temp: 99.5 F (37.5 C) 99 F (37.2 C)  SpO2:  98%     Physical Exam Gen: pleasant, NAD, A&Ox 3, extremely hard of hearing Head: NCAT, no temporal wasting evident EENT: PERRL, EOMI, MMM, adequate dentition Neck: supple, mild JVD CV: NRRR, no murmurs evident, PM palpable w/o tenderness or erythema overlying device Pulm: CTA bilaterally, no wheeze or retractions Abd: soft, NTND, +BS Extrems:  trace LE edema, 2+ pulses Skin: multiple linear scratches/healing lesions to both LEs, most notable along her lateral LT ankle, adequate skin turgor Neuro: CN II-XII grossly intact except extremely poor hearing as noted above, no focal neurologic deficits appreciated, gait was not  assessed, A&Ox 3   Lab Results  Component Value Date   WBC 5.7 05/16/2019   HGB 9.0 (L) 05/16/2019   HCT 28.4 (L) 05/16/2019   MCV 101.8 (H) 05/16/2019   PLT 100 (L) 05/16/2019    Lab Results  Component Value Date   CREATININE 2.32 (H) 05/15/2019   BUN 58 (H) 05/15/2019   NA 138 05/15/2019   K 4.7 05/15/2019   CL 105 05/15/2019   CO2 21 (L) 05/15/2019    Lab Results  Component Value Date   ALT 17 05/15/2019   AST 32 05/15/2019   ALKPHOS 67 05/15/2019     Microbiology: Recent Results (from the past 240 hour(s))  Blood Culture (routine x 2)     Status: Abnormal (Preliminary result)   Collection Time: 05/14/19  8:52 PM   Specimen: BLOOD RIGHT ARM  Result Value Ref Range Status   Specimen Description   Final    BLOOD RIGHT ARM Performed at [facility], [address], [place], [place] [address]    Special Requests   Final    BOTTLES DRAWN AEROBIC AND ANAEROBIC Blood Culture results may not be optimal due to an inadequate volume of blood received in culture bottles Performed at [facility] [address] [place], [place] [address]    Culture  Setup Time   Final    GRAM POSITIVE COCCI IN BOTH AEROBIC AND ANAEROBIC BOTTLES Organism ID to follow CRITICAL RESULT CALLED TO, READ BACK BY AND VERIFIED WITHChristean Grief PHARMD [id number] 05/15/19 A BROWNING Performed at [facility], [place] [address] [place], [place] [address]    Culture STAPHYLOCOCCUS SPECIES (COAGULASE NEGATIVE) (A)  Final   Report Status PENDING  Incomplete  Urine culture     Status: Abnormal   Collection Time: 05/14/19  8:52 PM   Specimen: In/Out Cath Urine  Result Value Ref Range Status   Specimen Description   Final    IN/OUT CATH URINE Performed at [facility] [address] [place], [place] [address]    Special Requests   Final    NONE Performed at [facility], [place] [address], [place], [place] [address]    Culture (A)  Final     <10,000 COLONIES/mL INSIGNIFICANT GROWTH Performed at [facility] [address] [place], [place] [address]    Report Status 05/15/2019 FINAL  Final  Blood Culture ID Panel (Reflexed)     Status: Abnormal   Collection Time: 05/14/19  8:52 PM  Result Value Ref Range Status   Enterococcus species NOT DETECTED NOT DETECTED Final   Listeria monocytogenes NOT DETECTED NOT DETECTED Final   Staphylococcus species DETECTED (A) NOT DETECTED Final    Comment: Methicillin (oxacillin) resistant coagulase negative staphylococcus. Possible blood culture contaminant (unless isolated from more than one blood culture draw or clinical case suggests pathogenicity). No antibiotic treatment is indicated for blood  culture contaminants. CRITICAL RESULT CALLED TO, READ BACK BY AND VERIFIED WITH: J LEGGE PHARMD [id number] 05/15/19 A BROWNING    Staphylococcus aureus (BCID) NOT DETECTED NOT DETECTED Final   Methicillin resistance DETECTED (A) NOT DETECTED Final    Comment: CRITICAL RESULT CALLED TO, READ BACK BY AND  VERIFIED WITH: Christean Grief PHARMD [id number] 05/15/19 A BROWNING    Streptococcus species DETECTED (A) NOT DETECTED Final    Comment: Not Enterococcus species, Streptococcus agalactiae, Streptococcus pyogenes, or Streptococcus pneumoniae. CRITICAL RESULT CALLED TO, READ BACK BY AND VERIFIED WITH: Christean Grief PHARMD [id number] 05/15/19 A BROWNING    Streptococcus agalactiae NOT DETECTED NOT DETECTED Final   Streptococcus pneumoniae NOT DETECTED NOT DETECTED Final   Streptococcus pyogenes NOT DETECTED NOT DETECTED Final   Acinetobacter baumannii NOT DETECTED NOT DETECTED Final   Enterobacteriaceae species NOT DETECTED NOT DETECTED Final   Enterobacter cloacae complex NOT DETECTED NOT DETECTED Final   Escherichia coli NOT DETECTED NOT DETECTED Final   Klebsiella oxytoca NOT DETECTED NOT DETECTED Final   Klebsiella pneumoniae NOT DETECTED NOT DETECTED Final   Proteus species NOT DETECTED NOT DETECTED Final   Serratia  marcescens NOT DETECTED NOT DETECTED Final   Haemophilus influenzae NOT DETECTED NOT DETECTED Final   Neisseria meningitidis NOT DETECTED NOT DETECTED Final   Pseudomonas aeruginosa NOT DETECTED NOT DETECTED Final   Candida albicans NOT DETECTED NOT DETECTED Final   Candida glabrata NOT DETECTED NOT DETECTED Final   Candida krusei NOT DETECTED NOT DETECTED Final   Candida parapsilosis NOT DETECTED NOT DETECTED Final   Candida tropicalis NOT DETECTED NOT DETECTED Final    Comment: Performed at [facility], [place] [address] [place], [place] [address]  SARS Coronavirus 2 (CEPHEID- Performed in [place] [facility] hospital lab), Hosp Order     Status: None   Collection Time: 05/14/19  8:55 PM   Specimen: Nasopharyngeal Swab  Result Value Ref Range Status   SARS Coronavirus 2 NEGATIVE NEGATIVE Final    Comment: (NOTE) If result is NEGATIVE SARS-CoV-2 target nucleic acids are NOT DETECTED. The SARS-CoV-2 RNA is generally detectable in upper and lower  respiratory specimens during the acute phase of infection. The lowest  concentration of SARS-CoV-2 viral copies this assay can detect is 250  copies / mL. A negative result does not preclude SARS-CoV-2 infection  and should not be used as the sole basis for treatment or other  patient management decisions.  A negative result may occur with  improper specimen collection / handling, submission of specimen other  than nasopharyngeal swab, presence of viral mutation(s) within the  areas targeted by this assay, and inadequate number of viral copies  (<250 copies / mL). A negative result must be combined with clinical  observations, patient history, and epidemiological information. If result is POSITIVE SARS-CoV-2 target nucleic acids are DETECTED. The SARS-CoV-2 RNA is generally detectable in upper and lower  respiratory specimens dur ing the acute phase of infection.  Positive  results are indicative of active infection with SARS-CoV-2.  Clinical   correlation with patient history and other diagnostic information is  necessary to determine patient infection status.  Positive results do  not rule out bacterial infection or co-infection with other viruses. If result is PRESUMPTIVE POSTIVE SARS-CoV-2 nucleic acids MAY BE PRESENT.   A presumptive positive result was obtained on the submitted specimen  and confirmed on repeat testing.  While 2019 novel coronavirus  (SARS-CoV-2) nucleic acids may be present in the submitted sample  additional confirmatory testing may be necessary for epidemiological  and / or clinical management purposes  to differentiate between  SARS-CoV-2 and other Sarbecovirus currently known to infect humans.  If clinically indicated additional testing with an alternate test  methodology [phone]) is advised. The SARS-CoV-2 RNA is generally  detectable in upper  and lower respiratory sp ecimens during the acute  phase of infection. The expected result is Negative. Fact Sheet for Patients:  StrictlyIdeas.no Fact Sheet for Healthcare Providers: BankingDealers.co.za This test is not yet approved or cleared by the [place] FDA and has been authorized for detection and/or diagnosis of SARS-CoV-2 by FDA under an Emergency Use Authorization (EUA).  This EUA will remain in effect (meaning this test can be used) for the duration of the COVID-19 declaration under Section 564(b)(1) of the Act, 21 U.S.C. section 360bbb-3(b)(1), unless the authorization is terminated or revoked sooner. Performed at [facility], [place] [address] [place], [place] [address]   Blood Culture (routine x 2)     Status: None (Preliminary result)   Collection Time: 05/14/19  8:57 PM   Specimen: BLOOD  Result Value Ref Range Status   Specimen Description   Final    BLOOD BLOOD LEFT ARM Performed at [facility] [address], [place], [place] [address]     Special Requests   Final    BOTTLES DRAWN AEROBIC AND ANAEROBIC Blood Culture adequate volume Performed at [facility] [address] [place], [place] [address]    Culture   Final    NO GROWTH 2 DAYS Performed at [facility] [address], [place], [place] [address]    Report Status PENDING  Incomplete    Janine Ores, MD [facility] for Infectious Disease [facility] [email or web address] 05/16/2019, 1:26 PM

## 2019-05-16 NOTE — Progress Notes (Signed)
[place] for Heparin Indication: atrial fibrillation on PTA Xarelto  Allergies  Allergen Reactions  . Clonidine Derivatives Other (See Comments)    Bradycardia and fatigue   . Sulfa Antibiotics Other (See Comments)    Unknown  . Crestor [Rosuvastatin Calcium] Other (See Comments)    Other reaction(s): tired and weak  . Epinephrine Other (See Comments)    Abnormal feeling. Dental exam/injection of local w/ epi.  Marland Kitchen Hydralazine Other (See Comments)    Nausea/gi upset   . Losartan Other (See Comments)    Hyperkalemia   . Other Other (See Comments)    MANGO'S - WELTS ALL OVER   Patient Measurements: Height: 5' 0.98" (154.9 cm) Weight: 139 lb 1.8 oz (63.1 kg) IBW/kg (Calculated) : 47.76 Heparin Dosing Weight: 60.8  Vital Signs: Temp: 99 F (37.2 C) (07/22 2011) Temp Source: Oral (07/22 2011) BP: 182/52 (07/22 2011) Pulse Rate: 60 (07/22 2011)  Labs: Recent Labs    05/14/19 2052 05/15/19 0341 05/15/19 0500  05/16/19 0020 05/16/19 1027 05/16/19 2008  HGB 9.8*  --  9.7*  --   --  9.0*  --   HCT 30.0*  --  30.2*  --   --  28.4*  --   PLT 152  --  127*  --   --  100*  --   APTT  --  36  --    < > 139* 62* 40*  LABPROT  --  24.0*  --   --   --   --   --   INR  --  2.2*  --   --   --   --   --   HEPARINUNFRC  --  >2.20*  --   --   --  1.12*  --   CREATININE 2.18*  --  2.32*  --   --   --   --    < > = values in this interval not displayed.   Estimated Creatinine Clearance: 16.7 mL/min (A) (by C-G formula based on SCr of 2.32 mg/dL (H)).  Medical History: Past Medical History:  Diagnosis Date  . Anemia   . Anxiety   . Arthritis    "in my hands; knees, back" (09/27/2018)  . CAD (coronary artery disease)    a. 40-59% bilaterally 10/2015.  Marland Kitchen Chronic diastolic CHF (congestive heart failure) ([place])    a. 05/2016 Echo: EF 60-65%, no rwma, Gr1 DD, Ao sclerosis w/o stenosis, triv MR;  b. 07/2016 TEE: EF 55-60%, no rwma, mild MR.  . CKD  (chronic kidney disease), stage III ([place])   . Coronary artery disease    a. 02/2007 Persantine MV: low risk;  b. 11/2011 CABG x 3 (LIMA->LAD, VG->OM, VG->RCA);  c. 05/2016 MV: EF >65%, no isch/infarct, horiz ST dep in I, II, V5-V6.  Marland Kitchen Depression   . Diverticulosis   . Esophageal stricture   . GERD (gastroesophageal reflux disease)   . Hemorrhoids   . Hiatal hernia   . Hyperkalemia    a. ARB stopped due to this.  . Hyperlipidemia   . Hypertension   . Hypertensive heart disease   . Hypothyroidism   . Mild cognitive impairment    a. seen by neurology.  . Myocardial infarction ([place]) 12/07/2011  . PAF (paroxysmal atrial fibrillation) (HCC)    a. post-op CABG.  . Pain    RIGHT KNEE PAIN - TORN RIGHT MEDIAL MENISCUS  . Paroxysmal atrial flutter ([place])    a.  07/2016 s/p TEE & DCCV;  b. 07/2016 Recurrent PAFlutter req initiation of amio & PPM in setting of tachy-brady;  c. CHA2DS2VASc = 7-->Xarelto 15 mg QD.  Marland Kitchen Pneumonia    "twice" (09/27/2018)  . PONV (postoperative nausea and vomiting)   . Presence of permanent cardiac pacemaker 08/12/2016  . S/P CABG (coronary artery bypass graft), 12/04/11 12/07/2011   LIMA to LAD, SVG to OM, SVG to RCA  . Sinus bradycardia    a. not on BB due to this.  . Skin cancer    "face" (09/27/2018)  . Tachy-brady syndrome ([place])    a. 07/2016 Jxnl brady following DCCV, recurrent Aflutter-->amio + SJM 2272 Assurity MRI DC PPM (ser # [id number]).  . Type II diabetes mellitus ([place])    Medications:  Scheduled:  . carvedilol  12.5 mg Oral BID  . clopidogrel  75 mg Oral Daily  . ferrous sulfate  325 mg Oral Q breakfast  . insulin aspart  0-9 Units Subcutaneous TID WC  . insulin glargine  22 Units Subcutaneous Daily  . levothyroxine  88 mcg Oral Q0600  . rosuvastatin  10 mg Oral Daily  . vitamin B-12  100 mcg Oral Daily   Infusions:  . sodium chloride    . heparin    . [START ON 05/18/2019] vancomycin      Assessment: 51 yoF with fever, weakness on PTA xarelto  for A-fib. LD 7/20 at unknown time  Baseline labs:  H/H=9.8/30, plts = 152, aptt=36   , INR= 2.2 , HL= artificially elevated at > 2.2, due to Xarelto Will use aptt to follow as HL is falsely elevated d/t xarelto  7/21: APTT SUPRAtherapeutic on current IV heparin rate of 800 units/hr  Today, 7/22 0020 aPTT = 139 sec despite decreasing the rate earlier. Rate decreased further 700 units > 500 units/hr - RN reported no bleeding or infusion issues.  1027 aPTT 62 sec, Hep level decreased, still elevated, DOAC still affecting level 2008 aptt = 40 sec below goal, no bleeding or infusion issues per RN.   Goal of Therapy:  Heparin level 0.3-0.7 units/ml Heparin level 66-102 units/ml  Monitor platelets by anticoagulation protocol: Yes   Plan:   Increase heparin drip to 700 units/hr  Daily CBC, daily HL/aptt   Dorrene German PharmD 05/16/2019 9:35 PM

## 2019-05-16 NOTE — Progress Notes (Signed)
PROGRESS NOTE  [name] OZD:[id number] DOB: Sep 27, 1941 DOA: 05/14/2019 PCP: Leeroy Cha, MD  HPI/Recap of past 24 hours:  Fever has resolved, she denies pain, no sob, no cough, no diarrhea She reports cat scratch at home  Assessment/Plan: Principal Problem:   SIRS (systemic inflammatory response syndrome) (HCC) Active Problems:   Diabetes mellitus, type 2 (HCC)   Hyperlipidemia LDL goal <70   S/P CABG (coronary artery bypass graft), 12/04/11   Essential hypertension   Anemia of chronic disease   CKD (chronic kidney disease) stage 4, GFR 15-29 ml/min (HCC)   Chronic diastolic CHF (congestive heart failure) (HCC)   CAD S/P percutaneous coronary angioplasty   Fever   Acute encephalopathy  GPC bacteremia With h/o pacemaker  Get echo, start vanc , case discussed with ID  Insulin dependent dm2 a1c 7.1 Continue lantus, ssi  H/o CAD status post CABG and stenting denies any chest pain.  On Plavix carvedilol statins H/o  paroxysmal atrial fibrillation on Xarelto. H/o pacemaker On heparin drip for now, resume xarelto once no procedure planned   On beta-blockers. H/o Chronic diastolic CHF holding torsemide for now. H/o CKDIV, appear at baseline   Memory impairment -possible mild baseline dementia, daughter reports patient is being followed by a neurologist   Code Status: full  Family Communication: patient , daughter over the phone  Disposition Plan: not ready to discharge   Consultants:  ID  Procedures:  none  Antibiotics:  As above   Objective: BP (!) 142/58 (BP Location: Right Arm)   Pulse (!) 59   Temp 99 F (37.2 C)   Resp 17   Ht 5' 0.98" (1.549 m)   Wt 63.1 kg   SpO2 98%   BMI 26.30 kg/m   Intake/Output Summary (Last 24 hours) at 05/16/2019 1001 Last data filed at 05/16/2019 0600 Gross per 24 hour  Intake 283.78 ml  Output 350 ml  Net -66.22 ml   Filed Weights   05/15/19 1259  Weight: 63.1 kg    Exam: Patient is examined  daily including today on 05/16/2019, exams remain the same as of yesterday except that has changed    General:  NAD, very hard of hearing, not oriented to the year but to the months and place  Cardiovascular: RRR  Respiratory: CTABL  Abdomen: Soft/ND/NT, positive BS  Musculoskeletal: No Edema  Neuro: alert, slightly confused about the year  Data Reviewed: Basic Metabolic Panel: Recent Labs  Lab 05/14/19 2052 05/15/19 0500  NA 137 138  K 4.3 4.7  CL 103 105  CO2 22 21*  GLUCOSE 197* 202*  BUN 59* 58*  CREATININE 2.18* 2.32*  CALCIUM 8.7* 8.1*   Liver Function Tests: Recent Labs  Lab 05/14/19 2052 05/15/19 0500  AST 30 32  ALT 17 17  ALKPHOS 69 67  BILITOT 0.8 0.8  PROT 7.6 7.2  ALBUMIN 4.1 3.9   No results for input(s): LIPASE, AMYLASE in the last 168 hours. No results for input(s): AMMONIA in the last 168 hours. CBC: Recent Labs  Lab 05/14/19 2052 05/15/19 0500  WBC 7.7 9.8  NEUTROABS 6.4 8.4*  HGB 9.8* 9.7*  HCT 30.0* 30.2*  MCV 98.7 100.7*  PLT 152 127*   Cardiac Enzymes:   No results for input(s): CKTOTAL, CKMB, CKMBINDEX, TROPONINI in the last 168 hours. BNP (last 3 results) No results for input(s): BNP in the last 8760 hours.  ProBNP (last 3 results) No results for input(s): PROBNP in the last 8760 hours.  CBG: Recent Labs  Lab 05/15/19 0820 05/15/19 1638 05/15/19 2135 05/16/19 0822  GLUCAP 202* 150* 152* 82    Recent Results (from the past 240 hour(s))  Blood Culture (routine x 2)     Status: None (Preliminary result)   Collection Time: 05/14/19  8:52 PM   Specimen: BLOOD RIGHT ARM  Result Value Ref Range Status   Specimen Description   Final    BLOOD RIGHT ARM Performed at [facility], [place] [address], [place], [place] [address]    Special Requests   Final    BOTTLES DRAWN AEROBIC AND ANAEROBIC Blood Culture results may not be optimal due to an inadequate volume of blood received in culture bottles Performed at [facility] [address], [place], [place] [address]    Culture  Setup Time   Final    GRAM POSITIVE COCCI IN BOTH AEROBIC AND ANAEROBIC BOTTLES Organism ID to follow CRITICAL RESULT CALLED TO, READ BACK BY AND VERIFIED WITHChristean Grief PHARMD [id number] 05/15/19 A BROWNING Performed at [facility], [place] [address] [place], [place] [address]    Culture GRAM POSITIVE COCCI  Final   Report Status PENDING  Incomplete  Urine culture     Status: Abnormal   Collection Time: 05/14/19  8:52 PM   Specimen: In/Out Cath Urine  Result Value Ref Range Status   Specimen Description   Final    IN/OUT CATH URINE Performed at [facility], [place] [address], [place], [place] [address]    Special Requests   Final    NONE Performed at [facility], [place] [address], [place], [place] [address]    Culture (A)  Final    <10,000 COLONIES/mL INSIGNIFICANT GROWTH Performed at [facility] [address], [place], [place] [address]    Report Status 05/15/2019 FINAL  Final  Blood Culture ID Panel (Reflexed)     Status: Abnormal   Collection Time: 05/14/19  8:52 PM  Result Value Ref Range Status   Enterococcus species NOT DETECTED NOT DETECTED Final   Listeria monocytogenes NOT DETECTED NOT DETECTED Final   Staphylococcus species DETECTED (A) NOT DETECTED Final    Comment: Methicillin (oxacillin) resistant coagulase negative staphylococcus. Possible blood culture contaminant (unless isolated from more than one blood culture draw or clinical case suggests pathogenicity). No antibiotic treatment is indicated for blood  culture contaminants. CRITICAL RESULT CALLED TO, READ BACK BY AND VERIFIED WITH: Christean Grief PHARMD 1814 05/15/19 A BROWNING    Staphylococcus aureus (BCID) NOT DETECTED NOT DETECTED Final   Methicillin resistance DETECTED (A) NOT DETECTED Final    Comment: CRITICAL RESULT CALLED TO, READ BACK BY AND VERIFIED WITH: Christean Grief PHARMD 1814 05/15/19  A BROWNING    Streptococcus species DETECTED (A) NOT DETECTED Final    Comment: Not Enterococcus species, Streptococcus agalactiae, Streptococcus pyogenes, or Streptococcus pneumoniae. CRITICAL RESULT CALLED TO, READ BACK BY AND VERIFIED WITH: Christean Grief PHARMD 1814 05/15/19 A BROWNING    Streptococcus agalactiae NOT DETECTED NOT DETECTED Final   Streptococcus pneumoniae NOT DETECTED NOT DETECTED Final   Streptococcus pyogenes NOT DETECTED NOT DETECTED Final   Acinetobacter baumannii NOT DETECTED NOT DETECTED Final   Enterobacteriaceae species NOT DETECTED NOT DETECTED Final   Enterobacter cloacae complex NOT DETECTED NOT DETECTED Final   Escherichia coli NOT DETECTED NOT DETECTED Final   Klebsiella oxytoca NOT DETECTED NOT DETECTED Final   Klebsiella pneumoniae NOT DETECTED NOT DETECTED Final  Proteus species NOT DETECTED NOT DETECTED Final   Serratia marcescens NOT DETECTED NOT DETECTED Final   Haemophilus influenzae NOT DETECTED NOT DETECTED Final   Neisseria meningitidis NOT DETECTED NOT DETECTED Final   Pseudomonas aeruginosa NOT DETECTED NOT DETECTED Final   Candida albicans NOT DETECTED NOT DETECTED Final   Candida glabrata NOT DETECTED NOT DETECTED Final   Candida krusei NOT DETECTED NOT DETECTED Final   Candida parapsilosis NOT DETECTED NOT DETECTED Final   Candida tropicalis NOT DETECTED NOT DETECTED Final    Comment: Performed at [facility], [place] [address] [place], [place] [address]  SARS Coronavirus 2 (CEPHEID- Performed in [facility] lab), Hosp Order     Status: None   Collection Time: 05/14/19  8:55 PM   Specimen: Nasopharyngeal Swab  Result Value Ref Range Status   SARS Coronavirus 2 NEGATIVE NEGATIVE Final    Comment: (NOTE) If result is NEGATIVE SARS-CoV-2 target nucleic acids are NOT DETECTED. The SARS-CoV-2 RNA is generally detectable in upper and lower  respiratory specimens during the acute phase of infection. The lowest  concentration of  SARS-CoV-2 viral copies this assay can detect is 250  copies / mL. A negative result does not preclude SARS-CoV-2 infection  and should not be used as the sole basis for treatment or other  patient management decisions.  A negative result may occur with  improper specimen collection / handling, submission of specimen other  than nasopharyngeal swab, presence of viral mutation(s) within the  areas targeted by this assay, and inadequate number of viral copies  (<250 copies / mL). A negative result must be combined with clinical  observations, patient history, and epidemiological information. If result is POSITIVE SARS-CoV-2 target nucleic acids are DETECTED. The SARS-CoV-2 RNA is generally detectable in upper and lower  respiratory specimens dur ing the acute phase of infection.  Positive  results are indicative of active infection with SARS-CoV-2.  Clinical  correlation with patient history and other diagnostic information is  necessary to determine patient infection status.  Positive results do  not rule out bacterial infection or co-infection with other viruses. If result is PRESUMPTIVE POSTIVE SARS-CoV-2 nucleic acids MAY BE PRESENT.   A presumptive positive result was obtained on the submitted specimen  and confirmed on repeat testing.  While 2019 novel coronavirus  (SARS-CoV-2) nucleic acids may be present in the submitted sample  additional confirmatory testing may be necessary for epidemiological  and / or clinical management purposes  to differentiate between  SARS-CoV-2 and other Sarbecovirus currently known to infect humans.  If clinically indicated additional testing with an alternate test  methodology [phone]) is advised. The SARS-CoV-2 RNA is generally  detectable in upper and lower respiratory sp ecimens during the acute  phase of infection. The expected result is Negative. Fact Sheet for Patients:  StrictlyIdeas.no Fact Sheet for Healthcare  Providers: BankingDealers.co.za This test is not yet approved or cleared by the [place] FDA and has been authorized for detection and/or diagnosis of SARS-CoV-2 by FDA under an Emergency Use Authorization (EUA).  This EUA will remain in effect (meaning this test can be used) for the duration of the COVID-19 declaration under Section 564(b)(1) of the Act, 21 U.S.C. section 360bbb-3(b)(1), unless the authorization is terminated or revoked sooner. Performed at [facility], [place] [address] [place], [place] [address]   Blood Culture (routine x 2)     Status: None (Preliminary result)   Collection Time: 05/14/19  8:57 PM  Specimen: BLOOD  Result Value Ref Range Status   Specimen Description   Final    BLOOD BLOOD LEFT ARM Performed at [facility] [address] [place], [place] [address]    Special Requests   Final    BOTTLES DRAWN AEROBIC AND ANAEROBIC Blood Culture adequate volume Performed at [facility] [address], [place], [place] [address]    Culture   Final    NO GROWTH < 12 HOURS Performed at [facility] [address], [place], [place] [address]    Report Status PENDING  Incomplete     Studies: No results found.  Scheduled Meds: . carvedilol  12.5 mg Oral BID  . clopidogrel  75 mg Oral Daily  . ferrous sulfate  325 mg Oral Q breakfast  . insulin aspart  0-9 Units Subcutaneous TID WC  . insulin glargine  22 Units Subcutaneous Daily  . levothyroxine  88 mcg Oral Q0600  . rosuvastatin  10 mg Oral Daily  . vitamin B-12  100 mcg Oral Daily    Continuous Infusions: . cefTRIAXone (ROCEPHIN)  IV Stopped (05/15/19 2211)  . heparin 500 Units/hr (05/16/19 0600)     Time spent: 68mins, case discussed with ID I have personally reviewed and interpreted on  05/16/2019 daily labs, tele strips, imagings as discussed above under date review session and assessment and plans.  I  reviewed all nursing notes, pharmacy notes, consultant notes,  vitals, pertinent old records  I have discussed plan of care as described above with RN , patient and family on 05/16/2019   Florencia Reasons MD, PhD  [facility] Pager [phone]. If 7PM-7AM, please contact night-coverage at [email or web address], password [place] 05/16/2019, 10:01 AM  LOS: 1 day

## 2019-05-16 NOTE — Progress Notes (Signed)
Pharmacy Antibiotic Note  [name] is a 78 y.o. female admitted on 05/14/2019 with fever, weakness, probable UTI.   Pharmacy has been consulted for Vancomycin dosing for possible bacteremia  Plan: CTX 1gm q24 - UTI Vanc 1250mg  x1, then 750mg  q48, AUC 512, SCr 2.32  Today is day 3 Rocephin, UCx with insignificant growth, LOT desired?  Height: 5' 0.98" (154.9 cm) Weight: 139 lb 1.8 oz (63.1 kg) IBW/kg (Calculated) : 47.76  Temp (24hrs), Avg:99.7 F (37.6 C), Min:98.6 F (37 C), Max:101.5 F (38.6 C)  Recent Labs  Lab 05/14/19 2052 05/14/19 2053 05/15/19 0500  WBC 7.7  --  9.8  CREATININE 2.18*  --  2.32*  LATICACIDVEN  --  1.1  --     Estimated Creatinine Clearance: 16.7 mL/min (A) (by C-G formula based on SCr of 2.32 mg/dL (H)).    Allergies  Allergen Reactions  . Clonidine Derivatives Other (See Comments)    Bradycardia and fatigue   . Sulfa Antibiotics Other (See Comments)    Unknown  . Crestor [Rosuvastatin Calcium] Other (See Comments)    Other reaction(s): tired and weak  . Epinephrine Other (See Comments)    Abnormal feeling. Dental exam/injection of local w/ epi.  Marland Kitchen Hydralazine Other (See Comments)    Nausea/gi upset   . Losartan Other (See Comments)    Hyperkalemia   . Other Other (See Comments)    MANGO'S - WELTS ALL OVER   Antimicrobials this admission: 7/20 Rocephin >>  7/22 Vancomycin >>   Dose adjustments this admission:  Microbiology results: 7/20 BCx: BCID 7/21 resulted in 1 of 4 bottles with Strep sp, Staph aureus- meth resistant 7/20 UCx: insignificant growth- final  Thank you for allowing pharmacy to be a part of this patient's care.  Minda Ditto 05/16/2019 10:36 AM

## 2019-05-16 NOTE — Progress Notes (Signed)
[place] for Heparin Indication: atrial fibrillation on PTA Xarelto  Allergies  Allergen Reactions  . Clonidine Derivatives Other (See Comments)    Bradycardia and fatigue   . Sulfa Antibiotics Other (See Comments)    Unknown  . Crestor [Rosuvastatin Calcium] Other (See Comments)    Other reaction(s): tired and weak  . Epinephrine Other (See Comments)    Abnormal feeling. Dental exam/injection of local w/ epi.  Marland Kitchen Hydralazine Other (See Comments)    Nausea/gi upset   . Losartan Other (See Comments)    Hyperkalemia   . Other Other (See Comments)    MANGO'S - WELTS ALL OVER   Patient Measurements: Height: 5' 0.98" (154.9 cm) Weight: 139 lb 1.8 oz (63.1 kg) IBW/kg (Calculated) : 47.76 Heparin Dosing Weight: 60.8  Vital Signs: Temp: 99.5 F (37.5 C) (07/22 0105) Temp Source: Oral (07/22 0105) BP: 129/52 (07/21 2302) Pulse Rate: 63 (07/21 2302)  Labs: Recent Labs    05/14/19 2052 05/15/19 0341 05/15/19 0500 05/15/19 1641 05/16/19 0020  HGB 9.8*  --  9.7*  --   --   HCT 30.0*  --  30.2*  --   --   PLT 152  --  127*  --   --   APTT  --  36  --  130* 139*  LABPROT  --  24.0*  --   --   --   INR  --  2.2*  --   --   --   HEPARINUNFRC  --  >2.20*  --   --   --   CREATININE 2.18*  --  2.32*  --   --    Estimated Creatinine Clearance: 16.7 mL/min (A) (by C-G formula based on SCr of 2.32 mg/dL (H)).  Medical History: Past Medical History:  Diagnosis Date  . Anemia   . Anxiety   . Arthritis    "in my hands; knees, back" (09/27/2018)  . CAD (coronary artery disease)    a. 40-59% bilaterally 10/2015.  Marland Kitchen Chronic diastolic CHF (congestive heart failure) ([place])    a. 05/2016 Echo: EF 60-65%, no rwma, Gr1 DD, Ao sclerosis w/o stenosis, triv MR;  b. 07/2016 TEE: EF 55-60%, no rwma, mild MR.  . CKD (chronic kidney disease), stage III ([place])   . Coronary artery disease    a. 02/2007 Persantine MV: low risk;  b. 11/2011 CABG x 3 (LIMA->LAD,  VG->OM, VG->RCA);  c. 05/2016 MV: EF >65%, no isch/infarct, horiz ST dep in I, II, V5-V6.  Marland Kitchen Depression   . Diverticulosis   . Esophageal stricture   . GERD (gastroesophageal reflux disease)   . Hemorrhoids   . Hiatal hernia   . Hyperkalemia    a. ARB stopped due to this.  . Hyperlipidemia   . Hypertension   . Hypertensive heart disease   . Hypothyroidism   . Mild cognitive impairment    a. seen by neurology.  . Myocardial infarction ([place]) 12/07/2011  . PAF (paroxysmal atrial fibrillation) (HCC)    a. post-op CABG.  . Pain    RIGHT KNEE PAIN - TORN RIGHT MEDIAL MENISCUS  . Paroxysmal atrial flutter ([place])    a. 07/2016 s/p TEE & DCCV;  b. 07/2016 Recurrent PAFlutter req initiation of amio & PPM in setting of tachy-brady;  c. CHA2DS2VASc = 7-->Xarelto 15 mg QD.  Marland Kitchen Pneumonia    "twice" (09/27/2018)  . PONV (postoperative nausea and vomiting)   . Presence of permanent cardiac pacemaker 08/12/2016  .  S/P CABG (coronary artery bypass graft), 12/04/11 12/07/2011   LIMA to LAD, SVG to OM, SVG to RCA  . Sinus bradycardia    a. not on BB due to this.  . Skin cancer    "face" (09/27/2018)  . Tachy-brady syndrome ([place])    a. 07/2016 Jxnl brady following DCCV, recurrent Aflutter-->amio + SJM 2272 Assurity MRI DC PPM (ser # [id number]).  . Type II diabetes mellitus ([place])    Medications:  Scheduled:  . carvedilol  12.5 mg Oral BID  . clopidogrel  75 mg Oral Daily  . ferrous sulfate  325 mg Oral Q breakfast  . insulin aspart  0-9 Units Subcutaneous TID WC  . insulin glargine  22 Units Subcutaneous Daily  . levothyroxine  88 mcg Oral Q0600  . rosuvastatin  10 mg Oral Daily  . vitamin B-12  100 mcg Oral Daily   Infusions:  . cefTRIAXone (ROCEPHIN)  IV 1 g (05/15/19 2141)  . heparin      Assessment: 58 yoF with fever, weakness on PTA xarelto for A-fib. LD 7/20 at unknown time  Baseline labs:  H/H=9.8/30, plts = 152, aptt=36   , INR= 2.2 , HL= artificially elevated at > 2.2, due to  Xarelto Will use aptt to follow as HL is falsely elevated d/t xarelto  7/21  APTT SUPRAtherapeutic on current IV heparin rate of 800 units/hr Today, 7/22  0020 aptt = 139 sec despite decreasing the rate earlier. RN reports no bleeding or infusion issues. She is verifying lab was drawn from opposite arm as heparin.   Goal of Therapy:  Heparin level 0.3-0.7 units/ml Heparin level 66-102 units/ml  Monitor platelets by anticoagulation protocol: Yes   Plan:   Decrease IV heparin rate from 700 units/hr to 500 units/hr  Recheck aPTT and HL in 8 hours  Daily CBC    Dorrene German 05/16/2019 1:11 AM

## 2019-05-16 NOTE — CV Procedure (Addendum)
Echo attempted but due to  not having proper PPE (N95); will wait for 2nd Covid test to result.

## 2019-05-16 NOTE — Progress Notes (Signed)
[place] for Heparin Indication: atrial fibrillation on PTA Xarelto  Allergies  Allergen Reactions  . Clonidine Derivatives Other (See Comments)    Bradycardia and fatigue   . Sulfa Antibiotics Other (See Comments)    Unknown  . Crestor [Rosuvastatin Calcium] Other (See Comments)    Other reaction(s): tired and weak  . Epinephrine Other (See Comments)    Abnormal feeling. Dental exam/injection of local w/ epi.  Marland Kitchen Hydralazine Other (See Comments)    Nausea/gi upset   . Losartan Other (See Comments)    Hyperkalemia   . Other Other (See Comments)    MANGO'S - WELTS ALL OVER   Patient Measurements: Height: 5' 0.98" (154.9 cm) Weight: 139 lb 1.8 oz (63.1 kg) IBW/kg (Calculated) : 47.76 Heparin Dosing Weight: 60.8  Vital Signs: Temp: 99 F (37.2 C) (07/22 0522) Temp Source: Oral (07/22 0105) BP: 142/58 (07/22 0522) Pulse Rate: 59 (07/22 0522)  Labs: Recent Labs    05/14/19 2052 05/15/19 0341 05/15/19 0500 05/15/19 1641 05/16/19 0020  HGB 9.8*  --  9.7*  --   --   HCT 30.0*  --  30.2*  --   --   PLT 152  --  127*  --   --   APTT  --  36  --  130* 139*  LABPROT  --  24.0*  --   --   --   INR  --  2.2*  --   --   --   HEPARINUNFRC  --  >2.20*  --   --   --   CREATININE 2.18*  --  2.32*  --   --    Estimated Creatinine Clearance: 16.7 mL/min (A) (by C-G formula based on SCr of 2.32 mg/dL (H)).  Medical History: Past Medical History:  Diagnosis Date  . Anemia   . Anxiety   . Arthritis    "in my hands; knees, back" (09/27/2018)  . CAD (coronary artery disease)    a. 40-59% bilaterally 10/2015.  Marland Kitchen Chronic diastolic CHF (congestive heart failure) ([place])    a. 05/2016 Echo: EF 60-65%, no rwma, Gr1 DD, Ao sclerosis w/o stenosis, triv MR;  b. 07/2016 TEE: EF 55-60%, no rwma, mild MR.  . CKD (chronic kidney disease), stage III ([place])   . Coronary artery disease    a. 02/2007 Persantine MV: low risk;  b. 11/2011 CABG x 3 (LIMA->LAD,  VG->OM, VG->RCA);  c. 05/2016 MV: EF >65%, no isch/infarct, horiz ST dep in I, II, V5-V6.  Marland Kitchen Depression   . Diverticulosis   . Esophageal stricture   . GERD (gastroesophageal reflux disease)   . Hemorrhoids   . Hiatal hernia   . Hyperkalemia    a. ARB stopped due to this.  . Hyperlipidemia   . Hypertension   . Hypertensive heart disease   . Hypothyroidism   . Mild cognitive impairment    a. seen by neurology.  . Myocardial infarction ([place]) 12/07/2011  . PAF (paroxysmal atrial fibrillation) (H[facility])    a. post-op CABG.  . Pain    RIGHT KNEE PAIN - TORN RIGHT MEDIAL MENISCUS  . Paroxysmal atrial flutter ([place])    a. 07/2016 s/p TEE & DCCV;  b. 07/2016 Recurrent PAFlutter req initiation of amio & PPM in setting of tachy-brady;  c. CHA2DS2VASc = 7-->Xarelto 15 mg QD.  Marland Kitchen Pneumonia    "twice" (09/27/2018)  . PONV (postoperative nausea and vomiting)   . Presence of permanent cardiac pacemaker 08/12/2016  .  S/P CABG (coronary artery bypass graft), 12/04/11 12/07/2011   LIMA to LAD, SVG to OM, SVG to RCA  . Sinus bradycardia    a. not on BB due to this.  . Skin cancer    "face" (09/27/2018)  . Tachy-brady syndrome ([place])    a. 07/2016 Jxnl brady following DCCV, recurrent Aflutter-->amio + SJM 2272 Assurity MRI DC PPM (ser # [id number]).  . Type II diabetes mellitus ([place])    Medications:  Scheduled:  . carvedilol  12.5 mg Oral BID  . clopidogrel  75 mg Oral Daily  . ferrous sulfate  325 mg Oral Q breakfast  . insulin aspart  0-9 Units Subcutaneous TID WC  . insulin glargine  22 Units Subcutaneous Daily  . levothyroxine  88 mcg Oral Q0600  . rosuvastatin  10 mg Oral Daily  . vitamin B-12  100 mcg Oral Daily   Infusions:  . cefTRIAXone (ROCEPHIN)  IV Stopped (05/15/19 2211)  . heparin 500 Units/hr (05/16/19 0600)  . vancomycin    . [START ON 05/18/2019] vancomycin      Assessment: 24 yoF with fever, weakness on PTA xarelto for A-fib. LD 7/20 at unknown time  Baseline labs:  H/H=9.8/30,  plts = 152, aptt=36   , INR= 2.2 , HL= artificially elevated at > 2.2, due to Xarelto Will use aptt to follow as HL is falsely elevated d/t xarelto  7/21: APTT SUPRAtherapeutic on current IV heparin rate of 800 units/hr  Today, 7/22 0020 aPTT = 139 sec despite decreasing the rate earlier. Rate decreased further 700 units > 500 units/hr - RN reported no bleeding or infusion issues.  1027 aPTT 62 sec, Hep level decreased, still elevated, DOAC still affecting level  Goal of Therapy:  Heparin level 0.3-0.7 units/ml Heparin level 66-102 units/ml  Monitor platelets by anticoagulation protocol: Yes   Plan:   Continue Heparin infusion at 500 units/hr  Recheck aPTT at 8p  Daily CBC, daily HL   Minda Ditto PharmD 05/16/2019 10:35 AM

## 2019-05-17 ENCOUNTER — Inpatient Hospital Stay (HOSPITAL_COMMUNITY): Payer: Medicare Other

## 2019-05-17 ENCOUNTER — Telehealth: Payer: Self-pay

## 2019-05-17 DIAGNOSIS — L989 Disorder of the skin and subcutaneous tissue, unspecified: Secondary | ICD-10-CM

## 2019-05-17 DIAGNOSIS — Z950 Presence of cardiac pacemaker: Secondary | ICD-10-CM | POA: Diagnosis present

## 2019-05-17 DIAGNOSIS — I361 Nonrheumatic tricuspid (valve) insufficiency: Secondary | ICD-10-CM

## 2019-05-17 DIAGNOSIS — Z955 Presence of coronary angioplasty implant and graft: Secondary | ICD-10-CM

## 2019-05-17 DIAGNOSIS — I340 Nonrheumatic mitral (valve) insufficiency: Secondary | ICD-10-CM

## 2019-05-17 DIAGNOSIS — E119 Type 2 diabetes mellitus without complications: Secondary | ICD-10-CM

## 2019-05-17 DIAGNOSIS — I071 Rheumatic tricuspid insufficiency: Secondary | ICD-10-CM

## 2019-05-17 LAB — CBC
HCT: 25.9 % — ABNORMAL LOW (ref 36.0–46.0)
Hemoglobin: 8.2 g/dL — ABNORMAL LOW (ref 12.0–15.0)
MCH: 31.4 pg (ref 26.0–34.0)
MCHC: 31.7 g/dL (ref 30.0–36.0)
MCV: 99.2 fL (ref 80.0–100.0)
Platelets: 93 10*3/uL — ABNORMAL LOW (ref 150–400)
RBC: 2.61 MIL/uL — ABNORMAL LOW (ref 3.87–5.11)
RDW: 13 % (ref 11.5–15.5)
WBC: 5.3 10*3/uL (ref 4.0–10.5)
nRBC: 0 % (ref 0.0–0.2)

## 2019-05-17 LAB — BASIC METABOLIC PANEL
Anion gap: 11 (ref 5–15)
BUN: 52 mg/dL — ABNORMAL HIGH (ref 8–23)
CO2: 22 mmol/L (ref 22–32)
Calcium: 8.3 mg/dL — ABNORMAL LOW (ref 8.9–10.3)
Chloride: 109 mmol/L (ref 98–111)
Creatinine, Ser: 1.89 mg/dL — ABNORMAL HIGH (ref 0.44–1.00)
GFR calc Af Amer: 29 mL/min — ABNORMAL LOW (ref >60)
GFR calc non Af Amer: 25 mL/min — ABNORMAL LOW (ref >60)
Glucose, Bld: 89 mg/dL (ref 70–99)
Potassium: 4.1 mmol/L (ref 3.5–5.1)
Sodium: 142 mmol/L (ref 135–145)

## 2019-05-17 LAB — HEPARIN LEVEL (UNFRACTIONATED): Heparin Unfractionated: 0.65 IU/mL (ref 0.30–0.70)

## 2019-05-17 LAB — CULTURE, BLOOD (ROUTINE X 2)

## 2019-05-17 LAB — ECHOCARDIOGRAM COMPLETE
Height: 60.984 in
Weight: 2225.76 oz

## 2019-05-17 LAB — GLUCOSE, CAPILLARY
Glucose-Capillary: 75 mg/dL (ref 70–99)
Glucose-Capillary: 145 mg/dL — ABNORMAL HIGH (ref 70–99)
Glucose-Capillary: 144 mg/dL — ABNORMAL HIGH (ref 70–99)
Glucose-Capillary: 133 mg/dL — ABNORMAL HIGH (ref 70–99)

## 2019-05-17 LAB — APTT: aPTT: 83 seconds — ABNORMAL HIGH (ref 24–36)

## 2019-05-17 MED ORDER — ISOSORBIDE MONONITRATE ER 30 MG PO TB24
30.0000 mg | ORAL_TABLET | Freq: Every day | ORAL | Status: DC
  Administered 2019-05-17 – 2019-05-18 (×2): 30 mg via ORAL
  Filled 2019-05-17 (×2): qty 1

## 2019-05-17 MED ORDER — ALUM & MAG HYDROXIDE-SIMETH 200-200-20 MG/5ML PO SUSP
30.0000 mL | ORAL | Status: DC | PRN
  Administered 2019-05-17 – 2019-05-21 (×3): 30 mL via ORAL
  Filled 2019-05-17 (×4): qty 30

## 2019-05-17 MED ORDER — LORAZEPAM 2 MG/ML IJ SOLN
0.5000 mg | Freq: Once | INTRAMUSCULAR | Status: AC
  Administered 2019-05-17: 0.5 mg via INTRAVENOUS
  Filled 2019-05-17: qty 1

## 2019-05-17 MED ORDER — MUSCLE RUB 10-15 % EX CREA
TOPICAL_CREAM | CUTANEOUS | Status: DC | PRN
  Administered 2019-05-17: 14:00:00 via TOPICAL
  Filled 2019-05-17: qty 85

## 2019-05-17 NOTE — Progress Notes (Signed)
Pt care taken over from previous RN this shift at 1600. I have reviewed the previus RN's assessment and I agree with their findings. The pt is resting with pain 6/10 in their groin. Tylenol given will continue to monitor. Pt is in no other acute distress. Will follow pt condition.

## 2019-05-17 NOTE — Progress Notes (Signed)
PROGRESS NOTE  [name] UDJ:[id number] DOB: 04-26-1941 DOA: 05/14/2019 PCP: Leeroy Cha, MD  HPI/Recap of past 24 hours:  Fever has resolved, tmax 99.4,  she denies pain, no sob, no cough, no diarrhea, she is anxious, she wants to go home She is weak, she reports lives by herself,   Assessment/Plan: Principal Problem:   SIRS (systemic inflammatory response syndrome) ([place]) Active Problems:   Insulin dependent diabetes mellitus ([place])   Hyperlipidemia LDL goal <70   S/P CABG (coronary artery bypass graft), 12/04/11   Essential hypertension   Anemia of chronic disease   CKD (chronic kidney disease) stage 4, GFR 15-29 ml/min (HCC)   Chronic diastolic CHF (congestive heart failure) (H[facility])   CAD S/P percutaneous coronary angioplasty   Fever   Acute encephalopathy   Bacteremia due to Gram-positive bacteria  GPC bacteremia with fever With h/o pacemaker  Continue on iv vanc , case discussed with ID who recommend proceed with TEE Fever resolved on abx, first screening covid test is negative, cxr no acute findings, though fever is from bacteremia, she has not respiratory symptom, can discontinue airborne precaution after discussion with ID  Insulin dependent dm2 a1c 7.1 Continue lantus, ssi  H/o CAD status post CABG and stenting denies any chest pain.  On Plavix carvedilol statins H/o  paroxysmal atrial fibrillation on Xarelto. H/o pacemaker On heparin drip for now, resume xarelto after TEE.   On beta-blockers. H/o Chronic diastolic CHF holding torsemide for now. H/o CKDIV, appear at baseline   Memory impairment -possible mild baseline dementia, daughter reports patient is being followed by a neurologist  FTT: PT eval, home health   Code Status: full  Family Communication: patient , daughter over the phone  Disposition Plan: not ready to discharge, needs TEE   Consultants:  ID  Cardiology for TEE  Procedures:  none  Antibiotics:  As  above   Objective: BP (!) 164/56 (BP Location: Left Arm)   Pulse (!) 59   Temp 98.4 F (36.9 C) (Oral)   Resp 16   Ht 5' 0.98" (1.549 m)   Wt 63.1 kg   SpO2 97%   BMI 26.30 kg/m   Intake/Output Summary (Last 24 hours) at 05/17/2019 1400 Last data filed at 05/16/2019 2014 Gross per 24 hour  Intake 213 ml  Output 250 ml  Net -37 ml   Filed Weights   05/15/19 1259  Weight: 63.1 kg    Exam: Patient is examined daily including today on 05/17/2019, exams remain the same as of yesterday except that has changed    General:  NAD, very hard of hearing, not oriented to the year but to the months and place  Cardiovascular: RRR  Respiratory: CTABL  Abdomen: Soft/ND/NT, positive BS  Musculoskeletal: No Edema  Neuro: alert, slightly confused about the year  Data Reviewed: Basic Metabolic Panel: Recent Labs  Lab 05/14/19 2052 05/15/19 0500 05/17/19 0914  NA 137 138 142  K 4.3 4.7 4.1  CL 103 105 109  CO2 22 21* 22  GLUCOSE 197* 202* 89  BUN 59* 58* 52*  CREATININE 2.18* 2.32* 1.89*  CALCIUM 8.7* 8.1* 8.3*   Liver Function Tests: Recent Labs  Lab 05/14/19 2052 05/15/19 0500  AST 30 32  ALT 17 17  ALKPHOS 69 67  BILITOT 0.8 0.8  PROT 7.6 7.2  ALBUMIN 4.1 3.9   No results for input(s): LIPASE, AMYLASE in the last 168 hours. No results for input(s): AMMONIA in the last 168 hours. CBC:  Recent Labs  Lab 05/14/19 2052 05/15/19 0500 05/16/19 1027 05/17/19 0914  WBC 7.7 9.8 5.7 5.3  NEUTROABS 6.4 8.4*  --   --   HGB 9.8* 9.7* 9.0* 8.2*  HCT 30.0* 30.2* 28.4* 25.9*  MCV 98.7 100.7* 101.8* 99.2  PLT 152 127* 100* 93*   Cardiac Enzymes:   No results for input(s): CKTOTAL, CKMB, CKMBINDEX, TROPONINI in the last 168 hours. BNP (last 3 results) No results for input(s): BNP in the last 8760 hours.  ProBNP (last 3 results) No results for input(s): PROBNP in the last 8760 hours.  CBG: Recent Labs  Lab 05/16/19 1215 05/16/19 1655 05/16/19 2239  05/17/19 0911 05/17/19 1302  GLUCAP 136* 138* 149* 75 145*    Recent Results (from the past 240 hour(s))  Blood Culture (routine x 2)     Status: Abnormal   Collection Time: 05/14/19  8:52 PM   Specimen: BLOOD RIGHT ARM  Result Value Ref Range Status   Specimen Description   Final    BLOOD RIGHT ARM Performed at [facility], [place] [address], [place], [place] [address]    Special Requests   Final    BOTTLES DRAWN AEROBIC AND ANAEROBIC Blood Culture results may not be optimal due to an inadequate volume of blood received in culture bottles Performed at [facility] [address] [place], [place] [address]    Culture  Setup Time   Final    GRAM POSITIVE COCCI IN BOTH AEROBIC AND ANAEROBIC BOTTLES Organism ID to follow CRITICAL RESULT CALLED TO, READ BACK BY AND VERIFIED WITH: J LEGGE PHARMD [id number] 05/15/19 A BROWNING    Culture (A)  Final    STAPHYLOCOCCUS SPECIES (COAGULASE NEGATIVE) THE SIGNIFICANCE OF ISOLATING THIS ORGANISM FROM A SINGLE SET OF BLOOD CULTURES WHEN MULTIPLE SETS ARE DRAWN IS UNCERTAIN. PLEASE NOTIFY THE MICROBIOLOGY DEPARTMENT WITHIN ONE WEEK IF SPECIATION AND SENSITIVITIES ARE REQUIRED. Performed at [facility], [place] [address], [place], [place] [address]    Report Status 05/17/2019 FINAL  Final  Urine culture     Status: Abnormal   Collection Time: 05/14/19  8:52 PM   Specimen: In/Out Cath Urine  Result Value Ref Range Status   Specimen Description   Final    IN/OUT CATH URINE Performed at [facility] [address] [place], [place] [address]    Special Requests   Final    NONE Performed at [facility], [place] [address], [place], [place] [address]    Culture (A)  Final    <10,000 COLONIES/mL INSIGNIFICANT GROWTH Performed at [facility] [address], [place], [place] [address]    Report Status 05/15/2019 FINAL  Final  Blood Culture ID Panel (Reflexed)     Status:  Abnormal   Collection Time: 05/14/19  8:52 PM  Result Value Ref Range Status   Enterococcus species NOT DETECTED NOT DETECTED Final   Listeria monocytogenes NOT DETECTED NOT DETECTED Final   Staphylococcus species DETECTED (A) NOT DETECTED Final    Comment: Methicillin (oxacillin) resistant coagulase negative staphylococcus. Possible blood culture contaminant (unless isolated from more than one blood culture draw or clinical case suggests pathogenicity). No antibiotic treatment is indicated for blood  culture contaminants. CRITICAL RESULT CALLED TO, READ BACK BY AND VERIFIED WITH: Christean Grief PHARMD [id number] 05/15/19 A BROWNING    Staphylococcus aureus (BCID) NOT DETECTED NOT DETECTED Final   Methicillin resistance DETECTED (A) NOT DETECTED Final  Comment: CRITICAL RESULT CALLED TO, READ BACK BY AND VERIFIED WITH: Christean Grief PHARMD [id number] 05/15/19 A BROWNING    Streptococcus species DETECTED (A) NOT DETECTED Final    Comment: Not Enterococcus species, Streptococcus agalactiae, Streptococcus pyogenes, or Streptococcus pneumoniae. CRITICAL RESULT CALLED TO, READ BACK BY AND VERIFIED WITH: Christean Grief PHARMD 1814 05/15/19 A BROWNING    Streptococcus agalactiae NOT DETECTED NOT DETECTED Final   Streptococcus pneumoniae NOT DETECTED NOT DETECTED Final   Streptococcus pyogenes NOT DETECTED NOT DETECTED Final   Acinetobacter baumannii NOT DETECTED NOT DETECTED Final   Enterobacteriaceae species NOT DETECTED NOT DETECTED Final   Enterobacter cloacae complex NOT DETECTED NOT DETECTED Final   Escherichia coli NOT DETECTED NOT DETECTED Final   Klebsiella oxytoca NOT DETECTED NOT DETECTED Final   Klebsiella pneumoniae NOT DETECTED NOT DETECTED Final   Proteus species NOT DETECTED NOT DETECTED Final   Serratia marcescens NOT DETECTED NOT DETECTED Final   Haemophilus influenzae NOT DETECTED NOT DETECTED Final   Neisseria meningitidis NOT DETECTED NOT DETECTED Final   Pseudomonas aeruginosa NOT DETECTED NOT  DETECTED Final   Candida albicans NOT DETECTED NOT DETECTED Final   Candida glabrata NOT DETECTED NOT DETECTED Final   Candida krusei NOT DETECTED NOT DETECTED Final   Candida parapsilosis NOT DETECTED NOT DETECTED Final   Candida tropicalis NOT DETECTED NOT DETECTED Final    Comment: Performed at [facility], [place] [address] [place], [place] [address]  SARS Coronavirus 2 (CEPHEID- Performed in [facility] lab), Hosp Order     Status: None   Collection Time: 05/14/19  8:55 PM   Specimen: Nasopharyngeal Swab  Result Value Ref Range Status   SARS Coronavirus 2 NEGATIVE NEGATIVE Final    Comment: (NOTE) If result is NEGATIVE SARS-CoV-2 target nucleic acids are NOT DETECTED. The SARS-CoV-2 RNA is generally detectable in upper and lower  respiratory specimens during the acute phase of infection. The lowest  concentration of SARS-CoV-2 viral copies this assay can detect is 250  copies / mL. A negative result does not preclude SARS-CoV-2 infection  and should not be used as the sole basis for treatment or other  patient management decisions.  A negative result may occur with  improper specimen collection / handling, submission of specimen other  than nasopharyngeal swab, presence of viral mutation(s) within the  areas targeted by this assay, and inadequate number of viral copies  (<250 copies / mL). A negative result must be combined with clinical  observations, patient history, and epidemiological information. If result is POSITIVE SARS-CoV-2 target nucleic acids are DETECTED. The SARS-CoV-2 RNA is generally detectable in upper and lower  respiratory specimens dur ing the acute phase of infection.  Positive  results are indicative of active infection with SARS-CoV-2.  Clinical  correlation with patient history and other diagnostic information is  necessary to determine patient infection status.  Positive results do  not rule out bacterial infection or co-infection with  other viruses. If result is PRESUMPTIVE POSTIVE SARS-CoV-2 nucleic acids MAY BE PRESENT.   A presumptive positive result was obtained on the submitted specimen  and confirmed on repeat testing.  While 2019 novel coronavirus  (SARS-CoV-2) nucleic acids may be present in the submitted sample  additional confirmatory testing may be necessary for epidemiological  and / or clinical management purposes  to differentiate between  SARS-CoV-2 and other Sarbecovirus currently known to infect humans.  If clinically indicated additional testing with an alternate test  methodology [phone]) is advised.  The SARS-CoV-2 RNA is generally  detectable in upper and lower respiratory sp ecimens during the acute  phase of infection. The expected result is Negative. Fact Sheet for Patients:  StrictlyIdeas.no Fact Sheet for Healthcare Providers: BankingDealers.co.za This test is not yet approved or cleared by the [place] FDA and has been authorized for detection and/or diagnosis of SARS-CoV-2 by FDA under an Emergency Use Authorization (EUA).  This EUA will remain in effect (meaning this test can be used) for the duration of the COVID-19 declaration under Section 564(b)(1) of the Act, 21 U.S.C. section 360bbb-3(b)(1), unless the authorization is terminated or revoked sooner. Performed at [facility], [place] [address] [place], [place] [address]   Blood Culture (routine x 2)     Status: None (Preliminary result)   Collection Time: 05/14/19  8:57 PM   Specimen: BLOOD LEFT ARM  Result Value Ref Range Status   Specimen Description   Final    BLOOD LEFT ARM Performed at [facility], [place] [address] [place], [place] [address]    Special Requests   Final    BOTTLES DRAWN AEROBIC AND ANAEROBIC Blood Culture adequate volume Performed at [facility] [address] [place], [place] [address]    Culture   Final     NO GROWTH 3 DAYS Performed at [facility], [place] [address] [place], [place] [address]    Report Status PENDING  Incomplete  Culture, blood (routine x 2)     Status: None (Preliminary result)   Collection Time: 05/16/19  8:08 PM   Specimen: BLOOD RIGHT HAND  Result Value Ref Range Status   Specimen Description   Final    BLOOD RIGHT HAND Performed at [facility] [address] [place], [place] [address]    Special Requests   Final    BOTTLES DRAWN AEROBIC ONLY Blood Culture adequate volume Performed at [facility] [address] [place], [place] [address]    Culture   Final    NO GROWTH < 12 HOURS Performed at [facility] [address] [place], [place] [address]    Report Status PENDING  Incomplete  Culture, blood (routine x 2)     Status: None (Preliminary result)   Collection Time: 05/16/19  8:08 PM   Specimen: BLOOD  Result Value Ref Range Status   Specimen Description   Final    BLOOD RIGHT ANTECUBITAL Performed at [facility] [address] [place], [place] [address]    Special Requests   Final    BOTTLES DRAWN AEROBIC ONLY Blood Culture adequate volume Performed at [facility] [address] [place], [place] [address]    Culture   Final    NO GROWTH < 12 HOURS Performed at [facility] [address], [place],  [address]    Report Status PENDING  Incomplete     Studies: No results found.  Scheduled Meds: . carvedilol  12.5 mg Oral BID  . clopidogrel  75 mg Oral Daily  . ferrous sulfate  325 mg Oral Q breakfast  . insulin aspart  0-9 Units Subcutaneous TID WC  . insulin glargine  22 Units Subcutaneous Daily  . isosorbide mononitrate  30 mg Oral Daily  . levothyroxine  88 mcg Oral Q0600  . rosuvastatin  10 mg Oral Daily  . vitamin B-12  100 mcg Oral Daily    Continuous Infusions: . sodium  chloride    . heparin 700 Units/hr (05/16/19 2243)   . [START ON 05/18/2019] vancomycin       Time spent: 7mins, case discussed with ID I have personally reviewed and interpreted on  05/17/2019 daily labs, tele strips, imagings as discussed above under date review session and assessment and plans.  I reviewed all nursing notes, pharmacy notes, consultant notes,  vitals, pertinent old records  I have discussed plan of care as described above with RN , patient and family on 05/17/2019   Florencia Reasons MD, PhD  [facility] Pager [phone]. If 7PM-7AM, please contact night-coverage at [email or web address], password [facility] 05/17/2019, 2:00 PM  LOS: 2 days

## 2019-05-17 NOTE — Progress Notes (Signed)
[facility] for Infectious Disease   Reason for visit: Follow up on MR-CoNS sepsis  Antibiotics: Vancomycin, day 2  Interval History: TTE performed this AM (awaiting cardiologist read). Pt is extremely histrionic today and fixated on going home "right now!" Her hearing remains extremely poor, Fever curve, labs, blood cxs, ABX usage, and imaging independently reviewed    Current Facility-Administered Medications:  .  0.9 %  sodium chloride infusion, , Intravenous, PRN, Florencia Reasons, MD .  acetaminophen (TYLENOL) tablet 650 mg, 650 mg, Oral, Q6H PRN, 650 mg at 05/16/19 2052 **OR** acetaminophen (TYLENOL) suppository 650 mg, 650 mg, Rectal, Q6H PRN, Rise [name], MD .  carvedilol (COREG) tablet 12.5 mg, 12.5 mg, Oral, BID, Rise [name], MD, 12.5 mg at 05/17/19 0867 .  clopidogrel (PLAVIX) tablet 75 mg, 75 mg, Oral, Daily, Rise [name], MD, 75 mg at 05/17/19 1052 .  docusate sodium (COLACE) capsule 100 mg, 100 mg, Oral, BID PRN, Rise [name], MD .  ferrous sulfate tablet 325 mg, 325 mg, Oral, Q breakfast, Rise [name], MD, 325 mg at 05/17/19 1051 .  heparin ADULT infusion 100 units/mL (25000 units/21mL sodium chloride 0.45%), 700 Units/hr, Intravenous, Continuous, Dorrene German, [facility], Last Rate: 7 mL/hr at 05/16/19 2243, 700 Units/hr at 05/16/19 2243 .  insulin aspart (novoLOG) injection 0-9 Units, 0-9 Units, Subcutaneous, TID WC, Rise [name], MD, 1 Units at 05/16/19 1706 .  insulin glargine (LANTUS) injection 22 Units, 22 Units, Subcutaneous, Daily, Rise [name], MD, 22 Units at 05/17/19 1052 .  isosorbide mononitrate (IMDUR) 24 hr tablet 30 mg, 30 mg, Oral, Daily, Florencia Reasons, MD, 30 mg at 05/17/19 1051 .  levothyroxine (SYNTHROID) tablet 88 mcg, 88 mcg, Oral, Q0600, Rise [name], MD, 88 mcg at 05/17/19 0615 .  nitroGLYCERIN (NITROSTAT) SL tablet 0.4 mg, 0.4 mg, Sublingual, Q5 min PRN, Rise [name], MD .   ondansetron [facility]) tablet 4 mg, 4 mg, Oral, Q6H PRN **OR** ondansetron (ZOFRAN) injection 4 mg, 4 mg, Intravenous, Q6H PRN, Rise [name], MD .  rosuvastatin (CRESTOR) tablet 10 mg, 10 mg, Oral, Daily, Rise [name], MD, 10 mg at 05/17/19 1051 .  [START ON 05/18/2019] vancomycin (VANCOCIN) IVPB 750 mg/150 ml premix, 750 mg, Intravenous, Q48H, Green, Terri L, RPH .  vitamin B-12 (CYANOCOBALAMIN) tablet 100 mcg, 100 mcg, Oral, Daily, Rise [name], MD, 100 mcg at 05/17/19 1051   Physical Exam:   Vitals:   05/16/19 2200 05/17/19 0537  BP: (!) 157/62 (!) 177/59  Pulse:  60  Resp:  20  Temp: 98.7 F (37.1 C) 99.4 F (37.4 C)  SpO2:  98%   Physical Exam Gen: histrionic/emotionally labile, A&Ox 3, extremely hard of hearing Head: NCAT, no temporal wasting evident EENT: PERRL, EOMI, MMM, adequate dentition Neck: supple, mild JVD CV: NRRR, no murmurs evident, PM palpable w/o tenderness or erythema overlying device Pulm: CTA bilaterally, no wheeze or retractions Abd: soft, NTND, +BS Extrems:  trace LE edema, 2+ pulses Skin: multiple linear scratches/healing lesions to both LEs, most notable along her lateral LT ankle, adequate skin turgor Neuro: CN II-XII grossly intact except extremely poor hearing as noted above, no focal neurologic deficits appreciated, gait was not assessed, A&Ox 3   Review of Systems:  Review of Systems  Constitutional: Positive for fever and malaise/fatigue. Negative for chills and weight loss.  HENT: Positive for hearing loss. Negative for congestion, sinus pain and sore throat.   Eyes: Negative for blurred vision,  photophobia and discharge.  Respiratory: Negative for cough, hemoptysis and shortness of breath.   Cardiovascular: Negative for chest pain, palpitations, orthopnea and leg swelling.  Gastrointestinal: Negative for abdominal pain, constipation, diarrhea, heartburn, nausea and vomiting.  Genitourinary: Negative for dysuria, flank pain,  frequency and urgency.  Musculoskeletal: Negative for back pain, joint pain and myalgias.  Skin: Negative for itching and rash.  Neurological: Positive for weakness. Negative for tremors, seizures and headaches.  Endo/Heme/Allergies: Negative for polydipsia. Does not bruise/bleed easily.  Psychiatric/Behavioral: Negative for depression and substance abuse. The patient is not nervous/anxious and does not have insomnia.      Lab Results  Component Value Date   WBC 5.3 05/17/2019   HGB 8.2 (L) 05/17/2019   HCT 25.9 (L) 05/17/2019   MCV 99.2 05/17/2019   PLT 93 (L) 05/17/2019    Lab Results  Component Value Date   CREATININE 1.89 (H) 05/17/2019   BUN 52 (H) 05/17/2019   NA 142 05/17/2019   K 4.1 05/17/2019   CL 109 05/17/2019   CO2 22 05/17/2019    Lab Results  Component Value Date   ALT 17 05/15/2019   AST 32 05/15/2019   ALKPHOS 67 05/15/2019     Microbiology: Recent Results (from the past 240 hour(s))  Blood Culture (routine x 2)     Status: Abnormal (Preliminary result)   Collection Time: 05/14/19  8:52 PM   Specimen: BLOOD RIGHT ARM  Result Value Ref Range Status   Specimen Description   Final    BLOOD RIGHT ARM Performed at [facility], [address] [place], [place] [address]    Special Requests   Final    BOTTLES DRAWN AEROBIC AND ANAEROBIC Blood Culture results may not be optimal due to an inadequate volume of blood received in culture bottles Performed at [facility] [address], [place], [place] [address]    Culture  Setup Time   Final    GRAM POSITIVE COCCI IN BOTH AEROBIC AND ANAEROBIC BOTTLES Organism ID to follow CRITICAL RESULT CALLED TO, READ BACK BY AND VERIFIED WITHChristean Grief PHARMD [id number] 05/15/19 A BROWNING Performed at [facility], [place] [address] [place], [place] [address]    Culture STAPHYLOCOCCUS SPECIES (COAGULASE NEGATIVE) (A)  Final   Report Status PENDING  Incomplete  Urine culture     Status: Abnormal    Collection Time: 05/14/19  8:52 PM   Specimen: In/Out Cath Urine  Result Value Ref Range Status   Specimen Description   Final    IN/OUT CATH URINE Performed at [facility] [address], [place], [place] [address]    Special Requests   Final    NONE Performed at [facility], [place] [address] [place], [place] [address]    Culture (A)  Final    <10,000 COLONIES/mL INSIGNIFICANT GROWTH Performed at [facility] [address], [place], [place] [address]    Report Status 05/15/2019 FINAL  Final  Blood Culture ID Panel (Reflexed)     Status: Abnormal   Collection Time: 05/14/19  8:52 PM  Result Value Ref Range Status   Enterococcus species NOT DETECTED NOT DETECTED Final   Listeria monocytogenes NOT DETECTED NOT DETECTED Final   Staphylococcus species DETECTED (A) NOT DETECTED Final    Comment: Methicillin (oxacillin) resistant coagulase negative staphylococcus. Possible blood culture contaminant (unless isolated from more than one blood culture draw or clinical case suggests pathogenicity). No antibiotic  treatment is indicated for blood  culture contaminants. CRITICAL RESULT CALLED TO, READ BACK BY AND VERIFIED WITH: Christean Grief PHARMD [id number] 05/15/19 A BROWNING    Staphylococcus aureus (BCID) NOT DETECTED NOT DETECTED Final   Methicillin resistance DETECTED (A) NOT DETECTED Final    Comment: CRITICAL RESULT CALLED TO, READ BACK BY AND VERIFIED WITH: Christean Grief PHARMD 1814 05/15/19 A BROWNING    Streptococcus species DETECTED (A) NOT DETECTED Final    Comment: Not Enterococcus species, Streptococcus agalactiae, Streptococcus pyogenes, or Streptococcus pneumoniae. CRITICAL RESULT CALLED TO, READ BACK BY AND VERIFIED WITH: Christean Grief PHARMD 1814 05/15/19 A BROWNING    Streptococcus agalactiae NOT DETECTED NOT DETECTED Final   Streptococcus pneumoniae NOT DETECTED NOT DETECTED Final   Streptococcus pyogenes NOT DETECTED NOT DETECTED Final    Acinetobacter baumannii NOT DETECTED NOT DETECTED Final   Enterobacteriaceae species NOT DETECTED NOT DETECTED Final   Enterobacter cloacae complex NOT DETECTED NOT DETECTED Final   Escherichia coli NOT DETECTED NOT DETECTED Final   Klebsiella oxytoca NOT DETECTED NOT DETECTED Final   Klebsiella pneumoniae NOT DETECTED NOT DETECTED Final   Proteus species NOT DETECTED NOT DETECTED Final   Serratia marcescens NOT DETECTED NOT DETECTED Final   Haemophilus influenzae NOT DETECTED NOT DETECTED Final   Neisseria meningitidis NOT DETECTED NOT DETECTED Final   Pseudomonas aeruginosa NOT DETECTED NOT DETECTED Final   Candida albicans NOT DETECTED NOT DETECTED Final   Candida glabrata NOT DETECTED NOT DETECTED Final   Candida krusei NOT DETECTED NOT DETECTED Final   Candida parapsilosis NOT DETECTED NOT DETECTED Final   Candida tropicalis NOT DETECTED NOT DETECTED Final    Comment: Performed at [facility], [place] [address], [place], [place] [address]  SARS Coronavirus 2 (CEPHEID- Performed in [facility] lab), Hosp Order     Status: None   Collection Time: 05/14/19  8:55 PM   Specimen: Nasopharyngeal Swab  Result Value Ref Range Status   SARS Coronavirus 2 NEGATIVE NEGATIVE Final    Comment: (NOTE) If result is NEGATIVE SARS-CoV-2 target nucleic acids are NOT DETECTED. The SARS-CoV-2 RNA is generally detectable in upper and lower  respiratory specimens during the acute phase of infection. The lowest  concentration of SARS-CoV-2 viral copies this assay can detect is 250  copies / mL. A negative result does not preclude SARS-CoV-2 infection  and should not be used as the sole basis for treatment or other  patient management decisions.  A negative result may occur with  improper specimen collection / handling, submission of specimen other  than nasopharyngeal swab, presence of viral mutation(s) within the  areas targeted by this assay, and inadequate number of viral copies  (<250  copies / mL). A negative result must be combined with clinical  observations, patient history, and epidemiological information. If result is POSITIVE SARS-CoV-2 target nucleic acids are DETECTED. The SARS-CoV-2 RNA is generally detectable in upper and lower  respiratory specimens dur ing the acute phase of infection.  Positive  results are indicative of active infection with SARS-CoV-2.  Clinical  correlation with patient history and other diagnostic information is  necessary to determine patient infection status.  Positive results do  not rule out bacterial infection or co-infection with other viruses. If result is PRESUMPTIVE POSTIVE SARS-CoV-2 nucleic acids MAY BE PRESENT.   A presumptive positive result was obtained on the submitted specimen  and confirmed on repeat testing.  While 2019 novel coronavirus  (SARS-CoV-2) nucleic acids may be present in  the submitted sample  additional confirmatory testing may be necessary for epidemiological  and / or clinical management purposes  to differentiate between  SARS-CoV-2 and other Sarbecovirus currently known to infect humans.  If clinically indicated additional testing with an alternate test  methodology [phone]) is advised. The SARS-CoV-2 RNA is generally  detectable in upper and lower respiratory sp ecimens during the acute  phase of infection. The expected result is Negative. Fact Sheet for Patients:  StrictlyIdeas.no Fact Sheet for Healthcare Providers: BankingDealers.co.za This test is not yet approved or cleared by the [place] FDA and has been authorized for detection and/or diagnosis of SARS-CoV-2 by FDA under an Emergency Use Authorization (EUA).  This EUA will remain in effect (meaning this test can be used) for the duration of the COVID-19 declaration under Section 564(b)(1) of the Act, 21 U.S.C. section 360bbb-3(b)(1), unless the authorization is terminated or revoked  sooner. Performed at [facility], [place] [address], [place], [place] [address]   Blood Culture (routine x 2)     Status: None (Preliminary result)   Collection Time: 05/14/19  8:57 PM   Specimen: BLOOD LEFT ARM  Result Value Ref Range Status   Specimen Description   Final    BLOOD LEFT ARM Performed at [facility], [place] [address], [place], [place] [address]    Special Requests   Final    BOTTLES DRAWN AEROBIC AND ANAEROBIC Blood Culture adequate volume Performed at [facility] [address] [place], [place] [address]    Culture   Final    NO GROWTH 3 DAYS Performed at [facility], [place] [address] [place], [place] [address]    Report Status PENDING  Incomplete  Culture, blood (routine x 2)     Status: None (Preliminary result)   Collection Time: 05/16/19  8:08 PM   Specimen: BLOOD RIGHT HAND  Result Value Ref Range Status   Specimen Description   Final    BLOOD RIGHT HAND Performed at [facility] [address], [place], [place] [address]    Special Requests   Final    BOTTLES DRAWN AEROBIC ONLY Blood Culture adequate volume Performed at [facility] [address] [place], [place] [address]    Culture   Final    NO GROWTH < 12 HOURS Performed at [facility] [address], [place], [place] [address]    Report Status PENDING  Incomplete  Culture, blood (routine x 2)     Status: None (Preliminary result)   Collection Time: 05/16/19  8:08 PM   Specimen: BLOOD  Result Value Ref Range Status   Specimen Description   Final    BLOOD RIGHT ANTECUBITAL Performed at [facility] [address] [place], [place] [address]    Special Requests   Final    BOTTLES DRAWN AEROBIC ONLY Blood Culture adequate volume Performed at [facility] [address], [place], [place] [address]    Culture   Final    NO GROWTH < 12 HOURS Performed at  [facility] [address], [place], [place] [address]    Report Status PENDING  Incomplete    Impression/Plan: The patient is a 78 y/o WF diabetic with CAD, s/p CABG and PTCI, tachy-brady syndrome, s/p PM, and stage IV CRI admitted with fever and confusion and probable CoNS sepsis.  1. CoNS sepsis - continue vanc dosed for goal trough of  15-20. D/c'ed rocephin as MR-CoNS confirmed. TTE showed no clear evidence IE or infection to PM but did comment on eccentric jet to MV and mild-moderate TR, so would proceed with TEE to further evaluate. Repeat blood cxs x 2 (showing NGTD thus far) to assess for clearance of her BSI.  Suspect her CoNS sepsis did arise from her recent cat scratches. I briefly educated pt on this risk and counselled her on methods to lower the risk of acquiring injury from the cat's claws, including wearing longer pants, providing adequate toys for the cat to play with, and using bitter apple spray to her skin to train cat not to bite or scratch her skin. > 30 minutes of direct face-to-face time spent attempting to educate and calm the patient but her lack of hearing aides is a severe impediment. I also spent 20 minutes discussing her care with her hospitalist and daughter on the phone today as well. Daughter plans to go out of town this weekend, leaving concern for safe d/c planning.  2. CRI - Pt has known stage IV CRI per records reviewed. Cr fluctuates from 1.8-2.5 over the past year, so uncertain of true baseline Cr. Unfortunately, her infection necessitates vancomycin for now. Appreciate pharmacy assistance with dosing. Check BMP daily to follow Cr trend to assist with appropriate ABX dosing.  3. Thrombocytopenia - Uncertain if this is the patient's baseline or if this is more due to her current sepsis. If she recovers, she may be a candidate for completion of ABX tx with oral zyvox, provided her thrombocytopenia resolves and her TTE shows no evidence of endocarditis on her  valves or PM wires, either of which would indicate the need for prolonged parenteral ABX.

## 2019-05-17 NOTE — Telephone Encounter (Signed)
I rounded on her today. She has dementia and is extremely hard of hearing. You can disregard messages if she calls again. Her daughter was updated via phone as well.

## 2019-05-17 NOTE — Progress Notes (Addendum)
[facility] for Heparin Indication: atrial fibrillation on PTA Xarelto  Allergies  Allergen Reactions  . Clonidine Derivatives Other (See Comments)    Bradycardia and fatigue   . Sulfa Antibiotics Other (See Comments)    Unknown  . Crestor [Rosuvastatin Calcium] Other (See Comments)    Other reaction(s): tired and weak  . Epinephrine Other (See Comments)    Abnormal feeling. Dental exam/injection of local w/ epi.  Marland Kitchen Hydralazine Other (See Comments)    Nausea/gi upset   . Losartan Other (See Comments)    Hyperkalemia   . Other Other (See Comments)    MANGO'S - WELTS ALL OVER   Patient Measurements: Height: 5' 0.98" (154.9 cm) Weight: 139 lb 1.8 oz (63.1 kg) IBW/kg (Calculated) : 47.76 Heparin Dosing Weight: 60.8  Vital Signs: Temp: 99.4 F (37.4 C) (07/23 0537) Temp Source: Oral (07/23 0537) BP: 177/59 (07/23 0537) Pulse Rate: 60 (07/23 0537)  Labs: Recent Labs    05/14/19 2052 05/15/19 0341 05/15/19 0500  05/16/19 1027 05/16/19 2008 05/17/19 0914  HGB 9.8*  --  9.7*  --  9.0*  --  8.2*  HCT 30.0*  --  30.2*  --  28.4*  --  25.9*  PLT 152  --  127*  --  100*  --  93*  APTT  --  36  --    < > 62* 40* 83*  LABPROT  --  24.0*  --   --   --   --   --   INR  --  2.2*  --   --   --   --   --   HEPARINUNFRC  --  >2.20*  --   --  1.12*  --   --   CREATININE 2.18*  --  2.32*  --   --   --  1.89*   < > = values in this interval not displayed.   Estimated Creatinine Clearance: 20.5 mL/min (A) (by C-G formula based on SCr of 1.89 mg/dL (H)).  Medications:  Scheduled:  . carvedilol  12.5 mg Oral BID  . clopidogrel  75 mg Oral Daily  . ferrous sulfate  325 mg Oral Q breakfast  . insulin aspart  0-9 Units Subcutaneous TID WC  . insulin glargine  22 Units Subcutaneous Daily  . isosorbide mononitrate  30 mg Oral Daily  . levothyroxine  88 mcg Oral Q0600  . rosuvastatin  10 mg Oral Daily  . vitamin B-12  100 mcg Oral Daily    Infusions:  . sodium chloride    . heparin 700 Units/hr (05/16/19 2243)  . [START ON 05/18/2019] vancomycin      Assessment: 62 yoF with fever, weakness on PTA xarelto for A-fib. LD 7/20 at unknown time  Baseline labs:  H/H=9.8/30, plts = 152, aptt=36   , INR= 2.2 , HL= artificially elevated at > 2.2, due to Xarelto Will use aptt to follow as HL is falsely elevated d/t xarelto  7/21: APTT SUPRAtherapeutic on current IV heparin rate of 800 units/hr 7/22: Hep rate reduced for elevated aPTT, then increased again with low aPTT  Today, 05/17/2019 0914 aPTT 83 sec, Hep level 0.65 on 700 units/hr Hgb 8.2, sl decr, Plt 93, decreased   Goal of Therapy:  Heparin level 0.3-0.7 units/ml Heparin level 66-102 units/ml  Monitor platelets by anticoagulation protocol: Yes   Plan:   Continue Heparin at 700 units/hr  Daily CBC, daily HL  Awaiting TTE, if no need  for TEE, plan transition back to Xarelto 15mg  daily with supper  Minda Ditto PharmD 05/17/2019 9:53 AM

## 2019-05-17 NOTE — Telephone Encounter (Signed)
Patient called today stating she is currently admitted at [facility], and would like to talk Dr. Prince Rome. Patient states she has additional questions for him.  Patient's room#: [place], [place]

## 2019-05-17 NOTE — Progress Notes (Signed)
I spoke with Infectious Disease Dr. Prince Rome he felt like she could be removed from the airborne/contact precautions because they have a diagnosis of bacteremia to explain her fevers.He told me to check with Infection Prevention for the protocol about stopping the airborne/contact precautions. I called and spoke with Anderson Malta C with Infection prevention and she said if he is not concerned about Covid then the precautions could be discontinued.

## 2019-05-17 NOTE — Evaluation (Signed)
Physical Therapy Evaluation Patient Details Name: [name] MRN: [id number] DOB: [birth year] Today's Date: 05/17/2019   History of Present Illness  78 y.o.white female diabetic with CAD, tachy-brady syndrome, s/p PM, and stage IV CRI who was admitted on 05/15/2019 with AMS and fever  Clinical Impression  Pt admitted with above diagnosis. Pt currently with functional limitations due to the deficits listed below (see PT Problem List).  Pt will benefit from skilled PT to increase their independence and safety with mobility to allow discharge to the venue listed below.  Pt reports some right hip pain from pressing down on foot board most of last night however pain improved with mobilizing.  Pt requesting tylenol and RN notified.  Pt declines SNF at this time and plans to d/c home.  Pt attempting to set up family assist upon d/c since she lives alone.  Recommend HHPT and initial supervision for mobility upon d/c.     Follow Up Recommendations Home health PT;Supervision for mobility/OOB    Equipment Recommendations  None recommended by PT    Recommendations for Other Services       Precautions / Restrictions Precautions Precautions: Fall      Mobility  Bed Mobility Overal bed mobility: Needs Assistance Bed Mobility: Supine to Sit     Supine to sit: Min assist     General bed mobility comments: assist for R LE due to pain  Transfers Overall transfer level: Needs assistance Equipment used: Rolling walker (2 wheeled) Transfers: Sit to/from Stand Sit to Stand: Min guard         General transfer comment: min/guard for safety  Ambulation/Gait Ambulation/Gait assistance: Min guard Gait Distance (Feet): 18 Feet Assistive device: Rolling walker (2 wheeled) Gait Pattern/deviations: Step-through pattern;Decreased stride length Gait velocity: decr   General Gait Details: slow but steady gait with RW, pt reports R hip pain improved with mobilizing (limited to room due to  airborne precautions)  Stairs            Wheelchair Mobility    Modified Rankin (Stroke Patients Only)       Balance Overall balance assessment: Needs assistance         Standing balance support: Bilateral upper extremity supported Standing balance-Leahy Scale: Poor Standing balance comment: utilizes UE suport                             Pertinent Vitals/Pain Pain Assessment: 0-10 Pain Score: 6  Pain Location: R leg (states mostly in hip from pushing up against bed foot board all night) Pain Descriptors / Indicators: Sore Pain Intervention(s): Repositioned;Monitored during session;Patient requesting pain meds-RN notified    Home Living Family/patient expects to be discharged to:: Private residence Living Arrangements: Alone Available Help at Discharge: Family;Available PRN/intermittently;Friend(s) Type of Home: House Home Access: Stairs to enter Entrance Stairs-Rails: None Entrance Stairs-Number of Steps: 2 Home Layout: One level Home Equipment: Walker - 4 wheels;Walker - 2 wheels      Prior Function Level of Independence: Independent with assistive device(s)               Hand Dominance        Extremity/Trunk Assessment        Lower Extremity Assessment Lower Extremity Assessment: Generalized weakness       Communication   Communication: HOH  Cognition Arousal/Alertness: Awake/alert Behavior During Therapy: WFL for tasks assessed/performed Overall Cognitive Status: Within Functional Limits for tasks assessed  General Comments      Exercises     Assessment/Plan    PT Assessment Patient needs continued PT services  PT Problem List Decreased strength;Decreased mobility;Decreased activity tolerance;Decreased balance;Decreased knowledge of use of DME       PT Treatment Interventions DME instruction;Gait training;Balance training;Therapeutic exercise;Functional  mobility training;Therapeutic activities;Patient/family education    PT Goals (Current goals can be found in the Care Plan section)  Acute Rehab PT Goals PT Goal Formulation: With patient Time For Goal Achievement: 05/31/19 Potential to Achieve Goals: Good    Frequency Min 3X/week   Barriers to discharge        Co-evaluation               AM-PAC PT "6 Clicks" Mobility  Outcome Measure Help needed turning from your back to your side while in a flat bed without using bedrails?: A Little Help needed moving from lying on your back to sitting on the side of a flat bed without using bedrails?: A Little Help needed moving to and from a bed to a chair (including a wheelchair)?: A Little Help needed standing up from a chair using your arms (e.g., wheelchair or bedside chair)?: A Little Help needed to walk in hospital room?: A Little Help needed climbing 3-5 steps with a railing? : A Little 6 Click Score: 18    End of Session Equipment Utilized During Treatment: Gait belt Activity Tolerance: Patient tolerated treatment well Patient left: in chair;with chair alarm set;with call bell/phone within reach Nurse Communication: Mobility status;Patient requests pain meds PT Visit Diagnosis: Other abnormalities of gait and mobility (R26.89)    Time: 0932-3557 PT Time Calculation (min) (ACUTE ONLY): 26 min   Charges:   PT Evaluation $PT Eval Low Complexity: [place], PT, DPT Acute Rehabilitation Services Office: [phone] Pager: [phone]   Trena Platt 05/17/2019, 3:36 PM

## 2019-05-18 ENCOUNTER — Inpatient Hospital Stay (HOSPITAL_COMMUNITY): Payer: Medicare Other | Admitting: Anesthesiology

## 2019-05-18 ENCOUNTER — Encounter (HOSPITAL_COMMUNITY)
Admission: EM | Disposition: A | Discharge status: Home Health | Payer: Self-pay | Source: Home / Self Care | Attending: Internal Medicine

## 2019-05-18 ENCOUNTER — Inpatient Hospital Stay (HOSPITAL_COMMUNITY): Payer: Medicare Other

## 2019-05-18 ENCOUNTER — Encounter (HOSPITAL_COMMUNITY): Payer: Self-pay | Admitting: Anesthesiology

## 2019-05-18 DIAGNOSIS — I361 Nonrheumatic tricuspid (valve) insufficiency: Secondary | ICD-10-CM

## 2019-05-18 DIAGNOSIS — R6510 Systemic inflammatory response syndrome (SIRS) of non-infectious origin without acute organ dysfunction: Secondary | ICD-10-CM

## 2019-05-18 DIAGNOSIS — R7881 Bacteremia: Secondary | ICD-10-CM

## 2019-05-18 DIAGNOSIS — D638 Anemia in other chronic diseases classified elsewhere: Secondary | ICD-10-CM

## 2019-05-18 HISTORY — PX: TEE WITHOUT CARDIOVERSION: SHX5443

## 2019-05-18 LAB — GLUCOSE, CAPILLARY
Glucose-Capillary: 119 mg/dL — ABNORMAL HIGH (ref 70–99)
Glucose-Capillary: 112 mg/dL — ABNORMAL HIGH (ref 70–99)
Glucose-Capillary: 115 mg/dL — ABNORMAL HIGH (ref 70–99)
Glucose-Capillary: 179 mg/dL — ABNORMAL HIGH (ref 70–99)
Glucose-Capillary: 138 mg/dL — ABNORMAL HIGH (ref 70–99)

## 2019-05-18 LAB — NOVEL CORONAVIRUS, NAA (HOSP ORDER, SEND-OUT TO REF LAB; TAT 18-24 HRS): SARS-CoV-2, NAA: NOT DETECTED

## 2019-05-18 LAB — BASIC METABOLIC PANEL
Anion gap: 9 (ref 5–15)
BUN: 46 mg/dL — ABNORMAL HIGH (ref 8–23)
CO2: 20 mmol/L — ABNORMAL LOW (ref 22–32)
Calcium: 8.1 mg/dL — ABNORMAL LOW (ref 8.9–10.3)
Chloride: 108 mmol/L (ref 98–111)
Creatinine, Ser: 1.73 mg/dL — ABNORMAL HIGH (ref 0.44–1.00)
GFR calc Af Amer: 32 mL/min — ABNORMAL LOW (ref >60)
GFR calc non Af Amer: 28 mL/min — ABNORMAL LOW (ref >60)
Glucose, Bld: 139 mg/dL — ABNORMAL HIGH (ref 70–99)
Potassium: 4.1 mmol/L (ref 3.5–5.1)
Sodium: 137 mmol/L (ref 135–145)

## 2019-05-18 LAB — MAGNESIUM: Magnesium: 2.5 mg/dL — ABNORMAL HIGH (ref 1.7–2.4)

## 2019-05-18 LAB — APTT: aPTT: 91 seconds — ABNORMAL HIGH (ref 24–36)

## 2019-05-18 LAB — CBC
HCT: 22.8 % — ABNORMAL LOW (ref 36.0–46.0)
Hemoglobin: 7.4 g/dL — ABNORMAL LOW (ref 12.0–15.0)
MCH: 32 pg (ref 26.0–34.0)
MCHC: 32.5 g/dL (ref 30.0–36.0)
MCV: 98.7 fL (ref 80.0–100.0)
Platelets: 107 10*3/uL — ABNORMAL LOW (ref 150–400)
RBC: 2.31 MIL/uL — ABNORMAL LOW (ref 3.87–5.11)
RDW: 13.2 % (ref 11.5–15.5)
WBC: 6.2 10*3/uL (ref 4.0–10.5)
nRBC: 0 % (ref 0.0–0.2)

## 2019-05-18 LAB — HEPARIN LEVEL (UNFRACTIONATED): Heparin Unfractionated: 0.42 IU/mL (ref 0.30–0.70)

## 2019-05-18 SURGERY — ECHOCARDIOGRAM, TRANSESOPHAGEAL
Anesthesia: Monitor Anesthesia Care

## 2019-05-18 MED ORDER — SODIUM CHLORIDE 0.9% FLUSH: 10.0000 mL | INTRAVENOUS | Status: DC | PRN

## 2019-05-18 MED ORDER — SODIUM CHLORIDE 0.9 % IV SOLN: INTRAVENOUS | Status: DC

## 2019-05-18 MED ORDER — PROPOFOL 10 MG/ML IV BOLUS
INTRAVENOUS | Status: DC | PRN
  Administered 2019-05-18: 30 mg via INTRAVENOUS
  Administered 2019-05-18: 20 mg via INTRAVENOUS

## 2019-05-18 MED ORDER — PROPOFOL 500 MG/50ML IV EMUL
INTRAVENOUS | Status: DC | PRN
  Administered 2019-05-18: 75 ug/kg/min via INTRAVENOUS

## 2019-05-18 MED ORDER — SODIUM CHLORIDE 0.9 % IV SOLN
INTRAVENOUS | Status: DC | PRN
  Administered 2019-05-18: 13:00:00 via INTRAVENOUS

## 2019-05-18 MED ORDER — SODIUM CHLORIDE 0.9% FLUSH
10.0000 mL | Freq: Two times a day (BID) | INTRAVENOUS | Status: DC
  Administered 2019-05-18 – 2019-05-21 (×5): 10 mL

## 2019-05-18 MED ORDER — INSULIN GLARGINE 100 UNIT/ML ~~LOC~~ SOLN
11.0000 [IU] | Freq: Once | SUBCUTANEOUS | Status: AC
  Administered 2019-05-18: 11 [IU] via SUBCUTANEOUS
  Filled 2019-05-18: qty 0.11

## 2019-05-18 NOTE — Anesthesia Preprocedure Evaluation (Addendum)
Anesthesia Evaluation  Patient identified by MRN, date of birth, ID band Patient awake    Reviewed: Allergy & Precautions, NPO status , Patient's Chart, lab work & pertinent test results, reviewed documented beta blocker date and time   History of Anesthesia Complications (+) PONV  Airway Mallampati: II  TM Distance: >3 FB Neck ROM: Full    Dental  (+) Edentulous Upper, Missing, Chipped,    Pulmonary neg pulmonary ROS,    Pulmonary exam normal        Cardiovascular hypertension, Pt. on medications and Pt. on home beta blockers + CAD, + Past MI, + CABG (2013) and +CHF  Normal cardiovascular exam+ dysrhythmias (on Xarelto) Atrial Fibrillation + pacemaker   TTE 05/17/19: EF 55-60%, mildly reduced RV systolic function, mildly elevated RVSP (48mHg), moderate LAE, MR jet centrally-directed, mild-mod TR     Neuro/Psych Anxiety Depression Dementia negative neurological ROS     GI/Hepatic Neg liver ROS, hiatal hernia, GERD  Controlled and Medicated,  Endo/Other  diabetes, Type 2, Insulin DependentHypothyroidism   Renal/GU Renal InsufficiencyRenal disease (Cr 1.73)     Musculoskeletal  (+) Arthritis ,   Abdominal   Peds  Hematology  (+) anemia , Hgb 7.4, plts 107   Anesthesia Other Findings Day of surgery medications reviewed with the patient.  Bacteremia  Reproductive/Obstetrics                           Anesthesia Physical Anesthesia Plan  ASA: III  Anesthesia Plan: MAC   Post-op Pain Management:    Induction:   PONV Risk Score and Plan: 3 and Treatment may vary due to age or medical condition and Propofol infusion  Airway Management Planned: Natural Airway and Nasal Cannula  Additional Equipment:   Intra-op Plan:   Post-operative Plan:   Informed Consent: I have reviewed the patients History and Physical, chart, labs and discussed the procedure including the risks, benefits and  alternatives for the proposed anesthesia with the patient or authorized representative who has indicated his/her understanding and acceptance.     Dental advisory given  Plan Discussed with: CRNA  Anesthesia Plan Comments:        Anesthesia Quick Evaluation

## 2019-05-18 NOTE — Progress Notes (Addendum)
   Primary Cardiologist: [name], MD  [facility] has been requested to perform a transesophageal echocardiogram on 07/24 for bacteremia.  After careful review of history and examination, the risks and benefits of transesophageal echocardiogram have been explained to patient's daughter, including risks of esophageal damage, perforation (1:10,000 risk), bleeding, pharyngeal hematoma as well as other potential complications associated with conscious sedation and/or general anesthesia including aspiration, arrhythmia, respiratory failure and death. Alternatives to treatment were discussed, questions were answered. Patient also spoken to and is willing to proceed.  Spoke w/ pt daughter by phone, she requests Dr Debara Pickett contact her with results of the procedure and she has given permission for the procedure to be done.  [place], PA-C 05/18/2019 8:26 AM

## 2019-05-18 NOTE — Anesthesia Postprocedure Evaluation (Signed)
Anesthesia Post Note  Patient: [name]  Procedure(s) Performed: TRANSESOPHAGEAL ECHOCARDIOGRAM (TEE) (N/A )     Patient location during evaluation: PACU Anesthesia Type: MAC Level of consciousness: awake and alert Pain management: pain level controlled Vital Signs Assessment: post-procedure vital signs reviewed and stable Respiratory status: spontaneous breathing, nonlabored ventilation and respiratory function stable Cardiovascular status: blood pressure returned to baseline and stable Postop Assessment: no apparent nausea or vomiting Anesthetic complications: no    Last Vitals:  Vitals:   05/18/19 1232 05/18/19 1325  BP: (!) 190/67 (!) 153/54  Pulse: 60 60  Resp: 17 15  Temp: 36.8 C (!) 36.4 C  SpO2: 99% 99%    Last Pain:  Vitals:   05/18/19 1325  TempSrc: Temporal  PainSc: 0-No pain                 Brennan Bailey

## 2019-05-18 NOTE — Progress Notes (Signed)
[facility] for Infectious Disease   Reason for visit: Follow up on MR-CoNS sepsis  Antibiotics: Vancomycin, day 3  Interval History: TEE performed this AM and showed no signs of endocarditis. Pt's daughter at bedside today. Pt happier now that daughter brought her [place] for lunch. No ambulation thus far, using bedpan for toileting. Pt exremely hard of hearing (daughter opted not to bring hearing aides in fearing they'd be lost). Fever curve, labs, blood cxs, ABX usage, and imaging independently reviewed    Current Facility-Administered Medications:  .  0.9 %  sodium chloride infusion, , Intravenous, PRN, Hilty, Nadean Corwin, MD .  acetaminophen (TYLENOL) tablet 650 mg, 650 mg, Oral, Q6H PRN, 650 mg at 05/17/19 1523 **OR** acetaminophen (TYLENOL) suppository 650 mg, 650 mg, Rectal, Q6H PRN, Hilty, Nadean Corwin, MD .  alum & mag hydroxide-simeth (MAALOX/MYLANTA) 200-200-20 MG/5ML suspension 30 mL, 30 mL, Oral, Q4H PRN, [name], MD, 30 mL at 05/17/19 2129 .  carvedilol (COREG) tablet 12.5 mg, 12.5 mg, Oral, BID, Hilty, Nadean Corwin, MD, 12.5 mg at 05/18/19 0952 .  clopidogrel (PLAVIX) tablet 75 mg, 75 mg, Oral, Daily, Hilty, Nadean Corwin, MD, 75 mg at 05/18/19 0946 .  docusate sodium (COLACE) capsule 100 mg, 100 mg, Oral, BID PRN, Hilty, Nadean Corwin, MD .  ferrous sulfate tablet 325 mg, 325 mg, Oral, Q breakfast, Hilty, Nadean Corwin, MD, 325 mg at 05/18/19 0946 .  heparin ADULT infusion 100 units/mL (25000 units/260mL sodium chloride 0.45%), 700 Units/hr, Intravenous, Continuous, Hilty, Nadean Corwin, MD, Last Rate: 7 mL/hr at 05/18/19 0954, 700 Units/hr at 05/18/19 0954 .  insulin aspart (novoLOG) injection 0-9 Units, 0-9 Units, Subcutaneous, TID WC, Hilty, Nadean Corwin, MD, 1 Units at 05/17/19 1754 .  insulin glargine (LANTUS) injection 22 Units, 22 Units, Subcutaneous, Daily, Hilty, Nadean Corwin, MD, 22 Units at 05/17/19 1052 .  isosorbide mononitrate (IMDUR) 24 hr tablet 30 mg, 30 mg, Oral,  Daily, Hilty, Nadean Corwin, MD, 30 mg at 05/18/19 0952 .  levothyroxine (SYNTHROID) tablet 88 mcg, 88 mcg, Oral, Q0600, [name], MD, 88 mcg at 05/17/19 0615 .  Muscle Rub CREA, , Topical, PRN, Hilty, Nadean Corwin, MD .  nitroGLYCERIN (NITROSTAT) SL tablet 0.4 mg, 0.4 mg, Sublingual, Q5 min PRN, Hilty, Nadean Corwin, MD .  ondansetron (ZOFRAN) tablet 4 mg, 4 mg, Oral, Q6H PRN **OR** ondansetron (ZOFRAN) injection 4 mg, 4 mg, Intravenous, Q6H PRN, [name], MD, 4 mg at 05/17/19 1930 .  rosuvastatin (CRESTOR) tablet 10 mg, 10 mg, Oral, Daily, Hilty, Nadean Corwin, MD, 10 mg at 05/18/19 0946 .  vancomycin (VANCOCIN) IVPB 750 mg/150 ml premix, 750 mg, Intravenous, Q48H, Hilty, Nadean Corwin, MD, Last Rate: 150 mL/hr at 05/18/19 1545, 750 mg at 05/18/19 1545 .  vitamin B-12 (CYANOCOBALAMIN) tablet 100 mcg, 100 mcg, Oral, Daily, Hilty, Nadean Corwin, MD, 100 mcg at 05/18/19 0946   Physical Exam:   Vitals:   05/18/19 1355 05/18/19 1438  BP: (!) 147/102 (!) 159/60  Pulse: 62 66  Resp: 16 18  Temp:  97.8 F (36.6 C)  SpO2: 98% 100%   Physical Exam Gen: NAD, A&Ox 3, extremely hard of hearing Head: NCAT, no temporal wasting evident EENT: PERRL, EOMI, MMM, adequate dentition Neck: supple, mild JVD CV: NRRR, no murmurs evident, PM palpable w/o tenderness or erythema overlying device Pulm: CTA bilaterally, no wheeze or retractions Abd: soft, NTND, +BS Extrems:  trace LE edema, 2+ pulses Skin: multiple linear scratches/healing lesions to both LEs,  most notable along her lateral LT ankle, adequate skin turgor Neuro: CN II-XII grossly intact except extremely poor hearing as noted above, no focal neurologic deficits appreciated, gait was not assessed, A&Ox 3   Review of Systems:  Review of Systems  Constitutional: Positive for fever and malaise/fatigue. Negative for chills and weight loss.  HENT: Positive for hearing loss. Negative for congestion, sinus pain and sore throat.   Eyes: Negative for blurred  vision, photophobia and discharge.  Respiratory: Negative for cough, hemoptysis and shortness of breath.   Cardiovascular: Negative for chest pain, palpitations, orthopnea and leg swelling.  Gastrointestinal: Negative for abdominal pain, constipation, diarrhea, heartburn, nausea and vomiting.  Genitourinary: Negative for dysuria, flank pain, frequency and urgency.  Musculoskeletal: Negative for back pain, joint pain and myalgias.  Skin: Negative for itching and rash.  Neurological: Positive for weakness. Negative for tremors, seizures and headaches.  Endo/Heme/Allergies: Negative for polydipsia. Does not bruise/bleed easily.  Psychiatric/Behavioral: Negative for depression and substance abuse. The patient is not nervous/anxious and does not have insomnia.      Lab Results  Component Value Date   WBC 6.2 05/18/2019   HGB 7.4 (L) 05/18/2019   HCT 22.8 (L) 05/18/2019   MCV 98.7 05/18/2019   PLT 107 (L) 05/18/2019    Lab Results  Component Value Date   CREATININE 1.73 (H) 05/18/2019   BUN 46 (H) 05/18/2019   NA 137 05/18/2019   K 4.1 05/18/2019   CL 108 05/18/2019   CO2 20 (L) 05/18/2019    Lab Results  Component Value Date   ALT 17 05/15/2019   AST 32 05/15/2019   ALKPHOS 67 05/15/2019     Microbiology: Recent Results (from the past 240 hour(s))  Blood Culture (routine x 2)     Status: Abnormal   Collection Time: 05/14/19  8:52 PM   Specimen: BLOOD RIGHT ARM  Result Value Ref Range Status   Specimen Description   Final    BLOOD RIGHT ARM Performed at [facility], [address], [place], [place] [address]    Special Requests   Final    BOTTLES DRAWN AEROBIC AND ANAEROBIC Blood Culture results may not be optimal due to an inadequate volume of blood received in culture bottles Performed at [facility] [address], [place], [place] [address]    Culture  Setup Time   Final    GRAM POSITIVE COCCI IN BOTH AEROBIC AND ANAEROBIC  BOTTLES Organism ID to follow CRITICAL RESULT CALLED TO, READ BACK BY AND VERIFIED WITH: J LEGGE PHARMD [id number] 05/15/19 A BROWNING    Culture (A)  Final    STAPHYLOCOCCUS SPECIES (COAGULASE NEGATIVE) THE SIGNIFICANCE OF ISOLATING THIS ORGANISM FROM A SINGLE SET OF BLOOD CULTURES WHEN MULTIPLE SETS ARE DRAWN IS UNCERTAIN. PLEASE NOTIFY THE MICROBIOLOGY DEPARTMENT WITHIN ONE WEEK IF SPECIATION AND SENSITIVITIES ARE REQUIRED. Performed at [facility], [place] [address], [place], [place] [address]    Report Status 05/17/2019 FINAL  Final  Urine culture     Status: Abnormal   Collection Time: 05/14/19  8:52 PM   Specimen: In/Out Cath Urine  Result Value Ref Range Status   Specimen Description   Final    IN/OUT CATH URINE Performed at [facility] [address], [place], [place] [address]    Special Requests   Final    NONE Performed at [facility], [place] [address], [place],  [address]    Culture (A)  Final    <10,000 COLONIES/mL INSIGNIFICANT GROWTH Performed at [facility] [address], [place], [place] [address]    Report Status 05/15/2019 FINAL  Final  Blood Culture ID Panel (Reflexed)     Status: Abnormal   Collection Time: 05/14/19  8:52 PM  Result Value Ref Range Status   Enterococcus species NOT DETECTED NOT DETECTED Final   Listeria monocytogenes NOT DETECTED NOT DETECTED Final   Staphylococcus species DETECTED (A) NOT DETECTED Final    Comment: Methicillin (oxacillin) resistant coagulase negative staphylococcus. Possible blood culture contaminant (unless isolated from more than one blood culture draw or clinical case suggests pathogenicity). No antibiotic treatment is indicated for blood  culture contaminants. CRITICAL RESULT CALLED TO, READ BACK BY AND VERIFIED WITH: Christean Grief PHARMD 1814 05/15/19 A BROWNING    Staphylococcus aureus (BCID) NOT DETECTED NOT DETECTED Final   Methicillin resistance DETECTED (A) NOT  DETECTED Final    Comment: CRITICAL RESULT CALLED TO, READ BACK BY AND VERIFIED WITH: Christean Grief PHARMD 1814 05/15/19 A BROWNING    Streptococcus species DETECTED (A) NOT DETECTED Final    Comment: Not Enterococcus species, Streptococcus agalactiae, Streptococcus pyogenes, or Streptococcus pneumoniae. CRITICAL RESULT CALLED TO, READ BACK BY AND VERIFIED WITH: Christean Grief PHARMD 1814 05/15/19 A BROWNING    Streptococcus agalactiae NOT DETECTED NOT DETECTED Final   Streptococcus pneumoniae NOT DETECTED NOT DETECTED Final   Streptococcus pyogenes NOT DETECTED NOT DETECTED Final   Acinetobacter baumannii NOT DETECTED NOT DETECTED Final   Enterobacteriaceae species NOT DETECTED NOT DETECTED Final   Enterobacter cloacae complex NOT DETECTED NOT DETECTED Final   Escherichia coli NOT DETECTED NOT DETECTED Final   Klebsiella oxytoca NOT DETECTED NOT DETECTED Final   Klebsiella pneumoniae NOT DETECTED NOT DETECTED Final   Proteus species NOT DETECTED NOT DETECTED Final   Serratia marcescens NOT DETECTED NOT DETECTED Final   Haemophilus influenzae NOT DETECTED NOT DETECTED Final   Neisseria meningitidis NOT DETECTED NOT DETECTED Final   Pseudomonas aeruginosa NOT DETECTED NOT DETECTED Final   Candida albicans NOT DETECTED NOT DETECTED Final   Candida glabrata NOT DETECTED NOT DETECTED Final   Candida krusei NOT DETECTED NOT DETECTED Final   Candida parapsilosis NOT DETECTED NOT DETECTED Final   Candida tropicalis NOT DETECTED NOT DETECTED Final    Comment: Performed at [facility], [place] [address] [place], [place] [address]  SARS Coronavirus 2 (CEPHEID- Performed in [facility] lab), Hosp Order     Status: None   Collection Time: 05/14/19  8:55 PM   Specimen: Nasopharyngeal Swab  Result Value Ref Range Status   SARS Coronavirus 2 NEGATIVE NEGATIVE Final    Comment: (NOTE) If result is NEGATIVE SARS-CoV-2 target nucleic acids are NOT DETECTED. The SARS-CoV-2 RNA is generally  detectable in upper and lower  respiratory specimens during the acute phase of infection. The lowest  concentration of SARS-CoV-2 viral copies this assay can detect is 250  copies / mL. A negative result does not preclude SARS-CoV-2 infection  and should not be used as the sole basis for treatment or other  patient management decisions.  A negative result may occur with  improper specimen collection / handling, submission of specimen other  than nasopharyngeal swab, presence of viral mutation(s) within the  areas targeted by this assay, and inadequate number of viral copies  (<250 copies / mL). A negative result must be combined with clinical  observations, patient history, and epidemiological information. If result is POSITIVE  SARS-CoV-2 target nucleic acids are DETECTED. The SARS-CoV-2 RNA is generally detectable in upper and lower  respiratory specimens dur ing the acute phase of infection.  Positive  results are indicative of active infection with SARS-CoV-2.  Clinical  correlation with patient history and other diagnostic information is  necessary to determine patient infection status.  Positive results do  not rule out bacterial infection or co-infection with other viruses. If result is PRESUMPTIVE POSTIVE SARS-CoV-2 nucleic acids MAY BE PRESENT.   A presumptive positive result was obtained on the submitted specimen  and confirmed on repeat testing.  While 2019 novel coronavirus  (SARS-CoV-2) nucleic acids may be present in the submitted sample  additional confirmatory testing may be necessary for epidemiological  and / or clinical management purposes  to differentiate between  SARS-CoV-2 and other Sarbecovirus currently known to infect humans.  If clinically indicated additional testing with an alternate test  methodology [phone]) is advised. The SARS-CoV-2 RNA is generally  detectable in upper and lower respiratory sp ecimens during the acute  phase of infection. The  expected result is Negative. Fact Sheet for Patients:  StrictlyIdeas.no Fact Sheet for Healthcare Providers: BankingDealers.co.za This test is not yet approved or cleared by the [place] FDA and has been authorized for detection and/or diagnosis of SARS-CoV-2 by FDA under an Emergency Use Authorization (EUA).  This EUA will remain in effect (meaning this test can be used) for the duration of the COVID-19 declaration under Section 564(b)(1) of the Act, 21 U.S.C. section 360bbb-3(b)(1), unless the authorization is terminated or revoked sooner. Performed at [facility], [place] [address] [place], [place] [address]   Blood Culture (routine x 2)     Status: None (Preliminary result)   Collection Time: 05/14/19  8:57 PM   Specimen: BLOOD LEFT ARM  Result Value Ref Range Status   Specimen Description   Final    BLOOD LEFT ARM Performed at [facility], [place] [address] [place], [place] [address]    Special Requests   Final    BOTTLES DRAWN AEROBIC AND ANAEROBIC Blood Culture adequate volume Performed at [facility] [address] [place], [place] [address]    Culture   Final    NO GROWTH 4 DAYS Performed at [facility], [place] [address] [place], [place] [address]    Report Status PENDING  Incomplete  Novel Coronavirus,NAA,(SEND-OUT TO REF LAB - TAT 24-48 hrs); Hosp Order     Status: None   Collection Time: 05/14/19 11:50 PM   Specimen: Nasopharyngeal Swab; Respiratory  Result Value Ref Range Status   SARS-CoV-2, NAA NOT DETECTED NOT DETECTED Final    Comment: (NOTE) This test was developed and its performance characteristics determined by [employer]. This test has not been FDA cleared or approved. This test has been authorized by FDA under an Emergency Use Authorization (EUA). This test is only authorized for the duration of time the declaration that circumstances  exist justifying the authorization of the emergency use of in vitro diagnostic tests for detection of SARS-CoV-2 virus and/or diagnosis of COVID-19 infection under section 564(b)(1) of the Act, 21 U.S.C. 458KDX-8(P)(3), unless the authorization is terminated or revoked sooner. When diagnostic testing is negative, the possibility of a false negative result should be considered in the context of a patient's recent exposures and the presence of clinical signs and symptoms consistent with COVID-19. An individual without symptoms of COVID-19 and who is not shedding SARS-CoV-2 virus would expect  to have a negative (not detected) result in this assay. Performed  At: [facility] [place], [place] [id number] Rush Farmer MD WU:[id number]    [place]  Final    Comment: Performed at [facility] [address] [place], [place] [address]  Culture, blood (routine x 2)     Status: None (Preliminary result)   Collection Time: 05/16/19  8:08 PM   Specimen: BLOOD RIGHT HAND  Result Value Ref Range Status   Specimen Description   Final    BLOOD RIGHT HAND Performed at [facility] [address] [place], [place] [address]    Special Requests   Final    BOTTLES DRAWN AEROBIC ONLY Blood Culture adequate volume Performed at [facility] [address], [place], [place] [address]    Culture   Final    NO GROWTH 2 DAYS Performed at [facility] [address] [place], [place] [address]    Report Status PENDING  Incomplete  Culture, blood (routine x 2)     Status: None (Preliminary result)   Collection Time: 05/16/19  8:08 PM   Specimen: BLOOD  Result Value Ref Range Status   Specimen Description   Final    BLOOD RIGHT ANTECUBITAL Performed at [facility] [address] [place], [place] [address]    Special Requests   Final    BOTTLES DRAWN AEROBIC ONLY  Blood Culture adequate volume Performed at [facility] [address], [place], [place] [address]    Culture   Final    NO GROWTH 2 DAYS Performed at [facility] [address] [place], [place] [address]    Report Status PENDING  Incomplete    Impression/Plan: The patient is a 78 y/o WF diabetic with CAD, s/p CABG and PTCI, tachy-brady syndrome, s/p PM, and stage IV CRI admitted with fever and confusion and probable CoNS sepsis.  1. CoNS sepsis - continue vanc dosed for goal trough of 15-20. D/c'ed rocephin as MR-CoNS confirmed. TTE showed no clear evidence IE or infection to PM but did comment on eccentric jet to MV and mild-moderate TR, so proceeded with TEE to further evaluate (results showed no evidence of endocarditis. Repeat blood cxs x 2 (showing NGTD thus far) to assess for clearance of her BSI.  Suspect her CoNS sepsis did arise from her recent cat scratches to her LEs. I have educated the pt and her daughter on this risk and counselled her on methods to lower the risk of acquiring injury from the cat's claws, including wearing longer pants, providing adequate toys for the cat to play with, and using bitter apple spray to her skin to train cat not to bite or scratch her skin. Unfortunately, cannot safely transition her to zyvox due to her thrombocytopenia (see below), so will need to treat with vanc for 2 week duration. Due to her poor kidney function, will place midline tomorrow. Vanc dosing currently q 48 hrs, so not anticipating vanc levels until Sunday per pharmacy. Daughter has asked that we d/c the patient to her home on Sunday afternoon after vanc dose. She will arrange supervision for her there as the patient is refusing SNF placement for tx. IV infusion co. Working to [employer] pt's ABX to allow d/c Sunday once dosing confirmed. Tentative d/c date for vancomycin will be 05/29/2019. Dr. Erlinda Hong, daughter, CM, pharmacist, and infusion co. liaison all aware of  plan.  2. CRI - Pt has known stage IV CRI per records reviewed. Cr fluctuates from 1.8-2.5 over the past year, so uncertain of true baseline Cr. Daughter confirms that the patient does see a nephrologist as an OP. Unfortunately, her infection necessitates vancomycin for now. Appreciate pharmacy assistance with dosing. Check BMP daily to follow Cr trend to assist with appropriate ABX dosing. Will place midline rather than PICC for OP IV ABX due to her known CRI.  3. Thrombocytopenia - Uncertain if this is the patient's baseline or if this is more due to her current sepsis. TTE and TEE were both negative for endocarditis or PM wire vegetation. Baseline thrombocytopenia makes zyvox a poor alternative option to vancomycin. Daughter made aware.

## 2019-05-18 NOTE — Care Management Important Message (Signed)
Important Message  Patient Details IM Letter given to Nancy Marus RN to present to the Patient Name: [name] MRN: [id number] Date of Birth: 12-05-1940   Medicare Important Message Given:  Yes     Kerin Salen 05/18/2019, 9:58 AM

## 2019-05-18 NOTE — H&P (Signed)
   INTERVAL PROCEDURE H&P  History and Physical Interval Note:  05/18/2019 12:26 PM  [name] has presented today for their planned procedure. The various methods of treatment have been discussed with the patient and family. After consideration of risks, benefits and other options for treatment, the patient has consented to the procedure.  The patients' outpatient history has been reviewed, patient examined, and no change in status from most recent office note within the past 30 days. I have reviewed the patients' chart and labs and will proceed as planned. Questions were answered to the patient's satisfaction.   [name] Casino, MD, [facility], [place] Director of the [facility] Diplomate of the American Board of Clinical Lipidology Attending Cardiologist  Direct Dial: [phone]  Fax: [phone]  Website:  [name] 05/18/2019, 12:26 PM

## 2019-05-18 NOTE — Progress Notes (Signed)
[facility] for Heparin Indication: atrial fibrillation on PTA Xarelto  Allergies  Allergen Reactions  . Clonidine Derivatives Other (See Comments)    Bradycardia and fatigue   . Sulfa Antibiotics Other (See Comments)    Unknown  . Crestor [Rosuvastatin Calcium] Other (See Comments)    Other reaction(s): tired and weak  . Epinephrine Other (See Comments)    Abnormal feeling. Dental exam/injection of local w/ epi.  Marland Kitchen Hydralazine Other (See Comments)    Nausea/gi upset   . Losartan Other (See Comments)    Hyperkalemia   . Other Other (See Comments)    MANGO'S - WELTS ALL OVER   Patient Measurements: Height: 5' 0.98" (154.9 cm) Weight: 138 lb 14.2 oz (63 kg) IBW/kg (Calculated) : 47.76 Heparin Dosing Weight: 60.8  Vital Signs: Temp: 99.1 F (37.3 C) (07/24 0512) Temp Source: Oral (07/24 0512) BP: 112/91 (07/24 0512) Pulse Rate: 59 (07/24 0512)  Labs: Recent Labs    05/16/19 1027 05/16/19 2008 05/17/19 0914 05/18/19 0434  HGB 9.0*  --  8.2* 7.4*  HCT 28.4*  --  25.9* 22.8*  PLT 100*  --  93* 107*  APTT 62* 40* 83* 91*  HEPARINUNFRC 1.12*  --  0.65 0.42  CREATININE  --   --  1.89* 1.73*   Estimated Creatinine Clearance: 22.4 mL/min (A) (by C-G formula based on SCr of 1.73 mg/dL (H)).  Medications:  Scheduled:  . carvedilol  12.5 mg Oral BID  . clopidogrel  75 mg Oral Daily  . ferrous sulfate  325 mg Oral Q breakfast  . insulin aspart  0-9 Units Subcutaneous TID WC  . insulin glargine  22 Units Subcutaneous Daily  . isosorbide mononitrate  30 mg Oral Daily  . levothyroxine  88 mcg Oral Q0600  . rosuvastatin  10 mg Oral Daily  . vitamin B-12  100 mcg Oral Daily   Infusions:  . sodium chloride    . heparin 700 Units/hr (05/16/19 2243)  . vancomycin      Assessment: 70 yoF with fever, weakness on PTA xarelto for A-fib. LD 7/20 at unknown time  Baseline labs:  H/H=9.8/30, plts = 152, aptt=36   , INR= 2.2 , HL=  artificially elevated at > 2.2, due to Xarelto Will use aptt to follow as HL is falsely elevated d/t xarelto  7/21: APTT SUPRAtherapeutic on current IV heparin rate of 800 units/hr 7/22: Hep rate reduced for elevated aPTT, then increased again with low aPTT  Today, 05/18/2019 Hep level 0.42, aPTT 91 sec on 700 units/hr Hgb 7.4, further decr, Plt 107, low but improved   Goal of Therapy:  Heparin level 0.3-0.7 units/ml Heparin level 66-102 units/ml  Monitor platelets by anticoagulation protocol: Yes   Plan:   Continue Heparin at 700 units/hr  Daily CBC, daily HL  TTE completed, plan TEE, then transition back to Xarelto 15mg  daily with supper  Minda Ditto PharmD 05/18/2019 7:59 AM

## 2019-05-18 NOTE — Transfer of Care (Signed)
Immediate Anesthesia Transfer of Care Note  Patient: [name]  Procedure(s) Performed: TRANSESOPHAGEAL ECHOCARDIOGRAM (TEE) (N/A )  Patient Location: Endoscopy Unit  Anesthesia Type:MAC  Level of Consciousness: drowsy and patient cooperative  Airway & Oxygen Therapy: Patient Spontanous Breathing and Patient connected to nasal cannula oxygen  Post-op Assessment: Report given to RN and Post -op Vital signs reviewed and stable  Post vital signs: Reviewed  Last Vitals:  Vitals Value Taken Time  BP 153/54 05/18/19 1325  Temp 36.4 C 05/18/19 1325  Pulse 61 05/18/19 1327  Resp 16 05/18/19 1327  SpO2 100 % 05/18/19 1327  Vitals shown include unvalidated device data.  Last Pain:  Vitals:   05/18/19 1325  TempSrc: Temporal  PainSc: 0-No pain      Patients Stated Pain Goal: 0 (05/39/76 7341)  Complications: No apparent anesthesia complications

## 2019-05-18 NOTE — CV Procedure (Signed)
TRANSESOPHAGEAL ECHOCARDIOGRAM (TEE) NOTE  INDICATIONS: infective endocarditis  PROCEDURE:   Informed consent was obtained prior to the procedure. The risks, benefits and alternatives for the procedure were discussed and the patient comprehended these risks.  Risks include, but are not limited to, cough, sore throat, vomiting, nausea, somnolence, esophageal and stomach trauma or perforation, bleeding, low blood pressure, aspiration, pneumonia, infection, trauma to the teeth and death.    After a procedural time-out, the patient was given propofol per anesthesia for sedation.  The patient's heart rate, blood pressure, and oxygen saturation are monitored continuously during the procedure.The oropharynx was anesthetized with topical cetacaine.  The transesophageal probe was inserted in the esophagus and stomach without difficulty and multiple views were obtained.  The patient was kept under observation until the patient left the procedure room.  The patient left the procedure room in stable condition.   Agitated microbubble saline contrast was not administered.  COMPLICATIONS:    There were no immediate complications.  Findings:  1. LEFT VENTRICLE: The left ventricular wall thickness is normal.  The left ventricular cavity is normal in size. Wall motion is normal.  LVEF is 55-60%.  2. RIGHT VENTRICLE:  The right ventricle is mildly hypokinetic. Pacer wires are noted without evidence for thrombus or infection.  3. LEFT ATRIUM:  The left atrium is moderately dilated in size without any thrombus or masses.  There is not spontaneous echo contrast ("smoke") in the left atrium consistent with a low flow state.  4. LEFT ATRIAL APPENDAGE:  The left atrial appendage is free of any thrombus or masses. The appendage has single lobes. Pulse doppler indicates moderate flow in the appendage.  5. ATRIAL SEPTUM:  The atrial septum appears intact and is free of thrombus and/or masses. There is lipomatous  hypertrophy. There is no evidence for interatrial shunting by color doppler.  6. RIGHT ATRIUM:  The right atrium is mildly dilated in size and function without any thrombus or masses. Pacer wires noted without evidence for thrombus or infection.  7. MITRAL VALVE:  The mitral valve is normal in structure and function with Mild regurgitation.  There were no vegetations or stenosis.  8. AORTIC VALVE:  The aortic valve is trileaflet, normal in structure and function with trivial regurgitation.  There were no vegetations or stenosis  9. TRICUSPID VALVE:  The tricuspid valve is normal in structure and function with mild to moderate regurgitation.  There were no vegetations or stenosis  10.  PULMONIC VALVE:  The pulmonic valve is normal in structure and function with trivia regurgitation.  There were no vegetations or stenosis.   11. AORTIC ARCH, ASCENDING AND DESCENDING AORTA:  There was grade 2 Ron Parker et. Al, 1992) atherosclerosis of the ascending aorta, aortic arch, or proximal descending aorta.  12. PULMONARY VEINS: Anomalous pulmonary venous return was not noted.  13. PERICARDIUM: The pericardium appeared normal and non-thickened.  There is no pericardial effusion.  IMPRESSION:   1. No endocarditis 2. Right-heart pacer wires without thrombus or vegetation 3. No LAA thrombus 4. Lipomatous hypertrophy of the interatrial septum without PFO by color doppler 5. Mild MR 6. Mild to moderate TR 7. LVEF 55-60%  RECOMMENDATIONS:    1.  No evidence for endocarditis - recommend treatment of bacteremia per ID recommendations.  Time Spent Directly with the Patient:  60 minutes   [name], MD, [facility], [place] Director of the Advanced Lipid Disorders &  Cardiovascular Risk Reduction  Clinic Diplomate of the American Board of Clinical Lipidology Attending Cardiologist  Direct Dial: [phone]  Fax: [phone]  Website:   [name] 05/18/2019, 1:18 PM

## 2019-05-18 NOTE — Progress Notes (Addendum)
PROGRESS NOTE  [name] ZSW:[id number] DOB: Sep 15, 1941 DOA: 05/14/2019 PCP: Leeroy Cha, MD  HPI/Recap of past 24 hours:   She returned from TEE, daughter is at bedside   Fever has resolved, tmax 99.1,  she denies pain, no sob, no cough, no diarrhea, she is anxious, she wants to go home   Assessment/Plan: Principal Problem:   SIRS (systemic inflammatory response syndrome) ([place]) Active Problems:   Insulin dependent diabetes mellitus ([place])   Hyperlipidemia LDL goal <70   S/P CABG (coronary artery bypass graft), 12/04/11   Essential hypertension   Anemia of chronic disease   CKD (chronic kidney disease) stage 4, GFR 15-29 ml/min (HCC)   Chronic diastolic CHF (congestive heart failure) (HCC)   CAD S/P percutaneous coronary angioplasty   Fever   Acute encephalopathy   Bacteremia due to Gram-positive bacteria   S/P placement of cardiac pacemaker  Acute metabolic encephalopathy /GPC bacteremia with fever, reports cat scratch at home -covid test negative x2 -With h/o pacemaker , negative TEE -ID recommended two weeks of iv vanc, zyvox is not a good option due to thrombocytopenia per ID -Will place mid line, vanc level on Sunday (10am and 1pm), then potential discharge Sunday afternoon.  -encephalopathy appear has resolved, per daughter patient has improved, now close to baseline , patient at baseline has memory impairment  -case manager notified to arrange home health   Insulin dependent dm2 a1c 7.1 Continue lantus, ssi  H/o CAD status post CABG and stenting denies any chest pain.  On Plavix carvedilol statins H/o  paroxysmal atrial fibrillation on Xarelto. H/o pacemaker On heparin drip for now, resume xarelto after TEE.   On beta-blockers. H/o Chronic diastolic CHF holding torsemide for now. H/o CKDIV, appear at baseline Anemia of chronic diease: no sign of overt bleeding, monitor hgb   Memory impairment -possible mild baseline dementia, daughter reports  patient is being followed by a neurologist  FTT: PT eval, home health   Code Status: full  Family Communication: patient , daughter at bedside  Disposition Plan: n   Consultants:  ID  Cardiology for TEE  Procedures:  TEE on 7/24  Antibiotics:  As above   Objective: BP (!) 159/60 (BP Location: Right Arm)   Pulse 66   Temp 97.8 F (36.6 C) (Oral)   Resp 18   Ht 5' 0.98" (1.549 m)   Wt 63 kg   SpO2 100%   BMI 26.26 kg/m   Intake/Output Summary (Last 24 hours) at 05/18/2019 1711 Last data filed at 05/18/2019 3220 Gross per 24 hour  Intake 0 ml  Output -  Net 0 ml   Filed Weights   05/15/19 1259 05/18/19 0512  Weight: 63.1 kg 63 kg    Exam: Patient is examined daily including today on 05/18/2019, exams remain the same as of yesterday except that has changed    General:  NAD, very hard of hearing, not oriented to the year but to the months and place  Cardiovascular: RRR  Respiratory: CTABL  Abdomen: Soft/ND/NT, positive BS  Musculoskeletal: No Edema  Neuro: alert, slightly confused about the year  Data Reviewed: Basic Metabolic Panel: Recent Labs  Lab 05/14/19 2052 05/15/19 0500 05/17/19 0914 05/18/19 0434  NA 137 138 142 137  K 4.3 4.7 4.1 4.1  CL 103 105 109 108  CO2 22 21* 22 20*  GLUCOSE 197* 202* 89 139*  BUN 59* 58* 52* 46*  CREATININE 2.18* 2.32* 1.89* 1.73*  CALCIUM 8.7* 8.1* 8.3* 8.1*  MG  --   --   --  2.5*   Liver Function Tests: Recent Labs  Lab 05/14/19 2052 05/15/19 0500  AST 30 32  ALT 17 17  ALKPHOS 69 67  BILITOT 0.8 0.8  PROT 7.6 7.2  ALBUMIN 4.1 3.9   No results for input(s): LIPASE, AMYLASE in the last 168 hours. No results for input(s): AMMONIA in the last 168 hours. CBC: Recent Labs  Lab 05/14/19 2052 05/15/19 0500 05/16/19 1027 05/17/19 0914 05/18/19 0434  WBC 7.7 9.8 5.7 5.3 6.2  NEUTROABS 6.4 8.4*  --   --   --   HGB 9.8* 9.7* 9.0* 8.2* 7.4*  HCT 30.0* 30.2* 28.4* 25.9* 22.8*  MCV 98.7  100.7* 101.8* 99.2 98.7  PLT 152 127* 100* 93* 107*   Cardiac Enzymes:   No results for input(s): CKTOTAL, CKMB, CKMBINDEX, TROPONINI in the last 168 hours. BNP (last 3 results) No results for input(s): BNP in the last 8760 hours.  ProBNP (last 3 results) No results for input(s): PROBNP in the last 8760 hours.  CBG: Recent Labs  Lab 05/17/19 1657 05/17/19 2213 05/18/19 0815 05/18/19 1142 05/18/19 1236  GLUCAP 144* 133* 119* 112* 115*    Recent Results (from the past 240 hour(s))  Blood Culture (routine x 2)     Status: Abnormal   Collection Time: 05/14/19  8:52 PM   Specimen: BLOOD RIGHT ARM  Result Value Ref Range Status   Specimen Description   Final    BLOOD RIGHT ARM Performed at [facility], [place] [address] [place], [place] [address]    Special Requests   Final    BOTTLES DRAWN AEROBIC AND ANAEROBIC Blood Culture results may not be optimal due to an inadequate volume of blood received in culture bottles Performed at [place] [address] [place], [place] [address]    Culture  Setup Time   Final    GRAM POSITIVE COCCI IN BOTH AEROBIC AND ANAEROBIC BOTTLES Organism ID to follow CRITICAL RESULT CALLED TO, READ BACK BY AND VERIFIED WITH: J LEGGE PHARMD 1814 05/15/19 A BROWNING    Culture (A)  Final    STAPHYLOCOCCUS SPECIES (COAGULASE NEGATIVE) THE SIGNIFICANCE OF ISOLATING THIS ORGANISM FROM A SINGLE SET OF BLOOD CULTURES WHEN MULTIPLE SETS ARE DRAWN IS UNCERTAIN. PLEASE NOTIFY THE MICROBIOLOGY DEPARTMENT WITHIN ONE WEEK IF SPECIATION AND SENSITIVITIES ARE REQUIRED. Performed at [facility], [place] [address] [place], [place] [address]    Report Status 05/17/2019 FINAL  Final  Urine culture     Status: Abnormal   Collection Time: 05/14/19  8:52 PM   Specimen: In/Out Cath Urine  Result Value Ref Range Status   Specimen Description   Final    IN/OUT CATH URINE Performed at [facility] [address]  [place], [place] [address]    Special Requests   Final    NONE Performed at [facility], [place] [address], [place], [place] [address]    Culture (A)  Final    <10,000 COLONIES/mL INSIGNIFICANT GROWTH Performed at [facility] [address], [place], [place] [address]    Report Status 05/15/2019 FINAL  Final  Blood Culture ID Panel (Reflexed)     Status: Abnormal   Collection Time: 05/14/19  8:52 PM  Result Value Ref Range Status   Enterococcus species NOT DETECTED NOT DETECTED Final   Listeria monocytogenes NOT DETECTED NOT DETECTED Final   Staphylococcus species DETECTED (  A) NOT DETECTED Final    Comment: Methicillin (oxacillin) resistant coagulase negative staphylococcus. Possible blood culture contaminant (unless isolated from more than one blood culture draw or clinical case suggests pathogenicity). No antibiotic treatment is indicated for blood  culture contaminants. CRITICAL RESULT CALLED TO, READ BACK BY AND VERIFIED WITH: Christean Grief PHARMD 1814 05/15/19 A BROWNING    Staphylococcus aureus (BCID) NOT DETECTED NOT DETECTED Final   Methicillin resistance DETECTED (A) NOT DETECTED Final    Comment: CRITICAL RESULT CALLED TO, READ BACK BY AND VERIFIED WITH: Christean Grief PHARMD 1814 05/15/19 A BROWNING    Streptococcus species DETECTED (A) NOT DETECTED Final    Comment: Not Enterococcus species, Streptococcus agalactiae, Streptococcus pyogenes, or Streptococcus pneumoniae. CRITICAL RESULT CALLED TO, READ BACK BY AND VERIFIED WITH: Christean Grief PHARMD 1814 05/15/19 A BROWNING    Streptococcus agalactiae NOT DETECTED NOT DETECTED Final   Streptococcus pneumoniae NOT DETECTED NOT DETECTED Final   Streptococcus pyogenes NOT DETECTED NOT DETECTED Final   Acinetobacter baumannii NOT DETECTED NOT DETECTED Final   Enterobacteriaceae species NOT DETECTED NOT DETECTED Final   Enterobacter cloacae complex NOT DETECTED NOT DETECTED Final   Escherichia coli NOT DETECTED NOT DETECTED  Final   Klebsiella oxytoca NOT DETECTED NOT DETECTED Final   Klebsiella pneumoniae NOT DETECTED NOT DETECTED Final   Proteus species NOT DETECTED NOT DETECTED Final   Serratia marcescens NOT DETECTED NOT DETECTED Final   Haemophilus influenzae NOT DETECTED NOT DETECTED Final   Neisseria meningitidis NOT DETECTED NOT DETECTED Final   Pseudomonas aeruginosa NOT DETECTED NOT DETECTED Final   Candida albicans NOT DETECTED NOT DETECTED Final   Candida glabrata NOT DETECTED NOT DETECTED Final   Candida krusei NOT DETECTED NOT DETECTED Final   Candida parapsilosis NOT DETECTED NOT DETECTED Final   Candida tropicalis NOT DETECTED NOT DETECTED Final    Comment: Performed at [facility], [place] [address], [place], [place] [address]  SARS Coronavirus 2 (CEPHEID- Performed in [place] hospital lab), Hosp Order     Status: None   Collection Time: 05/14/19  8:55 PM   Specimen: Nasopharyngeal Swab  Result Value Ref Range Status   SARS Coronavirus 2 NEGATIVE NEGATIVE Final    Comment: (NOTE) If result is NEGATIVE SARS-CoV-2 target nucleic acids are NOT DETECTED. The SARS-CoV-2 RNA is generally detectable in upper and lower  respiratory specimens during the acute phase of infection. The lowest  concentration of SARS-CoV-2 viral copies this assay can detect is 250  copies / mL. A negative result does not preclude SARS-CoV-2 infection  and should not be used as the sole basis for treatment or other  patient management decisions.  A negative result may occur with  improper specimen collection / handling, submission of specimen other  than nasopharyngeal swab, presence of viral mutation(s) within the  areas targeted by this assay, and inadequate number of viral copies  (<250 copies / mL). A negative result must be combined with clinical  observations, patient history, and epidemiological information. If result is POSITIVE SARS-CoV-2 target nucleic acids are DETECTED. The SARS-CoV-2 RNA is  generally detectable in upper and lower  respiratory specimens dur ing the acute phase of infection.  Positive  results are indicative of active infection with SARS-CoV-2.  Clinical  correlation with patient history and other diagnostic information is  necessary to determine patient infection status.  Positive results do  not rule out bacterial infection or co-infection with other viruses. If result is PRESUMPTIVE POSTIVE SARS-CoV-2 nucleic acids  MAY BE PRESENT.   A presumptive positive result was obtained on the submitted specimen  and confirmed on repeat testing.  While 2019 novel coronavirus  (SARS-CoV-2) nucleic acids may be present in the submitted sample  additional confirmatory testing may be necessary for epidemiological  and / or clinical management purposes  to differentiate between  SARS-CoV-2 and other Sarbecovirus currently known to infect humans.  If clinically indicated additional testing with an alternate test  methodology [phone]) is advised. The SARS-CoV-2 RNA is generally  detectable in upper and lower respiratory sp ecimens during the acute  phase of infection. The expected result is Negative. Fact Sheet for Patients:  StrictlyIdeas.no Fact Sheet for Healthcare Providers: BankingDealers.co.za This test is not yet approved or cleared by the [place] FDA and has been authorized for detection and/or diagnosis of SARS-CoV-2 by FDA under an Emergency Use Authorization (EUA).  This EUA will remain in effect (meaning this test can be used) for the duration of the COVID-19 declaration under Section 564(b)(1) of the Act, 21 U.S.C. section 360bbb-3(b)(1), unless the authorization is terminated or revoked sooner. Performed at [facility], [place] [address] [place], [place] [address]   Blood Culture (routine x 2)     Status: None (Preliminary result)   Collection Time: 05/14/19  8:57 PM   Specimen: BLOOD  LEFT ARM  Result Value Ref Range Status   Specimen Description   Final    BLOOD LEFT ARM Performed at [facility], [place] [address], [place], [place] [address]    Special Requests   Final    BOTTLES DRAWN AEROBIC AND ANAEROBIC Blood Culture adequate volume Performed at [facility] [address] [place], [place] [address]    Culture   Final    NO GROWTH 4 DAYS Performed at [facility], [place] [address] [place], [place] [address]    Report Status PENDING  Incomplete  Novel Coronavirus,NAA,(SEND-OUT TO REF LAB - TAT 24-48 hrs); Hosp Order     Status: None   Collection Time: 05/14/19 11:50 PM   Specimen: Nasopharyngeal Swab; Respiratory  Result Value Ref Range Status   SARS-CoV-2, NAA NOT DETECTED NOT DETECTED Final    Comment: (NOTE) This test was developed and its performance characteristics determined by [employer]. This test has not been FDA cleared or approved. This test has been authorized by FDA under an Emergency Use Authorization (EUA). This test is only authorized for the duration of time the declaration that circumstances exist justifying the authorization of the emergency use of in vitro diagnostic tests for detection of SARS-CoV-2 virus and/or diagnosis of COVID-19 infection under section 564(b)(1) of the Act, 21 U.S.C. 254YHC-6(C)(3), unless the authorization is terminated or revoked sooner. When diagnostic testing is negative, the possibility of a false negative result should be considered in the context of a patient's recent exposures and the presence of clinical signs and symptoms consistent with COVID-19. An individual without symptoms of COVID-19 and who is not shedding SARS-CoV-2 virus would expect to have a negative (not detected) result in this assay. Performed  At: [facility] [place], [place] [id number] Rush Farmer MD [place]:[id number]    [place]  Final     Comment: Performed at [facility] [address] [place], [place] [address]  Culture, blood (routine x 2)     Status: None (Preliminary result)   Collection Time: 05/16/19  8:08 PM   Specimen: BLOOD RIGHT HAND  Result Value Ref Range Status   Specimen Description   Final    BLOOD RIGHT HAND Performed at [facility] [address], [place], [place] [address]    Special Requests   Final    BOTTLES DRAWN AEROBIC ONLY Blood Culture adequate volume Performed at [facility] [address], [place], [place] [address]    Culture   Final    NO GROWTH 2 DAYS Performed at [facility] [address], [place], [place] [address]    Report Status PENDING  Incomplete  Culture, blood (routine x 2)     Status: None (Preliminary result)   Collection Time: 05/16/19  8:08 PM   Specimen: BLOOD  Result Value Ref Range Status   Specimen Description   Final    BLOOD RIGHT ANTECUBITAL Performed at [facility] [address] [place], [place] [address]    Special Requests   Final    BOTTLES DRAWN AEROBIC ONLY Blood Culture adequate volume Performed at [facility] [address], [place], [place] [address]    Culture   Final    NO GROWTH 2 DAYS Performed at [facility] [address] [place], [place] [address]    Report Status PENDING  Incomplete     Studies: No results found.  Scheduled Meds: . carvedilol  12.5 mg Oral BID  . clopidogrel  75 mg Oral Daily  . ferrous sulfate  325 mg Oral Q breakfast  . insulin aspart  0-9 Units Subcutaneous TID WC  . insulin glargine  22 Units Subcutaneous Daily  . isosorbide mononitrate  30 mg Oral Daily  . levothyroxine  88 mcg Oral Q0600  . rosuvastatin  10 mg Oral Daily  . sodium chloride flush  10-40 mL Intracatheter Q12H  . vitamin B-12  100 mcg Oral Daily    Continuous Infusions: . sodium chloride    . heparin 700 Units/hr  (05/18/19 0954)  . vancomycin 750 mg (05/18/19 1545)     Time spent: 55mins, case discussed with ID I have personally reviewed and interpreted on  05/18/2019 daily labs, tele strips, imagings as discussed above under date review session and assessment and plans.  I reviewed all nursing notes, pharmacy notes, consultant notes,  vitals, pertinent old records  I have discussed plan of care as described above with RN , patient and family on 05/18/2019   Florencia Reasons MD, PhD  [facility] Pager [phone]. If 7PM-7AM, please contact night-coverage at [email or web address], password [email or web address] 05/18/2019, 5:11 PM  LOS: 3 days

## 2019-05-18 NOTE — Progress Notes (Signed)
Went to reposition patient and found that patient had scratched her right ear trying to take her hearing aide back out. It was bleeding. Cleaned blood up off of patient and bleeding stopped.  Will continue to monitor patient closely.

## 2019-05-18 NOTE — Progress Notes (Signed)
  Echocardiogram Echocardiogram Transesophageal has been performed.  [name] 05/18/2019, 1:28 PM

## 2019-05-19 ENCOUNTER — Inpatient Hospital Stay (HOSPITAL_COMMUNITY): Payer: Medicare Other

## 2019-05-19 LAB — CBC
HCT: 17.2 % — ABNORMAL LOW (ref 36.0–46.0)
HCT: 17.4 % — ABNORMAL LOW (ref 36.0–46.0)
HCT: 24.4 % — ABNORMAL LOW (ref 36.0–46.0)
Hemoglobin: 5.4 g/dL — CL (ref 12.0–15.0)
Hemoglobin: 5.4 g/dL — CL (ref 12.0–15.0)
Hemoglobin: 8 g/dL — ABNORMAL LOW (ref 12.0–15.0)
MCH: 31.8 pg (ref 26.0–34.0)
MCH: 30.9 pg (ref 26.0–34.0)
MCH: 30.9 pg (ref 26.0–34.0)
MCHC: 31.4 g/dL (ref 30.0–36.0)
MCHC: 31 g/dL (ref 30.0–36.0)
MCHC: 32.8 g/dL (ref 30.0–36.0)
MCV: 101.2 fL — ABNORMAL HIGH (ref 80.0–100.0)
MCV: 99.4 fL (ref 80.0–100.0)
MCV: 94.2 fL (ref 80.0–100.0)
Platelets: 126 10*3/uL — ABNORMAL LOW (ref 150–400)
Platelets: 141 10*3/uL — ABNORMAL LOW (ref 150–400)
Platelets: 116 10*3/uL — ABNORMAL LOW (ref 150–400)
RBC: 1.7 MIL/uL — ABNORMAL LOW (ref 3.87–5.11)
RBC: 1.75 MIL/uL — ABNORMAL LOW (ref 3.87–5.11)
RBC: 2.59 MIL/uL — ABNORMAL LOW (ref 3.87–5.11)
RDW: 13.2 % (ref 11.5–15.5)
RDW: 13.2 % (ref 11.5–15.5)
RDW: 14.3 % (ref 11.5–15.5)
WBC: 7.6 10*3/uL (ref 4.0–10.5)
WBC: 8.9 10*3/uL (ref 4.0–10.5)
WBC: 8.6 10*3/uL (ref 4.0–10.5)
nRBC: 0 % (ref 0.0–0.2)
nRBC: 0.5 % — ABNORMAL HIGH (ref 0.0–0.2)

## 2019-05-19 LAB — FOLATE: Folate: 21.7 ng/mL (ref >5.9)

## 2019-05-19 LAB — GLUCOSE, CAPILLARY
Glucose-Capillary: 91 mg/dL (ref 70–99)
Glucose-Capillary: 85 mg/dL (ref 70–99)
Glucose-Capillary: 80 mg/dL (ref 70–99)
Glucose-Capillary: 95 mg/dL (ref 70–99)
Glucose-Capillary: 104 mg/dL — ABNORMAL HIGH (ref 70–99)

## 2019-05-19 LAB — BASIC METABOLIC PANEL
Anion gap: 7 (ref 5–15)
BUN: 54 mg/dL — ABNORMAL HIGH (ref 8–23)
CO2: 21 mmol/L — ABNORMAL LOW (ref 22–32)
Calcium: 7.7 mg/dL — ABNORMAL LOW (ref 8.9–10.3)
Chloride: 112 mmol/L — ABNORMAL HIGH (ref 98–111)
Creatinine, Ser: 2.11 mg/dL — ABNORMAL HIGH (ref 0.44–1.00)
GFR calc Af Amer: 25 mL/min — ABNORMAL LOW (ref >60)
GFR calc non Af Amer: 22 mL/min — ABNORMAL LOW (ref >60)
Glucose, Bld: 76 mg/dL (ref 70–99)
Potassium: 4.3 mmol/L (ref 3.5–5.1)
Sodium: 140 mmol/L (ref 135–145)

## 2019-05-19 LAB — CULTURE, BLOOD (ROUTINE X 2)
Culture: NO GROWTH
Special Requests: ADEQUATE

## 2019-05-19 LAB — IRON AND TIBC
Iron: 39 ug/dL (ref 28–170)
Saturation Ratios: 21 % (ref 10.4–31.8)
TIBC: 187 ug/dL — ABNORMAL LOW (ref 250–450)
UIBC: 148 ug/dL

## 2019-05-19 LAB — VITAMIN B12: Vitamin B-12: 788 pg/mL (ref 180–914)

## 2019-05-19 LAB — HEPARIN LEVEL (UNFRACTIONATED): Heparin Unfractionated: 0.21 IU/mL — ABNORMAL LOW (ref 0.30–0.70)

## 2019-05-19 LAB — PREPARE RBC (CROSSMATCH)

## 2019-05-19 MED ORDER — CARVEDILOL 3.125 MG PO TABS
3.1250 mg | ORAL_TABLET | Freq: Two times a day (BID) | ORAL | Status: DC
  Administered 2019-05-19 (×2): 3.125 mg via ORAL
  Filled 2019-05-19 (×2): qty 1

## 2019-05-19 MED ORDER — ISOSORBIDE MONONITRATE ER 30 MG PO TB24
15.0000 mg | ORAL_TABLET | Freq: Once | ORAL | Status: AC
  Administered 2019-05-19: 15 mg via ORAL
  Filled 2019-05-19: qty 1

## 2019-05-19 MED ORDER — SODIUM CHLORIDE 0.9% IV SOLUTION
Freq: Once | INTRAVENOUS | Status: AC
  Administered 2019-05-19: 10:00:00 via INTRAVENOUS

## 2019-05-19 MED ORDER — SODIUM CHLORIDE 0.9% IV SOLUTION
Freq: Once | INTRAVENOUS | Status: AC
  Administered 2019-05-19: 13:00:00 via INTRAVENOUS

## 2019-05-19 MED ORDER — LIDOCAINE 5 % EX PTCH
1.0000 | MEDICATED_PATCH | CUTANEOUS | Status: DC
  Administered 2019-05-19: 1 via TRANSDERMAL
  Filled 2019-05-19: qty 1

## 2019-05-19 NOTE — Progress Notes (Signed)
Upon shift change pt noted to be pale and abdomen is distended and tender in LMQ and LLQ. Having dry heaves without vomiting. States she "feels like she is going to pass out". Rapid Response called and Md notified. Eulas Post, RN

## 2019-05-19 NOTE — Significant Event (Signed)
Rapid Response Event Note  Overview: Time Called: 6606 Arrival Time: 3016 Event Type: Neurologic  Initial Focused Assessment: Pt pale in color resting in bed. Alert and oriented x4. Abdomen distended pt belching and complaining of sore pain in LLQ. Initial Vital Signs: HR 65 A-fib BP 108/58 (74) O2 100% 2LNC   Interventions: MD to bedside, 2 units PRBC ordered. Abd x-ray and CT Abdomen ordered.  Plan of Care (if not transferred): Pt transferred to SDU [facility] [place]

## 2019-05-19 NOTE — Progress Notes (Signed)
CRITICAL VALUE ALERT  Critical Value:  hgb 5.4  Date & Time Notied:  05/19/19 @ 1011  Provider Notified:  Dr. Erlinda Hong already aware according to note   Orders Received/Actions taken: continue with current treatment

## 2019-05-19 NOTE — Progress Notes (Signed)
Pharmacy Antibiotic Note  [name] is a 78 y.o. female with hx CKD presented to the ED on 7/20 with c/o generalized weakness, fever and confusion. She's currently on vancomycin for CoNS sepsis (s/p TEE on 7/23- negative for  Vegetation).  Plan for med line placement and to treat with 2 weeks of abx.   Today, 05/19/2019: - day #4 of abx - ID recommends to treat for 2 weeks - Tmax 99.1, wbc wnl - scr labile, up 2.11 (crcl~18)   Plan: - continue vancomycin 750 mg IV q48h for now - Due to changing renal function, will get trough level on 7/26 when next dose is due to assess drug clearance and adjust vancomycin dose as needed based on level - f/u renal function - f/u with plan for mid line placement ________________________________________  Height: 5' 0.98" (154.9 cm) Weight: 138 lb 10.7 oz (62.9 kg) IBW/kg (Calculated) : 47.76  Temp (24hrs), Avg:98.4 F (36.9 C), Min:97.5 F (36.4 C), Max:99.1 F (37.3 C)  Recent Labs  Lab 05/14/19 2052 05/14/19 2053 05/15/19 0500 05/16/19 1027 05/17/19 0914 05/18/19 0434 05/19/19 0511  WBC 7.7  --  9.8 5.7 5.3 6.2 7.6  CREATININE 2.18*  --  2.32*  --  1.89* 1.73* 2.11*  LATICACIDVEN  --  1.1  --   --   --   --   --     Estimated Creatinine Clearance: 18.4 mL/min (A) (by C-G formula based on SCr of 2.11 mg/dL (H)).    Allergies  Allergen Reactions  . Clonidine Derivatives Other (See Comments)    Bradycardia and fatigue   . Sulfa Antibiotics Other (See Comments)    Unknown  . Crestor [Rosuvastatin Calcium] Other (See Comments)    Other reaction(s): tired and weak  . Epinephrine Other (See Comments)    Abnormal feeling. Dental exam/injection of local w/ epi.  Marland Kitchen Hydralazine Other (See Comments)    Nausea/gi upset   . Losartan Other (See Comments)    Hyperkalemia   . Other Other (See Comments)    MANGO'S - WELTS ALL OVER   Antimicrobials this admission: 7/20 Rocephin>>7/22. Received 2 doses for UTI 7/22  Vancomycin>>  Dose adjustments this admission: 7/24: Vanc 750mg  q48 > AUC 512, with SCr 2.32, with decreased SCr 1.73 > AUC 410 - dose 1gm q48 > AUC 547, will cont 750mg  q48  Microbiology results: 7/20BCx: BCID 7/21 resulted in 1 of 4 bottles with Strep sp, MR-CoNS.  Strep spp never grew on cx, just on BCID 7/20UCx: insignificant growth- final 7/22 rpt BCx x2: ngtd  Thank you for allowing pharmacy to be a part of this patient's care.  Lynelle Doctor 05/19/2019 7:33 AM

## 2019-05-19 NOTE — Progress Notes (Addendum)
PROGRESS NOTE  [name] QZE:[id number] DOB: Nov 20, 1940 DOA: 05/14/2019 PCP: Leeroy Cha, MD  HPI/Recap of past 24 hours:   hgb dropped to 5.4, she appear pale, abdomen appear distended, reports left ab pain, she is belching, no vomiting   She reports feeling weak and about to pass out  Fever has resolved, tmax 99.1,  no sob, no cough, no diarrhea, No overt signs of bleeding bp and heart rate has been stable   Assessment/Plan: Principal Problem:   SIRS (systemic inflammatory response syndrome) ([place]) Active Problems:   Insulin dependent diabetes mellitus ([place])   Hyperlipidemia LDL goal <70   S/P CABG (coronary artery bypass graft), 12/04/11   Essential hypertension   Anemia of chronic disease   CKD (chronic kidney disease) stage 4, GFR 15-29 ml/min (HCC)   Chronic diastolic CHF (congestive heart failure) (HCC)   CAD S/P percutaneous coronary angioplasty   Fever   Acute encephalopathy   Bacteremia due to Gram-positive bacteria   S/P placement of cardiac pacemaker     Acute metabolic encephalopathy on presentation /GPC bacteremia with fever (presenting symptoms), - reports cat scratch at home -covid test negative x2 -With h/o pacemaker , negative TEE -ID recommended two weeks of iv vanc, zyvox is not a good option due to thrombocytopenia per ID -Will place mid line, vanc level on Sunday (10am and 1pm),  -encephalopathy appear has resolved, per daughter patient has improved the last two days, now close to baseline , patient at baseline has memory impairment   Insulin dependent dm2 a1c 7.1 See below  H/o CAD status post CABG and stenting denies any chest pain.  carvedilol statins H/o  paroxysmal atrial fibrillation on Xarelto at home.  H/o pacemaker transitioned to heparin drip due to needing for TEE, held anticoagulation due to acute anemia.   On beta-blockers. H/o Chronic diastolic CHF holding torsemide for now. H/o CKDIV, appear at baseline  Acute  anemia on Anemia of chronic diease: no sign of overt bleeding, however, hgb dropped to 5.4 on 7/25 am, ab distended with reports left lower ab/left groin pain Stat CTab/pel with out contrast, prbc x2units Hold anticoagulation, hold plavix,  Decrease bp meds Keep npo, hold long acting insulin , continue ssi Transfer to stepdown Daughter updated over the phone  Addendum:   CT ab/pel + " Large intramuscular hemorrhage which appears centered in the left iliacus muscle and to a lesser extent in the lower left quadratus lumborum muscle, with extension into the left retroperitoneum"  case discussed with  General surgery on call Dr Ninfa Linden who recommends conservative management and IR consulted. Case discussed with IR Dr Pascal Lux who recommended FFP in addition to management outlined above . He recommended conservative management for now as anticipate bleed will  spontaneous stop. Patient is not a good candidate for CTA angiogram due to CKD. Should patient symptome persists, or become hemodynamically unstable , may consider angiogram to locate active extravasation and possible embolization.  Will check cbc q8hrs   Memory impairment -possible mild baseline dementia, daughter reports patient is being followed by a neurologist  FTT: PT eval, home health once medically stable   Code Status: full  Family Communication: patient , daughter updated daily   Disposition Plan: transfer to stepdown   Consultants:  ID  Cardiology for TEE  Procedures:  TEE on 7/24  Antibiotics:  As above   Objective: BP (!) 113/49   Pulse 60   Temp 99 F (37.2 C) (Oral)   Resp 18  Ht 5' 0.98" (1.549 m)   Wt 62.9 kg   SpO2 100%   BMI 26.22 kg/m   Intake/Output Summary (Last 24 hours) at 05/19/2019 5409 Last data filed at 05/18/2019 1800 Gross per 24 hour  Intake 611.19 ml  Output -  Net 611.19 ml   Filed Weights   05/15/19 1259 05/18/19 0512 05/19/19 0400  Weight: 63.1 kg 63 kg 62.9 kg     Exam: Patient is examined daily including today on 05/19/2019, exams remain the same as of yesterday except that has changed    General:  Appear pale, very hard of hearing, not oriented to the year but to the months and place  Cardiovascular: paced rhythm   Respiratory: CTABL  Abdomen: abdomen distended, tight, left lower quadrant tender, positive BS  Musculoskeletal: No Edema  Neuro: alert, slightly confused about the year  Data Reviewed: Basic Metabolic Panel: Recent Labs  Lab 05/14/19 2052 05/15/19 0500 05/17/19 0914 05/18/19 0434 05/19/19 0511  NA 137 138 142 137 140  K 4.3 4.7 4.1 4.1 4.3  CL 103 105 109 108 112*  CO2 22 21* 22 20* 21*  GLUCOSE 197* 202* 89 139* 76  BUN 59* 58* 52* 46* 54*  CREATININE 2.18* 2.32* 1.89* 1.73* 2.11*  CALCIUM 8.7* 8.1* 8.3* 8.1* 7.7*  MG  --   --   --  2.5*  --    Liver Function Tests: Recent Labs  Lab 05/14/19 2052 05/15/19 0500  AST 30 32  ALT 17 17  ALKPHOS 69 67  BILITOT 0.8 0.8  PROT 7.6 7.2  ALBUMIN 4.1 3.9   No results for input(s): LIPASE, AMYLASE in the last 168 hours. No results for input(s): AMMONIA in the last 168 hours. CBC: Recent Labs  Lab 05/14/19 2052 05/15/19 0500 05/16/19 1027 05/17/19 0914 05/18/19 0434 05/19/19 0511  WBC 7.7 9.8 5.7 5.3 6.2 7.6  NEUTROABS 6.4 8.4*  --   --   --   --   HGB 9.8* 9.7* 9.0* 8.2* 7.4* 5.4*  HCT 30.0* 30.2* 28.4* 25.9* 22.8* 17.2*  MCV 98.7 100.7* 101.8* 99.2 98.7 101.2*  PLT 152 127* 100* 93* 107* 126*   Cardiac Enzymes:   No results for input(s): CKTOTAL, CKMB, CKMBINDEX, TROPONINI in the last 168 hours. BNP (last 3 results) No results for input(s): BNP in the last 8760 hours.  ProBNP (last 3 results) No results for input(s): PROBNP in the last 8760 hours.  CBG: Recent Labs  Lab 05/18/19 1236 05/18/19 1744 05/18/19 2128 05/19/19 0736 05/19/19 0852  GLUCAP 115* 179* 138* 91 85    Recent Results (from the past 240 hour(s))  Blood Culture  (routine x 2)     Status: Abnormal   Collection Time: 05/14/19  8:52 PM   Specimen: BLOOD RIGHT ARM  Result Value Ref Range Status   Specimen Description   Final    BLOOD RIGHT ARM Performed at [facility], [place] [address], [place], [place] [address]    Special Requests   Final    BOTTLES DRAWN AEROBIC AND ANAEROBIC Blood Culture results may not be optimal due to an inadequate volume of blood received in culture bottles Performed at [facility] [address] [place], [place] [address]    Culture  Setup Time   Final    GRAM POSITIVE COCCI IN BOTH AEROBIC AND ANAEROBIC BOTTLES Organism ID to follow CRITICAL RESULT CALLED TO, READ BACK BY AND VERIFIED WITHChristean Grief PHARMD 1814 05/15/19 A  BROWNING    Culture (A)  Final    STAPHYLOCOCCUS SPECIES (COAGULASE NEGATIVE) THE SIGNIFICANCE OF ISOLATING THIS ORGANISM FROM A SINGLE SET OF BLOOD CULTURES WHEN MULTIPLE SETS ARE DRAWN IS UNCERTAIN. PLEASE NOTIFY THE MICROBIOLOGY DEPARTMENT WITHIN ONE WEEK IF SPECIATION AND SENSITIVITIES ARE REQUIRED. Performed at [facility], [place] [address], [place], [place] [address]    Report Status 05/17/2019 FINAL  Final  Urine culture     Status: Abnormal   Collection Time: 05/14/19  8:52 PM   Specimen: In/Out Cath Urine  Result Value Ref Range Status   Specimen Description   Final    IN/OUT CATH URINE Performed at [facility] [address], [place], [place] [address]    Special Requests   Final    NONE Performed at [facility], [place] [address] [place], [place] [address]    Culture (A)  Final    <10,000 COLONIES/mL INSIGNIFICANT GROWTH Performed at [facility] [address] [place], [place] [address]    Report Status 05/15/2019 FINAL  Final  Blood Culture ID Panel (Reflexed)     Status: Abnormal   Collection Time: 05/14/19  8:52 PM  Result Value Ref Range Status   Enterococcus species NOT DETECTED NOT DETECTED Final    Listeria monocytogenes NOT DETECTED NOT DETECTED Final   Staphylococcus species DETECTED (A) NOT DETECTED Final    Comment: Methicillin (oxacillin) resistant coagulase negative staphylococcus. Possible blood culture contaminant (unless isolated from more than one blood culture draw or clinical case suggests pathogenicity). No antibiotic treatment is indicated for blood  culture contaminants. CRITICAL RESULT CALLED TO, READ BACK BY AND VERIFIED WITH: Christean Grief PHARMD 1814 05/15/19 A BROWNING    Staphylococcus aureus (BCID) NOT DETECTED NOT DETECTED Final   Methicillin resistance DETECTED (A) NOT DETECTED Final    Comment: CRITICAL RESULT CALLED TO, READ BACK BY AND VERIFIED WITH: Christean Grief PHARMD 1814 05/15/19 A BROWNING    Streptococcus species DETECTED (A) NOT DETECTED Final    Comment: Not Enterococcus species, Streptococcus agalactiae, Streptococcus pyogenes, or Streptococcus pneumoniae. CRITICAL RESULT CALLED TO, READ BACK BY AND VERIFIED WITH: Christean Grief PHARMD 1814 05/15/19 A BROWNING    Streptococcus agalactiae NOT DETECTED NOT DETECTED Final   Streptococcus pneumoniae NOT DETECTED NOT DETECTED Final   Streptococcus pyogenes NOT DETECTED NOT DETECTED Final   Acinetobacter baumannii NOT DETECTED NOT DETECTED Final   Enterobacteriaceae species NOT DETECTED NOT DETECTED Final   Enterobacter cloacae complex NOT DETECTED NOT DETECTED Final   Escherichia coli NOT DETECTED NOT DETECTED Final   Klebsiella oxytoca NOT DETECTED NOT DETECTED Final   Klebsiella pneumoniae NOT DETECTED NOT DETECTED Final   Proteus species NOT DETECTED NOT DETECTED Final   Serratia marcescens NOT DETECTED NOT DETECTED Final   Haemophilus influenzae NOT DETECTED NOT DETECTED Final   Neisseria meningitidis NOT DETECTED NOT DETECTED Final   Pseudomonas aeruginosa NOT DETECTED NOT DETECTED Final   Candida albicans NOT DETECTED NOT DETECTED Final   Candida glabrata NOT DETECTED NOT DETECTED Final   Candida krusei NOT  DETECTED NOT DETECTED Final   Candida parapsilosis NOT DETECTED NOT DETECTED Final   Candida tropicalis NOT DETECTED NOT DETECTED Final    Comment: Performed at [facility], [place] [address] [place], [place] [address]  SARS Coronavirus 2 (CEPHEID- Performed in [facility] hospital lab), Hosp Order     Status: None   Collection Time: 05/14/19  8:55 PM   Specimen: Nasopharyngeal  Swab  Result Value Ref Range Status   SARS Coronavirus 2 NEGATIVE NEGATIVE Final    Comment: (NOTE) If result is NEGATIVE SARS-CoV-2 target nucleic acids are NOT DETECTED. The SARS-CoV-2 RNA is generally detectable in upper and lower  respiratory specimens during the acute phase of infection. The lowest  concentration of SARS-CoV-2 viral copies this assay can detect is 250  copies / mL. A negative result does not preclude SARS-CoV-2 infection  and should not be used as the sole basis for treatment or other  patient management decisions.  A negative result may occur with  improper specimen collection / handling, submission of specimen other  than nasopharyngeal swab, presence of viral mutation(s) within the  areas targeted by this assay, and inadequate number of viral copies  (<250 copies / mL). A negative result must be combined with clinical  observations, patient history, and epidemiological information. If result is POSITIVE SARS-CoV-2 target nucleic acids are DETECTED. The SARS-CoV-2 RNA is generally detectable in upper and lower  respiratory specimens dur ing the acute phase of infection.  Positive  results are indicative of active infection with SARS-CoV-2.  Clinical  correlation with patient history and other diagnostic information is  necessary to determine patient infection status.  Positive results do  not rule out bacterial infection or co-infection with other viruses. If result is PRESUMPTIVE POSTIVE SARS-CoV-2 nucleic acids MAY BE PRESENT.   A presumptive positive result was obtained on the  submitted specimen  and confirmed on repeat testing.  While 2019 novel coronavirus  (SARS-CoV-2) nucleic acids may be present in the submitted sample  additional confirmatory testing may be necessary for epidemiological  and / or clinical management purposes  to differentiate between  SARS-CoV-2 and other Sarbecovirus currently known to infect humans.  If clinically indicated additional testing with an alternate test  methodology [phone]) is advised. The SARS-CoV-2 RNA is generally  detectable in upper and lower respiratory sp ecimens during the acute  phase of infection. The expected result is Negative. Fact Sheet for Patients:  StrictlyIdeas.no Fact Sheet for Healthcare Providers: BankingDealers.co.za This test is not yet approved or cleared by the [place] FDA and has been authorized for detection and/or diagnosis of SARS-CoV-2 by FDA under an Emergency Use Authorization (EUA).  This EUA will remain in effect (meaning this test can be used) for the duration of the COVID-19 declaration under Section 564(b)(1) of the Act, 21 U.S.C. section 360bbb-3(b)(1), unless the authorization is terminated or revoked sooner. Performed at [facility], [place] [address], [place], [place] [address]   Blood Culture (routine x 2)     Status: None   Collection Time: 05/14/19  8:57 PM   Specimen: BLOOD LEFT ARM  Result Value Ref Range Status   Specimen Description   Final    BLOOD LEFT ARM Performed at [facility], [place] [address] [place], [place] [address]    Special Requests   Final    BOTTLES DRAWN AEROBIC AND ANAEROBIC Blood Culture adequate volume Performed at [facility] [address] [place], [place] [address]    Culture   Final    NO GROWTH 5 DAYS Performed at [facility], [place] [address], [place], [place] [address]    Report Status 05/19/2019 FINAL  Final  Novel  Coronavirus,NAA,(SEND-OUT TO REF LAB - TAT 24-48 hrs); Hosp Order     Status: None   Collection Time: 05/14/19 11:50 PM   Specimen: Nasopharyngeal Swab; Respiratory  Result Value Ref Range Status   SARS-CoV-2, NAA NOT DETECTED NOT DETECTED Final    Comment: (NOTE) This test was developed and its performance characteristics determined by [employer]. This test has not been FDA cleared or approved. This test has been authorized by FDA under an Emergency Use Authorization (EUA). This test is only authorized for the duration of time the declaration that circumstances exist justifying the authorization of the emergency use of in vitro diagnostic tests for detection of SARS-CoV-2 virus and/or diagnosis of COVID-19 infection under section 564(b)(1) of the Act, 21 U.S.C. 710GYI-9(S)(8), unless the authorization is terminated or revoked sooner. When diagnostic testing is negative, the possibility of a false negative result should be considered in the context of a patient's recent exposures and the presence of clinical signs and symptoms consistent with COVID-19. An individual without symptoms of COVID-19 and who is not shedding SARS-CoV-2 virus would expect to have a negative (not detected) result in this assay. Performed  At: [facility] [place], [place] [id number] Rush Farmer MD KX:[id number]    [place]  Final    Comment: Performed at [facility] [address], [place], [place] [address]  Culture, blood (routine x 2)     Status: None (Preliminary result)   Collection Time: 05/16/19  8:08 PM   Specimen: BLOOD RIGHT HAND  Result Value Ref Range Status   Specimen Description   Final    BLOOD RIGHT HAND Performed at [facility] [address] [place], [place] [address]    Special Requests   Final    BOTTLES DRAWN AEROBIC ONLY Blood Culture adequate volume Performed at [facility] [address], [place], [place] [address]    Culture   Final    NO GROWTH 3 DAYS Performed at [facility], [place] [address] [place], [place] [address]    Report Status PENDING  Incomplete  Culture, blood (routine x 2)     Status: None (Preliminary result)   Collection Time: 05/16/19  8:08 PM   Specimen: BLOOD  Result Value Ref Range Status   Specimen Description   Final    BLOOD RIGHT ANTECUBITAL Performed at [facility] [address], [place], [place] [address]    Special Requests   Final    BOTTLES DRAWN AEROBIC ONLY Blood Culture adequate volume Performed at [facility] [address] [place], [place] [address]    Culture   Final    NO GROWTH 3 DAYS Performed at [facility], [place] [address], [place], [place] [address]    Report Status PENDING  Incomplete     Studies: No results found.  Scheduled Meds: . sodium chloride   Intravenous Once  . carvedilol  3.125 mg Oral BID  . ferrous sulfate  325 mg Oral Q breakfast  . insulin aspart  0-9 Units Subcutaneous TID WC  . levothyroxine  88 mcg Oral Q0600  . rosuvastatin  10 mg Oral Daily  . sodium chloride flush  10-40 mL Intracatheter Q12H  . vitamin B-12  100 mcg Oral Daily    Continuous Infusions: . sodium chloride    . vancomycin Stopped (05/18/19 1645)     Time spent: 50mins, I have personally reviewed and interpreted on  05/19/2019 daily labs, tele strips, imagings as discussed above under date review session and assessment and plans.  I reviewed all nursing notes, pharmacy notes, consultant notes,  vitals, pertinent old records  I have discussed plan of care as described above with RN , patient and family on 05/19/2019   Florencia Reasons MD, PhD  [facility] Pager [phone]. If 7PM-7AM, please contact night-coverage at [email or web address], password [facility] 05/19/2019, 9:22 AM  LOS: 4 days

## 2019-05-19 NOTE — Progress Notes (Signed)
ANTICOAGULATION CONSULT NOTE - Follow Up Consult  Pharmacy Consult for heparin Indication: hx atrial fibrillation (home xarelto on hold)  Allergies  Allergen Reactions  . Clonidine Derivatives Other (See Comments)    Bradycardia and fatigue   . Sulfa Antibiotics Other (See Comments)    Unknown  . Crestor [Rosuvastatin Calcium] Other (See Comments)    Other reaction(s): tired and weak  . Epinephrine Other (See Comments)    Abnormal feeling. Dental exam/injection of local w/ epi.  Marland Kitchen Hydralazine Other (See Comments)    Nausea/gi upset   . Losartan Other (See Comments)    Hyperkalemia   . Other Other (See Comments)    MANGO'S - WELTS ALL OVER    Patient Measurements: Height: 5' 0.98" (154.9 cm) Weight: 138 lb 10.7 oz (62.9 kg) IBW/kg (Calculated) : 47.76 Heparin Dosing Weight: 60 kg  Vital Signs: Temp: 99 F (37.2 C) (07/25 0400) Temp Source: Oral (07/25 0400) BP: 102/65 (07/25 0400) Pulse Rate: 63 (07/25 0400)  Labs: Recent Labs    05/16/19 2008 05/17/19 0914 05/18/19 0434 05/19/19 0511  HGB  --  8.2* 7.4* 5.4*  HCT  --  25.9* 22.8* 17.2*  PLT  --  93* 107* 126*  APTT 40* 83* 91*  --   HEPARINUNFRC  --  0.65 0.42 0.21*  CREATININE  --  1.89* 1.73* 2.11*    Estimated Creatinine Clearance: 18.4 mL/min (A) (by C-G formula based on SCr of 2.11 mg/dL (H)).   Medications:  - on xarelto 15 mg daily PTA (last dose taken on 7/20)  Assessment: Patient's a 78 y.o F with hx CKD and  afib on xarelto PTA presented to the ED on 7/20 with c/o generalized weakness, fever and confusion.  Patient was transitioned to heparin drip on admission.  She's now on abx for CoNS sepsis (s/p TEE on 7/23- negative for  Vegetation).  Plan for med line placement and to treat with 2 weeks of abx.  Today, 05/19/2019: - heparin drip is subtherapeutic this morning at 0.21 - hgb decreased noticeably from 7.4 to 5.4; Per pt's RN, no bleeding noted but abd is distended. Heparin drip was d/ced at  0900 this morning by MD. Abd CT showed "Large intramuscular hemorrhage which appears centered in the left iliacus muscle and to a lesser extent in the lower left quadratus lumborum muscle, with extension into the left retroperitoneum" - plts low but stable  Goal of Therapy:  Heparin level 0.3-0.7 units/ml Monitor platelets by anticoagulation protocol: Yes   Plan:  - heparin on hold per MD  ______________________________________________  Dia Sitter P 05/19/2019,7:17 AM

## 2019-05-20 ENCOUNTER — Encounter (HOSPITAL_COMMUNITY): Payer: Self-pay | Admitting: Internal Medicine

## 2019-05-20 DIAGNOSIS — G9341 Metabolic encephalopathy: Secondary | ICD-10-CM | POA: Insufficient documentation

## 2019-05-20 DIAGNOSIS — Z9889 Other specified postprocedural states: Secondary | ICD-10-CM

## 2019-05-20 DIAGNOSIS — D649 Anemia, unspecified: Secondary | ICD-10-CM

## 2019-05-20 DIAGNOSIS — M6289 Other specified disorders of muscle: Secondary | ICD-10-CM | POA: Insufficient documentation

## 2019-05-20 DIAGNOSIS — K661 Hemoperitoneum: Secondary | ICD-10-CM | POA: Diagnosis not present

## 2019-05-20 LAB — BASIC METABOLIC PANEL
Anion gap: 10 (ref 5–15)
Anion gap: 9 (ref 5–15)
BUN: 60 mg/dL — ABNORMAL HIGH (ref 8–23)
BUN: 58 mg/dL — ABNORMAL HIGH (ref 8–23)
CO2: 21 mmol/L — ABNORMAL LOW (ref 22–32)
CO2: 22 mmol/L (ref 22–32)
Calcium: 7.6 mg/dL — ABNORMAL LOW (ref 8.9–10.3)
Calcium: 7.7 mg/dL — ABNORMAL LOW (ref 8.9–10.3)
Chloride: 109 mmol/L (ref 98–111)
Chloride: 106 mmol/L (ref 98–111)
Creatinine, Ser: 2.51 mg/dL — ABNORMAL HIGH (ref 0.44–1.00)
Creatinine, Ser: 2.34 mg/dL — ABNORMAL HIGH (ref 0.44–1.00)
GFR calc Af Amer: 20 mL/min — ABNORMAL LOW (ref >60)
GFR calc Af Amer: 22 mL/min — ABNORMAL LOW (ref >60)
GFR calc non Af Amer: 18 mL/min — ABNORMAL LOW (ref >60)
GFR calc non Af Amer: 19 mL/min — ABNORMAL LOW (ref >60)
Glucose, Bld: 129 mg/dL — ABNORMAL HIGH (ref 70–99)
Glucose, Bld: 207 mg/dL — ABNORMAL HIGH (ref 70–99)
Potassium: 4.4 mmol/L (ref 3.5–5.1)
Potassium: 4.7 mmol/L (ref 3.5–5.1)
Sodium: 140 mmol/L (ref 135–145)
Sodium: 137 mmol/L (ref 135–145)

## 2019-05-20 LAB — CBC
HCT: 22 % — ABNORMAL LOW (ref 36.0–46.0)
HCT: 22.9 % — ABNORMAL LOW (ref 36.0–46.0)
Hemoglobin: 7.3 g/dL — ABNORMAL LOW (ref 12.0–15.0)
Hemoglobin: 7.4 g/dL — ABNORMAL LOW (ref 12.0–15.0)
MCH: 31.2 pg (ref 26.0–34.0)
MCH: 30.7 pg (ref 26.0–34.0)
MCHC: 33.2 g/dL (ref 30.0–36.0)
MCHC: 32.3 g/dL (ref 30.0–36.0)
MCV: 94 fL (ref 80.0–100.0)
MCV: 95 fL (ref 80.0–100.0)
Platelets: 116 10*3/uL — ABNORMAL LOW (ref 150–400)
Platelets: 123 10*3/uL — ABNORMAL LOW (ref 150–400)
RBC: 2.34 MIL/uL — ABNORMAL LOW (ref 3.87–5.11)
RBC: 2.41 MIL/uL — ABNORMAL LOW (ref 3.87–5.11)
RDW: 15.1 % (ref 11.5–15.5)
RDW: 15.1 % (ref 11.5–15.5)
WBC: 8.6 10*3/uL (ref 4.0–10.5)
WBC: 8.9 10*3/uL (ref 4.0–10.5)
nRBC: 0.6 % — ABNORMAL HIGH (ref 0.0–0.2)
nRBC: 0.3 % — ABNORMAL HIGH (ref 0.0–0.2)

## 2019-05-20 LAB — PREPARE FRESH FROZEN PLASMA: Unit division: 0

## 2019-05-20 LAB — VANCOMYCIN, TROUGH: Vancomycin Tr: 14 ug/mL — ABNORMAL LOW (ref 15–20)

## 2019-05-20 LAB — GLUCOSE, CAPILLARY
Glucose-Capillary: 107 mg/dL — ABNORMAL HIGH (ref 70–99)
Glucose-Capillary: 194 mg/dL — ABNORMAL HIGH (ref 70–99)
Glucose-Capillary: 165 mg/dL — ABNORMAL HIGH (ref 70–99)
Glucose-Capillary: 145 mg/dL — ABNORMAL HIGH (ref 70–99)

## 2019-05-20 LAB — BPAM FFP
Blood Product Expiration Date: 202007302359
ISSUE DATE / TIME: 202007251602
Unit Type and Rh: 600

## 2019-05-20 MED ORDER — LIDOCAINE 5 % EX PTCH
2.0000 | MEDICATED_PATCH | CUTANEOUS | Status: DC
  Administered 2019-05-20 – 2019-05-21 (×2): 2 via TRANSDERMAL
  Filled 2019-05-20 (×4): qty 2

## 2019-05-20 MED ORDER — CHLORHEXIDINE GLUCONATE CLOTH 2 % EX PADS
6.0000 | MEDICATED_PAD | Freq: Every day | CUTANEOUS | Status: DC
  Administered 2019-05-21: 6 via TOPICAL

## 2019-05-20 MED ORDER — INSULIN GLARGINE 100 UNIT/ML ~~LOC~~ SOLN
10.0000 [IU] | Freq: Every day | SUBCUTANEOUS | Status: DC
  Administered 2019-05-20 – 2019-05-22 (×3): 10 [IU] via SUBCUTANEOUS
  Filled 2019-05-20 (×3): qty 0.1

## 2019-05-20 MED ORDER — CARVEDILOL 6.25 MG PO TABS: 6.2500 mg | ORAL_TABLET | Freq: Two times a day (BID) | ORAL | Status: DC

## 2019-05-20 MED ORDER — CARVEDILOL 12.5 MG PO TABS
12.5000 mg | ORAL_TABLET | Freq: Two times a day (BID) | ORAL | Status: DC
  Administered 2019-05-20 – 2019-05-21 (×3): 12.5 mg via ORAL
  Filled 2019-05-20 (×3): qty 1

## 2019-05-20 NOTE — Progress Notes (Addendum)
Pharmacy Antibiotic Note  [name] is a 78 y.o. female with hx CKD presented to the ED on 7/20 with c/o generalized weakness, fever and confusion. She's currently on vancomycin for CoNS bacteremia (s/p TEE on 7/23- negative for  Vegetation).  Plan for med line placement and to treat with 2 weeks of abx.   Today, 05/20/2019: - day 5 of abx - ID recommends to treat for 2 weeks - Afebrile, WBC stable WNL - SCr continues to rise - VT within goal range (assuming worsening CrCl)   Plan: - Continue vancomycin 750 mg IV q48h for now - with VT 14 should have some headroom before will need dose adjustment, but will need to f/u SCr closely - Given labile renal function, will hold off on placing OPAT orders until closer to discharge or CrCl stable - Plan for mid line placement ________________________________________  Height: 5' 1.5" (156.2 cm) Weight: 152 lb 8.9 oz (69.2 kg) IBW/kg (Calculated) : 48.95  Temp (24hrs), Avg:98.1 F (36.7 C), Min:97.9 F (36.6 C), Max:98.4 F (36.9 C)  Recent Labs  Lab 05/14/19 2053 05/15/19 0500  05/17/19 0914 05/18/19 0434 05/19/19 0511 05/19/19 0832 05/19/19 1909 05/20/19 0211 05/20/19 0433 05/20/19 0917  WBC  --  9.8   < > 5.3 6.2 7.6 8.9 8.6  --  8.6  --   CREATININE  --  2.32*  --  1.89* 1.73* 2.11*  --   --  2.51*  --   --   LATICACIDVEN 1.1  --   --   --   --   --   --   --   --   --   --   VANCOTROUGH  --   --   --   --   --   --   --   --   --   --  14*   < > = values in this interval not displayed.    Estimated Creatinine Clearance: 16.4 mL/min (A) (by C-G formula based on SCr of 2.51 mg/dL (H)).    Allergies  Allergen Reactions  . Clonidine Derivatives Other (See Comments)    Bradycardia and fatigue   . Sulfa Antibiotics Other (See Comments)    Unknown  . Crestor [Rosuvastatin Calcium] Other (See Comments)    Other reaction(s): tired and weak  . Epinephrine Other (See Comments)    Abnormal feeling. Dental exam/injection of  local w/ epi.  Marland Kitchen Hydralazine Other (See Comments)    Nausea/gi upset   . Losartan Other (See Comments)    Hyperkalemia   . Other Other (See Comments)    MANGO'S - WELTS ALL OVER   Antimicrobials this admission: 7/20 Rocephin>>7/22. Received 2 doses for UTI 7/22 Vancomycin>>  Dose adjustments this admission: 7/24: Vanc 750mg  q48 > AUC 512 with SCr 2.32, with decreased SCr 1.73 > AUC 410 - dose 1gm q48 > AUC 547, will cont 750mg  q48 7/26 VT = 14, continue 750 q48  Microbiology results: 7/20BCx: BCID 7/21 resulted in 1 of 4 bottles with Strep sp, MR-CoNS.  Strep spp never grew on cx, just on BCID 7/20UCx: insignificant growth- final 7/22 rpt BCx x2: ngtd  Thank you for allowing pharmacy to be a part of this patient's care.  Reuel Boom, PharmD, BCPS [phone] 05/20/2019, 10:56 AM

## 2019-05-20 NOTE — Progress Notes (Signed)
[facility]   Reason for visit: Follow up on sepsis  Interval History: in step-down due to significant anemia with Hgb down to 5.4, now s/p transfusion.  CT with large intramuscular hemorrhage centered in the left iliacus muscle.  WBC wnl, no fever.  Vancomycin trough level of 14.  Daughter at bedside.  Day 5 vancomycin  Physical Exam: Constitutional:  Vitals:   05/20/19 0732 05/20/19 1008  BP:  (!) 164/65  Pulse:  70  Resp:    Temp: 98.2 F (36.8 C)   SpO2:     patient appears in NAD Respiratory: Normal respiratory effort; CTA B Cardiovascular: RRR GI: soft, nt, nd  Review of Systems: Constitutional: negative for fevers, chills and anorexia Gastrointestinal: negative for nausea and diarrhea Integument/breast: negative for rash  Lab Results  Component Value Date   WBC 8.6 05/20/2019   HGB 7.3 (L) 05/20/2019   HCT 22.0 (L) 05/20/2019   MCV 94.0 05/20/2019   PLT 116 (L) 05/20/2019    Lab Results  Component Value Date   CREATININE 2.51 (H) 05/20/2019   BUN 60 (H) 05/20/2019   NA 140 05/20/2019   K 4.4 05/20/2019   CL 109 05/20/2019   CO2 21 (L) 05/20/2019    Lab Results  Component Value Date   ALT 17 05/15/2019   AST 32 05/15/2019   ALKPHOS 67 05/15/2019     Microbiology: Recent Results (from the past 240 hour(s))  Blood Culture (routine x 2)     Status: Abnormal   Collection Time: 05/14/19  8:52 PM   Specimen: BLOOD RIGHT ARM  Result Value Ref Range Status   Specimen Description   Final    BLOOD RIGHT ARM Performed at [facility], [address], [place], [place] [address]    Special Requests   Final    BOTTLES DRAWN AEROBIC AND ANAEROBIC Blood Culture results may not be optimal due to an inadequate volume of blood received in culture bottles Performed at [facility] [place] [facility] [address] [place], [place] [address]    Culture  Setup Time   Final    GRAM POSITIVE COCCI IN BOTH AEROBIC AND ANAEROBIC  BOTTLES Organism ID to follow CRITICAL RESULT CALLED TO, READ BACK BY AND VERIFIED WITH: J LEGGE PHARMD [id number] 05/15/19 A BROWNING    Culture (A)  Final    STAPHYLOCOCCUS SPECIES (COAGULASE NEGATIVE) THE SIGNIFICANCE OF ISOLATING THIS ORGANISM FROM A SINGLE SET OF BLOOD CULTURES WHEN MULTIPLE SETS ARE DRAWN IS UNCERTAIN. PLEASE NOTIFY THE MICROBIOLOGY DEPARTMENT WITHIN ONE WEEK IF SPECIATION AND SENSITIVITIES ARE REQUIRED. Performed at [facility], [place] [address], [place], [place] [address]    Report Status 05/17/2019 FINAL  Final  Urine culture     Status: Abnormal   Collection Time: 05/14/19  8:52 PM   Specimen: In/Out Cath Urine  Result Value Ref Range Status   Specimen Description   Final    IN/OUT CATH URINE Performed at [facility] [address] [place], [place] [address]    Special Requests   Final    NONE Performed at [facility], [place] [address] [place], [place] [address]    Culture (A)  Final    <10,000 COLONIES/mL INSIGNIFICANT GROWTH Performed at [place] [address], [place], [place] [address]    Report Status 05/15/2019 FINAL  Final  Blood Culture ID Panel (Reflexed)     Status: Abnormal  Collection Time: 05/14/19  8:52 PM  Result Value Ref Range Status   Enterococcus species NOT DETECTED NOT DETECTED Final   Listeria monocytogenes NOT DETECTED NOT DETECTED Final   Staphylococcus species DETECTED (A) NOT DETECTED Final    Comment: Methicillin (oxacillin) resistant coagulase negative staphylococcus. Possible blood culture contaminant (unless isolated from more than one blood culture draw or clinical case suggests pathogenicity). No antibiotic treatment is indicated for blood  culture contaminants. CRITICAL RESULT CALLED TO, READ BACK BY AND VERIFIED WITH: Christean Grief PHARMD 1814 05/15/19 A BROWNING    Staphylococcus aureus (BCID) NOT DETECTED NOT DETECTED Final   Methicillin resistance DETECTED (A) NOT  DETECTED Final    Comment: CRITICAL RESULT CALLED TO, READ BACK BY AND VERIFIED WITH: Christean Grief PHARMD 1814 05/15/19 A BROWNING    Streptococcus species DETECTED (A) NOT DETECTED Final    Comment: Not Enterococcus species, Streptococcus agalactiae, Streptococcus pyogenes, or Streptococcus pneumoniae. CRITICAL RESULT CALLED TO, READ BACK BY AND VERIFIED WITH: Christean Grief PHARMD 1814 05/15/19 A BROWNING    Streptococcus agalactiae NOT DETECTED NOT DETECTED Final   Streptococcus pneumoniae NOT DETECTED NOT DETECTED Final   Streptococcus pyogenes NOT DETECTED NOT DETECTED Final   Acinetobacter baumannii NOT DETECTED NOT DETECTED Final   Enterobacteriaceae species NOT DETECTED NOT DETECTED Final   Enterobacter cloacae complex NOT DETECTED NOT DETECTED Final   Escherichia coli NOT DETECTED NOT DETECTED Final   Klebsiella oxytoca NOT DETECTED NOT DETECTED Final   Klebsiella pneumoniae NOT DETECTED NOT DETECTED Final   Proteus species NOT DETECTED NOT DETECTED Final   Serratia marcescens NOT DETECTED NOT DETECTED Final   Haemophilus influenzae NOT DETECTED NOT DETECTED Final   Neisseria meningitidis NOT DETECTED NOT DETECTED Final   Pseudomonas aeruginosa NOT DETECTED NOT DETECTED Final   Candida albicans NOT DETECTED NOT DETECTED Final   Candida glabrata NOT DETECTED NOT DETECTED Final   Candida krusei NOT DETECTED NOT DETECTED Final   Candida parapsilosis NOT DETECTED NOT DETECTED Final   Candida tropicalis NOT DETECTED NOT DETECTED Final    Comment: Performed at [facility], [place] [address], [place], [place] [address]  SARS Coronavirus 2 (CEPHEID- Performed in [facility] hospital lab), Hosp Order     Status: None   Collection Time: 05/14/19  8:55 PM   Specimen: Nasopharyngeal Swab  Result Value Ref Range Status   SARS Coronavirus 2 NEGATIVE NEGATIVE Final    Comment: (NOTE) If result is NEGATIVE SARS-CoV-2 target nucleic acids are NOT DETECTED. The SARS-CoV-2 RNA is generally  detectable in upper and lower  respiratory specimens during the acute phase of infection. The lowest  concentration of SARS-CoV-2 viral copies this assay can detect is 250  copies / mL. A negative result does not preclude SARS-CoV-2 infection  and should not be used as the sole basis for treatment or other  patient management decisions.  A negative result may occur with  improper specimen collection / handling, submission of specimen other  than nasopharyngeal swab, presence of viral mutation(s) within the  areas targeted by this assay, and inadequate number of viral copies  (<250 copies / mL). A negative result must be combined with clinical  observations, patient history, and epidemiological information. If result is POSITIVE SARS-CoV-2 target nucleic acids are DETECTED. The SARS-CoV-2 RNA is generally detectable in upper and lower  respiratory specimens dur ing the acute phase of infection.  Positive  results are indicative of active infection with SARS-CoV-2.  Clinical  correlation with patient history  and other diagnostic information is  necessary to determine patient infection status.  Positive results do  not rule out bacterial infection or co-infection with other viruses. If result is PRESUMPTIVE POSTIVE SARS-CoV-2 nucleic acids MAY BE PRESENT.   A presumptive positive result was obtained on the submitted specimen  and confirmed on repeat testing.  While 2019 novel coronavirus  (SARS-CoV-2) nucleic acids may be present in the submitted sample  additional confirmatory testing may be necessary for epidemiological  and / or clinical management purposes  to differentiate between  SARS-CoV-2 and other Sarbecovirus currently known to infect humans.  If clinically indicated additional testing with an alternate test  methodology [phone]) is advised. The SARS-CoV-2 RNA is generally  detectable in upper and lower respiratory sp ecimens during the acute  phase of infection. The  expected result is Negative. Fact Sheet for Patients:  StrictlyIdeas.no Fact Sheet for Healthcare Providers: BankingDealers.co.za This test is not yet approved or cleared by the [place] FDA and has been authorized for detection and/or diagnosis of SARS-CoV-2 by FDA under an Emergency Use Authorization (EUA).  This EUA will remain in effect (meaning this test can be used) for the duration of the COVID-19 declaration under Section 564(b)(1) of the Act, 21 U.S.C. section 360bbb-3(b)(1), unless the authorization is terminated or revoked sooner. Performed at [facility], [place] [address], [place], [place] [address]   Blood Culture (routine x 2)     Status: None   Collection Time: 05/14/19  8:57 PM   Specimen: BLOOD LEFT ARM  Result Value Ref Range Status   Specimen Description   Final    BLOOD LEFT ARM Performed at [facility], [place] [address] [place], [place] [address]    Special Requests   Final    BOTTLES DRAWN AEROBIC AND ANAEROBIC Blood Culture adequate volume Performed at [place] [facility] [address], [place], [place] [address]    Culture   Final    NO GROWTH 5 DAYS Performed at [facility], [place] [address], [place], [place] [address]    Report Status 05/19/2019 FINAL  Final  Novel Coronavirus,NAA,(SEND-OUT TO REF LAB - TAT 24-48 hrs); Hosp Order     Status: None   Collection Time: 05/14/19 11:50 PM   Specimen: Nasopharyngeal Swab; Respiratory  Result Value Ref Range Status   SARS-CoV-2, NAA NOT DETECTED NOT DETECTED Final    Comment: (NOTE) This test was developed and its performance characteristics determined by [employer]. This test has not been FDA cleared or approved. This test has been authorized by FDA under an Emergency Use Authorization (EUA). This test is only authorized for the duration of time the declaration that circumstances exist justifying the  authorization of the emergency use of in vitro diagnostic tests for detection of SARS-CoV-2 virus and/or diagnosis of COVID-19 infection under section 564(b)(1) of the Act, 21 U.S.C. 301SWF-0(X)(3), unless the authorization is terminated or revoked sooner. When diagnostic testing is negative, the possibility of a false negative result should be considered in the context of a patient's recent exposures and the presence of clinical signs and symptoms consistent with COVID-19. An individual without symptoms of COVID-19 and who is not shedding SARS-CoV-2 virus would expect to have a negative (not detected) result in this assay. Performed  At: [facility] [place], [place] [id number] Rush Farmer MD UR:[id number]    [place]  Final    Comment: Performed at [facility], [address] [place], [place] [address]  Culture, blood (routine x 2)     Status: None (Preliminary result)   Collection Time: 05/16/19  8:08 PM   Specimen: BLOOD RIGHT HAND  Result Value Ref Range Status   Specimen Description   Final    BLOOD RIGHT HAND Performed at [place] [address], [place], [place] [address]    Special Requests   Final    BOTTLES DRAWN AEROBIC ONLY Blood Culture adequate volume Performed at [place] [address], [place], [place] [address]    Culture   Final    NO GROWTH 4 DAYS Performed at [facility], [place] [address], [place], [place] [address]    Report Status PENDING  Incomplete  Culture, blood (routine x 2)     Status: None (Preliminary result)   Collection Time: 05/16/19  8:08 PM   Specimen: BLOOD  Result Value Ref Range Status   Specimen Description   Final    BLOOD RIGHT ANTECUBITAL Performed at [facility] [address], [place], [place] [address]    Special Requests   Final    BOTTLES DRAWN AEROBIC ONLY Blood Culture adequate  volume Performed at [facility] [address], [place], [place] [address]    Culture   Final    NO GROWTH 4 DAYS Performed at [facility], [place] [address], [place], [place] [address]    Report Status PENDING  Incomplete    Impression/Plan:  1. CoNS bacteremia with sepsis - clinically stable now, on vancomycin and trough level noted.  Repeat blood cultures 7/22 remain ngtd.    2. Medication monitoring - trough level noted, creat a little up to 2.51 from 2.11 yesterday.    3.  Blood loss - stable hgb now after transfusion.    4. Cardiac device - PM in place and no vegetation noted on TEE.    Dr. Prince Rome back tomorrow

## 2019-05-20 NOTE — Progress Notes (Addendum)
PROGRESS NOTE  [name] ION:[id number] DOB: 06-08-1941 DOA: 05/14/2019 PCP: Leeroy Cha, MD  Brief summary:  Patient Presents with  Fever, acute encephalopathy, found to have  Bacteremia with h/o cat scratch, covid test negative.  iv vanc for two weeks per ID,   Transferred to stepdown due to Acute anemia on 7/25, found to have  intramuscular hemorrhage extending into retroperitoneum , improving on conservative management    HPI/Recap of past 24 hours:  Vital signs are stable, bp elevated She received two units of prbc and one pack ffp yesterday hgb post transfusion was 8, repeat this am is 7.3  Reports left hip pain is improving and lidocaine patch helped  No fever last 24hrs  Daughter at bedside assisting care   Assessment/Plan: Principal Problem:   SIRS (systemic inflammatory response syndrome) (HCC) Active Problems:   Insulin dependent diabetes mellitus (HCC)   Hyperlipidemia LDL goal <70   S/P CABG (coronary artery bypass graft), 12/04/11   Essential hypertension   Acute anemia   CKD (chronic kidney disease) stage 4, GFR 15-29 ml/min (HCC)   Chronic diastolic CHF (congestive heart failure) (HCC)   CAD S/P percutaneous coronary angioplasty   Fever   Acute encephalopathy   Bacteremia due to Gram-positive bacteria   S/P placement of cardiac pacemaker     Acute metabolic encephalopathy on presentation /GPC bacteremia with fever (presenting symptoms), - reports cat scratch at home -covid test negative x2 -With h/o pacemaker , negative TEE -ID recommended two weeks of iv vanc, per ID, zyvox is not a good option due to thrombocytopenia  - vanc level on Sunday (10am and 1pm) -plan to place mid line on Monday as midline can only stay for 6days for van infulsion, -encephalopathy appear has resolved, close to baseline , patient at baseline has memory impairment but aaox3  Insulin dependent dm2 a1c 7.1 On lantus 22units daily at home , decreased to  10units daily due to poor oral intake, continue ssi   CAD status post CABG and stenting denies any chest pain.  carvedilol, statins  Paroxysmal atrial fibrillation ,H/o pacemaker on Xarelto , coreg at home.  Cardiology will see patient on Monday to give recommendation on long term anticoagulation and anticoagulation management, currently both on hold due to intramuscular hemorrhage with extension into the left retroperitoneum.  Chronic diastolic CHF  torsemide held since admission, No sign of volume overload on exam. Cardiology will see on monday  CKDIV, appear at baseline  Acute blood loss anemia on Anemia of chronic diease: prbc x2units, FFPx1 on 7/25 Hold anticoagulation, hold plavix,   CT ab/pel + " Large intramuscular hemorrhage which appears centered in the left iliacus muscle and to a lesser extent in the lower left quadratus lumborum muscle, with extension into the left retroperitoneum"  case discussed with  General surgery on call Dr Ninfa Linden who recommends conservative management and IR consult. Case discussed with IR Dr Pascal Lux who recommended FFP in addition to prbc and holding anticoagulation.  He recommended conservative management for now as anticipate bleed will  spontaneous stop. Patient is not a good candidate for CTA angiogram due to CKD. Should patient symptome persists, or become hemodynamically unstable , may consider angiogram to locate active extravasation and possible embolization.  Monitor cbc    Memory impairment -possible mild baseline dementia, daughter reports patient is being followed by a neurologist  FTT: PT eval, home health once medically stable   Code Status: full  Family Communication: patient , daughter updated daily  Disposition Plan: remain in stepdown, if hgb stable, can possibly discharge later on Monday with midline placement, and clearance by cardiology and ID.   Consultants:  ID  Cardiology   Procedures:  TEE on  7/24  Antibiotics:  As above   Objective: BP (!) 177/57   Pulse 62   Temp 97.9 F (36.6 C) (Oral)   Resp 19   Ht 5' 1.5" (1.562 m)   Wt 69.2 kg   SpO2 100%   BMI 28.36 kg/m   Intake/Output Summary (Last 24 hours) at 05/20/2019 0841 Last data filed at 05/19/2019 1828 Gross per 24 hour  Intake 1116.5 ml  Output -  Net 1116.5 ml   Filed Weights   05/19/19 0400 05/19/19 1000 05/20/19 0500  Weight: 62.9 kg 65 kg 69.2 kg    Exam: Patient is examined daily including today on 05/20/2019, exams remain the same as of yesterday except that has changed    General:  Appear pale, NAD, very hard of hearing, aaox3  Cardiovascular: paced rhythm   Respiratory: CTABL  Abdomen: soft, left flank, left lower quadrant tenderness, positive BS  Musculoskeletal: No Edema  Neuro: AAox3  Data Reviewed: Basic Metabolic Panel: Recent Labs  Lab 05/15/19 0500 05/17/19 0914 05/18/19 0434 05/19/19 0511 05/20/19 0211  NA 138 142 137 140 140  K 4.7 4.1 4.1 4.3 4.4  CL 105 109 108 112* 109  CO2 21* 22 20* 21* 21*  GLUCOSE 202* 89 139* 76 129*  BUN 58* 52* 46* 54* 60*  CREATININE 2.32* 1.89* 1.73* 2.11* 2.51*  CALCIUM 8.1* 8.3* 8.1* 7.7* 7.6*  MG  --   --  2.5*  --   --    Liver Function Tests: Recent Labs  Lab 05/14/19 2052 05/15/19 0500  AST 30 32  ALT 17 17  ALKPHOS 69 67  BILITOT 0.8 0.8  PROT 7.6 7.2  ALBUMIN 4.1 3.9   No results for input(s): LIPASE, AMYLASE in the last 168 hours. No results for input(s): AMMONIA in the last 168 hours. CBC: Recent Labs  Lab 05/14/19 2052 05/15/19 0500  05/18/19 0434 05/19/19 0511 05/19/19 0832 05/19/19 1909 05/20/19 0433  WBC 7.7 9.8   < > 6.2 7.6 8.9 8.6 8.6  NEUTROABS 6.4 8.4*  --   --   --   --   --   --   HGB 9.8* 9.7*   < > 7.4* 5.4* 5.4* 8.0* 7.3*  HCT 30.0* 30.2*   < > 22.8* 17.2* 17.4* 24.4* 22.0*  MCV 98.7 100.7*   < > 98.7 101.2* 99.4 94.2 94.0  PLT 152 127*   < > 107* 126* 141* 116* 116*   < > = values in this  interval not displayed.   Cardiac Enzymes:   No results for input(s): CKTOTAL, CKMB, CKMBINDEX, TROPONINI in the last 168 hours. BNP (last 3 results) No results for input(s): BNP in the last 8760 hours.  ProBNP (last 3 results) No results for input(s): PROBNP in the last 8760 hours.  CBG: Recent Labs  Lab 05/19/19 0852 05/19/19 1253 05/19/19 1627 05/19/19 2137 05/20/19 0802  GLUCAP 85 80 95 104* 107*    Recent Results (from the past 240 hour(s))  Blood Culture (routine x 2)     Status: Abnormal   Collection Time: 05/14/19  8:52 PM   Specimen: BLOOD RIGHT ARM  Result Value Ref Range Status   Specimen Description   Final    BLOOD RIGHT ARM Performed at [facility], [address], [place], [place] [address]    Special Requests   Final    BOTTLES DRAWN AEROBIC AND ANAEROBIC Blood Culture results may not be optimal due to an inadequate volume of blood received in culture bottles Performed at [facility] [address] [place], [place] [address]    Culture  Setup Time   Final    GRAM POSITIVE COCCI IN BOTH AEROBIC AND ANAEROBIC BOTTLES Organism ID to follow CRITICAL RESULT CALLED TO, READ BACK BY AND VERIFIED WITH: J LEGGE PHARMD 1814 05/15/19 A BROWNING    Culture (A)  Final    STAPHYLOCOCCUS SPECIES (COAGULASE NEGATIVE) THE SIGNIFICANCE OF ISOLATING THIS ORGANISM FROM A SINGLE SET OF BLOOD CULTURES WHEN MULTIPLE SETS ARE DRAWN IS UNCERTAIN. PLEASE NOTIFY THE MICROBIOLOGY DEPARTMENT WITHIN ONE WEEK IF SPECIATION AND SENSITIVITIES ARE REQUIRED. Performed at [facility], [place] [address], [place], [place] [address]    Report Status 05/17/2019 FINAL  Final  Urine culture     Status: Abnormal   Collection Time: 05/14/19  8:52 PM   Specimen: In/Out Cath Urine  Result Value Ref Range Status   Specimen Description   Final    IN/OUT CATH URINE Performed at [facility] [address] [place], [place] [address]    Special  Requests   Final    NONE Performed at [facility], [place] [address], [place], [place] [address]    Culture (A)  Final    <10,000 COLONIES/mL INSIGNIFICANT GROWTH Performed at [facility] [address] [place],  [address]    Report Status 05/15/2019 FINAL  Final  Blood Culture ID Panel (Reflexed)     Status: Abnormal   Collection Time: 05/14/19  8:52 PM  Result Value Ref Range Status   Enterococcus species NOT DETECTED NOT DETECTED Final   Listeria monocytogenes NOT DETECTED NOT DETECTED Final   Staphylococcus species DETECTED (A) NOT DETECTED Final    Comment: Methicillin (oxacillin) resistant coagulase negative staphylococcus. Possible blood culture contaminant (unless isolated from more than one blood culture draw or clinical case suggests pathogenicity). No antibiotic treatment is indicated for blood  culture contaminants. CRITICAL RESULT CALLED TO, READ BACK BY AND VERIFIED WITH: Christean Grief PHARMD 1814 05/15/19 A BROWNING    Staphylococcus aureus (BCID) NOT DETECTED NOT DETECTED Final   Methicillin resistance DETECTED (A) NOT DETECTED Final    Comment: CRITICAL RESULT CALLED TO, READ BACK BY AND VERIFIED WITH: Christean Grief PHARMD 1814 05/15/19 A BROWNING    Streptococcus species DETECTED (A) NOT DETECTED Final    Comment: Not Enterococcus species, Streptococcus agalactiae, Streptococcus pyogenes, or Streptococcus pneumoniae. CRITICAL RESULT CALLED TO, READ BACK BY AND VERIFIED WITH: Christean Grief PHARMD 1814 05/15/19 A BROWNING    Streptococcus agalactiae NOT DETECTED NOT DETECTED Final   Streptococcus pneumoniae NOT DETECTED NOT DETECTED Final   Streptococcus pyogenes NOT DETECTED NOT DETECTED Final   Acinetobacter baumannii NOT DETECTED NOT DETECTED Final   Enterobacteriaceae species NOT DETECTED NOT DETECTED Final   Enterobacter cloacae complex NOT DETECTED NOT DETECTED Final   Escherichia coli NOT DETECTED NOT DETECTED Final   Klebsiella oxytoca NOT  DETECTED NOT DETECTED Final   Klebsiella pneumoniae NOT DETECTED NOT DETECTED Final   Proteus species NOT DETECTED NOT DETECTED Final   Serratia marcescens NOT DETECTED NOT DETECTED Final   Haemophilus influenzae NOT DETECTED NOT DETECTED Final   Neisseria meningitidis NOT DETECTED NOT DETECTED Final   Pseudomonas aeruginosa NOT  DETECTED NOT DETECTED Final   Candida albicans NOT DETECTED NOT DETECTED Final   Candida glabrata NOT DETECTED NOT DETECTED Final   Candida krusei NOT DETECTED NOT DETECTED Final   Candida parapsilosis NOT DETECTED NOT DETECTED Final   Candida tropicalis NOT DETECTED NOT DETECTED Final    Comment: Performed at [facility], [place] [address] [place], [place] [address]  SARS Coronavirus 2 (CEPHEID- Performed in [facility] hospital lab), Hosp Order     Status: None   Collection Time: 05/14/19  8:55 PM   Specimen: Nasopharyngeal Swab  Result Value Ref Range Status   SARS Coronavirus 2 NEGATIVE NEGATIVE Final    Comment: (NOTE) If result is NEGATIVE SARS-CoV-2 target nucleic acids are NOT DETECTED. The SARS-CoV-2 RNA is generally detectable in upper and lower  respiratory specimens during the acute phase of infection. The lowest  concentration of SARS-CoV-2 viral copies this assay can detect is 250  copies / mL. A negative result does not preclude SARS-CoV-2 infection  and should not be used as the sole basis for treatment or other  patient management decisions.  A negative result may occur with  improper specimen collection / handling, submission of specimen other  than nasopharyngeal swab, presence of viral mutation(s) within the  areas targeted by this assay, and inadequate number of viral copies  (<250 copies / mL). A negative result must be combined with clinical  observations, patient history, and epidemiological information. If result is POSITIVE SARS-CoV-2 target nucleic acids are DETECTED. The SARS-CoV-2 RNA is generally detectable in upper and  lower  respiratory specimens dur ing the acute phase of infection.  Positive  results are indicative of active infection with SARS-CoV-2.  Clinical  correlation with patient history and other diagnostic information is  necessary to determine patient infection status.  Positive results do  not rule out bacterial infection or co-infection with other viruses. If result is PRESUMPTIVE POSTIVE SARS-CoV-2 nucleic acids MAY BE PRESENT.   A presumptive positive result was obtained on the submitted specimen  and confirmed on repeat testing.  While 2019 novel coronavirus  (SARS-CoV-2) nucleic acids may be present in the submitted sample  additional confirmatory testing may be necessary for epidemiological  and / or clinical management purposes  to differentiate between  SARS-CoV-2 and other Sarbecovirus currently known to infect humans.  If clinically indicated additional testing with an alternate test  methodology [phone]) is advised. The SARS-CoV-2 RNA is generally  detectable in upper and lower respiratory sp ecimens during the acute  phase of infection. The expected result is Negative. Fact Sheet for Patients:  StrictlyIdeas.no Fact Sheet for Healthcare Providers: BankingDealers.co.za This test is not yet approved or cleared by the [place] FDA and has been authorized for detection and/or diagnosis of SARS-CoV-2 by FDA under an Emergency Use Authorization (EUA).  This EUA will remain in effect (meaning this test can be used) for the duration of the COVID-19 declaration under Section 564(b)(1) of the Act, 21 U.S.C. section 360bbb-3(b)(1), unless the authorization is terminated or revoked sooner. Performed at [facility], [place] [address], [place], [place] [address]   Blood Culture (routine x 2)     Status: None   Collection Time: 05/14/19  8:57 PM   Specimen: BLOOD LEFT ARM  Result Value Ref Range Status   Specimen  Description   Final    BLOOD LEFT ARM Performed at [facility], [place] [address] [place],  [address]    Special  Requests   Final    BOTTLES DRAWN AEROBIC AND ANAEROBIC Blood Culture adequate volume Performed at [facility] [address] [place], [place] [address]    Culture   Final    NO GROWTH 5 DAYS Performed at [facility], [place] [address], [place], [place] [address]    Report Status 05/19/2019 FINAL  Final  Novel Coronavirus,NAA,(SEND-OUT TO REF LAB - TAT 24-48 hrs); Hosp Order     Status: None   Collection Time: 05/14/19 11:50 PM   Specimen: Nasopharyngeal Swab; Respiratory  Result Value Ref Range Status   SARS-CoV-2, NAA NOT DETECTED NOT DETECTED Final    Comment: (NOTE) This test was developed and its performance characteristics determined by [employer]. This test has not been FDA cleared or approved. This test has been authorized by FDA under an Emergency Use Authorization (EUA). This test is only authorized for the duration of time the declaration that circumstances exist justifying the authorization of the emergency use of in vitro diagnostic tests for detection of SARS-CoV-2 virus and/or diagnosis of COVID-19 infection under section 564(b)(1) of the Act, 21 U.S.C. 497WYO-3(Z)(8), unless the authorization is terminated or revoked sooner. When diagnostic testing is negative, the possibility of a false negative result should be considered in the context of a patient's recent exposures and the presence of clinical signs and symptoms consistent with COVID-19. An individual without symptoms of COVID-19 and who is not shedding SARS-CoV-2 virus would expect to have a negative (not detected) result in this assay. Performed  At: [facility] [place], [place] [id number] Rush Farmer MD JO:[id number]    [place]  Final    Comment: Performed at [facility] [address], [place], [place] [address]  Culture, blood (routine x 2)     Status: None (Preliminary result)   Collection Time: 05/16/19  8:08 PM   Specimen: BLOOD RIGHT HAND  Result Value Ref Range Status   Specimen Description   Final    BLOOD RIGHT HAND Performed at [facility] [address], [place], [place] [address]    Special Requests   Final    BOTTLES DRAWN AEROBIC ONLY Blood Culture adequate volume Performed at [facility] [address], [place], [place] [address]    Culture   Final    NO GROWTH 4 DAYS Performed at [facility], [place] [address], [place], [place] [address]    Report Status PENDING  Incomplete  Culture, blood (routine x 2)     Status: None (Preliminary result)   Collection Time: 05/16/19  8:08 PM   Specimen: BLOOD  Result Value Ref Range Status   Specimen Description   Final    BLOOD RIGHT ANTECUBITAL Performed at [facility] [address] [place], [place] [address]    Special Requests   Final    BOTTLES DRAWN AEROBIC ONLY Blood Culture adequate volume Performed at [facility] [address], [place], [place] [address]    Culture   Final    NO GROWTH 4 DAYS Performed at [facility], [place] [address], [place], [place] [address]    Report Status PENDING  Incomplete     Studies: Ct Abdomen Pelvis Wo Contrast  Result Date: 05/19/2019 CLINICAL DATA:  78 year old female with history of anemia. EXAM: CT ABDOMEN AND PELVIS WITHOUT CONTRAST TECHNIQUE: Multidetector CT imaging of the abdomen and pelvis was performed following the  standard protocol without IV contrast. COMPARISON:  CT the abdomen and pelvis 06/06/2015. FINDINGS: Lower chest: Trace right and small left pleural effusions. Minimal dependent atelectasis in the left lower lobe. Patchy peribronchovascular consolidation in the right middle lobe. Mild cardiomegaly. Aortic atherosclerosis. Calcified  atherosclerotic plaque in the distal right coronary artery. Pacemaker leads in the right atrium and right ventricle. Hepatobiliary: No definite suspicious cystic or solid hepatic lesions are confidently identified on today's noncontrast CT examination. Calcified granuloma in the right lobe of the liver. Unenhanced appearance of the gallbladder is unremarkable. Pancreas: No definite pancreatic mass or peripancreatic fluid collections or inflammatory changes noted on today's noncontrast CT examination. Spleen: Unremarkable. Adrenals/Urinary Tract: There are 2 intermediate attenuation lesions in the interpolar region of the left kidney measuring up to 1.3 cm posteriorly (axial image 30 of series 2), which are incompletely characterized on today's non-contrast CT examination, but statistically likely proteinaceous/hemorrhagic cysts. Moderate renal atrophy bilaterally. Unenhanced appearance of the adrenal glands is normal. No hydroureteronephrosis. Urinary bladder is unremarkable in appearance. Stomach/Bowel: Unenhanced appearance of the stomach is normal. No pathologic dilatation of small bowel or colon. Normal appendix. Vascular/Lymphatic: Aortic atherosclerosis. No lymphadenopathy noted in the abdomen or pelvis. Reproductive: Status post hysterectomy. Ovaries are not confidently identified may be surgically absent or atrophic. Other: High attenuation fluid throughout the left retroperitoneum, compatible with retroperitoneal hemorrhage. The origin of this is uncertain, however, this likely has originated from the left iliacus muscle (discussed below). The largest retroperitoneal portion of this collection measures 8.2 x 10.7 x 5.4 cm (axial image 41 of series 2 and sagittal image 81 of series 5). Trace volume of ascites. No pneumoperitoneum. Musculoskeletal: The left iliacus muscle appears markedly expanded when compared to the contralateral side, and demonstrates layering fluid fluid level within it (axial image 60 of  series 2). The enlarged left iliacus muscle measures 7.1 x 14.3 x 4.3 cm (axial image 59 of series 2 and sagittal image 80 of series 5), as compared with the contralateral side which is atrophic and difficult to visualize. Some of this hemorrhage also appears to have extended into the left quadratus lumborum muscle which also appears enlarged compared to the contralateral side. There are no aggressive appearing lytic or blastic lesions noted in the visualized portions of the skeleton. IMPRESSION: 1. Large intramuscular hemorrhage which appears centered in the left iliacus muscle and to a lesser extent in the lower left quadratus lumborum muscle, with extension into the left retroperitoneum, as detailed above. 2. Trace right and small left pleural effusions lying dependently. Some associated passive subsegmental atelectasis in the left lower lobe. 3. Patchy peribronchovascular airspace consolidation in the right middle lobe which could suggest a region of bronchopneumonia. 4. Trace volume of ascites. 5. Mild cardiomegaly. 6. Aortic atherosclerosis, in addition to at least right coronary artery disease. Assessment for potential risk factor modification, dietary therapy or pharmacologic therapy may be warranted, if clinically indicated. Electronically Signed   By: Vinnie Langton M.D.   On: 05/19/2019 10:12   Dg Abd 1 View  Result Date: 05/19/2019 CLINICAL DATA:  Abdominal distension. EXAM: ABDOMEN - 1 VIEW COMPARISON:  None. FINDINGS: Nondistended gas-filled loops of colon and small bowel are noted. Stool in the colon is present. No suspicious calcifications are identified. No acute bony abnormalities are identified. IMPRESSION: Nonspecific nonobstructive bowel gas pattern. Electronically Signed   By: Margarette Canada M.D.   On: 05/19/2019 09:41    Scheduled Meds: . carvedilol  12.5 mg Oral BID  . ferrous  sulfate  325 mg Oral Q breakfast  . insulin aspart  0-9 Units Subcutaneous TID WC  . levothyroxine  88 mcg  Oral Q0600  . lidocaine  1 patch Transdermal Q24H  . rosuvastatin  10 mg Oral Daily  . sodium chloride flush  10-40 mL Intracatheter Q12H  . vitamin B-12  100 mcg Oral Daily    Continuous Infusions: . sodium chloride    . vancomycin Stopped (05/18/19 1645)     Time spent: 65mins, I have personally reviewed and interpreted on  05/20/2019 daily labs, tele strips, imagings as discussed above under date review session and assessment and plans.  I reviewed all nursing notes, pharmacy notes, consultant notes,  vitals, pertinent old records  I have discussed plan of care as described above with RN , patient and family on 05/20/2019   Florencia Reasons MD, PhD  [facility] Pager [phone]. If 7PM-7AM, please contact night-coverage at [email or web address], password [facility] 05/20/2019, 8:41 AM  LOS: 5 days

## 2019-05-21 ENCOUNTER — Inpatient Hospital Stay (HOSPITAL_COMMUNITY): Payer: Medicare Other

## 2019-05-21 ENCOUNTER — Inpatient Hospital Stay: Payer: Self-pay

## 2019-05-21 DIAGNOSIS — B957 Other staphylococcus as the cause of diseases classified elsewhere: Secondary | ICD-10-CM | POA: Diagnosis present

## 2019-05-21 DIAGNOSIS — D62 Acute posthemorrhagic anemia: Secondary | ICD-10-CM | POA: Diagnosis not present

## 2019-05-21 DIAGNOSIS — I5032 Chronic diastolic (congestive) heart failure: Secondary | ICD-10-CM

## 2019-05-21 DIAGNOSIS — I495 Sick sinus syndrome: Secondary | ICD-10-CM | POA: Diagnosis present

## 2019-05-21 DIAGNOSIS — D638 Anemia in other chronic diseases classified elsewhere: Secondary | ICD-10-CM | POA: Diagnosis present

## 2019-05-21 DIAGNOSIS — I480 Paroxysmal atrial fibrillation: Secondary | ICD-10-CM | POA: Diagnosis present

## 2019-05-21 DIAGNOSIS — K661 Hemoperitoneum: Secondary | ICD-10-CM

## 2019-05-21 DIAGNOSIS — Z9861 Coronary angioplasty status: Secondary | ICD-10-CM

## 2019-05-21 DIAGNOSIS — N189 Chronic kidney disease, unspecified: Secondary | ICD-10-CM | POA: Diagnosis present

## 2019-05-21 DIAGNOSIS — R7881 Bacteremia: Secondary | ICD-10-CM | POA: Diagnosis present

## 2019-05-21 DIAGNOSIS — D631 Anemia in chronic kidney disease: Secondary | ICD-10-CM | POA: Diagnosis present

## 2019-05-21 DIAGNOSIS — I10 Essential (primary) hypertension: Secondary | ICD-10-CM

## 2019-05-21 LAB — CBC
HCT: 22.2 % — ABNORMAL LOW (ref 36.0–46.0)
HCT: 32.7 % — ABNORMAL LOW (ref 36.0–46.0)
Hemoglobin: 7 g/dL — ABNORMAL LOW (ref 12.0–15.0)
Hemoglobin: 10.6 g/dL — ABNORMAL LOW (ref 12.0–15.0)
MCH: 30.4 pg (ref 26.0–34.0)
MCH: 30.4 pg (ref 26.0–34.0)
MCHC: 31.5 g/dL (ref 30.0–36.0)
MCHC: 32.4 g/dL (ref 30.0–36.0)
MCV: 96.5 fL (ref 80.0–100.0)
MCV: 93.7 fL (ref 80.0–100.0)
Platelets: 132 10*3/uL — ABNORMAL LOW (ref 150–400)
Platelets: 158 10*3/uL (ref 150–400)
RBC: 2.3 MIL/uL — ABNORMAL LOW (ref 3.87–5.11)
RBC: 3.49 MIL/uL — ABNORMAL LOW (ref 3.87–5.11)
RDW: 15 % (ref 11.5–15.5)
RDW: 15 % (ref 11.5–15.5)
WBC: 8.8 10*3/uL (ref 4.0–10.5)
WBC: 9.8 10*3/uL (ref 4.0–10.5)
nRBC: 0.3 % — ABNORMAL HIGH (ref 0.0–0.2)
nRBC: 0.4 % — ABNORMAL HIGH (ref 0.0–0.2)

## 2019-05-21 LAB — BASIC METABOLIC PANEL
Anion gap: 11 (ref 5–15)
BUN: 54 mg/dL — ABNORMAL HIGH (ref 8–23)
CO2: 19 mmol/L — ABNORMAL LOW (ref 22–32)
Calcium: 7.8 mg/dL — ABNORMAL LOW (ref 8.9–10.3)
Chloride: 108 mmol/L (ref 98–111)
Creatinine, Ser: 2.14 mg/dL — ABNORMAL HIGH (ref 0.44–1.00)
GFR calc Af Amer: 25 mL/min — ABNORMAL LOW (ref >60)
GFR calc non Af Amer: 21 mL/min — ABNORMAL LOW (ref >60)
Glucose, Bld: 123 mg/dL — ABNORMAL HIGH (ref 70–99)
Potassium: 4.5 mmol/L (ref 3.5–5.1)
Sodium: 138 mmol/L (ref 135–145)

## 2019-05-21 LAB — GLUCOSE, CAPILLARY
Glucose-Capillary: 104 mg/dL — ABNORMAL HIGH (ref 70–99)
Glucose-Capillary: 155 mg/dL — ABNORMAL HIGH (ref 70–99)
Glucose-Capillary: 139 mg/dL — ABNORMAL HIGH (ref 70–99)
Glucose-Capillary: 171 mg/dL — ABNORMAL HIGH (ref 70–99)

## 2019-05-21 LAB — CULTURE, BLOOD (ROUTINE X 2)
Culture: NO GROWTH
Culture: NO GROWTH
Special Requests: ADEQUATE
Special Requests: ADEQUATE

## 2019-05-21 LAB — MRSA PCR SCREENING: MRSA by PCR: NEGATIVE

## 2019-05-21 LAB — PREPARE RBC (CROSSMATCH)

## 2019-05-21 MED ORDER — SODIUM CHLORIDE 0.9% FLUSH
10.0000 mL | Freq: Two times a day (BID) | INTRAVENOUS | Status: DC
  Administered 2019-05-21 – 2019-05-22 (×2): 10 mL

## 2019-05-21 MED ORDER — MAGNESIUM HYDROXIDE 400 MG/5ML PO SUSP
30.0000 mL | Freq: Two times a day (BID) | ORAL | Status: DC | PRN
  Filled 2019-05-21: qty 30

## 2019-05-21 MED ORDER — SIMETHICONE 80 MG PO CHEW
160.0000 mg | CHEWABLE_TABLET | Freq: Four times a day (QID) | ORAL | Status: DC | PRN
  Administered 2019-05-21 – 2019-05-22 (×2): 160 mg via ORAL
  Filled 2019-05-21 (×2): qty 2

## 2019-05-21 MED ORDER — CARVEDILOL 12.5 MG PO TABS
25.0000 mg | ORAL_TABLET | Freq: Two times a day (BID) | ORAL | Status: DC
  Filled 2019-05-21 (×3): qty 2

## 2019-05-21 MED ORDER — HYDRALAZINE HCL 20 MG/ML IJ SOLN
10.0000 mg | INTRAMUSCULAR | Status: DC | PRN
  Administered 2019-05-21 – 2019-05-22 (×2): 20 mg via INTRAVENOUS
  Filled 2019-05-21 (×2): qty 1

## 2019-05-21 MED ORDER — HYDRALAZINE HCL 20 MG/ML IJ SOLN
INTRAMUSCULAR | Status: AC
  Administered 2019-05-21: 10 mg
  Filled 2019-05-21: qty 1

## 2019-05-21 MED ORDER — SODIUM CHLORIDE 0.9% FLUSH: 10.0000 mL | INTRAVENOUS | Status: DC | PRN

## 2019-05-21 MED ORDER — SODIUM CHLORIDE 0.9% IV SOLUTION
Freq: Once | INTRAVENOUS | Status: AC
  Administered 2019-05-21: 09:00:00 via INTRAVENOUS

## 2019-05-21 MED ORDER — ISOSORBIDE MONONITRATE ER 60 MG PO TB24
30.0000 mg | ORAL_TABLET | Freq: Every day | ORAL | Status: DC
  Administered 2019-05-21 – 2019-05-22 (×2): 30 mg via ORAL
  Filled 2019-05-21 (×2): qty 1

## 2019-05-21 MED ORDER — CHLORHEXIDINE GLUCONATE CLOTH 2 % EX PADS
6.0000 | MEDICATED_PAD | Freq: Every day | CUTANEOUS | Status: DC
  Administered 2019-05-22: 6 via TOPICAL

## 2019-05-21 NOTE — Progress Notes (Signed)
1004 Blood transfusion started  1015 IV site where blood transfusion was infusing began leaking. Infusion paused.   Two attempts were made by charge RN to gain IV access - unsuccessful.   1030- IV team order placed STAT and IV team was notified of needing an IV site for blood transfusion. Blood bank was also notified of having to pause transfusion and stated that as long as blood was transfused under the 4 hour time limit that it would be ok to keep transfusion paused.    Will await IV team to gain access.

## 2019-05-21 NOTE — Progress Notes (Addendum)
PROGRESS NOTE  [name] DDU:[id number] DOB: 03-31-1941 DOA: 05/14/2019 PCP: Leeroy Cha, MD  Brief summary:  Patient Presents with  Fever, acute encephalopathy, found to have  Bacteremia with h/o cat scratch, covid test negative.  iv vanc for two weeks per ID,   Transferred to stepdown due to Acute anemia on 7/25, found to have  intramuscular hemorrhage extending into retroperitoneum , improving on conservative management   Subjective/ Recap of past 24hrs: Patient still feeling weak.  Hb down to 7.0 this am, getting 1 of 2 u prbc's now.  No SOB, CP or fevers.  Wants badly to go home.  Lidocaine patch is helping pain, takin tylenol as well.  Afebrile again, last fevers 4 days ago.     Assessment/Plan: Principal Problem:   Coag negative Staphylococcus bacteremia Active Problems:   Insulin dependent diabetes mellitus (HCC)   Hyperlipidemia LDL goal <70   S/P CABG (coronary artery bypass graft), 12/04/11   Essential hypertension   Acute anemia   CKD (chronic kidney disease) stage 4, GFR 15-29 ml/min (HCC)   Chronic diastolic CHF (congestive heart failure) (HCC)   CAD S/P percutaneous coronary angioplasty   Fever   SIRS (systemic inflammatory response syndrome) (HCC)   Acute encephalopathy   S/P placement of cardiac pacemaker   Acute metabolic encephalopathy   Retroperitoneal hematoma   Muscle hemorrhage   Hospital Course  CoNS bacteremia/ AMS: improving w/ gradual fever reduction on IV vanc - reports cat scratch at home - covid test negative x2 - with h/o pacemaker , negative TEE - ID recommends 2 weeks total IV Vanc (zyvox is not a good option due to thrombocytopenia) - vanc level on Sunday (10am and 1pm) - plan to place PICC line today (see below for CKD issue) - encephalopathy resolved - possible dc 24-48 hrs if intraperitoneal spont bleed resolves, see below  Anemia of ABL w/ acute L retroperitoneal bleed + anemia of chronic diease - Holding  anticoagulation, holding plavix - CT abd/pel > "Large intramuscular hemorrhage which appears centered in the left iliacus muscle and to a lesser extent in the lower left quadratus lumborum muscle, with extension into the left retroperitoneum" - lowest Hb was 5.4 on 5/25, admit Hb 9.7 - got 2u prbc on 5/25 > Hb up to 7.3 yest and down to 7.0 this am - is getting 2 more units prbc's today - check CBC tonight and in am - Case discussed with  General surgery on call Dr Ninfa Linden who recommends conservative management and IR consult - Case discussed with IR Dr Pascal Lux who recommended FFP in addition to prbc and holding anticoagulation.  He recommended conservative management for now as anticipate bleed will spontaneous stop. Patient is not a good candidate for CTA angiogram due to CKD. Should patient symptome persists, or become hemodynamically unstable , may consider angiogram to locate active extravasation and possible embolization  CKD- IV: creat 1.7- 2.5 here, baseline is 1.8- 2.4. stable.  - d/w patient's daughter today w/ regards to PICC line and CKD, daughter states her mother would "never do dialysis" - OK for regular PICC line for IV abx at home  Paroxysmal atrial fibrillation ,H/o pacemaker on Xarelto , coreg at home.  Cardiology will see patient on Monday to give recommendation on long term anticoagulation and anticoagulation management (currently both on hold due to intramuscular hemorrhage with extension into the left retroperitoneum)  Chronic diastolic CHF  torsemide held since admission, No sign of volume overload on exam. Cardiology  consulting today  Insulin dependent dm2 a1c 7.1 On lantus 22units daily at home , decreased to 10units daily due to poor oral intake, continue ssi  CAD status post CABG and stenting denies any chest pain.   - cont carvedilol, statins - resume home Imdur 30 qd  Memory impairment -possible mild baseline dementia, daughter reports patient is being  followed by a neurologist  FTT: PT eval, home health once medically stable   Code Status: full Family Communication: patient , daughter updated in room Diposition Plan: remain in stepdown. If Hb stable possible dc tomorrow. Get Hb after 2nd unit tonight and in am.    Kelly Splinter MD  [facility]  pgr [phone] 05/21/2019, 2:17 PM    Consultants:  ID  Cardiology   Procedures:  TEE on 7/24  Antibiotics:  As above   Objective: BP (!) 125/96   Pulse 63   Temp 98.3 F (36.8 C) (Oral)   Resp (!) 22   Ht 5' 1.5" (1.562 m)   Wt 66.3 kg   SpO2 100%   BMI 27.17 kg/m   Intake/Output Summary (Last 24 hours) at 05/21/2019 1415 Last data filed at 05/21/2019 1337 Gross per 24 hour  Intake 781.67 ml  Output 75 ml  Net 706.67 ml   Filed Weights   05/19/19 1000 05/20/19 0500 05/21/19 0500  Weight: 65 kg 69.2 kg 66.3 kg    Exam: Patient is examined daily including today on 05/21/2019, exams remain the same as of yesterday except that has changed    General:  Appear pale, NAD, very hard of hearing, aaox3  Cardiovascular: paced rhythm   Respiratory: CTABL  Abdomen: soft, left flank, left lower quadrant tenderness, positive BS  Musculoskeletal: No Edema  Neuro: AAox3  Data Reviewed: Basic Metabolic Panel: Recent Labs  Lab 05/18/19 0434 05/19/19 0511 05/20/19 0211 05/20/19 1415 05/21/19 0228  NA 137 140 140 137 138  K 4.1 4.3 4.4 4.7 4.5  CL 108 112* 109 106 108  CO2 20* 21* 21* 22 19*  GLUCOSE 139* 76 129* 207* 123*  BUN 46* 54* 60* 58* 54*  CREATININE 1.73* 2.11* 2.51* 2.34* 2.14*  CALCIUM 8.1* 7.7* 7.6* 7.7* 7.8*  MG 2.5*  --   --   --   --    Liver Function Tests: Recent Labs  Lab 05/14/19 2052 05/15/19 0500  AST 30 32  ALT 17 17  ALKPHOS 69 67  BILITOT 0.8 0.8  PROT 7.6 7.2  ALBUMIN 4.1 3.9   No results for input(s): LIPASE, AMYLASE in the last 168 hours. No results for input(s): AMMONIA in the last 168 hours. CBC: Recent Labs  Lab  05/14/19 2052 05/15/19 0500  05/19/19 0832 05/19/19 1909 05/20/19 0433 05/20/19 1415 05/21/19 0228  WBC 7.7 9.8   < > 8.9 8.6 8.6 8.9 8.8  NEUTROABS 6.4 8.4*  --   --   --   --   --   --   HGB 9.8* 9.7*   < > 5.4* 8.0* 7.3* 7.4* 7.0*  HCT 30.0* 30.2*   < > 17.4* 24.4* 22.0* 22.9* 22.2*  MCV 98.7 100.7*   < > 99.4 94.2 94.0 95.0 96.5  PLT 152 127*   < > 141* 116* 116* 123* 132*   < > = values in this interval not displayed.   Cardiac Enzymes:   No results for input(s): CKTOTAL, CKMB, CKMBINDEX, TROPONINI in the last 168 hours. BNP (last 3 results) No results for input(s): BNP in the  last 8760 hours.  ProBNP (last 3 results) No results for input(s): PROBNP in the last 8760 hours.  CBG: Recent Labs  Lab 05/20/19 1113 05/20/19 1651 05/20/19 2125 05/21/19 0802 05/21/19 1214  GLUCAP 194* 165* 145* 104* 155*    Recent Results (from the past 240 hour(s))  Blood Culture (routine x 2)     Status: Abnormal   Collection Time: 05/14/19  8:52 PM   Specimen: BLOOD RIGHT ARM  Result Value Ref Range Status   Specimen Description   Final    BLOOD RIGHT ARM Performed at [facility], [place] [address], [place], [place] [address]    Special Requests   Final    BOTTLES DRAWN AEROBIC AND ANAEROBIC Blood Culture results may not be optimal due to an inadequate volume of blood received in culture bottles Performed at [place] [address] [place], [place] [address]    Culture  Setup Time   Final    GRAM POSITIVE COCCI IN BOTH AEROBIC AND ANAEROBIC BOTTLES Organism ID to follow CRITICAL RESULT CALLED TO, READ BACK BY AND VERIFIED WITH: J LEGGE PHARMD 1814 05/15/19 A BROWNING    Culture (A)  Final    STAPHYLOCOCCUS SPECIES (COAGULASE NEGATIVE) THE SIGNIFICANCE OF ISOLATING THIS ORGANISM FROM A SINGLE SET OF BLOOD CULTURES WHEN MULTIPLE SETS ARE DRAWN IS UNCERTAIN. PLEASE NOTIFY THE MICROBIOLOGY DEPARTMENT WITHIN ONE WEEK IF SPECIATION AND SENSITIVITIES ARE  REQUIRED. Performed at [facility], [place] [address], [place], [place] [address]    Report Status 05/17/2019 FINAL  Final  Urine culture     Status: Abnormal   Collection Time: 05/14/19  8:52 PM   Specimen: In/Out Cath Urine  Result Value Ref Range Status   Specimen Description   Final    IN/OUT CATH URINE Performed at [place] [address] [place], [place] [address]    Special Requests   Final    NONE Performed at [facility], [place] [address] [place], [place] [address]    Culture (A)  Final    <10,000 COLONIES/mL INSIGNIFICANT GROWTH Performed at [place] [address], [place], [place] [address]    Report Status 05/15/2019 FINAL  Final  Blood Culture ID Panel (Reflexed)     Status: Abnormal   Collection Time: 05/14/19  8:52 PM  Result Value Ref Range Status   Enterococcus species NOT DETECTED NOT DETECTED Final   Listeria monocytogenes NOT DETECTED NOT DETECTED Final   Staphylococcus species DETECTED (A) NOT DETECTED Final    Comment: Methicillin (oxacillin) resistant coagulase negative staphylococcus. Possible blood culture contaminant (unless isolated from more than one blood culture draw or clinical case suggests pathogenicity). No antibiotic treatment is indicated for blood  culture contaminants. CRITICAL RESULT CALLED TO, READ BACK BY AND VERIFIED WITH: Christean Grief PHARMD 1814 05/15/19 A BROWNING    Staphylococcus aureus (BCID) NOT DETECTED NOT DETECTED Final   Methicillin resistance DETECTED (A) NOT DETECTED Final    Comment: CRITICAL RESULT CALLED TO, READ BACK BY AND VERIFIED WITH: Christean Grief PHARMD 1814 05/15/19 A BROWNING    Streptococcus species DETECTED (A) NOT DETECTED Final    Comment: Not Enterococcus species, Streptococcus agalactiae, Streptococcus pyogenes, or Streptococcus pneumoniae. CRITICAL RESULT CALLED TO, READ BACK BY AND VERIFIED WITH: J LEGGE PHARMD 1814 05/15/19 A BROWNING    Streptococcus  agalactiae NOT DETECTED NOT DETECTED Final   Streptococcus pneumoniae NOT DETECTED NOT DETECTED Final   Streptococcus pyogenes  NOT DETECTED NOT DETECTED Final   Acinetobacter baumannii NOT DETECTED NOT DETECTED Final   Enterobacteriaceae species NOT DETECTED NOT DETECTED Final   Enterobacter cloacae complex NOT DETECTED NOT DETECTED Final   Escherichia coli NOT DETECTED NOT DETECTED Final   Klebsiella oxytoca NOT DETECTED NOT DETECTED Final   Klebsiella pneumoniae NOT DETECTED NOT DETECTED Final   Proteus species NOT DETECTED NOT DETECTED Final   Serratia marcescens NOT DETECTED NOT DETECTED Final   Haemophilus influenzae NOT DETECTED NOT DETECTED Final   Neisseria meningitidis NOT DETECTED NOT DETECTED Final   Pseudomonas aeruginosa NOT DETECTED NOT DETECTED Final   Candida albicans NOT DETECTED NOT DETECTED Final   Candida glabrata NOT DETECTED NOT DETECTED Final   Candida krusei NOT DETECTED NOT DETECTED Final   Candida parapsilosis NOT DETECTED NOT DETECTED Final   Candida tropicalis NOT DETECTED NOT DETECTED Final    Comment: Performed at [facility], [place] [address] [place], [place] [address]  SARS Coronavirus 2 (CEPHEID- Performed in [facility] hospital lab), Hosp Order     Status: None   Collection Time: 05/14/19  8:55 PM   Specimen: Nasopharyngeal Swab  Result Value Ref Range Status   SARS Coronavirus 2 NEGATIVE NEGATIVE Final    Comment: (NOTE) If result is NEGATIVE SARS-CoV-2 target nucleic acids are NOT DETECTED. The SARS-CoV-2 RNA is generally detectable in upper and lower  respiratory specimens during the acute phase of infection. The lowest  concentration of SARS-CoV-2 viral copies this assay can detect is 250  copies / mL. A negative result does not preclude SARS-CoV-2 infection  and should not be used as the sole basis for treatment or other  patient management decisions.  A negative result may occur with  improper specimen collection / handling, submission  of specimen other  than nasopharyngeal swab, presence of viral mutation(s) within the  areas targeted by this assay, and inadequate number of viral copies  (<250 copies / mL). A negative result must be combined with clinical  observations, patient history, and epidemiological information. If result is POSITIVE SARS-CoV-2 target nucleic acids are DETECTED. The SARS-CoV-2 RNA is generally detectable in upper and lower  respiratory specimens dur ing the acute phase of infection.  Positive  results are indicative of active infection with SARS-CoV-2.  Clinical  correlation with patient history and other diagnostic information is  necessary to determine patient infection status.  Positive results do  not rule out bacterial infection or co-infection with other viruses. If result is PRESUMPTIVE POSTIVE SARS-CoV-2 nucleic acids MAY BE PRESENT.   A presumptive positive result was obtained on the submitted specimen  and confirmed on repeat testing.  While 2019 novel coronavirus  (SARS-CoV-2) nucleic acids may be present in the submitted sample  additional confirmatory testing may be necessary for epidemiological  and / or clinical management purposes  to differentiate between  SARS-CoV-2 and other Sarbecovirus currently known to infect humans.  If clinically indicated additional testing with an alternate test  methodology [phone]) is advised. The SARS-CoV-2 RNA is generally  detectable in upper and lower respiratory sp ecimens during the acute  phase of infection. The expected result is Negative. Fact Sheet for Patients:  StrictlyIdeas.no Fact Sheet for Healthcare Providers: BankingDealers.co.za This test is not yet approved or cleared by the [place] FDA and has been authorized for detection and/or diagnosis of SARS-CoV-2 by FDA under an Emergency Use Authorization (EUA).  This EUA will remain in effect (meaning this test can be used) for  the duration of the COVID-19 declaration under Section 564(b)(1) of the Act, 21 U.S.C. section 360bbb-3(b)(1), unless the authorization is terminated or revoked sooner. Performed at [facility], [place] [address], [place], [place] [address]   Blood Culture (routine x 2)     Status: None   Collection Time: 05/14/19  8:57 PM   Specimen: BLOOD LEFT ARM  Result Value Ref Range Status   Specimen Description   Final    BLOOD LEFT ARM Performed at [facility], [place] [address], [place], [place] [address]    Special Requests   Final    BOTTLES DRAWN AEROBIC AND ANAEROBIC Blood Culture adequate volume Performed at [place] [address], [place], [place] [address]    Culture   Final    NO GROWTH 5 DAYS Performed at [facility], [place] [address] [place], [place] [address]    Report Status 05/19/2019 FINAL  Final  Novel Coronavirus,NAA,(SEND-OUT TO REF LAB - TAT 24-48 hrs); Hosp Order     Status: None   Collection Time: 05/14/19 11:50 PM   Specimen: Nasopharyngeal Swab; Respiratory  Result Value Ref Range Status   SARS-CoV-2, NAA NOT DETECTED NOT DETECTED Final    Comment: (NOTE) This test was developed and its performance characteristics determined by [employer]. This test has not been FDA cleared or approved. This test has been authorized by FDA under an Emergency Use Authorization (EUA). This test is only authorized for the duration of time the declaration that circumstances exist justifying the authorization of the emergency use of in vitro diagnostic tests for detection of SARS-CoV-2 virus and/or diagnosis of COVID-19 infection under section 564(b)(1) of the Act, 21 U.S.C. 846NGE-9(B)(2), unless the authorization is terminated or revoked sooner. When diagnostic testing is negative, the possibility of a false negative result should be considered in the context of a patient's recent exposures and the presence of clinical  signs and symptoms consistent with COVID-19. An individual without symptoms of COVID-19 and who is not shedding SARS-CoV-2 virus would expect to have a negative (not detected) result in this assay. Performed  At: [facility] [place], [place] [id number] Rush Farmer MD UU:[id number]    [place]  Final    Comment: Performed at [place] [address] [place], [place] [address]  Culture, blood (routine x 2)     Status: None   Collection Time: 05/16/19  8:08 PM   Specimen: BLOOD RIGHT HAND  Result Value Ref Range Status   Specimen Description   Final    BLOOD RIGHT HAND Performed at [place] [address] [place], [place] [address]    Special Requests   Final    BOTTLES DRAWN AEROBIC ONLY Blood Culture adequate volume Performed at [place] [address] [place], [place] [address]    Culture   Final    NO GROWTH 5 DAYS Performed at [facility], [place] [address], [place], [place] [address]    Report Status 05/21/2019 FINAL  Final  Culture, blood (routine x 2)     Status: None   Collection Time: 05/16/19  8:08 PM   Specimen: BLOOD  Result Value Ref Range Status   Specimen Description   Final    BLOOD RIGHT ANTECUBITAL Performed at [place] [address] [place],  [address]    Special Requests   Final    BOTTLES DRAWN  AEROBIC ONLY Blood Culture adequate volume Performed at [place] [address], [place], [place] [address]    Culture   Final    NO GROWTH 5 DAYS Performed at [facility], [place] [address] [place], [place] [address]    Report Status 05/21/2019 FINAL  Final  MRSA PCR Screening     Status: None   Collection Time: 05/20/19 10:27 PM   Specimen: Nasal Mucosa; Nasopharyngeal  Result Value Ref Range Status   MRSA by PCR NEGATIVE NEGATIVE Final    Comment:        The  GeneXpert MRSA Assay (FDA approved for NASAL specimens only), is one component of a comprehensive MRSA colonization surveillance program. It is not intended to diagnose MRSA infection nor to guide or monitor treatment for MRSA infections. Performed at [facility], [place] [address], [place], [place] [address]      Studies: [place] Ekg Site Rite  Result Date: 05/21/2019 If [facility] image not attached, placement could not be confirmed due to current cardiac rhythm.   Scheduled Meds: . carvedilol  12.5 mg Oral BID  . Chlorhexidine Gluconate Cloth  6 each Topical Daily  . ferrous sulfate  325 mg Oral Q breakfast  . insulin aspart  0-9 Units Subcutaneous TID WC  . insulin glargine  10 Units Subcutaneous Daily  . levothyroxine  88 mcg Oral Q0600  . lidocaine  2 patch Transdermal Q24H  . rosuvastatin  10 mg Oral Daily  . sodium chloride flush  10-40 mL Intracatheter Q12H  . vitamin B-12  100 mcg Oral Daily    Continuous Infusions: . sodium chloride    . vancomycin Stopped (05/20/19 1138)    If 7PM-7AM, please contact night-coverage at [email or web address], password [facility] 05/21/2019, 2:15 PM  LOS: 6 days

## 2019-05-21 NOTE — Progress Notes (Signed)
Peripherally Inserted Central Catheter/Midline Placement  The IV Nurse has discussed with the patient and/or persons authorized to consent for the patient, the purpose of this procedure and the potential benefits and risks involved with this procedure.  The benefits include less needle sticks, lab draws from the catheter, and the patient may be discharged home with the catheter. Risks include, but not limited to, infection, bleeding, blood clot (thrombus formation), and puncture of an artery; nerve damage and irregular heartbeat and possibility to perform a PICC exchange if needed/ordered by physician.  Alternatives to this procedure were also discussed.  Bard Power PICC patient education guide, fact sheet on infection prevention and patient information card has been provided to patient /or left at bedside.    PICC/Midline Placement Documentation  PICC Single Lumen 23/30/07 PICC Right Basilic 39 cm 0 cm (Active)  Indication for Insertion or Continuance of Line Home intravenous therapies (PICC only) 05/21/19 1700  Exposed Catheter (cm) 0 cm 05/21/19 1700  Site Assessment Clean;Dry;Intact 05/21/19 1700  Line Status Flushed;Saline locked;Blood return noted 05/21/19 1700  Dressing Type Transparent;Securing device 05/21/19 1700  Dressing Status Clean;Dry;Intact;Antimicrobial disc in place 05/21/19 [place]annie checked and tightened 05/21/19 1700  Dressing Intervention New dressing 05/21/19 1700  Dressing Change Due 05/28/19 05/21/19 1700       Virgilio Belling 05/21/2019, 6:34 PM

## 2019-05-21 NOTE — Consult Note (Addendum)
Cardiology Consultation:   Patient ID: [name] MRN: [id number]; DOB: 08-10-1941  Admit date: 05/14/2019 Date of Consult: 05/21/2019  Primary Care Provider: Leeroy Cha, MD Primary Cardiologist: [name], MD  Primary Electrophysiologist:  Constance Haw, MD    Patient Profile:   [name] is a 78 y.o. female with a hx of CAD s/p CABG 2013 and stenting, paroxysmal atrial fibrillation on Xarelto, essential hypertension, upper lipidemia, hypothyroidism, IDDM, CKD stage IV, chronic CHF, tachybradycardia syndrome s/p [employer] permanent pacemaker 2017, chronic anemia who is being seen today for the evaluation of anticoagulation management at the request of Dr. Jonnie Finner.  History of Present Illness:   [name] has past medical history as above.  Her last cardiac catheterization in December 2019 revealed a 95% ostial stenosis in the SVG to OM.  Given her CKD, stage PCI was recommended.  She ultimately underwent PCI of venous graft in December 2019.  Immediately afterward, she had improvement of chest discomfort.  She did develop recurrent chest discomfort and was placed on Imdur, however, this may her feel bad so she stopped it. Her chest discomfort resolved.  She was admitted to the hospital on 05/14/2019 with generalized weakness, fever and confusion.  She was noted to have had a cat scratch.  COVID testing was negative.  She has been treated for SIRS with IV antibiotics, management per infectious disease.  TEE done on 7/23 was negative for vegetation.  On 7/25 she developed an intramuscular hemorrhage extending into the retroperitoneum.  She was transferred to stepdown and transfused with 2 units of PRBC and 1 unit FFP.  Cardiology has been asked to consult for recommendations on long-term anticoagulation which is now on hold, as well as Plavix, due to intramuscular hemorrhage with extension into the left retroperitoneum.  Per review of notes the patient has had no  signs of volume overload.  Her torsemide has been held since admission.  Today, on exam, pt is very hard of hearing. Her daughter is with her and helps answer questions. Pt denies any chest pain/pressure, shortness of breath or palpitations. Her leg pain is improving with lidocaine patch. She has not been getting her torsemide, but she looks euvolemic. No peripheral edema.   Heart Pathway Score:     Past Medical History:  Diagnosis Date  . Anemia   . Anxiety   . Arthritis    "in my hands; knees, back" (09/27/2018)  . CAD (coronary artery disease)    a. 40-59% bilaterally 10/2015.  Marland Kitchen Chronic diastolic CHF (congestive heart failure) ([place])    a. 05/2016 Echo: EF 60-65%, no rwma, Gr1 DD, Ao sclerosis w/o stenosis, triv MR;  b. 07/2016 TEE: EF 55-60%, no rwma, mild MR.  . CKD (chronic kidney disease), stage III ([place])   . Coronary artery disease    a. 02/2007 Persantine MV: low risk;  b. 11/2011 CABG x 3 (LIMA->LAD, VG->OM, VG->RCA);  c. 05/2016 MV: EF >65%, no isch/infarct, horiz ST dep in I, II, V5-V6.  Marland Kitchen Depression   . Diverticulosis   . Esophageal stricture   . GERD (gastroesophageal reflux disease)   . Hemorrhoids   . Hiatal hernia   . Hyperkalemia    a. ARB stopped due to this.  . Hyperlipidemia   . Hypertension   . Hypertensive heart disease   . Hypothyroidism   . Mild cognitive impairment    a. seen by neurology.  . Myocardial infarction ([place]) 12/07/2011  . PAF (paroxysmal atrial fibrillation) (  [place])    a. post-op CABG.  . Pain    RIGHT KNEE PAIN - TORN RIGHT MEDIAL MENISCUS  . Paroxysmal atrial flutter ([place])    a. 07/2016 s/p TEE & DCCV;  b. 07/2016 Recurrent PAFlutter req initiation of amio & PPM in setting of tachy-brady;  c. CHA2DS2VASc = 7-->Xarelto 15 mg QD.  Marland Kitchen Pneumonia    "twice" (09/27/2018)  . PONV (postoperative nausea and vomiting)   . Presence of permanent cardiac pacemaker 08/12/2016  . S/P CABG (coronary artery bypass graft), 12/04/11 12/07/2011   LIMA to LAD, SVG  to OM, SVG to RCA  . Sinus bradycardia    a. not on BB due to this.  . Skin cancer    "face" (09/27/2018)  . Tachy-brady syndrome ([place])    a. 07/2016 Jxnl brady following DCCV, recurrent Aflutter-->amio + SJM 2272 Assurity MRI DC PPM (ser # [id number]).  . Type II diabetes mellitus ([place])     Past Surgical History:  Procedure Laterality Date  . ABDOMINAL HYSTERECTOMY  1980's  . ANKLE FRACTURE SURGERY Right    "put pins both side right ankle"  . BACK SURGERY  2006   "cyst growing near my spine"  . CARDIAC CATHETERIZATION  12/02/2011   mild LV dysfunction with mod hypocontractility of mid-distal anterolateral wall; CAD w/ostial tapering of L [place] with 50% diffuse ostial narrowing of LAD, 99% eccentric focal prox LAD stenosis followed by 70% prox LAD stenosis after 1st diag, 20% mid LAD narrowing; 80% ostial-to-prox L Cfx stenosis & 40-50% irregularity of RCA (Dr. Corky Downs)  . CARDIOVERSION N/A 08/11/2016   Procedure: CARDIOVERSION;  Surgeon: Lelon Perla, MD;  Location: [facility] ENDOSCOPY;  Service: Cardiovascular;  Laterality: N/A;  . CATARACT EXTRACTION W/ INTRAOCULAR LENS  IMPLANT, BILATERAL Bilateral ~ 2010  . [place]  . CORONARY ARTERY BYPASS GRAFT  12/04/2011   Procedure: CORONARY ARTERY BYPASS GRAFTING (CABG);  Surgeon: Tharon Aquas Adelene Idler, MD;  Location: [place];  Service: Open Heart Surgery;  Laterality: N/A;  CABG x three,  using left internal mammary artery, and right leg greater saphenous vein harvested endoscopically  . CORONARY STENT INTERVENTION N/A 09/27/2018   Procedure: CORONARY STENT INTERVENTION;  Surgeon: Troy Sine, MD;  Location: [place] CV LAB;  Service: Cardiovascular;  Laterality: N/A;  . DILATION AND CURETTAGE OF UTERUS     "a couple times"  . EP IMPLANTABLE DEVICE N/A 08/12/2016   Procedure: Pacemaker Implant;  Surgeon: Will Meredith Leeds, MD;  Location: [place] CV LAB;  Service: Cardiovascular;  Laterality: N/A;  . ESOPHAGOGASTRODUODENOSCOPY  (EGD) WITH ESOPHAGEAL DILATION    . FRACTURE SURGERY    . JOINT REPLACEMENT    . KNEE ARTHROSCOPY WITH MEDIAL MENISECTOMY Right 07/02/2014   Procedure: RIGHT KNEE ARTHROSCOPY WITH PARTIAL MEDIAL MENISTECTOMY, ABRASION CONDROPLASTYU OF PATELLA,ABRASION CONDROPLASTY OF MEDIAL FEMEROL CONDYL, MICROFRACTURE OF MEDIAL FEMEROL CONDYL;  Surgeon: Tobi Bastos, MD;  Location: [facility] ORS;  Service: Orthopedics;  Laterality: Right;  . LEFT HEART CATH AND CORS/GRAFTS ANGIOGRAPHY N/A 09/06/2018   Procedure: LEFT HEART CATH AND CORS/GRAFTS ANGIOGRAPHY;  Surgeon: Troy Sine, MD;  Location: [place] CV LAB;  Service: Cardiovascular;  Laterality: N/A;  . LEFT HEART CATHETERIZATION WITH CORONARY ANGIOGRAM N/A 12/02/2011   Procedure: LEFT HEART CATHETERIZATION WITH CORONARY ANGIOGRAM;  Surgeon: Troy Sine, MD;  Location[facility] C[facility] LAB;  Service: Cardiovascular;  Laterality: N/A;  Coronary angiogram, possible PCI  . TEE WITHOUT CARDIOVERSION N/A 08/11/2016   Procedure:  TRANSESOPHAGEAL ECHOCARDIOGRAM (TEE);  Surgeon: Lelon Perla, MD;  Location[facility] ENDOSCOPY;  Service: Cardiovascular;  Laterality: N/A;  . TEE WITHOUT CARDIOVERSION N/A 05/18/2019   Procedure: TRANSESOPHAGEAL ECHOCARDIOGRAM (TEE);  Surgeon: [name], MD;  Location: [place];  Service: Cardiovascular;  Laterality: N/A;  . TONSILLECTOMY  1949  . TOTAL KNEE ARTHROPLASTY Left ~ 2006  . TRANSTHORACIC ECHOCARDIOGRAM  02/19/2013   EF 21-30%, grade 1 diastolic dysfunction; mildly thickend/calcified AV leaflets; mildly calcidied MV annulus; mild TR     Home Medications:  Prior to Admission medications   Medication Sig Start Date End Date Taking? Authorizing Provider  acetaminophen (TYLENOL) 650 MG CR tablet Take 650 mg by mouth every 8 (eight) hours as needed for pain.   Yes [provider]  Ascorbic Acid (VITAMIN C PO) Take 1 tablet by mouth 3 (three) times a week.   Yes [provider]  carvedilol (COREG) 12.5 MG  tablet Take 1 tablet (12.5 mg total) by mouth 2 (two) times daily. 02/13/19 05/14/19 Yes Camnitz, Will Hassell Done, MD  clopidogrel (PLAVIX) 75 MG tablet Take 1 tablet (75 mg total) by mouth daily. 09/06/18 09/06/19 Yes Kathyrn Drown D, NP  Cyanocobalamin (VITAMIN B-12 PO) Take 1 tablet by mouth 3 (three) times a week.    Yes [provider]  docusate sodium (COLACE) 100 MG capsule Take 100 mg by mouth 2 (two) times daily as needed for mild constipation or moderate constipation.   Yes [provider]  ferrous sulfate 325 (65 FE) MG EC tablet Take 325 mg by mouth daily with breakfast.   Yes [provider]  gabapentin (NEURONTIN) 300 MG capsule Take 300 mg by mouth 2 (two) times daily.    Yes [provider]  LANTUS SOLOSTAR 100 UNIT/ML Solostar Pen Inject 22 Units into the skin daily. 08/08/18  Yes [provider]  levothyroxine (SYNTHROID) 88 MCG tablet Take 88 mcg by mouth daily before breakfast.    Yes [provider]  meclizine (ANTIVERT) 25 MG tablet Take 25 mg by mouth daily as needed for dizziness. For dizziness   Yes [provider]  Multiple Vitamins-Minerals (PRESERVISION AREDS 2 PO) Take 1 tablet by mouth every evening.    Yes [provider]  nitroGLYCERIN (NITROSTAT) 0.4 MG SL tablet Place 1 tablet (0.4 mg total) under the tongue every 5 (five) minutes as needed for chest pain. 08/15/18 05/14/19 Yes Lendon Colonel, NP  Polyethyl Glycol-Propyl Glycol (SYSTANE OP) Place 1 drop into both eyes 2 (two) times daily.    Yes [provider]  Rosuvastatin Calcium 10 MG CPSP Take 10 mg by mouth daily.    Yes [provider]  torsemide (DEMADEX) 20 MG tablet Take 40 mg by mouth every morning. 12/20/17  Yes [provider]  XARELTO 15 MG TABS tablet TAKE 1 TABLET BY MOUTH ONCE DAILY WITH SUPPER Patient taking differently: Take 15 mg by mouth daily.  03/14/19  Yes Hilty, Nadean Corwin, MD  esomeprazole (NEXIUM)  20 MG capsule TAKE 1 CAPSULE BY MOUTH ONCE DAILY AT  12  NOON Patient not taking: Reported on 05/14/2019 08/14/18   Irene Shipper, MD  isosorbide mononitrate (IMDUR) 30 MG 24 hr tablet Take 1 tablet (30 mg total) by mouth daily. Patient not taking: Reported on 05/14/2019 01/22/19 04/22/19  [name], MD    Inpatient Medications: Scheduled Meds: . sodium chloride   Intravenous Once  . carvedilol  12.5 mg Oral BID  . Chlorhexidine Gluconate  Cloth  6 each Topical Daily  . ferrous sulfate  325 mg Oral Q breakfast  . insulin aspart  0-9 Units Subcutaneous TID WC  . insulin glargine  10 Units Subcutaneous Daily  . levothyroxine  88 mcg Oral Q0600  . lidocaine  2 patch Transdermal Q24H  . rosuvastatin  10 mg Oral Daily  . sodium chloride flush  10-40 mL Intracatheter Q12H  . vitamin B-12  100 mcg Oral Daily   Continuous Infusions: . sodium chloride    . vancomycin Stopped (05/20/19 1138)   PRN Meds: sodium chloride, acetaminophen **OR** acetaminophen, alum & mag hydroxide-simeth, docusate sodium, Muscle Rub, nitroGLYCERIN, ondansetron **OR** ondansetron (ZOFRAN) IV, sodium chloride flush  Allergies:    Allergies  Allergen Reactions  . Clonidine Derivatives Other (See Comments)    Bradycardia and fatigue   . Sulfa Antibiotics Other (See Comments)    Unknown  . Crestor [Rosuvastatin Calcium] Other (See Comments)    Other reaction(s): tired and weak  . Epinephrine Other (See Comments)    Abnormal feeling. Dental exam/injection of local w/ epi.  Marland Kitchen Hydralazine Other (See Comments)    Nausea/gi upset   . Losartan Other (See Comments)    Hyperkalemia   . Other Other (See Comments)    MANGO'S - WELTS ALL OVER    Social History:   Social History   Socioeconomic History  . Marital status: Widowed    Spouse name: Not on file  . Number of children: 2  . Years of education: 69  . Highest education level: Not on file  Occupational History  . Occupation: retired  Scientific laboratory technician   . Financial resource strain: Not on file  . Food insecurity    Worry: Not on file    Inability: Not on file  . Transportation needs    Medical: Not on file    Non-medical: Not on file  Tobacco Use  . Smoking status: Never Smoker  . Smokeless tobacco: Never Used  Substance and Sexual Activity  . Alcohol use: Not Currently    Comment: very occasional   . Drug use: Never  . Sexual activity: Not Currently  Lifestyle  . Physical activity    Days per week: Not on file    Minutes per session: Not on file  . Stress: Not on file  Relationships  . Social Herbalist on phone: Not on file    Gets together: Not on file    Attends religious service: Not on file    Active member of club or organization: Not on file    Attends meetings of clubs or organizations: Not on file    Relationship status: Not on file  . Intimate partner violence    Fear of current or ex partner: Not on file    Emotionally abused: Not on file    Physically abused: Not on file    Forced sexual activity: Not on file  Other Topics Concern  . Not on file  Social History Narrative  . Not on file    Family History:    Family History  Problem Relation Age of Onset  . Diabetes Mother   . CVA Mother   . Hypertension Mother   . Heart disease Father   . Hyperlipidemia Father   . Breast cancer Sister        x 3  . Heart disease Brother        x5; one with MI  . Heart disease Sister  x3  . Diabetes Sister        x3  . Lung cancer Sister   . Breast cancer Sister        x2  . Colon cancer Neg Hx      ROS:  Please see the history of present illness.   All other ROS reviewed and negative.     Physical Exam/Data:   Vitals:   05/21/19 0000 05/21/19 0200 05/21/19 0400 05/21/19 0500  BP:  (!) 166/54    Pulse:  69    Resp:  20    Temp: 98.6 F (37 C)  98.3 F (36.8 C)   TempSrc: Oral  Oral   SpO2:  97%    Weight:    66.3 kg  Height:        Intake/Output Summary (Last 24 hours) at  05/21/2019 0743 Last data filed at 05/21/2019 0244 Gross per 24 hour  Intake 400 ml  Output 125 ml  Net 275 ml   Last 3 Weights 05/21/2019 05/20/2019 05/19/2019  Weight (lbs) 146 lb 2.6 oz 152 lb 8.9 oz 143 lb 4.8 oz  Weight (kg) 66.3 kg 69.2 kg 65 kg     Body mass index is 27.17 kg/m.  General:  Well nourished, well developed, in no acute distress HEENT: normal. Very hard of hearing Lymph: no adenopathy Neck: no JVD Endocrine:  No thryomegaly Vascular: No carotid bruits; FA pulses 2+ bilaterally without bruits  Cardiac:  normal S1, S2; RRR; no murmur  Lungs:  clear to auscultation bilaterally, no wheezing, rhonchi or rales  Abd: soft, nontender, no hepatomegaly  Ext: no edema Musculoskeletal:  No deformities, BUE and BLE strength normal and equal Skin: warm and dry  Neuro:  CNs 2-12 intact, no focal abnormalities noted Psych:  Normal affect   EKG:  The EKG was personally reviewed and demonstrates:  Atrial-paced rhythm with prolonged AV conduction, 65 bpm,  ST & T wave abnormality anterior, Prolonged QT, QTC 509- although by my measurement does not look that long, more like 440 Telemetry:  Telemetry was personally reviewed and demonstrates:  Atrial pacing, 60 bpm, with rare AV pacing  Relevant CV Studies:  TEE 05/18/2019 IMPRESSIONS   1. The left ventricle has normal systolic function, with an ejection fraction of 55-60%. The cavity size was normal. No evidence of left ventricular regional wall motion abnormalities.  2. Left atrial size was moderately dilated.  3. Right atrial size was mildly dilated.  4. The mitral valve is abnormal. Mild thickening of the mitral valve leaflet.  5. The tricuspid valve was grossly normal. Tricuspid valve regurgitation is mild-moderate.  6. The aortic valve is tricuspid Mild sclerosis of the aortic valve. Aortic valve regurgitation is trivial by color flow Doppler. No stenosis of the aortic valve.  7. The interatrial septum appears to be  lipomatous.  Cath 09/06/2018  Prox RCA to Mid RCA lesion is 95% stenosed.  Ost LAD to Prox LAD lesion is 100% stenosed.  Ost Cx to Mid Cx lesion is 95% stenosed.  Mid Cx to Dist Cx lesion is 95% stenosed.  Ost 2nd Mrg lesion is 95% stenosed.  Origin lesion is 95% stenosed.  Mid Graft lesion is 50% stenosed.  Mid LAD to Dist LAD lesion is 90% stenosed.  Prox LAD lesion is 90% stenosed.  Severe native CAD with total occlusion of the LAD at the ostium, 95% ostial proximal left circumflex stenosis with 95% bifurcation stenoses involving the AV groove circumflex and OM vessel; and  long 95% proximal RCA stenosis.  Patent LIMA graft supplying the LAD with evidence for mid distal focal 90% LAD stenosis beyond the graft insertion and diffuse 90% stenosis of the LAD proximal to the graft insertion.  SVG to circumflex marginal vessel with 95% near ostial stenosis followed by 50% smooth mid stenosis. Patent SVG supplying the distal RCA.  LVEDP 22 mmHg.   Cath 09/27/2018 Prior to intervention, the saphenous graft supplying the circumflex marginal vessel had a 99% ostial/proximal stenosis with thrombus, followed by 70% proximal stenosis on a bend in the vessel. There was 30% mild irregularity in the midportion of the graft with mild ectasia in the distal aspect of the graft prior to insertion into the marginal vessel.  Following successful intervention with PTCA and insertion of synergy 2.25 x 12 mm stents both ostially and proximally, both lesions were reduced to 0%. There was brisk TIMI-3 flow. There was no evidence for dissection.  RECOMMENDATION: Continued medical therapy for concomitant CAD. Recommend resumption of Xarelto tomorrow with plans for triple drug therapy for 1 month and then change to Plavix/Xarelto for at least 6 months with then resumption of aspirin/Xarelto.   Laboratory Data:  High Sensitivity Troponin:  No results for input(s): TROPONINIHS in the last 720  hours.   Cardiac EnzymesNo results for input(s): TROPONINI in the last 168 hours. No results for input(s): TROPIPOC in the last 168 hours.  Chemistry Recent Labs  Lab 05/20/19 0211 05/20/19 1415 05/21/19 0228  NA 140 137 138  K 4.4 4.7 4.5  CL 109 106 108  CO2 21* 22 19*  GLUCOSE 129* 207* 123*  BUN 60* 58* 54*  CREATININE 2.51* 2.34* 2.14*  CALCIUM 7.6* 7.7* 7.8*  GFRNONAA 18* 19* 21*  GFRAA 20* 22* 25*  ANIONGAP 10 9 11     Recent Labs  Lab 05/14/19 2052 05/15/19 0500  PROT 7.6 7.2  ALBUMIN 4.1 3.9  AST 30 32  ALT 17 17  ALKPHOS 69 67  BILITOT 0.8 0.8   Hematology Recent Labs  Lab 05/20/19 0433 05/20/19 1415 05/21/19 0228  WBC 8.6 8.9 8.8  RBC 2.34* 2.41* 2.30*  HGB 7.3* 7.4* 7.0*  HCT 22.0* 22.9* 22.2*  MCV 94.0 95.0 96.5  MCH 31.2 30.7 30.4  MCHC 33.2 32.3 31.5  RDW 15.1 15.1 15.0  PLT 116* 123* 132*   BNPNo results for input(s): BNP, PROBNP in the last 168 hours.  DDimer No results for input(s): DDIMER in the last 168 hours.   Radiology/Studies:  Ct Abdomen Pelvis Wo Contrast  Result Date: 05/19/2019 CLINICAL DATA:  78 year old female with history of anemia. EXAM: CT ABDOMEN AND PELVIS WITHOUT CONTRAST TECHNIQUE: Multidetector CT imaging of the abdomen and pelvis was performed following the standard protocol without IV contrast. COMPARISON:  CT the abdomen and pelvis 06/06/2015. FINDINGS: Lower chest: Trace right and small left pleural effusions. Minimal dependent atelectasis in the left lower lobe. Patchy peribronchovascular consolidation in the right middle lobe. Mild cardiomegaly. Aortic atherosclerosis. Calcified atherosclerotic plaque in the distal right coronary artery. Pacemaker leads in the right atrium and right ventricle. Hepatobiliary: No definite suspicious cystic or solid hepatic lesions are confidently identified on today's noncontrast CT examination. Calcified granuloma in the right lobe of the liver. Unenhanced appearance of the gallbladder is  unremarkable. Pancreas: No definite pancreatic mass or peripancreatic fluid collections or inflammatory changes noted on today's noncontrast CT examination. Spleen: Unremarkable. Adrenals/Urinary Tract: There are 2 intermediate attenuation lesions in the interpolar region of the left kidney measuring  up to 1.3 cm posteriorly (axial image 30 of series 2), which are incompletely characterized on today's non-contrast CT examination, but statistically likely proteinaceous/hemorrhagic cysts. Moderate renal atrophy bilaterally. Unenhanced appearance of the adrenal glands is normal. No hydroureteronephrosis. Urinary bladder is unremarkable in appearance. Stomach/Bowel: Unenhanced appearance of the stomach is normal. No pathologic dilatation of small bowel or colon. Normal appendix. Vascular/Lymphatic: Aortic atherosclerosis. No lymphadenopathy noted in the abdomen or pelvis. Reproductive: Status post hysterectomy. Ovaries are not confidently identified may be surgically absent or atrophic. Other: High attenuation fluid throughout the left retroperitoneum, compatible with retroperitoneal hemorrhage. The origin of this is uncertain, however, this likely has originated from the left iliacus muscle (discussed below). The largest retroperitoneal portion of this collection measures 8.2 x 10.7 x 5.4 cm (axial image 41 of series 2 and sagittal image 81 of series 5). Trace volume of ascites. No pneumoperitoneum. Musculoskeletal: The left iliacus muscle appears markedly expanded when compared to the contralateral side, and demonstrates layering fluid fluid level within it (axial image 60 of series 2). The enlarged left iliacus muscle measures 7.1 x 14.3 x 4.3 cm (axial image 59 of series 2 and sagittal image 80 of series 5), as compared with the contralateral side which is atrophic and difficult to visualize. Some of this hemorrhage also appears to have extended into the left quadratus lumborum muscle which also appears enlarged  compared to the contralateral side. There are no aggressive appearing lytic or blastic lesions noted in the visualized portions of the skeleton. IMPRESSION: 1. Large intramuscular hemorrhage which appears centered in the left iliacus muscle and to a lesser extent in the lower left quadratus lumborum muscle, with extension into the left retroperitoneum, as detailed above. 2. Trace right and small left pleural effusions lying dependently. Some associated passive subsegmental atelectasis in the left lower lobe. 3. Patchy peribronchovascular airspace consolidation in the right middle lobe which could suggest a region of bronchopneumonia. 4. Trace volume of ascites. 5. Mild cardiomegaly. 6. Aortic atherosclerosis, in addition to at least right coronary artery disease. Assessment for potential risk factor modification, dietary therapy or pharmacologic therapy may be warranted, if clinically indicated. Electronically Signed   By: Vinnie Langton M.D.   On: 05/19/2019 10:12   Dg Abd 1 View  Result Date: 05/19/2019 CLINICAL DATA:  Abdominal distension. EXAM: ABDOMEN - 1 VIEW COMPARISON:  None. FINDINGS: Nondistended gas-filled loops of colon and small bowel are noted. Stool in the colon is present. No suspicious calcifications are identified. No acute bony abnormalities are identified. IMPRESSION: Nonspecific nonobstructive bowel gas pattern. Electronically Signed   By: Margarette Canada M.D.   On: 05/19/2019 09:41   Dg Hip Unilat With Pelvis 1v Left  Result Date: 05/17/2019 CLINICAL DATA:  Left groin pain EXAM: DG HIP (WITH OR WITHOUT PELVIS) 2V*L* COMPARISON:  None. FINDINGS: No acute fracture or dislocation is noted. No soft tissue abnormality is noted. Pelvic ring is intact. No other focal abnormality is noted. IMPRESSION: No acute abnormality is seen. Electronically Signed   By: Inez Catalina M.D.   On: 05/17/2019 17:37    Assessment and Plan:   1. Paroxysmal atrial fibrillation -currently atrial pacing rhythm.   -Last PPM interrogation in April showed <1% AT/AF burden.  -Pt was anticoagulated with Xarelto at home for stroke risk reduction.  This is now on hold due to acute blood loss anemia.  According to cath notes in 09/2018, Plavix could be discontinued after 6 months post stent which is now passed.  In  setting of recent hemorrhage, would discontinue Plavix.  Will discuss need for aspirin 81 mg with Dr. Sallyanne Kuster.  -Continue to hold Xarelto until full recovery of hemorrhage.  Would need to consider whether to resume long-term anticoagulation or not, likely at outpatient follow up with Dr. Debara Pickett.  2. Acute blood loss anemia on anemia of chronic disease -Patient found to have intramuscular hemorrhage with extension into the left retroperitoneum. -Hgb down to 5.4.  -Status post PRBCs x2, FFP x1 on 7/25. Hgb up to 8.0 after transfusion, 7.0 today.  -Xarelto and Plavix are now on hold.  3. Chronic diastolic heart failure: Patient has been medically treated with carvedilol 12.5 mg twice daily, torsemide 40 mg daily.  Torsemide is currently on hold due to current illness. -Normal LV systolic function on TEE.  -Monitor volume status closely. Consider reducing torsemide prior to discharge. I discussed possibly reducing dose to 20 with daughter and having her monitor pt closely at home for increase in dyspnea, edema, wt. Adjust diuretic as needed.   4. CAD s/p CABG 2013: Status post stenting of the SVG to OM in 09/2018. -The patient has been on Xarelto and Plavix. Now on hold due to acute hemorrhage with blood loss anemia.  -Would continue to hold Plavix, could consider using aspirin 81 mg. Will leave to Dr. Sallyanne Kuster.   5. SIRS  -Patient presented with fever, encephalopathy and weakness. -COVID negative testing.  Found to have bacteremia with h/o cat scratch. Patient is being treated with IV antibiotics and followed by infectious disease MD. -Clinical improvement.   6. Hypertension: BP elevated  140's-160's  7. CKD stage IV: SCr up to 2.51. down to 2.14 today. Torsemide is on hold.   8. Hyperlipidemia: On Crestor 10 mg 9. Diabetes type 2 insulin 10. Hypothyroidism: On thyroid replacement per primary care. 11. History of tachybradycardia syndrome status post Parrott pacemaker: Followed by EP      For questions or updates, please contact [place] Please consult [email or web address] for contact info under     Signed, Daune Perch, NP  05/21/2019 7:43 AM   I have seen and examined the patient along with Daune Perch, NP.  I have reviewed the chart, notes and new data.  I agree with NP's note.  Key new complaints: feels weak, no angina or dyspnea Key examination changes: no overt CHF findings, RRR Key new findings / data: A paced V sensed rhythm. Review of multiple device downloads shows not a single episode of atrial fibrillation  since device implantation in 2017 (although she had clear AFib and required cardioversion at the time of device implantation).  PLAN: At this point, the risks of anticoagulation clearly exceed to benefits.   - Stop Xarelto.  Reprogrammed the pacemaker to immediately report any episode of atrial arrhythmia > 6 minutes. If this occurs, would use Eliquis for what is generally perceived as a lower bleeding risk compared to Xarelto.  - Stop clopidogrel.  Her stent was placed in elective fashion, not during acute coronary intervention >8 months ago.  - Start ASA 81 mg daily at discharge.  Sanda Klein, MD, [place] [phone] 05/21/2019, 2:07 PM

## 2019-05-21 NOTE — Progress Notes (Signed)
PT Cancellation Note  Patient Details Name: [name] MRN: [id number] DOB: 1940/11/17   Cancelled Treatment:    Reason Eval/Treat Not Completed: Medical issues which prohibited therapy--hgb 7.0-transfusion ordered. Will hold PT for now.   Weston Anna, PT Acute Rehabilitation Services Pager: [phone] Office: [phone]

## 2019-05-21 NOTE — Progress Notes (Signed)
[facility] for Infectious Disease   Reason for visit: Follow up on MR-CoNS sepsis  Antibiotics: Vancomycin, day 6  Interval History: Pt transferred to step-down unit over the weekend due to acute anemia (Hgb dropped to 5.4, requiring blood transfusions) and CT imaging showed large retroperitoneal bleed with hematoma measuring 8.2 x 10.7 x 5.4 cm. (radiologist feels bleed is originating from LT iliacus muscle). C/O LT groin pain and hospital food. Daughter at bedside and hearing aides are now intact. Nursing is awaiting 2nd unit of PRBCs to transfuse patient this afternoon. Fever curve, labs, blood cxs, ABX usage, and imaging independently reviewed    Current Facility-Administered Medications:  .  0.9 %  sodium chloride infusion, , Intravenous, PRN, Hilty, Nadean Corwin, MD .  acetaminophen (TYLENOL) tablet 650 mg, 650 mg, Oral, Q6H PRN, 650 mg at 05/20/19 2200 **OR** acetaminophen (TYLENOL) suppository 650 mg, 650 mg, Rectal, Q6H PRN, Hilty, Nadean Corwin, MD .  alum & mag hydroxide-simeth (MAALOX/MYLANTA) 200-200-20 MG/5ML suspension 30 mL, 30 mL, Oral, Q4H PRN, [name], MD, 30 mL at 05/19/19 2250 .  carvedilol (COREG) tablet 12.5 mg, 12.5 mg, Oral, BID, Florencia Reasons, MD, 12.5 mg at 05/21/19 0931 .  Chlorhexidine Gluconate Cloth 2 % PADS 6 each, 6 each, Topical, Daily, Florencia Reasons, MD, 6 each at 05/21/19 0932 .  docusate sodium (COLACE) capsule 100 mg, 100 mg, Oral, BID PRN, Hilty, Nadean Corwin, MD .  ferrous sulfate tablet 325 mg, 325 mg, Oral, Q breakfast, Hilty, Nadean Corwin, MD, 325 mg at 05/21/19 0851 .  insulin aspart (novoLOG) injection 0-9 Units, 0-9 Units, Subcutaneous, TID WC, Hilty, Nadean Corwin, MD, 2 Units at 05/21/19 1236 .  insulin glargine (LANTUS) injection 10 Units, 10 Units, Subcutaneous, Daily, Florencia Reasons, MD, 10 Units at 05/21/19 0931 .  levothyroxine (SYNTHROID) tablet 88 mcg, 88 mcg, Oral, Q0600, [name], MD, 88 mcg at 05/21/19 0530 .  lidocaine (LIDODERM) 5 % 2 patch,  2 patch, Transdermal, Q24H, Florencia Reasons, MD, 2 patch at 05/20/19 1827 .  Muscle Rub CREA, , Topical, PRN, Hilty, Nadean Corwin, MD .  nitroGLYCERIN (NITROSTAT) SL tablet 0.4 mg, 0.4 mg, Sublingual, Q5 min PRN, Hilty, Nadean Corwin, MD .  ondansetron (ZOFRAN) tablet 4 mg, 4 mg, Oral, Q6H PRN **OR** ondansetron (ZOFRAN) injection 4 mg, 4 mg, Intravenous, Q6H PRN, [name], MD, 4 mg at 05/19/19 1023 .  rosuvastatin (CRESTOR) tablet 10 mg, 10 mg, Oral, Daily, Hilty, Nadean Corwin, MD, 10 mg at 05/21/19 0931 .  sodium chloride flush (NS) 0.9 % injection 10-40 mL, 10-40 mL, Intracatheter, Q12H, Florencia Reasons, MD, 10 mL at 05/21/19 0931 .  sodium chloride flush (NS) 0.9 % injection 10-40 mL, 10-40 mL, Intracatheter, PRN, Florencia Reasons, MD .  vancomycin (VANCOCIN) IVPB 750 mg/150 ml premix, 750 mg, Intravenous, Q48H, Hilty, Nadean Corwin, MD, Stopped at 05/20/19 1138 .  vitamin B-12 (CYANOCOBALAMIN) tablet 100 mcg, 100 mcg, Oral, Daily, Hilty, Nadean Corwin, MD, 100 mcg at 05/21/19 0931   Physical Exam:   Vitals:   05/21/19 1149 05/21/19 1200  BP: (!) 164/92   Pulse:    Resp:    Temp: 98.5 F (36.9 C) 98.7 F (37.1 C)  SpO2:     Physical Exam Gen: NAD, A&Ox 3, hard of hearing but improved now that hearing aides are intact Head: NCAT, no temporal wasting evident EENT: PERRL, EOMI, MMM, adequate dentition Neck: supple, mild JVD CV: NRRR, no murmurs evident, PM palpable w/o tenderness or  erythema overlying device Pulm: CTA bilaterally, no wheeze or retractions Abd: soft, NT, mild TTP at LLQ, no palpable mass or abdominal wall bruising evident though, +BS Extrems:  trace LE edema, 2+ pulses Skin: multiple linear scratches/healing lesions to both LEs, most notable along her lateral LT ankle, adequate skin turgor Neuro: CN II-XII grossly intact except poor hearing as noted above, no focal neurologic deficits appreciated, gait was not assessed, A&Ox 3   Review of Systems:  Review of Systems  Constitutional: Positive  for fever and malaise/fatigue. Negative for chills and weight loss.  HENT: Positive for hearing loss. Negative for congestion, sinus pain and sore throat.   Eyes: Negative for blurred vision, photophobia and discharge.  Respiratory: Negative for cough, hemoptysis and shortness of breath.   Cardiovascular: Negative for chest pain, palpitations, orthopnea and leg swelling.  Gastrointestinal: Negative for abdominal pain, constipation, diarrhea, heartburn, nausea and vomiting.  Genitourinary: Negative for dysuria, flank pain, frequency and urgency.  Musculoskeletal: Negative for back pain, joint pain and myalgias.  Skin: Negative for itching and rash.  Neurological: Positive for weakness. Negative for tremors, seizures and headaches.  Endo/Heme/Allergies: Negative for polydipsia. Does not bruise/bleed easily.  Psychiatric/Behavioral: Negative for depression and substance abuse. The patient is not nervous/anxious and does not have insomnia.      Lab Results  Component Value Date   WBC 8.8 05/21/2019   HGB 7.0 (L) 05/21/2019   HCT 22.2 (L) 05/21/2019   MCV 96.5 05/21/2019   PLT 132 (L) 05/21/2019    Lab Results  Component Value Date   CREATININE 2.14 (H) 05/21/2019   BUN 54 (H) 05/21/2019   NA 138 05/21/2019   K 4.5 05/21/2019   CL 108 05/21/2019   CO2 19 (L) 05/21/2019    Lab Results  Component Value Date   ALT 17 05/15/2019   AST 32 05/15/2019   ALKPHOS 67 05/15/2019     Microbiology: Recent Results (from the past 240 hour(s))  Blood Culture (routine x 2)     Status: Abnormal   Collection Time: 05/14/19  8:52 PM   Specimen: BLOOD RIGHT ARM  Result Value Ref Range Status   Specimen Description   Final    BLOOD RIGHT ARM Performed at [facility], [place] [address], [place], [place] [address]    Special Requests   Final    BOTTLES DRAWN AEROBIC AND ANAEROBIC Blood Culture results may not be optimal due to an inadequate volume of blood received in culture  bottles Performed at [facility] [address], [place], [place] [address]    Culture  Setup Time   Final    GRAM POSITIVE COCCI IN BOTH AEROBIC AND ANAEROBIC BOTTLES Organism ID to follow CRITICAL RESULT CALLED TO, READ BACK BY AND VERIFIED WITH: J LEGGE PHARMD [id number] 05/15/19 A BROWNING    Culture (A)  Final    STAPHYLOCOCCUS SPECIES (COAGULASE NEGATIVE) THE SIGNIFICANCE OF ISOLATING THIS ORGANISM FROM A SINGLE SET OF BLOOD CULTURES WHEN MULTIPLE SETS ARE DRAWN IS UNCERTAIN. PLEASE NOTIFY THE MICROBIOLOGY DEPARTMENT WITHIN ONE WEEK IF SPECIATION AND SENSITIVITIES ARE REQUIRED. Performed at [facility], [place] [address] [place], [place] [address]    Report Status 05/17/2019 FINAL  Final  Urine culture     Status: Abnormal   Collection Time: 05/14/19  8:52 PM   Specimen: In/Out Cath Urine  Result Value Ref Range Status   Specimen Description   Final    IN/OUT CATH URINE Performed at  [facility], [place] [address], [place], [place] [address]    Special Requests   Final    NONE Performed at [facility], [place] [address] [place], [place] [address]    Culture (A)  Final    <10,000 COLONIES/mL INSIGNIFICANT GROWTH Performed at [facility] [address], [place], [place] [address]    Report Status 05/15/2019 FINAL  Final  Blood Culture ID Panel (Reflexed)     Status: Abnormal   Collection Time: 05/14/19  8:52 PM  Result Value Ref Range Status   Enterococcus species NOT DETECTED NOT DETECTED Final   Listeria monocytogenes NOT DETECTED NOT DETECTED Final   Staphylococcus species DETECTED (A) NOT DETECTED Final    Comment: Methicillin (oxacillin) resistant coagulase negative staphylococcus. Possible blood culture contaminant (unless isolated from more than one blood culture draw or clinical case suggests pathogenicity). No antibiotic treatment is indicated for blood  culture contaminants. CRITICAL RESULT CALLED TO,  READ BACK BY AND VERIFIED WITH: Christean Grief PHARMD 1814 05/15/19 A BROWNING    Staphylococcus aureus (BCID) NOT DETECTED NOT DETECTED Final   Methicillin resistance DETECTED (A) NOT DETECTED Final    Comment: CRITICAL RESULT CALLED TO, READ BACK BY AND VERIFIED WITH: Christean Grief PHARMD 1814 05/15/19 A BROWNING    Streptococcus species DETECTED (A) NOT DETECTED Final    Comment: Not Enterococcus species, Streptococcus agalactiae, Streptococcus pyogenes, or Streptococcus pneumoniae. CRITICAL RESULT CALLED TO, READ BACK BY AND VERIFIED WITH: Christean Grief PHARMD 1814 05/15/19 A BROWNING    Streptococcus agalactiae NOT DETECTED NOT DETECTED Final   Streptococcus pneumoniae NOT DETECTED NOT DETECTED Final   Streptococcus pyogenes NOT DETECTED NOT DETECTED Final   Acinetobacter baumannii NOT DETECTED NOT DETECTED Final   Enterobacteriaceae species NOT DETECTED NOT DETECTED Final   Enterobacter cloacae complex NOT DETECTED NOT DETECTED Final   Escherichia coli NOT DETECTED NOT DETECTED Final   Klebsiella oxytoca NOT DETECTED NOT DETECTED Final   Klebsiella pneumoniae NOT DETECTED NOT DETECTED Final   Proteus species NOT DETECTED NOT DETECTED Final   Serratia marcescens NOT DETECTED NOT DETECTED Final   Haemophilus influenzae NOT DETECTED NOT DETECTED Final   Neisseria meningitidis NOT DETECTED NOT DETECTED Final   Pseudomonas aeruginosa NOT DETECTED NOT DETECTED Final   Candida albicans NOT DETECTED NOT DETECTED Final   Candida glabrata NOT DETECTED NOT DETECTED Final   Candida krusei NOT DETECTED NOT DETECTED Final   Candida parapsilosis NOT DETECTED NOT DETECTED Final   Candida tropicalis NOT DETECTED NOT DETECTED Final    Comment: Performed at [facility], [place] [address], [place], [place] [address]  SARS Coronavirus 2 (CEPHEID- Performed in [facility] hospital lab), Hosp Order     Status: None   Collection Time: 05/14/19  8:55 PM   Specimen: Nasopharyngeal Swab  Result Value Ref Range Status    SARS Coronavirus 2 NEGATIVE NEGATIVE Final    Comment: (NOTE) If result is NEGATIVE SARS-CoV-2 target nucleic acids are NOT DETECTED. The SARS-CoV-2 RNA is generally detectable in upper and lower  respiratory specimens during the acute phase of infection. The lowest  concentration of SARS-CoV-2 viral copies this assay can detect is 250  copies / mL. A negative result does not preclude SARS-CoV-2 infection  and should not be used as the sole basis for treatment or other  patient management decisions.  A negative result may occur with  improper specimen collection / handling, submission of specimen other  than nasopharyngeal swab,  presence of viral mutation(s) within the  areas targeted by this assay, and inadequate number of viral copies  (<250 copies / mL). A negative result must be combined with clinical  observations, patient history, and epidemiological information. If result is POSITIVE SARS-CoV-2 target nucleic acids are DETECTED. The SARS-CoV-2 RNA is generally detectable in upper and lower  respiratory specimens dur ing the acute phase of infection.  Positive  results are indicative of active infection with SARS-CoV-2.  Clinical  correlation with patient history and other diagnostic information is  necessary to determine patient infection status.  Positive results do  not rule out bacterial infection or co-infection with other viruses. If result is PRESUMPTIVE POSTIVE SARS-CoV-2 nucleic acids MAY BE PRESENT.   A presumptive positive result was obtained on the submitted specimen  and confirmed on repeat testing.  While 2019 novel coronavirus  (SARS-CoV-2) nucleic acids may be present in the submitted sample  additional confirmatory testing may be necessary for epidemiological  and / or clinical management purposes  to differentiate between  SARS-CoV-2 and other Sarbecovirus currently known to infect humans.  If clinically indicated additional testing with an alternate test   methodology [phone]) is advised. The SARS-CoV-2 RNA is generally  detectable in upper and lower respiratory sp ecimens during the acute  phase of infection. The expected result is Negative. Fact Sheet for Patients:  StrictlyIdeas.no Fact Sheet for Healthcare Providers: BankingDealers.co.za This test is not yet approved or cleared by the [place] FDA and has been authorized for detection and/or diagnosis of SARS-CoV-2 by FDA under an Emergency Use Authorization (EUA).  This EUA will remain in effect (meaning this test can be used) for the duration of the COVID-19 declaration under Section 564(b)(1) of the Act, 21 U.S.C. section 360bbb-3(b)(1), unless the authorization is terminated or revoked sooner. Performed at [facility], [place] [address], [place], [place] [address]   Blood Culture (routine x 2)     Status: None   Collection Time: 05/14/19  8:57 PM   Specimen: BLOOD LEFT ARM  Result Value Ref Range Status   Specimen Description   Final    BLOOD LEFT ARM Performed at [facility], [place] [address] [place], [place] [address]    Special Requests   Final    BOTTLES DRAWN AEROBIC AND ANAEROBIC Blood Culture adequate volume Performed at [facility] [address] [place], [place] [address]    Culture   Final    NO GROWTH 5 DAYS Performed at [facility], [place] [address], [place], [place] [address]    Report Status 05/19/2019 FINAL  Final  Novel Coronavirus,NAA,(SEND-OUT TO REF LAB - TAT 24-48 hrs); Hosp Order     Status: None   Collection Time: 05/14/19 11:50 PM   Specimen: Nasopharyngeal Swab; Respiratory  Result Value Ref Range Status   SARS-CoV-2, NAA NOT DETECTED NOT DETECTED Final    Comment: (NOTE) This test was developed and its performance characteristics determined by [employer]. This test has not been FDA cleared or approved. This test has been authorized  by FDA under an Emergency Use Authorization (EUA). This test is only authorized for the duration of time the declaration that circumstances exist justifying the authorization of the emergency use of in vitro diagnostic tests for detection of SARS-CoV-2 virus and/or diagnosis of COVID-19 infection under section 564(b)(1) of the Act, 21 U.S.C. 283MOQ-9(U)(7), unless the authorization is terminated or revoked sooner. When diagnostic testing is negative, the possibility of  a false negative result should be considered in the context of a patient's recent exposures and the presence of clinical signs and symptoms consistent with COVID-19. An individual without symptoms of COVID-19 and who is not shedding SARS-CoV-2 virus would expect to have a negative (not detected) result in this assay. Performed  At: [facility] [place], [place] [id number] Rush Farmer MD QP:[id number]    [place]  Final    Comment: Performed at [facility] [address], [place], [place] [address]  Culture, blood (routine x 2)     Status: None   Collection Time: 05/16/19  8:08 PM   Specimen: BLOOD RIGHT HAND  Result Value Ref Range Status   Specimen Description   Final    BLOOD RIGHT HAND Performed at [facility] [address] [place], [place] [address]    Special Requests   Final    BOTTLES DRAWN AEROBIC ONLY Blood Culture adequate volume Performed at [place] [address], [place], [place] [address]    Culture   Final    NO GROWTH 5 DAYS Performed at [facility], [place] [address] [place], [place] [address]    Report Status 05/21/2019 FINAL  Final  Culture, blood (routine x 2)     Status: None   Collection Time: 05/16/19  8:08 PM   Specimen: BLOOD  Result Value Ref Range Status   Specimen Description   Final    BLOOD RIGHT ANTECUBITAL Performed at [facility] [address], [place], [place] [address]    Special Requests   Final    BOTTLES DRAWN AEROBIC ONLY Blood Culture adequate volume Performed at [facility] [address] [place], [place] [address]    Culture   Final    NO GROWTH 5 DAYS Performed at [facility], [place] [address], [place], [place] [address]    Report Status 05/21/2019 FINAL  Final  MRSA PCR Screening     Status: None   Collection Time: 05/20/19 10:27 PM   Specimen: Nasal Mucosa; Nasopharyngeal  Result Value Ref Range Status   MRSA by PCR NEGATIVE NEGATIVE Final    Comment:        The GeneXpert MRSA Assay (FDA approved for NASAL specimens only), is one component of a comprehensive MRSA colonization surveillance program. It is not intended to diagnose MRSA infection nor to guide or monitor treatment for MRSA infections. Performed at [facility], [place] [address], [place],  [address]     Impression/Plan: The patient is a 78 y/o WF diabetic with CAD, s/p CABG and PTCI, tachy-brady syndrome, s/p PM, and stage IV CRI admitted with fever and confusion and probable CoNS sepsis.  1. CoNS sepsis - continue vanc dosed for goal trough of 15-20. D/c'ed rocephin as MR-CoNS confirmed. TTE showed no clear evidence IE or infection to PM but did comment on eccentric jet to MV and mild-moderate TR, so proceeded with TEE to further evaluate (results showed no evidence of endocarditis. Repeat blood cxs x 2 (showing NGTD thus far) to assess for clearance of her BSI.  Suspect her CoNS sepsis did arise from her recent cat scratches to her LEs. I have educated the pt and her daughter on this risk and counselled her on methods to lower the risk of acquiring injury from the cat's claws, including wearing longer pants, providing adequate toys for the cat to play with,  and using bitter apple spray to her skin to train cat not to bite or scratch her skin. Unfortunately, cannot safely transition her  to zyvox due to her baseline thrombocytopenia (see below), so will need to treat with vancomycin. Due to her poor kidney function, will place midline later today. Vanc dosing currently q 48 hrs, vanc levels are now appropriate. Daughter will arrange supervision for her there as the patient is refusing SNF placement for tx. Tentative d/c date for vancomycin will be 06/05/2019 for 3 week duration given new pelvic/muscular hematoma in an effort to prevent later involution to psoas abscess. Ambulation encouraged in order to assist with resorption of hematoma over time. Daughter aware that psoas abscess may still develop moving forward despite this strategy.  2. Retroperitoneal bleed - Unclear of inciting event as nurses' report no fall on prior floor prior to transfer to step down unit. Thrombocytopenia may have been playing a contributing factor, but is not severe enough where spontaneous bleeding would be suspected. Hgb dropped to 5.4 with sizeable iliacus hematoma evident on recent CT. Hgb improving with recent transfusion but reminded pt this will need to remain stable as should she decline further from home, her renal function could worsen and she will at minimum will need further blood transfusions, which cannot be arranged as an OP.   3. CRI - Pt has known stage IV CRI per records reviewed. Cr fluctuates from 1.8-2.5 over the past year, so uncertain of true baseline Cr. Daughter confirms that the patient does see a nephrologist as an OP. Unfortunately, her infection necessitates vancomycin for now. Appreciate pharmacy assistance with dosing. Check BMP daily to follow Cr trend to assist with appropriate ABX dosing. Will place midline rather than PICC for OP IV ABX due to her known CRI.  4. Thrombocytopenia - Uncertain if this is the patient's baseline or if this is more due to her current sepsis. TTE and TEE were both negative for endocarditis or PM wire vegetation. Baseline thrombocytopenia makes zyvox a  poor alternative option to vancomycin. Daughter made aware.

## 2019-05-21 NOTE — Progress Notes (Signed)
PT Cancellation Note  Patient Details Name: [name] MRN: [id number] DOB: September 14, 1941   Cancelled Treatment:    Reason Eval/Treat Not Completed: Medical issues which prohibited therapy--pt to receive 2 blood transfusions today. Will hold PT and check back another day/time.   Weston Anna, PT Acute Rehabilitation Services Pager: [phone] Office: [phone]

## 2019-05-22 ENCOUNTER — Ambulatory Visit (INDEPENDENT_AMBULATORY_CARE_PROVIDER_SITE_OTHER): Payer: Medicare Other | Admitting: *Deleted

## 2019-05-22 DIAGNOSIS — D62 Acute posthemorrhagic anemia: Secondary | ICD-10-CM

## 2019-05-22 DIAGNOSIS — I495 Sick sinus syndrome: Secondary | ICD-10-CM

## 2019-05-22 LAB — CBC
HCT: 31.9 % — ABNORMAL LOW (ref 36.0–46.0)
Hemoglobin: 10.6 g/dL — ABNORMAL LOW (ref 12.0–15.0)
MCH: 31 pg (ref 26.0–34.0)
MCHC: 33.2 g/dL (ref 30.0–36.0)
MCV: 93.3 fL (ref 80.0–100.0)
Platelets: 158 10*3/uL (ref 150–400)
RBC: 3.42 MIL/uL — ABNORMAL LOW (ref 3.87–5.11)
RDW: 15.4 % (ref 11.5–15.5)
WBC: 10.5 10*3/uL (ref 4.0–10.5)
nRBC: 0 % (ref 0.0–0.2)

## 2019-05-22 LAB — BPAM RBC
Blood Product Expiration Date: 202008102359
Blood Product Expiration Date: 202008102359
Blood Product Expiration Date: 202008102359
Blood Product Expiration Date: 202008112359
ISSUE DATE / TIME: 202007251010
ISSUE DATE / TIME: 202007251327
ISSUE DATE / TIME: 202007270959
ISSUE DATE / TIME: 202007271534
Unit Type and Rh: 600
Unit Type and Rh: 600
Unit Type and Rh: 600
Unit Type and Rh: 600

## 2019-05-22 LAB — TYPE AND SCREEN
ABO/RH(D): A NEG
Antibody Screen: NEGATIVE
Unit division: 0
Unit division: 0
Unit division: 0
Unit division: 0

## 2019-05-22 LAB — CREATININE, SERUM
Creatinine, Ser: 1.9 mg/dL — ABNORMAL HIGH (ref 0.44–1.00)
GFR calc Af Amer: 29 mL/min — ABNORMAL LOW (ref >60)
GFR calc non Af Amer: 25 mL/min — ABNORMAL LOW (ref >60)

## 2019-05-22 LAB — VANCOMYCIN, TROUGH: Vancomycin Tr: 14 ug/mL — ABNORMAL LOW (ref 15–20)

## 2019-05-22 LAB — GLUCOSE, CAPILLARY
Glucose-Capillary: 148 mg/dL — ABNORMAL HIGH (ref 70–99)
Glucose-Capillary: 178 mg/dL — ABNORMAL HIGH (ref 70–99)

## 2019-05-22 MED ORDER — TORSEMIDE 20 MG PO TABS: 20.0000 mg | ORAL_TABLET | Freq: Every day | ORAL | 2 refills | Status: DC

## 2019-05-22 MED ORDER — VANCOMYCIN IV (FOR PTA / DISCHARGE USE ONLY): 750.0000 mg | INTRAVENOUS | 0 refills | Status: DC

## 2019-05-22 MED ORDER — VANCOMYCIN IV (FOR PTA / DISCHARGE USE ONLY): 750.0000 mg | INTRAVENOUS | 0 refills | Status: AC

## 2019-05-22 MED ORDER — GABAPENTIN 300 MG PO CAPS: 300.0000 mg | ORAL_CAPSULE | Freq: Every day | ORAL | 2 refills | Status: DC

## 2019-05-22 MED ORDER — CARVEDILOL 25 MG PO TABS: 25.0000 mg | ORAL_TABLET | Freq: Two times a day (BID) | ORAL | 3 refills | Status: DC

## 2019-05-22 MED ORDER — ASPIRIN EC 81 MG PO TBEC: 81.0000 mg | DELAYED_RELEASE_TABLET | Freq: Every day | ORAL | 0 refills | Status: AC

## 2019-05-22 NOTE — Progress Notes (Addendum)
Progress Note  Patient Name: [name] Date of Encounter: 05/22/2019  Primary Cardiologist: [name], MD   Subjective   Pt is feeling well today. No chest discomfort or shortness of breath. She is lying almost flat with no difficulty breathing. No peripheral edema. Leg is feeling better. Now has PICC line in place and she hopes to be discharged today.   Inpatient Medications    Scheduled Meds: . carvedilol  25 mg Oral BID  . Chlorhexidine Gluconate Cloth  6 each Topical Daily  . Chlorhexidine Gluconate Cloth  6 each Topical Daily  . ferrous sulfate  325 mg Oral Q breakfast  . insulin aspart  0-9 Units Subcutaneous TID WC  . insulin glargine  10 Units Subcutaneous Daily  . isosorbide mononitrate  30 mg Oral Daily  . levothyroxine  88 mcg Oral Q0600  . lidocaine  2 patch Transdermal Q24H  . rosuvastatin  10 mg Oral Daily  . sodium chloride flush  10-40 mL Intracatheter Q12H  . vitamin B-12  100 mcg Oral Daily   Continuous Infusions: . sodium chloride    . vancomycin 750 mg (05/22/19 0930)   PRN Meds: sodium chloride, acetaminophen **OR** acetaminophen, alum & mag hydroxide-simeth, docusate sodium, hydrALAZINE, magnesium hydroxide, Muscle Rub, nitroGLYCERIN, ondansetron **OR** ondansetron (ZOFRAN) IV, simethicone, sodium chloride flush   Vital Signs    Vitals:   05/22/19 0500 05/22/19 0658 05/22/19 0800 05/22/19 0931  BP:  (!) 158/75    Pulse:  69 64 60  Resp:  17 19   Temp:   (!) 97.5 F (36.4 C)   TempSrc:   Oral   SpO2:  96% 97%   Weight: 66.1 kg     Height:        Intake/Output Summary (Last 24 hours) at 05/22/2019 1102 Last data filed at 05/22/2019 0931 Gross per 24 hour  Intake 865.42 ml  Output -  Net 865.42 ml   Last 3 Weights 05/22/2019 05/21/2019 05/20/2019  Weight (lbs) 145 lb 11.6 oz 146 lb 2.6 oz 152 lb 8.9 oz  Weight (kg) 66.1 kg 66.3 kg 69.2 kg      Telemetry    Atrial pacing in the 60's.  - Personally Reviewed  ECG    No new  tracings.  - Personally Reviewed  Physical Exam   GEN: No acute distress.   Neck: No JVD Cardiac: RRR, no murmurs, rubs, or gallops.  Respiratory: Clear to auscultation bilaterally. GI: Soft, nontender, non-distended  MS: No edema; No deformity. Neuro:  Nonfocal  Psych: Normal affect   Labs    High Sensitivity Troponin:  No results for input(s): TROPONINIHS in the last 720 hours.    Cardiac EnzymesNo results for input(s): TROPONINI in the last 168 hours. No results for input(s): TROPIPOC in the last 168 hours.   Chemistry Recent Labs  Lab 05/20/19 0211 05/20/19 1415 05/21/19 0228 05/22/19 0930  NA 140 137 138  --   K 4.4 4.7 4.5  --   CL 109 106 108  --   CO2 21* 22 19*  --   GLUCOSE 129* 207* 123*  --   BUN 60* 58* 54*  --   CREATININE 2.51* 2.34* 2.14* 1.90*  CALCIUM 7.6* 7.7* 7.8*  --   GFRNONAA 18* 19* 21* 25*  GFRAA 20* 22* 25* 29*  ANIONGAP 10 9 11   --      Hematology Recent Labs  Lab 05/21/19 0228 05/21/19 1959 05/22/19 0211  WBC 8.8 9.8  10.5  RBC 2.30* 3.49* 3.42*  HGB 7.0* 10.6* 10.6*  HCT 22.2* 32.7* 31.9*  MCV 96.5 93.7 93.3  MCH 30.4 30.4 31.0  MCHC 31.5 32.4 33.2  RDW 15.0 15.0 15.4  PLT 132* 158 158    BNPNo results for input(s): BNP, PROBNP in the last 168 hours.   DDimer No results for input(s): DDIMER in the last 168 hours.   Radiology    Dg Chest Port 1 View  Result Date: 05/21/2019 CLINICAL DATA:  PICC line placement. EXAM: PORTABLE CHEST 1 VIEW COMPARISON:  Radiographs 05/14/2019 and 04/01/2018. FINDINGS: 1804 hours. Right arm PICC projects to the mid SVC level. Left subclavian pacemaker leads are unchanged. There is stable mild cardiomegaly post median sternotomy and CABG. There is mildly increased atelectasis at the left lung base. No edema or pneumothorax. Stable chronic deformity of the left femoral neck. IMPRESSION: Right arm PICC projects to the mid SVC level. No pneumothorax. Mild left basilar atelectasis. Electronically  Signed   By: Richardean Sale M.D.   On: 05/21/2019 18:31   [place] Ekg Site Rite  Result Date: 05/21/2019 If Site Rite image not attached, placement could not be confirmed due to current cardiac rhythm.   Cardiac Studies   TEE 05/18/2019 IMPRESSIONS   1. The left ventricle has normal systolic function, with an ejection fraction of 55-60%. The cavity size was normal. No evidence of left ventricular regional wall motion abnormalities.  2. Left atrial size was moderately dilated.  3. Right atrial size was mildly dilated.  4. The mitral valve is abnormal. Mild thickening of the mitral valve leaflet.  5. The tricuspid valve was grossly normal. Tricuspid valve regurgitation is mild-moderate.  6. The aortic valve is tricuspid Mild sclerosis of the aortic valve. Aortic valve regurgitation is trivial by color flow Doppler. No stenosis of the aortic valve.  7. The interatrial septum appears to be lipomatous.   Patient Profile     78 y.o. female  with a hx of CAD s/p CABG 2013 and stenting, paroxysmal atrial fibrillation on Xarelto, essential hypertension, upper lipidemia, hypothyroidism, IDDM, CKD stage IV, chronic CHF, tachybradycardia syndrome s/p [employer] Jude permanent pacemaker 2017, chronic anemia who is being seen for the evaluation of anticoagulation management.  Patient admitted for fever, encephalopathy and weakness.  Being treated for SIRS with bacteremia.  She developed blood loss anemia due to spontaneous intramuscular hemorrhage.    Assessment & Plan    Acute blood loss anemia on anemia of chronic disease -Patient found to have intramuscular hemorrhage with extension into the left retroperitoneum. -Hemoglobin down to 5.4.  Status post PRBCs x2, FFP x1 on 7/25.  Hemoglobin now stable at 10.6. -Anticoagulation and antiplatelet therapy on hold. -I will arrange for office follow up in a few weeks.   Paroxysmal atrial fibrillation -Anticoagulation currently on hold per above. -Currently  atrial pacing rhythm. -Last PPM interrogation in April showed less than 1% AT/AF burden. -Pacemaker reprogrammed to immediately report any episode of atrial arrhythmia greater than 6 minutes.  If this occurs would resume anticoagulation with Eliquis which is generally perceived to have a lower bleeding risk compared to Xarelto.  CAD s/p CABG 2013 -S/p stenting of the SVG to OM in 09/2008, not associated with acute coronary syndrome. -Plavix is currently on hold due to intramuscular hemorrhage.  Since her stent was placed greater than 8 months ago, okay to continue holding Plavix. -Initiate aspirin 81 mg at discharge.  Chronic diastolic heart failure -Torsemide is currently on hold.  Volume status is stable.  -I discussed possibly reducing dose to 20 mg at discharge with daughter and having her monitor pt closely at home for increase in dyspnea, edema, wt. Adjust diuretic as needed.   SIRS  -Patient presented with fever, encephalopathy and weakness. -COVID negative testing.  Found to have bacteremia with h/o cat scratch. Patient is being treated with IV antibiotics and followed by infectious disease MD. No evidence of endocarditis on TEE.  -Clinical improvement.  -PICC line in place for home antibiotics. Plan for discharge today.   [facility] will sign off.   Medication Recommendations:  Continue to hold Xarelto and plavix. Start aspirin 81 mg. Resume home torsemide at reduce dose- 20 mg. Have reviewed with daughter to monitor wt, swelling, breathing.  Other recommendations (labs, testing, etc):  Per Primary.  Follow up as an outpatient:  Follow up in our office on 8/24.   For questions or updates, please contact [place] Please consult [email or web address] for contact info under        Signed, Daune Perch, NP  05/22/2019, 11:02 AM    I have seen and examined the patient along with Daune Perch, NP.  I have reviewed the chart, notes and new data.  I agree with PA/NP's  note.  Key new complaints: feels stronger Key examination changes: less pale Key new findings / data: Hgb stable, creat improving  PLAN: ASA 81 mg, stop Xarelto and clopidogrel. F/U arranged.  Sanda Klein, MD, [place] [phone] 05/22/2019, 1:19 PM

## 2019-05-22 NOTE — Discharge Summary (Addendum)
Physician Discharge Summary  Patient ID: [name] MRN: [id number] DOB/AGE: Oct 24, 1941 78 y.o.  Admit date: 05/14/2019 Discharge date: 05/22/2019  Admission Diagnoses:  SIRS  Acute encephalopathy  DM2 on insulin  CAD hx CABG  PAF  Chronic diastolic CHF  CKD IV  Anemia of chronic kidney diseaes  H/o PPM  Discharge Diagnoses:    Coag negative Staphylococcus bacteremia   CKD (chronic kidney disease) stage 4, GFR 15-29 ml/min (HCC)   Acute encephalopathy   Retroperitoneal hematoma   Paroxysmal atrial fibrillation (HCC)   Acute blood loss anemia   Anemia in chronic kidney disease   Essential hypertension   Fever   SIRS (systemic inflammatory response syndrome) (HCC)   Anemia of chronic disease   Insulin dependent diabetes mellitus (HCC)   Hyperlipidemia LDL goal <70   Chronic diastolic CHF (congestive heart failure) (HCC)   CAD S/P percutaneous coronary angioplasty   S/P placement of cardiac pacemaker   S/P CABG (coronary artery bypass graft), 12/04/11   SSS (sick sinus syndrome) ([place])   Discharged Condition: good  Hospital Course  Coag negative Staphylococcus bacteremia/ AMS: improved w/ gradual fever reduction on IV vanc - reported cat scratch at home - covid test negative x2 - with h/o pacemaker , negative TEE - ID recommending 2 weeks total IV Vanc 719m every 48 hrs through 05/28/19 (zyvox is not a good option due to thrombocytopenia) - PICC line placed yest for home IV abx - encephalopathy resolved - for dc home today  Anemia of ABL w/ acute L retroperitoneal bleed + anemia of chronic diease - Holding anticoagulation, holding plavix - CT abd/pel > "Large intramuscular hemorrhage which appears centered in the left iliacus muscle and to a lesser extent in the lower left quadratus lumborum muscle, with extension into the left retroperitoneum" - Case discussed with  General surgery on call Dr BNinfa Lindenwho recommends conservative management and IR consult - Case  discussed with IR Dr WPascal Luxwho recommended FFP in addition to prbc and holding anticoagulation.  He recommended conservative management for now as anticipate bleed will spontaneous stop. Patient is not a good candidate for CTA angiogram due to CKD. Should patient symptome persists, or become hemodynamically unstable , may consider angiogram to locate active extravasation and possible embolization - lowest Hb was 5.4 on 5/25, admit Hb 9.7 - got 2u prbc on 5/25 and another 2u prbc on 7/27 and Hb is stable now at 10.6 - Hb is now stable meaning the bleeding has abated, no further Rx needed, OK for dc home today   CKD- IV: creat 1.7- 2.5 here, baseline is 1.8- 2.4. stable.  - d/w patient's daughter today w/ regards to PICC line and CKD, daughter states her mother would "never do dialysis", I asked her to discuss w/ her renal MD on their next visit their wishes - OK for regular PICC line for IV abx at home -decreased gabapentin to 300 qd for CKD IV dosing  Paroxysmal atrial fibrillation ,H/o pacemaker on Xarelto , coreg at home - seen by cardiology here, appreciate rec's  > stop Xarelto -- they reprogrammed the pacemaker to immediately report any  episode of atrial arrhythmia > 6 minutes. If this occurs, they would use Eliquis  for what is generally perceived as a lower bleeding risk compared to Xarelto  > stop clopidogrel -- her stent was placed in elective fashion, not during acute  coronary intervention, >8 months ago  > start ASA 81 mg daily at discharge.  Chronic  diastolic CHF  torsemide held since admission, No sign of volume overload on exam. - will reduce torsemide to 20 mg qd at dc per cards rec's  Insulin dependent dm2 a1c 7.1 On lantus 22units daily at home , decreased to 10units daily due to poor oral intake Resume usual lantus 22u qd at dc  CAD status post CABG and stenting denies any chest pain.  - cont carvedilol ^'d 25 bid, statin - resume home Imdur 30 qd  Memory  impairment -possible mild baseline dementia, daughter reports patient is being followed by a neurologist  FTT: PT eval, home health    Discharge Exam: Blood pressure 139/79, pulse 60, temperature (!) 97.5 F (36.4 C), temperature source Oral, resp. rate 19, height 5' 1.5" (1.562 m), weight 66.1 kg, SpO2 97 %.  General:  Appear pale, NAD, very hard of hearing, aaox3  Cardiovascular: paced rhythm   Respiratory: CTABL  Abdomen: soft, left flank, left lower quadrant tenderness, positive BS  Musculoskeletal: No Edema  Neuro: AAox3  Disposition: Discharge disposition: 01-Home or Self Care       Discharge Instructions    Diet - low sodium heart healthy   Complete by: As directed    Carb modified diet   Home infusion instructions [facility] May follow [facility] Dosing Protocol; May administer Cathflo as needed to maintain patency of vascular access device.; Flushing of vascular access device: per [facility] Protocol: 0.9% NaCl pre/post medica...   Complete by: As directed    Instructions: May follow [facility] Dosing Protocol   Instructions: May administer Cathflo as needed to maintain patency of vascular access device.   Instructions: Flushing of vascular access device: per [facility] Protocol: 0.9% NaCl pre/post medication administration and prn patency; Heparin 100 u/ml, 42m for implanted ports and Heparin 10u/ml, 539mfor all other central venous catheters.   Instructions: May follow [facility] Anaphylaxis Protocol for First Dose Administration in the home: 0.9% NaCl at 25-50 ml/hr to maintain IV access for protocol meds. Epinephrine 0.3 ml IV/IM PRN and Benadryl 25-50 IV/IM PRN s/s of anaphylaxis.   Instructions: [facility] Coordinator (RN) to assist per patient IV care needs in the home PRN.   Increase activity slowly   Complete by: As directed      Allergies as of 05/22/2019      Reactions   Clonidine Derivatives Other (See Comments)   Bradycardia and fatigue    Sulfa Antibiotics Other (See Comments)   Unknown   Crestor [rosuvastatin Calcium] Other (See Comments)   Other reaction(s): tired and weak   Epinephrine Other (See Comments)   Abnormal feeling. Dental exam/injection of local w/ epi.   Hydralazine Other (See Comments)   Nausea/gi upset   Losartan Other (See Comments)   Hyperkalemia   Other Other (See Comments)   MANGO'S - WELTS ALL OVER      Medication List    STOP taking these medications   clopidogrel 75 MG tablet Commonly known as: Plavix   esomeprazole 20 MG capsule Commonly known as: NEXIUM   Xarelto 15 MG Tabs tablet Generic drug: Rivaroxaban     TAKE these medications   acetaminophen 650 MG CR tablet Commonly known as: TYLENOL Take 650 mg by mouth every 8 (eight) hours as needed for pain.   aspirin EC 81 MG tablet Take 1 tablet (81 mg total) by mouth daily.   carvedilol 25 MG tablet Commonly known as: COREG Take 1 tablet (25 mg total) by mouth 2 (two) times  daily. What changed:   medication strength  how much to take   docusate sodium 100 MG capsule Commonly known as: COLACE Take 100 mg by mouth 2 (two) times daily as needed for mild constipation or moderate constipation.   ferrous sulfate 325 (65 FE) MG EC tablet Take 325 mg by mouth daily with breakfast.   gabapentin 300 MG capsule Commonly known as: NEURONTIN Take 1 capsule (300 mg total) by mouth at bedtime. What changed: when to take this   isosorbide mononitrate 30 MG 24 hr tablet Commonly known as: IMDUR Take 1 tablet (30 mg total) by mouth daily.   Lantus SoloStar 100 UNIT/ML Solostar Pen Generic drug: Insulin Glargine Inject 22 Units into the skin daily.   meclizine 25 MG tablet Commonly known as: ANTIVERT Take 25 mg by mouth daily as needed for dizziness. For dizziness   nitroGLYCERIN 0.4 MG SL tablet Commonly known as: NITROSTAT Place 1 tablet (0.4 mg total) under the tongue every 5 (five) minutes as needed for chest pain.    PRESERVISION AREDS 2 PO Take 1 tablet by mouth every evening.   Rosuvastatin Calcium 10 MG Cpsp Take 10 mg by mouth daily.   Synthroid 88 MCG tablet Generic drug: levothyroxine Take 88 mcg by mouth daily before breakfast.   SYSTANE OP Place 1 drop into both eyes 2 (two) times daily.   torsemide 20 MG tablet Commonly known as: DEMADEX Take 1 tablet (20 mg total) by mouth daily. What changed:   how much to take  when to take this   vancomycin  IVPB Inject 750 mg into the vein every other day for 5 days. Indication:  CoNS Bacteremia  Last Day of Therapy:  05/29/2019 Labs - 'Sunday/Monday:  CBC/D, BMP, and vancomycin trough. Labs - Thursday:  BMP and vancomycin trough Labs - Every other week:  ESR and CRP Start taking on: May 24, 2019   VITAMIN B-12 PO Take 1 tablet by mouth 3 (three) times a week.   VITAMIN C PO Take 1 tablet by mouth 3 (three) times a week.            Home Infusion Instuctions  (From admission, onward)         Start     Ordered   05/22/19 0000  Home infusion instructions [facility] May follow [facility] Dosing Protocol; May administer Cathflo as needed to maintain patency of vascular access device.; Flushing of vascular access device: per [facility] Protocol: 0.9% NaCl pre/post medica...    Question Answer Comment  Instructions May follow [facility] Dosing Protocol   Instructions May administer Cathflo as needed to maintain patency of vascular access device.   Instructions Flushing of vascular access device: per [facility] Protocol: 0.9% NaCl pre/post medication administration and prn patency; Heparin 100 u/ml, 5ml for implanted ports and Heparin 10u/ml, 5ml for all other central venous catheters.   Instructions May follow [facility] Anaphylaxis Protocol for First Dose Administration in the home: 0.9% NaCl at 25-50 ml/hr to maintain IV access for protocol meds. Epinephrine 0.3 ml IV/IM PRN and Benadryl 25-50 IV/IM PRN s/s of anaphylaxis.   Instructions  [facility] Infusion Coordinator (RN) to assist per patient IV care needs in the home PRN.      07' /28/20 1140         Follow-up Information    Leeroy Cha, MD Follow up in 1 week(s).   Specialty: Internal Medicine Why: hospital disharge follow up, repeat cbc/bmp at follow up. Contact information:  [place] [address] [place] [address] [phone]        Constance Haw, MD .   Specialty: Cardiology Contact information: [place] [address] [phone]        [name], MD Follow up.   Specialty: Cardiology Why: Appointment with Almyra Deforest, PA on 06/18/2019 at 3:45.  Contact information: [place] [address] [phone]        Powers, Evern Core, MD Follow up.   Specialty: Infectious Diseases Why: please call Dr Prince Rome regarding questions related to home IV infusion and abx Contact information: [place] [address] [phone]           Signed: Sol Blazing 05/22/2019, 11:41 AM

## 2019-05-22 NOTE — Progress Notes (Signed)
PHARMACY CONSULT NOTE FOR:  OUTPATIENT  PARENTERAL ANTIBIOTIC THERAPY (OPAT)  Indication: CoNS Bacteremia Regimen: Vancomycin 750 mg IV q48 End date: 06/05/2019 - NOTE NEW STOP DATE (as per ID)   IV antibiotic discharge orders are pended. To discharging provider:  please sign these orders via discharge navigator,  Select New Orders & click on the button choice - Manage This Unsigned Work.     Thank you for allowing pharmacy to be a part of this patient's care.  Doreene Eland, PharmD, BCPS.   Work Cell: [phone] 05/22/2019 12:12 PM

## 2019-05-22 NOTE — Progress Notes (Signed)
Pharmacy Antibiotic Note  [name] is a 78 y.o. female with hx CKD presented to the ED on 7/20 with c/o generalized weakness, fever and confusion. She's currently on vancomycin for CoNS bacteremia (s/p TEE on 7/23- negative for  Vegetation).   Today, 05/22/2019: - day 7/14 of abx - ID recommends to treat for 2 weeks - Afebrile, WBC stable WNL - SCr 1.9 - VT = 14, acceptable - PICC placed 7/27   Plan: - Continue vancomycin 750 mg IV q48h   OUTPATIENT  PARENTERAL ANTIBIOTIC THERAPY (OPAT)  Indication: CoNS Bacteremia Regimen: Vancomycin 750 mg IV q48 End date: 05/29/2019  IV antibiotic discharge orders are pended. To discharging provider:  please sign these orders via discharge navigator,  Select New Orders & click on the button choice - Manage This Unsigned Work.     Height: 5' 1.5" (156.2 cm) Weight: 145 lb 11.6 oz (66.1 kg) IBW/kg (Calculated) : 48.95  Temp (24hrs), Avg:98.2 F (36.8 C), Min:97.5 F (36.4 C), Max:98.7 F (37.1 C)  Recent Labs  Lab 05/19/19 0511  05/20/19 0211 05/20/19 0433 05/20/19 0917 05/20/19 1415 05/21/19 0228 05/21/19 1959 05/22/19 0211 05/22/19 0930  WBC 7.6   < >  --  8.6  --  8.9 8.8 9.8 10.5  --   CREATININE 2.11*  --  2.51*  --   --  2.34* 2.14*  --   --  1.90*  VANCOTROUGH  --   --   --   --  14*  --   --   --   --  14*   < > = values in this interval not displayed.    Estimated Creatinine Clearance: 21.1 mL/min (A) (by C-G formula based on SCr of 1.9 mg/dL (H)).    Allergies  Allergen Reactions  . Clonidine Derivatives Other (See Comments)    Bradycardia and fatigue   . Sulfa Antibiotics Other (See Comments)    Unknown  . Crestor [Rosuvastatin Calcium] Other (See Comments)    Other reaction(s): tired and weak  . Epinephrine Other (See Comments)    Abnormal feeling. Dental exam/injection of local w/ epi.  Marland Kitchen Hydralazine Other (See Comments)    Nausea/gi upset   . Losartan Other (See Comments)    Hyperkalemia   .  Other Other (See Comments)    MANGO'S - WELTS ALL OVER   Antimicrobials this admission: 7/20 Rocephin>>7/22. Received 2 doses for UTI 7/22 Vancomycin>>  Dose adjustments this admission: 7/24: Vanc 750mg  q48 > AUC 512 with SCr 2.32, with decreased SCr 1.73 > AUC 410 - dose 1gm q48 > AUC 547, will cont 750mg  q48 7/26 VT = 14, continue 750 q48 7/28 VT = 14  Microbiology results: 7/20BCx: BCID 7/21 resulted in 1 of 4 bottles with Strep sp, MR-CoNS.  Strep spp never grew on cx, just on BCID 7/20UCx: insignificant growth- final 7/22 rpt BCx x2: ngF  Thank you for allowing pharmacy to be a part of this patient's care.  Eudelia Bunch, Pharm.D [phone] 05/22/2019 10:02 AM

## 2019-05-23 LAB — CUP PACEART REMOTE DEVICE CHECK
Date Time Interrogation Session: 20200729082212
Implantable Lead Implant Date: 20171019
Implantable Lead Implant Date: 20171019
Implantable Lead Location: 753859
Implantable Lead Location: 753860
Implantable Pulse Generator Implant Date: 20171019
Pulse Gen Model: 2272
Pulse Gen Serial Number: 3180458

## 2019-06-04 ENCOUNTER — Encounter: Payer: Self-pay | Admitting: Cardiology

## 2019-06-04 NOTE — Progress Notes (Signed)
Remote pacemaker transmission.   

## 2019-06-18 ENCOUNTER — Other Ambulatory Visit: Payer: Self-pay

## 2019-06-18 ENCOUNTER — Encounter: Payer: Self-pay | Admitting: Physician Assistant

## 2019-06-18 ENCOUNTER — Ambulatory Visit (INDEPENDENT_AMBULATORY_CARE_PROVIDER_SITE_OTHER): Payer: Medicare Other | Admitting: General Practice

## 2019-06-18 VITALS — BP 140/62 | HR 62 | Ht 61.0 in | Wt 133.6 lb

## 2019-06-18 DIAGNOSIS — I4892 Unspecified atrial flutter: Secondary | ICD-10-CM

## 2019-06-18 DIAGNOSIS — D62 Acute posthemorrhagic anemia: Secondary | ICD-10-CM | POA: Diagnosis not present

## 2019-06-18 DIAGNOSIS — I25709 Atherosclerosis of coronary artery bypass graft(s), unspecified, with unspecified angina pectoris: Secondary | ICD-10-CM | POA: Diagnosis not present

## 2019-06-18 DIAGNOSIS — I5032 Chronic diastolic (congestive) heart failure: Secondary | ICD-10-CM

## 2019-06-18 NOTE — Patient Instructions (Signed)
Medication Instructions:  Your physician recommends that you continue on your current medications as directed. Please refer to the Current Medication list given to you today.   If you need a refill on your cardiac medications before your next appointment, please call your pharmacy.   Lab work: You will need to have labs (blood work) drawn today:  CBC  CMET  If you have labs (blood work) drawn today and your tests are completely normal, you will receive your results only by: Marland Kitchen MyChart Message (if you have MyChart) OR . A paper copy in the mail If you have any lab test that is abnormal or we need to change your treatment, we will call you to review the results.  Testing/Procedures: NONE ordered at this time of appointment   Follow-Up: At [facility], you and your health needs are our priority.  As part of our continuing mission to provide you with exceptional heart care, we have created designated Provider Care Teams.  These Care Teams include your primary Cardiologist (physician) and Advanced Practice Providers (APPs -  Physician Assistants and Nurse Practitioners) who all work together to provide you with the care you need, when you need it. You will need a follow up appointment in 2 months.  Please call our office 2 months in advance to schedule this appointment.  You may see [name], MD or one of the following Advanced Practice Providers on your designated Care Team: [place], [place] . Fabian Sharp, PA-C  Any Other Special Instructions Will Be Listed Below (If Applicable).

## 2019-06-18 NOTE — Progress Notes (Signed)
Cardiology Clinic Note   Patient Name: [name] Date of Encounter: 06/18/2019  Primary Care Provider:  Leeroy Cha, MD Primary Cardiologist:  [name], MD  Patient Profile    [name]. [age] year old female presents today for follow-up for coronary artery disease, paroxysmal a.fib, and acute blood loss anemia.  Past Medical History    Past Medical History:  Diagnosis Date   Anemia    Anxiety    Arthritis    "in my hands; knees, back" (09/27/2018)   CAD (coronary artery disease)    a. 40-59% bilaterally 10/2015.   Chronic diastolic CHF (congestive heart failure) ([place])    a. 05/2016 Echo: EF 60-65%, no rwma, Gr1 DD, Ao sclerosis w/o stenosis, triv MR;  b. 07/2016 TEE: EF 55-60%, no rwma, mild MR.   CKD (chronic kidney disease), stage III (HCC)    Coronary artery disease    a. 02/2007 Persantine MV: low risk;  b. 11/2011 CABG x 3 (LIMA->LAD, VG->OM, VG->RCA);  c. 05/2016 MV: EF >65%, no isch/infarct, horiz ST dep in I, II, V5-V6.   Depression    Diverticulosis    Esophageal stricture    GERD (gastroesophageal reflux disease)    Hemorrhoids    Hiatal hernia    Hyperkalemia    a. ARB stopped due to this.   Hyperlipidemia    Hypertension    Hypertensive heart disease    Hypothyroidism    Mild cognitive impairment    a. seen by neurology.   Myocardial infarction ([place]) 12/07/2011   PAF (paroxysmal atrial fibrillation) ([place])    a. post-op CABG.   Pain    RIGHT KNEE PAIN - TORN RIGHT MEDIAL MENISCUS   Paroxysmal atrial flutter ([place])    a. 07/2016 s/p TEE & DCCV;  b. 07/2016 Recurrent PAFlutter req initiation of amio & PPM in setting of tachy-brady;  c. CHA2DS2VASc = 7-->Xarelto 15 mg QD.   Pneumonia    "twice" (09/27/2018)   PONV (postoperative nausea and vomiting)    Presence of permanent cardiac pacemaker 08/12/2016   S/P CABG (coronary artery bypass graft), 12/04/11 12/07/2011   LIMA to LAD, SVG to OM, SVG to RCA    Sinus bradycardia    a. not on BB due to this.   Skin cancer    "face" (09/27/2018)   Tachy-brady syndrome ([place])    a. 07/2016 Jxnl brady following DCCV, recurrent Aflutter-->amio + SJM 2272 Assurity MRI DC PPM (ser # [id number]).   Type II diabetes mellitus ([place])    Past Surgical History:  Procedure Laterality Date   ABDOMINAL HYSTERECTOMY  1980's   ANKLE FRACTURE SURGERY Right    "put pins both side right ankle"   BACK SURGERY  2006   "cyst growing near my spine"   CARDIAC CATHETERIZATION  12/02/2011   mild LV dysfunction with mod hypocontractility of mid-distal anterolateral wall; CAD w/ostial tapering of L [place] with 50% diffuse ostial narrowing of LAD, 99% eccentric focal prox LAD stenosis followed by 70% prox LAD stenosis after 1st diag, 20% mid LAD narrowing; 80% ostial-to-prox L Cfx stenosis & 40-50% irregularity of RCA (Dr. Corky Downs)   CARDIOVERSION N/A 08/11/2016   Procedure: CARDIOVERSION;  Surgeon: Lelon Perla, MD;  Location: [facility];  Service: Cardiovascular;  Laterality: N/A;   CATARACT EXTRACTION W/ INTRAOCULAR LENS  IMPLANT, BILATERAL Bilateral ~ 2010   [place] GRAFT  12/04/2011   Procedure: CORONARY ARTERY BYPASS GRAFTING (CABG);  Surgeon:  Len Childs, MD;  Location: [facility];  Service: Open Heart Surgery;  Laterality: N/A;  CABG x three,  using left internal mammary artery, and right leg greater saphenous vein harvested endoscopically   CORONARY STENT INTERVENTION N/A 09/27/2018   Procedure: CORONARY STENT INTERVENTION;  Surgeon: Troy Sine, MD;  Location: [facility];  Service: Cardiovascular;  Laterality: N/A;   DILATION AND CURETTAGE OF UTERUS     "a couple times"   EP IMPLANTABLE DEVICE N/A 08/12/2016   Procedure: Pacemaker Implant;  Surgeon: Will Meredith Leeds, MD;  Location: [facility];  Service: Cardiovascular;  Laterality: N/A;   ESOPHAGOGASTRODUODENOSCOPY (EGD) WITH ESOPHAGEAL  DILATION     FRACTURE SURGERY     JOINT REPLACEMENT     KNEE ARTHROSCOPY WITH MEDIAL MENISECTOMY Right 07/02/2014   Procedure: RIGHT KNEE ARTHROSCOPY WITH PARTIAL MEDIAL MENISTECTOMY, ABRASION CONDROPLASTYU OF PATELLA,ABRASION CONDROPLASTY OF MEDIAL FEMEROL CONDYL, MICROFRACTURE OF MEDIAL FEMEROL CONDYL;  Surgeon: Tobi Bastos, MD;  Location: [facility];  Service: Orthopedics;  Laterality: Right;   LEFT HEART CATH AND CORS/GRAFTS ANGIOGRAPHY N/A 09/06/2018   Procedure: LEFT HEART CATH AND CORS/GRAFTS ANGIOGRAPHY;  Surgeon: Troy Sine, MD;  Location: [facility];  Service: Cardiovascular;  Laterality: N/A;   LEFT HEART CATHETERIZATION WITH CORONARY ANGIOGRAM N/A 12/02/2011   Procedure: LEFT HEART CATHETERIZATION WITH CORONARY ANGIOGRAM;  Surgeon: Troy Sine, MD;  Location: [facility] CATH LAB;  Service: Cardiovascular;  Laterality: N/A;  Coronary angiogram, possible PCI   TEE WITHOUT CARDIOVERSION N/A 08/11/2016   Procedure: TRANSESOPHAGEAL ECHOCARDIOGRAM (TEE);  Surgeon: Lelon Perla, MD;  Location: [facility] ENDOSCOPY;  Service: Cardiovascular;  Laterality: N/A;   TEE WITHOUT CARDIOVERSION N/A 05/18/2019   Procedure: TRANSESOPHAGEAL ECHOCARDIOGRAM (TEE);  Surgeon: [name], MD;  Location:  [facility] ENDOSCOPY;  Service: Cardiovascular;  Laterality: N/A;   [place] Left ~ 2006   TRANSTHORACIC ECHOCARDIOGRAM  02/19/2013   EF 01-02%, grade 1 diastolic dysfunction; mildly thickend/calcified AV leaflets; mildly calcidied MV annulus; mild TR    Allergies  Allergies  Allergen Reactions   Clonidine Derivatives Other (See Comments)    Bradycardia and fatigue    Sulfa Antibiotics Other (See Comments)    Unknown   Crestor [Rosuvastatin Calcium] Other (See Comments)    Other reaction(s): tired and weak   Epinephrine Other (See Comments)    Abnormal feeling. Dental exam/injection of local w/ epi.   Hydralazine Other (See Comments)    Nausea/gi  upset    Losartan Other (See Comments)    Hyperkalemia    Other Other (See Comments)    MANGO'S - WELTS ALL OVER    History of Present Illness    [name] was last seen by Dr. Sallyanne Kuster on 05/22/2019, in preparation for discharge after a bout of bacteremia in the presence of cat scratch fever.  She underwent TEE that showed no evidence of endocarditis, and her PICC line was still in place for home antibiotics.  During this admission she was also found to be anemic due to acute blood loss anemia (found to have intramuscular hemorrhage with extension into the left retroperitoneum), her hemoglobin had dipped to 5.4, she received 2 units PRBCs, 1 FFP and her hemoglobin at discharge was 10.6.  Her pacemaker was programmed to report any episodes of atrial arrhythmia greater than 6 minutes.  With plan to initiate Eliquis in the event of atrial arrhythmia.  Plavix was also held and 81 mg  aspirin was resumed.  She had underwent CABGx3 in 2013, and PCI on 09/27/2018 with stenting SVG to OM however given that it was more than 8 months since the intervention Plavix was held.  Her PMH also includes essential hypertension, non-STEMI, presyncope, coronary artery disease, paroxysmal atrial flutter, chronic diastolic CHF, accelerated hypertension, tachybradycardia syndrome, sick sinus syndrome, paroxysmal atrial fibrillation, GERD, esophageal stricture, diabetic peripheral neuropathy, acute encephalopathy, CKD stage IV, memory loss, acute anemia, SIRS, and anemia and chronic kidney disease.  She presents to the clinic today with her daughter and states that she has been doing well since she was discharged from the hospital on 05/22/2019.  She feels she is not quite back to her baseline as far as energy and strength are concerned.  Her daughter would like to know if the carvedilol 25 mg twice daily can be reduced.  She states she is back doing mainly all of her daily activities.  [name] still lives alone.  She  denies any falls or gait instability.  She denies chest pain, shortness of breath, lower extremity edema, fatigue, palpitations, melena, hematuria, hemoptysis, diaphoresis, weakness, presyncope, syncope, orthopnea, and PND.   Home Medications    Prior to Admission medications   Medication Sig Start Date End Date Taking? Authorizing Provider  acetaminophen (TYLENOL) 650 MG CR tablet Take 650 mg by mouth every 8 (eight) hours as needed for pain.    [provider]  Ascorbic Acid (VITAMIN C PO) Take 1 tablet by mouth 3 (three) times a week.    [provider]  aspirin EC 81 MG tablet Take 1 tablet (81 mg total) by mouth daily. 05/22/19 08/20/19  Roney Jaffe, MD  carvedilol (COREG) 25 MG tablet Take 1 tablet (25 mg total) by mouth 2 (two) times daily. 05/22/19   Roney Jaffe, MD  Cyanocobalamin (VITAMIN B-12 PO) Take 1 tablet by mouth 3 (three) times a week.     [provider]  docusate sodium (COLACE) 100 MG capsule Take 100 mg by mouth 2 (two) times daily as needed for mild constipation or moderate constipation.    [provider]  ferrous sulfate 325 (65 FE) MG EC tablet Take 325 mg by mouth daily with breakfast.    [provider]  gabapentin (NEURONTIN) 300 MG capsule Take 1 capsule (300 mg total) by mouth at bedtime. 05/22/19   Roney Jaffe, MD  isosorbide mononitrate (IMDUR) 30 MG 24 hr tablet Take 1 tablet (30 mg total) by mouth daily. Patient not taking: Reported on 05/14/2019 01/22/19 04/22/19  [name], MD  LANTUS SOLOSTAR 100 UNIT/ML Solostar Pen Inject 22 Units into the skin daily. 08/08/18   [provider]  levothyroxine (SYNTHROID) 88 MCG tablet Take 88 mcg by mouth daily before breakfast.     [provider]  meclizine (ANTIVERT) 25 MG tablet Take 25 mg by mouth daily as needed for dizziness. For dizziness    [provider]  Multiple Vitamins-Minerals (PRESERVISION AREDS 2 PO) Take 1 tablet by  mouth every evening.     [provider]  nitroGLYCERIN (NITROSTAT) 0.4 MG SL tablet Place 1 tablet (0.4 mg total) under the tongue every 5 (five) minutes as needed for chest pain. 08/15/18 05/14/19  Lendon Colonel, NP  Polyethyl Glycol-Propyl Glycol (SYSTANE OP) Place 1 drop into both eyes 2 (two) times daily.     [provider]  Rosuvastatin Calcium 10 MG CPSP Take 10 mg by mouth daily.     [provider]  torsemide (DEMADEX) 20 MG tablet Take 1 tablet (20 mg total) by mouth daily. 05/22/19   Roney Jaffe, MD    Family History    Family History  Problem Relation Age of Onset   Diabetes Mother    CVA Mother    Hypertension Mother    Heart disease Father    Hyperlipidemia Father    Breast cancer Sister        x 3   Heart disease Brother        x5; one with MI   Heart disease Sister        x3   Diabetes Sister        x3   Lung cancer Sister    Breast cancer Sister        x2   Colon cancer Neg Hx    She indicated that her mother is deceased. She indicated that her father is deceased. She reported the following about one of her sisters: 63 brothers and sisters. She indicated that the status of her brother is unknown. She indicated that her daughter is alive. She indicated that her son is alive. She indicated that the status of her neg hx is unknown.  Social History    Social History   Socioeconomic History   Marital status: Widowed    Spouse name: Not on file   Number of children: 2   Years of education: 50   Highest education level: Not on file  Occupational History   Occupation: retired  Scientist, product/process development strain: Not on file   Food insecurity    Worry: Not on file    Inability: Not on Lexicographer needs    Medical: Not on file    Non-medical: Not on file  Tobacco Use   Smoking status: Never Smoker   Smokeless tobacco: Never Used  Substance and Sexual Activity   Alcohol use: Not  Currently    Comment: very occasional    Drug use: Never   Sexual activity: Not Currently  Lifestyle   Physical activity    Days per week: Not on file    Minutes per session: Not on file   Stress: Not on file  Relationships   Social connections    Talks on phone: Not on file    Gets together: Not on file    Attends religious service: Not on file    Active member of club or organization: Not on file    Attends meetings of clubs or organizations: Not on file    Relationship status: Not on file   Intimate partner violence    Fear of current or ex partner: Not on file    Emotionally abused: Not on file    Physically abused: Not on file    Forced sexual activity: Not on file  Other Topics Concern   Not on file  Social History Narrative   Not on file     Review of Systems    General:  No chills, fever, night sweats or weight changes.  Cardiovascular:  No chest pain, dyspnea on exertion, edema, orthopnea, palpitations, paroxysmal nocturnal dyspnea. Dermatological: No rash, lesions/masses Respiratory: No cough, dyspnea Urologic: No hematuria, dysuria Abdominal:   No nausea, vomiting, diarrhea, bright red blood per rectum, melena, or hematemesis Neurologic:  No visual changes, wkns, changes in mental status. All other systems reviewed and are otherwise negative except as noted above.  Physical Exam    VS:  BP 140/62    Pulse 62    Ht 5\' 1"  (1.549 m)    Wt 133 lb 9.6 oz (60.6 kg)    SpO2 97%    BMI 25.24 kg/m  , BMI Body mass index is 25.24 kg/m. GEN: Well nourished, well developed, in no acute distress. HEENT: normal. Neck: Supple, no JVD, carotid bruits, or masses. Cardiac:RRR, no murmurs, rubs, or gallops. No clubbing, cyanosis, edema.  Radials/DP/PT 2+ and equal bilaterally.  Respiratory:  Respirations regular and unlabored, clear to auscultation bilaterally. GI: Soft, nontender, nondistended, BS + x 4. MS: no deformity or atrophy. Skin: warm and dry, no  rash. Neuro:  Strength and sensation are intact. Psych: Normal affect.  Accessory Clinical Findings    ECG personally reviewed by me today-none today  TEE 05/18/2019 IMPRESSIONS    1. The left ventricle has normal systolic function, with an ejection fraction of 55-60%. The cavity size was normal. No evidence of left ventricular regional wall motion abnormalities.  2. Left atrial size was moderately dilated.  3. Right atrial size was mildly dilated.  4. The mitral valve is abnormal. Mild thickening of the mitral valve leaflet.  5. The tricuspid valve was grossly normal. Tricuspid valve regurgitation is mild-moderate.  6. The aortic valve is tricuspid Mild sclerosis of the aortic valve. Aortic valve regurgitation is trivial by color flow Doppler. No stenosis of the aortic valve.  7. The interatrial septum appears to be lipomatous.  Echocardiogram 05/17/2019 IMPRESSIONS    1. The left ventricle has normal systolic function, with an ejection fraction of 55-60%. The cavity size was normal. Left ventricular diastolic Doppler parameters are consistent with pseudonormalization. Elevated mean left atrial pressure There is  abnormal septal motion consistent with post-operative status. No evidence of left ventricular regional wall motion abnormalities.  2. The right ventricle has mildly reduced systolic function. The cavity was normal. There is no increase in right ventricular wall thickness. Right ventricular systolic pressure is mildly elevated with an estimated pressure of 44.0 mmHg.  3. Left atrial size was moderately dilated.  4. Mild thickening of the mitral valve leaflet. The MR jet is centrally-directed.  5. Tricuspid valve regurgitation is mild-moderate.  6. The aorta is normal in size and structure.  Cardiac catheterization 09/27/2018  Prior to intervention, the saphenous graft supplying the circumflex marginal vessel had a 99% ostial/proximal stenosis with thrombus, followed by 70%  proximal stenosis on a bend in the vessel.  There was 30% mild irregularity in the midportion of the graft with mild ectasia in the distal aspect of the graft prior to insertion into the marginal vessel.  Following successful intervention with PTCA and insertion of synergy 2.25 x 12 mm stents both ostially and proximally, both lesions were reduced to 0%.  There was brisk TIMI-3 flow.  There was no evidence for dissection.  RECOMMENDATION: Continued medical therapy for concomitant CAD.  Recommend resumption of Xarelto tomorrow with plans for triple drug therapy for 1 month and then change to Plavix/Xarelto for at least 6 months with then resumption of aspirin/Xarelto.   Assessment & Plan   1.  Acute blood loss anemia- found to have intermuscular hemorrhage with extension into the left retro-peritoneum during 7/20 admission.  She received PRBCs x2, FFP x1 on 7/25, bringing hemoglobin from 5.4-10.6. CBC and BMP today Continue 81 mg aspirin We will discuss with Dr. Debara Pickett about proceeding with anticoagulate.  2.  Paroxysmal atrial fibrillation-PPM for tachybradycardia syndrome 2017 -Holding anticoagulation until CBC and MD recommendation  Order CBC Continue 81 mg aspirin Continue carvedilol 25 mg twice daily  3.  Coronary artery disease- CABG x3, 2013, PCI with stenting SVG to OM in December 2019, not associated with ACS Plavix on hold until CBC result-no active bleeding, bruising, melena Continue 81 mg aspirin Continue carvedilol 25 mg twice daily  4.  Chronic diastolic CHF-echocardiogram 04/2019 LVEF 55 to 45%, diastolic dysfunction, left atrium moderately dilated.  Weight today 133.6 pounds down from 145.7 pounds on 05/22/2019. Continue torsemide 20 mg tablet daily Continue carvedilol 25 mg tablet twice daily Low-sodium heart healthy diet Increase physical activity as tolerated  Disposition: Follow-up with Dr. Debara Pickett in 2 months.  Deberah Pelton, NP 06/18/2019, 4:50 PM

## 2019-06-18 NOTE — Progress Notes (Signed)
Thanks Denyse Amass - would just continue low dose aspirin for now - if there are alerts for recurrent afib, we may consider restarting Eliquis in the future.  -[place]

## 2019-06-19 ENCOUNTER — Telehealth (INDEPENDENT_AMBULATORY_CARE_PROVIDER_SITE_OTHER): Payer: Medicare Other | Admitting: General Practice

## 2019-06-19 DIAGNOSIS — Z79899 Other long term (current) drug therapy: Secondary | ICD-10-CM

## 2019-06-19 NOTE — Telephone Encounter (Signed)
Patients daughter contacted about Eliquis/ Plavix question that was discuss yesterday (06/18/2019). Patient has a PPM that has been set to alert if there is an atrial arrhythmia that is greater than 6 minutes. We will continue her 81 mg Asprin at this time and not initiate any other medications at this time. Patient has a Nephrology appointment today and is schedued for lab work. Our lab had closed so she was not able to have lab work done during her 06/18/2019 visit. All of the patients questions were answered and she expressed understanding.

## 2019-07-30 NOTE — Progress Notes (Signed)
Virtual Visit via Video Note The purpose of this virtual visit is to provide medical care while limiting exposure to the novel coronavirus.    Consent was obtained for video visit:  Yes.   Answered questions that patient had about telehealth interaction:  Yes.   I discussed the limitations, risks, security and privacy concerns of performing an evaluation and management service by telemedicine. I also discussed with the patient that there may be a patient responsible charge related to this service. The patient expressed understanding and agreed to proceed.  Pt location: Home Physician Location: home Name of referring provider:  Leeroy Cha,* I connected with Gelene Mink at patients initiation/request on 08/01/2019 at 10:15 AM EDT by video enabled telemedicine application and verified that I am speaking with the correct person using two identifiers. Pt MRN:  [id number] Pt DOB:  April 30, 1941 Video Participants:  Gelene Mink;  daughter present and supplements the history   History of Present Illness:  Patient seen today in follow-up for memory change.  I have not seen her in about 2-1/2 years.  I have followed her over the years, however, for this same complaint.  She started seeing me in 2016 for this.  She has had neurocognitive testing, the last time in 2017, demonstrating mild cognitive impairment, as well as depression contributing.  When I last saw her, in March, 2018, she declined repeating neurocognitive testing.  Daughter states that this memory issue has gotten worse.  Pt reports "I can remember things I was from a teenager but cannot remember dates of lately."  Patient currently lives by herself.  She takes care of her monthly finances but daughter states that she has found bills laying around that are late or misplaced and that has been going on for a year.  Daughter fills her medication box and daughter notes that there is time that she has forgotten to take her  medication.  Daughter has done medication 1-2 years.  Daughter took that over because she was taking a lot of medications.  Medication regimen has been simplified b/c she got a PPM and so didn't need as much medication. She does cook an egg for breakfast and doesn't have trouble for that.  She cooks a dinner meal 2 days a week.  She doesn't have trouble to remember to turn the water/stove off and has no trouble to remember the recipes.  She does drive on a limited basis but the insurance lapsed on her car so not driving right now.  She does not have hallucinations.  She did have a history of carotid stenosis.  Our last repeated her carotid ultrasound in March, 2018.  This actually did not show the significant stenosis that was seen prior.  It did show that there was a moderate amount of plaque in the left carotid bulb, extending to and involving the proximal left internal carotid artery.  Recent medical records have been reviewed.  She was in the hospital at the end of July with bacteremia causing altered mental status.  Her encephalopathy had resolved once that was treated, before she left the hospital.  She also had an acute blood loss due to a large intramuscular hemorrhage in the iliac [place] muscle on the left.  Her Xarelto was discontinued, as was her Plavix.  She was started on 81 mg aspirin.  She had a CT brain in 04/2019 and I personally reviewed this.  There was small vessel disease and atrophy.   Current Outpatient Medications on  File Prior to Visit  Medication Sig Dispense Refill   acetaminophen (TYLENOL) 650 MG CR tablet Take 650 mg by mouth every 8 (eight) hours as needed for pain.     Ascorbic Acid (VITAMIN C PO) Take 1 tablet by mouth 3 (three) times a week.     aspirin EC 81 MG tablet Take 1 tablet (81 mg total) by mouth daily. 90 tablet 0   carvedilol (COREG) 25 MG tablet Take 1 tablet (25 mg total) by mouth 2 (two) times daily. 60 tablet 3   Cyanocobalamin (VITAMIN B-12 PO) Take 1  tablet by mouth 3 (three) times a week.      docusate sodium (COLACE) 100 MG capsule Take 100 mg by mouth 2 (two) times daily as needed for mild constipation or moderate constipation.     ferrous sulfate 325 (65 FE) MG EC tablet Take 325 mg by mouth daily with breakfast.     gabapentin (NEURONTIN) 300 MG capsule Take 1 capsule (300 mg total) by mouth at bedtime. 30 capsule 2   LANTUS SOLOSTAR 100 UNIT/ML Solostar Pen Inject 22 Units into the skin daily.  6   levothyroxine (SYNTHROID) 88 MCG tablet Take 88 mcg by mouth daily before breakfast.      meclizine (ANTIVERT) 25 MG tablet Take 25 mg by mouth daily as needed for dizziness. For dizziness     Multiple Vitamins-Minerals (PRESERVISION AREDS 2 PO) Take 1 tablet by mouth every evening.      Polyethyl Glycol-Propyl Glycol (SYSTANE OP) Place 1 drop into both eyes 2 (two) times daily.      Rosuvastatin Calcium 10 MG CPSP Take 10 mg by mouth daily.      torsemide (DEMADEX) 20 MG tablet Take 1 tablet (20 mg total) by mouth daily. 30 tablet 2   No current facility-administered medications on file prior to visit.      Observations/Objective:   Vitals:   08/01/19 0848  Weight: 130 lb (59 kg)  Height: 5\' 2"  (1.575 m)   GEN:  The patient appears stated age and is in NAD.  Neurological examination:  Orientation:  Montreal Cognitive Assessment  08/01/2019  Visuospatial/ Executive (0/5) 2  Naming (0/3) 3  Attention: Read list of digits (0/2) 2  Attention: Read list of letters (0/1) 1  Attention: Serial 7 subtraction starting at 100 (0/3) 1  Language: Repeat phrase (0/2) 1  Language : Fluency (0/1) 0  Abstraction (0/2) 0  Delayed Recall (0/5) 3  Orientation (0/6) 6  Total 19  Adjusted Score (based on education) 20   Cranial nerves: There is good facial symmetry. There is no facial hypomimia.  The speech is fluent and clear. Soft palate rises symmetrically and there is no tongue deviation. Hearing is intact to conversational  tone. Motor: Strength is at least antigravity x 4.   Shoulder shrug is equal and symmetric.  There is no pronator drift.  Movement examination: Tone: unable Abnormal movements: none Coordination:  There is no decremation with RAM's, with any form of RAMS, including alternating supination and pronation of the forearm, hand opening and closing, finger taps, heel taps and toe taps. Gait and Station: The patient has no difficulty arising out of a deep-seated chair without the use of the hands. The patient's stride length is good but she drags the R leg a little.    Lab Results  Component Value Date   QJJHERDE08 144 05/19/2019     Chemistry      Component Value Date/Time  NA 138 05/21/2019 0228   NA 139 10/09/2018 0947   K 4.5 05/21/2019 0228   CL 108 05/21/2019 0228   CO2 19 (L) 05/21/2019 0228   BUN 54 (H) 05/21/2019 0228   BUN 62 (H) 10/09/2018 0947   CREATININE 1.90 (H) 05/22/2019 0930   CREATININE 1.59 (H) 09/22/2016 1202      Component Value Date/Time   CALCIUM 7.8 (L) 05/21/2019 0228   ALKPHOS 67 05/15/2019 0500   AST 32 05/15/2019 0500   ALT 17 05/15/2019 0500   BILITOT 0.8 05/15/2019 0500   BILITOT 0.2 10/31/2017 1108        Assessment and Plan:   1.  Memory change             -Had neuropsych testing in 12/2015.  [facility] only noted.    Memory change has progressed since that time.  Daughter is trying to tap into her long-term care insurance, and needs to see if other things have developed.  We will go ahead and proceed with neurocognitive testing.  -Patient just had a recent permanent pacemaker placed.  Her daughter thinks it is MRI compatible, but ultimately they decided to hold on that.  Patient was not keen at all about even having the neurocognitive testing, so certainly was not happy about having an MRI.  She did have a CT of the head in July, 2020 that I reviewed, and gave them the results of this as well. 2.  Diabetic peripheral neuropathy             -This is fairly  significant on her examination.  It has gotten worse with time.  She lives in [place].  Talked about [facility] [place] for balance therapy.  She asks several times if she to pay a copay and I told her that I didn't know and we would need to refer.  She let us do that.  I am not sure she is going to follow through with therapy, but I certainly will send a referral and encouraged her to not only go to therapy, but also to do safe exercises once therapy has been completed 3.  L carotid bruit             - last scan was done in 2018 and demonstrated some plaque on the left, but no hemodynamically significant stenosis.  4.    Patient will follow-up with me depending on the results of the above.    Follow Up Instructions:    -I discussed the assessment and treatment plan with the patient. The patient was provided an opportunity to ask questions and all were answered. The patient agreed with the plan and demonstrated an understanding of the instructions.   The patient was advised to call back or seek an in-person evaluation if the symptoms worsen or if the condition fails to improve as anticipated.    Total Time spent in visit with the patient was:  26 min, of which more than 50% of the time was spent in counseling.   Pt understands and agrees with the plan of care outlined.     Alonza Bogus, DO

## 2019-08-01 ENCOUNTER — Telehealth (INDEPENDENT_AMBULATORY_CARE_PROVIDER_SITE_OTHER): Payer: Medicare Other | Admitting: Neurology

## 2019-08-01 ENCOUNTER — Other Ambulatory Visit: Payer: Self-pay

## 2019-08-01 ENCOUNTER — Encounter: Payer: Self-pay | Admitting: Neurology

## 2019-08-01 VITALS — Ht 62.0 in | Wt 130.0 lb

## 2019-08-01 DIAGNOSIS — R413 Other amnesia: Secondary | ICD-10-CM

## 2019-08-01 NOTE — Addendum Note (Signed)
Addended by: Ranae Plumber on: 08/01/2019 11:59 AM   Modules accepted: Orders

## 2019-08-15 ENCOUNTER — Other Ambulatory Visit: Payer: Self-pay

## 2019-08-15 ENCOUNTER — Ambulatory Visit: Payer: Medicare Other | Admitting: Internal Medicine

## 2019-08-15 VITALS — BP 144/48 | HR 60 | Ht 60.0 in | Wt 132.0 lb

## 2019-08-15 DIAGNOSIS — I5032 Chronic diastolic (congestive) heart failure: Secondary | ICD-10-CM

## 2019-08-15 DIAGNOSIS — I4892 Unspecified atrial flutter: Secondary | ICD-10-CM

## 2019-08-15 DIAGNOSIS — Z950 Presence of cardiac pacemaker: Secondary | ICD-10-CM

## 2019-08-15 DIAGNOSIS — I10 Essential (primary) hypertension: Secondary | ICD-10-CM

## 2019-08-15 DIAGNOSIS — E785 Hyperlipidemia, unspecified: Secondary | ICD-10-CM

## 2019-08-15 NOTE — Progress Notes (Signed)
OFFICE NOTE  Chief Complaint:  Routine follow-up  Primary Care Physician: Leeroy Cha, MD  HPI:  [name] is a pleasant 78 year old female with history of coronary disease and CABG x 3 vessels in 2013, LIMA to LAD, SVG to OM and SVG to right coronary. She underwent an echocardiogram which demonstrates normal EF of 55% to 65%. However, there is stage 1 diastolic dysfunction and evidence for elevated mean left atrial filling pressure. There were mildly thickened calcified aortic valve leaflets but no stenosis and mild tricuspid regurgitation. At the time, I started her on Lasix 20 mg daily. A BMP was obtained that day which indicated mild elevation at 75.5, creatinine was 1.16. She has been taking the Lasix with improvement in her lower extremity swelling although shortness of breath seems to be about the same. Again, she notes it is only with marked exertion, not necessarily with normal activities. However, she does have ongoing fatigue and easy fatigability. This may be due to just poor exercise tolerance. She also reported that recently you started her on a new SLGT2 diabetic medicine and she said that she took several doses of that, which I believe was Invokana, and she became markedly dizzy with that. She has since stopped that medication. Again, overall the patient is fairly stable with a normal ejection fraction. She recently had bypass and is not having any anginal symptoms and therefore I do not suspect that this is an issue related to her bypass grafts.   I saw [name] today in the office for an acute visit regarding what is described as abdominal pain. She reports a numbness and pain sensation over the right upper quadrant. She's also noted some firmness and distention of her abdomen. This is recently presented to a GI doctor in [place] who thought she might have a gastric ulcer. He performed endoscopy which was a reportedly unrevealing. He also recommended she is take  MiraLAX for constipation. She says that recently she's been constipated but also has been having some small amount of loose stool. She did not want to take the MiraLAX given the loose stool. She also reports some of the discomfort extends across the mid epigastrium and into the lower chest. It is entirely atypical for cardiac chest pain. She reports that she had a colonoscopy in the past by Dr. Henrene Pastor about 5 years ago and a repeat colonoscopy was recommended around 2018. She also tells me that she recently had a right upper quadrant ultrasound performed at [facility] after presenting with abdominal pain which was negative for acute biliary disease.  [name] returns today for follow-up. Overall she seems to be doing well from a cardiac standpoint. I understand she is switched primary care providers to [facility] primary care, however she is unclear of her primary care provider. She does have an upcoming appointment to see Jackelyn Knife neurology. Recently she's been having trouble with forgetfulness and it seems to be getting worse. She tells me she lost her cell phone at home and has not been able to locate it. She denies any chest pain or shortness of breath and in fact her flank back and abdominal pain which was evaluated extensively has now gone away.  I had the pleasure of seeing [name] back today in follow-up. It's been 6 months and she'll last appointment. Her main concern today is leg weakness. She reports she has some back pain and associated weakness in her legs that give out from time  to time. She recently had memory testing indicating possible dementia as well as likely depression. It was recommended that she go on therapy however she declined medications. She denies any chest pain or worsening shortness of breath.  07/01/2016  [name] returns today for follow-up. She was recently hospitalized for acute diastolic heart failure. She was treated with diuretics and  adjustments are made to her blood pressure medications. She had a repeat echocardiogram which was essentially stable showed normal LV EF and diastolic dysfunction. After discharge she underwent nuclear stress testing which was negative for ischemia. She saw Ignacia Bayley, NP, who made some changes to her blood pressure medications including placing her on clonidine. Since taking this medicine she has had some of the typical symptoms of fatigue and dry mouth as well as constipation. She was alert initially taking clonidine 2 mg twice daily. She then decreased it to one half tablet or 1 mg twice daily but blood pressure remains high. I checked her blood pressure manually today 160/60. She reports fatigue and low energy.  09/22/2016  [name] returns today for hospital follow-up. 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 She had a St. Jude surety MRI safe pacemaker placed. She was also started on amiodarone and underwent cardioversion. She seems to be maintaining sinus rhythm. She reports improvement in her energy. She recently saw Ignacia Bayley, NP, in follow-up who reported she was doing well although noted her blood pressure was more elevated. She was placed on doxazosin 4 mg daily and had initial improvement in blood pressure however blood pressure has since gone up. Today was 186/62. Unfortunately, she's been intolerant of a number of blood pressure medications in the past.  12/23/2016  [name] was seen in follow-up today. She saw Dr. candidates in January who noted that she reported being shaky on amiodarone and he decreased the dose down to 100 mg daily. She remains in an AV paced rhythm but there has been recurrent underlying atrial flutter. She is also had some fatigue. The pressure is elevated today 156/70. I reviewed home monitoring indicates blood pressures continue to be elevated. She seems to be okay with these numbers but she  is at increased risk for events. Unfortunate she's had intolerance to number of medications.  04/22/2017  [name] returns today for follow-up. She recently saw Almyra Deforest, PA-C, who noted that she had had some weight gain and lower extremity edema. He discontinued her chlorthalidone and put her on low-dose Lasix. She's had improvement in her swallowing with this and some weight loss. She then followed up with Dr. Moshe Walker at [facility]. Her creatinine was noted to be 2.7 and she is undergoing a renal ultrasound and further workup for this. She was also noted to be anemic with a low iron saturation. She's been receiving some IV iron. She seems to be maintaining sinus rhythm on low-dose amiodarone. Recently she saw her endocrinologist who had increased her Synthroid. Her last TSH was between 7 and 8. She is due for repeat check of that. She has done well with the placement of a pacemaker in the fall. She has remote checks to follow that.  08/25/2017  [name] was seen today in follow-up.  Recently she had about 12 pound weight gain and saw her nephrologist.  She was started back on Lasix and was noted to have an elevated creatinine now back up to 2.14.  In addition her blood pressure was running low, specifically  her diastolic blood pressure and her amlodipine was decreased to 5 mg daily.  She seems to be maintaining sinus rhythm and is on low-dose amiodarone.  She is in a paced rhythm today and recent remote check showed no real evidence of atrial fibrillation.  She was on Xarelto however stopped that on her own recently because of fatigue and weakness.  She was also noted to be anemic and has been started on iron.  This begs a question of whether she may be having microscopic bleeding or whether her anemia was related to chronic kidney disease.  Nevertheless, she has an appropriately elevated chads vascular score and should be anticoagulated on Xarelto.  10/26/2017  [name] returns today  for follow-up.  She again is gained about 5 or 6 pounds.  Apparently she had seen her nephrologist and was started back on Lasix however her creatinine is rising.  I repeated labs on October 10, 2017 which showed a creatinine had increased up to 2.24, previously 1.9 and 2.45.  Her pro-BNP was elevated at 1437.  Since that time she has had some worsening shortness of breath, lower extremity swelling and increasing abdominal girth.  Blood pressure is quite elevated today 190/70, however she says is been lower at home.  06/12/2018  [name] is again seen today in follow-up.  She has had some increased abdominal girth but no significant swelling or edema.  Remote pacemaker checks have been performed which showed normal battery life and adequate pacer function.  She is noted today to be in an atrial paced rhythm with prolonged AV conduction at 60.  She denies any chest pain or worsening shortness of breath. She was recently seen by Dr. Moshe Walker at [facility].  It is felt that her creatinine has been somewhat stable.  11/27/2018  [name] seen today for follow-up of left heart catheterization.  This occurred in December after having symptoms of progressive shortness of breath and chest tightness.  She did have a stress test in November 2019 which showed a new area of ischemia in the OM territory.  She underwent left heart catheterization which showed high-grade graft disease with 95% ostial stenosis in the graft to the marginal vessel which likely accounted for the nuclear study changes.  However, given her chronic kidney disease stage PCI was recommended.  She went back for PCI in December 2019 which was successful.  Since then she is done fairly well.  Immediately had some improvement in shortness of breath and discomfort however has had some recurrent shortness of breath.  She has had some improvement in dyslipidemia now after restarting rosuvastatin 5 mg daily however LDL remains elevated at 114.   She previously could not tolerate higher doses of statins however I am not sure as she has been on high potency rosuvastatin.  She also remains on Plavix and Xarelto which I think may be a lifelong combination for her.  Also, her blood pressure has been running little higher after stopping her amlodipine recently because of lower blood pressure.  08/15/2019  [name] is seen today in follow-up with her daughter.  Overall she continues to do well without recurrent chest pain after PCI last year.  She is on Plavix and Xarelto and denies any significant bleeding issues on that.  She also takes daily torsemide.  EKG shows an atrial paced rhythm today.  He recently has had some hearing difficulties but that is being addressed.  Blood pressure is at times labile however has been  reasonably well controlled.  Although the systolics a little higher she does have a wide pulse pressure and I would be hesitant to be real aggressive about her treatment as to not lower her diastolics any further.  Today her diastolic pressure was 48.  PMHx:  Past Medical History:  Diagnosis Date   Anemia    Anxiety    Arthritis    "in my hands; knees, back" (09/27/2018)   CAD (coronary artery disease)    a. 40-59% bilaterally 10/2015.   Chronic diastolic CHF (congestive heart failure) ([place])    a. 05/2016 Echo: EF 60-65%, no rwma, Gr1 DD, Ao sclerosis w/o stenosis, triv MR;  b. 07/2016 TEE: EF 55-60%, no rwma, mild MR.   CKD (chronic kidney disease), stage III    Coronary artery disease    a. 02/2007 Persantine MV: low risk;  b. 11/2011 CABG x 3 (LIMA->LAD, VG->OM, VG->RCA);  c. 05/2016 MV: EF >65%, no isch/infarct, horiz ST dep in I, II, V5-V6.   Depression    Diverticulosis    Esophageal stricture    GERD (gastroesophageal reflux disease)    Hemorrhoids    Hiatal hernia    Hyperkalemia    a. ARB stopped due to this.   Hyperlipidemia    Hypertension    Hypertensive heart disease    Hypothyroidism     Mild cognitive impairment    a. seen by neurology.   Myocardial infarction ([place]) 12/07/2011   PAF (paroxysmal atrial fibrillation) ([place])    a. post-op CABG.   Pain    RIGHT KNEE PAIN - TORN RIGHT MEDIAL MENISCUS   Paroxysmal atrial flutter ([place])    a. 07/2016 s/p TEE & DCCV;  b. 07/2016 Recurrent PAFlutter req initiation of amio & PPM in setting of tachy-brady;  c. CHA2DS2VASc = 7-->Xarelto 15 mg QD.   Pneumonia    "twice" (09/27/2018)   PONV (postoperative nausea and vomiting)    Presence of permanent cardiac pacemaker 08/12/2016   S/P CABG (coronary artery bypass graft), 12/04/11 12/07/2011   LIMA to LAD, SVG to OM, SVG to RCA   Sinus bradycardia    a. not on BB due to this.   Skin cancer    "face" (09/27/2018)   Tachy-brady syndrome ([place])    a. 07/2016 Jxnl brady following DCCV, recurrent Aflutter-->amio + SJM 2272 Assurity MRI DC PPM (ser # [id number]).   Type II diabetes mellitus ([place])     Past Surgical History:  Procedure Laterality Date   ABDOMINAL HYSTERECTOMY  1980's   ANKLE FRACTURE SURGERY Right    "put pins both side right ankle"   BACK SURGERY  2006   "cyst growing near my spine"   CARDIAC CATHETERIZATION  12/02/2011   mild LV dysfunction with mod hypocontractility of mid-distal anterolateral wall; CAD w/ostial tapering of L [place] with 50% diffuse ostial narrowing of LAD, 99% eccentric focal prox LAD stenosis followed by 70% prox LAD stenosis after 1st diag, 20% mid LAD narrowing; 80% ostial-to-prox L Cfx stenosis & 40-50% irregularity of RCA (Dr. Corky Downs)   CARDIOVERSION N/A 08/11/2016   Procedure: CARDIOVERSION;  Surgeon: Lelon Perla, MD;  Location: [facility]COPY;  Service: Cardiovascular;  Laterality: N/A;   CATARACT EXTRACTION W/ INTRAOCULAR LENS  IMPLANT, BILATERAL Bilateral ~ 2010   [place] GRAFT  12/04/2011   Procedure: CORONARY ARTERY BYPASS GRAFTING (CABG);  Surgeon: Tharon Aquas Adelene Idler, MD;  Location: [facility];  Service: Open Heart Surgery;  Laterality: N/A;  CABG x three,  using left internal mammary artery, and right leg greater saphenous vein harvested endoscopically   CORONARY STENT INTERVENTION N/A 09/27/2018   Procedure: CORONARY STENT INTERVENTION;  Surgeon: Troy Sine, MD;  Location: [facility] CV LAB;  Service: Cardiovascular;  Laterality: N/A;   DILATION AND CURETTAGE OF UTERUS     "a couple times"   EP IMPLANTABLE DEVICE N/A 08/12/2016   Procedure: Pacemaker Implant;  Surgeon: Will Meredith Leeds, MD;  Location: [facility] CV LAB;  Service: Cardiovascular;  Laterality: N/A;   ESOPHAGOGASTRODUODENOSCOPY (EGD) WITH ESOPHAGEAL DILATION     FRACTURE SURGERY     JOINT REPLACEMENT     KNEE ARTHROSCOPY WITH MEDIAL MENISECTOMY Right 07/02/2014   Procedure: RIGHT KNEE ARTHROSCOPY WITH PARTIAL MEDIAL MENISTECTOMY, ABRASION CONDROPLASTYU OF PATELLA,ABRASION CONDROPLASTY OF MEDIAL FEMEROL CONDYL, MICROFRACTURE OF MEDIAL FEMEROL CONDYL;  Surgeon: Tobi Bastos, MD;  Location: [facility];  Service: Orthopedics;  Laterality: Right;   LEFT HEART CATH AND CORS/GRAFTS ANGIOGRAPHY N/A 09/06/2018   Procedure: LEFT HEART CATH AND CORS/GRAFTS ANGIOGRAPHY;  Surgeon: Troy Sine, MD;  Location: [facility] LAB;  Service: Cardiovascular;  Laterality: N/A;   LEFT HEART CATHETERIZATION WITH CORONARY ANGIOGRAM N/A 12/02/2011   Procedure: LEFT HEART CATHETERIZATION WITH CORONARY ANGIOGRAM;  Surgeon: Troy Sine, MD;  Location[facility] CATH LAB;  Service: Cardiovascular;  Laterality: N/A;  Coronary angiogram, possible PCI   TEE WITHOUT CARDIOVERSION N/A 08/11/2016   Procedure: TRANSESOPHAGEAL ECHOCARDIOGRAM (TEE);  Surgeon: Lelon Perla, MD;  Location: [facility] ENDOSCOPY;  Service: Cardiovascular;  Laterality: N/A;   TEE WITHOUT CARDIOVERSION N/A 05/18/2019   Procedure: TRANSESOPHAGEAL ECHOCARDIOGRAM (TEE);  Surgeon: [name], MD;  Location: [facility] ENDOSCOPY;  Service: Cardiovascular;  Laterality:  N/A;   [place] Left ~ 2006   TRANSTHORACIC ECHOCARDIOGRAM  02/19/2013   EF 81-82%, grade 1 diastolic dysfunction; mildly thickend/calcified AV leaflets; mildly calcidied MV annulus; mild TR    FAMHx:  Family History  Problem Relation Age of Onset   Diabetes Mother    CVA Mother    Hypertension Mother    Heart disease Father    Hyperlipidemia Father    Breast cancer Sister        x 3   Heart disease Brother        x5; one with MI   Heart disease Sister        x3   Diabetes Sister        x3   Lung cancer Sister    Breast cancer Sister        x2   Colon cancer Neg Hx     SOCHx:   reports that she has never smoked. She has never used smokeless tobacco. She reports previous alcohol use. She reports that she does not use drugs.  ALLERGIES:  Allergies  Allergen Reactions   Clonidine Derivatives Other (See Comments)    Bradycardia and fatigue    Sulfa Antibiotics Other (See Comments)    Unknown   Crestor [Rosuvastatin Calcium] Other (See Comments)    Other reaction(s): tired and weak   Epinephrine Other (See Comments)    Abnormal feeling. Dental exam/injection of local w/ epi.   Hydralazine Other (See Comments)    Nausea/gi upset    Losartan Other (See Comments)    Hyperkalemia    Other Other (See Comments)    MANGO'S - WELTS ALL OVER    ROS: Pertinent items noted in  HPI and remainder of comprehensive ROS otherwise negative.  HOME MEDS: Current Outpatient Medications  Medication Sig Dispense Refill   acetaminophen (TYLENOL) 650 MG CR tablet Take 650 mg by mouth every 8 (eight) hours as needed for pain.     Ascorbic Acid (VITAMIN C PO) Take 1 tablet by mouth 3 (three) times a week.     aspirin EC 81 MG tablet Take 1 tablet (81 mg total) by mouth daily. 90 tablet 0   carvedilol (COREG) 25 MG tablet Take 1 tablet (25 mg total) by mouth 2 (two) times daily. 60 tablet 3   Cyanocobalamin (VITAMIN B-12  PO) Take 1 tablet by mouth 3 (three) times a week.      docusate sodium (COLACE) 100 MG capsule Take 100 mg by mouth 2 (two) times daily as needed for mild constipation or moderate constipation.     ferrous sulfate 325 (65 FE) MG EC tablet Take 325 mg by mouth daily with breakfast.     gabapentin (NEURONTIN) 300 MG capsule Take 1 capsule (300 mg total) by mouth at bedtime. 30 capsule 2   LANTUS SOLOSTAR 100 UNIT/ML Solostar Pen Inject 22 Units into the skin daily.  6   levothyroxine (SYNTHROID) 88 MCG tablet Take 88 mcg by mouth daily before breakfast.      meclizine (ANTIVERT) 25 MG tablet Take 25 mg by mouth daily as needed for dizziness. For dizziness     Multiple Vitamins-Minerals (PRESERVISION AREDS 2 PO) Take 1 tablet by mouth every evening.      Polyethyl Glycol-Propyl Glycol (SYSTANE OP) Place 1 drop into both eyes 2 (two) times daily.      Rosuvastatin Calcium 10 MG CPSP Take 10 mg by mouth daily.      torsemide (DEMADEX) 20 MG tablet Take 1 tablet (20 mg total) by mouth daily. 30 tablet 2   esomeprazole (NEXIUM) 20 MG capsule TAKE 1 CAPSULE BY MOUTH ONCE DAILY AT 12 NOON     No current facility-administered medications for this visit.     LABS/IMAGING: No results found for this or any previous visit (from the past 48 hour(s)). No results found.  VITALS: BP (!) 144/48    Pulse 60    Ht 5' (1.524 m)    Wt 132 lb (59.9 kg)    SpO2 100%    BMI 25.78 kg/m   EXAM: General appearance: alert and no distress Neck: no carotid bruit and no JVD Lungs: clear to auscultation bilaterally Heart: regular rate and rhythm, S1, S2 normal, no murmur, click, rub or gallop Abdomen: soft, non-tender; bowel sounds normal; no masses,  no organomegaly Extremities: edema Trace to 1+ bilateral lower extremity edema Pulses: 2+ and symmetric Skin: Skin color, texture, turgor normal. No rashes or lesions Neurologic: Grossly normal  EKG: Atrial paced rhythm at 60, lateral ST and T wave  changes-personally reviewed  ASSESSMENT: 1. Unstable angina with recent PCI to the SVG to marginal graft (2019) 2. Coronary artery disease status post CABG x 3 (LIMA to LAD, SVG to Diag, SVG to RCA) in 2013 3. Paroxysmal atrial flutter-CHADSVASC score of 5 on Xarelto 4. Tachybradycardia syndrome status post St. Jude permanent pacemaker (07/2016) 5. Diabetes type 2 - now on insulin 6. Hypertension 7. Dyslipidemia 8. Peripheral neuropathy - secondary to diabetes 9. Progressive memory loss 10. Low back pain and leg weakness  11. CKD 3  PLAN: 1.   Kelee seems to be doing well without recurrent anginal symptoms.  Although she has had some  slightly higher than normal blood pressures with diastolics are low at home hesitant to readjust her medications based on that to avoid any syncopal episode.  She has had some progressive memory loss and recently some hearing loss issues.  She denies any symptomatic recurrent atrial flutter.  She is anticoagulated on Xarelto.  No changes to her medications today.  Plan follow-up in 6 months or sooner as necessary.  [name], MD, [facility], [place] Director of the Advanced Lipid Disorders &  Cardiovascular Risk Reduction Clinic Attending Cardiologist  Direct Dial: [phone]   Fax: [phone]  Website:  [email or web address] Chandler Stofer 08/15/2019, 2:31 PM

## 2019-08-15 NOTE — Patient Instructions (Signed)
Medication Instructions:  Dr Debara Pickett recommends that you continue on your current medications as directed. Please refer to the Current Medication list given to you today.  *If you need a refill on your cardiac medications before your next appointment, please call your pharmacy*  Follow-Up: At [facility], you and your health needs are our priority.  As part of our continuing mission to provide you with exceptional heart care, we have created designated Provider Care Teams.  These Care Teams include your primary Cardiologist (physician) and Advanced Practice Providers (APPs -  Physician Assistants and Nurse Practitioners) who all work together to provide you with the care you need, when you need it.  Your next appointment:   6 months - You will receive a reminder letter in the mail two months in advance. If you don't receive a letter, please call our office to schedule the follow-up appointment.  The format for your next appointment:   In Person  Provider:   You may see [name], MD or one of the following Advanced Practice Providers on your designated Care Team:    Almyra Deforest, PA-C  Fabian Sharp, PA-C or   Roby Lofts, [place]

## 2019-08-16 ENCOUNTER — Encounter: Payer: Self-pay | Admitting: Internal Medicine

## 2019-08-21 ENCOUNTER — Ambulatory Visit (INDEPENDENT_AMBULATORY_CARE_PROVIDER_SITE_OTHER): Payer: Medicare Other | Admitting: *Deleted

## 2019-08-21 DIAGNOSIS — I480 Paroxysmal atrial fibrillation: Secondary | ICD-10-CM

## 2019-08-21 DIAGNOSIS — I495 Sick sinus syndrome: Secondary | ICD-10-CM | POA: Diagnosis not present

## 2019-08-21 LAB — CUP PACEART REMOTE DEVICE CHECK
Battery Remaining Longevity: 119 mo
Battery Remaining Percentage: 95.5 %
Battery Voltage: 2.99 V
Brady Statistic AP VP Percent: 1 %
Brady Statistic AP VS Percent: 98 %
Brady Statistic AS VP Percent: 1 %
Brady Statistic AS VS Percent: 1 %
Brady Statistic RA Percent Paced: 97 %
Brady Statistic RV Percent Paced: 1 %
Date Time Interrogation Session: 20201027060013
Implantable Lead Implant Date: 20171019
Implantable Lead Implant Date: 20171019
Implantable Lead Location: 753859
Implantable Lead Location: 753860
Implantable Pulse Generator Implant Date: 20171019
Lead Channel Impedance Value: 380 Ohm
Lead Channel Impedance Value: 740 Ohm
Lead Channel Pacing Threshold Amplitude: 0.5 V
Lead Channel Pacing Threshold Amplitude: 0.75 V
Lead Channel Pacing Threshold Pulse Width: 0.4 ms
Lead Channel Pacing Threshold Pulse Width: 0.4 ms
Lead Channel Sensing Intrinsic Amplitude: 12 mV
Lead Channel Sensing Intrinsic Amplitude: 5 mV
Lead Channel Setting Pacing Amplitude: 2 V
Lead Channel Setting Pacing Amplitude: 2.5 V
Lead Channel Setting Pacing Pulse Width: 0.4 ms
Lead Channel Setting Sensing Sensitivity: 2 mV
Pulse Gen Model: 2272
Pulse Gen Serial Number: 3180458

## 2019-09-04 ENCOUNTER — Ambulatory Visit: Payer: Medicare Other

## 2019-09-04 ENCOUNTER — Ambulatory Visit (INDEPENDENT_AMBULATORY_CARE_PROVIDER_SITE_OTHER): Payer: Medicare Other | Admitting: Psychology

## 2019-09-04 ENCOUNTER — Other Ambulatory Visit: Payer: Self-pay

## 2019-09-04 DIAGNOSIS — F0150 Vascular dementia without behavioral disturbance: Secondary | ICD-10-CM

## 2019-09-04 DIAGNOSIS — F339 Major depressive disorder, recurrent, unspecified: Secondary | ICD-10-CM

## 2019-09-04 DIAGNOSIS — F01A0 Vascular dementia, mild, without behavioral disturbance, psychotic disturbance, mood disturbance, and anxiety: Secondary | ICD-10-CM

## 2019-09-04 DIAGNOSIS — R413 Other amnesia: Secondary | ICD-10-CM

## 2019-09-04 NOTE — Progress Notes (Signed)
NEUROPSYCHOLOGICAL EVALUATION [place] of Neurology  Reason for Referral:   [name] is a 78 y.o. Caucasian female referred by Alonza Bogus, D.O., to characterize her current cognitive functioning and assist with diagnostic clarity and treatment planning in the context of history of a mild vascular neurocognitive disorder and subjective cognitive decline.  Assessment and Plan:   Clinical Impression(s): Ms. Markell's pattern of performance is suggestive of primary impairments surrounding executive functioning and phonemic fluency, with additional weaknesses across processing speed, complex attention, and complex visuospatial/constructional abilities. Relative to her prior neuropsychological evaluation in March 2017, areas of weakness were largely stable. However, mild decline was observed across executive functioning and complex visuospatial tasks. Overall, given evidence for cognitive dysfunction, coupled with Ms. Dalal report of variable ability to complete activities of daily living (ADLs), she continues to meet criteria for a Mild Vascular Neurocognitive Disorder (formerly "mild cognitive impairment"); however, she is likely towards the moderate to severe end of this spectrum currently.  It is important to note that Ms. Jaggi scored in the severe range on a depression questionnaire, elevated relative to her 2017 evaluation. While this is likely caused by the ongoing COVID-19 pandemic and symptoms of isolation and social withdrawal, severe depressive symptoms can negatively influence cognitive abilities, especially across domains of processing speed, attention/concentration, and executive functioning. It is possible that some of her exhibited declines are due to these symptoms; however, the extent to which this is true cannot be quantified.  Specific to memory, despite some difficulties learning novel information at times, Ms. Manera was able  to retain this knowledge after lengthy delays. Overall, memory performance combined with intact performances across other areas of cognitive functioning is not suggestive of an underlying neurodegenerative condition affecting memory processes (e.g., Alzheimer's disease). Likewise, her cognitive and behavioral profile is not suggestive of any other form of neurodegenerative illness presently.  Recommendations: A repeat neuropsychological evaluation in the future would be beneficial in tracking cognitive changes over time. Vascular etiologies are generally not expected to exhibit a decline over time, and Ms. Daquila expressed her lack of desire to engage in the current evaluation (in a pleasant manner). As such, a future repeat evaluation could be considered should Ms. Gradillas and her family note worsening cognitive or functional abilities.   A combination of medication and psychotherapy has been shown to be most effective at treating symptoms of anxiety and depression. As such, Ms. Schaller is encouraged to speak with her prescribing physician regarding medication adjustments to optimally manage these symptoms. Likewise, Ms. Los is encouraged to consider engaging in short-term psychotherapy to address symptoms of psychiatric distress. During the pandemic, counseling services can be provided virtually. She would benefit from an active and collaborative therapeutic environment, rather than one purely supportive in nature. Recommended treatment modalities include Cognitive Behavioral Therapy (CBT) or Acceptance and Commitment Therapy (ACT).  Optimal control of vascular risk factors (including safe cardiovascular exercise and adherence to dietary recommendations) is encouraged. Continued participation in activities which provide mental stimulation and social interaction is also recommended.   Because she shows better recall for structured information, she will likely understand and retain new information better  if it is presented to her in a meaningful or well-organized manner at the outset, such as grouping items into meaningful categories or presenting information in an outlined, bulleted, or story format.  To address problems with processing speed, she may wish to consider:   -Adjusting the speed at which new information is  presented   -Allowing additional processing time or a chance to rehearse novel information   -Allowing for more time in comprehending, processing, and responding in conversation   -Repeating and paraphrasing instructions or conversations aloud  To address problems with complex attention and executive functioning, she may wish to consider:   -Avoiding external distractions when needing to concentrate   -Limiting exposure to fast paced environments with multiple sensory demands   -Writing down complicated information and using checklists   -Attempting and completing one task at a time (i.e., no multi-tasking)   -Verbalizing aloud each step of a task to maintain focus   -Reducing the amount of information considered at one time  Review of Records:   Ms. Mccallister completed a comprehensive neuropsychological evaluation Antionette Poles, Ph.D.) on 01/23/2016. Results suggested "serious deficiencies" on measures of visual processing speed, auditory working memory, and aspects of executive functioning. Memory was described as a relative strength. Overall, she was diagnosed with a mild neurocognitive disorder, likely secondary to vascular disease, as well as dysthymic disorder. A repeat neuropsychological to track changes over time was recommended.  Ms. Barnier was also recently seen by [facility] Wells Guiles Tat, D.O.) on 08/01/2019 for follow-up of memory changes. Ms. Bumpus reported ongoing difficulties with short-term memory and generalized forgetfulness. Her daughter noted some difficulties performing ADLs, stating that while her mother takes care of monthly finances, her daughter has  found bills lying around that are late or misplaced. Her daughter also reported filling her mother's medication box and that there are times where Ms. Soliman forgets to take her medications. Performance on a brief cognitive screening instrument [facility]) was in the abnormal range (19/30). Points were lost across the following domains: visuospatial/executive (2/5), serial 7s (1/3), sentence repetition (1/2), verbal fluency (0/1), verbal abstraction (0/2), and delayed recall (3/5). Ultimately, Ms. Wangerin was referred for an updated neuropsychological evaluation to characterize her cognitive abilities and assess the extent of cognitive decline.   Head CT on 05/15/2019 revealed age-related atrophy and chronic microvascular ischemic changes, but no intracranial abnormality.   Past Medical History:  Diagnosis Date   Anemia    Anxiety    Arthritis    "in my hands; knees, back" (09/27/2018)   CAD (coronary artery disease)    a. 40-59% bilaterally 10/2015.   Chronic diastolic CHF (congestive heart failure) ([place])    a. 05/2016 Echo: EF 60-65%, no rwma, Gr1 DD, Ao sclerosis w/o stenosis, triv MR;  b. 07/2016 TEE: EF 55-60%, no rwma, mild MR.   CKD (chronic kidney disease), stage III    Coronary artery disease    a. 02/2007 Persantine MV: low risk;  b. 11/2011 CABG x 3 (LIMA->LAD, VG->OM, VG->RCA);  c. 05/2016 MV: EF >65%, no isch/infarct, horiz ST dep in I, II, V5-V6.   Depression    Diverticulosis    Esophageal stricture    GERD (gastroesophageal reflux disease)    Hemorrhoids    Hiatal hernia    Hyperkalemia    a. ARB stopped due to this.   Hyperlipidemia    Hypertension    Hypertensive heart disease    Hypothyroidism    Mild cognitive impairment    a. seen by neurology.   Myocardial infarction ([place]) 12/07/2011   PAF (paroxysmal atrial fibrillation) ([place])    a. post-op CABG.   Pain    RIGHT KNEE PAIN - TORN RIGHT MEDIAL MENISCUS   Paroxysmal atrial flutter ([place])    a. 07/2016  s/p TEE & DCCV;  b. 07/2016 Recurrent  PAFlutter req initiation of amio & PPM in setting of tachy-brady;  c. CHA2DS2VASc = 7-->Xarelto 15 mg QD.   Pneumonia    "twice" (09/27/2018)   PONV (postoperative nausea and vomiting)    Presence of permanent cardiac pacemaker 08/12/2016   S/P CABG (coronary artery bypass graft), 12/04/11 12/07/2011   LIMA to LAD, SVG to OM, SVG to RCA   Sinus bradycardia    a. not on BB due to this.   Skin cancer    "face" (09/27/2018)   Tachy-brady syndrome ([place])    a. 07/2016 Jxnl brady following DCCV, recurrent Aflutter-->amio + SJM 2272 [address] MRI DC PPM (ser # [id number]).   Type II diabetes mellitus ([place])     Past Surgical History:  Procedure Laterality Date   ABDOMINAL HYSTERECTOMY  1980's   ANKLE FRACTURE SURGERY Right    "put pins both side right ankle"   BACK SURGERY  2006   "cyst growing near my spine"   CARDIAC CATHETERIZATION  12/02/2011   mild LV dysfunction with mod hypocontractility of mid-distal anterolateral wall; CAD w/ostial tapering of L Main with 50% diffuse ostial narrowing of LAD, 99% eccentric focal prox LAD stenosis followed by 70% prox LAD stenosis after 1st diag, 20% mid LAD narrowing; 80% ostial-to-prox L Cfx stenosis & 40-50% irregularity of RCA (Dr. Corky Downs)   CARDIOVERSION N/A 08/11/2016   Procedure: CARDIOVERSION;  Surgeon: Lelon Perla, MD;  Location: [facility];  Service: Cardiovascular;  Laterality: N/A;   CATARACT EXTRACTION W/ INTRAOCULAR LENS  IMPLANT, BILATERAL Bilateral ~ 2010   [place] GRAFT  12/04/2011   Procedure: CORONARY ARTERY BYPASS GRAFTING (CABG);  Surgeon: Tharon Aquas Adelene Idler, MD;  Location: [facility];  Service: Open Heart Surgery;  Laterality: N/A;  CABG x three,  using left internal mammary artery, and right leg greater saphenous vein harvested endoscopically   CORONARY STENT INTERVENTION N/A 09/27/2018   Procedure: CORONARY STENT INTERVENTION;  Surgeon: Troy Sine, MD;  Location: [facility] CV [facility];  Service: Cardiovascular;  Laterality: N/A;   DILATION AND CURETTAGE OF UTERUS     "a couple times"   EP IMPLANTABLE DEVICE N/A 08/12/2016   Procedure: Pacemaker Implant;  Surgeon: Will Meredith Leeds, MD;  Location: [facility];  Service: Cardiovascular;  Laterality: N/A;   ESOPHAGOGASTRODUODENOSCOPY (EGD) WITH ESOPHAGEAL DILATION     FRACTURE SURGERY     JOINT REPLACEMENT     KNEE ARTHROSCOPY WITH MEDIAL MENISECTOMY Right 07/02/2014   Procedure: RIGHT KNEE ARTHROSCOPY WITH PARTIAL MEDIAL MENISTECTOMY, ABRASION CONDROPLASTYU OF PATELLA,ABRASION CONDROPLASTY OF MEDIAL FEMEROL CONDYL, MICROFRACTURE OF MEDIAL FEMEROL CONDYL;  Surgeon: Tobi Bastos, MD;  Location: [facility];  Service: Orthopedics;  Laterality: Right;   LEFT HEART CATH AND CORS/GRAFTS ANGIOGRAPHY N/A 09/06/2018   Procedure: LEFT HEART CATH AND CORS/GRAFTS ANGIOGRAPHY;  Surgeon: Troy Sine, MD;  Location: [facility];  Service: Cardiovascular;  Laterality: N/A;   LEFT HEART CATHETERIZATION WITH CORONARY ANGIOGRAM N/A 12/02/2011   Procedure: LEFT HEART CATHETERIZATION WITH CORONARY ANGIOGRAM;  Surgeon: Troy Sine, MD;  Location: [facility] C[facility] LAB;  Service: Cardiovascular;  Laterality: N/A;  Coronary angiogram, possible PCI   TEE WITHOUT CARDIOVERSION N/A 08/11/2016   Procedure: TRANSESOPHAGEAL ECHOCARDIOGRAM (TEE);  Surgeon: Lelon Perla, MD;  Location: [facility];  Service: Cardiovascular;  Laterality: N/A;   TEE WITHOUT CARDIOVERSION N/A 05/18/2019   Procedure: TRANSESOPHAGEAL ECHOCARDIOGRAM (TEE);  Surgeon: [name], MD;  Location: [facility]  ENDOSCOPY;  Service: Cardiovascular;  Laterality: N/A;   TONSILLECTOMY  1949   TOTAL KNEE ARTHROPLASTY Left ~ 2006   TRANSTHORACIC ECHOCARDIOGRAM  02/19/2013   EF 63-33%, grade 1 diastolic dysfunction; mildly thickend/calcified AV leaflets; mildly calcidied MV annulus; mild TR    Family History  Problem Relation  Age of Onset   Diabetes Mother    CVA Mother    Hypertension Mother    Heart disease Father    Hyperlipidemia Father    Breast cancer Sister        x 3   Heart disease Brother        x5; one with MI   Heart disease Sister        x3   Diabetes Sister        x3   Lung cancer Sister    Breast cancer Sister        x2   Colon cancer Neg Hx      Current Outpatient Medications:    acetaminophen (TYLENOL) 650 MG CR tablet, Take 650 mg by mouth every 8 (eight) hours as needed for pain., Disp: , Rfl:    Ascorbic Acid (VITAMIN C PO), Take 1 tablet by mouth 3 (three) times a week., Disp: , Rfl:    carvedilol (COREG) 25 MG tablet, Take 1 tablet (25 mg total) by mouth 2 (two) times daily., Disp: 60 tablet, Rfl: 3   Cyanocobalamin (VITAMIN B-12 PO), Take 1 tablet by mouth 3 (three) times a week. , Disp: , Rfl:    docusate sodium (COLACE) 100 MG capsule, Take 100 mg by mouth 2 (two) times daily as needed for mild constipation or moderate constipation., Disp: , Rfl:    esomeprazole (NEXIUM) 20 MG capsule, TAKE 1 CAPSULE BY MOUTH ONCE DAILY AT 12 NOON, Disp: , Rfl:    ferrous sulfate 325 (65 FE) MG EC tablet, Take 325 mg by mouth daily with breakfast., Disp: , Rfl:    gabapentin (NEURONTIN) 300 MG capsule, Take 1 capsule (300 mg total) by mouth at bedtime., Disp: 30 capsule, Rfl: 2   LANTUS SOLOSTAR 100 UNIT/ML Solostar Pen, Inject 22 Units into the skin daily., Disp: , Rfl: 6   levothyroxine (SYNTHROID) 88 MCG tablet, Take 88 mcg by mouth daily before breakfast. , Disp: , Rfl:    meclizine (ANTIVERT) 25 MG tablet, Take 25 mg by mouth daily as needed for dizziness. For dizziness, Disp: , Rfl:    Multiple Vitamins-Minerals (PRESERVISION AREDS 2 PO), Take 1 tablet by mouth every evening. , Disp: , Rfl:    Polyethyl Glycol-Propyl Glycol (SYSTANE OP), Place 1 drop into both eyes 2 (two) times daily. , Disp: , Rfl:    Rosuvastatin Calcium 10 MG CPSP, Take 10 mg by mouth daily. ,  Disp: , Rfl:    torsemide (DEMADEX) 20 MG tablet, Take 1 tablet (20 mg total) by mouth daily., Disp: 30 tablet, Rfl: 2  Clinical Interview:   Cognitive Symptoms: Decreased short-term memory: Endorsed. Ms. Lazarus endorsed symptoms of generalized forgetfulness, stating that sometimes she can't remember information, but it will generally come to her later on. Her daughter noted Ms. Alemany exhibiting trouble remembering the details of conversations, asking repetitive questions, and misplacing objects around the home. These have said to be ongoing for several years, but have gradually worsened since her prior evaluation in 2017. Decreased long-term memory: Denied. Decreased attention/concentration: Endorsed. She acknowledged some difficulties maintaining her focus, being easily distracted, and losing her train of thought. However, these were  said to occur occasionally.  Reduced processing speed: Endorsed. This was partially attributed to Ms. Stegmann generally operating tasks within a "slow groove."  Difficulties with executive functions: Endorsed. Difficulties were said to surround organization and occasional indecisiveness. Trouble with impulsivity or using good judgment was denied.  Difficulties with emotion regulation: Denied. Difficulties with receptive language: Denied under the assumption that she can hear the source of the sound appropriately. Difficulties with word finding: Denied. Decreased visuoperceptual ability: Denied.  Difficulties completing ADLs: Endorsed. Ms. Goens daughter stated that she manages and organizes her mother's medications, as well as provide reminders for when to take medications, as well as surrounding upcoming appointments. While Ms. Marcos manages her finances independently, her daughter stated that there have been times where she has forgotten or misplaced a bill payment. Ms. Turvey currently drives to nearby and familiar locations without issue.   Additional  Medical History: History of traumatic brain injury/concussion: Denied. History of stroke: Denied. History of seizure activity: Denied. History of known exposure to toxins: Denied. Symptoms of chronic pain: Endorsed. However, generalized aches and pains were largely described as manageable. Her daughter noted that her mother broke her shoulder in June 2019, leading to some pain-related symptoms, as well as pain in her back and legs.  Experience of frequent headaches/migraines: Denied. Frequent instances of dizziness/vertigo: Endorsed. Symptoms were said to occur sporadically without known triggers or antecedents. Ms. Rashad has medications to treat these symptoms as needed; however, she dislikes taking them due to medication side effects (i.e., increased sleepiness and fatigue).  Sensory changes: Ms. Schicker is hard of hearing and uses hearing aids with variable success. No other sensory changes/difficulties (e.g., vision, taste, or smell) were endorsed.  Balance/coordination difficulties: Endorsed. Balance instability was said to be ongoing for the past 3 years and has gradually worsened. These were attributed to symptoms of dizziness, as well as her various health concerns. Notably, despite an overall decline in the past 3 years, mild improvements were reported over the past 6-12 months. Other motor difficulties: Denied.  Sleep History: Estimated hours obtained each night: Unclear. This amount appears to vary considerably. Difficulties falling asleep: Endorsed. These were said to be sporadic in nature, where some nights Ms. Rice is awake for hours and unable to fall asleep. Other nights, she reported no issues falling asleep. Difficulties staying asleep: Endorsed. As stated above, these were said to be sporadic in nature. She will also experience trouble falling back asleep after being awoken to varying degrees.  Feels rested and refreshed upon awakening: Variably so, depending in the quality of  her sleep the night before.  History of snoring: Denied. History of waking up gasping for air: Denied. Witnessed breath cessation while asleep: Denied.  History of vivid dreaming: Denied. Excessive movement while asleep: Denied. Instances of acting out her dreams: Denied.  Psychiatric/Behavioral Health History: Depression: Endorsed. Ms. Whitelock reported a history of being diagnosed with depression in the past, but denied mediation intervention. She described her current mood as "up and down," with down periods related to feelings of isolation or going an entire day without speaking to someone. This has appeared to happen more frequently given the ongoing COVID-19 pandemic. When directly asked, she noted not feeling depressed at the time of the current evaluation. She denied prior work with an Haematologist. Current or remote suicidal ideation, intent, or plan was denied.  Anxiety: Denied. However, while never formally diagnosed, her daughter did state that Ms. Gladu has described experiencing physical sensations of anxiety in  the past; her mother was in agreement with this statement.  Mania: Denied. Trauma History: Denied. Visual/auditory hallucinations: Denied. Delusional thoughts: Denied. Mental health treatment: Denied.  Tobacco: Denied. Alcohol: Ms. Pendergraph denied current alcohol consumption, as well as a history of problematic alcohol use, abuse, or dependence.  Recreational drugs: Denied. Caffeine: Occasional Coke beverage. She generally consumes decaffeinated coffee and tea.   Academic/Vocational History: Highest level of educational attainment: 12 years. Ms. 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 Mathematics was noted as a relative weakness.   History of developmental delay: Denied. History of grade repetition: Denied. History of class failures: Denied. Enrollment in special education courses: Denied. History of diagnosed  specific learning disability: Denied. History of ADHD: Denied.  Employment: Retired. 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   Evaluation Results:   Behavioral Observations: Ms. Nason was accompanied by her daughter, arrived to her appointment on time, and was appropriately dressed and groomed. She ambulated slowly with the assistance of a cane, but did not require additional external support. Gross motor functioning appeared intact upon informal observation and no abnormal movements (e.g., tremors) were noted. She was hard of hearing and attempted to adjust her hearing aids throughout the clinical interview. Despite this, she experienced some trouble hearing and understanding questions being asked; her daughter was helpful in interpreting and aiding in history taking. Her affect was generally relaxed and positive, but did range appropriately given the subject being discussed during the clinical interview or the task at hand during testing procedures. Spontaneous speech was fluent and word finding difficulties were not observed during the clinical interview or testing procedures. Sustained attention was appropriate throughout. Thought processes were coherent, organized, and normal in content. Task engagement was adequate and she persisted when challenged. Overall, Ms. Claros was cooperative with the clinical interview and subsequent testing procedures.   Adequacy of Effort: The validity of neuropsychological testing is limited by the extent to which the individual being tested may be assumed to have exerted adequate effort during testing. Ms. Tates expressed her intention to perform to the best of her abilities and exhibited adequate task engagement and persistence. Scores across stand-alone and embedded performance validity measures were variable, but generally within expectation. Performance on one test (Dot Counting) was below  expectation, caused by Ms. Huebsch individually counting every dot, rather than grouping them mentally. Overall, results of the current evaluation are believed to be a valid representation of Ms. Partington's current cognitive functioning.  Test Results: Ms. Hack was fully oriented at the time of the current evaluation.  Intellectual abilities based upon educational and vocational attainment were estimated to be in the average range. Premorbid abilities were estimated to be within the well below average range based upon a single-word reading test.   Processing speed was exceptionally low to below average. Basic attention was above average. More complex attention (e.g., working memory) was exceptionally low to below average. Executive functioning was exceptionally low and represented a notable weakness across the current evaluation.  While not directly assessed, receptive language abilities are believed to be intact as Ms. Gritz did not exhibit any difficulties comprehending task instructions and answered all questions asked of her appropriately. Assessed expressive language (e.g., verbal fluency and confrontation naming) was variable. Semantic fluency was below average, while phonemic fluency was well below average. Confrontation naming was well below average normatively; however, Ms. Molyneux was able to correctly identify 26/31  words.     Basic visuoconstructional abilities were within normal limits. Points were lost on her drawing of a clock due to improper hand placement. More complex visuospatial/constructional abilities were exceptionally low to below average.    Learning (i.e., encoding) of novel verbal and visual information was below average to above average, with a noted strength across a contextual (i.e., story-based) verbal memory measure. Spontaneous delayed recall (i.e., retrieval) of previously learned information was commensurate with performance across initial learning trials. Retention  rates were appropriate across memory measures. Performance across recognition tasks was likewise appropriate, suggesting evidence for information consolidation.   Results of emotional screening instruments suggested that recent symptoms of generalized anxiety were in the mild range, while symptoms of depression were within the severe range. A screening instrument assessing recent sleep quality suggested the presence of mild sleep dysfunction.  Tables of Scores:   Note: This summary of test scores accompanies the interpretive report and should not be considered in isolation without reference to the appropriate sections in the text. Descriptors are based on appropriate normative data and may be adjusted based on clinical judgment. The terms impaired and within normal limits (WNL) are used when a more specific level of functioning cannot be determined. Descriptors refer to the current evaluation only.        Effort Testing:    DESCRIPTOR   March 2017 Current    Dot Counting Test: --- --- --- Below Expectation  WAIS-IV Reliable Digit Span: --- --- --- Within Expectation  CVLT-III Forced Choice Recognition: --- --- --- Within Expectation  BVMT-R Retention Percentage: --- --- --- Within Expectation        Orientation:       Raw Score Raw Score Percentile   NAB Orientation, Form 1 --- 29/29 --- ---        Intellectual Functioning:             Standard Score Standard Score Percentile   Test of Premorbid Functioning: --- 78 7 Well Below Average        Wechsler Adult Intelligence Scale (WAIS-IV) Short Form: Standard Score/ Scaled Score Standard Score/ Scaled Score Percentile   Full Scale IQ 88 85 16 Below Average    Similarities 8 8 25  Average    Digit Span 8 7 16  Below Average  *From [employer] (2009)            Memory:            Wechsler Memory Scale (WMS-IV):                       Scaled Score Raw Score (Scaled Score) Percentile     Logical Memory I --- 35/53 (12) 75 Above  Average    Logical Memory II --- 20/39 (11) 63 Average    Logical Memory Recognition --- 20/23 >75 Above Average        [place] Verbal Learning Test (CVLT-III) Brief Form: Raw Score Raw Score (Scaled/Standard Score) Percentile     Total Trials 1-4 --- 21/36 (81) 10 Below Average    Short-Delay Free Recall --- 6/9 (8) 25 Average    Long-Delay Free Recall --- 5/9 (7) 16 Below Average    Long-Delay Cued Recall --- 6/9 (8) 25 Average      Recognition Hits --- 9/9 (13) 84 Above Average      False Positive Errors --- 0 (12) 75 Above Average        Brief Visuospatial Memory Test (BVMT-R),  Form 1: Raw Score Raw Score (T Score) Percentile     Total Trials 1-3 --- 7/36 (39) 14 Below Average    Delayed Recall --- 3/12 (41) 18 Below Average    Recognition Discrimination Index --- 5 (48) 42 Average      Recognition Hits --- 5/6 (44) 27 Average      False Positive Errors --- 0 (54) 66 Average  *From Riki Sheer (2016)            Attention/Executive Function:            Trail Making Test (TMT): Raw Score Raw Score (T Score) Percentile     Part A 108 secs.,  1 error 100 secs.,  0 errors (21) <1 Exceptionally Low    Part B DC'D @ 300,  5 errors DC'D @ 300,  4 errors --- Impaired         Scaled Score Scaled Score Percentile   WAIS-IV Coding: 5 6 9  Below Average         Scaled Score Scaled Score Percentile   WAIS-IV Digit Span: 8 7 16  Below Average    Forward 15 13 84 Above Average    Backward 5 6 9  Below Average    Sequencing 4 2 <1 Exceptionally Low         Scaled Score Scaled Score Percentile   WAIS-IV Similarities: 8 8 25  Average        D-KEFS Color-Word Interference Test: Scaled Score Raw Score (Scaled Score) Percentile     Color Naming --- 43 secs. (6) 9 Below Average    Word Reading --- 33 secs. (6) 9 Below Average    Inhibition --- 105 secs. (5) 5 Well Below Average      Total Errors --- 29 errors (1) <1 Exceptionally Low    Inhibition/Switching --- 121 secs. (4) 2 Well Below  Average      Total Errors --- 16 errors (1) <1 Exceptionally Low        [place] Card Sorting Test: Raw Score Raw Score Percentile     Categories Completed 3 --- --- ---    Perseverative Errors 4 (27%) --- --- ---    Total Errors 17 (27%) --- --- ---        Language:            Verbal Fluency Test: Raw Score Raw Score (Z-Score) Percentile     Phonemic Fluency (FAS) 12 9 (-2.13) 2 Well Below Average    Animal Fluency 13 11 (-1.26) 11 Below Average  *Based on [employer] Older Normative Studies (MOANS)            NAB Language Module, Form 2: T Score T Score Percentile     Naming --- 26/31 (34) 5 Well Below Average        Visuospatial/Visuoconstruction:       Raw Score Raw Score Percentile   Clock Drawing: --- 8/10 --- Within Normal Limits  RCFT, Copy: 17/36 12/36 <1 Exceptionally Low        NAB Spatial Module, Form 2: T Score T Score Percentile     Visual Discrimination --- 37 9 Below Average         Scaled Score Scaled Score Percentile   WAIS-IV Block Design: 6 4 2  Well Below Average        Mood and Personality:       Raw Score Raw Score Percentile   Geriatric Depression Scale: 14 23 --- Severe  Geriatric Anxiety Scale: ---  14 --- Mild    Somatic --- 4 --- Minimal    Cognitive --- 5 --- Mild    Affective --- 5 --- Mild        Additional Questionnaires:       Raw Score Raw Score Percentile   PROMIS Sleep Disturbance Questionnaire: --- 28 --- Mild   Informed Consent and Coding/Compliance:   Ms. Arman was provided with a verbal description of the nature and purpose of the present neuropsychological evaluation. Also reviewed were the foreseeable risks and/or discomforts and benefits of the procedure, limits of confidentiality, and mandatory reporting requirements of this provider. The patient was given the opportunity to ask questions and receive answers about the evaluation. Oral consent to participate was provided by the patient.   This evaluation was conducted by Christia Reading, Ph.D., licensed clinical neuropsychologist. Ms. Bowie completed a 30-minute clinical interview, billed as one unit [phone], and 120 minutes of cognitive testing, billed as one unit [phone] and three additional units [phone]. Psychometrist Cruzita Lederer, B.S., assisted Dr. Melvyn Novas with test administration and scoring procedures. As a separate and discrete service, Dr. Melvyn Novas spent a total of 180 minutes in interpretation and report writing, billed as one unit [id number] and two units [id number].

## 2019-09-04 NOTE — Progress Notes (Signed)
   Neuropsychology Note   [name] completed 105 minutes of neuropsychological testing with technician, Cruzita Lederer, B.S., under the supervision of Dr. Christia Reading, Ph.D., licensed neuropsychologist. The patient did not appear overtly distressed by the testing session, per behavioral observation or via self-report to the technician. Rest breaks were offered.    In considering the patient's current level of functioning, level of presumed impairment, nature of symptoms, emotional and behavioral responses during the interview, level of literacy, and observed level of motivation/effort, a battery of tests was selected and communicated to the psychometrician.   Communication between the psychologist and technician was ongoing throughout the testing session and changes were made as deemed necessary based on patient performance on testing, technician observations and additional pertinent factors such as those listed above.   [name] will return within approximately two weeks for an interactive feedback session with Dr. Melvyn Novas at which time his test performances, clinical impressions, and treatment recommendations will be reviewed in detail. The patient understands she can contact our office should she require our assistance before this time.   Full report to follow.  105 minutes were spent face-to-face with patient administering standardized tests and 15 minutes were spent scoring (technician). [CPT [id number], [id number]]

## 2019-09-05 ENCOUNTER — Encounter: Payer: Self-pay | Admitting: Psychology

## 2019-09-05 DIAGNOSIS — I999 Unspecified disorder of circulatory system: Secondary | ICD-10-CM

## 2019-09-05 DIAGNOSIS — F01A0 Vascular dementia, mild, without behavioral disturbance, psychotic disturbance, mood disturbance, and anxiety: Secondary | ICD-10-CM

## 2019-09-05 DIAGNOSIS — F339 Major depressive disorder, recurrent, unspecified: Secondary | ICD-10-CM

## 2019-09-05 DIAGNOSIS — F0150 Vascular dementia without behavioral disturbance: Secondary | ICD-10-CM | POA: Insufficient documentation

## 2019-09-05 HISTORY — DX: Vascular dementia, mild, without behavioral disturbance, psychotic disturbance, mood disturbance, and anxiety: F01.A0

## 2019-09-05 HISTORY — DX: Vascular dementia without behavioral disturbance: F01.50

## 2019-09-05 HISTORY — DX: Unspecified disorder of circulatory system: I99.9

## 2019-09-05 HISTORY — DX: Major depressive disorder, recurrent, unspecified: F33.9

## 2019-09-10 NOTE — Progress Notes (Signed)
Remote pacemaker transmission.   

## 2019-09-14 ENCOUNTER — Encounter: Payer: Self-pay | Admitting: Psychology

## 2019-09-14 ENCOUNTER — Ambulatory Visit (INDEPENDENT_AMBULATORY_CARE_PROVIDER_SITE_OTHER): Payer: Medicare Other | Admitting: Psychology

## 2019-09-14 ENCOUNTER — Other Ambulatory Visit: Payer: Self-pay

## 2019-09-14 DIAGNOSIS — F339 Major depressive disorder, recurrent, unspecified: Secondary | ICD-10-CM

## 2019-09-14 DIAGNOSIS — F0150 Vascular dementia without behavioral disturbance: Secondary | ICD-10-CM

## 2019-09-14 DIAGNOSIS — F01A0 Vascular dementia, mild, without behavioral disturbance, psychotic disturbance, mood disturbance, and anxiety: Secondary | ICD-10-CM

## 2019-09-14 NOTE — Progress Notes (Signed)
   Neuropsychology Feedback Session [name]. [facility] of Neurology  Reason for Referral:   [name] a 78 y.o. 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  Feedback:   [name] completed a comprehensive neuropsychological evaluation on 09/04/2019. Please refer to that encounter for the full report. Briefly, results suggested primary impairments surrounding executive functioning and phonemic fluency, with additional weaknesses across processing speed, complex attention, and complex visuospatial/constructional abilities. Relative to her prior neuropsychological evaluation in March 2017, areas of weakness were largely stable. However, mild decline was observed across executive functioning and complex visuospatial tasks. Overall, given evidence for cognitive dysfunction, coupled with [name] report of variable ability to complete activities of daily living (ADLs), she continues to meet criteria for a mild vascular neurocognitive disorder (formerly "mild cognitive impairment"); however, she is likely towards the moderate to severe end of this spectrum currently.  [name] was accompanied by her daughter. Content of the current session focused on the results of her current evaluation. We also discussed mood-related concerns as [name] scored in the severe range across a depression questionnaire. She was encouraged to consider medication interventions and discuss these with Dr. Carles Collet or other members of her medical team. [name] and her daughter were given the opportunity to ask questions and their questions were answered. They were also encouraged to reach out should additional questions arise. A copy of her report was provided at the  conclusion of the visit.      A total of 20 minutes were spent with [name] during the current feedback session.

## 2019-09-28 ENCOUNTER — Other Ambulatory Visit: Payer: Self-pay | Admitting: Internal Medicine

## 2019-10-09 ENCOUNTER — Encounter: Payer: Self-pay | Admitting: Neurology

## 2019-10-09 NOTE — Progress Notes (Signed)
Virtual Visit via Video Note The purpose of this virtual visit is to provide medical care while limiting exposure to the novel coronavirus.    Consent was obtained for video visit:  Yes.   Answered questions that patient had about telehealth interaction:  Yes.   I discussed the limitations, risks, security and privacy concerns of performing an evaluation and management service by telemedicine. I also discussed with the patient that there may be a patient responsible charge related to this service. The patient expressed understanding and agreed to proceed.  Pt location: Home Physician Location: home Name of referring provider:  Leeroy Cha,* I connected with [name] at patients initiation/request on 10/10/2019 at 10:45 AM EST by video enabled telemedicine application and verified that I am speaking with the correct person using two identifiers. Pt MRN:  [id number] Pt DOB:  [birth year] Video Participants:  [name];  daughter present and supplements the history   History of Present Illness:  Patient seen today in follow-up for memory change.  She had neurocognitive testing with Dr. Melvyn Novas in November, 2020.  This demonstrated evidence of mild cognitive impairment.  This was similar to her findings in 2017.  Similar to 2017, the patient's testing demonstrated depression, this time in a severe range.  Psychotherapy was recommended.   Current Outpatient Medications on File Prior to Visit  Medication Sig Dispense Refill   acetaminophen (TYLENOL) 650 MG CR tablet Take 650 mg by mouth every 8 (eight) hours as needed for pain.     Ascorbic Acid (VITAMIN C PO) Take 1 tablet by mouth 3 (three) times a week.     carvedilol (COREG) 25 MG tablet Take 1 tablet (25 mg total) by mouth 2 (two) times daily. 60 tablet 3   Cyanocobalamin (VITAMIN B-12 PO) Take 1 tablet by mouth 3 (three) times a week.      docusate sodium (COLACE) 100 MG capsule Take 100 mg by mouth 2 (two) times  daily as needed for mild constipation or moderate constipation.     esomeprazole (NEXIUM) 20 MG capsule TAKE 1 CAPSULE BY MOUTH ONCE DAILY AT 12 NOON     ferrous sulfate 325 (65 FE) MG EC tablet Take 325 mg by mouth daily with breakfast.     gabapentin (NEURONTIN) 300 MG capsule Take 1 capsule (300 mg total) by mouth at bedtime. 30 capsule 2   LANTUS SOLOSTAR 100 UNIT/ML Solostar Pen Inject 22 Units into the skin daily.  6   levothyroxine (SYNTHROID) 88 MCG tablet Take 88 mcg by mouth daily before breakfast.      meclizine (ANTIVERT) 25 MG tablet Take 25 mg by mouth daily as needed for dizziness. For dizziness     Multiple Vitamins-Minerals (PRESERVISION AREDS 2 PO) Take 1 tablet by mouth every evening.      Polyethyl Glycol-Propyl Glycol (SYSTANE OP) Place 1 drop into both eyes 2 (two) times daily.      Rosuvastatin Calcium 10 MG CPSP Take 10 mg by mouth daily.      torsemide (DEMADEX) 20 MG tablet Take 1 tablet (20 mg total) by mouth daily. 30 tablet 2   No current facility-administered medications on file prior to visit.     Observations/Objective:   There were no vitals filed for this visit. GEN:  The patient appears stated age and is in NAD.  Neurological examination:  Orientation:  Montreal Cognitive Assessment  08/01/2019  Visuospatial/ Executive (0/5) 2  Naming (0/3) 3  Attention: Read list of digits (  0/2) 2  Attention: Read list of letters (0/1) 1  Attention: Serial 7 subtraction starting at 100 (0/3) 1  Language: Repeat phrase (0/2) 1  Language : Fluency (0/1) 0  Abstraction (0/2) 0  Delayed Recall (0/5) 3  Orientation (0/6) 6  Total 19  Adjusted Score (based on education) 20   Cranial nerves: There is good facial symmetry. There is no facial hypomimia.  The speech is fluent and clear. Soft palate rises symmetrically and there is no tongue deviation. Hearing is intact to conversational tone.   Lab Results  Component Value Date   VITAMINB12 788 05/19/2019       Chemistry      Component Value Date/Time   NA 138 05/21/2019 0228   NA 139 10/09/2018 0947   K 4.5 05/21/2019 0228   CL 108 05/21/2019 0228   CO2 19 (L) 05/21/2019 0228   BUN 54 (H) 05/21/2019 0228   BUN 62 (H) 10/09/2018 0947   CREATININE 1.90 (H) 05/22/2019 0930   CREATININE 1.59 (H) 09/22/2016 1202      Component Value Date/Time   CALCIUM 7.8 (L) 05/21/2019 0228   ALKPHOS 67 05/15/2019 0500   AST 32 05/15/2019 0500   ALT 17 05/15/2019 0500   BILITOT 0.8 05/15/2019 0500   BILITOT 0.2 10/31/2017 1108        Assessment and Plan:   1.  Memory change, consistent with mild cognitive impairment             -Had neuropsych testing in 12/2015.  [facility] only noted.    Testing was repeated in November, 2020, with the same results.  Discussed results with the patient, which they have already discussed with Dr. Melvyn Novas.  The biggest influence her in her memory issues is likely severe depression.  Discussed with patient and daughter that psychotherapy and a potential trial of medication was recommended.  This is out of my field of expertise and the patient should follow-up with her primary care physician in that regard.  I am happy to provide a referral to counseling if she would like.  She will let me know if wants that (did not take referral today) 2.  f/u prn    Follow Up Instructions:    -I discussed the assessment and treatment plan with the patient. The patient was provided an opportunity to ask questions and all were answered. The patient agreed with the plan and demonstrated an understanding of the instructions.   The patient was advised to call back or seek an in-person evaluation if the symptoms worsen or if the condition fails to improve as anticipated.    Total Time spent in visit with the patient was:  13  min, of which more than 50% of the time was spent in counseling.   Pt understands and agrees with the plan of care outlined.     Alonza Bogus, DO

## 2019-10-10 ENCOUNTER — Telehealth (INDEPENDENT_AMBULATORY_CARE_PROVIDER_SITE_OTHER): Payer: Medicare Other | Admitting: Neurology

## 2019-10-10 ENCOUNTER — Other Ambulatory Visit: Payer: Self-pay

## 2019-10-10 ENCOUNTER — Other Ambulatory Visit: Payer: Self-pay | Admitting: Internal Medicine

## 2019-10-10 DIAGNOSIS — F0150 Vascular dementia without behavioral disturbance: Secondary | ICD-10-CM

## 2019-10-10 DIAGNOSIS — F339 Major depressive disorder, recurrent, unspecified: Secondary | ICD-10-CM | POA: Diagnosis not present

## 2019-10-10 DIAGNOSIS — F01A0 Vascular dementia, mild, without behavioral disturbance, psychotic disturbance, mood disturbance, and anxiety: Secondary | ICD-10-CM

## 2019-10-10 MED ORDER — TORSEMIDE 20 MG PO TABS: 20.0000 mg | ORAL_TABLET | Freq: Every day | ORAL | 11 refills | Status: DC

## 2019-10-10 MED ORDER — CARVEDILOL 25 MG PO TABS: 25.0000 mg | ORAL_TABLET | Freq: Two times a day (BID) | ORAL | 11 refills | Status: DC

## 2019-10-10 NOTE — Telephone Encounter (Signed)
   Patient has no medication remaining   *STAT* If patient is at the pharmacy, call can be transferred to refill team.   1. Which medications need to be refilled? (please list name of each medication and dose if known)  torsemide (DEMADEX) 20 MG tablet carvedilol (COREG) 25 MG tablet  2. Which pharmacy/location (including street and city if local pharmacy) is medication to be sent to? [facility], [place] [address]  3. Do they need a 30 day or 90 day supply? [place]

## 2019-10-10 NOTE — Telephone Encounter (Signed)
Refills sent as requested  ./cy °

## 2019-11-20 ENCOUNTER — Ambulatory Visit (INDEPENDENT_AMBULATORY_CARE_PROVIDER_SITE_OTHER): Payer: Medicare Other | Admitting: *Deleted

## 2019-11-20 DIAGNOSIS — I495 Sick sinus syndrome: Secondary | ICD-10-CM

## 2019-11-20 LAB — CUP PACEART REMOTE DEVICE CHECK
Battery Remaining Longevity: 121 mo
Battery Remaining Percentage: 95.5 %
Battery Voltage: 2.99 V
Brady Statistic AP VP Percent: 1 %
Brady Statistic AP VS Percent: 98 %
Brady Statistic AS VP Percent: 1 %
Brady Statistic AS VS Percent: 1 %
Brady Statistic RA Percent Paced: 98 %
Brady Statistic RV Percent Paced: 1 %
Date Time Interrogation Session: 20210126031940
Implantable Lead Implant Date: 20171019
Implantable Lead Implant Date: 20171019
Implantable Lead Location: 753859
Implantable Lead Location: 753860
Implantable Pulse Generator Implant Date: 20171019
Lead Channel Impedance Value: 430 Ohm
Lead Channel Impedance Value: 760 Ohm
Lead Channel Pacing Threshold Amplitude: 0.5 V
Lead Channel Pacing Threshold Amplitude: 0.75 V
Lead Channel Pacing Threshold Pulse Width: 0.4 ms
Lead Channel Pacing Threshold Pulse Width: 0.4 ms
Lead Channel Sensing Intrinsic Amplitude: 12 mV
Lead Channel Sensing Intrinsic Amplitude: 2 mV
Lead Channel Setting Pacing Amplitude: 2 V
Lead Channel Setting Pacing Amplitude: 2.5 V
Lead Channel Setting Pacing Pulse Width: 0.4 ms
Lead Channel Setting Sensing Sensitivity: 2 mV
Pulse Gen Model: 2272
Pulse Gen Serial Number: 3180458

## 2019-12-12 ENCOUNTER — Other Ambulatory Visit: Payer: Self-pay | Admitting: Internal Medicine

## 2020-01-04 ENCOUNTER — Encounter: Payer: Self-pay | Admitting: Internal Medicine

## 2020-01-04 ENCOUNTER — Ambulatory Visit: Payer: Medicare Other | Admitting: Internal Medicine

## 2020-01-04 ENCOUNTER — Other Ambulatory Visit: Payer: Self-pay

## 2020-01-04 VITALS — BP 140/60 | HR 64 | Temp 97.3°F | Ht 60.0 in | Wt 135.2 lb

## 2020-01-04 DIAGNOSIS — R0602 Shortness of breath: Secondary | ICD-10-CM

## 2020-01-04 DIAGNOSIS — I4892 Unspecified atrial flutter: Secondary | ICD-10-CM

## 2020-01-04 DIAGNOSIS — I480 Paroxysmal atrial fibrillation: Secondary | ICD-10-CM

## 2020-01-04 DIAGNOSIS — I5032 Chronic diastolic (congestive) heart failure: Secondary | ICD-10-CM

## 2020-01-04 DIAGNOSIS — I495 Sick sinus syndrome: Secondary | ICD-10-CM | POA: Diagnosis not present

## 2020-01-04 DIAGNOSIS — R0789 Other chest pain: Secondary | ICD-10-CM

## 2020-01-04 DIAGNOSIS — I25709 Atherosclerosis of coronary artery bypass graft(s), unspecified, with unspecified angina pectoris: Secondary | ICD-10-CM

## 2020-01-04 NOTE — Patient Instructions (Addendum)
Medication Instructions:  No changes *If you need a refill on your cardiac medications before your next appointment, please call your pharmacy*  Lab Work: Your physician recommends that you return for lab work today (CBC, BNP, CMP)  Testing/Procedures: Your physician has requested that you have a lexiscan myoview. For further information please visit [email or web address]. Please follow instruction sheet, as given.  Follow-Up: At [facility], you and your health needs are our priority.  As part of our continuing mission to provide you with exceptional heart care, we have created designated Provider Care Teams.  These Care Teams include your primary Cardiologist (physician) and Advanced Practice Providers (APPs -  Physician Assistants and Nurse Practitioners) who all work together to provide you with the care you need, when you need it.  We recommend signing up for the patient portal called "MyChart".  Sign up information is provided on this After Visit Summary.  MyChart is used to connect with patients for Virtual Visits (Telemedicine).  Patients are able to view lab/test results, encounter notes, upcoming appointments, etc.  Non-urgent messages can be sent to your provider as well.   To learn more about what you can do with MyChart, go to [email or web address].    Your next appointment:   1 month(s)  The format for your next appointment:   In Person  Provider:   K. Mali Hilty, MD

## 2020-01-05 ENCOUNTER — Encounter: Payer: Self-pay | Admitting: Internal Medicine

## 2020-01-05 LAB — COMPREHENSIVE METABOLIC PANEL
ALT: 13 IU/L (ref 0–32)
AST: 20 IU/L (ref 0–40)
Albumin/Globulin Ratio: 1.8 (ref 1.2–2.2)
Albumin: 4.4 g/dL (ref 3.7–4.7)
Alkaline Phosphatase: 91 IU/L (ref 39–117)
BUN/Creatinine Ratio: 24 (ref 12–28)
BUN: 50 mg/dL — ABNORMAL HIGH (ref 8–27)
Bilirubin Total: 0.2 mg/dL (ref 0.0–1.2)
CO2: 23 mmol/L (ref 20–29)
Calcium: 9.1 mg/dL (ref 8.7–10.3)
Chloride: 102 mmol/L (ref 96–106)
Creatinine, Ser: 2.11 mg/dL — ABNORMAL HIGH (ref 0.57–1.00)
GFR calc Af Amer: 25 mL/min/{1.73_m2} — ABNORMAL LOW (ref >59)
GFR calc non Af Amer: 22 mL/min/{1.73_m2} — ABNORMAL LOW (ref >59)
Globulin, Total: 2.5 g/dL (ref 1.5–4.5)
Glucose: 230 mg/dL — ABNORMAL HIGH (ref 65–99)
Potassium: 5.4 mmol/L — ABNORMAL HIGH (ref 3.5–5.2)
Sodium: 142 mmol/L (ref 134–144)
Total Protein: 6.9 g/dL (ref 6.0–8.5)

## 2020-01-05 LAB — CBC
Hematocrit: 32.6 % — ABNORMAL LOW (ref 34.0–46.6)
Hemoglobin: 11 g/dL — ABNORMAL LOW (ref 11.1–15.9)
MCH: 32.7 pg (ref 26.6–33.0)
MCHC: 33.7 g/dL (ref 31.5–35.7)
MCV: 97 fL (ref 79–97)
Platelets: 183 10*3/uL (ref 150–450)
RBC: 3.36 x10E6/uL — ABNORMAL LOW (ref 3.77–5.28)
RDW: 12.3 % (ref 11.7–15.4)
WBC: 7.8 10*3/uL (ref 3.4–10.8)

## 2020-01-05 LAB — BRAIN NATRIURETIC PEPTIDE: BNP: 619.4 pg/mL — ABNORMAL HIGH (ref 0.0–100.0)

## 2020-01-05 NOTE — Progress Notes (Signed)
OFFICE NOTE  Chief Complaint:  Progressive dyspnea  Primary Care Physician: [name] Cha, MD  HPI:  [name] is a pleasant 79 year old female with history of coronary disease and CABG x 3 vessels in 2013, LIMA to LAD, SVG to OM and SVG to right coronary. She underwent an echocardiogram which demonstrates normal EF of 55% to 65%. However, there is stage 1 diastolic dysfunction and evidence for elevated mean left atrial filling pressure. There were mildly thickened calcified aortic valve leaflets but no stenosis and mild tricuspid regurgitation. At the time, I started her on Lasix 20 mg daily. A BMP was obtained that day which indicated mild elevation at 75.5, creatinine was 1.16. She has been taking the Lasix with improvement in her lower extremity swelling although shortness of breath seems to be about the same. Again, she notes it is only with marked exertion, not necessarily with normal activities. However, she does have ongoing fatigue and easy fatigability. This may be due to just poor exercise tolerance. She also reported that recently you started her on a new SLGT2 diabetic medicine and she said that she took several doses of that, which I believe was Invokana, and she became markedly dizzy with that. She has since stopped that medication. Again, overall the patient is fairly stable with a normal ejection fraction. She recently had bypass and is not having any anginal symptoms and therefore I do not suspect that this is an issue related to her bypass grafts.   I saw [name] today in the office for an acute visit regarding what is described as abdominal pain. She reports a numbness and pain sensation over the right upper quadrant. She's also noted some firmness and distention of her abdomen. This is recently presented to a GI doctor in [place] who thought she might have a gastric ulcer. He performed endoscopy which was a reportedly unrevealing. He also recommended she is  take MiraLAX for constipation. She says that recently she's been constipated but also has been having some small amount of loose stool. She did not want to take the MiraLAX given the loose stool. She also reports some of the discomfort extends across the mid epigastrium and into the lower chest. It is entirely atypical for cardiac chest pain. She reports that she had a colonoscopy in the past by Dr. Henrene Walker about 5 years ago and a repeat colonoscopy was recommended around 2018. She also tells me that she recently had a right upper quadrant ultrasound performed at [facility] after presenting with abdominal pain which was negative for acute biliary disease.  [name] returns today for follow-up. Overall she seems to be doing well from a cardiac standpoint. I understand she is switched primary care providers to [facility] primary care, however she is unclear of her primary care provider. She does have an upcoming appointment to see [name] neurology. Recently she's been having trouble with forgetfulness and it seems to be getting worse. She tells me she lost her cell phone at home and has not been able to locate it. She denies any chest pain or shortness of breath and in fact her flank back and abdominal pain which was evaluated extensively has now gone away.  I had the pleasure of seeing [name] back today in follow-up. It's been 6 months and she'll last appointment. Her main concern today is leg weakness. She reports she has some back pain and associated weakness in her legs that give out from time  to time. She recently had memory testing indicating possible dementia as well as likely depression. It was recommended that she go on therapy however she declined medications. She denies any chest pain or worsening shortness of breath.  07/01/2016  [name] returns today for follow-up. She was recently hospitalized for acute diastolic heart failure. She was treated with diuretics and  adjustments are made to her blood pressure medications. She had a repeat echocardiogram which was essentially stable showed normal LV EF and diastolic dysfunction. After discharge she underwent nuclear stress testing which was negative for ischemia. She saw [name] Bayley, NP, who made some changes to her blood pressure medications including placing her on clonidine. Since taking this medicine she has had some of the typical symptoms of fatigue and dry mouth as well as constipation. She was alert initially taking clonidine 2 mg twice daily. She then decreased it to one half tablet or 1 mg twice daily but blood pressure remains high. I checked her blood pressure manually today 160/60. She reports fatigue and low energy.  09/22/2016  [name] returns today for hospital follow-up. 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 She had a St. Jude surety MRI safe pacemaker placed. She was also started on amiodarone and underwent cardioversion. She seems to be maintaining sinus rhythm. She reports improvement in her energy. She recently saw [name] Bayley, NP, in follow-up who reported she was doing well although noted her blood pressure was more elevated. She was placed on doxazosin 4 mg daily and had initial improvement in blood pressure however blood pressure has since gone up. Today was 186/62. Unfortunately, she's been intolerant of a number of blood pressure medications in the past.  12/23/2016  [name] was seen in follow-up today. She saw Dr. candidates in January who noted that she reported being shaky on amiodarone and he decreased the dose down to 100 mg daily. She remains in an AV paced rhythm but there has been recurrent underlying atrial flutter. She is also had some fatigue. The pressure is elevated today 156/70. I reviewed home monitoring indicates blood pressures continue to be elevated. She seems to be okay with these numbers but she  is at increased risk for events. Unfortunate she's had intolerance to number of medications.  04/22/2017  [name] returns today for follow-up. She recently saw Almyra Deforest, PA-C, who noted that she had had some weight gain and lower extremity edema. He discontinued her chlorthalidone and put her on low-dose Lasix. She's had improvement in her swallowing with this and some weight loss. She then followed up with Dr. Moshe Cipro at [facility]. Her creatinine was noted to be 2.7 and she is undergoing a renal ultrasound and further workup for this. She was also noted to be anemic with a low iron saturation. She's been receiving some IV iron. She seems to be maintaining sinus rhythm on low-dose amiodarone. Recently she saw her endocrinologist who had increased her Synthroid. Her last TSH was between 7 and 8. She is due for repeat check of that. She has done well with the placement of a pacemaker in the fall. She has remote checks to follow that.  08/25/2017  [name] was seen today in follow-up.  Recently she had about 12 pound weight gain and saw her nephrologist.  She was started back on Lasix and was noted to have an elevated creatinine now back up to 2.14.  In addition her blood pressure was running low, specifically  her diastolic blood pressure and her amlodipine was decreased to 5 mg daily.  She seems to be maintaining sinus rhythm and is on low-dose amiodarone.  She is in a paced rhythm today and recent remote check showed no real evidence of atrial fibrillation.  She was on Xarelto however stopped that on her own recently because of fatigue and weakness.  She was also noted to be anemic and has been started on iron.  This begs a question of whether she may be having microscopic bleeding or whether her anemia was related to chronic kidney disease.  Nevertheless, she has an appropriately elevated chads vascular score and should be anticoagulated on Xarelto.  10/26/2017  [name] returns today  for follow-up.  She again is gained about 5 or 6 pounds.  Apparently she had seen her nephrologist and was started back on Lasix however her creatinine is rising.  I repeated labs on October 10, 2017 which showed a creatinine had increased up to 2.24, previously 1.9 and 2.45.  Her pro-BNP was elevated at 1437.  Since that time she has had some worsening shortness of breath, lower extremity swelling and increasing abdominal girth.  Blood pressure is quite elevated today 190/70, however she says is been lower at home.  06/12/2018  [name] is again seen today in follow-up.  She has had some increased abdominal girth but no significant swelling or edema.  Remote pacemaker checks have been performed which showed normal battery life and adequate pacer function.  She is noted today to be in an atrial paced rhythm with prolonged AV conduction at 60.  She denies any chest pain or worsening shortness of breath. She was recently seen by Dr. Moshe Cipro at [facility].  It is felt that her creatinine has been somewhat stable.  11/27/2018  [name] seen today for follow-up of left heart catheterization.  This occurred in December after having symptoms of progressive shortness of breath and chest tightness.  She did have a stress test in November 2019 which showed a new area of ischemia in the OM territory.  She underwent left heart catheterization which showed high-grade graft disease with 95% ostial stenosis in the graft to the marginal vessel which likely accounted for the nuclear study changes.  However, given her chronic kidney disease stage PCI was recommended.  She went back for PCI in December 2019 which was successful.  Since then she is done fairly well.  Immediately had some improvement in shortness of breath and discomfort however has had some recurrent shortness of breath.  She has had some improvement in dyslipidemia now after restarting rosuvastatin 5 mg daily however LDL remains elevated at 114.   She previously could not tolerate higher doses of statins however I am not sure as she has been on high potency rosuvastatin.  She also remains on Plavix and Xarelto which I think may be a lifelong combination for her.  Also, her blood pressure has been running little higher after stopping her amlodipine recently because of lower blood pressure.  08/15/2019  [name] is seen today in follow-up with her daughter.  Overall she continues to do well without recurrent chest pain after PCI last year.  She is on Plavix and Xarelto and denies any significant bleeding issues on that.  She also takes daily torsemide.  EKG shows an atrial paced rhythm today.  He recently has had some hearing difficulties but that is being addressed.  Blood pressure is at times labile however has been  reasonably well controlled.  Although the systolics a little higher she does have a wide pulse pressure and I would be hesitant to be real aggressive about her treatment as to not lower her diastolics any further.  Today her diastolic pressure was 48.  01/04/2020  [name] returns today for follow-up.  She is again accompanied by her daughter.  After some discussion she reports she has had some progressive dyspnea on exertion.  This is probably been over the past several months however to her daughter is much more noticeable.  She gets some pressure in her chest but no pain.  This is associated with exertion and relieved at rest.  PMHx:  Past Medical History:  Diagnosis Date  . Anemia   . Anxiety   . Arthritis    "in my hands; knees, back" (09/27/2018)  . CAD (coronary artery disease)    a. 40-59% bilaterally 10/2015.  Marland Kitchen Chronic diastolic CHF (congestive heart failure) ([place])    a. 05/2016 Echo: EF 60-65%, no rwma, Gr1 DD, Ao sclerosis w/o stenosis, triv MR;  b. 07/2016 TEE: EF 55-60%, no rwma, mild MR.  . CKD (chronic kidney disease), stage III   . Coronary artery disease    a. 02/2007 Persantine MV: low risk;  b. 11/2011  CABG x 3 (LIMA->LAD, VG->OM, VG->RCA);  c. 05/2016 MV: EF >65%, no isch/infarct, horiz ST dep in I, II, V5-V6.  Marland Kitchen Depression   . Diverticulosis   . Esophageal stricture   . GERD (gastroesophageal reflux disease)   . Hemorrhoids   . Hiatal hernia   . Hyperkalemia    a. ARB stopped due to this.  . Hyperlipidemia   . Hypertension   . Hypertensive heart disease   . Hypothyroidism   . Major depressive disorder with anxious distress 09/05/2019  . Mild cognitive impairment    a. seen by neurology.  . Mild vascular neurocognitive disorder ([place]) 09/05/2019  . Myocardial infarction ([place]) 12/07/2011  . PAF (paroxysmal atrial fibrillation) ([facility])    a. post-op CABG.  . Pain    RIGHT KNEE PAIN - TORN RIGHT MEDIAL MENISCUS  . Paroxysmal atrial flutter ([place])    a. 07/2016 s/p TEE & DCCV;  b. 07/2016 Recurrent PAFlutter req initiation of amio & PPM in setting of tachy-brady;  c. CHA2DS2VASc = 7-->Xarelto 15 mg QD.  Marland Kitchen Pneumonia    "twice" (09/27/2018)  . PONV (postoperative nausea and vomiting)   . Presence of permanent cardiac pacemaker 08/12/2016  . S/P CABG (coronary artery bypass graft), 12/04/11 12/07/2011   LIMA to LAD, SVG to OM, SVG to RCA  . Sinus bradycardia    a. not on BB due to this.  . Skin cancer    "face" (09/27/2018)  . Tachy-brady syndrome ([place])    a. 07/2016 Jxnl brady following DCCV, recurrent Aflutter-->amio + SJM 2272 Assurity MRI DC PPM (ser # [id number]).  . Type II diabetes mellitus ([place])     Past Surgical History:  Procedure Laterality Date  . ABDOMINAL HYSTERECTOMY  1980's  . ANKLE FRACTURE SURGERY Right    "put pins both side right ankle"  . BACK SURGERY  2006   "cyst growing near my spine"  . CARDIAC CATHETERIZATION  12/02/2011   mild LV dysfunction with mod hypocontractility of mid-distal anterolateral wall; CAD w/ostial tapering of L [place] with 50% diffuse ostial narrowing of LAD, 99% eccentric focal prox LAD stenosis followed by 70% prox LAD stenosis after 1st diag,  20% mid LAD narrowing; 80% ostial-to-prox L  Cfx stenosis & 40-50% irregularity of RCA (Dr. Corky Downs)  . CARDIOVERSION N/A 08/11/2016   Procedure: CARDIOVERSION;  Surgeon: Lelon Perla, MD;  Location: [facility] ENDOSCOPY;  Service: Cardiovascular;  Laterality: N/A;  . CATARACT EXTRACTION W/ INTRAOCULAR LENS  IMPLANT, BILATERAL Bilateral ~ 2010  . [place]  . CORONARY ARTERY BYPASS GRAFT  12/04/2011   Procedure: CORONARY ARTERY BYPASS GRAFTING (CABG);  Surgeon: Tharon Aquas Adelene Idler, MD;  Location: [place];  Service: Open Heart Surgery;  Laterality: N/A;  CABG x three,  using left internal mammary artery, and right leg greater saphenous vein harvested endoscopically  . CORONARY STENT INTERVENTION N/A 09/27/2018   Procedure: CORONARY STENT INTERVENTION;  Surgeon: Troy Sine, MD;  Location: [place] CV LAB;  Service: Cardiovascular;  Laterality: N/A;  . DILATION AND CURETTAGE OF UTERUS     "a couple times"  . EP IMPLANTABLE DEVICE N/A 08/12/2016   Procedure: Pacemaker Implant;  Surgeon: Will Meredith Leeds, MD;  Location: [place] CV LAB;  Service: Cardiovascular;  Laterality: N/A;  . ESOPHAGOGASTRODUODENOSCOPY (EGD) WITH ESOPHAGEAL DILATION    . FRACTURE SURGERY    . JOINT REPLACEMENT    . KNEE ARTHROSCOPY WITH MEDIAL MENISECTOMY Right 07/02/2014   Procedure: RIGHT KNEE ARTHROSCOPY WITH PARTIAL MEDIAL MENISTECTOMY, ABRASION CONDROPLASTYU OF PATELLA,ABRASION CONDROPLASTY OF MEDIAL FEMEROL CONDYL, MICROFRACTURE OF MEDIAL FEMEROL CONDYL;  Surgeon: Tobi Bastos, MD;  Location: [facility] ORS;  Service: Orthopedics;  Laterality: Right;  . LEFT HEART CATH AND CORS/GRAFTS ANGIOGRAPHY N/A 09/06/2018   Procedure: LEFT HEART CATH AND CORS/GRAFTS ANGIOGRAPHY;  Surgeon: Troy Sine, MD;  Location: [place] CV LAB;  Service: Cardiovascular;  Laterality: N/A;  . LEFT HEART CATHETERIZATION WITH CORONARY ANGIOGRAM N/A 12/02/2011   Procedure: LEFT HEART CATHETERIZATION WITH CORONARY ANGIOGRAM;   Surgeon: Troy Sine, MD;  Location: [facility] CATH LAB;  Service: Cardiovascular;  Laterality: N/A;  Coronary angiogram, possible PCI  . TEE WITHOUT CARDIOVERSION N/A 08/11/2016   Procedure: TRANSESOPHAGEAL ECHOCARDIOGRAM (TEE);  Surgeon: Lelon Perla, MD;  Location: [facility] ENDOSCOPY;  Service: Cardiovascular;  Laterality: N/A;  . TEE WITHOUT CARDIOVERSION N/A 05/18/2019   Procedure: TRANSESOPHAGEAL ECHOCARDIOGRAM (TEE);  Surgeon: [name], MD;  Location: [place];  Service: Cardiovascular;  Laterality: N/A;  . TONSILLECTOMY  1949  . TOTAL KNEE ARTHROPLASTY Left ~ 2006  . TRANSTHORACIC ECHOCARDIOGRAM  02/19/2013   EF 62-13%, grade 1 diastolic dysfunction; mildly thickend/calcified AV leaflets; mildly calcidied MV annulus; mild TR    FAMHx:  Family History  Problem Relation Age of Onset  . Diabetes Mother   . CVA Mother   . Hypertension Mother   . Heart disease Father   . Hyperlipidemia Father   . Breast cancer Sister        x 3  . Heart disease Brother        x5; one with MI  . Heart disease Sister        x3  . Diabetes Sister        x3  . Lung cancer Sister   . Breast cancer Sister        x2  . Colon cancer Neg Hx     SOCHx:   reports that she has never smoked. She has never used smokeless tobacco. She reports previous alcohol use. She reports that she does not use drugs.  ALLERGIES:  Allergies  Allergen Reactions  . Clonidine Derivatives Other (See Comments)    Bradycardia and fatigue   .  Sulfa Antibiotics Other (See Comments)    Unknown  . Crestor [Rosuvastatin Calcium] Other (See Comments)    Other reaction(s): tired and weak  . Epinephrine Other (See Comments)    Abnormal feeling. Dental exam/injection of local w/ epi.  Marland Kitchen Hydralazine Other (See Comments)    Nausea/gi upset   . Losartan Other (See Comments)    Hyperkalemia   . Other Other (See Comments)    MANGO'S - WELTS ALL OVER    ROS: Pertinent items noted in HPI and remainder of comprehensive  ROS otherwise negative.  HOME MEDS: Current Outpatient Medications  Medication Sig Dispense Refill  . acetaminophen (TYLENOL) 650 MG CR tablet Take 650 mg by mouth every 8 (eight) hours as needed for pain.    . Ascorbic Acid (VITAMIN C PO) Take 1 tablet by mouth 3 (three) times a week.    . carvedilol (COREG) 25 MG tablet Take 1 tablet (25 mg total) by mouth 2 (two) times daily. 60 tablet 11  . Cyanocobalamin (VITAMIN B-12 PO) Take 1 tablet by mouth 3 (three) times a week.     . docusate sodium (COLACE) 100 MG capsule Take 100 mg by mouth 2 (two) times daily as needed for mild constipation or moderate constipation.    Marland Kitchen esomeprazole (NEXIUM) 20 MG capsule TAKE 1 CAPSULE BY MOUTH ONCE DAILY AT 12 NOON    . ferrous sulfate 325 (65 FE) MG EC tablet Take 325 mg by mouth daily with breakfast.    . gabapentin (NEURONTIN) 300 MG capsule Take 1 capsule (300 mg total) by mouth at bedtime. 30 capsule 2  . LANTUS SOLOSTAR 100 UNIT/ML Solostar Pen Inject 22 Units into the skin daily.  6  . levothyroxine (SYNTHROID) 88 MCG tablet Take 88 mcg by mouth daily before breakfast.     . meclizine (ANTIVERT) 25 MG tablet Take 25 mg by mouth daily as needed for dizziness. For dizziness    . Multiple Vitamins-Minerals (PRESERVISION AREDS 2 PO) Take 1 tablet by mouth every evening.     [name] Glycol-Propyl Glycol (SYSTANE OP) Place 1 drop into both eyes 2 (two) times daily.     . Rosuvastatin Calcium 10 MG CPSP Take 10 mg by mouth daily.     Marland Kitchen torsemide (DEMADEX) 20 MG tablet Take 1 tablet (20 mg total) by mouth daily. 30 tablet 11   No current facility-administered medications for this visit.    LABS/IMAGING: Results for orders placed or performed in visit on 01/04/20 (from the past 48 hour(s))  Brain natriuretic peptide     Status: Abnormal   Collection Time: 01/04/20  4:40 PM  Result Value Ref Range   BNP 619.4 (H) 0.0 - 100.0 pg/mL  CBC     Status: Abnormal   Collection Time: 01/04/20  4:40 PM    Result Value Ref Range   WBC 7.8 3.4 - 10.8 x10E3/uL   RBC 3.36 (L) 3.77 - 5.28 x10E6/uL   Hemoglobin 11.0 (L) 11.1 - 15.9 g/dL   Hematocrit 32.6 (L) 34.0 - 46.6 %   MCV 97 79 - 97 fL   MCH 32.7 26.6 - 33.0 pg   MCHC 33.7 31.5 - 35.7 g/dL   RDW 12.3 11.7 - 15.4 %   Platelets 183 150 - 450 x10E3/uL  Comprehensive metabolic panel     Status: Abnormal   Collection Time: 01/04/20  4:40 PM  Result Value Ref Range   Glucose 230 (H) 65 - 99 mg/dL   BUN 50 (H)  8 - 27 mg/dL   Creatinine, Ser 2.11 (H) 0.57 - 1.00 mg/dL   GFR calc non Af Amer 22 (L) >59 mL/min/1.73   GFR calc Af Amer 25 (L) >59 mL/min/1.73   BUN/Creatinine Ratio 24 12 - 28   Sodium 142 134 - 144 mmol/L   Potassium 5.4 (H) 3.5 - 5.2 mmol/L   Chloride 102 96 - 106 mmol/L   CO2 23 20 - 29 mmol/L   Calcium 9.1 8.7 - 10.3 mg/dL   Total Protein 6.9 6.0 - 8.5 g/dL   Albumin 4.4 3.7 - 4.7 g/dL   Globulin, Total 2.5 1.5 - 4.5 g/dL   Albumin/Globulin Ratio 1.8 1.2 - 2.2   Bilirubin Total 0.2 0.0 - 1.2 mg/dL   Alkaline Phosphatase 91 39 - 117 IU/L   AST 20 0 - 40 IU/L   ALT 13 0 - 32 IU/L   No results found.  VITALS: BP 140/60   Pulse 64   Temp (!) 97.3 F (36.3 C)   Ht 5' (1.524 m)   Wt 135 lb 3.2 oz (61.3 kg)   SpO2 98%   BMI 26.40 kg/m   EXAM: General appearance: alert and no distress Neck: no carotid bruit and no JVD Lungs: clear to auscultation bilaterally Heart: regular rate and rhythm, S1, S2 normal, no murmur, click, rub or gallop Abdomen: soft, non-tender; bowel sounds normal; no masses,  no organomegaly Extremities: edema Trace to 1+ bilateral lower extremity edema Pulses: 2+ and symmetric Skin: Skin color, texture, turgor normal. No rashes or lesions Neurologic: Grossly normal  EKG: Atrial paced rhythm at 64, QTc 478 ms-personally reviewed-personally reviewed  ASSESSMENT: 1. Progressive DOE 2. Unstable angina with recent PCI to the SVG to marginal graft (2019) 3. Coronary artery disease status  post CABG x 3 (LIMA to LAD, SVG to Diag, SVG to RCA) in 2013 4. Paroxysmal atrial flutter-CHADSVASC score of 5 on Xarelto 5. Tachybradycardia syndrome status post St. Jude permanent pacemaker (07/2016) 6. Diabetes type 2 - now on insulin 7. Hypertension 8. Dyslipidemia 9. Peripheral neuropathy - secondary to diabetes 10. Progressive memory loss 11. Low back pain and leg weakness  12. CKD 3  PLAN: 1.   [name] has had some reported progressive dyspnea on exertion which seems to be exercise related.  This could be her ischemic equivalent.  We have not reassessed her coronaries now in a couple of years.  Vice is working properly.  She had an echo in July which showed normal LV function and grade 2 diastolic dysfunction.  TEE performed for bacteremia was negative for vegetation.  We will plan a Myoview stress test and lab work given the fact she had recent acute blood loss anemia requiring transfusion to rule out this is a possible cause of her dyspnea.  Plan follow-up with me afterwards.  [name], MD, [facility], [place] Director of the [facility]  [facility] Attending Cardiologist  Direct Dial: [phone]  Fax: [phone]  Website:  [email or web address] 01/05/2020, 8:28 PM

## 2020-01-07 ENCOUNTER — Other Ambulatory Visit: Payer: Self-pay | Admitting: *Deleted

## 2020-01-07 MED ORDER — TORSEMIDE 20 MG PO TABS: 40.0000 mg | ORAL_TABLET | Freq: Every day | ORAL | 5 refills | Status: DC

## 2020-01-09 ENCOUNTER — Telehealth (HOSPITAL_COMMUNITY): Payer: Self-pay | Admitting: *Deleted

## 2020-01-09 NOTE — Telephone Encounter (Signed)
Patient given detailed instructions per Myocardial Perfusion Study Information Sheet for the test on 01/14/20 at 10:30. Patient notified to arrive 15 minutes early and that it is imperative to arrive on time for appointment to keep from having the test rescheduled.  If you need to cancel or reschedule your appointment, please call the office within 24 hours of your appointment. . Patient verbalized understanding.[name]

## 2020-01-14 ENCOUNTER — Ambulatory Visit (HOSPITAL_COMMUNITY): Payer: Medicare Other | Attending: Cardiology

## 2020-01-14 ENCOUNTER — Other Ambulatory Visit: Payer: Self-pay

## 2020-01-14 VITALS — Ht 60.0 in | Wt 135.0 lb

## 2020-01-14 DIAGNOSIS — R0602 Shortness of breath: Secondary | ICD-10-CM

## 2020-01-14 DIAGNOSIS — I25709 Atherosclerosis of coronary artery bypass graft(s), unspecified, with unspecified angina pectoris: Secondary | ICD-10-CM | POA: Diagnosis not present

## 2020-01-14 DIAGNOSIS — R0789 Other chest pain: Secondary | ICD-10-CM

## 2020-01-14 DIAGNOSIS — Z951 Presence of aortocoronary bypass graft: Secondary | ICD-10-CM | POA: Diagnosis present

## 2020-01-14 DIAGNOSIS — I480 Paroxysmal atrial fibrillation: Secondary | ICD-10-CM | POA: Insufficient documentation

## 2020-01-14 LAB — MYOCARDIAL PERFUSION IMAGING
LV dias vol: 93 mL (ref 46–106)
LV sys vol: 49 mL
Peak HR: 63 {beats}/min
Rest HR: 61 {beats}/min
SDS: 6
SRS: 9
SSS: 15
TID: 1.03

## 2020-01-14 MED ORDER — TECHNETIUM TC 99M TETROFOSMIN IV KIT
30.5000 | PACK | Freq: Once | INTRAVENOUS | Status: AC | PRN
  Administered 2020-01-14: 30.5 via INTRAVENOUS
  Filled 2020-01-14: qty 31

## 2020-01-14 MED ORDER — TECHNETIUM TC 99M TETROFOSMIN IV KIT
10.1000 | PACK | Freq: Once | INTRAVENOUS | Status: AC | PRN
  Administered 2020-01-14: 10.1 via INTRAVENOUS
  Filled 2020-01-14: qty 11

## 2020-01-14 MED ORDER — REGADENOSON 0.4 MG/5ML IV SOLN
0.4000 mg | Freq: Once | INTRAVENOUS | Status: AC
  Administered 2020-01-14: 0.4 mg via INTRAVENOUS

## 2020-02-11 ENCOUNTER — Ambulatory Visit: Payer: Medicare Other | Admitting: Internal Medicine

## 2020-02-11 ENCOUNTER — Encounter: Payer: Self-pay | Admitting: Internal Medicine

## 2020-02-11 ENCOUNTER — Other Ambulatory Visit: Payer: Self-pay

## 2020-02-11 VITALS — BP 130/84 | HR 60 | Temp 97.1°F | Ht 61.5 in | Wt 135.6 lb

## 2020-02-11 DIAGNOSIS — R0602 Shortness of breath: Secondary | ICD-10-CM

## 2020-02-11 DIAGNOSIS — R0789 Other chest pain: Secondary | ICD-10-CM

## 2020-02-11 DIAGNOSIS — N184 Chronic kidney disease, stage 4 (severe): Secondary | ICD-10-CM | POA: Diagnosis not present

## 2020-02-11 DIAGNOSIS — Z950 Presence of cardiac pacemaker: Secondary | ICD-10-CM

## 2020-02-11 MED ORDER — NITROGLYCERIN 0.4 MG SL SUBL: 0.4000 mg | SUBLINGUAL_TABLET | SUBLINGUAL | 3 refills | Status: AC | PRN

## 2020-02-11 NOTE — Progress Notes (Signed)
OFFICE NOTE  Chief Complaint:  Follow-up Myoview  Primary Care Physician: Leeroy Cha, MD  HPI:  [name] is a pleasant 79 year old female with history of coronary disease and CABG x 3 vessels in 2013, LIMA to LAD, SVG to OM and SVG to right coronary. She underwent an echocardiogram which demonstrates normal EF of 55% to 65%. However, there is stage 1 diastolic dysfunction and evidence for elevated mean left atrial filling pressure. There were mildly thickened calcified aortic valve leaflets but no stenosis and mild tricuspid regurgitation. At the time, I started her on Lasix 20 mg daily. A BMP was obtained that day which indicated mild elevation at 75.5, creatinine was 1.16. She has been taking the Lasix with improvement in her lower extremity swelling although shortness of breath seems to be about the same. Again, she notes it is only with marked exertion, not necessarily with normal activities. However, she does have ongoing fatigue and easy fatigability. This may be due to just poor exercise tolerance. She also reported that recently you started her on a new SLGT2 diabetic medicine and she said that she took several doses of that, which I believe was Invokana, and she became markedly dizzy with that. She has since stopped that medication. Again, overall the patient is fairly stable with a normal ejection fraction. She recently had bypass and is not having any anginal symptoms and therefore I do not suspect that this is an issue related to her bypass grafts.   I saw [name] today in the office for an acute visit regarding what is described as abdominal pain. She reports a numbness and pain sensation over the right upper quadrant. She's also noted some firmness and distention of her abdomen. This is recently presented to a GI doctor in [place] who thought she might have a gastric ulcer. He performed endoscopy which was a reportedly unrevealing. He also recommended she is take  MiraLAX for constipation. She says that recently she's been constipated but also has been having some small amount of loose stool. She did not want to take the MiraLAX given the loose stool. She also reports some of the discomfort extends across the mid epigastrium and into the lower chest. It is entirely atypical for cardiac chest pain. She reports that she had a colonoscopy in the past by Dr. Henrene Pastor about 5 years ago and a repeat colonoscopy was recommended around 2018. She also tells me that she recently had a right upper quadrant ultrasound performed at [facility] after presenting with abdominal pain which was negative for acute biliary disease.  [name] returns today for follow-up. Overall she seems to be doing well from a cardiac standpoint. I understand she is switched primary care providers to [facility] primary care, however she is unclear of her primary care provider. She does have an upcoming appointment to see Jackelyn Knife neurology. Recently she's been having trouble with forgetfulness and it seems to be getting worse. She tells me she lost her cell phone at home and has not been able to locate it. She denies any chest pain or shortness of breath and in fact her flank back and abdominal pain which was evaluated extensively has now gone away.  I had the pleasure of seeing [name] back today in follow-up. It's been 6 months and she'll last appointment. Her main concern today is leg weakness. She reports she has some back pain and associated weakness in her legs that give out from time  to time. She recently had memory testing indicating possible dementia as well as likely depression. It was recommended that she go on therapy however she declined medications. She denies any chest pain or worsening shortness of breath.  07/01/2016  [name] returns today for follow-up. She was recently hospitalized for acute diastolic heart failure. She was treated with diuretics and  adjustments are made to her blood pressure medications. She had a repeat echocardiogram which was essentially stable showed normal LV EF and diastolic dysfunction. After discharge she underwent nuclear stress testing which was negative for ischemia. She saw Ignacia Bayley, NP, who made some changes to her blood pressure medications including placing her on clonidine. Since taking this medicine she has had some of the typical symptoms of fatigue and dry mouth as well as constipation. She was alert initially taking clonidine 2 mg twice daily. She then decreased it to one half tablet or 1 mg twice daily but blood pressure remains high. I checked her blood pressure manually today 160/60. She reports fatigue and low energy.  09/22/2016  [name] returns today for hospital follow-up. 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 She had a St. Jude surety MRI safe pacemaker placed. She was also started on amiodarone and underwent cardioversion. She seems to be maintaining sinus rhythm. She reports improvement in her energy. She recently saw Ignacia Bayley, NP, in follow-up who reported she was doing well although noted her blood pressure was more elevated. She was placed on doxazosin 4 mg daily and had initial improvement in blood pressure however blood pressure has since gone up. Today was 186/62. Unfortunately, she's been intolerant of a number of blood pressure medications in the past.  12/23/2016  [name] was seen in follow-up today. She saw Dr. candidates in January who noted that she reported being shaky on amiodarone and he decreased the dose down to 100 mg daily. She remains in an AV paced rhythm but there has been recurrent underlying atrial flutter. She is also had some fatigue. The pressure is elevated today 156/70. I reviewed home monitoring indicates blood pressures continue to be elevated. She seems to be okay with these numbers but she  is at increased risk for events. Unfortunate she's had intolerance to number of medications.  04/22/2017  [name] returns today for follow-up. She recently saw Almyra Deforest, PA-C, who noted that she had had some weight gain and lower extremity edema. He discontinued her chlorthalidone and put her on low-dose Lasix. She's had improvement in her swallowing with this and some weight loss. She then followed up with Dr. Moshe Cipro at [facility]. Her creatinine was noted to be 2.7 and she is undergoing a renal ultrasound and further workup for this. She was also noted to be anemic with a low iron saturation. She's been receiving some IV iron. She seems to be maintaining sinus rhythm on low-dose amiodarone. Recently she saw her endocrinologist who had increased her Synthroid. Her last TSH was between 7 and 8. She is due for repeat check of that. She has done well with the placement of a pacemaker in the fall. She has remote checks to follow that.  08/25/2017  [name] was seen today in follow-up.  Recently she had about 12 pound weight gain and saw her nephrologist.  She was started back on Lasix and was noted to have an elevated creatinine now back up to 2.14.  In addition her blood pressure was running low, specifically  her diastolic blood pressure and her amlodipine was decreased to 5 mg daily.  She seems to be maintaining sinus rhythm and is on low-dose amiodarone.  She is in a paced rhythm today and recent remote check showed no real evidence of atrial fibrillation.  She was on Xarelto however stopped that on her own recently because of fatigue and weakness.  She was also noted to be anemic and has been started on iron.  This begs a question of whether she may be having microscopic bleeding or whether her anemia was related to chronic kidney disease.  Nevertheless, she has an appropriately elevated chads vascular score and should be anticoagulated on Xarelto.  10/26/2017  [name] returns today  for follow-up.  She again is gained about 5 or 6 pounds.  Apparently she had seen her nephrologist and was started back on Lasix however her creatinine is rising.  I repeated labs on October 10, 2017 which showed a creatinine had increased up to 2.24, previously 1.9 and 2.45.  Her pro-BNP was elevated at 1437.  Since that time she has had some worsening shortness of breath, lower extremity swelling and increasing abdominal girth.  Blood pressure is quite elevated today 190/70, however she says is been lower at home.  06/12/2018  [name] is again seen today in follow-up.  She has had some increased abdominal girth but no significant swelling or edema.  Remote pacemaker checks have been performed which showed normal battery life and adequate pacer function.  She is noted today to be in an atrial paced rhythm with prolonged AV conduction at 60.  She denies any chest pain or worsening shortness of breath. She was recently seen by Dr. Moshe Cipro at [facility].  It is felt that her creatinine has been somewhat stable.  11/27/2018  [name] seen today for follow-up of left heart catheterization.  This occurred in December after having symptoms of progressive shortness of breath and chest tightness.  She did have a stress test in November 2019 which showed a new area of ischemia in the OM territory.  She underwent left heart catheterization which showed high-grade graft disease with 95% ostial stenosis in the graft to the marginal vessel which likely accounted for the nuclear study changes.  However, given her chronic kidney disease stage PCI was recommended.  She went back for PCI in December 2019 which was successful.  Since then she is done fairly well.  Immediately had some improvement in shortness of breath and discomfort however has had some recurrent shortness of breath.  She has had some improvement in dyslipidemia now after restarting rosuvastatin 5 mg daily however LDL remains elevated at 114.   She previously could not tolerate higher doses of statins however I am not sure as she has been on high potency rosuvastatin.  She also remains on Plavix and Xarelto which I think may be a lifelong combination for her.  Also, her blood pressure has been running little higher after stopping her amlodipine recently because of lower blood pressure.  08/15/2019  [name] is seen today in follow-up with her daughter.  Overall she continues to do well without recurrent chest pain after PCI last year.  She is on Plavix and Xarelto and denies any significant bleeding issues on that.  She also takes daily torsemide.  EKG shows an atrial paced rhythm today.  He recently has had some hearing difficulties but that is being addressed.  Blood pressure is at times labile however has been  reasonably well controlled.  Although the systolics a little higher she does have a wide pulse pressure and I would be hesitant to be real aggressive about her treatment as to not lower her diastolics any further.  Today her diastolic pressure was 48.  01/04/2020  [name] returns today for follow-up.  She is again accompanied by her daughter.  After some discussion she reports she has had some progressive dyspnea on exertion.  This is probably been over the past several months however to her daughter is much more noticeable.  She gets some pressure in her chest but no pain.  This is associated with exertion and relieved at rest.  02/11/2020  [name] returns today for follow-up of her Myoview stress test.  She is again accompanied by her daughter.  The Myoview stress test indicated a moderate sized partially reversible perfusion defect of the apical inferior, apical lateral and apex areas.  It was thought that this might be attenuation artifact.  LVEF was 47%.  In comparison with the prior study, this appears to be fairly stable if not slightly improved.  To my eye, there is less ischemia.  She now reports that her symptoms  have improved somewhat as well.  PMHx:  Past Medical History:  Diagnosis Date  . Anemia   . Anxiety   . Arthritis    "in my hands; knees, back" (09/27/2018)  . CAD (coronary artery disease)    a. 40-59% bilaterally 10/2015.  Marland Kitchen Chronic diastolic CHF (congestive heart failure) ([place])    a. 05/2016 Echo: EF 60-65%, no rwma, Gr1 DD, Ao sclerosis w/o stenosis, triv MR;  b. 07/2016 TEE: EF 55-60%, no rwma, mild MR.  . CKD (chronic kidney disease), stage III   . Coronary artery disease    a. 02/2007 Persantine MV: low risk;  b. 11/2011 CABG x 3 (LIMA->LAD, VG->OM, VG->RCA);  c. 05/2016 MV: EF >65%, no isch/infarct, horiz ST dep in I, II, V5-V6.  Marland Kitchen Depression   . Diverticulosis   . Esophageal stricture   . GERD (gastroesophageal reflux disease)   . Hemorrhoids   . Hiatal hernia   . Hyperkalemia    a. ARB stopped due to this.  . Hyperlipidemia   . Hypertension   . Hypertensive heart disease   . Hypothyroidism   . Major depressive disorder with anxious distress 09/05/2019  . Mild cognitive impairment    a. seen by neurology.  . Mild vascular neurocognitive disorder ([place]) 09/05/2019  . Myocardial infarction ([place]) 12/07/2011  . PAF (paroxysmal atrial fibrillation) ([facility])    a. post-op CABG.  . Pain    RIGHT KNEE PAIN - TORN RIGHT MEDIAL MENISCUS  . Paroxysmal atrial flutter ([place])    a. 07/2016 s/p TEE & DCCV;  b. 07/2016 Recurrent PAFlutter req initiation of amio & PPM in setting of tachy-brady;  c. CHA2DS2VASc = 7-->Xarelto 15 mg QD.  Marland Kitchen Pneumonia    "twice" (09/27/2018)  . PONV (postoperative nausea and vomiting)   . Presence of permanent cardiac pacemaker 08/12/2016  . S/P CABG (coronary artery bypass graft), 12/04/11 12/07/2011   LIMA to LAD, SVG to OM, SVG to RCA  . Sinus bradycardia    a. not on BB due to this.  . Skin cancer    "face" (09/27/2018)  . Tachy-brady syndrome ([place])    a. 07/2016 Jxnl brady following DCCV, recurrent Aflutter-->amio + SJM 2272 Assurity MRI DC PPM (ser #  [id number]).  . Type II diabetes mellitus ([place])  Past Surgical History:  Procedure Laterality Date  . ABDOMINAL HYSTERECTOMY  1980's  . ANKLE FRACTURE SURGERY Right    "put pins both side right ankle"  . BACK SURGERY  2006   "cyst growing near my spine"  . CARDIAC CATHETERIZATION  12/02/2011   mild LV dysfunction with mod hypocontractility of mid-distal anterolateral wall; CAD w/ostial tapering of L [place] with 50% diffuse ostial narrowing of LAD, 99% eccentric focal prox LAD stenosis followed by 70% prox LAD stenosis after 1st diag, 20% mid LAD narrowing; 80% ostial-to-prox L Cfx stenosis & 40-50% irregularity of RCA (Dr. Corky Downs)  . CARDIOVERSION N/A 08/11/2016   Procedure: CARDIOVERSION;  Surgeon: Lelon Perla, MD;  Location: [facility] ENDOSCOPY;  Service: Cardiovascular;  Laterality: N/A;  . CATARACT EXTRACTION W/ INTRAOCULAR LENS  IMPLANT, BILATERAL Bilateral ~ 2010  . [place]  . CORONARY ARTERY BYPASS GRAFT  12/04/2011   Procedure: CORONARY ARTERY BYPASS GRAFTING (CABG);  Surgeon: Tharon Aquas Adelene Idler, MD;  Location: [place];  Service: Open Heart Surgery;  Laterality: N/A;  CABG x three,  using left internal mammary artery, and right leg greater saphenous vein harvested endoscopically  . CORONARY STENT INTERVENTION N/A 09/27/2018   Procedure: CORONARY STENT INTERVENTION;  Surgeon: Troy Sine, MD;  Location: [place] CV LAB;  Service: Cardiovascular;  Laterality: N/A;  . DILATION AND CURETTAGE OF UTERUS     "a couple times"  . EP IMPLANTABLE DEVICE N/A 08/12/2016   Procedure: Pacemaker Implant;  Surgeon: Will Meredith Leeds, MD;  Location: [place] CV LAB;  Service: Cardiovascular;  Laterality: N/A;  . ESOPHAGOGASTRODUODENOSCOPY (EGD) WITH ESOPHAGEAL DILATION    . FRACTURE SURGERY    . JOINT REPLACEMENT    . KNEE ARTHROSCOPY WITH MEDIAL MENISECTOMY Right 07/02/2014   Procedure: RIGHT KNEE ARTHROSCOPY WITH PARTIAL MEDIAL MENISTECTOMY, ABRASION CONDROPLASTYU OF  PATELLA,ABRASION CONDROPLASTY OF MEDIAL FEMEROL CONDYL, MICROFRACTURE OF MEDIAL FEMEROL CONDYL;  Surgeon: Tobi Bastos, MD;  Location: [facility] ORS;  Service: Orthopedics;  Laterality: Right;  . LEFT HEART CATH AND CORS/GRAFTS ANGIOGRAPHY N/A 09/06/2018   Procedure: LEFT HEART CATH AND CORS/GRAFTS ANGIOGRAPHY;  Surgeon: Troy Sine, MD;  Location: [place] CV LAB;  Service: Cardiovascular;  Laterality: N/A;  . LEFT HEART CATHETERIZATION WITH CORONARY ANGIOGRAM N/A 12/02/2011   Procedure: LEFT HEART CATHETERIZATION WITH CORONARY ANGIOGRAM;  Surgeon: Troy Sine, MD;  Location: [facility] CATH LAB;  Service: Cardiovascular;  Laterality: N/A;  Coronary angiogram, possible PCI  . TEE WITHOUT CARDIOVERSION N/A 08/11/2016   Procedure: TRANSESOPHAGEAL ECHOCARDIOGRAM (TEE);  Surgeon: Lelon Perla, MD;  Location: [facility] ENDOSCOPY;  Service: Cardiovascular;  Laterality: N/A;  . TEE WITHOUT CARDIOVERSION N/A 05/18/2019   Procedure: TRANSESOPHAGEAL ECHOCARDIOGRAM (TEE);  Surgeon: [name], MD;  Location: [place];  Service: Cardiovascular;  Laterality: N/A;  . TONSILLECTOMY  1949  . TOTAL KNEE ARTHROPLASTY Left ~ 2006  . TRANSTHORACIC ECHOCARDIOGRAM  02/19/2013   EF 56-38%, grade 1 diastolic dysfunction; mildly thickend/calcified AV leaflets; mildly calcidied MV annulus; mild TR    FAMHx:  Family History  Problem Relation Age of Onset  . Diabetes Mother   . CVA Mother   . Hypertension Mother   . Heart disease Father   . Hyperlipidemia Father   . Breast cancer Sister        x 3  . Heart disease Brother        x5; one with MI  . Heart disease Sister  x3  . Diabetes Sister        x3  . Lung cancer Sister   . Breast cancer Sister        x2  . Colon cancer Neg Hx     SOCHx:   reports that she has never smoked. She has never used smokeless tobacco. She reports previous alcohol use. She reports that she does not use drugs.  ALLERGIES:  Allergies  Allergen Reactions  . Clonidine  Derivatives Other (See Comments)    Bradycardia and fatigue   . Sulfa Antibiotics Other (See Comments)    Unknown  . Crestor [Rosuvastatin Calcium] Other (See Comments)    Other reaction(s): tired and weak  . Epinephrine Other (See Comments)    Abnormal feeling. Dental exam/injection of local w/ epi.  Marland Kitchen Hydralazine Other (See Comments)    Nausea/gi upset   . Losartan Other (See Comments)    Hyperkalemia   . Other Other (See Comments)    MANGO'S - WELTS ALL OVER    ROS: Pertinent items noted in HPI and remainder of comprehensive ROS otherwise negative.  HOME MEDS: Current Outpatient Medications  Medication Sig Dispense Refill  . acetaminophen (TYLENOL) 650 MG CR tablet Take 650 mg by mouth every 8 (eight) hours as needed for pain.    . Ascorbic Acid (VITAMIN C PO) Take 1 tablet by mouth 3 (three) times a week.    . carvedilol (COREG) 25 MG tablet Take 1 tablet (25 mg total) by mouth 2 (two) times daily. 60 tablet 11  . Cyanocobalamin (VITAMIN B-12 PO) Take 1 tablet by mouth 3 (three) times a week.     . docusate sodium (COLACE) 100 MG capsule Take 100 mg by mouth 2 (two) times daily as needed for mild constipation or moderate constipation.    Marland Kitchen esomeprazole (NEXIUM) 20 MG capsule TAKE 1 CAPSULE BY MOUTH ONCE DAILY AT 12 NOON    . ferrous sulfate 325 (65 FE) MG EC tablet Take 325 mg by mouth daily with breakfast.    . gabapentin (NEURONTIN) 300 MG capsule Take 1 capsule (300 mg total) by mouth at bedtime. 30 capsule 2  . LANTUS SOLOSTAR 100 UNIT/ML Solostar Pen Inject 22 Units into the skin daily.  6  . levothyroxine (SYNTHROID) 75 MCG tablet Take 75 mcg by mouth every morning.    Marland Kitchen levothyroxine (SYNTHROID) 88 MCG tablet Take 88 mcg by mouth daily before breakfast.     . meclizine (ANTIVERT) 25 MG tablet Take 25 mg by mouth daily as needed for dizziness. For dizziness    . Multiple Vitamins-Minerals (PRESERVISION AREDS 2 PO) Take 1 tablet by mouth every evening.     Vladimir Faster  Glycol-Propyl Glycol (SYSTANE OP) Place 1 drop into both eyes 2 (two) times daily.     . Rosuvastatin Calcium 10 MG CPSP Take 10 mg by mouth daily.     Marland Kitchen torsemide (DEMADEX) 20 MG tablet Take 2 tablets (40 mg total) by mouth daily. 60 tablet 5   No current facility-administered medications for this visit.    LABS/IMAGING: No results found for this or any previous visit (from the past 48 hour(s)). No results found.  VITALS: BP 130/84   Pulse 60   Temp (!) 97.1 F (36.2 C)   Ht 5' 1.5" (1.562 m)   Wt 135 lb 9.6 oz (61.5 kg)   SpO2 95%   BMI 25.21 kg/m   EXAM: Deferred  EKG: Deferred  ASSESSMENT: 1. Progressive DOE-low risk Myoview  stress test (12/2019)-LVEF 47% 2. Unstable angina with recent PCI to the SVG to marginal graft (2019) 3. Coronary artery disease status post CABG x 3 (LIMA to LAD, SVG to Diag, SVG to RCA) in 2013 4. Paroxysmal atrial flutter-CHADSVASC score of 5 on Xarelto 5. Tachybradycardia syndrome status post St. Jude permanent pacemaker (07/2016) 6. Diabetes type 2 - now on insulin 7. Hypertension 8. Dyslipidemia 9. Peripheral neuropathy - secondary to diabetes 10. Progressive memory loss 11. Low back pain and leg weakness  12. CKD 3  PLAN: 1.   Josilynn had a low risk Myoview stress test and reports some improvement in her dyspnea.  I suspect this is multifactorial and does seem to be worse when bending over or doing certain activities.  She does have at least moderate chronic kidney disease as well.  Based on that I think there is not enough evidence to recommend a heart catheterization and the risk benefit ratio is not favorable.  Although there was a minimal amount of ischemia, the study was comparatively better in my opinion with her prior study.  We will provide some sublingual nitro for implant otherwise follow-up in 6 months or sooner as necessary.  [name], MD, [facility], [place] Director of the Advanced  Lipid Disorders &  Cardiovascular Risk Reduction Clinic Attending Cardiologist  Direct Dial: [phone]  Fax: [phone]  Website:  [email or web address] 02/11/2020, 1:47 PM

## 2020-02-11 NOTE — Patient Instructions (Signed)
Medication Instructions:  Dr. Debara Pickett has prescribed NTG (nitroglycerin) to use as needed for chest pain   *If you need a refill on your cardiac medications before your next appointment, please call your pharmacy*   Lab Work: NONE If you have labs (blood work) drawn today and your tests are completely normal, you will receive your results only by: Marland Kitchen MyChart Message (if you have MyChart) OR . A paper copy in the mail If you have any lab test that is abnormal or we need to change your treatment, we will call you to review the results.   Testing/Procedures: NONE   Follow-Up: At [facility], you and your health needs are our priority.  As part of our continuing mission to provide you with exceptional heart care, we have created designated Provider Care Teams.  These Care Teams include your primary Cardiologist (physician) and Advanced Practice Providers (APPs -  Physician Assistants and Nurse Practitioners) who all work together to provide you with the care you need, when you need it.  We recommend signing up for the patient portal called "MyChart".  Sign up information is provided on this After Visit Summary.  MyChart is used to connect with patients for Virtual Visits (Telemedicine).  Patients are able to view lab/test results, encounter notes, upcoming appointments, etc.  Non-urgent messages can be sent to your provider as well.   To learn more about what you can do with MyChart, go to [email or web address].    Your next appointment:   6 month(s)  The format for your next appointment:   In Person  Provider:   You may see [name], MD or one of the following Advanced Practice Providers on your designated Care Team:    Almyra Deforest, PA-C  Fabian Sharp, PA-C or   Roby Lofts, [place]    Other Instructions  You are due for a routine visit with Dr. Curt Bears

## 2020-02-12 ENCOUNTER — Encounter: Payer: Self-pay | Admitting: Internal Medicine

## 2020-02-19 ENCOUNTER — Ambulatory Visit (INDEPENDENT_AMBULATORY_CARE_PROVIDER_SITE_OTHER): Payer: Medicare Other | Admitting: *Deleted

## 2020-02-19 DIAGNOSIS — I495 Sick sinus syndrome: Secondary | ICD-10-CM

## 2020-02-19 LAB — CUP PACEART REMOTE DEVICE CHECK
Battery Remaining Longevity: 120 mo
Battery Remaining Percentage: 95.5 %
Battery Voltage: 2.99 V
Brady Statistic AP VP Percent: 1 %
Brady Statistic AP VS Percent: 98 %
Brady Statistic AS VP Percent: 1 %
Brady Statistic AS VS Percent: 1 %
Brady Statistic RA Percent Paced: 98 %
Brady Statistic RV Percent Paced: 1 %
Date Time Interrogation Session: 20210427030323
Implantable Lead Implant Date: 20171019
Implantable Lead Implant Date: 20171019
Implantable Lead Location: 753859
Implantable Lead Location: 753860
Implantable Pulse Generator Implant Date: 20171019
Lead Channel Impedance Value: 400 Ohm
Lead Channel Impedance Value: 780 Ohm
Lead Channel Pacing Threshold Amplitude: 0.5 V
Lead Channel Pacing Threshold Amplitude: 0.75 V
Lead Channel Pacing Threshold Pulse Width: 0.4 ms
Lead Channel Pacing Threshold Pulse Width: 0.4 ms
Lead Channel Sensing Intrinsic Amplitude: 12 mV
Lead Channel Sensing Intrinsic Amplitude: 2 mV
Lead Channel Setting Pacing Amplitude: 2 V
Lead Channel Setting Pacing Amplitude: 2.5 V
Lead Channel Setting Pacing Pulse Width: 0.4 ms
Lead Channel Setting Sensing Sensitivity: 2 mV
Pulse Gen Model: 2272
Pulse Gen Serial Number: 3180458

## 2020-02-19 NOTE — Progress Notes (Signed)
PPM Remote  

## 2020-04-03 ENCOUNTER — Encounter: Payer: Self-pay | Admitting: Cardiology

## 2020-04-03 ENCOUNTER — Ambulatory Visit: Payer: Medicare Other | Admitting: Cardiology

## 2020-04-03 ENCOUNTER — Other Ambulatory Visit: Payer: Self-pay

## 2020-04-03 VITALS — BP 142/82 | HR 69 | Ht 60.0 in | Wt 136.6 lb

## 2020-04-03 DIAGNOSIS — I495 Sick sinus syndrome: Secondary | ICD-10-CM | POA: Diagnosis not present

## 2020-04-03 LAB — CUP PACEART INCLINIC DEVICE CHECK
Battery Remaining Longevity: 123 mo
Battery Voltage: 2.99 V
Brady Statistic RA Percent Paced: 98 %
Brady Statistic RV Percent Paced: 0.29 %
Date Time Interrogation Session: 20210610143300
Implantable Lead Implant Date: 20171019
Implantable Lead Implant Date: 20171019
Implantable Lead Location: 753859
Implantable Lead Location: 753860
Implantable Pulse Generator Implant Date: 20171019
Lead Channel Impedance Value: 425 Ohm
Lead Channel Impedance Value: 800 Ohm
Lead Channel Pacing Threshold Amplitude: 0.5 V
Lead Channel Pacing Threshold Amplitude: 0.75 V
Lead Channel Pacing Threshold Pulse Width: 0.4 ms
Lead Channel Pacing Threshold Pulse Width: 0.4 ms
Lead Channel Sensing Intrinsic Amplitude: 12 mV
Lead Channel Setting Pacing Amplitude: 2 V
Lead Channel Setting Pacing Amplitude: 2.5 V
Lead Channel Setting Pacing Pulse Width: 0.4 ms
Lead Channel Setting Sensing Sensitivity: 2 mV
Pulse Gen Model: 2272
Pulse Gen Serial Number: 3180458

## 2020-04-03 NOTE — Progress Notes (Signed)
Electrophysiology Office Note   Date:  04/03/2020   ID:  [name], DOB 18-Aug-1941, MRN [id number]  PCP:  Leeroy Cha, MD  Cardiologist:  Debara Pickett Primary Electrophysiologist:  Geo Slone Meredith Leeds, MD    No chief complaint on file.    History of Present Illness: [name] is a 79 y.o. female who presents today for electrophysiology evaluation.   She has a history of atypical atrial flutter, sinus bradycardia, hypertension, and coronary artery disease status post CABG. She had a TEE and cardioversion in October for atypical atrial flutter. She was having exercise intolerance and fatigue prior to her cardioversion. Had dual chamber pacemaker placed 08/12/16.   Today, denies symptoms of palpitations, chest pain, shortness of breath, orthopnea, PND, lower extremity edema, claudication, dizziness, presyncope, syncope, bleeding, or neurologic sequela. The patient is tolerating medications without difficulties.  She is currently feeling well.  She has no major complaints at this time.   Past Medical History:  Diagnosis Date  . Anemia   . Anxiety   . Arthritis    "in my hands; knees, back" (09/27/2018)  . CAD (coronary artery disease)    a. 40-59% bilaterally 10/2015.  Marland Kitchen Chronic diastolic CHF (congestive heart failure) ([place])    a. 05/2016 Echo: EF 60-65%, no rwma, Gr1 DD, Ao sclerosis w/o stenosis, triv MR;  b. 07/2016 TEE: EF 55-60%, no rwma, mild MR.  . CKD (chronic kidney disease), stage III   . Coronary artery disease    a. 02/2007 Persantine MV: low risk;  b. 11/2011 CABG x 3 (LIMA->LAD, VG->OM, VG->RCA);  c. 05/2016 MV: EF >65%, no isch/infarct, horiz ST dep in I, II, V5-V6.  Marland Kitchen Depression   . Diverticulosis   . Esophageal stricture   . GERD (gastroesophageal reflux disease)   . Hemorrhoids   . Hiatal hernia   . Hyperkalemia    a. ARB stopped due to this.  . Hyperlipidemia   . Hypertension   . Hypertensive heart disease   . Hypothyroidism   . Major depressive  disorder with anxious distress 09/05/2019  . Mild cognitive impairment    a. seen by neurology.  . Mild vascular neurocognitive disorder ([place]) 09/05/2019  . Myocardial infarction ([place]) 12/07/2011  . PAF (paroxysmal atrial fibrillation) ([facility])    a. post-op CABG.  . Pain    RIGHT KNEE PAIN - TORN RIGHT MEDIAL MENISCUS  . Paroxysmal atrial flutter ([place])    a. 07/2016 s/p TEE & DCCV;  b. 07/2016 Recurrent PAFlutter req initiation of amio & PPM in setting of tachy-brady;  c. CHA2DS2VASc = 7-->Xarelto 15 mg QD.  Marland Kitchen Pneumonia    "twice" (09/27/2018)  . PONV (postoperative nausea and vomiting)   . Presence of permanent cardiac pacemaker 08/12/2016  . S/P CABG (coronary artery bypass graft), 12/04/11 12/07/2011   LIMA to LAD, SVG to OM, SVG to RCA  . Sinus bradycardia    a. not on BB due to this.  . Skin cancer    "face" (09/27/2018)  . Tachy-brady syndrome ([place])    a. 07/2016 Jxnl brady following DCCV, recurrent Aflutter-->amio + SJM 2272 Assurity MRI DC PPM (ser # [id number]).  . Type II diabetes mellitus ([place])    Past Surgical History:  Procedure Laterality Date  . ABDOMINAL HYSTERECTOMY  1980's  . ANKLE FRACTURE SURGERY Right    "put pins both side right ankle"  . BACK SURGERY  2006   "cyst growing near my spine"  . CARDIAC CATHETERIZATION  12/02/2011  mild LV dysfunction with mod hypocontractility of mid-distal anterolateral wall; CAD w/ostial tapering of L [place] with 50% diffuse ostial narrowing of LAD, 99% eccentric focal prox LAD stenosis followed by 70% prox LAD stenosis after 1st diag, 20% mid LAD narrowing; 80% ostial-to-prox L Cfx stenosis & 40-50% irregularity of RCA (Dr. Corky Downs)  . CARDIOVERSION N/A 08/11/2016   Procedure: CARDIOVERSION;  Surgeon: Lelon Perla, MD;  Location: [facility] ENDOSCOPY;  Service: Cardiovascular;  Laterality: N/A;  . CATARACT EXTRACTION W/ INTRAOCULAR LENS  IMPLANT, BILATERAL Bilateral ~ 2010  . [place]  . CORONARY ARTERY BYPASS GRAFT  12/04/2011    Procedure: CORONARY ARTERY BYPASS GRAFTING (CABG);  Surgeon: Tharon Aquas Adelene Idler, MD;  Location: [facility];  Service: Open Heart Surgery;  Laterality: N/A;  CABG x three,  using left internal mammary artery, and right leg greater saphenous vein harvested endoscopically  . CORONARY STENT INTERVENTION N/A 09/27/2018   Procedure: CORONARY STENT INTERVENTION;  Surgeon: Troy Sine, MD;  Location: [facility] CV LAB;  Service: Cardiovascular;  Laterality: N/A;  . DILATION AND CURETTAGE OF UTERUS     "a couple times"  . EP IMPLANTABLE DEVICE N/A 08/12/2016   Procedure: Pacemaker Implant;  Surgeon: Adarryl Goldammer Meredith Leeds, MD;  Location: [facility] CV LAB;  Service: Cardiovascular;  Laterality: N/A;  . ESOPHAGOGASTRODUODENOSCOPY (EGD) WITH ESOPHAGEAL DILATION    . FRACTURE SURGERY    . JOINT REPLACEMENT    . KNEE ARTHROSCOPY WITH MEDIAL MENISECTOMY Right 07/02/2014   Procedure: RIGHT KNEE ARTHROSCOPY WITH PARTIAL MEDIAL MENISTECTOMY, ABRASION CONDROPLASTYU OF PATELLA,ABRASION CONDROPLASTY OF MEDIAL FEMEROL CONDYL, MICROFRACTURE OF MEDIAL FEMEROL CONDYL;  Surgeon: Tobi Bastos, MD;  Location: [facility] ORS;  Service: Orthopedics;  Laterality: Right;  . LEFT HEART CATH AND CORS/GRAFTS ANGIOGRAPHY N/A 09/06/2018   Procedure: LEFT HEART CATH AND CORS/GRAFTS ANGIOGRAPHY;  Surgeon: Troy Sine, MD;  Location: [facility] CV LAB;  Service: Cardiovascular;  Laterality: N/A;  . LEFT HEART CATHETERIZATION WITH CORONARY ANGIOGRAM N/A 12/02/2011   Procedure: LEFT HEART CATHETERIZATION WITH CORONARY ANGIOGRAM;  Surgeon: Troy Sine, MD;  Location: [facility] CATH LAB;  Service: Cardiovascular;  Laterality: N/A;  Coronary angiogram, possible PCI  . TEE WITHOUT CARDIOVERSION N/A 08/11/2016   Procedure: TRANSESOPHAGEAL ECHOCARDIOGRAM (TEE);  Surgeon: Lelon Perla, MD;  Location: [facility] ENDOSCOPY;  Service: Cardiovascular;  Laterality: N/A;  . TEE WITHOUT CARDIOVERSION N/A 05/18/2019   Procedure: TRANSESOPHAGEAL ECHOCARDIOGRAM  (TEE);  Surgeon: [name], MD;  Location: [facility];  Service: Cardiovascular;  Laterality: N/A;  . TONSILLECTOMY  1949  . TOTAL KNEE ARTHROPLASTY Left ~ 2006  . TRANSTHORACIC ECHOCARDIOGRAM  02/19/2013   EF 62-70%, grade 1 diastolic dysfunction; mildly thickend/calcified AV leaflets; mildly calcidied MV annulus; mild TR     Current Outpatient Medications  Medication Sig Dispense Refill  . acetaminophen (TYLENOL) 650 MG CR tablet Take 650 mg by mouth every 8 (eight) hours as needed for pain.    . Ascorbic Acid (VITAMIN C PO) Take 1 tablet by mouth 3 (three) times a week.    . carvedilol (COREG) 25 MG tablet Take 1 tablet (25 mg total) by mouth 2 (two) times daily. 60 tablet 11  . carvedilol (COREG) 25 MG tablet Take 25 mg by mouth 2 (two) times daily with a meal. Take 2 tablets in the AM and 1 in the PM    . Cyanocobalamin (VITAMIN B-12 PO) Take 1 tablet by mouth 3 (three) times a week.     Marland Kitchen  docusate sodium (COLACE) 100 MG capsule Take 100 mg by mouth 2 (two) times daily as needed for mild constipation or moderate constipation.    Marland Kitchen esomeprazole (NEXIUM) 20 MG capsule TAKE 1 CAPSULE BY MOUTH ONCE DAILY AT 12 NOON    . ferrous sulfate 325 (65 FE) MG EC tablet Take 325 mg by mouth daily with breakfast.    . gabapentin (NEURONTIN) 300 MG capsule Take 1 capsule (300 mg total) by mouth at bedtime. 30 capsule 2  . LANTUS SOLOSTAR 100 UNIT/ML Solostar Pen Inject 22 Units into the skin daily.  6  . levothyroxine (SYNTHROID) 75 MCG tablet Take 75 mcg by mouth every morning.    Marland Kitchen levothyroxine (SYNTHROID) 88 MCG tablet Take 88 mcg by mouth daily before breakfast.     . meclizine (ANTIVERT) 25 MG tablet Take 25 mg by mouth daily as needed for dizziness. For dizziness    . Multiple Vitamins-Minerals (PRESERVISION AREDS 2 PO) Take 1 tablet by mouth every evening.     . nitroGLYCERIN (NITROSTAT) 0.4 MG SL tablet Place 1 tablet (0.4 mg total) under the tongue every 5 (five) minutes as needed for  chest pain (max 3 doses). 25 tablet 3  . Polyethyl Glycol-Propyl Glycol (SYSTANE OP) Place 1 drop into both eyes 2 (two) times daily.     Marland Kitchen torsemide (DEMADEX) 20 MG tablet Take 2 tablets (40 mg total) by mouth daily. 60 tablet 5  . rosuvastatin (CRESTOR) 10 MG tablet Take 10 mg by mouth daily.     No current facility-administered medications for this visit.    Allergies:   Clonidine derivatives, Sulfa antibiotics, Crestor [rosuvastatin calcium], Epinephrine, Hydralazine, Losartan, and Other   Social History:  The patient  reports that she has never smoked. She has never used smokeless tobacco. She reports previous alcohol use. She reports that she does not use drugs.   Family History:  The patient's family history includes Breast cancer in her sister and sister; CVA in her mother; Diabetes in her mother and sister; Heart disease in her brother, father, and sister; Hyperlipidemia in her father; Hypertension in her mother; Lung cancer in her sister.   ROS:  Please see the history of present illness.   Otherwise, review of systems is positive for none.   All other systems are reviewed and negative.   PHYSICAL EXAM: VS:  BP (!) 142/82   Pulse 69   Ht 5' (1.524 m)   Wt 136 lb 9.6 oz (62 kg)   SpO2 94%   BMI 26.68 kg/m  , BMI Body mass index is 26.68 kg/m. GEN: Well nourished, well developed, in no acute distress  HEENT: normal  Neck: no JVD, carotid bruits, or masses Cardiac: RRR; no murmurs, rubs, or gallops,no edema  Respiratory:  clear to auscultation bilaterally, normal work of breathing GI: soft, nontender, nondistended, + BS MS: no deformity or atrophy  Skin: warm and dry, device site well healed Neuro:  Strength and sensation are intact Psych: euthymic mood, full affect  EKG:  EKG is not ordered today. Personal review of the ekg ordered 01/11/20 shows atrial paced, rate 64  Personal review of the device interrogation today. Results in [facility]: 05/18/2019:  Magnesium 2.5 01/04/2020: ALT 13; BNP 619.4; BUN 50; Creatinine, Ser 2.11; Hemoglobin 11.0; Platelets 183; Potassium 5.4; Sodium 142    Lipid Panel     Component Value Date/Time   CHOL 203 (H) 09/28/2018 0437   TRIG 320 (H) 09/28/2018 5102  HDL 25 (L) 09/28/2018 0437   CHOLHDL 8.1 09/28/2018 0437   VLDL 64 (H) 09/28/2018 0437   LDLCALC 114 (H) 09/28/2018 0437     Wt Readings from Last 3 Encounters:  04/03/20 136 lb 9.6 oz (62 kg)  02/11/20 135 lb 9.6 oz (61.5 kg)  01/14/20 135 lb (61.2 kg)      Other studies Reviewed: Additional studies/ records that were reviewed today include: TTE 05/25/16  Review of the above records today demonstrates:  - Left ventricle: The cavity size was normal. Wall thickness was   normal. Systolic function was normal. The estimated ejection   fraction was in the range of 60% to 65%. Wall motion was normal;   there were no regional wall motion abnormalities. Doppler   parameters are consistent with abnormal left ventricular   relaxation (grade 1 diastolic dysfunction). The E/e&' ratio is   between 8-15, suggesting indeterminate LV filling pressure. - Aortic valve: Trileaflet. Sclerosis without stenosis.   Transvalvular velocity was minimally increased. There was no   stenosis. There was no regurgitation. - Mitral valve: Mildly thickened leaflets . There was trivial   regurgitation. - Left atrium: The atrium was normal in size. - Tricuspid valve: There was no significant regurgitation. - Inferior vena cava: The vessel was normal in size. The   respirophasic diameter changes were in the normal range (>= 50%),   consistent with normal central venous pressure.   ASSESSMENT AND PLAN:  1. Atypical atrial flutter Cardioversion October 2018 with recurrent atrial flutter.  Currently on Xarelto and amiodarone.  CHA2DS2-VASc of 6.    2. Tachybradycardia syndrome Status post [employer] Jude dual-chamber pacemaker implanted 08/12/2016.  Device functioning  appropriately.  No changes.  3. HTN  Mildly elevated today but usually well controlled.  No changes.  4. CAD s/p CABG No current ischemic symptoms.  No changes.    Current medicines are reviewed at length with the patient today.   The patient does not have concerns regarding her medicines.  The following changes were made today: None  Labs/ tests ordered today include:  No orders of the defined types were placed in this encounter.    Disposition:   FU with Jaunita Mikels 12 months  Signed, Mikaiah Stoffer Meredith Leeds, MD  04/03/2020 2:52 PM     [facility] [address] [place] [address] [phone] (office) [phone] (fax)

## 2020-04-03 NOTE — Patient Instructions (Signed)
Medication Instructions:  Your physician recommends that you continue on your current medications as directed. Please refer to the Current Medication list given to you today.  *If you need a refill on your cardiac medications before your next appointment, please call your pharmacy*   Lab Work: None ordered If you have labs (blood work) drawn today and your tests are completely normal, you will receive your results only by: Marland Kitchen MyChart Message (if you have MyChart) OR . A paper copy in the mail If you have any lab test that is abnormal or we need to change your treatment, we will call you to review the results.   Testing/Procedures: None ordered   Follow-Up: Remote monitoring is used to monitor your Pacemaker of ICD from home. This monitoring reduces the number of office visits required to check your device to one time per year. It allows [place] to keep an eye on the functioning of your device to ensure it is working properly. You are scheduled for a device check from home on 05/20/20. You may send your transmission at any time that day. If you have a wireless device, the transmission will be sent automatically. After your physician reviews your transmission, you will receive a postcard with your next transmission date.  At [facility], you and your health needs are our priority.  As part of our continuing mission to provide you with exceptional heart care, we have created designated Provider Care Teams.  These Care Teams include your primary Cardiologist (physician) and Advanced Practice Providers (APPs -  Physician Assistants and Nurse Practitioners) who all work together to provide you with the care you need, when you need it.  We recommend signing up for the patient portal called "MyChart".  Sign up information is provided on this After Visit Summary.  MyChart is used to connect with patients for Virtual Visits (Telemedicine).  Patients are able to view lab/test results, encounter notes,  upcoming appointments, etc.  Non-urgent messages can be sent to your provider as well.   To learn more about what you can do with MyChart, go to [email or web address].    Your next appointment:   1 year(s)  The format for your next appointment:   In Person  Provider:   Allegra Lai, MD   Thank you for choosing [facility]!!   Trinidad Curet, RN [phone]    Other Instructions

## 2020-05-20 ENCOUNTER — Ambulatory Visit (INDEPENDENT_AMBULATORY_CARE_PROVIDER_SITE_OTHER): Payer: Medicare Other | Admitting: *Deleted

## 2020-05-20 DIAGNOSIS — I495 Sick sinus syndrome: Secondary | ICD-10-CM | POA: Diagnosis not present

## 2020-05-20 LAB — CUP PACEART REMOTE DEVICE CHECK
Battery Remaining Longevity: 122 mo
Battery Remaining Percentage: 95.5 %
Battery Voltage: 2.99 V
Brady Statistic AP VP Percent: 1 %
Brady Statistic AP VS Percent: 99 %
Brady Statistic AS VP Percent: 1 %
Brady Statistic AS VS Percent: 1 %
Brady Statistic RA Percent Paced: 99 %
Brady Statistic RV Percent Paced: 1 %
Date Time Interrogation Session: 20210727034448
Implantable Lead Implant Date: 20171019
Implantable Lead Implant Date: 20171019
Implantable Lead Location: 753859
Implantable Lead Location: 753860
Implantable Pulse Generator Implant Date: 20171019
Lead Channel Impedance Value: 430 Ohm
Lead Channel Impedance Value: 850 Ohm
Lead Channel Pacing Threshold Amplitude: 0.5 V
Lead Channel Pacing Threshold Amplitude: 0.75 V
Lead Channel Pacing Threshold Pulse Width: 0.4 ms
Lead Channel Pacing Threshold Pulse Width: 0.4 ms
Lead Channel Sensing Intrinsic Amplitude: 12 mV
Lead Channel Sensing Intrinsic Amplitude: 3.2 mV
Lead Channel Setting Pacing Amplitude: 2 V
Lead Channel Setting Pacing Amplitude: 2.5 V
Lead Channel Setting Pacing Pulse Width: 0.4 ms
Lead Channel Setting Sensing Sensitivity: 2 mV
Pulse Gen Model: 2272
Pulse Gen Serial Number: 3180458

## 2020-05-22 NOTE — Progress Notes (Signed)
Remote pacemaker transmission.   

## 2020-06-29 IMAGING — CT CT ABDOMEN AND PELVIS WITHOUT CONTRAST
2 of 4 series · 15 of 46 positions shown, 17 images · non-contrast
Comparison: CT the abdomen and pelvis 06/06/2015.

CLINICAL DATA: 78-year-old female with history of anemia.

EXAM:
CT ABDOMEN AND PELVIS WITHOUT CONTRAST
TECHNIQUE: Multidetector CT imaging of the abdomen and pelvis was performed
following the standard protocol without IV contrast.

[Series 2: axial st · axial · 0.81mm/px · z∈[-424,-9]mm · 12 of 95 slices shown, 14 images]
[im 6/95  soft-tissue]
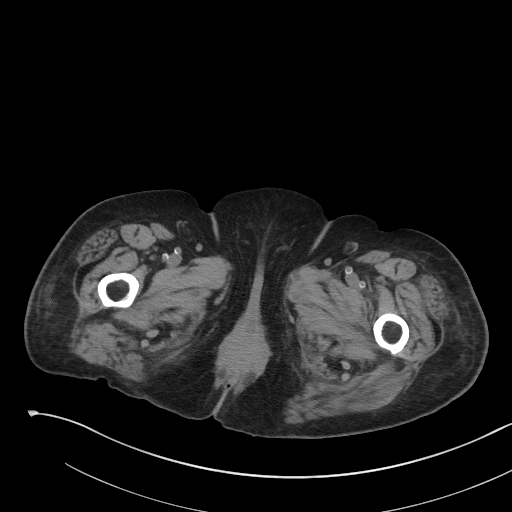
[im 6/95  bone]
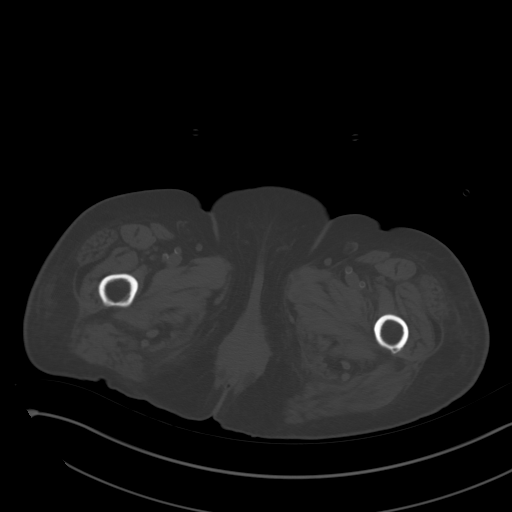
[im 16/95  soft-tissue]
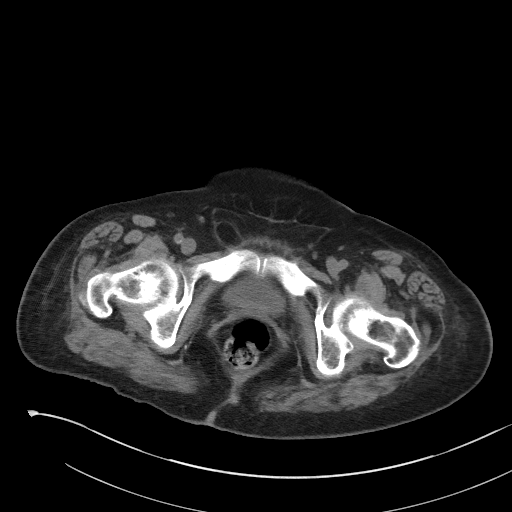
[im 21/95  soft-tissue]
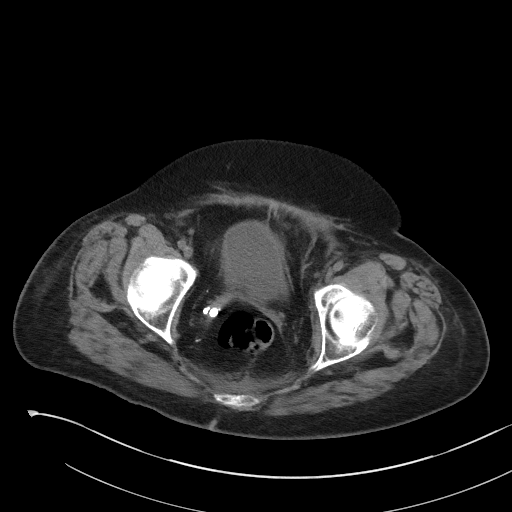
[im 27/95  soft-tissue]
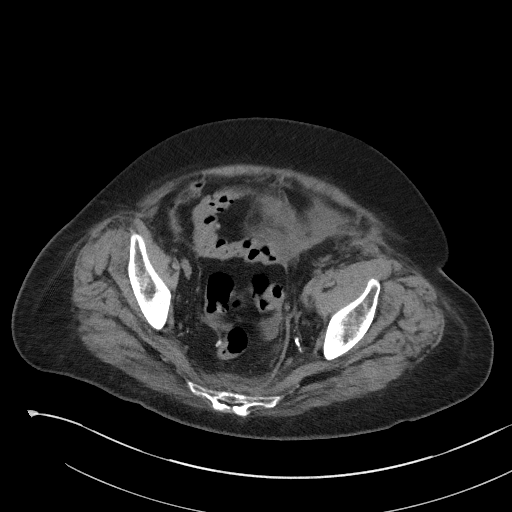
[im 37/95  soft-tissue]
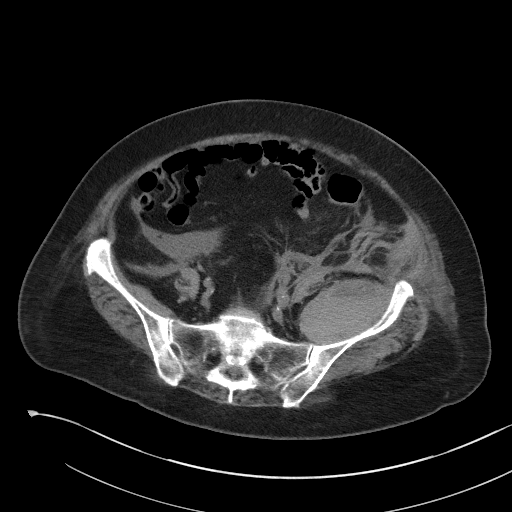
[im 42/95  soft-tissue]
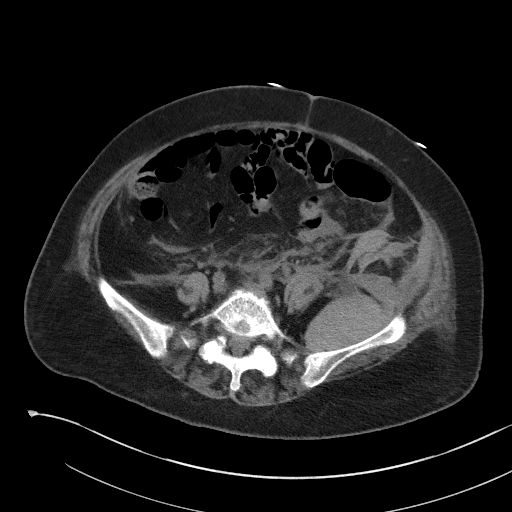
[im 53/95  soft-tissue]
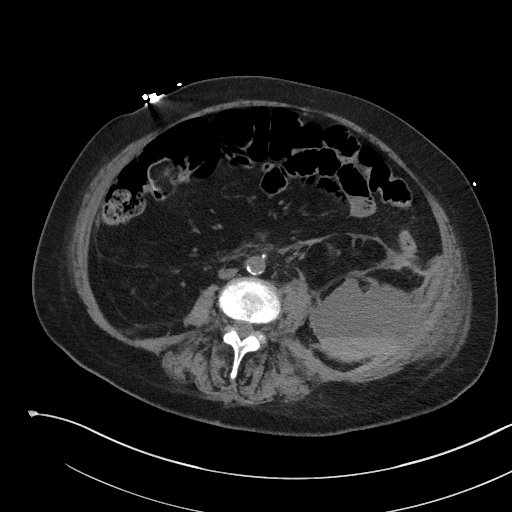
[im 58/95  soft-tissue]
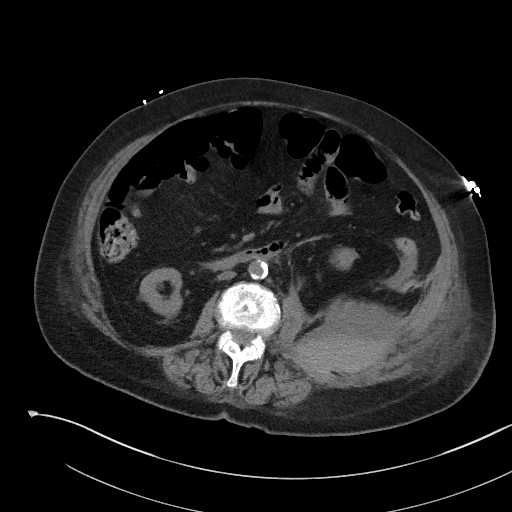
[im 68/95  soft-tissue]
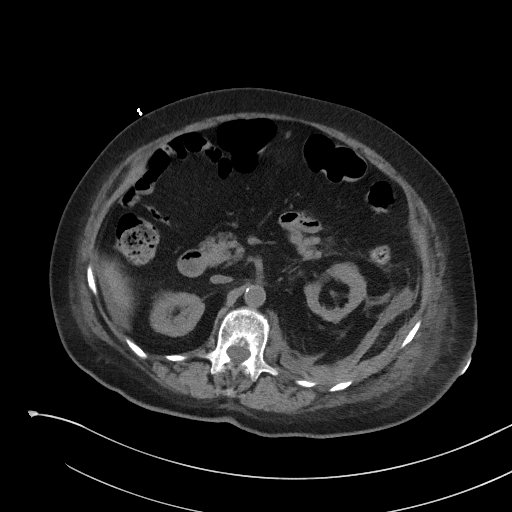
[im 68/95  bone]
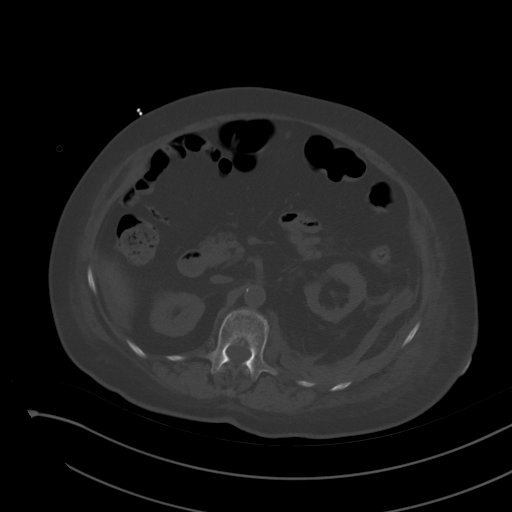
[im 74/95  soft-tissue]
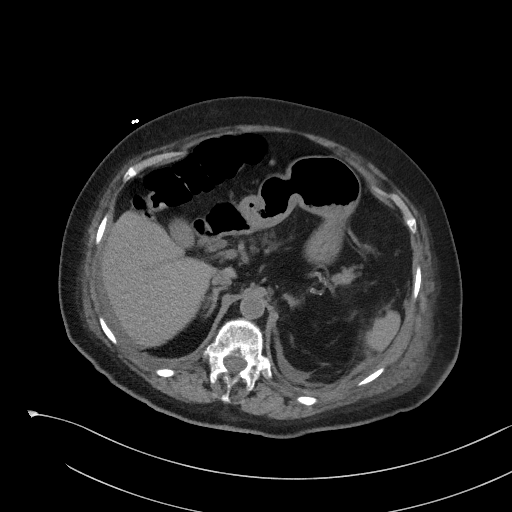
[im 79/95  soft-tissue]
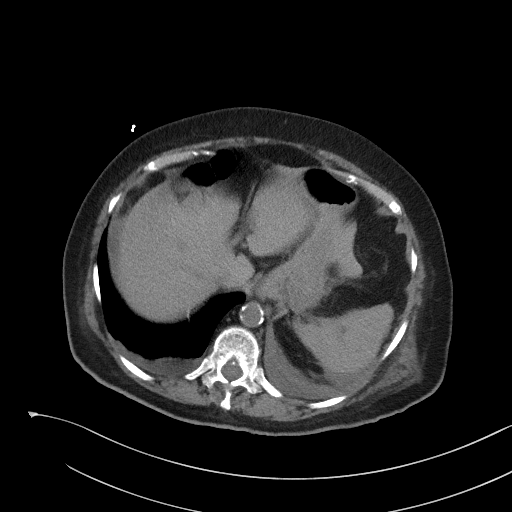
[im 89/95  soft-tissue]
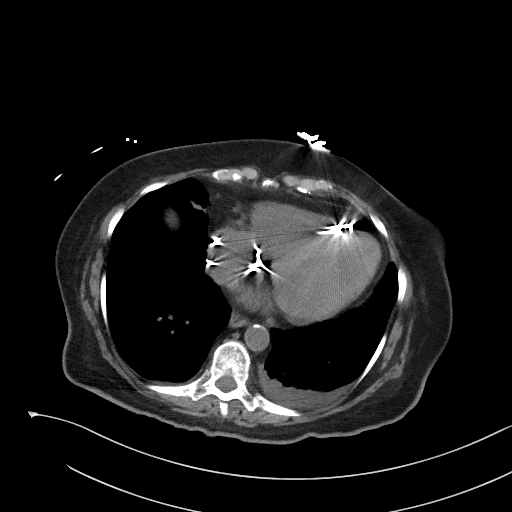

[Series 4: coronal st · coronal · 0.91mm/px · 3 of 95 slices shown]
[im 32/95  soft-tissue]
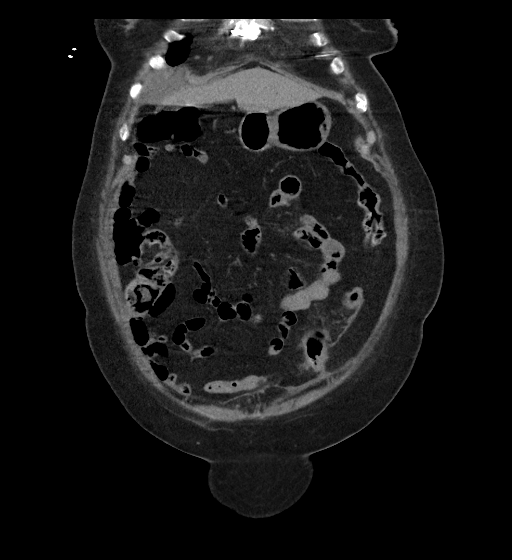
[im 42/95  soft-tissue]
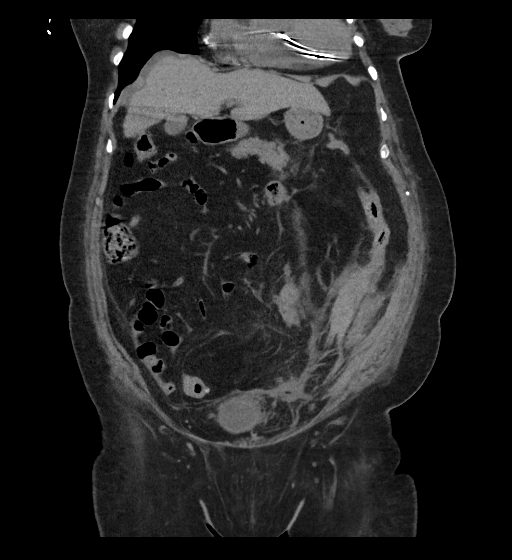
[im 53/95  soft-tissue]
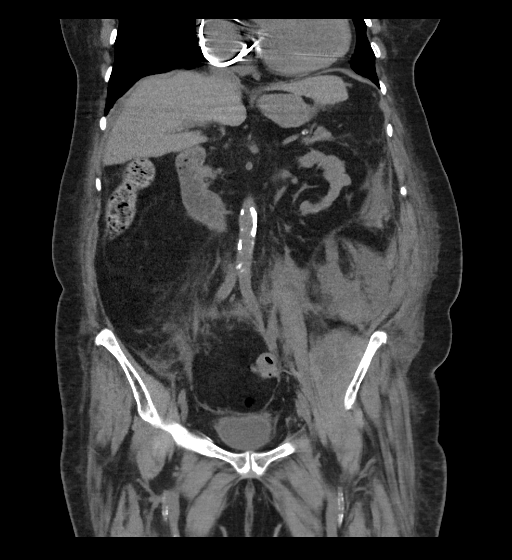

[15 of 46 positions shown; findings below may reference images not displayed]

FINDINGS: Lower chest: Trace right and small left pleural effusions. Minimal
dependent atelectasis in the left lower lobe. Patchy
peribronchovascular consolidation in the right middle lobe. Mild
cardiomegaly. Aortic atherosclerosis. Calcified atherosclerotic
plaque in the distal right coronary artery. Pacemaker leads in the
right atrium and right ventricle.

Hepatobiliary: No definite suspicious cystic or solid hepatic
lesions are confidently identified on today's noncontrast CT
examination. Calcified granuloma in the right lobe of the liver.
Unenhanced appearance of the gallbladder is unremarkable.

Pancreas: No definite pancreatic mass or peripancreatic fluid
collections or inflammatory changes noted on today's noncontrast CT
examination.

Spleen: Unremarkable.

Adrenals/Urinary Tract: There are 2 intermediate attenuation lesions
in the interpolar region of the left kidney measuring up to 1.3 cm
posteriorly (axial image 30 of series 2), which are incompletely
characterized on today's non-contrast CT examination, but
statistically likely proteinaceous/hemorrhagic cysts. Moderate renal
atrophy bilaterally. Unenhanced appearance of the adrenal glands is
normal. No hydroureteronephrosis. Urinary bladder is unremarkable in
appearance.

Stomach/Bowel: Unenhanced appearance of the stomach is normal. No
pathologic dilatation of small bowel or colon. Normal appendix.

Vascular/Lymphatic: Aortic atherosclerosis. No lymphadenopathy noted
in the abdomen or pelvis.

Reproductive: Status post hysterectomy. Ovaries are not confidently
identified may be surgically absent or atrophic.

Other: High attenuation fluid throughout the left retroperitoneum,
compatible with retroperitoneal hemorrhage. The origin of this is
uncertain, however, this likely has originated from the left iliacus
muscle (discussed below). The largest retroperitoneal portion of
this collection measures 8.2 x 10.7 x 5.4 cm (axial image 41 of
series 2 and sagittal image 81 of series 5). Trace volume of
ascites. No pneumoperitoneum.

Musculoskeletal: The left iliacus muscle appears markedly expanded
when compared to the contralateral side, and demonstrates layering
fluid fluid level within it (axial image 60 of series 2). The
enlarged left iliacus muscle measures 7.1 x 14.3 x 4.3 cm (axial
image 59 of series 2 and sagittal image 80 of series 5), as compared
with the contralateral side which is atrophic and difficult to
visualize. Some of this hemorrhage also appears to have extended
into the left quadratus lumborum muscle which also appears enlarged
compared to the contralateral side. There are no aggressive
appearing lytic or blastic lesions noted in the visualized portions
of the skeleton.
IMPRESSION: 1. Large intramuscular hemorrhage which appears centered in the left
iliacus muscle and to a lesser extent in the lower left quadratus
lumborum muscle, with extension into the left retroperitoneum, as
detailed above.
2. Trace right and small left pleural effusions lying dependently.
Some associated passive subsegmental atelectasis in the left lower
lobe.
3. Patchy peribronchovascular airspace consolidation in the right
middle lobe which could suggest a region of bronchopneumonia.
4. Trace volume of ascites.
5. Mild cardiomegaly.
6. Aortic atherosclerosis, in addition to at least right coronary
artery disease. Assessment for potential risk factor modification,
dietary therapy or pharmacologic therapy may be warranted, if
clinically indicated.

## 2020-08-04 ENCOUNTER — Other Ambulatory Visit: Payer: Self-pay | Admitting: Internal Medicine

## 2020-08-06 NOTE — Telephone Encounter (Signed)
Rx request sent to pharmacy.  

## 2020-08-19 ENCOUNTER — Ambulatory Visit (INDEPENDENT_AMBULATORY_CARE_PROVIDER_SITE_OTHER): Payer: Medicare Other

## 2020-08-19 DIAGNOSIS — I495 Sick sinus syndrome: Secondary | ICD-10-CM | POA: Diagnosis not present

## 2020-08-19 LAB — CUP PACEART REMOTE DEVICE CHECK
Battery Remaining Longevity: 119 mo
Battery Remaining Percentage: 95.5 %
Battery Voltage: 2.99 V
Brady Statistic AP VP Percent: 1 %
Brady Statistic AP VS Percent: 99 %
Brady Statistic AS VP Percent: 1 %
Brady Statistic AS VS Percent: 1 %
Brady Statistic RA Percent Paced: 99 %
Brady Statistic RV Percent Paced: 1 %
Date Time Interrogation Session: 20211026062527
Implantable Lead Implant Date: 20171019
Implantable Lead Implant Date: 20171019
Implantable Lead Location: 753859
Implantable Lead Location: 753860
Implantable Pulse Generator Implant Date: 20171019
Lead Channel Impedance Value: 390 Ohm
Lead Channel Impedance Value: 780 Ohm
Lead Channel Pacing Threshold Amplitude: 0.5 V
Lead Channel Pacing Threshold Amplitude: 0.75 V
Lead Channel Pacing Threshold Pulse Width: 0.4 ms
Lead Channel Pacing Threshold Pulse Width: 0.4 ms
Lead Channel Sensing Intrinsic Amplitude: 1.9 mV
Lead Channel Sensing Intrinsic Amplitude: 12 mV
Lead Channel Setting Pacing Amplitude: 2 V
Lead Channel Setting Pacing Amplitude: 2.5 V
Lead Channel Setting Pacing Pulse Width: 0.4 ms
Lead Channel Setting Sensing Sensitivity: 2 mV
Pulse Gen Model: 2272
Pulse Gen Serial Number: 3180458

## 2020-08-21 NOTE — Progress Notes (Signed)
Remote pacemaker transmission.   

## 2020-10-03 ENCOUNTER — Emergency Department (HOSPITAL_COMMUNITY): Payer: Medicare Other

## 2020-10-03 ENCOUNTER — Encounter (HOSPITAL_COMMUNITY): Payer: Self-pay | Admitting: Emergency Medicine

## 2020-10-03 ENCOUNTER — Inpatient Hospital Stay (HOSPITAL_COMMUNITY)
Admission: EM | Admit: 2020-10-03 | Discharge: 2020-10-05 | DRG: 683 | Disposition: A | Discharge status: Home Health | Payer: Medicare Other | Attending: Internal Medicine | Admitting: Internal Medicine

## 2020-10-03 DIAGNOSIS — I2510 Atherosclerotic heart disease of native coronary artery without angina pectoris: Secondary | ICD-10-CM | POA: Diagnosis present

## 2020-10-03 DIAGNOSIS — E785 Hyperlipidemia, unspecified: Secondary | ICD-10-CM | POA: Diagnosis present

## 2020-10-03 DIAGNOSIS — R419 Unspecified symptoms and signs involving cognitive functions and awareness: Secondary | ICD-10-CM | POA: Diagnosis present

## 2020-10-03 DIAGNOSIS — M19042 Primary osteoarthritis, left hand: Secondary | ICD-10-CM | POA: Diagnosis present

## 2020-10-03 DIAGNOSIS — I484 Atypical atrial flutter: Secondary | ICD-10-CM | POA: Diagnosis present

## 2020-10-03 DIAGNOSIS — I5032 Chronic diastolic (congestive) heart failure: Secondary | ICD-10-CM | POA: Diagnosis present

## 2020-10-03 DIAGNOSIS — K2100 Gastro-esophageal reflux disease with esophagitis, without bleeding: Secondary | ICD-10-CM | POA: Diagnosis present

## 2020-10-03 DIAGNOSIS — E1165 Type 2 diabetes mellitus with hyperglycemia: Secondary | ICD-10-CM | POA: Diagnosis present

## 2020-10-03 DIAGNOSIS — Z20822 Contact with and (suspected) exposure to covid-19: Secondary | ICD-10-CM | POA: Diagnosis present

## 2020-10-03 DIAGNOSIS — M170 Bilateral primary osteoarthritis of knee: Secondary | ICD-10-CM | POA: Diagnosis present

## 2020-10-03 DIAGNOSIS — Z8249 Family history of ischemic heart disease and other diseases of the circulatory system: Secondary | ICD-10-CM

## 2020-10-03 DIAGNOSIS — I5033 Acute on chronic diastolic (congestive) heart failure: Secondary | ICD-10-CM | POA: Diagnosis present

## 2020-10-03 DIAGNOSIS — Z950 Presence of cardiac pacemaker: Secondary | ICD-10-CM

## 2020-10-03 DIAGNOSIS — Z951 Presence of aortocoronary bypass graft: Secondary | ICD-10-CM | POA: Diagnosis not present

## 2020-10-03 DIAGNOSIS — Z85828 Personal history of other malignant neoplasm of skin: Secondary | ICD-10-CM

## 2020-10-03 DIAGNOSIS — Z79890 Hormone replacement therapy (postmenopausal): Secondary | ICD-10-CM

## 2020-10-03 DIAGNOSIS — I130 Hypertensive heart and chronic kidney disease with heart failure and stage 1 through stage 4 chronic kidney disease, or unspecified chronic kidney disease: Secondary | ICD-10-CM | POA: Diagnosis present

## 2020-10-03 DIAGNOSIS — R110 Nausea: Secondary | ICD-10-CM | POA: Diagnosis present

## 2020-10-03 DIAGNOSIS — E1122 Type 2 diabetes mellitus with diabetic chronic kidney disease: Secondary | ICD-10-CM | POA: Diagnosis present

## 2020-10-03 DIAGNOSIS — F419 Anxiety disorder, unspecified: Secondary | ICD-10-CM | POA: Diagnosis present

## 2020-10-03 DIAGNOSIS — R109 Unspecified abdominal pain: Secondary | ICD-10-CM | POA: Diagnosis present

## 2020-10-03 DIAGNOSIS — Z888 Allergy status to other drugs, medicaments and biological substances status: Secondary | ICD-10-CM

## 2020-10-03 DIAGNOSIS — R778 Other specified abnormalities of plasma proteins: Secondary | ICD-10-CM | POA: Diagnosis present

## 2020-10-03 DIAGNOSIS — K219 Gastro-esophageal reflux disease without esophagitis: Secondary | ICD-10-CM | POA: Diagnosis present

## 2020-10-03 DIAGNOSIS — I248 Other forms of acute ischemic heart disease: Secondary | ICD-10-CM | POA: Diagnosis present

## 2020-10-03 DIAGNOSIS — Z90710 Acquired absence of both cervix and uterus: Secondary | ICD-10-CM

## 2020-10-03 DIAGNOSIS — M19041 Primary osteoarthritis, right hand: Secondary | ICD-10-CM | POA: Diagnosis present

## 2020-10-03 DIAGNOSIS — Z7901 Long term (current) use of anticoagulants: Secondary | ICD-10-CM

## 2020-10-03 DIAGNOSIS — I252 Old myocardial infarction: Secondary | ICD-10-CM

## 2020-10-03 DIAGNOSIS — N184 Chronic kidney disease, stage 4 (severe): Secondary | ICD-10-CM | POA: Diagnosis present

## 2020-10-03 DIAGNOSIS — E1142 Type 2 diabetes mellitus with diabetic polyneuropathy: Secondary | ICD-10-CM | POA: Diagnosis present

## 2020-10-03 DIAGNOSIS — Z83438 Family history of other disorder of lipoprotein metabolism and other lipidemia: Secondary | ICD-10-CM

## 2020-10-03 DIAGNOSIS — F329 Major depressive disorder, single episode, unspecified: Secondary | ICD-10-CM | POA: Diagnosis present

## 2020-10-03 DIAGNOSIS — N189 Chronic kidney disease, unspecified: Secondary | ICD-10-CM | POA: Diagnosis not present

## 2020-10-03 DIAGNOSIS — D631 Anemia in chronic kidney disease: Secondary | ICD-10-CM | POA: Diagnosis present

## 2020-10-03 DIAGNOSIS — R112 Nausea with vomiting, unspecified: Secondary | ICD-10-CM | POA: Diagnosis present

## 2020-10-03 DIAGNOSIS — Z833 Family history of diabetes mellitus: Secondary | ICD-10-CM

## 2020-10-03 DIAGNOSIS — R627 Adult failure to thrive: Secondary | ICD-10-CM

## 2020-10-03 DIAGNOSIS — Z882 Allergy status to sulfonamides status: Secondary | ICD-10-CM

## 2020-10-03 DIAGNOSIS — I495 Sick sinus syndrome: Secondary | ICD-10-CM | POA: Diagnosis present

## 2020-10-03 DIAGNOSIS — N179 Acute kidney failure, unspecified: Secondary | ICD-10-CM | POA: Diagnosis not present

## 2020-10-03 DIAGNOSIS — D638 Anemia in other chronic diseases classified elsewhere: Secondary | ICD-10-CM | POA: Diagnosis present

## 2020-10-03 DIAGNOSIS — E039 Hypothyroidism, unspecified: Secondary | ICD-10-CM | POA: Diagnosis present

## 2020-10-03 DIAGNOSIS — M479 Spondylosis, unspecified: Secondary | ICD-10-CM | POA: Diagnosis present

## 2020-10-03 DIAGNOSIS — I480 Paroxysmal atrial fibrillation: Secondary | ICD-10-CM | POA: Diagnosis present

## 2020-10-03 DIAGNOSIS — Z91018 Allergy to other foods: Secondary | ICD-10-CM

## 2020-10-03 DIAGNOSIS — Z79899 Other long term (current) drug therapy: Secondary | ICD-10-CM

## 2020-10-03 DIAGNOSIS — Z794 Long term (current) use of insulin: Secondary | ICD-10-CM

## 2020-10-03 LAB — URINALYSIS, ROUTINE W REFLEX MICROSCOPIC
Bilirubin Urine: NEGATIVE
Glucose, UA: NEGATIVE mg/dL
Hgb urine dipstick: NEGATIVE
Ketones, ur: NEGATIVE mg/dL
Leukocytes,Ua: NEGATIVE
Nitrite: NEGATIVE
Protein, ur: NEGATIVE mg/dL
Specific Gravity, Urine: 1.01 (ref 1.005–1.030)
pH: 5 (ref 5.0–8.0)

## 2020-10-03 LAB — RESP PANEL BY RT-PCR (FLU A&B, COVID) ARPGX2
Influenza A by PCR: NEGATIVE
Influenza B by PCR: NEGATIVE
SARS Coronavirus 2 by RT PCR: NEGATIVE

## 2020-10-03 LAB — COMPREHENSIVE METABOLIC PANEL
ALT: 17 U/L (ref 0–44)
AST: 26 U/L (ref 15–41)
Albumin: 3.5 g/dL (ref 3.5–5.0)
Alkaline Phosphatase: 75 U/L (ref 38–126)
Anion gap: 16 — ABNORMAL HIGH (ref 5–15)
BUN: 93 mg/dL — ABNORMAL HIGH (ref 8–23)
CO2: 21 mmol/L — ABNORMAL LOW (ref 22–32)
Calcium: 9.1 mg/dL (ref 8.9–10.3)
Chloride: 96 mmol/L — ABNORMAL LOW (ref 98–111)
Creatinine, Ser: 2.82 mg/dL — ABNORMAL HIGH (ref 0.44–1.00)
GFR, Estimated: 16 mL/min — ABNORMAL LOW (ref >60)
Glucose, Bld: 296 mg/dL — ABNORMAL HIGH (ref 70–99)
Potassium: 4 mmol/L (ref 3.5–5.1)
Sodium: 133 mmol/L — ABNORMAL LOW (ref 135–145)
Total Bilirubin: 0.7 mg/dL (ref 0.3–1.2)
Total Protein: 6.9 g/dL (ref 6.5–8.1)

## 2020-10-03 LAB — CBC
HCT: 30.2 % — ABNORMAL LOW (ref 36.0–46.0)
Hemoglobin: 10.5 g/dL — ABNORMAL LOW (ref 12.0–15.0)
MCH: 34.1 pg — ABNORMAL HIGH (ref 26.0–34.0)
MCHC: 34.8 g/dL (ref 30.0–36.0)
MCV: 98.1 fL (ref 80.0–100.0)
Platelets: 185 10*3/uL (ref 150–400)
RBC: 3.08 MIL/uL — ABNORMAL LOW (ref 3.87–5.11)
RDW: 14 % (ref 11.5–15.5)
WBC: 8 10*3/uL (ref 4.0–10.5)
nRBC: 0 % (ref 0.0–0.2)

## 2020-10-03 LAB — CBG MONITORING, ED: Glucose-Capillary: 198 mg/dL — ABNORMAL HIGH (ref 70–99)

## 2020-10-03 LAB — CREATININE, URINE, RANDOM: Creatinine, Urine: 69.09 mg/dL

## 2020-10-03 LAB — SODIUM, URINE, RANDOM: Sodium, Ur: 22 mmol/L

## 2020-10-03 LAB — TROPONIN I (HIGH SENSITIVITY)
Troponin I (High Sensitivity): 110 ng/L (ref <18)
Troponin I (High Sensitivity): 102 ng/L (ref <18)

## 2020-10-03 LAB — LIPASE, BLOOD: Lipase: 82 U/L — ABNORMAL HIGH (ref 11–51)

## 2020-10-03 MED ORDER — SUCRALFATE 1 G PO TABS
1.0000 g | ORAL_TABLET | Freq: Two times a day (BID) | ORAL | Status: DC
  Administered 2020-10-03 – 2020-10-05 (×4): 1 g via ORAL
  Filled 2020-10-03 (×4): qty 1

## 2020-10-03 MED ORDER — ASPIRIN EC 81 MG PO TBEC
81.0000 mg | DELAYED_RELEASE_TABLET | Freq: Every day | ORAL | Status: DC
Start: 2020-10-04 — End: 2020-10-05
  Administered 2020-10-04 – 2020-10-05 (×2): 81 mg via ORAL
  Filled 2020-10-03 (×2): qty 1

## 2020-10-03 MED ORDER — CARVEDILOL 25 MG PO TABS
25.0000 mg | ORAL_TABLET | Freq: Two times a day (BID) | ORAL | Status: DC
  Administered 2020-10-03 – 2020-10-05 (×4): 25 mg via ORAL
  Filled 2020-10-03: qty 8
  Filled 2020-10-03 (×3): qty 1

## 2020-10-03 MED ORDER — ONDANSETRON HCL 4 MG PO TABS: 4.0000 mg | ORAL_TABLET | Freq: Four times a day (QID) | ORAL | Status: DC | PRN

## 2020-10-03 MED ORDER — PANTOPRAZOLE SODIUM 40 MG PO TBEC
40.0000 mg | DELAYED_RELEASE_TABLET | Freq: Every day | ORAL | Status: DC
  Administered 2020-10-03 – 2020-10-05 (×3): 40 mg via ORAL
  Filled 2020-10-03 (×3): qty 1

## 2020-10-03 MED ORDER — ACETAMINOPHEN 325 MG PO TABS: 650.0000 mg | ORAL_TABLET | Freq: Four times a day (QID) | ORAL | Status: DC | PRN

## 2020-10-03 MED ORDER — HEPARIN SODIUM (PORCINE) 5000 UNIT/ML IJ SOLN
5000.0000 [IU] | Freq: Three times a day (TID) | INTRAMUSCULAR | Status: DC
  Administered 2020-10-03 – 2020-10-05 (×5): 5000 [IU] via SUBCUTANEOUS
  Filled 2020-10-03 (×5): qty 1

## 2020-10-03 MED ORDER — ONDANSETRON HCL 4 MG/2ML IJ SOLN: 4.0000 mg | Freq: Four times a day (QID) | INTRAMUSCULAR | Status: DC | PRN

## 2020-10-03 MED ORDER — INSULIN GLARGINE 100 UNIT/ML ~~LOC~~ SOLN
22.0000 [IU] | Freq: Every day | SUBCUTANEOUS | Status: DC
  Administered 2020-10-04 – 2020-10-05 (×2): 22 [IU] via SUBCUTANEOUS
  Filled 2020-10-03 (×2): qty 0.22

## 2020-10-03 MED ORDER — SODIUM CHLORIDE 0.9 % IV BOLUS: 500.0000 mL | Freq: Once | INTRAVENOUS | Status: DC

## 2020-10-03 MED ORDER — SODIUM CHLORIDE 0.9 % IV SOLN: INTRAVENOUS | Status: AC

## 2020-10-03 MED ORDER — POLYETHYL GLYCOL-PROPYL GLYCOL 0.4-0.3 % OP GEL: Freq: Two times a day (BID) | OPHTHALMIC | Status: DC | PRN

## 2020-10-03 MED ORDER — ACETAMINOPHEN 650 MG RE SUPP: 650.0000 mg | Freq: Four times a day (QID) | RECTAL | Status: DC | PRN

## 2020-10-03 MED ORDER — DICLOFENAC SODIUM 1 % EX GEL
2.0000 g | Freq: Two times a day (BID) | CUTANEOUS | Status: DC | PRN
  Filled 2020-10-03: qty 100

## 2020-10-03 MED ORDER — ONDANSETRON HCL 4 MG/2ML IJ SOLN
4.0000 mg | Freq: Once | INTRAMUSCULAR | Status: AC
  Administered 2020-10-03: 4 mg via INTRAVENOUS
  Filled 2020-10-03: qty 2

## 2020-10-03 MED ORDER — INSULIN ASPART 100 UNIT/ML ~~LOC~~ SOLN: 0.0000 [IU] | Freq: Every day | SUBCUTANEOUS | Status: DC

## 2020-10-03 MED ORDER — LEVOTHYROXINE SODIUM 88 MCG PO TABS
88.0000 ug | ORAL_TABLET | Freq: Every day | ORAL | Status: DC
  Administered 2020-10-04 – 2020-10-05 (×2): 88 ug via ORAL
  Filled 2020-10-03 (×2): qty 1

## 2020-10-03 MED ORDER — DOCUSATE SODIUM 100 MG PO CAPS: 100.0000 mg | ORAL_CAPSULE | Freq: Two times a day (BID) | ORAL | Status: DC | PRN

## 2020-10-03 MED ORDER — SODIUM CHLORIDE 0.9 % IV SOLN: INTRAVENOUS | Status: DC

## 2020-10-03 MED ORDER — SODIUM CHLORIDE 0.9% FLUSH
3.0000 mL | Freq: Two times a day (BID) | INTRAVENOUS | Status: DC
  Administered 2020-10-03 – 2020-10-05 (×2): 3 mL via INTRAVENOUS

## 2020-10-03 MED ORDER — ROSUVASTATIN CALCIUM 5 MG PO TABS
10.0000 mg | ORAL_TABLET | Freq: Every day | ORAL | Status: DC
  Administered 2020-10-03 – 2020-10-05 (×3): 10 mg via ORAL
  Filled 2020-10-03 (×3): qty 2

## 2020-10-03 MED ORDER — GABAPENTIN 300 MG PO CAPS
300.0000 mg | ORAL_CAPSULE | Freq: Every day | ORAL | Status: DC
  Administered 2020-10-03 – 2020-10-04 (×2): 300 mg via ORAL
  Filled 2020-10-03 (×3): qty 1

## 2020-10-03 MED ORDER — ALBUTEROL SULFATE (2.5 MG/3ML) 0.083% IN NEBU: 2.5000 mg | INHALATION_SOLUTION | Freq: Four times a day (QID) | RESPIRATORY_TRACT | Status: DC | PRN

## 2020-10-03 MED ORDER — INSULIN ASPART 100 UNIT/ML ~~LOC~~ SOLN
0.0000 [IU] | Freq: Three times a day (TID) | SUBCUTANEOUS | Status: DC
  Administered 2020-10-04 (×2): 2 [IU] via SUBCUTANEOUS

## 2020-10-03 MED ORDER — SODIUM CHLORIDE 0.9 % IV BOLUS
500.0000 mL | Freq: Once | INTRAVENOUS | Status: AC
  Administered 2020-10-03: 500 mL via INTRAVENOUS

## 2020-10-03 NOTE — ED Provider Notes (Signed)
[facility] EMERGENCY DEPARTMENT Provider Note   CSN: [id number] Arrival date & time: 10/03/20  1254     History Chief Complaint  Patient presents with  . Abdominal Pain    [name] is a 79 y.o. female with a past medical history of anemia, CAD, CHF, CKD stage III, history of diverticulosis, GERD, esophageal stricture, hyperlipidemia, hypertension anxiety, paroxysmal atrial fibrillation previous MI, tachybradycardia syndrome, mild memory deficits, status post cardiac pacemaker, and status post CABG who presents emergency department chief complaint of nausea and epigastric abdominal pain.  History is given by the patient and her daughter who is at bedside.  She has had 1 month of epigastric abdominal pain.  Because of this she has had decreased appetite and did not want to take her medications.  She developed severe sore throat pain and was diagnosed with thrush.  She was treated with Diflucan and improved.  Over the past month her symptoms have been fairly constant, not worsened by anything specific.  The pain does not radiate into her shoulder or back.  She denies chest pain or shortness of breath.  Over the past week her pain has improved but she has become constantly nauseated.  She has not had any significant emesis.  Her daughter states that she has been fairly weak.  She has some unsteadiness of gait and peripheral neuropathy and states that she has had a couple of falls but no head injuries.  She has no previous history of abdominal surgeries.  Her appetite has been very poor with decreased urinary output and a weight loss of approximately 7 pounds.  HPI     Past Medical History:  Diagnosis Date  . Anemia   . Anxiety   . Arthritis    "in my hands; knees, back" (09/27/2018)  . CAD (coronary artery disease)    a. 40-59% bilaterally 10/2015.  Marland Kitchen Chronic diastolic CHF (congestive heart failure) ([place])    a. 05/2016 Echo: EF 60-65%, no rwma, Gr1 DD, Ao sclerosis  w/o stenosis, triv MR;  b. 07/2016 TEE: EF 55-60%, no rwma, mild MR.  . CKD (chronic kidney disease), stage III ([place])   . Coronary artery disease    a. 02/2007 Persantine MV: low risk;  b. 11/2011 CABG x 3 (LIMA->LAD, VG->OM, VG->RCA);  c. 05/2016 MV: EF >65%, no isch/infarct, horiz ST dep in I, II, V5-V6.  Marland Kitchen Depression   . Diverticulosis   . Esophageal stricture   . GERD (gastroesophageal reflux disease)   . Hemorrhoids   . Hiatal hernia   . Hyperkalemia    a. ARB stopped due to this.  . Hyperlipidemia   . Hypertension   . Hypertensive heart disease   . Hypothyroidism   . Major depressive disorder with anxious distress 09/05/2019  . Mild cognitive impairment    a. seen by neurology.  . Mild vascular neurocognitive disorder ([place]) 09/05/2019  . Myocardial infarction ([place]) 12/07/2011  . PAF (paroxysmal atrial fibrillation) (HCC)    a. post-op CABG.  . Pain    RIGHT KNEE PAIN - TORN RIGHT MEDIAL MENISCUS  . Paroxysmal atrial flutter ([place])    a. 07/2016 s/p TEE & DCCV;  b. 07/2016 Recurrent PAFlutter req initiation of amio & PPM in setting of tachy-brady;  c. CHA2DS2VASc = 7-->Xarelto 15 mg QD.  Marland Kitchen Pneumonia    "twice" (09/27/2018)  . PONV (postoperative nausea and vomiting)   . Presence of permanent cardiac pacemaker 08/12/2016  . S/P CABG (coronary artery bypass graft),  12/04/11 12/07/2011   LIMA to LAD, SVG to OM, SVG to RCA  . Sinus bradycardia    a. not on BB due to this.  . Skin cancer    "face" (09/27/2018)  . Tachy-brady syndrome ([place])    a. 07/2016 Jxnl brady following DCCV, recurrent Aflutter-->amio + SJM 2272 Assurity MRI DC PPM (ser # [id number]).  . Type II diabetes mellitus [facility])     Patient Active Problem List   Diagnosis Date Noted  . Mild vascular neurocognitive disorder 09/05/2019  . Major depressive disorder with anxious distress 09/05/2019  . Coag negative Staphylococcus bacteremia 05/21/2019  . Acute blood loss anemia 05/21/2019  . Anemia of chronic disease  05/21/2019  . Anemia in chronic kidney disease 05/21/2019  . SSS (sick sinus syndrome) ([place])   . Paroxysmal atrial fibrillation (HCC)   . Acute metabolic encephalopathy   . Retroperitoneal hematoma   . Muscle hemorrhage   . S/P placement of cardiac pacemaker   . SIRS (systemic inflammatory response syndrome) ([place]) 05/15/2019  . Acute encephalopathy 05/15/2019  . Status post coronary artery stent placement   . CAD S/P percutaneous coronary angioplasty 09/27/2018  . Abnormal nuclear stress test   . Pain of left shoulder joint on movement 06/16/2018  . Closed fracture of upper end of humerus 06/16/2018  . Pain in right knee 04/12/2018  . Spinal stenosis of lumbar region 02/28/2018  . On anticoagulant therapy 04/22/2017  . Tachycardia-bradycardia syndrome ([place]) 12/23/2016  . Accelerated hypertension   . Chronic diastolic CHF (congestive heart failure) ([place]) 08/09/2016  . Elevated troponin 08/09/2016  . Hypertensive urgency 08/08/2016  . Paroxysmal atrial flutter ([place]) 08/08/2016  . Coronary artery disease   . Hypertensive heart disease   . CKD (chronic kidney disease) stage 4, GFR 15-29 ml/min (HCC)   . Other specified hypothyroidism   . Acute anemia   . Pain in the chest   . Shortness of breath 05/23/2016  . Pre-syncope 05/23/2016  . Chest tightness 09/16/2014  . Osteoarthritis of right knee 07/02/2014  . Acute medial meniscus tear of right knee 07/02/2014  . Diabetic peripheral neuropathy associated with type 1 diabetes mellitus ([place]) 09/05/2013  . Edema of extremities 04/18/2013  . Cellulitis 04/18/2013  . Essential hypertension 01/06/2012  . Acute renal failure,admitted 01/03/12, after admission for diastolic chf 16/10/930  . S/P CABG (coronary artery bypass graft), 12/04/11 12/07/2011  . Hyperlipidemia LDL goal <70 12/03/2011    Class: Diagnosis of  . NSTEMI (non-ST elevated myocardial infarction) ([place]) 12/01/2011  . Family history of early CAD 12/01/2011  . Insulin  dependent diabetes mellitus 12/14/2010  . DYSPHAGIA UNSPECIFIED 12/14/2010  . ABDOMINAL PAIN, EPIGASTRIC 12/14/2010  . ESOPHAGEAL STRICTURE 11/28/2009  . GERD 11/28/2009  . FLATULENCE-GAS-BLOATING 11/28/2009  . CONSTIPATION, CHRONIC 12/19/2007  . SALPINGO-OOPHORITIS 12/19/2007  . MENOPAUSE, SURGICAL 12/19/2007  . BACK PAIN, LUMBAR 12/19/2007  . ANKLE INJURY, RIGHT 12/19/2007  . TOTAL KNEE REPLACEMENT, LEFT, HX OF 12/19/2007    Past Surgical History:  Procedure Laterality Date  . ABDOMINAL HYSTERECTOMY  1980's  . ANKLE FRACTURE SURGERY Right    "put pins both side right ankle"  . BACK SURGERY  2006   "cyst growing near my spine"  . CARDIAC CATHETERIZATION  12/02/2011   mild LV dysfunction with mod hypocontractility of mid-distal anterolateral wall; CAD w/ostial tapering of L [place] with 50% diffuse ostial narrowing of LAD, 99% eccentric focal prox LAD stenosis followed by 70% prox LAD stenosis after 1st diag, 20% mid LAD narrowing;  80% ostial-to-prox L Cfx stenosis & 40-50% irregularity of RCA (Dr. Corky Downs)  . CARDIOVERSION N/A 08/11/2016   Procedure: CARDIOVERSION;  Surgeon: Lelon Perla, MD;  Location: [facility] ENDOSCOPY;  Service: Cardiovascular;  Laterality: N/A;  . CATARACT EXTRACTION W/ INTRAOCULAR LENS  IMPLANT, BILATERAL Bilateral ~ 2010  . [place]  . CORONARY ARTERY BYPASS GRAFT  12/04/2011   Procedure: CORONARY ARTERY BYPASS GRAFTING (CABG);  Surgeon: Tharon Aquas Adelene Idler, MD;  Location: [facility];  Service: Open Heart Surgery;  Laterality: N/A;  CABG x three,  using left internal mammary artery, and right leg greater saphenous vein harvested endoscopically  . CORONARY STENT INTERVENTION N/A 09/27/2018   Procedure: CORONARY STENT INTERVENTION;  Surgeon: Troy Sine, MD;  Location: [facility] CV LAB;  Service: Cardiovascular;  Laterality: N/A;  . DILATION AND CURETTAGE OF UTERUS     "a couple times"  . EP IMPLANTABLE DEVICE N/A 08/12/2016   Procedure: Pacemaker  Implant;  Surgeon: Will Meredith Leeds, MD;  Location: [facility] CV LAB;  Service: Cardiovascular;  Laterality: N/A;  . ESOPHAGOGASTRODUODENOSCOPY (EGD) WITH ESOPHAGEAL DILATION    . FRACTURE SURGERY    . JOINT REPLACEMENT    . KNEE ARTHROSCOPY WITH MEDIAL MENISECTOMY Right 07/02/2014   Procedure: RIGHT KNEE ARTHROSCOPY WITH PARTIAL MEDIAL MENISTECTOMY, ABRASION CONDROPLASTYU OF PATELLA,ABRASION CONDROPLASTY OF MEDIAL FEMEROL CONDYL, MICROFRACTURE OF MEDIAL FEMEROL CONDYL;  Surgeon: Tobi Bastos, MD;  Location: [facility] ORS;  Service: Orthopedics;  Laterality: Right;  . LEFT HEART CATH AND CORS/GRAFTS ANGIOGRAPHY N/A 09/06/2018   Procedure: LEFT HEART CATH AND CORS/GRAFTS ANGIOGRAPHY;  Surgeon: Troy Sine, MD;  Location: [facility] CV LAB;  Service: Cardiovascular;  Laterality: N/A;  . LEFT HEART CATHETERIZATION WITH CORONARY ANGIOGRAM N/A 12/02/2011   Procedure: LEFT HEART CATHETERIZATION WITH CORONARY ANGIOGRAM;  Surgeon: Troy Sine, MD;  Location[facility] C[facility] LAB;  Service: Cardiovascular;  Laterality: N/A;  Coronary angiogram, possible PCI  . TEE WITHOUT CARDIOVERSION N/A 08/11/2016   Procedure: TRANSESOPHAGEAL ECHOCARDIOGRAM (TEE);  Surgeon: Lelon Perla, MD;  Location: [facility] ENDOSCOPY;  Service: Cardiovascular;  Laterality: N/A;  . TEE WITHOUT CARDIOVERSION N/A 05/18/2019   Procedure: TRANSESOPHAGEAL ECHOCARDIOGRAM (TEE);  Surgeon: [name], MD;  Location: [facility];  Service: Cardiovascular;  Laterality: N/A;  . TONSILLECTOMY  1949  . TOTAL KNEE ARTHROPLASTY Left ~ 2006  . TRANSTHORACIC ECHOCARDIOGRAM  02/19/2013   EF 71-06%, grade 1 diastolic dysfunction; mildly thickend/calcified AV leaflets; mildly calcidied MV annulus; mild TR     OB History   No obstetric history on file.     Family History  Problem Relation Age of Onset  . Diabetes Mother   . CVA Mother   . Hypertension Mother   . Heart disease Father   . Hyperlipidemia Father   . Breast cancer Sister         x 3  . Heart disease Brother        x5; one with MI  . Heart disease Sister        x3  . Diabetes Sister        x3  . Lung cancer Sister   . Breast cancer Sister        x2  . Colon cancer Neg Hx     Social History   Tobacco Use  . Smoking status: Never Smoker  . Smokeless tobacco: Never Used  Vaping Use  . Vaping Use: Never used  Substance Use Topics  . Alcohol use:  Not Currently    Comment: very occasional   . Drug use: Never    Home Medications Prior to Admission medications   Medication Sig Start Date End Date Taking? Authorizing Provider  acetaminophen (TYLENOL) 650 MG CR tablet Take 650 mg by mouth every 8 (eight) hours as needed for pain.   Yes [provider]  carvedilol (COREG) 25 MG tablet Take 1 tablet (25 mg total) by mouth 2 (two) times daily. 10/10/19  Yes Hilty, Nadean Corwin, MD  docusate sodium (COLACE) 100 MG capsule Take 100 mg by mouth 2 (two) times daily as needed for mild constipation or moderate constipation.   Yes [provider]  Ascorbic Acid (VITAMIN C PO) Take 1 tablet by mouth 3 (three) times a week.    [provider]  Cyanocobalamin (VITAMIN B-12 PO) Take 1 tablet by mouth 3 (three) times a week.     [provider]  esomeprazole (NEXIUM) 20 MG capsule TAKE 1 CAPSULE BY MOUTH ONCE DAILY AT 12 NOON 08/08/19   [provider]  ferrous sulfate 325 (65 FE) MG EC tablet Take 325 mg by mouth daily with breakfast.    [provider]  gabapentin (NEURONTIN) 300 MG capsule Take 1 capsule (300 mg total) by mouth at bedtime. 05/22/19   Roney Jaffe, MD  LANTUS SOLOSTAR 100 UNIT/ML Solostar Pen Inject 22 Units into the skin daily. 08/08/18   [provider]  levothyroxine (SYNTHROID) 75 MCG tablet Take 75 mcg by mouth every morning. 02/07/20   [provider]  levothyroxine (SYNTHROID) 88 MCG tablet Take 88 mcg by mouth daily before breakfast.     [provider]  meclizine  (ANTIVERT) 25 MG tablet Take 25 mg by mouth daily as needed for dizziness. For dizziness    [provider]  Multiple Vitamins-Minerals (PRESERVISION AREDS 2 PO) Take 1 tablet by mouth every evening.     [provider]  nitroGLYCERIN (NITROSTAT) 0.4 MG SL tablet Place 1 tablet (0.4 mg total) under the tongue every 5 (five) minutes as needed for chest pain (max 3 doses). 02/11/20 05/11/20  Hilty, Nadean Corwin, MD  pantoprazole (PROTONIX) 40 MG tablet Take 40 mg by mouth daily. 06/24/20   [provider]  Polyethyl Glycol-Propyl Glycol (SYSTANE OP) Place 1 drop into both eyes 2 (two) times daily.     [provider]  rosuvastatin (CRESTOR) 10 MG tablet Take 10 mg by mouth daily. 03/11/20   [provider]  torsemide (DEMADEX) 20 MG tablet Take 2 tablets (40 mg total) by mouth daily. 08/06/20   Hilty, Nadean Corwin, MD    Allergies    Clonidine derivatives, Sulfa antibiotics, Crestor [rosuvastatin calcium], Epinephrine, Hydralazine, Losartan, and Other  Review of Systems   Review of Systems  Constitutional: Positive for activity change, appetite change and fatigue.  HENT: Negative.   Eyes: Negative.   Respiratory: Negative.   Cardiovascular: Negative.   Gastrointestinal: Positive for abdominal pain and nausea.  Endocrine:       Oliguria  Musculoskeletal: Negative.   Skin: Negative.   Neurological: Positive for weakness.    Physical Exam Updated Vital Signs BP (!) 123/92   Pulse 62   Temp 98.6 F (37 C) (Oral)   Resp 18   Ht 5\' 1"  (1.549 m)   Wt 59 kg   SpO2 99%   BMI 24.56 kg/m   Physical Exam Vitals and nursing note reviewed.  Constitutional:      General: She is not  in acute distress.    Appearance: She is well-developed and well-nourished. She is not diaphoretic.  HENT:     Head: Normocephalic and atraumatic.  Eyes:     General: No scleral icterus.    Conjunctiva/sclera: Conjunctivae normal.  Cardiovascular:     Rate and Rhythm:  Normal rate and regular rhythm.     Heart sounds: Normal heart sounds. No murmur heard. No friction rub. No gallop.   Pulmonary:     Effort: Pulmonary effort is normal. No respiratory distress.     Breath sounds: Normal breath sounds.  Abdominal:     General: Bowel sounds are normal. There is no distension.     Palpations: Abdomen is soft. There is no mass.     Tenderness: There is abdominal tenderness in the right upper quadrant. There is guarding.     Hernia: No hernia is present.  Musculoskeletal:     Cervical back: Normal range of motion.  Skin:    General: Skin is warm and dry.  Neurological:     Mental Status: She is alert and oriented to person, place, and time.  Psychiatric:        Behavior: Behavior normal.     ED Results / Procedures / Treatments   Labs (all labs ordered are listed, but only abnormal results are displayed) Labs Reviewed  LIPASE, BLOOD - Abnormal; Notable for the following components:      Result Value   Lipase 82 (*)    All other components within normal limits  COMPREHENSIVE METABOLIC PANEL - Abnormal; Notable for the following components:   Sodium 133 (*)    Chloride 96 (*)    CO2 21 (*)    Glucose, Bld 296 (*)    BUN 93 (*)    Creatinine, Ser 2.82 (*)    GFR, Estimated 16 (*)    Anion gap 16 (*)    All other components within normal limits  CBC - Abnormal; Notable for the following components:   RBC 3.08 (*)    Hemoglobin 10.5 (*)    HCT 30.2 (*)    MCH 34.1 (*)    All other components within normal limits  TROPONIN I (HIGH SENSITIVITY) - Abnormal; Notable for the following components:   Troponin I (High Sensitivity) 110 (*)    All other components within normal limits  URINALYSIS, ROUTINE W REFLEX MICROSCOPIC    EKG EKG Interpretation  Date/Time:  Friday October 03 2020 13:06:01 EST Ventricular Rate:  61 PR Interval:  244 QRS Duration: 90 QT Interval:  410 QTC Calculation: 412 R Axis:   -11 Text Interpretation: Atrial-paced  rhythm with prolonged AV conduction Cannot rule out Anterior infarct , age undetermined ST & T wave abnormality, consider inferolateral ischemia Abnormal ECG No sig change from July 2020 ecg, lateral T wave inversions noted on prior tracing, no STEMI Confirmed by Octaviano Glow [phone]) on 10/03/2020 1:38:25 PM   Radiology CT ABDOMEN PELVIS WO CONTRAST  Result Date: 10/03/2020 CLINICAL DATA:  Nausea, vomiting, weight loss for several weeks, not improving EXAM: CT ABDOMEN AND PELVIS WITHOUT CONTRAST TECHNIQUE: Multidetector CT imaging of the abdomen and pelvis was performed following the standard protocol without IV contrast. COMPARISON:  05/19/2019 FINDINGS: Lower chest: No acute abnormality. Hepatobiliary: No solid liver abnormality is seen. No gallstones, gallbladder wall thickening, or biliary dilatation. Pancreas: Unremarkable. No pancreatic ductal dilatation or surrounding inflammatory changes. Spleen: Normal in size without significant abnormality. Adrenals/Urinary Tract: Adrenal glands are unremarkable. Atrophic kidneys. Hyperdense hemorrhagic or  proteinaceous cysts of the left kidney, not significantly changed in appearance compared to prior examination. Bladder is unremarkable. Stomach/Bowel: Stomach is within normal limits. Appendix appears normal. No evidence of bowel wall thickening, distention, or inflammatory changes. Sigmoid diverticulosis. Vascular/Lymphatic: Aortic atherosclerosis. No enlarged abdominal or pelvic lymph nodes. Reproductive: Status post hysterectomy. Other: No abdominal wall hernia or abnormality. No abdominopelvic ascites. Minimal soft tissue stranding in the left retroperitoneum at the site of a previously identified hematoma, since resolved. Musculoskeletal: No acute or significant osseous findings. IMPRESSION: 1. No acute noncontrast CT findings of the abdomen or pelvis to explain nausea, vomiting, or weight loss. 2. Minimal soft tissue stranding in the left retroperitoneum  at the site of a previously identified hematoma, since resolved. 3. Sigmoid diverticulosis without evidence of acute diverticulitis. Aortic Atherosclerosis (ICD10-I70.0). Electronically Signed   By: Eddie Candle M.D.   On: 10/03/2020 14:35   DG Chest 1 View  Result Date: 10/03/2020 CLINICAL DATA:  Nausea, abdominal pain EXAM: CHEST  1 VIEW COMPARISON:  05/21/2019 and prior FINDINGS: Left chest wall pacing device. Patchy and confluent left basilar/retrocardiac opacities. No pneumothorax or pleural effusion. Cardiomegaly. Postsurgical appearance of the cardiomediastinal silhouette. Multilevel spondylosis. IMPRESSION: Left basilar/retrocardiac opacities. Differential includes atelectasis, edema or infection. Stable cardiomegaly. Safety pin overlies the right neck soft tissues. Correlate with patient history/physical exam. Electronically Signed   By: Primitivo Gauze M.D.   On: 10/03/2020 14:53    Procedures Procedures (including critical care time)  Medications Ordered in ED Medications  ondansetron (ZOFRAN) injection 4 mg (has no administration in time range)  sodium chloride 0.9 % bolus 500 mL (has no administration in time range)    ED Course  I have reviewed the triage vital signs and the nursing notes.  Pertinent labs & imaging results that were available during my care of the patient were reviewed by me and considered in my medical decision making (see chart for details).  Clinical Course as of 10/03/20 1521  Fri Oct 03, 2020  1450 patient [AH]  19 79 yo female w/ hx of CAD presenting to Ed with nausea, poor appetite, fatigue for many days.  Daughter present at bedside.  Pt lives alone with daughter helping out occasionally.  She apparently has barely been eating or drinking due ot nausea the past several days.  She's on torsemide for CHF and taking this, but not urinating much.  Patient complaints only of cramping lower abdominal pain on exam.  CT without acute findings.  RUQ  ultrasound pending.  Labs show worsening Cr (although baseline CKD) with BUN 93 today, likely prerenal.  Will give 1L IVF here.   Trop 110, will need repeat.  No STEMI on ECG and no chest pain - this may be some demand ischemia, would not initiate IV heparin for NSTEMI unless new sx develop or trop sig elevates.  Inpatient team can consider cardiology consult if needed. [MT]    Clinical Course User Index [AH] Margarita Mail, PA-C [MT] Langston Masker Carola Rhine, MD   MDM Rules/Calculators/A&P                          79 y/o f with extensive cardiac, renal, and GI hx here with abd pain, now resolved, nausea, weight loss, and weakness. Differential diagnosis of epigastric pain includes: Functional or nonulcer dyspepsia, PUD, GERD, Gastritis, (NSAIDs, alcohol, stress, H. pylori, pernicious anemia), pancreatitis or pancreatic cancer, overeating indigestion (high-fat foods, coffee), drugs (aspirin, antibiotics (eg, macrolides, metronidazole),  corticosteroids, digoxin, narcotics, theophylline), gastroparesis, lactose intolerance, malabsorption, gastric cancer, parasitic infection, (Giardia, Strongyloides, Ascaris) cholelithiasis, choledocholithiasis, or cholangitis, ACS, pericarditis, pneumonia, abdominal hernia, pregnancy, intestinal ischemia, esophageal rupture, gastric volvulus, hepatitis.  I ordered and reviewed patient's labs which include a CBC with stable normocytic anemia, CMP with chronic renal insufficiency slightly worse today with significantly elevated BUN of 93, anion gap of 16.  Bicarb of 21.  Patient does have an elevated troponin level of 110 at this time.  This may be secondary to some cardiac demand ischemia versus worsening renal function or ACS.  Patient's urine is currently pending.  I ordered and reviewed a noncontrast CT scan as the patient has poor renal function.  There are no acute intra-abdominal findings.  Patient's EKG shows an atrial paced rhythm at a rate of 61 without any significant  changes from previous tracings. Patient's 1 view chest x-ray shows left basilar infiltrates which are nonspecific at this time. Patient has an ultrasound of the right upper quadrant pending to rule out biliary cause. Sign out given to Dr. Lurena Nida.   Final Clinical Impression(s) / ED Diagnoses Final diagnoses:  Nausea    Rx / DC Orders ED Discharge Orders    None       Margarita Mail, PA-C 10/03/20 1522    Wyvonnia Dusky, MD 10/03/20 [phone]

## 2020-10-03 NOTE — ED Notes (Signed)
Dinner Tray Ordered @ 1744. 

## 2020-10-03 NOTE — ED Provider Notes (Signed)
Patient care assumed from Margarita Mail, PA-C at 3:15PM  PMHx DM, cardiac disease, renal disease. P/w 1 month epigastric abdominal pain, stopped taking meds and then got thrush (treated). 1 week nausea, 7lb weight loss. Has seen PCP. Weak, decrease PO, falls. RUQ tenderness and guarding. Noncon CT unremarkable. EKG with TWI not new. No CP.  Plna: -Admit to FTT, dehydration, troponin -F/u RUQ U/S   Physical Exam  BP (!) 123/92   Pulse 62   Temp 98.6 F (37 C) (Oral)   Resp 18   Ht 5\' 1"  (1.549 m)   Wt 59 kg   SpO2 99%   BMI 24.56 kg/m   Physical Exam Constitutional:      General: She is not in acute distress.    Appearance: She is well-developed.  Abdominal:     Tenderness: There is no abdominal tenderness.  Neurological:     Mental Status: She is alert.     ED Course/Procedures   Clinical Course as of 10/03/20 1632  Fri Oct 03, 2020  1450 patient [AH]  8 79 yo female w/ hx of CAD presenting to Ed with nausea, poor appetite, fatigue for many days.  Daughter present at bedside.  Pt lives alone with daughter helping out occasionally.  She apparently has barely been eating or drinking due ot nausea the past several days.  She's on torsemide for CHF and taking this, but not urinating much.  Patient complaints only of cramping lower abdominal pain on exam.  CT without acute findings.  RUQ ultrasound pending.  Labs show worsening Cr (although baseline CKD) with BUN 93 today, likely prerenal.  Will give 1L IVF here.   Trop 110, will need repeat.  No STEMI on ECG and no chest pain - this may be some demand ischemia, would not initiate IV heparin for NSTEMI unless new sx develop or trop sig elevates.  Inpatient team can consider cardiology consult if needed. [MT]    Clinical Course User Index [AH] Margarita Mail, PA-C [MT] Langston Masker Carola Rhine, MD    Procedures  MDM  On assessment, patient well-appearing, daughter at bedside.  Discussed results of CT scan, right upper quadrant  ultrasound, and lab work thus far.  Recommended admission, patient and daughter amenable this plan.  4:30 PM -spoke with Dr. Acie Fredrickson with cardiology regarding the patient's troponin of 110.  No delta troponin resulted yet.  Patient not having any chest pain or shortness of breath.  Per cards, felt that this was likely related to acute on chronic kidney injury/elevation in creatinine rather than ACS.  No further cardiac intervention at this time although can reconsult if necessary.  Pacemaker interrogation pending at time of admission.  Handoff given to hospitalist, Dr. Tamala Julian.  Admitted in stable condition.            Darrick Huntsman, MD 10/03/20 Mancel Bale    Noemi Chapel, MD 10/06/20 [phone]

## 2020-10-03 NOTE — Progress Notes (Signed)
Cardiology Office Note   Date:  10/06/2020   ID:  [name], DOB 29-Feb-1941, MRN [id number]  PCP:  Leeroy Cha, MD  Cardiologist:  [name], MD EP: Will Meredith Leeds, MD  Chief Complaint  Patient presents with  . Follow-up    CAD, CHF      History of Present Illness: [name] is a 79 y.o. female with PMH of CAD s/p CABG in 2013 with LIMA to LAD, SVG to OM, and SVG to RCA with subsequent PCI/DES to SVG to OM in 7858, chronic diastolic CHF, paroxysmal atrial flutter, HTN, HLD, DM type 2, SSS s/p PPM, and CKD stage 3, who presents for routine follow-up.  She saw Dr. Debara Pickett 01/2020 in follow-up of recent progressive DOE. She underwent a NST which showed a moderate sized partially reversible perfusion defect possibly 2/2 attenuation artifact with EF 47%- on Dr. Lysbeth Penner review there appeared to be less ischemia than previous study. She reported some improvement in her symptoms. Decision made to defer further ischemic testing as the risks benefits ratio was not favorable with moderate CKD. She was recommended to follow-up in 6 months. She was seen by Dr. Curt Bears 03/2020 at which time she was doing well without cardiac complaints. Her last device interrogation 08/19/20 showed normal device function. Her last echo 04/2019 showed EF 55-60%, G2DD, no RWMA, mildly reduced RV function , moderate LAE, and mild-moderate MR.  She presents today for routine follow-up. She is accompanied by her daughter. She was admitted to the hospital 10/03/20-10/05/20 after presenting with nausea and weakness. Cr was elevated to 2.82, felt to be 2/2 dehydration. She was recently recommended to decreased her torsemide to 20mg  daily alternating with 40mg  daily given recent weight loss (change made 10/01/20. Torsemide was held and she was given gentle IVFs and symptoms improved. She was instructed to resume her torsemide as previously recommended. She continues to have occasional chest tightness  when she is doing more strenuous activities but denies chest pain at rest. No complaints of SOB, DOE, LE edema, orthopnea, PND, weight gain, dizziness, lightheadedness, syncope, or palpitations.     Past Medical History:  Diagnosis Date  . Anemia   . Anxiety   . Arthritis    "in my hands; knees, back" (09/27/2018)  . CAD (coronary artery disease)    a. 40-59% bilaterally 10/2015.  Marland Kitchen Chronic diastolic CHF (congestive heart failure) ([place])    a. 05/2016 Echo: EF 60-65%, no rwma, Gr1 DD, Ao sclerosis w/o stenosis, triv MR;  b. 07/2016 TEE: EF 55-60%, no rwma, mild MR.  . CKD (chronic kidney disease), stage III ([place])   . Coronary artery disease    a. 02/2007 Persantine MV: low risk;  b. 11/2011 CABG x 3 (LIMA->LAD, VG->OM, VG->RCA);  c. 05/2016 MV: EF >65%, no isch/infarct, horiz ST dep in I, II, V5-V6.  Marland Kitchen Depression   . Diverticulosis   . Esophageal stricture   . GERD (gastroesophageal reflux disease)   . Hemorrhoids   . Hiatal hernia   . Hyperkalemia    a. ARB stopped due to this.  . Hyperlipidemia   . Hypertension   . Hypertensive heart disease   . Hypothyroidism   . Major depressive disorder with anxious distress 09/05/2019  . Mild cognitive impairment    a. seen by neurology.  . Mild vascular neurocognitive disorder ([place]) 09/05/2019  . Myocardial infarction ([place]) 12/07/2011  . PAF (paroxysmal atrial fibrillation) ([facility])    a. post-op CABG.  Marland Kitchen  Pain    RIGHT KNEE PAIN - TORN RIGHT MEDIAL MENISCUS  . Paroxysmal atrial flutter ([place])    a. 07/2016 s/p TEE & DCCV;  b. 07/2016 Recurrent PAFlutter req initiation of amio & PPM in setting of tachy-brady;  c. CHA2DS2VASc = 7-->Xarelto 15 mg QD.  Marland Kitchen Pneumonia    "twice" (09/27/2018)  . PONV (postoperative nausea and vomiting)   . Presence of permanent cardiac pacemaker 08/12/2016  . S/P CABG (coronary artery bypass graft), 12/04/11 12/07/2011   LIMA to LAD, SVG to OM, SVG to RCA  . Sinus bradycardia    a. not on BB due to this.  . Skin  cancer    "face" (09/27/2018)  . Tachy-brady syndrome ([place])    a. 07/2016 Jxnl brady following DCCV, recurrent Aflutter-->amio + SJM 2272 Assurity MRI DC PPM (ser # [id number]).  . Type II diabetes mellitus ([place])     Past Surgical History:  Procedure Laterality Date  . ABDOMINAL HYSTERECTOMY  1980's  . ANKLE FRACTURE SURGERY Right    "put pins both side right ankle"  . BACK SURGERY  2006   "cyst growing near my spine"  . CARDIAC CATHETERIZATION  12/02/2011   mild LV dysfunction with mod hypocontractility of mid-distal anterolateral wall; CAD w/ostial tapering of L [place] with 50% diffuse ostial narrowing of LAD, 99% eccentric focal prox LAD stenosis followed by 70% prox LAD stenosis after 1st diag, 20% mid LAD narrowing; 80% ostial-to-prox L Cfx stenosis & 40-50% irregularity of RCA (Dr. Corky Downs)  . CARDIOVERSION N/A 08/11/2016   Procedure: CARDIOVERSION;  Surgeon: Lelon Perla, MD;  Location[facility] ENDOSCOPY;  Service: Cardiovascular;  Laterality: N/A;  . CATARACT EXTRACTION W/ INTRAOCULAR LENS  IMPLANT, BILATERAL Bilateral ~ 2010  . [place]  . CORONARY ARTERY BYPASS GRAFT  12/04/2011   Procedure: CORONARY ARTERY BYPASS GRAFTING (CABG);  Surgeon: Tharon Aquas Adelene Idler, MD;  Location: [place] [facility];  Service: Open Heart Surgery;  Laterality: N/A;  CABG x three,  using left internal mammary artery, and right leg greater saphenous vein harvested endoscopically  . CORONARY STENT INTERVENTION N/A 09/27/2018   Procedure: CORONARY STENT INTERVENTION;  Surgeon: Troy Sine, MD;  Location: [place] CV LAB;  Service: Cardiovascular;  Laterality: N/A;  . DILATION AND CURETTAGE OF UTERUS     "a couple times"  . EP IMPLANTABLE DEVICE N/A 08/12/2016   Procedure: Pacemaker Implant;  Surgeon: Will Meredith Leeds, MD;  Location: [place] CV LAB;  Service: Cardiovascular;  Laterality: N/A;  . ESOPHAGOGASTRODUODENOSCOPY (EGD) WITH ESOPHAGEAL DILATION    . FRACTURE SURGERY    . JOINT REPLACEMENT     . KNEE ARTHROSCOPY WITH MEDIAL MENISECTOMY Right 07/02/2014   Procedure: RIGHT KNEE ARTHROSCOPY WITH PARTIAL MEDIAL MENISTECTOMY, ABRASION CONDROPLASTYU OF PATELLA,ABRASION CONDROPLASTY OF MEDIAL FEMEROL CONDYL, MICROFRACTURE OF MEDIAL FEMEROL CONDYL;  Surgeon: Tobi Bastos, MD;  Location: [facility] ORS;  Service: Orthopedics;  Laterality: Right;  . LEFT HEART CATH AND CORS/GRAFTS ANGIOGRAPHY N/A 09/06/2018   Procedure: LEFT HEART CATH AND CORS/GRAFTS ANGIOGRAPHY;  Surgeon: Troy Sine, MD;  Location: [place] CV LAB;  Service: Cardiovascular;  Laterality: N/A;  . LEFT HEART CATHETERIZATION WITH CORONARY ANGIOGRAM N/A 12/02/2011   Procedure: LEFT HEART CATHETERIZATION WITH CORONARY ANGIOGRAM;  Surgeon: Troy Sine, MD;  Location: [facility] CATH LAB;  Service: Cardiovascular;  Laterality: N/A;  Coronary angiogram, possible PCI  . TEE WITHOUT CARDIOVERSION N/A 08/11/2016   Procedure: TRANSESOPHAGEAL ECHOCARDIOGRAM (TEE);  Surgeon: Lelon Perla, MD;  Location: [facility] ENDOSCOPY;  Service: Cardiovascular;  Laterality: N/A;  . TEE WITHOUT CARDIOVERSION N/A 05/18/2019   Procedure: TRANSESOPHAGEAL ECHOCARDIOGRAM (TEE);  Surgeon: [name], MD;  Location: [place];  Service: Cardiovascular;  Laterality: N/A;  . TONSILLECTOMY  1949  . TOTAL KNEE ARTHROPLASTY Left ~ 2006  . TRANSTHORACIC ECHOCARDIOGRAM  02/19/2013   EF 77-41%, grade 1 diastolic dysfunction; mildly thickend/calcified AV leaflets; mildly calcidied MV annulus; mild TR     Current Outpatient Medications  Medication Sig Dispense Refill  . acetaminophen (TYLENOL) 650 MG CR tablet Take 650 mg by mouth every 8 (eight) hours as needed for pain.    Marland Kitchen allopurinol (ZYLOPRIM) 100 MG tablet Take 100 mg by mouth daily.    . carvedilol (COREG) 25 MG tablet Take 1 tablet (25 mg total) by mouth 2 (two) times daily. 60 tablet 11  . diclofenac Sodium (VOLTAREN) 1 % GEL Apply 2 g topically 2 (two) times daily as needed (pain).    Marland Kitchen docusate sodium  (COLACE) 100 MG capsule Take 100 mg by mouth 2 (two) times daily as needed for mild constipation or moderate constipation.    . ferrous sulfate 325 (65 FE) MG EC tablet Take 325 mg by mouth daily with breakfast.    . gabapentin (NEURONTIN) 300 MG capsule Take 1 capsule (300 mg total) by mouth at bedtime. 30 capsule 2  . LANTUS SOLOSTAR 100 UNIT/ML Solostar Pen Inject 22 Units into the skin daily.  6  . levothyroxine (SYNTHROID) 88 MCG tablet Take 88 mcg by mouth daily before breakfast.    . meclizine (ANTIVERT) 25 MG tablet Take 25 mg by mouth daily as needed for dizziness. For dizziness    . Multiple Vitamins-Minerals (PRESERVISION AREDS 2 PO) Take 1 tablet by mouth every evening.     . pantoprazole (PROTONIX) 40 MG tablet Take 40 mg by mouth daily.    Vladimir Faster Glycol-Propyl Glycol (SYSTANE OP) Place 1 drop into both eyes 2 (two) times daily as needed (dry eyes).    . rosuvastatin (CRESTOR) 10 MG tablet Take 10 mg by mouth daily.    Marland Kitchen torsemide (DEMADEX) 20 MG tablet Take 2 tablets (40 mg total) by mouth daily. 180 tablet 0  . nitroGLYCERIN (NITROSTAT) 0.4 MG SL tablet Place 1 tablet (0.4 mg total) under the tongue every 5 (five) minutes as needed for chest pain (max 3 doses). 25 tablet 3   No current facility-administered medications for this visit.    Allergies:   Clonidine derivatives, Sulfa antibiotics, Crestor [rosuvastatin calcium], Epinephrine, Hydralazine, Losartan, and Other    Social History:  The patient  reports that she has never smoked. She has never used smokeless tobacco. She reports previous alcohol use. She reports that she does not use drugs.   Family History:  The patient's family history includes Breast cancer in her sister and sister; CVA in her mother; Diabetes in her mother and sister; Heart disease in her brother, father, and sister; Hyperlipidemia in her father; Hypertension in her mother; Lung cancer in her sister.    ROS:  Please see the history of present  illness.   Otherwise, review of systems are positive for none.   All other systems are reviewed and negative.    PHYSICAL EXAM: VS:  BP (!) 106/52   Pulse 63   Ht 5\' 1"  (1.549 m)   Wt 133 lb 9.6 oz (60.6 kg)   SpO2 98%   BMI 25.24 kg/m  , BMI Body mass index is  25.24 kg/m. GEN: Well nourished, well developed, in no acute distress HEENT: sclera anicteric Neck: no JVD, carotid bruits, or masses Cardiac: RRR; no murmurs, rubs, or gallops, no edema  Respiratory:  clear to auscultation bilaterally, normal work of breathing GI: soft, nontender, nondistended, + BS MS: no deformity or atrophy Skin: warm and dry, no rash Neuro:  HOH, Strength and sensation are intact Psych: euthymic mood, full affect   EKG:  EKG is not ordered today.   Recent Labs: 01/04/2020: BNP 619.4 10/04/2020: TSH 0.237 10/05/2020: ALT 19; BUN 62; Creatinine, Ser 2.33; Hemoglobin 8.8; Magnesium 2.0; Platelets 148; Potassium 4.2; Sodium 142    Lipid Panel    Component Value Date/Time   CHOL 203 (H) 09/28/2018 0437   TRIG 320 (H) 09/28/2018 0437   HDL 25 (L) 09/28/2018 0437   CHOLHDL 8.1 09/28/2018 0437   VLDL 64 (H) 09/28/2018 0437   LDLCALC 114 (H) 09/28/2018 0437      Wt Readings from Last 3 Encounters:  10/06/20 133 lb 9.6 oz (60.6 kg)  10/05/20 131 lb 11.2 oz (59.7 kg)  04/03/20 136 lb 9.6 oz (62 kg)      Other studies Reviewed: Additional studies/ records that were reviewed today include:   Echocardiogram 04/2019:  1. The left ventricle has normal systolic function, with an ejection  fraction of 55-60%. The cavity size was normal. Left ventricular diastolic  Doppler parameters are consistent with pseudonormalization. Elevated mean  left atrial pressure There is  abnormal septal motion consistent with post-operative status. No evidence  of left ventricular regional wall motion abnormalities.  2. The right ventricle has mildly reduced systolic function. The cavity  was normal. There is no  increase in right ventricular wall thickness.  Right ventricular systolic pressure is mildly elevated with an estimated  pressure of 44.0 mmHg.  3. Left atrial size was moderately dilated.  4. Mild thickening of the mitral valve leaflet. The MR jet is  centrally-directed.  5. Tricuspid valve regurgitation is mild-moderate.  6. The aorta is normal in size and structure.   NST 12/2019:  Nuclear stress EF: 47%.  The left ventricular ejection fraction is mildly decreased (45-54%).  There was no ST segment deviation noted during stress.  There is a medium defect of moderate severity present in the apical inferior, apical lateral and apex location. The defect is partially reversible. This is consistent with infarct with minimal peri infarct ischemia but also could represent variations in diaphragmatic attenuation artifact.  This is a low risk study.    ASSESSMENT AND PLAN:  1. CAD s/p CABG in 2013 with subsequent PCI/DES to SVG-OM in 2019: no anginal complaints. Previously not on aspirin due to need for xarelto, however this was stopped 04/2019 following GI bleed. Angina appears stable. She is a poor candidate for invasive evaluation with ongoing issues with CKD.  - Continue statin - Continue BBlocker  2. Chronic diastolic CHF: Cr elevated to 2.82 10/03/20 and torsemide held during recent hospitalization. She restarted torsemide 20mg  daily alternating with 40mg  daily per her nephrologist. Predominately experiences abdominal bloating when volume overloaded, though this has been stable recently. She appears euvolemic on exam. She plans to f/u with nephrology in 1 week per discharge recommendations - Will continue torsemide as prescribed.  - Continue BBlocker  3. HTN: BP somewhat labile on review of home log. BP 106/52 today.  - Continue carvedilol and lasix  4. CKD stage 4: Cr peaked at 2.82 during recent admission, improved to 2.33 after IVF's.  Encouraged to follow-up with nephrology 1  week after discharge - Continue close monitoring  5. Paroxysmal atrial flutter: no recurrence on PPM monitoring. HR regular on exam today. No longer on anticoagulation given hx of GI bleed - Continue carvedilol for rate control - Continue to monitor for recurrent arrhythmias via PPM monitoring  6. SSS s/p PPM placement: normal device function on recent monitoring - Continue routine monitoring per Dr. Curt Bears.    Current medicines are reviewed at length with the patient today.  The patient does not have concerns regarding medicines.  The following changes have been made:  As above  Labs/ tests ordered today include:  No orders of the defined types were placed in this encounter.    Disposition:   FU with Dr. Debara Pickett in 6 months  Signed, Abigail Butts, PA-C  10/06/2020 11:11 AM

## 2020-10-03 NOTE — ED Notes (Signed)
ST jude called for rep to interrogate pacemaker.

## 2020-10-03 NOTE — ED Triage Notes (Signed)
Pt c/o increase abd pain and nausea for x weeks not getting better.

## 2020-10-03 NOTE — H&P (Addendum)
History and Physical    [name] JKD:[id number] DOB: December 20, 1940 DOA: 10/03/2020  Referring MD/NP/PA: Eben Burow) PCP: Leeroy Cha, MD  Consultants: Rolan Lipa, MD nephrology Dr. Buddy Duty endocrinology  Patient coming from: Home  Chief Complaint: Stomach pain  I have personally briefly reviewed patient's old medical records in [place]   HPI: [name] is a 79 y.o. female with medical history significant of chronic diastolic CHF, A. fib on Xarelto, CAD, SSS s/p PM, CKD stage IV, diabetes mellitus type 2, chronic anemia, GERD with esophagitis and hypothyroidism presents with complaints of abdominal pain over the last 3-4 weeks.  She complains of epigastric and midline pain that she states was worsened with eating.  Patient tried not eating which seem to help symptoms some.  Noted associated symptoms of constant nausea with intermittent vomiting, 7 pound weight loss, and generalized weakness.  Denies trying anything at home.  Patient is prescribed Protonix, but with  her symptoms had not been taking all of her home medications. She had followed up with her nephrologist Dr. Moshe Cipro and was told to decrease her torsemide dose last week.  Prior to these symptoms she had developed thrush, but was given medication with resolution of sore throat symptoms.  Lastly patient reports that she previously required esophageal dilation back in 2008 with Dr. Henrene Pastor, and had last EGD in 2012 which revealed signs of esophagitis.  She has not been seen by gastroenterology in several years.  ED Course: Upon admission into the emergency department patient was seen to be afebrile with vital signs relatively stable.  Labs significant for hemoglobin 10.5, sodium 133, BUN 93, creatinine 2.82, glucose 296, and lipase 82.  CT scan abdomen pelvis showed no acute abnormalities.  Right upper quadrant ultrasound also did not show any acute findings.  Patient having given 500 mL  of normal saline IV fluids and Zofran.  TRH called to admit.  Influencing COVID-19 screening were still pending  Review of Systems  Constitutional: Positive for malaise/fatigue. Negative for fever.  HENT: Negative for congestion, ear discharge and sore throat.   Eyes: Negative for photophobia and pain.  Respiratory: Negative for cough and shortness of breath.   Cardiovascular: Negative for chest pain and leg swelling.  Gastrointestinal: Positive for abdominal pain, nausea and vomiting. Negative for constipation.  Genitourinary: Negative for dysuria and hematuria.  Musculoskeletal: Negative for falls.  Skin: Negative for rash.  Neurological: Positive for weakness. Negative for focal weakness and loss of consciousness.  Psychiatric/Behavioral: Negative for memory loss and substance abuse.    Past Medical History:  Diagnosis Date  . Anemia   . Anxiety   . Arthritis    "in my hands; knees, back" (09/27/2018)  . CAD (coronary artery disease)    a. 40-59% bilaterally 10/2015.  Marland Kitchen Chronic diastolic CHF (congestive heart failure) ([place])    a. 05/2016 Echo: EF 60-65%, no rwma, Gr1 DD, Ao sclerosis w/o stenosis, triv MR;  b. 07/2016 TEE: EF 55-60%, no rwma, mild MR.  . CKD (chronic kidney disease), stage III ([place])   . Coronary artery disease    a. 02/2007 Persantine MV: low risk;  b. 11/2011 CABG x 3 (LIMA->LAD, VG->OM, VG->RCA);  c. 05/2016 MV: EF >65%, no isch/infarct, horiz ST dep in I, II, V5-V6.  Marland Kitchen Depression   . Diverticulosis   . Esophageal stricture   . GERD (gastroesophageal reflux disease)   . Hemorrhoids   . Hiatal hernia   . Hyperkalemia    a. ARB  stopped due to this.  . Hyperlipidemia   . Hypertension   . Hypertensive heart disease   . Hypothyroidism   . Major depressive disorder with anxious distress 09/05/2019  . Mild cognitive impairment    a. seen by neurology.  . Mild vascular neurocognitive disorder ([place]) 09/05/2019  . Myocardial infarction ([place]) 12/07/2011  . PAF  (paroxysmal atrial fibrillation) ([facility])    a. post-op CABG.  . Pain    RIGHT KNEE PAIN - TORN RIGHT MEDIAL MENISCUS  . Paroxysmal atrial flutter ([place])    a. 07/2016 s/p TEE & DCCV;  b. 07/2016 Recurrent PAFlutter req initiation of amio & PPM in setting of tachy-brady;  c. CHA2DS2VASc = 7-->Xarelto 15 mg QD.  Marland Kitchen Pneumonia    "twice" (09/27/2018)  . PONV (postoperative nausea and vomiting)   . Presence of permanent cardiac pacemaker 08/12/2016  . S/P CABG (coronary artery bypass graft), 12/04/11 12/07/2011   LIMA to LAD, SVG to OM, SVG to RCA  . Sinus bradycardia    a. not on BB due to this.  . Skin cancer    "face" (09/27/2018)  . Tachy-brady syndrome ([place])    a. 07/2016 Jxnl brady following DCCV, recurrent Aflutter-->amio + SJM 2272 Assurity MRI DC PPM (ser # [id number]).  . Type II diabetes mellitus ([place])     Past Surgical History:  Procedure Laterality Date  . ABDOMINAL HYSTERECTOMY  1980's  . ANKLE FRACTURE SURGERY Right    "put pins both side right ankle"  . BACK SURGERY  2006   "cyst growing near my spine"  . CARDIAC CATHETERIZATION  12/02/2011   mild LV dysfunction with mod hypocontractility of mid-distal anterolateral wall; CAD w/ostial tapering of L Main with 50% diffuse ostial narrowing of LAD, 99% eccentric focal prox LAD stenosis followed by 70% prox LAD stenosis after 1st diag, 20% mid LAD narrowing; 80% ostial-to-prox L Cfx stenosis & 40-50% irregularity of RCA (Dr. Corky Downs)  . CARDIOVERSION N/A 08/11/2016   Procedure: CARDIOVERSION;  Surgeon: Lelon Perla, MD;  Location: [facility] ENDOSCOPY;  Service: Cardiovascular;  Laterality: N/A;  . CATARACT EXTRACTION W/ INTRAOCULAR LENS  IMPLANT, BILATERAL Bilateral ~ 2010  . [place]  . CORONARY ARTERY BYPASS GRAFT  12/04/2011   Procedure: CORONARY ARTERY BYPASS GRAFTING (CABG);  Surgeon: Tharon Aquas Adelene Idler, MD;  Location: [facility];  Service: Open Heart Surgery;  Laterality: N/A;  CABG x three,  using left internal mammary  artery, and right leg greater saphenous vein harvested endoscopically  . CORONARY STENT INTERVENTION N/A 09/27/2018   Procedure: CORONARY STENT INTERVENTION;  Surgeon: Troy Sine, MD;  Location: [place] CV LAB;  Service: Cardiovascular;  Laterality: N/A;  . DILATION AND CURETTAGE OF UTERUS     "a couple times"  . EP IMPLANTABLE DEVICE N/A 08/12/2016   Procedure: Pacemaker Implant;  Surgeon: Will Meredith Leeds, MD;  Location: [place] CV LAB;  Service: Cardiovascular;  Laterality: N/A;  . ESOPHAGOGASTRODUODENOSCOPY (EGD) WITH ESOPHAGEAL DILATION    . FRACTURE SURGERY    . JOINT REPLACEMENT    . KNEE ARTHROSCOPY WITH MEDIAL MENISECTOMY Right 07/02/2014   Procedure: RIGHT KNEE ARTHROSCOPY WITH PARTIAL MEDIAL MENISTECTOMY, ABRASION CONDROPLASTYU OF PATELLA,ABRASION CONDROPLASTY OF MEDIAL FEMEROL CONDYL, MICROFRACTURE OF MEDIAL FEMEROL CONDYL;  Surgeon: Tobi Bastos, MD;  Location: [facility] ORS;  Service: Orthopedics;  Laterality: Right;  . LEFT HEART CATH AND CORS/GRAFTS ANGIOGRAPHY N/A 09/06/2018   Procedure: LEFT HEART CATH AND CORS/GRAFTS ANGIOGRAPHY;  Surgeon: Troy Sine, MD;  Location: [place] CV LAB;  Service: Cardiovascular;  Laterality: N/A;  . LEFT HEART CATHETERIZATION WITH CORONARY ANGIOGRAM N/A 12/02/2011   Procedure: LEFT HEART CATHETERIZATION WITH CORONARY ANGIOGRAM;  Surgeon: Troy Sine, MD;  Location[facility] CATH LAB;  Service: Cardiovascular;  Laterality: N/A;  Coronary angiogram, possible PCI  . TEE WITHOUT CARDIOVERSION N/A 08/11/2016   Procedure: TRANSESOPHAGEAL ECHOCARDIOGRAM (TEE);  Surgeon: Lelon Perla, MD;  Location[facility] ENDOSCOPY;  Service: Cardiovascular;  Laterality: N/A;  . TEE WITHOUT CARDIOVERSION N/A 05/18/2019   Procedure: TRANSESOPHAGEAL ECHOCARDIOGRAM (TEE);  Surgeon: [name], MD;  Location: [facility];  Service: Cardiovascular;  Laterality: N/A;  . TONSILLECTOMY  1949  . TOTAL KNEE ARTHROPLASTY Left ~ 2006  . TRANSTHORACIC  ECHOCARDIOGRAM  02/19/2013   EF 02-58%, grade 1 diastolic dysfunction; mildly thickend/calcified AV leaflets; mildly calcidied MV annulus; mild TR     reports that she has never smoked. She has never used smokeless tobacco. She reports previous alcohol use. She reports that she does not use drugs.  Allergies  Allergen Reactions  . Clonidine Derivatives Other (See Comments)    Bradycardia and fatigue   . Sulfa Antibiotics Other (See Comments)    Unknown  . Crestor [Rosuvastatin Calcium] Other (See Comments)    Other reaction(s): tired and weak  . Epinephrine Other (See Comments)    Abnormal feeling. Dental exam/injection of local w/ epi.  Marland Kitchen Hydralazine Other (See Comments)    Nausea/gi upset   . Losartan Other (See Comments)    Hyperkalemia   . Other Other (See Comments)    MANGO'S - WELTS ALL OVER    Family History  Problem Relation Age of Onset  . Diabetes Mother   . CVA Mother   . Hypertension Mother   . Heart disease Father   . Hyperlipidemia Father   . Breast cancer Sister        x 3  . Heart disease Brother        x5; one with MI  . Heart disease Sister        x3  . Diabetes Sister        x3  . Lung cancer Sister   . Breast cancer Sister        x2  . Colon cancer Neg Hx     Prior to Admission medications   Medication Sig Start Date End Date Taking? Authorizing Provider  acetaminophen (TYLENOL) 650 MG CR tablet Take 650 mg by mouth every 8 (eight) hours as needed for pain.   Yes [provider]  allopurinol (ZYLOPRIM) 100 MG tablet Take 100 mg by mouth daily.   Yes [provider]  carvedilol (COREG) 25 MG tablet Take 1 tablet (25 mg total) by mouth 2 (two) times daily. 10/10/19  Yes Hilty, Nadean Corwin, MD  diclofenac Sodium (VOLTAREN) 1 % GEL Apply 2 g topically 2 (two) times daily as needed (pain).   Yes [provider]  docusate sodium (COLACE) 100 MG capsule Take 100 mg by mouth 2 (two) times daily as needed for mild constipation  or moderate constipation.   Yes [provider]  ferrous sulfate 325 (65 FE) MG EC tablet Take 325 mg by mouth daily with breakfast.   Yes [provider]  gabapentin (NEURONTIN) 300 MG capsule Take 1 capsule (300 mg total) by mouth at bedtime. 05/22/19  Yes Roney Jaffe, MD  LANTUS SOLOSTAR 100 UNIT/ML Solostar Pen Inject 22 Units into the skin daily. 08/08/18  Yes [provider]  levothyroxine (SYNTHROID) 88 MCG tablet Take 88 mcg by mouth daily before breakfast. 02/07/20  Yes [provider]  meclizine (ANTIVERT) 25 MG tablet Take 25 mg by mouth daily as needed for dizziness. For dizziness   Yes [provider]  Multiple Vitamins-Minerals (PRESERVISION AREDS 2 PO) Take 1 tablet by mouth every evening.    Yes [provider]  nitroGLYCERIN (NITROSTAT) 0.4 MG SL tablet Place 1 tablet (0.4 mg total) under the tongue every 5 (five) minutes as needed for chest pain (max 3 doses). 02/11/20 05/11/20 Yes Hilty, Nadean Corwin, MD  pantoprazole (PROTONIX) 40 MG tablet Take 40 mg by mouth daily. 06/24/20  Yes [provider]  Polyethyl Glycol-Propyl Glycol (SYSTANE OP) Place 1 drop into both eyes 2 (two) times daily as needed (dry eyes).   Yes [provider]  rosuvastatin (CRESTOR) 10 MG tablet Take 10 mg by mouth daily. 03/11/20  Yes [provider]  torsemide (DEMADEX) 20 MG tablet Take 2 tablets (40 mg total) by mouth daily. 08/06/20  Yes [name], MD    Physical Exam:  Constitutional: Elderly female who appears to be in no acute distress Vitals:   10/03/20 1303 10/03/20 1600  BP: (!) 123/92 (!) 127/47  Pulse: 62 64  Resp: 18 17  Temp: 98.6 F (37 C)   TempSrc: Oral   SpO2: 99% 100%  Weight: 59 kg   Height: 5\' 1"  (1.549 m)    Eyes: PERRL, lids and conjunctivae normal ENMT: Mucous membranes are moist. Posterior pharynx clear of any exudate or lesions.  Dentures present no signs of thrush. Neck: normal,  supple, no masses, no thyromegaly Respiratory: clear to auscultation bilaterally, no wheezing, no crackles. Normal respiratory effort. No accessory muscle use.  Cardiovascular: Regular rate and rhythm, no murmurs / rubs / gallops. No extremity edema. 2+ pedal pulses. No carotid bruits.  Abdomen: Tenderness to palpation appreciated epigastrically.  Bowel sounds present. Musculoskeletal: no clubbing / cyanosis. No joint deformity upper and lower extremities.  Skin: no rashes, lesions, ulcers. No induration Neurologic: CN 2-12 grossly intact. Able to move all extremities.  Psychiatric: Normal judgment and insight. Alert and oriented x 3. Normal mood.     Labs on Admission: I have personally reviewed following labs and imaging studies  CBC: Recent Labs  Lab 10/03/20 1310  WBC 8.0  HGB 10.5*  HCT 30.2*  MCV 98.1  PLT 222   Basic Metabolic Panel: Recent Labs  Lab 10/03/20 1310  NA 133*  K 4.0  CL 96*  CO2 21*  GLUCOSE 296*  BUN 93*  CREATININE 2.82*  CALCIUM 9.1   GFR: Estimated Creatinine Clearance: 13.1 mL/min (A) (by C-G formula based on SCr of 2.82 mg/dL (H)). Liver Function Tests: Recent Labs  Lab 10/03/20 1310  AST 26  ALT 17  ALKPHOS 75  BILITOT 0.7  PROT 6.9  ALBUMIN 3.5   Recent Labs  Lab 10/03/20 1310  LIPASE 82*   No results for input(s): AMMONIA in the last 168 hours. Coagulation Profile: No results for input(s): INR, PROTIME in the last 168 hours. Cardiac Enzymes: No results for input(s): CKTOTAL, CKMB, CKMBINDEX, TROPONINI in the last 168 hours. BNP (last 3 results) No results for input(s): PROBNP in the last 8760 hours. HbA1C: No results for input(s): HGBA1C in the last 72 hours. CBG: No results for input(s): GLUCAP in the last 168 hours. Lipid Profile: No results for input(s): CHOL, HDL, LDLCALC, TRIG, CHOLHDL, LDLDIRECT in the last  72 hours. Thyroid Function Tests: No results for input(s): TSH, T4TOTAL, FREET4, T3FREE, THYROIDAB in the  last 72 hours. Anemia Panel: No results for input(s): VITAMINB12, FOLATE, FERRITIN, TIBC, IRON, RETICCTPCT in the last 72 hours. Urine analysis:    Component Value Date/Time   COLORURINE STRAW (A) 05/14/2019 2052   APPEARANCEUR CLEAR 05/14/2019 2052   LABSPEC 1.008 05/14/2019 2052   PHURINE 5.0 05/14/2019 2052   GLUCOSEU NEGATIVE 05/14/2019 2052   HGBUR NEGATIVE 05/14/2019 2052   BILIRUBINUR NEGATIVE 05/14/2019 2052   KETONESUR NEGATIVE 05/14/2019 2052   PROTEINUR NEGATIVE 05/14/2019 2052   UROBILINOGEN 0.2 01/04/2012 1105   NITRITE NEGATIVE 05/14/2019 2052   LEUKOCYTESUR NEGATIVE 05/14/2019 2052   Sepsis Labs: No results found for this or any previous visit (from the past 240 hour(s)).   Radiological Exams on Admission: CT ABDOMEN PELVIS WO CONTRAST  Result Date: 10/03/2020 CLINICAL DATA:  Nausea, vomiting, weight loss for several weeks, not improving EXAM: CT ABDOMEN AND PELVIS WITHOUT CONTRAST TECHNIQUE: Multidetector CT imaging of the abdomen and pelvis was performed following the standard protocol without IV contrast. COMPARISON:  05/19/2019 FINDINGS: Lower chest: No acute abnormality. Hepatobiliary: No solid liver abnormality is seen. No gallstones, gallbladder wall thickening, or biliary dilatation. Pancreas: Unremarkable. No pancreatic ductal dilatation or surrounding inflammatory changes. Spleen: Normal in size without significant abnormality. Adrenals/Urinary Tract: Adrenal glands are unremarkable. Atrophic kidneys. Hyperdense hemorrhagic or proteinaceous cysts of the left kidney, not significantly changed in appearance compared to prior examination. Bladder is unremarkable. Stomach/Bowel: Stomach is within normal limits. Appendix appears normal. No evidence of bowel wall thickening, distention, or inflammatory changes. Sigmoid diverticulosis. Vascular/Lymphatic: Aortic atherosclerosis. No enlarged abdominal or pelvic lymph nodes. Reproductive: Status post hysterectomy. Other: No  abdominal wall hernia or abnormality. No abdominopelvic ascites. Minimal soft tissue stranding in the left retroperitoneum at the site of a previously identified hematoma, since resolved. Musculoskeletal: No acute or significant osseous findings. IMPRESSION: 1. No acute noncontrast CT findings of the abdomen or pelvis to explain nausea, vomiting, or weight loss. 2. Minimal soft tissue stranding in the left retroperitoneum at the site of a previously identified hematoma, since resolved. 3. Sigmoid diverticulosis without evidence of acute diverticulitis. Aortic Atherosclerosis (ICD10-I70.0). Electronically Signed   By: Eddie Candle M.D.   On: 10/03/2020 14:35   DG Chest 1 View  Result Date: 10/03/2020 CLINICAL DATA:  Nausea, abdominal pain EXAM: CHEST  1 VIEW COMPARISON:  05/21/2019 and prior FINDINGS: Left chest wall pacing device. Patchy and confluent left basilar/retrocardiac opacities. No pneumothorax or pleural effusion. Cardiomegaly. Postsurgical appearance of the cardiomediastinal silhouette. Multilevel spondylosis. IMPRESSION: Left basilar/retrocardiac opacities. Differential includes atelectasis, edema or infection. Stable cardiomegaly. Safety pin overlies the right neck soft tissues. Correlate with patient history/physical exam. Electronically Signed   By: Primitivo Gauze M.D.   On: 10/03/2020 14:53   US ABDOMEN LIMITED RUQ (LIVER/GB)  Result Date: 10/03/2020 CLINICAL DATA:  Nausea for 3 weeks. EXAM: ULTRASOUND ABDOMEN LIMITED RIGHT UPPER QUADRANT COMPARISON:  Abdomen and pelvis CT, 05/19/2019. FINDINGS: Gallbladder: No gallstones or wall thickening visualized. No sonographic Murphy sign noted by sonographer. Common bile duct: Diameter: 5 mm Liver: Somewhat coarse and mildly increased liver parenchymal echogenicity. Normal liver size. No mass or focal lesion. Portal vein is patent on color Doppler imaging with normal direction of blood flow towards the liver. Other: None. IMPRESSION: 1. No  acute findings.  Normal gallbladder.  No bile duct dilation. 2. Coarsened liver echotexture with mildly increased overall echogenicity. This is nonspecific. It may  reflect mild hepatic steatosis. Consider cirrhosis if there are consistent clinical findings. Electronically Signed   By: Lajean Manes M.D.   On: 10/03/2020 15:18    EKG: Independently reviewed.  Atrial paced rhythm at 61 beats per  Assessment/Plan Acute renal failure superimposed on chronic kidney disease stage IV Uremia: Patient's baseline creatinine previously noted to be around 2, but she presents with creatinine elevated up to 2.82 with BUN 93.  The elevated BUN to creatinine ratio suggest a prerenal cause of symptoms.  Patient had recently been advised to decrease diuretic when following up with her nephrologist Dr. Clover Mealy.  -Admit to a medical telemetry bed -Strict intake and output -Check urinalysis to rule out possibility -Check urine sodium, creatinine, and urea -Normal saline IV fluids at 75 mL/h x1 to -Hold nephrotoxic agents -Case was discussed with Dr. Carolin Sicks of nephrology who agreed with a trial of IV fluid hydration and holding diuretics.  Formally consult nephrology if needed over the weekend if kidney function does not improve.   Nausea, vomiting, and abdominal pain  GERD : Patient presented with complaints of nausea, vomiting, and abdominal pain.  CT imaging did not note any clear abnormality.  Records note a prior history of esophagitis 2012 and need of esophageal dilation 2008 previously seen by Dr. Henrene Pastor.  Unclear if symptoms are related with overdiuresis versus uremia versus gastritis/pud versus other  -Clear liquid diet and advance as tolerated -Continue Protonix and add sucralfate -May warrant formal consult to GI for further evaluation if symptoms do not appear to improve  Failure to thrive: Patient reportedly has not been doing well and reported 7 pound weight loss here recently -PT/OT to eval and  treat  Elevated troponin: Acute.  Initial troponin elevated up to 110, but repeat delta troponin 102.  She denied any complaints of chest pain. -Continue to monitor  History of Atypical atrial flutter: Patient status post cardioversion in 2018. CHA2DS2-VASc score = 6, but patient has been off of anticoagulation of Xarelto since 04/2019 after patient was hospitalized and had acute blood loss anemia related left retroperitoneal bleed.  Patient has only been on Aspirin. -Continue aspirin  Diabetes mellitus type 2, uncontrolled: On admission patient blood glucose elevated up to 296.  Last hemoglobin A1c noted to be 7.1 on 05/15/2019.  Home regimen includes Lantus 22 units daily -Hypoglycemic protocol -Check hemoglobin A1c -Continue Lantus 22 units daily -CBGs before every meal and at bedtime with sensitive SSI  Diastolic congestive heart failure: Patient appears to be hypovolemic at this time.  Last EF noted to be 55-60% moderate LAE, mild RAE, mild MR, and mild to moderate TRin 04/2019. -Check daily weight -Hold torsemide due to [name]  Hypothyroidism -Add on TSH -Continue levothyroxine   Anemia of chronic disease: Stable.  Hemoglobin 10.5 g/dL which appears near and around patient's baseline. -Continue to monitor  History of tachybradycardia syndrome: s/p PM -Follow-up telemetry overnight    DVT prophylaxis: heparin Code Status: Full Family Communication: Daughter updated at bedside Disposition Plan: To be determined Consults called:  Admission status: inpatient   Norval Morton MD [facility] Pager [phone]   If 7PM-7AM, please contact night-coverage [email or web address] Password [facility]  10/03/2020, 4:36 PM

## 2020-10-04 DIAGNOSIS — N189 Chronic kidney disease, unspecified: Secondary | ICD-10-CM

## 2020-10-04 DIAGNOSIS — N179 Acute kidney failure, unspecified: Principal | ICD-10-CM

## 2020-10-04 LAB — CBC
HCT: 27.2 % — ABNORMAL LOW (ref 36.0–46.0)
Hemoglobin: 9 g/dL — ABNORMAL LOW (ref 12.0–15.0)
MCH: 33.5 pg (ref 26.0–34.0)
MCHC: 33.1 g/dL (ref 30.0–36.0)
MCV: 101.1 fL — ABNORMAL HIGH (ref 80.0–100.0)
Platelets: 152 10*3/uL (ref 150–400)
RBC: 2.69 MIL/uL — ABNORMAL LOW (ref 3.87–5.11)
RDW: 14.3 % (ref 11.5–15.5)
WBC: 6.9 10*3/uL (ref 4.0–10.5)
nRBC: 0 % (ref 0.0–0.2)

## 2020-10-04 LAB — BASIC METABOLIC PANEL
Anion gap: 11 (ref 5–15)
BUN: 78 mg/dL — ABNORMAL HIGH (ref 8–23)
CO2: 23 mmol/L (ref 22–32)
Calcium: 8.4 mg/dL — ABNORMAL LOW (ref 8.9–10.3)
Chloride: 106 mmol/L (ref 98–111)
Creatinine, Ser: 2.48 mg/dL — ABNORMAL HIGH (ref 0.44–1.00)
GFR, Estimated: 19 mL/min — ABNORMAL LOW (ref >60)
Glucose, Bld: 117 mg/dL — ABNORMAL HIGH (ref 70–99)
Potassium: 3.9 mmol/L (ref 3.5–5.1)
Sodium: 140 mmol/L (ref 135–145)

## 2020-10-04 LAB — TSH: TSH: 0.237 u[IU]/mL — ABNORMAL LOW (ref 0.350–4.500)

## 2020-10-04 LAB — GLUCOSE, CAPILLARY
Glucose-Capillary: 156 mg/dL — ABNORMAL HIGH (ref 70–99)
Glucose-Capillary: 176 mg/dL — ABNORMAL HIGH (ref 70–99)
Glucose-Capillary: 176 mg/dL — ABNORMAL HIGH (ref 70–99)

## 2020-10-04 LAB — CBG MONITORING, ED
Glucose-Capillary: 127 mg/dL — ABNORMAL HIGH (ref 70–99)
Glucose-Capillary: 104 mg/dL — ABNORMAL HIGH (ref 70–99)

## 2020-10-04 LAB — UREA NITROGEN, URINE: Urea Nitrogen, Ur: 582 mg/dL

## 2020-10-04 LAB — HEMOGLOBIN A1C
Hgb A1c MFr Bld: 8.6 % — ABNORMAL HIGH (ref 4.8–5.6)
Mean Plasma Glucose: 200.12 mg/dL

## 2020-10-04 NOTE — ED Notes (Signed)
Pt up to use [facility] with 1 staff assist

## 2020-10-04 NOTE — ED Notes (Signed)
Scrabbled eggs and toast ordered for breakfast

## 2020-10-04 NOTE — ED Notes (Signed)
Breakfast Ordered 

## 2020-10-04 NOTE — Evaluation (Signed)
Physical Therapy Evaluation Patient Details Name: [name] MRN: [id number] DOB: [birth year] Today's Date: 10/04/2020   History of Present Illness  Pt is an 79 year old female who presented with fatigue, nausea, bouts of emesis, weight loss, lower abdominal pain, and poor apetite due to nausea. The epigastric abdominal pain has been present for ~1 month and worsens with eating. She displayed creatinine elevated up to 2.82 with BUN 93, suggesting a prerenal cause of symptoms. CT negative for acute findings in abdomen or pelvis to explain symptoms. PMH: DM2, tachy-brady syndrome, skin cancer, s/p CABG 12/04/11, presence of permanent cardiac pacemaker, paroxysmal atrial flutter, PAF, MI, mild vascular neurocognitive disorder, depression, hypothyroisidm, HTN, hiatal hernia, GERD, esophageal stricture, divurticulosis, CAD, CKD stage 3, CHF, arthritis, and anemia.  Clinical Impression  RN cleared pt for session. Pt presented with condition mentioned above and deficits mentioned below, see PT Problem List. She lives alone and intermittently utilizes a RW during days of feeling weak but otherwise is independent without AD for mobility at baseline. She utilizes a SPC in the community. She has 24/7 supervision/assistance available if needed. Pt demonstrated slightly decreased strength in her R leg compared to her L this date. Also, her balance appears to be impaired compared to her baseline as she was only momentarily able to stand without any UE support before reaching for something to hold onto again. She did not display a bout of LOB, but did display trunk sway, placing her at risk for falls. In addition, she demonstrates poor safety awareness with use of a RW as she tends to push the RW distal to her body and not attend to the L side of the RW, bumping and getting the L side stuck on obstacles regularly, despite cues to correct. Pt easily fatigued, resting often during gait bout. She required min guard assist  for all functional mobility this date. Will continue to follow acutely. Recommending pt follow-up with [facility] PT upon d/c to address her deficits to maximize her independence and safety with all functional mobility.     Follow Up Recommendations Home health PT;Supervision for mobility/OOB    Equipment Recommendations  None recommended by PT    Recommendations for Other Services       Precautions / Restrictions Precautions Precautions: Fall Restrictions Weight Bearing Restrictions: No      Mobility  Bed Mobility Overal bed mobility: Needs Assistance Bed Mobility: Supine to Sit     Supine to sit: HOB elevated;Min guard     General bed mobility comments: Extra time and min guard for safety to transition to sit R EOB with use of R bed rail to simulate home set-up. Shakiness in R arm noted with straining to push up with R to ascend trunk.    Transfers Overall transfer level: Needs assistance Equipment used: Rolling walker (2 wheeled) Transfers: Sit to/from Stand Sit to Stand: Min guard         General transfer comment: Extra time and min guard for safety to power up to stand.  Ambulation/Gait Ambulation/Gait assistance: Min guard Gait Distance (Feet): 200 Feet Assistive device: Rolling walker (2 wheeled) Gait Pattern/deviations: Step-through pattern;Shuffle;Decreased dorsiflexion - right;Decreased dorsiflexion - left;Decreased stride length;Trunk flexed Gait velocity: decreased Gait velocity interpretation: 1.31 - 2.62 ft/sec, indicative of limited community ambulator General Gait Details: Ambulates with poor feet clearance, shuffling pattern and decreased bilat step length. Tendency to flex at trunk and lean on hands anterior on RW, pushing RW distal to body. Cues to correct and remain  within RW, with momentary success. Tendency to bump obstacles with L side of RW and get stuck, despite cues to correct and acknowledge L side. Pt required ~8 standing rest breaks due to fatigue  (reproted fatigue more in arms). Pt reports tendency at home to walk and gain speed and get too fast for feet and fall at baseline.  Stairs            Wheelchair Mobility    Modified Rankin (Stroke Patients Only)       Balance Overall balance assessment: Needs assistance Sitting-balance support: No upper extremity supported;Feet supported Sitting balance-Leahy Scale: Good Sitting balance - Comments: Static sitting EOB, no LOB. Supervision for safety.   Standing balance support: Bilateral upper extremity supported;During functional activity;No upper extremity supported Standing balance-Leahy Scale: Fair Standing balance comment: Able to release 1-2 UEs from RW momenatrily before reaching for another object to hold onto, min guard for safety. No overt LOB during session.                             Pertinent Vitals/Pain Pain Assessment: No/denies pain    Home Living Family/patient expects to be discharged to:: Private residence Living Arrangements: Alone Available Help at Discharge: Family;Available 24 hours/day Type of Home: House Home Access: Stairs to enter Entrance Stairs-Rails: None Entrance Stairs-Number of Steps: 3 Home Layout: One level Home Equipment: Bedside commode;Walker - 2 wheels;Cane - quad;Cane - single point;Shower seat;Hand held shower head Additional Comments: Mechnical adjustable bed wit rail attached to R head of bed    Prior Function Level of Independence: Independent with assistive device(s)         Comments: In the home, majority of days she does not utilize an AD/AE for mobility but if weak she uses RW. When in community, she uses SPC. Intermittently gets assistance for showers from daughter or another friend if weak.     Hand Dominance   Dominant Hand: Right    Extremity/Trunk Assessment   Upper Extremity Assessment Upper Extremity Assessment: Defer to OT evaluation    Lower Extremity Assessment Lower Extremity  Assessment: RLE deficits/detail;LLE deficits/detail RLE Deficits / Details: MMT scores of 3+ to 4- grossly RLE Sensation: history of peripheral neuropathy RLE Coordination: WNL LLE Deficits / Details: MMT scores of 4 to 4+ grossly LLE Sensation: history of peripheral neuropathy LLE Coordination: WNL       Communication   Communication: HOH (2 hearing aides)  Cognition Arousal/Alertness: Awake/alert Behavior During Therapy: WFL for tasks assessed/performed Overall Cognitive Status: Impaired/Different from baseline Area of Impairment: Orientation;Memory;Following commands;Safety/judgement;Awareness;Problem solving                 Orientation Level: Disoriented to;Time (did not know year)   Memory: Decreased short-term memory Following Commands: Follows one step commands consistently;Follows one step commands with increased time Safety/Judgement: Decreased awareness of safety;Decreased awareness of deficits Awareness: Emergent Problem Solving: Slow processing;Difficulty sequencing;Requires verbal cues General Comments: Pt disoriented to year, which pt's daughter states that occasionally the pt gets confused on which day of week it is at home. Pt tends to bump obstacles with L side of RW even after cues to be aware of L side. Required cues to acknowledge L side of RW getting stuck on obstacle and how to problem-solve to get unstuck.      General Comments      Exercises     Assessment/Plan    PT Assessment Patient needs continued PT services  PT Problem List Decreased strength;Decreased activity tolerance;Decreased balance;Decreased mobility;Decreased cognition;Decreased knowledge of use of DME;Decreased safety awareness;Impaired sensation       PT Treatment Interventions DME instruction;Gait training;Stair training;Functional mobility training;Therapeutic activities;Therapeutic exercise;Balance training;Neuromuscular re-education;Cognitive remediation;Patient/family  education    PT Goals (Current goals can be found in the Care Plan section)  Acute Rehab PT Goals Patient Stated Goal: to feel better PT Goal Formulation: With patient/family Time For Goal Achievement: 10/18/20 Potential to Achieve Goals: Good    Frequency Min 3X/week   Barriers to discharge        Co-evaluation               AM-PAC PT "6 Clicks" Mobility  Outcome Measure Help needed turning from your back to your side while in a flat bed without using bedrails?: A Little Help needed moving from lying on your back to sitting on the side of a flat bed without using bedrails?: A Little Help needed moving to and from a bed to a chair (including a wheelchair)?: A Little Help needed standing up from a chair using your arms (e.g., wheelchair or bedside chair)?: A Little Help needed to walk in hospital room?: A Little Help needed climbing 3-5 steps with a railing? : A Little 6 Click Score: 18    End of Session Equipment Utilized During Treatment: Gait belt Activity Tolerance: Patient tolerated treatment well;Patient limited by fatigue Patient left: in chair;with call bell/phone within reach;with chair alarm set;with family/visitor present Nurse Communication: Mobility status PT Visit Diagnosis: Unsteadiness on feet (R26.81);Other abnormalities of gait and mobility (R26.89);History of falling (Z91.81);Muscle weakness (generalized) (M62.81);Difficulty in walking, not elsewhere classified (R26.2)    Time: 6384-6659 PT Time Calculation (min) (ACUTE ONLY): 41 min   Charges:   PT Evaluation $PT Eval Moderate Complexity: 1 Mod PT Treatments $Gait Training: 8-22 mins $Therapeutic Activity: 8-22 mins        Moishe Spice, PT, DPT Acute Rehabilitation Services  Pager: [phone] Office: [phone]   Orvan Falconer 10/04/2020, 3:49 PM

## 2020-10-04 NOTE — Progress Notes (Signed)
PROGRESS NOTE    [name]  CWU:[id number] DOB: 1941/09/12 DOA: 10/03/2020 PCP: Leeroy Cha, MD   Brief Narrative:  Patient is a 79 year old female with past medical history of chronic diastolic CHF, A. fib-not on anticoagulation, coronary artery disease, sick sinus syndrome status post pacemaker placement, CKD stage IV, diabetes mellitus, chronic anemia, GERD, esophagitis, hypothyroidism presents to emergency department with abdominal pain associated with nausea and intermittent vomiting, decreased appetite and generalized weakness.  On arrival to ED: Patient afebrile with vital signs relatively stable.  Labs significant for hemoglobin of 10.5, sodium 133, BUN: 93, creatinine: 2.82, lipase: 82.  CT scan abdomen/pelvis showed no acute abnormalities.  Right upper quadrant ultrasound did not show any acute findings.  Patient was given 500 cc of normal saline and Zofran.  COVID-19 negative.  Patient admitted for further evaluation and management of worsening kidney function.  Assessment & Plan:  [name] on CKD stage IV: -Baseline creatinine between 2-2.5.  On admission creatinine elevated up to 2.82, BUN: 93.  Her diuretic dose was recently decreased by her nephrologist Dr. Moshe Cipro. -Patient was given 500 cc of IV fluid in ED.  Continue gentle hydration with normal saline at 75 mL/h.  Avoid nephrotoxic medication.   -Kidney function slightly improved from yesterday.  BUN 78, creatinine: 2.48, GFR: 19(which is closer to her baseline) -Discussed with nephrology-continue gentle hydration.  Continue to hold torsemide.  No indication of dialysis at this time.  N/V and abdominal pain: -Patient is afebrile, UA negative.  CT abdomen/pelvis: No acute abnormalities.  Right upper quadrant ultrasound negative for cholecystitis.  UA negative for infection. -Continue with gentle hydration, Zofran as needed for nausea and vomiting and advance diet as tolerated.  Elevated troponin: Patient  denies ACS symptoms.  Initial troponin trended up to 110 then trended down to 102.  Likely demand ischemia in the setting of worsening kidney function.  EKG: No acute ischemic changes as compared to previous EKG. -Continue aspirin, Coreg, statin -Continue to monitor.  History of A. fib/atypical atrial flutter: Status post cardioversion in 2018.  Mali vas score: 6.  Patient is off of Xarelto since 04/2019 after acute blood loss anemia in the setting of retroperitoneal bleed. -Continue aspirin and Coreg.  Type 2 diabetes mellitus: Uncontrolled.  A1c trended up from 7.1-8.6. -Continue sliding scale insulin and Lantus 22 units daily.  Continue to monitor blood sugar closely.  Chronic diastolic CHF: Patient appears euvolemic on exam. -Last echo noted to have ejection fraction of 55 to 60%, mild MR, mild to moderate TR, moderate LAE, mild RAE. -Continue to hold torsemide. -Strict INO's and daily weight.  Monitor signs of fluid overload.  Hypothyroidism: TSH: On the lower side. -Continue home dose of levothyroxine.  Repeat TSH outpatient in 6 to 8 weeks  Anemia of chronic disease: Likely in the setting of CKD stage IV. -H&H appears to be at baseline.  Continue to monitor.  Sick sinus syndrome/tachybradycardia syndrome: Status post pacemaker placement: -Continue to monitor on telemetry.  GERD with esophagitis: -Continue Protonix and Carafate.  Hyperlipidemia: Continue statin  Failure to thrive: -Will consult dietitian -Commended home health PT.  DVT prophylaxis: Heparin Code Status: Full code Family Communication:  None present at bedside.  Plan of care discussed with patient in length and she verbalized understanding and agreed with it. Disposition Plan: Home 1 to 2 days  Consultants:   Nephrology  Procedures:   CT abdomen/pelvis  Antimicrobials:  None Status is: Inpatient   Dispo: The patient is from:  Home              Anticipated d/c is to: Home              Anticipated  d/c date is: 2 days              Patient currently is not medically stable to d/c.   Subjective: Patient seen and examined in ED.  Tells me that her nausea and abdominal pain has improved.  She denies chest pain, shortness of breath, leg swelling, orthopnea or PND, fever or chills.  She is alert and following commands.  Tells me that she lives alone at home and independent on daily life activities.  Objective: Vitals:   10/04/20 0545 10/04/20 0600 10/04/20 0846 10/04/20 1001  BP: (!) 150/53 (!) 138/46 (!) 159/94 (!) 87/75  Pulse: 63 60 66 66  Resp: 18 17 17 18   Temp:   98.3 F (36.8 C) 99.2 F (37.3 C)  TempSrc:   Temporal Oral  SpO2: 98% 97% 100% 100%  Weight:    59.1 kg  Height:    5\' 1"  (1.549 m)    Intake/Output Summary (Last 24 hours) at 10/04/2020 1604 Last data filed at 10/04/2020 1018 Gross per 24 hour  Intake 763.72 ml  Output 75 ml  Net 688.72 ml   Filed Weights   10/03/20 1303 10/04/20 1001  Weight: 59 kg 59.1 kg    Examination:  General exam: Appears calm and comfortable, elderly looking, on room air, communicating well Respiratory system: Clear to auscultation. Respiratory effort normal. Cardiovascular system: S1 & S2 heard, RRR. No JVD, murmurs, rubs, gallops or clicks. No pedal edema. Gastrointestinal system: Abdomen is nondistended, soft and nontender. No organomegaly or masses felt. Normal bowel sounds heard. Central nervous system: Alert and oriented. No focal neurological deficits. Extremities: Symmetric 5 x 5 power. Skin: No rashes, lesions or ulcers Psychiatry: Judgement and insight appear normal. Mood & affect appropriate.    Data Reviewed: I have personally reviewed following labs and imaging studies  CBC: Recent Labs  Lab 10/03/20 1310 10/04/20 0629  WBC 8.0 6.9  HGB 10.5* 9.0*  HCT 30.2* 27.2*  MCV 98.1 101.1*  PLT 185 400   Basic Metabolic Panel: Recent Labs  Lab 10/03/20 1310 10/04/20 0629  NA 133* 140  K 4.0 3.9  CL 96* 106   CO2 21* 23  GLUCOSE 296* 117*  BUN 93* 78*  CREATININE 2.82* 2.48*  CALCIUM 9.1 8.4*   GFR: Estimated Creatinine Clearance: 14.9 mL/min (A) (by C-G formula based on SCr of 2.48 mg/dL (H)). Liver Function Tests: Recent Labs  Lab 10/03/20 1310  AST 26  ALT 17  ALKPHOS 75  BILITOT 0.7  PROT 6.9  ALBUMIN 3.5   Recent Labs  Lab 10/03/20 1310  LIPASE 82*   No results for input(s): AMMONIA in the last 168 hours. Coagulation Profile: No results for input(s): INR, PROTIME in the last 168 hours. Cardiac Enzymes: No results for input(s): CKTOTAL, CKMB, CKMBINDEX, TROPONINI in the last 168 hours. BNP (last 3 results) No results for input(s): PROBNP in the last 8760 hours. HbA1C: Recent Labs    10/04/20 0629  HGBA1C 8.6*   CBG: Recent Labs  Lab 10/03/20 2120 10/04/20 0525 10/04/20 0844 10/04/20 1201  GLUCAP 198* 127* 104* 156*   Lipid Profile: No results for input(s): CHOL, HDL, LDLCALC, TRIG, CHOLHDL, LDLDIRECT in the last 72 hours. Thyroid Function Tests: Recent Labs    10/04/20 0629  TSH 0.237*  Anemia Panel: No results for input(s): VITAMINB12, FOLATE, FERRITIN, TIBC, IRON, RETICCTPCT in the last 72 hours. Sepsis Labs: No results for input(s): PROCALCITON, LATICACIDVEN in the last 168 hours.  Recent Results (from the past 240 hour(s))  Resp Panel by RT-PCR (Flu A&B, Covid) Nasopharyngeal Swab     Status: None   Collection Time: 10/03/20  4:50 PM   Specimen: Nasopharyngeal Swab; Nasopharyngeal(NP) swabs in vial transport medium  Result Value Ref Range Status   SARS Coronavirus 2 by RT PCR NEGATIVE NEGATIVE Final    Comment: (NOTE) SARS-CoV-2 target nucleic acids are NOT DETECTED.  The SARS-CoV-2 RNA is generally detectable in upper respiratory specimens during the acute phase of infection. The lowest concentration of SARS-CoV-2 viral copies this assay can detect is 138 copies/mL. A negative result does not preclude SARS-Cov-2 infection and should not  be used as the sole basis for treatment or other patient management decisions. A negative result may occur with  improper specimen collection/handling, submission of specimen other than nasopharyngeal swab, presence of viral mutation(s) within the areas targeted by this assay, and inadequate number of viral copies(<138 copies/mL). A negative result must be combined with clinical observations, patient history, and epidemiological information. The expected result is Negative.  Fact Sheet for Patients:  EntrepreneurPulse.com.au  Fact Sheet for Healthcare Providers:  IncredibleEmployment.be  This test is no t yet approved or cleared by the [place] FDA and  has been authorized for detection and/or diagnosis of SARS-CoV-2 by FDA under an Emergency Use Authorization (EUA). This EUA will remain  in effect (meaning this test can be used) for the duration of the COVID-19 declaration under Section 564(b)(1) of the Act, 21 U.S.C.section 360bbb-3(b)(1), unless the authorization is terminated  or revoked sooner.       Influenza A by PCR NEGATIVE NEGATIVE Final   Influenza B by PCR NEGATIVE NEGATIVE Final    Comment: (NOTE) The Xpert Xpress SARS-CoV-2/FLU/RSV plus assay is intended as an aid in the diagnosis of influenza from Nasopharyngeal swab specimens and should not be used as a sole basis for treatment. Nasal washings and aspirates are unacceptable for Xpert Xpress SARS-CoV-2/FLU/RSV testing.  Fact Sheet for Patients: EntrepreneurPulse.com.au  Fact Sheet for Healthcare Providers: IncredibleEmployment.be  This test is not yet approved or cleared by the [place] FDA and has been authorized for detection and/or diagnosis of SARS-CoV-2 by FDA under an Emergency Use Authorization (EUA). This EUA will remain in effect (meaning this test can be used) for the duration of the COVID-19 declaration under Section  564(b)(1) of the Act, 21 U.S.C. section 360bbb-3(b)(1), unless the authorization is terminated or revoked.  Performed at [facility], [place] [address], [place], [place] [address]       Radiology Studies: CT ABDOMEN PELVIS WO CONTRAST  Result Date: 10/03/2020 CLINICAL DATA:  Nausea, vomiting, weight loss for several weeks, not improving EXAM: CT ABDOMEN AND PELVIS WITHOUT CONTRAST TECHNIQUE: Multidetector CT imaging of the abdomen and pelvis was performed following the standard protocol without IV contrast. COMPARISON:  05/19/2019 FINDINGS: Lower chest: No acute abnormality. Hepatobiliary: No solid liver abnormality is seen. No gallstones, gallbladder wall thickening, or biliary dilatation. Pancreas: Unremarkable. No pancreatic ductal dilatation or surrounding inflammatory changes. Spleen: Normal in size without significant abnormality. Adrenals/Urinary Tract: Adrenal glands are unremarkable. Atrophic kidneys. Hyperdense hemorrhagic or proteinaceous cysts of the left kidney, not significantly changed in appearance compared to prior examination. Bladder is unremarkable. Stomach/Bowel: Stomach is within normal limits. Appendix appears normal. No evidence of  bowel wall thickening, distention, or inflammatory changes. Sigmoid diverticulosis. Vascular/Lymphatic: Aortic atherosclerosis. No enlarged abdominal or pelvic lymph nodes. Reproductive: Status post hysterectomy. Other: No abdominal wall hernia or abnormality. No abdominopelvic ascites. Minimal soft tissue stranding in the left retroperitoneum at the site of a previously identified hematoma, since resolved. Musculoskeletal: No acute or significant osseous findings. IMPRESSION: 1. No acute noncontrast CT findings of the abdomen or pelvis to explain nausea, vomiting, or weight loss. 2. Minimal soft tissue stranding in the left retroperitoneum at the site of a previously identified hematoma, since resolved. 3. Sigmoid diverticulosis without evidence  of acute diverticulitis. Aortic Atherosclerosis (ICD10-I70.0). Electronically Signed   By: Eddie Candle M.D.   On: 10/03/2020 14:35   DG Chest 1 View  Result Date: 10/03/2020 CLINICAL DATA:  Nausea, abdominal pain EXAM: CHEST  1 VIEW COMPARISON:  05/21/2019 and prior FINDINGS: Left chest wall pacing device. Patchy and confluent left basilar/retrocardiac opacities. No pneumothorax or pleural effusion. Cardiomegaly. Postsurgical appearance of the cardiomediastinal silhouette. Multilevel spondylosis. IMPRESSION: Left basilar/retrocardiac opacities. Differential includes atelectasis, edema or infection. Stable cardiomegaly. Safety pin overlies the right neck soft tissues. Correlate with patient history/physical exam. Electronically Signed   By: Primitivo Gauze M.D.   On: 10/03/2020 14:53   US ABDOMEN LIMITED RUQ (LIVER/GB)  Result Date: 10/03/2020 CLINICAL DATA:  Nausea for 3 weeks. EXAM: ULTRASOUND ABDOMEN LIMITED RIGHT UPPER QUADRANT COMPARISON:  Abdomen and pelvis CT, 05/19/2019. FINDINGS: Gallbladder: No gallstones or wall thickening visualized. No sonographic Murphy sign noted by sonographer. Common bile duct: Diameter: 5 mm Liver: Somewhat coarse and mildly increased liver parenchymal echogenicity. Normal liver size. No mass or focal lesion. Portal vein is patent on color Doppler imaging with normal direction of blood flow towards the liver. Other: None. IMPRESSION: 1. No acute findings.  Normal gallbladder.  No bile duct dilation. 2. Coarsened liver echotexture with mildly increased overall echogenicity. This is nonspecific. It may reflect mild hepatic steatosis. Consider cirrhosis if there are consistent clinical findings. Electronically Signed   By: Lajean Manes M.D.   On: 10/03/2020 15:18    Scheduled Meds: . aspirin EC  81 mg Oral Daily  . carvedilol  25 mg Oral BID  . gabapentin  300 mg Oral QHS  . heparin  5,000 Units Subcutaneous Q8H  . insulin aspart  0-5 Units Subcutaneous QHS  .  insulin aspart  0-9 Units Subcutaneous TID WC  . insulin glargine  22 Units Subcutaneous Daily  . levothyroxine  88 mcg Oral QAC breakfast  . pantoprazole  40 mg Oral Daily  . rosuvastatin  10 mg Oral Daily  . sodium chloride flush  3 mL Intravenous Q12H  . sucralfate  1 g Oral BID   Continuous Infusions: . sodium chloride 75 mL/hr at 10/04/20 0847     LOS: 1 day   Time spent: 35 minutes   Hanan Moen Loann Quill, MD [facility]  If 7PM-7AM, please contact night-coverage [email or web address] 10/04/2020, 4:04 PM

## 2020-10-05 LAB — CBC
HCT: 26.3 % — ABNORMAL LOW (ref 36.0–46.0)
Hemoglobin: 8.8 g/dL — ABNORMAL LOW (ref 12.0–15.0)
MCH: 33.3 pg (ref 26.0–34.0)
MCHC: 33.5 g/dL (ref 30.0–36.0)
MCV: 99.6 fL (ref 80.0–100.0)
Platelets: 148 10*3/uL — ABNORMAL LOW (ref 150–400)
RBC: 2.64 MIL/uL — ABNORMAL LOW (ref 3.87–5.11)
RDW: 14.3 % (ref 11.5–15.5)
WBC: 7.4 10*3/uL (ref 4.0–10.5)
nRBC: 0 % (ref 0.0–0.2)

## 2020-10-05 LAB — COMPREHENSIVE METABOLIC PANEL
ALT: 19 U/L (ref 0–44)
AST: 30 U/L (ref 15–41)
Albumin: 2.7 g/dL — ABNORMAL LOW (ref 3.5–5.0)
Alkaline Phosphatase: 56 U/L (ref 38–126)
Anion gap: 10 (ref 5–15)
BUN: 62 mg/dL — ABNORMAL HIGH (ref 8–23)
CO2: 23 mmol/L (ref 22–32)
Calcium: 8.5 mg/dL — ABNORMAL LOW (ref 8.9–10.3)
Chloride: 109 mmol/L (ref 98–111)
Creatinine, Ser: 2.33 mg/dL — ABNORMAL HIGH (ref 0.44–1.00)
GFR, Estimated: 21 mL/min — ABNORMAL LOW (ref >60)
Glucose, Bld: 122 mg/dL — ABNORMAL HIGH (ref 70–99)
Potassium: 4.2 mmol/L (ref 3.5–5.1)
Sodium: 142 mmol/L (ref 135–145)
Total Bilirubin: 0.7 mg/dL (ref 0.3–1.2)
Total Protein: 5.7 g/dL — ABNORMAL LOW (ref 6.5–8.1)

## 2020-10-05 LAB — GLUCOSE, CAPILLARY
Glucose-Capillary: 102 mg/dL — ABNORMAL HIGH (ref 70–99)
Glucose-Capillary: 297 mg/dL — ABNORMAL HIGH (ref 70–99)

## 2020-10-05 LAB — MAGNESIUM: Magnesium: 2 mg/dL (ref 1.7–2.4)

## 2020-10-05 NOTE — Discharge Summary (Signed)
Physician Discharge Summary  [name] GGE:[id number] DOB: 09/04/1941 DOA: 10/03/2020  PCP: Leeroy Cha, MD  Admit date: 10/03/2020 Discharge date: 10/05/2020  Admitted From: Home Disposition:   Home  Recommendations for Outpatient Follow-up:  1. Follow-up with PCP in 1 week 2. Repeat BMP on follow-up visit 3. Follow-up with cardiology as a scheduled 4. Follow-up with nephrology outpatient in 1 week  Home Health: Yes with PT/OT Equipment/Devices: None Discharge Condition: Stable CODE STATUS: Full code Diet recommendation: Renal diet  Brief/Interim Summary: Patient is a 79 year old female with past medical history of chronic diastolic CHF, A. fib-not on anticoagulation, coronary artery disease, sick sinus syndrome status post pacemaker placement, CKD stage IV, diabetes mellitus, chronic anemia, GERD, esophagitis, hypothyroidism presents to emergency department with abdominal pain associated with nausea and intermittent vomiting, decreased appetite and generalized weakness.  On arrival to ED: Patient afebrile with vital signs relatively stable.  Labs significant for hemoglobin of 10.5, sodium 133, BUN: 93, creatinine: 2.82, lipase: 82.  CT scan abdomen/pelvis showed no acute abnormalities.  Right upper quadrant ultrasound did not show any acute findings.  Patient was given 500 cc of normal saline and Zofran.  COVID-19 negative.  Patient admitted for further evaluation and management of worsening kidney function.  [name] on CKD stage IV: -Baseline creatinine between 2-2.5.  On admission creatinine elevated up to 2.82, BUN: 93.  Her diuretic dose was recently decreased by her nephrologist Dr. Moshe Cipro. -Patient was given 500 cc of IV fluid in ED.  Continued gentle hydration with normal saline at 75 mL/h.  Avoid nephrotoxic medication.   -Discussed with nephrology-recommended to continue gentle hydration. hold torsemide.  No indication of dialysis at this time. -Kidney  function improved to 62, creatinine: 2.33, GFR: 21.  N/V and abdominal pain: Resolved -Patient remained afebrile, UA negative for infection.  CT abdomen/pelvis: No acute abnormalities.  Right upper quadrant ultrasound negative for cholecystitis.   -Continued with gentle hydration, Zofran as needed for nausea and vomiting and advance diet as tolerated.  Elevated troponin: Patient denied ACS symptoms.  Initial troponin trended up to 110 then trended down to 102.  Likely demand ischemia in the setting of worsening kidney function.  EKG: No acute ischemic changes as compared to previous EKG. -Continued aspirin, Coreg, statin  History of A. fib/atypical atrial flutter: Status post cardioversion in 2018.    CHA2DS2-VASc score: 6.  Patient is off of Xarelto since 04/2019 after acute blood loss anemia in the setting of retroperitoneal bleed. -Continued aspirin and Coreg.  Type 2 diabetes mellitus: Uncontrolled.  A1c trended up from 7.1-8.6. -Continued sliding scale insulin and Lantus 22 units daily.  -Close follow-up with PCP regarding uncontrolled diabetes mellitus.  Chronic diastolic CHF: Patient appears euvolemic on exam. -Last echo noted to have ejection fraction of 55 to 60%, mild MR, mild to moderate TR, moderate LAE, mild RAE.  Torsemide held at the time of admission. -Strict INO's and daily weight.  Monitored signs of fluid overload.  Hypothyroidism: TSH: On the lower side. -Continued home dose of levothyroxine.  Repeat TSH outpatient in 6 to 8 weeks  Anemia of chronic disease: Likely in the setting of CKD stage IV. -H&H appears to be at baseline.  Sick sinus syndrome/tachybradycardia syndrome: Status post pacemaker placement: -Patient remained asymptomatic.  GERD with esophagitis: -Continued Protonix and Carafate.  Hyperlipidemia: Continued statin  Failure to thrive: -PT OT recommended home health PT/OT-arranged at the time of admission -Consulted dietitian-appreciate  help.  Disposition: DC home with home health  services.  Patient stable at the time of discharge.  Her kidney function improved.  Resumed torsemide.  Has a scheduled appointment with cardiology tomorrow.  Follow-up with PCP for uncontrolled diabetes and nephrology Dr. Moshe Cipro in 1 week.  Discharge Diagnoses:  [name] on CKD stage IV Vomiting and abdominal pain Elevated troponin History of A. fib/atypical atrial flutter Type 2 diabetes mellitus Chronic diastolic CHF Hypothyroidism Anemia of chronic disease Sick sinus syndrome/tachybradycardia syndrome status post pacemaker placement GERD with esophagitis Hyperlipidemia Failure to thrive   Discharge Instructions  Discharge Instructions    Diet - low sodium heart healthy   Complete by: As directed    Discharge instructions   Complete by: As directed    Follow-up with PCP in 1 week Repeat BMP on follow-up visit Follow-up with cardiology as a scheduled Follow-up with nephrology outpatient   Increase activity slowly   Complete by: As directed      Allergies as of 10/05/2020      Reactions   Clonidine Derivatives Other (See Comments)   Bradycardia and fatigue   Sulfa Antibiotics Other (See Comments)   Unknown   Crestor [rosuvastatin Calcium] Other (See Comments)   Other reaction(s): tired and weak   Epinephrine Other (See Comments)   Abnormal feeling. Dental exam/injection of local w/ epi.   Hydralazine Other (See Comments)   Nausea/gi upset   Losartan Other (See Comments)   Hyperkalemia   Other Other (See Comments)   MANGO'S - WELTS ALL OVER      Medication List    TAKE these medications   acetaminophen 650 MG CR tablet Commonly known as: TYLENOL Take 650 mg by mouth every 8 (eight) hours as needed for pain.   allopurinol 100 MG tablet Commonly known as: ZYLOPRIM Take 100 mg by mouth daily.   carvedilol 25 MG tablet Commonly known as: COREG Take 1 tablet (25 mg total) by mouth 2 (two) times daily.    diclofenac Sodium 1 % Gel Commonly known as: VOLTAREN Apply 2 g topically 2 (two) times daily as needed (pain).   docusate sodium 100 MG capsule Commonly known as: COLACE Take 100 mg by mouth 2 (two) times daily as needed for mild constipation or moderate constipation.   ferrous sulfate 325 (65 FE) MG EC tablet Take 325 mg by mouth daily with breakfast.   gabapentin 300 MG capsule Commonly known as: NEURONTIN Take 1 capsule (300 mg total) by mouth at bedtime.   Lantus SoloStar 100 UNIT/ML Solostar Pen Generic drug: insulin glargine Inject 22 Units into the skin daily.   levothyroxine 88 MCG tablet Commonly known as: SYNTHROID Take 88 mcg by mouth daily before breakfast.   meclizine 25 MG tablet Commonly known as: ANTIVERT Take 25 mg by mouth daily as needed for dizziness. For dizziness   nitroGLYCERIN 0.4 MG SL tablet Commonly known as: NITROSTAT Place 1 tablet (0.4 mg total) under the tongue every 5 (five) minutes as needed for chest pain (max 3 doses).   pantoprazole 40 MG tablet Commonly known as: PROTONIX Take 40 mg by mouth daily.   PRESERVISION AREDS 2 PO Take 1 tablet by mouth every evening.   rosuvastatin 10 MG tablet Commonly known as: CRESTOR Take 10 mg by mouth daily.   SYSTANE OP Place 1 drop into both eyes 2 (two) times daily as needed (dry eyes).   torsemide 20 MG tablet Commonly known as: DEMADEX Take 2 tablets (40 mg total) by mouth daily.       Follow-up Information  Leeroy Cha, MD Follow up in 1 week(s).   Specialty: Internal Medicine Contact information: [address] [place] [address] [phone]        Constance Haw, MD .   Specialty: Cardiology Contact information: [place] [address] [phone]        [name], MD .   Specialty: Cardiology Contact information: [address] [place]  [address] [phone]              Allergies   Allergen Reactions  . Clonidine Derivatives Other (See Comments)    Bradycardia and fatigue   . Sulfa Antibiotics Other (See Comments)    Unknown  . Crestor [Rosuvastatin Calcium] Other (See Comments)    Other reaction(s): tired and weak  . Epinephrine Other (See Comments)    Abnormal feeling. Dental exam/injection of local w/ epi.  Marland Kitchen Hydralazine Other (See Comments)    Nausea/gi upset   . Losartan Other (See Comments)    Hyperkalemia   . Other Other (See Comments)    MANGO'S - WELTS ALL OVER    Consultations:  Nephrology   Procedures/Studies: CT ABDOMEN PELVIS WO CONTRAST  Result Date: 10/03/2020 CLINICAL DATA:  Nausea, vomiting, weight loss for several weeks, not improving EXAM: CT ABDOMEN AND PELVIS WITHOUT CONTRAST TECHNIQUE: Multidetector CT imaging of the abdomen and pelvis was performed following the standard protocol without IV contrast. COMPARISON:  05/19/2019 FINDINGS: Lower chest: No acute abnormality. Hepatobiliary: No solid liver abnormality is seen. No gallstones, gallbladder wall thickening, or biliary dilatation. Pancreas: Unremarkable. No pancreatic ductal dilatation or surrounding inflammatory changes. Spleen: Normal in size without significant abnormality. Adrenals/Urinary Tract: Adrenal glands are unremarkable. Atrophic kidneys. Hyperdense hemorrhagic or proteinaceous cysts of the left kidney, not significantly changed in appearance compared to prior examination. Bladder is unremarkable. Stomach/Bowel: Stomach is within normal limits. Appendix appears normal. No evidence of bowel wall thickening, distention, or inflammatory changes. Sigmoid diverticulosis. Vascular/Lymphatic: Aortic atherosclerosis. No enlarged abdominal or pelvic lymph nodes. Reproductive: Status post hysterectomy. Other: No abdominal wall hernia or abnormality. No abdominopelvic ascites. Minimal soft tissue stranding in the left retroperitoneum at the site of a previously identified hematoma,  since resolved. Musculoskeletal: No acute or significant osseous findings. IMPRESSION: 1. No acute noncontrast CT findings of the abdomen or pelvis to explain nausea, vomiting, or weight loss. 2. Minimal soft tissue stranding in the left retroperitoneum at the site of a previously identified hematoma, since resolved. 3. Sigmoid diverticulosis without evidence of acute diverticulitis. Aortic Atherosclerosis (ICD10-I70.0). Electronically Signed   By: Eddie Candle M.D.   On: 10/03/2020 14:35   DG Chest 1 View  Result Date: 10/03/2020 CLINICAL DATA:  Nausea, abdominal pain EXAM: CHEST  1 VIEW COMPARISON:  05/21/2019 and prior FINDINGS: Left chest wall pacing device. Patchy and confluent left basilar/retrocardiac opacities. No pneumothorax or pleural effusion. Cardiomegaly. Postsurgical appearance of the cardiomediastinal silhouette. Multilevel spondylosis. IMPRESSION: Left basilar/retrocardiac opacities. Differential includes atelectasis, edema or infection. Stable cardiomegaly. Safety pin overlies the right neck soft tissues. Correlate with patient history/physical exam. Electronically Signed   By: Primitivo Gauze M.D.   On: 10/03/2020 14:53   US ABDOMEN LIMITED RUQ (LIVER/GB)  Result Date: 10/03/2020 CLINICAL DATA:  Nausea for 3 weeks. EXAM: ULTRASOUND ABDOMEN LIMITED RIGHT UPPER QUADRANT COMPARISON:  Abdomen and pelvis CT, 05/19/2019. FINDINGS: Gallbladder: No gallstones or wall thickening visualized. No sonographic Murphy sign noted by sonographer. Common bile duct: Diameter: 5 mm Liver: Somewhat coarse and mildly increased  liver parenchymal echogenicity. Normal liver size. No mass or focal lesion. Portal vein is patent on color Doppler imaging with normal direction of blood flow towards the liver. Other: None. IMPRESSION: 1. No acute findings.  Normal gallbladder.  No bile duct dilation. 2. Coarsened liver echotexture with mildly increased overall echogenicity. This is nonspecific. It may reflect mild  hepatic steatosis. Consider cirrhosis if there are consistent clinical findings. Electronically Signed   By: Lajean Manes M.D.   On: 10/03/2020 15:18      Subjective:  Patient seen and examined.  Daughter at bedside.  Patient tells me that she is doing fine and denies any complaints including nausea, vomiting, abdominal pain, chest pain, shortness of breath, leg swelling, orthopnea, PND, fever or chills.  Wishes to go home today.  Discharge Exam: Vitals:   10/04/20 1001 10/04/20 2353  BP: (!) 87/75 (!) 154/50  Pulse: 66 62  Resp: 18 16  Temp: 99.2 F (37.3 C) 99.2 F (37.3 C)  SpO2: 100% 98%   Vitals:   10/04/20 0846 10/04/20 1001 10/04/20 2353 10/05/20 0500  BP: (!) 159/94 (!) 87/75 (!) 154/50   Pulse: 66 66 62   Resp: 17 18 16    Temp: 98.3 F (36.8 C) 99.2 F (37.3 C) 99.2 F (37.3 C)   TempSrc: Temporal Oral Oral   SpO2: 100% 100% 98%   Weight:  59.1 kg  59.7 kg  Height:  5\' 1"  (1.549 m)      General: Pt is alert, awake, not in acute distress, elderly, on room air, communicating well Cardiovascular: RRR, S1/S2 +, no rubs, no gallops Respiratory: CTA bilaterally, no wheezing, no rhonchi Abdominal: Soft, NT, ND, bowel sounds + Extremities: no edema, no cyanosis    The results of significant diagnostics from this hospitalization (including imaging, microbiology, ancillary and laboratory) are listed below for reference.     Microbiology: Recent Results (from the past 240 hour(s))  Resp Panel by RT-PCR (Flu A&B, Covid) Nasopharyngeal Swab     Status: None   Collection Time: 10/03/20  4:50 PM   Specimen: Nasopharyngeal Swab; Nasopharyngeal(NP) swabs in vial transport medium  Result Value Ref Range Status   SARS Coronavirus 2 by RT PCR NEGATIVE NEGATIVE Final    Comment: (NOTE) SARS-CoV-2 target nucleic acids are NOT DETECTED.  The SARS-CoV-2 RNA is generally detectable in upper respiratory specimens during the acute phase of infection. The lowest concentration  of SARS-CoV-2 viral copies this assay can detect is 138 copies/mL. A negative result does not preclude SARS-Cov-2 infection and should not be used as the sole basis for treatment or other patient management decisions. A negative result may occur with  improper specimen collection/handling, submission of specimen other than nasopharyngeal swab, presence of viral mutation(s) within the areas targeted by this assay, and inadequate number of viral copies(<138 copies/mL). A negative result must be combined with clinical observations, patient history, and epidemiological information. The expected result is Negative.  Fact Sheet for Patients:  EntrepreneurPulse.com.au  Fact Sheet for Healthcare Providers:  IncredibleEmployment.be  This test is no t yet approved or cleared by the [place] FDA and  has been authorized for detection and/or diagnosis of SARS-CoV-2 by FDA under an Emergency Use Authorization (EUA). This EUA will remain  in effect (meaning this test can be used) for the duration of the COVID-19 declaration under Section 564(b)(1) of the Act, 21 U.S.C.section 360bbb-3(b)(1), unless the authorization is terminated  or revoked sooner.       Influenza A by  PCR NEGATIVE NEGATIVE Final   Influenza B by PCR NEGATIVE NEGATIVE Final    Comment: (NOTE) The Xpert Xpress SARS-CoV-2/FLU/RSV plus assay is intended as an aid in the diagnosis of influenza from Nasopharyngeal swab specimens and should not be used as a sole basis for treatment. Nasal washings and aspirates are unacceptable for Xpert Xpress SARS-CoV-2/FLU/RSV testing.  Fact Sheet for Patients: EntrepreneurPulse.com.au  Fact Sheet for Healthcare Providers: IncredibleEmployment.be  This test is not yet approved or cleared by the [place] FDA and has been authorized for detection and/or diagnosis of SARS-CoV-2 by FDA under an Emergency Use  Authorization (EUA). This EUA will remain in effect (meaning this test can be used) for the duration of the COVID-19 declaration under Section 564(b)(1) of the Act, 21 U.S.C. section 360bbb-3(b)(1), unless the authorization is terminated or revoked.  Performed at [facility], [place] [address], [place],  [address]      Labs: BNP (last 3 results) Recent Labs    01/04/20 1640  BNP 314.9*   Basic Metabolic Panel: Recent Labs  Lab 10/03/20 1310 10/04/20 0629 10/05/20 0434  NA 133* 140 142  K 4.0 3.9 4.2  CL 96* 106 109  CO2 21* 23 23  GLUCOSE 296* 117* 122*  BUN 93* 78* 62*  CREATININE 2.82* 2.48* 2.33*  CALCIUM 9.1 8.4* 8.5*  MG  --   --  2.0   Liver Function Tests: Recent Labs  Lab 10/03/20 1310 10/05/20 0434  AST 26 30  ALT 17 19  ALKPHOS 75 56  BILITOT 0.7 0.7  PROT 6.9 5.7*  ALBUMIN 3.5 2.7*   Recent Labs  Lab 10/03/20 1310  LIPASE 82*   No results for input(s): AMMONIA in the last 168 hours. CBC: Recent Labs  Lab 10/03/20 1310 10/04/20 0629 10/05/20 0434  WBC 8.0 6.9 7.4  HGB 10.5* 9.0* 8.8*  HCT 30.2* 27.2* 26.3*  MCV 98.1 101.1* 99.6  PLT 185 152 148*   Cardiac Enzymes: No results for input(s): CKTOTAL, CKMB, CKMBINDEX, TROPONINI in the last 168 hours. BNP: Invalid input(s): POCBNP CBG: Recent Labs  Lab 10/04/20 1201 10/04/20 1710 10/04/20 2224 10/05/20 0608 10/05/20 1212  GLUCAP 156* 176* 176* 102* 297*   D-Dimer No results for input(s): DDIMER in the last 72 hours. Hgb A1c Recent Labs    10/04/20 0629  HGBA1C 8.6*   Lipid Profile No results for input(s): CHOL, HDL, LDLCALC, TRIG, CHOLHDL, LDLDIRECT in the last 72 hours. Thyroid function studies Recent Labs    10/04/20 0629  TSH 0.237*   Anemia work up No results for input(s): VITAMINB12, FOLATE, FERRITIN, TIBC, IRON, RETICCTPCT in the last 72 hours. Urinalysis    Component Value Date/Time   COLORURINE YELLOW 10/03/2020 1847   APPEARANCEUR CLEAR  10/03/2020 1847   LABSPEC 1.010 10/03/2020 1847   PHURINE 5.0 10/03/2020 1847   GLUCOSEU NEGATIVE 10/03/2020 1847   HGBUR NEGATIVE 10/03/2020 1847   BILIRUBINUR NEGATIVE 10/03/2020 1847   KETONESUR NEGATIVE 10/03/2020 1847   PROTEINUR NEGATIVE 10/03/2020 1847   UROBILINOGEN 0.2 01/04/2012 1105   NITRITE NEGATIVE 10/03/2020 1847   LEUKOCYTESUR NEGATIVE 10/03/2020 1847   Sepsis Labs Invalid input(s): PROCALCITONIN,  WBC,  LACTICIDVEN Microbiology Recent Results (from the past 240 hour(s))  Resp Panel by RT-PCR (Flu A&B, Covid) Nasopharyngeal Swab     Status: None   Collection Time: 10/03/20  4:50 PM   Specimen: Nasopharyngeal Swab; Nasopharyngeal(NP) swabs in vial transport medium  Result Value Ref Range Status   SARS Coronavirus  2 by RT PCR NEGATIVE NEGATIVE Final    Comment: (NOTE) SARS-CoV-2 target nucleic acids are NOT DETECTED.  The SARS-CoV-2 RNA is generally detectable in upper respiratory specimens during the acute phase of infection. The lowest concentration of SARS-CoV-2 viral copies this assay can detect is 138 copies/mL. A negative result does not preclude SARS-Cov-2 infection and should not be used as the sole basis for treatment or other patient management decisions. A negative result may occur with  improper specimen collection/handling, submission of specimen other than nasopharyngeal swab, presence of viral mutation(s) within the areas targeted by this assay, and inadequate number of viral copies(<138 copies/mL). A negative result must be combined with clinical observations, patient history, and epidemiological information. The expected result is Negative.  Fact Sheet for Patients:  EntrepreneurPulse.com.au  Fact Sheet for Healthcare Providers:  IncredibleEmployment.be  This test is no t yet approved or cleared by the [place] FDA and  has been authorized for detection and/or diagnosis of SARS-CoV-2 by FDA under an  Emergency Use Authorization (EUA). This EUA will remain  in effect (meaning this test can be used) for the duration of the COVID-19 declaration under Section 564(b)(1) of the Act, 21 U.S.C.section 360bbb-3(b)(1), unless the authorization is terminated  or revoked sooner.       Influenza A by PCR NEGATIVE NEGATIVE Final   Influenza B by PCR NEGATIVE NEGATIVE Final    Comment: (NOTE) The Xpert Xpress SARS-CoV-2/FLU/RSV plus assay is intended as an aid in the diagnosis of influenza from Nasopharyngeal swab specimens and should not be used as a sole basis for treatment. Nasal washings and aspirates are unacceptable for Xpert Xpress SARS-CoV-2/FLU/RSV testing.  Fact Sheet for Patients: EntrepreneurPulse.com.au  Fact Sheet for Healthcare Providers: IncredibleEmployment.be  This test is not yet approved or cleared by the [place] FDA and has been authorized for detection and/or diagnosis of SARS-CoV-2 by FDA under an Emergency Use Authorization (EUA). This EUA will remain in effect (meaning this test can be used) for the duration of the COVID-19 declaration under Section 564(b)(1) of the Act, 21 U.S.C. section 360bbb-3(b)(1), unless the authorization is terminated or revoked.  Performed at [facility], [place] [address], [place], [place] [address]      Time coordinating discharge: Over 30 minutes  SIGNED:   Mckinley Jewel, MD  [facility] 10/05/2020, 12:40 PM Pager   If 7PM-7AM, please contact night-coverage [email or web address]

## 2020-10-05 NOTE — Evaluation (Signed)
Occupational Therapy Evaluation Patient Details Name: [name] MRN: [id number] DOB: 1941/04/12 Today's Date: 10/05/2020    History of Present Illness 79 yo female presented with fatigue, nausea, bouts of emesis, weight loss, lower abdominal pain, and poor appetite due to nausea. The epigastric abdominal pain has been present for ~1 month and worsens with eating. She displayed creatinine elevated up to 2.82 with BUN 93, suggesting a prerenal cause of symptoms. CT negative for acute findings in abdomen or pelvis to explain symptoms. PMH: DM2, tachy-brady syndrome, skin cancer, s/p CABG 12/04/11, presence of permanent cardiac pacemaker, paroxysmal atrial flutter, PAF, MI, mild vascular neurocognitive disorder, depression, hypothyroisidm, HTN, hiatal hernia, GERD, esophageal stricture, divurticulosis, CAD, CKD stage 3, CHF, arthritis, and anemia.   Clinical Impression   PT admitted with abdominal pain . Pt currently with functional limitiations due to the deficits listed below (see OT problem list). Pt currently with balance deficits and recommend family (A) for d/c planning due to high fall risk. Pt falling to the R during session. Pt noted to have what appears to be a leg length difference but when asked denies. Question if pt normally wears a lift to her shoes to help with balance.  Pt will benefit from skilled OT to increase their independence and safety with adls and balance to allow discharge [place].     Follow Up Recommendations  Home health OT    Equipment Recommendations  Other (comment) (RW)    Recommendations for Other Services       Precautions / Restrictions Precautions Precautions: Fall      Mobility Bed Mobility Overal bed mobility: Needs Assistance Bed Mobility: Supine to Sit     Supine to sit: Min guard     General bed mobility comments: pt getting to the eob and then falls back onto the bed surface. pt unable to catch herself. pt able to progress again to the eob  with increased time.    Transfers Overall transfer level: Needs assistance Equipment used: Rolling walker (2 wheeled) Transfers: Sit to/from Stand Sit to Stand: Min guard         General transfer comment: pt requires increased time and guarding due to R side LOB even with RW. pt increased fall risk with transfers    Balance Overall balance assessment: Mild deficits observed, not formally tested Sitting-balance support: Bilateral upper extremity supported;Feet supported Sitting balance-Leahy Scale: Fair     Standing balance support: During functional activity;Single extremity supported Standing balance-Leahy Scale: Fair                             ADL either performed or assessed with clinical judgement   ADL Overall ADL's : Needs assistance/impaired Eating/Feeding: Modified independent   Grooming: Wash/dry face;Wash/dry hands;Oral care;Brushing hair;Min guard Grooming Details (indicate cue type and reason): pt with LOB x1 toward the R side. pt needs cues for RW placement. pt sitting to complete LOB washing. Upper Body Bathing: Min guard;Standing   Lower Body Bathing: Min guard;Sit to/from stand   Upper Body Dressing : Supervision/safety   Lower Body Dressing: Min guard;Sit to/from stand               Functional mobility during ADLs: Min guard;Rolling walker General ADL Comments: pt demonstrates cognitive and balance deficits     Vision         Perception     Praxis      Pertinent Vitals/Pain Pain Assessment: No/denies pain  Hand Dominance Right   Extremity/Trunk Assessment Upper Extremity Assessment Upper Extremity Assessment: Overall WFL for tasks assessed   Lower Extremity Assessment Lower Extremity Assessment: Defer to PT evaluation LLE Deficits / Details: LLE appears to be different lengtht than R LE. noted to have knee surgery on this leg   Cervical / Trunk Assessment Cervical / Trunk Assessment: Kyphotic   Communication  Communication Communication: HOH   Cognition Arousal/Alertness: Awake/alert Behavior During Therapy: WFL for tasks assessed/performed Overall Cognitive Status: Impaired/Different from baseline Area of Impairment: Orientation;Memory;Following commands;Safety/judgement;Awareness;Problem solving                 Orientation Level: Disoriented to;Time   Memory: Decreased short-term memory Following Commands: Follows one step commands consistently;Follows multi-step commands with increased time Safety/Judgement: Decreased awareness of safety;Decreased awareness of deficits Awareness: Emergent Problem Solving: Slow processing General Comments: pt with LOB at sink and when questioned about balance pt self reports no balance deficits at all. pt walking into objects with RW and does not self correct initially and just continues to walk into the object. pt states "not being seen by a doctor the entire time she has been here"   General Comments  pt inspecting skin and perseverating on a red area on L arm just below IV site    Exercises     Shoulder Instructions      Home Living Family/patient expects to be discharged to:: Private residence Living Arrangements: Alone Available Help at Discharge: Family;Available 24 hours/day Type of Home: House Home Access: Stairs to enter [employer] of Steps: 3 Entrance Stairs-Rails: None Home Layout: One level     Bathroom Shower/Tub: Occupational psychologist: Standard Bathroom Accessibility: Yes   Home Equipment: Bedside commode;Walker - 2 wheels;Cane - quad;Cane - single point;Shower seat;Hand held shower head   Additional Comments: Mechnical adjustable bed wit rail attached to R head of bed      Prior Functioning/Environment Level of Independence: Independent with assistive device(s)        Comments: In the home, majority of days she does not utilize an AD/AE for mobility but if weak she uses RW. When in community,  she uses SPC. Intermittently gets assistance for showers from daughter or another friend if weak.        OT Problem List: Decreased activity tolerance;Impaired balance (sitting and/or standing);Decreased cognition;Decreased safety awareness;Decreased knowledge of use of DME or AE;Decreased knowledge of precautions      OT Treatment/Interventions: Self-care/ADL training;Therapeutic exercise;Energy conservation;DME and/or AE instruction;Manual therapy;Modalities;Therapeutic activities;Cognitive remediation/compensation;Patient/family education;Balance training    OT Goals(Current goals can be found in the care plan section) Acute Rehab OT Goals Patient Stated Goal: to go home today and see the doctor OT Goal Formulation: With patient Time For Goal Achievement: 10/19/20 Potential to Achieve Goals: Good  OT Frequency: Min 2X/week   Barriers to D/C: Decreased caregiver support  lives alone. uncertain no family present to (A) level for d/c       Co-evaluation              AM-PAC OT "6 Clicks" Daily Activity     Outcome Measure Help from another person eating meals?: None Help from another person taking care of personal grooming?: A Little Help from another person toileting, which includes using toliet, bedpan, or urinal?: A Little Help from another person bathing (including washing, rinsing, drying)?: A Little Help from another person to put on and taking off regular upper body clothing?: A Little Help from another person  to put on and taking off regular lower body clothing?: A Little 6 Click Score: 19   End of Session Equipment Utilized During Treatment: Rolling walker Nurse Communication: Mobility status;Precautions  Activity Tolerance: Patient tolerated treatment well Patient left: in chair;with call bell/phone within reach;with chair alarm set  OT Visit Diagnosis: Unsteadiness on feet (R26.81)                Time: 7262-0355 OT Time Calculation (min): 31 min Charges:  OT  General Charges $OT Visit: 1 Visit OT Evaluation $OT Eval Moderate Complexity: 1 Mod OT Treatments $Self Care/Home Management : 8-22 mins   Brynn, OTR/L  Acute Rehabilitation Services Pager: [phone] Office: [phone] .   Jeri Modena 10/05/2020, 9:08 AM

## 2020-10-05 NOTE — Discharge Instructions (Signed)

## 2020-10-05 NOTE — Progress Notes (Signed)
Nutrition Education Note  RD consulted for nutrition education regarding heart healthy diabetes diet.  Lab Results  Component Value Date   HGBA1C 8.6 (H) 10/04/2020     "Heart Healthy, Consistent Carbohydrate Nutrition Therapy" handout from the [employer] of [employer] attached to discharge instructions. Spoke with patient's daughter on the phone to review diet recommendations. Patient has been eating poorly recently, so she has not been restricting her diet much. She tries to follow a low sodium diet per the advice of her nephrologist. She doesn't add salt to anything. Discussed with daughter ways to increase intake while still following a heart healthy, carbohydrate modified diet. Encouraged small, frequent meals/snacks. Provided daughter with RD contact information for any future questions.  Body mass index is 24.88 kg/m. Pt meets criteria for normal weight based on current BMI.  Patient is being discharged home today. Labs and medications reviewed. No further nutrition interventions warranted at this time. If additional nutrition issues arise, please re-consult RD.   Lucas Mallow, RD, LDN, CNSC Please refer to [facility] for contact information.

## 2020-10-06 ENCOUNTER — Ambulatory Visit: Payer: Medicare Other | Admitting: Medical

## 2020-10-06 ENCOUNTER — Encounter: Payer: Self-pay | Admitting: Medical

## 2020-10-06 ENCOUNTER — Other Ambulatory Visit: Payer: Self-pay

## 2020-10-06 VITALS — BP 106/52 | HR 63 | Ht 61.0 in | Wt 133.6 lb

## 2020-10-06 DIAGNOSIS — Z950 Presence of cardiac pacemaker: Secondary | ICD-10-CM

## 2020-10-06 DIAGNOSIS — I4892 Unspecified atrial flutter: Secondary | ICD-10-CM

## 2020-10-06 DIAGNOSIS — I10 Essential (primary) hypertension: Secondary | ICD-10-CM

## 2020-10-06 DIAGNOSIS — N184 Chronic kidney disease, stage 4 (severe): Secondary | ICD-10-CM

## 2020-10-06 DIAGNOSIS — I5032 Chronic diastolic (congestive) heart failure: Secondary | ICD-10-CM

## 2020-10-06 DIAGNOSIS — I25709 Atherosclerosis of coronary artery bypass graft(s), unspecified, with unspecified angina pectoris: Secondary | ICD-10-CM | POA: Diagnosis not present

## 2020-10-06 NOTE — Patient Instructions (Signed)
Medication Instructions:  Your physician recommends that you continue on your current medications as directed. Please refer to the Current Medication list given to you today.  *If you need a refill on your cardiac medications before your next appointment, please call your pharmacy*  Lab Work: NONE ordered at this time of appointment   If you have labs (blood work) drawn today and your tests are completely normal, you will receive your results only by: MyChart Message (if you have MyChart) OR A paper copy in the mail If you have any lab test that is abnormal or we need to change your treatment, we will call you to review the results.  Testing/Procedures: NONE ordered at this time of appointment   Follow-Up: At [facility], you and your health needs are our priority.  As part of our continuing mission to provide you with exceptional heart care, we have created designated Provider Care Teams.  These Care Teams include your primary Cardiologist (physician) and Advanced Practice Providers (APPs -  Physician Assistants and Nurse Practitioners) who all work together to provide you with the care you need, when you need it.   Your next appointment:   6 month(s)  The format for your next appointment:   In Person  Provider:   K. Chad Hilty, MD  Other Instructions   

## 2020-10-27 ENCOUNTER — Other Ambulatory Visit: Payer: Self-pay | Admitting: Internal Medicine

## 2020-10-30 DIAGNOSIS — K449 Diaphragmatic hernia without obstruction or gangrene: Secondary | ICD-10-CM | POA: Diagnosis not present

## 2020-10-30 DIAGNOSIS — Z950 Presence of cardiac pacemaker: Secondary | ICD-10-CM | POA: Diagnosis not present

## 2020-10-30 DIAGNOSIS — E785 Hyperlipidemia, unspecified: Secondary | ICD-10-CM | POA: Diagnosis not present

## 2020-10-30 DIAGNOSIS — Z9181 History of falling: Secondary | ICD-10-CM | POA: Diagnosis not present

## 2020-10-30 DIAGNOSIS — I252 Old myocardial infarction: Secondary | ICD-10-CM | POA: Diagnosis not present

## 2020-10-30 DIAGNOSIS — R627 Adult failure to thrive: Secondary | ICD-10-CM | POA: Diagnosis not present

## 2020-10-30 DIAGNOSIS — K2100 Gastro-esophageal reflux disease with esophagitis, without bleeding: Secondary | ICD-10-CM | POA: Diagnosis not present

## 2020-10-30 DIAGNOSIS — Z7982 Long term (current) use of aspirin: Secondary | ICD-10-CM | POA: Diagnosis not present

## 2020-10-30 DIAGNOSIS — E039 Hypothyroidism, unspecified: Secondary | ICD-10-CM | POA: Diagnosis not present

## 2020-10-30 DIAGNOSIS — D631 Anemia in chronic kidney disease: Secondary | ICD-10-CM | POA: Diagnosis not present

## 2020-10-30 DIAGNOSIS — Z794 Long term (current) use of insulin: Secondary | ICD-10-CM | POA: Diagnosis not present

## 2020-10-30 DIAGNOSIS — M1991 Primary osteoarthritis, unspecified site: Secondary | ICD-10-CM | POA: Diagnosis not present

## 2020-10-30 DIAGNOSIS — Z951 Presence of aortocoronary bypass graft: Secondary | ICD-10-CM | POA: Diagnosis not present

## 2020-10-30 DIAGNOSIS — I2510 Atherosclerotic heart disease of native coronary artery without angina pectoris: Secondary | ICD-10-CM | POA: Diagnosis not present

## 2020-10-30 DIAGNOSIS — Z95818 Presence of other cardiac implants and grafts: Secondary | ICD-10-CM | POA: Diagnosis not present

## 2020-10-30 DIAGNOSIS — I5032 Chronic diastolic (congestive) heart failure: Secondary | ICD-10-CM | POA: Diagnosis not present

## 2020-10-30 DIAGNOSIS — N184 Chronic kidney disease, stage 4 (severe): Secondary | ICD-10-CM | POA: Diagnosis not present

## 2020-10-30 DIAGNOSIS — I495 Sick sinus syndrome: Secondary | ICD-10-CM | POA: Diagnosis not present

## 2020-10-30 DIAGNOSIS — E1122 Type 2 diabetes mellitus with diabetic chronic kidney disease: Secondary | ICD-10-CM | POA: Diagnosis not present

## 2020-10-30 DIAGNOSIS — I480 Paroxysmal atrial fibrillation: Secondary | ICD-10-CM | POA: Diagnosis not present

## 2020-10-30 DIAGNOSIS — I130 Hypertensive heart and chronic kidney disease with heart failure and stage 1 through stage 4 chronic kidney disease, or unspecified chronic kidney disease: Secondary | ICD-10-CM | POA: Diagnosis not present

## 2020-11-03 DIAGNOSIS — I480 Paroxysmal atrial fibrillation: Secondary | ICD-10-CM | POA: Diagnosis not present

## 2020-11-03 DIAGNOSIS — I252 Old myocardial infarction: Secondary | ICD-10-CM | POA: Diagnosis not present

## 2020-11-03 DIAGNOSIS — Z951 Presence of aortocoronary bypass graft: Secondary | ICD-10-CM | POA: Diagnosis not present

## 2020-11-03 DIAGNOSIS — K449 Diaphragmatic hernia without obstruction or gangrene: Secondary | ICD-10-CM | POA: Diagnosis not present

## 2020-11-03 DIAGNOSIS — I2510 Atherosclerotic heart disease of native coronary artery without angina pectoris: Secondary | ICD-10-CM | POA: Diagnosis not present

## 2020-11-03 DIAGNOSIS — Z9181 History of falling: Secondary | ICD-10-CM | POA: Diagnosis not present

## 2020-11-03 DIAGNOSIS — Z950 Presence of cardiac pacemaker: Secondary | ICD-10-CM | POA: Diagnosis not present

## 2020-11-03 DIAGNOSIS — M1991 Primary osteoarthritis, unspecified site: Secondary | ICD-10-CM | POA: Diagnosis not present

## 2020-11-03 DIAGNOSIS — D631 Anemia in chronic kidney disease: Secondary | ICD-10-CM | POA: Diagnosis not present

## 2020-11-03 DIAGNOSIS — K2100 Gastro-esophageal reflux disease with esophagitis, without bleeding: Secondary | ICD-10-CM | POA: Diagnosis not present

## 2020-11-03 DIAGNOSIS — Z7982 Long term (current) use of aspirin: Secondary | ICD-10-CM | POA: Diagnosis not present

## 2020-11-03 DIAGNOSIS — E039 Hypothyroidism, unspecified: Secondary | ICD-10-CM | POA: Diagnosis not present

## 2020-11-03 DIAGNOSIS — R627 Adult failure to thrive: Secondary | ICD-10-CM | POA: Diagnosis not present

## 2020-11-03 DIAGNOSIS — I5032 Chronic diastolic (congestive) heart failure: Secondary | ICD-10-CM | POA: Diagnosis not present

## 2020-11-03 DIAGNOSIS — Z95818 Presence of other cardiac implants and grafts: Secondary | ICD-10-CM | POA: Diagnosis not present

## 2020-11-03 DIAGNOSIS — I495 Sick sinus syndrome: Secondary | ICD-10-CM | POA: Diagnosis not present

## 2020-11-03 DIAGNOSIS — E785 Hyperlipidemia, unspecified: Secondary | ICD-10-CM | POA: Diagnosis not present

## 2020-11-03 DIAGNOSIS — Z794 Long term (current) use of insulin: Secondary | ICD-10-CM | POA: Diagnosis not present

## 2020-11-03 DIAGNOSIS — N184 Chronic kidney disease, stage 4 (severe): Secondary | ICD-10-CM | POA: Diagnosis not present

## 2020-11-03 DIAGNOSIS — E1122 Type 2 diabetes mellitus with diabetic chronic kidney disease: Secondary | ICD-10-CM | POA: Diagnosis not present

## 2020-11-03 DIAGNOSIS — I130 Hypertensive heart and chronic kidney disease with heart failure and stage 1 through stage 4 chronic kidney disease, or unspecified chronic kidney disease: Secondary | ICD-10-CM | POA: Diagnosis not present

## 2020-11-13 DIAGNOSIS — Z9181 History of falling: Secondary | ICD-10-CM | POA: Diagnosis not present

## 2020-11-13 DIAGNOSIS — I495 Sick sinus syndrome: Secondary | ICD-10-CM | POA: Diagnosis not present

## 2020-11-13 DIAGNOSIS — D631 Anemia in chronic kidney disease: Secondary | ICD-10-CM | POA: Diagnosis not present

## 2020-11-13 DIAGNOSIS — I252 Old myocardial infarction: Secondary | ICD-10-CM | POA: Diagnosis not present

## 2020-11-13 DIAGNOSIS — E039 Hypothyroidism, unspecified: Secondary | ICD-10-CM | POA: Diagnosis not present

## 2020-11-13 DIAGNOSIS — M1991 Primary osteoarthritis, unspecified site: Secondary | ICD-10-CM | POA: Diagnosis not present

## 2020-11-13 DIAGNOSIS — Z95818 Presence of other cardiac implants and grafts: Secondary | ICD-10-CM | POA: Diagnosis not present

## 2020-11-13 DIAGNOSIS — Z7982 Long term (current) use of aspirin: Secondary | ICD-10-CM | POA: Diagnosis not present

## 2020-11-13 DIAGNOSIS — I2510 Atherosclerotic heart disease of native coronary artery without angina pectoris: Secondary | ICD-10-CM | POA: Diagnosis not present

## 2020-11-13 DIAGNOSIS — Z951 Presence of aortocoronary bypass graft: Secondary | ICD-10-CM | POA: Diagnosis not present

## 2020-11-13 DIAGNOSIS — E1122 Type 2 diabetes mellitus with diabetic chronic kidney disease: Secondary | ICD-10-CM | POA: Diagnosis not present

## 2020-11-13 DIAGNOSIS — I480 Paroxysmal atrial fibrillation: Secondary | ICD-10-CM | POA: Diagnosis not present

## 2020-11-13 DIAGNOSIS — R627 Adult failure to thrive: Secondary | ICD-10-CM | POA: Diagnosis not present

## 2020-11-13 DIAGNOSIS — N184 Chronic kidney disease, stage 4 (severe): Secondary | ICD-10-CM | POA: Diagnosis not present

## 2020-11-13 DIAGNOSIS — K2100 Gastro-esophageal reflux disease with esophagitis, without bleeding: Secondary | ICD-10-CM | POA: Diagnosis not present

## 2020-11-13 DIAGNOSIS — I130 Hypertensive heart and chronic kidney disease with heart failure and stage 1 through stage 4 chronic kidney disease, or unspecified chronic kidney disease: Secondary | ICD-10-CM | POA: Diagnosis not present

## 2020-11-13 DIAGNOSIS — Z950 Presence of cardiac pacemaker: Secondary | ICD-10-CM | POA: Diagnosis not present

## 2020-11-13 DIAGNOSIS — E785 Hyperlipidemia, unspecified: Secondary | ICD-10-CM | POA: Diagnosis not present

## 2020-11-13 DIAGNOSIS — Z794 Long term (current) use of insulin: Secondary | ICD-10-CM | POA: Diagnosis not present

## 2020-11-13 DIAGNOSIS — I5032 Chronic diastolic (congestive) heart failure: Secondary | ICD-10-CM | POA: Diagnosis not present

## 2020-11-13 DIAGNOSIS — K449 Diaphragmatic hernia without obstruction or gangrene: Secondary | ICD-10-CM | POA: Diagnosis not present

## 2020-11-18 ENCOUNTER — Ambulatory Visit: Payer: Medicare Other

## 2020-11-19 LAB — CUP PACEART REMOTE DEVICE CHECK
Battery Remaining Longevity: 121 mo
Battery Remaining Percentage: 95.5 %
Battery Voltage: 2.99 V
Brady Statistic AP VP Percent: 1 %
Brady Statistic AP VS Percent: 99 %
Brady Statistic AS VP Percent: 1 %
Brady Statistic AS VS Percent: 1 %
Brady Statistic RA Percent Paced: 99 %
Brady Statistic RV Percent Paced: 1 %
Date Time Interrogation Session: 20220126061153
Implantable Lead Implant Date: 20171019
Implantable Lead Implant Date: 20171019
Implantable Lead Location: 753859
Implantable Lead Location: 753860
Implantable Pulse Generator Implant Date: 20171019
Lead Channel Impedance Value: 430 Ohm
Lead Channel Impedance Value: 800 Ohm
Lead Channel Pacing Threshold Amplitude: 0.5 V
Lead Channel Pacing Threshold Amplitude: 0.75 V
Lead Channel Pacing Threshold Pulse Width: 0.4 ms
Lead Channel Pacing Threshold Pulse Width: 0.4 ms
Lead Channel Sensing Intrinsic Amplitude: 12 mV
Lead Channel Sensing Intrinsic Amplitude: 2.6 mV
Lead Channel Setting Pacing Amplitude: 2 V
Lead Channel Setting Pacing Amplitude: 2.5 V
Lead Channel Setting Pacing Pulse Width: 0.4 ms
Lead Channel Setting Sensing Sensitivity: 2 mV
Pulse Gen Model: 2272
Pulse Gen Serial Number: 3180458

## 2020-11-21 DIAGNOSIS — Z794 Long term (current) use of insulin: Secondary | ICD-10-CM | POA: Diagnosis not present

## 2020-11-21 DIAGNOSIS — I480 Paroxysmal atrial fibrillation: Secondary | ICD-10-CM | POA: Diagnosis not present

## 2020-11-21 DIAGNOSIS — I2510 Atherosclerotic heart disease of native coronary artery without angina pectoris: Secondary | ICD-10-CM | POA: Diagnosis not present

## 2020-11-21 DIAGNOSIS — N184 Chronic kidney disease, stage 4 (severe): Secondary | ICD-10-CM | POA: Diagnosis not present

## 2020-11-21 DIAGNOSIS — Z7982 Long term (current) use of aspirin: Secondary | ICD-10-CM | POA: Diagnosis not present

## 2020-11-21 DIAGNOSIS — D631 Anemia in chronic kidney disease: Secondary | ICD-10-CM | POA: Diagnosis not present

## 2020-11-21 DIAGNOSIS — I252 Old myocardial infarction: Secondary | ICD-10-CM | POA: Diagnosis not present

## 2020-11-21 DIAGNOSIS — Z950 Presence of cardiac pacemaker: Secondary | ICD-10-CM | POA: Diagnosis not present

## 2020-11-21 DIAGNOSIS — Z9181 History of falling: Secondary | ICD-10-CM | POA: Diagnosis not present

## 2020-11-21 DIAGNOSIS — E039 Hypothyroidism, unspecified: Secondary | ICD-10-CM | POA: Diagnosis not present

## 2020-11-21 DIAGNOSIS — I5032 Chronic diastolic (congestive) heart failure: Secondary | ICD-10-CM | POA: Diagnosis not present

## 2020-11-21 DIAGNOSIS — R627 Adult failure to thrive: Secondary | ICD-10-CM | POA: Diagnosis not present

## 2020-11-21 DIAGNOSIS — M1991 Primary osteoarthritis, unspecified site: Secondary | ICD-10-CM | POA: Diagnosis not present

## 2020-11-21 DIAGNOSIS — I130 Hypertensive heart and chronic kidney disease with heart failure and stage 1 through stage 4 chronic kidney disease, or unspecified chronic kidney disease: Secondary | ICD-10-CM | POA: Diagnosis not present

## 2020-11-21 DIAGNOSIS — I495 Sick sinus syndrome: Secondary | ICD-10-CM | POA: Diagnosis not present

## 2020-11-21 DIAGNOSIS — K449 Diaphragmatic hernia without obstruction or gangrene: Secondary | ICD-10-CM | POA: Diagnosis not present

## 2020-11-21 DIAGNOSIS — K2100 Gastro-esophageal reflux disease with esophagitis, without bleeding: Secondary | ICD-10-CM | POA: Diagnosis not present

## 2020-11-21 DIAGNOSIS — E1122 Type 2 diabetes mellitus with diabetic chronic kidney disease: Secondary | ICD-10-CM | POA: Diagnosis not present

## 2020-11-21 DIAGNOSIS — E785 Hyperlipidemia, unspecified: Secondary | ICD-10-CM | POA: Diagnosis not present

## 2020-11-21 DIAGNOSIS — Z951 Presence of aortocoronary bypass graft: Secondary | ICD-10-CM | POA: Diagnosis not present

## 2020-11-21 DIAGNOSIS — Z95818 Presence of other cardiac implants and grafts: Secondary | ICD-10-CM | POA: Diagnosis not present

## 2020-11-24 DIAGNOSIS — N184 Chronic kidney disease, stage 4 (severe): Secondary | ICD-10-CM | POA: Diagnosis not present

## 2020-11-24 DIAGNOSIS — K2100 Gastro-esophageal reflux disease with esophagitis, without bleeding: Secondary | ICD-10-CM | POA: Diagnosis not present

## 2020-11-24 DIAGNOSIS — I480 Paroxysmal atrial fibrillation: Secondary | ICD-10-CM | POA: Diagnosis not present

## 2020-11-24 DIAGNOSIS — I252 Old myocardial infarction: Secondary | ICD-10-CM | POA: Diagnosis not present

## 2020-11-24 DIAGNOSIS — R627 Adult failure to thrive: Secondary | ICD-10-CM | POA: Diagnosis not present

## 2020-11-24 DIAGNOSIS — Z950 Presence of cardiac pacemaker: Secondary | ICD-10-CM | POA: Diagnosis not present

## 2020-11-24 DIAGNOSIS — I495 Sick sinus syndrome: Secondary | ICD-10-CM | POA: Diagnosis not present

## 2020-11-24 DIAGNOSIS — Z9181 History of falling: Secondary | ICD-10-CM | POA: Diagnosis not present

## 2020-11-24 DIAGNOSIS — E785 Hyperlipidemia, unspecified: Secondary | ICD-10-CM | POA: Diagnosis not present

## 2020-11-24 DIAGNOSIS — I130 Hypertensive heart and chronic kidney disease with heart failure and stage 1 through stage 4 chronic kidney disease, or unspecified chronic kidney disease: Secondary | ICD-10-CM | POA: Diagnosis not present

## 2020-11-24 DIAGNOSIS — I2510 Atherosclerotic heart disease of native coronary artery without angina pectoris: Secondary | ICD-10-CM | POA: Diagnosis not present

## 2020-11-24 DIAGNOSIS — I5032 Chronic diastolic (congestive) heart failure: Secondary | ICD-10-CM | POA: Diagnosis not present

## 2020-11-24 DIAGNOSIS — Z951 Presence of aortocoronary bypass graft: Secondary | ICD-10-CM | POA: Diagnosis not present

## 2020-11-24 DIAGNOSIS — Z794 Long term (current) use of insulin: Secondary | ICD-10-CM | POA: Diagnosis not present

## 2020-11-24 DIAGNOSIS — E039 Hypothyroidism, unspecified: Secondary | ICD-10-CM | POA: Diagnosis not present

## 2020-11-24 DIAGNOSIS — K449 Diaphragmatic hernia without obstruction or gangrene: Secondary | ICD-10-CM | POA: Diagnosis not present

## 2020-11-24 DIAGNOSIS — E1122 Type 2 diabetes mellitus with diabetic chronic kidney disease: Secondary | ICD-10-CM | POA: Diagnosis not present

## 2020-11-24 DIAGNOSIS — D631 Anemia in chronic kidney disease: Secondary | ICD-10-CM | POA: Diagnosis not present

## 2020-11-24 DIAGNOSIS — Z7982 Long term (current) use of aspirin: Secondary | ICD-10-CM | POA: Diagnosis not present

## 2020-11-24 DIAGNOSIS — Z95818 Presence of other cardiac implants and grafts: Secondary | ICD-10-CM | POA: Diagnosis not present

## 2020-11-24 DIAGNOSIS — M1991 Primary osteoarthritis, unspecified site: Secondary | ICD-10-CM | POA: Diagnosis not present

## 2020-11-27 DIAGNOSIS — Z7982 Long term (current) use of aspirin: Secondary | ICD-10-CM | POA: Diagnosis not present

## 2020-11-27 DIAGNOSIS — D631 Anemia in chronic kidney disease: Secondary | ICD-10-CM | POA: Diagnosis not present

## 2020-11-27 DIAGNOSIS — K2100 Gastro-esophageal reflux disease with esophagitis, without bleeding: Secondary | ICD-10-CM | POA: Diagnosis not present

## 2020-11-27 DIAGNOSIS — E1122 Type 2 diabetes mellitus with diabetic chronic kidney disease: Secondary | ICD-10-CM | POA: Diagnosis not present

## 2020-11-27 DIAGNOSIS — N184 Chronic kidney disease, stage 4 (severe): Secondary | ICD-10-CM | POA: Diagnosis not present

## 2020-11-27 DIAGNOSIS — Z95818 Presence of other cardiac implants and grafts: Secondary | ICD-10-CM | POA: Diagnosis not present

## 2020-11-27 DIAGNOSIS — I2510 Atherosclerotic heart disease of native coronary artery without angina pectoris: Secondary | ICD-10-CM | POA: Diagnosis not present

## 2020-11-27 DIAGNOSIS — Z9181 History of falling: Secondary | ICD-10-CM | POA: Diagnosis not present

## 2020-11-27 DIAGNOSIS — I130 Hypertensive heart and chronic kidney disease with heart failure and stage 1 through stage 4 chronic kidney disease, or unspecified chronic kidney disease: Secondary | ICD-10-CM | POA: Diagnosis not present

## 2020-11-27 DIAGNOSIS — R627 Adult failure to thrive: Secondary | ICD-10-CM | POA: Diagnosis not present

## 2020-11-27 DIAGNOSIS — E039 Hypothyroidism, unspecified: Secondary | ICD-10-CM | POA: Diagnosis not present

## 2020-11-27 DIAGNOSIS — I495 Sick sinus syndrome: Secondary | ICD-10-CM | POA: Diagnosis not present

## 2020-11-27 DIAGNOSIS — E785 Hyperlipidemia, unspecified: Secondary | ICD-10-CM | POA: Diagnosis not present

## 2020-11-27 DIAGNOSIS — K449 Diaphragmatic hernia without obstruction or gangrene: Secondary | ICD-10-CM | POA: Diagnosis not present

## 2020-11-27 DIAGNOSIS — Z950 Presence of cardiac pacemaker: Secondary | ICD-10-CM | POA: Diagnosis not present

## 2020-11-27 DIAGNOSIS — M1991 Primary osteoarthritis, unspecified site: Secondary | ICD-10-CM | POA: Diagnosis not present

## 2020-11-27 DIAGNOSIS — I252 Old myocardial infarction: Secondary | ICD-10-CM | POA: Diagnosis not present

## 2020-11-27 DIAGNOSIS — Z951 Presence of aortocoronary bypass graft: Secondary | ICD-10-CM | POA: Diagnosis not present

## 2020-11-27 DIAGNOSIS — I480 Paroxysmal atrial fibrillation: Secondary | ICD-10-CM | POA: Diagnosis not present

## 2020-11-27 DIAGNOSIS — Z794 Long term (current) use of insulin: Secondary | ICD-10-CM | POA: Diagnosis not present

## 2020-11-27 DIAGNOSIS — I5032 Chronic diastolic (congestive) heart failure: Secondary | ICD-10-CM | POA: Diagnosis not present

## 2020-11-28 DIAGNOSIS — D509 Iron deficiency anemia, unspecified: Secondary | ICD-10-CM | POA: Diagnosis not present

## 2020-11-28 DIAGNOSIS — E875 Hyperkalemia: Secondary | ICD-10-CM | POA: Diagnosis not present

## 2020-11-28 DIAGNOSIS — N183 Chronic kidney disease, stage 3 unspecified: Secondary | ICD-10-CM | POA: Diagnosis not present

## 2020-11-28 DIAGNOSIS — D631 Anemia in chronic kidney disease: Secondary | ICD-10-CM | POA: Diagnosis not present

## 2020-11-28 DIAGNOSIS — N179 Acute kidney failure, unspecified: Secondary | ICD-10-CM | POA: Diagnosis not present

## 2020-11-28 DIAGNOSIS — I129 Hypertensive chronic kidney disease with stage 1 through stage 4 chronic kidney disease, or unspecified chronic kidney disease: Secondary | ICD-10-CM | POA: Diagnosis not present

## 2020-11-28 LAB — IRON,TIBC AND FERRITIN PANEL
%SAT: 21
Ferritin: 469
Iron: 48
TIBC: 231
UIBC: 184

## 2020-11-28 LAB — BASIC METABOLIC PANEL
BUN: 61 — AB (ref 4–21)
CO2: 25 — AB (ref 13–22)
Chloride: 104 (ref 99–108)
Creatinine: 2 — AB (ref 0.5–1.1)
Glucose: 195
Potassium: 4.5 (ref 3.4–5.3)
Sodium: 140 (ref 137–147)

## 2020-11-28 LAB — COMPREHENSIVE METABOLIC PANEL
Albumin: 4.1 (ref 3.5–5.0)
Calcium: 9.3 (ref 8.7–10.7)
GFR calc Af Amer: 26
GFR calc non Af Amer: 23

## 2020-11-28 LAB — TSH: TSH: 3.37 (ref 0.41–5.90)

## 2020-11-28 LAB — CBC AND DIFFERENTIAL: Hemoglobin: 10.3 — AB (ref 12.0–16.0)

## 2020-12-02 ENCOUNTER — Encounter: Payer: Self-pay | Admitting: Internal Medicine

## 2020-12-02 DIAGNOSIS — Z951 Presence of aortocoronary bypass graft: Secondary | ICD-10-CM | POA: Diagnosis not present

## 2020-12-02 DIAGNOSIS — E039 Hypothyroidism, unspecified: Secondary | ICD-10-CM | POA: Diagnosis not present

## 2020-12-02 DIAGNOSIS — Z950 Presence of cardiac pacemaker: Secondary | ICD-10-CM | POA: Diagnosis not present

## 2020-12-02 DIAGNOSIS — I495 Sick sinus syndrome: Secondary | ICD-10-CM | POA: Diagnosis not present

## 2020-12-02 DIAGNOSIS — M1991 Primary osteoarthritis, unspecified site: Secondary | ICD-10-CM | POA: Diagnosis not present

## 2020-12-02 DIAGNOSIS — K449 Diaphragmatic hernia without obstruction or gangrene: Secondary | ICD-10-CM | POA: Diagnosis not present

## 2020-12-02 DIAGNOSIS — E1122 Type 2 diabetes mellitus with diabetic chronic kidney disease: Secondary | ICD-10-CM | POA: Diagnosis not present

## 2020-12-02 DIAGNOSIS — K2100 Gastro-esophageal reflux disease with esophagitis, without bleeding: Secondary | ICD-10-CM | POA: Diagnosis not present

## 2020-12-02 DIAGNOSIS — Z95818 Presence of other cardiac implants and grafts: Secondary | ICD-10-CM | POA: Diagnosis not present

## 2020-12-02 DIAGNOSIS — E785 Hyperlipidemia, unspecified: Secondary | ICD-10-CM | POA: Diagnosis not present

## 2020-12-02 DIAGNOSIS — Z794 Long term (current) use of insulin: Secondary | ICD-10-CM | POA: Diagnosis not present

## 2020-12-02 DIAGNOSIS — I5032 Chronic diastolic (congestive) heart failure: Secondary | ICD-10-CM | POA: Diagnosis not present

## 2020-12-02 DIAGNOSIS — Z7982 Long term (current) use of aspirin: Secondary | ICD-10-CM | POA: Diagnosis not present

## 2020-12-02 DIAGNOSIS — I130 Hypertensive heart and chronic kidney disease with heart failure and stage 1 through stage 4 chronic kidney disease, or unspecified chronic kidney disease: Secondary | ICD-10-CM | POA: Diagnosis not present

## 2020-12-02 DIAGNOSIS — D631 Anemia in chronic kidney disease: Secondary | ICD-10-CM | POA: Diagnosis not present

## 2020-12-02 DIAGNOSIS — R627 Adult failure to thrive: Secondary | ICD-10-CM | POA: Diagnosis not present

## 2020-12-02 DIAGNOSIS — Z9181 History of falling: Secondary | ICD-10-CM | POA: Diagnosis not present

## 2020-12-02 DIAGNOSIS — N184 Chronic kidney disease, stage 4 (severe): Secondary | ICD-10-CM | POA: Diagnosis not present

## 2020-12-02 DIAGNOSIS — I480 Paroxysmal atrial fibrillation: Secondary | ICD-10-CM | POA: Diagnosis not present

## 2020-12-02 DIAGNOSIS — I2510 Atherosclerotic heart disease of native coronary artery without angina pectoris: Secondary | ICD-10-CM | POA: Diagnosis not present

## 2020-12-02 DIAGNOSIS — I252 Old myocardial infarction: Secondary | ICD-10-CM | POA: Diagnosis not present

## 2020-12-03 DIAGNOSIS — K2100 Gastro-esophageal reflux disease with esophagitis, without bleeding: Secondary | ICD-10-CM | POA: Diagnosis not present

## 2020-12-03 DIAGNOSIS — I480 Paroxysmal atrial fibrillation: Secondary | ICD-10-CM | POA: Diagnosis not present

## 2020-12-03 DIAGNOSIS — Z95818 Presence of other cardiac implants and grafts: Secondary | ICD-10-CM | POA: Diagnosis not present

## 2020-12-03 DIAGNOSIS — Z950 Presence of cardiac pacemaker: Secondary | ICD-10-CM | POA: Diagnosis not present

## 2020-12-03 DIAGNOSIS — D631 Anemia in chronic kidney disease: Secondary | ICD-10-CM | POA: Diagnosis not present

## 2020-12-03 DIAGNOSIS — I495 Sick sinus syndrome: Secondary | ICD-10-CM | POA: Diagnosis not present

## 2020-12-03 DIAGNOSIS — I252 Old myocardial infarction: Secondary | ICD-10-CM | POA: Diagnosis not present

## 2020-12-03 DIAGNOSIS — Z7982 Long term (current) use of aspirin: Secondary | ICD-10-CM | POA: Diagnosis not present

## 2020-12-03 DIAGNOSIS — Z951 Presence of aortocoronary bypass graft: Secondary | ICD-10-CM | POA: Diagnosis not present

## 2020-12-03 DIAGNOSIS — I5032 Chronic diastolic (congestive) heart failure: Secondary | ICD-10-CM | POA: Diagnosis not present

## 2020-12-03 DIAGNOSIS — E1122 Type 2 diabetes mellitus with diabetic chronic kidney disease: Secondary | ICD-10-CM | POA: Diagnosis not present

## 2020-12-03 DIAGNOSIS — N184 Chronic kidney disease, stage 4 (severe): Secondary | ICD-10-CM | POA: Diagnosis not present

## 2020-12-03 DIAGNOSIS — Z9181 History of falling: Secondary | ICD-10-CM | POA: Diagnosis not present

## 2020-12-03 DIAGNOSIS — Z794 Long term (current) use of insulin: Secondary | ICD-10-CM | POA: Diagnosis not present

## 2020-12-03 DIAGNOSIS — I130 Hypertensive heart and chronic kidney disease with heart failure and stage 1 through stage 4 chronic kidney disease, or unspecified chronic kidney disease: Secondary | ICD-10-CM | POA: Diagnosis not present

## 2020-12-03 DIAGNOSIS — E785 Hyperlipidemia, unspecified: Secondary | ICD-10-CM | POA: Diagnosis not present

## 2020-12-03 DIAGNOSIS — E039 Hypothyroidism, unspecified: Secondary | ICD-10-CM | POA: Diagnosis not present

## 2020-12-03 DIAGNOSIS — I2510 Atherosclerotic heart disease of native coronary artery without angina pectoris: Secondary | ICD-10-CM | POA: Diagnosis not present

## 2020-12-03 DIAGNOSIS — M1991 Primary osteoarthritis, unspecified site: Secondary | ICD-10-CM | POA: Diagnosis not present

## 2020-12-03 DIAGNOSIS — R627 Adult failure to thrive: Secondary | ICD-10-CM | POA: Diagnosis not present

## 2020-12-03 DIAGNOSIS — K449 Diaphragmatic hernia without obstruction or gangrene: Secondary | ICD-10-CM | POA: Diagnosis not present

## 2020-12-05 DIAGNOSIS — I130 Hypertensive heart and chronic kidney disease with heart failure and stage 1 through stage 4 chronic kidney disease, or unspecified chronic kidney disease: Secondary | ICD-10-CM | POA: Diagnosis not present

## 2020-12-05 DIAGNOSIS — E039 Hypothyroidism, unspecified: Secondary | ICD-10-CM | POA: Diagnosis not present

## 2020-12-05 DIAGNOSIS — N184 Chronic kidney disease, stage 4 (severe): Secondary | ICD-10-CM | POA: Diagnosis not present

## 2020-12-12 ENCOUNTER — Encounter: Payer: Self-pay | Admitting: Nurse Practitioner

## 2020-12-12 ENCOUNTER — Ambulatory Visit: Payer: Medicare Other | Admitting: Nurse Practitioner

## 2020-12-12 VITALS — BP 110/70 | HR 64 | Ht 59.5 in | Wt 126.5 lb

## 2020-12-12 DIAGNOSIS — R110 Nausea: Secondary | ICD-10-CM

## 2020-12-12 DIAGNOSIS — R1013 Epigastric pain: Secondary | ICD-10-CM

## 2020-12-12 DIAGNOSIS — K5909 Other constipation: Secondary | ICD-10-CM | POA: Diagnosis not present

## 2020-12-12 NOTE — Patient Instructions (Addendum)
If you are age 81 or older, your body mass index should be between 23-30. Your Body mass index is 25.12 kg/m. If this is out of the aforementioned range listed, please consider follow up with your Primary Care Provider.  OVER THE COUNTER MEDICATION Please purchase the following medications over the counter and take as directed:  Miralax. Dissolve one capful in 8 ounces of water and drink before bed every other night to help soften the stools. Please add Famotidine 20 MG in the afternoons.  Increase your water intake.  Please call back to schedule a follow up with Dr. Henrene Pastor in 6 weeks when his schedule opens. It was great seeing you today! Thank you for entrusting me with your care and choosing [facility]  Noralyn Pick, CRNP

## 2020-12-12 NOTE — Progress Notes (Signed)
12/12/2020 [name] [id number] November 22, 1940   CHIEF COMPLAINT: Nausea and abdominal pain  HISTORY OF PRESENT ILLNESS:  [name] is an 80 year old female with a past medical history significant for anxiety, depression, hypertension, coronary artery disease, MI 2013 CHF, s/p 3 vessel CABG 2013, s/p stent 1245, chronic diastolic CHF, tachybradycardia syndrome s/p pacemaker placement,  paroxysmal atrial fibrillation s/p cardioversion in 2018 off Xarelto since 7/20202 secondary to acute anemia in setting of retroperitoneal bleed, DM II, chronic kidney disease stage III, anemia, hypothyroidism and GERD.   She was last seen in our office by Dr. Henrene Pastor 04/13/2016 for further evaluation regarding constipation and occasional diarrhea and she was prescribed Metamucil.  Her GERD symptoms were fairly stable at the time and she was advised to continue PPI therapy.  She presents to our office today accompanied by her daughter for further evaluation regarding nausea and upper abdominal pain.  The patient has mild dementia and her daughter is assisting with obtaining her history.  She complains of nausea which started early December 2021, however her daughter assessed her mother's nausea started around the end of November. She developed epigastric pain mid December 2021.  No specific food triggers.  Her nausea comes and goes but occurs most days in the afternoon.  She occasionally vomits up clear secretions with small bits of food at times.  No frank hematemesis or coffee-ground emesis.  She describes her epigastric pain as dull and lasts for a few minutes then goes away.  She is taking Pantoprazole 40 mg once daily.  She is passing a normal formed brown bowel movement every 2 to 3 days.  No obvious rectal bleeding.  No black stools.  She is lost 8 pounds over the past 6 weeks.  No fever, sweats or chills.  She lives in her own house by herself.  Her daughter lives nearby.  She presented to the ED on  10/03/2020 due to having nausea, poor appetite and fatigue.  Labs in the ED showed a sodium level 133.  Potassium 4.0.  Glucose 296.  BUN 93.  Creatinine 2.82 (baseline Cr 2 - 2.5).   BUN 93.  Alk phos 75.  Albumin 3.5.  Lipase 82.  AST 26.  ALT 17.  Total bili 0.7.  Troponin 110 -> 102 thought to be due to demand ischemia.  WBC 8.0.  Hemoglobin 10.5.  MCV 98.1.  Platelet 185.  A twelve-lead EKG was negative for acute ischemia.  No chest pain or shortness of breath.  An abdominal/pelvic CT without contrast without acute finding to the abdomen or pelvis, sigmoid diverticulosis without evidence of diverticulitis and minimal soft tissue stranding in the left retroperitoneum at the site of a previously identified hematoma since resolved was noted.  A right upper quadrant ultrasound showed a normal gallbladder and possible hepatic steatosis.  She was prescribed Zofran 56m ODT PRN, she has not yet taken. Her most recent EGD was 12/30/2010 which showed esophagitis in the distal esophagus otherwise was normal.   CBC Latest Ref Rng & Units 11/28/2020 10/05/2020 10/04/2020  WBC 4.0 - 10.5 K/uL - 7.4 6.9  Hemoglobin 12.0 - 16.0 10.3(A) 8.8(L) 9.0(L)  Hematocrit 36.0 - 46.0 % - 26.3(L) 27.2(L)  Platelets 150 - 400 K/uL - 148(L) 152    CMP Latest Ref Rng & Units 11/28/2020 10/05/2020 10/04/2020  Glucose 70 - 99 mg/dL - 122(H) 117(H)  BUN 4 - 21 61(A) 62(H) 78(H)  Creatinine 0.5 - 1.1 2.0(A) 2.33(H) 2.48(H)  Sodium 137 - 147 140 142 140  Potassium 3.4 - 5.3 4.5 4.2 3.9  Chloride 99 - 108 104 109 106  CO2 13 - 22 25(A) 23 23  Calcium 8.7 - 10.7 9.3 8.5(L) 8.4(L)  Total Protein 6.5 - 8.1 g/dL - 5.7(L) -  Total Bilirubin 0.3 - 1.2 mg/dL - 0.7 -  Alkaline Phos 38 - 126 U/L - 56 -  AST 15 - 41 U/L - 30 -  ALT 0 - 44 U/L - 19 -   RUQ sonogram: 10/03/2020: 1. No acute noncontrast CT findings of the abdomen or pelvis to explain nausea, vomiting, or weight loss. 2. Minimal soft tissue stranding in the left  retroperitoneum at the site of a previously identified hematoma, since resolved. 3. Sigmoid diverticulosis without evidence of acute diverticulitis. Aortic Atherosclerosis   CTAP without contrast 10/03/2020: 1. No acute noncontrast CT findings of the abdomen or pelvis to explain nausea, vomiting, or weight loss. 2. Minimal soft tissue stranding in the left retroperitoneum at the site of a previously identified hematoma, since resolved. 3. Sigmoid diverticulosis without evidence of acute diverticulitis. Aortic Atherosclerosis   Past GI procedures:  EGD 12/30/2010: 1.  Esophagitis in the distal soft 2.  Otherwise normal esophagus 3.  Normal 4.  Normal duodenum 5.  GERD  EGD by Dr. Henrene Pastor 08/31/2007: Esophageal stricture status post dilatation Reflux esophagitis  Colonoscopy 08/31/2007: Diverticulosis to the sigmoid colon No polyp Internal and external hemorrhoids  Past Medical History:  Diagnosis Date  . Anemia   . Anxiety   . Arthritis    "in my hands; knees, back" (09/27/2018)  . CAD (coronary artery disease)    a. 40-59% bilaterally 10/2015.  Marland Kitchen Chronic diastolic CHF (congestive heart failure) ([place])    a. 05/2016 Echo: EF 60-65%, no rwma, Gr1 DD, Ao sclerosis w/o stenosis, triv MR;  b. 07/2016 TEE: EF 55-60%, no rwma, mild MR.  . CKD (chronic kidney disease), stage III ([place])   . Coronary artery disease    a. 02/2007 Persantine MV: low risk;  b. 11/2011 CABG x 3 (LIMA->LAD, VG->OM, VG->RCA);  c. 05/2016 MV: EF >65%, no isch/infarct, horiz ST dep in I, II, V5-V6.  Marland Kitchen Depression   . Diverticulosis   . Esophageal stricture   . GERD (gastroesophageal reflux disease)   . Hemorrhoids   . Hiatal hernia   . Hyperkalemia    a. ARB stopped due to this.  . Hyperlipidemia   . Hypertension   . Hypertensive heart disease   . Hypothyroidism   . Major depressive disorder with anxious distress 09/05/2019  . Mild cognitive impairment    a. seen by neurology.  . Mild vascular  neurocognitive disorder ([place]) 09/05/2019  . Myocardial infarction ([place]) 12/07/2011  . PAF (paroxysmal atrial fibrillation) ([facility])    a. post-op CABG.  . Pain    RIGHT KNEE PAIN - TORN RIGHT MEDIAL MENISCUS  . Paroxysmal atrial flutter ([place])    a. 07/2016 s/p TEE & DCCV;  b. 07/2016 Recurrent PAFlutter req initiation of amio & PPM in setting of tachy-brady;  c. CHA2DS2VASc = 7-->Xarelto 15 mg QD.  Marland Kitchen Pneumonia    "twice" (09/27/2018)  . PONV (postoperative nausea and vomiting)   . Presence of permanent cardiac pacemaker 08/12/2016  . S/P CABG (coronary artery bypass graft), 12/04/11 12/07/2011   LIMA to LAD, SVG to OM, SVG to RCA  . Sinus bradycardia    a. not on BB due to this.  . Skin cancer    "  face" (09/27/2018)  . Tachy-brady syndrome ([place])    a. 07/2016 Jxnl brady following DCCV, recurrent Aflutter-->amio + SJM 2272 Assurity MRI DC PPM (ser # [id number]).  . Type II diabetes mellitus ([place])    Past Surgical History:  Procedure Laterality Date  . ABDOMINAL HYSTERECTOMY  1980's  . ANKLE FRACTURE SURGERY Right    "put pins both side right ankle"  . BACK SURGERY  2006   "cyst growing near my spine"  . CARDIAC CATHETERIZATION  12/02/2011   mild LV dysfunction with mod hypocontractility of mid-distal anterolateral wall; CAD w/ostial tapering of L [place] with 50% diffuse ostial narrowing of LAD, 99% eccentric focal prox LAD stenosis followed by 70% prox LAD stenosis after 1st diag, 20% mid LAD narrowing; 80% ostial-to-prox L Cfx stenosis & 40-50% irregularity of RCA (Dr. Corky Downs)  . CARDIOVERSION N/A 08/11/2016   Procedure: CARDIOVERSION;  Surgeon: Lelon Perla, MD;  [facility] END[facility]COPY;  Service: Cardiovascular;  Laterality: N/A;  . CATARACT EXTRACTION W/ INTRAOCULAR LENS  IMPLANT, BILATERAL Bilateral ~ 2010  . [place]  . CORONARY ARTERY BYPASS GRAFT  12/04/2011   Procedure: CORONARY ARTERY BYPASS GRAFTING (CABG);  Surgeon: Tharon Aquas Adelene Idler, MD;  Location: [facility];   Service: Open Heart Surgery;  Laterality: N/A;  CABG x three,  using left internal mammary artery, and right leg greater saphenous vein harvested endoscopically  . CORONARY STENT INTERVENTION N/A 09/27/2018   Procedure: CORONARY STENT INTERVENTION;  Surgeon: Troy Sine, MD;  Location: [facility] CV LAB;  Service: Cardiovascular;  Laterality: N/A;  . DILATION AND CURETTAGE OF UTERUS     "a couple times"  . EP IMPLANTABLE DEVICE N/A 08/12/2016   Procedure: Pacemaker Implant;  Surgeon: Will Meredith Leeds, MD;  Location: [place] CV LAB;  Service: Cardiovascular;  Laterality: N/A;  . ESOPHAGOGASTRODUODENOSCOPY (EGD) WITH ESOPHAGEAL DILATION    . FRACTURE SURGERY    . JOINT REPLACEMENT    . KNEE ARTHROSCOPY WITH MEDIAL MENISECTOMY Right 07/02/2014   Procedure: RIGHT KNEE ARTHROSCOPY WITH PARTIAL MEDIAL MENISTECTOMY, ABRASION CONDROPLASTYU OF PATELLA,ABRASION CONDROPLASTY OF MEDIAL FEMEROL CONDYL, MICROFRACTURE OF MEDIAL FEMEROL CONDYL;  Surgeon: Tobi Bastos, MD;  Location: [facility] ORS;  Service: Orthopedics;  Laterality: Right;  . LEFT HEART CATH AND CORS/GRAFTS ANGIOGRAPHY N/A 09/06/2018   Procedure: LEFT HEART CATH AND CORS/GRAFTS ANGIOGRAPHY;  Surgeon: Troy Sine, MD;  Location: [place] CV LAB;  Service: Cardiovascular;  Laterality: N/A;  . LEFT HEART CATHETERIZATION WITH CORONARY ANGIOGRAM N/A 12/02/2011   Procedure: LEFT HEART CATHETERIZATION WITH CORONARY ANGIOGRAM;  Surgeon: Troy Sine, MD;  Location: [facility] CATH LAB;  Service: Cardiovascular;  Laterality: N/A;  Coronary angiogram, possible PCI  . TEE WITHOUT CARDIOVERSION N/A 08/11/2016   Procedure: TRANSESOPHAGEAL ECHOCARDIOGRAM (TEE);  Surgeon: Lelon Perla, MD;  Location: [facility] ENDOSCOPY;  Service: Cardiovascular;  Laterality: N/A;  . TEE WITHOUT CARDIOVERSION N/A 05/18/2019   Procedure: TRANSESOPHAGEAL ECHOCARDIOGRAM (TEE);  Surgeon: [name], MD;  Location: [place];  Service: Cardiovascular;  Laterality: N/A;  .  TONSILLECTOMY  1949  . TOTAL KNEE ARTHROPLASTY Left ~ 2006  . TRANSTHORACIC ECHOCARDIOGRAM  02/19/2013   EF 35-46%, grade 1 diastolic dysfunction; mildly thickend/calcified AV leaflets; mildly calcidied MV annulus; mild TR   Social History: Non-smoker.  No alcohol use.  No drug use.  Family History: 3 sisters with breast cancer. One sister with lung cancer smoker. Mother deceased with history of CVA. Father deceased with history of heart disease.  Allergies  Allergen Reactions  . Clonidine Derivatives Other (See Comments)    Bradycardia and fatigue   . Sulfa Antibiotics Other (See Comments)    Unknown  . Crestor [Rosuvastatin Calcium] Other (See Comments)    Other reaction(s): tired and weak  . Epinephrine Other (See Comments)    Abnormal feeling. Dental exam/injection of local w/ epi.  Marland Kitchen Hydralazine Other (See Comments)    Nausea/gi upset   . Losartan Other (See Comments)    Hyperkalemia   . Other Other (See Comments)    MANGO'S - WELTS ALL OVER      Outpatient Encounter Medications as of 12/12/2020  Medication Sig  . acetaminophen (TYLENOL) 650 MG CR tablet Take 650 mg by mouth every 8 (eight) hours as needed for pain.  Marland Kitchen allopurinol (ZYLOPRIM) 100 MG tablet Take 100 mg by mouth daily.  . carvedilol (COREG) 25 MG tablet Take 1 tablet by mouth twice daily  . diclofenac Sodium (VOLTAREN) 1 % GEL Apply 2 g topically 2 (two) times daily as needed (pain).  Marland Kitchen docusate sodium (COLACE) 100 MG capsule Take 100 mg by mouth 2 (two) times daily as needed for mild constipation or moderate constipation.  . ferrous sulfate 325 (65 FE) MG EC tablet Take 325 mg by mouth daily with breakfast.  . gabapentin (NEURONTIN) 300 MG capsule Take 1 capsule (300 mg total) by mouth at bedtime.  Marland Kitchen LANTUS SOLOSTAR 100 UNIT/ML Solostar Pen Inject 22 Units into the skin daily.  Marland Kitchen levothyroxine (SYNTHROID) 88 MCG tablet Take 88 mcg by mouth daily before breakfast.  . meclizine (ANTIVERT) 25 MG tablet Take  25 mg by mouth daily as needed for dizziness. For dizziness  . Multiple Vitamins-Minerals (PRESERVISION AREDS 2 PO) Take 1 tablet by mouth every evening.   . nitroGLYCERIN (NITROSTAT) 0.4 MG SL tablet Place 1 tablet (0.4 mg total) under the tongue every 5 (five) minutes as needed for chest pain (max 3 doses).  . pantoprazole (PROTONIX) 40 MG tablet Take 40 mg by mouth daily.  Vladimir Faster Glycol-Propyl Glycol (SYSTANE OP) Place 1 drop into both eyes 2 (two) times daily as needed (dry eyes).  . rosuvastatin (CRESTOR) 10 MG tablet Take 10 mg by mouth daily.  Marland Kitchen torsemide (DEMADEX) 20 MG tablet Take 2 tablets (40 mg total) by mouth daily.   No facility-administered encounter medications on file as of 12/12/2020.    REVIEW OF SYSTEMS:  Gen: Denies fever, sweats or chills. No weight loss.  CV: Denies chest pain, palpitations or edema. Resp: Denies cough, shortness of breath of hemoptysis.  GI: See HPI. GU : Denies urinary burning, blood in urine, increased urinary frequency or incontinence. MS: Denies joint pain, muscles aches or weakness. Derm: Denies rash, itchiness, skin lesions or unhealing ulcers. Psych: + depression and anxiety. + memory issues.  Heme: Denies bruising, bleeding. Neuro:  Denies headaches, dizziness or paresthesias. Endo:  + DM.    PHYSICAL EXAM: Ht 4' 11.5" (1.511 m) Comment: height measured without shoes  Wt 126 lb 8 oz (57.4 kg)   BMI 25.12 kg/m  General: Frail-appearing 80 year old female in no acute distress. Head: Normocephalic and atraumatic. Eyes:  Sclerae non-icteric, conjunctive pink. Ears: Normal auditory acuity. Mouth: No ulcers or lesions.  Neck: Supple, no lymphadenopathy or thyromegaly.  Lungs: Clear bilaterally to auscultation without wheezes, crackles or rhonchi. Heart: Regular rate and rhythm. No murmur, rub or gallop appreciated.  Abdomen: Soft, nontender, non distended. No masses. No hepatosplenomegaly. Normoactive bowel sounds x 4 quadrants.  Rectal: Deferred. Musculoskeletal: Symmetrical with no gross deformities. Skin: Warm and dry. No rash or lesions on visible extremities. Extremities: No edema. Neurological: Alert oriented x 4, no focal deficits.  Psychological:  Alert and cooperative. Normal mood and affect.  ASSESSMENT AND PLAN:  35.  80 year old female with a history of GERD presents with nausea, infrequent vomiting and epigastric pain. CTAP and RUQ sono done in the ED 10/03/2020 were unrevealing.  -Continue Pantoprazole 40 mg in the morning.  Add Famotidine 20 mg in the afternoon. -If no improvement or if symptoms worsen I discussed scheduling an EGD with cardiac clearance. Further recommendations per Dr. Henrene Pastor.  -Recommended 3 to 4 snack size meals daily. No plans for a gastric empty study at this time. -Ondansetron 46m ODT PRN as prescribed by PCP -Daughter to call our office if her symptoms worsen -Follow up in office in 6 weeks   2. Coronary artery disease, MI 2013 CHF, s/p 3 vessel CABG 2013,  s/p stent 27829 chronic diastolic CHF  3. Atrial fibrillation s/p cardioversion in 2018 off Xarelto since 7/20202 secondary to acute anemia in setting of retroperitoneal bleed. CHA2DS2-VASc score: 6.  4. DM II on insulin   5. Suspect dementia, cognitive decline   6.  CKD stage III followed by Dr. GMoshe Cipro    CC:  VLeeroy Cha*

## 2020-12-13 NOTE — Progress Notes (Signed)
Agree with initial assessment and plans 

## 2020-12-15 ENCOUNTER — Other Ambulatory Visit: Payer: Self-pay | Admitting: Internal Medicine

## 2020-12-18 DIAGNOSIS — I10 Essential (primary) hypertension: Secondary | ICD-10-CM | POA: Diagnosis not present

## 2020-12-18 DIAGNOSIS — E039 Hypothyroidism, unspecified: Secondary | ICD-10-CM | POA: Diagnosis not present

## 2020-12-18 DIAGNOSIS — E1165 Type 2 diabetes mellitus with hyperglycemia: Secondary | ICD-10-CM | POA: Diagnosis not present

## 2020-12-18 DIAGNOSIS — Z794 Long term (current) use of insulin: Secondary | ICD-10-CM | POA: Diagnosis not present

## 2020-12-18 DIAGNOSIS — E1142 Type 2 diabetes mellitus with diabetic polyneuropathy: Secondary | ICD-10-CM | POA: Diagnosis not present

## 2020-12-18 DIAGNOSIS — M8580 Other specified disorders of bone density and structure, unspecified site: Secondary | ICD-10-CM | POA: Diagnosis not present

## 2020-12-18 DIAGNOSIS — E785 Hyperlipidemia, unspecified: Secondary | ICD-10-CM | POA: Diagnosis not present

## 2021-01-17 ENCOUNTER — Other Ambulatory Visit: Payer: Self-pay | Admitting: Internal Medicine

## 2021-02-04 ENCOUNTER — Emergency Department (HOSPITAL_COMMUNITY): Payer: Medicare Other

## 2021-02-04 ENCOUNTER — Emergency Department (HOSPITAL_COMMUNITY)
Admission: EM | Admit: 2021-02-04 | Discharge: 2021-02-04 | Disposition: A | Discharge status: Home / Self Care | Payer: Medicare Other | Attending: Emergency Medicine | Admitting: Emergency Medicine

## 2021-02-04 ENCOUNTER — Other Ambulatory Visit: Payer: Self-pay

## 2021-02-04 DIAGNOSIS — Z23 Encounter for immunization: Secondary | ICD-10-CM | POA: Insufficient documentation

## 2021-02-04 DIAGNOSIS — U071 COVID-19: Secondary | ICD-10-CM | POA: Diagnosis not present

## 2021-02-04 DIAGNOSIS — Y92410 Unspecified street and highway as the place of occurrence of the external cause: Secondary | ICD-10-CM | POA: Insufficient documentation

## 2021-02-04 DIAGNOSIS — S62514A Nondisplaced fracture of proximal phalanx of right thumb, initial encounter for closed fracture: Secondary | ICD-10-CM | POA: Diagnosis not present

## 2021-02-04 DIAGNOSIS — R6889 Other general symptoms and signs: Secondary | ICD-10-CM | POA: Diagnosis not present

## 2021-02-04 DIAGNOSIS — S61411A Laceration without foreign body of right hand, initial encounter: Secondary | ICD-10-CM | POA: Insufficient documentation

## 2021-02-04 DIAGNOSIS — S0990XA Unspecified injury of head, initial encounter: Secondary | ICD-10-CM | POA: Insufficient documentation

## 2021-02-04 DIAGNOSIS — E1165 Type 2 diabetes mellitus with hyperglycemia: Secondary | ICD-10-CM | POA: Diagnosis not present

## 2021-02-04 DIAGNOSIS — Z743 Need for continuous supervision: Secondary | ICD-10-CM | POA: Diagnosis not present

## 2021-02-04 DIAGNOSIS — R404 Transient alteration of awareness: Secondary | ICD-10-CM | POA: Diagnosis not present

## 2021-02-04 DIAGNOSIS — Z041 Encounter for examination and observation following transport accident: Secondary | ICD-10-CM | POA: Diagnosis not present

## 2021-02-04 DIAGNOSIS — R410 Disorientation, unspecified: Secondary | ICD-10-CM | POA: Diagnosis not present

## 2021-02-04 DIAGNOSIS — S6991XA Unspecified injury of right wrist, hand and finger(s), initial encounter: Secondary | ICD-10-CM | POA: Diagnosis present

## 2021-02-04 LAB — PROTIME-INR
INR: 1.1 (ref 0.8–1.2)
Prothrombin Time: 13.9 seconds (ref 11.4–15.2)

## 2021-02-04 LAB — COMPREHENSIVE METABOLIC PANEL
ALT: 15 U/L (ref 0–44)
AST: 20 U/L (ref 15–41)
Albumin: 3.6 g/dL (ref 3.5–5.0)
Alkaline Phosphatase: 71 U/L (ref 38–126)
Anion gap: 12 (ref 5–15)
BUN: 45 mg/dL — ABNORMAL HIGH (ref 8–23)
CO2: 24 mmol/L (ref 22–32)
Calcium: 8.7 mg/dL — ABNORMAL LOW (ref 8.9–10.3)
Chloride: 103 mmol/L (ref 98–111)
Creatinine, Ser: 2.02 mg/dL — ABNORMAL HIGH (ref 0.44–1.00)
GFR, Estimated: 24 mL/min — ABNORMAL LOW (ref >60)
Glucose, Bld: 326 mg/dL — ABNORMAL HIGH (ref 70–99)
Potassium: 4.3 mmol/L (ref 3.5–5.1)
Sodium: 139 mmol/L (ref 135–145)
Total Bilirubin: 0.7 mg/dL (ref 0.3–1.2)
Total Protein: 6.7 g/dL (ref 6.5–8.1)

## 2021-02-04 LAB — I-STAT CHEM 8, ED
BUN: 47 mg/dL — ABNORMAL HIGH (ref 8–23)
Calcium, Ion: 1.06 mmol/L — ABNORMAL LOW (ref 1.15–1.40)
Chloride: 103 mmol/L (ref 98–111)
Creatinine, Ser: 1.9 mg/dL — ABNORMAL HIGH (ref 0.44–1.00)
Glucose, Bld: 310 mg/dL — ABNORMAL HIGH (ref 70–99)
HCT: 32 % — ABNORMAL LOW (ref 36.0–46.0)
Hemoglobin: 10.9 g/dL — ABNORMAL LOW (ref 12.0–15.0)
Potassium: 4.3 mmol/L (ref 3.5–5.1)
Sodium: 139 mmol/L (ref 135–145)
TCO2: 25 mmol/L (ref 22–32)

## 2021-02-04 LAB — CBC
HCT: 32.3 % — ABNORMAL LOW (ref 36.0–46.0)
Hemoglobin: 10.9 g/dL — ABNORMAL LOW (ref 12.0–15.0)
MCH: 34.6 pg — ABNORMAL HIGH (ref 26.0–34.0)
MCHC: 33.7 g/dL (ref 30.0–36.0)
MCV: 102.5 fL — ABNORMAL HIGH (ref 80.0–100.0)
Platelets: 143 10*3/uL — ABNORMAL LOW (ref 150–400)
RBC: 3.15 MIL/uL — ABNORMAL LOW (ref 3.87–5.11)
RDW: 13.5 % (ref 11.5–15.5)
WBC: 6.7 10*3/uL (ref 4.0–10.5)
nRBC: 0 % (ref 0.0–0.2)

## 2021-02-04 LAB — LACTIC ACID, PLASMA: Lactic Acid, Venous: 1.7 mmol/L (ref 0.5–1.9)

## 2021-02-04 LAB — RESP PANEL BY RT-PCR (FLU A&B, COVID) ARPGX2
Influenza A by PCR: NEGATIVE
Influenza B by PCR: NEGATIVE
SARS Coronavirus 2 by RT PCR: POSITIVE — AB

## 2021-02-04 MED ORDER — TETANUS-DIPHTH-ACELL PERTUSSIS 5-2.5-18.5 LF-MCG/0.5 IM SUSY
0.5000 mL | PREFILLED_SYRINGE | Freq: Once | INTRAMUSCULAR | Status: AC
  Administered 2021-02-04: 0.5 mL via INTRAMUSCULAR
  Filled 2021-02-04: qty 0.5

## 2021-02-04 MED ORDER — CARVEDILOL 3.125 MG PO TABS: 25.0000 mg | ORAL_TABLET | Freq: Two times a day (BID) | ORAL | Status: DC

## 2021-02-04 MED ORDER — ACETAMINOPHEN 325 MG PO TABS
650.0000 mg | ORAL_TABLET | Freq: Once | ORAL | Status: AC
  Administered 2021-02-04: 650 mg via ORAL
  Filled 2021-02-04: qty 2
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
## 2021-02-04 NOTE — Discharge Instructions (Signed)
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
## 2021-02-04 NOTE — Progress Notes (Signed)
Responded to page to support patient,family and staff.  Upon arrive patient sitting up and other staff supporting.  Chaplain available as needed.  Jaclynn Major, [employer], [facility], Pager [phone]
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
## 2021-02-04 NOTE — ED Notes (Signed)
Patient transported to CT 

## 2021-02-17 ENCOUNTER — Ambulatory Visit (INDEPENDENT_AMBULATORY_CARE_PROVIDER_SITE_OTHER): Payer: Medicare Other

## 2021-02-17 DIAGNOSIS — I495 Sick sinus syndrome: Secondary | ICD-10-CM

## 2021-02-18 LAB — CUP PACEART REMOTE DEVICE CHECK
Battery Remaining Longevity: 119 mo
Battery Remaining Percentage: 95.5 %
Battery Voltage: 2.99 V
Brady Statistic AP VP Percent: 1 %
Brady Statistic AP VS Percent: 99 %
Brady Statistic AS VP Percent: 1 %
Brady Statistic AS VS Percent: 1 %
Brady Statistic RA Percent Paced: 99 %
Brady Statistic RV Percent Paced: 1 %
Date Time Interrogation Session: 20220427035403
Implantable Lead Implant Date: 20171019
Implantable Lead Implant Date: 20171019
Implantable Lead Location: 753859
Implantable Lead Location: 753860
Implantable Pulse Generator Implant Date: 20171019
Lead Channel Impedance Value: 390 Ohm
Lead Channel Impedance Value: 660 Ohm
Lead Channel Pacing Threshold Amplitude: 0.5 V
Lead Channel Pacing Threshold Amplitude: 0.75 V
Lead Channel Pacing Threshold Pulse Width: 0.4 ms
Lead Channel Pacing Threshold Pulse Width: 0.4 ms
Lead Channel Sensing Intrinsic Amplitude: 12 mV
Lead Channel Sensing Intrinsic Amplitude: 2.6 mV
Lead Channel Setting Pacing Amplitude: 2 V
Lead Channel Setting Pacing Amplitude: 2.5 V
Lead Channel Setting Pacing Pulse Width: 0.4 ms
Lead Channel Setting Sensing Sensitivity: 2 mV
Pulse Gen Model: 2272
Pulse Gen Serial Number: 3180458

## 2021-03-09 NOTE — Progress Notes (Signed)
Remote pacemaker transmission.   

## 2021-03-17 DIAGNOSIS — E875 Hyperkalemia: Secondary | ICD-10-CM | POA: Diagnosis not present

## 2021-03-17 DIAGNOSIS — N183 Chronic kidney disease, stage 3 unspecified: Secondary | ICD-10-CM | POA: Diagnosis not present

## 2021-03-17 DIAGNOSIS — D631 Anemia in chronic kidney disease: Secondary | ICD-10-CM | POA: Diagnosis not present

## 2021-03-17 DIAGNOSIS — I129 Hypertensive chronic kidney disease with stage 1 through stage 4 chronic kidney disease, or unspecified chronic kidney disease: Secondary | ICD-10-CM | POA: Diagnosis not present

## 2021-03-17 DIAGNOSIS — N179 Acute kidney failure, unspecified: Secondary | ICD-10-CM | POA: Diagnosis not present

## 2021-03-19 DIAGNOSIS — Z794 Long term (current) use of insulin: Secondary | ICD-10-CM | POA: Diagnosis not present

## 2021-03-19 DIAGNOSIS — I130 Hypertensive heart and chronic kidney disease with heart failure and stage 1 through stage 4 chronic kidney disease, or unspecified chronic kidney disease: Secondary | ICD-10-CM | POA: Diagnosis not present

## 2021-03-19 DIAGNOSIS — Z9181 History of falling: Secondary | ICD-10-CM | POA: Diagnosis not present

## 2021-03-19 DIAGNOSIS — E039 Hypothyroidism, unspecified: Secondary | ICD-10-CM | POA: Diagnosis not present

## 2021-03-19 DIAGNOSIS — M810 Age-related osteoporosis without current pathological fracture: Secondary | ICD-10-CM | POA: Diagnosis not present

## 2021-03-19 DIAGNOSIS — R2689 Other abnormalities of gait and mobility: Secondary | ICD-10-CM | POA: Diagnosis not present

## 2021-03-19 DIAGNOSIS — Z0000 Encounter for general adult medical examination without abnormal findings: Secondary | ICD-10-CM | POA: Diagnosis not present

## 2021-03-19 DIAGNOSIS — E1165 Type 2 diabetes mellitus with hyperglycemia: Secondary | ICD-10-CM | POA: Diagnosis not present

## 2021-03-19 DIAGNOSIS — E1142 Type 2 diabetes mellitus with diabetic polyneuropathy: Secondary | ICD-10-CM | POA: Diagnosis not present

## 2021-03-19 DIAGNOSIS — E785 Hyperlipidemia, unspecified: Secondary | ICD-10-CM | POA: Diagnosis not present

## 2021-03-19 DIAGNOSIS — I5032 Chronic diastolic (congestive) heart failure: Secondary | ICD-10-CM | POA: Diagnosis not present

## 2021-03-19 DIAGNOSIS — N184 Chronic kidney disease, stage 4 (severe): Secondary | ICD-10-CM | POA: Diagnosis not present

## 2021-03-19 DIAGNOSIS — I10 Essential (primary) hypertension: Secondary | ICD-10-CM | POA: Diagnosis not present

## 2021-03-19 DIAGNOSIS — K219 Gastro-esophageal reflux disease without esophagitis: Secondary | ICD-10-CM | POA: Diagnosis not present

## 2021-03-19 DIAGNOSIS — M8580 Other specified disorders of bone density and structure, unspecified site: Secondary | ICD-10-CM | POA: Diagnosis not present

## 2021-03-19 DIAGNOSIS — E1122 Type 2 diabetes mellitus with diabetic chronic kidney disease: Secondary | ICD-10-CM | POA: Diagnosis not present

## 2021-03-22 ENCOUNTER — Other Ambulatory Visit: Payer: Self-pay

## 2021-03-22 ENCOUNTER — Encounter (HOSPITAL_COMMUNITY): Payer: Self-pay

## 2021-03-22 ENCOUNTER — Emergency Department (HOSPITAL_COMMUNITY)
Admission: EM | Admit: 2021-03-22 | Discharge: 2021-03-22 | Disposition: A | Discharge status: Home / Self Care | Payer: Medicare Other | Attending: Emergency Medicine | Admitting: Emergency Medicine

## 2021-03-22 DIAGNOSIS — I130 Hypertensive heart and chronic kidney disease with heart failure and stage 1 through stage 4 chronic kidney disease, or unspecified chronic kidney disease: Secondary | ICD-10-CM | POA: Insufficient documentation

## 2021-03-22 DIAGNOSIS — Z79899 Other long term (current) drug therapy: Secondary | ICD-10-CM | POA: Insufficient documentation

## 2021-03-22 DIAGNOSIS — R6889 Other general symptoms and signs: Secondary | ICD-10-CM | POA: Diagnosis not present

## 2021-03-22 DIAGNOSIS — Z743 Need for continuous supervision: Secondary | ICD-10-CM | POA: Diagnosis not present

## 2021-03-22 DIAGNOSIS — E1122 Type 2 diabetes mellitus with diabetic chronic kidney disease: Secondary | ICD-10-CM | POA: Diagnosis not present

## 2021-03-22 DIAGNOSIS — Z7982 Long term (current) use of aspirin: Secondary | ICD-10-CM | POA: Diagnosis not present

## 2021-03-22 DIAGNOSIS — I5032 Chronic diastolic (congestive) heart failure: Secondary | ICD-10-CM | POA: Insufficient documentation

## 2021-03-22 DIAGNOSIS — E039 Hypothyroidism, unspecified: Secondary | ICD-10-CM | POA: Diagnosis not present

## 2021-03-22 DIAGNOSIS — R040 Epistaxis: Secondary | ICD-10-CM | POA: Diagnosis not present

## 2021-03-22 DIAGNOSIS — I2510 Atherosclerotic heart disease of native coronary artery without angina pectoris: Secondary | ICD-10-CM | POA: Insufficient documentation

## 2021-03-22 DIAGNOSIS — R404 Transient alteration of awareness: Secondary | ICD-10-CM | POA: Diagnosis not present

## 2021-03-22 DIAGNOSIS — N184 Chronic kidney disease, stage 4 (severe): Secondary | ICD-10-CM | POA: Diagnosis not present

## 2021-03-22 DIAGNOSIS — Z96652 Presence of left artificial knee joint: Secondary | ICD-10-CM | POA: Diagnosis not present

## 2021-03-22 DIAGNOSIS — Z951 Presence of aortocoronary bypass graft: Secondary | ICD-10-CM | POA: Diagnosis not present

## 2021-03-22 LAB — CBG MONITORING, ED: Glucose-Capillary: 205 mg/dL — ABNORMAL HIGH (ref 70–99)

## 2021-03-22 LAB — I-STAT CHEM 8, ED
BUN: 68 mg/dL — ABNORMAL HIGH (ref 8–23)
Calcium, Ion: 1.09 mmol/L — ABNORMAL LOW (ref 1.15–1.40)
Chloride: 105 mmol/L (ref 98–111)
Creatinine, Ser: 2.3 mg/dL — ABNORMAL HIGH (ref 0.44–1.00)
Glucose, Bld: 189 mg/dL — ABNORMAL HIGH (ref 70–99)
HCT: 31 % — ABNORMAL LOW (ref 36.0–46.0)
Hemoglobin: 10.5 g/dL — ABNORMAL LOW (ref 12.0–15.0)
Potassium: 4.5 mmol/L (ref 3.5–5.1)
Sodium: 139 mmol/L (ref 135–145)
TCO2: 24 mmol/L (ref 22–32)

## 2021-03-22 MED ORDER — OXYMETAZOLINE HCL 0.05 % NA SOLN
1.0000 | Freq: Once | NASAL | Status: AC
  Administered 2021-03-22: 1 via NASAL
  Filled 2021-03-22: qty 30

## 2021-03-22 MED ORDER — CARVEDILOL 12.5 MG PO TABS
25.0000 mg | ORAL_TABLET | Freq: Once | ORAL | Status: AC
  Administered 2021-03-22: 25 mg via ORAL
  Filled 2021-03-22: qty 2

## 2021-03-22 MED ORDER — LIDOCAINE-EPINEPHRINE (PF) 2 %-1:200000 IJ SOLN
20.0000 mL | Freq: Once | INTRAMUSCULAR | Status: AC
  Administered 2021-03-22: 20 mL via INTRADERMAL
  Filled 2021-03-22: qty 20

## 2021-03-22 NOTE — ED Triage Notes (Signed)
Pt bib [facility] from home d/t copious epistasis that started at 8:30 am this morning. Bleeding controled with EMS.  Hx diabetes. Pt has a pacemaker.   BP 212/ 76  cbg 211

## 2021-03-22 NOTE — ED Provider Notes (Signed)
[facility] EMERGENCY DEPARTMENT Provider Note   CSN: [id number] Arrival date & time: 03/22/21  1057     History Chief Complaint  Patient presents with  . Epistaxis    [name] is a 80 y.o. female who presents emergency department chief complaint of epistaxis.  Patient states that this morning her nose began bleeding and despite efforts including a cold rag on her face and pinching her nostrils she could not get it to stop.  Bleeding controlled upon arrival but did restart.  She was notably hypertensive and states that she did not take her blood pressure medicine this morning.  She denies any injuries to the nose, history of previous episodes of epistaxis, nasal allergies.  She states that predominantly the blood was dripping from her right naris and had some trickling down the back of her throat.  She denies weakness, lightheadedness or shortness of breath.  HPI     Past Medical History:  Diagnosis Date  . Anemia   . Anxiety   . Arthritis    "in my hands; knees, back" (09/27/2018)  . CAD (coronary artery disease)    a. 40-59% bilaterally 10/2015.  Marland Kitchen Chronic diastolic CHF (congestive heart failure) ([place])    a. 05/2016 Echo: EF 60-65%, no rwma, Gr1 DD, Ao sclerosis w/o stenosis, triv MR;  b. 07/2016 TEE: EF 55-60%, no rwma, mild MR.  . Chronic headaches   . CKD (chronic kidney disease), stage III ([place])   . Coronary artery disease    a. 02/2007 Persantine MV: low risk;  b. 11/2011 CABG x 3 (LIMA->LAD, VG->OM, VG->RCA);  c. 05/2016 MV: EF >65%, no isch/infarct, horiz ST dep in I, II, V5-V6.  Marland Kitchen Depression   . Diverticulosis   . Esophageal stricture   . GERD (gastroesophageal reflux disease)   . Hemorrhoids   . Hiatal hernia   . Hyperkalemia    a. ARB stopped due to this.  . Hyperlipidemia   . Hypertension   . Hypertensive heart disease   . Hypothyroidism   . Major depressive disorder with anxious distress 09/05/2019  . Mild cognitive impairment    a.  seen by neurology.  . Mild vascular neurocognitive disorder ([place]) 09/05/2019  . Myocardial infarction ([place]) 12/07/2011  . PAF (paroxysmal atrial fibrillation) (HCC)    a. post-op CABG.  . Pain    RIGHT KNEE PAIN - TORN RIGHT MEDIAL MENISCUS  . Paroxysmal atrial flutter ([place])    a. 07/2016 s/p TEE & DCCV;  b. 07/2016 Recurrent PAFlutter req initiation of amio & PPM in setting of tachy-brady;  c. CHA2DS2VASc = 7-->Xarelto 15 mg QD.  Marland Kitchen Pneumonia    "twice" (09/27/2018)  . PONV (postoperative nausea and vomiting)   . Presence of permanent cardiac pacemaker 08/12/2016  . S/P CABG (coronary artery bypass graft), 12/04/11 12/07/2011   LIMA to LAD, SVG to OM, SVG to RCA  . Sinus bradycardia    a. not on BB due to this.  . Skin cancer    "face" (09/27/2018)  . Tachy-brady syndrome ([place])    a. 07/2016 Jxnl brady following DCCV, recurrent Aflutter-->amio + SJM 2272 Assurity MRI DC PPM (ser # [id number]).  . Type II diabetes mellitus [facility])     Patient Active Problem List   Diagnosis Date Noted  . Acute kidney injury superimposed on chronic kidney disease ([place]) 10/03/2020  . Nausea & vomiting 10/03/2020  . Abdominal pain 10/03/2020  . Mild vascular neurocognitive disorder 09/05/2019  . Major depressive  disorder with anxious distress 09/05/2019  . Coag negative Staphylococcus bacteremia 05/21/2019  . Acute blood loss anemia 05/21/2019  . Anemia of chronic disease 05/21/2019  . Anemia in chronic kidney disease 05/21/2019  . SSS (sick sinus syndrome) ([place])   . Paroxysmal atrial fibrillation (HCC)   . Acute metabolic encephalopathy   . Retroperitoneal hematoma   . Muscle hemorrhage   . S/P placement of cardiac pacemaker   . SIRS (systemic inflammatory response syndrome) ([place]) 05/15/2019  . Acute encephalopathy 05/15/2019  . Status post coronary artery stent placement   . CAD S/P percutaneous coronary angioplasty 09/27/2018  . Abnormal nuclear stress test   . Pain of left shoulder joint on  movement 06/16/2018  . Closed fracture of upper end of humerus 06/16/2018  . Pain in right knee 04/12/2018  . Spinal stenosis of lumbar region 02/28/2018  . On anticoagulant therapy 04/22/2017  . Tachycardia-bradycardia syndrome ([place]) 12/23/2016  . Accelerated hypertension   . Chronic diastolic CHF (congestive heart failure) ([place]) 08/09/2016  . Elevated troponin 08/09/2016  . Hypertensive urgency 08/08/2016  . Paroxysmal atrial flutter ([place]) 08/08/2016  . Coronary artery disease   . Hypertensive heart disease   . CKD (chronic kidney disease) stage 4, GFR 15-29 ml/min (HCC)   . Other specified hypothyroidism   . Acute anemia   . Pain in the chest   . Shortness of breath 05/23/2016  . Pre-syncope 05/23/2016  . Chest tightness 09/16/2014  . Osteoarthritis of right knee 07/02/2014  . Acute medial meniscus tear of right knee 07/02/2014  . Diabetic peripheral neuropathy associated with type 1 diabetes mellitus ([place]) 09/05/2013  . Edema of extremities 04/18/2013  . Cellulitis 04/18/2013  . Essential hypertension 01/06/2012  . Acute renal failure,admitted 01/03/12, after admission for diastolic chf 62/13/0865  . S/P CABG (coronary artery bypass graft), 12/04/11 12/07/2011  . Hyperlipidemia LDL goal <70 12/03/2011    Class: Diagnosis of  . NSTEMI (non-ST elevated myocardial infarction) ([place]) 12/01/2011  . Family history of early CAD 12/01/2011  . Insulin dependent diabetes mellitus 12/14/2010  . DYSPHAGIA UNSPECIFIED 12/14/2010  . ABDOMINAL PAIN, EPIGASTRIC 12/14/2010  . ESOPHAGEAL STRICTURE 11/28/2009  . GERD 11/28/2009  . FLATULENCE-GAS-BLOATING 11/28/2009  . CONSTIPATION, CHRONIC 12/19/2007  . SALPINGO-OOPHORITIS 12/19/2007  . MENOPAUSE, SURGICAL 12/19/2007  . BACK PAIN, LUMBAR 12/19/2007  . ANKLE INJURY, RIGHT 12/19/2007  . TOTAL KNEE REPLACEMENT, LEFT, HX OF 12/19/2007    Past Surgical History:  Procedure Laterality Date  . ABDOMINAL HYSTERECTOMY  1980's  . ANKLE FRACTURE  SURGERY Right    "put pins both side right ankle"  . BACK SURGERY  2006   "cyst growing near my spine"  . CARDIAC CATHETERIZATION  12/02/2011   mild LV dysfunction with mod hypocontractility of mid-distal anterolateral wall; CAD w/ostial tapering of L Main with 50% diffuse ostial narrowing of LAD, 99% eccentric focal prox LAD stenosis followed by 70% prox LAD stenosis after 1st diag, 20% mid LAD narrowing; 80% ostial-to-prox L Cfx stenosis & 40-50% irregularity of RCA (Dr. Corky Downs)  . CARDIOVERSION N/A 08/11/2016   Procedure: CARDIOVERSION;  Surgeon: Lelon Perla, MD;  Location: [facility] ENDOSCOPY;  Service: Cardiovascular;  Laterality: N/A;  . CATARACT EXTRACTION W/ INTRAOCULAR LENS  IMPLANT, BILATERAL Bilateral ~ 2010  . [place]  . CORONARY ARTERY BYPASS GRAFT  12/04/2011   Procedure: CORONARY ARTERY BYPASS GRAFTING (CABG);  Surgeon: Tharon Aquas Adelene Idler, MD;  Location: [facility];  Service: Open Heart Surgery;  Laterality: N/A;  CABG x three,  using left internal mammary artery, and right leg greater saphenous vein harvested endoscopically  . CORONARY STENT INTERVENTION N/A 09/27/2018   Procedure: CORONARY STENT INTERVENTION;  Surgeon: Troy Sine, MD;  Location: [facility] CV LAB;  Service: Cardiovascular;  Laterality: N/A;  . DILATION AND CURETTAGE OF UTERUS     "a couple times"  . EP IMPLANTABLE DEVICE N/A 08/12/2016   Procedure: Pacemaker Implant;  Surgeon: Will Meredith Leeds, MD;  Location: [facility] CV LAB;  Service: Cardiovascular;  Laterality: N/A;  . ESOPHAGOGASTRODUODENOSCOPY (EGD) WITH ESOPHAGEAL DILATION    . FRACTURE SURGERY    . JOINT REPLACEMENT    . KNEE ARTHROSCOPY WITH MEDIAL MENISECTOMY Right 07/02/2014   Procedure: RIGHT KNEE ARTHROSCOPY WITH PARTIAL MEDIAL MENISTECTOMY, ABRASION CONDROPLASTYU OF PATELLA,ABRASION CONDROPLASTY OF MEDIAL FEMEROL CONDYL, MICROFRACTURE OF MEDIAL FEMEROL CONDYL;  Surgeon: Tobi Bastos, MD;  Location: [facility] ORS;  Service: Orthopedics;   Laterality: Right;  . LEFT HEART CATH AND CORS/GRAFTS ANGIOGRAPHY N/A 09/06/2018   Procedure: LEFT HEART CATH AND CORS/GRAFTS ANGIOGRAPHY;  Surgeon: Troy Sine, MD;  Location: [facility] CV LAB;  Service: Cardiovascular;  Laterality: N/A;  . LEFT HEART CATHETERIZATION WITH CORONARY ANGIOGRAM N/A 12/02/2011   Procedure: LEFT HEART CATHETERIZATION WITH CORONARY ANGIOGRAM;  Surgeon: Troy Sine, MD;  Location[facility] CATH LAB;  Service: Cardiovascular;  Laterality: N/A;  Coronary angiogram, possible PCI  . TEE WITHOUT CARDIOVERSION N/A 08/11/2016   Procedure: TRANSESOPHAGEAL ECHOCARDIOGRAM (TEE);  Surgeon: Lelon Perla, MD;  Location: [facility] ENDOSCOPY;  Service: Cardiovascular;  Laterality: N/A;  . TEE WITHOUT CARDIOVERSION N/A 05/18/2019   Procedure: TRANSESOPHAGEAL ECHOCARDIOGRAM (TEE);  Surgeon: [name], MD;  Location: [facility];  Service: Cardiovascular;  Laterality: N/A;  . TONSILLECTOMY  1949  . TOTAL KNEE ARTHROPLASTY Left ~ 2006  . TRANSTHORACIC ECHOCARDIOGRAM  02/19/2013   EF 16-10%, grade 1 diastolic dysfunction; mildly thickend/calcified AV leaflets; mildly calcidied MV annulus; mild TR     OB History   No obstetric history on file.     Family History  Problem Relation Age of Onset  . Diabetes Mother   . CVA Mother   . Hypertension Mother   . Heart disease Father   . Hyperlipidemia Father   . Breast cancer Sister   . Breast cancer Sister        x 3  . Heart disease Brother        x5; one with MI  . Heart disease Sister        x3  . Diabetes Sister        x3  . Lung cancer Sister   . Breast cancer Sister   . Colon cancer Neg Hx     Social History   Tobacco Use  . Smoking status: Never Smoker  . Smokeless tobacco: Never Used  Vaping Use  . Vaping Use: Never used  Substance Use Topics  . Alcohol use: Not Currently    Comment: very occasional   . Drug use: Never    Home Medications Prior to Admission medications   Medication Sig Start Date End  Date Taking? Authorizing Provider  acetaminophen (TYLENOL) 650 MG CR tablet Take 650 mg by mouth every 8 (eight) hours as needed for pain.    [provider]  acetaminophen (TYLENOL) 650 MG CR tablet Take 650 mg by mouth every 8 (eight) hours as needed for pain.    [provider]  allopurinol (ZYLOPRIM) 100 MG tablet Take 100  mg by mouth daily.    [provider]  allopurinol (ZYLOPRIM) 100 MG tablet Take 100 mg by mouth daily. 01/28/21   [provider]  aspirin EC 81 MG tablet Take 81 mg by mouth daily.    [provider]  carvedilol (COREG) 25 MG tablet Take 1 tablet by mouth twice daily 12/15/20   Hilty, Nadean Corwin, MD  carvedilol (COREG) 25 MG tablet Take 25 mg by mouth 2 (two) times daily. 12/15/20   [provider]  diclofenac Sodium (VOLTAREN) 1 % GEL Apply 2 g topically 2 (two) times daily as needed (pain).    [provider]  diclofenac Sodium (VOLTAREN) 1 % GEL Apply 2 g topically 4 (four) times daily as needed (arthritis).    [provider]  docusate sodium (COLACE) 100 MG capsule Take 100 mg by mouth 2 (two) times daily as needed for mild constipation or moderate constipation.    [provider]  docusate sodium (COLACE) 100 MG capsule Take 100 mg by mouth 2 (two) times daily.    [provider]  EUTHYROX 88 MCG tablet Take 88 mcg by mouth daily. 12/30/20   [provider]  ferrous sulfate 325 (65 FE) MG EC tablet Take 325 mg by mouth daily with breakfast.    [provider]  gabapentin (NEURONTIN) 300 MG capsule Take 1 capsule (300 mg total) by mouth at bedtime. 05/22/19   Roney Jaffe, MD  gabapentin (NEURONTIN) 300 MG capsule Take 300 mg by mouth at bedtime. 01/28/21   [provider]  LANTUS SOLOSTAR 100 UNIT/ML Solostar Pen Inject 22 Units into the skin daily. 08/08/18   [provider]  LANTUS SOLOSTAR 100 UNIT/ML Solostar Pen Inject 22 Units into the skin  daily. 01/01/21   [provider]  levothyroxine (SYNTHROID) 88 MCG tablet Take 88 mcg by mouth daily before breakfast. 02/07/20   [provider]  meclizine (ANTIVERT) 25 MG tablet Take 25 mg by mouth daily as needed for dizziness. For dizziness    [provider]  Multiple Vitamins-Minerals (PRESERVISION AREDS 2 PO) Take 1 tablet by mouth every evening.     [provider]  nitroGLYCERIN (NITROSTAT) 0.4 MG SL tablet Place 1 tablet (0.4 mg total) under the tongue every 5 (five) minutes as needed for chest pain (max 3 doses). Patient not taking: Reported on 12/12/2020 02/11/20 05/11/20  [name], MD  ondansetron (ZOFRAN-ODT) 4 MG disintegrating tablet Take 4 mg by mouth daily as needed. 12/05/20   [provider]  pantoprazole (PROTONIX) 40 MG tablet Take 40 mg by mouth daily. 06/24/20   [provider]  pantoprazole (PROTONIX) 40 MG tablet Take 40 mg by mouth daily. 12/29/20   [provider]  Polyethyl Glycol-Propyl Glycol (SYSTANE OP) Place 1 drop into both eyes 2 (two) times daily as needed (dry eyes).    [provider]  Polyethyl Glycol-Propyl Glycol (SYSTANE OP) Place 1 drop into both eyes daily as needed (dry eyes).    [provider]  rosuvastatin (CRESTOR) 10 MG tablet Take 10 mg by mouth daily. 03/11/20   [provider]  rosuvastatin (CRESTOR) 10 MG tablet Take 10 mg by mouth at bedtime. 01/28/21   [provider]  torsemide (DEMADEX) 20 MG tablet Take 2 tablets by mouth once daily 01/19/21   [name], MD  torsemide (DEMADEX) 20 MG tablet Take 20 mg by mouth daily. 01/19/21   [provider]    Allergies  Clonidine derivatives, Mango flavor, Sulfa antibiotics, Sulfa antibiotics, Crestor [rosuvastatin calcium], Epinephrine, Hydralazine, Losartan, and Other  Review of Systems   Review of Systems Ten systems reviewed and are negative for acute change, except as noted in the  HPI.   Physical Exam Updated Vital Signs BP (!) 196/80   Physical Exam Vitals and nursing note reviewed.  Constitutional:      General: She is not in acute distress.    Appearance: She is well-developed. She is not diaphoretic.  HENT:     Head: Normocephalic and atraumatic.     Nose:     Right Nostril: Epistaxis present.     Left Nostril: No epistaxis.  Eyes:     General: No scleral icterus.    Conjunctiva/sclera: Conjunctivae normal.  Cardiovascular:     Rate and Rhythm: Normal rate and regular rhythm.     Heart sounds: Normal heart sounds. No murmur heard. No friction rub. No gallop.   Pulmonary:     Effort: Pulmonary effort is normal. No respiratory distress.     Breath sounds: Normal breath sounds.  Abdominal:     General: Bowel sounds are normal. There is no distension.     Palpations: Abdomen is soft. There is no mass.     Tenderness: There is no abdominal tenderness. There is no guarding.  Musculoskeletal:     Cervical back: Normal range of motion.  Skin:    General: Skin is warm and dry.  Neurological:     Mental Status: She is alert and oriented to person, place, and time.  Psychiatric:        Behavior: Behavior normal.     ED Results / Procedures / Treatments   Labs (all labs ordered are listed, but only abnormal results are displayed) Labs Reviewed - No data to display  EKG None  Radiology No results found.  Procedures .Epistaxis Management  Date/Time: 03/22/2021 4:36 PM Performed by: Margarita Mail, PA-C Authorized by: Margarita Mail, PA-C   Consent:    Consent obtained:  Verbal   Consent given by:  Patient   Risks, benefits, and alternatives were discussed: yes     Risks discussed:  Bleeding, infection, nasal injury and pain   Alternatives discussed:  No treatment Universal protocol:    Patient identity confirmed:  Provided demographic data Procedure details:    Treatment site:  R anterior   Treatment method:  Anterior pack    Treatment complexity:  Limited   Treatment episode: initial   Post-procedure details:    Assessment:  Bleeding stopped   Procedure completion:  Tolerated well, no immediate complications   Medications Ordered in ED Medications - No data to display  ED Course  I have reviewed the triage vital signs and the nursing notes.  Pertinent labs & imaging results that were available during my care of the patient were reviewed by me and considered in my medical decision making (see chart for details).    MDM Rules/Calculators/A&P                          Patient here with right-sided anterior epistaxis.  Unable to visualize area of bleeding.  I packed her nose with lidocaine with epi and this active metolazone soaked gauze.  This was left packed for approximately 40 minutes with complete resolution of her bleeding.  After removal of packing she was watched for another hour without any return of her bleeding.  I reviewed patient's labs which  are at baseline with her both her hemoglobin and chronic renal insufficiency.  Patient was also given her normal dose of hypertensive medicine which improved her blood pressure.  Patient discharged with nasal hygiene.  Updated family members.  Appears otherwise appropriate for discharge. Final Clinical Impression(s) / ED Diagnoses Final diagnoses:  None    Rx / DC Orders ED Discharge Orders    None       Margarita Mail, PA-C 03/22/21 1638    Tegeler, Gwenyth Allegra, MD 03/23/21 [phone]

## 2021-03-22 NOTE — Discharge Instructions (Addendum)
If you begin having bleeding again unroll and soak 1 of those cotton balls with the oxine metolazone and pack the nostril with it.  Apply direct pressure to the nose by holding the soft part of your nose for at least 20 seconds.  Do not let go.  If your bleeding will not stop please call 911 and come back.  Nasal Hygiene The nose has many positive effects on the air you breathe in that you may not be aware of. - Temperature regulation - Filtration and removal of particulate matter - Humidification - Defense against infections There are several things you can do to help keep your nose healthy. Foremost is nasal hygiene. This will help with your nose's natural function and keep it moist and healthy. Techniques to accomplish this are outlined below. These will help with nasal dryness, nasal crusting, and nose bleeds. They also assist with clearing thick mucus that cause you to blow your nose frequently and may be associated with thick postnasal drip.  1. Use nasal saline daily. You can buy small bottles of this over-the-counter at the drug store or grocery store. Some brand names are: [place], [place], [place]. Apply 2-3 sprays each nostril several times a day. If your nose feels dry or have had recent nasal surgery, try to use it every couple of hours. There is no medicine in it so it can be used as often as you like. Do NOT use the sprays containing decongestants. The appropriate way to apply nasal sprays: Place the nozzle just inside your nostril and point it towards the corner of your eye. Often, it is helpful to use the right-hand to spray into the left nasal cavity and use the left-hand to spray into the right side.  2. Use nasal saline irrigations and flushing.SinuCleanse, Simply Saline, [place]. Use this 1-2 times a day. We can also provide you with a recipe to use at home. Just ask [place]. To prevent reintroducing bacteria back into your nose, please keep your irrigation equipment clean and  dry between uses. Throw away and replace reusable irrigation equipment every 3 weeks.   3. Use Vaseline petroleum jelly or Aquaphor. You can apply this gently to each nostril 2-3 times a day to promote moisturization for your nose. You may also use triple antibiotic ointment such as Neosporin or Bacitracin. These can all be bought over-the-counter.  4. Consider other nasal emollients. A few preparations are available over-thecounter. Ponaris, Nose Better, Pretz. Ask your pharmacist what is available. Also, some nasal saline sprays have additives such as aloe and these are helpful.  5. Consider using a humidifier at home. If your nose feels dry and/or you have frequent nose bleeds, you can buy a humidifier for your home. Be cautious in using these if you have mold allergies.  6. Avoid excessive manual manipulation of your nose and nostrils. Frequent rubbing of your nostrils and the passing of tissues or fingers in your nostrils may aggravate nasal irritation from dryness and nose bleeds.

## 2021-03-22 NOTE — ED Notes (Signed)
Pt placed in car with family Debbra Vandevander.

## 2021-03-31 ENCOUNTER — Encounter: Payer: Self-pay | Admitting: Physical Therapy

## 2021-03-31 ENCOUNTER — Telehealth: Payer: Self-pay | Admitting: *Deleted

## 2021-03-31 ENCOUNTER — Ambulatory Visit: Payer: Medicare Other | Attending: Internal Medicine | Admitting: Physical Therapy

## 2021-03-31 DIAGNOSIS — R2689 Other abnormalities of gait and mobility: Secondary | ICD-10-CM | POA: Diagnosis not present

## 2021-03-31 DIAGNOSIS — R2681 Unsteadiness on feet: Secondary | ICD-10-CM | POA: Insufficient documentation

## 2021-03-31 DIAGNOSIS — Z9181 History of falling: Secondary | ICD-10-CM | POA: Diagnosis not present

## 2021-03-31 DIAGNOSIS — M6281 Muscle weakness (generalized): Secondary | ICD-10-CM | POA: Insufficient documentation

## 2021-03-31 DIAGNOSIS — R42 Dizziness and giddiness: Secondary | ICD-10-CM | POA: Insufficient documentation

## 2021-03-31 NOTE — Therapy (Signed)
[facility] [address] [place], [place], [address] Phone: [phone]   Fax:  [phone]  Physical Therapy Evaluation  Patient Details  Name: [name] MRN: [id number] Date of Birth: Sep 23, 1941 Referring Provider (PT): Delrae Rend, MD   Encounter Date: 03/31/2021   PT End of Session - 03/31/21 1635    Visit Number 1    Number of Visits 9    Date for PT Re-Evaluation 05/30/21    Authorization Type U[employer] Medicare    PT Start Time 1321   pt arrived later   PT Stop Time 1400    PT Time Calculation (min) 39 min    Equipment Utilized During Treatment Gait belt    Activity Tolerance Patient tolerated treatment well    Behavior During Therapy [facility] for tasks assessed/performed           Past Medical History:  Diagnosis Date  . Anemia   . Anxiety   . Arthritis    "in my hands; knees, back" (09/27/2018)  . CAD (coronary artery disease)    a. 40-59% bilaterally 10/2015.  Marland Kitchen Chronic diastolic CHF (congestive heart failure) ([place])    a. 05/2016 Echo: EF 60-65%, no rwma, Gr1 DD, Ao sclerosis w/o stenosis, triv MR;  b. 07/2016 TEE: EF 55-60%, no rwma, mild MR.  . Chronic headaches   . CKD (chronic kidney disease), stage III ([place])   . Coronary artery disease    a. 02/2007 Pers[place] MV: low risk;  b. 11/2011 CABG x 3 (LIMA->LAD, VG->OM, VG->RCA);  c. 05/2016 MV: EF >65%, no isch/infarct, horiz ST dep in I, II, V5-V6.  Marland Kitchen Depression   . Diverticulosis   . Esophageal stricture   . GERD (gastroesophageal reflux disease)   . Hemorrhoids   . Hiatal hernia   . Hyperkalemia    a. ARB stopped due to this.  . Hyperlipidemia   . Hypertension   . Hypertensive heart disease   . Hypothyroidism   . Major depressive disorder with anxious distress 09/05/2019  . Mild cognitive impairment    a. seen by neurology.  . Mild vascular neurocognitive disorder ([place]) 09/05/2019  . Myocardial infarction ([place]) 12/07/2011  . PAF (paroxysmal  atrial fibrillation) ([facility])    a. post-op CABG.  . Pain    RIGHT KNEE PAIN - TORN RIGHT MEDIAL MENISCUS  . Paroxysmal atrial flutter ([place])    a. 07/2016 s/p TEE & DCCV;  b. 07/2016 Recurrent PAFlutter req initiation of amio & PPM in setting of tachy-brady;  c. CHA2DS2VASc = 7-->Xarelto 15 mg QD.  Marland Kitchen Pneumonia    "twice" (09/27/2018)  . PONV (postoperative nausea and vomiting)   . Presence of permanent cardiac pacemaker 08/12/2016  . S/P CABG (coronary artery bypass graft), 12/04/11 12/07/2011   LIMA to LAD, SVG to OM, SVG to RCA  . Sinus bradycardia    a. not on BB due to this.  . Skin cancer    "face" (09/27/2018)  . Tachy-brady syndrome ([place])    a. 07/2016 Jxnl brady following DCCV, recurrent Aflutter-->amio + SJM 2272 [employer]urity MRI DC PPM (ser # [id number]).  . Type II diabetes mellitus ([place])     Past Surgical History:  Procedure Laterality Date  . ABDOMINAL HYSTERECTOMY  1980's  . ANKLE FRACTURE SURGERY Right    "put pins both side right ankle"  . BACK SURGERY  2006   "cyst growing near my spine"  . CARDIAC CATHETERIZATION  12/02/2011   mild LV dysfunction with mod  hypocontractility of mid-distal anterolateral wall; CAD w/ostial tapering of L Main with 50% diffuse ostial narrowing of LAD, 99% eccentric focal prox LAD stenosis followed by 70% prox LAD stenosis after 1st diag, 20% mid LAD narrowing; 80% ostial-to-prox L Cfx stenosis & 40-50% irregularity of RCA (Dr. Corky Downs)  . CARDIOVERSION N/A 08/11/2016   Procedure: CARDIOVERSION;  Surgeon: Lelon Perla, MD;  Location: [facility] ENDOSCOPY;  Service: Cardiovascular;  Laterality: N/A;  . CATARACT EXTRACTION W/ INTRAOCULAR LENS  IMPLANT, BILATERAL Bilateral ~ 2010  . [place]  . CORONARY ARTERY BYPASS GRAFT  12/04/2011   Procedure: CORONARY ARTERY BYPASS GRAFTING (CABG);  Surgeon: Tharon Aquas Adelene Idler, MD;  Location: [facility];  Service: Open Heart Surgery;  Laterality: N/A;  CABG x three,  using left internal mammary artery, and right  leg greater saphenous vein harvested endoscopically  . CORONARY STENT INTERVENTION N/A 09/27/2018   Procedure: CORONARY STENT INTERVENTION;  Surgeon: Troy Sine, MD;  Location: [facility] CV LAB;  Service: Cardiovascular;  Laterality: N/A;  . DILATION AND CURETTAGE OF UTERUS     "a couple times"  . EP IMPLANTABLE DEVICE N/A 08/12/2016   Procedure: Pacemaker Implant;  Surgeon: Will Meredith Leeds, MD;  Location: [facility] CV LAB;  Service: Cardiovascular;  Laterality: N/A;  . ESOPHAGOGASTRODUODENOSCOPY (EGD) WITH ESOPHAGEAL DILATION    . FRACTURE SURGERY    . JOINT REPLACEMENT    . KNEE ARTHROSCOPY WITH MEDIAL MENISECTOMY Right 07/02/2014   Procedure: RIGHT KNEE ARTHROSCOPY WITH PARTIAL MEDIAL MENISTECTOMY, ABRASION CONDROPLASTYU OF PATELLA,ABRASION CONDROPLASTY OF MEDIAL FEMEROL CONDYL, MICROFRACTURE OF MEDIAL FEMEROL CONDYL;  Surgeon: Tobi Bastos, MD;  Location: [facility] ORS;  Service: Orthopedics;  Laterality: Right;  . LEFT HEART CATH AND CORS/GRAFTS ANGIOGRAPHY N/A 09/06/2018   Procedure: LEFT HEART CATH AND CORS/GRAFTS ANGIOGRAPHY;  Surgeon: Troy Sine, MD;  Location: [facility] CV LAB;  Service: Cardiovascular;  Laterality: N/A;  . LEFT HEART CATHETERIZATION WITH CORONARY ANGIOGRAM N/A 12/02/2011   Procedure: LEFT HEART CATHETERIZATION WITH CORONARY ANGIOGRAM;  Surgeon: Troy Sine, MD;  Location: [facility] CATH LAB;  Service: Cardiovascular;  Laterality: N/A;  Coronary angiogram, possible PCI  . TEE WITHOUT CARDIOVERSION N/A 08/11/2016   Procedure: TRANSESOPHAGEAL ECHOCARDIOGRAM (TEE);  Surgeon: Lelon Perla, MD;  Location: [facility] ENDOSCOPY;  Service: Cardiovascular;  Laterality: N/A;  . TEE WITHOUT CARDIOVERSION N/A 05/18/2019   Procedure: TRANSESOPHAGEAL ECHOCARDIOGRAM (TEE);  Surgeon: [name], MD;  Location: [facility];  Service: Cardiovascular;  Laterality: N/A;  . TONSILLECTOMY  1949  . TOTAL KNEE ARTHROPLASTY Left ~ 2006  . TRANSTHORACIC ECHOCARDIOGRAM  02/19/2013    EF 11-94%, grade 1 diastolic dysfunction; mildly thickend/calcified AV leaflets; mildly calcidied MV annulus; mild TR    There were no vitals filed for this visit.    Subjective Assessment - 03/31/21 1323    Subjective Reports having issues with her balance for a while. Walks in holding her daughter's arm and SPC. When she is in the house, she does not use her cane. Has had a couple falls in the past 6 months. Reports that she will sometimes get dizzy during the day (unable to think of what makes her dizzy - maybe turning or turning her head). Sleeps in the reclining portion of the couch. Daughter reports that some days she is weaker than others. Has HHPT in the past and pt's daughter reports that she won't continue the exercises. pt's daughter also reports that pt has trouble picking up her feet.  Patient is accompained by: Family member   pt's daughter [name]   Pertinent History DM2, tachy-brady syndrome, skin cancer, s/p CABG 12/04/11, presence of permanent cardiac pacemaker, paroxysmal atrial flutter,  MI, mild vascular neurocognitive disorder, depression, hypothyroisidm, HTN, hiatal hernia, GERD,  CAD, CKD stage 3, CHF, arthritis, and anemia.    Limitations Walking    Patient Stated Goals wants to learn to walk better.    Currently in Pain? No/denies              [facility] PT Assessment - 03/31/21 1328      Assessment   Medical Diagnosis falls/imbalance    Referring Provider (PT) Delrae Rend, MD    Onset Date/Surgical Date 03/19/21    Prior Therapy previous PT at this location in 2017, HHPT      Precautions   Precautions Fall    Precaution Comments hard of hearing, no driving      Balance Screen   Has the patient fallen in the past 6 months Yes    How many times? 2-3    Has the patient had a decrease in activity level because of a fear of falling?  Yes   used to be able to go out and walk around her yard   Is the patient reluctant to leave their home because of a fear of  falling?  No   as long as she has someone with her     [place] to enter    Entrance Stairs-Number of Steps 3    [place] to live on main level with bedroom/bathroom    Home Equipment [place] - single point;Walker - standard   has a RW, does not use it   Additional Comments daughter is about 20 minutes away, have friends that come by to check in on her, has a bed rail to help with getting in and out of bed. has someone that comes in every couple weeks to help with cleaning      Prior Function   Level of Independence Independent      Sensation   Light Touch Appears Intact    Additional Comments reports N/T in feet due to neuropathy      Coordination   Gross Motor Movements are Fluid and Coordinated No    Heel Shin Test harder to perform with LLE (pt reporting having a knee replacement on that side)      ROM / Strength   AROM / PROM / Strength Strength      Strength   Strength Assessment Site Hip;Knee;Ankle    Right/Left Hip Right;Left    Right Hip Flexion 4/5    Left Hip Flexion 4-/5    Right/Left Knee Right;Left    Right Knee Flexion 5/5    Right Knee Extension 5/5    Left Knee Flexion 4/5    Left Knee Extension 4+/5    Right/Left Ankle Right;Left    Right Ankle Dorsiflexion 5/5    Left Ankle Dorsiflexion 5/5      Transfers   Transfers Sit to Stand;Stand to Sit    Sit to Stand 5: Supervision    Five time sit to stand comments  26.84 seconds without UE support from standard height chair, with LLE staggered posteriorly    Stand to Sit 5: Supervision  Ambulation/Gait   Ambulation/Gait Yes    Ambulation/Gait Assistance 4: Min guard    Assistive device Straight cane    Gait Pattern Step-through pattern;Decreased step length - right;Decreased step length - left;Decreased arm swing - left;Decreased dorsiflexion -  right;Decreased dorsiflexion - left    Ambulation Surface Level;Indoor    Gait velocity 28.03 seconds = 1.17 ft/sec      Standardized Balance Assessment   Standardized Balance Assessment Timed Up and Go Test      Timed Up and Go Test   Normal TUG (seconds) 24.78    TUG Comments with SPC, got unsteady when turning to sit back down to chair                      Objective measurements completed on examination: See above findings.               PT Education - 03/31/21 1634    Education Details clinical findings, areas to address in therapy, POC (discussed co-pay and due to $30, pt is able to come 1x per week)    Person(s) Educated Patient;Child(ren)    Methods Explanation    Comprehension Verbalized understanding            PT Short Term Goals - 03/31/21 1636      PT SHORT TERM GOAL #1   Title Pt will undergo further vestibular assessment with LTG to be written as appropriate.    Time 4    Period Weeks    Status New      PT SHORT TERM GOAL #2   Title Perform BERG with LTG to be written as appropriate.    Time 4    Period Weeks    Status New      PT SHORT TERM GOAL #3   Title Pt and pt's daughter will verbalize understanding of fall prevention in the home environment.    Time 4    Period Weeks    Status New      PT SHORT TERM GOAL #4   Title Pt will improve gait speed to at least 1.4 ft/sec with SPC vs. appropriate AD in order to demo decr fall risk.    Baseline 1.17 ft/sec    Time 4    Period Weeks    Status New             PT Long Term Goals - 03/31/21 1638      PT LONG TERM GOAL #1   Title Vestibular goal to be written as appropriate. ALL LTGS DUE 05/26/21    Baseline not yet assessed.    Time 8    Period Weeks    Status New    Target Date 05/26/21      PT LONG TERM GOAL #2   Title BERG goal to be written as appropriate in order to demo decr fall risk.    Baseline did not have time to perform during eval    Time 8    Period  Weeks    Status New      PT LONG TERM GOAL #3   Title Pt will decr TUG time to 20 seconds or less with SPC in order to demo decr fall risk.    Baseline 24.78 seconds    Time 8    Period Weeks    Status New      PT LONG TERM GOAL #4   Title Pt will improve 5x sit <> stand time to  20 seconds or less without UE support in order to demo improved BLE strength and balance.    Baseline 26.84 seconds    Time 8    Period Weeks    Status New      PT LONG TERM GOAL #5   Title Pt will be independent with final HEP with daughter's supervision in order to build upon functional gains made in therapy    Time [place] - 03/31/21 1641    Clinical Impression Statement Patient is a 80 year old female referred to Neuro OPPT for hx of falls and imbalance.   Pt's PMH is significant for: DM2, tachy-brady syndrome, skin cancer, s/p CABG 12/04/11, presence of permanent cardiac pacemaker, paroxysmal atrial flutter,  MI, mild vascular neurocognitive disorder, depression, hypothyroisidm, HTN, hiatal hernia, GERD,  CAD, CKD stage 3, CHF, arthritis, and anemia. The following deficits were present during the exam: impaired balance, gait abnormalities, decr coordination, impaired strength, decr safety awareness. Pt also reports dizziness with quick movements (such as turns or head motions) - will further assess at next visit. Based on TUG, 5x sit <> Stand gait speed with SPC, pt is an incr risk for falls. Pt would benefit from skilled PT to address these impairments and functional limitations to maximize functional mobility independence and decr fall risk.    Personal Factors and Comorbidities Time since onset of injury/illness/exacerbation;Past/Current Experience;Comorbidity 3+    Comorbidities DM2, tachy-brady syndrome, skin cancer, s/p CABG 12/04/11, presence of permanent cardiac pacemaker, paroxysmal atrial flutter,  MI, mild vascular neurocognitive disorder,  depression, hypothyroisidm, HTN, hiatal hernia, GERD,  CAD, CKD stage 3, CHF, arthritis, and anemia.    Examination-Activity Limitations Stairs;Transfers;Locomotion Level;Bend    Examination-Participation Restrictions Community Activity;Cleaning;Driving    Stability/Clinical Decision Making Evolving/Moderate complexity    Clinical Decision Making Moderate    Rehab Potential Good    PT Frequency 1x / week    PT Duration 8 weeks    PT Treatment/Interventions ADLs/Self Care Home Management;Canalith Repostioning;Functional mobility training;Stair training;Gait training;Therapeutic activities;Therapeutic exercise;DME Instruction;Balance training;Neuromuscular re-education;Patient/family education;Vestibular    PT Next Visit Plan further vestibular assessment. initial HEP for counter balance and vestib deficits as appropriate. perform BERG with goal to be written as appropriate.    Consulted and Agree with Plan of Care Patient;Family member/caregiver    Family Member Consulted pt's daughter, [name]           Patient will benefit from skilled therapeutic intervention in order to improve the following deficits and impairments:  Abnormal gait,Decreased activity tolerance,Decreased balance,Decreased coordination,Decreased safety awareness,Dizziness,Decreased strength,Difficulty walking,Impaired sensation  Visit Diagnosis: Unsteadiness on feet  Dizziness and giddiness  History of falling  Other abnormalities of gait and mobility  Muscle weakness (generalized)     Problem List Patient Active Problem List   Diagnosis Date Noted  . Acute kidney injury superimposed on chronic kidney disease ([place]) 10/03/2020  . Nausea & vomiting 10/03/2020  . Abdominal pain 10/03/2020  . Mild vascular neurocognitive disorder 09/05/2019  . Major depressive disorder with anxious distress 09/05/2019  . Coag negative Staphylococcus bacteremia 05/21/2019  . Acute blood loss anemia 05/21/2019  . Anemia of  chronic disease 05/21/2019  . Anemia in chronic kidney disease 05/21/2019  . SSS (sick sinus syndrome) ([place])   . Paroxysmal atrial fibrillation (H[facility])   . Acute metabolic encephalopathy   . Retroperitoneal hematoma   .  Muscle hemorrhage   . S/P placement of cardiac pacemaker   . SIRS (systemic inflammatory response syndrome) ([place]) 05/15/2019  . Acute encephalopathy 05/15/2019  . Status post coronary artery stent placement   . CAD S/P percutaneous coronary angioplasty 09/27/2018  . Abnormal nuclear stress test   . Pain of left shoulder joint on movement 06/16/2018  . Closed fracture of upper end of humerus 06/16/2018  . Pain in right knee 04/12/2018  . Spinal stenosis of lumbar region 02/28/2018  . On anticoagulant therapy 04/22/2017  . Tachycardia-bradycardia syndrome ([place]) 12/23/2016  . Accelerated hypertension   . Chronic diastolic CHF (congestive heart failure) ([place]) 08/09/2016  . Elevated troponin 08/09/2016  . Hypertensive urgency 08/08/2016  . Paroxysmal atrial flutter ([place]) 08/08/2016  . Coronary artery disease   . Hypertensive heart disease   . CKD (chronic kidney disease) stage 4, GFR 15-29 ml/min (H[facility])   . Other specified hypothyroidism   . Acute anemia   . Pain in the chest   . Shortness of breath 05/23/2016  . Pre-syncope 05/23/2016  . Chest tightness 09/16/2014  . Osteoarthritis of right knee 07/02/2014  . Acute medial meniscus tear of right knee 07/02/2014  . Diabetic peripheral neuropathy associated with type 1 diabetes mellitus ([place]) 09/05/2013  . Edema of extremities 04/18/2013  . Cellulitis 04/18/2013  . Essential hypertension 01/06/2012  . Acute renal failure,admitted 01/03/12, after admission for diastolic chf 01/06/9457  . S/P CABG (coronary artery bypass graft), 12/04/11 12/07/2011  . Hyperlipidemia LDL goal <70 12/03/2011    Class: Diagnosis of  . NSTEMI (non-ST elevated myocardial infarction) ([place]) 12/01/2011  . Family history of early CAD 12/01/2011   . Insulin dependent diabetes mellitus 12/14/2010  . DYSPHAGIA UNSPECIFIED 12/14/2010  . ABDOMINAL PAIN, EPIGASTRIC 12/14/2010  . ESOPHAGEAL STRICTURE 11/28/2009  . GERD 11/28/2009  . FLATULENCE-GAS-BLOATING 11/28/2009  . CONSTIPATION, CHRONIC 12/19/2007  . SALPINGO-OOPHORITIS 12/19/2007  . MENOPAUSE, SURGICAL 12/19/2007  . BACK PAIN, LUMBAR 12/19/2007  . ANKLE INJURY, RIGHT 12/19/2007  . TOTAL KNEE REPLACEMENT, LEFT, HX OF 12/19/2007    Arliss Journey, PT, DPT  03/31/2021, 4:48 PM  [facility] [address] [place], [place], [address] Phone: [phone]   Fax:  [phone]  Name: [name] MRN: [id number] Date of Birth: Nov 18, 1940

## 2021-03-31 NOTE — Telephone Encounter (Signed)
Left message to call back  

## 2021-03-31 NOTE — Telephone Encounter (Signed)
-----   Message from Elmer Ramp sent at 02/09/2021 10:09 AM EDT ----- Regarding: Recall Appointment This is the patient that was very confused. I did not get her appointment scheduled for a 1 year follow up. If you need me to help with anything let me know!   Thank you, Josephina Gip

## 2021-04-07 ENCOUNTER — Encounter: Payer: Self-pay | Admitting: Physical Therapy

## 2021-04-07 ENCOUNTER — Ambulatory Visit: Payer: Medicare Other | Admitting: Physical Therapy

## 2021-04-07 ENCOUNTER — Other Ambulatory Visit: Payer: Self-pay

## 2021-04-07 VITALS — BP 150/65

## 2021-04-07 DIAGNOSIS — R2681 Unsteadiness on feet: Secondary | ICD-10-CM | POA: Diagnosis not present

## 2021-04-07 DIAGNOSIS — R42 Dizziness and giddiness: Secondary | ICD-10-CM | POA: Diagnosis not present

## 2021-04-07 DIAGNOSIS — R2689 Other abnormalities of gait and mobility: Secondary | ICD-10-CM

## 2021-04-07 DIAGNOSIS — M6281 Muscle weakness (generalized): Secondary | ICD-10-CM | POA: Diagnosis not present

## 2021-04-07 DIAGNOSIS — Z9181 History of falling: Secondary | ICD-10-CM | POA: Diagnosis not present

## 2021-04-07 NOTE — Patient Instructions (Signed)
Gaze Stabilization: Sitting    Keeping eyes on target on wall 2-3 feet away, tilt head down 15-30 and move head side to side for 30 seconds. Repeat while moving head up and down for 30 seconds. Do 2 sessions per day.   Gaze Stabilization: Tip Card  1.Target must remain in focus, not blurry, and appear stationary while head is in motion. 2.Perform exercises with small head movements (45 to either side of midline). 3.Increase speed of head motion so long as target is in focus. 4.If you wear eyeglasses, be sure you can see target through lens (therapist will give specific instructions for bifocal / progressive lenses). 5.These exercises may provoke dizziness or nausea. Work through these symptoms. If too dizzy, slow head movement slightly. Rest between each exercise. 6.Exercises demand concentration; avoid distractions. 7.For safety, perform standing exercises close to a counter, wall, corner, or next to someone.  Copyright  VHI. All rights reserved.

## 2021-04-07 NOTE — Therapy (Signed)
[facility] [address] [place], [place], [address] Phone: [phone]   Fax:  [phone]  Physical Therapy Treatment  Patient Details  Name: [name] MRN: [id number] Date of Birth: August 03, 1941 Referring Provider (PT): Delrae Rend, MD   Encounter Date: 04/07/2021   PT End of Session - 04/07/21 1639     Visit Number 2    Number of Visits 9    Date for PT Re-Evaluation 05/30/21    Authorization Type [employer] Medicare    PT Start Time 1448    PT Stop Time 1532    PT Time Calculation (min) 44 min    Equipment Utilized During Treatment Gait belt    Activity Tolerance Patient tolerated treatment well    Behavior During Therapy [facility] for tasks assessed/performed             Past Medical History:  Diagnosis Date   Anemia    Anxiety    Arthritis    "in my hands; knees, back" (09/27/2018)   CAD (coronary artery disease)    a. 40-59% bilaterally 10/2015.   Chronic diastolic CHF (congestive heart failure) ([place])    a. 05/2016 Echo: EF 60-65%, no rwma, Gr1 DD, Ao sclerosis w/o stenosis, triv MR;  b. 07/2016 TEE: EF 55-60%, no rwma, mild MR.   Chronic headaches    CKD (chronic kidney disease), stage III (HCC)    Coronary artery disease    a. 02/2007 Pers[place] MV: low risk;  b. 11/2011 CABG x 3 (LIMA->LAD, VG->OM, VG->RCA);  c. 05/2016 MV: EF >65%, no isch/infarct, horiz ST dep in I, II, V5-V6.   Depression    Diverticulosis    Esophageal stricture    GERD (gastroesophageal reflux disease)    Hemorrhoids    Hiatal hernia    Hyperkalemia    a. ARB stopped due to this.   Hyperlipidemia    Hypertension    Hypertensive heart disease    Hypothyroidism    Major depressive disorder with anxious distress 09/05/2019   Mild cognitive impairment    a. seen by neurology.   Mild vascular neurocognitive disorder ([place]) 09/05/2019   Myocardial infarction ([place]) 12/07/2011   PAF (paroxysmal atrial fibrillation) ([place])    a.  post-op CABG.   Pain    RIGHT KNEE PAIN - TORN RIGHT MEDIAL MENISCUS   Paroxysmal atrial flutter ([place])    a. 07/2016 s/p TEE & DCCV;  b. 07/2016 Recurrent PAFlutter req initiation of amio & PPM in setting of tachy-brady;  c. CHA2DS2VASc = 7-->Xarelto 15 mg QD.   Pneumonia    "twice" (09/27/2018)   PONV (postoperative nausea and vomiting)    Presence of permanent cardiac pacemaker 08/12/2016   S/P CABG (coronary artery bypass graft), 12/04/11 12/07/2011   LIMA to LAD, SVG to OM, SVG to RCA   Sinus bradycardia    a. not on BB due to this.   Skin cancer    "face" (09/27/2018)   Tachy-brady syndrome ([place])    a. 07/2016 Jxnl brady following DCCV, recurrent Aflutter-->amio + SJM 2[address] [employer] MRI DC PPM (ser # [id number]).   Type II diabetes mellitus ([place])     Past Surgical History:  Procedure Laterality Date   ABDOMINAL HYSTERECTOMY  1980's   ANKLE FRACTURE SURGERY Right    "put pins both side right ankle"   BACK SURGERY  2006   "cyst growing near my spine"   CARDIAC CATHETERIZATION  12/02/2011   mild LV dysfunction with mod hypocontractility  of mid-distal anterolateral wall; CAD w/ostial tapering of L Main with 50% diffuse ostial narrowing of LAD, 99% eccentric focal prox LAD stenosis followed by 70% prox LAD stenosis after 1st diag, 20% mid LAD narrowing; 80% ostial-to-prox L Cfx stenosis & 40-50% irregularity of RCA (Dr. Corky Downs)   CARDIOVERSION N/A 08/11/2016   Procedure: CARDIOVERSION;  Surgeon: Lelon Perla, MD;  Location: [facility] ENDOSCOPY;  Service: Cardiovascular;  Laterality: N/A;   CATARACT EXTRACTION W/ INTRAOCULAR LENS  IMPLANT, BILATERAL Bilateral ~ 2010   [place] GRAFT  12/04/2011   Procedure: CORONARY ARTERY BYPASS GRAFTING (CABG);  Surgeon: Tharon Aquas Adelene Idler, MD;  Location: [place];  Service: Open Heart Surgery;  Laterality: N/A;  CABG x three,  using left internal mammary artery, and right leg greater saphenous vein harvested endoscopically    CORONARY STENT INTERVENTION N/A 09/27/2018   Procedure: CORONARY STENT INTERVENTION;  Surgeon: Troy Sine, MD;  Location: [facility] CV LAB;  Service: Cardiovascular;  Laterality: N/A;   DILATION AND CURETTAGE OF UTERUS     "a couple times"   EP IMPLANTABLE DEVICE N/A 08/12/2016   Procedure: Pacemaker Implant;  Surgeon: Will Meredith Leeds, MD;  Location: [place] [facility];  Service: Cardiovascular;  Laterality: N/A;   ESOPHAGOGASTRODUODENOSCOPY (EGD) WITH ESOPHAGEAL DILATION     FRACTURE SURGERY     JOINT REPLACEMENT     KNEE ARTHROSCOPY WITH MEDIAL MENISECTOMY Right 07/02/2014   Procedure: RIGHT KNEE ARTHROSCOPY WITH PARTIAL MEDIAL MENISTECTOMY, ABRASION CONDROPLASTYU OF PATELLA,ABRASION CONDROPLASTY OF MEDIAL FEMEROL CONDYL, MICROFRACTURE OF MEDIAL FEMEROL CONDYL;  Surgeon: Tobi Bastos, MD;  Location: [facility] ORS;  Service: Orthopedics;  Laterality: Right;   LEFT HEART CATH AND CORS/GRAFTS ANGIOGRAPHY N/A 09/06/2018   Procedure: LEFT HEART CATH AND CORS/GRAFTS ANGIOGRAPHY;  Surgeon: Troy Sine, MD;  Location: [place] [facility];  Service: Cardiovascular;  Laterality: N/A;   LEFT HEART CATHETERIZATION WITH CORONARY ANGIOGRAM N/A 12/02/2011   Procedure: LEFT HEART CATHETERIZATION WITH CORONARY ANGIOGRAM;  Surgeon: Troy Sine, MD;  Location: [facility] CATH LAB;  Service: Cardiovascular;  Laterality: N/A;  Coronary angiogram, possible PCI   TEE WITHOUT CARDIOVERSION N/A 08/11/2016   Procedure: TRANSESOPHAGEAL ECHOCARDIOGRAM (TEE);  Surgeon: Lelon Perla, MD;  Location[facility] END[facility]COPY;  Service: Cardiovascular;  Laterality: N/A;   TEE WITHOUT CARDIOVERSION N/A 05/18/2019   Procedure: TRANSESOPHAGEAL ECHOCARDIOGRAM (TEE);  Surgeon: [name], MD;  Location: [facility] ENDOSCOPY;  Service: Cardiovascular;  Laterality: N/A;   [place] Left ~ 2006   TRANSTHORACIC ECHOCARDIOGRAM  02/19/2013   EF 16-10%, grade 1 diastolic dysfunction; mildly thickend/calcified  AV leaflets; mildly calcidied MV annulus; mild TR    Vitals:   04/07/21 1453  BP: (!) 150/65     Subjective Assessment - 04/07/21 1450     Subjective No changes, no falls.    Patient is accompained by: Family member   pt's daughter [name]   Pertinent History DM2, tachy-brady syndrome, skin cancer, s/p CABG 12/04/11, presence of permanent cardiac pacemaker, paroxysmal atrial flutter,  MI, mild vascular neurocognitive disorder, depression, hypothyroisidm, HTN, hiatal hernia, GERD,  CAD, CKD stage 3, CHF, arthritis, and anemia.    Limitations Walking    Patient Stated Goals wants to learn to walk better.    Currently in Pain? No/denies                     Vestibular Assessment - 04/07/21 1451  Vestibular Assessment   General Observation pt ambulated in with SPC. when turning to turn into chair in waiitng room felt dizzy for a couple seconds. reports lightheadedness every once in a while      Symptom Behavior   Type of Dizziness  Imbalance    Frequency of Dizziness some days and some days she doesn't    Duration of Dizziness can last a couple seconds, or other times it can last on/off all day    Symptom Nature Motion provoked    Aggravating Factors Turning head quickly;Turning body quickly;Forward bending    Relieving Factors Comments   being still   Progression of Symptoms Worse    History of similar episodes had PT at this location for dizziness in 2017      Oculomotor Exam   Oculomotor Alignment Normal    Ocular ROM slow, difficult at times to follow with just eyes, tended to turn head, mild dizziness    Spontaneous Absent    Smooth Pursuits Comment   difficulty maintaining   Saccades Hypometric;Slow   most difficult with midline > R     Oculomotor Exam-Fixation Suppressed    Left Head Impulse positive    Right Head Impulse negative      Vestibulo-Ocular Reflex   VOR to Slow Head Movement Normal   mild dizziness   VOR Cancellation Comment   one instance  of not being able to maintain gaze, otherwise [facility]     Positional Testing   Dix-Hallpike Dix-Hallpike Right;Dix-Hallpike Left    Horizontal Canal Testing Horizontal Canal Right;Horizontal Canal Left      Dix-Hallpike Right   Dix-Hallpike Right Symptoms No nystagmus      Dix-Hallpike Left   Dix-Hallpike Left Symptoms No nystagmus      Horizontal Canal Right   Horizontal Canal Right Duration mild dizziness    Horizontal Canal Right Symptoms Ageotrophic   mild dizziness, increased intensity vs L Horizontal     Horizontal Canal Left   Horizontal Canal Left Duration mild dizziness    Horizontal Canal Left Symptoms Ageotrophic                       Vestibular Treatment/Exercise - 04/07/21 0001       Vestibular Treatment/Exercise   Vestibular Treatment Provided Gaze    Gaze Exercises X1 Viewing Horizontal;X1 Viewing Vertical      X1 Viewing Horizontal   Foot Position seated    Comments 2 x 30 seconds; verbal cues required for proper technique                   PT Education - 04/07/21 1526     Education Details clinical findings of vestibular assessment, VOR X 1 addition to HEP (horizontal direction only) - needing verbal and demo cues for technique, instructed to have someone like her daughter or neighbor help with proper technique of exercises.    Person(s) Educated Patient;Child(ren)    Methods Explanation;Demonstration;Handout    Comprehension Verbalized understanding;Returned demonstration;Verbal cues required              PT Short Term Goals - 04/07/21 1645       PT SHORT TERM GOAL #1   Title Pt will undergo further vestibular assessment with LTG to be written as appropriate.    Baseline performed on 04/07/21 with LTG written.    Time 4    Period Weeks    Status Achieved      PT SHORT TERM GOAL #  2   Title Perform BERG with LTG to be written as appropriate.    Time 4    Period Weeks    Status New      PT SHORT TERM GOAL #3   Title Pt  and pt's daughter will verbalize understanding of fall prevention in the home environment.    Time 4    Period Weeks    Status New      PT SHORT TERM GOAL #4   Title Pt will improve gait speed to at least 1.4 ft/sec with SPC vs. appropriate AD in order to demo decr fall risk.    Baseline 1.17 ft/sec    Time 4    Period Weeks    Status New               PT Long Term Goals - 04/07/21 1646       PT LONG TERM GOAL #1   Title Pt will report no dizziness when rolling in bed and resolution of L horizontal canal BPPV to improve functional mobility. ALL LTGS DUE 05/26/21    Baseline mild dizziness when rolling, worse on R side, L horizontal canal cupulolithiasis    Time 8    Period Weeks    Status Revised      PT LONG TERM GOAL #2   Title BERG goal to be written as appropriate in order to demo decr fall risk.    Baseline did not have time to perform during eval    Time 8    Period Weeks    Status New      PT LONG TERM GOAL #3   Title Pt will decr TUG time to 20 seconds or less with SPC in order to demo decr fall risk.    Baseline 24.78 seconds    Time 8    Period Weeks    Status New      PT LONG TERM GOAL #4   Title Pt will improve 5x sit <> stand time to 20 seconds or less without UE support in order to demo improved BLE strength and balance.    Baseline 26.84 seconds    Time 8    Period Weeks    Status New      PT LONG TERM GOAL #5   Title Pt will be independent with final HEP with daughter's supervision in order to build upon functional gains made in therapy    Time [place] - 04/07/21 1651     Clinical Impression Statement Performed vestibular assessment today. Pt with difficulty maintaining smooth pursuits and slower saccades, especially from midline to R. Pt with positive L head impulse test, indicating impaired VOR reflex. With positional testing, pt with mild dizziness and ageotropic nystagmus with worse  when rolling R vs. L, indicating L horizontal canal cupulolithiasis. Pt reporting dizziness from supine <> sit, rolling in bed, and performing more quick turns, indicating likely motion sensitivity as well. Began patient today on seated VOR x1 exercises in horizontal direction only. Will continue to progress towards LTGs.    Personal Factors and Comorbidities Time since onset of injury/illness/exacerbation;Past/Current Experience;Comorbidity 3+    Comorbidities DM2, tachy-brady syndrome, skin cancer, s/p CABG 12/04/11, presence of permanent cardiac pacemaker, paroxysmal atrial flutter,  MI, mild vascular neurocognitive disorder, depression, hypothyroisidm, HTN, hiatal hernia, GERD,  CAD, CKD  stage 3, CHF, arthritis, and anemia.    Examination-Activity Limitations Stairs;Transfers;Locomotion Level;Bend    Examination-Participation Restrictions Community Activity;Cleaning;Driving    Stability/Clinical Decision Making Evolving/Moderate complexity    Rehab Potential Good    PT Frequency 1x / week    PT Duration 8 weeks    PT Treatment/Interventions ADLs/Self Care Home Management;Canalith Repostioning;Functional mobility training;Stair training;Gait training;Therapeutic activities;Therapeutic exercise;DME Instruction;Balance training;Neuromuscular re-education;Patient/family education;Vestibular    PT Next Visit Plan treat L horizontal canal cupulolithiasis. review VOR x1 seated in horizontal direction and add in vertical direction as well.  initial HEP for counter balance and vestib deficits as appropriate.    Consulted and Agree with Plan of Care Patient;Family member/caregiver    Family Member Consulted pt's daughter, [name]             Patient will benefit from skilled therapeutic intervention in order to improve the following deficits and impairments:  Abnormal gait, Decreased activity tolerance, Decreased balance, Decreased coordination, Decreased safety awareness, Dizziness, Decreased strength,  Difficulty walking, Impaired sensation  Visit Diagnosis: Unsteadiness on feet  Dizziness and giddiness  History of falling  Other abnormalities of gait and mobility     Problem List Patient Active Problem List   Diagnosis Date Noted   Acute kidney injury superimposed on chronic kidney disease ([place]) 10/03/2020   Nausea & vomiting 10/03/2020   Abdominal pain 10/03/2020   Mild vascular neurocognitive disorder 09/05/2019   Major depressive disorder with anxious distress 09/05/2019   Coag negative Staphylococcus bacteremia 05/21/2019   Acute blood loss anemia 05/21/2019   Anemia of chronic disease 05/21/2019   Anemia in chronic kidney disease 05/21/2019   SSS (sick sinus syndrome) (HCC)    Paroxysmal atrial fibrillation (HCC)    Acute metabolic encephalopathy    Retroperitoneal hematoma    Muscle hemorrhage    S/P placement of cardiac pacemaker    SIRS (systemic inflammatory response syndrome) ([place]) 05/15/2019   Acute encephalopathy 05/15/2019   Status post coronary artery stent placement    CAD S/P percutaneous coronary angioplasty 09/27/2018   Abnormal nuclear stress test    Pain of left shoulder joint on movement 06/16/2018   Closed fracture of upper end of humerus 06/16/2018   Pain in right knee 04/12/2018   Spinal stenosis of lumbar region 02/28/2018   On anticoagulant therapy 04/22/2017   Tachycardia-bradycardia syndrome ([place]) 12/23/2016   Accelerated hypertension    Chronic diastolic CHF (congestive heart failure) ([place]) 08/09/2016   Elevated troponin 08/09/2016   Hypertensive urgency 08/08/2016   Paroxysmal atrial flutter ([place]) 08/08/2016   Coronary artery disease    Hypertensive heart disease    CKD (chronic kidney disease) stage 4, GFR 15-29 ml/min (HCC)    Other specified hypothyroidism    Acute anemia    Pain in the chest    Shortness of breath 05/23/2016   Pre-syncope 05/23/2016   Chest tightness 09/16/2014   Osteoarthritis of right knee 07/02/2014    Acute medial meniscus tear of right knee 07/02/2014   Diabetic peripheral neuropathy associated with type 1 diabetes mellitus ([place]) 09/05/2013   Edema of extremities 04/18/2013   Cellulitis 04/18/2013   Essential hypertension 01/06/2012   Acute renal failure,admitted 01/03/12, after admission for diastolic chf 32/35/5732   S/P CABG (coronary artery bypass graft), 12/04/11 12/07/2011   Hyperlipidemia LDL goal <70 12/03/2011    Class: Diagnosis of   NSTEMI (non-ST elevated myocardial infarction) ([place]) 12/01/2011   Family history of early CAD 12/01/2011   Insulin dependent diabetes mellitus 12/14/2010  DYSPHAGIA UNSPECIFIED 12/14/2010   ABDOMINAL PAIN, EPIGASTRIC 12/14/2010   ESOPHAGEAL STRICTURE 11/28/2009   GERD 11/28/2009   FLATULENCE-GAS-BLOATING 11/28/2009   CONSTIPATION, CHRONIC 12/19/2007   SALPINGO-OOPHORITIS 12/19/2007   MENOPAUSE, SURGICAL 12/19/2007   BACK PAIN, LUMBAR 12/19/2007   ANKLE INJURY, RIGHT 12/19/2007   TOTAL KNEE REPLACEMENT, LEFT, HX OF 12/19/2007    Arliss Journey, PT, DPT 04/07/2021, 4:52 PM  [facility] [address] [place], [place], [address] Phone: [phone]   Fax:  [phone]  Name: [name] MRN: [id number] Date of Birth: [birth year]

## 2021-04-09 ENCOUNTER — Encounter: Payer: Self-pay | Admitting: Internal Medicine

## 2021-04-09 ENCOUNTER — Other Ambulatory Visit: Payer: Self-pay

## 2021-04-09 ENCOUNTER — Ambulatory Visit: Payer: Medicare Other | Admitting: Internal Medicine

## 2021-04-09 VITALS — BP 162/60 | HR 60 | Ht 60.0 in | Wt 125.4 lb

## 2021-04-09 DIAGNOSIS — I10 Essential (primary) hypertension: Secondary | ICD-10-CM | POA: Diagnosis not present

## 2021-04-09 DIAGNOSIS — I5032 Chronic diastolic (congestive) heart failure: Secondary | ICD-10-CM

## 2021-04-09 DIAGNOSIS — R0602 Shortness of breath: Secondary | ICD-10-CM

## 2021-04-09 DIAGNOSIS — Z950 Presence of cardiac pacemaker: Secondary | ICD-10-CM

## 2021-04-09 NOTE — Patient Instructions (Signed)

## 2021-04-09 NOTE — Progress Notes (Signed)
OFFICE NOTE  Chief Complaint:  Follow-up  Primary Care Physician: Leeroy Cha, MD  HPI:  [name] is a pleasant 80 year old female with history of coronary disease and CABG x 3 vessels in 2013, LIMA to LAD, SVG to OM and SVG to right coronary. She underwent an echocardiogram which demonstrates normal EF of 55% to 65%. However, there is stage 1 diastolic dysfunction and evidence for elevated mean left atrial filling pressure. There were mildly thickened calcified aortic valve leaflets but no stenosis and mild tricuspid regurgitation. At the time, I started her on Lasix 20 mg daily. A BMP was obtained that day which indicated mild elevation at 75.5, creatinine was 1.16. She has been taking the Lasix with improvement in her lower extremity swelling although shortness of breath seems to be about the same. Again, she notes it is only with marked exertion, not necessarily with normal activities. However, she does have ongoing fatigue and easy fatigability. This may be due to just poor exercise tolerance. She also reported that recently you started her on a new SLGT2 diabetic medicine and she said that she took several doses of that, which I believe was Invokana, and she became markedly dizzy with that. She has since stopped that medication. Again, overall the patient is fairly stable with a normal ejection fraction. She recently had bypass and is not having any anginal symptoms and therefore I do not suspect that this is an issue related to her bypass grafts.   I saw [name] today in the office for an acute visit regarding what is described as abdominal pain. She reports a numbness and pain sensation over the right upper quadrant. She's also noted some firmness and distention of her abdomen. This is recently presented to a GI doctor in [place] who thought she might have a gastric ulcer. He performed endoscopy which was a reportedly unrevealing. He also recommended she is take MiraLAX  for constipation. She says that recently she's been constipated but also has been having some small amount of loose stool. She did not want to take the MiraLAX given the loose stool. She also reports some of the discomfort extends across the mid epigastrium and into the lower chest. It is entirely atypical for cardiac chest pain. She reports that she had a colonoscopy in the past by Dr. Henrene Pastor about 5 years ago and a repeat colonoscopy was recommended around 2018. She also tells me that she recently had a right upper quadrant ultrasound performed at [facility] after presenting with abdominal pain which was negative for acute biliary disease.  [name] returns today for follow-up. Overall she seems to be doing well from a cardiac standpoint. I understand she is switched primary care providers to [facility] primary care, however she is unclear of her primary care provider. She does have an upcoming appointment to see Jackelyn Knife neurology. Recently she's been having trouble with forgetfulness and it seems to be getting worse. She tells me she lost her cell phone at home and has not been able to locate it. She denies any chest pain or shortness of breath and in fact her flank back and abdominal pain which was evaluated extensively has now gone away.  I had the pleasure of seeing [name] back today in follow-up. It's been 6 months and she'll last appointment. Her main concern today is leg weakness. She reports she has some back pain and associated weakness in her legs that give out from time to  time. She recently had memory testing indicating possible dementia as well as likely depression. It was recommended that she go on therapy however she declined medications. She denies any chest pain or worsening shortness of breath.  07/01/2016  [name] returns today for follow-up. She was recently hospitalized for acute diastolic heart failure. She was treated with diuretics and adjustments are  made to her blood pressure medications. She had a repeat echocardiogram which was essentially stable showed normal LV EF and diastolic dysfunction. After discharge she underwent nuclear stress testing which was negative for ischemia. She saw Ignacia Bayley, NP, who made some changes to her blood pressure medications including placing her on clonidine. Since taking this medicine she has had some of the typical symptoms of fatigue and dry mouth as well as constipation. She was alert initially taking clonidine 2 mg twice daily. She then decreased it to one half tablet or 1 mg twice daily but blood pressure remains high. I checked her blood pressure manually today 160/60. She reports fatigue and low energy.  09/22/2016  [name] returns today for hospital follow-up. 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 She had a St. Jude surety MRI safe pacemaker placed. She was also started on amiodarone and underwent cardioversion. She seems to be maintaining sinus rhythm. She reports improvement in her energy. She recently saw Ignacia Bayley, NP, in follow-up who reported she was doing well although noted her blood pressure was more elevated. She was placed on doxazosin 4 mg daily and had initial improvement in blood pressure however blood pressure has since gone up. Today was 186/62. Unfortunately, she's been intolerant of a number of blood pressure medications in the past.  12/23/2016  [name] was seen in follow-up today. She saw Dr. candidates in January who noted that she reported being shaky on amiodarone and he decreased the dose down to 100 mg daily. She remains in an AV paced rhythm but there has been recurrent underlying atrial flutter. She is also had some fatigue. The pressure is elevated today 156/70. I reviewed home monitoring indicates blood pressures continue to be elevated. She seems to be okay with these numbers but she is at increased  risk for events. Unfortunate she's had intolerance to number of medications.  04/22/2017  [name] returns today for follow-up. She recently saw Almyra Deforest, PA-C, who noted that she had had some weight gain and lower extremity edema. He discontinued her chlorthalidone and put her on low-dose Lasix. She's had improvement in her swallowing with this and some weight loss. She then followed up with Dr. Moshe Cipro at [facility]. Her creatinine was noted to be 2.7 and she is undergoing a renal ultrasound and further workup for this. She was also noted to be anemic with a low iron saturation. She's been receiving some IV iron. She seems to be maintaining sinus rhythm on low-dose amiodarone. Recently she saw her endocrinologist who had increased her Synthroid. Her last TSH was between 7 and 8. She is due for repeat check of that. She has done well with the placement of a pacemaker in the fall. She has remote checks to follow that.  08/25/2017  [name] was seen today in follow-up.  Recently she had about 12 pound weight gain and saw her nephrologist.  She was started back on Lasix and was noted to have an elevated creatinine now back up to 2.14.  In addition her blood pressure was running low, specifically her  diastolic blood pressure and her amlodipine was decreased to 5 mg daily.  She seems to be maintaining sinus rhythm and is on low-dose amiodarone.  She is in a paced rhythm today and recent remote check showed no real evidence of atrial fibrillation.  She was on Xarelto however stopped that on her own recently because of fatigue and weakness.  She was also noted to be anemic and has been started on iron.  This begs a question of whether she may be having microscopic bleeding or whether her anemia was related to chronic kidney disease.  Nevertheless, she has an appropriately elevated chads vascular score and should be anticoagulated on Xarelto.  10/26/2017  [name] returns today for follow-up.   She again is gained about 5 or 6 pounds.  Apparently she had seen her nephrologist and was started back on Lasix however her creatinine is rising.  I repeated labs on October 10, 2017 which showed a creatinine had increased up to 2.24, previously 1.9 and 2.45.  Her pro-BNP was elevated at 1437.  Since that time she has had some worsening shortness of breath, lower extremity swelling and increasing abdominal girth.  Blood pressure is quite elevated today 190/70, however she says is been lower at home.  06/12/2018  [name] is again seen today in follow-up.  She has had some increased abdominal girth but no significant swelling or edema.  Remote pacemaker checks have been performed which showed normal battery life and adequate pacer function.  She is noted today to be in an atrial paced rhythm with prolonged AV conduction at 60.  She denies any chest pain or worsening shortness of breath. She was recently seen by Dr. Moshe Cipro at [facility].  It is felt that her creatinine has been somewhat stable.  11/27/2018  [name] seen today for follow-up of left heart catheterization.  This occurred in December after having symptoms of progressive shortness of breath and chest tightness.  She did have a stress test in November 2019 which showed a new area of ischemia in the OM territory.  She underwent left heart catheterization which showed high-grade graft disease with 95% ostial stenosis in the graft to the marginal vessel which likely accounted for the nuclear study changes.  However, given her chronic kidney disease stage PCI was recommended.  She went back for PCI in December 2019 which was successful.  Since then she is done fairly well.  Immediately had some improvement in shortness of breath and discomfort however has had some recurrent shortness of breath.  She has had some improvement in dyslipidemia now after restarting rosuvastatin 5 mg daily however LDL remains elevated at 114.  She previously  could not tolerate higher doses of statins however I am not sure as she has been on high potency rosuvastatin.  She also remains on Plavix and Xarelto which I think may be a lifelong combination for her.  Also, her blood pressure has been running little higher after stopping her amlodipine recently because of lower blood pressure.  08/15/2019  [name] is seen today in follow-up with her daughter.  Overall she continues to do well without recurrent chest pain after PCI last year.  She is on Plavix and Xarelto and denies any significant bleeding issues on that.  She also takes daily torsemide.  EKG shows an atrial paced rhythm today.  He recently has had some hearing difficulties but that is being addressed.  Blood pressure is at times labile however has been reasonably  well controlled.  Although the systolics a little higher she does have a wide pulse pressure and I would be hesitant to be real aggressive about her treatment as to not lower her diastolics any further.  Today her diastolic pressure was 48.  01/04/2020  [name] returns today for follow-up.  She is again accompanied by her daughter.  After some discussion she reports she has had some progressive dyspnea on exertion.  This is probably been over the past several months however to her daughter is much more noticeable.  She gets some pressure in her chest but no pain.  This is associated with exertion and relieved at rest.  02/11/2020  [name] returns today for follow-up of her Myoview stress test.  She is again accompanied by her daughter.  The Myoview stress test indicated a moderate sized partially reversible perfusion defect of the apical inferior, apical lateral and apex areas.  It was thought that this might be attenuation artifact.  LVEF was 47%.  In comparison with the prior study, this appears to be fairly stable if not slightly improved.  To my eye, there is less ischemia.  She now reports that her symptoms have improved  somewhat as well.  04/09/2021  [name] returns today for follow-up.  She denies any worsening cardiac sounding chest pain.  Blood pressure was elevated today 162/60.  She has been undergoing some vestibular physical therapy.  Seen by nephrology and had an episode where she was admitted with some degree of dehydration.  Her creatinine baseline now is between 2 and 2.5.  She does report some persistent shortness of breath particularly with exertion however says that she is mostly inactive.  Lipid profile in May showed total cholesterol 171, HDL 40, triglycerides 235 and LDL of 92.  EKG shows a paced rhythm with prolonged AV conduction and lateral ST and T wave changes at 60.  Remote pacer checks have been unrevealing.  PMHx:  Past Medical History:  Diagnosis Date   Anemia    Anxiety    Arthritis    "in my hands; knees, back" (09/27/2018)   CAD (coronary artery disease)    a. 40-59% bilaterally 10/2015.   Chronic diastolic CHF (congestive heart failure) ([place])    a. 05/2016 Echo: EF 60-65%, no rwma, Gr1 DD, Ao sclerosis w/o stenosis, triv MR;  b. 07/2016 TEE: EF 55-60%, no rwma, mild MR.   Chronic headaches    CKD (chronic kidney disease), stage III (HCC)    Coronary artery disease    a. 02/2007 Persantine MV: low risk;  b. 11/2011 CABG x 3 (LIMA->LAD, VG->OM, VG->RCA);  c. 05/2016 MV: EF >65%, no isch/infarct, horiz ST dep in I, II, V5-V6.   Depression    Diverticulosis    Esophageal stricture    GERD (gastroesophageal reflux disease)    Hemorrhoids    Hiatal hernia    Hyperkalemia    a. ARB stopped due to this.   Hyperlipidemia    Hypertension    Hypertensive heart disease    Hypothyroidism    Major depressive disorder with anxious distress 09/05/2019   Mild cognitive impairment    a. seen by neurology.   Mild vascular neurocognitive disorder ([place]) 09/05/2019   Myocardial infarction ([place]) 12/07/2011   PAF (paroxysmal atrial fibrillation) ([place])    a. post-op CABG.   Pain    RIGHT  KNEE PAIN - TORN RIGHT MEDIAL MENISCUS   Paroxysmal atrial flutter ([place])    a. 07/2016 s/p TEE &  DCCV;  b. 07/2016 Recurrent PAFlutter req initiation of amio & PPM in setting of tachy-brady;  c. CHA2DS2VASc = 7-->Xarelto 15 mg QD.   Pneumonia    "twice" (09/27/2018)   PONV (postoperative nausea and vomiting)    Presence of permanent cardiac pacemaker 08/12/2016   S/P CABG (coronary artery bypass graft), 12/04/11 12/07/2011   LIMA to LAD, SVG to OM, SVG to RCA   Sinus bradycardia    a. not on BB due to this.   Skin cancer    "face" (09/27/2018)   Tachy-brady syndrome ([place])    a. 07/2016 Jxnl brady following DCCV, recurrent Aflutter-->amio + SJM 2272 Assurity MRI DC PPM (ser # [id number]).   Type II diabetes mellitus ([place])     Past Surgical History:  Procedure Laterality Date   ABDOMINAL HYSTERECTOMY  1980's   ANKLE FRACTURE SURGERY Right    "put pins both side right ankle"   BACK SURGERY  2006   "cyst growing near my spine"   CARDIAC CATHETERIZATION  12/02/2011   mild LV dysfunction with mod hypocontractility of mid-distal anterolateral wall; CAD w/ostial tapering of L [place] with 50% diffuse ostial narrowing of LAD, 99% eccentric focal prox LAD stenosis followed by 70% prox LAD stenosis after 1st diag, 20% mid LAD narrowing; 80% ostial-to-prox L Cfx stenosis & 40-50% irregularity of RCA (Dr. Corky Downs)   CARDIOVERSION N/A 08/11/2016   Procedure: CARDIOVERSION;  Surgeon: Lelon Perla, MD;  Location: [place] [facility];  Service: Cardiovascular;  Laterality: N/A;   CATARACT EXTRACTION W/ INTRAOCULAR LENS  IMPLANT, BILATERAL Bilateral ~ 2010   [place] GRAFT  12/04/2011   Procedure: CORONARY ARTERY BYPASS GRAFTING (CABG);  Surgeon: Tharon Aquas Adelene Idler, MD;  Location: [place];  Service: Open Heart Surgery;  Laterality: N/A;  CABG x three,  using left internal mammary artery, and right leg greater saphenous vein harvested endoscopically   CORONARY STENT INTERVENTION  N/A 09/27/2018   Procedure: CORONARY STENT INTERVENTION;  Surgeon: Troy Sine, MD;  Location: [place] CV LAB;  Service: Cardiovascular;  Laterality: N/A;   DILATION AND CURETTAGE OF UTERUS     "a couple times"   EP IMPLANTABLE DEVICE N/A 08/12/2016   Procedure: Pacemaker Implant;  Surgeon: Will Meredith Leeds, MD;  Location: [place] CV LAB;  Service: Cardiovascular;  Laterality: N/A;   ESOPHAGOGASTRODUODENOSCOPY (EGD) WITH ESOPHAGEAL DILATION     FRACTURE SURGERY     JOINT REPLACEMENT     KNEE ARTHROSCOPY WITH MEDIAL MENISECTOMY Right 07/02/2014   Procedure: RIGHT KNEE ARTHROSCOPY WITH PARTIAL MEDIAL MENISTECTOMY, ABRASION CONDROPLASTYU OF PATELLA,ABRASION CONDROPLASTY OF MEDIAL FEMEROL CONDYL, MICROFRACTURE OF MEDIAL FEMEROL CONDYL;  Surgeon: Tobi Bastos, MD;  Location: [facility];  Service: Orthopedics;  Laterality: Right;   LEFT HEART CATH AND CORS/GRAFTS ANGIOGRAPHY N/A 09/06/2018   Procedure: LEFT HEART CATH AND CORS/GRAFTS ANGIOGRAPHY;  Surgeon: Troy Sine, MD;  Location: [place] [facility];  Service: Cardiovascular;  Laterality: N/A;   LEFT HEART CATHETERIZATION WITH CORONARY ANGIOGRAM N/A 12/02/2011   Procedure: LEFT HEART CATHETERIZATION WITH CORONARY ANGIOGRAM;  Surgeon: Troy Sine, MD;  Location: [facility] CATH LAB;  Service: Cardiovascular;  Laterality: N/A;  Coronary angiogram, possible PCI   TEE WITHOUT CARDIOVERSION N/A 08/11/2016   Procedure: TRANSESOPHAGEAL ECHOCARDIOGRAM (TEE);  Surgeon: Lelon Perla, MD;  Location: [facility]COPY;  Service: Cardiovascular;  Laterality: N/A;   TEE WITHOUT CARDIOVERSION N/A 05/18/2019   Procedure: TRANSESOPHAGEAL ECHOCARDIOGRAM (TEE);  Surgeon: Lyman Bishop  C, MD;  Location: [place];  Service: Cardiovascular;  Laterality: N/A;   [place] Left ~ 2006   TRANSTHORACIC ECHOCARDIOGRAM  02/19/2013   EF 82-50%, grade 1 diastolic dysfunction; mildly thickend/calcified AV leaflets; mildly calcidied  MV annulus; mild TR    FAMHx:  Family History  Problem Relation Age of Onset   Diabetes Mother    CVA Mother    Hypertension Mother    Heart disease Father    Hyperlipidemia Father    Breast cancer Sister    Breast cancer Sister        x 3   Heart disease Brother        x5; one with MI   Heart disease Sister        x3   Diabetes Sister        x3   Lung cancer Sister    Breast cancer Sister    Colon cancer Neg Hx     SOCHx:   reports that she has never smoked. She has never used smokeless tobacco. She reports previous alcohol use. She reports that she does not use drugs.  ALLERGIES:  Allergies  Allergen Reactions   Clonidine Derivatives Other (See Comments)    Bradycardia and fatigue    Mango Flavor Hives   Sulfa Antibiotics Other (See Comments)    Unknown   Sulfa Antibiotics    Crestor [Rosuvastatin Calcium] Other (See Comments)    Other reaction(s): tired and weak   Epinephrine Other (See Comments)    Abnormal feeling. Dental exam/injection of local w/ epi.   Hydralazine Other (See Comments)    Nausea/gi upset    Losartan Other (See Comments)    Hyperkalemia    Other Other (See Comments)    MANGO'S - WELTS ALL OVER    ROS: Pertinent items noted in HPI and remainder of comprehensive ROS otherwise negative.  HOME MEDS: Current Outpatient Medications  Medication Sig Dispense Refill   acetaminophen (TYLENOL) 650 MG CR tablet Take 650 mg by mouth every 8 (eight) hours as needed for pain.     acetaminophen (TYLENOL) 650 MG CR tablet Take 650 mg by mouth as needed.     allopurinol (ZYLOPRIM) 100 MG tablet Take 100 mg by mouth daily.     aspirin EC 81 MG tablet Take 81 mg by mouth daily.     carvedilol (COREG) 25 MG tablet Take 12.5 mg by mouth 2 (two) times daily.     diclofenac Sodium (VOLTAREN) 1 % GEL Apply 2 g topically 2 (two) times daily as needed (pain).     docusate sodium (COLACE) 100 MG capsule Take 100 mg by mouth 2 (two) times daily as needed for  mild constipation or moderate constipation.     gabapentin (NEURONTIN) 300 MG capsule Take 1 capsule (300 mg total) by mouth at bedtime. 30 capsule 2   insulin glargine (LANTUS) 100 UNIT/ML injection Inject 26 Units into the skin daily.     meclizine (ANTIVERT) 25 MG tablet Take 25 mg by mouth daily as needed for dizziness. For dizziness     Multiple Vitamins-Minerals (PRESERVISION AREDS 2 PO) Take 1 tablet by mouth every evening.      ondansetron (ZOFRAN-ODT) 4 MG disintegrating tablet Take 4 mg by mouth daily as needed.     pantoprazole (PROTONIX) 40 MG tablet Take 40 mg by mouth daily.     Polyethyl Glycol-Propyl Glycol (SYSTANE OP) Place 1 drop into both eyes 2 (two)  times daily as needed (dry eyes).     rosuvastatin (CRESTOR) 10 MG tablet Take 10 mg by mouth at bedtime.     sennosides-docusate sodium (SENOKOT-S) 8.6-50 MG tablet daily.     SYNTHROID 100 MCG tablet Take 100 mcg by mouth every morning.     torsemide (DEMADEX) 20 MG tablet Take 20 mg by mouth See admin instructions. Patient alternates between 20 mg and 40 mg every other day     ferrous sulfate 325 (65 FE) MG EC tablet Take 325 mg by mouth daily with breakfast. (Patient not taking: Reported on 04/09/2021)     nitroGLYCERIN (NITROSTAT) 0.4 MG SL tablet Place 1 tablet (0.4 mg total) under the tongue every 5 (five) minutes as needed for chest pain (max 3 doses). (Patient not taking: Reported on 12/12/2020) 25 tablet 3   No current facility-administered medications for this visit.    LABS/IMAGING: No results found for this or any previous visit (from the past 48 hour(s)). No results found.  VITALS: BP (!) 162/60 (BP Location: Left Arm, Patient Position: Sitting, Cuff Size: Normal)   Pulse 60   Ht 5' (1.524 m)   Wt 125 lb 6.4 oz (56.9 kg)   SpO2 99%   BMI 24.49 kg/m   EXAM: Deferred  EKG: Atrial paced rhythm with prolonged AV conduction at 60, lateral ST and T wave changes-personally reviewed  ASSESSMENT: Progressive  DOE-low risk Myoview stress test (12/2019)-LVEF 47% Unstable angina with recent PCI to the SVG to marginal graft (2019) Coronary artery disease status post CABG x 3 (LIMA to LAD, SVG to Diag, SVG to RCA) in 2013 Paroxysmal atrial flutter-CHADSVASC score of 5 on Xarelto Tachybradycardia syndrome status post St. Jude permanent pacemaker (07/2016) Diabetes type 2 - now on insulin Hypertension Dyslipidemia Peripheral neuropathy - secondary to diabetes Progressive memory loss Low back pain and leg weakness  CKD 3  PLAN: 1.   Izabellah denies cardiac sounding chest pain but does have persistent dyspnea with some activities.  She however generally is fairly sedentary otherwise.  Due to chronic kidney disease and a creatinine between 2 and 3, she is not an ideal candidate for cardiac catheterization.  She is on a long-acting nitrate.  If possible she could use a short acting nitrate if needed for breakthrough chest pain or persistent symptoms that continued despite resting.  Plan follow-up in 6 months or sooner as necessary.  [name], MD, [facility], [place] Director of the Advanced Lipid Disorders &  Cardiovascular Risk Reduction Clinic Attending Cardiologist  Direct Dial: [phone]  Fax: [phone]  Website:  [email or web address] 04/09/2021, 2:36 PM

## 2021-04-21 ENCOUNTER — Ambulatory Visit: Payer: Medicare Other | Admitting: Physical Therapy

## 2021-04-21 ENCOUNTER — Other Ambulatory Visit: Payer: Self-pay

## 2021-04-21 ENCOUNTER — Encounter: Payer: Self-pay | Admitting: Physical Therapy

## 2021-04-21 DIAGNOSIS — R2681 Unsteadiness on feet: Secondary | ICD-10-CM | POA: Diagnosis not present

## 2021-04-21 DIAGNOSIS — Z9181 History of falling: Secondary | ICD-10-CM | POA: Diagnosis not present

## 2021-04-21 DIAGNOSIS — M6281 Muscle weakness (generalized): Secondary | ICD-10-CM | POA: Diagnosis not present

## 2021-04-21 DIAGNOSIS — R42 Dizziness and giddiness: Secondary | ICD-10-CM

## 2021-04-21 DIAGNOSIS — R2689 Other abnormalities of gait and mobility: Secondary | ICD-10-CM | POA: Diagnosis not present

## 2021-04-21 NOTE — Therapy (Signed)
[facility] [address] [place], [place], [address] Phone: [phone]   Fax:  [phone]  Physical Therapy Treatment  Patient Details  Name: [name] MRN: [id number] Date of Birth: 1941/02/23 Referring Provider (PT): Delrae Rend, MD   Encounter Date: 04/21/2021   PT End of Session - 04/21/21 1430     Visit Number 3    Number of Visits 9    Date for PT Re-Evaluation 05/30/21    Authorization Type [employer] Medicare    PT Start Time 1320    PT Stop Time 1400    PT Time Calculation (min) 40 min    Equipment Utilized During Treatment Gait belt    Activity Tolerance Patient tolerated treatment well    Behavior During Therapy [facility] for tasks assessed/performed             Past Medical History:  Diagnosis Date   Anemia    Anxiety    Arthritis    "in my hands; knees, back" (09/27/2018)   CAD (coronary artery disease)    a. 40-59% bilaterally 10/2015.   Chronic diastolic CHF (congestive heart failure) ([place])    a. 05/2016 Echo: EF 60-65%, no rwma, Gr1 DD, Ao sclerosis w/o stenosis, triv MR;  b. 07/2016 TEE: EF 55-60%, no rwma, mild MR.   Chronic headaches    CKD (chronic kidney disease), stage III (HCC)    Coronary artery disease    a. 02/2007 Persantine MV: low risk;  b. 11/2011 CABG x 3 (LIMA->LAD, VG->OM, VG->RCA);  c. 05/2016 MV: EF >65%, no isch/infarct, horiz ST dep in I, II, V5-V6.   Depression    Diverticulosis    Esophageal stricture    GERD (gastroesophageal reflux disease)    Hemorrhoids    Hiatal hernia    Hyperkalemia    a. ARB stopped due to this.   Hyperlipidemia    Hypertension    Hypertensive heart disease    Hypothyroidism    Major depressive disorder with anxious distress 09/05/2019   Mild cognitive impairment    a. seen by neurology.   Mild vascular neurocognitive disorder ([place]) 09/05/2019   Myocardial infarction ([place]) 12/07/2011   PAF (paroxysmal atrial fibrillation) ([place])    a.  post-op CABG.   Pain    RIGHT KNEE PAIN - TORN RIGHT MEDIAL MENISCUS   Paroxysmal atrial flutter ([place])    a. 07/2016 s/p TEE & DCCV;  b. 07/2016 Recurrent PAFlutter req initiation of amio & PPM in setting of tachy-brady;  c. CHA2DS2VASc = 7-->Xarelto 15 mg QD.   Pneumonia    "twice" (09/27/2018)   PONV (postoperative nausea and vomiting)    Presence of permanent cardiac pacemaker 08/12/2016   S/P CABG (coronary artery bypass graft), 12/04/11 12/07/2011   LIMA to LAD, SVG to OM, SVG to RCA   Sinus bradycardia    a. not on BB due to this.   Skin cancer    "face" (09/27/2018)   Tachy-brady syndrome ([place])    a. 07/2016 Jxnl brady following DCCV, recurrent Aflutter-->amio + SJM 2272 [employer] MRI DC PPM (ser # [id number]).   Type II diabetes mellitus ([place])     Past Surgical History:  Procedure Laterality Date   ABDOMINAL HYSTERECTOMY  1980's   ANKLE FRACTURE SURGERY Right    "put pins both side right ankle"   BACK SURGERY  2006   "cyst growing near my spine"   CARDIAC CATHETERIZATION  12/02/2011   mild LV dysfunction with mod hypocontractility  of mid-distal anterolateral wall; CAD w/ostial tapering of L Main with 50% diffuse ostial narrowing of LAD, 99% eccentric focal prox LAD stenosis followed by 70% prox LAD stenosis after 1st diag, 20% mid LAD narrowing; 80% ostial-to-prox L Cfx stenosis & 40-50% irregularity of RCA (Dr. Corky Downs)   CARDIOVERSION N/A 08/11/2016   Procedure: CARDIOVERSION;  Surgeon: Lelon Perla, MD;  Location: [facility] ENDOSCOPY;  Service: Cardiovascular;  Laterality: N/A;   CATARACT EXTRACTION W/ INTRAOCULAR LENS  IMPLANT, BILATERAL Bilateral ~ 2010   [place] GRAFT  12/04/2011   Procedure: CORONARY ARTERY BYPASS GRAFTING (CABG);  Surgeon: Tharon Aquas Adelene Idler, MD;  Location: [facility];  Service: Open Heart Surgery;  Laterality: N/A;  CABG x three,  using left internal mammary artery, and right leg greater saphenous vein harvested endoscopically    CORONARY STENT INTERVENTION N/A 09/27/2018   Procedure: CORONARY STENT INTERVENTION;  Surgeon: Troy Sine, MD;  Location: [facility] CV LAB;  Service: Cardiovascular;  Laterality: N/A;   DILATION AND CURETTAGE OF UTERUS     "a couple times"   EP IMPLANTABLE DEVICE N/A 08/12/2016   Procedure: Pacemaker Implant;  Surgeon: Will Meredith Leeds, MD;  Location: [facility] CV LAB;  Service: Cardiovascular;  Laterality: N/A;   ESOPHAGOGASTRODUODENOSCOPY (EGD) WITH ESOPHAGEAL DILATION     FRACTURE SURGERY     JOINT REPLACEMENT     KNEE ARTHROSCOPY WITH MEDIAL MENISECTOMY Right 07/02/2014   Procedure: RIGHT KNEE ARTHROSCOPY WITH PARTIAL MEDIAL MENISTECTOMY, ABRASION CONDROPLASTYU OF PATELLA,ABRASION CONDROPLASTY OF MEDIAL FEMEROL CONDYL, MICROFRACTURE OF MEDIAL FEMEROL CONDYL;  Surgeon: Tobi Bastos, MD;  Location: [facility] ORS;  Service: Orthopedics;  Laterality: Right;   LEFT HEART CATH AND CORS/GRAFTS ANGIOGRAPHY N/A 09/06/2018   Procedure: LEFT HEART CATH AND CORS/GRAFTS ANGIOGRAPHY;  Surgeon: Troy Sine, MD;  Location: [facility] CV LAB;  Service: Cardiovascular;  Laterality: N/A;   LEFT HEART CATHETERIZATION WITH CORONARY ANGIOGRAM N/A 12/02/2011   Procedure: LEFT HEART CATHETERIZATION WITH CORONARY ANGIOGRAM;  Surgeon: Troy Sine, MD;  Location: [facility] C[facility] LAB;  Service: Cardiovascular;  Laterality: N/A;  Coronary angiogram, possible PCI   TEE WITHOUT CARDIOVERSION N/A 08/11/2016   Procedure: TRANSESOPHAGEAL ECHOCARDIOGRAM (TEE);  Surgeon: Lelon Perla, MD;  Location: [facility] ENDOSCOPY;  Service: Cardiovascular;  Laterality: N/A;   TEE WITHOUT CARDIOVERSION N/A 05/18/2019   Procedure: TRANSESOPHAGEAL ECHOCARDIOGRAM (TEE);  Surgeon: [name], MD;  Location: [facility] ENDOSCOPY;  Service: Cardiovascular;  Laterality: N/A;   [place] Left ~ 2006   TRANSTHORACIC ECHOCARDIOGRAM  02/19/2013   EF 35-32%, grade 1 diastolic dysfunction; mildly thickend/calcified  AV leaflets; mildly calcidied MV annulus; mild TR    There were no vitals filed for this visit.   Subjective Assessment - 04/21/21 1324     Subjective No changes, no falls. Feels like the dizziness is better.    Patient is accompained by: Family member   pt's daughter [name]   Pertinent History DM2, tachy-brady syndrome, skin cancer, s/p CABG 12/04/11, presence of permanent cardiac pacemaker, paroxysmal atrial flutter,  MI, mild vascular neurocognitive disorder, depression, hypothyroisidm, HTN, hiatal hernia, GERD,  CAD, CKD stage 3, CHF, arthritis, and anemia.    Limitations Walking    Patient Stated Goals wants to learn to walk better.    Currently in Pain? No/denies                     Vestibular Assessment -  04/21/21 0001       Positional Testing   Dix-Hallpike Dix-Hallpike Right;Dix-Hallpike Left    Horizontal Canal Testing Horizontal Canal Right;Horizontal Canal Left      Dix-Hallpike Right   Dix-Hallpike Right Symptoms No nystagmus      Dix-Hallpike Left   Dix-Hallpike Left Symptoms No nystagmus      Horizontal Canal Right   Horizontal Canal Right Symptoms Normal      Horizontal Canal Left   Horizontal Canal Left Symptoms Normal                       Vestibular Treatment/Exercise - 04/21/21 1342       Vestibular Treatment/Exercise   Vestibular Treatment Provided Gaze    Gaze Exercises X1 Viewing Horizontal;X1 Viewing Vertical      X1 Viewing Horizontal   Foot Position seated    Comments 3 x 30 seconds, cues for proper technqiue and to keep eyes on target      X1 Viewing Vertical   Foot Position seated    Comments 3 x 30 seconds - cues for technique, added to pt's HEP             Access Code: [email or web address] URL: [email or web address] Date: 04/21/2021 Prepared by: Janann August  Added to HEP for standing corner balance for pt to perform with family supervision. Also added VOR x1 for vertical head  motions.  Exercises Standing Balance with Eyes Closed - 1 x daily - 5 x weekly - 3 sets - 30 hold - with feet apart  Romberg Stance with Head Nods - 1 x daily - 5 x weekly - 2 sets - 10 reps - with feet together on level ground, x10 reps head turns, x10 reps head nods.    Balance Exercises - 04/21/21 1417       Balance Exercises: Standing   Standing Eyes Opened Limitations    Standing Eyes Opened Limitations feet apart - x10 reps head turns, x10 reps head nods, then performed with feet together    Other Standing Exercises standing feet together eyes open: 30 seconds, with eyes closed: only able to hold for 5 seconds               PT Education - 04/21/21 1429     Education Details clinical findings, additions to HEP    Person(s) Educated Child(ren);Patient    Methods Explanation;Demonstration;Verbal cues;Handout    Comprehension Verbalized understanding;Returned demonstration;Need further instruction              PT Short Term Goals - 04/21/21 1425       PT SHORT TERM GOAL #1   Title Pt will undergo further vestibular assessment with LTG to be written as appropriate. ALL STGS DUE 04/28/21    Baseline performed on 04/07/21 with LTG written.    Time 4    Period Weeks    Status Achieved      PT SHORT TERM GOAL #2   Title Perform BERG with LTG to be written as appropriate.    Time 4    Period Weeks    Status New      PT SHORT TERM GOAL #3   Title Pt and pt's daughter will verbalize understanding of fall prevention in the home environment.    Time 4    Period Weeks    Status New      PT SHORT TERM GOAL #4   Title Pt will improve gait speed to at least 1.4  ft/sec with SPC vs. appropriate AD in order to demo decr fall risk.    Baseline 1.17 ft/sec    Time 4    Period Weeks    Status New               PT Long Term Goals - 04/07/21 1646       PT LONG TERM GOAL #1   Title Pt will report no dizziness when rolling in bed and resolution of L horizontal canal  BPPV to improve functional mobility. ALL LTGS DUE 05/26/21    Baseline mild dizziness when rolling, worse on R side, L horizontal canal cupulolithasis    Time 8    Period Weeks    Status Revised      PT LONG TERM GOAL #2   Title BERG goal to be written as appropriate in order to demo decr fall risk.    Baseline did not have time to perform during eval    Time 8    Period Weeks    Status New      PT LONG TERM GOAL #3   Title Pt will decr TUG time to 20 seconds or less with SPC in order to demo decr fall risk.    Baseline 24.78 seconds    Time 8    Period Weeks    Status New      PT LONG TERM GOAL #4   Title Pt will improve 5x sit <> stand time to 20 seconds or less without UE support in order to demo improved BLE strength and balance.    Baseline 26.84 seconds    Time 8    Period Weeks    Status New      PT LONG TERM GOAL #5   Title Pt will be independent with final HEP with daughter's supervision in order to build upon functional gains made in therapy    Time 8    Period Weeks    Status New                   Plan - 04/21/21 1426     Clinical Impression Statement Pt reporting an improvement in her dizziness, when re-assessing positional testing this visit pt with no nystagmus or sx of dizziness, indicating that L horizontal canal cupulolithiasis has resolved. Remainder of session focused on VOR x1 exercises for gaze stabilization and standing balance with EC and head motions for vestibular input with appropriate exercises added to HEP. Pt unable to stand with feet together and eyes closed for >5 seconds.    Personal Factors and Comorbidities Time since onset of injury/illness/exacerbation;Past/Current Experience;Comorbidity 3+    Comorbidities DM2, tachy-brady syndrome, skin cancer, s/p CABG 12/04/11, presence of permanent cardiac pacemaker, paroxysmal atrial flutter,  MI, mild vascular neurocognitive disorder, depression, hypothyroisidm, HTN, hiatal hernia, GERD,  CAD, CKD  stage 3, CHF, arthritis, and anemia.    Examination-Activity Limitations Stairs;Transfers;Locomotion Level;Bend    Examination-Participation Restrictions Community Activity;Cleaning;Driving    Stability/Clinical Decision Making Evolving/Moderate complexity    Rehab Potential Good    PT Frequency 1x / week    PT Duration 8 weeks    PT Treatment/Interventions ADLs/Self Care Home Management;Canalith Repostioning;Functional mobility training;Stair training;Gait training;Therapeutic activities;Therapeutic exercise;DME Instruction;Balance training;Neuromuscular re-education;Patient/family education;Vestibular    PT Next Visit Plan work on BLE strengthening and add to HEP as appropriate (pt reporting today that she wants to work more on her strength). check STGs (due to copay and only coming 1x a week i wouldnt do  BERG). progress VOR x1 as appropriate. corner balance with eyes closed and head motions.    PT Home Exercise Plan [email or web address] and seated VOR x1 (horizontal/vertical)    Consulted and Agree with Plan of Care Patient;Family member/caregiver    Family Member Consulted pt's daughter, [name]             Patient will benefit from skilled therapeutic intervention in order to improve the following deficits and impairments:  Abnormal gait, Decreased activity tolerance, Decreased balance, Decreased coordination, Decreased safety awareness, Dizziness, Decreased strength, Difficulty walking, Impaired sensation  Visit Diagnosis: Unsteadiness on feet  Dizziness and giddiness  Other abnormalities of gait and mobility     Problem List Patient Active Problem List   Diagnosis Date Noted   Acute kidney injury superimposed on chronic kidney disease ([place]) 10/03/2020   Nausea & vomiting 10/03/2020   Abdominal pain 10/03/2020   Mild vascular neurocognitive disorder 09/05/2019   Major depressive disorder with anxious distress 09/05/2019   Coag negative Staphylococcus bacteremia 05/21/2019   Acute  blood loss anemia 05/21/2019   Anemia of chronic disease 05/21/2019   Anemia in chronic kidney disease 05/21/2019   SSS (sick sinus syndrome) (HCC)    Paroxysmal atrial fibrillation (HCC)    Acute metabolic encephalopathy    Retroperitoneal hematoma    Muscle hemorrhage    S/P placement of cardiac pacemaker    SIRS (systemic inflammatory response syndrome) ([place]) 05/15/2019   Acute encephalopathy 05/15/2019   Status post coronary artery stent placement    CAD S/P percutaneous coronary angioplasty 09/27/2018   Abnormal nuclear stress test    Pain of left shoulder joint on movement 06/16/2018   Closed fracture of upper end of humerus 06/16/2018   Pain in right knee 04/12/2018   Spinal stenosis of lumbar region 02/28/2018   On anticoagulant therapy 04/22/2017   Tachycardia-bradycardia syndrome ([place]) 12/23/2016   Accelerated hypertension    Chronic diastolic CHF (congestive heart failure) ([place]) 08/09/2016   Elevated troponin 08/09/2016   Hypertensive urgency 08/08/2016   Paroxysmal atrial flutter ([place]) 08/08/2016   Coronary artery disease    Hypertensive heart disease    CKD (chronic kidney disease) stage 4, GFR 15-29 ml/min (HCC)    Other specified hypothyroidism    Acute anemia    Pain in the chest    Shortness of breath 05/23/2016   Pre-syncope 05/23/2016   Chest tightness 09/16/2014   Osteoarthritis of right knee 07/02/2014   Acute medial meniscus tear of right knee 07/02/2014   Diabetic peripheral neuropathy associated with type 1 diabetes mellitus ([place]) 09/05/2013   Edema of extremities 04/18/2013   Cellulitis 04/18/2013   Essential hypertension 01/06/2012   Acute renal failure,admitted 01/03/12, after admission for diastolic chf 16/07/9603   S/P CABG (coronary artery bypass graft), 12/04/11 12/07/2011   Hyperlipidemia LDL goal <70 12/03/2011    Class: Diagnosis of   NSTEMI (non-ST elevated myocardial infarction) ([place]) 12/01/2011   Family history of early CAD 12/01/2011    Insulin dependent diabetes mellitus 12/14/2010   DYSPHAGIA UNSPECIFIED 12/14/2010   ABDOMINAL PAIN, EPIGASTRIC 12/14/2010   ESOPHAGEAL STRICTURE 11/28/2009   GERD 11/28/2009   FLATULENCE-GAS-BLOATING 11/28/2009   CONSTIPATION, CHRONIC 12/19/2007   SALPINGO-OOPHORITIS 12/19/2007   MENOPAUSE, SURGICAL 12/19/2007   BACK PAIN, LUMBAR 12/19/2007   ANKLE INJURY, RIGHT 12/19/2007   TOTAL KNEE REPLACEMENT, LEFT, HX OF 12/19/2007    Arliss Journey, PT, DPT  04/21/2021, 2:30 PM  [facility] [address] [place],  [place], [address] Phone: [phone]   Fax:  [phone]  Name: [name] MRN: [id number] Date of Birth: [birth year]

## 2021-04-21 NOTE — Patient Instructions (Signed)
Access Code: [email or web address] URL: [email or web address] Date: 04/21/2021 Prepared by: Janann August  Exercises Standing Balance with Eyes Closed - 1 x daily - 5 x weekly - 3 sets - 30 hold Romberg Stance with Head Nods - 1 x daily - 5 x weekly - 2 sets - 10 reps

## 2021-05-06 ENCOUNTER — Ambulatory Visit: Payer: Medicare Other

## 2021-05-13 ENCOUNTER — Ambulatory Visit: Payer: Medicare Other

## 2021-05-13 DIAGNOSIS — E039 Hypothyroidism, unspecified: Secondary | ICD-10-CM | POA: Diagnosis not present

## 2021-05-19 ENCOUNTER — Ambulatory Visit (INDEPENDENT_AMBULATORY_CARE_PROVIDER_SITE_OTHER): Payer: Medicare Other

## 2021-05-19 ENCOUNTER — Ambulatory Visit: Payer: Medicare Other | Admitting: Physical Therapy

## 2021-05-19 DIAGNOSIS — M216X1 Other acquired deformities of right foot: Secondary | ICD-10-CM | POA: Diagnosis not present

## 2021-05-19 DIAGNOSIS — E119 Type 2 diabetes mellitus without complications: Secondary | ICD-10-CM | POA: Diagnosis not present

## 2021-05-19 DIAGNOSIS — L84 Corns and callosities: Secondary | ICD-10-CM | POA: Diagnosis not present

## 2021-05-19 DIAGNOSIS — I495 Sick sinus syndrome: Secondary | ICD-10-CM

## 2021-05-19 DIAGNOSIS — Z794 Long term (current) use of insulin: Secondary | ICD-10-CM | POA: Diagnosis not present

## 2021-05-20 LAB — CUP PACEART REMOTE DEVICE CHECK
Battery Remaining Longevity: 65 mo
Battery Remaining Percentage: 56 %
Battery Voltage: 2.98 V
Brady Statistic AP VP Percent: 1 %
Brady Statistic AP VS Percent: 99 %
Brady Statistic AS VP Percent: 1 %
Brady Statistic AS VS Percent: 1 %
Brady Statistic RA Percent Paced: 99 %
Brady Statistic RV Percent Paced: 1 %
Date Time Interrogation Session: 20220726123710
Implantable Lead Implant Date: 20171019
Implantable Lead Implant Date: 20171019
Implantable Lead Location: 753859
Implantable Lead Location: 753860
Implantable Pulse Generator Implant Date: 20171019
Lead Channel Impedance Value: 400 Ohm
Lead Channel Impedance Value: 790 Ohm
Lead Channel Pacing Threshold Amplitude: 0.5 V
Lead Channel Pacing Threshold Amplitude: 0.75 V
Lead Channel Pacing Threshold Pulse Width: 0.4 ms
Lead Channel Pacing Threshold Pulse Width: 0.4 ms
Lead Channel Sensing Intrinsic Amplitude: 12 mV
Lead Channel Sensing Intrinsic Amplitude: 2.1 mV
Lead Channel Setting Pacing Amplitude: 2 V
Lead Channel Setting Pacing Amplitude: 2.5 V
Lead Channel Setting Pacing Pulse Width: 0.4 ms
Lead Channel Setting Sensing Sensitivity: 2 mV
Pulse Gen Model: 2272
Pulse Gen Serial Number: 3180458

## 2021-05-26 ENCOUNTER — Ambulatory Visit: Payer: Medicare Other | Attending: Internal Medicine | Admitting: Physical Therapy

## 2021-05-26 ENCOUNTER — Other Ambulatory Visit: Payer: Self-pay

## 2021-05-26 DIAGNOSIS — R42 Dizziness and giddiness: Secondary | ICD-10-CM | POA: Insufficient documentation

## 2021-05-26 DIAGNOSIS — R2689 Other abnormalities of gait and mobility: Secondary | ICD-10-CM | POA: Insufficient documentation

## 2021-05-26 DIAGNOSIS — Z9181 History of falling: Secondary | ICD-10-CM | POA: Insufficient documentation

## 2021-05-26 DIAGNOSIS — R2681 Unsteadiness on feet: Secondary | ICD-10-CM | POA: Insufficient documentation

## 2021-06-02 ENCOUNTER — Other Ambulatory Visit: Payer: Self-pay

## 2021-06-02 ENCOUNTER — Ambulatory Visit: Payer: Medicare Other | Admitting: Physical Therapy

## 2021-06-02 ENCOUNTER — Encounter: Payer: Self-pay | Admitting: Physical Therapy

## 2021-06-02 DIAGNOSIS — Z9181 History of falling: Secondary | ICD-10-CM

## 2021-06-02 DIAGNOSIS — R2681 Unsteadiness on feet: Secondary | ICD-10-CM

## 2021-06-02 DIAGNOSIS — R2689 Other abnormalities of gait and mobility: Secondary | ICD-10-CM | POA: Diagnosis not present

## 2021-06-02 DIAGNOSIS — R42 Dizziness and giddiness: Secondary | ICD-10-CM

## 2021-06-02 NOTE — Therapy (Addendum)
[facility] [address] [place], [place], [address] Phone: [phone]   Fax:  [phone]  Physical Therapy Treatment/Re-Cert  Patient Details  Name: [name] MRN: [id number] Date of Birth: 12-16-1940 Referring Provider (PT): Delrae Rend, MD   Encounter Date: 06/02/2021   PT End of Session - 06/02/21 2119     Visit Number 4    Number of Visits 9   per re-cert on 04/25/08   Date for PT Re-Evaluation 08/01/21    Authorization Type [employer] Medicare    PT Start Time 1150    PT Stop Time 1231    PT Time Calculation (min) 41 min    Equipment Utilized During Treatment Gait belt    Activity Tolerance Patient tolerated treatment well    Behavior During Therapy [facility] for tasks assessed/performed             Past Medical History:  Diagnosis Date   Anemia    Anxiety    Arthritis    "in my hands; knees, back" (09/27/2018)   CAD (coronary artery disease)    a. 40-59% bilaterally 10/2015.   Chronic diastolic CHF (congestive heart failure) ([name])    a. 05/2016 Echo: EF 60-65%, no rwma, Gr1 DD, Ao sclerosis w/o stenosis, triv MR;  b. 07/2016 TEE: EF 55-60%, no rwma, mild MR.   Chronic headaches    CKD (chronic kidney disease), stage III (HCC)    Coronary artery disease    a. 02/2007 Persantine MV: low risk;  b. 11/2011 CABG x 3 (LIMA->LAD, VG->OM, VG->RCA);  c. 05/2016 MV: EF >65%, no isch/infarct, horiz ST dep in I, II, V5-V6.   Depression    Diverticulosis    Esophageal stricture    GERD (gastroesophageal reflux disease)    Hemorrhoids    Hiatal hernia    Hyperkalemia    a. ARB stopped due to this.   Hyperlipidemia    Hypertension    Hypertensive heart disease    Hypothyroidism    Major depressive disorder with anxious distress 09/05/2019   Mild cognitive impairment    a. seen by neurology.   Mild vascular neurocognitive disorder ([name]) 09/05/2019   Myocardial infarction ([name]) 12/07/2011   PAF (paroxysmal atrial  fibrillation) ([name])    a. post-op CABG.   Pain    RIGHT KNEE PAIN - TORN RIGHT MEDIAL MENISCUS   Paroxysmal atrial flutter ([place])    a. 07/2016 s/p TEE & DCCV;  b. 07/2016 Recurrent PAFlutter req initiation of amio & PPM in setting of tachy-brady;  c. CHA2DS2VASc = 7-->Xarelto 15 mg QD.   Pneumonia    "twice" (09/27/2018)   PONV (postoperative nausea and vomiting)    Presence of permanent cardiac pacemaker 08/12/2016   S/P CABG (coronary artery bypass graft), 12/04/11 12/07/2011   LIMA to LAD, SVG to OM, SVG to RCA   Sinus bradycardia    a. not on BB due to this.   Skin cancer    "face" (09/27/2018)   Tachy-brady syndrome ([name])    a. 07/2016 Jxnl brady following DCCV, recurrent Aflutter-->amio + SJM 2272 [employer]urity MRI DC PPM (ser # [id number]).   Type II diabetes mellitus ([name])     Past Surgical History:  Procedure Laterality Date   ABDOMINAL HYSTERECTOMY  1980's   ANKLE FRACTURE SURGERY Right    "put pins both side right ankle"   BACK SURGERY  2006   "cyst growing near my spine"   CARDIAC CATHETERIZATION  12/02/2011   mild  LV dysfunction with mod hypocontractility of mid-distal anterolateral wall; CAD w/ostial tapering of L [place] with 50% diffuse ostial narrowing of LAD, 99% eccentric focal prox LAD stenosis followed by 70% prox LAD stenosis after 1st diag, 20% mid LAD narrowing; 80% ostial-to-prox L Cfx stenosis & 40-50% irregularity of RCA (Dr. Corky Downs)   CARDIOVERSION N/A 08/11/2016   Procedure: CARDIOVERSION;  Surgeon: Lelon Perla, MD;  Location: [facility] ENDOSCOPY;  Service: Cardiovascular;  Laterality: N/A;   CATARACT EXTRACTION W/ INTRAOCULAR LENS  IMPLANT, BILATERAL Bilateral ~ 2010   [place] GRAFT  12/04/2011   Procedure: CORONARY ARTERY BYPASS GRAFTING (CABG);  Surgeon: Tharon Aquas Adelene Idler, MD;  Location: [facility];  Service: Open Heart Surgery;  Laterality: N/A;  CABG x three,  using left internal mammary artery, and right leg greater saphenous  vein harvested endoscopically   CORONARY STENT INTERVENTION N/A 09/27/2018   Procedure: CORONARY STENT INTERVENTION;  Surgeon: Troy Sine, MD;  Location: [facility] CV LAB;  Service: Cardiovascular;  Laterality: N/A;   DILATION AND CURETTAGE OF UTERUS     "a couple times"   EP IMPLANTABLE DEVICE N/A 08/12/2016   Procedure: Pacemaker Implant;  Surgeon: Will Meredith Leeds, MD;  Location: [facility] CV LAB;  Service: Cardiovascular;  Laterality: N/A;   ESOPHAGOGASTRODUODENOSCOPY (EGD) WITH ESOPHAGEAL DILATION     FRACTURE SURGERY     JOINT REPLACEMENT     KNEE ARTHROSCOPY WITH MEDIAL MENISECTOMY Right 07/02/2014   Procedure: RIGHT KNEE ARTHROSCOPY WITH PARTIAL MEDIAL MENISTECTOMY, ABRASION CONDROPLASTYU OF PATELLA,ABRASION CONDROPLASTY OF MEDIAL FEMEROL CONDYL, MICROFRACTURE OF MEDIAL FEMEROL CONDYL;  Surgeon: Tobi Bastos, MD;  Location: [facility] ORS;  Service: Orthopedics;  Laterality: Right;   LEFT HEART CATH AND CORS/GRAFTS ANGIOGRAPHY N/A 09/06/2018   Procedure: LEFT HEART CATH AND CORS/GRAFTS ANGIOGRAPHY;  Surgeon: Troy Sine, MD;  Location: [facility] CV LAB;  Service: Cardiovascular;  Laterality: N/A;   LEFT HEART CATHETERIZATION WITH CORONARY ANGIOGRAM N/A 12/02/2011   Procedure: LEFT HEART CATHETERIZATION WITH CORONARY ANGIOGRAM;  Surgeon: Troy Sine, MD;  Location: [facility] CATH LAB;  Service: Cardiovascular;  Laterality: N/A;  Coronary angiogram, possible PCI   TEE WITHOUT CARDIOVERSION N/A 08/11/2016   Procedure: TRANSESOPHAGEAL ECHOCARDIOGRAM (TEE);  Surgeon: Lelon Perla, MD;  Location: [facility] ENDOSCOPY;  Service: Cardiovascular;  Laterality: N/A;   TEE WITHOUT CARDIOVERSION N/A 05/18/2019   Procedure: TRANSESOPHAGEAL ECHOCARDIOGRAM (TEE);  Surgeon: [name], MD;  Location: [facility] ENDOSCOPY;  Service: Cardiovascular;  Laterality: N/A;   [place] Left ~ 2006   TRANSTHORACIC ECHOCARDIOGRAM  02/19/2013   EF 94-49%, grade 1 diastolic  dysfunction; mildly thickend/calcified AV leaflets; mildly calcidied MV annulus; mild TR    There were no vitals filed for this visit.   Subjective Assessment - 06/02/21 1157     Subjective Dizziness is better. Has not been doing her exercises at home.    Patient is accompained by: Family member   pt's daughter [name]   Pertinent History DM2, tachy-brady syndrome, skin cancer, s/p CABG 12/04/11, presence of permanent cardiac pacemaker, paroxysmal atrial flutter,  MI, mild vascular neurocognitive disorder, depression, hypothyroisidm, HTN, hiatal hernia, GERD,  CAD, CKD stage 3, CHF, arthritis, and anemia.    Limitations Walking    Patient Stated Goals wants to learn to walk better.    Currently in Pain? No/denies                [facility]  PT Assessment - 06/02/21 1201       Assessment   Medical Diagnosis falls/imbalance    Referring Provider (PT) Delrae Rend, MD    Onset Date/Surgical Date 03/19/21      Prior Function   Level of Independence Independent      Standardized Balance Assessment   Standardized Balance Assessment Berg Balance Test      Berg Balance Test   Sit to Stand Able to stand without using hands and stabilize independently    Standing Unsupported Able to stand safely 2 minutes    Sitting with Back Unsupported but Feet Supported on Floor or Stool Able to sit safely and securely 2 minutes    Stand to Sit Sits safely with minimal use of hands    Transfers Able to transfer safely, minor use of hands    Standing Unsupported with Eyes Closed Able to stand 10 seconds safely    Standing Unsupported with Feet Together Needs help to attain position but able to stand for 30 seconds with feet together    From Standing, Reach Forward with Outstretched Arm Can reach forward >12 cm safely (5")    From Standing Position, Pick up Object from Floor Able to pick up shoe, needs supervision    From Standing Position, Turn to Look Behind Over each Shoulder Looks behind from both  sides and weight shifts well    Turn 360 Degrees Able to turn 360 degrees safely but slowly   12.43 to L, 9.89 to R   Standing Unsupported, Alternately Place Feet on Step/Stool Able to complete >2 steps/needs minimal assist    Standing Unsupported, One Foot in Front Able to take small step independently and hold 30 seconds    Standing on One Leg Tries to lift leg/unable to hold 3 seconds but remains standing independently    Total Score 41    Berg comment: 41/56                   Access Code: [email or web address] URL: [email or web address] Date: 06/02/2021 Prepared by: Janann August  Reviewed standing balance and added bolded exercises below for HEP:   Exercises Standing Balance with Eyes Closed - 1 x daily - 5 x weekly - 3 sets - 30 hold Romberg Stance with Head Nods - 1 x daily - 5 x weekly - 2 sets - 10 reps Sit to Stand - 1 x daily - 5 x weekly - 2 sets - 5 reps Seated Heel Toe Raises - 1 x daily - 5 x weekly - 2 sets - 10 reps Seated Long Arc Quad - 1 x daily - 5 x weekly - 2 sets - 10 reps        Therapeutic Activity:  Pt returns to PT today after a month. Discussed POC going forwards and adding visits to continue to work on balance, dizziness, gait, and strength. Pt has performed her exercises 1x at home. Due to distance from clinic, pt's copay, and daughter's schedule will plan to recert for 1x week for 4-5 weeks and daughter will come to pt's house to work on exercises with her for incr compliance. Pt and daughter in agreement with this plan.   5x sit <> stand: 26.38 seconds with no UE support       PT Education - 06/02/21 2118     Education Details see TA, reviewed HEP and added strengthening exercises    Person(s) Educated Patient;Child(ren)    Methods Explanation;Demonstration    Comprehension Verbalized understanding;Returned  demonstration              PT Short Term Goals - 04/21/21 1425       PT SHORT TERM GOAL #1   Title Pt will  undergo further vestibular assessment with LTG to be written as appropriate. ALL STGS DUE 04/28/21    Baseline performed on 04/07/21 with LTG written.    Time 4    Period Weeks    Status Achieved      PT SHORT TERM GOAL #2   Title Perform BERG with LTG to be written as appropriate.    Time 4    Period Weeks    Status New      PT SHORT TERM GOAL #3   Title Pt and pt's daughter will verbalize understanding of fall prevention in the home environment.    Time 4    Period Weeks    Status New      PT SHORT TERM GOAL #4   Title Pt will improve gait speed to at least 1.4 ft/sec with SPC vs. appropriate AD in order to demo decr fall risk.    Baseline 1.17 ft/sec    Time 4    Period Weeks    Status New            Revised STGs:   PT Short Term Goals - 06/03/21 1235       PT SHORT TERM GOAL #1   Title Pt will be independent with initial HEP with daughter's supervision for incr compliance at home. ALL STGS DUE 06/24/21    Time 3    Period Weeks    Status New    Target Date 06/24/21      PT SHORT TERM GOAL #2   Title Pt will perform 5x sit <> stand without UE support in 23 seconds or less in order to demo improved BLE strength.    Baseline 26.38 seconds    Time 3    Period Weeks    Status New                PT Long Term Goals - 06/02/21 1159       PT LONG TERM GOAL #1   Title Pt will report no dizziness when rolling in bed and resolution of L horizontal canal BPPV to improve functional mobility. ALL LTGS DUE 05/26/21    Baseline mild dizziness when rolling, worse on R side, L horizontal canal cupulolithasis    Time 8    Period Weeks    Status Revised      PT LONG TERM GOAL #2   Title BERG goal to be written as appropriate in order to demo decr fall risk.    Baseline 41/56 on 06/03/21    Time 8    Period Weeks    Status New      PT LONG TERM GOAL #3   Title Pt will decr TUG time to 20 seconds or less with SPC in order to demo decr fall risk.    Baseline 24.78  seconds    Time 8    Period Weeks    Status New      PT LONG TERM GOAL #4   Title Pt will improve 5x sit <> stand time to 20 seconds or less without UE support in order to demo improved BLE strength and balance.    Baseline 26.84 seconds; 26.38 seconds on 06/02/21    Time 8    Period Weeks  Status Not Met      PT LONG TERM GOAL #5   Title Pt will be independent with final HEP with daughter's supervision in order to build upon functional gains made in therapy    Baseline pt has not been performing her HEP at home.    Time 8    Period Weeks    Status Not Met             Revised/ongoing LTGs:     PT Long Term Goals - 06/03/21 1239       PT LONG TERM GOAL #1   Title Pt will be independent with final HEP with daughter's supervision in order to build upon functional gains made in therapy. ALL LTGS DUE 07/10/21    Baseline --    Time 5    Period Weeks    Status Revised    Target Date 07/10/21      PT LONG TERM GOAL #2   Title Pt will improve BERG to at least a 46/56 in order to demo decr fall risk.    Baseline 41/56 on 06/03/21    Time 5    Period Weeks    Status Revised      PT LONG TERM GOAL #3   Title Pt will decr TUG time to 20 seconds or less with SPC in order to demo decr fall risk.    Baseline 24.78 seconds    Time 5    Period Weeks    Status On-going      PT LONG TERM GOAL #4   Title Pt will improve 5x sit <> stand time to 20 seconds or less without UE support in order to demo improved BLE strength and balance.    Baseline 26.84 seconds; 26.38 seconds on 06/02/21    Time 5    Period Weeks    Status On-going      PT LONG TERM GOAL #5   Title --    Baseline --    Time --    Period --    Status --                Plan - 06/03/21 1229     Clinical Impression Statement Pt returns to PT today after >1 month due to provider being out and pt on vacation/not feeling well. Pt has reported an improvement in her dizziness since eval. Began to check LTGs  today. Pt did not meet LTG #4 and #5. Pt is not compliant with her HEP at home and pt performed 5x sit <> stand in 26.38 seconds with no UE support (at eval was 26.84 seconds). Performed the BERG today with pt scoring a 41/56, indicating that pt is at a signficant risk for falls. Reviewed pt's current HEP and added strengthening as appropriate. Unable to check remainder of LTGs due to time constraints. Will re-cert for an additional 4 visits( due to copay and distance from clinic) to continue to address BLE strengthening, balance, dizzines, gait training, and functional mobility in order to decr pt's fall risk.    Personal Factors and Comorbidities Time since onset of injury/illness/exacerbation;Past/Current Experience;Comorbidity 3+    Comorbidities DM2, tachy-brady syndrome, skin cancer, s/p CABG 12/04/11, presence of permanent cardiac pacemaker, paroxysmal atrial flutter,  MI, mild vascular neurocognitive disorder, depression, hypothyroisidm, HTN, hiatal hernia, GERD,  CAD, CKD stage 3, CHF, arthritis, and anemia.    Examination-Activity Limitations Stairs;Transfers;Locomotion Level;Bend    Examination-Participation Restrictions Community Activity;Cleaning;Driving    Stability/Clinical Decision Making Evolving/Moderate  complexity    Rehab Potential Good    PT Frequency 1x / week    PT Duration 8 weeks   4-5 visits over 8 weeks   PT Treatment/Interventions ADLs/Self Care Home Management;Canalith Repostioning;Functional mobility training;Stair training;Gait training;Therapeutic activities;Therapeutic exercise;DME Instruction;Balance training;Neuromuscular re-education;Patient/family education;Vestibular    PT Next Visit Plan work on BLE strengthening. standing balance -SLS, narrow BOS. progress VOR x1 as appropriate and review. corner balance with eyes closed and head motions.    PT Home Exercise Plan [email or web address] and seated VOR x1 (horizontal/vertical)    Consulted and Agree with Plan of Care Patient;Family  member/caregiver    Family Member Consulted pt's daughter, [name]             Patient will benefit from skilled therapeutic intervention in order to improve the following deficits and impairments:  Abnormal gait, Decreased activity tolerance, Decreased balance, Decreased coordination, Decreased safety awareness, Dizziness, Decreased strength, Difficulty walking, Impaired sensation  Visit Diagnosis: Dizziness and giddiness  Unsteadiness on feet  Other abnormalities of gait and mobility  History of falling     Problem List Patient Active Problem List   Diagnosis Date Noted   Acute kidney injury superimposed on chronic kidney disease ([place]) 10/03/2020   Nausea & vomiting 10/03/2020   Abdominal pain 10/03/2020   Mild vascular neurocognitive disorder 09/05/2019   Major depressive disorder with anxious distress 09/05/2019   Coag negative Staphylococcus bacteremia 05/21/2019   Acute blood loss anemia 05/21/2019   Anemia of chronic disease 05/21/2019   Anemia in chronic kidney disease 05/21/2019   SSS (sick sinus syndrome) (HCC)    Paroxysmal atrial fibrillation (HCC)    Acute metabolic encephalopathy    Retroperitoneal hematoma    Muscle hemorrhage    S/P placement of cardiac pacemaker    SIRS (systemic inflammatory response syndrome) ([place]) 05/15/2019   Acute encephalopathy 05/15/2019   Status post coronary artery stent placement    CAD S/P percutaneous coronary angioplasty 09/27/2018   Abnormal nuclear stress test    Pain of left shoulder joint on movement 06/16/2018   Closed fracture of upper end of humerus 06/16/2018   Pain in right knee 04/12/2018   Spinal stenosis of lumbar region 02/28/2018   On anticoagulant therapy 04/22/2017   Tachycardia-bradycardia syndrome ([place]) 12/23/2016   Accelerated hypertension    Chronic diastolic CHF (congestive heart failure) ([place]) 08/09/2016   Elevated troponin 08/09/2016   Hypertensive urgency 08/08/2016   Paroxysmal atrial  flutter ([place]) 08/08/2016   Coronary artery disease    Hypertensive heart disease    CKD (chronic kidney disease) stage 4, GFR 15-29 ml/min (HCC)    Other specified hypothyroidism    Acute anemia    Pain in the chest    Shortness of breath 05/23/2016   Pre-syncope 05/23/2016   Chest tightness 09/16/2014   Osteoarthritis of right knee 07/02/2014   Acute medial meniscus tear of right knee 07/02/2014   Diabetic peripheral neuropathy associated with type 1 diabetes mellitus ([place]) 09/05/2013   Edema of extremities 04/18/2013   Cellulitis 04/18/2013   Essential hypertension 01/06/2012   Acute renal failure,admitted 01/03/12, after admission for diastolic chf 71/03/2693   S/P CABG (coronary artery bypass graft), 12/04/11 12/07/2011   Hyperlipidemia LDL goal <70 12/03/2011    Class: Diagnosis of   NSTEMI (non-ST elevated myocardial infarction) ([place]) 12/01/2011   Family history of early CAD 12/01/2011   Insulin dependent diabetes mellitus 12/14/2010   DYSPHAGIA UNSPECIFIED 12/14/2010   ABDOMINAL PAIN, EPIGASTRIC 12/14/2010   ESOPHAGEAL STRICTURE  11/28/2009   GERD 11/28/2009   FLATULENCE-GAS-BLOATING 11/28/2009   CONSTIPATION, CHRONIC 12/19/2007   SALPINGO-OOPHORITIS 12/19/2007   MENOPAUSE, SURGICAL 12/19/2007   BACK PAIN, LUMBAR 12/19/2007   ANKLE INJURY, RIGHT 12/19/2007   TOTAL KNEE REPLACEMENT, LEFT, HX OF 12/19/2007    Arliss Journey, PT, DPT  06/03/2021, 12:35 PM  [facility] [address] [place], [place], [address] Phone: [phone]   Fax:  [phone]  Name: [name] MRN: [id number] Date of Birth: 1941/08/04

## 2021-06-02 NOTE — Patient Instructions (Signed)
Access Code: [email or web address] URL: [email or web address] Date: 06/02/2021 Prepared by: Janann August  Exercises Standing Balance with Eyes Closed - 1 x daily - 5 x weekly - 3 sets - 30 hold Romberg Stance with Head Nods - 1 x daily - 5 x weekly - 2 sets - 10 reps Sit to Stand - 1 x daily - 5 x weekly - 2 sets - 5 reps Seated Heel Toe Raises - 1 x daily - 5 x weekly - 2 sets - 10 reps Seated Long Arc Quad - 1 x daily - 5 x weekly - 2 sets - 10 reps

## 2021-06-03 NOTE — Addendum Note (Signed)
Addended by: Arliss Journey on: 06/03/2021 12:43 PM   Modules accepted: Orders

## 2021-06-09 ENCOUNTER — Ambulatory Visit: Payer: Medicare Other | Admitting: Physical Therapy

## 2021-06-09 ENCOUNTER — Other Ambulatory Visit: Payer: Self-pay

## 2021-06-09 ENCOUNTER — Encounter: Payer: Self-pay | Admitting: Physical Therapy

## 2021-06-09 DIAGNOSIS — R42 Dizziness and giddiness: Secondary | ICD-10-CM

## 2021-06-09 DIAGNOSIS — R2689 Other abnormalities of gait and mobility: Secondary | ICD-10-CM | POA: Diagnosis not present

## 2021-06-09 DIAGNOSIS — R2681 Unsteadiness on feet: Secondary | ICD-10-CM

## 2021-06-09 DIAGNOSIS — Z9181 History of falling: Secondary | ICD-10-CM | POA: Diagnosis not present

## 2021-06-09 NOTE — Patient Instructions (Signed)
Gaze Stabilization: Sitting    Keeping eyes on target on wall 2-3 feet away, tilt head down 15-30 and move head side to side for 30 seconds. Repeat while moving head up and down for 30 seconds. Do 2 sessions per day.    Gaze Stabilization: Tip Card  1.Target must remain in focus, not blurry, and appear stationary while head is in motion. 2.Perform exercises with small head movements (45 to either side of midline). 3.Increase speed of head motion so long as target is in focus. 4.If you wear eyeglasses, be sure you can see target through lens (therapist will give specific instructions for bifocal / progressive lenses). 5.These exercises may provoke dizziness or nausea. Work through these symptoms. If too dizzy, slow head movement slightly. Rest between each exercise. 6.Exercises demand concentration; avoid distractions. 7.For safety, perform standing exercises close to a counter, wall, corner, or next to someone.

## 2021-06-09 NOTE — Therapy (Signed)
[facility] [address] [place], [place], [address] Phone: [phone]   Fax:  [phone]  Physical Therapy Treatment  Patient Details  Name: [name] MRN: [id number] Date of Birth: 07-Sep-1941 Referring Provider (PT): Delrae Rend, MD   Encounter Date: 06/09/2021   PT End of Session - 06/09/21 1417     Visit Number 5    Number of Visits 9   per re-cert on 10/27/22   Date for PT Re-Evaluation 08/01/21    Authorization Type [employer] Medicare    PT Start Time 1151   pt late   PT Stop Time 1230    PT Time Calculation (min) 39 min    Equipment Utilized During Treatment Gait belt    Activity Tolerance Patient tolerated treatment well    Behavior During Therapy WFL for tasks assessed/performed             Past Medical History:  Diagnosis Date   Anemia    Anxiety    Arthritis    "in my hands; knees, back" (09/27/2018)   CAD (coronary artery disease)    a. 40-59% bilaterally 10/2015.   Chronic diastolic CHF (congestive heart failure) ([place])    a. 05/2016 Echo: EF 60-65%, no rwma, Gr1 DD, Ao sclerosis w/o stenosis, triv MR;  b. 07/2016 TEE: EF 55-60%, no rwma, mild MR.   Chronic headaches    CKD (chronic kidney disease), stage III (HCC)    Coronary artery disease    a. 02/2007 Persantine MV: low risk;  b. 11/2011 CABG x 3 (LIMA->LAD, VG->OM, VG->RCA);  c. 05/2016 MV: EF >65%, no isch/infarct, horiz ST dep in I, II, V5-V6.   Depression    Diverticulosis    Esophageal stricture    GERD (gastroesophageal reflux disease)    Hemorrhoids    Hiatal hernia    Hyperkalemia    a. ARB stopped due to this.   Hyperlipidemia    Hypertension    Hypertensive heart disease    Hypothyroidism    Major depressive disorder with anxious distress 09/05/2019   Mild cognitive impairment    a. seen by neurology.   Mild vascular neurocognitive disorder ([place]) 09/05/2019   Myocardial infarction ([place]) 12/07/2011   PAF (paroxysmal atrial  fibrillation) ([place])    a. post-op CABG.   Pain    RIGHT KNEE PAIN - TORN RIGHT MEDIAL MENISCUS   Paroxysmal atrial flutter ([place])    a. 07/2016 s/p TEE & DCCV;  b. 07/2016 Recurrent PAFlutter req initiation of amio & PPM in setting of tachy-brady;  c. CHA2DS2VASc = 7-->Xarelto 15 mg QD.   Pneumonia    "twice" (09/27/2018)   PONV (postoperative nausea and vomiting)    Presence of permanent cardiac pacemaker 08/12/2016   S/P CABG (coronary artery bypass graft), 12/04/11 12/07/2011   LIMA to LAD, SVG to OM, SVG to RCA   Sinus bradycardia    a. not on BB due to this.   Skin cancer    "face" (09/27/2018)   Tachy-brady syndrome ([place])    a. 07/2016 Jxnl brady following DCCV, recurrent Aflutter-->amio + SJM 2272 [employer] MRI DC PPM (ser # [id number]).   Type II diabetes mellitus ([place])     Past Surgical History:  Procedure Laterality Date   ABDOMINAL HYSTERECTOMY  1980's   ANKLE FRACTURE SURGERY Right    "put pins both side right ankle"   BACK SURGERY  2006   "cyst growing near my spine"   CARDIAC CATHETERIZATION  12/02/2011  mild LV dysfunction with mod hypocontractility of mid-distal anterolateral wall; CAD w/ostial tapering of L [place] with 50% diffuse ostial narrowing of LAD, 99% eccentric focal prox LAD stenosis followed by 70% prox LAD stenosis after 1st diag, 20% mid LAD narrowing; 80% ostial-to-prox L Cfx stenosis & 40-50% irregularity of RCA (Dr. Corky Downs)   CARDIOVERSION N/A 08/11/2016   Procedure: CARDIOVERSION;  Surgeon: Lelon Perla, MD;  Location: [facility] ENDOSCOPY;  Service: Cardiovascular;  Laterality: N/A;   CATARACT EXTRACTION W/ INTRAOCULAR LENS  IMPLANT, BILATERAL Bilateral ~ 2010   [place] GRAFT  12/04/2011   Procedure: CORONARY ARTERY BYPASS GRAFTING (CABG);  Surgeon: Tharon Aquas Adelene Idler, MD;  Location: [facility];  Service: Open Heart Surgery;  Laterality: N/A;  CABG x three,  using left internal mammary artery, and right leg greater saphenous  vein harvested endoscopically   CORONARY STENT INTERVENTION N/A 09/27/2018   Procedure: CORONARY STENT INTERVENTION;  Surgeon: Troy Sine, MD;  Location: [facility] CV LAB;  Service: Cardiovascular;  Laterality: N/A;   DILATION AND CURETTAGE OF UTERUS     "a couple times"   EP IMPLANTABLE DEVICE N/A 08/12/2016   Procedure: Pacemaker Implant;  Surgeon: Will Meredith Leeds, MD;  Location: [facility] CV LAB;  Service: Cardiovascular;  Laterality: N/A;   ESOPHAGOGASTRODUODENOSCOPY (EGD) WITH ESOPHAGEAL DILATION     FRACTURE SURGERY     JOINT REPLACEMENT     KNEE ARTHROSCOPY WITH MEDIAL MENISECTOMY Right 07/02/2014   Procedure: RIGHT KNEE ARTHROSCOPY WITH PARTIAL MEDIAL MENISTECTOMY, ABRASION CONDROPLASTYU OF PATELLA,ABRASION CONDROPLASTY OF MEDIAL FEMEROL CONDYL, MICROFRACTURE OF MEDIAL FEMEROL CONDYL;  Surgeon: Tobi Bastos, MD;  Location: [facility] ORS;  Service: Orthopedics;  Laterality: Right;   LEFT HEART CATH AND CORS/GRAFTS ANGIOGRAPHY N/A 09/06/2018   Procedure: LEFT HEART CATH AND CORS/GRAFTS ANGIOGRAPHY;  Surgeon: Troy Sine, MD;  Location: [facility] LAB;  Service: Cardiovascular;  Laterality: N/A;   LEFT HEART CATHETERIZATION WITH CORONARY ANGIOGRAM N/A 12/02/2011   Procedure: LEFT HEART CATHETERIZATION WITH CORONARY ANGIOGRAM;  Surgeon: Troy Sine, MD;  Location: [facility] CATH LAB;  Service: Cardiovascular;  Laterality: N/A;  Coronary angiogram, possible PCI   TEE WITHOUT CARDIOVERSION N/A 08/11/2016   Procedure: TRANSESOPHAGEAL ECHOCARDIOGRAM (TEE);  Surgeon: Lelon Perla, MD;  Location: [facility] ENDOSCOPY;  Service: Cardiovascular;  Laterality: N/A;   TEE WITHOUT CARDIOVERSION N/A 05/18/2019   Procedure: TRANSESOPHAGEAL ECHOCARDIOGRAM (TEE);  Surgeon: [name], MD;  Location[facility] ENDOSCOPY;  Service: Cardiovascular;  Laterality: N/A;   [place] Left ~ 2006   TRANSTHORACIC ECHOCARDIOGRAM  02/19/2013   EF 45-80%, grade 1 diastolic  dysfunction; mildly thickend/calcified AV leaflets; mildly calcidied MV annulus; mild TR    There were no vitals filed for this visit.   Subjective Assessment - 06/09/21 1153     Subjective No changes since she was last here, has had the chance to try the exercises once.    Patient is accompained by: Family member   pt's daughter [name]   Pertinent History DM2, tachy-brady syndrome, skin cancer, s/p CABG 12/04/11, presence of permanent cardiac pacemaker, paroxysmal atrial flutter,  MI, mild vascular neurocognitive disorder, depression, hypothyroisidm, HTN, hiatal hernia, GERD,  CAD, CKD stage 3, CHF, arthritis, and anemia.    Limitations Walking    Patient Stated Goals wants to learn to walk better.    Currently in Pain? No/denies  [place] Adult PT Treatment/Exercise - 06/09/21 0001       Ambulation/Gait   Ambulation/Gait Yes    Ambulation/Gait Assistance 4: Min guard    Ambulation Distance (Feet) 100 Feet   x2, plus additional clinic distances   Assistive device Straight cane    Gait Pattern Step-through pattern;Decreased step length - right;Decreased step length - left;Decreased arm swing - left;Decreased dorsiflexion - right;Decreased dorsiflexion - left    Ambulation Surface Level;Indoor    Gait Comments cues for proper sequencing and placement with cane to avoid more narrow BOS, pt reports having a walker at home, discussed that this might be a better means for her balance with walking, instructed pt to bring it in next time             Vestibular Treatment/Exercise - 06/09/21 1154       Vestibular Treatment/Exercise   Vestibular Treatment Provided Gaze    Gaze Exercises X1 Viewing Horizontal;X1 Viewing Vertical      X1 Viewing Horizontal   Foot Position seated    Comments 3 x 30 seconds, cues for proper technqiue and to keep eyes on target      X1 Viewing Vertical   Foot Position seated    Comments 2 x 30 seconds, cues  for proper technique                Balance Exercises - 06/09/21 1215       Balance Exercises: Standing   Standing Eyes Opened Foam/compliant surface;Limitations    Standing Eyes Opened Limitations standing on single pillow, x5 reps head turns with feet apart, pt reporting incr dizziness after    Standing Eyes Closed Narrow base of support (BOS);Solid surface;2 reps;30 secs;Limitations    Standing Eyes Closed Limitations feet together    SLS with Vectors Solid surface;Limitations    SLS with Vectors Limitations alternating step taps to 6" step x12 reps B, beginning with UE support > none    Step Ups 6 inch;Intermittent UE support;UE support 1;Forward;Limitations    Step Ups Limitations x10 reps B alternating legs with BUE > single UE support    Partial Tandem Stance Intermittent upper extremity support;3 reps;20 secs;Limitations    Partial Tandem Stance Limitations performed x3 reps B with intermittent taps to chair for balance    Retro Gait 3 reps;Limitations    Retro Gait Limitations down and back 3 reps in // bars with BUE support, cues for step length    Marching Solid surface;Intermittent upper extremity assist;Forwards;Limitations    Marching Limitations forwards with BUE support, down and back x4 reps               PT Education - 06/09/21 1415     Education Details reviewed seated VOR x1 exercises from HEP and provided new handout    Person(s) Educated Patient;Child(ren)    Methods Explanation;Demonstration;Verbal cues    Comprehension Verbalized understanding;Returned demonstration;Verbal cues required              PT Short Term Goals - 06/03/21 1235       PT SHORT TERM GOAL #1   Title Pt will be independent with initial HEP with daughter's supervision for incr compliance at home. ALL STGS DUE 06/24/21    Time 3    Period Weeks    Status New    Target Date 06/24/21      PT SHORT TERM GOAL #2   Title Pt will perform 5x sit <> stand without UE support  in 23  seconds or less in order to demo improved BLE strength.    Baseline 26.38 seconds    Time 3    Period Weeks    Status New               PT Long Term Goals - 06/03/21 1239       PT LONG TERM GOAL #1   Title Pt will be independent with final HEP with daughter's supervision in order to build upon functional gains made in therapy. ALL LTGS DUE 07/10/21    Baseline --    Time 5    Period Weeks    Status Revised    Target Date 07/10/21      PT LONG TERM GOAL #2   Title Pt will improve BERG to at least a 46/56 in order to demo decr fall risk.    Baseline 41/56 on 06/03/21    Time 5    Period Weeks    Status Revised      PT LONG TERM GOAL #3   Title Pt will decr TUG time to 20 seconds or less with SPC in order to demo decr fall risk.    Baseline 24.78 seconds    Time 5    Period Weeks    Status On-going      PT LONG TERM GOAL #4   Title Pt will improve 5x sit <> stand time to 20 seconds or less without UE support in order to demo improved BLE strength and balance.    Baseline 26.84 seconds; 26.38 seconds on 06/02/21    Time 5    Period Weeks    Status On-going      PT LONG TERM GOAL #5   Title --    Baseline --    Time --    Period --    Status --                   Plan - 06/09/21 1422     Clinical Impression Statement Focused on reviewing previous seated VOR x1 exercise from HEP as pt had not been performing at home. Pt needing demo/verbal cues for proper technique, pt's daughter present to assist with cues when pt performs at home. Remainder of session focused on functional BLE strengthening, SLS activities, and balance with more narrow BOS/eyes closed. Pt tolerated session well, most challenged by static balance with more narrow BOS.    Personal Factors and Comorbidities Time since onset of injury/illness/exacerbation;Past/Current Experience;Comorbidity 3+    Comorbidities DM2, tachy-brady syndrome, skin cancer, s/p CABG 12/04/11, presence of permanent  cardiac pacemaker, paroxysmal atrial flutter,  MI, mild vascular neurocognitive disorder, depression, hypothyroisidm, HTN, hiatal hernia, GERD,  CAD, CKD stage 3, CHF, arthritis, and anemia.    Examination-Activity Limitations Stairs;Transfers;Locomotion Level;Bend    Examination-Participation Restrictions Community Activity;Cleaning;Driving    Stability/Clinical Decision Making Evolving/Moderate complexity    Rehab Potential Good    PT Frequency 1x / week    PT Duration 8 weeks   4-5 visits over 8 weeks   PT Treatment/Interventions ADLs/Self Care Home Management;Canalith Repostioning;Functional mobility training;Stair training;Gait training;Therapeutic activities;Therapeutic exercise;DME Instruction;Balance training;Neuromuscular re-education;Patient/family education;Vestibular    PT Next Visit Plan pt to bring in her walker from home. work on Dentist. standing balance -SLS, narrow BOS. progress VOR x1 as appropriate and review. corner balance with eyes closed and head motions.    PT Home Exercise Plan [id number] and seated VOR x1 (horizontal/vertical)    Consulted and Agree with Plan of Care  Patient;Family member/caregiver    Family Member Consulted pt's daughter, [name]             Patient will benefit from skilled therapeutic intervention in order to improve the following deficits and impairments:  Abnormal gait, Decreased activity tolerance, Decreased balance, Decreased coordination, Decreased safety awareness, Dizziness, Decreased strength, Difficulty walking, Impaired sensation  Visit Diagnosis: Dizziness and giddiness  Unsteadiness on feet  Other abnormalities of gait and mobility     Problem List Patient Active Problem List   Diagnosis Date Noted   Acute kidney injury superimposed on chronic kidney disease ([place]) 10/03/2020   Nausea & vomiting 10/03/2020   Abdominal pain 10/03/2020   Mild vascular neurocognitive disorder 09/05/2019   Major depressive disorder  with anxious distress 09/05/2019   Coag negative Staphylococcus bacteremia 05/21/2019   Acute blood loss anemia 05/21/2019   Anemia of chronic disease 05/21/2019   Anemia in chronic kidney disease 05/21/2019   SSS (sick sinus syndrome) (HCC)    Paroxysmal atrial fibrillation (HCC)    Acute metabolic encephalopathy    Retroperitoneal hematoma    Muscle hemorrhage    S/P placement of cardiac pacemaker    SIRS (systemic inflammatory response syndrome) ([place]) 05/15/2019   Acute encephalopathy 05/15/2019   Status post coronary artery stent placement    CAD S/P percutaneous coronary angioplasty 09/27/2018   Abnormal nuclear stress test    Pain of left shoulder joint on movement 06/16/2018   Closed fracture of upper end of humerus 06/16/2018   Pain in right knee 04/12/2018   Spinal stenosis of lumbar region 02/28/2018   On anticoagulant therapy 04/22/2017   Tachycardia-bradycardia syndrome ([place]) 12/23/2016   Accelerated hypertension    Chronic diastolic CHF (congestive heart failure) ([place]) 08/09/2016   Elevated troponin 08/09/2016   Hypertensive urgency 08/08/2016   Paroxysmal atrial flutter ([place]) 08/08/2016   Coronary artery disease    Hypertensive heart disease    CKD (chronic kidney disease) stage 4, GFR 15-29 ml/min (HCC)    Other specified hypothyroidism    Acute anemia    Pain in the chest    Shortness of breath 05/23/2016   Pre-syncope 05/23/2016   Chest tightness 09/16/2014   Osteoarthritis of right knee 07/02/2014   Acute medial meniscus tear of right knee 07/02/2014   Diabetic peripheral neuropathy associated with type 1 diabetes mellitus ([place]) 09/05/2013   Edema of extremities 04/18/2013   Cellulitis 04/18/2013   Essential hypertension 01/06/2012   Acute renal failure,admitted 01/03/12, after admission for diastolic chf 41/32/4401   S/P CABG (coronary artery bypass graft), 12/04/11 12/07/2011   Hyperlipidemia LDL goal <70 12/03/2011    Class: Diagnosis of   NSTEMI  (non-ST elevated myocardial infarction) ([place]) 12/01/2011   Family history of early CAD 12/01/2011   Insulin dependent diabetes mellitus 12/14/2010   DYSPHAGIA UNSPECIFIED 12/14/2010   ABDOMINAL PAIN, EPIGASTRIC 12/14/2010   ESOPHAGEAL STRICTURE 11/28/2009   GERD 11/28/2009   FLATULENCE-GAS-BLOATING 11/28/2009   CONSTIPATION, CHRONIC 12/19/2007   SALPINGO-OOPHORITIS 12/19/2007   MENOPAUSE, SURGICAL 12/19/2007   BACK PAIN, LUMBAR 12/19/2007   ANKLE INJURY, RIGHT 12/19/2007   TOTAL KNEE REPLACEMENT, LEFT, HX OF 12/19/2007    Arliss Journey, PT, DPT  06/09/2021, 2:23 PM  [facility] [address] [place], [place], [address] Phone: [phone]   Fax:  [phone]  Name: [name] MRN: [id number] Date of Birth: 1940/12/24

## 2021-06-12 NOTE — Progress Notes (Signed)
Remote pacemaker transmission.   

## 2021-06-19 ENCOUNTER — Ambulatory Visit: Payer: Medicare Other

## 2021-06-19 DIAGNOSIS — M8580 Other specified disorders of bone density and structure, unspecified site: Secondary | ICD-10-CM | POA: Diagnosis not present

## 2021-06-19 DIAGNOSIS — Z794 Long term (current) use of insulin: Secondary | ICD-10-CM | POA: Diagnosis not present

## 2021-06-19 DIAGNOSIS — E039 Hypothyroidism, unspecified: Secondary | ICD-10-CM | POA: Diagnosis not present

## 2021-06-19 DIAGNOSIS — E1142 Type 2 diabetes mellitus with diabetic polyneuropathy: Secondary | ICD-10-CM | POA: Diagnosis not present

## 2021-06-19 DIAGNOSIS — E785 Hyperlipidemia, unspecified: Secondary | ICD-10-CM | POA: Diagnosis not present

## 2021-06-19 DIAGNOSIS — I10 Essential (primary) hypertension: Secondary | ICD-10-CM | POA: Diagnosis not present

## 2021-06-22 DIAGNOSIS — Z974 Presence of external hearing-aid: Secondary | ICD-10-CM | POA: Diagnosis not present

## 2021-06-22 DIAGNOSIS — Z789 Other specified health status: Secondary | ICD-10-CM | POA: Diagnosis not present

## 2021-06-22 DIAGNOSIS — H9193 Unspecified hearing loss, bilateral: Secondary | ICD-10-CM | POA: Diagnosis not present

## 2021-06-26 ENCOUNTER — Ambulatory Visit: Payer: Medicare Other | Admitting: Physical Therapy

## 2021-06-26 DIAGNOSIS — R040 Epistaxis: Secondary | ICD-10-CM | POA: Diagnosis not present

## 2021-06-26 DIAGNOSIS — J0100 Acute maxillary sinusitis, unspecified: Secondary | ICD-10-CM | POA: Diagnosis not present

## 2021-07-03 ENCOUNTER — Other Ambulatory Visit: Payer: Self-pay

## 2021-07-03 ENCOUNTER — Ambulatory Visit: Payer: Medicare Other | Attending: Internal Medicine

## 2021-07-03 DIAGNOSIS — R42 Dizziness and giddiness: Secondary | ICD-10-CM | POA: Insufficient documentation

## 2021-07-03 DIAGNOSIS — Z9181 History of falling: Secondary | ICD-10-CM | POA: Insufficient documentation

## 2021-07-03 DIAGNOSIS — R2681 Unsteadiness on feet: Secondary | ICD-10-CM | POA: Diagnosis not present

## 2021-07-03 DIAGNOSIS — R2689 Other abnormalities of gait and mobility: Secondary | ICD-10-CM | POA: Diagnosis not present

## 2021-07-03 DIAGNOSIS — M6281 Muscle weakness (generalized): Secondary | ICD-10-CM | POA: Diagnosis not present

## 2021-07-03 NOTE — Therapy (Signed)
[facility] [address] [place], [place], [address] Phone: [phone]   Fax:  [phone]  Physical Therapy Treatment  Patient Details  Name: [name] MRN: [id number] Date of Birth: 1941/09/12 Referring Provider (PT): Delrae Rend, MD   Encounter Date: 07/03/2021   PT End of Session - 07/03/21 1149     Visit Number 6    Number of Visits 9   per re-cert on 06/02/37   Date for PT Re-Evaluation 08/01/21    Authorization Type [employer] Medicare    PT Start Time 1148    PT Stop Time 1230    PT Time Calculation (min) 42 min    Equipment Utilized During Treatment Gait belt    Activity Tolerance Patient tolerated treatment well    Behavior During Therapy WFL for tasks assessed/performed             Past Medical History:  Diagnosis Date   Anemia    Anxiety    Arthritis    "in my hands; knees, back" (09/27/2018)   CAD (coronary artery disease)    a. 40-59% bilaterally 10/2015.   Chronic diastolic CHF (congestive heart failure) ([place])    a. 05/2016 Echo: EF 60-65%, no rwma, Gr1 DD, Ao sclerosis w/o stenosis, triv MR;  b. 07/2016 TEE: EF 55-60%, no rwma, mild MR.   Chronic headaches    CKD (chronic kidney disease), stage III (HCC)    Coronary artery disease    a. 02/2007 Persantine MV: low risk;  b. 11/2011 CABG x 3 (LIMA->LAD, VG->OM, VG->RCA);  c. 05/2016 MV: EF >65%, no isch/infarct, horiz ST dep in I, II, V5-V6.   Depression    Diverticulosis    Esophageal stricture    GERD (gastroesophageal reflux disease)    Hemorrhoids    Hiatal hernia    Hyperkalemia    a. ARB stopped due to this.   Hyperlipidemia    Hypertension    Hypertensive heart disease    Hypothyroidism    Major depressive disorder with anxious distress 09/05/2019   Mild cognitive impairment    a. seen by neurology.   Mild vascular neurocognitive disorder ([place]) 09/05/2019   Myocardial infarction ([place]) 12/07/2011   PAF (paroxysmal atrial  fibrillation) ([place])    a. post-op CABG.   Pain    RIGHT KNEE PAIN - TORN RIGHT MEDIAL MENISCUS   Paroxysmal atrial flutter ([place])    a. 07/2016 s/p TEE & DCCV;  b. 07/2016 Recurrent PAFlutter req initiation of amio & PPM in setting of tachy-brady;  c. CHA2DS2VASc = 7-->Xarelto 15 mg QD.   Pneumonia    "twice" (09/27/2018)   PONV (postoperative nausea and vomiting)    Presence of permanent cardiac pacemaker 08/12/2016   S/P CABG (coronary artery bypass graft), 12/04/11 12/07/2011   LIMA to LAD, SVG to OM, SVG to RCA   Sinus bradycardia    a. not on BB due to this.   Skin cancer    "face" (09/27/2018)   Tachy-brady syndrome ([place])    a. 07/2016 Jxnl brady following DCCV, recurrent Aflutter-->amio + SJM 2272 [employer]urity MRI DC PPM (ser # [id number]).   Type II diabetes mellitus ([place])     Past Surgical History:  Procedure Laterality Date   ABDOMINAL HYSTERECTOMY  1980's   ANKLE FRACTURE SURGERY Right    "put pins both side right ankle"   BACK SURGERY  2006   "cyst growing near my spine"   CARDIAC CATHETERIZATION  12/02/2011   mild  LV dysfunction with mod hypocontractility of mid-distal anterolateral wall; CAD w/ostial tapering of L [place] with 50% diffuse ostial narrowing of LAD, 99% eccentric focal prox LAD stenosis followed by 70% prox LAD stenosis after 1st diag, 20% mid LAD narrowing; 80% ostial-to-prox L Cfx stenosis & 40-50% irregularity of RCA (Dr. Corky Downs)   CARDIOVERSION N/A 08/11/2016   Procedure: CARDIOVERSION;  Surgeon: Lelon Perla, MD;  Location: [facility] ENDOSCOPY;  Service: Cardiovascular;  Laterality: N/A;   CATARACT EXTRACTION W/ INTRAOCULAR LENS  IMPLANT, BILATERAL Bilateral ~ 2010   [place] GRAFT  12/04/2011   Procedure: CORONARY ARTERY BYPASS GRAFTING (CABG);  Surgeon: Tharon Aquas Adelene Idler, MD;  Location: [place];  Service: Open Heart Surgery;  Laterality: N/A;  CABG x three,  using left internal mammary artery, and right leg greater saphenous  vein harvested endoscopically   CORONARY STENT INTERVENTION N/A 09/27/2018   Procedure: CORONARY STENT INTERVENTION;  Surgeon: [place] Sine, MD;  Location: [facility] CV LAB;  Service: Cardiovascular;  Laterality: N/A;   DILATION AND CURETTAGE OF UTERUS     "a couple times"   EP IMPLANTABLE DEVICE N/A 08/12/2016   Procedure: Pacemaker Implant;  Surgeon: Will Meredith Leeds, MD;  Location: [facility] LAB;  Service: Cardiovascular;  Laterality: N/A;   ESOPHAGOGASTRODUODENOSCOPY (EGD) WITH ESOPHAGEAL DILATION     FRACTURE SURGERY     JOINT REPLACEMENT     KNEE ARTHROSCOPY WITH MEDIAL MENISECTOMY Right 07/02/2014   Procedure: RIGHT KNEE ARTHROSCOPY WITH PARTIAL MEDIAL MENISTECTOMY, ABRASION CONDROPLASTYU OF PATELLA,ABRASION CONDROPLASTY OF MEDIAL FEMEROL CONDYL, MICROFRACTURE OF MEDIAL FEMEROL CONDYL;  Surgeon: Tobi Bastos, MD;  Location: [facility] ORS;  Service: Orthopedics;  Laterality: Right;   LEFT HEART CATH AND CORS/GRAFTS ANGIOGRAPHY N/A 09/06/2018   Procedure: LEFT HEART CATH AND CORS/GRAFTS ANGIOGRAPHY;  Surgeon: [place] Sine, MD;  Location: [facility] LAB;  Service: Cardiovascular;  Laterality: N/A;   LEFT HEART CATHETERIZATION WITH CORONARY ANGIOGRAM N/A 12/02/2011   Procedure: LEFT HEART CATHETERIZATION WITH CORONARY ANGIOGRAM;  Surgeon: [place] Sine, MD;  Location[facility] CATH LAB;  Service: Cardiovascular;  Laterality: N/A;  Coronary angiogram, possible PCI   TEE WITHOUT CARDIOVERSION N/A 08/11/2016   Procedure: TRANSESOPHAGEAL ECHOCARDIOGRAM (TEE);  Surgeon: Lelon Perla, MD;  Location: [facility] ENDOSCOPY;  Service: Cardiovascular;  Laterality: N/A;   TEE WITHOUT CARDIOVERSION N/A 05/18/2019   Procedure: TRANSESOPHAGEAL ECHOCARDIOGRAM (TEE);  Surgeon: [name], MD;  Location: [facility] ENDOSCOPY;  Service: Cardiovascular;  Laterality: N/A;   [place] Left ~ 2006   TRANSTHORACIC ECHOCARDIOGRAM  02/19/2013   EF 35-36%, grade 1 diastolic  dysfunction; mildly thickend/calcified AV leaflets; mildly calcidied MV annulus; mild TR    There were no vitals filed for this visit.   Subjective Assessment - 07/03/21 1151     Subjective Patient denies new changes/complaints. Patient reports no falls, has had a few stumbles. Patient reports the dizziness has been better.    Patient is accompained by: Family member   pt's daughter [name]   Pertinent History DM2, tachy-brady syndrome, skin cancer, s/p CABG 12/04/11, presence of permanent cardiac pacemaker, paroxysmal atrial flutter,  MI, mild vascular neurocognitive disorder, depression, hypothyroisidm, HTN, hiatal hernia, GERD,  CAD, CKD stage 3, CHF, arthritis, and anemia.    Limitations Walking    Patient Stated Goals wants to learn to walk better.    Currently in Pain? No/denies  [facility] Adult PT Treatment/Exercise - 07/03/21 0001       Transfers   Transfers Sit to Stand;Stand to Sit    Sit to Stand 5: Supervision    Stand to Sit 5: Supervision      Ambulation/Gait   Ambulation/Gait Yes    Ambulation/Gait Assistance 5: Supervision;4: Min guard    Ambulation/Gait Assistance Details Completed ambulation with various AD including SPC with quad tip x 115 ft, increased balance challenge noted with intemrittent veering CGA intermittently throughout. Then completed x 115 ft with RW, improved stability noted with use of RW. PT educating on this, and encouraging patient to use. Patient/daugther reporting that it may be difficult to use in home due to tight spaces. PT educatin gon use of RW outdoors and long distance, and using SPC for only short distance. PT also educating on purchase options for quad tip for [facility] for improved stability. Daughter verbalize understanding.    Ambulation Distance (Feet) 115 Feet   x 2, plus additional distances   Assistive device Straight cane;Rolling walker    Gait Pattern Step-through pattern;Decreased step length  - right;Decreased step length - left;Decreased arm swing - left;Decreased dorsiflexion - right;Decreased dorsiflexion - left    Ambulation Surface Level;Indoor      Exercises   Exercises Other Exercises    Other Exercises  PT verbal review of exercises. Patient reporting that she is not completing at home. PT provided exercise tracker for hoem to promote improved compliance.                 Balance Exercises - 07/03/21 0001       Balance Exercises: Standing   Standing Eyes Opened Narrow base of support (BOS);Head turns;Foam/compliant surface;Limitations    Standing Eyes Opened Limitations horizontal/vertical head turns x 10 reps with eyes open on airex. increased challenge with vertical > horiz require intermittent touch to // bars.    Standing Eyes Closed Foam/compliant surface;Wide (BOA);3 reps;30 secs;Limitations    Standing Eyes Closed Limitations feet hip width on airex, eyes closed 3 x 30 seconds, increased sway noted.    SLS with Vectors Foam/compliant surface;Intermittent upper extremity assist;Limitations    SLS with Vectors Limitations standing on airex completed alternaitng toe taps to colored cones x 15 reps, slowly progressing from BUE > single UE  > no UE support. increased challenge without UE support requiring CGA from PT to maintain balance.    Tandem Gait Forward;Upper extremity support;2 reps;Limitations    Tandem Gait Limitations completed x 2 reps down and back in // bars with BUE support.    Retro Gait 3 reps;Limitations    Retro Gait Limitations x 3 laps in // bars with single UE support on blue mat    Sidestepping Foam/compliant support;Limitations    Sidestepping Limitations down and back x 3 laps in // bars with BUE support on blue mat. cues for step length.    Marching Foam/compliant surface;Forwards;Limitations    Marching Limitations x 3 laps in // bars with BUE support on blue mat, cues for improved hip/knee flexion                PT Education  - 07/03/21 1253     Education Details Use of RW long distance and outdoors; Quad tip for [employer]) Educated Patient;Child(ren)    Methods Explanation    Comprehension Verbalized understanding              PT Short Term Goals - 06/03/21 1235  PT SHORT TERM GOAL #1   Title Pt will be independent with initial HEP with daughter's supervision for incr compliance at home. ALL STGS DUE 06/24/21    Time 3    Period Weeks    Status New    Target Date 06/24/21      PT SHORT TERM GOAL #2   Title Pt will perform 5x sit <> stand without UE support in 23 seconds or less in order to demo improved BLE strength.    Baseline 26.38 seconds    Time 3    Period Weeks    Status New               PT Long Term Goals - 06/03/21 1239       PT LONG TERM GOAL #1   Title Pt will be independent with final HEP with daughter's supervision in order to build upon functional gains made in therapy. ALL LTGS DUE 07/10/21    Baseline --    Time 5    Period Weeks    Status Revised    Target Date 07/10/21      PT LONG TERM GOAL #2   Title Pt will improve BERG to at least a 46/56 in order to demo decr fall risk.    Baseline 41/56 on 06/03/21    Time 5    Period Weeks    Status Revised      PT LONG TERM GOAL #3   Title Pt will decr TUG time to 20 seconds or less with SPC in order to demo decr fall risk.    Baseline 24.78 seconds    Time 5    Period Weeks    Status On-going      PT LONG TERM GOAL #4   Title Pt will improve 5x sit <> stand time to 20 seconds or less without UE support in order to demo improved BLE strength and balance.    Baseline 26.84 seconds; 26.38 seconds on 06/02/21    Time 5    Period Weeks    Status On-going      PT LONG TERM GOAL #5   Title --    Baseline --    Time --    Period --    Status --                   Plan - 07/03/21 1254     Clinical Impression Statement Continued gait training w/ various AD including SPC and RW. Improved  stability noted with Rw and PT educating on importance of using device for improved stability. diffiult to use within home due to tight spaces, therefore PT educating on quad tip attachment for SPC. Continued session focused on balance activities with narrow BOS, SLS and on complaint surface. Patient tolerated well. Will continue to progress.    Personal Factors and Comorbidities Time since onset of injury/illness/exacerbation;Past/Current Experience;Comorbidity 3+    Comorbidities DM2, tachy-brady syndrome, skin cancer, s/p CABG 12/04/11, presence of permanent cardiac pacemaker, paroxysmal atrial flutter,  MI, mild vascular neurocognitive disorder, depression, hypothyroisidm, HTN, hiatal hernia, GERD,  CAD, CKD stage 3, CHF, arthritis, and anemia.    Examination-Activity Limitations Stairs;Transfers;Locomotion Level;Bend    Examination-Participation Restrictions Community Activity;Cleaning;Driving    Stability/Clinical Decision Making Evolving/Moderate complexity    Rehab Potential Good    PT Frequency 1x / week    PT Duration 8 weeks   4-5 visits over 8 weeks   PT Treatment/Interventions ADLs/Self Care Home Management;Canalith Repostioning;Functional mobility  training;Stair training;Gait training;Therapeutic activities;Therapeutic exercise;DME Instruction;Balance training;Neuromuscular re-education;Patient/family education;Vestibular    PT Next Visit Plan did we use exercise tracker? check goals + re-cert or d/c. work on BLE strengthening. standing balance -SLS, narrow BOS. progress VOR x1 as appropriate and review. corner balance with eyes closed and head motions.    PT Home Exercise Plan [id number] and seated VOR x1 (horizontal/vertical)    Consulted and Agree with Plan of Care Patient;Family member/caregiver    Family Member Consulted pt's daughter, [name]             Patient will benefit from skilled therapeutic intervention in order to improve the following deficits and impairments:   Abnormal gait, Decreased activity tolerance, Decreased balance, Decreased coordination, Decreased safety awareness, Dizziness, Decreased strength, Difficulty walking, Impaired sensation  Visit Diagnosis: Unsteadiness on feet  Dizziness and giddiness  Other abnormalities of gait and mobility     Problem List Patient Active Problem List   Diagnosis Date Noted   Acute kidney injury superimposed on chronic kidney disease ([place]) 10/03/2020   Nausea & vomiting 10/03/2020   Abdominal pain 10/03/2020   Mild vascular neurocognitive disorder 09/05/2019   Major depressive disorder with anxious distress 09/05/2019   Coag negative Staphylococcus bacteremia 05/21/2019   Acute blood loss anemia 05/21/2019   Anemia of chronic disease 05/21/2019   Anemia in chronic kidney disease 05/21/2019   SSS (sick sinus syndrome) (HCC)    Paroxysmal atrial fibrillation (HCC)    Acute metabolic encephalopathy    Retroperitoneal hematoma    Muscle hemorrhage    S/P placement of cardiac pacemaker    SIRS (systemic inflammatory response syndrome) ([place]) 05/15/2019   Acute encephalopathy 05/15/2019   Status post coronary artery stent placement    CAD S/P percutaneous coronary angioplasty 09/27/2018   Abnormal nuclear stress test    Pain of left shoulder joint on movement 06/16/2018   Closed fracture of upper end of humerus 06/16/2018   Pain in right knee 04/12/2018   Spinal stenosis of lumbar region 02/28/2018   On anticoagulant therapy 04/22/2017   Tachycardia-bradycardia syndrome ([place]) 12/23/2016   Accelerated hypertension    Chronic diastolic CHF (congestive heart failure) ([place]) 08/09/2016   Elevated troponin 08/09/2016   Hypertensive urgency 08/08/2016   Paroxysmal atrial flutter ([place]) 08/08/2016   Coronary artery disease    Hypertensive heart disease    CKD (chronic kidney disease) stage 4, GFR 15-29 ml/min (HCC)    Other specified hypothyroidism    Acute anemia    Pain in the chest    Shortness  of breath 05/23/2016   Pre-syncope 05/23/2016   Chest tightness 09/16/2014   Osteoarthritis of right knee 07/02/2014   Acute medial meniscus tear of right knee 07/02/2014   Diabetic peripheral neuropathy associated with type 1 diabetes mellitus ([place]) 09/05/2013   Edema of extremities 04/18/2013   Cellulitis 04/18/2013   Essential hypertension 01/06/2012   Acute renal failure,admitted 01/03/12, after admission for diastolic chf 10/62/6948   S/P CABG (coronary artery bypass graft), 12/04/11 12/07/2011   Hyperlipidemia LDL goal <70 12/03/2011    Class: Diagnosis of   NSTEMI (non-ST elevated myocardial infarction) ([place]) 12/01/2011   Family history of early CAD 12/01/2011   Insulin dependent diabetes mellitus 12/14/2010   DYSPHAGIA UNSPECIFIED 12/14/2010   ABDOMINAL PAIN, EPIGASTRIC 12/14/2010   ESOPHAGEAL STRICTURE 11/28/2009   GERD 11/28/2009   FLATULENCE-GAS-BLOATING 11/28/2009   CONSTIPATION, CHRONIC 12/19/2007   SALPINGO-OOPHORITIS 12/19/2007   MENOPAUSE, SURGICAL 12/19/2007   BACK PAIN, LUMBAR 12/19/2007   ANKLE  INJURY, RIGHT 12/19/2007   TOTAL KNEE REPLACEMENT, LEFT, HX OF 12/19/2007    Jones Bales, PT, DPT 07/03/2021, 12:57 PM  [facility] [address] [place], [place], [address] Phone: [phone]   Fax:  [phone]  Name: PEIGHTYN ROBERSON MRN: [id number] Date of Birth: 1941/05/24

## 2021-07-06 ENCOUNTER — Ambulatory Visit (INDEPENDENT_AMBULATORY_CARE_PROVIDER_SITE_OTHER): Payer: Medicare Other | Admitting: Cardiology

## 2021-07-06 ENCOUNTER — Other Ambulatory Visit: Payer: Self-pay

## 2021-07-06 ENCOUNTER — Encounter: Payer: Self-pay | Admitting: Cardiology

## 2021-07-06 VITALS — BP 128/60 | HR 60 | Ht 60.0 in | Wt 126.4 lb

## 2021-07-06 DIAGNOSIS — I214 Non-ST elevation (NSTEMI) myocardial infarction: Secondary | ICD-10-CM

## 2021-07-06 NOTE — Progress Notes (Signed)
Electrophysiology Office Note   Date:  07/06/2021   ID:  [name], DOB [birth year], MRN [id number]  PCP:  Leeroy Cha, MD  Cardiologist:  Debara Pickett Primary Electrophysiologist:  Daisean Brodhead Meredith Leeds, MD    No chief complaint on file.    History of Present Illness: [name] is a 80 y.o. female who presents today for electrophysiology evaluation.     She has a history significant for atypical atrial flutter, sinus bradycardia, hypertension, coronary artery disease status post CABG.  She had a TEE cardioversion in October 2018 for atypical atrial flutter.  She was having exercise intolerance and fatigue prior to her cardioversion.  She is now status post [employer]ket dual-chamber pacemaker implanted 08/12/2016.  Today, denies symptoms of palpitations, chest pain, shortness of breath, orthopnea, PND, lower extremity edema, claudication, dizziness, presyncope, syncope, bleeding, or neurologic sequela. The patient is tolerating medications without difficulties.  Since being seen she has done well.  She continues to have intermittent chest discomfort, though this is being followed by her primary cardiologist.  She otherwise has no major complaints and is able to do all her daily activities without restriction.   Past Medical History:  Diagnosis Date   Anemia    Anxiety    Arthritis    "in my hands; knees, back" (09/27/2018)   CAD (coronary artery disease)    a. 40-59% bilaterally 10/2015.   Chronic diastolic CHF (congestive heart failure) ([place])    a. 05/2016 Echo: EF 60-65%, no rwma, Gr1 DD, Ao sclerosis w/o stenosis, triv MR;  b. 07/2016 TEE: EF 55-60%, no rwma, mild MR.   Chronic headaches    CKD (chronic kidney disease), stage III (HCC)    Coronary artery disease    a. 02/2007 Persantine MV: low risk;  b. 11/2011 CABG x 3 (LIMA->LAD, VG->OM, VG->RCA);  c. 05/2016 MV: EF >65%, no isch/infarct, horiz ST dep in I, II, V5-V6.   Depression    Diverticulosis    Esophageal  stricture    GERD (gastroesophageal reflux disease)    Hemorrhoids    Hiatal hernia    Hyperkalemia    a. ARB stopped due to this.   Hyperlipidemia    Hypertension    Hypertensive heart disease    Hypothyroidism    Major depressive disorder with anxious distress 09/05/2019   Mild cognitive impairment    a. seen by neurology.   Mild vascular neurocognitive disorder ([place]) 09/05/2019   Myocardial infarction ([place]) 12/07/2011   PAF (paroxysmal atrial fibrillation) ([place])    a. post-op CABG.   Pain    RIGHT KNEE PAIN - TORN RIGHT MEDIAL MENISCUS   Paroxysmal atrial flutter ([place])    a. 07/2016 s/p TEE & DCCV;  b. 07/2016 Recurrent PAFlutter req initiation of amio & PPM in setting of tachy-brady;  c. CHA2DS2VASc = 7-->Xarelto 15 mg QD.   Pneumonia    "twice" (09/27/2018)   PONV (postoperative nausea and vomiting)    Presence of permanent cardiac pacemaker 08/12/2016   S/P CABG (coronary artery bypass graft), 12/04/11 12/07/2011   LIMA to LAD, SVG to OM, SVG to RCA   Sinus bradycardia    a. not on BB due to this.   Skin cancer    "face" (09/27/2018)   Tachy-brady syndrome ([place])    a. 07/2016 Jxnl brady following DCCV, recurrent Aflutter-->amio + SJM 2272 Assurity MRI DC PPM (ser # [id number]).   Type II diabetes mellitus ([place])    Past Surgical History:  Procedure Laterality  Date   ABDOMINAL HYSTERECTOMY  1980's   ANKLE FRACTURE SURGERY Right    "put pins both side right ankle"   BACK SURGERY  2006   "cyst growing near my spine"   CARDIAC CATHETERIZATION  12/02/2011   mild LV dysfunction with mod hypocontractility of mid-distal anterolateral wall; CAD w/ostial tapering of L [place] with 50% diffuse ostial narrowing of LAD, 99% eccentric focal prox LAD stenosis followed by 70% prox LAD stenosis after 1st diag, 20% mid LAD narrowing; 80% ostial-to-prox L Cfx stenosis & 40-50% irregularity of RCA (Dr. Corky Downs)   CARDIOVERSION N/A 08/11/2016   Procedure: CARDIOVERSION;  Surgeon: Lelon Perla,  MD;  Location: [facility] ENDOSCOPY;  Service: Cardiovascular;  Laterality: N/A;   CATARACT EXTRACTION W/ INTRAOCULAR LENS  IMPLANT, BILATERAL Bilateral ~ 2010   [place] GRAFT  12/04/2011   Procedure: CORONARY ARTERY BYPASS GRAFTING (CABG);  Surgeon: Tharon Aquas Adelene Idler, MD;  Location: [facility];  Service: Open Heart Surgery;  Laterality: N/A;  CABG x three,  using left internal mammary artery, and right leg greater saphenous vein harvested endoscopically   CORONARY STENT INTERVENTION N/A 09/27/2018   Procedure: CORONARY STENT INTERVENTION;  Surgeon: Troy Sine, MD;  Location: [facility] CV LAB;  Service: Cardiovascular;  Laterality: N/A;   DILATION AND CURETTAGE OF UTERUS     "a couple times"   EP IMPLANTABLE DEVICE N/A 08/12/2016   Procedure: Pacemaker Implant;  Surgeon: Esabella Stockinger Meredith Leeds, MD;  Location: [facility] CV LAB;  Service: Cardiovascular;  Laterality: N/A;   ESOPHAGOGASTRODUODENOSCOPY (EGD) WITH ESOPHAGEAL DILATION     FRACTURE SURGERY     JOINT REPLACEMENT     KNEE ARTHROSCOPY WITH MEDIAL MENISECTOMY Right 07/02/2014   Procedure: RIGHT KNEE ARTHROSCOPY WITH PARTIAL MEDIAL MENISTECTOMY, ABRASION CONDROPLASTYU OF PATELLA,ABRASION CONDROPLASTY OF MEDIAL FEMEROL CONDYL, MICROFRACTURE OF MEDIAL FEMEROL CONDYL;  Surgeon: Tobi Bastos, MD;  Location: [facility];  Service: Orthopedics;  Laterality: Right;   LEFT HEART CATH AND CORS/GRAFTS ANGIOGRAPHY N/A 09/06/2018   Procedure: LEFT HEART CATH AND CORS/GRAFTS ANGIOGRAPHY;  Surgeon: Troy Sine, MD;  Location: [facility] CV LAB;  Service: Cardiovascular;  Laterality: N/A;   LEFT HEART CATHETERIZATION WITH CORONARY ANGIOGRAM N/A 12/02/2011   Procedure: LEFT HEART CATHETERIZATION WITH CORONARY ANGIOGRAM;  Surgeon: Troy Sine, MD;  Location: [facility] CATH LAB;  Service: Cardiovascular;  Laterality: N/A;  Coronary angiogram, possible PCI   TEE WITHOUT CARDIOVERSION N/A 08/11/2016   Procedure: TRANSESOPHAGEAL  ECHOCARDIOGRAM (TEE);  Surgeon: Lelon Perla, MD;  Location: [facility] ENDOSCOPY;  Service: Cardiovascular;  Laterality: N/A;   TEE WITHOUT CARDIOVERSION N/A 05/18/2019   Procedure: TRANSESOPHAGEAL ECHOCARDIOGRAM (TEE);  Surgeon: [name], MD;  Location: [facility] ENDOSCOPY;  Service: Cardiovascular;  Laterality: N/A;   [place] Left ~ 2006   TRANSTHORACIC ECHOCARDIOGRAM  02/19/2013   EF 40-97%, grade 1 diastolic dysfunction; mildly thickend/calcified AV leaflets; mildly calcidied MV annulus; mild TR     Current Outpatient Medications  Medication Sig Dispense Refill   acetaminophen (TYLENOL) 650 MG CR tablet Take 650 mg by mouth every 8 (eight) hours as needed for pain.     acetaminophen (TYLENOL) 650 MG CR tablet Take 650 mg by mouth as needed.     allopurinol (ZYLOPRIM) 100 MG tablet Take 100 mg by mouth daily.     aspirin EC 81 MG tablet Take 81 mg by mouth daily.     carvedilol (  COREG) 25 MG tablet Take 12.5 mg by mouth 2 (two) times daily.     diclofenac Sodium (VOLTAREN) 1 % GEL Apply 2 g topically 2 (two) times daily as needed (pain).     docusate sodium (COLACE) 100 MG capsule Take 100 mg by mouth 2 (two) times daily as needed for mild constipation or moderate constipation.     ferrous sulfate 325 (65 FE) MG EC tablet Take 325 mg by mouth daily with breakfast.     gabapentin (NEURONTIN) 300 MG capsule Take 1 capsule (300 mg total) by mouth at bedtime. 30 capsule 2   insulin glargine (LANTUS) 100 UNIT/ML injection Inject 26 Units into the skin daily.     meclizine (ANTIVERT) 25 MG tablet Take 25 mg by mouth daily as needed for dizziness. For dizziness     Multiple Vitamins-Minerals (PRESERVISION AREDS 2 PO) Take 1 tablet by mouth every evening.      ondansetron (ZOFRAN-ODT) 4 MG disintegrating tablet Take 4 mg by mouth daily as needed.     pantoprazole (PROTONIX) 40 MG tablet Take 40 mg by mouth daily.     Polyethyl Glycol-Propyl Glycol (SYSTANE OP)  Place 1 drop into both eyes 2 (two) times daily as needed (dry eyes).     rosuvastatin (CRESTOR) 10 MG tablet Take 10 mg by mouth at bedtime.     sennosides-docusate sodium (SENOKOT-S) 8.6-50 MG tablet daily.     SYNTHROID 100 MCG tablet Take 100 mcg by mouth every morning.     torsemide (DEMADEX) 20 MG tablet Take 20 mg by mouth See admin instructions. Patient alternates between 20 mg and 40 mg every other day     nitroGLYCERIN (NITROSTAT) 0.4 MG SL tablet Place 1 tablet (0.4 mg total) under the tongue every 5 (five) minutes as needed for chest pain (max 3 doses). (Patient not taking: Reported on 12/12/2020) 25 tablet 3   No current facility-administered medications for this visit.    Allergies:   Clonidine derivatives, Mango flavor, Sulfa antibiotics, Sulfa antibiotics, Crestor [rosuvastatin calcium], Epinephrine, Hydralazine, Losartan, and Other   Social History:  The patient  reports that she has never smoked. She has never used smokeless tobacco. She reports that she does not currently use alcohol. She reports that she does not use drugs.   Family History:  The patient's family history includes Breast cancer in her sister, sister, and sister; CVA in her mother; Diabetes in her mother and sister; Heart disease in her brother, father, and sister; Hyperlipidemia in her father; Hypertension in her mother; Lung cancer in her sister.   ROS:  Please see the history of present illness.   Otherwise, review of systems is positive for none.   All other systems are reviewed and negative.   PHYSICAL EXAM: VS:  BP 128/60   Pulse 60   Ht 5' (1.524 m)   Wt 126 lb 6.4 oz (57.3 kg)   SpO2 99%   BMI 24.69 kg/m  , BMI Body mass index is 24.69 kg/m. GEN: Well nourished, well developed, in no acute distress  HEENT: normal  Neck: no JVD, carotid bruits, or masses Cardiac: RRR; no murmurs, rubs, or gallops,no edema  Respiratory:  clear to auscultation bilaterally, normal work of breathing GI: soft,  nontender, nondistended, + BS MS: no deformity or atrophy  Skin: warm and dry, device site well healed Neuro:  Strength and sensation are intact Psych: euthymic mood, full affect  EKG:  EKG is ordered today. Personal review of the ekg  ordered shows atrial paced, rate 60, nonspecific ST/T wave changes  Personal review of the device interrogation today. Results in [place]: 10/05/2020: Magnesium 2.0 11/28/2020: TSH 3.37 02/04/2021: ALT 15; Platelets 143 03/22/2021: BUN 68; Creatinine, Ser 2.30; Hemoglobin 10.5; Potassium 4.5; Sodium 139    Lipid Panel     Component Value Date/Time   CHOL 203 (H) 09/28/2018 0437   TRIG 320 (H) 09/28/2018 0437   HDL 25 (L) 09/28/2018 0437   CHOLHDL 8.1 09/28/2018 0437   VLDL 64 (H) 09/28/2018 0437   LDLCALC 114 (H) 09/28/2018 0437     Wt Readings from Last 3 Encounters:  07/06/21 126 lb 6.4 oz (57.3 kg)  04/09/21 125 lb 6.4 oz (56.9 kg)  03/22/21 123 lb (55.8 kg)      Other studies Reviewed: Additional studies/ records that were reviewed today include: TTE 05/25/16  Review of the above records today demonstrates:  - Left ventricle: The cavity size was normal. Wall thickness was   normal. Systolic function was normal. The estimated ejection   fraction was in the range of 60% to 65%. Wall motion was normal;   there were no regional wall motion abnormalities. Doppler   parameters are consistent with abnormal left ventricular   relaxation (grade 1 diastolic dysfunction). The E/e&' ratio is   between 8-15, suggesting indeterminate LV filling pressure. - Aortic valve: Trileaflet. Sclerosis without stenosis.   Transvalvular velocity was minimally increased. There was no   stenosis. There was no regurgitation. - Mitral valve: Mildly thickened leaflets . There was trivial   regurgitation. - Left atrium: The atrium was normal in size. - Tricuspid valve: There was no significant regurgitation. - Inferior vena cava: The vessel was normal  in size. The   respirophasic diameter changes were in the normal range (>= 50%),   consistent with normal central venous pressure.   ASSESSMENT AND PLAN:  1.  Atypical atrial flutter: Status post cardioversion October 2018 with recurrence of atrial flutter.  Currently on carvedilol 12.5 mg twice daily.  She has had minimal episodes.  We [name] continue with current management.  2.  Tachybradycardia syndrome: Status post [employer] dual-chamber pacemaker implanted 08/12/2016.  Device functioning appropriately.  No changes.  3.  Hypertension: Currently well controlled  4.  Coronary artery disease status post CABG: No current similar symptoms.  Continue with current management.  Plan per primary cardiology.   Current medicines are reviewed at length with the patient today.   The patient does not have concerns regarding her medicines.  The following changes were made today: None  Labs/ tests ordered today include:  Orders Placed This Encounter  Procedures   EKG 12-Lead      Disposition:   FU with Altha Sweitzer 12 months  Signed, Yostin Malacara Meredith Leeds, MD  07/06/2021 12:58 PM     [facility] [address] [place]  [address] [phone] (office) [phone] (fax)

## 2021-07-06 NOTE — Patient Instructions (Addendum)
Medication Instructions:  Your physician recommends that you continue on your current medications as directed. Please refer to the Current Medication list given to you today.  *If you need a refill on your cardiac medications before your next appointment, please call your pharmacy*   Lab Work: None ordered   If you have labs (blood work) drawn today and your tests are completely normal, you will receive your results only by: [facility] (if you have MyChart) OR A paper copy in the mail If you have any lab test that is abnormal or we need to change your treatment, we will call you to review the results.   Testing/Procedures: None ordered   Follow-Up: At [facility], you and your health needs are our priority.  As part of our continuing mission to provide you with exceptional heart care, we have created designated Provider Care Teams.  These Care Teams include your primary Cardiologist (physician) and Advanced Practice Providers (APPs -  Physician Assistants and Nurse Practitioners) who all work together to provide you with the care you need, when you need it.  We recommend signing up for the patient portal called "MyChart".  Sign up information is provided on this After Visit Summary.  MyChart is used to connect with patients for Virtual Visits (Telemedicine).  Patients are able to view lab/test results, encounter notes, upcoming appointments, etc.  Non-urgent messages can be sent to your provider as well.   To learn more about what you can do with MyChart, go to [email or web address].    Your next appointment:   12 month(s)  The format for your next appointment:   In Person  Provider:   You may see Will Meredith Leeds, MD or one of the following Advanced Practice Providers on your designated Care Team:    Other Instructions None

## 2021-07-10 ENCOUNTER — Other Ambulatory Visit: Payer: Self-pay

## 2021-07-10 ENCOUNTER — Ambulatory Visit: Payer: Medicare Other

## 2021-07-10 DIAGNOSIS — E1142 Type 2 diabetes mellitus with diabetic polyneuropathy: Secondary | ICD-10-CM | POA: Diagnosis not present

## 2021-07-10 DIAGNOSIS — R2689 Other abnormalities of gait and mobility: Secondary | ICD-10-CM | POA: Diagnosis not present

## 2021-07-10 DIAGNOSIS — M6281 Muscle weakness (generalized): Secondary | ICD-10-CM | POA: Diagnosis not present

## 2021-07-10 DIAGNOSIS — R2681 Unsteadiness on feet: Secondary | ICD-10-CM

## 2021-07-10 DIAGNOSIS — R42 Dizziness and giddiness: Secondary | ICD-10-CM | POA: Diagnosis not present

## 2021-07-10 DIAGNOSIS — Z9181 History of falling: Secondary | ICD-10-CM | POA: Diagnosis not present

## 2021-07-10 DIAGNOSIS — E1165 Type 2 diabetes mellitus with hyperglycemia: Secondary | ICD-10-CM | POA: Diagnosis not present

## 2021-07-10 DIAGNOSIS — E039 Hypothyroidism, unspecified: Secondary | ICD-10-CM | POA: Diagnosis not present

## 2021-07-10 NOTE — Therapy (Signed)
[facility] [address] [place], [place], [address] Phone: [phone]   Fax:  [phone]  Physical Therapy Treatment/Discharge Summary  Patient Details  Name: [name] MRN: [id number] Date of Birth: 1941-07-24 Referring Provider (PT): Delrae Rend, MD  PHYSICAL THERAPY DISCHARGE SUMMARY  Visits from Start of Care: 7  Current functional level related to goals / functional outcomes: See Clinical Impression   Remaining deficits: Mild imbalance, Dizziness   Education / Equipment: HEP Provided   Patient agrees to discharge. Patient goals were met. Patient is being discharged due to meeting the stated rehab goals.  Encounter Date: 07/10/2021   PT End of Session - 07/10/21 1318     Visit Number 7    Number of Visits 9   per re-cert on 0/3/21   Date for PT Re-Evaluation 08/01/21    Authorization Type [employer] Medicare    PT Start Time 1314    PT Stop Time 1342    PT Time Calculation (min) 28 min    Equipment Utilized During Treatment Gait belt    Activity Tolerance Patient tolerated treatment well    Behavior During Therapy WFL for tasks assessed/performed             Past Medical History:  Diagnosis Date   Anemia    Anxiety    Arthritis    "in my hands; knees, back" (09/27/2018)   CAD (coronary artery disease)    a. 40-59% bilaterally 10/2015.   Chronic diastolic CHF (congestive heart failure) ([place])    a. 05/2016 Echo: EF 60-65%, no rwma, Gr1 DD, Ao sclerosis w/o stenosis, triv MR;  b. 07/2016 TEE: EF 55-60%, no rwma, mild MR.   Chronic headaches    CKD (chronic kidney disease), stage III (HCC)    Coronary artery disease    a. 02/2007 Persantine MV: low risk;  b. 11/2011 CABG x 3 (LIMA->LAD, VG->OM, VG->RCA);  c. 05/2016 MV: EF >65%, no isch/infarct, horiz ST dep in I, II, V5-V6.   Depression    Diverticulosis    Esophageal stricture    GERD (gastroesophageal reflux disease)    Hemorrhoids    Hiatal  hernia    Hyperkalemia    a. ARB stopped due to this.   Hyperlipidemia    Hypertension    Hypertensive heart disease    Hypothyroidism    Major depressive disorder with anxious distress 09/05/2019   Mild cognitive impairment    a. seen by neurology.   Mild vascular neurocognitive disorder ([place]) 09/05/2019   Myocardial infarction ([place]) 12/07/2011   PAF (paroxysmal atrial fibrillation) ([place])    a. post-op CABG.   Pain    RIGHT KNEE PAIN - TORN RIGHT MEDIAL MENISCUS   Paroxysmal atrial flutter ([place])    a. 07/2016 s/p TEE & DCCV;  b. 07/2016 Recurrent PAFlutter req initiation of amio & PPM in setting of tachy-brady;  c. CHA2DS2VASc = 7-->Xarelto 15 mg QD.   Pneumonia    "twice" (09/27/2018)   PONV (postoperative nausea and vomiting)    Presence of permanent cardiac pacemaker 08/12/2016   S/P CABG (coronary artery bypass graft), 12/04/11 12/07/2011   LIMA to LAD, SVG to OM, SVG to RCA   Sinus bradycardia    a. not on BB due to this.   Skin cancer    "face" (09/27/2018)   Tachy-brady syndrome ([place])    a. 07/2016 Jxnl brady following DCCV, recurrent Aflutter-->amio + SJM 2272 Assurity MRI DC PPM (ser # [id number]).  Type II diabetes mellitus ([place])     Past Surgical History:  Procedure Laterality Date   ABDOMINAL HYSTERECTOMY  1980's   ANKLE FRACTURE SURGERY Right    "put pins both side right ankle"   BACK SURGERY  2006   "cyst growing near my spine"   CARDIAC CATHETERIZATION  12/02/2011   mild LV dysfunction with mod hypocontractility of mid-distal anterolateral wall; CAD w/ostial tapering of L [place] with 50% diffuse ostial narrowing of LAD, 99% eccentric focal prox LAD stenosis followed by 70% prox LAD stenosis after 1st diag, 20% mid LAD narrowing; 80% ostial-to-prox L Cfx stenosis & 40-50% irregularity of RCA (Dr. Corky Downs)   CARDIOVERSION N/A 08/11/2016   Procedure: CARDIOVERSION;  Surgeon: Lelon Perla, MD;  Location: [facility];  Service: Cardiovascular;  Laterality: N/A;    CATARACT EXTRACTION W/ INTRAOCULAR LENS  IMPLANT, BILATERAL Bilateral ~ 2010   [place] GRAFT  12/04/2011   Procedure: CORONARY ARTERY BYPASS GRAFTING (CABG);  Surgeon: Tharon Aquas Adelene Idler, MD;  Location: [facility];  Service: Open Heart Surgery;  Laterality: N/A;  CABG x three,  using left internal mammary artery, and right leg greater saphenous vein harvested endoscopically   CORONARY STENT INTERVENTION N/A 09/27/2018   Procedure: CORONARY STENT INTERVENTION;  Surgeon: Troy Sine, MD;  Location: [facility] CV LAB;  Service: Cardiovascular;  Laterality: N/A;   DILATION AND CURETTAGE OF UTERUS     "a couple times"   EP IMPLANTABLE DEVICE N/A 08/12/2016   Procedure: Pacemaker Implant;  Surgeon: Will Meredith Leeds, MD;  Location: [facility] CV [facility];  Service: Cardiovascular;  Laterality: N/A;   ESOPHAGOGASTRODUODENOSCOPY (EGD) WITH ESOPHAGEAL DILATION     FRACTURE SURGERY     JOINT REPLACEMENT     KNEE ARTHROSCOPY WITH MEDIAL MENISECTOMY Right 07/02/2014   Procedure: RIGHT KNEE ARTHROSCOPY WITH PARTIAL MEDIAL MENISTECTOMY, ABRASION CONDROPLASTYU OF PATELLA,ABRASION CONDROPLASTY OF MEDIAL FEMEROL CONDYL, MICROFRACTURE OF MEDIAL FEMEROL CONDYL;  Surgeon: Tobi Bastos, MD;  Location: [facility];  Service: Orthopedics;  Laterality: Right;   LEFT HEART CATH AND CORS/GRAFTS ANGIOGRAPHY N/A 09/06/2018   Procedure: LEFT HEART CATH AND CORS/GRAFTS ANGIOGRAPHY;  Surgeon: Troy Sine, MD;  Location: [facility] CV LAB;  Service: Cardiovascular;  Laterality: N/A;   LEFT HEART CATHETERIZATION WITH CORONARY ANGIOGRAM N/A 12/02/2011   Procedure: LEFT HEART CATHETERIZATION WITH CORONARY ANGIOGRAM;  Surgeon: Troy Sine, MD;  Location: [facility] CATH LAB;  Service: Cardiovascular;  Laterality: N/A;  Coronary angiogram, possible PCI   TEE WITHOUT CARDIOVERSION N/A 08/11/2016   Procedure: TRANSESOPHAGEAL ECHOCARDIOGRAM (TEE);  Surgeon: Lelon Perla, MD;  Location: [facility] ENDOSCOPY;   Service: Cardiovascular;  Laterality: N/A;   TEE WITHOUT CARDIOVERSION N/A 05/18/2019   Procedure: TRANSESOPHAGEAL ECHOCARDIOGRAM (TEE);  Surgeon: [name], MD;  Location: [facility] ENDOSCOPY;  Service: Cardiovascular;  Laterality: N/A;   [place] Left ~ 2006   TRANSTHORACIC ECHOCARDIOGRAM  02/19/2013   EF 35-00%, grade 1 diastolic dysfunction; mildly thickend/calcified AV leaflets; mildly calcidied MV annulus; mild TR    There were no vitals filed for this visit.   Subjective Assessment - 07/10/21 1317     Subjective No new changes. walking in with [facility] today. No pain or falls to report.    Patient is accompained by: Family member   pt's daughter [name]   Pertinent History DM2, tachy-brady syndrome, skin cancer, s/p CABG 12/04/11, presence of permanent cardiac pacemaker, paroxysmal atrial  flutter,  MI, mild vascular neurocognitive disorder, depression, hypothyroisidm, HTN, hiatal hernia, GERD,  CAD, CKD stage 3, CHF, arthritis, and anemia.    Limitations Walking    Patient Stated Goals wants to learn to walk better.    Currently in Pain? No/denies               [facility] Adult PT Treatment/Exercise - 07/10/21 0001       Transfers   Transfers Sit to Stand;Stand to Sit    Sit to Stand 5: Supervision    Five time sit to stand comments  16.82 seconds seconds without UE support from standard height chair    Stand to Sit 5: Supervision      Ambulation/Gait   Ambulation/Gait Yes    Ambulation/Gait Assistance 5: Supervision    Ambulation/Gait Assistance Details Supervision into/out of therapy session with SPC.    Assistive device Straight cane    Gait Pattern Step-through pattern;Decreased step length - right;Decreased step length - left;Decreased arm swing - left;Decreased dorsiflexion - right;Decreased dorsiflexion - left    Ambulation Surface Level;Indoor    Gait Comments PT continued education on continued use of AD due to imbalance.       Standardized Balance Assessment   Standardized Balance Assessment Berg Balance Test;Timed Up and Go Test      Berg Balance Test   Sit to Stand Able to stand without using hands and stabilize independently    Standing Unsupported Able to stand safely 2 minutes    Sitting with Back Unsupported but Feet Supported on Floor or Stool Able to sit safely and securely 2 minutes    Stand to Sit Sits safely with minimal use of hands    Transfers Able to transfer safely, minor use of hands    Standing Unsupported with Eyes Closed Able to stand 10 seconds safely    Standing Ubsupported with Feet Together Able to place feet together independently and stand for 1 minute with supervision    From Standing, Reach Forward with Outstretched Arm Can reach forward >12 cm safely (5")   8"   From Standing Position, Pick up Object from Floor Able to pick up shoe, needs supervision    From Standing Position, Turn to Look Behind Over each Shoulder Looks behind from both sides and weight shifts well    Turn 360 Degrees Able to turn 360 degrees safely but slowly    Standing Unsupported, Alternately Place Feet on Step/Stool Able to complete 4 steps without aid or supervision    Standing Unsupported, One Foot in Front Able to take small step independently and hold 30 seconds    Standing on One Leg Tries to lift leg/unable to hold 3 seconds but remains standing independently    Total Score 44      Timed Up and Go Test   TUG Normal TUG    Normal TUG (seconds) 14.38   with SPC            HEP Review:   Access Code: [email or web address] URL: [email or web address] Date: 07/10/2021 Prepared by: Baldomero Lamy  Exercises Seated Long Arc Quad - 2 x daily - 7 x weekly - 1 sets - 10 reps Seated Heel Raise - 2 x daily - 7 x weekly - 1 sets - 10 reps Seated March - 2 x daily - 7 x weekly - 1 sets - 10 reps Sit to Stand with Armchair - 2 x daily - 7 x weekly - 3 sets - 5 reps  PT Education - 07/10/21 1345      Education Details HEP Review and Compliance. Progress toward LTGs. Continued use of AD at this time    Person(s) Educated Patient;Child(ren)    Methods Explanation;Demonstration    Comprehension Verbalized understanding              PT Short Term Goals - 06/03/21 1235       PT SHORT TERM GOAL #1   Title Pt will be independent with initial HEP with daughter's supervision for incr compliance at home. ALL STGS DUE 06/24/21    Time 3    Period Weeks    Status New    Target Date 06/24/21      PT SHORT TERM GOAL #2   Title Pt will perform 5x sit <> stand without UE support in 23 seconds or less in order to demo improved BLE strength.    Baseline 26.38 seconds    Time 3    Period Weeks    Status New               PT Long Term Goals - 07/10/21 1322       PT LONG TERM GOAL #1   Title Pt will be independent with final HEP with daughter's supervision in order to build upon functional gains made in therapy. ALL LTGS DUE 07/10/21    Baseline reports understanding of HEP reports completing daily    Time 5    Period Weeks    Status Achieved      PT LONG TERM GOAL #2   Title Pt will improve BERG to at least a 46/56 in order to demo decr fall risk.    Baseline 41/56 on 06/03/21; 44/56    Time 5    Period Weeks    Status Not Met      PT LONG TERM GOAL #3   Title Pt will decr TUG time to 20 seconds or less with SPC in order to demo decr fall risk.    Baseline 24.78 seconds; 14.38 seconds    Time 5    Period Weeks    Status Achieved      PT LONG TERM GOAL #4   Title Pt will improve 5x sit <> stand time to 20 seconds or less without UE support in order to demo improved BLE strength and balance.    Baseline 26.84 seconds; 26.38 seconds on 06/02/21; 16.82 seconds without UE support    Time 5    Period Weeks    Status Achieved                   Plan - 07/10/21 1347     Clinical Impression Statement Today's skilled PT session included assesment of patient's  progress toward all LTGs. Patient able to meet LTG #1, 3, and 4. Patient demonstrating improved 5x sit <> stand to 16.82 seconds and TUG to 14.38 seconds with SPC. With Berg balance made progress to 44/56 shy of meeting LTG. Patient has made progress with PT services despite inconsistency of appointments over POC. Patient and daugther feeling comfortable with d/c at this time, with PT agreeable.    Personal Factors and Comorbidities Time since onset of injury/illness/exacerbation;Past/Current Experience;Comorbidity 3+    Comorbidities DM2, tachy-brady syndrome, skin cancer, s/p CABG 12/04/11, presence of permanent cardiac pacemaker, paroxysmal atrial flutter,  MI, mild vascular neurocognitive disorder, depression, hypothyroisidm, HTN, hiatal hernia, GERD,  CAD, CKD stage 3, CHF, arthritis, and anemia.    Examination-Activity Limitations Stairs;Transfers;Locomotion  Level;Bend    Examination-Participation Restrictions Community Activity;Cleaning;Driving    Stability/Clinical Decision Making Evolving/Moderate complexity    Rehab Potential Good    PT Frequency 1x / week    PT Duration 8 weeks   4-5 visits over 8 weeks   PT Treatment/Interventions ADLs/Self Care Home Management;Canalith Repostioning;Functional mobility training;Stair training;Gait training;Therapeutic activities;Therapeutic exercise;DME Instruction;Balance training;Neuromuscular re-education;Patient/family education;Vestibular    PT Home Exercise Plan [email or web address] and seated VOR x1 (horizontal/vertical)    Consulted and Agree with Plan of Care Patient;Family member/caregiver    Family Member Consulted pt's daughter, [name]             Patient will benefit from skilled therapeutic intervention in order to improve the following deficits and impairments:  Abnormal gait, Decreased activity tolerance, Decreased balance, Decreased coordination, Decreased safety awareness, Dizziness, Decreased strength, Difficulty walking, Impaired  sensation  Visit Diagnosis: Unsteadiness on feet  Dizziness and giddiness  Other abnormalities of gait and mobility  Muscle weakness (generalized)  History of falling     Problem List Patient Active Problem List   Diagnosis Date Noted   Acute kidney injury superimposed on chronic kidney disease ([place]) 10/03/2020   Nausea & vomiting 10/03/2020   Abdominal pain 10/03/2020   Mild vascular neurocognitive disorder 09/05/2019   Major depressive disorder with anxious distress 09/05/2019   Coag negative Staphylococcus bacteremia 05/21/2019   Acute blood loss anemia 05/21/2019   Anemia of chronic disease 05/21/2019   Anemia in chronic kidney disease 05/21/2019   SSS (sick sinus syndrome) (HCC)    Paroxysmal atrial fibrillation (HCC)    Acute metabolic encephalopathy    Retroperitoneal hematoma    Muscle hemorrhage    S/P placement of cardiac pacemaker    SIRS (systemic inflammatory response syndrome) ([place]) 05/15/2019   Acute encephalopathy 05/15/2019   Status post coronary artery stent placement    CAD S/P percutaneous coronary angioplasty 09/27/2018   Abnormal nuclear stress test    Pain of left shoulder joint on movement 06/16/2018   Closed fracture of upper end of humerus 06/16/2018   Pain in right knee 04/12/2018   Spinal stenosis of lumbar region 02/28/2018   On anticoagulant therapy 04/22/2017   Tachycardia-bradycardia syndrome ([place]) 12/23/2016   Accelerated hypertension    Chronic diastolic CHF (congestive heart failure) ([place]) 08/09/2016   Elevated troponin 08/09/2016   Hypertensive urgency 08/08/2016   Paroxysmal atrial flutter ([place]) 08/08/2016   Coronary artery disease    Hypertensive heart disease    CKD (chronic kidney disease) stage 4, GFR 15-29 ml/min (HCC)    Other specified hypothyroidism    Acute anemia    Pain in the chest    Shortness of breath 05/23/2016   Pre-syncope 05/23/2016   Chest tightness 09/16/2014   Osteoarthritis of right knee 07/02/2014    Acute medial meniscus tear of right knee 07/02/2014   Diabetic peripheral neuropathy associated with type 1 diabetes mellitus ([place]) 09/05/2013   Edema of extremities 04/18/2013   Cellulitis 04/18/2013   Essential hypertension 01/06/2012   Acute renal failure,admitted 01/03/12, after admission for diastolic chf 20/25/4270   S/P CABG (coronary artery bypass graft), 12/04/11 12/07/2011   Hyperlipidemia LDL goal <70 12/03/2011    Class: Diagnosis of   NSTEMI (non-ST elevated myocardial infarction) ([place]) 12/01/2011   Family history of early CAD 12/01/2011   Insulin dependent diabetes mellitus 12/14/2010   DYSPHAGIA UNSPECIFIED 12/14/2010   ABDOMINAL PAIN, EPIGASTRIC 12/14/2010   ESOPHAGEAL STRICTURE 11/28/2009   GERD 11/28/2009   FLATULENCE-GAS-BLOATING 11/28/2009   CONSTIPATION,  CHRONIC 12/19/2007   SALPINGO-OOPHORITIS 12/19/2007   MENOPAUSE, SURGICAL 12/19/2007   BACK PAIN, LUMBAR 12/19/2007   ANKLE INJURY, RIGHT 12/19/2007   TOTAL KNEE REPLACEMENT, LEFT, HX OF 12/19/2007    Jones Bales, PT, DPT 07/10/2021, 1:50 PM  [facility] [address] [place], [place], [address] Phone: [phone]   Fax:  [phone]  Name: GRACELYNN BIRCHER MRN: [id number] Date of Birth: [birth year]

## 2021-07-15 DIAGNOSIS — E875 Hyperkalemia: Secondary | ICD-10-CM | POA: Diagnosis not present

## 2021-07-15 DIAGNOSIS — I129 Hypertensive chronic kidney disease with stage 1 through stage 4 chronic kidney disease, or unspecified chronic kidney disease: Secondary | ICD-10-CM | POA: Diagnosis not present

## 2021-07-15 DIAGNOSIS — D631 Anemia in chronic kidney disease: Secondary | ICD-10-CM | POA: Diagnosis not present

## 2021-07-15 DIAGNOSIS — N189 Chronic kidney disease, unspecified: Secondary | ICD-10-CM | POA: Diagnosis not present

## 2021-07-15 DIAGNOSIS — N183 Chronic kidney disease, stage 3 unspecified: Secondary | ICD-10-CM | POA: Diagnosis not present

## 2021-08-10 ENCOUNTER — Emergency Department (HOSPITAL_COMMUNITY): Payer: Medicare Other

## 2021-08-10 ENCOUNTER — Observation Stay (HOSPITAL_COMMUNITY)
Admission: EM | Admit: 2021-08-10 | Discharge: 2021-08-11 | Disposition: A | Discharge status: Home / Self Care | Payer: Medicare Other | Attending: Internal Medicine | Admitting: Internal Medicine

## 2021-08-10 DIAGNOSIS — Z951 Presence of aortocoronary bypass graft: Secondary | ICD-10-CM

## 2021-08-10 DIAGNOSIS — Z96652 Presence of left artificial knee joint: Secondary | ICD-10-CM | POA: Diagnosis not present

## 2021-08-10 DIAGNOSIS — E1122 Type 2 diabetes mellitus with diabetic chronic kidney disease: Secondary | ICD-10-CM | POA: Insufficient documentation

## 2021-08-10 DIAGNOSIS — R079 Chest pain, unspecified: Secondary | ICD-10-CM | POA: Diagnosis not present

## 2021-08-10 DIAGNOSIS — E1042 Type 1 diabetes mellitus with diabetic polyneuropathy: Secondary | ICD-10-CM

## 2021-08-10 DIAGNOSIS — Z794 Long term (current) use of insulin: Secondary | ICD-10-CM

## 2021-08-10 DIAGNOSIS — Z79899 Other long term (current) drug therapy: Secondary | ICD-10-CM | POA: Diagnosis not present

## 2021-08-10 DIAGNOSIS — I10 Essential (primary) hypertension: Secondary | ICD-10-CM

## 2021-08-10 DIAGNOSIS — R0789 Other chest pain: Principal | ICD-10-CM | POA: Insufficient documentation

## 2021-08-10 DIAGNOSIS — E039 Hypothyroidism, unspecified: Secondary | ICD-10-CM | POA: Diagnosis present

## 2021-08-10 DIAGNOSIS — E1159 Type 2 diabetes mellitus with other circulatory complications: Secondary | ICD-10-CM | POA: Diagnosis present

## 2021-08-10 DIAGNOSIS — E1169 Type 2 diabetes mellitus with other specified complication: Secondary | ICD-10-CM | POA: Diagnosis present

## 2021-08-10 DIAGNOSIS — I2510 Atherosclerotic heart disease of native coronary artery without angina pectoris: Secondary | ICD-10-CM | POA: Diagnosis not present

## 2021-08-10 DIAGNOSIS — U071 COVID-19: Secondary | ICD-10-CM | POA: Insufficient documentation

## 2021-08-10 DIAGNOSIS — R778 Other specified abnormalities of plasma proteins: Secondary | ICD-10-CM

## 2021-08-10 DIAGNOSIS — R739 Hyperglycemia, unspecified: Secondary | ICD-10-CM | POA: Diagnosis not present

## 2021-08-10 DIAGNOSIS — I480 Paroxysmal atrial fibrillation: Secondary | ICD-10-CM | POA: Insufficient documentation

## 2021-08-10 DIAGNOSIS — I200 Unstable angina: Secondary | ICD-10-CM

## 2021-08-10 DIAGNOSIS — I130 Hypertensive heart and chronic kidney disease with heart failure and stage 1 through stage 4 chronic kidney disease, or unspecified chronic kidney disease: Secondary | ICD-10-CM | POA: Insufficient documentation

## 2021-08-10 DIAGNOSIS — I5032 Chronic diastolic (congestive) heart failure: Secondary | ICD-10-CM | POA: Diagnosis not present

## 2021-08-10 DIAGNOSIS — I214 Non-ST elevation (NSTEMI) myocardial infarction: Secondary | ICD-10-CM | POA: Diagnosis not present

## 2021-08-10 DIAGNOSIS — E119 Type 2 diabetes mellitus without complications: Secondary | ICD-10-CM

## 2021-08-10 DIAGNOSIS — N183 Chronic kidney disease, stage 3 unspecified: Secondary | ICD-10-CM | POA: Insufficient documentation

## 2021-08-10 DIAGNOSIS — I152 Hypertension secondary to endocrine disorders: Secondary | ICD-10-CM | POA: Diagnosis present

## 2021-08-10 DIAGNOSIS — R6889 Other general symptoms and signs: Secondary | ICD-10-CM | POA: Diagnosis not present

## 2021-08-10 DIAGNOSIS — Z950 Presence of cardiac pacemaker: Secondary | ICD-10-CM

## 2021-08-10 DIAGNOSIS — I4892 Unspecified atrial flutter: Secondary | ICD-10-CM | POA: Diagnosis present

## 2021-08-10 DIAGNOSIS — Z7982 Long term (current) use of aspirin: Secondary | ICD-10-CM | POA: Insufficient documentation

## 2021-08-10 DIAGNOSIS — Z743 Need for continuous supervision: Secondary | ICD-10-CM | POA: Diagnosis not present

## 2021-08-10 LAB — COMPREHENSIVE METABOLIC PANEL
ALT: 17 U/L (ref 0–44)
AST: 22 U/L (ref 15–41)
Albumin: 3.7 g/dL (ref 3.5–5.0)
Alkaline Phosphatase: 73 U/L (ref 38–126)
Anion gap: 10 (ref 5–15)
BUN: 37 mg/dL — ABNORMAL HIGH (ref 8–23)
CO2: 21 mmol/L — ABNORMAL LOW (ref 22–32)
Calcium: 9.1 mg/dL (ref 8.9–10.3)
Chloride: 106 mmol/L (ref 98–111)
Creatinine, Ser: 1.61 mg/dL — ABNORMAL HIGH (ref 0.44–1.00)
GFR, Estimated: 32 mL/min — ABNORMAL LOW (ref >60)
Glucose, Bld: 231 mg/dL — ABNORMAL HIGH (ref 70–99)
Potassium: 4.5 mmol/L (ref 3.5–5.1)
Sodium: 137 mmol/L (ref 135–145)
Total Bilirubin: 0.8 mg/dL (ref 0.3–1.2)
Total Protein: 6.8 g/dL (ref 6.5–8.1)

## 2021-08-10 LAB — URINALYSIS, ROUTINE W REFLEX MICROSCOPIC
Bacteria, UA: NONE SEEN
Bilirubin Urine: NEGATIVE
Glucose, UA: NEGATIVE mg/dL
Ketones, ur: NEGATIVE mg/dL
Nitrite: NEGATIVE
Protein, ur: >=300 mg/dL — AB
Specific Gravity, Urine: 1.017 (ref 1.005–1.030)
pH: 6 (ref 5.0–8.0)

## 2021-08-10 LAB — CBC
HCT: 33.1 % — ABNORMAL LOW (ref 36.0–46.0)
Hemoglobin: 11.2 g/dL — ABNORMAL LOW (ref 12.0–15.0)
MCH: 34 pg (ref 26.0–34.0)
MCHC: 33.8 g/dL (ref 30.0–36.0)
MCV: 100.6 fL — ABNORMAL HIGH (ref 80.0–100.0)
Platelets: 147 10*3/uL — ABNORMAL LOW (ref 150–400)
RBC: 3.29 MIL/uL — ABNORMAL LOW (ref 3.87–5.11)
RDW: 13.3 % (ref 11.5–15.5)
WBC: 11.1 10*3/uL — ABNORMAL HIGH (ref 4.0–10.5)
nRBC: 0 % (ref 0.0–0.2)

## 2021-08-10 LAB — CBG MONITORING, ED: Glucose-Capillary: 149 mg/dL — ABNORMAL HIGH (ref 70–99)

## 2021-08-10 LAB — RESP PANEL BY RT-PCR (FLU A&B, COVID) ARPGX2
Influenza A by PCR: NEGATIVE
Influenza B by PCR: NEGATIVE
SARS Coronavirus 2 by RT PCR: POSITIVE — AB

## 2021-08-10 LAB — TROPONIN I (HIGH SENSITIVITY)
Troponin I (High Sensitivity): 63 ng/L — ABNORMAL HIGH (ref <18)
Troponin I (High Sensitivity): 77 ng/L — ABNORMAL HIGH (ref <18)

## 2021-08-10 LAB — LIPASE, BLOOD: Lipase: 61 U/L — ABNORMAL HIGH (ref 11–51)

## 2021-08-10 MED ORDER — GABAPENTIN 300 MG PO CAPS
300.0000 mg | ORAL_CAPSULE | Freq: Every day | ORAL | Status: DC
  Administered 2021-08-10: 300 mg via ORAL
  Filled 2021-08-10: qty 1

## 2021-08-10 MED ORDER — AMLODIPINE BESYLATE 5 MG PO TABS
5.0000 mg | ORAL_TABLET | Freq: Every day | ORAL | Status: DC
  Administered 2021-08-10 – 2021-08-11 (×2): 5 mg via ORAL
  Filled 2021-08-10 (×2): qty 1

## 2021-08-10 MED ORDER — INSULIN GLARGINE-YFGN 100 UNIT/ML ~~LOC~~ SOLN
12.0000 [IU] | Freq: Once | SUBCUTANEOUS | Status: AC
  Administered 2021-08-10: 12 [IU] via SUBCUTANEOUS
  Filled 2021-08-10: qty 0.12

## 2021-08-10 MED ORDER — ASPIRIN EC 81 MG PO TBEC
81.0000 mg | DELAYED_RELEASE_TABLET | Freq: Every day | ORAL | Status: DC
  Filled 2021-08-10: qty 1

## 2021-08-10 MED ORDER — LEVOTHYROXINE SODIUM 100 MCG PO TABS
100.0000 ug | ORAL_TABLET | Freq: Every morning | ORAL | Status: DC
  Administered 2021-08-11: 100 ug via ORAL
  Filled 2021-08-10: qty 1

## 2021-08-10 MED ORDER — FERROUS SULFATE 325 (65 FE) MG PO TABS
325.0000 mg | ORAL_TABLET | Freq: Every day | ORAL | Status: DC
  Administered 2021-08-11: 325 mg via ORAL
  Filled 2021-08-10: qty 1

## 2021-08-10 MED ORDER — INSULIN ASPART 100 UNIT/ML IJ SOLN
0.0000 [IU] | Freq: Three times a day (TID) | INTRAMUSCULAR | Status: DC
  Administered 2021-08-11: 2 [IU] via SUBCUTANEOUS

## 2021-08-10 MED ORDER — ISOSORBIDE MONONITRATE ER 30 MG PO TB24
30.0000 mg | ORAL_TABLET | Freq: Every day | ORAL | Status: DC
  Administered 2021-08-10 – 2021-08-11 (×2): 30 mg via ORAL
  Filled 2021-08-10 (×2): qty 1

## 2021-08-10 MED ORDER — ASPIRIN EC 81 MG PO TBEC
81.0000 mg | DELAYED_RELEASE_TABLET | Freq: Every day | ORAL | Status: DC
  Administered 2021-08-11: 81 mg via ORAL
  Filled 2021-08-10: qty 1

## 2021-08-10 MED ORDER — HEPARIN SODIUM (PORCINE) 5000 UNIT/ML IJ SOLN
5000.0000 [IU] | Freq: Three times a day (TID) | INTRAMUSCULAR | Status: DC
  Administered 2021-08-10 – 2021-08-11 (×3): 5000 [IU] via SUBCUTANEOUS
  Filled 2021-08-10 (×3): qty 1

## 2021-08-10 MED ORDER — ACETAMINOPHEN 325 MG PO TABS: 650.0000 mg | ORAL_TABLET | Freq: Four times a day (QID) | ORAL | Status: DC | PRN

## 2021-08-10 MED ORDER — TORSEMIDE 20 MG PO TABS: 20.0000 mg | ORAL_TABLET | ORAL | Status: DC

## 2021-08-10 MED ORDER — SODIUM CHLORIDE 0.9% FLUSH
3.0000 mL | Freq: Two times a day (BID) | INTRAVENOUS | Status: DC
  Administered 2021-08-10 – 2021-08-11 (×2): 3 mL via INTRAVENOUS

## 2021-08-10 MED ORDER — ONDANSETRON HCL 4 MG/2ML IJ SOLN
4.0000 mg | Freq: Four times a day (QID) | INTRAMUSCULAR | Status: DC | PRN
  Administered 2021-08-10: 4 mg via INTRAVENOUS
  Filled 2021-08-10: qty 2

## 2021-08-10 MED ORDER — INSULIN GLARGINE-YFGN 100 UNIT/ML ~~LOC~~ SOLN
26.0000 [IU] | Freq: Every day | SUBCUTANEOUS | Status: DC
  Filled 2021-08-10: qty 0.26

## 2021-08-10 MED ORDER — TORSEMIDE 20 MG PO TABS
20.0000 mg | ORAL_TABLET | ORAL | Status: DC
  Administered 2021-08-10: 20 mg via ORAL
  Filled 2021-08-10: qty 1

## 2021-08-10 MED ORDER — PANTOPRAZOLE SODIUM 40 MG PO TBEC
40.0000 mg | DELAYED_RELEASE_TABLET | Freq: Every day | ORAL | Status: DC
  Administered 2021-08-11: 40 mg via ORAL
  Filled 2021-08-10: qty 1

## 2021-08-10 MED ORDER — SENNOSIDES-DOCUSATE SODIUM 8.6-50 MG PO TABS: 1.0000 | ORAL_TABLET | Freq: Every evening | ORAL | Status: DC | PRN

## 2021-08-10 MED ORDER — DOCUSATE SODIUM 100 MG PO CAPS: 100.0000 mg | ORAL_CAPSULE | Freq: Two times a day (BID) | ORAL | Status: DC | PRN

## 2021-08-10 MED ORDER — ALBUTEROL SULFATE HFA 108 (90 BASE) MCG/ACT IN AERS: 2.0000 | INHALATION_SPRAY | Freq: Four times a day (QID) | RESPIRATORY_TRACT | Status: DC | PRN

## 2021-08-10 MED ORDER — ROSUVASTATIN CALCIUM 5 MG PO TABS
10.0000 mg | ORAL_TABLET | Freq: Every day | ORAL | Status: DC
  Administered 2021-08-10: 10 mg via ORAL
  Filled 2021-08-10: qty 2

## 2021-08-10 MED ORDER — GUAIFENESIN-DM 100-10 MG/5ML PO SYRP: 10.0000 mL | ORAL_SOLUTION | ORAL | Status: DC | PRN

## 2021-08-10 MED ORDER — ONDANSETRON HCL 4 MG PO TABS: 4.0000 mg | ORAL_TABLET | Freq: Four times a day (QID) | ORAL | Status: DC | PRN

## 2021-08-10 MED ORDER — ACETAMINOPHEN 650 MG RE SUPP: 650.0000 mg | Freq: Four times a day (QID) | RECTAL | Status: DC | PRN

## 2021-08-10 MED ORDER — CARVEDILOL 12.5 MG PO TABS: 12.5000 mg | ORAL_TABLET | Freq: Two times a day (BID) | ORAL | Status: DC

## 2021-08-10 MED ORDER — LABETALOL HCL 5 MG/ML IV SOLN
10.0000 mg | INTRAVENOUS | Status: DC | PRN
  Administered 2021-08-10: 10 mg via INTRAVENOUS
  Filled 2021-08-10: qty 4

## 2021-08-10 MED ORDER — CARVEDILOL 3.125 MG PO TABS
12.5000 mg | ORAL_TABLET | Freq: Two times a day (BID) | ORAL | Status: DC
  Administered 2021-08-11: 12.5 mg via ORAL
  Filled 2021-08-10: qty 4

## 2021-08-10 MED ORDER — INSULIN GLARGINE 100 UNIT/ML SOLOSTAR PEN: 26.0000 [IU] | PEN_INJECTOR | Freq: Every morning | SUBCUTANEOUS | Status: DC

## 2021-08-10 NOTE — ED Notes (Signed)
Pt had 1 episode of emesis. Given 4mg  zofran per prn order.

## 2021-08-10 NOTE — ED Notes (Signed)
Posey Pronto, MD made aware of pts elevated BP readings.

## 2021-08-10 NOTE — ED Triage Notes (Signed)
IV 20g rt. Forearm., Zofran 4mg  IV. ASA 324mg .

## 2021-08-10 NOTE — Consult Note (Signed)
CARDIOLOGY CONSULT NOTE       Patient ID: [name] MRN: [id number] DOB/AGE: December 14, 1940 80 y.o.  Admit date: 08/10/2021 Referring Physician: Eulis Foster Primary Physician: Leeroy Cha, MD Primary Cardiologist: [facility] Reason for Consultation: Chest pain  Active Problems:   * No active hospital problems. *   HPI:  80 y.o. with significant dementia lives alone. History of CABG with LIMA to LAD SVG OM SVT to RCA in 2013 09/06/18 she had stenting of the SVG to OM She has had on/off chest pain for years. Friday she had SSCP sharp and took nitro with some relief. She forgot to take her BP meds over weekend and when daughter called she complained of some more pains in her chest and brought to ED with elevated BP No pain currently She is hard of hearing Most of history from daughter who is a Marine scientist at [employer]. Mom lives independently but doesn't do much. In ER troponin seems chronically elevated 63-77 but was 102 and 110 10 months ago with no acute event. ECG A pacing nonspecific ST changes nothing acute She has a Research officer, political party PPM for tachy brady and PaF not on anticoagulation ? Due to age and dementia. She is anemic with CRF and low PLTls   Labs Hct 33.1 BUN 37 Cr 1.6 baseline seems about 2  Daughter indicates health care power of attorney and would prefer DNR  ROS All other systems reviewed and negative except as noted above  Past Medical History:  Diagnosis Date   Anemia    Anxiety    Arthritis    "in my hands; knees, back" (09/27/2018)   CAD (coronary artery disease)    a. 40-59% bilaterally 10/2015.   Chronic diastolic CHF (congestive heart failure) ([place])    a. 05/2016 Echo: EF 60-65%, no rwma, Gr1 DD, Ao sclerosis w/o stenosis, triv MR;  b. 07/2016 TEE: EF 55-60%, no rwma, mild MR.   Chronic headaches    CKD (chronic kidney disease), stage III (HCC)    Coronary artery disease    a. 02/2007 Persantine MV: low risk;  b. 11/2011 CABG x 3 (LIMA->LAD, VG->OM, VG->RCA);  c. 05/2016 MV:  EF >65%, no isch/infarct, horiz ST dep in I, II, V5-V6.   Depression    Diverticulosis    Esophageal stricture    GERD (gastroesophageal reflux disease)    Hemorrhoids    Hiatal hernia    Hyperkalemia    a. ARB stopped due to this.   Hyperlipidemia    Hypertension    Hypertensive heart disease    Hypothyroidism    Major depressive disorder with anxious distress 09/05/2019   Mild cognitive impairment    a. seen by neurology.   Mild vascular neurocognitive disorder ([place]) 09/05/2019   Myocardial infarction ([place]) 12/07/2011   PAF (paroxysmal atrial fibrillation) ([place])    a. post-op CABG.   Pain    RIGHT KNEE PAIN - TORN RIGHT MEDIAL MENISCUS   Paroxysmal atrial flutter ([place])    a. 07/2016 s/p TEE & DCCV;  b. 07/2016 Recurrent PAFlutter req initiation of amio & PPM in setting of tachy-brady;  c. CHA2DS2VASc = 7-->Xarelto 15 mg QD.   Pneumonia    "twice" (09/27/2018)   PONV (postoperative nausea and vomiting)    Presence of permanent cardiac pacemaker 08/12/2016   S/P CABG (coronary artery bypass graft), 12/04/11 12/07/2011   LIMA to LAD, SVG to OM, SVG to RCA   Sinus bradycardia    a. not on BB due to this.  Skin cancer    "face" (09/27/2018)   Tachy-brady syndrome ([place])    a. 07/2016 Jxnl brady following DCCV, recurrent Aflutter-->amio + SJM 2272 Assurity MRI DC PPM (ser # [id number]).   Type II diabetes mellitus ([place])     Family History  Problem Relation Age of Onset   Diabetes Mother    CVA Mother    Hypertension Mother    Heart disease Father    Hyperlipidemia Father    Breast cancer Sister    Breast cancer Sister        x 3   Heart disease Brother        x5; one with MI   Heart disease Sister        x3   Diabetes Sister        x3   Lung cancer Sister    Breast cancer Sister    Colon cancer Neg Hx     Social History   Socioeconomic History   Marital status: Widowed    Spouse name: Not on file   Number of children: 2   Years of education: 12   Highest  education level: High school graduate  Occupational History   Occupation: retired  Tobacco Use   Smoking status: Never   Smokeless tobacco: Never  Scientific laboratory technician Use: Never used  Substance and Sexual Activity   Alcohol use: Not Currently    Comment: very occasional    Drug use: Never   Sexual activity: Not Currently  Other Topics Concern   Not on file  Social History Narrative   2 children, 1 daughter and 1 adopted son   Social Determinants of Radio broadcast assistant Strain: Not on file  Food Insecurity: Not on file  Transportation Needs: Not on file  Physical Activity: Not on file  Stress: Not on file  Social Connections: Not on file  Intimate Partner Violence: Not on file    Past Surgical History:  Procedure Laterality Date   ABDOMINAL HYSTERECTOMY  1980's   ANKLE FRACTURE SURGERY Right    "put pins both side right ankle"   BACK SURGERY  2006   "cyst growing near my spine"   CARDIAC CATHETERIZATION  12/02/2011   mild LV dysfunction with mod hypocontractility of mid-distal anterolateral wall; CAD w/ostial tapering of L Main with 50% diffuse ostial narrowing of LAD, 99% eccentric focal prox LAD stenosis followed by 70% prox LAD stenosis after 1st diag, 20% mid LAD narrowing; 80% ostial-to-prox L Cfx stenosis & 40-50% irregularity of RCA (Dr. Corky Downs)   CARDIOVERSION N/A 08/11/2016   Procedure: CARDIOVERSION;  Surgeon: Lelon Perla, MD;  Location: [facility];  Service: Cardiovascular;  Laterality: N/A;   CATARACT EXTRACTION W/ INTRAOCULAR LENS  IMPLANT, BILATERAL Bilateral ~ 2010   [place] GRAFT  12/04/2011   Procedure: CORONARY ARTERY BYPASS GRAFTING (CABG);  Surgeon: Tharon Aquas Adelene Idler, MD;  Location: [facility];  Service: Open Heart Surgery;  Laterality: N/A;  CABG x three,  using left internal mammary artery, and right leg greater saphenous vein harvested endoscopically   CORONARY STENT INTERVENTION N/A 09/27/2018    Procedure: CORONARY STENT INTERVENTION;  Surgeon: Troy Sine, MD;  Location: [facility];  Service: Cardiovascular;  Laterality: N/A;   DILATION AND CURETTAGE OF UTERUS     "a couple times"   EP IMPLANTABLE DEVICE N/A 08/12/2016   Procedure: Pacemaker Implant;  Surgeon: Will Meredith Leeds, MD;  Location: [facility];  Service: Cardiovascular;  Laterality: N/A;   ESOPHAGOGASTRODUODENOSCOPY (EGD) WITH ESOPHAGEAL DILATION     FRACTURE SURGERY     JOINT REPLACEMENT     KNEE ARTHROSCOPY WITH MEDIAL MENISECTOMY Right 07/02/2014   Procedure: RIGHT KNEE ARTHROSCOPY WITH PARTIAL MEDIAL MENISTECTOMY, ABRASION CONDROPLASTYU OF PATELLA,ABRASION CONDROPLASTY OF MEDIAL FEMEROL CONDYL, MICROFRACTURE OF MEDIAL FEMEROL CONDYL;  Surgeon: Tobi Bastos, MD;  Location: [facility];  Service: Orthopedics;  Laterality: Right;   LEFT HEART CATH AND CORS/GRAFTS ANGIOGRAPHY N/A 09/06/2018   Procedure: LEFT HEART CATH AND CORS/GRAFTS ANGIOGRAPHY;  Surgeon: Troy Sine, MD;  Location: [facility];  Service: Cardiovascular;  Laterality: N/A;   LEFT HEART CATHETERIZATION WITH CORONARY ANGIOGRAM N/A 12/02/2011   Procedure: LEFT HEART CATHETERIZATION WITH CORONARY ANGIOGRAM;  Surgeon: Troy Sine, MD;  Location: [facility] C[facility];  Service: Cardiovascular;  Laterality: N/A;  Coronary angiogram, possible PCI   TEE WITHOUT CARDIOVERSION N/A 08/11/2016   Procedure: TRANSESOPHAGEAL ECHOCARDIOGRAM (TEE);  Surgeon: Lelon Perla, MD;  Location: [facility];  Service: Cardiovascular;  Laterality: N/A;   TEE WITHOUT CARDIOVERSION N/A 05/18/2019   Procedure: TRANSESOPHAGEAL ECHOCARDIOGRAM (TEE);  Surgeon: [name], MD;  Location: [facility] ENDOSCOPY;  Service: Cardiovascular;  Laterality: N/A;   [place] Left ~ 2006   TRANSTHORACIC ECHOCARDIOGRAM  02/19/2013   EF 17-61%, grade 1 diastolic dysfunction; mildly thickend/calcified AV leaflets; mildly calcidied MV annulus; mild  TR      Current Facility-Administered Medications:    [START ON 08/11/2021] aspirin EC tablet 81 mg, 81 mg, Oral, Daily, Heloise Purpura, RPH   carvedilol (COREG) tablet 12.5 mg, 12.5 mg, Oral, BID, Daleen Bo, MD   Derrill Memo ON 08/12/2021] torsemide (DEMADEX) tablet 20 mg, 20 mg, Oral, QODAY, Heloise Purpura, RPH  Current Outpatient Medications:    acetaminophen (TYLENOL) 650 MG CR tablet, Take 650 mg by mouth every 8 (eight) hours as needed for pain., Disp: , Rfl:    allopurinol (ZYLOPRIM) 100 MG tablet, Take 100 mg by mouth daily., Disp: , Rfl:    aspirin EC 81 MG tablet, Take 81 mg by mouth daily., Disp: , Rfl:    carvedilol (COREG) 25 MG tablet, Take 12.5 mg by mouth 2 (two) times daily., Disp: , Rfl:    diclofenac Sodium (VOLTAREN) 1 % GEL, Apply 2 g topically 2 (two) times daily as needed (pain)., Disp: , Rfl:    docusate sodium (COLACE) 100 MG capsule, Take 100 mg by mouth 2 (two) times daily as needed for mild constipation or moderate constipation., Disp: , Rfl:    FEROSUL 325 (65 Fe) MG tablet, Take 325 mg by mouth daily., Disp: , Rfl:    gabapentin (NEURONTIN) 300 MG capsule, Take 1 capsule (300 mg total) by mouth at bedtime., Disp: 30 capsule, Rfl: 2   LANTUS SOLOSTAR 100 UNIT/ML Solostar Pen, Inject 26 Units into the skin in the morning., Disp: , Rfl:    meclizine (ANTIVERT) 25 MG tablet, Take 25 mg by mouth daily as needed for dizziness. For dizziness, Disp: , Rfl:    nitroGLYCERIN (NITROSTAT) 0.4 MG SL tablet, Place 0.4 mg under the tongue every 5 (five) minutes as needed for chest pain., Disp: , Rfl:    ondansetron (ZOFRAN-ODT) 4 MG disintegrating tablet, Take 4 mg by mouth every 8 (eight) hours as needed for nausea or vomiting., Disp: , Rfl:    pantoprazole (PROTONIX) 40 MG tablet, Take 40 mg by mouth  daily., Disp: , Rfl:    Polyethyl Glycol-Propyl Glycol (SYSTANE OP), Place 1 drop into both eyes 2 (two) times daily as needed (dry eyes)., Disp: , Rfl:    rosuvastatin  (CRESTOR) 10 MG tablet, Take 10 mg by mouth at bedtime., Disp: , Rfl:    SYNTHROID 100 MCG tablet, Take 100 mcg by mouth every morning., Disp: , Rfl:    torsemide (DEMADEX) 20 MG tablet, Take 20 mg by mouth every other day., Disp: , Rfl:    nitroGLYCERIN (NITROSTAT) 0.4 MG SL tablet, Place 1 tablet (0.4 mg total) under the tongue every 5 (five) minutes as needed for chest pain (max 3 doses)., Disp: 25 tablet, Rfl: 3  [START ON 08/11/2021] aspirin EC  81 mg Oral Daily   carvedilol  12.5 mg Oral BID   [START ON 08/12/2021] torsemide  20 mg Oral QODAY     Physical Exam: Blood pressure (!) 185/57, pulse 66, temperature 98.1 F (36.7 C), temperature source Oral, resp. rate (!) 21, SpO2 98 %.    Frail elderly female Dementia Hard of hearing  Clear lungs No murmur  Abdomen soft No edema Palpable DP bilaterally   Labs:   Lab Results  Component Value Date   WBC 11.1 (H) 08/10/2021   HGB 11.2 (L) 08/10/2021   HCT 33.1 (L) 08/10/2021   MCV 100.6 (H) 08/10/2021   PLT 147 (L) 08/10/2021    Recent Labs  Lab 08/10/21 1156  NA 137  K 4.5  CL 106  CO2 21*  BUN 37*  CREATININE 1.61*  CALCIUM 9.1  PROT 6.8  BILITOT 0.8  ALKPHOS 73  ALT 17  AST 22  GLUCOSE 231*   Lab Results  Component Value Date   CKTOTAL 103 12/02/2011   CKMB 4.7 (H) 12/02/2011   TROPONINI 0.10 (HH) 08/15/2018    Lab Results  Component Value Date   CHOL 203 (H) 09/28/2018   CHOL 330 (H) 08/08/2016   CHOL 254 (H) 12/02/2011   Lab Results  Component Value Date   HDL 25 (L) 09/28/2018   HDL 42 08/08/2016   HDL 36 (L) 12/02/2011   Lab Results  Component Value Date   LDLCALC 114 (H) 09/28/2018   LDLCALC 225 (H) 08/08/2016   LDLCALC 143 (H) 12/02/2011   Lab Results  Component Value Date   TRIG 320 (H) 09/28/2018   TRIG 313 (H) 08/08/2016   TRIG 375 (H) 12/02/2011   Lab Results  Component Value Date   CHOLHDL 8.1 09/28/2018   CHOLHDL 7.9 08/08/2016   CHOLHDL 7.1 12/02/2011   No results  found for: LDLDIRECT    Radiology: DG Chest 2 View  Result Date: 08/10/2021 CLINICAL DATA:  Chest pain. EXAM: CHEST - 2 VIEW COMPARISON:  02/04/2021 FINDINGS: Stable appearance of the cardiac pacemaker. Heart size is upper limits of normal and stable. Enlarged interstitial lung markings bilaterally and findings are suggestive for interstitial pulmonary edema. Cannot exclude trace pleural fluid. Trachea is midline. Chronic deformity of the proximal left humerus. 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 Findings are suggestive for interstitial pulmonary edema. Electronically Signed   By: Markus Daft M.D.   On: 08/10/2021 12:26    EKG: see HPI   ASSESSMENT AND PLAN:   Chest Pain:  hard to know what to make of it since daughter gives most of history CABG in 2013 with stent to SVG OM in 2019 Myovue done 01/14/20 abnormal with EF 47% medium defect in apical location  with mild peri infarct ischemia ? Diaphragmatic attenuation as well. Chronically elevated troponin in setting of renal failure. No acute ECG changes. Given her age, dementia, atypical pain , anemia, CRF and low PLTls would suggest IM /hospitalist admission for observation and no aggressive cardiac testing such as cath or stress testing add nitrates and continue beta blocker  HTN:  has not had meds since Friday resume  Thyroid:  on synthroid replacement check TSH PPM:  normal function   Signed: Jenkins Rouge 08/10/2021, 6:33 PM

## 2021-08-10 NOTE — ED Provider Notes (Signed)
Emergency Medicine Provider Triage Evaluation Note  [name] , a 80 y.o. female  was evaluated in triage.  Pt complains of significant chest pain relieved by nitroglycerin yesterday, as well as nausea, shortness of breath.  Patient reports that chest pain resolved after nitroglycerin and did not return, but she was short of breath this morning when she awoke. Patient denies diaphoresis, radiation of chest pain.  Patient reports that she more or less feels like herself at this time, other than some ongoing nausea, however patient's daughter wanted her to be evaluated since she was having chest pain yesterday.  Patient does have a history of prior heart attack, stents in her heart.  Patient also endorses some diarrhea, no vomiting, no dysuria.  Review of Systems  Positive: As above  Negative: As above  Physical Exam  BP (!) 219/53 (BP Location: Left Arm)   Pulse 62   Temp 98.1 F (36.7 C) (Oral)   Resp 16   SpO2 97%  Gen:   Awake, no distress   Resp:  Normal effort  MSK:   Moves extremities without difficulty  Other:  Some generalized / epigastric TTP of abdomen  Medical Decision Making  Medically screening exam initiated at 11:50 AM.  Appropriate orders placed.  [name] was informed that the remainder of the evaluation will be completed by another provider, this initial triage assessment does not replace that evaluation, and the importance of remaining in the ED until their evaluation is complete.  Chest pain, nausea   Dorien Chihuahua 08/10/21 1153    Daleen Bo, MD 08/10/21 2024

## 2021-08-10 NOTE — H&P (Addendum)
History and Physical    [name] TDD:[id number] DOB: 12-16-1940 DOA: 08/10/2021  PCP: Leeroy Cha, MD  Patient coming from: Home via EMS  I have personally briefly reviewed patient's old medical records in [place]  Chief Complaint: Chest pain  HPI: [name] is a 80 y.o. female with medical history significant for CAD s/p CABG with residual severe disease s/p PCI SVG to OM 08/2018, paroxysmal atrial flutter no longer on anticoagulation, tachybradycardia syndrome s/p PPM, chronic diastolic CHF (last EF 62-37% in 2020), CKD stage IIIb, insulin-dependent T2DM, HTN, HLD, hypothyroidism, and mild cognitive impairment who presents to the ED for evaluation of chest pain.  Patient reports substernal heavy chest pressure/pain initially occurring on 10/15 while at rest.  This is similar to the pain she experiences on and off for years.  She did not notice any radiation of her pain to her arms, neck, jaw, back.  She had associated nausea without emesis.  She also felt lightheaded.  She is also felt some upper abdominal discomfort.  She had recurrent pain in similar fashion yesterday.  She took nitroglycerin with relief of her pain.  She has had some shortness of breath without cough.  She denies any diarrhea, dysuria, lower extremity edema.  Patient is currently on aspirin alone.  Daughter states that Xarelto was stopped sometime after her last PCI.  She does experience occasional nosebleeds, last one occurring about 1 month ago.  She has not had any other obvious bleeding.  She currently lives alone.  She uses a [employer] when out of the house but is able to get around the house on her own.  ED Course:  Initial vitals showed BP 219/53, pulse 62, RR 16, temp 98.1 F, SPO2 97% on room air.  Labs show WBC 11.1, hemoglobin 11.2, platelets 147,000, sodium 137, potassium 4.5, bicarb 21, BUN 37, creatinine 1.61, serum glucose 231, LFTs within normal limits, lipase 61.   High-sensitivity troponin 63 > 77.  Urinalysis negative for UTI.  2 view chest x-ray shows prior CABG changes, PPM in place left chest wall, increased interstitial lung markings suggestive of pulmonary edema.  Patient was given 12 units of insulin Semglee.  Patient was given oral torsemide 20 mg.  EDP consulted cardiology who and started patient on Imdur, Coreg, amlodipine.  Medical admission was recommended and the hospitalist service was consulted to admit for further evaluation and management.  Review of Systems:  All systems reviewed and are negative except as documented in history of present illness above.   Past Medical History:  Diagnosis Date   Anemia    Anxiety    Arthritis    "in my hands; knees, back" (09/27/2018)   CAD (coronary artery disease)    a. 40-59% bilaterally 10/2015.   Chronic diastolic CHF (congestive heart failure) ([place])    a. 05/2016 Echo: EF 60-65%, no rwma, Gr1 DD, Ao sclerosis w/o stenosis, triv MR;  b. 07/2016 TEE: EF 55-60%, no rwma, mild MR.   Chronic headaches    CKD (chronic kidney disease), stage III (HCC)    Coronary artery disease    a. 02/2007 Persantine MV: low risk;  b. 11/2011 CABG x 3 (LIMA->LAD, VG->OM, VG->RCA);  c. 05/2016 MV: EF >65%, no isch/infarct, horiz ST dep in I, II, V5-V6.   Depression    Diverticulosis    Esophageal stricture    GERD (gastroesophageal reflux disease)    Hemorrhoids    Hiatal hernia    Hyperkalemia  a. ARB stopped due to this.   Hyperlipidemia    Hypertension    Hypertensive heart disease    Hypothyroidism    Major depressive disorder with anxious distress 09/05/2019   Mild cognitive impairment    a. seen by neurology.   Mild vascular neurocognitive disorder ([place]) 09/05/2019   Myocardial infarction ([place]) 12/07/2011   PAF (paroxysmal atrial fibrillation) ([place])    a. post-op CABG.   Pain    RIGHT KNEE PAIN - TORN RIGHT MEDIAL MENISCUS   Paroxysmal atrial flutter ([place])    a. 07/2016 s/p TEE & DCCV;  b.  07/2016 Recurrent PAFlutter req initiation of amio & PPM in setting of tachy-brady;  c. CHA2DS2VASc = 7-->Xarelto 15 mg QD.   Pneumonia    "twice" (09/27/2018)   PONV (postoperative nausea and vomiting)    Presence of permanent cardiac pacemaker 08/12/2016   S/P CABG (coronary artery bypass graft), 12/04/11 12/07/2011   LIMA to LAD, SVG to OM, SVG to RCA   Sinus bradycardia    a. not on BB due to this.   Skin cancer    "face" (09/27/2018)   Tachy-brady syndrome ([place])    a. 07/2016 Jxnl brady following DCCV, recurrent Aflutter-->amio + SJM 2272 Assurity MRI DC PPM (ser # [id number]).   Type II diabetes mellitus ([place])     Past Surgical History:  Procedure Laterality Date   ABDOMINAL HYSTERECTOMY  1980's   ANKLE FRACTURE SURGERY Right    "put pins both side right ankle"   BACK SURGERY  2006   "cyst growing near my spine"   CARDIAC CATHETERIZATION  12/02/2011   mild LV dysfunction with mod hypocontractility of mid-distal anterolateral wall; CAD w/ostial tapering of L [place] with 50% diffuse ostial narrowing of LAD, 99% eccentric focal prox LAD stenosis followed by 70% prox LAD stenosis after 1st diag, 20% mid LAD narrowing; 80% ostial-to-prox L Cfx stenosis & 40-50% irregularity of RCA (Dr. Corky Downs)   CARDIOVERSION N/A 08/11/2016   Procedure: CARDIOVERSION;  Surgeon: Lelon Perla, MD;  Location: [facility];  Service: Cardiovascular;  Laterality: N/A;   CATARACT EXTRACTION W/ INTRAOCULAR LENS  IMPLANT, BILATERAL Bilateral ~ 2010   [place] GRAFT  12/04/2011   Procedure: CORONARY ARTERY BYPASS GRAFTING (CABG);  Surgeon: Tharon Aquas Adelene Idler, MD;  Location: [facility];  Service: Open Heart Surgery;  Laterality: N/A;  CABG x three,  using left internal mammary artery, and right leg greater saphenous vein harvested endoscopically   CORONARY STENT INTERVENTION N/A 09/27/2018   Procedure: CORONARY STENT INTERVENTION;  Surgeon: Troy Sine, MD;  Location: [facility]  CV LAB;  Service: Cardiovascular;  Laterality: N/A;   DILATION AND CURETTAGE OF UTERUS     "a couple times"   EP IMPLANTABLE DEVICE N/A 08/12/2016   Procedure: Pacemaker Implant;  Surgeon: Will Meredith Leeds, MD;  Location: [facility] [facility];  Service: Cardiovascular;  Laterality: N/A;   ESOPHAGOGASTRODUODENOSCOPY (EGD) WITH ESOPHAGEAL DILATION     FRACTURE SURGERY     JOINT REPLACEMENT     KNEE ARTHROSCOPY WITH MEDIAL MENISECTOMY Right 07/02/2014   Procedure: RIGHT KNEE ARTHROSCOPY WITH PARTIAL MEDIAL MENISTECTOMY, ABRASION CONDROPLASTYU OF PATELLA,ABRASION CONDROPLASTY OF MEDIAL FEMEROL CONDYL, MICROFRACTURE OF MEDIAL FEMEROL CONDYL;  Surgeon: Tobi Bastos, MD;  Location: [facility];  Service: Orthopedics;  Laterality: Right;   LEFT HEART CATH AND CORS/GRAFTS ANGIOGRAPHY N/A 09/06/2018   Procedure: LEFT HEART CATH AND CORS/GRAFTS ANGIOGRAPHY;  Surgeon: Shelva Majestic  A, MD;  Location: [facility];  Service: Cardiovascular;  Laterality: N/A;   LEFT HEART CATHETERIZATION WITH CORONARY ANGIOGRAM N/A 12/02/2011   Procedure: LEFT HEART CATHETERIZATION WITH CORONARY ANGIOGRAM;  Surgeon: Troy Sine, MD;  Location: [facility] CATH LAB;  Service: Cardiovascular;  Laterality: N/A;  Coronary angiogram, possible PCI   TEE WITHOUT CARDIOVERSION N/A 08/11/2016   Procedure: TRANSESOPHAGEAL ECHOCARDIOGRAM (TEE);  Surgeon: Lelon Perla, MD;  Location: [facility] ENDOSCOPY;  Service: Cardiovascular;  Laterality: N/A;   TEE WITHOUT CARDIOVERSION N/A 05/18/2019   Procedure: TRANSESOPHAGEAL ECHOCARDIOGRAM (TEE);  Surgeon: [name], MD;  Location: [facility] ENDOSCOPY;  Service: Cardiovascular;  Laterality: N/A;   [place] Left ~ 2006   TRANSTHORACIC ECHOCARDIOGRAM  02/19/2013   EF 40-98%, grade 1 diastolic dysfunction; mildly thickend/calcified AV leaflets; mildly calcidied MV annulus; mild TR    Social History:  reports that she has never smoked. She has never used smokeless  tobacco. She reports that she does not currently use alcohol. She reports that she does not use drugs.  Allergies  Allergen Reactions   Clonidine Derivatives Other (See Comments)    Bradycardia and fatigue    Mango Flavor Hives   Sulfa Antibiotics Other (See Comments)    Unknown   Sulfa Antibiotics    Crestor [Rosuvastatin Calcium] Other (See Comments)    Other reaction(s): tired and weak   Epinephrine Other (See Comments)    Abnormal feeling. Dental exam/injection of local w/ epi.   Hydralazine Other (See Comments)    Nausea/gi upset    Losartan Other (See Comments)    Hyperkalemia    Other Other (See Comments)    MANGO'S - WELTS ALL OVER    Family History  Problem Relation Age of Onset   Diabetes Mother    CVA Mother    Hypertension Mother    Heart disease Father    Hyperlipidemia Father    Breast cancer Sister    Breast cancer Sister        x 3   Heart disease Brother        x5; one with MI   Heart disease Sister        x3   Diabetes Sister        x3   Lung cancer Sister    Breast cancer Sister    Colon cancer Neg Hx      Prior to Admission medications   Medication Sig Start Date End Date Taking? Authorizing Provider  acetaminophen (TYLENOL) 650 MG CR tablet Take 650 mg by mouth every 8 (eight) hours as needed for pain.   Yes [provider]  allopurinol (ZYLOPRIM) 100 MG tablet Take 100 mg by mouth daily.   Yes [provider]  aspirin EC 81 MG tablet Take 81 mg by mouth daily.   Yes [provider]  carvedilol (COREG) 25 MG tablet Take 12.5 mg by mouth 2 (two) times daily.   Yes [provider]  diclofenac Sodium (VOLTAREN) 1 % GEL Apply 2 g topically 2 (two) times daily as needed (pain).   Yes [provider]  docusate sodium (COLACE) 100 MG capsule Take 100 mg by mouth 2 (two) times daily as needed for mild constipation or moderate constipation.   Yes [provider]  FEROSUL 325 (65 Fe) MG tablet Take  325 mg by mouth daily. 04/21/21  Yes [provider]  gabapentin (NEURONTIN) 300 MG capsule Take 1 capsule (300 mg  total) by mouth at bedtime. 05/22/19  Yes Roney Jaffe, MD  LANTUS SOLOSTAR 100 UNIT/ML Solostar Pen Inject 26 Units into the skin in the morning. 05/06/21  Yes [provider]  meclizine (ANTIVERT) 25 MG tablet Take 25 mg by mouth daily as needed for dizziness. For dizziness   Yes [provider]  nitroGLYCERIN (NITROSTAT) 0.4 MG SL tablet Place 0.4 mg under the tongue every 5 (five) minutes as needed for chest pain.   Yes [provider]  ondansetron (ZOFRAN-ODT) 4 MG disintegrating tablet Take 4 mg by mouth every 8 (eight) hours as needed for nausea or vomiting. 12/05/20  Yes [provider]  pantoprazole (PROTONIX) 40 MG tablet Take 40 mg by mouth daily. 06/24/20  Yes [provider]  Polyethyl Glycol-Propyl Glycol (SYSTANE OP) Place 1 drop into both eyes 2 (two) times daily as needed (dry eyes).   Yes [provider]  rosuvastatin (CRESTOR) 10 MG tablet Take 10 mg by mouth at bedtime. 01/28/21  Yes [provider]  SYNTHROID 100 MCG tablet Take 100 mcg by mouth every morning. 03/26/21  Yes [provider]  torsemide (DEMADEX) 20 MG tablet Take 20 mg by mouth every other day. 10/24/20  Yes [provider]  nitroGLYCERIN (NITROSTAT) 0.4 MG SL tablet Place 1 tablet (0.4 mg total) under the tongue every 5 (five) minutes as needed for chest pain (max 3 doses). 02/11/20 05/11/20  [name], MD    Physical Exam: Vitals:   08/10/21 1715 08/10/21 1730 08/10/21 1800 08/10/21 1830  BP: (!) 183/62 (!) 169/61 (!) 185/57 (!) 146/93  Pulse: 62 66 66 63  Resp: (!) 24 16 (!) 21 (!) 22  Temp:      TempSrc:      SpO2: 98% 98% 98% 96%   Constitutional: Elderly woman resting supine in bed, NAD, calm, comfortable Eyes: PERRL, lids and conjunctivae normal ENMT: Mucous membranes are moist. Posterior pharynx  clear of any exudate or lesions.Normal dentition.  Neck: normal, supple, no masses. Respiratory: clear to auscultation bilaterally, no wheezing, no crackles. Normal respiratory effort. No accessory muscle use.  Cardiovascular: Regular rate and rhythm, no murmurs / rubs / gallops. No extremity edema. 2+ pedal pulses.  PPM in place left upper chest wall. Abdomen: no tenderness, no masses palpated. No hepatosplenomegaly. Bowel sounds positive.  Musculoskeletal: no clubbing / cyanosis. No joint deformity upper and lower extremities. Good ROM, no contractures. Normal muscle tone.  Skin: no rashes, lesions, ulcers. No induration Neurologic: CN 2-12 grossly intact. Sensation intact. Strength 5/5 in all 4.  Psychiatric: Alert and oriented x to self, place, situation.  Takes several guesses to get the current year.  Exhibits some short-term memory issues.   Labs on Admission: I have personally reviewed following labs and imaging studies  CBC: Recent Labs  Lab 08/10/21 1156  WBC 11.1*  HGB 11.2*  HCT 33.1*  MCV 100.6*  PLT 161*   Basic Metabolic Panel: Recent Labs  Lab 08/10/21 1156  NA 137  K 4.5  CL 106  CO2 21*  GLUCOSE 231*  BUN 37*  CREATININE 1.61*  CALCIUM 9.1   GFR: CrCl cannot be calculated (Unknown ideal weight.). Liver Function Tests: Recent Labs  Lab 08/10/21 1156  AST 22  ALT 17  ALKPHOS 73  BILITOT 0.8  PROT 6.8  ALBUMIN 3.7   Recent Labs  Lab 08/10/21 1156  LIPASE 61*   No results for input(s): AMMONIA in the last 168 hours. Coagulation Profile: No  results for input(s): INR, PROTIME in the last 168 hours. Cardiac Enzymes: No results for input(s): CKTOTAL, CKMB, CKMBINDEX, TROPONINI in the last 168 hours. BNP (last 3 results) No results for input(s): PROBNP in the last 8760 hours. HbA1C: No results for input(s): HGBA1C in the last 72 hours. CBG: No results for input(s): GLUCAP in the last 168 hours. Lipid Profile: No results for input(s): CHOL,  HDL, LDLCALC, TRIG, CHOLHDL, LDLDIRECT in the last 72 hours. Thyroid Function Tests: No results for input(s): TSH, T4TOTAL, FREET4, T3FREE, THYROIDAB in the last 72 hours. Anemia Panel: No results for input(s): VITAMINB12, FOLATE, FERRITIN, TIBC, IRON, RETICCTPCT in the last 72 hours. Urine analysis:    Component Value Date/Time   COLORURINE YELLOW 08/10/2021 1151   APPEARANCEUR CLEAR 08/10/2021 1151   LABSPEC 1.017 08/10/2021 1151   PHURINE 6.0 08/10/2021 1151   GLUCOSEU NEGATIVE 08/10/2021 1151   HGBUR SMALL (A) 08/10/2021 1151   BILIRUBINUR NEGATIVE 08/10/2021 1151   KETONESUR NEGATIVE 08/10/2021 1151   PROTEINUR >=300 (A) 08/10/2021 1151   UROBILINOGEN 0.2 01/04/2012 1105   NITRITE NEGATIVE 08/10/2021 1151   LEUKOCYTESUR SMALL (A) 08/10/2021 1151    Radiological Exams on Admission: DG Chest 2 View  Result Date: 08/10/2021 CLINICAL DATA:  Chest pain. EXAM: CHEST - 2 VIEW COMPARISON:  02/04/2021 FINDINGS: Stable appearance of the cardiac pacemaker. Heart size is upper limits of normal and stable. Enlarged interstitial lung markings bilaterally and findings are suggestive for interstitial pulmonary edema. Cannot exclude trace pleural fluid. Trachea is midline. Chronic deformity of the proximal left humerus. 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 Findings are suggestive for interstitial pulmonary edema. Electronically Signed   By: Markus Daft M.D.   On: 08/10/2021 12:26    EKG: Personally reviewed.  Atrial paced rhythm with long AV conduction, not significantly changed when compared to prior.  Assessment/Plan Principal Problem:   Chest pain Active Problems:   Insulin dependent type 2 diabetes mellitus (HCC)   Hyperlipidemia associated with type 2 diabetes mellitus ([place])   S/P CABG (coronary artery bypass graft), 12/04/11   Hypertension associated with diabetes ([place])   Hypothyroidism   Paroxysmal atrial flutter (HCC)   CKD (chronic kidney  disease), stage III ([place])   [name] is a 80 y.o. female with medical history significant for CAD s/p CABG with residual severe disease s/p PCI SVG to OM 08/2018, paroxysmal atrial flutter no longer on anticoagulation, tachybradycardia syndrome s/p PPM, chronic diastolic CHF (last EF 52-84% in 2020), CKD stage IIIb, insulin-dependent T2DM, HTN, HLD, hypothyroidism, and mild cognitive impairment who is admitted with unstable angina.  Chest pain/unstable angina CAD s/p CABG with residual severe multivessel disease s/p PCI SVG to OM: History consistent with unstable angina.  Does have minimal troponin elevation which is less than previous baseline.  Cardiology following.  Medical management recommended without further ischemic work-up. -Started on Imdur 30 mg daily and amlodipine 5 mg daily per cardiology -Continue Coreg 12.5 mg twice daily -Continue aspirin 81 mg -Repeat echocardiogram per cardiology  Paroxysmal atrial flutter Tachybradycardia syndrome s/p PPM: Stable with paced rhythm.  Off anticoagulation for at least 2 years now per patient's daughter.  Continue Coreg and aspirin.  Chronic diastolic CHF: Last EF 13-24% in 2020.  Appears euvolemic. -Continue home torsemide 20 mg every other day -Follow echocardiogram  Insulin-dependent type 2 diabetes: Resume home Lantus 26 units every morning, add sensitive SSI.  CKD stage IIIb: Stable, renal function improved from previous baseline.  Hypertension:  BP elevated on admission.  Started on Imdur, amlodipine, continuing Coreg and torsemide.  Anemia/thrombocytopenia: Borderline low hemoglobin and platelets, improved from baseline, without active bleeding.  Hypothyroidism: Continue Synthroid.  Hyperlipidemia: Continue rosuvastatin.  Mild cognitive impairment: Chronic, appears stable.  No sign of delirium on admission.  ADDENDUM: COVID-19 test has returned positive.  May be facilitating her anginal symptoms but otherwise no  significant respiratory symptoms, no evidence of pneumonia on CXR, and currently saturating well on room air.  Will continue supportive care at this time and place on precautions.  DVT prophylaxis: Subcutaneous heparin Code Status: DNR, confirmed on admission Family Communication: Discussed with patient's daughter at bedside Disposition Plan: From home and likely discharge to home pending clinical progress Consults called: Cardiology Level of care: Telemetry Cardiac Admission status:  Status is: Observation  The patient remains OBS appropriate and will d/c before 2 midnights.   Zada Finders MD [facility]  If 7PM-7AM, please contact night-coverage [email or web address]  08/10/2021, 7:03 PM

## 2021-08-10 NOTE — ED Triage Notes (Signed)
EMS stated, she started having CP yesterday and talk to her daughter to take the [facility] she did made her nauseated so she refused to take it today. Pain stays n her chest non radiating.

## 2021-08-10 NOTE — ED Notes (Signed)
Eulis Foster, MD aware of pts BP.

## 2021-08-10 NOTE — ED Provider Notes (Signed)
[facility] EMERGENCY DEPARTMENT Provider Note   CSN: [id number] Arrival date & time: 08/10/21  1138     History Chief Complaint  Patient presents with   Chest Pain   Nausea    [name] is a 80 y.o. female.  HPI She presents for evaluation of chest pain.  Chest pain occurred yesterday and was present all day long.  Later in the evening she took a single nitroglycerin with improvement of her chest discomfort.  Today she had minimal chest pain.  She is not taking any of her medicines for 3 days due mostly to forgetfulness, although yesterday when she was having pain, that prevented her from taking her medicine.  She is here with her daughter who helps to give history.  She has a history of coronary artery bypass, as well as subsequent stenting.  She has a pacemaker and saw her EP physician, recently and everything was, "good."  She has not been vomiting.  She does not have any shortness of breath or cough.  There has been no fever, vomiting or dizziness.  There are no other known active modifying factors    Past Medical History:  Diagnosis Date   Anemia    Anxiety    Arthritis    "in my hands; knees, back" (09/27/2018)   CAD (coronary artery disease)    a. 40-59% bilaterally 10/2015.   Chronic diastolic CHF (congestive heart failure) ([place])    a. 05/2016 Echo: EF 60-65%, no rwma, Gr1 DD, Ao sclerosis w/o stenosis, triv MR;  b. 07/2016 TEE: EF 55-60%, no rwma, mild MR.   Chronic headaches    CKD (chronic kidney disease), stage III (HCC)    Coronary artery disease    a. 02/2007 Persantine MV: low risk;  b. 11/2011 CABG x 3 (LIMA->LAD, VG->OM, VG->RCA);  c. 05/2016 MV: EF >65%, no isch/infarct, horiz ST dep in I, II, V5-V6.   Depression    Diverticulosis    Esophageal stricture    GERD (gastroesophageal reflux disease)    Hemorrhoids    Hiatal hernia    Hyperkalemia    a. ARB stopped due to this.   Hyperlipidemia    Hypertension    Hypertensive heart disease     Hypothyroidism    Major depressive disorder with anxious distress 09/05/2019   Mild cognitive impairment    a. seen by neurology.   Mild vascular neurocognitive disorder ([place]) 09/05/2019   Myocardial infarction ([place]) 12/07/2011   PAF (paroxysmal atrial fibrillation) ([place])    a. post-op CABG.   Pain    RIGHT KNEE PAIN - TORN RIGHT MEDIAL MENISCUS   Paroxysmal atrial flutter ([place])    a. 07/2016 s/p TEE & DCCV;  b. 07/2016 Recurrent PAFlutter req initiation of amio & PPM in setting of tachy-brady;  c. CHA2DS2VASc = 7-->Xarelto 15 mg QD.   Pneumonia    "twice" (09/27/2018)   PONV (postoperative nausea and vomiting)    Presence of permanent cardiac pacemaker 08/12/2016   S/P CABG (coronary artery bypass graft), 12/04/11 12/07/2011   LIMA to LAD, SVG to OM, SVG to RCA   Sinus bradycardia    a. not on BB due to this.   Skin cancer    "face" (09/27/2018)   Tachy-brady syndrome ([place])    a. 07/2016 Jxnl brady following DCCV, recurrent Aflutter-->amio + SJM 2272 Assurity MRI DC PPM (ser # [id number]).   Type II diabetes mellitus [facility])     Patient Active Problem List  Diagnosis Date Noted   Chest pain 08/10/2021   CKD (chronic kidney disease), stage III ([place]) 08/10/2021   Acute kidney injury superimposed on chronic kidney disease ([place]) 10/03/2020   Nausea & vomiting 10/03/2020   Abdominal pain 10/03/2020   Mild vascular neurocognitive disorder 09/05/2019   Major depressive disorder with anxious distress 09/05/2019   Coag negative Staphylococcus bacteremia 05/21/2019   Acute blood loss anemia 05/21/2019   Anemia of chronic disease 05/21/2019   Anemia in chronic kidney disease 05/21/2019   SSS (sick sinus syndrome) (HCC)    Paroxysmal atrial fibrillation (HCC)    Acute metabolic encephalopathy    Retroperitoneal hematoma    Muscle hemorrhage    S/P placement of cardiac pacemaker    SIRS (systemic inflammatory response syndrome) ([place]) 05/15/2019   Acute encephalopathy 05/15/2019    Status post coronary artery stent placement    CAD S/P percutaneous coronary angioplasty 09/27/2018   Abnormal nuclear stress test    Pain of left shoulder joint on movement 06/16/2018   Closed fracture of upper end of humerus 06/16/2018   Pain in right knee 04/12/2018   Spinal stenosis of lumbar region 02/28/2018   On anticoagulant therapy 04/22/2017   Tachycardia-bradycardia syndrome ([place]) 12/23/2016   Accelerated hypertension    Chronic diastolic CHF (congestive heart failure) ([place]) 08/09/2016   Elevated troponin 08/09/2016   Hypertensive urgency 08/08/2016   Paroxysmal atrial flutter ([place]) 08/08/2016   Coronary artery disease    Hypertensive heart disease    CKD (chronic kidney disease) stage 4, GFR 15-29 ml/min (HCC)    Hypothyroidism    Acute anemia    Pain in the chest    Shortness of breath 05/23/2016   Pre-syncope 05/23/2016   Chest tightness 09/16/2014   Osteoarthritis of right knee 07/02/2014   Acute medial meniscus tear of right knee 07/02/2014   Diabetic peripheral neuropathy associated with type 1 diabetes mellitus ([place]) 09/05/2013   Edema of extremities 04/18/2013   Cellulitis 04/18/2013   Hypertension associated with diabetes ([place]) 01/06/2012   Acute renal failure,admitted 01/03/12, after admission for diastolic chf 83/66/2947   S/P CABG (coronary artery bypass graft), 12/04/11 12/07/2011   Hyperlipidemia associated with type 2 diabetes mellitus ([place]) 12/03/2011    Class: Diagnosis of   NSTEMI (non-ST elevated myocardial infarction) ([place]) 12/01/2011   Family history of early CAD 12/01/2011   Insulin dependent type 2 diabetes mellitus ([place]) 12/14/2010   DYSPHAGIA UNSPECIFIED 12/14/2010   ABDOMINAL PAIN, EPIGASTRIC 12/14/2010   ESOPHAGEAL STRICTURE 11/28/2009   GERD 11/28/2009   FLATULENCE-GAS-BLOATING 11/28/2009   CONSTIPATION, CHRONIC 12/19/2007   SALPINGO-OOPHORITIS 12/19/2007   MENOPAUSE, SURGICAL 12/19/2007   BACK PAIN, LUMBAR 12/19/2007   ANKLE INJURY,  RIGHT 12/19/2007   TOTAL KNEE REPLACEMENT, LEFT, HX OF 12/19/2007    Past Surgical History:  Procedure Laterality Date   ABDOMINAL HYSTERECTOMY  1980's   ANKLE FRACTURE SURGERY Right    "put pins both side right ankle"   BACK SURGERY  2006   "cyst growing near my spine"   CARDIAC CATHETERIZATION  12/02/2011   mild LV dysfunction with mod hypocontractility of mid-distal anterolateral wall; CAD w/ostial tapering of L Main with 50% diffuse ostial narrowing of LAD, 99% eccentric focal prox LAD stenosis followed by 70% prox LAD stenosis after 1st diag, 20% mid LAD narrowing; 80% ostial-to-prox L Cfx stenosis & 40-50% irregularity of RCA (Dr. Corky Downs)   CARDIOVERSION N/A 08/11/2016   Procedure: CARDIOVERSION;  Surgeon: Lelon Perla, MD;  Location: [facility];  Service: Cardiovascular;  Laterality: N/A;   CATARACT EXTRACTION W/ INTRAOCULAR LENS  IMPLANT, BILATERAL Bilateral ~ 2010   CESAREAN SECTION  1977   CORONARY ARTERY BYPASS GRAFT  12/04/2011   Procedure: CORONARY ARTERY BYPASS GRAFTING (CABG);  Surgeon: Tharon Aquas Adelene Idler, MD;  Location: [facility];  Service: Open Heart Surgery;  Laterality: N/A;  CABG x three,  using left internal mammary artery, and right leg greater saphenous vein harvested endoscopically   CORONARY STENT INTERVENTION N/A 09/27/2018   Procedure: CORONARY STENT INTERVENTION;  Surgeon: Troy Sine, MD;  Location: [facility] CV LAB;  Service: Cardiovascular;  Laterality: N/A;   DILATION AND CURETTAGE OF UTERUS     "a couple times"   EP IMPLANTABLE DEVICE N/A 08/12/2016   Procedure: Pacemaker Implant;  Surgeon: Will Meredith Leeds, MD;  Location: [facility] LAB;  Service: Cardiovascular;  Laterality: N/A;   ESOPHAGOGASTRODUODENOSCOPY (EGD) WITH ESOPHAGEAL DILATION     FRACTURE SURGERY     JOINT REPLACEMENT     KNEE ARTHROSCOPY WITH MEDIAL MENISECTOMY Right 07/02/2014   Procedure: RIGHT KNEE ARTHROSCOPY WITH PARTIAL MEDIAL MENISTECTOMY, ABRASION CONDROPLASTYU OF  PATELLA,ABRASION CONDROPLASTY OF MEDIAL FEMEROL CONDYL, MICROFRACTURE OF MEDIAL FEMEROL CONDYL;  Surgeon: Tobi Bastos, MD;  Location: [facility];  Service: Orthopedics;  Laterality: Right;   LEFT HEART CATH AND CORS/GRAFTS ANGIOGRAPHY N/A 09/06/2018   Procedure: LEFT HEART CATH AND CORS/GRAFTS ANGIOGRAPHY;  Surgeon: Troy Sine, MD;  Location: [facility] CV LAB;  Service: Cardiovascular;  Laterality: N/A;   LEFT HEART CATHETERIZATION WITH CORONARY ANGIOGRAM N/A 12/02/2011   Procedure: LEFT HEART CATHETERIZATION WITH CORONARY ANGIOGRAM;  Surgeon: Troy Sine, MD;  Location: [facility] CATH LAB;  Service: Cardiovascular;  Laterality: N/A;  Coronary angiogram, possible PCI   TEE WITHOUT CARDIOVERSION N/A 08/11/2016   Procedure: TRANSESOPHAGEAL ECHOCARDIOGRAM (TEE);  Surgeon: Lelon Perla, MD;  Location: [facility] ENDOSCOPY;  Service: Cardiovascular;  Laterality: N/A;   TEE WITHOUT CARDIOVERSION N/A 05/18/2019   Procedure: TRANSESOPHAGEAL ECHOCARDIOGRAM (TEE);  Surgeon: [name], MD;  Location: [facility] ENDOSCOPY;  Service: Cardiovascular;  Laterality: N/A;   [place] Left ~ 2006   TRANSTHORACIC ECHOCARDIOGRAM  02/19/2013   EF 62-69%, grade 1 diastolic dysfunction; mildly thickend/calcified AV leaflets; mildly calcidied MV annulus; mild TR     OB History   No obstetric history on file.     Family History  Problem Relation Age of Onset   Diabetes Mother    CVA Mother    Hypertension Mother    Heart disease Father    Hyperlipidemia Father    Breast cancer Sister    Breast cancer Sister        x 3   Heart disease Brother        x5; one with MI   Heart disease Sister        x3   Diabetes Sister        x3   Lung cancer Sister    Breast cancer Sister    Colon cancer Neg Hx     Social History   Tobacco Use   Smoking status: Never   Smokeless tobacco: Never  Vaping Use   Vaping Use: Never used  Substance Use Topics   Alcohol use: Not Currently     Comment: very occasional    Drug use: Never    Home Medications Prior to Admission medications   Medication Sig Start Date End Date Taking? Authorizing Provider  acetaminophen (  TYLENOL) 650 MG CR tablet Take 650 mg by mouth every 8 (eight) hours as needed for pain.   Yes [provider]  allopurinol (ZYLOPRIM) 100 MG tablet Take 100 mg by mouth daily.   Yes [provider]  aspirin EC 81 MG tablet Take 81 mg by mouth daily.   Yes [provider]  carvedilol (COREG) 25 MG tablet Take 12.5 mg by mouth 2 (two) times daily.   Yes [provider]  diclofenac Sodium (VOLTAREN) 1 % GEL Apply 2 g topically 2 (two) times daily as needed (pain).   Yes [provider]  docusate sodium (COLACE) 100 MG capsule Take 100 mg by mouth 2 (two) times daily as needed for mild constipation or moderate constipation.   Yes [provider]  FEROSUL 325 (65 Fe) MG tablet Take 325 mg by mouth daily. 04/21/21  Yes [provider]  gabapentin (NEURONTIN) 300 MG capsule Take 1 capsule (300 mg total) by mouth at bedtime. 05/22/19  Yes Roney Jaffe, MD  LANTUS SOLOSTAR 100 UNIT/ML Solostar Pen Inject 26 Units into the skin in the morning. 05/06/21  Yes [provider]  meclizine (ANTIVERT) 25 MG tablet Take 25 mg by mouth daily as needed for dizziness. For dizziness   Yes [provider]  nitroGLYCERIN (NITROSTAT) 0.4 MG SL tablet Place 0.4 mg under the tongue every 5 (five) minutes as needed for chest pain.   Yes [provider]  ondansetron (ZOFRAN-ODT) 4 MG disintegrating tablet Take 4 mg by mouth every 8 (eight) hours as needed for nausea or vomiting. 12/05/20  Yes [provider]  pantoprazole (PROTONIX) 40 MG tablet Take 40 mg by mouth daily. 06/24/20  Yes [provider]  Polyethyl Glycol-Propyl Glycol (SYSTANE OP) Place 1 drop into both eyes 2 (two) times daily as needed (dry eyes).   Yes [provider]   rosuvastatin (CRESTOR) 10 MG tablet Take 10 mg by mouth at bedtime. 01/28/21  Yes [provider]  SYNTHROID 100 MCG tablet Take 100 mcg by mouth every morning. 03/26/21  Yes [provider]  torsemide (DEMADEX) 20 MG tablet Take 20 mg by mouth every other day. 10/24/20  Yes [provider]  nitroGLYCERIN (NITROSTAT) 0.4 MG SL tablet Place 1 tablet (0.4 mg total) under the tongue every 5 (five) minutes as needed for chest pain (max 3 doses). 02/11/20 05/11/20  [name], MD    Allergies    Clonidine derivatives, Mango flavor, Sulfa antibiotics, Sulfa antibiotics, Crestor [rosuvastatin calcium], Epinephrine, Hydralazine, Losartan, and Other  Review of Systems   Review of Systems  All other systems reviewed and are negative.  Physical Exam Updated Vital Signs BP (!) 206/65   Pulse 63   Temp 98.1 F (36.7 C) (Oral)   Resp 16   SpO2 95%   Physical Exam Vitals and nursing note reviewed.  Constitutional:      General: She is not in acute distress.    Appearance: She is well-developed. She is obese. She is not ill-appearing, toxic-appearing or diaphoretic.     Comments: Elderly, frail  HENT:     Head: Normocephalic and atraumatic.     Right Ear: External ear normal.     Left Ear: External ear normal.     Mouth/Throat:     Mouth: Mucous membranes are moist.  Eyes:     Conjunctiva/sclera: Conjunctivae normal.     Pupils: Pupils are equal, round, and reactive to light.  Neck:  Trachea: Phonation normal.  Cardiovascular:     Rate and Rhythm: Normal rate and regular rhythm.     Heart sounds: Normal heart sounds.  Pulmonary:     Effort: Pulmonary effort is normal.     Breath sounds: Normal breath sounds.  Abdominal:     General: There is no distension.     Palpations: Abdomen is soft.     Tenderness: There is no abdominal tenderness.  Musculoskeletal:        General: Normal range of motion.     Cervical back: Normal range of motion and neck  supple.  Skin:    General: Skin is warm and dry.  Neurological:     Mental Status: She is alert and oriented to person, place, and time.     Cranial Nerves: No cranial nerve deficit.     Sensory: No sensory deficit.     Motor: No abnormal muscle tone.     Coordination: Coordination normal.  Psychiatric:        Mood and Affect: Mood normal.        Behavior: Behavior normal.        Thought Content: Thought content normal.        Judgment: Judgment normal.    ED Results / Procedures / Treatments   Labs (all labs ordered are listed, but only abnormal results are displayed) Labs Reviewed  CBC - Abnormal; Notable for the following components:      Result Value   WBC 11.1 (*)    RBC 3.29 (*)    Hemoglobin 11.2 (*)    HCT 33.1 (*)    MCV 100.6 (*)    Platelets 147 (*)    All other components within normal limits  LIPASE, BLOOD - Abnormal; Notable for the following components:   Lipase 61 (*)    All other components within normal limits  URINALYSIS, ROUTINE W REFLEX MICROSCOPIC - Abnormal; Notable for the following components:   Hgb urine dipstick SMALL (*)    Protein, ur >=300 (*)    Leukocytes,Ua SMALL (*)    All other components within normal limits  COMPREHENSIVE METABOLIC PANEL - Abnormal; Notable for the following components:   CO2 21 (*)    Glucose, Bld 231 (*)    BUN 37 (*)    Creatinine, Ser 1.61 (*)    GFR, Estimated 32 (*)    All other components within normal limits  TROPONIN I (HIGH SENSITIVITY) - Abnormal; Notable for the following components:   Troponin I (High Sensitivity) 63 (*)    All other components within normal limits  TROPONIN I (HIGH SENSITIVITY) - Abnormal; Notable for the following components:   Troponin I (High Sensitivity) 77 (*)    All other components within normal limits  RESP PANEL BY RT-PCR (FLU A&B, COVID) ARPGX2  MAGNESIUM  BASIC METABOLIC PANEL  CBC    EKG EKG Interpretation  Date/Time:  Monday August 10 2021 11:46:01  EDT Ventricular Rate:  61 PR Interval:  228 QRS Duration: 88 QT Interval:  454 QTC Calculation: 457 R Axis:   86 Text Interpretation: Atrial-paced rhythm with prolonged AV conduction Nonspecific ST and T wave abnormality Abnormal ECG since last tracing no significant change Confirmed by Daleen Bo [phone]) on 08/10/2021 3:17:01 PM  Radiology DG Chest 2 View  Result Date: 08/10/2021 CLINICAL DATA:  Chest pain. EXAM: CHEST - 2 VIEW COMPARISON:  02/04/2021 FINDINGS: Stable appearance of the cardiac pacemaker. Heart size is upper limits of normal and stable. Enlarged  interstitial lung markings bilaterally and findings are suggestive for interstitial pulmonary edema. Cannot exclude trace pleural fluid. Trachea is midline. Chronic deformity of the proximal left humerus. 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 Findings are suggestive for interstitial pulmonary edema. Electronically Signed   By: Markus Daft M.D.   On: 08/10/2021 12:26    Procedures Procedures   Medications Ordered in ED Medications  aspirin EC tablet 81 mg (has no administration in time range)  torsemide (DEMADEX) tablet 20 mg (has no administration in time range)  isosorbide mononitrate (IMDUR) 24 hr tablet 30 mg (30 mg Oral Given 08/10/21 1931)  amLODipine (NORVASC) tablet 5 mg (5 mg Oral Given 08/10/21 1931)  carvedilol (COREG) tablet 12.5 mg (has no administration in time range)  heparin injection 5,000 Units (has no administration in time range)  sodium chloride flush (NS) 0.9 % injection 3 mL (has no administration in time range)  acetaminophen (TYLENOL) tablet 650 mg (has no administration in time range)    Or  acetaminophen (TYLENOL) suppository 650 mg (has no administration in time range)  senna-docusate (Senokot-S) tablet 1 tablet (has no administration in time range)  ondansetron (ZOFRAN) tablet 4 mg ( Oral See Alternative 08/10/21 2011)    Or  ondansetron [facility])  injection 4 mg (4 mg Intravenous Given 08/10/21 2011)  docusate sodium (COLACE) capsule 100 mg (has no administration in time range)  ferrous sulfate tablet 325 mg (has no administration in time range)  gabapentin (NEURONTIN) capsule 300 mg (has no administration in time range)  insulin glargine (LANTUS) Solostar Pen 26 Units (has no administration in time range)  pantoprazole (PROTONIX) EC tablet 40 mg (has no administration in time range)  rosuvastatin (CRESTOR) tablet 10 mg (has no administration in time range)  levothyroxine (SYNTHROID) tablet 100 mcg (has no administration in time range)  insulin aspart (novoLOG) injection 0-9 Units (has no administration in time range)  labetalol (NORMODYNE) injection 10 mg (10 mg Intravenous Given 08/10/21 2050)  insulin glargine-yfgn (SEMGLEE) injection 12 Units (12 Units Subcutaneous Given 08/10/21 1631)    ED Course  I have reviewed the triage vital signs and the nursing notes.  Pertinent labs & imaging results that were available during my care of the patient were reviewed by me and considered in my medical decision making (see chart for details).    MDM Rules/Calculators/A&P                            Patient Vitals for the past 24 hrs:  BP Temp Temp src Pulse Resp SpO2  08/10/21 2045 (!) 206/65 -- -- 63 16 95 %  08/10/21 2039 (!) 218/81 -- -- 65 17 96 %  08/10/21 2033 (!) 213/72 -- -- 66 18 94 %  08/10/21 1931 (!) 194/165 -- -- -- -- --  08/10/21 1900 (!) 199/62 -- -- 60 (!) 22 95 %  08/10/21 1845 (!) 189/61 -- -- (!) 59 (!) 25 97 %  08/10/21 1830 (!) 146/93 -- -- 63 (!) 22 96 %  08/10/21 1800 (!) 185/57 -- -- 66 (!) 21 98 %  08/10/21 1730 (!) 169/61 -- -- 66 16 98 %  08/10/21 1715 (!) 183/62 -- -- 62 (!) 24 98 %  08/10/21 1700 (!) 199/62 -- -- 64 14 97 %  08/10/21 1630 (!) 126/98 -- -- 60 17 99 %  08/10/21 1615 (!) 181/86 -- -- (!) 59 -- 97 %  08/10/21  1600 (!) 203/71 -- -- (!) 59 -- 96 %  08/10/21 1545 (!) 211/61 -- -- 60 18 97  %  08/10/21 1530 (!) 201/59 -- -- 60 20 97 %  08/10/21 1515 (!) 199/65 -- -- 60 18 98 %  08/10/21 1500 (!) 201/68 -- -- (!) 59 14 95 %  08/10/21 1453 (!) 188/65 -- -- 60 (!) 22 99 %  08/10/21 1308 (!) 217/65 -- -- 60 18 99 %  08/10/21 1143 (!) 219/53 98.1 F (36.7 C) Oral 62 16 97 %     Medical Decision Making:  This patient is presenting for evaluation of chest pain, and not taking usual medicines, which does require a range of treatment options, and is a complaint that involves a high risk of morbidity and mortality. The differential diagnoses include ACS, chest wall pain, nonspecific thoracic pain. I decided to review old records, and in summary elderly female with history of cardiac disease, status post CABG and stenting, presenting with chest pain yesterday, essentially no chest pain today.  She had a prolonged wait in the waiting room prior to evaluation.  She has insulin-dependent diabetes, hypertension, and paroxysmal atrial fibrillation..  I obtained additional historical information from daughter at bedside.  Clinical Laboratory Tests Ordered, included CBC, Metabolic panel, and delta troponin, lipase . Review indicates normal except CO2 low, glucose high, BUN high, creatinine high, delta troponin high, mildly elevated on second testing, lipase high, hemoglobin low, white count elevated, urinalysis with nonspecific findings.. Radiologic Tests Ordered, included chest x-ray.  I independently Visualized: Radiographic images, which show mild pulmonary edema.  Cardiac Monitor Tracing which shows atrial paced rhythm   Critical Interventions-evaluation, laboratory testing, radiography.  Discussion with cardiology who saw the patient in the ED.  Observation and reassessment  After These Interventions, the Patient was reevaluated and was found stable for discharge.  Patient with coronary artery disease, with concerning pain for ischemia and abnormal delta troponin.  Suspect mild cardiac  injury/NSTEMI.  She will require hospitalization for further management and treatment.  CRITICAL CARE-no Performed by: Daleen Bo  Nursing Notes Reviewed/ Care Coordinated Applicable Imaging Reviewed Interpretation of Laboratory Data incorporated into ED treatment  I contacted on-call hospitalist who admitted the patient.    Final Clinical Impression(s) / ED Diagnoses Final diagnoses:  NSTEMI (non-ST elevated myocardial infarction) [facility])    Rx / [facility] Orders ED Discharge Orders     None        Daleen Bo, MD 08/10/21 2104

## 2021-08-11 ENCOUNTER — Observation Stay (HOSPITAL_BASED_OUTPATIENT_CLINIC_OR_DEPARTMENT_OTHER): Payer: Medicare Other

## 2021-08-11 DIAGNOSIS — R079 Chest pain, unspecified: Secondary | ICD-10-CM

## 2021-08-11 DIAGNOSIS — R0782 Intercostal pain: Secondary | ICD-10-CM

## 2021-08-11 LAB — MAGNESIUM: Magnesium: 2 mg/dL (ref 1.7–2.4)

## 2021-08-11 LAB — CBG MONITORING, ED
Glucose-Capillary: 101 mg/dL — ABNORMAL HIGH (ref 70–99)
Glucose-Capillary: 175 mg/dL — ABNORMAL HIGH (ref 70–99)

## 2021-08-11 LAB — CBC
HCT: 28.2 % — ABNORMAL LOW (ref 36.0–46.0)
Hemoglobin: 9.4 g/dL — ABNORMAL LOW (ref 12.0–15.0)
MCH: 33.5 pg (ref 26.0–34.0)
MCHC: 33.3 g/dL (ref 30.0–36.0)
MCV: 100.4 fL — ABNORMAL HIGH (ref 80.0–100.0)
Platelets: 137 10*3/uL — ABNORMAL LOW (ref 150–400)
RBC: 2.81 MIL/uL — ABNORMAL LOW (ref 3.87–5.11)
RDW: 13.6 % (ref 11.5–15.5)
WBC: 8.6 10*3/uL (ref 4.0–10.5)
nRBC: 0 % (ref 0.0–0.2)

## 2021-08-11 LAB — BASIC METABOLIC PANEL
Anion gap: 8 (ref 5–15)
BUN: 39 mg/dL — ABNORMAL HIGH (ref 8–23)
CO2: 24 mmol/L (ref 22–32)
Calcium: 8.6 mg/dL — ABNORMAL LOW (ref 8.9–10.3)
Chloride: 107 mmol/L (ref 98–111)
Creatinine, Ser: 2.04 mg/dL — ABNORMAL HIGH (ref 0.44–1.00)
GFR, Estimated: 24 mL/min — ABNORMAL LOW (ref >60)
Glucose, Bld: 134 mg/dL — ABNORMAL HIGH (ref 70–99)
Potassium: 4.6 mmol/L (ref 3.5–5.1)
Sodium: 139 mmol/L (ref 135–145)

## 2021-08-11 LAB — ECHOCARDIOGRAM LIMITED
Area-P 1/2: 2.62 cm2
S' Lateral: 3.2 cm

## 2021-08-11 MED ORDER — AMLODIPINE BESYLATE 5 MG PO TABS: 5.0000 mg | ORAL_TABLET | Freq: Every day | ORAL | 0 refills | Status: DC

## 2021-08-11 MED ORDER — ISOSORBIDE MONONITRATE ER 30 MG PO TB24: 30.0000 mg | ORAL_TABLET | Freq: Every day | ORAL | 0 refills | Status: DC

## 2021-08-11 NOTE — Discharge Summary (Signed)
Physician Discharge Summary  [name] ZDG:[id number] DOB: 1940-11-22 DOA: 08/10/2021  PCP: Leeroy Cha, MD  Admit date: 08/10/2021 Discharge date: 08/11/2021  Admitted From: Home Disposition: Home  Recommendations for Outpatient Follow-up:  Follow up with PCP in 1-2 weeks Cardiology will schedule follow-up.  Home Health: N/A Equipment/Devices: N/A  Discharge Condition: Stable CODE STATUS: DNR Diet recommendation: Low-salt, low-carb diet  Discharge summary: History of coronary artery disease status post CABG with residual severe distal disease, paroxysmal A. fib no longer on anticoagulation, tachybradycardia syndrome status post pacemaker, chronic diastolic heart failure, stage IIIb chronic kidney disease, insulin-dependent diabetes, hypertension and hyperlipidemia and mild cognitive impairment lives at home brought to the ER with substernal heavy chest pressure sensation for the last 2 days.  On arrival, EKG and troponin nonischemic.  Mildly elevated troponins and chronic.  Known distal coronary disease with multiple comorbidities.  She does have chronic intermittent pain improved with nitrates but she is not often taking it.  Echocardiogram was done that was essentially with old findings consistent with CABG.  No ongoing chest pain or ischemia.  COVID-19 positive.  Chest pain: Acute coronary syndrome ruled out.  Seen by cardiology in consultation.  Known distal coronary disease not amenable to PCI.  Decided against any intervention.  Since patient adequately improved discharged home to continue aspirin and beta-blockers.  Patient has benefited with nitrates, started on Imdur and also started on amlodipine for blood pressure control.  Cardiology will schedule outpatient follow-up.  COVID-19 infection: No evidence of hypoxemia or pneumonia.  She does have some dry cough on deep breathing.  Might have pleuritic pain.  Discussed with patient's family about taking antiviral for  5 days, they decided against it.  Monitor symptoms at home.  Take over-the-counter medications including Tylenol and cough medications if needed.  Will report for any worsening symptoms.  Other chronic medical issues remained stable.   Discharge Diagnoses:  Principal Problem:   Chest pain Active Problems:   Insulin dependent type 2 diabetes mellitus (HCC)   Hyperlipidemia associated with type 2 diabetes mellitus (HCC)   S/P CABG (coronary artery bypass graft), 12/04/11   Hypertension associated with diabetes ([place])   Hypothyroidism   Paroxysmal atrial flutter (HCC)   CKD (chronic kidney disease), stage III [facility])    Discharge Instructions  Discharge Instructions     Call MD for:  difficulty breathing, headache or visual disturbances   Complete by: As directed    Call MD for:  persistant dizziness or light-headedness   Complete by: As directed    Diet - low sodium heart healthy   Complete by: As directed    Diet Carb Modified   Complete by: As directed    Discharge instructions   Complete by: As directed    Can use over-the-counter cough medications without decongestant and Tylenol as needed. Report for any worsening fever or shortness of breath. Isolation precautions advised for 5-7 days from diagnosis.   Increase activity slowly   Complete by: As directed       Allergies as of 08/11/2021       Reactions   Clonidine Derivatives Other (See Comments)   Bradycardia and fatigue   Mango Flavor Hives   Sulfa Antibiotics Other (See Comments)   Unknown   Sulfa Antibiotics    Crestor [rosuvastatin Calcium] Other (See Comments)   Other reaction(s): tired and weak   Epinephrine Other (See Comments)   Abnormal feeling. Dental exam/injection of local w/ epi.   Hydralazine Other (See  Comments)   Nausea/gi upset   Losartan Other (See Comments)   Hyperkalemia   Other Other (See Comments)   MANGO'S - WELTS ALL OVER        Medication List     TAKE these medications     acetaminophen 650 MG CR tablet Commonly known as: TYLENOL Take 650 mg by mouth every 8 (eight) hours as needed for pain.   allopurinol 100 MG tablet Commonly known as: ZYLOPRIM Take 100 mg by mouth daily.   amLODipine 5 MG tablet Commonly known as: NORVASC Take 1 tablet (5 mg total) by mouth daily. Start taking on: August 12, 2021   aspirin EC 81 MG tablet Take 81 mg by mouth daily.   carvedilol 25 MG tablet Commonly known as: COREG Take 12.5 mg by mouth 2 (two) times daily.   diclofenac Sodium 1 % Gel Commonly known as: VOLTAREN Apply 2 g topically 2 (two) times daily as needed (pain).   docusate sodium 100 MG capsule Commonly known as: COLACE Take 100 mg by mouth 2 (two) times daily as needed for mild constipation or moderate constipation.   FeroSul 325 (65 FE) MG tablet Generic drug: ferrous sulfate Take 325 mg by mouth daily.   gabapentin 300 MG capsule Commonly known as: NEURONTIN Take 1 capsule (300 mg total) by mouth at bedtime.   isosorbide mononitrate 30 MG 24 hr tablet Commonly known as: IMDUR Take 1 tablet (30 mg total) by mouth daily. Start taking on: August 12, 2021   Lantus SoloStar 100 UNIT/ML Solostar Pen Generic drug: insulin glargine Inject 26 Units into the skin in the morning.   meclizine 25 MG tablet Commonly known as: ANTIVERT Take 25 mg by mouth daily as needed for dizziness. For dizziness   nitroGLYCERIN 0.4 MG SL tablet Commonly known as: NITROSTAT Place 0.4 mg under the tongue every 5 (five) minutes as needed for chest pain.   nitroGLYCERIN 0.4 MG SL tablet Commonly known as: NITROSTAT Place 1 tablet (0.4 mg total) under the tongue every 5 (five) minutes as needed for chest pain (max 3 doses).   ondansetron 4 MG disintegrating tablet Commonly known as: ZOFRAN-ODT Take 4 mg by mouth every 8 (eight) hours as needed for nausea or vomiting.   pantoprazole 40 MG tablet Commonly known as: PROTONIX Take 40 mg by mouth daily.    rosuvastatin 10 MG tablet Commonly known as: CRESTOR Take 10 mg by mouth at bedtime.   Synthroid 100 MCG tablet Generic drug: levothyroxine Take 100 mcg by mouth every morning.   SYSTANE OP Place 1 drop into both eyes 2 (two) times daily as needed (dry eyes).   torsemide 20 MG tablet Commonly known as: DEMADEX Take 20 mg by mouth every other day.        Follow-up Information     Leeroy Cha, MD Follow up.   Specialty: Internal Medicine Contact information: [address] [place] [address] [phone]         Constance Haw, MD .   Specialty: Cardiology Contact information: [place] [address] [phone]         [name], MD .   Specialty: Cardiology Contact information: [address] [place] [address] [phone]                Allergies  Allergen Reactions   Clonidine Derivatives Other (See Comments)    Bradycardia and fatigue    Mango Flavor Hives  Sulfa Antibiotics Other (See Comments)    Unknown   Sulfa Antibiotics    Crestor [Rosuvastatin Calcium] Other (See Comments)    Other reaction(s): tired and weak   Epinephrine Other (See Comments)    Abnormal feeling. Dental exam/injection of local w/ epi.   Hydralazine Other (See Comments)    Nausea/gi upset    Losartan Other (See Comments)    Hyperkalemia    Other Other (See Comments)    MANGO'S - WELTS ALL OVER    Consultations: Cardiology   Procedures/Studies: DG Chest 2 View  Result Date: 08/10/2021 CLINICAL DATA:  Chest pain. EXAM: CHEST - 2 VIEW COMPARISON:  02/04/2021 FINDINGS: Stable appearance of the cardiac pacemaker. Heart size is upper limits of normal and stable. Enlarged interstitial lung markings bilaterally and findings are suggestive for interstitial pulmonary edema. Cannot exclude trace pleural fluid. Trachea is midline. Chronic deformity of the proximal left humerus. 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 Findings are suggestive for interstitial pulmonary edema. Electronically Signed   By: Markus Daft M.D.   On: 08/10/2021 12:26   ECHOCARDIOGRAM LIMITED  Result Date: 08/11/2021    ECHOCARDIOGRAM LIMITED REPORT   Patient Name:   [name] Date of Exam: 08/11/2021 Medical Rec #:  [id number]       Height:       60.0 in Accession #:    [id number]      Weight:       126.4 lb Date of Birth:  [birth year]        BSA:          1.536 m Patient Age:    80 years        BP:           111/42 mmHg Patient Gender: F               HR:           63 bpm. Exam Location:  Inpatient Procedure: Limited Color Doppler, Cardiac Doppler and Limited Echo Indications:    chest pain  History:        Patient has prior history of Echocardiogram examinations, most                 recent 05/18/2019. Prior CABG, chronic kidney disease; Risk                 Factors:Hypertension and Dyslipidemia.  Sonographer:    Johny Chess RDCS Referring Phys: [place]  1. Left ventricular ejection fraction, by estimation, is 50 to 55%. The left ventricle has low normal function. The left ventricle demonstrates global hypokinesis. Left ventricular diastolic parameters are consistent with Grade I diastolic dysfunction (impaired relaxation).  2. Right ventricular systolic function is mildly reduced. The right ventricular size is normal. Tricuspid regurgitation signal is inadequate for assessing PA pressure.  3. The mitral valve is normal in structure. Trivial mitral valve regurgitation. No evidence of mitral stenosis.  4. The aortic valve is tricuspid. Aortic valve regurgitation is not visualized. No aortic stenosis is present.  5. The inferior vena cava is normal in size with greater than 50% respiratory variability, suggesting right atrial pressure of 3 mmHg. FINDINGS  Left Ventricle: Left ventricular ejection fraction, by estimation, is 50 to 55%. The left  ventricle has low normal function. The left ventricle demonstrates global hypokinesis. Left ventricular diastolic parameters are consistent with Grade I diastolic dysfunction (impaired relaxation). Right Ventricle: The right ventricular size is  normal. Right ventricular systolic function is mildly reduced. Tricuspid regurgitation signal is inadequate for assessing PA pressure. Left Atrium: Left atrial size was normal in size. Right Atrium: Right atrial size was normal in size. Mitral Valve: The mitral valve is normal in structure. Trivial mitral valve regurgitation. No evidence of mitral valve stenosis. Tricuspid Valve: The tricuspid valve is normal in structure. Tricuspid valve regurgitation is not demonstrated. Aortic Valve: The aortic valve is tricuspid. Aortic valve regurgitation is not visualized. No aortic stenosis is present. Aorta: The aortic root is normal in size and structure. Venous: The inferior vena cava is normal in size with greater than 50% respiratory variability, suggesting right atrial pressure of 3 mmHg. IAS/Shunts: No atrial level shunt detected by color flow Doppler. Additional Comments: A device lead is visualized in the right ventricle. LEFT VENTRICLE PLAX 2D LVIDd:         4.80 cm   Diastology LVIDs:         3.20 cm   LV e' medial:    4.24 cm/s LV PW:         0.90 cm   LV E/e' medial:  18.6 LV IVS:        0.90 cm   LV e' lateral:   4.03 cm/s LVOT diam:     1.50 cm   LV E/e' lateral: 19.6 LV SV:         33 LV SV Index:   21 LVOT Area:     1.77 cm  IVC IVC diam: 1.20 cm LEFT ATRIUM         Index LA diam:    4.40 cm 2.86 cm/m  AORTIC VALVE LVOT Vmax:   79.30 cm/s LVOT Vmean:  54.800 cm/s LVOT VTI:    0.186 m  AORTA Ao Root diam: 2.30 cm Ao Asc diam:  2.90 cm MITRAL VALVE MV Area (PHT): 2.62 cm    SHUNTS MV Decel Time: 289 msec    Systemic VTI:  0.19 m MV E velocity: 78.80 cm/s  Systemic Diam: 1.50 cm MV A velocity: 97.30 cm/s MV E/A ratio:  0.81 Dalton McleanMD Electronically signed by  Franki Monte Signature Date/Time: 08/11/2021/2:43:24 PM    Final    (Echo, Carotid, EGD, Colonoscopy, ERCP)    Subjective: Patient seen and examined.  She was in the emergency room when I examined her.  Poor historian.  She denies any complaints.  She tells me that she was not hurting very mild on deep breathing.  Her daughter was at the bedside.  Denies any fever or chills at home.  Denies any other sick contacts.  Discussed whether she may benefit with 5 days of paxlovid and her daughter decided that she will monitor for symptoms but not to start on antiviral.  Were comfortable to go home.  Her daughter lives nearby.   Discharge Exam: Vitals:   08/11/21 1500 08/11/21 1514  BP: (!) 112/39 (!) 116/44  Pulse: 62 60  Resp: 13 15  Temp:  98 F (36.7 C)  SpO2: 95% 97%   Vitals:   08/11/21 1410 08/11/21 1415 08/11/21 1500 08/11/21 1514  BP: (!) 105/42 (!) 100/41 (!) 112/39 (!) 116/44  Pulse: 67 62 62 60  Resp: 12 14 13 15   Temp:    98 F (36.7 C)  TempSrc:    Oral  SpO2: 96% 97% 95% 97%    General: Pt is alert, awake, not in acute distress Pleasant. Cardiovascular: RRR, S1/S2 +, no rubs, no gallops Respiratory: CTA  bilaterally, no wheezing, no rhonchi Abdominal: Soft, NT, ND, bowel sounds + Extremities: no edema, no cyanosis    The results of significant diagnostics from this hospitalization (including imaging, microbiology, ancillary and laboratory) are listed below for reference.     Microbiology: Recent Results (from the past 240 hour(s))  Resp Panel by RT-PCR (Flu A&B, Covid) Nasopharyngeal Swab     Status: Abnormal   Collection Time: 08/10/21  8:30 PM   Specimen: Nasopharyngeal Swab; Nasopharyngeal(NP) swabs in vial transport medium  Result Value Ref Range Status   SARS Coronavirus 2 by RT PCR POSITIVE (A) NEGATIVE Final    Comment: RESULT CALLED TO, READ BACK BY AND VERIFIED WITH: C COGAN,RN@2209  08/10/21 [place] (NOTE) SARS-CoV-2 target nucleic acids are  DETECTED.  The SARS-CoV-2 RNA is generally detectable in upper respiratory specimens during the acute phase of infection. Positive results are indicative of the presence of the identified virus, but do not rule out bacterial infection or co-infection with other pathogens not detected by the test. Clinical correlation with patient history and other diagnostic information is necessary to determine patient infection status. The expected result is Negative.  Fact Sheet for Patients: EntrepreneurPulse.com.au  Fact Sheet for Healthcare Providers: IncredibleEmployment.be  This test is not yet approved or cleared by the [place] FDA and  has been authorized for detection and/or diagnosis of SARS-CoV-2 by FDA under an Emergency Use Authorization (EUA).  This EUA will remain in effect (meaning this test can be used)  for the duration of  the COVID-19 declaration under Section 564(b)(1) of the Act, 21 U.S.C. section 360bbb-3(b)(1), unless the authorization is terminated or revoked sooner.     Influenza A by PCR NEGATIVE NEGATIVE Final   Influenza B by PCR NEGATIVE NEGATIVE Final    Comment: (NOTE) The Xpert Xpress SARS-CoV-2/FLU/RSV plus assay is intended as an aid in the diagnosis of influenza from Nasopharyngeal swab specimens and should not be used as a sole basis for treatment. Nasal washings and aspirates are unacceptable for Xpert Xpress SARS-CoV-2/FLU/RSV testing.  Fact Sheet for Patients: EntrepreneurPulse.com.au  Fact Sheet for Healthcare Providers: IncredibleEmployment.be  This test is not yet approved or cleared by the [place] FDA and has been authorized for detection and/or diagnosis of SARS-CoV-2 by FDA under an Emergency Use Authorization (EUA). This EUA will remain in effect (meaning this test can be used) for the duration of the COVID-19 declaration under Section 564(b)(1) of the Act, 21  U.S.C. section 360bbb-3(b)(1), unless the authorization is terminated or revoked.  Performed at [facility], [place] [address] [place], [place] [address]      Labs: BNP (last 3 results) No results for input(s): BNP in the last 8760 hours. Basic Metabolic Panel: Recent Labs  Lab 08/10/21 1156 08/11/21 0321  NA 137 139  K 4.5 4.6  CL 106 107  CO2 21* 24  GLUCOSE 231* 134*  BUN 37* 39*  CREATININE 1.61* 2.04*  CALCIUM 9.1 8.6*  MG  --  2.0   Liver Function Tests: Recent Labs  Lab 08/10/21 1156  AST 22  ALT 17  ALKPHOS 73  BILITOT 0.8  PROT 6.8  ALBUMIN 3.7   Recent Labs  Lab 08/10/21 1156  LIPASE 61*   No results for input(s): AMMONIA in the last 168 hours. CBC: Recent Labs  Lab 08/10/21 1156 08/11/21 0321  WBC 11.1* 8.6  HGB 11.2* 9.4*  HCT 33.1* 28.2*  MCV 100.6* 100.4*  PLT 147* 137*   Cardiac Enzymes: No results  for input(s): CKTOTAL, CKMB, CKMBINDEX, TROPONINI in the last 168 hours. BNP: Invalid input(s): POCBNP CBG: Recent Labs  Lab 08/10/21 2235 08/11/21 0749 08/11/21 1110  GLUCAP 149* 101* 175*   D-Dimer No results for input(s): DDIMER in the last 72 hours. Hgb A1c No results for input(s): HGBA1C in the last 72 hours. Lipid Profile No results for input(s): CHOL, HDL, LDLCALC, TRIG, CHOLHDL, LDLDIRECT in the last 72 hours. Thyroid function studies No results for input(s): TSH, T4TOTAL, T3FREE, THYROIDAB in the last 72 hours.  Invalid input(s): FREET3 Anemia work up No results for input(s): VITAMINB12, FOLATE, FERRITIN, TIBC, IRON, RETICCTPCT in the last 72 hours. Urinalysis    Component Value Date/Time   COLORURINE YELLOW 08/10/2021 1151   APPEARANCEUR CLEAR 08/10/2021 1151   LABSPEC 1.017 08/10/2021 1151   PHURINE 6.0 08/10/2021 1151   GLUCOSEU NEGATIVE 08/10/2021 1151   HGBUR SMALL (A) 08/10/2021 1151   BILIRUBINUR NEGATIVE 08/10/2021 1151   KETONESUR NEGATIVE 08/10/2021 1151   PROTEINUR >=300 (A) 08/10/2021 1151    UROBILINOGEN 0.2 01/04/2012 1105   NITRITE NEGATIVE 08/10/2021 1151   LEUKOCYTESUR SMALL (A) 08/10/2021 1151   Sepsis Labs Invalid input(s): PROCALCITONIN,  WBC,  LACTICIDVEN Microbiology Recent Results (from the past 240 hour(s))  Resp Panel by RT-PCR (Flu A&B, Covid) Nasopharyngeal Swab     Status: Abnormal   Collection Time: 08/10/21  8:30 PM   Specimen: Nasopharyngeal Swab; Nasopharyngeal(NP) swabs in vial transport medium  Result Value Ref Range Status   SARS Coronavirus 2 by RT PCR POSITIVE (A) NEGATIVE Final    Comment: RESULT CALLED TO, READ BACK BY AND VERIFIED WITH: C COGAN,RN@2209  08/10/21 [place] (NOTE) SARS-CoV-2 target nucleic acids are DETECTED.  The SARS-CoV-2 RNA is generally detectable in upper respiratory specimens during the acute phase of infection. Positive results are indicative of the presence of the identified virus, but do not rule out bacterial infection or co-infection with other pathogens not detected by the test. Clinical correlation with patient history and other diagnostic information is necessary to determine patient infection status. The expected result is Negative.  Fact Sheet for Patients: EntrepreneurPulse.com.au  Fact Sheet for Healthcare Providers: IncredibleEmployment.be  This test is not yet approved or cleared by the [place] FDA and  has been authorized for detection and/or diagnosis of SARS-CoV-2 by FDA under an Emergency Use Authorization (EUA).  This EUA will remain in effect (meaning this test can be used)  for the duration of  the COVID-19 declaration under Section 564(b)(1) of the Act, 21 U.S.C. section 360bbb-3(b)(1), unless the authorization is terminated or revoked sooner.     Influenza A by PCR NEGATIVE NEGATIVE Final   Influenza B by PCR NEGATIVE NEGATIVE Final    Comment: (NOTE) The Xpert Xpress SARS-CoV-2/FLU/RSV plus assay is intended as an aid in the diagnosis of influenza from  Nasopharyngeal swab specimens and should not be used as a sole basis for treatment. Nasal washings and aspirates are unacceptable for Xpert Xpress SARS-CoV-2/FLU/RSV testing.  Fact Sheet for Patients: EntrepreneurPulse.com.au  Fact Sheet for Healthcare Providers: IncredibleEmployment.be  This test is not yet approved or cleared by the [place] FDA and has been authorized for detection and/or diagnosis of SARS-CoV-2 by FDA under an Emergency Use Authorization (EUA). This EUA will remain in effect (meaning this test can be used) for the duration of the COVID-19 declaration under Section 564(b)(1) of the Act, 21 U.S.C. section 360bbb-3(b)(1), unless the authorization is terminated or revoked.  Performed at [facility], [place]  [address] [place], [place] [address]      Time coordinating discharge:  28 minutes  SIGNED:   Barb Merino, MD  [facility] 08/11/2021, 3:57 PM

## 2021-08-11 NOTE — ED Notes (Signed)
Breakfast Orders placed 

## 2021-08-11 NOTE — Progress Notes (Signed)
Inpatient Diabetes Program Recommendations  AACE/ADA: New Consensus Statement on Inpatient Glycemic Control (2015)  Target Ranges:  Prepandial:   less than 140 mg/dL      Peak postprandial:   less than 180 mg/dL (1-2 hours)      Critically ill patients:  140 - 180 mg/dL   Lab Results  Component Value Date   GLUCAP 101 (H) 08/11/2021   HGBA1C 8.6 (H) 10/04/2020    Review of Glycemic Control Results for [name], [name] (MRN [id number]) as of 08/11/2021 09:39  Ref. Range 08/10/2021 22:35 08/11/2021 07:49  Glucose-Capillary Latest Ref Range: 70 - 99 mg/dL 149 (H) 101 (H)   Diabetes history: DM 2 Outpatient Diabetes medications:  Lantus 26 units q AM Current orders for Inpatient glycemic control:  Novolog sensitive tid with meals  Semglee 26 units daily Inpatient Diabetes Program Recommendations:    Please reduce Semglee to 12 units daily. Also please consider adding Novolog sensitive correction tid with meals and HS.   Thanks,   Adah Perl, RN, BC-ADM Inpatient Diabetes Coordinator Pager [phone]  (8a-5p)

## 2021-08-11 NOTE — Progress Notes (Addendum)
Progress Note  Patient Name: [name] Date of Encounter: 08/11/2021  [facility] Cardiologist: [name], MD   Subjective   CP has improved, still w/ some chest wall tenderness.  Daughter present, reviewed cath film from 2019, has 2 areas that are not amenable to PCI.  Inpatient Medications    Scheduled Meds:  amLODipine  5 mg Oral Daily   aspirin EC  81 mg Oral Daily   carvedilol  12.5 mg Oral BID WC   ferrous sulfate  325 mg Oral Q breakfast   gabapentin  300 mg Oral QHS   heparin  5,000 Units Subcutaneous Q8H   insulin aspart  0-9 Units Subcutaneous TID WC   insulin glargine-yfgn  26 Units Subcutaneous Daily   isosorbide mononitrate  30 mg Oral Daily   levothyroxine  100 mcg Oral q morning   pantoprazole  40 mg Oral Daily   rosuvastatin  10 mg Oral QHS   sodium chloride flush  3 mL Intravenous Q12H   [START ON 08/12/2021] torsemide  20 mg Oral QODAY   Continuous Infusions:  PRN Meds: acetaminophen **OR** acetaminophen, albuterol, docusate sodium, guaiFENesin-dextromethorphan, labetalol, ondansetron **OR** ondansetron (ZOFRAN) IV, senna-docusate   Vital Signs    Vitals:   08/11/21 0730 08/11/21 0800 08/11/21 0830 08/11/21 0949  BP: (!) 155/60 (!) 154/52 (!) 178/61 (!) 159/73  Pulse: 60 63 60 61  Resp: 17 18 13 15   Temp:    98.7 F (37.1 C)  TempSrc:    Oral  SpO2: 95% 94% 94% 95%   No intake or output data in the 24 hours ending 08/11/21 0958 Last 3 Weights 07/06/2021 04/09/2021 03/22/2021  Weight (lbs) 126 lb 6.4 oz 125 lb 6.4 oz 123 lb  Weight (kg) 57.335 kg 56.881 kg 55.792 kg      Telemetry    SR - Personally Reviewed  ECG    None today - Personally Reviewed  Physical Exam   GEN: No acute distress.   Neck: No JVD Cardiac: RRR, no murmurs, rubs, or gallops.  Respiratory: Clear to auscultation bilaterally. GI: Soft, nontender, non-distended  MS: No edema; No deformity. Neuro:  Nonfocal  Psych: Normal affect   Labs    High  Sensitivity Troponin:   Recent Labs  Lab 08/10/21 1156 08/10/21 1413  TROPONINIHS 63* 77*     Chemistry Recent Labs  Lab 08/10/21 1156 08/11/21 0321  NA 137 139  K 4.5 4.6  CL 106 107  CO2 21* 24  GLUCOSE 231* 134*  BUN 37* 39*  CREATININE 1.61* 2.04*  CALCIUM 9.1 8.6*  MG  --  2.0  PROT 6.8  --   ALBUMIN 3.7  --   AST 22  --   ALT 17  --   ALKPHOS 73  --   BILITOT 0.8  --   GFRNONAA 32* 24*  ANIONGAP 10 8    Lipids No results for input(s): CHOL, TRIG, HDL, LABVLDL, LDLCALC, CHOLHDL in the last 168 hours.  Hematology Recent Labs  Lab 08/10/21 1156 08/11/21 0321  WBC 11.1* 8.6  RBC 3.29* 2.81*  HGB 11.2* 9.4*  HCT 33.1* 28.2*  MCV 100.6* 100.4*  MCH 34.0 33.5  MCHC 33.8 33.3  RDW 13.3 13.6  PLT 147* 137*   Thyroid  Lab Results  Component Value Date   TSH 3.37 11/28/2020     BNPNo results for input(s): BNP, PROBNP in the last 168 hours.  DDimer No results for input(s): DDIMER in the last  168 hours.   Radiology    DG Chest 2 View  Result Date: 08/10/2021 CLINICAL DATA:  Chest pain. EXAM: CHEST - 2 VIEW COMPARISON:  02/04/2021 FINDINGS: Stable appearance of the cardiac pacemaker. Heart size is upper limits of normal and stable. Enlarged interstitial lung markings bilaterally and findings are suggestive for interstitial pulmonary edema. Cannot exclude trace pleural fluid. Trachea is midline. Chronic deformity of the proximal left humerus. 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 Findings are suggestive for interstitial pulmonary edema. Electronically Signed   By: Markus Daft M.D.   On: 08/10/2021 12:26    Cardiac Studies   ECHO: ordered  CARDIAC CATH: 2019 Diagnostic Dominance: Right   Patient Profile     80 y.o. female with Hx of CABG with LIMA to LAD SVG OM SVT to RCA in 2013 09/06/18 she had stenting of the SVG to OM, multiple complaints of chest pain, chronically elevated troponin, dementia, CKD 3, HTN,  HLD, hypothyroid, GERD, who was admitted 10/17 with chest pain.  Assessment & Plan    1.  Chest pain: -Troponin 77 and 63 are actually lower than troponins of 110 and 102 in December 2021. - No ECG changes - Reviewed w/ dtr that she has distal LAD dz and distal CFX/OM dz not amenable to percutaneous intervention - CP has been exertional at times, rarely takes nitro, but that helps. - explained that Imdur is a good idea and may help - called echo to get that done sooner rather than later - unless echo significantly abnl, no further eval.   For questions or updates, please contact [place] Please consult [email or web address] for contact info under        Signed, Rosaria Ferries, PA-C  08/11/2021, 9:58 AM    Patient examined chart reviewed Elderly female with dementia.  COVID positive atypical pain. With CRF, anemia and low PLTls and prior cath showing disease post CABG not amenable to intervention no need for stress testing or repeat cath. Start long acting nitrates Ok to d/c home if echo shows no drastic change in EF  Jenkins Rouge MD [facility]

## 2021-08-11 NOTE — Progress Notes (Signed)
  Echocardiogram 2D Echocardiogram has been performed.  [name] 08/11/2021, 1:37 PM

## 2021-08-11 NOTE — ED Notes (Signed)
IP MD notified of pt critical HGB 9.4

## 2021-08-18 ENCOUNTER — Ambulatory Visit (INDEPENDENT_AMBULATORY_CARE_PROVIDER_SITE_OTHER): Payer: Medicare Other

## 2021-08-18 DIAGNOSIS — I495 Sick sinus syndrome: Secondary | ICD-10-CM

## 2021-08-18 LAB — CUP PACEART REMOTE DEVICE CHECK
Battery Remaining Longevity: 62 mo
Battery Remaining Percentage: 54 %
Battery Voltage: 2.98 V
Brady Statistic AP VP Percent: 1 %
Brady Statistic AP VS Percent: 99 %
Brady Statistic AS VP Percent: 1 %
Brady Statistic AS VS Percent: 1 %
Brady Statistic RA Percent Paced: 98 %
Brady Statistic RV Percent Paced: 1 %
Date Time Interrogation Session: 20221025052314
Implantable Lead Implant Date: 20171019
Implantable Lead Implant Date: 20171019
Implantable Lead Location: 753859
Implantable Lead Location: 753860
Implantable Pulse Generator Implant Date: 20171019
Lead Channel Impedance Value: 380 Ohm
Lead Channel Impedance Value: 710 Ohm
Lead Channel Pacing Threshold Amplitude: 0.5 V
Lead Channel Pacing Threshold Amplitude: 0.75 V
Lead Channel Pacing Threshold Pulse Width: 0.4 ms
Lead Channel Pacing Threshold Pulse Width: 0.4 ms
Lead Channel Sensing Intrinsic Amplitude: 12 mV
Lead Channel Sensing Intrinsic Amplitude: 5 mV
Lead Channel Setting Pacing Amplitude: 2 V
Lead Channel Setting Pacing Amplitude: 2.5 V
Lead Channel Setting Pacing Pulse Width: 0.4 ms
Lead Channel Setting Sensing Sensitivity: 2 mV
Pulse Gen Model: 2272
Pulse Gen Serial Number: 3180458

## 2021-08-26 NOTE — Progress Notes (Signed)
Remote pacemaker transmission.   

## 2021-08-26 NOTE — Progress Notes (Signed)
Cardiology Clinic Note   Patient Name: CASSIDEE DEATS Date of Encounter: 08/27/2021  Primary Care Provider:  Leeroy Cha, MD Primary Cardiologist:  [name], MD  Patient Profile    Luberta Mutter. [age] year old female presents today for follow-up for coronary artery disease, and paroxysmal a.fib.  Past Medical History    Past Medical History:  Diagnosis Date   Anemia    Anxiety    Arthritis    "in my hands; knees, back" (09/27/2018)   CAD (coronary artery disease)    a. 40-59% bilaterally 10/2015.   Chronic diastolic CHF (congestive heart failure) ([place])    a. 05/2016 Echo: EF 60-65%, no rwma, Gr1 DD, Ao sclerosis w/o stenosis, triv MR;  b. 07/2016 TEE: EF 55-60%, no rwma, mild MR.   Chronic headaches    CKD (chronic kidney disease), stage III (HCC)    Coronary artery disease    a. 02/2007 Persantine MV: low risk;  b. 11/2011 CABG x 3 (LIMA->LAD, VG->OM, VG->RCA);  c. 05/2016 MV: EF >65%, no isch/infarct, horiz ST dep in I, II, V5-V6.   Depression    Diverticulosis    Esophageal stricture    GERD (gastroesophageal reflux disease)    Hemorrhoids    Hiatal hernia    Hyperkalemia    a. ARB stopped due to this.   Hyperlipidemia    Hypertension    Hypertensive heart disease    Hypothyroidism    Major depressive disorder with anxious distress 09/05/2019   Mild cognitive impairment    a. seen by neurology.   Mild vascular neurocognitive disorder 09/05/2019   Myocardial infarction ([address]) 12/07/2011   PAF (paroxysmal atrial fibrillation) ([place])    a. post-op CABG.   Pain    RIGHT KNEE PAIN - TORN RIGHT MEDIAL MENISCUS   Paroxysmal atrial flutter ([place])    a. 07/2016 s/p TEE & DCCV;  b. 07/2016 Recurrent PAFlutter req initiation of amio & PPM in setting of tachy-brady;  c. CHA2DS2VASc = 7-->Xarelto 15 mg QD.   Pneumonia    "twice" (09/27/2018)   PONV (postoperative nausea and vomiting)    Presence of permanent cardiac pacemaker 08/12/2016   S/P CABG (coronary  artery bypass graft), 12/04/11 12/07/2011   LIMA to LAD, SVG to OM, SVG to RCA   Sinus bradycardia    a. not on BB due to this.   Skin cancer    "face" (09/27/2018)   Tachy-brady syndrome ([place])    a. 07/2016 Jxnl brady following DCCV, recurrent Aflutter-->amio + SJM 2272 [employer] MRI DC PPM (ser # [id number]).   Type II diabetes mellitus ([place])    Past Surgical History:  Procedure Laterality Date   ABDOMINAL HYSTERECTOMY  1980's   ANKLE FRACTURE SURGERY Right    "put pins both side right ankle"   BACK SURGERY  2006   "cyst growing near my spine"   CARDIAC CATHETERIZATION  12/02/2011   mild LV dysfunction with mod hypocontractility of mid-distal anterolateral wall; CAD w/ostial tapering of L Main with 50% diffuse ostial narrowing of LAD, 99% eccentric focal prox LAD stenosis followed by 70% prox LAD stenosis after 1st diag, 20% mid LAD narrowing; 80% ostial-to-prox L Cfx stenosis & 40-50% irregularity of RCA (Dr. Corky Downs)   CARDIOVERSION N/A 08/11/2016   Procedure: CARDIOVERSION;  Surgeon: Lelon Perla, MD;  Location: [facility];  Service: Cardiovascular;  Laterality: N/A;   CATARACT EXTRACTION W/ INTRAOCULAR LENS  IMPLANT, BILATERAL Bilateral ~ 2010   [place]  CORONARY ARTERY BYPASS GRAFT  12/04/2011   Procedure: CORONARY ARTERY BYPASS GRAFTING (CABG);  Surgeon: Tharon Aquas Adelene Idler, MD;  Location: [facility];  Service: Open Heart Surgery;  Laterality: N/A;  CABG x three,  using left internal mammary artery, and right leg greater saphenous vein harvested endoscopically   CORONARY STENT INTERVENTION N/A 09/27/2018   Procedure: CORONARY STENT INTERVENTION;  Surgeon: Troy Sine, MD;  Location: [facility];  Service: Cardiovascular;  Laterality: N/A;   DILATION AND CURETTAGE OF UTERUS     "a couple times"   EP IMPLANTABLE DEVICE N/A 08/12/2016   Procedure: Pacemaker Implant;  Surgeon: Will Meredith Leeds, MD;  Location: [facility];  Service: Cardiovascular;   Laterality: N/A;   ESOPHAGOGASTRODUODENOSCOPY (EGD) WITH ESOPHAGEAL DILATION     FRACTURE SURGERY     JOINT REPLACEMENT     KNEE ARTHROSCOPY WITH MEDIAL MENISECTOMY Right 07/02/2014   Procedure: RIGHT KNEE ARTHROSCOPY WITH PARTIAL MEDIAL MENISTECTOMY, ABRASION CONDROPLASTYU OF PATELLA,ABRASION CONDROPLASTY OF MEDIAL FEMEROL CONDYL, MICROFRACTURE OF MEDIAL FEMEROL CONDYL;  Surgeon: Tobi Bastos, MD;  Location: [facility];  Service: Orthopedics;  Laterality: Right;   LEFT HEART CATH AND CORS/GRAFTS ANGIOGRAPHY N/A 09/06/2018   Procedure: LEFT HEART CATH AND CORS/GRAFTS ANGIOGRAPHY;  Surgeon: Troy Sine, MD;  Location: [facility];  Service: Cardiovascular;  Laterality: N/A;   LEFT HEART CATHETERIZATION WITH CORONARY ANGIOGRAM N/A 12/02/2011   Procedure: LEFT HEART CATHETERIZATION WITH CORONARY ANGIOGRAM;  Surgeon: Troy Sine, MD;  [facility] C[facility] LAB;  Service: Cardiovascular;  Laterality: N/A;  Coronary angiogram, possible PCI   TEE WITHOUT CARDIOVERSION N/A 08/11/2016   Procedure: TRANSESOPHAGEAL ECHOCARDIOGRAM (TEE);  Surgeon: Lelon Perla, MD;  Location: [facility] END[facility];  Service: Cardiovascular;  Laterality: N/A;   TEE WITHOUT CARDIOVERSION N/A 05/18/2019   Procedure: TRANSESOPHAGEAL ECHOCARDIOGRAM (TEE);  Surgeon: [name], MD;  Location: [facility] ENDOSCOPY;  Service: Cardiovascular;  Laterality: N/A;   [place] Left ~ 2006   TRANSTHORACIC ECHOCARDIOGRAM  02/19/2013   EF 09-98%, grade 1 diastolic dysfunction; mildly thickend/calcified AV leaflets; mildly calcidied MV annulus; mild TR    Allergies  Allergies  Allergen Reactions   Clonidine Derivatives Other (See Comments)    Bradycardia and fatigue    Mango Flavor Hives   Sulfa Antibiotics Other (See Comments)    Unknown   Sulfa Antibiotics    Crestor [Rosuvastatin Calcium] Other (See Comments)    Other reaction(s): tired and weak   Epinephrine Other (See Comments)    Abnormal  feeling. Dental exam/injection of local w/ epi.   Hydralazine Other (See Comments)    Nausea/gi upset    Losartan Other (See Comments)    Hyperkalemia    Other Other (See Comments)    MANGO'S - WELTS ALL OVER    History of Present Illness     She has a PMH of essential hypertension, non-STEMI, presyncope, coronary artery disease, paroxysmal atrial flutter, chronic diastolic CHF, accelerated hypertension, tachybradycardia syndrome, sick sinus syndrome, paroxysmal atrial fibrillation, GERD, esophageal stricture, diabetic peripheral neuropathy, acute encephalopathy, CKD stage IV, memory loss, acute anemia, SIRS, and anemia and chronic kidney disease.   Ms. Celene Squibb was seen by Dr. Sallyanne Kuster on 05/22/2019, in preparation for discharge after a bout of bacteremia in the presence of cat scratch fever.  She underwent TEE that showed no evidence of endocarditis, and her PICC line was still in place for home antibiotics.  During this admission she was  also found to be anemic due to acute blood loss anemia (found to have intramuscular hemorrhage with extension into the left retroperitoneum), her hemoglobin had dipped to 5.4, she received 2 units PRBCs, 1 FFP and her hemoglobin at discharge was 10.6.  Her pacemaker was programmed to report any episodes of atrial arrhythmia greater than 6 minutes.  With plan to initiate Eliquis in the event of atrial arrhythmia.  Plavix was also held and 81 mg aspirin was resumed.  She had underwent CABGx3 in 2013, and PCI on 09/27/2018 with stenting SVG to OM however given that it was more than 8 months since the intervention Plavix was held.  She presented to the clinic 06/18/2019 with her daughter and stated that she had been doing well since she was discharged from the hospital on 05/22/2019.  She felt she was not quite back to her baseline as far as energy and strength were concerned.  Her daughter asked about reducing carvedilol 25 mg twice daily .  She stated she was back doing  mainly all of her daily activities.  Ms. Bisaillon was still living alone.  She denied any falls or gait instability.  She was seen in follow-up by Dr. Curt Bears 07/06/2021 during that time she continued to do well.  She denied symptoms of palpitations, chest pain, shortness of breath, orthopnea, lower extremity edema, presyncope and syncope.  She denied bleeding issues and neurologic sequela.  She was tolerating her medications well.  She did note continued intermittent chest discomfort.  She was able to do all of her daily activities without restrictions.  Her medications were continued.  Follow-up was planned for 12 months.  She was admitted to the hospital on 08/10/2021 and discharged on 08/11/2021.  She was admitted with chest discomfort.  Her troponins were 77 and 63.  Her troponins in December were actually higher at 110 and 102.  There were no EKG changes noted.  Her prior angiography was reviewed with the daughter and she was shown that she has distal LAD disease and distal circumflex/OM disease which were not amenable to PCI.  Her chest pain was exertional at times.  She rarely took nitroglycerin but did report that it did help.  The addition of Imdur to her medication regimen was reviewed and added at 30 mg daily.  Her echocardiogram 08/11/2021 showed an LVEF of 50-55% and G1 DD.  She presents the clinic today for follow-up evaluation with her daughter states she feels well.  She continues to live alone.  Her daughter manages her medications.  We reviewed her Imdur and amlodipine medication.  Her blood pressure initially today was 160/84.  She had just taken her medications prior to coming to her visit.  On recheck her blood pressure was 142/58.  She denies further episodes of chest discomfort.  She does report that she has had occasional nosebleeds.  She has been using nasal saline intermittently and her daughter reports that she has a hard time keeping constant pressure on the bridge of her nose.  We  will continue her current medication regimen, have her continue her heart healthy low-sodium diet, and follow-up as scheduled.   Today she denies chest pain, shortness of breath, lower extremity edema, fatigue, palpitations, melena, hematuria, hemoptysis, diaphoresis, weakness, presyncope, syncope, orthopnea, and PND.  Home Medications    Prior to Admission medications   Medication Sig Start Date End Date Taking? Authorizing Provider  acetaminophen (TYLENOL) 650 MG CR tablet Take 650 mg by mouth every 8 (eight) hours  as needed for pain.    [provider]  allopurinol (ZYLOPRIM) 100 MG tablet Take 100 mg by mouth daily.    [provider]  amLODipine (NORVASC) 5 MG tablet Take 1 tablet (5 mg total) by mouth daily. 08/12/21 09/11/21  Barb Merino, MD  aspirin EC 81 MG tablet Take 81 mg by mouth daily.    [provider]  carvedilol (COREG) 25 MG tablet Take 12.5 mg by mouth 2 (two) times daily.    [provider]  diclofenac Sodium (VOLTAREN) 1 % GEL Apply 2 g topically 2 (two) times daily as needed (pain).    [provider]  docusate sodium (COLACE) 100 MG capsule Take 100 mg by mouth 2 (two) times daily as needed for mild constipation or moderate constipation.    [provider]  FEROSUL 325 (65 Fe) MG tablet Take 325 mg by mouth daily. 04/21/21   [provider]  gabapentin (NEURONTIN) 300 MG capsule Take 1 capsule (300 mg total) by mouth at bedtime. 05/22/19   Roney Jaffe, MD  isosorbide mononitrate (IMDUR) 30 MG 24 hr tablet Take 1 tablet (30 mg total) by mouth daily. 08/12/21 09/11/21  Barb Merino, MD  LANTUS SOLOSTAR 100 UNIT/ML Solostar Pen Inject 26 Units into the skin in the morning. 05/06/21   [provider]  meclizine (ANTIVERT) 25 MG tablet Take 25 mg by mouth daily as needed for dizziness. For dizziness    [provider]  nitroGLYCERIN (NITROSTAT) 0.4 MG SL tablet Place 1 tablet (0.4 mg total)  under the tongue every 5 (five) minutes as needed for chest pain (max 3 doses). 02/11/20 05/11/20  Hilty, Nadean Corwin, MD  nitroGLYCERIN (NITROSTAT) 0.4 MG SL tablet Place 0.4 mg under the tongue every 5 (five) minutes as needed for chest pain.    [provider]  ondansetron (ZOFRAN-ODT) 4 MG disintegrating tablet Take 4 mg by mouth every 8 (eight) hours as needed for nausea or vomiting. 12/05/20   [provider]  pantoprazole (PROTONIX) 40 MG tablet Take 40 mg by mouth daily. 06/24/20   [provider]  Polyethyl Glycol-Propyl Glycol (SYSTANE OP) Place 1 drop into both eyes 2 (two) times daily as needed (dry eyes).    [provider]  rosuvastatin (CRESTOR) 10 MG tablet Take 10 mg by mouth at bedtime. 01/28/21   [provider]  SYNTHROID 100 MCG tablet Take 100 mcg by mouth every morning. 03/26/21   [provider]  torsemide (DEMADEX) 20 MG tablet Take 20 mg by mouth every other day. 10/24/20   [provider]    Family History    Family History  Problem Relation Age of Onset   Diabetes Mother    CVA Mother    Hypertension Mother    Heart disease Father    Hyperlipidemia Father    Breast cancer Sister    Breast cancer Sister        x 3   Heart disease Brother        x5; one with MI   Heart disease Sister        x3   Diabetes Sister        x3   Lung cancer Sister    Breast cancer Sister    Colon cancer Neg Hx    She indicated that her mother is deceased. She indicated that her father is deceased. She reported the following about one of her sisters: 35 brothers and sisters. She indicated that  the status of her brother is unknown. She indicated that her daughter is alive. She indicated that the status of her neg hx is unknown.  Social History    Social History   Socioeconomic History   Marital status: Widowed    Spouse name: Not on file   Number of children: 2   Years of education: 12   Highest education level: High  school graduate  Occupational History   Occupation: retired  Tobacco Use   Smoking status: Never   Smokeless tobacco: Never  Vaping Use   Vaping Use: Never used  Substance and Sexual Activity   Alcohol use: Not Currently    Comment: very occasional    Drug use: Never   Sexual activity: Not Currently  Other Topics Concern   Not on file  Social History Narrative   2 children, 1 daughter and 1 adopted son   Social Determinants of Health   Financial Resource Strain: Not on file  Food Insecurity: Not on file  Transportation Needs: Not on file  Physical Activity: Not on file  Stress: Not on file  Social Connections: Not on file  Intimate Partner Violence: Not on file     Review of Systems    General:  No chills, fever, night sweats or weight changes.  Cardiovascular:  No chest pain, dyspnea on exertion, edema, orthopnea, palpitations, paroxysmal nocturnal dyspnea. Dermatological: No rash, lesions/masses Respiratory: No cough, dyspnea Urologic: No hematuria, dysuria Abdominal:   No nausea, vomiting, diarrhea, bright red blood per rectum, melena, or hematemesis Neurologic:  No visual changes, wkns, changes in mental status. All other systems reviewed and are otherwise negative except as noted above.  Physical Exam    VS:  BP (!) 142/58   Pulse 66   Ht 5' (1.524 m)   Wt 126 lb 1.6 oz (57.2 kg)   SpO2 99%   BMI 24.63 kg/m  , BMI Body mass index is 24.63 kg/m. GEN: Well nourished, well developed, in no acute distress. HEENT: normal. Neck: Supple, no JVD, carotid bruits, or masses. Cardiac: RRR, no murmurs, rubs, or gallops. No clubbing, cyanosis, edema.  Radials/DP/PT 2+ and equal bilaterally.  Respiratory:  Respirations regular and unlabored, clear to auscultation bilaterally. GI: Soft, nontender, nondistended, BS + x 4. MS: no deformity or atrophy. Skin: warm and dry, no rash. Neuro:  Strength and sensation are intact. Psych: Normal affect.  Accessory Clinical  Findings    Recent Labs: 11/28/2020: TSH 3.37 08/10/2021: ALT 17 08/11/2021: BUN 39; Creatinine, Ser 2.04; Hemoglobin 9.4; Magnesium 2.0; Platelets 137; Potassium 4.6; Sodium 139   Recent Lipid Panel    Component Value Date/Time   CHOL 203 (H) 09/28/2018 0437   TRIG 320 (H) 09/28/2018 0437   HDL 25 (L) 09/28/2018 0437   CHOLHDL 8.1 09/28/2018 0437   VLDL 64 (H) 09/28/2018 0437   LDLCALC 114 (H) 09/28/2018 0437    ECG personally reviewed by me today-none today.  Echocardiogram 08/11/2021  IMPRESSIONS     1. Left ventricular ejection fraction, by estimation, is 50 to 55%. The  left ventricle has low normal function. The left ventricle demonstrates  global hypokinesis. Left ventricular diastolic parameters are consistent  with Grade I diastolic dysfunction  (impaired relaxation).   2. Right ventricular systolic function is mildly reduced. The right  ventricular size is normal. Tricuspid regurgitation signal is inadequate  for assessing PA pressure.   3. The mitral valve is normal in structure. Trivial mitral valve  regurgitation. No evidence of mitral  stenosis.   4. The aortic valve is tricuspid. Aortic valve regurgitation is not  visualized. No aortic stenosis is present.   5. The inferior vena cava is normal in size with greater than 50%  respiratory variability, suggesting right atrial pressure of 3 mmHg.  Echocardiogram 05/17/2019 IMPRESSIONS      1. The left ventricle has normal systolic function, with an ejection fraction of 55-60%. The cavity size was normal. Left ventricular diastolic Doppler parameters are consistent with pseudonormalization. Elevated mean left atrial pressure There is  abnormal septal motion consistent with post-operative status. No evidence of left ventricular regional wall motion abnormalities.  2. The right ventricle has mildly reduced systolic function. The cavity was normal. There is no increase in right ventricular wall thickness. Right  ventricular systolic pressure is mildly elevated with an estimated pressure of 44.0 mmHg.  3. Left atrial size was moderately dilated.  4. Mild thickening of the mitral valve leaflet. The MR jet is centrally-directed.  5. Tricuspid valve regurgitation is mild-moderate.  6. The aorta is normal in size and structure.   Cardiac catheterization 09/27/2018   Prior to intervention, the saphenous graft supplying the circumflex marginal vessel had a 99% ostial/proximal stenosis with thrombus, followed by 70% proximal stenosis on a bend in the vessel.  There was 30% mild irregularity in the midportion of the graft with mild ectasia in the distal aspect of the graft prior to insertion into the marginal vessel.   Following successful intervention with PTCA and insertion of synergy 2.25 x 12 mm stents both ostially and proximally, both lesions were reduced to 0%.  There was brisk TIMI-3 flow.  There was no evidence for dissection.   RECOMMENDATION: Continued medical therapy for concomitant CAD.  Recommend resumption of Xarelto tomorrow with plans for triple drug therapy for 1 month and then change to Plavix/Xarelto for at least 6 months with then resumption of aspirin/Xarelto.  Assessment & Plan   1.  NSTEMI/CAD-no chest pain today.  Recently admitted to the hospital 08/10/2021 and discharged on 08/11/2021.  Her troponins were mildly elevated.  Her previous angiography was reviewed with her daughter and her echocardiogram was repeated and showed LVEF 50-55% and G1 DD.  Medical management was recommended. Continue aspirin, rosuvastatin, nitroglycerin, amlodipine, Imdur Heart healthy low-sodium diet-salty 6 given Increase physical activity as tolerated  Paroxysmal atrial fibrillation-PPM for tachybradycardia syndrome 2017 Continue aspirin, carvedilol  Avoid triggers caffeine, chocolate, EtOH, dehydration etc.   Chronic diastolic CHF-echocardiogram 04/2019 LVEF 55 to 51%, diastolic dysfunction, left atrium  moderately dilated.  Weight today 126 pounds . Continue torsemide , carvedilol Low-sodium heart healthy diet Increase physical activity as tolerated   Disposition: Follow-up with Dr. Debara Pickett as scheduled.  Jossie Ng. Kelby Adell NP-C    08/27/2021, 11:18 AM [place] [address] Office [phone] Fax [phone]  Notice: This dictation was prepared with Dragon dictation along with smaller phrase technology. Any transcriptional errors that result from this process are unintentional and may not be corrected upon review.  I spent 13 minutes examining this patient, reviewing medications, and using patient centered shared decision making involving her cardiac care.  Prior to her visit I spent greater than 20 minutes reviewing her past medical history,  medications, and prior cardiac tests.

## 2021-08-27 ENCOUNTER — Encounter: Payer: Self-pay | Admitting: General Practice

## 2021-08-27 ENCOUNTER — Ambulatory Visit: Payer: Medicare Other | Admitting: General Practice

## 2021-08-27 ENCOUNTER — Other Ambulatory Visit: Payer: Self-pay

## 2021-08-27 VITALS — BP 142/58 | HR 66 | Ht 60.0 in | Wt 126.1 lb

## 2021-08-27 DIAGNOSIS — I5032 Chronic diastolic (congestive) heart failure: Secondary | ICD-10-CM | POA: Diagnosis not present

## 2021-08-27 DIAGNOSIS — I214 Non-ST elevation (NSTEMI) myocardial infarction: Secondary | ICD-10-CM | POA: Diagnosis not present

## 2021-08-27 DIAGNOSIS — I25709 Atherosclerosis of coronary artery bypass graft(s), unspecified, with unspecified angina pectoris: Secondary | ICD-10-CM

## 2021-08-27 DIAGNOSIS — I480 Paroxysmal atrial fibrillation: Secondary | ICD-10-CM

## 2021-08-27 NOTE — Patient Instructions (Signed)
Medication Instructions:  Your physician recommends that you continue on your current medications as directed. Please refer to the Current Medication list given to you today.  *If you need a refill on your cardiac medications before your next appointment, please call your pharmacy*  Lab Work: NONE ordered at this time of appointment   If you have labs (blood work) drawn today and your tests are completely normal, you will receive your results only by: [place] (if you have MyChart) OR A paper copy in the mail If you have any lab test that is abnormal or we need to change your treatment, we will call you to review the results.  Testing/Procedures: NONE ordered at this time of appointment   Follow-Up: At [facility], you and your health needs are our priority.  As part of our continuing mission to provide you with exceptional heart care, we have created designated Provider Care Teams.  These Care Teams include your primary Cardiologist (physician) and Advanced Practice Providers (APPs -  Physician Assistants and Nurse Practitioners) who all work together to provide you with the care you need, when you need it.  We recommend signing up for the patient portal called "MyChart".  Sign up information is provided on this After Visit Summary.  MyChart is used to connect with patients for Virtual Visits (Telemedicine).  Patients are able to view lab/test results, encounter notes, upcoming appointments, etc.  Non-urgent messages can be sent to your provider as well.   To learn more about what you can do with MyChart, go to [email or web address].    Your next appointment:   As scheduled    The format for your next appointment:   In Person  Provider:   K. Mali Hilty, MD  Other Instructions

## 2021-09-11 ENCOUNTER — Other Ambulatory Visit: Payer: Self-pay

## 2021-09-14 ENCOUNTER — Telehealth: Payer: Self-pay | Admitting: Internal Medicine

## 2021-09-14 ENCOUNTER — Other Ambulatory Visit: Payer: Self-pay

## 2021-09-14 MED ORDER — CARVEDILOL 25 MG PO TABS
12.5000 mg | ORAL_TABLET | Freq: Two times a day (BID) | ORAL | 1 refills | Status: DC
Start: 2021-09-14 — End: 2022-02-12

## 2021-09-14 MED ORDER — AMLODIPINE BESYLATE 5 MG PO TABS: 5.0000 mg | ORAL_TABLET | Freq: Every day | ORAL | 2 refills | Status: DC

## 2021-09-14 MED ORDER — ISOSORBIDE MONONITRATE ER 30 MG PO TB24: 30.0000 mg | ORAL_TABLET | Freq: Every day | ORAL | 0 refills | Status: DC

## 2021-09-14 NOTE — Telephone Encounter (Signed)
RX sent to pharmacy-  Patient just recently seen.Marland Kitchen and appointment scheduled for 12/01

## 2021-09-14 NOTE — Telephone Encounter (Signed)
*  STAT* If patient is at the pharmacy, call can be transferred to refill team.   1. Which medications need to be refilled? (please list name of each medication and dose if known)  amLODipine (NORVASC) 5 MG tablet (Expired);  carvedilol (COREG) 25 MG tablet    2. Which pharmacy/location (including street and city if local pharmacy) is medication to be sent to?  [place], [place] [address]  3. Do they need a 30 day or 90 day supply? 90 for both

## 2021-09-24 ENCOUNTER — Encounter: Payer: Self-pay | Admitting: Internal Medicine

## 2021-09-24 ENCOUNTER — Ambulatory Visit: Payer: Medicare Other | Admitting: Internal Medicine

## 2021-09-24 ENCOUNTER — Other Ambulatory Visit: Payer: Self-pay

## 2021-09-24 VITALS — BP 126/58 | HR 64 | Ht 60.0 in | Wt 130.4 lb

## 2021-09-24 DIAGNOSIS — Z794 Long term (current) use of insulin: Secondary | ICD-10-CM | POA: Diagnosis not present

## 2021-09-24 DIAGNOSIS — I480 Paroxysmal atrial fibrillation: Secondary | ICD-10-CM | POA: Diagnosis not present

## 2021-09-24 DIAGNOSIS — I10 Essential (primary) hypertension: Secondary | ICD-10-CM | POA: Diagnosis not present

## 2021-09-24 DIAGNOSIS — Z0000 Encounter for general adult medical examination without abnormal findings: Secondary | ICD-10-CM | POA: Diagnosis not present

## 2021-09-24 DIAGNOSIS — E785 Hyperlipidemia, unspecified: Secondary | ICD-10-CM | POA: Diagnosis not present

## 2021-09-24 DIAGNOSIS — I5032 Chronic diastolic (congestive) heart failure: Secondary | ICD-10-CM

## 2021-09-24 DIAGNOSIS — I214 Non-ST elevation (NSTEMI) myocardial infarction: Secondary | ICD-10-CM

## 2021-09-24 DIAGNOSIS — Z950 Presence of cardiac pacemaker: Secondary | ICD-10-CM

## 2021-09-24 DIAGNOSIS — E039 Hypothyroidism, unspecified: Secondary | ICD-10-CM | POA: Diagnosis not present

## 2021-09-24 DIAGNOSIS — E1142 Type 2 diabetes mellitus with diabetic polyneuropathy: Secondary | ICD-10-CM | POA: Diagnosis not present

## 2021-09-24 DIAGNOSIS — N184 Chronic kidney disease, stage 4 (severe): Secondary | ICD-10-CM | POA: Diagnosis not present

## 2021-09-24 DIAGNOSIS — I2510 Atherosclerotic heart disease of native coronary artery without angina pectoris: Secondary | ICD-10-CM | POA: Diagnosis not present

## 2021-09-24 DIAGNOSIS — M8580 Other specified disorders of bone density and structure, unspecified site: Secondary | ICD-10-CM | POA: Diagnosis not present

## 2021-09-24 NOTE — Patient Instructions (Signed)
Medication Instructions:  Your physician recommends that you continue on your current medications as directed. Please refer to the Current Medication list given to you today.  *If you need a refill on your cardiac medications before your next appointment, please call your pharmacy*   Follow-Up: At [facility], you and your health needs are our priority.  As part of our continuing mission to provide you with exceptional heart care, we have created designated Provider Care Teams.  These Care Teams include your primary Cardiologist (physician) and Advanced Practice Providers (APPs -  Physician Assistants and Nurse Practitioners) who all work together to provide you with the care you need, when you need it.  We recommend signing up for the patient portal called "MyChart".  Sign up information is provided on this After Visit Summary.  MyChart is used to connect with patients for Virtual Visits (Telemedicine).  Patients are able to view lab/test results, encounter notes, upcoming appointments, etc.  Non-urgent messages can be sent to your provider as well.   To learn more about what you can do with MyChart, go to [email or web address].    Your next appointment:   6 month(s)  The format for your next appointment:   In Person  Provider:   Kenneth C Hilty, MD {  

## 2021-09-24 NOTE — Progress Notes (Signed)
OFFICE NOTE  Chief Complaint:  Follow-up  Primary Care Physician: [name] Cha, MD  HPI:  [name] is a pleasant 80 year old female with history of coronary disease and CABG x 3 vessels in 2013, LIMA to LAD, SVG to OM and SVG to right coronary. She underwent an echocardiogram which demonstrates normal EF of 55% to 65%. However, there is stage 1 diastolic dysfunction and evidence for elevated mean left atrial filling pressure. There were mildly thickened calcified aortic valve leaflets but no stenosis and mild tricuspid regurgitation. At the time, I started her on Lasix 20 mg daily. A BMP was obtained that day which indicated mild elevation at 75.5, creatinine was 1.16. She has been taking the Lasix with improvement in her lower extremity swelling although shortness of breath seems to be about the same. Again, she notes it is only with marked exertion, not necessarily with normal activities. However, she does have ongoing fatigue and easy fatigability. This may be due to just poor exercise tolerance. She also reported that recently you started her on a new SLGT2 diabetic medicine and she said that she took several doses of that, which I believe was Invokana, and she became markedly dizzy with that. She has since stopped that medication. Again, overall the patient is fairly stable with a normal ejection fraction. She recently had bypass and is not having any anginal symptoms and therefore I do not suspect that this is an issue related to her bypass grafts.   I saw [name] today in the office for an acute visit regarding what is described as abdominal pain. She reports a numbness and pain sensation over the right upper quadrant. She's also noted some firmness and distention of her abdomen. This is recently presented to a GI doctor in [place] who thought she might have a gastric ulcer. He performed endoscopy which was a reportedly unrevealing. He also recommended she is take MiraLAX  for constipation. She says that recently she's been constipated but also has been having some small amount of loose stool. She did not want to take the MiraLAX given the loose stool. She also reports some of the discomfort extends across the mid epigastrium and into the lower chest. It is entirely atypical for cardiac chest pain. She reports that she had a colonoscopy in the past by Dr. Henrene Walker about 5 years ago and a repeat colonoscopy was recommended around 2018. She also tells me that she recently had a right upper quadrant ultrasound performed at [facility] after presenting with abdominal pain which was negative for acute biliary disease.  [name] returns today for follow-up. Overall she seems to be doing well from a cardiac standpoint. I understand she is switched primary care providers to [facility] primary care, however she is unclear of her primary care provider. She does have an upcoming appointment to see [name] neurology. Recently she's been having trouble with forgetfulness and it seems to be getting worse. She tells me she lost her cell phone at home and has not been able to locate it. She denies any chest pain or shortness of breath and in fact her flank back and abdominal pain which was evaluated extensively has now gone away.  I had the pleasure of seeing [name] back today in follow-up. It's been 6 months and she'll last appointment. Her main concern today is leg weakness. She reports she has some back pain and associated weakness in her legs that give out from time to  time. She recently had memory testing indicating possible dementia as well as likely depression. It was recommended that she go on therapy however she declined medications. She denies any chest pain or worsening shortness of breath.  07/01/2016  [name] returns today for follow-up. She was recently hospitalized for acute diastolic heart failure. She was treated with diuretics and adjustments are  made to her blood pressure medications. She had a repeat echocardiogram which was essentially stable showed normal LV EF and diastolic dysfunction. After discharge she underwent nuclear stress testing which was negative for ischemia. She saw [name] Bayley, NP, who made some changes to her blood pressure medications including placing her on clonidine. Since taking this medicine she has had some of the typical symptoms of fatigue and dry mouth as well as constipation. She was alert initially taking clonidine 2 mg twice daily. She then decreased it to one half tablet or 1 mg twice daily but blood pressure remains high. I checked her blood pressure manually today 160/60. She reports fatigue and low energy.  09/22/2016  [name] returns today for hospital follow-up. 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 She had a St. Jude surety MRI safe pacemaker placed. She was also started on amiodarone and underwent cardioversion. She seems to be maintaining sinus rhythm. She reports improvement in her energy. She recently saw [name] Bayley, NP, in follow-up who reported she was doing well although noted her blood pressure was more elevated. She was placed on doxazosin 4 mg daily and had initial improvement in blood pressure however blood pressure has since gone up. Today was 186/62. Unfortunately, she's been intolerant of a number of blood pressure medications in the past.  12/23/2016  [name] was seen in follow-up today. She saw Dr. candidates in January who noted that she reported being shaky on amiodarone and he decreased the dose down to 100 mg daily. She remains in an AV paced rhythm but there has been recurrent underlying atrial flutter. She is also had some fatigue. The pressure is elevated today 156/70. I reviewed home monitoring indicates blood pressures continue to be elevated. She seems to be okay with these numbers but she is at increased  risk for events. Unfortunate she's had intolerance to number of medications.  04/22/2017  Mrs. Erichsen returns today for follow-up. She recently saw Almyra Deforest, PA-C, who noted that she had had some weight gain and lower extremity edema. He discontinued her chlorthalidone and put her on low-dose Lasix. She's had improvement in her swallowing with this and some weight loss. She then followed up with Dr. Moshe Cipro at [facility]. Her creatinine was noted to be 2.7 and she is undergoing a renal ultrasound and further workup for this. She was also noted to be anemic with a low iron saturation. She's been receiving some IV iron. She seems to be maintaining sinus rhythm on low-dose amiodarone. Recently she saw her endocrinologist who had increased her Synthroid. Her last TSH was between 7 and 8. She is due for repeat check of that. She has done well with the placement of a pacemaker in the fall. She has remote checks to follow that.  08/25/2017  [name] was seen today in follow-up.  Recently she had about 12 pound weight gain and saw her nephrologist.  She was started back on Lasix and was noted to have an elevated creatinine now back up to 2.14.  In addition her blood pressure was running low, specifically her  diastolic blood pressure and her amlodipine was decreased to 5 mg daily.  She seems to be maintaining sinus rhythm and is on low-dose amiodarone.  She is in a paced rhythm today and recent remote check showed no real evidence of atrial fibrillation.  She was on Xarelto however stopped that on her own recently because of fatigue and weakness.  She was also noted to be anemic and has been started on iron.  This begs a question of whether she may be having microscopic bleeding or whether her anemia was related to chronic kidney disease.  Nevertheless, she has an appropriately elevated chads vascular score and should be anticoagulated on Xarelto.  10/26/2017  [name] returns today for follow-up.   She again is gained about 5 or 6 pounds.  Apparently she had seen her nephrologist and was started back on Lasix however her creatinine is rising.  I repeated labs on October 10, 2017 which showed a creatinine had increased up to 2.24, previously 1.9 and 2.45.  Her pro-BNP was elevated at 1437.  Since that time she has had some worsening shortness of breath, lower extremity swelling and increasing abdominal girth.  Blood pressure is quite elevated today 190/70, however she says is been lower at home.  06/12/2018  [name] is again seen today in follow-up.  She has had some increased abdominal girth but no significant swelling or edema.  Remote pacemaker checks have been performed which showed normal battery life and adequate pacer function.  She is noted today to be in an atrial paced rhythm with prolonged AV conduction at 60.  She denies any chest pain or worsening shortness of breath. She was recently seen by Dr. Moshe Cipro at [facility].  It is felt that her creatinine has been somewhat stable.  11/27/2018  [name] seen today for follow-up of left heart catheterization.  This occurred in December after having symptoms of progressive shortness of breath and chest tightness.  She did have a stress test in November 2019 which showed a new area of ischemia in the OM territory.  She underwent left heart catheterization which showed high-grade graft disease with 95% ostial stenosis in the graft to the marginal vessel which likely accounted for the nuclear study changes.  However, given her chronic kidney disease stage PCI was recommended.  She went back for PCI in December 2019 which was successful.  Since then she is done fairly well.  Immediately had some improvement in shortness of breath and discomfort however has had some recurrent shortness of breath.  She has had some improvement in dyslipidemia now after restarting rosuvastatin 5 mg daily however LDL remains elevated at 114.  She previously  could not tolerate higher doses of statins however I am not sure as she has been on high potency rosuvastatin.  She also remains on Plavix and Xarelto which I think may be a lifelong combination for her.  Also, her blood pressure has been running little higher after stopping her amlodipine recently because of lower blood pressure.  08/15/2019  [name] is seen today in follow-up with her daughter.  Overall she continues to do well without recurrent chest pain after PCI last year.  She is on Plavix and Xarelto and denies any significant bleeding issues on that.  She also takes daily torsemide.  EKG shows an atrial paced rhythm today.  He recently has had some hearing difficulties but that is being addressed.  Blood pressure is at times labile however has been reasonably  well controlled.  Although the systolics a little higher she does have a wide pulse pressure and I would be hesitant to be real aggressive about her treatment as to not lower her diastolics any further.  Today her diastolic pressure was 48.  01/04/2020  [name] returns today for follow-up.  She is again accompanied by her daughter.  After some discussion she reports she has had some progressive dyspnea on exertion.  This is probably been over the past several months however to her daughter is much more noticeable.  She gets some pressure in her chest but no pain.  This is associated with exertion and relieved at rest.  02/11/2020  [name] returns today for follow-up of her Myoview stress test.  She is again accompanied by her daughter.  The Myoview stress test indicated a moderate sized partially reversible perfusion defect of the apical inferior, apical lateral and apex areas.  It was thought that this might be attenuation artifact.  LVEF was 47%.  In comparison with the prior study, this appears to be fairly stable if not slightly improved.  To my eye, there is less ischemia.  She now reports that her symptoms have improved  somewhat as well.  04/09/2021  [name] returns today for follow-up.  She denies any worsening cardiac sounding chest pain.  Blood pressure was elevated today 162/60.  She has been undergoing some vestibular physical therapy.  Seen by nephrology and had an episode where she was admitted with some degree of dehydration.  Her creatinine baseline now is between 2 and 2.5.  She does report some persistent shortness of breath particularly with exertion however says that she is mostly inactive.  Lipid profile in May showed total cholesterol 171, HDL 40, triglycerides 235 and LDL of 92.  EKG shows a paced rhythm with prolonged AV conduction and lateral ST and T wave changes at 60.  Remote pacer checks have been unrevealing.  09/24/2021  [name] is seen today in follow-up with her granddaughter.  She was recently in the hospital in October with chest pain.  She had a small NSTEMI.  Echo showed no change in LV function.  She was seen by Dr. Johnsie Cancel.  He reviewed her prior cath films and felt like she had had a history of some distal vessel disease however that was several years ago.  Nonetheless she was started on amlodipine and some nitrate.  Chest pain has resolved.  She does get some occasional dizziness but blood pressure does not appear to be too low.  PMHx:  Past Medical History:  Diagnosis Date   Anemia    Anxiety    Arthritis    "in my hands; knees, back" (09/27/2018)   CAD (coronary artery disease)    a. 40-59% bilaterally 10/2015.   Chronic diastolic CHF (congestive heart failure) ([place])    a. 05/2016 Echo: EF 60-65%, no rwma, Gr1 DD, Ao sclerosis w/o stenosis, triv MR;  b. 07/2016 TEE: EF 55-60%, no rwma, mild MR.   Chronic headaches    CKD (chronic kidney disease), stage III (HCC)    Coronary artery disease    a. 02/2007 Persantine MV: low risk;  b. 11/2011 CABG x 3 (LIMA->LAD, VG->OM, VG->RCA);  c. 05/2016 MV: EF >65%, no isch/infarct, horiz ST dep in I, II, V5-V6.   Depression     Diverticulosis    Esophageal stricture    GERD (gastroesophageal reflux disease)    Hemorrhoids    Hiatal hernia  Hyperkalemia    a. ARB stopped due to this.   Hyperlipidemia    Hypertension    Hypertensive heart disease    Hypothyroidism    Major depressive disorder with anxious distress 09/05/2019   Mild cognitive impairment    a. seen by neurology.   Mild vascular neurocognitive disorder 09/05/2019   Myocardial infarction ([place]) 12/07/2011   PAF (paroxysmal atrial fibrillation) ([place])    a. post-op CABG.   Pain    RIGHT KNEE PAIN - TORN RIGHT MEDIAL MENISCUS   Paroxysmal atrial flutter ([place])    a. 07/2016 s/p TEE & DCCV;  b. 07/2016 Recurrent PAFlutter req initiation of amio & PPM in setting of tachy-brady;  c. CHA2DS2VASc = 7-->Xarelto 15 mg QD.   Pneumonia    "twice" (09/27/2018)   PONV (postoperative nausea and vomiting)    Presence of permanent cardiac pacemaker 08/12/2016   S/P CABG (coronary artery bypass graft), 12/04/11 12/07/2011   LIMA to LAD, SVG to OM, SVG to RCA   Sinus bradycardia    a. not on BB due to this.   Skin cancer    "face" (09/27/2018)   Tachy-brady syndrome ([place])    a. 07/2016 Jxnl brady following DCCV, recurrent Aflutter-->amio + SJM 2272 Assurity MRI DC PPM (ser # [id number]).   Type II diabetes mellitus ([place])     Past Surgical History:  Procedure Laterality Date   ABDOMINAL HYSTERECTOMY  1980's   ANKLE FRACTURE SURGERY Right    "put pins both side right ankle"   BACK SURGERY  2006   "cyst growing near my spine"   CARDIAC CATHETERIZATION  12/02/2011   mild LV dysfunction with mod hypocontractility of mid-distal anterolateral wall; CAD w/ostial tapering of L [place] with 50% diffuse ostial narrowing of LAD, 99% eccentric focal prox LAD stenosis followed by 70% prox LAD stenosis after 1st diag, 20% mid LAD narrowing; 80% ostial-to-prox L Cfx stenosis & 40-50% irregularity of RCA (Dr. Corky Downs)   CARDIOVERSION N/A 08/11/2016   Procedure: CARDIOVERSION;   Surgeon: Lelon Perla, MD;  Location: [place] [facility];  Service: Cardiovascular;  Laterality: N/A;   CATARACT EXTRACTION W/ INTRAOCULAR LENS  IMPLANT, BILATERAL Bilateral ~ 2010   [place] GRAFT  12/04/2011   Procedure: CORONARY ARTERY BYPASS GRAFTING (CABG);  Surgeon: Tharon Aquas Adelene Idler, MD;  Location: [place];  Service: Open Heart Surgery;  Laterality: N/A;  CABG x three,  using left internal mammary artery, and right leg greater saphenous vein harvested endoscopically   CORONARY STENT INTERVENTION N/A 09/27/2018   Procedure: CORONARY STENT INTERVENTION;  Surgeon: Troy Sine, MD;  Location: [place] CV LAB;  Service: Cardiovascular;  Laterality: N/A;   DILATION AND CURETTAGE OF UTERUS     "a couple times"   EP IMPLANTABLE DEVICE N/A 08/12/2016   Procedure: Pacemaker Implant;  Surgeon: Will Meredith Leeds, MD;  Location: [place] [facility];  Service: Cardiovascular;  Laterality: N/A;   ESOPHAGOGASTRODUODENOSCOPY (EGD) WITH ESOPHAGEAL DILATION     FRACTURE SURGERY     JOINT REPLACEMENT     KNEE ARTHROSCOPY WITH MEDIAL MENISECTOMY Right 07/02/2014   Procedure: RIGHT KNEE ARTHROSCOPY WITH PARTIAL MEDIAL MENISTECTOMY, ABRASION CONDROPLASTYU OF PATELLA,ABRASION CONDROPLASTY OF MEDIAL FEMEROL CONDYL, MICROFRACTURE OF MEDIAL FEMEROL CONDYL;  Surgeon: Tobi Bastos, MD;  Location: [facility];  Service: Orthopedics;  Laterality: Right;   LEFT HEART CATH AND CORS/GRAFTS ANGIOGRAPHY N/A 09/06/2018   Procedure: LEFT HEART CATH AND CORS/GRAFTS ANGIOGRAPHY;  Surgeon:  Troy Sine, MD;  Location: [place] CV LAB;  Service: Cardiovascular;  Laterality: N/A;   LEFT HEART CATHETERIZATION WITH CORONARY ANGIOGRAM N/A 12/02/2011   Procedure: LEFT HEART CATHETERIZATION WITH CORONARY ANGIOGRAM;  Surgeon: Troy Sine, MD;  Location: [facility] CATH LAB;  Service: Cardiovascular;  Laterality: N/A;  Coronary angiogram, possible PCI   TEE WITHOUT CARDIOVERSION N/A 08/11/2016    Procedure: TRANSESOPHAGEAL ECHOCARDIOGRAM (TEE);  Surgeon: Lelon Perla, MD;  Location: [facility] ENDOSCOPY;  Service: Cardiovascular;  Laterality: N/A;   TEE WITHOUT CARDIOVERSION N/A 05/18/2019   Procedure: TRANSESOPHAGEAL ECHOCARDIOGRAM (TEE);  Surgeon: [name], MD;  Location: [facility] ENDOSCOPY;  Service: Cardiovascular;  Laterality: N/A;   [place] Left ~ 2006   TRANSTHORACIC ECHOCARDIOGRAM  02/19/2013   EF 25-85%, grade 1 diastolic dysfunction; mildly thickend/calcified AV leaflets; mildly calcidied MV annulus; mild TR    FAMHx:  Family History  Problem Relation Age of Onset   Diabetes Mother    CVA Mother    Hypertension Mother    Heart disease Father    Hyperlipidemia Father    Breast cancer Sister    Breast cancer Sister        x 3   Heart disease Brother        x5; one with MI   Heart disease Sister        x3   Diabetes Sister        x3   Lung cancer Sister    Breast cancer Sister    Colon cancer Neg Hx     SOCHx:   reports that she has never smoked. She has never used smokeless tobacco. She reports that she does not currently use alcohol. She reports that she does not use drugs.  ALLERGIES:  Allergies  Allergen Reactions   Clonidine Derivatives Other (See Comments)    Bradycardia and fatigue    Mango Flavor Hives   Sulfa Antibiotics Other (See Comments)    Unknown   Sulfa Antibiotics    Crestor [Rosuvastatin Calcium] Other (See Comments)    Other reaction(s): tired and weak   Epinephrine Other (See Comments)    Abnormal feeling. Dental exam/injection of local w/ epi.   Hydralazine Other (See Comments)    Nausea/gi upset    Losartan Other (See Comments)    Hyperkalemia    Other Other (See Comments)    MANGO'S - WELTS ALL OVER    ROS: Pertinent items noted in HPI and remainder of comprehensive ROS otherwise negative.  HOME MEDS: Current Outpatient Medications  Medication Sig Dispense Refill   acetaminophen  (TYLENOL) 650 MG CR tablet Take 650 mg by mouth every 8 (eight) hours as needed for pain.     allopurinol (ZYLOPRIM) 100 MG tablet Take 100 mg by mouth daily.     amLODipine (NORVASC) 5 MG tablet Take 1 tablet (5 mg total) by mouth daily. 30 tablet 2   aspirin EC 81 MG tablet Take 81 mg by mouth daily.     carvedilol (COREG) 25 MG tablet Take 0.5 tablets (12.5 mg total) by mouth 2 (two) times daily. 180 tablet 1   diclofenac Sodium (VOLTAREN) 1 % GEL Apply 2 g topically 2 (two) times daily as needed (pain).     docusate sodium (COLACE) 100 MG capsule Take 100 mg by mouth 2 (two) times daily as needed for mild constipation or moderate constipation.     FEROSUL 325 (65 Fe) MG tablet Take 325 mg  by mouth daily.     gabapentin (NEURONTIN) 300 MG capsule Take 1 capsule (300 mg total) by mouth at bedtime. 30 capsule 2   isosorbide mononitrate (IMDUR) 30 MG 24 hr tablet Take 1 tablet (30 mg total) by mouth daily. 30 tablet 0   LANTUS SOLOSTAR 100 UNIT/ML Solostar Pen Inject 26 Units into the skin in the morning.     meclizine (ANTIVERT) 25 MG tablet Take 25 mg by mouth daily as needed for dizziness. For dizziness     nitroGLYCERIN (NITROSTAT) 0.4 MG SL tablet Place 0.4 mg under the tongue every 5 (five) minutes as needed for chest pain.     ondansetron (ZOFRAN-ODT) 4 MG disintegrating tablet Take 4 mg by mouth every 8 (eight) hours as needed for nausea or vomiting.     pantoprazole (PROTONIX) 40 MG tablet Take 40 mg by mouth daily.     Polyethyl Glycol-Propyl Glycol (SYSTANE OP) Place 1 drop into both eyes 2 (two) times daily as needed (dry eyes).     rosuvastatin (CRESTOR) 10 MG tablet Take 10 mg by mouth at bedtime.     SYNTHROID 100 MCG tablet Take 100 mcg by mouth every morning.     torsemide (DEMADEX) 20 MG tablet Take 20 mg by mouth every other day.     nitroGLYCERIN (NITROSTAT) 0.4 MG SL tablet Place 1 tablet (0.4 mg total) under the tongue every 5 (five) minutes as needed for chest pain (max 3  doses). 25 tablet 3   No current facility-administered medications for this visit.    LABS/IMAGING: No results found for this or any previous visit (from the past 48 hour(s)). No results found.  VITALS: BP (!) 126/58   Pulse 64   Ht 5' (1.524 m)   Wt 130 lb 6.4 oz (59.1 kg)   SpO2 96%   BMI 25.47 kg/m   EXAM: General appearance: alert and no distress Neck: no carotid bruit, no JVD, and thyroid not enlarged, symmetric, no tenderness/mass/nodules Lungs: clear to auscultation bilaterally Heart: regular rate and rhythm, S1, S2 normal, no murmur, click, rub or gallop Abdomen: soft, non-tender; bowel sounds normal; no masses,  no organomegaly Extremities: extremities normal, atraumatic, no cyanosis or edema Pulses: 2+ and symmetric Skin: Skin color, texture, turgor normal. No rashes or lesions Neurologic: Grossly normal  EKG: Atrial paced rhythm at 64-personally reviewed  ASSESSMENT: Recent NSTEMI-medically managed on Imdur and amlodipine Progressive DOE-low risk Myoview stress test (12/2019)-LVEF 47% Unstable angina with recent PCI to the SVG to marginal graft (2019) Coronary artery disease status post CABG x 3 (LIMA to LAD, SVG to Diag, SVG to RCA) in 2013 Paroxysmal atrial flutter-CHADSVASC score of 5 on Xarelto Tachybradycardia syndrome status post St. Jude permanent pacemaker (07/2016) Diabetes type 2 - now on insulin Hypertension Dyslipidemia Peripheral neuropathy - secondary to diabetes Progressive memory loss Low back pain and leg weakness  CKD 3  PLAN: 1.   [place] reports improvement in her chest pain although probably had a small NSTEMI.  Echo showed no change in LV function.  She was treated medically and has had no further angina.  Blood pressure appears to be well controlled if not low normal.  She reports some occasional dizziness.  I asked her daughter to monitor for too low of blood pressure at which point we might consider decreasing her amlodipine from 5 to  2.5 mg daily.  Plan follow-up in 6 months or sooner as necessary.  [name], MD, [facility], [place]  [facility] of the [facility]  [facility] Attending Cardiologist  Direct Dial: [phone]  Fax: [phone]  Website:  [email or web address] 09/24/2021, 2:05 PM

## 2021-10-07 ENCOUNTER — Other Ambulatory Visit: Payer: Self-pay | Admitting: Internal Medicine

## 2021-11-17 LAB — CUP PACEART REMOTE DEVICE CHECK
Battery Remaining Longevity: 59 mo
Battery Remaining Percentage: 51 %
Battery Voltage: 2.98 V
Brady Statistic AP VP Percent: 1 %
Brady Statistic AP VS Percent: 99 %
Brady Statistic AS VP Percent: 1 %
Brady Statistic AS VS Percent: 1 %
Brady Statistic RA Percent Paced: 99 %
Brady Statistic RV Percent Paced: 1 %
Date Time Interrogation Session: 20230124020013
Implantable Lead Implant Date: 20171019
Implantable Lead Implant Date: 20171019
Implantable Lead Location: 753859
Implantable Lead Location: 753860
Implantable Pulse Generator Implant Date: 20171019
Lead Channel Impedance Value: 380 Ohm
Lead Channel Impedance Value: 740 Ohm
Lead Channel Pacing Threshold Amplitude: 0.5 V
Lead Channel Pacing Threshold Amplitude: 0.75 V
Lead Channel Pacing Threshold Pulse Width: 0.4 ms
Lead Channel Pacing Threshold Pulse Width: 0.4 ms
Lead Channel Sensing Intrinsic Amplitude: 12 mV
Lead Channel Sensing Intrinsic Amplitude: 2.9 mV
Lead Channel Setting Pacing Amplitude: 2 V
Lead Channel Setting Pacing Amplitude: 2.5 V
Lead Channel Setting Pacing Pulse Width: 0.4 ms
Lead Channel Setting Sensing Sensitivity: 2 mV
Pulse Gen Model: 2272
Pulse Gen Serial Number: 3180458

## 2021-11-18 ENCOUNTER — Ambulatory Visit (INDEPENDENT_AMBULATORY_CARE_PROVIDER_SITE_OTHER): Payer: Medicare Other

## 2021-11-18 DIAGNOSIS — I495 Sick sinus syndrome: Secondary | ICD-10-CM | POA: Diagnosis not present

## 2021-11-27 NOTE — Progress Notes (Signed)
Remote pacemaker transmission.   

## 2021-12-09 DIAGNOSIS — H11153 Pinguecula, bilateral: Secondary | ICD-10-CM | POA: Diagnosis not present

## 2021-12-09 DIAGNOSIS — H43813 Vitreous degeneration, bilateral: Secondary | ICD-10-CM | POA: Diagnosis not present

## 2021-12-09 DIAGNOSIS — E119 Type 2 diabetes mellitus without complications: Secondary | ICD-10-CM | POA: Diagnosis not present

## 2021-12-09 DIAGNOSIS — Z961 Presence of intraocular lens: Secondary | ICD-10-CM | POA: Diagnosis not present

## 2021-12-09 DIAGNOSIS — H02833 Dermatochalasis of right eye, unspecified eyelid: Secondary | ICD-10-CM | POA: Diagnosis not present

## 2021-12-09 DIAGNOSIS — H40013 Open angle with borderline findings, low risk, bilateral: Secondary | ICD-10-CM | POA: Diagnosis not present

## 2021-12-09 DIAGNOSIS — H02836 Dermatochalasis of left eye, unspecified eyelid: Secondary | ICD-10-CM | POA: Diagnosis not present

## 2021-12-09 DIAGNOSIS — D3132 Benign neoplasm of left choroid: Secondary | ICD-10-CM | POA: Diagnosis not present

## 2021-12-24 DIAGNOSIS — E1142 Type 2 diabetes mellitus with diabetic polyneuropathy: Secondary | ICD-10-CM | POA: Diagnosis not present

## 2021-12-24 DIAGNOSIS — E039 Hypothyroidism, unspecified: Secondary | ICD-10-CM | POA: Diagnosis not present

## 2021-12-24 DIAGNOSIS — M8580 Other specified disorders of bone density and structure, unspecified site: Secondary | ICD-10-CM | POA: Diagnosis not present

## 2021-12-24 DIAGNOSIS — I10 Essential (primary) hypertension: Secondary | ICD-10-CM | POA: Diagnosis not present

## 2021-12-24 DIAGNOSIS — Z794 Long term (current) use of insulin: Secondary | ICD-10-CM | POA: Diagnosis not present

## 2021-12-24 DIAGNOSIS — E785 Hyperlipidemia, unspecified: Secondary | ICD-10-CM | POA: Diagnosis not present

## 2021-12-28 DIAGNOSIS — J324 Chronic pansinusitis: Secondary | ICD-10-CM | POA: Diagnosis not present

## 2022-01-03 ENCOUNTER — Emergency Department (HOSPITAL_COMMUNITY): Payer: Medicare Other

## 2022-01-03 ENCOUNTER — Inpatient Hospital Stay (HOSPITAL_COMMUNITY)
Admission: EM | Admit: 2022-01-03 | Discharge: 2022-01-09 | DRG: 193 | Disposition: A | Discharge status: Skilled Nursing Facility | Payer: Medicare Other | Attending: Internal Medicine | Admitting: Internal Medicine

## 2022-01-03 ENCOUNTER — Other Ambulatory Visit: Payer: Self-pay

## 2022-01-03 DIAGNOSIS — N184 Chronic kidney disease, stage 4 (severe): Secondary | ICD-10-CM | POA: Diagnosis present

## 2022-01-03 DIAGNOSIS — R0902 Hypoxemia: Secondary | ICD-10-CM | POA: Diagnosis not present

## 2022-01-03 DIAGNOSIS — I480 Paroxysmal atrial fibrillation: Secondary | ICD-10-CM | POA: Diagnosis not present

## 2022-01-03 DIAGNOSIS — Z794 Long term (current) use of insulin: Secondary | ICD-10-CM | POA: Diagnosis not present

## 2022-01-03 DIAGNOSIS — Z28311 Partially vaccinated for covid-19: Secondary | ICD-10-CM

## 2022-01-03 DIAGNOSIS — R0689 Other abnormalities of breathing: Secondary | ICD-10-CM | POA: Diagnosis not present

## 2022-01-03 DIAGNOSIS — Z951 Presence of aortocoronary bypass graft: Secondary | ICD-10-CM

## 2022-01-03 DIAGNOSIS — R0789 Other chest pain: Secondary | ICD-10-CM | POA: Diagnosis not present

## 2022-01-03 DIAGNOSIS — Z79899 Other long term (current) drug therapy: Secondary | ICD-10-CM

## 2022-01-03 DIAGNOSIS — E1122 Type 2 diabetes mellitus with diabetic chronic kidney disease: Secondary | ICD-10-CM | POA: Diagnosis not present

## 2022-01-03 DIAGNOSIS — I130 Hypertensive heart and chronic kidney disease with heart failure and stage 1 through stage 4 chronic kidney disease, or unspecified chronic kidney disease: Secondary | ICD-10-CM | POA: Diagnosis present

## 2022-01-03 DIAGNOSIS — I2510 Atherosclerotic heart disease of native coronary artery without angina pectoris: Secondary | ICD-10-CM | POA: Diagnosis not present

## 2022-01-03 DIAGNOSIS — K219 Gastro-esophageal reflux disease without esophagitis: Secondary | ICD-10-CM | POA: Diagnosis present

## 2022-01-03 DIAGNOSIS — I495 Sick sinus syndrome: Secondary | ICD-10-CM | POA: Diagnosis present

## 2022-01-03 DIAGNOSIS — I5033 Acute on chronic diastolic (congestive) heart failure: Secondary | ICD-10-CM | POA: Diagnosis not present

## 2022-01-03 DIAGNOSIS — D649 Anemia, unspecified: Secondary | ICD-10-CM | POA: Diagnosis not present

## 2022-01-03 DIAGNOSIS — E11649 Type 2 diabetes mellitus with hypoglycemia without coma: Secondary | ICD-10-CM | POA: Diagnosis not present

## 2022-01-03 DIAGNOSIS — Z96652 Presence of left artificial knee joint: Secondary | ICD-10-CM | POA: Diagnosis present

## 2022-01-03 DIAGNOSIS — E785 Hyperlipidemia, unspecified: Secondary | ICD-10-CM | POA: Diagnosis not present

## 2022-01-03 DIAGNOSIS — E1159 Type 2 diabetes mellitus with other circulatory complications: Secondary | ICD-10-CM | POA: Diagnosis present

## 2022-01-03 DIAGNOSIS — Z950 Presence of cardiac pacemaker: Secondary | ICD-10-CM | POA: Diagnosis not present

## 2022-01-03 DIAGNOSIS — E039 Hypothyroidism, unspecified: Secondary | ICD-10-CM | POA: Diagnosis present

## 2022-01-03 DIAGNOSIS — R140 Abdominal distension (gaseous): Secondary | ICD-10-CM | POA: Diagnosis not present

## 2022-01-03 DIAGNOSIS — Z7982 Long term (current) use of aspirin: Secondary | ICD-10-CM

## 2022-01-03 DIAGNOSIS — J189 Pneumonia, unspecified organism: Secondary | ICD-10-CM | POA: Diagnosis present

## 2022-01-03 DIAGNOSIS — Z8249 Family history of ischemic heart disease and other diseases of the circulatory system: Secondary | ICD-10-CM

## 2022-01-03 DIAGNOSIS — R0602 Shortness of breath: Secondary | ICD-10-CM

## 2022-01-03 DIAGNOSIS — I252 Old myocardial infarction: Secondary | ICD-10-CM

## 2022-01-03 DIAGNOSIS — Z955 Presence of coronary angioplasty implant and graft: Secondary | ICD-10-CM

## 2022-01-03 DIAGNOSIS — N179 Acute kidney failure, unspecified: Secondary | ICD-10-CM | POA: Diagnosis present

## 2022-01-03 DIAGNOSIS — R040 Epistaxis: Secondary | ICD-10-CM | POA: Diagnosis not present

## 2022-01-03 DIAGNOSIS — I248 Other forms of acute ischemic heart disease: Secondary | ICD-10-CM | POA: Diagnosis not present

## 2022-01-03 DIAGNOSIS — I152 Hypertension secondary to endocrine disorders: Secondary | ICD-10-CM | POA: Diagnosis present

## 2022-01-03 DIAGNOSIS — Z66 Do not resuscitate: Secondary | ICD-10-CM | POA: Diagnosis not present

## 2022-01-03 DIAGNOSIS — Z803 Family history of malignant neoplasm of breast: Secondary | ICD-10-CM

## 2022-01-03 DIAGNOSIS — R079 Chest pain, unspecified: Secondary | ICD-10-CM

## 2022-01-03 DIAGNOSIS — R0609 Other forms of dyspnea: Secondary | ICD-10-CM | POA: Diagnosis not present

## 2022-01-03 DIAGNOSIS — J9601 Acute respiratory failure with hypoxia: Secondary | ICD-10-CM | POA: Diagnosis present

## 2022-01-03 DIAGNOSIS — I517 Cardiomegaly: Secondary | ICD-10-CM | POA: Diagnosis not present

## 2022-01-03 DIAGNOSIS — R0781 Pleurodynia: Secondary | ICD-10-CM | POA: Diagnosis not present

## 2022-01-03 DIAGNOSIS — I4892 Unspecified atrial flutter: Secondary | ICD-10-CM | POA: Diagnosis not present

## 2022-01-03 DIAGNOSIS — G8929 Other chronic pain: Secondary | ICD-10-CM | POA: Diagnosis not present

## 2022-01-03 DIAGNOSIS — E1169 Type 2 diabetes mellitus with other specified complication: Secondary | ICD-10-CM | POA: Diagnosis not present

## 2022-01-03 DIAGNOSIS — E1165 Type 2 diabetes mellitus with hyperglycemia: Secondary | ICD-10-CM | POA: Diagnosis present

## 2022-01-03 DIAGNOSIS — Z20822 Contact with and (suspected) exposure to covid-19: Secondary | ICD-10-CM | POA: Diagnosis not present

## 2022-01-03 DIAGNOSIS — I5032 Chronic diastolic (congestive) heart failure: Secondary | ICD-10-CM

## 2022-01-03 DIAGNOSIS — E119 Type 2 diabetes mellitus without complications: Secondary | ICD-10-CM | POA: Diagnosis not present

## 2022-01-03 DIAGNOSIS — Z823 Family history of stroke: Secondary | ICD-10-CM

## 2022-01-03 DIAGNOSIS — Z743 Need for continuous supervision: Secondary | ICD-10-CM | POA: Diagnosis not present

## 2022-01-03 DIAGNOSIS — Z833 Family history of diabetes mellitus: Secondary | ICD-10-CM

## 2022-01-03 DIAGNOSIS — Z801 Family history of malignant neoplasm of trachea, bronchus and lung: Secondary | ICD-10-CM

## 2022-01-03 DIAGNOSIS — I959 Hypotension, unspecified: Secondary | ICD-10-CM | POA: Diagnosis not present

## 2022-01-03 DIAGNOSIS — Z83438 Family history of other disorder of lipoprotein metabolism and other lipidemia: Secondary | ICD-10-CM

## 2022-01-03 DIAGNOSIS — R918 Other nonspecific abnormal finding of lung field: Secondary | ICD-10-CM | POA: Diagnosis not present

## 2022-01-03 LAB — I-STAT VENOUS BLOOD GAS, ED
Acid-Base Excess: 0 mmol/L (ref 0.0–2.0)
Acid-Base Excess: 0 mmol/L (ref 0.0–2.0)
Bicarbonate: 25.7 mmol/L (ref 20.0–28.0)
Bicarbonate: 24.3 mmol/L (ref 20.0–28.0)
Calcium, Ion: 1.14 mmol/L — ABNORMAL LOW (ref 1.15–1.40)
Calcium, Ion: 1.15 mmol/L (ref 1.15–1.40)
HCT: 28 % — ABNORMAL LOW (ref 36.0–46.0)
HCT: 28 % — ABNORMAL LOW (ref 36.0–46.0)
Hemoglobin: 9.5 g/dL — ABNORMAL LOW (ref 12.0–15.0)
Hemoglobin: 9.5 g/dL — ABNORMAL LOW (ref 12.0–15.0)
O2 Saturation: 90 %
O2 Saturation: 71 %
Potassium: 4.4 mmol/L (ref 3.5–5.1)
Potassium: 4.3 mmol/L (ref 3.5–5.1)
Sodium: 140 mmol/L (ref 135–145)
Sodium: 141 mmol/L (ref 135–145)
TCO2: 27 mmol/L (ref 22–32)
TCO2: 25 mmol/L (ref 22–32)
pCO2, Ven: 45 mmHg (ref 44–60)
pCO2, Ven: 38.8 mmHg — ABNORMAL LOW (ref 44–60)
pH, Ven: 7.365 (ref 7.25–7.43)
pH, Ven: 7.405 (ref 7.25–7.43)
pO2, Ven: 61 mmHg — ABNORMAL HIGH (ref 32–45)
pO2, Ven: 37 mmHg (ref 32–45)

## 2022-01-03 LAB — COMPREHENSIVE METABOLIC PANEL
ALT: 29 U/L (ref 0–44)
AST: 35 U/L (ref 15–41)
Albumin: 3.7 g/dL (ref 3.5–5.0)
Alkaline Phosphatase: 83 U/L (ref 38–126)
Anion gap: 13 (ref 5–15)
BUN: 64 mg/dL — ABNORMAL HIGH (ref 8–23)
CO2: 22 mmol/L (ref 22–32)
Calcium: 8.8 mg/dL — ABNORMAL LOW (ref 8.9–10.3)
Chloride: 104 mmol/L (ref 98–111)
Creatinine, Ser: 2.43 mg/dL — ABNORMAL HIGH (ref 0.44–1.00)
GFR, Estimated: 19 mL/min — ABNORMAL LOW (ref >60)
Glucose, Bld: 131 mg/dL — ABNORMAL HIGH (ref 70–99)
Potassium: 4.4 mmol/L (ref 3.5–5.1)
Sodium: 139 mmol/L (ref 135–145)
Total Bilirubin: 0.9 mg/dL (ref 0.3–1.2)
Total Protein: 6.9 g/dL (ref 6.5–8.1)

## 2022-01-03 LAB — URINALYSIS, ROUTINE W REFLEX MICROSCOPIC
Bacteria, UA: NONE SEEN
Bilirubin Urine: NEGATIVE
Glucose, UA: NEGATIVE mg/dL
Hgb urine dipstick: NEGATIVE
Ketones, ur: NEGATIVE mg/dL
Leukocytes,Ua: NEGATIVE
Nitrite: NEGATIVE
Protein, ur: 100 mg/dL — AB
Specific Gravity, Urine: 1.01 (ref 1.005–1.030)
pH: 5 (ref 5.0–8.0)

## 2022-01-03 LAB — CBC
HCT: 28.5 % — ABNORMAL LOW (ref 36.0–46.0)
Hemoglobin: 9.5 g/dL — ABNORMAL LOW (ref 12.0–15.0)
MCH: 33.6 pg (ref 26.0–34.0)
MCHC: 33.3 g/dL (ref 30.0–36.0)
MCV: 100.7 fL — ABNORMAL HIGH (ref 80.0–100.0)
Platelets: 146 10*3/uL — ABNORMAL LOW (ref 150–400)
RBC: 2.83 MIL/uL — ABNORMAL LOW (ref 3.87–5.11)
RDW: 13.8 % (ref 11.5–15.5)
WBC: 12.9 10*3/uL — ABNORMAL HIGH (ref 4.0–10.5)
nRBC: 0 % (ref 0.0–0.2)

## 2022-01-03 LAB — TROPONIN I (HIGH SENSITIVITY)
Troponin I (High Sensitivity): 27 ng/L — ABNORMAL HIGH (ref <18)
Troponin I (High Sensitivity): 32 ng/L — ABNORMAL HIGH (ref <18)

## 2022-01-03 LAB — RESP PANEL BY RT-PCR (FLU A&B, COVID) ARPGX2
Influenza A by PCR: NEGATIVE
Influenza B by PCR: NEGATIVE
SARS Coronavirus 2 by RT PCR: NEGATIVE

## 2022-01-03 LAB — LACTIC ACID, PLASMA: Lactic Acid, Venous: 0.9 mmol/L (ref 0.5–1.9)

## 2022-01-03 MED ORDER — SODIUM CHLORIDE 0.9 % IV SOLN
1.0000 g | Freq: Once | INTRAVENOUS | Status: AC
  Administered 2022-01-03: 1 g via INTRAVENOUS
  Filled 2022-01-03: qty 10

## 2022-01-03 MED ORDER — SODIUM CHLORIDE 0.9 % IV SOLN
500.0000 mg | Freq: Once | INTRAVENOUS | Status: AC
  Administered 2022-01-03: 500 mg via INTRAVENOUS
  Filled 2022-01-03: qty 5

## 2022-01-03 MED ORDER — ASPIRIN 325 MG PO TABS
325.0000 mg | ORAL_TABLET | Freq: Every day | ORAL | Status: DC
  Administered 2022-01-03: 325 mg via ORAL
  Filled 2022-01-03: qty 1

## 2022-01-03 MED ORDER — LACTATED RINGERS IV BOLUS
500.0000 mL | Freq: Once | INTRAVENOUS | Status: AC
  Administered 2022-01-04: 500 mL via INTRAVENOUS

## 2022-01-03 MED ORDER — NITROGLYCERIN 0.4 MG SL SUBL: 0.4000 mg | SUBLINGUAL_TABLET | SUBLINGUAL | Status: DC | PRN

## 2022-01-03 MED ORDER — ENOXAPARIN SODIUM 40 MG/0.4ML IJ SOSY
40.0000 mg | PREFILLED_SYRINGE | Freq: Every day | INTRAMUSCULAR | Status: DC
  Administered 2022-01-04: 40 mg via SUBCUTANEOUS
  Filled 2022-01-03: qty 0.4

## 2022-01-03 MED ORDER — IPRATROPIUM-ALBUTEROL 0.5-2.5 (3) MG/3ML IN SOLN
3.0000 mL | RESPIRATORY_TRACT | Status: DC | PRN
  Administered 2022-01-03 – 2022-01-05 (×5): 3 mL via RESPIRATORY_TRACT
  Filled 2022-01-03 (×5): qty 3

## 2022-01-03 NOTE — ED Notes (Signed)
Patient changed, given new brief, and placed on purewick.  ?

## 2022-01-03 NOTE — H&P (Signed)
History and Physical    Patient: [name] ZOX:[id number] DOB: 1941-07-28 DOA: 01/03/2022 DOS: the patient was seen and examined on 01/04/2022 PCP: Lorenda Ishihara, MD  Patient coming from: Home  Chief Complaint:  Chief Complaint  Patient presents with   Shortness of Breath   HPI: [name] is a 81 y.o. female with medical history significant of paroxsymal atrial fibrillation, SSS s/p pacemaker, CAD s/p CABG x3, insulin-dependent T2DM, chronic diastolic HF, HTN, and HLD who presents with increasing shortness of breath.   Hx provided by daughter at bedside as pt has hearing impairment.  Pt reports being unable to sleep at night due to cough, chest tightness and increasing shortness of breath for the past few days. Pt lives alone but daughter goes to visit and has noticed her to be more weak and short of breath.   In the ED, she was bradycardia in the 50s, had oxygen saturation in the low 90s and was ultimately placed on 2 L via nasal cannula.  CBC with leukocytosis of 12.9 K, hemoglobin around 9.5 with baseline around 10.  Platelet of 146. Electrolytes are normal but creatinine is elevated to 2.43 with a baseline around 1 2.  CBG 131  Troponin mildly elevated at 27 with EKG on my review showing atrially paced rhythm.  Chest x-ray revealing for right lower and upper lobe pneumonia.     Review of Systems: unable to review all systems due to the inability of the patient to answer questions. Past Medical History:  Diagnosis Date   Anemia    Anxiety    Arthritis    "in my hands; knees, back" (09/27/2018)   CAD (coronary artery disease)    a. 40-59% bilaterally 10/2015.   Chronic diastolic CHF (congestive heart failure) (HCC)    a. 05/2016 Echo: EF 60-65%, no rwma, Gr1 DD, Ao sclerosis w/o stenosis, triv MR;  b. 07/2016 TEE: EF 55-60%, no rwma, mild MR.   Chronic headaches    CKD (chronic kidney disease), stage III (HCC)    Coronary artery disease    a. 02/2007  Persantine MV: low risk;  b. 11/2011 CABG x 3 (LIMA->LAD, VG->OM, VG->RCA);  c. 05/2016 MV: EF >65%, no isch/infarct, horiz ST dep in I, II, V5-V6.   Depression    Diverticulosis    Esophageal stricture    GERD (gastroesophageal reflux disease)    Hemorrhoids    Hiatal hernia    Hyperkalemia    a. ARB stopped due to this.   Hyperlipidemia    Hypertension    Hypertensive heart disease    Hypothyroidism    Major depressive disorder with anxious distress 09/05/2019   Mild cognitive impairment    a. seen by neurology.   Mild vascular neurocognitive disorder 09/05/2019   Myocardial infarction (HCC) 12/07/2011   PAF (paroxysmal atrial fibrillation) (HCC)    a. post-op CABG.   Pain    RIGHT KNEE PAIN - TORN RIGHT MEDIAL MENISCUS   Paroxysmal atrial flutter (HCC)    a. 07/2016 s/p TEE & DCCV;  b. 07/2016 Recurrent PAFlutter req initiation of amio & PPM in setting of tachy-brady;  c. CHA2DS2VASc = 7-->Xarelto 15 mg QD.   Pneumonia    "twice" (09/27/2018)   PONV (postoperative nausea and vomiting)    Presence of permanent cardiac pacemaker 08/12/2016   S/P CABG (coronary artery bypass graft), 12/04/11 12/07/2011   LIMA to LAD, SVG to OM, SVG to RCA   Sinus bradycardia    a. not  on BB due to this.   Skin cancer    "face" (09/27/2018)   Tachy-brady syndrome (HCC)    a. 07/2016 Jxnl brady following DCCV, recurrent Aflutter-->amio + SJM 2272 Assurity MRI DC PPM (ser # [id number]).   Type II diabetes mellitus (HCC)    Past Surgical History:  Procedure Laterality Date   ABDOMINAL HYSTERECTOMY  1980's   ANKLE FRACTURE SURGERY Right    "put pins both side right ankle"   BACK SURGERY  2006   "cyst growing near my spine"   CARDIAC CATHETERIZATION  12/02/2011   mild LV dysfunction with mod hypocontractility of mid-distal anterolateral wall; CAD w/ostial tapering of L Main with 50% diffuse ostial narrowing of LAD, 99% eccentric focal prox LAD stenosis followed by 70% prox LAD stenosis after 1st diag,  20% mid LAD narrowing; 80% ostial-to-prox L Cfx stenosis & 40-50% irregularity of RCA (Dr. Bishop Limbo)   CARDIOVERSION N/A 08/11/2016   Procedure: CARDIOVERSION;  Surgeon: Lewayne Bunting, MD;  Location: [facility] ENDOSCOPY;  Service: Cardiovascular;  Laterality: N/A;   CATARACT EXTRACTION W/ INTRAOCULAR LENS  IMPLANT, BILATERAL Bilateral ~ 2010   CESAREAN SECTION  1977   CORONARY ARTERY BYPASS GRAFT  12/04/2011   Procedure: CORONARY ARTERY BYPASS GRAFTING (CABG);  Surgeon: Kathlee Nations Suann Larry, MD;  Location: [facility] OR;  Service: Open Heart Surgery;  Laterality: N/A;  CABG x three,  using left internal mammary artery, and right leg greater saphenous vein harvested endoscopically   CORONARY STENT INTERVENTION N/A 09/27/2018   Procedure: CORONARY STENT INTERVENTION;  Surgeon: Lennette Bihari, MD;  Location: [facility] INVASIVE CV LAB;  Service: Cardiovascular;  Laterality: N/A;   DILATION AND CURETTAGE OF UTERUS     "a couple times"   EP IMPLANTABLE DEVICE N/A 08/12/2016   Procedure: Pacemaker Implant;  Surgeon: Will Jorja Loa, MD;  Location: [facility] INVASIVE CV LAB;  Service: Cardiovascular;  Laterality: N/A;   ESOPHAGOGASTRODUODENOSCOPY (EGD) WITH ESOPHAGEAL DILATION     FRACTURE SURGERY     JOINT REPLACEMENT     KNEE ARTHROSCOPY WITH MEDIAL MENISECTOMY Right 07/02/2014   Procedure: RIGHT KNEE ARTHROSCOPY WITH PARTIAL MEDIAL MENISTECTOMY, ABRASION CONDROPLASTYU OF PATELLA,ABRASION CONDROPLASTY OF MEDIAL FEMEROL CONDYL, MICROFRACTURE OF MEDIAL FEMEROL CONDYL;  Surgeon: Jacki Cones, MD;  Location: [facility] ORS;  Service: Orthopedics;  Laterality: Right;   LEFT HEART CATH AND CORS/GRAFTS ANGIOGRAPHY N/A 09/06/2018   Procedure: LEFT HEART CATH AND CORS/GRAFTS ANGIOGRAPHY;  Surgeon: Lennette Bihari, MD;  Location: [facility] INVAS[facility] CV LAB;  Service: Cardiovascular;  Laterality: N/A;   LEFT HEART CATHETERIZATION WITH CORONARY ANGIOGRAM N/A 12/02/2011   Procedure: LEFT HEART CATHETERIZATION WITH CORONARY ANGIOGRAM;  Surgeon: Lennette Bihari, MD;  Location: [facility] CATH LAB;  Service: Cardiovascular;  Laterality: N/A;  Coronary angiogram, possible PCI   TEE WITHOUT CARDIOVERSION N/A 08/11/2016   Procedure: TRANSESOPHAGEAL ECHOCARDIOGRAM (TEE);  Surgeon: Lewayne Bunting, MD;  Location[facility] ENDOSCOPY;  Service: Cardiovascular;  Laterality: N/A;   TEE WITHOUT CARDIOVERSION N/A 05/18/2019   Procedure: TRANSESOPHAGEAL ECHOCARDIOGRAM (TEE);  Surgeon: Chrystie Nose, MD;  Location: [facility] ENDOSCOPY;  Service: Cardiovascular;  Laterality: N/A;   TONSILLECTOMY  1949   TOTAL KNEE ARTHROPLASTY Left ~ 2006   TRANSTHORACIC ECHOCARDIOGRAM  02/19/2013   EF 55-60%, grade 1 diastolic dysfunction; mildly thickend/calcified AV leaflets; mildly calcidied MV annulus; mild TR   Social History:  reports that she has never smoked. She has never used smokeless tobacco. She reports that she does not currently use alcohol. She reports that  she does not use drugs.  Allergies  Allergen Reactions   Clonidine Derivatives Other (See Comments)    Bradycardia and fatigue    Clonidine Hcl Other (See Comments)    Bradycardia   Crestor [Rosuvastatin] Other (See Comments)    Made the patient feel tired/weak   Losartan Potassium Other (See Comments)    Hyperkalemia   Sulfa Antibiotics Other (See Comments)    Childhood reaction not recalled   Epinephrine Other (See Comments)    Abnormal feeling. Dental exam/injection of local w/ epi.   Hydralazine Hcl Anxiety and Other (See Comments)    Nervousness, anxiousness, GI upset    Family History  Problem Relation Age of Onset   Diabetes Mother    CVA Mother    Hypertension Mother    Heart disease Father    Hyperlipidemia Father    Breast cancer Sister    Breast cancer Sister        x 3   Heart disease Brother        x5; one with MI   Heart disease Sister        x3   Diabetes Sister        x3   Lung cancer Sister    Breast cancer Sister    Colon cancer Neg Hx     Prior to Admission medications    Medication Sig Start Date End Date Taking? Authorizing Provider  acetaminophen (TYLENOL) 650 MG CR tablet Take 650 mg by mouth every 8 (eight) hours as needed for pain.   Yes [provider]  allopurinol (ZYLOPRIM) 100 MG tablet Take 100 mg by mouth daily.   Yes [provider]  amLODipine (NORVASC) 5 MG tablet Take 1 tablet (5 mg total) by mouth daily. 09/14/21 02/03/22 Yes Hilty, Lisette Abu, MD  aspirin EC 81 MG tablet Take 81 mg by mouth daily.   Yes [provider]  carvedilol (COREG) 25 MG tablet Take 0.5 tablets (12.5 mg total) by mouth 2 (two) times daily. 09/14/21  Yes Hilty, Lisette Abu, MD  diclofenac Sodium (VOLTAREN) 1 % GEL Apply 2 g topically 2 (two) times daily as needed (pain).   Yes [provider]  docusate sodium (COLACE) 100 MG capsule Take 100 mg by mouth in the morning and at bedtime.   Yes [provider]  FEROSUL 325 (65 Fe) MG tablet Take 325 mg by mouth 2 (two) times a week. 04/21/21  Yes [provider]  gabapentin (NEURONTIN) 300 MG capsule Take 1 capsule (300 mg total) by mouth at bedtime. 05/22/19  Yes Delano Metz, MD  isosorbide mononitrate (IMDUR) 30 MG 24 hr tablet Take 1 tablet by mouth once daily Patient taking differently: Take 30 mg by mouth in the morning. 10/07/21  Yes Hilty, Lisette Abu, MD  LANTUS SOLOSTAR 100 UNIT/ML Solostar Pen Inject 28 Units into the skin in the morning. 05/06/21  Yes [provider]  meclizine (ANTIVERT) 25 MG tablet Take 25 mg by mouth daily as needed for dizziness.   Yes [provider]  ondansetron (ZOFRAN-ODT) 4 MG disintegrating tablet Take 4 mg by mouth every 8 (eight) hours as needed for nausea or vomiting (dissolve orally). 12/05/20  Yes [provider]  pantoprazole (PROTONIX) 40 MG tablet Take 40 mg by mouth daily before breakfast. 06/24/20  Yes [provider]  Polyethyl Glycol-Propyl Glycol (SYSTANE OP) Place 1 drop into both eyes 2 (two)  times daily as needed (dry eyes).   Yes [provider]  rosuvastatin (CRESTOR) 10 MG tablet Take 10 mg by mouth at bedtime. 01/28/21  Yes [provider]  SYNTHROID 100 MCG tablet Take 100 mcg by mouth at bedtime. 03/26/21  Yes [provider]  torsemide (DEMADEX) 20 MG tablet Take 20-40 mg by mouth in the morning. 10/24/20  Yes [provider]  nitroGLYCERIN (NITROSTAT) 0.4 MG SL tablet Place 1 tablet (0.4 mg total) under the tongue every 5 (five) minutes as needed for chest pain (max 3 doses). Patient not taking: Reported on 01/03/2022 02/11/20 01/03/22  Chrystie Nose, MD    Physical Exam: Vitals:   01/03/22 1945 01/03/22 2115 01/04/22 0031 01/04/22 0347  BP: (!) 184/50 (!) 184/157 (!) 159/67 (!) 140/52  Pulse: (!) 59 62 61 (!) 58  Resp: (!) 22 (!) 29 (!) 21 (!) 21  Temp:      TempSrc:      SpO2: 95% 99% 95% 97%  Weight:      Height:       Constitutional: NAD, ill-appearing elderly female laying upright in bed with audible wheezing and increased work of breathing. Eyes: lids and conjunctivae normal ENMT: Mucous membranes are moist.  Neck: normal, supple,  Respiratory: Diminished breath sounds throughout with expiratory wheeze heard anteriorly.  Has increased work of breathing and some use of abdominal accessory muscles while on 2 L via nasal cannula  cardiovascular: Regular rate and rhythm, no murmurs / rubs / gallops. No extremity edema.  Abdomen: no tenderness, Bowel sounds positive.  Musculoskeletal: no clubbing / cyanosis. No joint deformity upper and lower extremities. Good ROM, no contractures. Normal muscle tone.  Skin: no rashes, lesions, ulcers. No induration Neurologic: CN 2-12 grossly intact.. Strength 5/5 in all 4.  Psychiatric: Normal mood. Data Reviewed:  See HPI  Assessment and Plan: Acute respiratory failure with hypoxia (HCC) -secondary to community acquired pneumonia. Hypoxic into 80s with exertion requiring 2L.  -continue IV  Rocephin and azithromycin. -Also potentially has undiagnosed COPD from second-hand smoking through husband in the past. Wheezing noted on exam. PRN duoneb q4hrs  Acute renal failure superimposed on stage 4 chronic kidney disease (HCC) - Creatinine elevated 2.43 from a baseline of 1-2.. Give 500cc bolus and follow repeat creatinine in the morning.  Hold torsemide.  S/P CABG (coronary artery bypass graft), 12/04/11 - Has elevated troponin at 27 but suspect more likely demand ischemia from infection and also worsening renal insufficiency. Continue to trend for peak. -Continue aspirin,Imdur, Coreg  S/P placement of cardiac pacemaker Stable.  Hyperlipidemia associated with type 2 diabetes mellitus (HCC) Continue statin   Insulin dependent type 2 diabetes mellitus (HCC) -Home regimen on 28 units in the morning of Lantus -start with 15 units of semglee and sensitive sliding scale insulin      Advance Care Planning:   Code Status: DNR   Consults: none  Family Communication: Discussed with daughter at bedside  Severity of Illness: The appropriate patient status for this patient is OBSERVATION. Observation status is judged to be reasonable and necessary in order to provide the required intensity of service to ensure the patient's safety. The patient's presenting symptoms, physical exam findings, and initial radiographic and laboratory data in the context of their medical condition is felt to place them at decreased risk for further clinical deterioration. Furthermore, it is anticipated that the patient will be medically stable for discharge from the hospital within 2 midnights of admission.   Author: Anselm Jungling, DO 01/04/2022 4:10 AM  For on call  review [email or web address].

## 2022-01-03 NOTE — ED Provider Notes (Signed)
[place] EMERGENCY DEPARTMENT Provider Note   CSN: [id number] Arrival date & time: 01/03/22  1841     History  Chief Complaint  Patient presents with   Shortness of Breath    [name] is a 81 y.o. female.  HPI 81 year old female history of coronary artery disease, status post pacemaker, pretension, anemia, is post CABG, presents today with increased dyspnea, chest pain, and generalized weakness.  Patient is extremely hard of hearing and history is obtained from patient and later from daughter who presents soon thereafter.  Her daughter states that she has been sick during the past week.  She was seen by primary care last Monday and thought to have some sinus infection.  She was started on amoxicillin but is unclear how much of this she has taken.  She has had 2 COVID vaccines has not had a booster.  She complained to her daughter earlier of dyspnea.  She has had generalized weakness.  She has not having any nausea vomiting or diarrhea.  She has had some decreased p.o. intake.  She has had ongoing urine leakage but has not had any known definitive new urinary tract infection symptoms.      Home Medications Prior to Admission medications   Medication Sig Start Date End Date Taking? Authorizing Provider  acetaminophen (TYLENOL) 650 MG CR tablet Take 650 mg by mouth every 8 (eight) hours as needed for pain.    [provider]  allopurinol (ZYLOPRIM) 100 MG tablet Take 100 mg by mouth daily.    [provider]  amLODipine (NORVASC) 5 MG tablet Take 1 tablet (5 mg total) by mouth daily. 09/14/21 10/14/21  [name], MD  aspirin EC 81 MG tablet Take 81 mg by mouth daily.    [provider]  carvedilol (COREG) 25 MG tablet Take 0.5 tablets (12.5 mg total) by mouth 2 (two) times daily. 09/14/21   Hilty, Nadean Corwin, MD  diclofenac Sodium (VOLTAREN) 1 % GEL Apply 2 g topically 2 (two) times daily as needed (pain).    [provider]  docusate sodium (COLACE) 100 MG capsule Take 100 mg by mouth 2 (two) times daily as needed for mild constipation or moderate constipation.    [provider]  FEROSUL 325 (65 Fe) MG tablet Take 325 mg by mouth daily. 04/21/21   [provider]  gabapentin (NEURONTIN) 300 MG capsule Take 1 capsule (300 mg total) by mouth at bedtime. 05/22/19   Roney Jaffe, MD  isosorbide mononitrate (IMDUR) 30 MG 24 hr tablet Take 1 tablet by mouth once daily 10/07/21   Hilty, Nadean Corwin, MD  LANTUS SOLOSTAR 100 UNIT/ML Solostar Pen Inject 26 Units into the skin in the morning. 05/06/21   [provider]  meclizine (ANTIVERT) 25 MG tablet Take 25 mg by mouth daily as needed for dizziness. For dizziness    [provider]  nitroGLYCERIN (NITROSTAT) 0.4 MG SL tablet Place 1 tablet (0.4 mg total) under the tongue every 5 (five) minutes as needed for chest pain (max 3 doses). 02/11/20 05/11/20  Hilty, Nadean Corwin, MD  nitroGLYCERIN (NITROSTAT) 0.4 MG SL tablet Place 0.4 mg under the tongue every 5 (five) minutes as needed for chest pain.    [provider]  ondansetron (ZOFRAN-ODT) 4 MG disintegrating tablet Take 4 mg by mouth every 8 (eight) hours as needed for nausea or vomiting. 12/05/20   [provider]  pantoprazole (PROTONIX) 40 MG tablet Take 40 mg  by mouth daily. 06/24/20   [provider]  Polyethyl Glycol-Propyl Glycol (SYSTANE OP) Place 1 drop into both eyes 2 (two) times daily as needed (dry eyes).    [provider]  rosuvastatin (CRESTOR) 10 MG tablet Take 10 mg by mouth at bedtime. 01/28/21   [provider]  SYNTHROID 100 MCG tablet Take 100 mcg by mouth every morning. 03/26/21   [provider]  torsemide (DEMADEX) 20 MG tablet Take 20 mg by mouth every other day. 10/24/20   [provider]      Allergies    Clonidine derivatives, Mango flavor, Sulfa antibiotics, Sulfa antibiotics, Crestor  [rosuvastatin calcium], Epinephrine, Hydralazine, Losartan, and Other    Review of Systems   Review of Systems  Constitutional:  Positive for fatigue.  Neurological:  Positive for weakness.  All other systems reviewed and are negative.  Physical Exam Updated Vital Signs BP (!) 184/50    Pulse (!) 59    Temp 97.9 F (36.6 C) (Oral)    Resp (!) 22    Ht 1.524 m (5')    Wt 55.8 kg    SpO2 95%    BMI 24.02 kg/m  Physical Exam Vitals and nursing note reviewed.  Constitutional:      General: She is in acute distress.     Appearance: She is well-developed. She is ill-appearing.  HENT:     Head: Normocephalic.     Mouth/Throat:     Pharynx: Oropharynx is clear.  Eyes:     Extraocular Movements: Extraocular movements intact.     Pupils: Pupils are equal, round, and reactive to light.  Cardiovascular:     Rate and Rhythm: Normal rate and regular rhythm.  Pulmonary:     Effort: Tachypnea present.  Musculoskeletal:     Cervical back: Normal range of motion and neck supple.  Neurological:     Mental Status: She is alert.    ED Results / Procedures / Treatments   Labs (all labs ordered are listed, but only abnormal results are displayed) Labs Reviewed  CBC - Abnormal; Notable for the following components:      Result Value   WBC 12.9 (*)    RBC 2.83 (*)    Hemoglobin 9.5 (*)    HCT 28.5 (*)    MCV 100.7 (*)    Platelets 146 (*)    All other components within normal limits  COMPREHENSIVE METABOLIC PANEL - Abnormal; Notable for the following components:   Glucose, Bld 131 (*)    BUN 64 (*)    Creatinine, Ser 2.43 (*)    Calcium 8.8 (*)    GFR, Estimated 19 (*)    All other components within normal limits  I-STAT VENOUS BLOOD GAS, ED - Abnormal; Notable for the following components:   pO2, Ven 61 (*)    Calcium, Ion 1.14 (*)    HCT 28.0 (*)    Hemoglobin 9.5 (*)    All other components within normal limits  TROPONIN I (HIGH SENSITIVITY) - Abnormal; Notable for the  following components:   Troponin I (High Sensitivity) 27 (*)    All other components within normal limits  RESP PANEL BY RT-PCR (FLU A&B, COVID) ARPGX2  CULTURE, BLOOD (ROUTINE X 2)  CULTURE, BLOOD (ROUTINE X 2)  URINALYSIS, ROUTINE W REFLEX MICROSCOPIC  LACTIC ACID, PLASMA  LACTIC ACID, PLASMA  BLOOD GAS, VENOUS  TROPONIN I (HIGH SENSITIVITY)    EKG EKG Interpretation  Date/Time:  Sunday January 03 2022  18:50:54 EDT Ventricular Rate:  60 PR Interval:  256 QRS Duration: 109 QT Interval:  392 QTC Calculation: 392 R Axis:   22 Text Interpretation: Sinus or ectopic atrial rhythm Non-specific ST-t changes No significant change since last tracing of August 10, 2021 Confirmed by Pattricia Boss [phone]) on 01/03/2022 7:17:36 PM  Radiology DG Chest Port 1 View  Result Date: 01/03/2022 CLINICAL DATA:  Shortness of breath EXAM: PORTABLE CHEST 1 VIEW COMPARISON:  08/10/2021 FINDINGS: Cardiomegaly. Pacer wires in the right atrium and right ventricle, unchanged. Prior CABG. Patchy airspace disease within the right lung concerning for pneumonia. Left lung clear. No effusions or acute bony abnormality. IMPRESSION: Patchy opacities in the right lower lobe and right upper lobe concerning for pneumonia Cardiomegaly. Electronically Signed   By: Rolm Baptise M.D.   On: 01/03/2022 19:34    Procedures Procedures    Medications Ordered in ED Medications  cefTRIAXone (ROCEPHIN) 1 g in sodium chloride 0.9 % 100 mL IVPB (has no administration in time range)  azithromycin (ZITHROMAX) 500 mg in sodium chloride 0.9 % 250 mL IVPB (has no administration in time range)  nitroGLYCERIN (NITROSTAT) SL tablet 0.4 mg (has no administration in time range)  aspirin tablet 325 mg (has no administration in time range)    ED Course/ Medical Decision Making/ A&P Clinical Course as of 01/03/22 2104  Nancy Fetter Jan 03, 2022  1950 X-Karissa Meenan reviewed and interpreted with patchy opacities consistent with pneumonia noted Radiologist  interpretation reviewed [DR]  2012 C-Met reviewed and interpreted with elevated BUN and creatinine with baseline creatinine at 2.0 and now up to 2.43 Electrolytes are within normal limits [DR]  2012 CBC reviewed and interpreted with elevated white blood cell count with mild leukocytosis and stable anemia Thrombocytopenia with platelets of 146,000, stable from prior [DR]  2013 Troponin I (High Sensitivity)(!) Patient complained of some chest pain earlier.  EKG without acute ST elevation. Troponin is mildly elevated at 27 and will need to be trended [DR]  2013 I-Stat venous blood gas, ED(!) VBG obtained and reviewed with normal pH at 7.36 and PO2 61 [DR]  2031 Creatinine(!): 2.43 [DR]    Clinical Course User Index [DR] Pattricia Boss, MD                           Medical Decision Making 81 year old female history of coronary artery disease, cough, dyspnea, generalized weakness.  Here on work-up she has infiltrates noted on chest x-Zayne Marovich.  He is given IV fluids, Rocephin, Zithromax.  Her creatinine is slightly increased.  She is oxygenating on room air but will be given some nasal cannula for comfort decreased work of breathing and cardiac strain.   Discussed chest pain with daughter at bedside.  Patient states it was heavy and woke her up this morning at 8-9 out of 10.  It is currently 3-4 out of 10 she is still having some chest pressure.  She also had some chest pain today.  It is difficult to assess whether this is related to her pneumonia or coronary artery disease.  According to her daughter she frequently has chest pain.  She does not have any acute ST changes on her EKG and first troponin is slightly elevated at 27.  However this is decreased from prior troponins here in the ED.  She will need to have troponins trended Blood pressure is elevated.  Will give nitroglycerin and aspirin. 1-Dyspnea likely secondary to pneumonia 2 chest pain may  be secondary to #1 the patient with known history of  coronary artery disease.  She did not take her aspirin or long-acting nitrate today.  She will be given aspirin nitro here and will need to have troponins trended 3 elevated creatinine from baseline of 2 up to 2.43 this will also need to be trended  Amount and/or Complexity of Data Reviewed Independent Historian:     Details: Daughter at bedside gives additional history. External Data Reviewed: notes. Labs: ordered. Decision-making details documented in ED Course. Radiology: ordered. Discussion of management or test interpretation with external provider(s): Discussed care with Dr. Flossie Buffy who will see for admission  Risk Decision regarding hospitalization.           Final Clinical Impression(s) / ED Diagnoses Final diagnoses:  Community acquired pneumonia of right lung, unspecified part of lung  Chest pain, unspecified type    Rx / DC Orders ED Discharge Orders     None         Pattricia Boss, MD 01/03/22 2104

## 2022-01-03 NOTE — ED Triage Notes (Signed)
[facility] reports pt coming from home c/o increased sob the past few days. Pt is not on oxygen at home, hx of CHF ?

## 2022-01-04 DIAGNOSIS — R0609 Other forms of dyspnea: Secondary | ICD-10-CM | POA: Diagnosis not present

## 2022-01-04 DIAGNOSIS — I4892 Unspecified atrial flutter: Secondary | ICD-10-CM | POA: Diagnosis present

## 2022-01-04 DIAGNOSIS — I517 Cardiomegaly: Secondary | ICD-10-CM | POA: Diagnosis not present

## 2022-01-04 DIAGNOSIS — R040 Epistaxis: Secondary | ICD-10-CM | POA: Diagnosis present

## 2022-01-04 DIAGNOSIS — E11649 Type 2 diabetes mellitus with hypoglycemia without coma: Secondary | ICD-10-CM | POA: Diagnosis not present

## 2022-01-04 DIAGNOSIS — I152 Hypertension secondary to endocrine disorders: Secondary | ICD-10-CM | POA: Diagnosis present

## 2022-01-04 DIAGNOSIS — Z28311 Partially vaccinated for covid-19: Secondary | ICD-10-CM | POA: Diagnosis not present

## 2022-01-04 DIAGNOSIS — J9601 Acute respiratory failure with hypoxia: Secondary | ICD-10-CM | POA: Diagnosis not present

## 2022-01-04 DIAGNOSIS — I495 Sick sinus syndrome: Secondary | ICD-10-CM | POA: Diagnosis present

## 2022-01-04 DIAGNOSIS — I2510 Atherosclerotic heart disease of native coronary artery without angina pectoris: Secondary | ICD-10-CM | POA: Diagnosis present

## 2022-01-04 DIAGNOSIS — J189 Pneumonia, unspecified organism: Secondary | ICD-10-CM | POA: Diagnosis present

## 2022-01-04 DIAGNOSIS — I130 Hypertensive heart and chronic kidney disease with heart failure and stage 1 through stage 4 chronic kidney disease, or unspecified chronic kidney disease: Secondary | ICD-10-CM | POA: Diagnosis present

## 2022-01-04 DIAGNOSIS — I5033 Acute on chronic diastolic (congestive) heart failure: Secondary | ICD-10-CM | POA: Diagnosis present

## 2022-01-04 DIAGNOSIS — I248 Other forms of acute ischemic heart disease: Secondary | ICD-10-CM | POA: Diagnosis present

## 2022-01-04 DIAGNOSIS — Z20822 Contact with and (suspected) exposure to covid-19: Secondary | ICD-10-CM | POA: Diagnosis present

## 2022-01-04 DIAGNOSIS — I252 Old myocardial infarction: Secondary | ICD-10-CM | POA: Diagnosis not present

## 2022-01-04 DIAGNOSIS — E1169 Type 2 diabetes mellitus with other specified complication: Secondary | ICD-10-CM | POA: Diagnosis present

## 2022-01-04 DIAGNOSIS — R918 Other nonspecific abnormal finding of lung field: Secondary | ICD-10-CM | POA: Diagnosis not present

## 2022-01-04 DIAGNOSIS — N179 Acute kidney failure, unspecified: Secondary | ICD-10-CM | POA: Diagnosis present

## 2022-01-04 DIAGNOSIS — D649 Anemia, unspecified: Secondary | ICD-10-CM | POA: Diagnosis present

## 2022-01-04 DIAGNOSIS — E785 Hyperlipidemia, unspecified: Secondary | ICD-10-CM | POA: Diagnosis present

## 2022-01-04 DIAGNOSIS — Z66 Do not resuscitate: Secondary | ICD-10-CM | POA: Diagnosis present

## 2022-01-04 DIAGNOSIS — R0602 Shortness of breath: Secondary | ICD-10-CM | POA: Diagnosis not present

## 2022-01-04 DIAGNOSIS — E1122 Type 2 diabetes mellitus with diabetic chronic kidney disease: Secondary | ICD-10-CM | POA: Diagnosis present

## 2022-01-04 DIAGNOSIS — I480 Paroxysmal atrial fibrillation: Secondary | ICD-10-CM | POA: Diagnosis present

## 2022-01-04 DIAGNOSIS — N184 Chronic kidney disease, stage 4 (severe): Secondary | ICD-10-CM | POA: Diagnosis present

## 2022-01-04 DIAGNOSIS — Z79899 Other long term (current) drug therapy: Secondary | ICD-10-CM | POA: Diagnosis not present

## 2022-01-04 DIAGNOSIS — E039 Hypothyroidism, unspecified: Secondary | ICD-10-CM | POA: Diagnosis present

## 2022-01-04 LAB — BASIC METABOLIC PANEL
Anion gap: 11 (ref 5–15)
BUN: 62 mg/dL — ABNORMAL HIGH (ref 8–23)
CO2: 24 mmol/L (ref 22–32)
Calcium: 8.6 mg/dL — ABNORMAL LOW (ref 8.9–10.3)
Chloride: 108 mmol/L (ref 98–111)
Creatinine, Ser: 2.35 mg/dL — ABNORMAL HIGH (ref 0.44–1.00)
GFR, Estimated: 20 mL/min — ABNORMAL LOW (ref >60)
Glucose, Bld: 72 mg/dL (ref 70–99)
Potassium: 4.3 mmol/L (ref 3.5–5.1)
Sodium: 143 mmol/L (ref 135–145)

## 2022-01-04 LAB — CBC
HCT: 25.5 % — ABNORMAL LOW (ref 36.0–46.0)
Hemoglobin: 8.5 g/dL — ABNORMAL LOW (ref 12.0–15.0)
MCH: 33.5 pg (ref 26.0–34.0)
MCHC: 33.3 g/dL (ref 30.0–36.0)
MCV: 100.4 fL — ABNORMAL HIGH (ref 80.0–100.0)
Platelets: 125 10*3/uL — ABNORMAL LOW (ref 150–400)
RBC: 2.54 MIL/uL — ABNORMAL LOW (ref 3.87–5.11)
RDW: 13.9 % (ref 11.5–15.5)
WBC: 9.6 10*3/uL (ref 4.0–10.5)
nRBC: 0 % (ref 0.0–0.2)

## 2022-01-04 LAB — CBG MONITORING, ED
Glucose-Capillary: 55 mg/dL — ABNORMAL LOW (ref 70–99)
Glucose-Capillary: 85 mg/dL (ref 70–99)
Glucose-Capillary: 159 mg/dL — ABNORMAL HIGH (ref 70–99)
Glucose-Capillary: 205 mg/dL — ABNORMAL HIGH (ref 70–99)

## 2022-01-04 LAB — GLUCOSE, CAPILLARY
Glucose-Capillary: 167 mg/dL — ABNORMAL HIGH (ref 70–99)
Glucose-Capillary: 224 mg/dL — ABNORMAL HIGH (ref 70–99)

## 2022-01-04 LAB — TROPONIN I (HIGH SENSITIVITY): Troponin I (High Sensitivity): 46 ng/L — ABNORMAL HIGH (ref <18)

## 2022-01-04 MED ORDER — LEVOTHYROXINE SODIUM 100 MCG PO TABS
100.0000 ug | ORAL_TABLET | Freq: Every day | ORAL | Status: DC
  Administered 2022-01-04 – 2022-01-09 (×6): 100 ug via ORAL
  Filled 2022-01-04 (×6): qty 1

## 2022-01-04 MED ORDER — ROSUVASTATIN CALCIUM 5 MG PO TABS
10.0000 mg | ORAL_TABLET | Freq: Every day | ORAL | Status: DC
  Administered 2022-01-04 – 2022-01-08 (×5): 10 mg via ORAL
  Filled 2022-01-04 (×5): qty 2

## 2022-01-04 MED ORDER — PANTOPRAZOLE SODIUM 40 MG PO TBEC
40.0000 mg | DELAYED_RELEASE_TABLET | Freq: Every day | ORAL | Status: DC
  Administered 2022-01-04 – 2022-01-09 (×6): 40 mg via ORAL
  Filled 2022-01-04 (×6): qty 1

## 2022-01-04 MED ORDER — INSULIN ASPART 100 UNIT/ML IJ SOLN
0.0000 [IU] | Freq: Three times a day (TID) | INTRAMUSCULAR | Status: DC
  Administered 2022-01-04: 3 [IU] via SUBCUTANEOUS
  Administered 2022-01-04 – 2022-01-05 (×2): 2 [IU] via SUBCUTANEOUS
  Administered 2022-01-05 (×2): 1 [IU] via SUBCUTANEOUS
  Administered 2022-01-06: 3 [IU] via SUBCUTANEOUS
  Administered 2022-01-06: 2 [IU] via SUBCUTANEOUS
  Administered 2022-01-06: 1 [IU] via SUBCUTANEOUS
  Administered 2022-01-07: 3 [IU] via SUBCUTANEOUS
  Administered 2022-01-07 (×2): 2 [IU] via SUBCUTANEOUS
  Administered 2022-01-08: 5 [IU] via SUBCUTANEOUS
  Administered 2022-01-08: 2 [IU] via SUBCUTANEOUS
  Administered 2022-01-08: 3 [IU] via SUBCUTANEOUS
  Administered 2022-01-09: 1 [IU] via SUBCUTANEOUS
  Administered 2022-01-09: 3 [IU] via SUBCUTANEOUS

## 2022-01-04 MED ORDER — GABAPENTIN 300 MG PO CAPS
300.0000 mg | ORAL_CAPSULE | Freq: Every day | ORAL | Status: DC
  Administered 2022-01-04 – 2022-01-08 (×5): 300 mg via ORAL
  Filled 2022-01-04 (×5): qty 1

## 2022-01-04 MED ORDER — AMLODIPINE BESYLATE 5 MG PO TABS
5.0000 mg | ORAL_TABLET | Freq: Every day | ORAL | Status: DC
  Administered 2022-01-04 – 2022-01-09 (×6): 5 mg via ORAL
  Filled 2022-01-04 (×6): qty 1

## 2022-01-04 MED ORDER — ACETAMINOPHEN 325 MG PO TABS
650.0000 mg | ORAL_TABLET | Freq: Four times a day (QID) | ORAL | Status: DC | PRN
  Administered 2022-01-04: 650 mg via ORAL
  Filled 2022-01-04 (×2): qty 2

## 2022-01-04 MED ORDER — DOCUSATE SODIUM 100 MG PO CAPS
100.0000 mg | ORAL_CAPSULE | Freq: Two times a day (BID) | ORAL | Status: DC | PRN
  Administered 2022-01-05: 100 mg via ORAL
  Filled 2022-01-04: qty 1

## 2022-01-04 MED ORDER — SODIUM CHLORIDE 0.9 % IV SOLN
1.0000 g | INTRAVENOUS | Status: AC
  Administered 2022-01-04 – 2022-01-08 (×5): 1 g via INTRAVENOUS
  Filled 2022-01-04 (×6): qty 10

## 2022-01-04 MED ORDER — ISOSORBIDE MONONITRATE ER 30 MG PO TB24
30.0000 mg | ORAL_TABLET | Freq: Every day | ORAL | Status: DC
  Administered 2022-01-04 – 2022-01-09 (×6): 30 mg via ORAL
  Filled 2022-01-04 (×6): qty 1

## 2022-01-04 MED ORDER — CARVEDILOL 12.5 MG PO TABS
12.5000 mg | ORAL_TABLET | Freq: Two times a day (BID) | ORAL | Status: DC
Start: 2022-01-04 — End: 2022-01-09
  Administered 2022-01-04 – 2022-01-09 (×11): 12.5 mg via ORAL
  Filled 2022-01-04: qty 4
  Filled 2022-01-04 (×10): qty 1

## 2022-01-04 MED ORDER — FERROUS SULFATE 325 (65 FE) MG PO TABS
325.0000 mg | ORAL_TABLET | ORAL | Status: DC
  Administered 2022-01-04 – 2022-01-07 (×2): 325 mg via ORAL
  Filled 2022-01-04 (×2): qty 1

## 2022-01-04 MED ORDER — ASPIRIN EC 81 MG PO TBEC
81.0000 mg | DELAYED_RELEASE_TABLET | Freq: Every day | ORAL | Status: DC
  Administered 2022-01-04 – 2022-01-07 (×4): 81 mg via ORAL
  Filled 2022-01-04 (×4): qty 1

## 2022-01-04 MED ORDER — ALLOPURINOL 100 MG PO TABS
100.0000 mg | ORAL_TABLET | Freq: Every day | ORAL | Status: DC
  Administered 2022-01-04 – 2022-01-09 (×6): 100 mg via ORAL
  Filled 2022-01-04 (×6): qty 1

## 2022-01-04 MED ORDER — ENOXAPARIN SODIUM 30 MG/0.3ML IJ SOSY
30.0000 mg | PREFILLED_SYRINGE | Freq: Every day | INTRAMUSCULAR | Status: DC
  Administered 2022-01-05 – 2022-01-07 (×3): 30 mg via SUBCUTANEOUS
  Filled 2022-01-04 (×3): qty 0.3

## 2022-01-04 MED ORDER — SODIUM CHLORIDE 0.9 % IV SOLN
500.0000 mg | INTRAVENOUS | Status: DC
  Administered 2022-01-04 – 2022-01-05 (×2): 500 mg via INTRAVENOUS
  Filled 2022-01-04 (×3): qty 5

## 2022-01-04 MED ORDER — ALBUTEROL SULFATE (2.5 MG/3ML) 0.083% IN NEBU
2.5000 mg | INHALATION_SOLUTION | RESPIRATORY_TRACT | Status: DC | PRN
  Administered 2022-01-04 – 2022-01-08 (×6): 2.5 mg via RESPIRATORY_TRACT
  Filled 2022-01-04 (×7): qty 3

## 2022-01-04 MED ORDER — INSULIN GLARGINE-YFGN 100 UNIT/ML ~~LOC~~ SOLN
15.0000 [IU] | Freq: Every day | SUBCUTANEOUS | Status: DC
  Filled 2022-01-04: qty 0.15

## 2022-01-04 NOTE — Assessment & Plan Note (Signed)
--  Azithromycin '500mg'$  IV q24h --Ceftriaxone 1g IV q24h --Continue supplemental oxygen, maintain SPO2 >/= 92% --Incentive spirometry, flutter valve

## 2022-01-04 NOTE — Assessment & Plan Note (Addendum)
Baseline creatinine 1.9 - 2.4.  Creatinine 2.43 on admission. --Avoid nephrotoxins, renally dose all medications --Holding home torsemide --Monitor urinary output --Repeat BMP in a.m.

## 2022-01-04 NOTE — Assessment & Plan Note (Addendum)
Has elevated troponin at 27>32>46.  Denies chest pain.  Etiology likely secondary to type II demand ischemia in the setting of pneumonia/infection as above.  --Aspirin 81 mg p.o. daily --Carvedilol 12.5 mg p.o. twice daily --Imdur 30 mg p.o. daily

## 2022-01-04 NOTE — Evaluation (Signed)
Clinical/Bedside Swallow Evaluation Patient Details  Name: [name] MRN: [id number] Date of Birth: 1941-07-15  Today's Date: 01/04/2022 Time: SLP Start Time (ACUTE ONLY): 1418 SLP Stop Time (ACUTE ONLY): 1432 SLP Time Calculation (min) (ACUTE ONLY): 14 min  Past Medical History:  Past Medical History:  Diagnosis Date   Anemia    Anxiety    Arthritis    "in my hands; knees, back" (09/27/2018)   CAD (coronary artery disease)    a. 40-59% bilaterally 10/2015.   Chronic diastolic CHF (congestive heart failure) ([place])    a. 05/2016 Echo: EF 60-65%, no rwma, Gr1 DD, Ao sclerosis w/o stenosis, triv MR;  b. 07/2016 TEE: EF 55-60%, no rwma, mild MR.   Chronic headaches    CKD (chronic kidney disease), stage III (HCC)    Coronary artery disease    a. 02/2007 Persantine MV: low risk;  b. 11/2011 CABG x 3 (LIMA->LAD, VG->OM, VG->RCA);  c. 05/2016 MV: EF >65%, no isch/infarct, horiz ST dep in I, II, V5-V6.   Depression    Diverticulosis    Esophageal stricture    GERD (gastroesophageal reflux disease)    Hemorrhoids    Hiatal hernia    Hyperkalemia    a. ARB stopped due to this.   Hyperlipidemia    Hypertension    Hypertensive heart disease    Hypothyroidism    Major depressive disorder with anxious distress 09/05/2019   Mild cognitive impairment    a. seen by neurology.   Mild vascular neurocognitive disorder 09/05/2019   Myocardial infarction ([place]) 12/07/2011   PAF (paroxysmal atrial fibrillation) ([place])    a. post-op CABG.   Pain    RIGHT KNEE PAIN - TORN RIGHT MEDIAL MENISCUS   Paroxysmal atrial flutter ([place])    a. 07/2016 s/p TEE & DCCV;  b. 07/2016 Recurrent PAFlutter req initiation of amio & PPM in setting of tachy-brady;  c. CHA2DS2VASc = 7-->Xarelto 15 mg QD.   Pneumonia    "twice" (09/27/2018)   PONV (postoperative nausea and vomiting)    Presence of permanent cardiac pacemaker 08/12/2016   S/P CABG (coronary artery bypass graft), 12/04/11 12/07/2011   LIMA to LAD, SVG to  OM, SVG to RCA   Sinus bradycardia    a. not on BB due to this.   Skin cancer    "face" (09/27/2018)   Tachy-brady syndrome ([place])    a. 07/2016 Jxnl brady following DCCV, recurrent Aflutter-->amio + SJM 2272 Assurity MRI DC PPM (ser # [id number]).   Type II diabetes mellitus ([place])    Past Surgical History:  Past Surgical History:  Procedure Laterality Date   ABDOMINAL HYSTERECTOMY  1980's   ANKLE FRACTURE SURGERY Right    "put pins both side right ankle"   BACK SURGERY  2006   "cyst growing near my spine"   CARDIAC CATHETERIZATION  12/02/2011   mild LV dysfunction with mod hypocontractility of mid-distal anterolateral wall; CAD w/ostial tapering of L [place] with 50% diffuse ostial narrowing of LAD, 99% eccentric focal prox LAD stenosis followed by 70% prox LAD stenosis after 1st diag, 20% mid LAD narrowing; 80% ostial-to-prox L Cfx stenosis & 40-50% irregularity of RCA (Dr. Corky Downs)   CARDIOVERSION N/A 08/11/2016   Procedure: CARDIOVERSION;  Surgeon: Lelon Perla, MD;  Location: [facility];  Service: Cardiovascular;  Laterality: N/A;   CATARACT EXTRACTION W/ INTRAOCULAR LENS  IMPLANT, BILATERAL Bilateral ~ 2010   [place] GRAFT  12/04/2011  Procedure: CORONARY ARTERY BYPASS GRAFTING (CABG);  Surgeon: Tharon Aquas Adelene Idler, MD;  Location: [facility];  Service: Open Heart Surgery;  Laterality: N/A;  CABG x three,  using left internal mammary artery, and right leg greater saphenous vein harvested endoscopically   CORONARY STENT INTERVENTION N/A 09/27/2018   Procedure: CORONARY STENT INTERVENTION;  Surgeon: Troy Sine, MD;  Location: [facility];  Service: Cardiovascular;  Laterality: N/A;   DILATION AND CURETTAGE OF UTERUS     "a couple times"   EP IMPLANTABLE DEVICE N/A 08/12/2016   Procedure: Pacemaker Implant;  Surgeon: Will Meredith Leeds, MD;  Location: [facility];  Service: Cardiovascular;  Laterality: N/A;    ESOPHAGOGASTRODUODENOSCOPY (EGD) WITH ESOPHAGEAL DILATION     FRACTURE SURGERY     JOINT REPLACEMENT     KNEE ARTHROSCOPY WITH MEDIAL MENISECTOMY Right 07/02/2014   Procedure: RIGHT KNEE ARTHROSCOPY WITH PARTIAL MEDIAL MENISTECTOMY, ABRASION CONDROPLASTYU OF PATELLA,ABRASION CONDROPLASTY OF MEDIAL FEMEROL CONDYL, MICROFRACTURE OF MEDIAL FEMEROL CONDYL;  Surgeon: Tobi Bastos, MD;  Location: [facility];  Service: Orthopedics;  Laterality: Right;   LEFT HEART CATH AND CORS/GRAFTS ANGIOGRAPHY N/A 09/06/2018   Procedure: LEFT HEART CATH AND CORS/GRAFTS ANGIOGRAPHY;  Surgeon: Troy Sine, MD;  Location: [facility];  Service: Cardiovascular;  Laterality: N/A;   LEFT HEART CATHETERIZATION WITH CORONARY ANGIOGRAM N/A 12/02/2011   Procedure: LEFT HEART CATHETERIZATION WITH CORONARY ANGIOGRAM;  Surgeon: Troy Sine, MD;  Location: [facility] C[facility] LAB;  Service: Cardiovascular;  Laterality: N/A;  Coronary angiogram, possible PCI   TEE WITHOUT CARDIOVERSION N/A 08/11/2016   Procedure: TRANSESOPHAGEAL ECHOCARDIOGRAM (TEE);  Surgeon: Lelon Perla, MD;  Location: [facility] ENDOSCOPY;  Service: Cardiovascular;  Laterality: N/A;   TEE WITHOUT CARDIOVERSION N/A 05/18/2019   Procedure: TRANSESOPHAGEAL ECHOCARDIOGRAM (TEE);  Surgeon: [name], MD;  Location: [facility] ENDOSCOPY;  Service: Cardiovascular;  Laterality: N/A;   [place] Left ~ 2006   TRANSTHORACIC ECHOCARDIOGRAM  02/19/2013   EF 62-69%, grade 1 diastolic dysfunction; mildly thickend/calcified AV leaflets; mildly calcidied MV annulus; mild TR   HPI:  81 y.o. female presented to ED with increasing SOB. Dx ARF, acute respiratory failure with hypoxia. Prior medical hx paroxsymal atrial fibrillation, esophageal stricture, GERD, SSS s/p pacemaker, HI,  CAD s/p CABG x3, insulin-dependent S8NI, chronic diastolic HF, HTN, and HLD who presents with increasing shortness of breath.  Chest x-ray revealing for right lower and upper  lobe pneumonia. Last endoscopy noted in EMR was March 2012.    Assessment / Plan / Recommendation  Clinical Impression  Ms. Drotar participated in clinical swallowing assessment. Daughter at bedside. She reports no difficulty swallowing - no coughing/choking, no early satiety; no globus.  Currently on 4L [place]; SP02 ranging 87-91% during assessment.  Oral mechanism exam WFL; upper dentures. No cranial nerve deficits.  Pt demonstrated no oral phase deficits - thorough mastication, no residue post swallow.  She consumed thin liquid from a straw with no s/s of aspiration. Mixed regular solid/thin liquid consistencies consumed without difficulty.  Good coordination swallow/respiratory cycles. No dysphagia identified. Continue regular solids, thin liquids.  No SLP f/u needed. SLP Visit Diagnosis: Dysphagia, unspecified (R13.10)    Aspiration Risk  No limitations    Diet Recommendation   Regular solids, thin liquids  Medication Administration: Whole meds with liquid    Other  Recommendations Oral Care Recommendations: Oral care BID    Recommendations for follow up therapy are one component of  a multi-disciplinary discharge planning process, led by the attending physician.  Recommendations may be updated based on patient status, additional functional criteria and insurance authorization.  Follow up Recommendations No SLP follow up        Swallow Study   General Date of Onset: 01/03/22 HPI: 81 y.o. female presented to ED with increasing SOB. Dx ARF, acute respiratory failure with hypoxia. Prior medical hx paroxsymal atrial fibrillation, esophageal stricture, GERD, SSS s/p pacemaker, HI,  CAD s/p CABG x3, insulin-dependent S1JD, chronic diastolic HF, HTN, and HLD who presents with increasing shortness of breath.  Chest x-ray revealing for right lower and upper lobe pneumonia. Last endoscopy noted in EMR was March 2012. Type of Study: MBS-Modified Barium Swallow Study Previous Swallow Assessment:  no Diet Prior to this Study: Regular;Thin liquids Temperature Spikes Noted: Yes Respiratory Status: Nasal cannula History of Recent Intubation: No Behavior/Cognition: Alert;Cooperative Oral Cavity Assessment: Within Functional Limits Oral Care Completed by SLP: No Oral Cavity - Dentition: Dentures, top;Adequate natural dentition Vision: Functional for self-feeding Self-Feeding Abilities: Able to feed self Patient Positioning: Upright in bed Baseline Vocal Quality: Normal Volitional Cough: Strong Volitional Swallow: Able to elicit    Oral/Motor/Sensory Function Overall Oral Motor/Sensory Function: Within functional limits   Ice Chips Ice chips: Not tested   Thin Liquid Thin Liquid: Within functional limits    Nectar Thick Nectar Thick Liquid: Not tested   Honey Thick Honey Thick Liquid: Not tested   Puree Puree: Not tested   Solid     Solid: Within functional limits      Juan Quam Laurice 01/04/2022,2:48 PM  Darryn Kydd L. Tivis Ringer, [place] Office number [phone] Pager [phone]

## 2022-01-04 NOTE — ED Notes (Signed)
Breakfast order placed ?

## 2022-01-04 NOTE — Assessment & Plan Note (Addendum)
Home regimen includes Lantus 20 units every morning.   --Hold long-acting insulin for now --SSI for coverage --CBGs qAC/HS

## 2022-01-04 NOTE — Assessment & Plan Note (Addendum)
Carvedilol 12.5 mg PO BID, Not on anticoagulation outpatient

## 2022-01-04 NOTE — Assessment & Plan Note (Signed)
--  Amlodipine 5 mg p.o. daily --Carvedilol 12.5 mg twice daily --Imdur 30 mg p.o. daily --Holding home torsemide

## 2022-01-04 NOTE — Progress Notes (Signed)
?  Transition of Care (TOC) Screening Note ? ? ?Patient Details  ?Name: [name] ?Date of Birth: 1941/03/30 ? ? ?Transition of Care (TOC) CM/SW Contact:    ?Benard Halsted, LCSW ?Phone Number: ?01/04/2022, 5:31 PM ? ? ? ?Transition of Care Department [facility]) has reviewed patient and no TOC needs have been identified at this time. We will continue to monitor patient advancement through interdisciplinary progression rounds. If new patient transition needs arise, please place a TOC consult. ? ? ?

## 2022-01-04 NOTE — ED Notes (Signed)
Attempted to contact inpatient RN, no response.  ?

## 2022-01-04 NOTE — ED Notes (Addendum)
Pt with increased work of breathing, wheezing O2 sat in 80's. Increased 4LNC. MD [place] paged.  ?

## 2022-01-04 NOTE — Assessment & Plan Note (Addendum)
Stable.  Monitor on telemetry

## 2022-01-04 NOTE — Assessment & Plan Note (Addendum)
Patient presenting to ED with progressive shortness of breath, elevated WBC count of 12.9 with chest x-ray findings of patchy opacities right upper/lower lobe.  Patient was notably hypoxic into the 80s with exertion requiring 2 L nasal cannula. --continue antibiotics as below --Albuterol/DuoNebs PRN SOB/wheezing --Continue supplemental oxygen, maintain SPO2 >/= 92%

## 2022-01-04 NOTE — ED Notes (Signed)
RN unable to obtain second set of blood cultures.  ?

## 2022-01-04 NOTE — Assessment & Plan Note (Addendum)
Crestor 10 mg p.o. daily ?

## 2022-01-04 NOTE — Progress Notes (Signed)
PROGRESS NOTE    [name]  XBD:[id number] DOB: [birth year] DOA: 01/03/2022 PCP: Leeroy Cha, MD    Brief Narrative:  [name] is an 81 year old female with past medical history significant for paroxysmal atrial fibrillation, SSS s/p PPM, CAD s/p CABG x3, type 2 diabetes mellitus, chronic diastolic congestive heart failure, essential hypertension, hyperlipidemia who presented to [facility] ED on 3/12 with progressive shortness of breath.  Patient reports unable to sleep at night due to cough, chest tightness has been going on for last few days.  Patient currently lives alone but daughter goes to visit on occasion.  In the ED, temperature 97.9 F, HR 68, RR 24, BP 176/48, SPO2 94% on room air.  WBC 12.9, hemoglobin 9.5, platelet count 146.  Sodium 139, potassium 4.4, chloride 104, CO2 22, glucose 131, BUN 64, creatinine 2.43.  VBG with pH 7.37, P CO2 45.0, PO2 61.  COVID-19 PCR negative.  Influenza A/B PCR negative.  Urinalysis unrevealing.  Chest x-ray with patchy opacities right lower lobe and right upper lobe concerning for pneumonia, cardiomegaly. Patient was empirically started on antibiotics.  Hospital service consulted for further evaluation and management of acute hypoxic respite failure secondary to pneumonia.    Assessment & Plan:   Assessment and Plan: * Acute respiratory failure with hypoxia ([place]) Patient presenting to ED with progressive shortness of breath, elevated WBC count of 12.9 with chest x-ray findings of patchy opacities right upper/lower lobe.  Patient was notably hypoxic into the 80s with exertion requiring 2 L nasal cannula. --continue antibiotics as below --Albuterol/DuoNebs PRN SOB/wheezing --Continue supplemental oxygen, maintain SPO2 >/= 92%  Community acquired pneumonia --Azithromycin '500mg'$  IV q24h --Ceftriaxone 1g IV q24h --Continue supplemental oxygen, maintain SPO2 >/= 92% --Incentive spirometry, flutter valve  Acute renal failure  superimposed on stage 4 chronic kidney disease (HCC) Baseline creatinine 1.9 - 2.4.  Creatinine 2.43 on admission. --Avoid nephrotoxins, renally dose all medications --Holding home torsemide --Monitor urinary output --Repeat BMP in a.m.  S/P CABG (coronary artery bypass graft), 12/04/11 Has elevated troponin at 27>32>46.  Denies chest pain.  Etiology likely secondary to type II demand ischemia in the setting of pneumonia/infection as above.  --Aspirin 81 mg p.o. daily --Carvedilol 12.5 mg p.o. twice daily --Imdur 30 mg p.o. daily  S/P placement of cardiac pacemaker Stable.  Monitor on telemetry  Insulin dependent type 2 diabetes mellitus ([place]) Home regimen includes Lantus 20 units every morning.   --Hold long-acting insulin for now --SSI for coverage --CBGs qAC/HS  Hypertension associated with diabetes ([place]) --Amlodipine 5 mg p.o. daily --Carvedilol 12.5 mg twice daily --Imdur 30 mg p.o. daily --Holding home torsemide  Hyperlipidemia associated with type 2 diabetes mellitus (HCC) --Crestor 10 mg p.o. daily  Paroxysmal atrial flutter (HCC) --Carvedilol 12.5 mg PO BID --Not on anticoagulation outpatient --Monitor on telemetry  Hypothyroidism --Levothyroxine 100 mcg p.o. daily     DVT prophylaxis: enoxaparin (LOVENOX) injection 40 mg Start: 01/04/22 1000    Code Status: DNR Family Communication: No family present at bedside this morning  Disposition Plan:  Level of care: Telemetry Medical Status is: Inpatient Remains inpatient appropriate because: IV antibiotics, remains on oxygen which she is not dependent at baseline, pending PT/OT/SLP evaluation    Consultants:  None  Procedures:  None  Antimicrobials:  Azithromycin 3/12>> Ceftriaxone 3/12>>   Subjective: Patient seen examined bedside, resting comfortably.  Remains in ED holding area.  Continues with shortness of breath, wheezing.  Requesting additional neb treatment.  No family present at  this time.   Continues on 2 L nasal cannula; not oxygen dependent at baseline.  Denies headache, no dizziness, no chest pain, palpitations, no current fever/chills/night sweats, no nausea cefonicid diarrhea, no abdominal pain, no weakness, no fatigue, no paresthesia.  No acute events overnight per nursing staff.  Objective: Vitals:   01/04/22 1008 01/04/22 1200 01/04/22 1228 01/04/22 1352  BP: (!) 162/69 (!) 154/41  (!) 142/46  Pulse: (!) 59 60  64  Resp: (!) 26 (!) 23  (!) 25  Temp:   (!) 97.5 F (36.4 C) 100 F (37.8 C)  TempSrc:   Oral Oral  SpO2: 91% 95%  96%  Weight:      Height:        Intake/Output Summary (Last 24 hours) at 01/04/2022 1545 Last data filed at 01/04/2022 0224 Gross per 24 hour  Intake 500 ml  Output --  Net 500 ml   Filed Weights   01/03/22 1844  Weight: 55.8 kg    Examination:  Physical Exam: GEN: NAD, alert and oriented x 3, elderly in appearance HEENT: NCAT, PERRL, EOMI, sclera clear, MMM PULM: Crackles notable right upper/lower lobe, with mild diffuse wheezing, normal respiratory effort without accessory muscle use, on 2 L nasal cannula.   CV: RRR w/o M/G/R GI: abd soft, NTND, NABS, no R/G/M MSK: no peripheral edema, muscle strength globally intact 5/5 bilateral upper/lower extremities NEURO: CN II-XII intact, no focal deficits, sensation to light touch intact PSYCH: normal mood/affect Integumentary: dry/intact, no rashes or wounds    Data Reviewed: I have personally reviewed following labs and imaging studies  CBC: Recent Labs  Lab 01/03/22 1902 01/03/22 1918 01/03/22 2242 01/04/22 0515  WBC 12.9*  --   --  9.6  HGB 9.5* 9.5* 9.5* 8.5*  HCT 28.5* 28.0* 28.0* 25.5*  MCV 100.7*  --   --  100.4*  PLT 146*  --   --  024*   Basic Metabolic Panel: Recent Labs  Lab 01/03/22 1902 01/03/22 1918 01/03/22 2242 01/04/22 0515  NA 139 140 141 143  K 4.4 4.4 4.3 4.3  CL 104  --   --  108  CO2 22  --   --  24  GLUCOSE 131*  --   --  72  BUN 64*  --    --  62*  CREATININE 2.43*  --   --  2.35*  CALCIUM 8.8*  --   --  8.6*   GFR: Estimated Creatinine Clearance: 14.5 mL/min (A) (by C-G formula based on SCr of 2.35 mg/dL (H)). Liver Function Tests: Recent Labs  Lab 01/03/22 1902  AST 35  ALT 29  ALKPHOS 83  BILITOT 0.9  PROT 6.9  ALBUMIN 3.7   No results for input(s): LIPASE, AMYLASE in the last 168 hours. No results for input(s): AMMONIA in the last 168 hours. Coagulation Profile: No results for input(s): INR, PROTIME in the last 168 hours. Cardiac Enzymes: No results for input(s): CKTOTAL, CKMB, CKMBINDEX, TROPONINI in the last 168 hours. BNP (last 3 results) No results for input(s): PROBNP in the last 8760 hours. HbA1C: No results for input(s): HGBA1C in the last 72 hours. CBG: Recent Labs  Lab 01/04/22 0731 01/04/22 0805 01/04/22 0919 01/04/22 1112  GLUCAP 55* 85 159* 205*   Lipid Profile: No results for input(s): CHOL, HDL, LDLCALC, TRIG, CHOLHDL, LDLDIRECT in the last 72 hours. Thyroid Function Tests: No results for input(s): TSH, T4TOTAL, FREET4, T3FREE, THYROIDAB in the last 72 hours. Anemia Panel: No  results for input(s): VITAMINB12, FOLATE, FERRITIN, TIBC, IRON, RETICCTPCT in the last 72 hours. Sepsis Labs: Recent Labs  Lab 01/03/22 2232  LATICACIDVEN 0.9    Recent Results (from the past 240 hour(s))  Resp Panel by RT-PCR (Flu A&B, Covid) Nasopharyngeal Swab     Status: None   Collection Time: 01/03/22  7:55 PM   Specimen: Nasopharyngeal Swab; Nasopharyngeal(NP) swabs in vial transport medium  Result Value Ref Range Status   SARS Coronavirus 2 by RT PCR NEGATIVE NEGATIVE Final    Comment: (NOTE) SARS-CoV-2 target nucleic acids are NOT DETECTED.  The SARS-CoV-2 RNA is generally detectable in upper respiratory specimens during the acute phase of infection. The lowest concentration of SARS-CoV-2 viral copies this assay can detect is 138 copies/mL. A negative result does not preclude  SARS-Cov-2 infection and should not be used as the sole basis for treatment or other patient management decisions. A negative result may occur with  improper specimen collection/handling, submission of specimen other than nasopharyngeal swab, presence of viral mutation(s) within the areas targeted by this assay, and inadequate number of viral copies(<138 copies/mL). A negative result must be combined with clinical observations, patient history, and epidemiological information. The expected result is Negative.  Fact Sheet for Patients:  EntrepreneurPulse.com.au  Fact Sheet for Healthcare Providers:  IncredibleEmployment.be  This test is no t yet approved or cleared by the [place] FDA and  has been authorized for detection and/or diagnosis of SARS-CoV-2 by FDA under an Emergency Use Authorization (EUA). This EUA will remain  in effect (meaning this test can be used) for the duration of the COVID-19 declaration under Section 564(b)(1) of the Act, 21 U.S.C.section 360bbb-3(b)(1), unless the authorization is terminated  or revoked sooner.       Influenza A by PCR NEGATIVE NEGATIVE Final   Influenza B by PCR NEGATIVE NEGATIVE Final    Comment: (NOTE) The Xpert Xpress SARS-CoV-2/FLU/RSV plus assay is intended as an aid in the diagnosis of influenza from Nasopharyngeal swab specimens and should not be used as a sole basis for treatment. Nasal washings and aspirates are unacceptable for Xpert Xpress SARS-CoV-2/FLU/RSV testing.  Fact Sheet for Patients: EntrepreneurPulse.com.au  Fact Sheet for Healthcare Providers: IncredibleEmployment.be  This test is not yet approved or cleared by the [place] FDA and has been authorized for detection and/or diagnosis of SARS-CoV-2 by FDA under an Emergency Use Authorization (EUA). This EUA will remain in effect (meaning this test can be used) for the duration of  the COVID-19 declaration under Section 564(b)(1) of the Act, 21 U.S.C. section 360bbb-3(b)(1), unless the authorization is terminated or revoked.  Performed at [facility], [place] [address] [place], [place] [address]   Blood culture (routine x 2)     Status: None (Preliminary result)   Collection Time: 01/03/22 10:33 PM   Specimen: BLOOD RIGHT ARM  Result Value Ref Range Status   Specimen Description BLOOD RIGHT ARM  Final   Special Requests   Final    BOTTLES DRAWN AEROBIC AND ANAEROBIC Blood Culture adequate volume   Culture   Final    NO GROWTH < 12 HOURS Performed at [facility], [place] [address], [place], [place] [address]    Report Status PENDING  Incomplete         Radiology Studies: DG Chest Port 1 View  Result Date: 01/03/2022 CLINICAL DATA:  Shortness of breath EXAM: PORTABLE CHEST 1 VIEW COMPARISON:  08/10/2021 FINDINGS: Cardiomegaly. Pacer wires in the right atrium and  right ventricle, unchanged. Prior CABG. Patchy airspace disease within the right lung concerning for pneumonia. Left lung clear. No effusions or acute bony abnormality. IMPRESSION: Patchy opacities in the right lower lobe and right upper lobe concerning for pneumonia Cardiomegaly. Electronically Signed   By: Rolm Baptise M.D.   On: 01/03/2022 19:34        Scheduled Meds:  allopurinol  100 mg Oral Daily   amLODipine  5 mg Oral Daily   aspirin EC  81 mg Oral Daily   carvedilol  12.5 mg Oral BID WC   enoxaparin (LOVENOX) injection  40 mg Subcutaneous Daily   ferrous sulfate  325 mg Oral Once per day on Mon Thu   gabapentin  300 mg Oral QHS   insulin aspart  0-9 Units Subcutaneous TID WC   isosorbide mononitrate  30 mg Oral Daily   levothyroxine  100 mcg Oral Q0600   pantoprazole  40 mg Oral QAC breakfast   rosuvastatin  10 mg Oral QHS   Continuous Infusions:  azithromycin     cefTRIAXone (ROCEPHIN)  IV       LOS: 0 days    Time spent: 49 minutes spent on chart review,  discussion with nursing staff, consultants, updating family and interview/physical exam; more than 50% of that time was spent in counseling and/or coordination of care.    Kelijah Towry J [place] DO [facility] Available via Epic secure chat 7am-7pm After these hours, please refer to coverage provider listed on [email or web address] 01/04/2022, 3:45 PM

## 2022-01-04 NOTE — ED Notes (Signed)
ED TO INPATIENT HANDOFF REPORT  ED Nurse Name and Phone #: Caryl Pina RN [phone]  S Name/Age/Gender [name] 81 y.o. female Room/Bed: 043C/043C  Code Status   Code Status: DNR  Home/SNF/Other Home Patient oriented to: self, place, time, and situation Is this baseline? Yes   Triage Complete: Triage complete  Chief Complaint Community acquired pneumonia [J18.9] Acute respiratory failure with hypoxia ([name]) [J96.01]  Triage Note [facility] reports pt coming from home c/o increased sob the past few days. Pt is not on oxygen at home, hx of CHF   Allergies Allergies  Allergen Reactions   Clonidine Derivatives Other (See Comments)    Bradycardia and fatigue    Clonidine Hcl Other (See Comments)    Bradycardia   Crestor [Rosuvastatin] Other (See Comments)    Made the patient feel tired/weak   Losartan Potassium Other (See Comments)    Hyperkalemia   Sulfa Antibiotics Other (See Comments)    Childhood reaction not recalled   Epinephrine Other (See Comments)    Abnormal feeling. Dental exam/injection of local w/ epi.   Hydralazine Hcl Anxiety and Other (See Comments)    Nervousness, anxiousness, GI upset    Level of Care/Admitting Diagnosis ED Disposition     ED Disposition  Admit   Condition  --   Comment  Hospital Area: [facility] [[id number]]  Level of Care: Telemetry Medical [104]  May admit patient to [name] or [name] if equivalent level of care is available:: No  Covid Evaluation: Confirmed COVID Negative  Diagnosis: Acute respiratory failure with hypoxia [name]) [[id number]]  Admitting Physician: [place] ERIC J [[id number]]  Attending Physician: [place] ERIC J [[id number]]  Estimated length of stay: past midnight tomorrow  Certification:: I certify this patient will need inpatient services for at least 2 midnights          B Medical/Surgery History Past Medical History:  Diagnosis Date   Anemia    Anxiety    Arthritis    "in my  hands; knees, back" (09/27/2018)   CAD (coronary artery disease)    a. 40-59% bilaterally 10/2015.   Chronic diastolic CHF (congestive heart failure) ([place])    a. 05/2016 Echo: EF 60-65%, no rwma, Gr1 DD, Ao sclerosis w/o stenosis, triv MR;  b. 07/2016 TEE: EF 55-60%, no rwma, mild MR.   Chronic headaches    CKD (chronic kidney disease), stage III (HCC)    Coronary artery disease    a. 02/2007 Persantine MV: low risk;  b. 11/2011 CABG x 3 (LIMA->LAD, VG->OM, VG->RCA);  c. 05/2016 MV: EF >65%, no isch/infarct, horiz ST dep in I, II, V5-V6.   Depression    Diverticulosis    Esophageal stricture    GERD (gastroesophageal reflux disease)    Hemorrhoids    Hiatal hernia    Hyperkalemia    a. ARB stopped due to this.   Hyperlipidemia    Hypertension    Hypertensive heart disease    Hypothyroidism    Major depressive disorder with anxious distress 09/05/2019   Mild cognitive impairment    a. seen by neurology.   Mild vascular neurocognitive disorder 09/05/2019   Myocardial infarction ([name]) 12/07/2011   PAF (paroxysmal atrial fibrillation) ([place])    a. post-op CABG.   Pain    RIGHT KNEE PAIN - TORN RIGHT MEDIAL MENISCUS   Paroxysmal atrial flutter ([name])    a. 07/2016 s/p TEE & DCCV;  b. 07/2016 Recurrent PAFlutter req initiation of amio & PPM in setting  of tachy-brady;  c. CHA2DS2VASc = 7-->Xarelto 15 mg QD.   Pneumonia    "twice" (09/27/2018)   PONV (postoperative nausea and vomiting)    Presence of permanent cardiac pacemaker 08/12/2016   S/P CABG (coronary artery bypass graft), 12/04/11 12/07/2011   LIMA to LAD, SVG to OM, SVG to RCA   Sinus bradycardia    a. not on BB due to this.   Skin cancer    "face" (09/27/2018)   Tachy-brady syndrome ([name])    a. 07/2016 Jxnl brady following DCCV, recurrent Aflutter-->amio + SJM 2272 Assurity MRI DC PPM (ser # [id number]).   Type II diabetes mellitus ([name])    Past Surgical History:  Procedure Laterality Date   ABDOMINAL HYSTERECTOMY  1980's   ANKLE  FRACTURE SURGERY Right    "put pins both side right ankle"   BACK SURGERY  2006   "cyst growing near my spine"   CARDIAC CATHETERIZATION  12/02/2011   mild LV dysfunction with mod hypocontractility of mid-distal anterolateral wall; CAD w/ostial tapering of L Main with 50% diffuse ostial narrowing of LAD, 99% eccentric focal prox LAD stenosis followed by 70% prox LAD stenosis after 1st diag, 20% mid LAD narrowing; 80% ostial-to-prox L Cfx stenosis & 40-50% irregularity of RCA (Dr. Corky Downs)   CARDIOVERSION N/A 08/11/2016   Procedure: CARDIOVERSION;  Surgeon: Lelon Perla, MD;  Location: [facility];  Service: Cardiovascular;  Laterality: N/A;   CATARACT EXTRACTION W/ INTRAOCULAR LENS  IMPLANT, BILATERAL Bilateral ~ 2010   [place] GRAFT  12/04/2011   Procedure: CORONARY ARTERY BYPASS GRAFTING (CABG);  Surgeon: Tharon Aquas Adelene Idler, MD;  Location: [facility];  Service: Open Heart Surgery;  Laterality: N/A;  CABG x three,  using left internal mammary artery, and right leg greater saphenous vein harvested endoscopically   CORONARY STENT INTERVENTION N/A 09/27/2018   Procedure: CORONARY STENT INTERVENTION;  Surgeon: Troy Sine, MD;  Location: [facility];  Service: Cardiovascular;  Laterality: N/A;   DILATION AND CURETTAGE OF UTERUS     "a couple times"   EP IMPLANTABLE DEVICE N/A 08/12/2016   Procedure: Pacemaker Implant;  Surgeon: Will Meredith Leeds, MD;  Location: [facility];  Service: Cardiovascular;  Laterality: N/A;   ESOPHAGOGASTRODUODENOSCOPY (EGD) WITH ESOPHAGEAL DILATION     FRACTURE SURGERY     JOINT REPLACEMENT     KNEE ARTHROSCOPY WITH MEDIAL MENISECTOMY Right 07/02/2014   Procedure: RIGHT KNEE ARTHROSCOPY WITH PARTIAL MEDIAL MENISTECTOMY, ABRASION CONDROPLASTYU OF PATELLA,ABRASION CONDROPLASTY OF MEDIAL FEMEROL CONDYL, MICROFRACTURE OF MEDIAL FEMEROL CONDYL;  Surgeon: Tobi Bastos, MD;  Location[facility];  Service: Orthopedics;   Laterality: Right;   LEFT HEART CATH AND CORS/GRAFTS ANGIOGRAPHY N/A 09/06/2018   Procedure: LEFT HEART CATH AND CORS/GRAFTS ANGIOGRAPHY;  Surgeon: Troy Sine, MD;  Location: [facility];  Service: Cardiovascular;  Laterality: N/A;   LEFT HEART CATHETERIZATION WITH CORONARY ANGIOGRAM N/A 12/02/2011   Procedure: LEFT HEART CATHETERIZATION WITH CORONARY ANGIOGRAM;  Surgeon: Troy Sine, MD;  Location: [facility];  Service: Cardiovascular;  Laterality: N/A;  Coronary angiogram, possible PCI   TEE WITHOUT CARDIOVERSION N/A 08/11/2016   Procedure: TRANSESOPHAGEAL ECHOCARDIOGRAM (TEE);  Surgeon: Lelon Perla, MD;  Location: [facility];  Service: Cardiovascular;  Laterality: N/A;   TEE WITHOUT CARDIOVERSION N/A 05/18/2019   Procedure: TRANSESOPHAGEAL ECHOCARDIOGRAM (TEE);  Surgeon: [name], MD;  Location: [facility];  Service: Cardiovascular;  Laterality: N/A;  TONSILLECTOMY  1949   TOTAL KNEE ARTHROPLASTY Left ~ 2006   TRANSTHORACIC ECHOCARDIOGRAM  02/19/2013   EF 03-50%, grade 1 diastolic dysfunction; mildly thickend/calcified AV leaflets; mildly calcidied MV annulus; mild TR     A IV Location/Drains/Wounds Patient Lines/Drains/Airways Status     Active Line/Drains/Airways     Name Placement date Placement time Site Days   Peripheral IV 01/03/22 20 G 1" Left Antecubital 01/03/22  1903  Antecubital  1   Peripheral IV 01/03/22 20 G Right Antecubital 01/03/22  --  Antecubital  1            Intake/Output Last 24 hours  Intake/Output Summary (Last 24 hours) at 01/04/2022 1349 Last data filed at 01/04/2022 0224 Gross per 24 hour  Intake 500 ml  Output --  Net 500 ml    Labs/Imaging Results for orders placed or performed during the hospital encounter of 01/03/22 (from the past 48 hour(s))  CBC     Status: Abnormal   Collection Time: 01/03/22  7:02 PM  Result Value Ref Range   WBC 12.9 (H) 4.0 - 10.5 K/uL   RBC 2.83 (L) 3.87 - 5.11 MIL/uL   Hemoglobin  9.5 (L) 12.0 - 15.0 g/dL   HCT 28.5 (L) 36.0 - 46.0 %   MCV 100.7 (H) 80.0 - 100.0 fL   MCH 33.6 26.0 - 34.0 pg   MCHC 33.3 30.0 - 36.0 g/dL   RDW 13.8 11.5 - 15.5 %   Platelets 146 (L) 150 - 400 K/uL   nRBC 0.0 0.0 - 0.2 %    Comment: Performed at [facility], [address] [place], [place] [address]  Comprehensive metabolic panel     Status: Abnormal   Collection Time: 01/03/22  7:02 PM  Result Value Ref Range   Sodium 139 135 - 145 mmol/L   Potassium 4.4 3.5 - 5.1 mmol/L   Chloride 104 98 - 111 mmol/L   CO2 22 22 - 32 mmol/L   Glucose, Bld 131 (H) 70 - 99 mg/dL    Comment: Glucose reference range applies only to samples taken after fasting for at least 8 hours.   BUN 64 (H) 8 - 23 mg/dL   Creatinine, Ser 2.43 (H) 0.44 - 1.00 mg/dL   Calcium 8.8 (L) 8.9 - 10.3 mg/dL   Total Protein 6.9 6.5 - 8.1 g/dL   Albumin 3.7 3.5 - 5.0 g/dL   AST 35 15 - 41 U/L   ALT 29 0 - 44 U/L   Alkaline Phosphatase 83 38 - 126 U/L   Total Bilirubin 0.9 0.3 - 1.2 mg/dL   GFR, Estimated 19 (L) >60 mL/min    Comment: (NOTE) Calculated using the CKD-EPI Creatinine Equation (2021)    Anion gap 13 5 - 15    Comment: Performed at [facility] [address], [place], [place] [address]  Troponin I (High Sensitivity)     Status: Abnormal   Collection Time: 01/03/22  7:02 PM  Result Value Ref Range   Troponin I (High Sensitivity) 27 (H) <18 ng/L    Comment: (NOTE) Elevated high sensitivity troponin I (hsTnI) values and significant  changes across serial measurements may suggest ACS but many other  chronic and acute conditions are known to elevate hsTnI results.  Refer to the "Links" section for chest pain algorithms and additional  guidance. Performed at [facility], [place] [address] [place], [place] [address]   I-Stat venous blood gas, ED  Status: Abnormal   Collection Time: 01/03/22  7:18 PM  Result Value Ref Range   pH, Ven 7.365 7.25 - 7.43   pCO2, Ven 45.0 44 - 60 mmHg    pO2, Ven 61 (H) 32 - 45 mmHg   Bicarbonate 25.7 20.0 - 28.0 mmol/L   TCO2 27 22 - 32 mmol/L   O2 Saturation 90 %   Acid-Base Excess 0.0 0.0 - 2.0 mmol/L   Sodium 140 135 - 145 mmol/L   Potassium 4.4 3.5 - 5.1 mmol/L   Calcium, Ion 1.14 (L) 1.15 - 1.40 mmol/L   HCT 28.0 (L) 36.0 - 46.0 %   Hemoglobin 9.5 (L) 12.0 - 15.0 g/dL   Sample type VENOUS   Resp Panel by RT-PCR (Flu A&B, Covid) Nasopharyngeal Swab     Status: None   Collection Time: 01/03/22  7:55 PM   Specimen: Nasopharyngeal Swab; Nasopharyngeal(NP) swabs in vial transport medium  Result Value Ref Range   SARS Coronavirus 2 by RT PCR NEGATIVE NEGATIVE    Comment: (NOTE) SARS-CoV-2 target nucleic acids are NOT DETECTED.  The SARS-CoV-2 RNA is generally detectable in upper respiratory specimens during the acute phase of infection. The lowest concentration of SARS-CoV-2 viral copies this assay can detect is 138 copies/mL. A negative result does not preclude SARS-Cov-2 infection and should not be used as the sole basis for treatment or other patient management decisions. A negative result may occur with  improper specimen collection/handling, submission of specimen other than nasopharyngeal swab, presence of viral mutation(s) within the areas targeted by this assay, and inadequate number of viral copies(<138 copies/mL). A negative result must be combined with clinical observations, patient history, and epidemiological information. The expected result is Negative.  Fact Sheet for Patients:  EntrepreneurPulse.com.au  Fact Sheet for Healthcare Providers:  IncredibleEmployment.be  This test is no t yet approved or cleared by the [place] FDA and  has been authorized for detection and/or diagnosis of SARS-CoV-2 by FDA under an Emergency Use Authorization (EUA). This EUA will remain  in effect (meaning this test can be used) for the duration of the COVID-19 declaration under Section  564(b)(1) of the Act, 21 U.S.C.section 360bbb-3(b)(1), unless the authorization is terminated  or revoked sooner.       Influenza A by PCR NEGATIVE NEGATIVE   Influenza B by PCR NEGATIVE NEGATIVE    Comment: (NOTE) The Xpert Xpress SARS-CoV-2/FLU/RSV plus assay is intended as an aid in the diagnosis of influenza from Nasopharyngeal swab specimens and should not be used as a sole basis for treatment. Nasal washings and aspirates are unacceptable for Xpert Xpress SARS-CoV-2/FLU/RSV testing.  Fact Sheet for Patients: EntrepreneurPulse.com.au  Fact Sheet for Healthcare Providers: IncredibleEmployment.be  This test is not yet approved or cleared by the [place] FDA and has been authorized for detection and/or diagnosis of SARS-CoV-2 by FDA under an Emergency Use Authorization (EUA). This EUA will remain in effect (meaning this test can be used) for the duration of the COVID-19 declaration under Section 564(b)(1) of the Act, 21 U.S.C. section 360bbb-3(b)(1), unless the authorization is terminated or revoked.  Performed at [facility], [place] [address], [place], [place] [address]   Lactic acid, plasma     Status: None   Collection Time: 01/03/22 10:32 PM  Result Value Ref Range   Lactic Acid, Venous 0.9 0.5 - 1.9 mmol/L    Comment: Performed at [facility] [address] [place], [place] [address]  Troponin I (High Sensitivity)  Status: Abnormal   Collection Time: 01/03/22 10:32 PM  Result Value Ref Range   Troponin I (High Sensitivity) 32 (H) <18 ng/L    Comment: (NOTE) Elevated high sensitivity troponin I (hsTnI) values and significant  changes across serial measurements may suggest ACS but many other  chronic and acute conditions are known to elevate hsTnI results.  Refer to the "Links" section for chest pain algorithms and additional  guidance. Performed at [facility], [place] [address], [place],  [place] [address]   Blood culture (routine x 2)     Status: None (Preliminary result)   Collection Time: 01/03/22 10:33 PM   Specimen: BLOOD RIGHT ARM  Result Value Ref Range   Specimen Description BLOOD RIGHT ARM    Special Requests      BOTTLES DRAWN AEROBIC AND ANAEROBIC Blood Culture adequate volume   Culture      NO GROWTH < 12 HOURS Performed at [facility], [place] [address] [place], [place] [address]    Report Status PENDING   Urinalysis, Routine w reflex microscopic     Status: Abnormal   Collection Time: 01/03/22 10:40 PM  Result Value Ref Range   Color, Urine YELLOW YELLOW   APPearance CLEAR CLEAR   Specific Gravity, Urine 1.010 1.005 - 1.030   pH 5.0 5.0 - 8.0   Glucose, UA NEGATIVE NEGATIVE mg/dL   Hgb urine dipstick NEGATIVE NEGATIVE   Bilirubin Urine NEGATIVE NEGATIVE   Ketones, ur NEGATIVE NEGATIVE mg/dL   Protein, ur 100 (A) NEGATIVE mg/dL   Nitrite NEGATIVE NEGATIVE   Leukocytes,Ua NEGATIVE NEGATIVE   RBC / HPF 0-5 0 - 5 RBC/hpf   WBC, UA 0-5 0 - 5 WBC/hpf   Bacteria, UA NONE SEEN NONE SEEN   Squamous Epithelial / LPF 0-5 0 - 5   Mucus PRESENT     Comment: Performed at [facility], [place] [address], [place], [place] [address]  I-Stat venous blood gas, ED     Status: Abnormal   Collection Time: 01/03/22 10:42 PM  Result Value Ref Range   pH, Ven 7.405 7.25 - 7.43   pCO2, Ven 38.8 (L) 44 - 60 mmHg   pO2, Ven 37 32 - 45 mmHg   Bicarbonate 24.3 20.0 - 28.0 mmol/L   TCO2 25 22 - 32 mmol/L   O2 Saturation 71 %   Acid-Base Excess 0.0 0.0 - 2.0 mmol/L   Sodium 141 135 - 145 mmol/L   Potassium 4.3 3.5 - 5.1 mmol/L   Calcium, Ion 1.15 1.15 - 1.40 mmol/L   HCT 28.0 (L) 36.0 - 46.0 %   Hemoglobin 9.5 (L) 12.0 - 15.0 g/dL   Sample type VENOUS    Comment NOTIFIED PHYSICIAN   CBC     Status: Abnormal   Collection Time: 01/04/22  5:15 AM  Result Value Ref Range   WBC 9.6 4.0 - 10.5 K/uL   RBC 2.54 (L) 3.87 - 5.11 MIL/uL   Hemoglobin 8.5 (L) 12.0 - 15.0 g/dL    HCT 25.5 (L) 36.0 - 46.0 %   MCV 100.4 (H) 80.0 - 100.0 fL   MCH 33.5 26.0 - 34.0 pg   MCHC 33.3 30.0 - 36.0 g/dL   RDW 13.9 11.5 - 15.5 %   Platelets 125 (L) 150 - 400 K/uL   nRBC 0.0 0.0 - 0.2 %    Comment: Performed at [facility], [place] [address], [place], [place] [address]  Basic metabolic panel  Status: Abnormal   Collection Time: 01/04/22  5:15 AM  Result Value Ref Range   Sodium 143 135 - 145 mmol/L   Potassium 4.3 3.5 - 5.1 mmol/L   Chloride 108 98 - 111 mmol/L   CO2 24 22 - 32 mmol/L   Glucose, Bld 72 70 - 99 mg/dL    Comment: Glucose reference range applies only to samples taken after fasting for at least 8 hours.   BUN 62 (H) 8 - 23 mg/dL   Creatinine, Ser 2.35 (H) 0.44 - 1.00 mg/dL   Calcium 8.6 (L) 8.9 - 10.3 mg/dL   GFR, Estimated 20 (L) >60 mL/min    Comment: (NOTE) Calculated using the CKD-EPI Creatinine Equation (2021)    Anion gap 11 5 - 15    Comment: Performed at [facility] [address], [place], [place] [address]  Troponin I (High Sensitivity)     Status: Abnormal   Collection Time: 01/04/22  5:15 AM  Result Value Ref Range   Troponin I (High Sensitivity) 46 (H) <18 ng/L    Comment: (NOTE) Elevated high sensitivity troponin I (hsTnI) values and significant  changes across serial measurements may suggest ACS but many other  chronic and acute conditions are known to elevate hsTnI results.  Refer to the "Links" section for chest pain algorithms and additional  guidance. Performed at [facility], [place] [address], [place], [place] [address]   CBG monitoring, ED     Status: Abnormal   Collection Time: 01/04/22  7:31 AM  Result Value Ref Range   Glucose-Capillary 55 (L) 70 - 99 mg/dL    Comment: Glucose reference range applies only to samples taken after fasting for at least 8 hours.  CBG monitoring, ED     Status: None   Collection Time: 01/04/22  8:05 AM  Result Value Ref Range   Glucose-Capillary 85 70 - 99 mg/dL     Comment: Glucose reference range applies only to samples taken after fasting for at least 8 hours.  CBG monitoring, ED     Status: Abnormal   Collection Time: 01/04/22  9:19 AM  Result Value Ref Range   Glucose-Capillary 159 (H) 70 - 99 mg/dL    Comment: Glucose reference range applies only to samples taken after fasting for at least 8 hours.  CBG monitoring, ED     Status: Abnormal   Collection Time: 01/04/22 11:12 AM  Result Value Ref Range   Glucose-Capillary 205 (H) 70 - 99 mg/dL    Comment: Glucose reference range applies only to samples taken after fasting for at least 8 hours.   DG Chest Port 1 View  Result Date: 01/03/2022 CLINICAL DATA:  Shortness of breath EXAM: PORTABLE CHEST 1 VIEW COMPARISON:  08/10/2021 FINDINGS: Cardiomegaly. Pacer wires in the right atrium and right ventricle, unchanged. Prior CABG. Patchy airspace disease within the right lung concerning for pneumonia. Left lung clear. No effusions or acute bony abnormality. IMPRESSION: Patchy opacities in the right lower lobe and right upper lobe concerning for pneumonia Cardiomegaly. Electronically Signed   By: Rolm Baptise M.D.   On: 01/03/2022 19:34    Pending Labs Unresulted Labs (From admission, onward)     Start     Ordered   01/04/22 0841  Brain natriuretic peptide  Add-on,   AD        01/04/22 0840   01/03/22 1911  Blood gas, venous (at [facility] and [facility], not at [facility])  ONCE - STAT,  STAT        01/03/22 1910   01/03/22 1910  Blood culture (routine x 2)  BLOOD CULTURE X 2,   STAT      01/03/22 1910            Vitals/Pain Today's Vitals   01/04/22 0702 01/04/22 1008 01/04/22 1200 01/04/22 1228  BP: (!) 161/61 (!) 162/69 (!) 154/41   Pulse: (!) 58 (!) 59 60   Resp: (!) 25 (!) 26 (!) 23   Temp:    (!) 97.5 F (36.4 C)  TempSrc:    Oral  SpO2: 98% 91% 95%   Weight:      Height:      PainSc:        Isolation Precautions No active isolations  Medications Medications  nitroGLYCERIN (NITROSTAT) SL tablet  0.4 mg (has no administration in time range)  ipratropium-albuterol (DUONEB) 0.5-2.5 (3) MG/3ML nebulizer solution 3 mL (3 mLs Nebulization Given 01/04/22 0737)  enoxaparin (LOVENOX) injection 40 mg (40 mg Subcutaneous Given 01/04/22 0917)  aspirin EC tablet 81 mg (81 mg Oral Given 01/04/22 0916)  allopurinol (ZYLOPRIM) tablet 100 mg (100 mg Oral Given 01/04/22 0917)  amLODipine (NORVASC) tablet 5 mg (5 mg Oral Given 01/04/22 0917)  carvedilol (COREG) tablet 12.5 mg (12.5 mg Oral Given 01/04/22 0737)  isosorbide mononitrate (IMDUR) 24 hr tablet 30 mg (30 mg Oral Given 01/04/22 0924)  rosuvastatin (CRESTOR) tablet 10 mg (has no administration in time range)  levothyroxine (SYNTHROID) tablet 100 mcg (100 mcg Oral Given 01/04/22 0555)  docusate sodium (COLACE) capsule 100 mg (has no administration in time range)  pantoprazole (PROTONIX) EC tablet 40 mg (40 mg Oral Given 01/04/22 0737)  ferrous sulfate tablet 325 mg (325 mg Oral Given 01/04/22 0737)  gabapentin (NEURONTIN) capsule 300 mg (has no administration in time range)  insulin aspart (novoLOG) injection 0-9 Units (3 Units Subcutaneous Given 01/04/22 1117)  cefTRIAXone (ROCEPHIN) 1 g in sodium chloride 0.9 % 100 mL IVPB (has no administration in time range)  azithromycin (ZITHROMAX) 500 mg in sodium chloride 0.9 % 250 mL IVPB (has no administration in time range)  albuterol (PROVENTIL) (2.5 MG/3ML) 0.083% nebulizer solution 2.5 mg (2.5 mg Nebulization Given 01/04/22 1113)  cefTRIAXone (ROCEPHIN) 1 g in sodium chloride 0.9 % 100 mL IVPB (0 g Intravenous Stopped 01/03/22 2331)  azithromycin (ZITHROMAX) 500 mg in sodium chloride 0.9 % 250 mL IVPB (0 mg Intravenous Stopped 01/04/22 0000)  lactated ringers bolus 500 mL (0 mLs Intravenous Stopped 01/04/22 0224)    Mobility walks with device Moderate fall risk   Focused Assessments Pulmonary Assessment Handoff:  Lung sounds: Bilateral Breath Sounds: Expiratory wheezes L Breath Sounds: Expiratory  wheezes R Breath Sounds: Expiratory wheezes O2 Device: Nasal Cannula O2 Flow Rate (L/min): 4 L/min    R Recommendations: See Admitting Provider Note  Report given to:   Additional Notes:

## 2022-01-04 NOTE — Assessment & Plan Note (Addendum)
Levothyroxine 100 mcg p.o. daily ?

## 2022-01-04 NOTE — ED Notes (Signed)
Pt given orange juice for CBG 55.  ?

## 2022-01-04 NOTE — Hospital Course (Signed)
[name] is an 81 year old female with past medical history significant for paroxysmal atrial fibrillation, SSS s/p PPM, CAD s/p CABG x3, type 2 diabetes mellitus, chronic diastolic congestive heart failure, essential hypertension, hyperlipidemia who presented to [facility] ED on 3/12 with progressive shortness of breath.  Patient reports unable to sleep at night due to cough, chest tightness has been going on for last few days.  Patient currently lives alone but daughter goes to visit on occasion. ? ?In the ED, temperature 97.9 ?F, HR 68, RR 24, BP 176/48, SPO2 94% on room air.  WBC 12.9, hemoglobin 9.5, platelet count 146.  Sodium 139, potassium 4.4, chloride 104, CO2 22, glucose 131, BUN 64, creatinine 2.43.  VBG with pH 7.37, P CO2 45.0, PO2 61.  COVID-19 PCR negative.  Influenza A/B PCR negative.  Urinalysis unrevealing.  Chest x-ray with patchy opacities right lower lobe and right upper lobe concerning for pneumonia, cardiomegaly. Patient was empirically started on antibiotics.  Hospital service consulted for further evaluation and management of acute hypoxic respite failure secondary to pneumonia. ?

## 2022-01-05 LAB — CBC
HCT: 24.2 % — ABNORMAL LOW (ref 36.0–46.0)
Hemoglobin: 8.2 g/dL — ABNORMAL LOW (ref 12.0–15.0)
MCH: 34.2 pg — ABNORMAL HIGH (ref 26.0–34.0)
MCHC: 33.9 g/dL (ref 30.0–36.0)
MCV: 100.8 fL — ABNORMAL HIGH (ref 80.0–100.0)
Platelets: 112 10*3/uL — ABNORMAL LOW (ref 150–400)
RBC: 2.4 MIL/uL — ABNORMAL LOW (ref 3.87–5.11)
RDW: 13.9 % (ref 11.5–15.5)
WBC: 10.3 10*3/uL (ref 4.0–10.5)
nRBC: 0 % (ref 0.0–0.2)

## 2022-01-05 LAB — BASIC METABOLIC PANEL
Anion gap: 12 (ref 5–15)
BUN: 67 mg/dL — ABNORMAL HIGH (ref 8–23)
CO2: 20 mmol/L — ABNORMAL LOW (ref 22–32)
Calcium: 8.1 mg/dL — ABNORMAL LOW (ref 8.9–10.3)
Chloride: 104 mmol/L (ref 98–111)
Creatinine, Ser: 2.58 mg/dL — ABNORMAL HIGH (ref 0.44–1.00)
GFR, Estimated: 18 mL/min — ABNORMAL LOW (ref >60)
Glucose, Bld: 196 mg/dL — ABNORMAL HIGH (ref 70–99)
Potassium: 4.6 mmol/L (ref 3.5–5.1)
Sodium: 136 mmol/L (ref 135–145)

## 2022-01-05 LAB — GLUCOSE, CAPILLARY
Glucose-Capillary: 141 mg/dL — ABNORMAL HIGH (ref 70–99)
Glucose-Capillary: 165 mg/dL — ABNORMAL HIGH (ref 70–99)
Glucose-Capillary: 146 mg/dL — ABNORMAL HIGH (ref 70–99)
Glucose-Capillary: 214 mg/dL — ABNORMAL HIGH (ref 70–99)

## 2022-01-05 LAB — MAGNESIUM: Magnesium: 2.2 mg/dL (ref 1.7–2.4)

## 2022-01-05 LAB — BRAIN NATRIURETIC PEPTIDE: B Natriuretic Peptide: 1484.9 pg/mL — ABNORMAL HIGH (ref 0.0–100.0)

## 2022-01-05 MED ORDER — FUROSEMIDE 10 MG/ML IJ SOLN
40.0000 mg | Freq: Two times a day (BID) | INTRAMUSCULAR | Status: DC
  Administered 2022-01-05 (×2): 40 mg via INTRAVENOUS
  Filled 2022-01-05 (×2): qty 4

## 2022-01-05 NOTE — Assessment & Plan Note (Addendum)
Vascular congestion on chest x-ray with elevated BNP.  TTE 01/06/2022 with LVEF 55-60%, no regional wall motion abnormalities, grade 2 diastolic dysfunction, mild MR, IVC normal.  Patient was treated with IV furosemide and now transition back to home torsemide 20 mg p.o. daily.  Recommend repeat BMP 1 week to ensure renal function remained stable and recommend monitoring of daily weights.  Outpatient follow with cardiology 2-3 weeks. ?

## 2022-01-05 NOTE — Evaluation (Signed)
Occupational Therapy Evaluation ?Patient Details ?Name: [name] ?MRN: [id number] ?DOB: 04/13/1941 ?Today's Date: 01/05/2022 ? ? ?History of Present Illness [name] is an 81 year old female with past medical history significant for paroxysmal atrial fibrillation, SSS s/p PPM, CAD s/p CABG x3, type 2 diabetes mellitus, chronic diastolic congestive heart failure, essential hypertension, hyperlipidemia who presented to [facility] ED on 3/12 with progressive shortness of breath.  COVID-19 PCR negative.  Influenza A/B PCR negative.  Urinalysis unrevealing.  Chest x-ray with patchy opacities right lower lobe and right upper lobe concerning for pneumonia, cardiomegaly. Patient was empirically started on antibiotics  ? ?Clinical Impression ?  ?Pt currently at min assist level for transfers and selfcare tasks at this time.  She was alone most of the time prior to discharge but has her grandson living with her currently.  He has to work and per daughter 24 hr is not available.  Feel she will benefit from acute OT at this time to progress ADL function to supervision level.  Recommend short term SNF rehab post acute to continue progression.  O2 sats at 90-93 % at rest on 5Ls nasal cannula decreasing to 87-89% with activity.  Will continue to follow.   ?   ? ?Recommendations for follow up therapy are one component of a multi-disciplinary discharge planning process, led by the attending physician.  Recommendations may be updated based on patient status, additional functional criteria and insurance authorization.  ? ?Follow Up Recommendations ? Skilled nursing-short term rehab (<3 hours/day)  ?  ?Assistance Recommended at Discharge Frequent or constant Supervision/Assistance  ?Patient can return home with the following A little help with walking and/or transfers;A little help with bathing/dressing/bathroom;Assistance with cooking/housework;Help with stairs or ramp for entrance;Assist for transportation ? ?  ?Functional Status  Assessment ? Patient has had a recent decline in their functional status and demonstrates the ability to make significant improvements in function in a reasonable and predictable amount of time.  ?Equipment Recommendations ? None recommended by OT  ?  ?   ?Precautions / Restrictions Precautions ?Precautions: Fall ?Restrictions ?Weight Bearing Restrictions: No  ? ?  ? ?Mobility Bed Mobility ?Overal bed mobility: Needs Assistance ?Bed Mobility: Supine to Sit ?  ?  ?Supine to sit: HOB elevated ?  ?  ?General bed mobility comments: min guard with HOB elevated and use of the bed rail on the right side ?  ? ?Transfers ?Overall transfer level: Needs assistance ?Equipment used: 1 person hand held assist ?Transfers: Sit to/from Stand, Bed to chair/wheelchair/BSC ?Sit to Stand: Min assist ?  ?  ?Step pivot transfers: Min assist ?  ?  ?  ?  ? ?  ?Balance Overall balance assessment: Needs assistance ?  ?Sitting balance-Leahy Scale: Fair ?  ?  ?Standing balance support: During functional activity ?Standing balance-Leahy Scale: Poor ?Standing balance comment: Pt needs at least one UE supported during mobility to the recliner ?  ?  ?  ?  ?  ?  ?  ?  ?  ?  ?  ?   ? ?ADL either performed or assessed with clinical judgement  ? ?ADL Overall ADL's : Needs assistance/impaired ?Eating/Feeding: Set up;Sitting ?  ?Grooming: Dance movement psychotherapist;Wash/dry hands;Set up;Sitting ?  ?Upper Body Bathing: Supervision/ safety;Sitting ?Upper Body Bathing Details (indicate cue type and reason): simulated ?Lower Body Bathing: Minimal assistance;Sit to/from stand ?  ?Upper Body Dressing : Minimal assistance;Sitting ?Upper Body Dressing Details (indicate cue type and reason): for hospital bed ?Lower Body Dressing: Moderate assistance;Sit  to/from stand ?Lower Body Dressing Details (indicate cue type and reason): for donning gripper socks ?Toilet Transfer: Minimal assistance;Stand-pivot;BSC/3in1 ?Toilet Transfer Details (indicate cue type and reason):  simulated ?Toileting- Clothing Manipulation and Hygiene: Minimal assistance;Sit to/from stand ?  ?  ?  ?Functional mobility during ADLs: Minimal assistance (hand held assist) ?General ADL Comments: Pt currently on 5Ls nasal cannula with sats at 90-93% at rest with HR in the mid 60s.  She exhibits dyspnea at 3/4 with standing during LB selfcare and with transfers with sats decreasing into the upper 80's with HR around 75-80 as well.  Decreased ability to reach her LEs crossing one over the opposite knee, unable to reach down to her feet safely secondary to the height of the bed.  ? ? ? ?Vision Baseline Vision/History: 1 Wears glasses (for near vision mostly) ?Ability to See in Adequate Light: 0 Adequate ?Patient Visual Report: No change from baseline ?Vision Assessment?: No apparent visual deficits  ?   ?Perception Perception ?Perception: Within Functional Limits ?  ?Praxis Praxis ?Praxis: Intact ?  ? ?Pertinent Vitals/Pain Pain Assessment ?Pain Assessment: Faces ?Pain Score: 0-No pain  ? ? ? ?Hand Dominance Right ?  ?Extremity/Trunk Assessment Upper Extremity Assessment ?Upper Extremity Assessment: LUE deficits/detail;Generalized weakness ?LUE Deficits / Details: Pt with history of left shoulder fracture.  AROM 0-90 degrees overall with strength at 3+/5 throughout. ?LUE Sensation: WNL ?LUE Coordination: decreased fine motor (history of arthritis) ?  ?  ?  ?Cervical / Trunk Assessment ?Cervical / Trunk Assessment: Normal ?  ?Communication Communication ?Communication: No difficulties ?  ?Cognition Arousal/Alertness: Awake/alert ?Behavior During Therapy: [facility] for tasks assessed/performed ?Overall Cognitive Status: Within Functional Limits for tasks assessed ?  ?  ?  ?  ?  ?  ?  ?  ?  ?  ?  ?  ?  ?  ?  ?  ?  ?  ?  ?   ?   ?   ? ? ?Home Living Family/patient expects to be discharged to:: Private residence ?Living Arrangements:  (Grandson stays with her currently but he works during the day.) ?Available Help at Discharge:   (Does not have 24 hr supervision) ?Type of Home: House ?Home Access: Stairs to enter ?Entrance Stairs-Number of Steps: 2 ?Entrance Stairs-Rails: None ?Home Layout: One level ?  ?  ?  ?  ?Bathroom Toilet: Standard ?Bathroom Accessibility: Yes ?  ?Home Equipment: Shower seat;BSC/3in1;Other (comment);Cane - single point;Cane - quad (has an adjustable bed without rails) ?  ?  ?  ? ?  ?Prior Functioning/Environment Prior Level of Function : Independent/Modified Independent ?  ?  ?  ?  ?  ?  ?  ?ADLs Comments: She had hired assist for cleaning the past few weeks. ?  ? ?  ?  ?OT Problem List: Decreased strength;Decreased knowledge of use of DME or AE;Decreased activity tolerance;Cardiopulmonary status limiting activity;Impaired balance (sitting and/or standing) ?  ?   ?OT Treatment/Interventions: Self-care/ADL training;Therapeutic exercise;Patient/family education;Balance training;Energy conservation;Therapeutic activities;DME and/or AE instruction  ?  ?OT Goals(Current goals can be found in the care plan section) Acute Rehab OT Goals ?Patient Stated Goal: Pt wants to get back to walking like she could on Saturday before coming into the hospital. ?OT Goal Formulation: With patient/family ?Time For Goal Achievement: 01/19/22 ?Potential to Achieve Goals: Good  ?OT Frequency: Min 2X/week ?  ? ?Co-evaluation   ?  ?  ?  ?  ? ?  ?AM-PAC OT "6 Clicks" Daily Activity     ?  Outcome Measure Help from another person eating meals?: None ?Help from another person taking care of personal grooming?: A Little ?Help from another person toileting, which includes using toliet, bedpan, or urinal?: A Little ?Help from another person bathing (including washing, rinsing, drying)?: A Little ?Help from another person to put on and taking off regular upper body clothing?: A Little ?Help from another person to put on and taking off regular lower body clothing?: A Little ?6 Click Score: 19 ?  ?End of Session Equipment Utilized During Treatment: Gait  belt;Oxygen ?Nurse Communication: Mobility status ? ?Activity Tolerance: Patient tolerated treatment well ?Patient left: in chair;with call bell/phone within reach;with family/visitor present ? ?OT Visit Diagn

## 2022-01-05 NOTE — Progress Notes (Signed)
?PROGRESS NOTE ? ? ? ?[name]  IEP:[id number] DOB: [birth year] DOA: 01/03/2022 ?PCP: Leeroy Cha, MD  ? ? ?Brief Narrative:  ?[name] is an 81 year old female with past medical history significant for paroxysmal atrial fibrillation, SSS s/p PPM, CAD s/p CABG x3, type 2 diabetes mellitus, chronic diastolic congestive heart failure, essential hypertension, hyperlipidemia who presented to [facility] ED on 3/12 with progressive shortness of breath.  Patient reports unable to sleep at night due to cough, chest tightness has been going on for last few days.  Patient currently lives alone but daughter goes to visit on occasion. ? ?In the ED, temperature 97.9 ?F, HR 68, RR 24, BP 176/48, SPO2 94% on room air.  WBC 12.9, hemoglobin 9.5, platelet count 146.  Sodium 139, potassium 4.4, chloride 104, CO2 22, glucose 131, BUN 64, creatinine 2.43.  VBG with pH 7.37, P CO2 45.0, PO2 61.  COVID-19 PCR negative.  Influenza A/B PCR negative.  Urinalysis unrevealing.  Chest x-ray with patchy opacities right lower lobe and right upper lobe concerning for pneumonia, cardiomegaly. Patient was empirically started on antibiotics.  Hospital service consulted for further evaluation and management of acute hypoxic respite failure secondary to pneumonia.  ? ? ?Assessment & Plan: ?  ?Assessment and Plan: ?* Acute respiratory failure with hypoxia (HCC) ?Patient presenting to ED with progressive shortness of breath, elevated WBC count of 12.9 with chest x-ray findings of patchy opacities right upper/lower lobe.  Patient was notably hypoxic into the 80s with exertion requiring 2 L nasal cannula. ?--continue antibiotics as below ?--Albuterol/DuoNebs PRN SOB/wheezing ?--lasix '40mg'$  IV q12h ?--Strict I's and O's and daily weights ?--Continue supplemental oxygen, maintain SPO2 >/= 92% ? ?Community acquired pneumonia ?--Azithromycin '500mg'$  IV q24h ?--Ceftriaxone 1g IV q24h ?--Continue supplemental oxygen, maintain SPO2 >/=  92% ?--Incentive spirometry, flutter valve ? ?Acute renal failure superimposed on stage 4 chronic kidney disease ([place]) ?Baseline creatinine 1.9 - 2.4.  Creatinine 2.43 on admission. ?--Cr 2.43>2.35>2.58 ?--Avoid nephrotoxins, renally dose all medications ?--Monitor urinary output ?--Repeat BMP in a.m. ? ?S/P CABG (coronary artery bypass graft), 12/04/11 ?Has elevated troponin at 27>32>46.  Denies chest pain.  Etiology likely secondary to type II demand ischemia in the setting of pneumonia/infection as above.  ?--Aspirin 81 mg p.o. daily ?--Carvedilol 12.5 mg p.o. twice daily ?--Imdur 30 mg p.o. daily ? ?S/P placement of cardiac pacemaker ?Stable.  Monitor on telemetry ? ?Insulin dependent type 2 diabetes mellitus ([place]) ?Home regimen includes Lantus 20 units every morning.   ?--Hold long-acting insulin for now ?--SSI for coverage ?--CBGs qAC/HS ? ?Hypertension associated with diabetes ([place]) ?--Amlodipine 5 mg p.o. daily ?--Carvedilol 12.5 mg twice daily ?--Imdur 30 mg p.o. daily ?--Holding home torsemide ? ?Hyperlipidemia associated with type 2 diabetes mellitus ([place]) ?--Crestor 10 mg p.o. daily ? ?Paroxysmal atrial flutter ([place]) ?--Carvedilol 12.5 mg PO BID ?--Not on anticoagulation outpatient ?--Monitor on telemetry ? ?Acute on chronic diastolic CHF (congestive heart failure) ([place]) ?Patient continues with hypoxia, vascular congestion on chest x-ray with elevated BNP.  TTE 08/11/2021 with LVEF 66-06%, grade 1 diastolic dysfunction, trivial MR, no aortic stenosis, IVC normal in size. ?--Furosemide 40 mg IV every 12 hours ?--Strict I's and O's and daily weights ?--Repeat chest x-ray in the a.m. ?--Monitor renal function closely daily on IV diuresis ? ?Hypothyroidism ?--Levothyroxine 100 mcg p.o. daily ? ? ? ? ?DVT prophylaxis: enoxaparin (LOVENOX) injection 30 mg Start: 01/05/22 1000 ? ?  Code Status: DNR ?Family Communication: No family present at bedside this morning ? ?  Disposition Plan:  ?Level of care: Telemetry  Medical ?Status is: Inpatient ?Remains inpatient appropriate because: IV antibiotics, remains on oxygen which she is not dependent at baseline, pending PT/OT/SLP evaluation ?  ? ?Consultants:  ?None ? ?Procedures:  ?None ? ?Antimicrobials:  ?Azithromycin 3/12>> ?Ceftriaxone 3/12>> ? ? ?Subjective: ?Patient seen examined bedside, resting comfortably.  Continues to feel "ill".  Continues with shortness of breath.  Poor appetite.  Remains on supplemental oxygen.  Discussed starting IV furosemide for fluid removal today.  No other questions or concerns at this time.  No family present at bedside.  Denies headache, no dizziness, no chest pain, palpitations, no fever/chills/night sweats, no nausea cefonicid diarrhea, no abdominal pain, no weakness, no fatigue, no paresthesia.  No acute events overnight per nursing staff. ? ?Objective: ?Vitals:  ? 01/05/22 0628 01/05/22 0817 01/05/22 0931 01/05/22 1157  ?BP:  (!) 142/43  (!) 133/46  ?Pulse: 60 64 64 63  ?Resp: (!) '22 18 20 20  '$ ?Temp:  98.5 ?F (36.9 ?C)  98.1 ?F (36.7 ?C)  ?TempSrc:  Oral  Oral  ?SpO2: 93% 90% 91% 94%  ?Weight:      ?Height:      ? ? ?Intake/Output Summary (Last 24 hours) at 01/05/2022 1447 ?Last data filed at 01/05/2022 0507 ?Gross per 24 hour  ?Intake 590 ml  ?Output 200 ml  ?Net 390 ml  ? ?Filed Weights  ? 01/03/22 1844 01/05/22 0351  ?Weight: 55.8 kg 60.8 kg  ? ? ?Examination: ? ?Physical Exam: ?GEN: NAD, alert and oriented x 3, elderly in appearance ?HEENT: NCAT, PERRL, EOMI, sclera clear, MMM ?PULM: Crackles notable right upper/lower lobe, with mild diffuse wheezing, normal respiratory effort without accessory muscle use, on 4 L nasal cannula.   ?CV: RRR w/o M/G/R ?GI: abd soft, NTND, NABS, no R/G/M ?MSK: no peripheral edema, muscle strength globally intact 5/5 bilateral upper/lower extremities ?NEURO: CN II-XII intact, no focal deficits, sensation to light touch intact ?PSYCH: normal mood/affect ?Integumentary: dry/intact, no rashes or  wounds ? ? ? ?Data Reviewed: I have personally reviewed following labs and imaging studies ? ?CBC: ?Recent Labs  ?Lab 01/03/22 ?1902 01/03/22 ?1918 01/03/22 ?2242 01/04/22 ?6808 01/05/22 ?0116  ?WBC 12.9*  --   --  9.6 10.3  ?HGB 9.5* 9.5* 9.5* 8.5* 8.2*  ?HCT 28.5* 28.0* 28.0* 25.5* 24.2*  ?MCV 100.7*  --   --  100.4* 100.8*  ?PLT 146*  --   --  125* 112*  ? ?Basic Metabolic Panel: ?Recent Labs  ?Lab 01/03/22 ?1902 01/03/22 ?1918 01/03/22 ?2242 01/04/22 ?8110 01/05/22 ?0116  ?NA 139 140 141 143 136  ?K 4.4 4.4 4.3 4.3 4.6  ?CL 104  --   --  108 104  ?CO2 22  --   --  24 20*  ?GLUCOSE 131*  --   --  72 196*  ?BUN 64*  --   --  62* 67*  ?CREATININE 2.43*  --   --  2.35* 2.58*  ?CALCIUM 8.8*  --   --  8.6* 8.1*  ?MG  --   --   --   --  2.2  ? ?GFR: ?Estimated Creatinine Clearance: 13.7 mL/min (A) (by C-G formula based on SCr of 2.58 mg/dL (H)). ?Liver Function Tests: ?Recent Labs  ?Lab 01/03/22 ?1902  ?AST 35  ?ALT 29  ?ALKPHOS 83  ?BILITOT 0.9  ?PROT 6.9  ?ALBUMIN 3.7  ? ?No results for input(s): LIPASE, AMYLASE in the last 168 hours. ?No results for input(s): AMMONIA  in the last 168 hours. ?Coagulation Profile: ?No results for input(s): INR, PROTIME in the last 168 hours. ?Cardiac Enzymes: ?No results for input(s): CKTOTAL, CKMB, CKMBINDEX, TROPONINI in the last 168 hours. ?BNP (last 3 results) ?No results for input(s): PROBNP in the last 8760 hours. ?HbA1C: ?No results for input(s): HGBA1C in the last 72 hours. ?CBG: ?Recent Labs  ?Lab 01/04/22 ?1112 01/04/22 ?1626 01/04/22 ?2037 01/05/22 ?6578 01/05/22 ?1155  ?GLUCAP 205* 167* 224* 141* 165*  ? ?Lipid Profile: ?No results for input(s): CHOL, HDL, LDLCALC, TRIG, CHOLHDL, LDLDIRECT in the last 72 hours. ?Thyroid Function Tests: ?No results for input(s): TSH, T4TOTAL, FREET4, T3FREE, THYROIDAB in the last 72 hours. ?Anemia Panel: ?No results for input(s): VITAMINB12, FOLATE, FERRITIN, TIBC, IRON, RETICCTPCT in the last 72 hours. ?Sepsis Labs: ?Recent Labs  ?Lab  01/03/22 ?2232  ?LATICACIDVEN 0.9  ? ? ?Recent Results (from the past 240 hour(s))  ?Resp Panel by RT-PCR (Flu A&B, Covid) Nasopharyngeal Swab     Status: None  ? Collection Time: 01/03/22  7:55 PM  ? Specimen: Nasopharyngeal Swab; Nasopha

## 2022-01-05 NOTE — Evaluation (Signed)
Physical Therapy Evaluation ?Patient Details ?Name: [name] ?MRN: [id number] ?DOB: 1941-04-17 ?Today's Date: 01/05/2022 ? ?History of Present Illness ? 81 y.o. female  who presents 01/03/22 with increasing shortness of breath. +CAP, PMH significant of HOH, CKD, paroxsymal atrial fibrillation, SSS s/p pacemaker, CAD s/p CABG x3, insulin-dependent E3XV, chronic diastolic HF, HTN, and HLD  ?Clinical Impression ? Pt presents to PT with decreased mobility due to decreased strength, decreased balance, and decreased functional activity tolerance. Pt has intermittent support at home so will likely need SNF at DC.  ?   ?   ? ?Recommendations for follow up therapy are one component of a multi-disciplinary discharge planning process, led by the attending physician.  Recommendations may be updated based on patient status, additional functional criteria and insurance authorization. ? ?Follow Up Recommendations Skilled nursing-short term rehab (<3 hours/day) ? ?  ?Assistance Recommended at Discharge Frequent or constant Supervision/Assistance  ?Patient can return home with the following ? A little help with walking and/or transfers;A little help with bathing/dressing/bathroom;Assistance with cooking/housework;Assist for transportation;Help with stairs or ramp for entrance ? ?  ?Equipment Recommendations None recommended by PT  ?Recommendations for Other Services ?    ?  ?Functional Status Assessment Patient has had a recent decline in their functional status and demonstrates the ability to make significant improvements in function in a reasonable and predictable amount of time.  ? ?  ?Precautions / Restrictions Precautions ?Precautions: Fall;Other (comment) ?Precaution Comments: watch SpO2 ?Restrictions ?Weight Bearing Restrictions: No  ? ?  ? ?Mobility ? Bed Mobility ?Overal bed mobility: Needs Assistance ?Bed Mobility: Sit to Supine ?  ?  ?  ?Sit to supine: Min guard ?  ?General bed mobility comments: Assist for safety ?   ? ?Transfers ?Overall transfer level: Needs assistance ?Equipment used: 1 person hand held assist, Rollator (4 wheels) ?Transfers: Sit to/from Stand ?Sit to Stand: Min assist ?  ?  ?  ?  ?  ?General transfer comment: Assist for balance ?  ? ?Ambulation/Gait ?Ambulation/Gait assistance: Min assist ?Gait Distance (Feet): 15 Feet (x 2) ?Assistive device: 1 person hand held assist, Rollator (4 wheels) ?Gait Pattern/deviations: Step-through pattern, Decreased stride length ?Gait velocity: decr ?Gait velocity interpretation: 1.31 - 2.62 ft/sec, indicative of limited community ambulator ?  ?General Gait Details: Unsteady gait requiring min assist for balance. Pt fatigues quickly ? ?Stairs ?  ?  ?  ?  ?  ? ?Wheelchair Mobility ?  ? ?Modified Rankin (Stroke Patients Only) ?  ? ?  ? ?Balance Overall balance assessment: Needs assistance ?Sitting-balance support: No upper extremity supported, Feet supported ?Sitting balance-Leahy Scale: Fair ?  ?  ?Standing balance support: During functional activity, Single extremity supported ?Standing balance-Leahy Scale: Poor ?Standing balance comment: Pt needs at least on UE support and min guard for static standing ?  ?  ?  ?  ?  ?  ?  ?  ?  ?  ?  ?   ? ? ? ?Pertinent Vitals/Pain    ? ? ?Home Living Family/patient expects to be discharged to:: Private residence ?Living Arrangements:  (Grandson stays with her currently but he works during the day.) ?Available Help at Discharge:  (Does not have 24 hr supervision) ?Type of Home: House ?Home Access: Stairs to enter ?Entrance Stairs-Rails: None ?Entrance Stairs-Number of Steps: 2 ?  ?Home Layout: One level ?Home Equipment: Shower seat;BSC/3in1;Other (comment);Cane - single point;[place] (2 wheels) (has an adjustable bed without rails) ?   ?  ?  Prior Function Prior Level of Function : Independent/Modified Independent ?  ?  ?  ?  ?  ?  ?  ?ADLs Comments: She had hired assist for cleaning the past few weeks. ?  ? ? ?Hand  Dominance  ? Dominant Hand: Right ? ?  ?Extremity/Trunk Assessment  ? Upper Extremity Assessment ?Upper Extremity Assessment: Defer to OT evaluation ?  ? ?Lower Extremity Assessment ?Lower Extremity Assessment: Generalized weakness ?  ? ?Cervical / Trunk Assessment ?Cervical / Trunk Assessment: Normal  ?Communication  ? Communication: No difficulties  ?Cognition Arousal/Alertness: Awake/alert ?Behavior During Therapy: [facility] for tasks assessed/performed ?Overall Cognitive Status: Within Functional Limits for tasks assessed ?  ?  ?  ?  ?  ?  ?  ?  ?  ?  ?  ?  ?  ?  ?  ?  ?  ?  ?  ? ?  ?General Comments General comments (skin integrity, edema, etc.): Pt on 5L O2 at rest with SpO2 92%. Amb on 6L with SpO2 90%. ? ?  ?Exercises    ? ?Assessment/Plan  ?  ?PT Assessment Patient needs continued PT services  ?PT Problem List Decreased strength;Decreased activity tolerance;Decreased balance;Decreased mobility;Cardiopulmonary status limiting activity ? ?   ?  ?PT Treatment Interventions DME instruction;Gait training;Functional mobility training;Therapeutic activities;Therapeutic exercise;Balance training;Patient/family education   ? ?PT Goals (Current goals can be found in the Care Plan section)  ?Acute Rehab PT Goals ?PT Goal Formulation: With patient ?Time For Goal Achievement: 01/19/22 ?Potential to Achieve Goals: Good ? ?  ?Frequency Min 3X/week ?  ? ? ?Co-evaluation   ?  ?  ?  ?  ? ? ?  ?AM-PAC PT "6 Clicks" Mobility  ?Outcome Measure Help needed turning from your back to your side while in a flat bed without using bedrails?: A Little ?Help needed moving from lying on your back to sitting on the side of a flat bed without using bedrails?: A Little ?Help needed moving to and from a bed to a chair (including a wheelchair)?: A Little ?Help needed standing up from a chair using your arms (e.g., wheelchair or bedside chair)?: A Little ?Help needed to walk in hospital room?: Total ?Help needed climbing 3-5 steps with a railing? :  Total ?6 Click Score: 14 ? ?  ?End of Session Equipment Utilized During Treatment: Oxygen ?Activity Tolerance: Patient limited by fatigue ?Patient left: in bed;with call bell/phone within reach;with family/visitor present ?  ?PT Visit Diagnosis: Unsteadiness on feet (R26.81);Other abnormalities of gait and mobility (R26.89);Muscle weakness (generalized) (M62.81) ?  ? ?Time: 6073-7106 ?PT Time Calculation (min) (ACUTE ONLY): 20 min ? ? ?Charges:   PT Evaluation ?$PT Eval Moderate Complexity: 1 Mod ?  ?  ?   ? ? ?[facility] PT ?Acute Rehabilitation Services ?Pager [phone] ?Office [phone] ? ? ?[facility] ?01/05/2022, 2:31 PM ? ?

## 2022-01-05 NOTE — Plan of Care (Signed)
?  Problem: Health Behavior/Discharge Planning: ?Goal: Ability to manage health-related needs will improve ?Outcome: Progressing ?  ?Problem: Clinical Measurements: ?Goal: Will remain free from infection ?Outcome: Progressing ?Goal: Diagnostic test results will improve ?Outcome: Progressing ?Goal: Respiratory complications will improve ?Outcome: Progressing ?Goal: Cardiovascular complication will be avoided ?Outcome: Progressing ?  ?Problem: Activity: ?Goal: Risk for activity intolerance will decrease ?Outcome: Progressing ?  ?

## 2022-01-06 ENCOUNTER — Inpatient Hospital Stay (HOSPITAL_COMMUNITY): Payer: Medicare Other

## 2022-01-06 DIAGNOSIS — R0609 Other forms of dyspnea: Secondary | ICD-10-CM

## 2022-01-06 LAB — BASIC METABOLIC PANEL
Anion gap: 13 (ref 5–15)
BUN: 74 mg/dL — ABNORMAL HIGH (ref 8–23)
CO2: 22 mmol/L (ref 22–32)
Calcium: 8.1 mg/dL — ABNORMAL LOW (ref 8.9–10.3)
Chloride: 103 mmol/L (ref 98–111)
Creatinine, Ser: 2.93 mg/dL — ABNORMAL HIGH (ref 0.44–1.00)
GFR, Estimated: 15 mL/min — ABNORMAL LOW (ref >60)
Glucose, Bld: 148 mg/dL — ABNORMAL HIGH (ref 70–99)
Potassium: 4.2 mmol/L (ref 3.5–5.1)
Sodium: 138 mmol/L (ref 135–145)

## 2022-01-06 LAB — BRAIN NATRIURETIC PEPTIDE: B Natriuretic Peptide: 1115.1 pg/mL — ABNORMAL HIGH (ref 0.0–100.0)

## 2022-01-06 LAB — GLUCOSE, CAPILLARY
Glucose-Capillary: 135 mg/dL — ABNORMAL HIGH (ref 70–99)
Glucose-Capillary: 218 mg/dL — ABNORMAL HIGH (ref 70–99)
Glucose-Capillary: 188 mg/dL — ABNORMAL HIGH (ref 70–99)
Glucose-Capillary: 215 mg/dL — ABNORMAL HIGH (ref 70–99)

## 2022-01-06 LAB — CBC
HCT: 22.8 % — ABNORMAL LOW (ref 36.0–46.0)
Hemoglobin: 7.8 g/dL — ABNORMAL LOW (ref 12.0–15.0)
MCH: 34.1 pg — ABNORMAL HIGH (ref 26.0–34.0)
MCHC: 34.2 g/dL (ref 30.0–36.0)
MCV: 99.6 fL (ref 80.0–100.0)
Platelets: 111 10*3/uL — ABNORMAL LOW (ref 150–400)
RBC: 2.29 MIL/uL — ABNORMAL LOW (ref 3.87–5.11)
RDW: 13.8 % (ref 11.5–15.5)
WBC: 6.7 10*3/uL (ref 4.0–10.5)
nRBC: 0 % (ref 0.0–0.2)

## 2022-01-06 LAB — ECHOCARDIOGRAM COMPLETE
Area-P 1/2: 2.52 cm2
Height: 60 in
S' Lateral: 3.2 cm
Weight: 2183.44 oz

## 2022-01-06 MED ORDER — FUROSEMIDE 10 MG/ML IJ SOLN
40.0000 mg | Freq: Every day | INTRAMUSCULAR | Status: DC
  Administered 2022-01-06 – 2022-01-07 (×2): 40 mg via INTRAVENOUS
  Filled 2022-01-06 (×2): qty 4

## 2022-01-06 MED ORDER — AZITHROMYCIN 500 MG PO TABS
500.0000 mg | ORAL_TABLET | Freq: Every day | ORAL | Status: AC
  Administered 2022-01-06 – 2022-01-07 (×2): 500 mg via ORAL
  Filled 2022-01-06 (×2): qty 1

## 2022-01-06 NOTE — Progress Notes (Signed)
?  Echocardiogram ?2D Echocardiogram has been performed. ? ?[name] ?01/06/2022, 3:36 PM ?

## 2022-01-06 NOTE — TOC Progression Note (Signed)
Transition of Care (TOC) - Progression Note  ? ? ?Patient Details  ?Name: [name] ?MRN: [id number] ?Date of Birth: Apr 27, 1941 ? ?Transition of Care (TOC) CM/SW Contact  ?Dillon Mcreynolds D Emree Locicero, Student-Social Work ?Phone Number: ?01/06/2022, 4:00 PM ? ?Clinical Narrative:    ?CSW intern visited patient at bedside to provide list of SNF bed offers. CSW intern left the list on the patients side table as requested by the patients daughter, to look over tomorrow. CSW will follow. ? ? ?Expected Discharge Plan: [place] ?Barriers to Discharge: Continued Medical Work up, Ship broker, SNF Pending bed offer ? ?Expected Discharge Plan and Services ?Expected Discharge Plan: [place] ?In-house Referral: Clinical Social Work ?  ?Post Acute Care Choice: [place] ?Living arrangements for the past 2 months: [place] ?                ?  ?  ?  ?  ?  ?  ?  ?  ?  ?  ? ? ?Social Determinants of Health (SDOH) Interventions ?  ? ?Readmission Risk Interventions ?No flowsheet data found. ? ?

## 2022-01-06 NOTE — Progress Notes (Signed)
?PROGRESS NOTE ? ? ? ?[name]  SKA:[id number] DOB: [birth year] DOA: 01/03/2022 ?PCP: Leeroy Cha, MD  ? ? ?Brief Narrative:  ?[name] is an 81 year old female with past medical history significant for paroxysmal atrial fibrillation, SSS s/p PPM, CAD s/p CABG x3, type 2 diabetes mellitus, chronic diastolic congestive heart failure, essential hypertension, hyperlipidemia who presented to [facility] ED on 3/12 with progressive shortness of breath.  Patient reports unable to sleep at night due to cough, chest tightness has been going on for last few days.  Patient currently lives alone but daughter goes to visit on occasion. ? ?In the ED, temperature 97.9 ?F, HR 68, RR 24, BP 176/48, SPO2 94% on room air.  WBC 12.9, hemoglobin 9.5, platelet count 146.  Sodium 139, potassium 4.4, chloride 104, CO2 22, glucose 131, BUN 64, creatinine 2.43.  VBG with pH 7.37, P CO2 45.0, PO2 61.  COVID-19 PCR negative.  Influenza A/B PCR negative.  Urinalysis unrevealing.  Chest x-ray with patchy opacities right lower lobe and right upper lobe concerning for pneumonia, cardiomegaly. Patient was empirically started on antibiotics.  Hospital service consulted for further evaluation and management of acute hypoxic respite failure secondary to pneumonia.  ? ? ?Assessment & Plan: ?  ?Assessment and Plan: ?* Acute respiratory failure with hypoxia (HCC) ?Patient presenting to ED with progressive shortness of breath, elevated WBC count of 12.9 with chest x-ray findings of patchy opacities right upper/lower lobe.  Patient was notably hypoxic into the 80s with exertion requiring 2 L nasal cannula. ?--continue antibiotics as below ?--Albuterol/DuoNebs PRN SOB/wheezing ?--lasix '40mg'$  IV q24hh ?--Strict I's and O's and daily weights ?--Continue supplemental oxygen, maintain SPO2 >/= 92% ? ?Community acquired pneumonia ?--Azithromycin '500mg'$  IV q24h ?--Ceftriaxone 1g IV q24h ?--Continue supplemental oxygen, maintain SPO2 >/=  92% ?--Incentive spirometry, flutter valve ? ?Acute renal failure superimposed on stage 4 chronic kidney disease ([place]) ?Baseline creatinine 1.9 - 2.4.  Creatinine 2.43 on admission. ?--Cr 2.43>2.35>2.58>2.93 ?--Avoid nephrotoxins, renally dose all medications ?--Monitor urinary output ?--BMP daily ? ?S/P CABG (coronary artery bypass graft), 12/04/11 ?Has elevated troponin at 27>32>46.  Denies chest pain.  Etiology likely secondary to type II demand ischemia in the setting of pneumonia/infection vs dCHF exacerbation.  ?--Aspirin 81 mg p.o. daily ?--Carvedilol 12.5 mg p.o. twice daily ?--Imdur 30 mg p.o. daily ? ?S/P placement of cardiac pacemaker ?Stable.  Monitor on telemetry ? ?Insulin dependent type 2 diabetes mellitus ([place]) ?Home regimen includes Lantus 20 units every morning.   ?--Hold long-acting insulin for now ?--SSI for coverage ?--CBGs qAC/HS ? ?Hypertension associated with diabetes ([place]) ?--Amlodipine 5 mg p.o. daily ?--Carvedilol 12.5 mg twice daily ?--Imdur 30 mg p.o. daily ?--Holding home torsemide ? ?Hyperlipidemia associated with type 2 diabetes mellitus ([place]) ?--Crestor 10 mg p.o. daily ? ?Paroxysmal atrial flutter ([place]) ?--Carvedilol 12.5 mg PO BID ?--Not on anticoagulation outpatient ?--Monitor on telemetry ? ?Acute on chronic diastolic CHF (congestive heart failure) ([place]) ?Patient continues with hypoxia, vascular congestion on chest x-ray with elevated BNP.  TTE 08/11/2021 with LVEF 20-35%, grade 1 diastolic dysfunction, trivial MR, no aortic stenosis, IVC normal in size. ?--repeat TTE ?--Furosemide 40 mg IV q24h ?--Strict I's and O's and daily weights ?--Monitor renal function closely daily on IV diuresis ? ?Hypothyroidism ?--Levothyroxine 100 mcg p.o. daily ? ? ? ? ?DVT prophylaxis: enoxaparin (LOVENOX) injection 30 mg Start: 01/05/22 1000 ? ?  Code Status: DNR ?Family Communication: Updated daughter present at bedside this morning ? ?Disposition Plan:  ?Level of care: Telemetry  Medical ?Status  is: Inpatient ?Remains inpatient appropriate because: IV antibiotics, remains on oxygen which she is not dependent at baseline, plan discharge to SNF when medically ready ?  ? ?Consultants:  ?None ? ?Procedures:  ?None ? ?Antimicrobials:  ?Azithromycin 3/12>> ?Ceftriaxone 3/12>> ? ? ?Subjective: ?Patient seen examined bedside, resting comfortably.  Continues with shortness of breath, slightly improved.  Feels weak, with poor appetite.  Daughter present at bedside states she only eats about 2 meals a day with very poor oral fluid intake.  Oxygen titrated from 5 L to 3 L this morning.  No other questions or concerns at this time.  No family present at bedside.  Denies headache, no dizziness, no chest pain, palpitations, no fever/chills/night sweats, no nausea cefonicid diarrhea, no abdominal pain, no weakness, no fatigue, no paresthesia.  No acute events overnight per nursing staff. ? ?Objective: ?Vitals:  ? 01/06/22 0326 01/06/22 0428 01/06/22 0800 01/06/22 1128  ?BP: (!) 125/46  (!) 155/50 122/74  ?Pulse: 63  63 60  ?Resp: '16  16 16  '$ ?Temp: 98.7 ?F (37.1 ?C)  98.7 ?F (37.1 ?C) 98.4 ?F (36.9 ?C)  ?TempSrc: Oral  Oral Oral  ?SpO2: 97%  92% 93%  ?Weight:  61.9 kg    ?Height:      ? ? ?Intake/Output Summary (Last 24 hours) at 01/06/2022 1300 ?Last data filed at 01/06/2022 0329 ?Gross per 24 hour  ?Intake 350 ml  ?Output 650 ml  ?Net -300 ml  ? ?Filed Weights  ? 01/03/22 1844 01/05/22 0351 01/06/22 0428  ?Weight: 55.8 kg 60.8 kg 61.9 kg  ? ? ?Examination: ? ?Physical Exam: ?GEN: NAD, alert and oriented x 3, elderly in appearance ?HEENT: NCAT, PERRL, EOMI, sclera clear, MMM ?PULM: Crackles notable right upper/lower lobe, with mild diffuse wheezing, normal respiratory effort without accessory muscle use, on 3 L nasal cannula.   ?CV: RRR w/o M/G/R ?GI: abd soft, NTND, NABS, no R/G/M ?MSK: no peripheral edema, muscle strength globally intact 5/5 bilateral upper/lower extremities ?NEURO: CN II-XII intact, no focal deficits,  sensation to light touch intact ?PSYCH: normal mood/affect ?Integumentary: dry/intact, no rashes or wounds ? ? ? ?Data Reviewed: I have personally reviewed following labs and imaging studies ? ?CBC: ?Recent Labs  ?Lab 01/03/22 ?1902 01/03/22 ?1918 01/03/22 ?2242 01/04/22 ?3704 01/05/22 ?0116 01/06/22 ?8889  ?WBC 12.9*  --   --  9.6 10.3 6.7  ?HGB 9.5* 9.5* 9.5* 8.5* 8.2* 7.8*  ?HCT 28.5* 28.0* 28.0* 25.5* 24.2* 22.8*  ?MCV 100.7*  --   --  100.4* 100.8* 99.6  ?PLT 146*  --   --  125* 112* 111*  ? ?Basic Metabolic Panel: ?Recent Labs  ?Lab 01/03/22 ?1902 01/03/22 ?1918 01/03/22 ?2242 01/04/22 ?1694 01/05/22 ?0116 01/06/22 ?5038  ?NA 139 140 141 143 136 138  ?K 4.4 4.4 4.3 4.3 4.6 4.2  ?CL 104  --   --  108 104 103  ?CO2 22  --   --  24 20* 22  ?GLUCOSE 131*  --   --  72 196* 148*  ?BUN 64*  --   --  62* 67* 74*  ?CREATININE 2.43*  --   --  2.35* 2.58* 2.93*  ?CALCIUM 8.8*  --   --  8.6* 8.1* 8.1*  ?MG  --   --   --   --  2.2  --   ? ?GFR: ?Estimated Creatinine Clearance: 12.2 mL/min (A) (by C-G formula based on SCr of 2.93 mg/dL (H)). ?Liver Function Tests: ?Recent  Labs  ?Lab 01/03/22 ?1902  ?AST 35  ?ALT 29  ?ALKPHOS 83  ?BILITOT 0.9  ?PROT 6.9  ?ALBUMIN 3.7  ? ?No results for input(s): LIPASE, AMYLASE in the last 168 hours. ?No results for input(s): AMMONIA in the last 168 hours. ?Coagulation Profile: ?No results for input(s): INR, PROTIME in the last 168 hours. ?Cardiac Enzymes: ?No results for input(s): CKTOTAL, CKMB, CKMBINDEX, TROPONINI in the last 168 hours. ?BNP (last 3 results) ?No results for input(s): PROBNP in the last 8760 hours. ?HbA1C: ?No results for input(s): HGBA1C in the last 72 hours. ?CBG: ?Recent Labs  ?Lab 01/05/22 ?1155 01/05/22 ?1558 01/05/22 ?2240 01/06/22 ?0802 01/06/22 ?1132  ?GLUCAP 165* 146* 214* 135* 218*  ? ?Lipid Profile: ?No results for input(s): CHOL, HDL, LDLCALC, TRIG, CHOLHDL, LDLDIRECT in the last 72 hours. ?Thyroid Function Tests: ?No results for input(s): TSH, T4TOTAL, FREET4,  T3FREE, THYROIDAB in the last 72 hours. ?Anemia Panel: ?No results for input(s): VITAMINB12, FOLATE, FERRITIN, TIBC, IRON, RETICCTPCT in the last 72 hours. ?Sepsis Labs: ?Recent Labs  ?Lab 01/03/22 ?2232  ?LATICACIDVEN

## 2022-01-06 NOTE — [place] FL2 (Signed)
?[place] MEDICAID FL2 LEVEL OF CARE SCREENING TOOL  ?  ? ?IDENTIFICATION  ?Patient Name: ?[name] Birthdate: 10/14/1941 Sex: female Admission Date (Current Location): ?01/03/2022  ?[place] and [place] Number: ? [place] ?  Facility and Address:  ?[facility], [place] [address], [place], [place] [address] ?     Provider Number: ?[id number]  ?Attending Physician Name and Address:  ?[place] Eric J, DO ? Relative Name and Phone Number:  ?[name] (Daughter) [phone] ?   ?Current Level of Care: ?Hospital Recommended Level of Care: ?[place] Prior Approval Number: ?  ? ?Date Approved/Denied: ?  PASRR Number: ?[id number] A ? ?Discharge Plan: ?SNF ?  ? ?Current Diagnoses: ?Patient Active Problem List  ? Diagnosis Date Noted  ? Community acquired pneumonia 01/03/2022  ? Acute renal failure superimposed on stage 4 chronic kidney disease ([place]) 01/03/2022  ? Acute respiratory failure with hypoxia ([place]) 01/03/2022  ? Chest pain 08/10/2021  ? CKD (chronic kidney disease), stage III ([place]) 08/10/2021  ? Acute kidney injury superimposed on chronic kidney disease ([place]) 10/03/2020  ? Nausea & vomiting 10/03/2020  ? Abdominal pain 10/03/2020  ? Mild vascular neurocognitive disorder 09/05/2019  ? Major depressive disorder with anxious distress 09/05/2019  ? Coag negative Staphylococcus bacteremia 05/21/2019  ? Acute blood loss anemia 05/21/2019  ? Anemia of chronic disease 05/21/2019  ? Anemia in chronic kidney disease 05/21/2019  ? SSS (sick sinus syndrome) ([place])   ? Paroxysmal atrial fibrillation (HCC)   ? Acute metabolic encephalopathy   ? Retroperitoneal hematoma   ? Muscle hemorrhage   ? S/P placement of cardiac pacemaker   ? SIRS (systemic inflammatory response syndrome) ([place]) 05/15/2019  ? Acute encephalopathy 05/15/2019  ? Status post coronary artery stent placement   ? CAD S/P percutaneous coronary angioplasty 09/27/2018  ? Abnormal nuclear stress test   ? Pain of left  shoulder joint on movement 06/16/2018  ? Closed fracture of upper end of humerus 06/16/2018  ? Pain in right knee 04/12/2018  ? Spinal stenosis of lumbar region 02/28/2018  ? On anticoagulant therapy 04/22/2017  ? Tachycardia-bradycardia syndrome ([place]) 12/23/2016  ? Accelerated hypertension   ? Acute on chronic diastolic CHF (congestive heart failure) ([place]) 08/09/2016  ? Elevated troponin 08/09/2016  ? Hypertensive urgency 08/08/2016  ? Paroxysmal atrial flutter ([place]) 08/08/2016  ? Coronary artery disease   ? Hypertensive heart disease   ? CKD (chronic kidney disease) stage 4, GFR 15-29 ml/min (HCC)   ? Hypothyroidism   ? Acute anemia   ? Pain in the chest   ? Shortness of breath 05/23/2016  ? Pre-syncope 05/23/2016  ? Chest tightness 09/16/2014  ? Osteoarthritis of right knee 07/02/2014  ? Acute medial meniscus tear of right knee 07/02/2014  ? Diabetic peripheral neuropathy associated with type 1 diabetes mellitus ([place]) 09/05/2013  ? Edema of extremities 04/18/2013  ? Cellulitis 04/18/2013  ? Hypertension associated with diabetes ([place]) 01/06/2012  ? Acute renal failure,admitted 01/03/12, after admission for diastolic chf 16/60/6301  ? S/P CABG (coronary artery bypass graft), 12/04/11 12/07/2011  ? Hyperlipidemia associated with type 2 diabetes mellitus ([place]) 12/03/2011  ? NSTEMI (non-ST elevated myocardial infarction) ([place]) 12/01/2011  ? Family history of early CAD 12/01/2011  ? Insulin dependent type 2 diabetes mellitus ([place]) 12/14/2010  ? DYSPHAGIA UNSPECIFIED 12/14/2010  ? ABDOMINAL PAIN, EPIGASTRIC 12/14/2010  ? ESOPHAGEAL STRICTURE 11/28/2009  ? GERD 11/28/2009  ? FLATULENCE-GAS-BLOATING 11/28/2009  ? CONSTIPATION, CHRONIC 12/19/2007  ? SALPINGO-OOPHORITIS 12/19/2007  ?  MENOPAUSE, SURGICAL 12/19/2007  ? BACK PAIN, LUMBAR 12/19/2007  ? ANKLE INJURY, RIGHT 12/19/2007  ? TOTAL KNEE REPLACEMENT, LEFT, HX OF 12/19/2007  ? ? ?Orientation RESPIRATION BLADDER Height & Weight   ?  ?Situation, Place, Time, Self ? O2 (Nasal  cannula 4L) Incontinent, External catheter Weight: 136 lb 7.4 oz (61.9 kg) ?Height:  5' (152.4 cm)  ?BEHAVIORAL SYMPTOMS/MOOD NEUROLOGICAL BOWEL NUTRITION STATUS  ?    Continent Diet (See DC Summary)  ?AMBULATORY STATUS COMMUNICATION OF NEEDS Skin   ?Limited Assist Verbally Normal ?  ?  ?  ?    ?     ?     ? ? ?Personal Care Assistance Level of Assistance  ?Bathing, Feeding, Dressing Bathing Assistance: Limited assistance ?Feeding assistance: Independent ?Dressing Assistance: Limited assistance ?   ? ?Functional Limitations Info  ?Sight, Hearing, Speech Sight Info: Adequate ?Hearing Info: Impaired ?Speech Info: Adequate  ? ? ?SPECIAL CARE FACTORS FREQUENCY  ?PT (By licensed PT), OT (By licensed OT)   ?  ?PT Frequency: 5x per week ?OT Frequency: 5x per week ?  ?  ?  ?   ? ? ?Contractures Contractures Info: Not present  ? ? ?Additional Factors Info  ?Code Status, Allergies, Insulin Sliding Scale Code Status Info: DNR ?Allergies Info: Clonidine Derivatives, Clonidine Hcl, Crestor (Rosuvastatin), Losartan Potassium, Sulfa Antibiotics, Epinephrine, Hydralazine Hcl ?  ?Insulin Sliding Scale Info: insulin aspart (novoLOG) injection 0-9 Units ?  ?   ? ?Current Medications (01/06/2022):  This is the current hospital active medication list ?Current Facility-Administered Medications  ?Medication Dose Route Frequency Provider Last Rate Last Admin  ? acetaminophen (TYLENOL) tablet 650 mg  650 mg Oral Q6H PRN [place] Eric J, DO   650 mg at 01/04/22 1406  ? albuterol (PROVENTIL) (2.5 MG/3ML) 0.083% nebulizer solution 2.5 mg  2.5 mg Nebulization Q2H PRN [place] Donnamarie Poag, DO   2.5 mg at 01/06/22 0957  ? allopurinol (ZYLOPRIM) tablet 100 mg  100 mg Oral Daily Tu, Ching T, DO   100 mg at 01/06/22 0849  ? amLODipine (NORVASC) tablet 5 mg  5 mg Oral Daily Tu, Ching T, DO   5 mg at 01/06/22 0850  ? aspirin EC tablet 81 mg  81 mg Oral Daily Tu, Ching T, DO   81 mg at 01/06/22 0849  ? azithromycin (ZITHROMAX) tablet 500 mg  500 mg Oral QHS  [place] Donnamarie Poag, DO      ? carvedilol (COREG) tablet 12.5 mg  12.5 mg Oral BID WC Tu, Ching T, DO   12.5 mg at 01/06/22 0849  ? cefTRIAXone (ROCEPHIN) 1 g in sodium chloride 0.9 % 100 mL IVPB  1 g Intravenous Q24H [place] Donnamarie Poag, DO 200 mL/hr at 01/05/22 2210 1 g at 01/05/22 2210  ? docusate sodium (COLACE) capsule 100 mg  100 mg Oral BID PRN Tu, Ching T, DO   100 mg at 01/05/22 0930  ? enoxaparin (LOVENOX) injection 30 mg  30 mg Subcutaneous Daily Donnamae Jude, RPH   30 mg at 01/06/22 0539  ? ferrous sulfate tablet 325 mg  325 mg Oral Once per day on Mon Thu Tu, Ching T, DO   325 mg at 01/04/22 7673  ? furosemide (LASIX) injection 40 mg  40 mg Intravenous Daily [place] Eric J, DO   40 mg at 01/06/22 4193  ? gabapentin (NEURONTIN) capsule 300 mg  300 mg Oral QHS Tu, Ching T, DO   300 mg at 01/05/22 2154  ? insulin  aspart (novoLOG) injection 0-9 Units  0-9 Units Subcutaneous TID WC Tu, Ching T, DO   3 Units at 01/06/22 1229  ? ipratropium-albuterol (DUONEB) 0.5-2.5 (3) MG/3ML nebulizer solution 3 mL  3 mL Nebulization Q4H PRN Tu, Ching T, DO   3 mL at 01/05/22 0521  ? isosorbide mononitrate (IMDUR) 24 hr tablet 30 mg  30 mg Oral Daily Tu, Ching T, DO   30 mg at 01/06/22 0849  ? levothyroxine (SYNTHROID) tablet 100 mcg  100 mcg Oral Q0600 Tu, Ching T, DO   100 mcg at 01/06/22 0541  ? nitroGLYCERIN (NITROSTAT) SL tablet 0.4 mg  0.4 mg Sublingual Q5 min PRN Pattricia Boss, MD      ? pantoprazole (PROTONIX) EC tablet 40 mg  40 mg Oral QAC breakfast Tu, Ching T, DO   40 mg at 01/06/22 0849  ? rosuvastatin (CRESTOR) tablet 10 mg  10 mg Oral QHS Tu, Ching T, DO   10 mg at 01/05/22 2154  ? ? ? ?Discharge Medications: ?Please see discharge summary for a list of discharge medications. ? ?Relevant Imaging Results: ? ?Relevant Lab Results: ? ? ?Additional Information ?[facility] COVID-19 Vaccine 01/07/2020 , 12/17/2019 SS# [id number] ? ?Lissa Morales Breena [place], LCSW ? ? ? ? ?

## 2022-01-06 NOTE — TOC Initial Note (Signed)
Transition of Care (TOC) - Initial/Assessment Note  ? ? ?Patient Details  ?Name: [name] ?MRN: [id number] ?Date of Birth: 1941-02-08 ? ?Transition of Care (TOC) CM/SW Contact:    ?Benard Halsted, LCSW ?Phone Number: ?01/06/2022, 12:17 PM ? ?Clinical Narrative:                 ?CSW received consult for possible SNF placement at time of discharge. CSW spoke with patient and daughter at bedside. Daughter reported that patient's nephew lives with her but he works during the day as does daughter so they are currently unable to care for patient at the home given patient?s current physical needs and fall risk. Patient expressed understanding of PT recommendation and is agreeable to SNF placement at time of discharge for short term only. Patient reports preference for [facility] or [facility]. CSW discussed insurance authorization process and will provide Medicare SNF ratings list. Patient has received 2 COVID vaccines. CSW will send out referrals for review.  ? ?Skilled Nursing Rehab Facilities-   [email or web address]   Ratings out of 5 possible   ?Name Address  Phone # Quality Care Staffing Health Inspection Overall  ?[facility] [address], [place] '5 5 2 4  '$ ?[facility]  [address], [place] [phone] '4 2 5 5  '$ ?[facility] [place] '4 1 1 1  '$ ?[place] [address] [place] '2 2 4 4  '$ ?[facility] [address], [place] '1 1 2 1  '$ ?[facility] [place] '2 1 4 3  '$ ?[place] '5 2 2 3  '$ ?[facility] [address] [place] '5 2 2 3  '$ ?[facility] [place] [phone] '5 1 2 2  '$ ?[facility] [address], [place] [phone] '4 1 1 1  '$ ?[facility] [address] [place]  [phone] '4 1 2 1  '$ ?[facility] [place] [phone] '4 1 1 1  '$ ?        ?[place], [place]      ?[facility] [place] '4 2 3 3  '$ ?[facility] [facility], [place] '3 1 5 4  '$ ?[place] [address] [place] [address], [place] [phone] '2 1 1 1  '$ ?[facility] [address], [place] [phone] '2 2 3 3  '$ ?        ?[address] (no [facility]) [address], [place] [phone] '4 5 5 5  '$ ?[facility] (No Humana) [address] [place] [phone] '4 1 4 3  '$ ?[facility] (No UHC) [address], [place] [phone] '5 5 5 5  '$ ?[facility] [address], [place] [phone] '3 2 4 4  '$ ?[facility] [address] [place] '3 3 4 4  '$ ?[facility] [address] [place] '1 1 2 1  '$ ?[facility] [address], [place] [phone] '2 1 1 1  '$ ?[facility] [address] [place] [phone] '5 2 4 5  '$ ?[facility] [address], [place] '3 1 1 1  '$ ?[facility] [place], [place] '2 1 2 1  '$ ?        ?[facility] [address], [place] '1 1 1 1  '$ ?[facility]  Dr, Ellender Hose  [phone] '2 4 2 2  '$ ?[facility] [address], [place] [phone] '5 2 3 4  '$ ?[place] [address] [place] '2 1 1 1  '$ ?[place] (No Humana) [address] [place] '2 1 3 2  '$ ?[facility] [address] [place] [phone] '3 1 1 1  '$ ?        ?[facility], [place] '5 4 5 5  '$ ?[facility])  [address], [place] '2 2 3 3  '$ ?[facility]) [place], [place] '3 2 4 4  '$ ?[facility] [address], [place] '4 3 4 4  '$ ?[address] [place], [place] '3 3 1 1  '$ ?[facility]) [place], [place] '2 2 4 4  '$ ? ? ? ?Expected Discharge Plan: [place] ?Barriers to Discharge: Continued Medical Work up, Ship broker, SNF Pending bed offer ? ? ?Patient Goals and CMS Choice ?Patient states their goals for this hospitalization and ongoing recovery are:: Rehab ?CMS Medicare.gov Compare Post Acute Care list provided to:: Patient ?Choice offered to / list presented to : Patient, Adult Children ? ?Expected Discharge Plan and Services ?Expected Discharge Plan: [place] ?In-house Referral: Clinical Social Work ?  ?Post Acute Care Choice: [place] ?Living arrangements for the past 2 months: [place] ?                ?  ?  ?  ?  ?  ?  ?  ?  ?  ?  ? ?Prior Living Arrangements/Services ?Living arrangements for the past 2 months: [place] ?Lives with:: Relatives ([place]) ?Patient language and need for interpreter reviewed:: Yes ?Do you feel safe going back to the place where you live?: Yes      ?Need for Family Participation in Patient Care: Yes (Comment) ?Care giver support system in place?: Yes (comment) ?  ?Criminal Activity/Legal Involvement Pertinent to Current Situation/Hospitalization: No - Comment as needed ? ?Activities of Daily Living ?  ?  ? ?Permission Sought/Granted ?Permission sought to share information with : Facility Sport and exercise psychologist, Family Supports ?Permission granted to share information with : Yes, Verbal Permission Granted ? Share Information with NAME: [name] ? Permission granted to share info w AGENCY: SNFs ? Permission granted to share info w Relationship: Daughter ? Permission granted to share info w Contact Information: [phone] ? ?Emotional Assessment ?Appearance:: Appears stated age ?Attitude/Demeanor/Rapport: Engaged ?Affect (typically observed): Accepting, Appropriate (Hard  of Hearing) ?Orientation: : Oriented to Self, Oriented to Place, Oriented to  Time, Oriented to Situation ?Alcohol / Substance Use: Not Applicable ?Psych Involvement: No (comment) ? ?Admission diagnosis:  Community acquired pneumonia [J18.9] ?Acute respiratory failure with hypoxia ([place]) [J96.01] ?Chest pain, unspecified type [R07.9] ?Community acquired pneumonia of right lung, unspecified part of lung [J18.9] ?Patient Active Problem List  ? Diagnosis Date Noted  ? Community acquired pneumonia 01/03/2022  ? Acute renal failure superimposed on stage 4 chronic kidney disease ([place]) 01/03/2022  ? Acute respiratory failure with hypoxia ([place]) 01/03/2022  ? Chest pain 08/10/2021  ? CKD (chronic kidney disease), stage III ([place]) 08/10/2021  ? Acute kidney injury superimposed on chronic kidney disease ([place]) 10/03/2020  ? Nausea & vomiting 10/03/2020  ? Abdominal pain 10/03/2020  ? Mild vascular neurocognitive disorder 09/05/2019  ? Major depressive disorder with  anxious distress 09/05/2019  ? Coag negative Staphylococcus bacteremia 05/21/2019  ? Acute blood loss anemia 05/21/2019  ? Anemia of chronic disease 05/21/2019  ? Anemia in chronic kidney disease 05/21/2019  ? SSS (sick sinus syndrome) ([place])   ? Paroxysmal atrial fibrillation (HCC)   ? Acute metabolic encephalopathy   ? Retroperitoneal hematoma   ? Muscle hemorrhage   ? S/P placement of cardiac pacemaker   ? SIRS (systemic inflammatory response syndrome) ([place]) 05/15/2019  ? Acute encephalopathy 05/15/2019  ? Status post coronary artery stent placement   ? CAD S/P percutaneous coronary angioplasty 09/27/2018  ? Abnormal nuclear stress test   ? Pain of left shoulder joint on movement 06/16/2018  ? Closed fracture of upper end of humerus 06/16/2018  ? Pain in right knee 04/12/2018  ? Spinal stenosis of lumbar region 02/28/2018  ? On anticoagulant therapy 04/22/2017  ? Tachycardia-bradycardia syndrome ([place]) 12/23/2016  ? Accelerated hypertension   ? Acute on chronic  diastolic CHF (congestive heart failure) ([place]) 08/09/2016  ? Elevated troponin 08/09/2016  ? Hypertensive urgency 08/08/2016  ? Paroxysmal atrial flutter ([place]) 08/08/2016  ? Coronary artery disease   ? Hypertensive hea

## 2022-01-07 ENCOUNTER — Inpatient Hospital Stay (HOSPITAL_COMMUNITY): Payer: Medicare Other

## 2022-01-07 LAB — BASIC METABOLIC PANEL
Anion gap: 12 (ref 5–15)
BUN: 83 mg/dL — ABNORMAL HIGH (ref 8–23)
CO2: 21 mmol/L — ABNORMAL LOW (ref 22–32)
Calcium: 7.9 mg/dL — ABNORMAL LOW (ref 8.9–10.3)
Chloride: 103 mmol/L (ref 98–111)
Creatinine, Ser: 3.01 mg/dL — ABNORMAL HIGH (ref 0.44–1.00)
GFR, Estimated: 15 mL/min — ABNORMAL LOW (ref >60)
Glucose, Bld: 190 mg/dL — ABNORMAL HIGH (ref 70–99)
Potassium: 4.1 mmol/L (ref 3.5–5.1)
Sodium: 136 mmol/L (ref 135–145)

## 2022-01-07 LAB — GLUCOSE, CAPILLARY
Glucose-Capillary: 153 mg/dL — ABNORMAL HIGH (ref 70–99)
Glucose-Capillary: 190 mg/dL — ABNORMAL HIGH (ref 70–99)
Glucose-Capillary: 205 mg/dL — ABNORMAL HIGH (ref 70–99)
Glucose-Capillary: 203 mg/dL — ABNORMAL HIGH (ref 70–99)

## 2022-01-07 MED ORDER — FUROSEMIDE 40 MG PO TABS
40.0000 mg | ORAL_TABLET | Freq: Every day | ORAL | Status: DC
  Administered 2022-01-08 – 2022-01-09 (×2): 40 mg via ORAL
  Filled 2022-01-07 (×2): qty 1

## 2022-01-07 NOTE — Progress Notes (Signed)
SATURATION QUALIFICATIONS: (This note is used to comply with regulatory documentation for home oxygen) ? ?Patient Saturations on Room Air at Rest = 95% ? ?Patient Saturations on Room Air while Ambulating = 91% ? ? ?Patient did not require oxygen while ambulating ? ? ?Arby Barrette, PT ?Acute Rehabilitation Services  ?Pager [phone] ?Office [phone] ? ?

## 2022-01-07 NOTE — Care Management Important Message (Signed)
Important Message ? ?Patient Details  ?Name: [name] ?MRN: [id number] ?Date of Birth: Feb 28, 1941 ? ? ?Medicare Important Message Given:  Yes ? ? ? ? ?Emmagrace Runkel ?01/07/2022, 2:46 PM ?

## 2022-01-07 NOTE — Progress Notes (Signed)
?PROGRESS NOTE ? ? ? ?[name]  SLH:[id number] DOB: 14-Oct-1941 DOA: 01/03/2022 ?PCP: Leeroy Cha, MD  ? ? ?Brief Narrative:  ?[name] is an 81 year old female with past medical history significant for paroxysmal atrial fibrillation, SSS s/p PPM, CAD s/p CABG x3, type 2 diabetes mellitus, chronic diastolic congestive heart failure, essential hypertension, hyperlipidemia who presented to [facility] ED on 3/12 with progressive shortness of breath.  Patient reports unable to sleep at night due to cough, chest tightness has been going on for last few days.  Patient currently lives alone but daughter goes to visit on occasion. ? ?In the ED, temperature 97.9 ?F, HR 68, RR 24, BP 176/48, SPO2 94% on room air.  WBC 12.9, hemoglobin 9.5, platelet count 146.  Sodium 139, potassium 4.4, chloride 104, CO2 22, glucose 131, BUN 64, creatinine 2.43.  VBG with pH 7.37, P CO2 45.0, PO2 61.  COVID-19 PCR negative.  Influenza A/B PCR negative.  Urinalysis unrevealing.  Chest x-ray with patchy opacities right lower lobe and right upper lobe concerning for pneumonia, cardiomegaly. Patient was empirically started on antibiotics.  Hospital service consulted for further evaluation and management of acute hypoxic respite failure secondary to pneumonia.  ? ? ?Assessment & Plan: ?  ?Assessment and Plan: ?* Acute respiratory failure with hypoxia (HCC) ?Patient presenting to ED with progressive shortness of breath, elevated WBC count of 12.9 with chest x-ray findings of patchy opacities right upper/lower lobe.  Patient was notably hypoxic into the 80s with exertion requiring 2 L nasal cannula. ?--continue antibiotics as below ?--Albuterol/DuoNebs PRN SOB/wheezing ?--lasix '40mg'$  IV q24h>transition to '40mg'$  PO tomorrow ?--Strict I's and O's and daily weights ?--Continue supplemental oxygen, maintain SPO2 >/= 92% ?--No oxygen requirement with ambulatory O2 screen today ? ?Community acquired pneumonia ?Chest x-ray on admission with  findings of patchy opacities right upper/lower lobe.  No aspiration noted on SLP evaluation. ?--WBC 12.9>>6.7 ?--Azithromycin '500mg'$  PO q24h ?--Ceftriaxone 1g IV q24h ?--Continue supplemental oxygen, maintain SPO2 >/= 92% ?--Incentive spirometry, flutter valve ? ?Acute renal failure superimposed on stage 4 chronic kidney disease ([place]) ?Baseline creatinine 1.9 - 2.4.  Creatinine 2.43 on admission. ?--Cr 2.43>2.35>2.58>2.93>3.01 ?--Avoid nephrotoxins, renally dose all medications ?--Monitor urinary output ?--BMP daily ? ?S/P CABG (coronary artery bypass graft), 12/04/11 ?Has elevated troponin at 27>32>46.  Denies chest pain.  Etiology likely secondary to type II demand ischemia in the setting of pneumonia/infection vs dCHF exacerbation.  ?--Aspirin 81 mg p.o. daily ?--Carvedilol 12.5 mg p.o. twice daily ?--Imdur 30 mg p.o. daily ? ?S/P placement of cardiac pacemaker ?Stable.  Monitor on telemetry ? ?Insulin dependent type 2 diabetes mellitus ([place]) ?Home regimen includes Lantus 20 units every morning.   ?--Semglee 5u Stollings daily ?--SSI for coverage ?--CBGs qAC/HS ? ?Hypertension associated with diabetes ([place]) ?--Amlodipine 5 mg p.o. daily ?--Carvedilol 12.5 mg twice daily ?--Imdur 30 mg p.o. daily ?--Holding home torsemide in favor of lasix as above ? ?Hyperlipidemia associated with type 2 diabetes mellitus ([place]) ?--Crestor 10 mg p.o. daily ? ?Paroxysmal atrial flutter ([place]) ?--Carvedilol 12.5 mg PO BID ?--Not on anticoagulation outpatient ?--Monitor on telemetry ? ?Acute on chronic diastolic CHF (congestive heart failure) ([place]) ?Patient continues with hypoxia, vascular congestion on chest x-ray with elevated BNP.  TTE 01/06/2022 with LVEF 55-60%, no regional wall motion abnormalities, grade 2 diastolic dysfunction, mild MR, IVC normal. ?--repeat TTE ?--Furosemide 40 mg IV q24h ?--Strict I's and O's and daily weights ?--Monitor renal function closely daily on IV diuresis ? ?Hypothyroidism ?--Levothyroxine 100 mcg p.o.  daily ? ? ? ? ?DVT prophylaxis: enoxaparin (LOVENOX) injection 30 mg Start: 01/05/22 1000 ? ?  Code Status: DNR ?Family Communication: No family present at bedside this morning ? ?Disposition Plan:  ?Level of care: Telemetry Medical ?Status is: Inpatient ?Remains inpatient appropriate because: IV antibiotics, remains on oxygen which she is not dependent at baseline, plan discharge to SNF when medically ready ?  ? ?Consultants:  ?None ? ?Procedures:  ?None ? ?Antimicrobials:  ?Azithromycin 3/12>> ?Ceftriaxone 3/12>> ? ? ?Subjective: ?Patient seen examined bedside, resting comfortably.  No family present.  Reports breathing continues to slowly improve daily.  Now down to 2 L nasal cannula this morning.  Continues with poor appetite.  Worked with PT this morning with continued plans of SNF placement.  No significant desaturation with ambulatory O2 screen today.  No other questions or concerns at this time.  Denies headache, no dizziness, no chest pain, palpitations, no fever/chills/night sweats, no nausea cefonicid diarrhea, no abdominal pain, no weakness, no fatigue, no paresthesia.  No acute events overnight per nursing staff. ? ?Objective: ?Vitals:  ? 01/07/22 0400 01/07/22 0500 01/07/22 0807 01/07/22 1200  ?BP: (!) 131/42  138/86 (!) 125/40  ?Pulse: 60  63 61  ?Resp: '18  15 16  '$ ?Temp: 98.6 ?F (37 ?C)  98.4 ?F (36.9 ?C) 97.9 ?F (36.6 ?C)  ?TempSrc: Oral  Oral Oral  ?SpO2: 97% 98% 96% 92%  ?Weight: 62.2 kg     ?Height:      ? ? ?Intake/Output Summary (Last 24 hours) at 01/07/2022 1356 ?Last data filed at 01/07/2022 1308 ?Gross per 24 hour  ?Intake 240 ml  ?Output 450 ml  ?Net -210 ml  ? ?Filed Weights  ? 01/05/22 0351 01/06/22 0428 01/07/22 0400  ?Weight: 60.8 kg 61.9 kg 62.2 kg  ? ? ?Examination: ? ?Physical Exam: ?GEN: NAD, alert and oriented x 3, elderly in appearance ?HEENT: NCAT, PERRL, EOMI, sclera clear, MMM ?PULM: Crackles notable right upper/lower lobe, no wheezing, normal respiratory effort without accessory  muscle use, on 2 L nasal cannula.   ?CV: RRR w/o M/G/R ?GI: abd soft, NTND, NABS, no R/G/M ?MSK: no peripheral edema, muscle strength globally intact 5/5 bilateral upper/lower extremities ?NEURO: CN II-XII intact, no focal deficits, sensation to light touch intact ?PSYCH: normal mood/affect ?Integumentary: dry/intact, no rashes or wounds ? ? ? ?Data Reviewed: I have personally reviewed following labs and imaging studies ? ?CBC: ?Recent Labs  ?Lab 01/03/22 ?1902 01/03/22 ?1918 01/03/22 ?2242 01/04/22 ?6578 01/05/22 ?0116 01/06/22 ?4696  ?WBC 12.9*  --   --  9.6 10.3 6.7  ?HGB 9.5* 9.5* 9.5* 8.5* 8.2* 7.8*  ?HCT 28.5* 28.0* 28.0* 25.5* 24.2* 22.8*  ?MCV 100.7*  --   --  100.4* 100.8* 99.6  ?PLT 146*  --   --  125* 112* 111*  ? ?Basic Metabolic Panel: ?Recent Labs  ?Lab 01/03/22 ?1902 01/03/22 ?1918 01/03/22 ?2242 01/04/22 ?2952 01/05/22 ?0116 01/06/22 ?8413 01/07/22 ?2440  ?NA 139   < > 141 143 136 138 136  ?K 4.4   < > 4.3 4.3 4.6 4.2 4.1  ?CL 104  --   --  108 104 103 103  ?CO2 22  --   --  24 20* 22 21*  ?GLUCOSE 131*  --   --  72 196* 148* 190*  ?BUN 64*  --   --  62* 67* 74* 83*  ?CREATININE 2.43*  --   --  2.35* 2.58* 2.93* 3.01*  ?CALCIUM 8.8*  --   --  8.6* 8.1* 8.1* 7.9*  ?MG  --   --   --   --  2.2  --   --   ? < > = values in this interval not displayed.  ? ?GFR: ?Estimated Creatinine Clearance: 11.9 mL/min (A) (by C-G formula based on SCr of 3.01 mg/dL (H)). ?Liver Function Tests: ?Recent Labs  ?Lab 01/03/22 ?1902  ?AST 35  ?ALT 29  ?ALKPHOS 83  ?BILITOT 0.9  ?PROT 6.9  ?ALBUMIN 3.7  ? ?No results for input(s): LIPASE, AMYLASE in the last 168 hours. ?No results for input(s): AMMONIA in the last 168 hours. ?Coagulation Profile: ?No results for input(s): INR, PROTIME in the last 168 hours. ?Cardiac Enzymes: ?No results for input(s): CKTOTAL, CKMB, CKMBINDEX, TROPONINI in the last 168 hours. ?BNP (last 3 results) ?No results for input(s): PROBNP in the last 8760 hours. ?HbA1C: ?No results for input(s): HGBA1C  in the last 72 hours. ?CBG: ?Recent Labs  ?Lab 01/06/22 ?1132 01/06/22 ?1557 01/06/22 ?2213 01/07/22 ?0805 01/07/22 ?1158  ?GLUCAP 218* 188* 215* 153* 190*  ? ?Lipid Profile: ?No results for input(s): CHOL, HD

## 2022-01-07 NOTE — TOC Progression Note (Signed)
Transition of Care (TOC) - Progression Note  ? ? ?Patient Details  ?Name: [name] ?MRN: [id number] ?Date of Birth: October 14, 1941 ? ?Transition of Care (TOC) CM/SW Contact  ?Coralee Pesa, LCSWA ?Phone Number: ?01/07/2022, 2:43 PM ? ?Clinical Narrative:    ?CSW met with pt and daughter at bedside. They have chosen [facility], MD noted pt should be ready Saturday. [facility] is agreeable to a Saturday DC. Pt will need auth and updated covid test tomorrow. TOC will continue to follow for DC needs. ? ? ?Expected Discharge Plan: [place] ?Barriers to Discharge: Continued Medical Work up, Ship broker, SNF Pending bed offer ? ?Expected Discharge Plan and Services ?Expected Discharge Plan: [place] ?In-house Referral: Clinical Social Work ?  ?Post Acute Care Choice: [place] ?Living arrangements for the past 2 months: [place] ?                ?  ?  ?  ?  ?  ?  ?  ?  ?  ?  ? ? ?Social Determinants of Health (SDOH) Interventions ?  ? ?Readmission Risk Interventions ?No flowsheet data found. ? ?

## 2022-01-07 NOTE — Progress Notes (Signed)
Physical Therapy Treatment ?Patient Details ?Name: [name] ?MRN: [id number] ?DOB: [birth year] ?Today's Date: 01/07/2022 ? ? ?History of Present Illness 81 y.o. female  who presents 01/03/22 with increasing shortness of breath. +CAP, PMH significant of HOH, CKD, paroxsymal atrial fibrillation, SSS s/p pacemaker, CAD s/p CABG x3, insulin-dependent I5OY, chronic diastolic HF, HTN, and HLD ? ?  ?PT Comments  ? ? Patient continues to require moderate cues for proper safe use of RW. Continues to be limited by dyspnea (required multiple standing rest breaks when walking 60 ft with RW). Will benefit from SNF to regain prior functional status. ?   ?Recommendations for follow up therapy are one component of a multi-disciplinary discharge planning process, led by the attending physician.  Recommendations may be updated based on patient status, additional functional criteria and insurance authorization. ? ?Follow Up Recommendations ? Skilled nursing-short term rehab (<3 hours/day) ?  ?  ?Assistance Recommended at Discharge Frequent or constant Supervision/Assistance  ?Patient can return home with the following A little help with walking and/or transfers;A little help with bathing/dressing/bathroom;Assistance with cooking/housework;Assist for transportation;Help with stairs or ramp for entrance ?  ?Equipment Recommendations ? None recommended by PT  ?  ?Recommendations for Other Services   ? ? ?  ?Precautions / Restrictions Precautions ?Precautions: Fall;Other (comment) ?Precaution Comments: watch SpO2 ?Restrictions ?Weight Bearing Restrictions: No  ?  ? ?Mobility ? Bed Mobility ?  ?  ?  ?  ?  ?  ?  ?General bed mobility comments: up in recliner ?  ? ?Transfers ?Overall transfer level: Needs assistance ?Equipment used: Rolling walker (2 wheels) ?Transfers: Sit to/from Stand ?Sit to Stand: Min guard ?  ?  ?  ?  ?  ?General transfer comment: min guard for safety; no physical assist ?  ? ?Ambulation/Gait ?Ambulation/Gait  assistance: Min assist ?Gait Distance (Feet): 60 Feet ?Assistive device: Rolling walker (2 wheels) ?Gait Pattern/deviations: Step-through pattern, Decreased stride length, Drifts right/left ?Gait velocity: decr ?  ?  ?General Gait Details: Required multiple standing rest breaks due to dyspnea. Sats >=91% on room air throughout session ? ? ?Stairs ?  ?  ?  ?  ?  ? ? ?Wheelchair Mobility ?  ? ?Modified Rankin (Stroke Patients Only) ?  ? ? ?  ?Balance Overall balance assessment: Needs assistance ?Sitting-balance support: No upper extremity supported, Feet supported ?Sitting balance-Leahy Scale: Fair ?  ?  ?Standing balance support: During functional activity, Single extremity supported ?Standing balance-Leahy Scale: Poor ?Standing balance comment: Pt needs at least one UE support and min guard for static standing ?  ?  ?  ?  ?  ?  ?  ?  ?  ?  ?  ?  ? ?  ?Cognition Arousal/Alertness: Awake/alert ?Behavior During Therapy: [facility] for tasks assessed/performed ?Overall Cognitive Status: Within Functional Limits for tasks assessed ?  ?  ?  ?  ?  ?  ?  ?  ?  ?  ?  ?  ?  ?  ?  ?  ?General Comments: HOH and requires repetition of commands at times ?  ?  ? ?  ?Exercises General Exercises - Lower Extremity ?Ankle Circles/Pumps: AROM, Both, 10 reps ?Quad Sets: AROM, Both, 10 reps, Supine ?Other Exercises ?Other Exercises: sit to stand (with cues for sequencing with RW) x 5 reps ? ?  ?General Comments   ?  ?  ? ?Pertinent Vitals/Pain Pain Assessment ?Pain Assessment: Faces ?Faces Pain Scale: Hurts little more ?Pain Location: low back ?Pain  Descriptors / Indicators: Discomfort ?Pain Intervention(s): Limited activity within patient's tolerance, Monitored during session  ? ? ?Home Living   ?  ?  ?  ?  ?  ?  ?  ?  ?  ?   ?  ?Prior Function    ?  ?  ?   ? ?PT Goals (current goals can now be found in the care plan section) Acute Rehab PT Goals ?Patient Stated Goal: feel better ?Time For Goal Achievement: 01/19/22 ?Potential to Achieve  Goals: Good ?Progress towards PT goals: Progressing toward goals ? ?  ?Frequency ? ? ? Min 3X/week ? ? ? ?  ?PT Plan Current plan remains appropriate  ? ? ?Co-evaluation   ?  ?  ?  ?  ? ?  ?AM-PAC PT "6 Clicks" Mobility   ?Outcome Measure ? Help needed turning from your back to your side while in a flat bed without using bedrails?: A Little ?Help needed moving from lying on your back to sitting on the side of a flat bed without using bedrails?: A Little ?Help needed moving to and from a bed to a chair (including a wheelchair)?: A Little ?Help needed standing up from a chair using your arms (e.g., wheelchair or bedside chair)?: A Little ?Help needed to walk in hospital room?: A Little ?Help needed climbing 3-5 steps with a railing? : Total ?6 Click Score: 16 ? ?  ?End of Session Equipment Utilized During Treatment: Oxygen;Gait belt ?Activity Tolerance: Patient limited by fatigue ?Patient left: with call bell/phone within reach;with family/visitor present;in chair ?Nurse Communication: Mobility status (ambulatory oxygen saturation note completed) ?PT Visit Diagnosis: Unsteadiness on feet (R26.81);Other abnormalities of gait and mobility (R26.89);Muscle weakness (generalized) (M62.81) ?  ? ? ?Time: [id number] ?PT Time Calculation (min) (ACUTE ONLY): 31 min ? ?Charges:  $Gait Training: 8-22 mins ?$Therapeutic Exercise: 8-22 mins          ?          ? ? ?Arby Barrette, PT ?Acute Rehabilitation Services  ?Pager [phone] ?Office [phone] ? ? ? ?Jeanie Cooks Remi Lopata ?01/07/2022, 12:24 PM ? ?

## 2022-01-08 DIAGNOSIS — R040 Epistaxis: Secondary | ICD-10-CM | POA: Diagnosis not present

## 2022-01-08 LAB — RESP PANEL BY RT-PCR (FLU A&B, COVID) ARPGX2
Influenza A by PCR: NEGATIVE
Influenza B by PCR: NEGATIVE
SARS Coronavirus 2 by RT PCR: NEGATIVE

## 2022-01-08 LAB — BASIC METABOLIC PANEL
Anion gap: 14 (ref 5–15)
BUN: 86 mg/dL — ABNORMAL HIGH (ref 8–23)
CO2: 19 mmol/L — ABNORMAL LOW (ref 22–32)
Calcium: 8.3 mg/dL — ABNORMAL LOW (ref 8.9–10.3)
Chloride: 104 mmol/L (ref 98–111)
Creatinine, Ser: 2.85 mg/dL — ABNORMAL HIGH (ref 0.44–1.00)
GFR, Estimated: 16 mL/min — ABNORMAL LOW (ref >60)
Glucose, Bld: 195 mg/dL — ABNORMAL HIGH (ref 70–99)
Potassium: 4.2 mmol/L (ref 3.5–5.1)
Sodium: 137 mmol/L (ref 135–145)

## 2022-01-08 LAB — GLUCOSE, CAPILLARY
Glucose-Capillary: 183 mg/dL — ABNORMAL HIGH (ref 70–99)
Glucose-Capillary: 252 mg/dL — ABNORMAL HIGH (ref 70–99)
Glucose-Capillary: 213 mg/dL — ABNORMAL HIGH (ref 70–99)
Glucose-Capillary: 172 mg/dL — ABNORMAL HIGH (ref 70–99)

## 2022-01-08 LAB — CULTURE, BLOOD (ROUTINE X 2)
Culture: NO GROWTH
Special Requests: ADEQUATE

## 2022-01-08 MED ORDER — INSULIN GLARGINE-YFGN 100 UNIT/ML ~~LOC~~ SOLN
5.0000 [IU] | Freq: Every day | SUBCUTANEOUS | Status: DC
  Administered 2022-01-08 – 2022-01-09 (×2): 5 [IU] via SUBCUTANEOUS
  Filled 2022-01-08 (×3): qty 0.05

## 2022-01-08 NOTE — Progress Notes (Signed)
?PROGRESS NOTE ? ? ? ?[name]  OIN:[id number] DOB: December 05, 1940 DOA: 01/03/2022 ?PCP: Leeroy Cha, MD  ? ? ?Brief Narrative:  ?[name] is an 81 year old female with past medical history significant for paroxysmal atrial fibrillation, SSS s/p PPM, CAD s/p CABG x3, type 2 diabetes mellitus, chronic diastolic congestive heart failure, essential hypertension, hyperlipidemia who presented to [facility] ED on 3/12 with progressive shortness of breath.  Patient reports unable to sleep at night due to cough, chest tightness has been going on for last few days.  Patient currently lives alone but daughter goes to visit on occasion. ? ?In the ED, temperature 97.9 ?F, HR 68, RR 24, BP 176/48, SPO2 94% on room air.  WBC 12.9, hemoglobin 9.5, platelet count 146.  Sodium 139, potassium 4.4, chloride 104, CO2 22, glucose 131, BUN 64, creatinine 2.43.  VBG with pH 7.37, P CO2 45.0, PO2 61.  COVID-19 PCR negative.  Influenza A/B PCR negative.  Urinalysis unrevealing.  Chest x-ray with patchy opacities right lower lobe and right upper lobe concerning for pneumonia, cardiomegaly. Patient was empirically started on antibiotics.  Hospital service consulted for further evaluation and management of acute hypoxic respite failure secondary to pneumonia.  ? ? ?Assessment & Plan: ?  ?Assessment and Plan: ?* Acute respiratory failure with hypoxia (HCC) ?Patient presenting to ED with progressive shortness of breath, elevated WBC count of 12.9 with chest x-ray findings of patchy opacities right upper/lower lobe.  Patient was notably hypoxic into the 80s with exertion requiring 2 L nasal cannula. ?--continue antibiotics as below ?--Albuterol/DuoNebs PRN SOB/wheezing ?--Lasix '40mg'$  POdaily ?--Strict I's and O's and daily weights ?--Continue supplemental oxygen, maintain SPO2 >/= 92% ?--No oxygen requirement with ambulatory O2 screen 3/17; but was placed back on oxygen while moving from the bed to the chair this morning with SPO2 in  the low 90s per RN ? ?Community acquired pneumonia ?Chest x-ray on admission with findings of patchy opacities right upper/lower lobe.  No aspiration noted on SLP evaluation.  Repeat chest x-ray 3/16 shows improved airspace opacity right midlung and lung base.  To complete 5-day course of azithromycin and ceftriaxone today. ?--WBC 12.9>>6.7 ?--Continue supplemental oxygen, maintain SPO2 >/= 92% ?--Incentive spirometry, flutter valve ? ?Acute renal failure superimposed on stage 4 chronic kidney disease ([place]) ?Baseline creatinine 1.9 - 2.4.  Creatinine 2.43 on admission. ?--Cr 2.43>2.35>2.58>2.93>3.01>2.85 ?--Avoid nephrotoxins, renally dose all medications ?--Monitor urinary output ?--BMP daily ? ?S/P CABG (coronary artery bypass graft), 12/04/11 ?Has elevated troponin at 27>32>46.  Denies chest pain.  Etiology likely secondary to type II demand ischemia in the setting of pneumonia/infection vs dCHF exacerbation.  ?--Aspirin discontinued due to epistaxis ?--Carvedilol 12.5 mg p.o. twice daily ?--Imdur 30 mg p.o. daily ? ?S/P placement of cardiac pacemaker ?Stable.  Monitor on telemetry ? ?Insulin dependent type 2 diabetes mellitus ([place]) ?Home regimen includes Lantus 20 units every morning.   ?--Semglee 5u [place] daily ?--SSI for coverage ?--CBGs qAC/HS ? ?Hypertension associated with diabetes ([place]) ?--Amlodipine 5 mg p.o. daily ?--Carvedilol 12.5 mg twice daily ?--Imdur 30 mg p.o. daily ?--Holding home torsemide in favor of lasix as above ? ?Hyperlipidemia associated with type 2 diabetes mellitus ([place]) ?--Crestor 10 mg p.o. daily ? ?Paroxysmal atrial flutter ([place]) ?--Carvedilol 12.5 mg PO BID ?--Not on anticoagulation outpatient ?--Monitor on telemetry ? ?Epistaxis ?Overnight, patient developed nosebleed to right nare.  Aspirin was discontinued and now resolved.  Likely secondary to dry nares from oxygen use. ? ?Acute on chronic diastolic CHF (congestive heart failure) (  [place]) ?Patient continues with hypoxia, vascular  congestion on chest x-ray with elevated BNP.  TTE 01/06/2022 with LVEF 55-60%, no regional wall motion abnormalities, grade 2 diastolic dysfunction, mild MR, IVC normal. ?--Furosemide 40 PO q24h ?--Strict I's and O's and daily weights ? ?Hypothyroidism ?--Levothyroxine 100 mcg p.o. daily ? ? ? ? ?DVT prophylaxis:  ? ?  Code Status: DNR ?Family Communication: No family present at bedside this morning, updated patient's daughter via telephone yesterday afternoon ? ?Disposition Plan:  ?Level of care: Telemetry Medical ?Status is: Inpatient ?Remains inpatient appropriate because: IV antibiotics, anticipate likely discharge to SNF tomorrow ?  ? ?Consultants:  ?None ? ?Procedures:  ?None ? ?Antimicrobials:  ?Azithromycin 3/12>> ?Ceftriaxone 3/12>> ? ? ?Subjective: ?Patient seen examined bedside, resting comfortably.  Lying in bed.  Continues with significant generalized weakness and fatigue.  Weaned off of oxygen yesterday.  Nursing reports episode of epistaxis overnight in which aspirin was discontinued and now has resolved.  No family present this morning, daughter updated via telephone yesterday afternoon.  No other questions or concerns at this time.  Denies headache, no dizziness, no chest pain, palpitations, no fever/chills/night sweats, no nausea cefonicid diarrhea, no abdominal pain, no weakness, no fatigue, no paresthesia.  No acute events overnight per nursing staff. ? ?Objective: ?Vitals:  ? 01/08/22 0042 01/08/22 0540 01/08/22 1040 01/08/22 1211  ?BP: (!) 145/55 (!) 149/48  (!) 145/60  ?Pulse: 67 63 62 76  ?Resp: (!) 22 (!) 23 (!) 21 16  ?Temp: 98.4 ?F (36.9 ?C) 98.6 ?F (37 ?C)  97.6 ?F (36.4 ?C)  ?TempSrc: Oral Oral  Oral  ?SpO2: 90% 91% 100% 96%  ?Weight:  60.2 kg    ?Height:      ? ? ?Intake/Output Summary (Last 24 hours) at 01/08/2022 1216 ?Last data filed at 01/08/2022 7829 ?Gross per 24 hour  ?Intake 460 ml  ?Output 670 ml  ?Net -210 ml  ? ?Filed Weights  ? 01/06/22 0428 01/07/22 0400 01/08/22 0540   ?Weight: 61.9 kg 62.2 kg 60.2 kg  ? ? ?Examination: ? ?Physical Exam: ?GEN: NAD, alert and oriented x 3, elderly in appearance ?HEENT: NCAT, PERRL, EOMI, sclera clear, MMM ?PULM: Breath sounds slightly decreased bilateral bases, no wheezing/crackles, normal respiratory effort without accessory muscle use, on room air this morning. ?CV: RRR w/o M/G/R ?GI: abd soft, NTND, NABS, no R/G/M ?MSK: no peripheral edema, muscle strength globally intact 5/5 bilateral upper/lower extremities ?NEURO: CN II-XII intact, no focal deficits, sensation to light touch intact ?PSYCH: normal mood/affect ?Integumentary: dry/intact, no rashes or wounds ? ? ? ?Data Reviewed: I have personally reviewed following labs and imaging studies ? ?CBC: ?Recent Labs  ?Lab 01/03/22 ?1902 01/03/22 ?1918 01/03/22 ?2242 01/04/22 ?5621 01/05/22 ?0116 01/06/22 ?3086  ?WBC 12.9*  --   --  9.6 10.3 6.7  ?HGB 9.5* 9.5* 9.5* 8.5* 8.2* 7.8*  ?HCT 28.5* 28.0* 28.0* 25.5* 24.2* 22.8*  ?MCV 100.7*  --   --  100.4* 100.8* 99.6  ?PLT 146*  --   --  125* 112* 111*  ? ?Basic Metabolic Panel: ?Recent Labs  ?Lab 01/04/22 ?5784 01/05/22 ?0116 01/06/22 ?6962 01/07/22 ?9528 01/08/22 ?0740  ?NA 143 136 138 136 137  ?K 4.3 4.6 4.2 4.1 4.2  ?CL 108 104 103 103 104  ?CO2 24 20* 22 21* 19*  ?GLUCOSE 72 196* 148* 190* 195*  ?BUN 62* 67* 74* 83* 86*  ?CREATININE 2.35* 2.58* 2.93* 3.01* 2.85*  ?CALCIUM 8.6* 8.1* 8.1* 7.9* 8.3*  ?MG  --  2.2  --   --   --   ? ?GFR: ?Estimated Creatinine Clearance: 12.3 mL/min (A) (by C-G formula based on SCr of 2.85 mg/dL (H)). ?Liver Function Tests: ?Recent Labs  ?Lab 01/03/22 ?1902  ?AST 35  ?ALT 29  ?ALKPHOS 83  ?BILITOT 0.9  ?PROT 6.9  ?ALBUMIN 3.7  ? ?No results for input(s): LIPASE, AMYLASE in the last 168 hours. ?No results for input(s): AMMONIA in the last 168 hours. ?Coagulation Profile: ?No results for input(s): INR, PROTIME in the last 168 hours. ?Cardiac Enzymes: ?No results for input(s): CKTOTAL, CKMB, CKMBINDEX, TROPONINI in the last  168 hours. ?BNP (last 3 results) ?No results for input(s): PROBNP in the last 8760 hours. ?HbA1C: ?No results for input(s): HGBA1C in the last 72 hours. ?CBG: ?Recent Labs  ?Lab 01/07/22 ?1158 01/07/22 ?1550 03/16/2

## 2022-01-08 NOTE — Progress Notes (Signed)
Occupational Therapy Treatment ?Patient Details ?Name: [name] ?MRN: [id number] ?DOB: 03/29/1941 ?Today's Date: 01/08/2022 ? ? ?History of present illness 81 y.o. female  who presents 01/03/22 with increasing shortness of breath. +CAP, PMH significant of HOH, CKD, paroxsymal atrial fibrillation, SSS s/p pacemaker, CAD s/p CABG x3, insulin-dependent J1OA, chronic diastolic HF, HTN, and HLD ?  ?OT comments ? Pt currently completes selfcare tasks at a min to mod assist level overall.  O2 sats remained greater than 91% on room air throughout activity as well with HR maintained in the mid 60s.  Will continue to benefit from continued acute care OT with progression to SNF for continued strengthening and ADL independence.   ? ?Recommendations for follow up therapy are one component of a multi-disciplinary discharge planning process, led by the attending physician.  Recommendations may be updated based on patient status, additional functional criteria and insurance authorization. ?   ?Follow Up Recommendations ? Skilled nursing-short term rehab (<3 hours/day)  ?  ?Assistance Recommended at Discharge Frequent or constant Supervision/Assistance  ?Patient can return home with the following ? A little help with walking and/or transfers;A little help with bathing/dressing/bathroom;Assistance with cooking/housework;Help with stairs or ramp for entrance;Assist for transportation ?  ?Equipment Recommendations ? None recommended by OT  ?  ?Recommendations for Other Services   ? ?  ?Precautions / Restrictions Precautions ?Precautions: Fall;Other (comment) ?Precaution Comments: watch SpO2 ?Restrictions ?Weight Bearing Restrictions: No  ? ? ?  ? ?Mobility Bed Mobility ?Overal bed mobility: Needs Assistance ?Bed Mobility: Sit to Supine ?  ?  ?Supine to sit: Supervision ?  ?  ?General bed mobility comments: With US of the bed rail ?  ? ?Transfers ?Overall transfer level: Needs assistance ?Equipment used: Rolling walker (2  wheels) ?Transfers: Sit to/from Stand ?Sit to Stand: Min assist ?  ?  ?Step pivot transfers: Min assist ?  ?  ?  ?  ?  ?Balance Overall balance assessment: Needs assistance ?Sitting-balance support: No upper extremity supported, Feet supported ?Sitting balance-Leahy Scale: Fair ?  ?  ?Standing balance support: During functional activity, Single extremity supported ?Standing balance-Leahy Scale: Poor ?Standing balance comment: Pt needs at least one UE support and min guard for static standing ?  ?  ?  ?  ?  ?  ?  ?  ?  ?  ?  ?   ? ?ADL either performed or assessed with clinical judgement  ? ?ADL Overall ADL's : Needs assistance/impaired ?  ?  ?Grooming: Dance movement psychotherapist;Wash/dry hands;Oral care;Min guard;Standing ?  ?  ?  ?  ?  ?  ?  ?Lower Body Dressing: Moderate assistance ?Lower Body Dressing Details (indicate cue type and reason): for donning gripper socks sitting EOC ?Toilet Transfer: Minimal assistance;BSC/3in1;Rolling walker (2 wheels);Ambulation ?Toilet Transfer Details (indicate cue type and reason): simulated ?  ?  ?  ?  ?Functional mobility during ADLs: Minimal assistance ?General ADL Comments: Pt maintained at 92% or greater on room air with mobility to the sink and while standing to complete grooming tasks on room air.  HR maintained in the mid 60s as well with standing for over 10 mins. ?  ? ? ?   ?   ? ?Cognition Arousal/Alertness: Awake/alert ?Behavior During Therapy: [facility] for tasks assessed/performed ?Overall Cognitive Status: Within Functional Limits for tasks assessed ?  ?  ?  ?  ?  ?  ?  ?  ?  ?  ?  ?  ?  ?  ?  ?  ?  ?  ?  ?   ?   ?   ?  General Comments    ? ? ?Pertinent Vitals/ Pain       Pain Assessment ?Pain Assessment: No/denies pain ? ?   ?   ? ?Frequency ? Min 2X/week  ? ? ? ? ?  ?Progress Toward Goals ? ?OT Goals(current goals can now be found in the care plan section) ? Progress towards OT goals: Progressing toward goals ? ?Acute Rehab OT Goals ?Potential to Achieve Goals: Good  ?Plan Discharge  plan remains appropriate   ? ?   ?AM-PAC OT "6 Clicks" Daily Activity     ?Outcome Measure ? ? Help from another person eating meals?: None ?Help from another person taking care of personal grooming?: A Little ?Help from another person toileting, which includes using toliet, bedpan, or urinal?: A Little ?Help from another person bathing (including washing, rinsing, drying)?: A Little ?Help from another person to put on and taking off regular upper body clothing?: A Little ?Help from another person to put on and taking off regular lower body clothing?: A Little ?6 Click Score: 19 ? ?  ?End of Session Equipment Utilized During Treatment: Gait belt;Rolling walker (2 wheels) ? ?OT Visit Diagnosis: Unsteadiness on feet (R26.81);Muscle weakness (generalized) (M62.81) ?  ?Activity Tolerance Patient tolerated treatment well ?  ?Patient Left in chair;with call bell/phone within reach;with family/visitor present ?  ?Nurse Communication Mobility status (Noted IV falling out of pt's arm.) ?  ? ?   ? ?Time: 1122-1201 ?OT Time Calculation (min): 39 min ? ?Charges: OT General Charges ?$OT Visit: 1 Visit ?OT Treatments ?$Self Care/Home Management : 38-52 mins ? ? ?Jonel Sick OTR/L ?01/08/2022, 5:13 PM ?

## 2022-01-08 NOTE — Assessment & Plan Note (Addendum)
Overnight, patient developed nosebleed to right nare.  Aspirin was discontinued and now resolved.  Likely secondary to dry nares from oxygen use.  May resume aspirin on 01/12/2022 if no further epistaxis noted. ?

## 2022-01-08 NOTE — Progress Notes (Addendum)
Patient got nose bleed at night, MD V. Rathore notify. MD concerned about holding blood thinner. No new order, BP WNL, patient required PRN oxygen. ?

## 2022-01-08 NOTE — TOC Progression Note (Addendum)
Transition of Care (TOC) - Progression Note  ? ? ?Patient Details  ?Name: [name] ?MRN: [id number] ?Date of Birth: [birth year] ? ?Transition of Care (TOC) CM/SW Contact  ?Benard Halsted, LCSW ?Phone Number: ?01/08/2022, 9:35 AM ? ?Clinical Narrative:    ?9:35am-CSW submitted clinicals to insurance for [facility], Ref# [id number].  ? ?11:38am-Insurance approval received: Auth ID# [id number], effective 01/08/2022-01/12/2022. CSW updated patient's daughter on plan. She is requesting [facility] for transport after 12pm on Saturday.  ? ?Expected Discharge Plan: [place] ?Barriers to Discharge: Continued Medical Work up, Ship broker, SNF Pending bed offer ? ?Expected Discharge Plan and Services ?Expected Discharge Plan: [place] ?In-house Referral: Clinical Social Work ?  ?Post Acute Care Choice: [place] ?Living arrangements for the past 2 months: [place] ?                ?  ?  ?  ?  ?  ?  ?  ?  ?  ?  ? ? ?Social Determinants of Health (SDOH) Interventions ?  ? ?Readmission Risk Interventions ?No flowsheet data found. ? ?

## 2022-01-09 DIAGNOSIS — R040 Epistaxis: Secondary | ICD-10-CM | POA: Diagnosis not present

## 2022-01-09 DIAGNOSIS — Z950 Presence of cardiac pacemaker: Secondary | ICD-10-CM | POA: Diagnosis not present

## 2022-01-09 DIAGNOSIS — G8929 Other chronic pain: Secondary | ICD-10-CM | POA: Diagnosis not present

## 2022-01-09 DIAGNOSIS — I5032 Chronic diastolic (congestive) heart failure: Secondary | ICD-10-CM | POA: Diagnosis not present

## 2022-01-09 DIAGNOSIS — E1169 Type 2 diabetes mellitus with other specified complication: Secondary | ICD-10-CM | POA: Diagnosis not present

## 2022-01-09 DIAGNOSIS — Z79899 Other long term (current) drug therapy: Secondary | ICD-10-CM | POA: Diagnosis not present

## 2022-01-09 DIAGNOSIS — J9621 Acute and chronic respiratory failure with hypoxia: Secondary | ICD-10-CM | POA: Diagnosis not present

## 2022-01-09 DIAGNOSIS — I10 Essential (primary) hypertension: Secondary | ICD-10-CM | POA: Diagnosis not present

## 2022-01-09 DIAGNOSIS — N184 Chronic kidney disease, stage 4 (severe): Secondary | ICD-10-CM | POA: Diagnosis not present

## 2022-01-09 DIAGNOSIS — I5033 Acute on chronic diastolic (congestive) heart failure: Secondary | ICD-10-CM | POA: Diagnosis not present

## 2022-01-09 DIAGNOSIS — I2510 Atherosclerotic heart disease of native coronary artery without angina pectoris: Secondary | ICD-10-CM | POA: Diagnosis not present

## 2022-01-09 DIAGNOSIS — E785 Hyperlipidemia, unspecified: Secondary | ICD-10-CM | POA: Diagnosis not present

## 2022-01-09 DIAGNOSIS — E119 Type 2 diabetes mellitus without complications: Secondary | ICD-10-CM | POA: Diagnosis not present

## 2022-01-09 DIAGNOSIS — Z794 Long term (current) use of insulin: Secondary | ICD-10-CM | POA: Diagnosis not present

## 2022-01-09 DIAGNOSIS — E1159 Type 2 diabetes mellitus with other circulatory complications: Secondary | ICD-10-CM | POA: Diagnosis not present

## 2022-01-09 DIAGNOSIS — E039 Hypothyroidism, unspecified: Secondary | ICD-10-CM | POA: Diagnosis not present

## 2022-01-09 DIAGNOSIS — Z743 Need for continuous supervision: Secondary | ICD-10-CM | POA: Diagnosis not present

## 2022-01-09 DIAGNOSIS — Z951 Presence of aortocoronary bypass graft: Secondary | ICD-10-CM | POA: Diagnosis not present

## 2022-01-09 DIAGNOSIS — I25709 Atherosclerosis of coronary artery bypass graft(s), unspecified, with unspecified angina pectoris: Secondary | ICD-10-CM | POA: Diagnosis not present

## 2022-01-09 DIAGNOSIS — N179 Acute kidney failure, unspecified: Secondary | ICD-10-CM | POA: Diagnosis not present

## 2022-01-09 DIAGNOSIS — I480 Paroxysmal atrial fibrillation: Secondary | ICD-10-CM | POA: Diagnosis not present

## 2022-01-09 DIAGNOSIS — J9601 Acute respiratory failure with hypoxia: Secondary | ICD-10-CM | POA: Diagnosis not present

## 2022-01-09 LAB — BASIC METABOLIC PANEL
Anion gap: 10 (ref 5–15)
BUN: 83 mg/dL — ABNORMAL HIGH (ref 8–23)
CO2: 23 mmol/L (ref 22–32)
Calcium: 8.1 mg/dL — ABNORMAL LOW (ref 8.9–10.3)
Chloride: 106 mmol/L (ref 98–111)
Creatinine, Ser: 2.54 mg/dL — ABNORMAL HIGH (ref 0.44–1.00)
GFR, Estimated: 18 mL/min — ABNORMAL LOW (ref >60)
Glucose, Bld: 174 mg/dL — ABNORMAL HIGH (ref 70–99)
Potassium: 4 mmol/L (ref 3.5–5.1)
Sodium: 139 mmol/L (ref 135–145)

## 2022-01-09 LAB — GLUCOSE, CAPILLARY
Glucose-Capillary: 130 mg/dL — ABNORMAL HIGH (ref 70–99)
Glucose-Capillary: 237 mg/dL — ABNORMAL HIGH (ref 70–99)

## 2022-01-09 MED ORDER — ASPIRIN EC 81 MG PO TBEC: 81.0000 mg | DELAYED_RELEASE_TABLET | Freq: Every day | ORAL | 11 refills | Status: DC

## 2022-01-09 MED ORDER — LANTUS SOLOSTAR 100 UNIT/ML ~~LOC~~ SOPN: 10.0000 [IU] | PEN_INJECTOR | Freq: Every morning | SUBCUTANEOUS | 11 refills | Status: DC

## 2022-01-09 MED ORDER — TORSEMIDE 20 MG PO TABS: 20.0000 mg | ORAL_TABLET | Freq: Every day | ORAL | Status: DC

## 2022-01-09 NOTE — TOC Progression Note (Signed)
Transition of Care (TOC) - Progression Note  ? ? ?Patient Details  ?Name: LOURIE RETZ ?MRN: [id number] ?Date of Birth: 1940-11-11 ? ?Transition of Care (TOC) CM/SW Contact  ?Joanne Chars, LCSW ?Phone Number: ?01/09/2022, 11:24 AM ? ?Clinical Narrative:   CSW spoke with Olivia Mackie at [facility], confirmed they can receive pt today, she asked CSW to call April RN at [facility] and inform her as well.  CSW spoke with April, who confirmed negative covid test and said they are ready to receive pt.   ? ? ? ?Expected Discharge Plan: [place] ?Barriers to Discharge: Continued Medical Work up, Ship broker, SNF Pending bed offer ? ?Expected Discharge Plan and Services ?Expected Discharge Plan: [place] ?In-house Referral: Clinical Social Work ?  ?Post Acute Care Choice: [place] ?Living arrangements for the past 2 months: [place] ?Expected Discharge Date: 01/09/22               ?  ?  ?  ?  ?  ?  ?  ?  ?  ?  ? ? ?Social Determinants of Health (SDOH) Interventions ?  ? ?Readmission Risk Interventions ?No flowsheet data found. ? ?

## 2022-01-09 NOTE — Progress Notes (Signed)
AVS, social worker's paperwork, and signed DNR form placed in discharge packet. Report called and given to Tammy, RN at [facility]. All questions answered to satisfaction. [facility] to transport pt with all belongings.  ?

## 2022-01-09 NOTE — Discharge Summary (Signed)
Physician Discharge Summary  [name] ZOX:[id number] DOB: 1941-06-18 DOA: 01/03/2022  PCP: Lorenda Ishihara, MD  Admit date: 01/03/2022 Discharge date: 01/09/2022  Admitted From: Home Disposition: [facility]  Recommendations for Outpatient Follow-up:  Follow up with PCP in 1-2 weeks Follow-up with nephrology and cardiology in 2-3 weeks Please obtain BMP in one week to ensure renal function stable  Home Health: N/A Equipment/Devices: Oxygen, 2 L per nasal cannula; continue to wean as able  Discharge Condition: Stable CODE STATUS: DNR Diet recommendation: Heart healthy/consistent carbohydrate diet  History of present illness:  [name] is an 81 year old female with past medical history significant for paroxysmal atrial fibrillation, SSS s/p PPM, CAD s/p CABG x3, type 2 diabetes mellitus, chronic diastolic congestive heart failure, essential hypertension, hyperlipidemia who presented to [facility] ED on 3/12 with progressive shortness of breath.  Patient reports unable to sleep at night due to cough, chest tightness has been going on for last few days.  Patient currently lives alone but daughter goes to visit on occasion.  In the ED, temperature 97.9 F, HR 68, RR 24, BP 176/48, SPO2 94% on room air.  WBC 12.9, hemoglobin 9.5, platelet count 146.  Sodium 139, potassium 4.4, chloride 104, CO2 22, glucose 131, BUN 64, creatinine 2.43.  VBG with pH 7.37, P CO2 45.0, PO2 61.  COVID-19 PCR negative.  Influenza A/B PCR negative.  Urinalysis unrevealing.  Chest x-ray with patchy opacities right lower lobe and right upper lobe concerning for pneumonia, cardiomegaly. Patient was empirically started on antibiotics.  Hospital service consulted for further evaluation and management of acute hypoxic respite failure secondary to pneumonia.  Hospital course:  Assessment and Plan: * Acute respiratory failure with hypoxia (HCC) Patient presenting to ED with progressive shortness  of breath, elevated WBC count of 12.9 with chest x-ray findings of patchy opacities right upper/lower lobe.  Patient was notably hypoxic into the 80s with exertion requiring 2 L nasal cannula.  Completed 5-day course of azithromycin and ceftriaxone for commune acquired pneumonia.  Continue torsemide 20 mg p.o. daily.  Recommend continue monitoring daily weights.  Had been weaned off of oxygen at rest, but does require 2 L with exertion likely secondary to deconditioning.  We will continue 2 L nasal cannula on discharge and recommend continue to wean as able.  Continue incentive spirometer, flutter valve.  Community acquired pneumonia Chest x-ray on admission with findings of patchy opacities right upper/lower lobe.  No aspiration noted on SLP evaluation.  Repeat chest x-ray 3/16 shows improved airspace opacity right midlung and lung base.  Completed 5-day course of azithromycin and ceftriaxone.  WBC count improved from 12.9-6.7 at time of discharge.  Continues require supplemental oxygen 2 L nasal cannula with exertion.  Continue to wean oxygen as able.  Continue incentive spirometry, flutter valve.  Acute renal failure superimposed on stage 4 chronic kidney disease (HCC) Baseline creatinine 1.9 - 2.4.  Creatinine peaked at 3.01 during hospitalization, improved to 2.54 at time of discharge.  Recommend repeat BMP 1 week.  Outpatient follow-up with nephrology, Dr. Kathrene Bongo 2-3 weeks  S/P CABG (coronary artery bypass graft), 12/04/11 Has elevated troponin at 27>32>46.  Denies chest pain.  Etiology likely secondary to type II demand ischemia in the setting of pneumonia/infection vs dCHF exacerbation. Carvedilol 12.5 mg p.o. twice daily, Imdur 30 mg p.o. daily.  May resume aspirin on 01/12/2022, held due to epistaxis during hospitalization.  S/P placement of cardiac pacemaker Stable.  Outpatient follow-up with electrophysiology as  scheduled.  Insulin dependent type 2 diabetes mellitus (HCC) Home regimen  includes Lantus 28 units every morning.  Reduced Lantus to 10 units subcutaneously daily until appetite improves.  Hypertension associated with diabetes (HCC) Amlodipine 5 mg p.o. daily, Carvedilol 12.5 mg twice daily, Imdur 30 mg p.o. daily, torsemide 20 mg p.o. daily.  Hyperlipidemia associated with type 2 diabetes mellitus (HCC) Crestor 10 mg p.o. daily  Paroxysmal atrial flutter (HCC) Carvedilol 12.5 mg PO BID, Not on anticoagulation outpatient   Epistaxis Overnight, patient developed nosebleed to right nare.  Aspirin was discontinued and now resolved.  Likely secondary to dry nares from oxygen use.  May resume aspirin on 01/12/2022 if no further epistaxis noted.  Acute on chronic diastolic CHF (congestive heart failure) (HCC) Vascular congestion on chest x-ray with elevated BNP.  TTE 01/06/2022 with LVEF 55-60%, no regional wall motion abnormalities, grade 2 diastolic dysfunction, mild MR, IVC normal.  Patient was treated with IV furosemide and now transition back to home torsemide 20 mg p.o. daily.  Recommend repeat BMP 1 week to ensure renal function remained stable and recommend monitoring of daily weights.  Outpatient follow with cardiology 2-3 weeks.  Hypothyroidism Levothyroxine 100 mcg p.o. daily       Discharge Diagnoses:  Principal Problem:   Acute respiratory failure with hypoxia (HCC) Active Problems:   Community acquired pneumonia   Acute renal failure superimposed on stage 4 chronic kidney disease (HCC)   S/P CABG (coronary artery bypass graft), 12/04/11   S/P placement of cardiac pacemaker   Insulin dependent type 2 diabetes mellitus (HCC)   Hypertension associated with diabetes (HCC)   Hyperlipidemia associated with type 2 diabetes mellitus (HCC)   Paroxysmal atrial flutter (HCC)   Hypothyroidism   Acute on chronic diastolic CHF (congestive heart failure) (HCC)   Epistaxis    Discharge Instructions  Discharge Instructions     Diet - low sodium heart  healthy   Complete by: As directed    Increase activity slowly   Complete by: As directed       Allergies as of 01/09/2022       Reactions   Clonidine Derivatives Other (See Comments)   Bradycardia and fatigue   Clonidine Hcl Other (See Comments)   Bradycardia   Crestor [rosuvastatin] Other (See Comments)   Made the patient feel tired/weak   Losartan Potassium Other (See Comments)   Hyperkalemia   Sulfa Antibiotics Other (See Comments)   Childhood reaction not recalled   Epinephrine Other (See Comments)   Abnormal feeling. Dental exam/injection of local w/ epi.   Hydralazine Hcl Anxiety, Other (See Comments)   Nervousness, anxiousness, GI upset        Medication List     TAKE these medications    acetaminophen 650 MG CR tablet Commonly known as: TYLENOL Take 650 mg by mouth every 8 (eight) hours as needed for pain.   allopurinol 100 MG tablet Commonly known as: ZYLOPRIM Take 100 mg by mouth daily.   amLODipine 5 MG tablet Commonly known as: NORVASC Take 1 tablet (5 mg total) by mouth daily.   aspirin EC 81 MG tablet Take 1 tablet (81 mg total) by mouth daily. Start taking on: January 12, 2022 What changed: These instructions start on January 12, 2022. If you are unsure what to do until then, ask your doctor or other care provider.   carvedilol 25 MG tablet Commonly known as: COREG Take 0.5 tablets (12.5 mg total) by mouth 2 (two) times daily.  diclofenac Sodium 1 % Gel Commonly known as: VOLTAREN Apply 2 g topically 2 (two) times daily as needed (pain).   docusate sodium 100 MG capsule Commonly known as: COLACE Take 100 mg by mouth in the morning and at bedtime.   FeroSul 325 (65 FE) MG tablet Generic drug: ferrous sulfate Take 325 mg by mouth 2 (two) times a week.   gabapentin 300 MG capsule Commonly known as: NEURONTIN Take 1 capsule (300 mg total) by mouth at bedtime.   isosorbide mononitrate 30 MG 24 hr tablet Commonly known as: IMDUR Take 1  tablet by mouth once daily What changed: when to take this   Lantus SoloStar 100 UNIT/ML Solostar Pen Generic drug: insulin glargine Inject 10 Units into the skin in the morning. What changed: how much to take   meclizine 25 MG tablet Commonly known as: ANTIVERT Take 25 mg by mouth daily as needed for dizziness.   nitroGLYCERIN 0.4 MG SL tablet Commonly known as: NITROSTAT Place 1 tablet (0.4 mg total) under the tongue every 5 (five) minutes as needed for chest pain (max 3 doses).   ondansetron 4 MG disintegrating tablet Commonly known as: ZOFRAN-ODT Take 4 mg by mouth every 8 (eight) hours as needed for nausea or vomiting (dissolve orally).   pantoprazole 40 MG tablet Commonly known as: PROTONIX Take 40 mg by mouth daily before breakfast.   rosuvastatin 10 MG tablet Commonly known as: CRESTOR Take 10 mg by mouth at bedtime.   Synthroid 100 MCG tablet Generic drug: levothyroxine Take 100 mcg by mouth at bedtime.   SYSTANE OP Place 1 drop into both eyes 2 (two) times daily as needed (dry eyes).   torsemide 20 MG tablet Commonly known as: DEMADEX Take 1 tablet (20 mg total) by mouth daily. What changed:  how much to take when to take this        Follow-up Information     Lorenda Ishihara, MD. Schedule an appointment as soon as possible for a visit in 1 week(s).   Specialty: Internal Medicine Contact information: [address] [place] [address] [phone]         Chrystie Nose, MD. Schedule an appointment as soon as possible for a visit in 2 week(s).   Specialty: Cardiology Contact information: [address] [place] [address] [phone]         Annie Sable, MD. Schedule an appointment as soon as possible for a visit in 2 week(s).   Specialty: Nephrology Contact information: [address] [place] [address] [phone]                Allergies  Allergen Reactions   Clonidine  Derivatives Other (See Comments)    Bradycardia and fatigue    Clonidine Hcl Other (See Comments)    Bradycardia   Crestor [Rosuvastatin] Other (See Comments)    Made the patient feel tired/weak   Losartan Potassium Other (See Comments)    Hyperkalemia   Sulfa Antibiotics Other (See Comments)    Childhood reaction not recalled   Epinephrine Other (See Comments)    Abnormal feeling. Dental exam/injection of local w/ epi.   Hydralazine Hcl Anxiety and Other (See Comments)    Nervousness, anxiousness, GI upset    Consultations: None   Procedures/Studies: DG CHEST PORT 1 VIEW  Result Date: 01/07/2022 CLINICAL DATA:  Shortness of breath EXAM: PORTABLE CHEST 1 VIEW COMPARISON:  01/06/2022 FINDINGS: Cardiomegaly status post median sternotomy with left chest multi lead  pacer. Improved airspace opacity of the right midlung and lung base. Additional retrocardiac atelectasis or consolidation. No new airspace opacity. Chronic fracture deformity of the proximal left humerus. IMPRESSION: 1. Improved airspace opacity of the right midlung and lung base. Additional retrocardiac atelectasis or consolidation. No new airspace opacity. 2. Cardiomegaly. Electronically Signed   By: Jearld Lesch M.D.   On: 01/07/2022 08:31   DG CHEST PORT 1 VIEW  Result Date: 01/06/2022 CLINICAL DATA:  Shortness of breath EXAM: PORTABLE CHEST 1 VIEW COMPARISON:  Radiograph 01/03/2022 FINDINGS: Unchanged dual chamber pacemaker leads. Unchanged cardiomediastinal silhouette with postsurgical changes of CABG. Intact sternotomy wires. There is multifocal airspace disease, increased in the right mid to lower lung. Patchy left medial basilar opacities. Diffuse interstitial opacities. No large pleural effusion. No visible pneumothorax. Unchanged chronic left proximal humerus deformity. No acute osseous abnormality. IMPRESSION: Pulmonary edema with multifocal airspace disease increased in the right mid to lower lung concerning for  worsening pneumonia and/or pulmonary edema. Electronically Signed   By: Caprice Renshaw M.D.   On: 01/06/2022 08:27   DG Chest Port 1 View  Result Date: 01/03/2022 CLINICAL DATA:  Shortness of breath EXAM: PORTABLE CHEST 1 VIEW COMPARISON:  08/10/2021 FINDINGS: Cardiomegaly. Pacer wires in the right atrium and right ventricle, unchanged. Prior CABG. Patchy airspace disease within the right lung concerning for pneumonia. Left lung clear. No effusions or acute bony abnormality. IMPRESSION: Patchy opacities in the right lower lobe and right upper lobe concerning for pneumonia Cardiomegaly. Electronically Signed   By: Charlett Nose M.D.   On: 01/03/2022 19:34   ECHOCARDIOGRAM COMPLETE  Result Date: 01/06/2022    ECHOCARDIOGRAM REPORT   Patient Name:   [name] Date of Exam: 01/06/2022 Medical Rec #:  [id number]       Height:       60.0 in Accession #:    [id number]      Weight:       136.5 lb Date of Birth:  [birth year]        BSA:          1.587 m Patient Age:    81 years        BP:           122/74 mmHg Patient Gender: F               HR:           63 bpm. Exam Location:  Inpatient Procedure: 2D Echo Indications:    dyspnea  History:        Patient has prior history of Echocardiogram examinations, most                 recent 08/11/2021. CAD, Prior CABG and Pacemaker,                 Arrythmias:Atrial Flutter; Risk Factors:Family History of                 Coronary Artery Disease, Hypertension, Diabetes and                 Dyslipidemia.  Sonographer:    Delcie Roch RDCS Referring Phys: [id number] Ola Fawver J [place] IMPRESSIONS  1. Left ventricular ejection fraction, by estimation, is 55 to 60%. The left ventricle has normal function. The left ventricle has no regional wall motion abnormalities. There is mild left ventricular hypertrophy. Left ventricular diastolic parameters are consistent with Grade II diastolic dysfunction (pseudonormalization). Elevated left atrial pressure.  2. Right ventricular systolic  function is mildly reduced. The right ventricular size is normal. There is mildly elevated pulmonary artery systolic pressure. The estimated right ventricular systolic pressure is 38.5 mmHg.  3. Left atrial size was mildly dilated.  4. The mitral valve is normal in structure. Mild mitral valve regurgitation.  5. Tricuspid valve regurgitation is mild to moderate.  6. The aortic valve is tricuspid. Aortic valve regurgitation is not visualized. Aortic valve sclerosis is present, with no evidence of aortic valve stenosis.  7. The inferior vena cava is normal in size with greater than 50% respiratory variability, suggesting right atrial pressure of 3 mmHg. FINDINGS  Left Ventricle: Left ventricular ejection fraction, by estimation, is 55 to 60%. The left ventricle has normal function. The left ventricle has no regional wall motion abnormalities. The left ventricular internal cavity size was normal in size. There is  mild left ventricular hypertrophy. Left ventricular diastolic parameters are consistent with Grade II diastolic dysfunction (pseudonormalization). Elevated left atrial pressure. Right Ventricle: The right ventricular size is normal. No increase in right ventricular wall thickness. Right ventricular systolic function is mildly reduced. There is mildly elevated pulmonary artery systolic pressure. The tricuspid regurgitant velocity  is 2.98 m/s, and with an assumed right atrial pressure of 3 mmHg, the estimated right ventricular systolic pressure is 38.5 mmHg. Left Atrium: Left atrial size was mildly dilated. Right Atrium: Right atrial size was normal in size. Pericardium: There is no evidence of pericardial effusion. Mitral Valve: The mitral valve is normal in structure. Mild mitral valve regurgitation. Tricuspid Valve: The tricuspid valve is normal in structure. Tricuspid valve regurgitation is mild to moderate. Aortic Valve: The aortic valve is tricuspid. Aortic valve regurgitation is not visualized. Aortic  valve sclerosis is present, with no evidence of aortic valve stenosis. Pulmonic Valve: The pulmonic valve was grossly normal. Pulmonic valve regurgitation is mild. Aorta: The aortic root is normal in size and structure. Venous: The inferior vena cava is normal in size with greater than 50% respiratory variability, suggesting right atrial pressure of 3 mmHg. IAS/Shunts: The interatrial septum was not well visualized.  LEFT VENTRICLE PLAX 2D LVIDd:         4.50 cm   Diastology LVIDs:         3.20 cm   LV e' medial:    6.15 cm/s LV PW:         1.10 cm   LV E/e' medial:  23.1 LV IVS:        0.90 cm   LV e' lateral:   7.05 cm/s LVOT diam:     1.50 cm   LV E/e' lateral: 20.1 LV SV:         37 LV SV Index:   24 LVOT Area:     1.77 cm  RIGHT VENTRICLE            IVC RV S prime:     8.03 cm/s  IVC diam: 1.90 cm TAPSE (M-mode): 1.4 cm LEFT ATRIUM             Index        RIGHT ATRIUM           Index LA diam:        4.45 cm 2.80 cm/m   RA Area:     10.70 cm LA Vol (A2C):   77.2 ml 48.65 ml/m  RA Volume:   21.00 ml  13.23 ml/m LA Vol (A4C):   45.9 ml 28.93 ml/m LA Biplane Vol: 60.5 ml 38.13  ml/m  AORTIC VALVE LVOT Vmax:   86.90 cm/s LVOT Vmean:  60.300 cm/s LVOT VTI:    0.212 m  AORTA Ao Root diam: 2.20 cm Ao Asc diam:  2.50 cm MITRAL VALVE                TRICUSPID VALVE MV Area (PHT): 2.52 cm     TR Peak grad:   35.5 mmHg MV Decel Time: 301 msec     TR Vmax:        298.00 cm/s MV E velocity: 142.00 cm/s MV A velocity: 105.00 cm/s  SHUNTS MV E/A ratio:  1.35         Systemic VTI:  0.21 m                             Systemic Diam: 1.50 cm Epifanio Lesches MD Electronically signed by Epifanio Lesches MD Signature Date/Time: 01/06/2022/7:39:57 PM    Final      Subjective: Patient seen examined bedside, resting comfortably.  Eating breakfast.  Continues with 2 L nasal cannula with SPO2 97% at rest.  Continues with global weakness, fatigue, shortness of breath with exertion.  Completed antibiotics for pneumonia  yesterday.  No other complaints or concerns at this time.  Denies headache, no dizziness, no chest pain, no palpitations, no fever/chills/night sweats, no nausea/vomiting/diarrhea, no abdominal pain, no paresthesias.  No acute events overnight per nursing staff.  Discharging to SNF for further rehabilitation today.  Discharge Exam: Vitals:   01/09/22 0337 01/09/22 0718  BP: (!) 136/49 (!) 142/50  Pulse: 67 62  Resp: 17 (!) 22  Temp: 97.8 F (36.6 C) 98.4 F (36.9 C)  SpO2: 99% 98%   Vitals:   01/08/22 2351 01/09/22 0337 01/09/22 0403 01/09/22 0718  BP: (!) 136/54 (!) 136/49  (!) 142/50  Pulse: 60 67  62  Resp: 16 17  (!) 22  Temp: 98.7 F (37.1 C) 97.8 F (36.6 C)  98.4 F (36.9 C)  TempSrc: Oral Axillary  Oral  SpO2: 93% 99%  98%  Weight:   60.8 kg   Height:        Physical Exam: GEN: NAD, alert and oriented x 3, elderly in appearance HEENT: NCAT, PERRL, EOMI, sclera clear, MMM PULM: CTAB w/o wheezes/crackles, normal respiratory effort, on 2 L nasal cannula with SPO2 97% at rest CV: RRR w/o M/G/R GI: abd soft, NTND, NABS, no R/G/M MSK: no peripheral edema, muscle strength globally intact 5/5 bilateral upper/lower extremities NEURO: CN II-XII intact, no focal deficits, sensation to light touch intact PSYCH: normal mood/affect Integumentary: dry/intact, no rashes or wounds    The results of significant diagnostics from this hospitalization (including imaging, microbiology, ancillary and laboratory) are listed below for reference.     Microbiology: Recent Results (from the past 240 hour(s))  Resp Panel by RT-PCR (Flu A&B, Covid) Nasopharyngeal Swab     Status: None   Collection Time: 01/03/22  7:55 PM   Specimen: Nasopharyngeal Swab; Nasopharyngeal(NP) swabs in vial transport medium  Result Value Ref Range Status   SARS Coronavirus 2 by RT PCR NEGATIVE NEGATIVE Final    Comment: (NOTE) SARS-CoV-2 target nucleic acids are NOT DETECTED.  The SARS-CoV-2 RNA is  generally detectable in upper respiratory specimens during the acute phase of infection. The lowest concentration of SARS-CoV-2 viral copies this assay can detect is 138 copies/mL. A negative result does not preclude SARS-Cov-2 infection and should not be used as  the sole basis for treatment or other patient management decisions. A negative result may occur with  improper specimen collection/handling, submission of specimen other than nasopharyngeal swab, presence of viral mutation(s) within the areas targeted by this assay, and inadequate number of viral copies(<138 copies/mL). A negative result must be combined with clinical observations, patient history, and epidemiological information. The expected result is Negative.  Fact Sheet for Patients:  BloggerCourse.com  Fact Sheet for Healthcare Providers:  SeriousBroker.it  This test is no t yet approved or cleared by the [place] FDA and  has been authorized for detection and/or diagnosis of SARS-CoV-2 by FDA under an Emergency Use Authorization (EUA). This EUA will remain  in effect (meaning this test can be used) for the duration of the COVID-19 declaration under Section 564(b)(1) of the Act, 21 U.S.C.section 360bbb-3(b)(1), unless the authorization is terminated  or revoked sooner.       Influenza A by PCR NEGATIVE NEGATIVE Final   Influenza B by PCR NEGATIVE NEGATIVE Final    Comment: (NOTE) The Xpert Xpress SARS-CoV-2/FLU/RSV plus assay is intended as an aid in the diagnosis of influenza from Nasopharyngeal swab specimens and should not be used as a sole basis for treatment. Nasal washings and aspirates are unacceptable for Xpert Xpress SARS-CoV-2/FLU/RSV testing.  Fact Sheet for Patients: BloggerCourse.com  Fact Sheet for Healthcare Providers: SeriousBroker.it  This test is not yet approved or cleared by the [place] FDA and has been authorized for detection and/or diagnosis of SARS-CoV-2 by FDA under an Emergency Use Authorization (EUA). This EUA will remain in effect (meaning this test can be used) for the duration of the COVID-19 declaration under Section 564(b)(1) of the Act, 21 U.S.C. section 360bbb-3(b)(1), unless the authorization is terminated or revoked.  Performed at [facility], [address], [place], [place] [address]   Blood culture (routine x 2)     Status: None   Collection Time: 01/03/22 10:33 PM   Specimen: BLOOD RIGHT ARM  Result Value Ref Range Status   Specimen Description BLOOD RIGHT ARM  Final   Special Requests   Final    BOTTLES DRAWN AEROBIC AND ANAEROBIC Blood Culture adequate volume   Culture   Final    NO GROWTH 5 DAYS Performed at [facility], [address] [place], [place] [address]    Report Status 01/08/2022 FINAL  Final  Resp Panel by RT-PCR (Flu A&B, Covid) Nasopharyngeal Swab     Status: None   Collection Time: 01/08/22 11:44 AM   Specimen: Nasopharyngeal Swab; Nasopharyngeal(NP) swabs in vial transport medium  Result Value Ref Range Status   SARS Coronavirus 2 by RT PCR NEGATIVE NEGATIVE Final    Comment: (NOTE) SARS-CoV-2 target nucleic acids are NOT DETECTED.  The SARS-CoV-2 RNA is generally detectable in upper respiratory specimens during the acute phase of infection. The lowest concentration of SARS-CoV-2 viral copies this assay can detect is 138 copies/mL. A negative result does not preclude SARS-Cov-2 infection and should not be used as the sole basis for treatment or other patient management decisions. A negative result may occur with  improper specimen collection/handling, submission of specimen other than nasopharyngeal swab, presence of viral mutation(s) within the areas targeted by this assay, and inadequate number of viral copies(<138 copies/mL). A negative result must be combined with clinical observations, patient  history, and epidemiological information. The expected result is Negative.  Fact Sheet for Patients:  BloggerCourse.com  Fact Sheet for Healthcare Providers:  SeriousBroker.it  This test is no t yet approved or cleared by the [place] and  has been authorized for detection and/or diagnosis of SARS-CoV-2 by FDA under an Emergency Use Authorization (EUA). This EUA will remain  in effect (meaning this test can be used) for the duration of the COVID-19 declaration under Section 564(b)(1) of the Act, 21 U.S.C.section 360bbb-3(b)(1), unless the authorization is terminated  or revoked sooner.       Influenza A by PCR NEGATIVE NEGATIVE Final   Influenza B by PCR NEGATIVE NEGATIVE Final    Comment: (NOTE) The Xpert Xpress SARS-CoV-2/FLU/RSV plus assay is intended as an aid in the diagnosis of influenza from Nasopharyngeal swab specimens and should not be used as a sole basis for treatment. Nasal washings and aspirates are unacceptable for Xpert Xpress SARS-CoV-2/FLU/RSV testing.  Fact Sheet for Patients: BloggerCourse.com  Fact Sheet for Healthcare Providers: SeriousBroker.it  This test is not yet approved or cleared by the [place] FDA and has been authorized for detection and/or diagnosis of SARS-CoV-2 by FDA under an Emergency Use Authorization (EUA). This EUA will remain in effect (meaning this test can be used) for the duration of the COVID-19 declaration under Section 564(b)(1) of the Act, 21 U.S.C. section 360bbb-3(b)(1), unless the authorization is terminated or revoked.  Performed at [facility], [address], [place], [place] [address]      Labs: BNP (last 3 results) Recent Labs    01/05/22 0116 01/06/22 0336  BNP 1,484.9* 1,115.1*   Basic Metabolic Panel: Recent Labs  Lab 01/05/22 0116 01/06/22 0336 01/07/22 0238 01/08/22 0740  01/09/22 0054  NA 136 138 136 137 139  K 4.6 4.2 4.1 4.2 4.0  CL 104 103 103 104 106  CO2 20* 22 21* 19* 23  GLUCOSE 196* 148* 190* 195* 174*  BUN 67* 74* 83* 86* 83*  CREATININE 2.58* 2.93* 3.01* 2.85* 2.54*  CALCIUM 8.1* 8.1* 7.9* 8.3* 8.1*  MG 2.2  --   --   --   --    Liver Function Tests: Recent Labs  Lab 01/03/22 1902  AST 35  ALT 29  ALKPHOS 83  BILITOT 0.9  PROT 6.9  ALBUMIN 3.7   No results for input(s): LIPASE, AMYLASE in the last 168 hours. No results for input(s): AMMONIA in the last 168 hours. CBC: Recent Labs  Lab 01/03/22 1902 01/03/22 1918 01/03/22 2242 01/04/22 0515 01/05/22 0116 01/06/22 0336  WBC 12.9*  --   --  9.6 10.3 6.7  HGB 9.5* 9.5* 9.5* 8.5* 8.2* 7.8*  HCT 28.5* 28.0* 28.0* 25.5* 24.2* 22.8*  MCV 100.7*  --   --  100.4* 100.8* 99.6  PLT 146*  --   --  125* 112* 111*   Cardiac Enzymes: No results for input(s): CKTOTAL, CKMB, CKMBINDEX, TROPONINI in the last 168 hours. BNP: Invalid input(s): POCBNP CBG: Recent Labs  Lab 01/08/22 0832 01/08/22 1207 01/08/22 1608 01/08/22 1939 01/09/22 0722  GLUCAP 183* 252* 213* 172* 130*   D-Dimer No results for input(s): DDIMER in the last 72 hours. Hgb A1c No results for input(s): HGBA1C in the last 72 hours. Lipid Profile No results for input(s): CHOL, HDL, LDLCALC, TRIG, CHOLHDL, LDLDIRECT in the last 72 hours. Thyroid function studies No results for input(s): TSH, T4TOTAL, T3FREE, THYROIDAB in the last 72 hours.  Invalid input(s): FREET3 Anemia work up No results for input(s): VITAMINB12, FOLATE, FERRITIN, TIBC, IRON, RETICCTPCT in the last 72 hours. Urinalysis    Component Value Date/Time  COLORURINE YELLOW 01/03/2022 2240   APPEARANCEUR CLEAR 01/03/2022 2240   LABSPEC 1.010 01/03/2022 2240   PHURINE 5.0 01/03/2022 2240   GLUCOSEU NEGATIVE 01/03/2022 2240   HGBUR NEGATIVE 01/03/2022 2240   BILIRUBINUR NEGATIVE 01/03/2022 2240   KETONESUR NEGATIVE 01/03/2022 2240   PROTEINUR  100 (A) 01/03/2022 2240   UROBILINOGEN 0.2 01/04/2012 1105   NITRITE NEGATIVE 01/03/2022 2240   LEUKOCYTESUR NEGATIVE 01/03/2022 2240   Sepsis Labs Invalid input(s): PROCALCITONIN,  WBC,  LACTICIDVEN Microbiology Recent Results (from the past 240 hour(s))  Resp Panel by RT-PCR (Flu A&B, Covid) Nasopharyngeal Swab     Status: None   Collection Time: 01/03/22  7:55 PM   Specimen: Nasopharyngeal Swab; Nasopharyngeal(NP) swabs in vial transport medium  Result Value Ref Range Status   SARS Coronavirus 2 by RT PCR NEGATIVE NEGATIVE Final    Comment: (NOTE) SARS-CoV-2 target nucleic acids are NOT DETECTED.  The SARS-CoV-2 RNA is generally detectable in upper respiratory specimens during the acute phase of infection. The lowest concentration of SARS-CoV-2 viral copies this assay can detect is 138 copies/mL. A negative result does not preclude SARS-Cov-2 infection and should not be used as the sole basis for treatment or other patient management decisions. A negative result may occur with  improper specimen collection/handling, submission of specimen other than nasopharyngeal swab, presence of viral mutation(s) within the areas targeted by this assay, and inadequate number of viral copies(<138 copies/mL). A negative result must be combined with clinical observations, patient history, and epidemiological information. The expected result is Negative.  Fact Sheet for Patients:  BloggerCourse.com  Fact Sheet for Healthcare Providers:  SeriousBroker.it  This test is no t yet approved or cleared by the [place] FDA and  has been authorized for detection and/or diagnosis of SARS-CoV-2 by FDA under an Emergency Use Authorization (EUA). This EUA will remain  in effect (meaning this test can be used) for the duration of the COVID-19 declaration under Section 564(b)(1) of the Act, 21 U.S.C.section 360bbb-3(b)(1), unless the authorization is  terminated  or revoked sooner.       Influenza A by PCR NEGATIVE NEGATIVE Final   Influenza B by PCR NEGATIVE NEGATIVE Final    Comment: (NOTE) The Xpert Xpress SARS-CoV-2/FLU/RSV plus assay is intended as an aid in the diagnosis of influenza from Nasopharyngeal swab specimens and should not be used as a sole basis for treatment. Nasal washings and aspirates are unacceptable for Xpert Xpress SARS-CoV-2/FLU/RSV testing.  Fact Sheet for Patients: BloggerCourse.com  Fact Sheet for Healthcare Providers: SeriousBroker.it  This test is not yet approved or cleared by the [place] FDA and has been authorized for detection and/or diagnosis of SARS-CoV-2 by FDA under an Emergency Use Authorization (EUA). This EUA will remain in effect (meaning this test can be used) for the duration of the COVID-19 declaration under Section 564(b)(1) of the Act, 21 U.S.C. section 360bbb-3(b)(1), unless the authorization is terminated or revoked.  Performed at [facility], [address], [place], [place] [address]   Blood culture (routine x 2)     Status: None   Collection Time: 01/03/22 10:33 PM   Specimen: BLOOD RIGHT ARM  Result Value Ref Range Status   Specimen Description BLOOD RIGHT ARM  Final   Special Requests   Final    BOTTLES DRAWN AEROBIC AND ANAEROBIC Blood Culture adequate volume   Culture   Final    NO GROWTH 5 DAYS Performed at [facility], [address] [place],  [place] [address]    Report Status 01/08/2022 FINAL  Final  Resp Panel by RT-PCR (Flu A&B, Covid) Nasopharyngeal Swab     Status: None   Collection Time: 01/08/22 11:44 AM   Specimen: Nasopharyngeal Swab; Nasopharyngeal(NP) swabs in vial transport medium  Result Value Ref Range Status   SARS Coronavirus 2 by RT PCR NEGATIVE NEGATIVE Final    Comment: (NOTE) SARS-CoV-2 target nucleic acids are NOT DETECTED.  The SARS-CoV-2 RNA is generally detectable  in upper respiratory specimens during the acute phase of infection. The lowest concentration of SARS-CoV-2 viral copies this assay can detect is 138 copies/mL. A negative result does not preclude SARS-Cov-2 infection and should not be used as the sole basis for treatment or other patient management decisions. A negative result may occur with  improper specimen collection/handling, submission of specimen other than nasopharyngeal swab, presence of viral mutation(s) within the areas targeted by this assay, and inadequate number of viral copies(<138 copies/mL). A negative result must be combined with clinical observations, patient history, and epidemiological information. The expected result is Negative.  Fact Sheet for Patients:  BloggerCourse.com  Fact Sheet for Healthcare Providers:  SeriousBroker.it  This test is no t yet approved or cleared by the [place] FDA and  has been authorized for detection and/or diagnosis of SARS-CoV-2 by FDA under an Emergency Use Authorization (EUA). This EUA will remain  in effect (meaning this test can be used) for the duration of the COVID-19 declaration under Section 564(b)(1) of the Act, 21 U.S.C.section 360bbb-3(b)(1), unless the authorization is terminated  or revoked sooner.       Influenza A by PCR NEGATIVE NEGATIVE Final   Influenza B by PCR NEGATIVE NEGATIVE Final    Comment: (NOTE) The Xpert Xpress SARS-CoV-2/FLU/RSV plus assay is intended as an aid in the diagnosis of influenza from Nasopharyngeal swab specimens and should not be used as a sole basis for treatment. Nasal washings and aspirates are unacceptable for Xpert Xpress SARS-CoV-2/FLU/RSV testing.  Fact Sheet for Patients: BloggerCourse.com  Fact Sheet for Healthcare Providers: SeriousBroker.it  This test is not yet approved or cleared by the [place] FDA and has  been authorized for detection and/or diagnosis of SARS-CoV-2 by FDA under an Emergency Use Authorization (EUA). This EUA will remain in effect (meaning this test can be used) for the duration of the COVID-19 declaration under Section 564(b)(1) of the Act, 21 U.S.C. section 360bbb-3(b)(1), unless the authorization is terminated or revoked.  Performed at [facility], [address], [place], [place] [address]      Time coordinating discharge: Over 30 minutes  SIGNED:   Alvira Philips [place], DO  [facility] 01/09/2022, 9:25 AM

## 2022-01-09 NOTE — TOC Transition Note (Signed)
Transition of Care (TOC) - CM/SW Discharge Note ? ? ?Patient Details  ?Name: [name] ?MRN: [id number] ?Date of Birth: 03-May-1941 ? ?Transition of Care [facility]) CM/SW Contact:  ?Joanne Chars, LCSW ?Phone Number: ?01/09/2022, 11:32 AM ? ? ?Clinical Narrative:   Pt discharging to [facility].  RN call [phone] for report.  ? ? ? ?Final next level of care: [place] ?Barriers to Discharge: Barriers Resolved ? ? ?Patient Goals and CMS Choice ?Patient states their goals for this hospitalization and ongoing recovery are:: Rehab ?CMS Medicare.gov Compare Post Acute Care list provided to:: Patient ?Choice offered to / list presented to : Patient, Adult Children ? ?Discharge Placement ?  ?           ?Patient chooses bed at: [place] [place] ?Patient to be transferred to facility by: [facility] ?Name of family member notified: daughter [name] ?Patient and family notified of of transfer: 01/09/22 ? ?Discharge Plan and Services ?In-house Referral: Clinical Social Work ?  ?Post Acute Care Choice: [place]          ?  ?  ?  ?  ?  ?  ?  ?  ?  ?  ? ?Social Determinants of Health (SDOH) Interventions ?  ? ? ?Readmission Risk Interventions ?No flowsheet data found. ? ? ? ? ?

## 2022-01-09 NOTE — Plan of Care (Signed)
  Problem: Education: Goal: Knowledge of General Education information will improve Description: Including pain rating scale, medication(s)/side effects and non-pharmacologic comfort measures Outcome: Progressing   Problem: Health Behavior/Discharge Planning: Goal: Ability to manage health-related needs will improve Outcome: Progressing   Problem: Clinical Measurements: Goal: Ability to maintain clinical measurements within normal limits will improve Outcome: Progressing Goal: Respiratory complications will improve Outcome: Progressing Goal: Cardiovascular complication will be avoided Outcome: Progressing   Problem: Nutrition: Goal: Adequate nutrition will be maintained Outcome: Progressing   Problem: Pain Managment: Goal: General experience of comfort will improve Outcome: Progressing   Problem: Safety: Goal: Ability to remain free from injury will improve Outcome: Progressing   Problem: Skin Integrity: Goal: Risk for impaired skin integrity will decrease Outcome: Progressing   

## 2022-01-11 ENCOUNTER — Other Ambulatory Visit: Payer: Self-pay | Admitting: *Deleted

## 2022-01-11 DIAGNOSIS — N179 Acute kidney failure, unspecified: Secondary | ICD-10-CM | POA: Diagnosis not present

## 2022-01-11 DIAGNOSIS — J9621 Acute and chronic respiratory failure with hypoxia: Secondary | ICD-10-CM | POA: Diagnosis not present

## 2022-01-11 NOTE — Patient Outreach (Signed)
Per [facility] eligible member resides in [facility].  Screened for post SNF care coordination services as a benefit of member's insurance plan.   ? ?Member's PCP at [facility] has [facility] care management services available.  ? ?[name] recently admitted to SNF on 01/09/22 after hospitalization. ? ?Facility site visit to [facility] facility. Met with Alison, SNF SW to make aware writer is following for transition plans/needs.  ? ?Will send notification to Upstream care management upon SNF discharge. ? ?Will continue to follow. ? ? ? , MSN, RN,BSN ?[facility] Post Acute Care Coordinator ?[phone] ( Business Mobile) ?[phone]  (Toll free office)  ? ? ? ? ? ? ? ?  ?

## 2022-01-21 ENCOUNTER — Other Ambulatory Visit: Payer: Self-pay | Admitting: *Deleted

## 2022-01-21 NOTE — Patient Outreach (Signed)
Per [place] eligible member currently resides in [facility].  Screening for care coordination services post SNF.  ? ?Member's PCP at [facility] [place] has Upstream care management services available.  ? ?Secure communication sent to facility SW to inquire about updated transition plans. ? ? ? ?Marthenia Rolling, MSN, RN,BSN ?[place] Coordinator ?[phone] [facility]) ?[phone]  (Toll free office)   ?

## 2022-01-22 ENCOUNTER — Ambulatory Visit: Payer: Medicare Other | Admitting: Internal Medicine

## 2022-01-22 ENCOUNTER — Encounter: Payer: Self-pay | Admitting: Internal Medicine

## 2022-01-22 VITALS — BP 140/56 | HR 69 | Ht 61.0 in | Wt 131.2 lb

## 2022-01-22 DIAGNOSIS — I480 Paroxysmal atrial fibrillation: Secondary | ICD-10-CM

## 2022-01-22 DIAGNOSIS — I5032 Chronic diastolic (congestive) heart failure: Secondary | ICD-10-CM

## 2022-01-22 DIAGNOSIS — I25709 Atherosclerosis of coronary artery bypass graft(s), unspecified, with unspecified angina pectoris: Secondary | ICD-10-CM

## 2022-01-22 DIAGNOSIS — N184 Chronic kidney disease, stage 4 (severe): Secondary | ICD-10-CM | POA: Diagnosis not present

## 2022-01-22 NOTE — Progress Notes (Signed)
? ? ?OFFICE NOTE ? ?Chief Complaint:  ?Follow-up hospitalization ? ?Primary Care Physician: ?Leeroy Cha, MD ? ?HPI:  ?[name] is a pleasant 81 year old female with history of coronary disease and CABG x 3 vessels in 2013, LIMA to LAD, SVG to OM and SVG to right coronary. She underwent an echocardiogram which demonstrates normal EF of 55% to 65%. However, there is stage 1 diastolic dysfunction and evidence for elevated mean left atrial filling pressure. There were mildly thickened calcified aortic valve leaflets but no stenosis and mild tricuspid regurgitation. At the time, I started her on Lasix 20 mg daily. A BMP was obtained that day which indicated mild elevation at 75.5, creatinine was 1.16. She has been taking the Lasix with improvement in her lower extremity swelling although shortness of breath seems to be about the same. Again, she notes it is only with marked exertion, not necessarily with normal activities. However, she does have ongoing fatigue and easy fatigability. This may be due to just poor exercise tolerance. She also reported that recently you started her on a new SLGT2 diabetic medicine and she said that she took several doses of that, which I believe was Invokana, and she became markedly dizzy with that. She has since stopped that medication. Again, overall the patient is fairly stable with a normal ejection fraction. She recently had bypass and is not having any anginal symptoms and therefore I do not suspect that this is an issue related to her bypass grafts.  ? ?I saw [name] today in the office for an acute visit regarding what is described as abdominal pain. She reports a numbness and pain sensation over the right upper quadrant. She's also noted some firmness and distention of her abdomen. This is recently presented to a GI doctor in [place] who thought she might have a gastric ulcer. He performed endoscopy which was a reportedly unrevealing. He also recommended she  is take MiraLAX for constipation. She says that recently she's been constipated but also has been having some small amount of loose stool. She did not want to take the MiraLAX given the loose stool. She also reports some of the discomfort extends across the mid epigastrium and into the lower chest. It is entirely atypical for cardiac chest pain. She reports that she had a colonoscopy in the past by Dr. Henrene Pastor about 5 years ago and a repeat colonoscopy was recommended around 2018. She also tells me that she recently had a right upper quadrant ultrasound performed at [facility] after presenting with abdominal pain which was negative for acute biliary disease. ? ?[name] returns today for follow-up. Overall she seems to be doing well from a cardiac standpoint. I understand she is switched primary care providers to [facility] primary care, however she is unclear of her primary care provider. She does have an upcoming appointment to see Jackelyn Knife neurology. Recently she's been having trouble with forgetfulness and it seems to be getting worse. She tells me she lost her cell phone at home and has not been able to locate it. She denies any chest pain or shortness of breath and in fact her flank back and abdominal pain which was evaluated extensively has now gone away. ? ?I had the pleasure of seeing [name] back today in follow-up. It's been 6 months and she'll last appointment. Her main concern today is leg weakness. She reports she has some back pain and associated weakness in her legs that give out from time  to time. She recently had memory testing indicating possible dementia as well as likely depression. It was recommended that she go on therapy however she declined medications. She denies any chest pain or worsening shortness of breath. ? ?07/01/2016 ? ?[name] returns today for follow-up. She was recently hospitalized for acute diastolic heart failure. She was treated with diuretics and  adjustments are made to her blood pressure medications. She had a repeat echocardiogram which was essentially stable showed normal LV EF and diastolic dysfunction. After discharge she underwent nuclear stress testing which was negative for ischemia. She saw Ignacia Bayley, NP, who made some changes to her blood pressure medications including placing her on clonidine. Since taking this medicine she has had some of the typical symptoms of fatigue and dry mouth as well as constipation. She was alert initially taking clonidine 2 mg twice daily. She then decreased it to one half tablet or 1 mg twice daily but blood pressure remains high. I checked her blood pressure manually today 160/60. She reports fatigue and low energy. ? ?09/22/2016 ? ?[name] returns today for hospital follow-up. 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 She had a [employer] surety MRI safe pacemaker placed. She was also started on amiodarone and underwent cardioversion. She seems to be maintaining sinus rhythm. She reports improvement in her energy. She recently saw Ignacia Bayley, NP, in follow-up who reported she was doing well although noted her blood pressure was more elevated. She was placed on doxazosin 4 mg daily and had initial improvement in blood pressure however blood pressure has since gone up. Today was 186/62. Unfortunately, she's been intolerant of a number of blood pressure medications in the past. ? ?12/23/2016 ? ?[name] was seen in follow-up today. She saw Dr. candidates in January who noted that she reported being shaky on amiodarone and he decreased the dose down to 100 mg daily. She remains in an AV paced rhythm but there has been recurrent underlying atrial flutter. She is also had some fatigue. The pressure is elevated today 156/70. I reviewed home monitoring indicates blood pressures continue to be elevated. She seems to be okay with these numbers but she  is at increased risk for events. Unfortunate she's had intolerance to number of medications. ? ?04/22/2017 ? ?Mrs. Ephraim returns today for follow-up. She recently saw Almyra Deforest, PA-C, who noted that she had had some weight gain and lower extremity edema. He discontinued her chlorthalidone and put her on low-dose Lasix. She's had improvement in her swallowing with this and some weight loss. She then followed up with Dr. Moshe Cipro at [place] [facility]. Her creatinine was noted to be 2.7 and she is undergoing a renal ultrasound and further workup for this. She was also noted to be anemic with a low iron saturation. She's been receiving some IV iron. She seems to be maintaining sinus rhythm on low-dose amiodarone. Recently she saw her endocrinologist who had increased her Synthroid. Her last TSH was between 7 and 8. She is due for repeat check of that. She has done well with the placement of a pacemaker in the fall. She has remote checks to follow that. ? ?08/25/2017 ? ?Mrs. Berland was seen today in follow-up.  Recently she had about 12 pound weight gain and saw her nephrologist.  She was started back on Lasix and was noted to have an elevated creatinine now back up to 2.14.  In addition her blood pressure was running low, specifically  her diastolic blood pressure and her amlodipine was decreased to 5 mg daily.  She seems to be maintaining sinus rhythm and is on low-dose amiodarone.  She is in a paced rhythm today and recent remote check showed no real evidence of atrial fibrillation.  She was on Xarelto however stopped that on her own recently because of fatigue and weakness.  She was also noted to be anemic and has been started on iron.  This begs a question of whether she may be having microscopic bleeding or whether her anemia was related to chronic kidney disease.  Nevertheless, she has an appropriately elevated chads vascular score and should be anticoagulated on Xarelto. ? ?10/26/2017 ? ?Mrs. Edelstein returns today  for follow-up.  She again is gained about 5 or 6 pounds.  Apparently she had seen her nephrologist and was started back on Lasix however her creatinine is rising.  I repeated labs on October 10, 2017 w

## 2022-01-22 NOTE — Patient Instructions (Signed)
Medication Instructions:  ?Your physician has requested that you have a TEE. During a TEE, sound waves are used to create images of your heart. It provides your doctor with information about the size and shape of your heart and how well your heart?s chambers and valves are working. In this test, a transducer is attached to the end of a flexible tube that?s guided down your throat and into your esophagus (the tube leading from you mouth to your stomach) to get a more detailed image of your heart. You are not awake for the procedure. Please see the instruction sheet given to you today. For further information please visit [email or web address]. ? ? ?*If you need a refill on your cardiac medications before your next appointment, please call your pharmacy* ? ? ?Follow-Up: ?At [facility], you and your health needs are our priority.  As part of our continuing mission to provide you with exceptional heart care, we have created designated Provider Care Teams.  These Care Teams include your primary Cardiologist (physician) and Advanced Practice Providers (APPs -  Physician Assistants and Nurse Practitioners) who all work together to provide you with the care you need, when you need it. ? ?We recommend signing up for the patient portal called "MyChart".  Sign up information is provided on this After Visit Summary.  MyChart is used to connect with patients for Virtual Visits (Telemedicine).  Patients are able to view lab/test results, encounter notes, upcoming appointments, etc.  Non-urgent messages can be sent to your provider as well.   ?To learn more about what you can do with MyChart, go to [email or web address].   ? ?Your next appointment:   ?In June as scheduled ? ? ? ?

## 2022-01-23 ENCOUNTER — Other Ambulatory Visit: Payer: Self-pay | Admitting: Internal Medicine

## 2022-01-23 DIAGNOSIS — N179 Acute kidney failure, unspecified: Secondary | ICD-10-CM | POA: Diagnosis not present

## 2022-01-23 DIAGNOSIS — J9621 Acute and chronic respiratory failure with hypoxia: Secondary | ICD-10-CM | POA: Diagnosis not present

## 2022-01-25 ENCOUNTER — Other Ambulatory Visit: Payer: Self-pay | Admitting: *Deleted

## 2022-01-25 NOTE — Patient Outreach (Signed)
[place] Coordinator follow up. Verified in [facility] Mrs. Raineri transitioned from [facility] on 01/23/22 to home with home health services.  ? ?Member's PCP at [facility]  has Upstream care management services available.  ? ?Notification of transition date sent to Upstream team.  ? ? ?Marthenia Rolling, MSN, RN,BSN ?[place] Coordinator ?[phone] [facility]) ?[phone]  (Toll free office)   ?

## 2022-02-01 DIAGNOSIS — Z950 Presence of cardiac pacemaker: Secondary | ICD-10-CM | POA: Diagnosis not present

## 2022-02-01 DIAGNOSIS — Z7982 Long term (current) use of aspirin: Secondary | ICD-10-CM | POA: Diagnosis not present

## 2022-02-01 DIAGNOSIS — Z95818 Presence of other cardiac implants and grafts: Secondary | ICD-10-CM | POA: Diagnosis not present

## 2022-02-01 DIAGNOSIS — M170 Bilateral primary osteoarthritis of knee: Secondary | ICD-10-CM | POA: Diagnosis not present

## 2022-02-01 DIAGNOSIS — Z794 Long term (current) use of insulin: Secondary | ICD-10-CM | POA: Diagnosis not present

## 2022-02-01 DIAGNOSIS — I495 Sick sinus syndrome: Secondary | ICD-10-CM | POA: Diagnosis not present

## 2022-02-01 DIAGNOSIS — N184 Chronic kidney disease, stage 4 (severe): Secondary | ICD-10-CM | POA: Diagnosis not present

## 2022-02-01 DIAGNOSIS — I517 Cardiomegaly: Secondary | ICD-10-CM | POA: Diagnosis not present

## 2022-02-01 DIAGNOSIS — E1122 Type 2 diabetes mellitus with diabetic chronic kidney disease: Secondary | ICD-10-CM | POA: Diagnosis not present

## 2022-02-01 DIAGNOSIS — J9601 Acute respiratory failure with hypoxia: Secondary | ICD-10-CM | POA: Diagnosis not present

## 2022-02-01 DIAGNOSIS — K5790 Diverticulosis of intestine, part unspecified, without perforation or abscess without bleeding: Secondary | ICD-10-CM | POA: Diagnosis not present

## 2022-02-01 DIAGNOSIS — I5033 Acute on chronic diastolic (congestive) heart failure: Secondary | ICD-10-CM | POA: Diagnosis not present

## 2022-02-01 DIAGNOSIS — I2510 Atherosclerotic heart disease of native coronary artery without angina pectoris: Secondary | ICD-10-CM | POA: Diagnosis not present

## 2022-02-01 DIAGNOSIS — D638 Anemia in other chronic diseases classified elsewhere: Secondary | ICD-10-CM | POA: Diagnosis not present

## 2022-02-01 DIAGNOSIS — I130 Hypertensive heart and chronic kidney disease with heart failure and stage 1 through stage 4 chronic kidney disease, or unspecified chronic kidney disease: Secondary | ICD-10-CM | POA: Diagnosis not present

## 2022-02-01 DIAGNOSIS — E039 Hypothyroidism, unspecified: Secondary | ICD-10-CM | POA: Diagnosis not present

## 2022-02-01 DIAGNOSIS — I480 Paroxysmal atrial fibrillation: Secondary | ICD-10-CM | POA: Diagnosis not present

## 2022-02-01 DIAGNOSIS — Z951 Presence of aortocoronary bypass graft: Secondary | ICD-10-CM | POA: Diagnosis not present

## 2022-02-01 DIAGNOSIS — I352 Nonrheumatic aortic (valve) stenosis with insufficiency: Secondary | ICD-10-CM | POA: Diagnosis not present

## 2022-02-01 DIAGNOSIS — E785 Hyperlipidemia, unspecified: Secondary | ICD-10-CM | POA: Diagnosis not present

## 2022-02-01 DIAGNOSIS — E1169 Type 2 diabetes mellitus with other specified complication: Secondary | ICD-10-CM | POA: Diagnosis not present

## 2022-02-02 DIAGNOSIS — Z794 Long term (current) use of insulin: Secondary | ICD-10-CM | POA: Diagnosis not present

## 2022-02-02 DIAGNOSIS — I495 Sick sinus syndrome: Secondary | ICD-10-CM | POA: Diagnosis not present

## 2022-02-02 DIAGNOSIS — Z950 Presence of cardiac pacemaker: Secondary | ICD-10-CM | POA: Diagnosis not present

## 2022-02-02 DIAGNOSIS — N184 Chronic kidney disease, stage 4 (severe): Secondary | ICD-10-CM | POA: Diagnosis not present

## 2022-02-02 DIAGNOSIS — K5790 Diverticulosis of intestine, part unspecified, without perforation or abscess without bleeding: Secondary | ICD-10-CM | POA: Diagnosis not present

## 2022-02-02 DIAGNOSIS — E785 Hyperlipidemia, unspecified: Secondary | ICD-10-CM | POA: Diagnosis not present

## 2022-02-02 DIAGNOSIS — Z7982 Long term (current) use of aspirin: Secondary | ICD-10-CM | POA: Diagnosis not present

## 2022-02-02 DIAGNOSIS — I352 Nonrheumatic aortic (valve) stenosis with insufficiency: Secondary | ICD-10-CM | POA: Diagnosis not present

## 2022-02-02 DIAGNOSIS — D638 Anemia in other chronic diseases classified elsewhere: Secondary | ICD-10-CM | POA: Diagnosis not present

## 2022-02-02 DIAGNOSIS — I2510 Atherosclerotic heart disease of native coronary artery without angina pectoris: Secondary | ICD-10-CM | POA: Diagnosis not present

## 2022-02-02 DIAGNOSIS — I5033 Acute on chronic diastolic (congestive) heart failure: Secondary | ICD-10-CM | POA: Diagnosis not present

## 2022-02-02 DIAGNOSIS — Z95818 Presence of other cardiac implants and grafts: Secondary | ICD-10-CM | POA: Diagnosis not present

## 2022-02-02 DIAGNOSIS — Z951 Presence of aortocoronary bypass graft: Secondary | ICD-10-CM | POA: Diagnosis not present

## 2022-02-02 DIAGNOSIS — E1122 Type 2 diabetes mellitus with diabetic chronic kidney disease: Secondary | ICD-10-CM | POA: Diagnosis not present

## 2022-02-02 DIAGNOSIS — J9601 Acute respiratory failure with hypoxia: Secondary | ICD-10-CM | POA: Diagnosis not present

## 2022-02-02 DIAGNOSIS — E039 Hypothyroidism, unspecified: Secondary | ICD-10-CM | POA: Diagnosis not present

## 2022-02-02 DIAGNOSIS — E1169 Type 2 diabetes mellitus with other specified complication: Secondary | ICD-10-CM | POA: Diagnosis not present

## 2022-02-02 DIAGNOSIS — M170 Bilateral primary osteoarthritis of knee: Secondary | ICD-10-CM | POA: Diagnosis not present

## 2022-02-02 DIAGNOSIS — I480 Paroxysmal atrial fibrillation: Secondary | ICD-10-CM | POA: Diagnosis not present

## 2022-02-02 DIAGNOSIS — I517 Cardiomegaly: Secondary | ICD-10-CM | POA: Diagnosis not present

## 2022-02-02 DIAGNOSIS — I130 Hypertensive heart and chronic kidney disease with heart failure and stage 1 through stage 4 chronic kidney disease, or unspecified chronic kidney disease: Secondary | ICD-10-CM | POA: Diagnosis not present

## 2022-02-04 DIAGNOSIS — I352 Nonrheumatic aortic (valve) stenosis with insufficiency: Secondary | ICD-10-CM | POA: Diagnosis not present

## 2022-02-04 DIAGNOSIS — I495 Sick sinus syndrome: Secondary | ICD-10-CM | POA: Diagnosis not present

## 2022-02-04 DIAGNOSIS — E039 Hypothyroidism, unspecified: Secondary | ICD-10-CM | POA: Diagnosis not present

## 2022-02-04 DIAGNOSIS — I2510 Atherosclerotic heart disease of native coronary artery without angina pectoris: Secondary | ICD-10-CM | POA: Diagnosis not present

## 2022-02-04 DIAGNOSIS — M170 Bilateral primary osteoarthritis of knee: Secondary | ICD-10-CM | POA: Diagnosis not present

## 2022-02-04 DIAGNOSIS — Z95818 Presence of other cardiac implants and grafts: Secondary | ICD-10-CM | POA: Diagnosis not present

## 2022-02-04 DIAGNOSIS — I480 Paroxysmal atrial fibrillation: Secondary | ICD-10-CM | POA: Diagnosis not present

## 2022-02-04 DIAGNOSIS — Z950 Presence of cardiac pacemaker: Secondary | ICD-10-CM | POA: Diagnosis not present

## 2022-02-04 DIAGNOSIS — Z7982 Long term (current) use of aspirin: Secondary | ICD-10-CM | POA: Diagnosis not present

## 2022-02-04 DIAGNOSIS — Z794 Long term (current) use of insulin: Secondary | ICD-10-CM | POA: Diagnosis not present

## 2022-02-04 DIAGNOSIS — D638 Anemia in other chronic diseases classified elsewhere: Secondary | ICD-10-CM | POA: Diagnosis not present

## 2022-02-04 DIAGNOSIS — I130 Hypertensive heart and chronic kidney disease with heart failure and stage 1 through stage 4 chronic kidney disease, or unspecified chronic kidney disease: Secondary | ICD-10-CM | POA: Diagnosis not present

## 2022-02-04 DIAGNOSIS — N184 Chronic kidney disease, stage 4 (severe): Secondary | ICD-10-CM | POA: Diagnosis not present

## 2022-02-04 DIAGNOSIS — I517 Cardiomegaly: Secondary | ICD-10-CM | POA: Diagnosis not present

## 2022-02-04 DIAGNOSIS — E785 Hyperlipidemia, unspecified: Secondary | ICD-10-CM | POA: Diagnosis not present

## 2022-02-04 DIAGNOSIS — K5790 Diverticulosis of intestine, part unspecified, without perforation or abscess without bleeding: Secondary | ICD-10-CM | POA: Diagnosis not present

## 2022-02-04 DIAGNOSIS — Z951 Presence of aortocoronary bypass graft: Secondary | ICD-10-CM | POA: Diagnosis not present

## 2022-02-04 DIAGNOSIS — J9601 Acute respiratory failure with hypoxia: Secondary | ICD-10-CM | POA: Diagnosis not present

## 2022-02-04 DIAGNOSIS — I5033 Acute on chronic diastolic (congestive) heart failure: Secondary | ICD-10-CM | POA: Diagnosis not present

## 2022-02-04 DIAGNOSIS — E1122 Type 2 diabetes mellitus with diabetic chronic kidney disease: Secondary | ICD-10-CM | POA: Diagnosis not present

## 2022-02-04 DIAGNOSIS — E1169 Type 2 diabetes mellitus with other specified complication: Secondary | ICD-10-CM | POA: Diagnosis not present

## 2022-02-05 ENCOUNTER — Other Ambulatory Visit: Payer: Self-pay | Admitting: Internal Medicine

## 2022-02-08 DIAGNOSIS — I352 Nonrheumatic aortic (valve) stenosis with insufficiency: Secondary | ICD-10-CM | POA: Diagnosis not present

## 2022-02-08 DIAGNOSIS — I2510 Atherosclerotic heart disease of native coronary artery without angina pectoris: Secondary | ICD-10-CM | POA: Diagnosis not present

## 2022-02-08 DIAGNOSIS — J9601 Acute respiratory failure with hypoxia: Secondary | ICD-10-CM | POA: Diagnosis not present

## 2022-02-08 DIAGNOSIS — I4892 Unspecified atrial flutter: Secondary | ICD-10-CM | POA: Diagnosis not present

## 2022-02-08 DIAGNOSIS — Z951 Presence of aortocoronary bypass graft: Secondary | ICD-10-CM | POA: Diagnosis not present

## 2022-02-08 DIAGNOSIS — I480 Paroxysmal atrial fibrillation: Secondary | ICD-10-CM | POA: Diagnosis not present

## 2022-02-08 DIAGNOSIS — E1169 Type 2 diabetes mellitus with other specified complication: Secondary | ICD-10-CM | POA: Diagnosis not present

## 2022-02-08 DIAGNOSIS — Z950 Presence of cardiac pacemaker: Secondary | ICD-10-CM | POA: Diagnosis not present

## 2022-02-08 DIAGNOSIS — M109 Gout, unspecified: Secondary | ICD-10-CM | POA: Diagnosis not present

## 2022-02-08 DIAGNOSIS — Z95818 Presence of other cardiac implants and grafts: Secondary | ICD-10-CM | POA: Diagnosis not present

## 2022-02-08 DIAGNOSIS — K5790 Diverticulosis of intestine, part unspecified, without perforation or abscess without bleeding: Secondary | ICD-10-CM | POA: Diagnosis not present

## 2022-02-08 DIAGNOSIS — I10 Essential (primary) hypertension: Secondary | ICD-10-CM | POA: Diagnosis not present

## 2022-02-08 DIAGNOSIS — I517 Cardiomegaly: Secondary | ICD-10-CM | POA: Diagnosis not present

## 2022-02-08 DIAGNOSIS — I5032 Chronic diastolic (congestive) heart failure: Secondary | ICD-10-CM | POA: Diagnosis not present

## 2022-02-08 DIAGNOSIS — I5033 Acute on chronic diastolic (congestive) heart failure: Secondary | ICD-10-CM | POA: Diagnosis not present

## 2022-02-08 DIAGNOSIS — D638 Anemia in other chronic diseases classified elsewhere: Secondary | ICD-10-CM | POA: Diagnosis not present

## 2022-02-08 DIAGNOSIS — Z7982 Long term (current) use of aspirin: Secondary | ICD-10-CM | POA: Diagnosis not present

## 2022-02-08 DIAGNOSIS — N184 Chronic kidney disease, stage 4 (severe): Secondary | ICD-10-CM | POA: Diagnosis not present

## 2022-02-08 DIAGNOSIS — E039 Hypothyroidism, unspecified: Secondary | ICD-10-CM | POA: Diagnosis not present

## 2022-02-08 DIAGNOSIS — E1122 Type 2 diabetes mellitus with diabetic chronic kidney disease: Secondary | ICD-10-CM | POA: Diagnosis not present

## 2022-02-08 DIAGNOSIS — I130 Hypertensive heart and chronic kidney disease with heart failure and stage 1 through stage 4 chronic kidney disease, or unspecified chronic kidney disease: Secondary | ICD-10-CM | POA: Diagnosis not present

## 2022-02-08 DIAGNOSIS — I495 Sick sinus syndrome: Secondary | ICD-10-CM | POA: Diagnosis not present

## 2022-02-08 DIAGNOSIS — Z8709 Personal history of other diseases of the respiratory system: Secondary | ICD-10-CM | POA: Diagnosis not present

## 2022-02-08 DIAGNOSIS — M170 Bilateral primary osteoarthritis of knee: Secondary | ICD-10-CM | POA: Diagnosis not present

## 2022-02-08 DIAGNOSIS — Z794 Long term (current) use of insulin: Secondary | ICD-10-CM | POA: Diagnosis not present

## 2022-02-08 DIAGNOSIS — E785 Hyperlipidemia, unspecified: Secondary | ICD-10-CM | POA: Diagnosis not present

## 2022-02-10 ENCOUNTER — Other Ambulatory Visit: Payer: Self-pay

## 2022-02-10 ENCOUNTER — Emergency Department (HOSPITAL_COMMUNITY): Payer: Medicare Other

## 2022-02-10 ENCOUNTER — Inpatient Hospital Stay (HOSPITAL_COMMUNITY): Payer: Medicare Other

## 2022-02-10 ENCOUNTER — Inpatient Hospital Stay (HOSPITAL_COMMUNITY)
Admission: EM | Admit: 2022-02-10 | Discharge: 2022-02-12 | DRG: 291 | Disposition: A | Discharge status: Home Health | Payer: Medicare Other | Attending: Internal Medicine | Admitting: Internal Medicine

## 2022-02-10 ENCOUNTER — Encounter (HOSPITAL_COMMUNITY): Payer: Self-pay | Admitting: Emergency Medicine

## 2022-02-10 DIAGNOSIS — Z8249 Family history of ischemic heart disease and other diseases of the circulatory system: Secondary | ICD-10-CM

## 2022-02-10 DIAGNOSIS — I2510 Atherosclerotic heart disease of native coronary artery without angina pectoris: Secondary | ICD-10-CM | POA: Diagnosis present

## 2022-02-10 DIAGNOSIS — M19042 Primary osteoarthritis, left hand: Secondary | ICD-10-CM | POA: Diagnosis present

## 2022-02-10 DIAGNOSIS — Z803 Family history of malignant neoplasm of breast: Secondary | ICD-10-CM

## 2022-02-10 DIAGNOSIS — I495 Sick sinus syndrome: Secondary | ICD-10-CM | POA: Diagnosis present

## 2022-02-10 DIAGNOSIS — E119 Type 2 diabetes mellitus without complications: Secondary | ICD-10-CM

## 2022-02-10 DIAGNOSIS — J9621 Acute and chronic respiratory failure with hypoxia: Secondary | ICD-10-CM | POA: Diagnosis present

## 2022-02-10 DIAGNOSIS — G3184 Mild cognitive impairment, so stated: Secondary | ICD-10-CM | POA: Diagnosis not present

## 2022-02-10 DIAGNOSIS — Z20822 Contact with and (suspected) exposure to covid-19: Secondary | ICD-10-CM | POA: Diagnosis not present

## 2022-02-10 DIAGNOSIS — I160 Hypertensive urgency: Secondary | ICD-10-CM | POA: Diagnosis present

## 2022-02-10 DIAGNOSIS — Z66 Do not resuscitate: Secondary | ICD-10-CM | POA: Diagnosis present

## 2022-02-10 DIAGNOSIS — K219 Gastro-esophageal reflux disease without esophagitis: Secondary | ICD-10-CM | POA: Diagnosis not present

## 2022-02-10 DIAGNOSIS — N184 Chronic kidney disease, stage 4 (severe): Secondary | ICD-10-CM | POA: Diagnosis present

## 2022-02-10 DIAGNOSIS — I130 Hypertensive heart and chronic kidney disease with heart failure and stage 1 through stage 4 chronic kidney disease, or unspecified chronic kidney disease: Principal | ICD-10-CM | POA: Diagnosis present

## 2022-02-10 DIAGNOSIS — Z951 Presence of aortocoronary bypass graft: Secondary | ICD-10-CM | POA: Diagnosis not present

## 2022-02-10 DIAGNOSIS — I480 Paroxysmal atrial fibrillation: Secondary | ICD-10-CM | POA: Diagnosis present

## 2022-02-10 DIAGNOSIS — E1122 Type 2 diabetes mellitus with diabetic chronic kidney disease: Secondary | ICD-10-CM | POA: Diagnosis present

## 2022-02-10 DIAGNOSIS — Z7982 Long term (current) use of aspirin: Secondary | ICD-10-CM

## 2022-02-10 DIAGNOSIS — R0600 Dyspnea, unspecified: Secondary | ICD-10-CM | POA: Diagnosis not present

## 2022-02-10 DIAGNOSIS — Z794 Long term (current) use of insulin: Secondary | ICD-10-CM

## 2022-02-10 DIAGNOSIS — R059 Cough, unspecified: Secondary | ICD-10-CM | POA: Diagnosis not present

## 2022-02-10 DIAGNOSIS — Z833 Family history of diabetes mellitus: Secondary | ICD-10-CM

## 2022-02-10 DIAGNOSIS — E039 Hypothyroidism, unspecified: Secondary | ICD-10-CM | POA: Diagnosis present

## 2022-02-10 DIAGNOSIS — Z743 Need for continuous supervision: Secondary | ICD-10-CM | POA: Diagnosis not present

## 2022-02-10 DIAGNOSIS — I252 Old myocardial infarction: Secondary | ICD-10-CM

## 2022-02-10 DIAGNOSIS — Z79899 Other long term (current) drug therapy: Secondary | ICD-10-CM | POA: Diagnosis not present

## 2022-02-10 DIAGNOSIS — M47816 Spondylosis without myelopathy or radiculopathy, lumbar region: Secondary | ICD-10-CM | POA: Diagnosis present

## 2022-02-10 DIAGNOSIS — Z801 Family history of malignant neoplasm of trachea, bronchus and lung: Secondary | ICD-10-CM

## 2022-02-10 DIAGNOSIS — I5032 Chronic diastolic (congestive) heart failure: Secondary | ICD-10-CM | POA: Diagnosis present

## 2022-02-10 DIAGNOSIS — Z85828 Personal history of other malignant neoplasm of skin: Secondary | ICD-10-CM

## 2022-02-10 DIAGNOSIS — J9601 Acute respiratory failure with hypoxia: Secondary | ICD-10-CM | POA: Diagnosis present

## 2022-02-10 DIAGNOSIS — R112 Nausea with vomiting, unspecified: Secondary | ICD-10-CM | POA: Diagnosis not present

## 2022-02-10 DIAGNOSIS — E785 Hyperlipidemia, unspecified: Secondary | ICD-10-CM | POA: Diagnosis not present

## 2022-02-10 DIAGNOSIS — Z950 Presence of cardiac pacemaker: Secondary | ICD-10-CM | POA: Diagnosis not present

## 2022-02-10 DIAGNOSIS — R0602 Shortness of breath: Secondary | ICD-10-CM | POA: Diagnosis not present

## 2022-02-10 DIAGNOSIS — I4892 Unspecified atrial flutter: Secondary | ICD-10-CM | POA: Diagnosis present

## 2022-02-10 DIAGNOSIS — M479 Spondylosis, unspecified: Secondary | ICD-10-CM | POA: Diagnosis present

## 2022-02-10 DIAGNOSIS — Z79890 Hormone replacement therapy (postmenopausal): Secondary | ICD-10-CM

## 2022-02-10 DIAGNOSIS — E890 Postprocedural hypothyroidism: Secondary | ICD-10-CM | POA: Diagnosis present

## 2022-02-10 DIAGNOSIS — M19041 Primary osteoarthritis, right hand: Secondary | ICD-10-CM | POA: Diagnosis present

## 2022-02-10 DIAGNOSIS — R7989 Other specified abnormal findings of blood chemistry: Secondary | ICD-10-CM | POA: Diagnosis present

## 2022-02-10 DIAGNOSIS — I5033 Acute on chronic diastolic (congestive) heart failure: Secondary | ICD-10-CM | POA: Diagnosis not present

## 2022-02-10 DIAGNOSIS — I25810 Atherosclerosis of coronary artery bypass graft(s) without angina pectoris: Secondary | ICD-10-CM | POA: Diagnosis present

## 2022-02-10 DIAGNOSIS — Z888 Allergy status to other drugs, medicaments and biological substances status: Secondary | ICD-10-CM

## 2022-02-10 DIAGNOSIS — Z882 Allergy status to sulfonamides status: Secondary | ICD-10-CM

## 2022-02-10 DIAGNOSIS — R778 Other specified abnormalities of plasma proteins: Secondary | ICD-10-CM | POA: Diagnosis present

## 2022-02-10 DIAGNOSIS — I509 Heart failure, unspecified: Secondary | ICD-10-CM | POA: Insufficient documentation

## 2022-02-10 DIAGNOSIS — M170 Bilateral primary osteoarthritis of knee: Secondary | ICD-10-CM | POA: Diagnosis present

## 2022-02-10 DIAGNOSIS — R109 Unspecified abdominal pain: Secondary | ICD-10-CM | POA: Diagnosis not present

## 2022-02-10 DIAGNOSIS — J810 Acute pulmonary edema: Principal | ICD-10-CM

## 2022-02-10 DIAGNOSIS — K5730 Diverticulosis of large intestine without perforation or abscess without bleeding: Secondary | ICD-10-CM | POA: Diagnosis present

## 2022-02-10 DIAGNOSIS — R6889 Other general symptoms and signs: Secondary | ICD-10-CM | POA: Diagnosis not present

## 2022-02-10 DIAGNOSIS — Z823 Family history of stroke: Secondary | ICD-10-CM

## 2022-02-10 DIAGNOSIS — Z8639 Personal history of other endocrine, nutritional and metabolic disease: Secondary | ICD-10-CM

## 2022-02-10 LAB — BASIC METABOLIC PANEL
Anion gap: 7 (ref 5–15)
BUN: 33 mg/dL — ABNORMAL HIGH (ref 8–23)
CO2: 26 mmol/L (ref 22–32)
Calcium: 8.9 mg/dL (ref 8.9–10.3)
Chloride: 109 mmol/L (ref 98–111)
Creatinine, Ser: 1.83 mg/dL — ABNORMAL HIGH (ref 0.44–1.00)
GFR, Estimated: 27 mL/min — ABNORMAL LOW (ref >60)
Glucose, Bld: 187 mg/dL — ABNORMAL HIGH (ref 70–99)
Potassium: 5 mmol/L (ref 3.5–5.1)
Sodium: 142 mmol/L (ref 135–145)

## 2022-02-10 LAB — BRAIN NATRIURETIC PEPTIDE: B Natriuretic Peptide: 2214.2 pg/mL — ABNORMAL HIGH (ref 0.0–100.0)

## 2022-02-10 LAB — MAGNESIUM: Magnesium: 2.3 mg/dL (ref 1.7–2.4)

## 2022-02-10 LAB — HEPATIC FUNCTION PANEL
ALT: 17 U/L (ref 0–44)
AST: 34 U/L (ref 15–41)
Albumin: 3.6 g/dL (ref 3.5–5.0)
Alkaline Phosphatase: 75 U/L (ref 38–126)
Bilirubin, Direct: 0.2 mg/dL (ref 0.0–0.2)
Indirect Bilirubin: 0.4 mg/dL (ref 0.3–0.9)
Total Bilirubin: 0.6 mg/dL (ref 0.3–1.2)
Total Protein: 6.6 g/dL (ref 6.5–8.1)

## 2022-02-10 LAB — CBC
HCT: 33.9 % — ABNORMAL LOW (ref 36.0–46.0)
Hemoglobin: 11 g/dL — ABNORMAL LOW (ref 12.0–15.0)
MCH: 33.1 pg (ref 26.0–34.0)
MCHC: 32.4 g/dL (ref 30.0–36.0)
MCV: 102.1 fL — ABNORMAL HIGH (ref 80.0–100.0)
Platelets: 177 10*3/uL (ref 150–400)
RBC: 3.32 MIL/uL — ABNORMAL LOW (ref 3.87–5.11)
RDW: 14 % (ref 11.5–15.5)
WBC: 7.8 10*3/uL (ref 4.0–10.5)
nRBC: 0 % (ref 0.0–0.2)

## 2022-02-10 LAB — RESP PANEL BY RT-PCR (FLU A&B, COVID) ARPGX2
Influenza A by PCR: NEGATIVE
Influenza B by PCR: NEGATIVE
SARS Coronavirus 2 by RT PCR: NEGATIVE

## 2022-02-10 LAB — GLUCOSE, CAPILLARY
Glucose-Capillary: 106 mg/dL — ABNORMAL HIGH (ref 70–99)
Glucose-Capillary: 115 mg/dL — ABNORMAL HIGH (ref 70–99)

## 2022-02-10 LAB — LIPASE, BLOOD: Lipase: 56 U/L — ABNORMAL HIGH (ref 11–51)

## 2022-02-10 LAB — TROPONIN I (HIGH SENSITIVITY)
Troponin I (High Sensitivity): 31 ng/L — ABNORMAL HIGH (ref <18)
Troponin I (High Sensitivity): 36 ng/L — ABNORMAL HIGH (ref <18)

## 2022-02-10 MED ORDER — INSULIN ASPART 100 UNIT/ML IJ SOLN
0.0000 [IU] | Freq: Three times a day (TID) | INTRAMUSCULAR | Status: DC
  Administered 2022-02-11: 1 [IU] via SUBCUTANEOUS

## 2022-02-10 MED ORDER — ALLOPURINOL 100 MG PO TABS
200.0000 mg | ORAL_TABLET | Freq: Every day | ORAL | Status: DC
  Administered 2022-02-11 – 2022-02-12 (×2): 200 mg via ORAL
  Filled 2022-02-10 (×2): qty 2

## 2022-02-10 MED ORDER — ISOSORBIDE MONONITRATE ER 30 MG PO TB24
30.0000 mg | ORAL_TABLET | Freq: Every morning | ORAL | Status: DC
  Administered 2022-02-12: 30 mg via ORAL
  Filled 2022-02-10: qty 1

## 2022-02-10 MED ORDER — ALBUTEROL SULFATE (2.5 MG/3ML) 0.083% IN NEBU: 2.5000 mg | INHALATION_SOLUTION | Freq: Four times a day (QID) | RESPIRATORY_TRACT | Status: DC | PRN

## 2022-02-10 MED ORDER — FUROSEMIDE 10 MG/ML IJ SOLN
40.0000 mg | Freq: Two times a day (BID) | INTRAMUSCULAR | Status: DC
  Administered 2022-02-10 – 2022-02-12 (×4): 40 mg via INTRAVENOUS
  Filled 2022-02-10 (×4): qty 4

## 2022-02-10 MED ORDER — GABAPENTIN 300 MG PO CAPS
300.0000 mg | ORAL_CAPSULE | Freq: Every day | ORAL | Status: DC
  Administered 2022-02-10 – 2022-02-11 (×2): 300 mg via ORAL
  Filled 2022-02-10 (×2): qty 1

## 2022-02-10 MED ORDER — INSULIN GLARGINE-YFGN 100 UNIT/ML ~~LOC~~ SOLN
20.0000 [IU] | Freq: Every day | SUBCUTANEOUS | Status: DC
  Administered 2022-02-10 – 2022-02-12 (×2): 20 [IU] via SUBCUTANEOUS
  Filled 2022-02-10 (×3): qty 0.2

## 2022-02-10 MED ORDER — SENNOSIDES-DOCUSATE SODIUM 8.6-50 MG PO TABS
1.0000 | ORAL_TABLET | Freq: Two times a day (BID) | ORAL | Status: DC
  Administered 2022-02-10 – 2022-02-12 (×4): 1 via ORAL
  Filled 2022-02-10 (×4): qty 1

## 2022-02-10 MED ORDER — MECLIZINE HCL 25 MG PO TABS: 25.0000 mg | ORAL_TABLET | Freq: Every day | ORAL | Status: DC | PRN

## 2022-02-10 MED ORDER — LEVOTHYROXINE SODIUM 100 MCG PO TABS
100.0000 ug | ORAL_TABLET | Freq: Every day | ORAL | Status: DC
  Administered 2022-02-11 – 2022-02-12 (×2): 100 ug via ORAL
  Filled 2022-02-10 (×2): qty 1

## 2022-02-10 MED ORDER — SODIUM CHLORIDE 0.9% FLUSH
3.0000 mL | Freq: Two times a day (BID) | INTRAVENOUS | Status: DC
  Administered 2022-02-10 – 2022-02-12 (×5): 3 mL via INTRAVENOUS

## 2022-02-10 MED ORDER — ENOXAPARIN SODIUM 40 MG/0.4ML IJ SOSY
40.0000 mg | PREFILLED_SYRINGE | INTRAMUSCULAR | Status: DC
  Administered 2022-02-10: 40 mg via SUBCUTANEOUS
  Filled 2022-02-10: qty 0.4

## 2022-02-10 MED ORDER — ONDANSETRON HCL 4 MG/2ML IJ SOLN
4.0000 mg | Freq: Four times a day (QID) | INTRAMUSCULAR | Status: DC | PRN
  Administered 2022-02-10: 4 mg via INTRAVENOUS
  Filled 2022-02-10: qty 2

## 2022-02-10 MED ORDER — CARVEDILOL 12.5 MG PO TABS
12.5000 mg | ORAL_TABLET | Freq: Two times a day (BID) | ORAL | Status: DC
  Administered 2022-02-10: 12.5 mg via ORAL
  Filled 2022-02-10: qty 1

## 2022-02-10 MED ORDER — ENOXAPARIN SODIUM 30 MG/0.3ML IJ SOSY
30.0000 mg | PREFILLED_SYRINGE | INTRAMUSCULAR | Status: DC
Start: 2022-02-11 — End: 2022-02-12
  Filled 2022-02-10: qty 0.3

## 2022-02-10 MED ORDER — SERTRALINE HCL 50 MG PO TABS
25.0000 mg | ORAL_TABLET | Freq: Every day | ORAL | Status: DC
  Administered 2022-02-10 – 2022-02-12 (×3): 25 mg via ORAL
  Filled 2022-02-10 (×3): qty 1

## 2022-02-10 MED ORDER — ACETAMINOPHEN 325 MG PO TABS: 650.0000 mg | ORAL_TABLET | Freq: Four times a day (QID) | ORAL | Status: DC | PRN

## 2022-02-10 MED ORDER — AMLODIPINE BESYLATE 5 MG PO TABS
5.0000 mg | ORAL_TABLET | Freq: Every day | ORAL | Status: DC
  Administered 2022-02-10 – 2022-02-12 (×3): 5 mg via ORAL
  Filled 2022-02-10 (×3): qty 1

## 2022-02-10 MED ORDER — DEXTROSE 50 % IV SOLN: 25.0000 mL | Freq: Once | INTRAVENOUS | Status: DC

## 2022-02-10 MED ORDER — DICLOFENAC SODIUM 1 % EX GEL: 2.0000 g | Freq: Two times a day (BID) | CUTANEOUS | Status: DC | PRN

## 2022-02-10 MED ORDER — PANTOPRAZOLE SODIUM 40 MG PO TBEC
40.0000 mg | DELAYED_RELEASE_TABLET | Freq: Every day | ORAL | Status: DC
  Administered 2022-02-11 – 2022-02-12 (×2): 40 mg via ORAL
  Filled 2022-02-10 (×3): qty 1

## 2022-02-10 MED ORDER — ACETAMINOPHEN 650 MG RE SUPP: 650.0000 mg | Freq: Four times a day (QID) | RECTAL | Status: DC | PRN

## 2022-02-10 MED ORDER — FUROSEMIDE 10 MG/ML IJ SOLN
40.0000 mg | Freq: Once | INTRAMUSCULAR | Status: AC
  Administered 2022-02-10: 40 mg via INTRAVENOUS
  Filled 2022-02-10: qty 4

## 2022-02-10 MED ORDER — NITROGLYCERIN 2 % TD OINT
1.0000 [in_us] | TOPICAL_OINTMENT | Freq: Once | TRANSDERMAL | Status: AC
  Administered 2022-02-10: 1 [in_us] via TOPICAL
  Filled 2022-02-10: qty 1

## 2022-02-10 MED ORDER — INSULIN GLARGINE 100 UNIT/ML SOLOSTAR PEN: 20.0000 [IU] | PEN_INJECTOR | Freq: Every morning | SUBCUTANEOUS | Status: DC

## 2022-02-10 MED ORDER — CARVEDILOL 25 MG PO TABS
25.0000 mg | ORAL_TABLET | Freq: Two times a day (BID) | ORAL | Status: DC
  Administered 2022-02-10 – 2022-02-12 (×4): 25 mg via ORAL
  Filled 2022-02-10 (×4): qty 1

## 2022-02-10 MED ORDER — DEXTROSE 50 % IV SOLN
1.0000 | INTRAVENOUS | Status: DC | PRN
Start: 2022-02-10 — End: 2022-02-12
  Administered 2022-02-11 (×2): 50 mL via INTRAVENOUS
  Filled 2022-02-10 (×2): qty 50

## 2022-02-10 MED ORDER — ASPIRIN EC 81 MG PO TBEC
81.0000 mg | DELAYED_RELEASE_TABLET | Freq: Every day | ORAL | Status: DC
  Administered 2022-02-10 – 2022-02-12 (×3): 81 mg via ORAL
  Filled 2022-02-10 (×3): qty 1

## 2022-02-10 MED ORDER — SORBITOL 70 % SOLN
300.0000 mL | TOPICAL_OIL | Freq: Once | ORAL | Status: AC
  Administered 2022-02-10: 300 mL via RECTAL
  Filled 2022-02-10: qty 90

## 2022-02-10 NOTE — H&P (Addendum)
?History and Physical  ? ? ?Patient: [name] UKG:[id number] DOB: Jan 20, 1941 ?DOA: 02/10/2022 ?DOS: the patient was seen and examined on 02/10/2022 ?PCP: Leeroy Cha, MD  ?Patient coming from: Home lives alone  ? ?Chief Complaint:  ?Chief Complaint  ?Patient presents with  ? Shortness of Breath  ? ?HPI: [name] is a 81 y.o. female with medical history significant of HTN, HLD, diastolic CHF last EF 55 to 60% with grade 2 diastolic dysfunction, CAD s/p 3v CABG, DM type II, and CKD stage IV presents with complaints of progressively worsening shortness of breath over the last 2 to 3 days.  Just recently hospitalized from 3/12-3/18 with acute respiratory failure with hypoxia secondary to community-acquired pneumonia treated with antibiotics.  After discharge patient had gone to rehab for short period of time before going back home.  She had just recently seen her cardiologist and primary care provider and seems to be doing okay.  Yesterday, patient had reported worsening shortness of breath with complaints of wheezing with chest/abdominal fullness, and some mild feet swelling.  She has never had to be hospitalized for congestive heart failure before, but notes that she retains fluid in her abdomen at times and usually improves with Lasix.  Patient notes associated symptoms of constipation with last bowel movement of yellowish mucus yesterday, nausea, 1 episode of vomiting, and poor appetite.  Denies having any significant fevers.  Her daughter notes that the patient had complained of some right upper quadrant pain while she had been at rehab, and reported similar pain yesterday evening. ? ? ?Upon admission to the emergency patient was noted to be afebrile with respirations 15-31, blood pressure elevated up to 219/73, and O2 saturation currently maintained on BiPAP.  Labs significant for hemoglobin 11, BUN 33, creatinine 1.83, glucose 187, BNP 2214.2, and  high-sensitivity troponin 31->36.  Chest  x-ray noted mild progressive interstitial edema to be cardiogenic in nature.  Nitroglycerin ointment was applied and patient was given Lasix 40 mg IV. ? ? ?Review of Systems: unable to review all systems due to the inability of the patient to answer questions. ?Past Medical History:  ?Diagnosis Date  ? Anemia   ? Anxiety   ? Arthritis   ? "in my hands; knees, back" (09/27/2018)  ? CAD (coronary artery disease)   ? a. 40-59% bilaterally 10/2015.  ? Chronic diastolic CHF (congestive heart failure) ([place])   ? a. 05/2016 Echo: EF 60-65%, no rwma, Gr1 DD, Ao sclerosis w/o stenosis, triv MR;  b. 07/2016 TEE: EF 55-60%, no rwma, mild MR.  ? Chronic headaches   ? CKD (chronic kidney disease), stage III ([place])   ? Coronary artery disease   ? a. 02/2007 Persantine MV: low risk;  b. 11/2011 CABG x 3 (LIMA->LAD, VG->OM, VG->RCA);  c. 05/2016 MV: EF >65%, no isch/infarct, horiz ST dep in I, II, V5-V6.  ? Depression   ? Diverticulosis   ? Esophageal stricture   ? GERD (gastroesophageal reflux disease)   ? Hemorrhoids   ? Hiatal hernia   ? Hyperkalemia   ? a. ARB stopped due to this.  ? Hyperlipidemia   ? Hypertension   ? Hypertensive heart disease   ? Hypothyroidism   ? Major depressive disorder with anxious distress 09/05/2019  ? Mild cognitive impairment   ? a. seen by neurology.  ? Mild vascular neurocognitive disorder 09/05/2019  ? Myocardial infarction ([place]) 12/07/2011  ? PAF (paroxysmal atrial fibrillation) ([place])   ? a. post-op CABG.  ?  Pain   ? RIGHT KNEE PAIN - TORN RIGHT MEDIAL MENISCUS  ? Paroxysmal atrial flutter ([place])   ? a. 07/2016 s/p TEE & DCCV;  b. 07/2016 Recurrent PAFlutter req initiation of amio & PPM in setting of tachy-brady;  c. CHA2DS2VASc = 7-->Xarelto 15 mg QD.  ? Pneumonia   ? "twice" (09/27/2018)  ? PONV (postoperative nausea and vomiting)   ? Presence of permanent cardiac pacemaker 08/12/2016  ? S/P CABG (coronary artery bypass graft), 12/04/11 12/07/2011  ? LIMA to LAD, SVG to OM, SVG to RCA  ? Sinus bradycardia    ? a. not on BB due to this.  ? Skin cancer   ? "face" (09/27/2018)  ? Tachy-brady syndrome ([place])   ? a. 07/2016 Jxnl brady following DCCV, recurrent Aflutter-->amio + SJM 2272 Assurity MRI DC PPM (ser # [id number]).  ? Type II diabetes mellitus ([place])   ? ?Past Surgical History:  ?Procedure Laterality Date  ? ABDOMINAL HYSTERECTOMY  1980's  ? ANKLE FRACTURE SURGERY Right   ? "put pins both side right ankle"  ? BACK SURGERY  2006  ? "cyst growing near my spine"  ? CARDIAC CATHETERIZATION  12/02/2011  ? mild LV dysfunction with mod hypocontractility of mid-distal anterolateral wall; CAD w/ostial tapering of L Main with 50% diffuse ostial narrowing of LAD, 99% eccentric focal prox LAD stenosis followed by 70% prox LAD stenosis after 1st diag, 20% mid LAD narrowing; 80% ostial-to-prox L Cfx stenosis & 40-50% irregularity of RCA (Dr. Corky Downs)  ? CARDIOVERSION N/A 08/11/2016  ? Procedure: CARDIOVERSION;  Surgeon: Lelon Perla, MD;  Location: [facility] ENDOSCOPY;  Service: Cardiovascular;  Laterality: N/A;  ? CATARACT EXTRACTION W/ INTRAOCULAR LENS  IMPLANT, BILATERAL Bilateral ~ 2010  ? [place]  ? CORONARY ARTERY BYPASS GRAFT  12/04/2011  ? Procedure: CORONARY ARTERY BYPASS GRAFTING (CABG);  Surgeon: Tharon Aquas Adelene Idler, MD;  Location: [place];  Service: Open Heart Surgery;  Laterality: N/A;  CABG x three,  using left internal mammary artery, and right leg greater saphenous vein harvested endoscopically  ? CORONARY STENT INTERVENTION N/A 09/27/2018  ? Procedure: CORONARY STENT INTERVENTION;  Surgeon: Troy Sine, MD;  Location: [place] CV LAB;  Service: Cardiovascular;  Laterality: N/A;  ? DILATION AND CURETTAGE OF UTERUS    ? "a couple times"  ? EP IMPLANTABLE DEVICE N/A 08/12/2016  ? Procedure: Pacemaker Implant;  Surgeon: Will Meredith Leeds, MD;  Location: [place] CV LAB;  Service: Cardiovascular;  Laterality: N/A;  ? ESOPHAGOGASTRODUODENOSCOPY (EGD) WITH ESOPHAGEAL DILATION    ? FRACTURE SURGERY    ?  JOINT REPLACEMENT    ? KNEE ARTHROSCOPY WITH MEDIAL MENISECTOMY Right 07/02/2014  ? Procedure: RIGHT KNEE ARTHROSCOPY WITH PARTIAL MEDIAL MENISTECTOMY, ABRASION CONDROPLASTYU OF PATELLA,ABRASION CONDROPLASTY OF MEDIAL FEMEROL CONDYL, MICROFRACTURE OF MEDIAL FEMEROL CONDYL;  Surgeon: Tobi Bastos, MD;  Location: [facility] ORS;  Service: Orthopedics;  Laterality: Right;  ? LEFT HEART CATH AND CORS/GRAFTS ANGIOGRAPHY N/A 09/06/2018  ? Procedure: LEFT HEART CATH AND CORS/GRAFTS ANGIOGRAPHY;  Surgeon: Troy Sine, MD;  Location: [place] CV LAB;  Service: Cardiovascular;  Laterality: N/A;  ? LEFT HEART CATHETERIZATION WITH CORONARY ANGIOGRAM N/A 12/02/2011  ? Procedure: LEFT HEART CATHETERIZATION WITH CORONARY ANGIOGRAM;  Surgeon: Troy Sine, MD;  Location: [facility] CATH LAB;  Service: Cardiovascular;  Laterality: N/A;  Coronary angiogram, possible PCI  ? TEE WITHOUT CARDIOVERSION N/A 08/11/2016  ? Procedure: TRANSESOPHAGEAL ECHOCARDIOGRAM (TEE);  Surgeon: Lelon Perla, MD;  Location: [facility] ENDOSCOPY;  Service: Cardiovascular;  Laterality: N/A;  ? TEE WITHOUT CARDIOVERSION N/A 05/18/2019  ? Procedure: TRANSESOPHAGEAL ECHOCARDIOGRAM (TEE);  Surgeon: [name], MD;  Location: [place];  Service: Cardiovascular;  Laterality: N/A;  ? TONSILLECTOMY  1949  ? TOTAL KNEE ARTHROPLASTY Left ~ 2006  ? TRANSTHORACIC ECHOCARDIOGRAM  02/19/2013  ? EF 93-26%, grade 1 diastolic dysfunction; mildly thickend/calcified AV leaflets; mildly calcidied MV annulus; mild TR  ? ?Social History:  reports that she has never smoked. She has never used smokeless tobacco. She reports that she does not currently use alcohol. She reports that she does not use drugs. ? ?Allergies  ?Allergen Reactions  ? Clonidine Derivatives Other (See Comments)  ?  Bradycardia and fatigue ?  ? Clonidine Hcl Other (See Comments)  ?  Bradycardia  ? Crestor [Rosuvastatin] Other (See Comments)  ?  Made the patient feel tired/weak  ? Losartan Potassium Other (See  Comments)  ?  Hyperkalemia  ? Sulfa Antibiotics Other (See Comments)  ?  Childhood reaction not recalled  ? Epinephrine Other (See Comments)  ?  Abnormal feeling. Dental exam/injection of local w/ epi.  ? H

## 2022-02-10 NOTE — ED Triage Notes (Signed)
Pt BIB EMS from home with c/o [facility] since yesterday morning. Pt was dx with pneumonia x 1.5 weeks ago. Pt's O2 93% on RA, placed on 2lpm for comfort. Pt has audible wheezing ?

## 2022-02-10 NOTE — Consult Note (Addendum)
?Cardiology Consultation:  ? ?Patient ID: [name] ?MRN: [id number]; DOB: Nov 20, 1940 ? ?Admit date: 02/10/2022 ?Date of Consult: 02/10/2022 ? ?PCP:  Leeroy Cha, MD ?  ?[facility] Providers ?Cardiologist:  [name], MD  ?Electrophysiologist:  Will Meredith Leeds, MD  { ? ? ?Patient Profile:  ? ?[name] is a 81 y.o. female with a hx of CAD status post three-vessel CABG '13 (LIMA to LAD, SVG to OM, SVG to RCA), CKD stage III, hypertension, hyperlipidemia, hypothyroidism, postop atrial fibrillation, tachybradycardia syndrome status post PPM, diabetes, GI bleed and HFpEF who is being seen 02/10/2022 for the evaluation of CHF at the request of Dr. Tamala Julian. ? ?History of Present Illness:  ? ?[name] is an 81 year old female with past medical history noted above.  She has been followed by Dr. Debara Pickett as an outpatient as well as Dr. Curt Bears for her device.  Underwent three-vessel CABG 2013, LIMA to LAD, SVG to OM, SVG to RCA.  Echocardiogram at that time showed LVEF of 55 to 33%, stage I diastolic dysfunction, mildly thickened and calcified aortic valve leaflets with no stenosis and mild TR. In 07/2016 she presented with atypical atrial flutter and attempted TEE/DCCV.  Developed recurrent atrial flutter, also bradycardia.  Her AAD options were noted to be limited.  She underwent dual-chamber pacemaker with Dr. Curt Bears 07/2016.  Developed progressive shortness of breath and chest tightness 08/2018 and underwent stress testing which showed new area of ischemia in OM territory.  Underwent cardiac catheterization which showed high grade graft disease with 95% ostial stenosis in the SVG to marginal vessel which correlated with her nuclear stress images.  Given her CKD she underwent staged PCI 09/2018 which was successful.  Placed on Plavix and Xarelto.  ? ?05/22/2019, in preparation for discharge after a bout of bacteremia in the presence of cat scratch fever.  She underwent TEE that showed no  evidence of endocarditis, and her PICC line was still in place for home antibiotics.  During this admission she was also found to be anemic due to acute blood loss anemia (found to have intramuscular hemorrhage with extension into the left retroperitoneum), her hemoglobin had dipped to 5.4, she received 2 units PRBCs, 1 FFP and her hemoglobin at discharge was 10.6.  Her pacemaker was programmed to report any episodes of atrial arrhythmia greater than 6 minutes. ? ?Had recurrent chest pressure 12/2019 and underwent Lexiscan Myoview which showed moderate sized partially reversible perfusion defect in apical inferior, apical lateral and apex territories.  It was felt to be attenuation artifact. ? ?Admitted 07/2021 with chest pain and small NSTEMI.  Echocardiogram showed LVEF 50 to 55%, global hypokinesis, grade 1 diastolic dysfunction, mildly reduced RV, trivial MR.  ? ?Last seen in the office 01/22/2022 for follow-up.  Noted she was hospitalized with presumed pneumonia versus heart failure prior to visit.  Repeat echocardiogram during that admission showed normal LV function with grade 2 diastolic dysfunction and elevated LV filling pressures with an RVSP of 39 mmHg.  She was treated with antibiotics, diuresis and return to SNF.  No issues with Afib/flutter during that admission.  ? ?Presented to the ED on 4/19 with complaints of worsening shortness of breath.  Stated she had done well since her recent admission and actually discharged from SNF to home.  States that day prior to presentation she developed worsening shortness of breath with wheezing, abdominal fullness and worsening lower extremity edema. No chest pain. Weights per her report have been stable.  ? ?  On arrival to the ED she required the use of Bipap. Labs in the ED showed sodium 142, potassium 5, creatinine 1.8, mag 2.3, BNP 2214, high-sensitivity troponin 31>>36, WBC 7.8, hemoglobin 11.  EKG showed atrial paced rhythm, 60 bpm, prolonged QT.  Chest x-ray  showed mild progressive interstitial pulmonary edema.  COVID/flu negative.  She was admitted to internal medicine for further management.  Cardiology consulted in regards to her CHF. ? ?She reports compliance with her medications, daughter actually helps with setting up her pill box. Lives independently, actually has a grandson with her right now. Cooks for herself, though does use frozen meals, and canned foods.  ? ?Past Medical History:  ?Diagnosis Date  ? Anemia   ? Anxiety   ? Arthritis   ? "in my hands; knees, back" (09/27/2018)  ? CAD (coronary artery disease)   ? a. 40-59% bilaterally 10/2015.  ? Chronic diastolic CHF (congestive heart failure) ([place])   ? a. 05/2016 Echo: EF 60-65%, no rwma, Gr1 DD, Ao sclerosis w/o stenosis, triv MR;  b. 07/2016 TEE: EF 55-60%, no rwma, mild MR.  ? Chronic headaches   ? CKD (chronic kidney disease), stage III ([place])   ? Coronary artery disease   ? a. 02/2007 Persantine MV: low risk;  b. 11/2011 CABG x 3 (LIMA->LAD, VG->OM, VG->RCA);  c. 05/2016 MV: EF >65%, no isch/infarct, horiz ST dep in I, II, V5-V6.  ? Depression   ? Diverticulosis   ? Esophageal stricture   ? GERD (gastroesophageal reflux disease)   ? Hemorrhoids   ? Hiatal hernia   ? Hyperkalemia   ? a. ARB stopped due to this.  ? Hyperlipidemia   ? Hypertension   ? Hypertensive heart disease   ? Hypothyroidism   ? Major depressive disorder with anxious distress 09/05/2019  ? Mild cognitive impairment   ? a. seen by neurology.  ? Mild vascular neurocognitive disorder 09/05/2019  ? Myocardial infarction ([place]) 12/07/2011  ? PAF (paroxysmal atrial fibrillation) ([place])   ? a. post-op CABG.  ? Pain   ? RIGHT KNEE PAIN - TORN RIGHT MEDIAL MENISCUS  ? Paroxysmal atrial flutter ([place])   ? a. 07/2016 s/p TEE & DCCV;  b. 07/2016 Recurrent PAFlutter req initiation of amio & PPM in setting of tachy-brady;  c. CHA2DS2VASc = 7-->Xarelto 15 mg QD.  ? Pneumonia   ? "twice" (09/27/2018)  ? PONV (postoperative nausea and vomiting)   ? Presence of  permanent cardiac pacemaker 08/12/2016  ? S/P CABG (coronary artery bypass graft), 12/04/11 12/07/2011  ? LIMA to LAD, SVG to OM, SVG to RCA  ? Sinus bradycardia   ? a. not on BB due to this.  ? Skin cancer   ? "face" (09/27/2018)  ? Tachy-brady syndrome ([place])   ? a. 07/2016 Jxnl brady following DCCV, recurrent Aflutter-->amio + SJM 2272 Assurity MRI DC PPM (ser # [id number]).  ? Type II diabetes mellitus ([place])   ? ? ?Past Surgical History:  ?Procedure Laterality Date  ? ABDOMINAL HYSTERECTOMY  1980's  ? ANKLE FRACTURE SURGERY Right   ? "put pins both side right ankle"  ? BACK SURGERY  2006  ? "cyst growing near my spine"  ? CARDIAC CATHETERIZATION  12/02/2011  ? mild LV dysfunction with mod hypocontractility of mid-distal anterolateral wall; CAD w/ostial tapering of L Main with 50% diffuse ostial narrowing of LAD, 99% eccentric focal prox LAD stenosis followed by 70% prox LAD stenosis after 1st diag, 20% mid LAD narrowing; 80% ostial-to-prox L  Cfx stenosis & 40-50% irregularity of RCA (Dr. Corky Downs)  ? CARDIOVERSION N/A 08/11/2016  ? Procedure: CARDIOVERSION;  Surgeon: Lelon Perla, MD;  Location: [facility] ENDOSCOPY;  Service: Cardiovascular;  Laterality: N/A;  ? CATARACT EXTRACTION W/ INTRAOCULAR LENS  IMPLANT, BILATERAL Bilateral ~ 2010  ? [place]  ? CORONARY ARTERY BYPASS GRAFT  12/04/2011  ? Procedure: CORONARY ARTERY BYPASS GRAFTING (CABG);  Surgeon: Tharon Aquas Adelene Idler, MD;  Location: [facility];  Service: Open Heart Surgery;  Laterality: N/A;  CABG x three,  using left internal mammary artery, and right leg greater saphenous vein harvested endoscopically  ? CORONARY STENT INTERVENTION N/A 09/27/2018  ? Procedure: CORONARY STENT INTERVENTION;  Surgeon: Troy Sine, MD;  Location: [place] CV LAB;  Service: Cardiovascular;  Laterality: N/A;  ? DILATION AND CURETTAGE OF UTERUS    ? "a couple times"  ? EP IMPLANTABLE DEVICE N/A 08/12/2016  ? Procedure: Pacemaker Implant;  Surgeon: Will Meredith Leeds, MD;   Location: [place] [facility] LAB;  Service: Cardiovascular;  Laterality: N/A;  ? ESOPHAGOGASTRODUODENOSCOPY (EGD) WITH ESOPHAGEAL DILATION    ? FRACTURE SURGERY    ? JOINT REPLACEMENT    ? KNEE ARTHROSCOPY WITH

## 2022-02-10 NOTE — ED Notes (Signed)
Pt trial off bipap, remained between 97-100% on room air. ?

## 2022-02-10 NOTE — ED Notes (Signed)
At approx 5:00, pt shortness of breath became more severe: pt in tripod position, increased RR, diaphoretic, increased wheezing, reporting distress. RT called to bedside. Pt placed on bipad with noted relief post application. Pt comfortable at this time.  ? ?

## 2022-02-10 NOTE — ED Notes (Signed)
RT at bedside.

## 2022-02-10 NOTE — Progress Notes (Signed)
Inpatient Diabetes Program Recommendations ? ?AACE/ADA: New Consensus Statement on Inpatient Glycemic Control (2015) ? ?Target Ranges:  Prepandial:   less than 140 mg/dL ?     Peak postprandial:   less than 180 mg/dL (1-2 hours) ?     Critically ill patients:  140 - 180 mg/dL  ? ?Lab Results  ?Component Value Date  ? GLUCAP 237 (H) 01/09/2022  ? HGBA1C 8.6 (H) 10/04/2020  ? ? ?Review of Glycemic Control ? Latest Reference Range & Units 02/10/22 03:57  ?Glucose 70 - 99 mg/dL 187 (H)  ? ?Diabetes history: DM 2 ?Outpatient Diabetes medications: Lantus 20 units qam ?Current orders for Inpatient glycemic control:  ?none ? ?Inpatient Diabetes Program Recommendations:   ? ?-  Add Novolog 0-9 units tid + hs scale ? ?Thanks, ? ?Tama Headings RN, MSN, BC-ADM ?Inpatient Diabetes Coordinator ?Team Pager [phone] (8a-5p) ? ? ?

## 2022-02-10 NOTE — ED Provider Triage Note (Signed)
?  Emergency Medicine Provider Triage Evaluation Note ? ?MRN:  [id number]  ?Arrival date & time: 02/10/22    ?Medically screening exam initiated at 3:42 AM.   ?CC:   ?Shortness of Breath ?  ?HPI:  ?[name] is a 81 y.o. year-old female presents to the ED with chief complaint of SOB since yesterday.  Recent dx of pneumonia.  Denies cough.  Hx of CHF.  Wheezing on exam.  ? ?History provided by History provided by patient. ?ROS:  ?-As included in HPI ?PE:  ?There were no vitals filed for this visit.  ?Non-toxic appearing ?Mild respiratory distress, increased WOB ? ?MDM:  ?Based on signs and symptoms, CHF exacerbation is highest on my differential, followed by pneumonia, PE, ACS. ?I've ordered labs and imaging in triage to expedite lab/diagnostic workup. ? ?Patient was informed that the remainder of the evaluation will be completed by another provider, this initial triage assessment does not replace that evaluation, and the importance of remaining in the ED until their evaluation is complete. ? ?  ?Montine Circle, PA-C ?02/10/22 0344 ? ?

## 2022-02-10 NOTE — ED Provider Notes (Signed)
?[facility] ?Provider Note ? ? ?CSN: [id number] ?Arrival date & time: 02/10/22  0335 ? ?  ? ?History ? ?Chief Complaint  ?Patient presents with  ? Shortness of Breath  ? ? ?[name] is a 81 y.o. female. ? ?The history is provided by the patient.  ?Shortness of Breath ?Severity:  Severe ?Onset quality:  Gradual ?Duration:  1 day ?Timing:  Constant ?Progression:  Unchanged ?Chronicity:  New ?Context: not URI   ?Relieved by:  Nothing ?Worsened by:  Nothing ?Ineffective treatments:  None tried ?Associated symptoms: wheezing   ?Associated symptoms: no abdominal pain and no fever   ?Risk factors: no recent surgery   ?Patient with CHF presents with SOB and wheezing for one day.  No f/c/r.   ?  ?Past Medical History:  ?Diagnosis Date  ? Anemia   ? Anxiety   ? Arthritis   ? "in my hands; knees, back" (09/27/2018)  ? CAD (coronary artery disease)   ? a. 40-59% bilaterally 10/2015.  ? Chronic diastolic CHF (congestive heart failure) ([place])   ? a. 05/2016 Echo: EF 60-65%, no rwma, Gr1 DD, Ao sclerosis w/o stenosis, triv MR;  b. 07/2016 TEE: EF 55-60%, no rwma, mild MR.  ? Chronic headaches   ? CKD (chronic kidney disease), stage III ([place])   ? Coronary artery disease   ? a. 02/2007 Persantine MV: low risk;  b. 11/2011 CABG x 3 (LIMA->LAD, VG->OM, VG->RCA);  c. 05/2016 MV: EF >65%, no isch/infarct, horiz ST dep in I, II, V5-V6.  ? Depression   ? Diverticulosis   ? Esophageal stricture   ? GERD (gastroesophageal reflux disease)   ? Hemorrhoids   ? Hiatal hernia   ? Hyperkalemia   ? a. ARB stopped due to this.  ? Hyperlipidemia   ? Hypertension   ? Hypertensive heart disease   ? Hypothyroidism   ? Major depressive disorder with anxious distress 09/05/2019  ? Mild cognitive impairment   ? a. seen by neurology.  ? Mild vascular neurocognitive disorder 09/05/2019  ? Myocardial infarction ([place]) 12/07/2011  ? PAF (paroxysmal atrial fibrillation) ([place])   ? a. post-op CABG.  ? Pain   ? RIGHT KNEE PAIN  - TORN RIGHT MEDIAL MENISCUS  ? Paroxysmal atrial flutter ([place])   ? a. 07/2016 s/p TEE & DCCV;  b. 07/2016 Recurrent PAFlutter req initiation of amio & PPM in setting of tachy-brady;  c. CHA2DS2VASc = 7-->Xarelto 15 mg QD.  ? Pneumonia   ? "twice" (09/27/2018)  ? PONV (postoperative nausea and vomiting)   ? Presence of permanent cardiac pacemaker 08/12/2016  ? S/P CABG (coronary artery bypass graft), 12/04/11 12/07/2011  ? LIMA to LAD, SVG to OM, SVG to RCA  ? Sinus bradycardia   ? a. not on BB due to this.  ? Skin cancer   ? "face" (09/27/2018)  ? Tachy-brady syndrome ([place])   ? a. 07/2016 Jxnl brady following DCCV, recurrent Aflutter-->amio + SJM 2272 Assurity MRI DC PPM (ser # [id number]).  ? Type II diabetes mellitus ([place])   ? ? ?Home Medications ?Prior to Admission medications   ?Medication Sig Start Date End Date Taking? Authorizing Provider  ?acetaminophen (TYLENOL) 650 MG CR tablet Take 650 mg by mouth every 8 (eight) hours as needed for pain.    [provider]  ?allopurinol (ZYLOPRIM) 100 MG tablet Take 100 mg by mouth daily.    [provider]  ?amLODipine (NORVASC) 5 MG tablet Take 1 tablet (  5 mg total) by mouth daily. 09/14/21 02/03/22  [name], MD  ?aspirin EC 81 MG tablet Take 1 tablet (81 mg total) by mouth daily. 01/12/22   [place] Donnamarie Poag, DO  ?carvedilol (COREG) 25 MG tablet Take 0.5 tablets (12.5 mg total) by mouth 2 (two) times daily. 09/14/21   [name], MD  ?diclofenac Sodium (VOLTAREN) 1 % GEL Apply 2 g topically 2 (two) times daily as needed (pain).    [provider]  ?docusate sodium (COLACE) 100 MG capsule Take 100 mg by mouth in the morning and at bedtime.    [provider]  ?FEROSUL 325 (65 Fe) MG tablet Take 325 mg by mouth 2 (two) times a week. 04/21/21   [provider]  ?gabapentin (NEURONTIN) 300 MG capsule Take 1 capsule (300 mg total) by mouth at bedtime. 05/22/19   Roney Jaffe, MD  ?isosorbide mononitrate (IMDUR) 30 MG 24 hr  tablet Take 1 tablet by mouth once daily ?Patient taking differently: Take 30 mg by mouth in the morning. 10/07/21   [name], MD  ?LANTUS SOLOSTAR 100 UNIT/ML Solostar Pen Inject 10 Units into the skin in the morning. 01/09/22   [place] Donnamarie Poag, DO  ?meclizine (ANTIVERT) 25 MG tablet Take 25 mg by mouth daily as needed for dizziness.    [provider]  ?nitroGLYCERIN (NITROSTAT) 0.4 MG SL tablet Place 1 tablet (0.4 mg total) under the tongue every 5 (five) minutes as needed for chest pain (max 3 doses). ?Patient not taking: Reported on 01/22/2022 02/11/20 01/22/22  [name], MD  ?ondansetron (ZOFRAN-ODT) 4 MG disintegrating tablet Take 4 mg by mouth every 8 (eight) hours as needed for nausea or vomiting (dissolve orally). 12/05/20   [provider]  ?pantoprazole (PROTONIX) 40 MG tablet Take 40 mg by mouth daily before breakfast. 06/24/20   [provider]  ?Polyethyl Glycol-Propyl Glycol (SYSTANE OP) Place 1 drop into both eyes 2 (two) times daily as needed (dry eyes).    [provider]  ?rosuvastatin (CRESTOR) 10 MG tablet Take 10 mg by mouth at bedtime. 01/28/21   [provider]  ?SYNTHROID 100 MCG tablet Take 100 mcg by mouth at bedtime. 03/26/21   [provider]  ?torsemide (DEMADEX) 20 MG tablet Take 2 tablets by mouth once daily 01/25/22   Hilty, Nadean Corwin, MD  ?   ? ?Allergies    ?Clonidine derivatives, Clonidine hcl, Crestor [rosuvastatin], Losartan potassium, Sulfa antibiotics, Epinephrine, and Hydralazine hcl   ? ?Review of Systems   ?Review of Systems  ?Constitutional:  Negative for fever.  ?HENT:  Negative for facial swelling.   ?Eyes:  Negative for redness.  ?Respiratory:  Positive for shortness of breath and wheezing.   ?Cardiovascular:  Negative for leg swelling.  ?Gastrointestinal:  Negative for abdominal pain.  ?Neurological:  Negative for facial asymmetry.  ?Psychiatric/Behavioral:  Negative for agitation.   ?All other systems reviewed  and are negative. ? ?Physical Exam ?Updated Vital Signs ?BP (!) 219/73   Pulse 60   Temp 98.1 ?F (36.7 ?C) (Oral)   Resp (!) 23   Ht '5\' 1"'$  (1.549 m)   Wt 60 kg   SpO2 99%   BMI 24.99 kg/m?  ?Physical Exam ?Vitals and nursing note reviewed. Exam conducted with a chaperone present.  ?Constitutional:   ?   Appearance: Normal appearance. She is not diaphoretic.  ?HENT:  ?   Head: Normocephalic and atraumatic.  ?   Nose: Nose  normal.  ?Eyes:  ?   Conjunctiva/sclera: Conjunctivae normal.  ?   Pupils: Pupils are equal, round, and reactive to light.  ?Cardiovascular:  ?   Rate and Rhythm: Normal rate and regular rhythm.  ?   Pulses: Normal pulses.  ?   Heart sounds: Normal heart sounds.  ?Pulmonary:  ?   Breath sounds: Wheezing and rales present.  ?Abdominal:  ?   General: Bowel sounds are normal.  ?   Palpations: Abdomen is soft.  ?   Tenderness: There is no abdominal tenderness. There is no guarding.  ?Musculoskeletal:     ?   General: Normal range of motion.  ?   Cervical back: Normal range of motion and neck supple.  ?   Right lower leg: No edema.  ?   Left lower leg: No edema.  ?Skin: ?   General: Skin is warm and dry.  ?   Capillary Refill: Capillary refill takes less than 2 seconds.  ?Neurological:  ?   General: No focal deficit present.  ?   Mental Status: She is alert.  ?   Deep Tendon Reflexes: Reflexes normal.  ?Psychiatric:     ?   Mood and Affect: Mood normal.     ?   Behavior: Behavior normal.  ? ? ?ED Results / Procedures / Treatments   ?Labs ?(all labs ordered are listed, but only abnormal results are displayed) ?Results for orders placed or performed during the hospital encounter of 02/10/22  ?Basic metabolic panel  ?Result Value Ref Range  ? Sodium 142 135 - 145 mmol/L  ? Potassium 5.0 3.5 - 5.1 mmol/L  ? Chloride 109 98 - 111 mmol/L  ? CO2 26 22 - 32 mmol/L  ? Glucose, Bld 187 (H) 70 - 99 mg/dL  ? BUN 33 (H) 8 - 23 mg/dL  ? Creatinine, Ser 1.83 (H) 0.44 - 1.00 mg/dL  ? Calcium 8.9 8.9 - 10.3 mg/dL   ? GFR, Estimated 27 (L) >60 mL/min  ? Anion gap 7 5 - 15  ?Magnesium  ?Result Value Ref Range  ? Magnesium 2.3 1.7 - 2.4 mg/dL  ?Brain natriuretic peptide (order ONLY if patient c/o SOB)  ?Result Value R

## 2022-02-11 DIAGNOSIS — I5033 Acute on chronic diastolic (congestive) heart failure: Secondary | ICD-10-CM | POA: Diagnosis not present

## 2022-02-11 LAB — BASIC METABOLIC PANEL
Anion gap: 6 (ref 5–15)
BUN: 34 mg/dL — ABNORMAL HIGH (ref 8–23)
CO2: 25 mmol/L (ref 22–32)
Calcium: 8.2 mg/dL — ABNORMAL LOW (ref 8.9–10.3)
Chloride: 111 mmol/L (ref 98–111)
Creatinine, Ser: 2.17 mg/dL — ABNORMAL HIGH (ref 0.44–1.00)
GFR, Estimated: 22 mL/min — ABNORMAL LOW (ref >60)
Glucose, Bld: 83 mg/dL (ref 70–99)
Potassium: 4.1 mmol/L (ref 3.5–5.1)
Sodium: 142 mmol/L (ref 135–145)

## 2022-02-11 LAB — GLUCOSE, CAPILLARY
Glucose-Capillary: 62 mg/dL — ABNORMAL LOW (ref 70–99)
Glucose-Capillary: 53 mg/dL — ABNORMAL LOW (ref 70–99)
Glucose-Capillary: 156 mg/dL — ABNORMAL HIGH (ref 70–99)
Glucose-Capillary: 171 mg/dL — ABNORMAL HIGH (ref 70–99)
Glucose-Capillary: 159 mg/dL — ABNORMAL HIGH (ref 70–99)

## 2022-02-11 LAB — TSH: TSH: 0.135 u[IU]/mL — ABNORMAL LOW (ref 0.350–4.500)

## 2022-02-11 LAB — HEMOGLOBIN A1C
Hgb A1c MFr Bld: 6.6 % — ABNORMAL HIGH (ref 4.8–5.6)
Mean Plasma Glucose: 142.72 mg/dL

## 2022-02-11 LAB — T4, FREE: Free T4: 1.5 ng/dL — ABNORMAL HIGH (ref 0.61–1.12)

## 2022-02-11 MED ORDER — MELATONIN 3 MG PO TABS
3.0000 mg | ORAL_TABLET | Freq: Every evening | ORAL | Status: DC | PRN
  Administered 2022-02-11: 3 mg via ORAL
  Filled 2022-02-11: qty 1

## 2022-02-11 MED ORDER — POLYETHYLENE GLYCOL 3350 17 G PO PACK
17.0000 g | PACK | Freq: Every day | ORAL | Status: DC
  Administered 2022-02-12: 17 g via ORAL
  Filled 2022-02-11 (×2): qty 1

## 2022-02-11 NOTE — Progress Notes (Signed)
Heart Failure Navigator Progress Note ? ?Following this hospitalization to assess for HV TOC readiness.  ? ?EF 55-60% ?SCr 2.17 ? ?Earnestine Leys, BSN, RN ?Heart Failure Nurse Navigator ?Secure Chat Only  ?

## 2022-02-11 NOTE — Progress Notes (Signed)
Inpatient Diabetes Program Recommendations ? ?AACE/ADA: New Consensus Statement on Inpatient Glycemic Control (2015) ? ?Target Ranges:  Prepandial:   less than 140 mg/dL ?     Peak postprandial:   less than 180 mg/dL (1-2 hours) ?     Critically ill patients:  140 - 180 mg/dL  ? ?Lab Results  ?Component Value Date  ? GLUCAP 156 (H) 02/11/2022  ? HGBA1C 6.6 (H) 02/11/2022  ? ? Latest Reference Range & Units 02/10/22 15:30 02/10/22 21:11 02/11/22 01:14 02/11/22 06:16 02/11/22 09:43  ?Glucose-Capillary 70 - 99 mg/dL 106 (H) 115 (H) 62 (L) 53 (L) 156 (H)  ?(H): Data is abnormally high ?(L): Data is abnormally low ?Review of Glycemic Control ? ?Diabetes history: type 2 ?Outpatient Diabetes medications: Lantus 20 units daily ?Current orders for Inpatient glycemic control: Semglee 20 units daily, Novolog 0-6 units TID correction scale ? ?Inpatient Diabetes Program Recommendations:   ?Noted that CBGs have been less than 70 mg/dl. ? ?Recommend decreasing Semglee to 15 units daily and continuing Novolog 0-6 units correction scale TID. ? ?Harvel Ricks RN BSN CDE ?Diabetes Coordinator ?Pager: [phone]  8am-5pm  ? ? ? ?

## 2022-02-11 NOTE — TOC Initial Note (Signed)
Transition of Care (TOC) - Initial/Assessment Note  ? ? ?Patient Details  ?Name: [name] ?MRN: [id number] ?Date of Birth: December 06, 1940 ? ?Transition of Care (TOC) CM/SW Contact:    ?Zenon Mayo, RN ?Phone Number: ?02/11/2022, 4:56 PM ? ?Clinical Narrative:                 ?From home , grandson will be staying with her for a couple of weeks.  NCM offered choice, She is active with [facility] for [facility] [place]. Will like to resume.  NCM confirmed with Saddie Benders.  Patient states  her daughter will transport her home at dc. She states she goes to [facility] in [place].  TOC will continue to follow for dc needs.  ? ?Expected Discharge Plan: [place] ?Barriers to Discharge: Continued Medical Work up ? ? ?Patient Goals and CMS Choice ?Patient states their goals for this hospitalization and ongoing recovery are:: return home ?CMS Medicare.gov Compare Post Acute Care list provided to:: Patient ?Choice offered to / list presented to : Patient ? ?Expected Discharge Plan and Services ?Expected Discharge Plan: [place] ?  ?Discharge Planning Services: CM Consult ?Post Acute Care Choice: Home Health ?Living arrangements for the past 2 months: [place] ?                ?  ?DME Agency: NA ?  ?  ?  ?HH Arranged: RN, PT, OT ?[place] Agency: [facility] ?Date HH Agency Contacted: 02/11/22 ?Time [place]: [phone] ?Representative spoke with at [facility]: Jeanette Caprice ? ?Prior Living Arrangements/Services ?Living arrangements for the past 2 months: [place] ?Lives with:: Adult Children ?Patient language and need for interpreter reviewed:: Yes ?Do you feel safe going back to the place where you live?: Yes      ?Need for Family Participation in Patient Care: Yes (Comment) ?Care giver support system in place?: Yes (comment) ?Current home services: DME (walker, cane,) ?Criminal Activity/Legal Involvement Pertinent to Current  Situation/Hospitalization: No - Comment as needed ? ?Activities of Daily Living ?Home Assistive Devices/Equipment: Hearing aid, Gilford Rile (specify type), Eyeglasses, Blood pressure cuff, Shower chair with back, Hand-held shower hose, Dentures (specify type), Cane (specify quad or straight), Scales (rolling walker) ?ADL Screening (condition at time of admission) ?Patient's cognitive ability adequate to safely complete daily activities?: Yes ?Is the patient deaf or have difficulty hearing?: Yes ?Does the patient have difficulty seeing, even when wearing glasses/contacts?: No ?Does the patient have difficulty concentrating, remembering, or making decisions?: No ?Patient able to express need for assistance with ADLs?: Yes ?Does the patient have difficulty dressing or bathing?: No ?Independently performs ADLs?: Yes (appropriate for developmental age) ?Does the patient have difficulty walking or climbing stairs?: No ?Weakness of Legs: Both (a some weakness but not much) ?Weakness of Arms/Hands: None ? ?Permission Sought/Granted ?  ?  ?   ?   ?   ?   ? ?Emotional Assessment ?Appearance:: Appears stated age ?Attitude/Demeanor/Rapport: Engaged ?Affect (typically observed): Appropriate ?Orientation: : Oriented to  Time, Oriented to Situation, Oriented to Place, Oriented to Self ?Alcohol / Substance Use: Not Applicable ?Psych Involvement: No (comment) ? ?Admission diagnosis:  Acute pulmonary edema ([place]) [J81.0] ?Acute CHF (congestive heart failure) (HCC) [I50.9] ?Acute on chronic diastolic CHF (congestive heart failure) ([place]) [I50.33] ?Patient Active Problem List  ? Diagnosis Date Noted  ? Acute CHF (congestive heart failure) ([place]) 02/10/2022  ? Epistaxis 01/08/2022  ? Community acquired pneumonia  01/03/2022  ? Acute renal failure superimposed on stage 4 chronic kidney disease ([place]) 01/03/2022  ? Acute respiratory failure with hypoxia ([place]) 01/03/2022  ? Chest pain 08/10/2021  ? CKD (chronic kidney disease), stage III ([place])  08/10/2021  ? Acute kidney injury superimposed on chronic kidney disease ([place]) 10/03/2020  ? Nausea & vomiting 10/03/2020  ? Abdominal pain 10/03/2020  ? Mild vascular neurocognitive disorder 09/05/2019  ? Major depressive disorder with anxious distress 09/05/2019  ? Coag negative Staphylococcus bacteremia 05/21/2019  ? Acute blood loss anemia 05/21/2019  ? Anemia of chronic disease 05/21/2019  ? Anemia in chronic kidney disease 05/21/2019  ? SSS (sick sinus syndrome) ([place])   ? Paroxysmal atrial fibrillation (HCC)   ? Acute metabolic encephalopathy   ? Retroperitoneal hematoma   ? Muscle hemorrhage   ? S/P placement of cardiac pacemaker   ? SIRS (systemic inflammatory response syndrome) ([place]) 05/15/2019  ? Acute encephalopathy 05/15/2019  ? Status post coronary artery stent placement   ? CAD S/P percutaneous coronary angioplasty 09/27/2018  ? Abnormal nuclear stress test   ? Pain of left shoulder joint on movement 06/16/2018  ? Closed fracture of upper end of humerus 06/16/2018  ? Pain in right knee 04/12/2018  ? Spinal stenosis of lumbar region 02/28/2018  ? On anticoagulant therapy 04/22/2017  ? Tachycardia-bradycardia syndrome ([place]) 12/23/2016  ? Accelerated hypertension   ? Acute on chronic diastolic CHF (congestive heart failure) ([place]) 08/09/2016  ? Elevated troponin 08/09/2016  ? Hypertensive urgency 08/08/2016  ? Paroxysmal atrial flutter ([place]) 08/08/2016  ? Coronary artery disease   ? Hypertensive heart disease   ? CKD (chronic kidney disease) stage 4, GFR 15-29 ml/min (HCC)   ? Hypothyroidism   ? Acute anemia   ? Pain in the chest   ? Shortness of breath 05/23/2016  ? Pre-syncope 05/23/2016  ? Chest tightness 09/16/2014  ? Osteoarthritis of right knee 07/02/2014  ? Acute medial meniscus tear of right knee 07/02/2014  ? Diabetic peripheral neuropathy associated with type 1 diabetes mellitus ([place]) 09/05/2013  ? Edema of extremities 04/18/2013  ? Cellulitis 04/18/2013  ? Hypertension associated with diabetes  ([place]) 01/06/2012  ? Acute renal failure,admitted 01/03/12, after admission for diastolic chf 62/69/4854  ? S/P CABG (coronary artery bypass graft), 12/04/11 12/07/2011  ? Hyperlipidemia associated with type 2 diabetes mellitus ([place]) 12/03/2011  ?  Class: Diagnosis of  ? NSTEMI (non-ST elevated myocardial infarction) ([place]) 12/01/2011  ? Family history of early CAD 12/01/2011  ? Insulin dependent type 2 diabetes mellitus ([place]) 12/14/2010  ? DYSPHAGIA UNSPECIFIED 12/14/2010  ? ABDOMINAL PAIN, EPIGASTRIC 12/14/2010  ? ESOPHAGEAL STRICTURE 11/28/2009  ? GERD 11/28/2009  ? FLATULENCE-GAS-BLOATING 11/28/2009  ? CONSTIPATION, CHRONIC 12/19/2007  ? SALPINGO-OOPHORITIS 12/19/2007  ? MENOPAUSE, SURGICAL 12/19/2007  ? BACK PAIN, LUMBAR 12/19/2007  ? ANKLE INJURY, RIGHT 12/19/2007  ? TOTAL KNEE REPLACEMENT, LEFT, HX OF 12/19/2007  ? ?PCP:  Leeroy Cha, MD ?Pharmacy:   ?[facility] [address] ?[facility] [place] [address] ?Phone: [phone] Fax: [phone] ? ? ? ? ?Social Determinants of Health (SDOH) Interventions ?  ? ?Readmission Risk Interventions ? ?  02/11/2022  ?  4:50 PM  ?Readmission Risk Prevention Plan  ?Transportation Screening Complete  ?PCP or Specialist Appt within 3-5 Days Complete  ?[place] or Home Care Consult Complete  ?Social Work Consult for [place] Planning/Counseling Complete  ?Palliative Care Screening Not Applicable  ?Medication Review Press photographer) Complete  ? ? ? ?

## 2022-02-11 NOTE — Progress Notes (Signed)
?PROGRESS NOTE ? ? ? ?[name]  ZDG:[id number] DOB: May 30, 1941 DOA: 02/10/2022 ?PCP: Leeroy Cha, MD  ?Outpatient Specialists:  ? ? ? ?Brief Narrative:  ?Patient is an 81 year old female with past medical history significant for hypertension, hyperlipidemia, diastolic congestive heart failure with EF of 55 to 95%, grade 2 diastolic dysfunction, coronary artery disease status post three-vessel CABG, diabetes mellitus type 2 and chronic kidney disease stage IV.  Patient was admitted with acute on chronic diastolic CHF (with exacerbation) and hypertensive urgency (blood pressure of 219/73 mmHg).  On presentation, BUN was 33 with serum creatinine of 1.83, BNP of 2214 and troponin of 31-36 (None rising).  Patient was initially on BiPAP.  Cardiology input is appreciated.  Patient is improving with diuresis.  Patient is not back to baseline. ? ?02/11/2022: Patient seen alongside patient's daughter.  Patient's nurse was also present during the presentation.  Patient is slowly improving.  Cardiology input is appreciated.  Volume overload is improving.  Increased abdominal girth is trending towards normal.  No leg edema.  No fever or chills.  No chest pain.  No nausea or vomiting.  No urinary symptoms. ? ? ?Assessment & Plan: ?  ?Principal Problem: ?  Acute on chronic diastolic CHF (congestive heart failure) ([place]) ?Active Problems: ?  Insulin dependent type 2 diabetes mellitus ([place]) ?  S/P CABG (coronary artery bypass graft), 12/04/11 ?  Hypothyroidism ?  CKD (chronic kidney disease) stage 4, GFR 15-29 ml/min (H[facility]) ?  Hypertensive urgency ?  Paroxysmal atrial flutter ([place]) ?  Elevated troponin ?  Tachycardia-bradycardia syndrome ([place]) ?  S/P placement of cardiac pacemaker ?  Acute respiratory failure with hypoxia ([place]) ? ?Acute respiratory failure with hypoxia secondary to diastolic CHF exacerbation: ?-Acute respiratory failure is likely secondary to the diastolic CHF exacerbation. ?-Significant improvement in  respiratory symptoms. ?-Acute on chronic CHF is improving. ?-Continue IV Lasix.   ?-Cardiology input is appreciated. ?-Hypertensive urgency is also improving slowly. ?-Patient is of the BiPAP.   ?  ?Hypertensive urgency ?-According to the patient and patient's daughter, blood pressure usually runs on the high side at home. ?-Coreg has been increased from 12.5 Mg p.o. twice daily to 25 Mg p.o. twice daily. ?-Continue to monitor closely.   ?-Goal blood pressure should be less than 130/80 mmHg if tolerated. ?  ?Elevated troponin  CAD ?Chronic.  Prior history of 3v CABG in 2013. High-sensitivity troponin 31->36.  Similar to previous.  -Suspect secondary to demand. ?-Aspirin, statin, beta-blocker, Imdur ?  ?Right upper quadrant abdominal pain ?CT abdomen/pelvis without contrast revealed: ?"Lower chest: Heart is enlarged in size. Pacer leads are noted in ?place. Small to moderate bilateral pleural effusions are seen. There ?are linear densities in the lower lung fields suggesting ?subsegmental atelectasis. ?  ?Hepatobiliary: Small calcification is seen in the right lobe with no ?significant change. There is no dilation of bile ducts. Gallbladder ?is unremarkable. ?  ?Pancreas: No focal abnormality is seen. ?  ?Spleen: Unremarkable. ?  ?Adrenals/Urinary Tract: Adrenals are not enlarged. There is no ?hydronephrosis. There are multiple smooth marginated cortical ?lesions in both kidneys largest measuring 2 cm. Density measurements ?in many of the lesions is higher than usual for simple cysts. This ?may suggest proteinaceous fluid or hemorrhage in the renal cysts. ?Possibility of solid lesion is not excluded. Similar but smaller ?lesions were seen in the kidneys in the previous study. There are no ?renal or ureteral stones. Urinary bladder is unremarkable. ?  ?Stomach/Bowel: Stomach is unremarkable. Small bowel loops  are not ?dilated. Appendix is not distinctly seen. There is no focal ?pericecal inflammation. Moderate amount  of stool is seen in the ?lumen of right colon. There is no significant wall thickening in ?colon. Scattered diverticula are seen in colon without signs of ?focal acute diverticulitis. Moderate amount of stool is seen in the ?rectum. ?  ?Vascular/Lymphatic: Scattered arterial calcifications are seen in ?the aorta and its major branches. ?  ?Reproductive: Uterus is not seen. ?  ?Other: There is no ascites or pneumoperitoneum. Bilateral inguinal ?hernias containing fat are noted. There is 2.8 cm soft tissue ?density in the subareolar region of left breast. Somewhat similar ?finding was seen in the previous study done on 10/03/2020. Breasts ?are not included in their entirety limiting evaluation. ?  ?Musculoskeletal: There is first-degree anterolisthesis spinal ?stenosis and encroachment of neural foramina at L4-L5 level. ?  ?IMPRESSION: ?There is no evidence of intestinal obstruction or pneumoperitoneum. ?There is no hydronephrosis. ?  ?Diverticulosis of colon without signs of focal diverticulitis. ?  ?There are multiple smooth marginated lesions in the renal cortex in ?both kidneys largest measuring 2 cm in diameter. Density ?measurements in the many of these smooth marginated lesions is ?higher than usual for simple cysts suggesting possible cysts with ?hemorrhage or proteinaceous fluid. Follow-up renal sonogram may be ?considered. ?  ?Small to moderate bilateral pleural effusions, more so on the right ?side. There are linear patchy infiltrates in both lower lung fields ?suggesting atelectasis/pneumonia. ?  ?Lumbar spondylosis particularly severe at L4-L5 level". ?  ? ? ?Chronic kidney disease stage IV ?Patient is followed by Dr. Moshe Cipro of [facility] in the outpatient setting.  Creatinine 1.83 which appears improved from prior. ?-continue to monitor kidney function ?  ?Hypothyroidism ?TSH was 0.135. ?-Check free T4. ?  ?Tachybradycardia syndrome s/p pacemaker ?Followed by Dr. Curt Bears ?   ?Paroxysmal atrial flutter ?Not on anticoagulation due to prior history of GI bleed. ?  ?Diabetes mellitus type 2 ?-Continue subcutaneous Lantus insulin 20 units daily with sliding scale insulin coverage. ?-Continue to monitor closely.   ? ?  ?Hyperlipidemia ?-Continue Crestor ?  ? ? ?DVT prophylaxis: Subcutaneous Lovenox ?Code Status: DO NOT RESUSCITATE ?Family Communication:  ?Disposition Plan:  ? ? ?Consultants:  ?Cardiology ? ?Procedures:  ?None ? ?Antimicrobials:  ?None. ? ? ?Subjective: ?Shortness of breath has improved. ? ?Objective: ?Vitals:  ? 02/10/22 1507 02/10/22 2027 02/11/22 0350 02/11/22 0720  ?BP:  (!) 163/63 (!) 128/47 (!) 152/40  ?Pulse:  66 (!) 59 62  ?Resp:  '20 17 20  '$ ?Temp:  97.9 ?F (36.6 ?C) 98.8 ?F (37.1 ?C) 98.8 ?F (37.1 ?C)  ?TempSrc:  Oral Oral Oral  ?SpO2:  96% 93% 96%  ?Weight: 58.6 kg  56.8 kg   ?Height: 5' (1.524 m)     ? ? ?Intake/Output Summary (Last 24 hours) at 02/11/2022 1027 ?Last data filed at 02/11/2022 0810 ?Gross per 24 hour  ?Intake 363 ml  ?Output 750 ml  ?Net -387 ml  ? ?Filed Weights  ? 02/10/22 0341 02/10/22 1507 02/11/22 0350  ?Weight: 60 kg 58.6 kg 56.8 kg  ? ? ?Examination: ? ?General exam: Appears calm and comfortable. ?Respiratory system: Clear to auscultation. Marland Kitchen ?Cardiovascular system: S1 & S2  ?Gastrointestinal system: Abdomen is obese, soft and nontender.  ?Central nervous system: Alert and oriented.  Patient moves all extremities. ?Extremities: No leg edema. ? ?Data Reviewed: I have personally reviewed following labs and imaging studies ? ?CBC: ?Recent Labs  ?Lab 02/10/22 ?6010  ?WBC  7.8  ?HGB 11.0*  ?HCT 33.9*  ?MCV 102.1*  ?PLT 177  ? ?Basic Metabolic Panel: ?Recent Labs  ?Lab 02/10/22 ?4356 02/11/22 ?8616  ?NA 142 142  ?K 5.0 4.1  ?CL 109 111  ?CO2 26 25  ?GLUCOSE 187* 83  ?BUN 33* 34*  ?CREATININE 1.83* 2.17*  ?CALCIUM 8.9 8.2*  ?MG 2.3  --   ? ?GFR: ?Estimated Creatinine Clearance: 15.8 mL/min (A) (by C-G formula based on SCr of 2.17 mg/dL (H)). ?Liver  Function Tests: ?Recent Labs  ?Lab 02/10/22 ?8372  ?AST 34  ?ALT 17  ?ALKPHOS 75  ?BILITOT 0.6  ?PROT 6.6  ?ALBUMIN 3.6  ? ?Recent Labs  ?Lab 02/10/22 ?9021  ?LIPASE 56*  ? ?No results for input(s): AMMONIA in the last

## 2022-02-11 NOTE — Progress Notes (Signed)
? ?Progress Note ? ?Patient Name: ZORI BENBROOK ?Date of Encounter: 02/11/2022 ? ?[facility] Cardiologist: [name], MD  ? ?Subjective  ? ?Better.  Breathing better.  Still not at baseline. ? ?Inpatient Medications  ?  ?Scheduled Meds: ? allopurinol  200 mg Oral Daily  ? amLODipine  5 mg Oral Daily  ? aspirin EC  81 mg Oral Daily  ? carvedilol  25 mg Oral BID  ? dextrose  25 mL Intravenous Once  ? enoxaparin (LOVENOX) injection  30 mg Subcutaneous Q24H  ? furosemide  40 mg Intravenous BID  ? gabapentin  300 mg Oral QHS  ? insulin aspart  0-6 Units Subcutaneous TID WC  ? insulin glargine-yfgn  20 Units Subcutaneous Daily  ? isosorbide mononitrate  30 mg Oral q AM  ? levothyroxine  100 mcg Oral QAC breakfast  ? pantoprazole  40 mg Oral QAC breakfast  ? senna-docusate  1 tablet Oral BID  ? sertraline  25 mg Oral Daily  ? sodium chloride flush  3 mL Intravenous Q12H  ? ?Continuous Infusions: ? ?PRN Meds: ?acetaminophen **OR** acetaminophen, albuterol, dextrose, diclofenac Sodium, meclizine, ondansetron (ZOFRAN) IV  ? ?Vital Signs  ?  ?Vitals:  ? 02/10/22 1507 02/10/22 2027 02/11/22 0350 02/11/22 0720  ?BP:  (!) 163/63 (!) 128/47 (!) 152/40  ?Pulse:  66 (!) 59 62  ?Resp:  '20 17 20  '$ ?Temp:  97.9 ?F (36.6 ?C) 98.8 ?F (37.1 ?C) 98.8 ?F (37.1 ?C)  ?TempSrc:  Oral Oral Oral  ?SpO2:  96% 93% 96%  ?Weight: 58.6 kg  56.8 kg   ?Height: 5' (1.524 m)     ? ? ?Intake/Output Summary (Last 24 hours) at 02/11/2022 0856 ?Last data filed at 02/11/2022 0810 ?Gross per 24 hour  ?Intake 363 ml  ?Output 750 ml  ?Net -387 ml  ? ? ?  02/11/2022  ?  3:50 AM 02/10/2022  ?  3:07 PM 02/10/2022  ?  3:41 AM  ?Last 3 Weights  ?Weight (lbs) 125 lb 3.2 oz 129 lb 3 oz 132 lb 4.4 oz  ?Weight (kg) 56.79 kg 58.6 kg 60 kg  ?   ? ?Telemetry  ?  ?Sinus- Personally Reviewed ? ?ECG  ?  ?No new- Personally Reviewed ? ?Physical Exam  ? ?GEN: No acute distress.   ?Neck: No JVD ?Cardiac: RRR, no murmurs, rubs, or gallops.  ?Respiratory: Clear to  auscultation bilaterally. ?GI: Soft, nontender, non-distended  ?MS: No edema; No deformity. ?Neuro:  Nonfocal  ?Psych: Normal affect  ? ?Labs  ?  ?High Sensitivity Troponin:   ?Recent Labs  ?Lab 02/10/22 ?8099 02/10/22 ?8338  ?TROPONINIHS 31* 36*  ?   ?Chemistry ?Recent Labs  ?Lab 02/10/22 ?2505 02/10/22 ?3976 02/11/22 ?7341  ?NA 142  --  142  ?K 5.0  --  4.1  ?CL 109  --  111  ?CO2 26  --  25  ?GLUCOSE 187*  --  83  ?BUN 33*  --  34*  ?CREATININE 1.83*  --  2.17*  ?CALCIUM 8.9  --  8.2*  ?MG 2.3  --   --   ?PROT  --  6.6  --   ?ALBUMIN  --  3.6  --   ?AST  --  34  --   ?ALT  --  17  --   ?ALKPHOS  --  75  --   ?BILITOT  --  0.6  --   ?GFRNONAA 27*  --  22*  ?ANIONGAP 7  --  6  ?  ?Lipids No results for input(s): CHOL, TRIG, HDL, LABVLDL, LDLCALC, CHOLHDL in the last 168 hours.  ?Hematology ?Recent Labs  ?Lab 02/10/22 ?1660  ?WBC 7.8  ?RBC 3.32*  ?HGB 11.0*  ?HCT 33.9*  ?MCV 102.1*  ?MCH 33.1  ?MCHC 32.4  ?RDW 14.0  ?PLT 177  ? ?Thyroid  ?Recent Labs  ?Lab 02/11/22 ?6301  ?TSH 0.135*  ?  ?BNP ?Recent Labs  ?Lab 02/10/22 ?6010  ?BNP 2,214.2*  ?  ?DDimer No results for input(s): DDIMER in the last 168 hours.  ? ?Radiology  ?  ?CT ABDOMEN PELVIS WO CONTRAST ? ?Result Date: 02/10/2022 ?CLINICAL DATA:  Abdominal pain, nausea, vomiting EXAM: CT ABDOMEN AND PELVIS WITHOUT CONTRAST TECHNIQUE: Multidetector CT imaging of the abdomen and pelvis was performed following the standard protocol without IV contrast. RADIATION DOSE REDUCTION: This exam was performed according to the departmental dose-optimization program which includes automated exposure control, adjustment of the mA and/or kV according to patient size and/or use of iterative reconstruction technique. COMPARISON:  10/03/2020 FINDINGS: Lower chest: Heart is enlarged in size. Pacer leads are noted in place. Small to moderate bilateral pleural effusions are seen. There are linear densities in the lower lung fields suggesting subsegmental atelectasis. Hepatobiliary:  Small calcification is seen in the right lobe with no significant change. There is no dilation of bile ducts. Gallbladder is unremarkable. Pancreas: No focal abnormality is seen. Spleen: Unremarkable. Adrenals/Urinary Tract: Adrenals are not enlarged. There is no hydronephrosis. There are multiple smooth marginated cortical lesions in both kidneys largest measuring 2 cm. Density measurements in many of the lesions is higher than usual for simple cysts. This may suggest proteinaceous fluid or hemorrhage in the renal cysts. Possibility of solid lesion is not excluded. Similar but smaller lesions were seen in the kidneys in the previous study. There are no renal or ureteral stones. Urinary bladder is unremarkable. Stomach/Bowel: Stomach is unremarkable. Small bowel loops are not dilated. Appendix is not distinctly seen. There is no focal pericecal inflammation. Moderate amount of stool is seen in the lumen of right colon. There is no significant wall thickening in colon. Scattered diverticula are seen in colon without signs of focal acute diverticulitis. Moderate amount of stool is seen in the rectum. Vascular/Lymphatic: Scattered arterial calcifications are seen in the aorta and its major branches. Reproductive: Uterus is not seen. Other: There is no ascites or pneumoperitoneum. Bilateral inguinal hernias containing fat are noted. There is 2.8 cm soft tissue density in the subareolar region of left breast. Somewhat similar finding was seen in the previous study done on 10/03/2020. Breasts are not included in their entirety limiting evaluation. Musculoskeletal: There is first-degree anterolisthesis spinal stenosis and encroachment of neural foramina at L4-L5 level. IMPRESSION: There is no evidence of intestinal obstruction or pneumoperitoneum. There is no hydronephrosis. Diverticulosis of colon without signs of focal diverticulitis. There are multiple smooth marginated lesions in the renal cortex in both kidneys  largest measuring 2 cm in diameter. Density measurements in the many of these smooth marginated lesions is higher than usual for simple cysts suggesting possible cysts with hemorrhage or proteinaceous fluid. Follow-up renal sonogram may be considered. Small to moderate bilateral pleural effusions, more so on the right side. There are linear patchy infiltrates in both lower lung fields suggesting atelectasis/pneumonia. Lumbar spondylosis particularly severe at L4-L5 level. Other findings as described in the body of the report. Electronically Signed   By: Elmer Picker M.D.   On: 02/10/2022 14:31  ? ?DG  Chest Portable 1 View ? ?Result Date: 02/10/2022 ?CLINICAL DATA:  Dyspnea EXAM: PORTABLE CHEST 1 VIEW COMPARISON:  01/07/2022 FINDINGS: The lungs are symmetrically well expanded. No pneumothorax or pleural effusion. There is perihilar and basilar interstitial pulmonary infiltrate present in keeping with mild interstitial pulmonary edema, progressive since prior examination. Coronary artery bypass grafting has been performed. Cardiac size is within normal limits. Left subclavian dual lead pacemaker is unchanged. Remote fracture deformity of the left humeral head noted. IMPRESSION: Mild, progressive interstitial pulmonary edema, likely cardiogenic in nature. Electronically Signed   By: Fidela Salisbury M.D.   On: 02/10/2022 04:16   ? ?Cardiac Studies  ? ?EF 55% ? ?Patient Profile  ?   ?81 y.o. female with coronary disease status post CABG chronic kidney disease stage IV here with acute diastolic heart failure. ? ?Assessment & Plan  ?  ?Acute diastolic heart failure ?- Felt much better after Lasix therapy ?-Originally required BiPAP.  BNP 2214.  Chest x-ray with edema personally reviewed.  She has been compliant with medications.  Continue to work on diet especially canned foods. ?-Continue with IV Lasix 40 mg twice a day for 1 more day.  At home, we will likely transition her to torsemide 20 mg twice a day as she was  on 20 mg once a day only. ?-Carvedilol was increased to 25 twice a day during this admission. ? ?Coronary artery disease ?- Doing very well post bypass.  Troponins are minimally elevated flat trend secondary to myocardial

## 2022-02-11 NOTE — TOC Progression Note (Signed)
Transition of Care (TOC) - Progression Note  ? ? ?Patient Details  ?Name: [name] ?MRN: [id number] ?Date of Birth: 10-24-1941 ? ?Transition of Care (TOC) CM/SW Contact  ?Zenon Mayo, RN ?Phone Number: ?02/11/2022, 1:25 PM ? ?Clinical Narrative:    ? ?from home, a/c Chf , diuresing, iv lasix 40 she is active with [facility] for [facility], [place], [place]. Will need resumption orders. ? ?  ?  ? ?Expected Discharge Plan and Services ?  ?  ?  ?  ?  ?                ?  ?  ?  ?  ?  ?  ?  ?  ?  ?  ? ? ?Social Determinants of Health (SDOH) Interventions ?  ? ?Readmission Risk Interventions ?   ? View : No data to display.  ?  ?  ?  ? ? ?

## 2022-02-11 NOTE — Plan of Care (Signed)

## 2022-02-12 DIAGNOSIS — I5033 Acute on chronic diastolic (congestive) heart failure: Secondary | ICD-10-CM | POA: Diagnosis not present

## 2022-02-12 LAB — RENAL FUNCTION PANEL
Albumin: 2.8 g/dL — ABNORMAL LOW (ref 3.5–5.0)
Anion gap: 8 (ref 5–15)
BUN: 38 mg/dL — ABNORMAL HIGH (ref 8–23)
CO2: 24 mmol/L (ref 22–32)
Calcium: 8.1 mg/dL — ABNORMAL LOW (ref 8.9–10.3)
Chloride: 107 mmol/L (ref 98–111)
Creatinine, Ser: 2.47 mg/dL — ABNORMAL HIGH (ref 0.44–1.00)
GFR, Estimated: 19 mL/min — ABNORMAL LOW (ref >60)
Glucose, Bld: 147 mg/dL — ABNORMAL HIGH (ref 70–99)
Phosphorus: 4.9 mg/dL — ABNORMAL HIGH (ref 2.5–4.6)
Potassium: 4.3 mmol/L (ref 3.5–5.1)
Sodium: 139 mmol/L (ref 135–145)

## 2022-02-12 LAB — GLUCOSE, CAPILLARY
Glucose-Capillary: 150 mg/dL — ABNORMAL HIGH (ref 70–99)
Glucose-Capillary: 130 mg/dL — ABNORMAL HIGH (ref 70–99)
Glucose-Capillary: 174 mg/dL — ABNORMAL HIGH (ref 70–99)

## 2022-02-12 LAB — MAGNESIUM: Magnesium: 1.9 mg/dL (ref 1.7–2.4)

## 2022-02-12 MED ORDER — LANTUS 100 UNIT/ML ~~LOC~~ SOLN: 20.0000 [IU] | Freq: Every day | SUBCUTANEOUS | 1 refills | Status: DC

## 2022-02-12 MED ORDER — POLYETHYLENE GLYCOL 3350 17 G PO PACK
17.0000 g | PACK | Freq: Every day | ORAL | 0 refills | Status: DC
Start: 2022-02-13 — End: 2023-10-28

## 2022-02-12 MED ORDER — CARVEDILOL 25 MG PO TABS: 25.0000 mg | ORAL_TABLET | Freq: Two times a day (BID) | ORAL | 0 refills | Status: DC

## 2022-02-12 NOTE — Discharge Summary (Signed)
?Physician Discharge Summary ?  ?Patient: [name] MRN: [id number] DOB: January 02, 1941  ?Admit date:     02/10/2022  ?Discharge date: 02/12/22  ?Discharge Physician: Bonnell Public  ? ?PCP: Leeroy Cha, MD  ? ?Recommendations at discharge:  ? ?Follow-up with primary care provider, cardiology and nephrology team within 1 week of discharge. ?Continue to monitor patient's weights closely. ? ?Discharge Diagnoses: ?Principal Problem: ?  Acute on chronic diastolic CHF (congestive heart failure) ([place]) ?Active Problems: ?  Acute respiratory failure with hypoxia ([place]) ?  S/P CABG (coronary artery bypass graft), 12/04/11 ?  S/P placement of cardiac pacemaker ?  Insulin dependent type 2 diabetes mellitus ([place]) ?  Paroxysmal atrial flutter ([place]) ?  Hypothyroidism ?  CKD (chronic kidney disease) stage 4, GFR 15-29 ml/min (H[facility]) ?  Hypertensive urgency ?  Elevated troponin ?  Tachycardia-bradycardia syndrome ([place]) ? ?Resolved Problems: ?  * No resolved hospital problems. * ? ?Hospital Course: ?Patient is an 81 year old female with past medical history significant for hypertension, hyperlipidemia, diastolic congestive heart failure with EF of 55 to 26%, grade 2 diastolic dysfunction, coronary artery disease status post three-vessel CABG, diabetes mellitus type 2 and chronic kidney disease stage IV.  Patient was admitted with acute on chronic diastolic CHF (with exacerbation) and hypertensive urgency (blood pressure of 219/73 mmHg).  On presentation, BUN was 33 with serum creatinine of 1.83, BNP of 2214 and troponin of 31-36 (None rising).  Patient was initially on BiPAP.  Cardiology input is appreciated.  Patient is improving with diuresis.  Patient is not back to baseline. ? ?02/11/2022: Patient seen alongside patient's daughter.  Patient's nurse was also present during the presentation.  Patient is slowly improving.  Cardiology input is appreciated.  Volume overload is improving.  Increased abdominal girth is trending  towards normal.  No leg edema.  No fever or chills.  No chest pain.  No nausea or vomiting.  No urinary symptoms. ? ? ?Assessment and Plan: ?Acute respiratory failure with hypoxia secondary to diastolic CHF exacerbation: ?-Acute respiratory failure is likely secondary to the diastolic CHF exacerbation. ?-Significant improvement in respiratory symptoms. ?-Acute on chronic CHF is improving. ?-Continue IV Lasix.   ?-Cardiology input is appreciated. ?-Hypertensive urgency is also improving slowly. ?-Patient is of the BiPAP.   ?  ?Hypertensive urgency ?-According to the patient and patient's daughter, blood pressure usually runs on the high side at home. ?-Coreg has been increased from 12.5 Mg p.o. twice daily to 25 Mg p.o. twice daily. ?-Continue to monitor closely.   ?-Goal blood pressure should be less than 130/80 mmHg if tolerated. ?  ?Elevated troponin  CAD ?Chronic.  Prior history of 3v CABG in 2013. High-sensitivity troponin 31->36.  Similar to previous.  -Suspect secondary to demand. ?-Aspirin, statin, beta-blocker, Imdur ?  ?Right upper quadrant abdominal pain ?CT abdomen/pelvis without contrast revealed: ?"Lower chest: Heart is enlarged in size. Pacer leads are noted in ?place. Small to moderate bilateral pleural effusions are seen. There ?are linear densities in the lower lung fields suggesting ?subsegmental atelectasis. ?  ?Hepatobiliary: Small calcification is seen in the right lobe with no ?significant change. There is no dilation of bile ducts. Gallbladder ?is unremarkable. ?  ?Pancreas: No focal abnormality is seen. ?  ?Spleen: Unremarkable. ?  ?Adrenals/Urinary Tract: Adrenals are not enlarged. There is no ?hydronephrosis. There are multiple smooth marginated cortical ?lesions in both kidneys largest measuring 2 cm. Density measurements ?in many of the lesions is higher than usual for simple cysts. This ?  may suggest proteinaceous fluid or hemorrhage in the renal cysts. ?Possibility of solid lesion is  not excluded. Similar but smaller ?lesions were seen in the kidneys in the previous study. There are no ?renal or ureteral stones. Urinary bladder is unremarkable. ?  ?Stomach/Bowel: Stomach is unremarkable. Small bowel loops are not ?dilated. Appendix is not distinctly seen. There is no focal ?pericecal inflammation. Moderate amount of stool is seen in the ?lumen of right colon. There is no significant wall thickening in ?colon. Scattered diverticula are seen in colon without signs of ?focal acute diverticulitis. Moderate amount of stool is seen in the ?rectum. ?  ?Vascular/Lymphatic: Scattered arterial calcifications are seen in ?the aorta and its major branches. ?  ?Reproductive: Uterus is not seen. ?  ?Other: There is no ascites or pneumoperitoneum. Bilateral inguinal ?hernias containing fat are noted. There is 2.8 cm soft tissue ?density in the subareolar region of left breast. Somewhat similar ?finding was seen in the previous study done on 10/03/2020. Breasts ?are not included in their entirety limiting evaluation. ?  ?Musculoskeletal: There is first-degree anterolisthesis spinal ?stenosis and encroachment of neural foramina at L4-L5 level. ?  ?IMPRESSION: ?There is no evidence of intestinal obstruction or pneumoperitoneum. ?There is no hydronephrosis. ?  ?Diverticulosis of colon without signs of focal diverticulitis. ?  ?There are multiple smooth marginated lesions in the renal cortex in ?both kidneys largest measuring 2 cm in diameter. Density ?measurements in the many of these smooth marginated lesions is ?higher than usual for simple cysts suggesting possible cysts with ?hemorrhage or proteinaceous fluid. Follow-up renal sonogram may be ?considered. ?  ?Small to moderate bilateral pleural effusions, more so on the right ?side. There are linear patchy infiltrates in both lower lung fields ?suggesting atelectasis/pneumonia. ?  ?Lumbar spondylosis particularly severe at L4-L5 level". ?  ?Chronic kidney  disease stage IV ?Patient is followed by Dr. Moshe Cipro of [facility] in the outpatient setting.  Creatinine 1.83 which appears improved from prior. ?-continue to monitor kidney function ?  ?Hypothyroidism ?TSH was 0.135. ?-Check free T4. ?  ?Tachybradycardia syndrome s/p pacemaker ?Followed by Dr. Curt Bears ?  ?Paroxysmal atrial flutter ?Not on anticoagulation due to prior history of GI bleed. ?  ?Diabetes mellitus type 2 ?-Continue subcutaneous Lantus insulin 20 units daily with sliding scale insulin coverage. ?-Continue to monitor closely.   ?  ?  ?Hyperlipidemia ?-Continue Crestor ? ? ?Consultants: Cardiology ?Procedures performed: None ?Disposition: Home ?Diet recommendation:  ?Discharge Diet Orders (From admission, onward)  ? ?  Start     Ordered  ? 02/12/22 0000  Diet - low sodium heart healthy       ? 02/12/22 1417  ? ?  ?  ? ?  ? ?Renal diet ?DISCHARGE MEDICATION: ?Allergies as of 02/12/2022   ? ?   Reactions  ? Clonidine Derivatives Other (See Comments)  ? Bradycardia and fatigue  ? Clonidine Hcl Other (See Comments)  ? Bradycardia  ? Crestor [rosuvastatin] Other (See Comments)  ? Made the patient feel tired/weak  ? Losartan Potassium Other (See Comments)  ? Hyperkalemia  ? Sulfa Antibiotics Other (See Comments)  ? Childhood reaction not recalled  ? Epinephrine Other (See Comments)  ? Abnormal feeling. Dental exam/injection of local w/ epi.  ? Hydralazine Hcl Anxiety, Other (See Comments)  ? Nervousness, anxiousness, GI upset  ? ?  ? ?  ?Medication List  ?  ? ?STOP taking these medications   ? ?Lantus SoloStar 100 UNIT/ML Solostar Pen ?Generic drug: insulin  glargine ?Replaced by: Lantus 100 UNIT/ML injection ?  ?ondansetron 4 MG disintegrating tablet ?Commonly known as: ZOFRAN-ODT ?  ? ?  ? ?TAKE these medications   ? ?acetaminophen 650 MG CR tablet ?Commonly known as: TYLENOL ?Take 650 mg by mouth every 8 (eight) hours as needed for pain. ?  ?allopurinol 100 MG tablet ?Commonly known as:  ZYLOPRIM ?Take 200 mg by mouth daily. ?  ?amLODipine 5 MG tablet ?Commonly known as: NORVASC ?Take 1 tablet (5 mg total) by mouth daily. ?  ?aspirin EC 81 MG tablet ?Take 1 tablet (81 mg total) by mouth daily.

## 2022-02-12 NOTE — Care Management Important Message (Signed)
Important Message ? ?Patient Details  ?Name: [name] ?MRN: [id number] ?Date of Birth: [birth year] ? ? ?Medicare Important Message Given:  Yes ? ? ? ? ?Shelda Altes ?02/12/2022, 9:29 AM ?

## 2022-02-12 NOTE — Progress Notes (Signed)
? ?Progress Note ? ?Patient Name: [name] ?Date of Encounter: 02/12/2022 ? ?[facility] Cardiologist: [name], MD  ? ?Subjective  ? ?Doing well this morning.  Feels back to baseline.  Sitting comfortably in chair.  Daughter at bedside.  Questions answered. ? ?Inpatient Medications  ?  ?Scheduled Meds: ? allopurinol  200 mg Oral Daily  ? amLODipine  5 mg Oral Daily  ? aspirin EC  81 mg Oral Daily  ? carvedilol  25 mg Oral BID  ? dextrose  25 mL Intravenous Once  ? enoxaparin (LOVENOX) injection  30 mg Subcutaneous Q24H  ? gabapentin  300 mg Oral QHS  ? insulin aspart  0-6 Units Subcutaneous TID WC  ? insulin glargine-yfgn  20 Units Subcutaneous Daily  ? isosorbide mononitrate  30 mg Oral q AM  ? levothyroxine  100 mcg Oral QAC breakfast  ? pantoprazole  40 mg Oral QAC breakfast  ? polyethylene glycol  17 g Oral Daily  ? senna-docusate  1 tablet Oral BID  ? sertraline  25 mg Oral Daily  ? sodium chloride flush  3 mL Intravenous Q12H  ? ?Continuous Infusions: ? ?PRN Meds: ?acetaminophen **OR** acetaminophen, albuterol, dextrose, diclofenac Sodium, meclizine, melatonin, ondansetron (ZOFRAN) IV  ? ?Vital Signs  ?  ?Vitals:  ? 02/11/22 0350 02/11/22 0720 02/11/22 1955 02/12/22 0358  ?BP: (!) 128/47 (!) 152/40 (!) 153/61 136/74  ?Pulse: (!) 59 62 62 66  ?Resp: '17 20 15 14  '$ ?Temp: 98.8 ?F (37.1 ?C) 98.8 ?F (37.1 ?C) 98.3 ?F (36.8 ?C) 98.4 ?F (36.9 ?C)  ?TempSrc: Oral Oral Oral Oral  ?SpO2: 93% 96% 96% 96%  ?Weight: 56.8 kg   57.4 kg  ?Height:      ? ? ?Intake/Output Summary (Last 24 hours) at 02/12/2022 1032 ?Last data filed at 02/12/2022 0800 ?Gross per 24 hour  ?Intake 1250 ml  ?Output 750 ml  ?Net 500 ml  ? ? ?  02/12/2022  ?  3:58 AM 02/11/2022  ?  3:50 AM 02/10/2022  ?  3:07 PM  ?Last 3 Weights  ?Weight (lbs) 126 lb 9.6 oz 125 lb 3.2 oz 129 lb 3 oz  ?Weight (kg) 57.425 kg 56.79 kg 58.6 kg  ?   ? ?Telemetry  ?  ?Sinus- Personally Reviewed ? ?ECG  ?  ?No new- Personally Reviewed ? ?Physical Exam  ? ?GEN: No  acute distress.   ?Neck: No JVD ?Cardiac: RRR, no murmurs, rubs, or gallops.  ?Respiratory: Clear to auscultation bilaterally. ?GI: Soft, nontender, non-distended  ?MS: No edema; No deformity. ?Neuro:  Nonfocal  ?Psych: Normal affect  ? ?Labs  ?  ?High Sensitivity Troponin:   ?Recent Labs  ?Lab 02/10/22 ?1245 02/10/22 ?8099  ?TROPONINIHS 31* 36*  ?   ?Chemistry ?Recent Labs  ?Lab 02/10/22 ?8338 02/10/22 ?0649 02/11/22 ?0427 02/12/22 ?0337  ?NA 142  --  142 139  ?K 5.0  --  4.1 4.3  ?CL 109  --  111 107  ?CO2 26  --  25 24  ?GLUCOSE 187*  --  83 147*  ?BUN 33*  --  34* 38*  ?CREATININE 1.83*  --  2.17* 2.47*  ?CALCIUM 8.9  --  8.2* 8.1*  ?MG 2.3  --   --  1.9  ?PROT  --  6.6  --   --   ?ALBUMIN  --  3.6  --  2.8*  ?AST  --  34  --   --   ?ALT  --  17  --   --   ?ALKPHOS  --  75  --   --   ?BILITOT  --  0.6  --   --   ?GFRNONAA 27*  --  22* 19*  ?ANIONGAP 7  --  6 8  ?  ?Lipids No results for input(s): CHOL, TRIG, HDL, LABVLDL, LDLCALC, CHOLHDL in the last 168 hours.  ?Hematology ?Recent Labs  ?Lab 02/10/22 ?6144  ?WBC 7.8  ?RBC 3.32*  ?HGB 11.0*  ?HCT 33.9*  ?MCV 102.1*  ?MCH 33.1  ?MCHC 32.4  ?RDW 14.0  ?PLT 177  ? ?Thyroid  ?Recent Labs  ?Lab 02/11/22 ?3154  ?TSH 0.135*  ?FREET4 1.50*  ?  ?BNP ?Recent Labs  ?Lab 02/10/22 ?0086  ?BNP 2,214.2*  ?  ?DDimer No results for input(s): DDIMER in the last 168 hours.  ? ?Radiology  ?  ?CT ABDOMEN PELVIS WO CONTRAST ? ?Result Date: 02/10/2022 ?CLINICAL DATA:  Abdominal pain, nausea, vomiting EXAM: CT ABDOMEN AND PELVIS WITHOUT CONTRAST TECHNIQUE: Multidetector CT imaging of the abdomen and pelvis was performed following the standard protocol without IV contrast. RADIATION DOSE REDUCTION: This exam was performed according to the departmental dose-optimization program which includes automated exposure control, adjustment of the mA and/or kV according to patient size and/or use of iterative reconstruction technique. COMPARISON:  10/03/2020 FINDINGS: Lower chest: Heart is enlarged  in size. Pacer leads are noted in place. Small to moderate bilateral pleural effusions are seen. There are linear densities in the lower lung fields suggesting subsegmental atelectasis. Hepatobiliary: Small calcification is seen in the right lobe with no significant change. There is no dilation of bile ducts. Gallbladder is unremarkable. Pancreas: No focal abnormality is seen. Spleen: Unremarkable. Adrenals/Urinary Tract: Adrenals are not enlarged. There is no hydronephrosis. There are multiple smooth marginated cortical lesions in both kidneys largest measuring 2 cm. Density measurements in many of the lesions is higher than usual for simple cysts. This may suggest proteinaceous fluid or hemorrhage in the renal cysts. Possibility of solid lesion is not excluded. Similar but smaller lesions were seen in the kidneys in the previous study. There are no renal or ureteral stones. Urinary bladder is unremarkable. Stomach/Bowel: Stomach is unremarkable. Small bowel loops are not dilated. Appendix is not distinctly seen. There is no focal pericecal inflammation. Moderate amount of stool is seen in the lumen of right colon. There is no significant wall thickening in colon. Scattered diverticula are seen in colon without signs of focal acute diverticulitis. Moderate amount of stool is seen in the rectum. Vascular/Lymphatic: Scattered arterial calcifications are seen in the aorta and its major branches. Reproductive: Uterus is not seen. Other: There is no ascites or pneumoperitoneum. Bilateral inguinal hernias containing fat are noted. There is 2.8 cm soft tissue density in the subareolar region of left breast. Somewhat similar finding was seen in the previous study done on 10/03/2020. Breasts are not included in their entirety limiting evaluation. Musculoskeletal: There is first-degree anterolisthesis spinal stenosis and encroachment of neural foramina at L4-L5 level. IMPRESSION: There is no evidence of intestinal  obstruction or pneumoperitoneum. There is no hydronephrosis. Diverticulosis of colon without signs of focal diverticulitis. There are multiple smooth marginated lesions in the renal cortex in both kidneys largest measuring 2 cm in diameter. Density measurements in the many of these smooth marginated lesions is higher than usual for simple cysts suggesting possible cysts with hemorrhage or proteinaceous fluid. Follow-up renal sonogram may be considered. Small to moderate bilateral pleural effusions, more so on  the right side. There are linear patchy infiltrates in both lower lung fields suggesting atelectasis/pneumonia. Lumbar spondylosis particularly severe at L4-L5 level. Other findings as described in the body of the report. Electronically Signed   By: Elmer Picker M.D.   On: 02/10/2022 14:31   ? ?Cardiac Studies  ? ?EF 55% ? ?Patient Profile  ?   ?81 y.o. female with coronary disease status post CABG chronic kidney disease stage IV here with acute diastolic heart failure. ? ?Assessment & Plan  ?  ?Acute diastolic heart failure ?- Felt much better after IV Lasix therapy ?-Originally required BiPAP.  BNP 2214.  Chest x-ray with edema personally reviewed.  She has been compliant with medications.  Continue to work on diet especially canned foods. ?-Her creatinine is increasing.  We will go ahead and stop her Lasix. ?-At home, we will transition her to torsemide 20 mg twice a day as she was on 20 mg once a day only. ?-Carvedilol was increased to 25 twice a day during this admission. ? ?Coronary artery disease ?- Doing very well post bypass.  Troponins are minimally elevated flat trend secondary to myocardial injury in the setting of heart failure exacerbation. ?- Continue with aspirin statin beta-blocker isosorbide ? ?Hypertensive urgency ?- Much improved.  Carvedilol was increased as above.  Continue with amlodipine 5 mg a day.  No changes made ? ?Hyperlipidemia ?- On Crestor 10 mg a day.  LDL goal less than  70. ? ?Tachybradycardia syndrome with pacemaker ?- Excellent function.  Dr. Curt Bears ? ?Chronic kidney disease stage IV ?- Prior readings have been 2-2.5.  Was 1.83 on admission.  Likely indicative of volume o

## 2022-02-12 NOTE — Progress Notes (Signed)
[facility] night cross cover note: ? ?I was notified by RN of patient's request for sleep aid.  I subsequently placed order for prn melatonin. ? ? ? ?Babs Bertin, DO ?Hospitalist ? ?

## 2022-02-12 NOTE — TOC Transition Note (Signed)
Transition of Care (TOC) - CM/SW Discharge Note ? ? ?Patient Details  ?Name: [name] ?MRN: [id number] ?Date of Birth: January 18, 1941 ? ?Transition of Care (TOC) CM/SW Contact:  ?Zenon Mayo, RN ?Phone Number: ?02/12/2022, 4:30 PM ? ? ?Clinical Narrative:    ?Patient is for dc today, NCM notified Tillie Rung with [facility], they said it will be Tuesday before they can resume her services. NCM will let patient know.  ? ? ?Final next level of care: [place] ?Barriers to Discharge: Continued Medical Work up ? ? ?Patient Goals and CMS Choice ?Patient states their goals for this hospitalization and ongoing recovery are:: return home ?CMS Medicare.gov Compare Post Acute Care list provided to:: Patient ?Choice offered to / list presented to : Patient ? ?Discharge Placement ?  ?           ?  ?  ?  ?  ? ?Discharge Plan and Services ?  ?Discharge Planning Services: CM Consult ?Post Acute Care Choice: Home Health          ?  ?DME Agency: NA ?  ?  ?  ?HH Arranged: RN, PT, OT ?[place] Agency: [place] ?Date HH Agency Contacted: 02/11/22 ?Time [place]: 2536 ?Representative spoke with at [place]: Jeanette Caprice ? ?Social Determinants of Health (SDOH) Interventions ?  ? ? ?Readmission Risk Interventions ? ?  02/11/2022  ?  4:50 PM  ?Readmission Risk Prevention Plan  ?Transportation Screening Complete  ?PCP or Specialist Appt within 3-5 Days Complete  ?[place] or Home Care Consult Complete  ?Social Work Consult for [place] Planning/Counseling Complete  ?Palliative Care Screening Not Applicable  ?Medication Review Press photographer) Complete  ? ? ? ? ? ?

## 2022-02-12 NOTE — Progress Notes (Signed)
Nursing Dc note ? ?Patient alert and oriented, verbalized understanding of dc instructions. Ccmd notified. Daughter [name] informed of dc order and instructions and meds. Patient will be waiting in the dc lounge. Dtr to call once she arrives. Alll belongings and paperwork given to patient. ?

## 2022-02-15 DIAGNOSIS — E875 Hyperkalemia: Secondary | ICD-10-CM | POA: Diagnosis not present

## 2022-02-15 DIAGNOSIS — N179 Acute kidney failure, unspecified: Secondary | ICD-10-CM | POA: Diagnosis not present

## 2022-02-15 DIAGNOSIS — I129 Hypertensive chronic kidney disease with stage 1 through stage 4 chronic kidney disease, or unspecified chronic kidney disease: Secondary | ICD-10-CM | POA: Diagnosis not present

## 2022-02-15 DIAGNOSIS — D631 Anemia in chronic kidney disease: Secondary | ICD-10-CM | POA: Diagnosis not present

## 2022-02-15 DIAGNOSIS — N183 Chronic kidney disease, stage 3 unspecified: Secondary | ICD-10-CM | POA: Diagnosis not present

## 2022-02-16 DIAGNOSIS — J9601 Acute respiratory failure with hypoxia: Secondary | ICD-10-CM | POA: Diagnosis not present

## 2022-02-16 DIAGNOSIS — I352 Nonrheumatic aortic (valve) stenosis with insufficiency: Secondary | ICD-10-CM | POA: Diagnosis not present

## 2022-02-16 DIAGNOSIS — I495 Sick sinus syndrome: Secondary | ICD-10-CM | POA: Diagnosis not present

## 2022-02-16 DIAGNOSIS — E039 Hypothyroidism, unspecified: Secondary | ICD-10-CM | POA: Diagnosis not present

## 2022-02-16 DIAGNOSIS — I2510 Atherosclerotic heart disease of native coronary artery without angina pectoris: Secondary | ICD-10-CM | POA: Diagnosis not present

## 2022-02-16 DIAGNOSIS — D638 Anemia in other chronic diseases classified elsewhere: Secondary | ICD-10-CM | POA: Diagnosis not present

## 2022-02-16 DIAGNOSIS — Z950 Presence of cardiac pacemaker: Secondary | ICD-10-CM | POA: Diagnosis not present

## 2022-02-16 DIAGNOSIS — Z7982 Long term (current) use of aspirin: Secondary | ICD-10-CM | POA: Diagnosis not present

## 2022-02-16 DIAGNOSIS — Z794 Long term (current) use of insulin: Secondary | ICD-10-CM | POA: Diagnosis not present

## 2022-02-16 DIAGNOSIS — E1122 Type 2 diabetes mellitus with diabetic chronic kidney disease: Secondary | ICD-10-CM | POA: Diagnosis not present

## 2022-02-16 DIAGNOSIS — Z951 Presence of aortocoronary bypass graft: Secondary | ICD-10-CM | POA: Diagnosis not present

## 2022-02-16 DIAGNOSIS — M170 Bilateral primary osteoarthritis of knee: Secondary | ICD-10-CM | POA: Diagnosis not present

## 2022-02-16 DIAGNOSIS — Z95818 Presence of other cardiac implants and grafts: Secondary | ICD-10-CM | POA: Diagnosis not present

## 2022-02-16 DIAGNOSIS — E1169 Type 2 diabetes mellitus with other specified complication: Secondary | ICD-10-CM | POA: Diagnosis not present

## 2022-02-16 DIAGNOSIS — I130 Hypertensive heart and chronic kidney disease with heart failure and stage 1 through stage 4 chronic kidney disease, or unspecified chronic kidney disease: Secondary | ICD-10-CM | POA: Diagnosis not present

## 2022-02-16 DIAGNOSIS — I5033 Acute on chronic diastolic (congestive) heart failure: Secondary | ICD-10-CM | POA: Diagnosis not present

## 2022-02-16 DIAGNOSIS — I480 Paroxysmal atrial fibrillation: Secondary | ICD-10-CM | POA: Diagnosis not present

## 2022-02-16 DIAGNOSIS — K5790 Diverticulosis of intestine, part unspecified, without perforation or abscess without bleeding: Secondary | ICD-10-CM | POA: Diagnosis not present

## 2022-02-16 DIAGNOSIS — N184 Chronic kidney disease, stage 4 (severe): Secondary | ICD-10-CM | POA: Diagnosis not present

## 2022-02-16 DIAGNOSIS — I517 Cardiomegaly: Secondary | ICD-10-CM | POA: Diagnosis not present

## 2022-02-16 DIAGNOSIS — E785 Hyperlipidemia, unspecified: Secondary | ICD-10-CM | POA: Diagnosis not present

## 2022-02-17 ENCOUNTER — Ambulatory Visit (INDEPENDENT_AMBULATORY_CARE_PROVIDER_SITE_OTHER): Payer: Medicare Other

## 2022-02-17 DIAGNOSIS — E785 Hyperlipidemia, unspecified: Secondary | ICD-10-CM | POA: Diagnosis not present

## 2022-02-17 DIAGNOSIS — E1122 Type 2 diabetes mellitus with diabetic chronic kidney disease: Secondary | ICD-10-CM | POA: Diagnosis not present

## 2022-02-17 DIAGNOSIS — J9601 Acute respiratory failure with hypoxia: Secondary | ICD-10-CM | POA: Diagnosis not present

## 2022-02-17 DIAGNOSIS — I130 Hypertensive heart and chronic kidney disease with heart failure and stage 1 through stage 4 chronic kidney disease, or unspecified chronic kidney disease: Secondary | ICD-10-CM | POA: Diagnosis not present

## 2022-02-17 DIAGNOSIS — I5033 Acute on chronic diastolic (congestive) heart failure: Secondary | ICD-10-CM | POA: Diagnosis not present

## 2022-02-17 DIAGNOSIS — I495 Sick sinus syndrome: Secondary | ICD-10-CM

## 2022-02-17 DIAGNOSIS — I517 Cardiomegaly: Secondary | ICD-10-CM | POA: Diagnosis not present

## 2022-02-17 DIAGNOSIS — E039 Hypothyroidism, unspecified: Secondary | ICD-10-CM | POA: Diagnosis not present

## 2022-02-17 DIAGNOSIS — D638 Anemia in other chronic diseases classified elsewhere: Secondary | ICD-10-CM | POA: Diagnosis not present

## 2022-02-17 DIAGNOSIS — E1169 Type 2 diabetes mellitus with other specified complication: Secondary | ICD-10-CM | POA: Diagnosis not present

## 2022-02-17 DIAGNOSIS — N184 Chronic kidney disease, stage 4 (severe): Secondary | ICD-10-CM | POA: Diagnosis not present

## 2022-02-17 DIAGNOSIS — Z951 Presence of aortocoronary bypass graft: Secondary | ICD-10-CM | POA: Diagnosis not present

## 2022-02-17 DIAGNOSIS — Z7982 Long term (current) use of aspirin: Secondary | ICD-10-CM | POA: Diagnosis not present

## 2022-02-17 DIAGNOSIS — Z950 Presence of cardiac pacemaker: Secondary | ICD-10-CM | POA: Diagnosis not present

## 2022-02-17 DIAGNOSIS — K5790 Diverticulosis of intestine, part unspecified, without perforation or abscess without bleeding: Secondary | ICD-10-CM | POA: Diagnosis not present

## 2022-02-17 DIAGNOSIS — M170 Bilateral primary osteoarthritis of knee: Secondary | ICD-10-CM | POA: Diagnosis not present

## 2022-02-17 DIAGNOSIS — I2510 Atherosclerotic heart disease of native coronary artery without angina pectoris: Secondary | ICD-10-CM | POA: Diagnosis not present

## 2022-02-17 DIAGNOSIS — Z794 Long term (current) use of insulin: Secondary | ICD-10-CM | POA: Diagnosis not present

## 2022-02-17 DIAGNOSIS — I480 Paroxysmal atrial fibrillation: Secondary | ICD-10-CM | POA: Diagnosis not present

## 2022-02-17 DIAGNOSIS — I352 Nonrheumatic aortic (valve) stenosis with insufficiency: Secondary | ICD-10-CM | POA: Diagnosis not present

## 2022-02-17 DIAGNOSIS — Z95818 Presence of other cardiac implants and grafts: Secondary | ICD-10-CM | POA: Diagnosis not present

## 2022-02-17 LAB — CUP PACEART REMOTE DEVICE CHECK
Battery Remaining Longevity: 57 mo
Battery Remaining Percentage: 49 %
Battery Voltage: 2.98 V
Brady Statistic AP VP Percent: 1 %
Brady Statistic AP VS Percent: 99 %
Brady Statistic AS VP Percent: 1 %
Brady Statistic AS VS Percent: 1 %
Brady Statistic RA Percent Paced: 99 %
Brady Statistic RV Percent Paced: 1 %
Date Time Interrogation Session: 20230426020015
Implantable Lead Implant Date: 20171019
Implantable Lead Implant Date: 20171019
Implantable Lead Location: 753859
Implantable Lead Location: 753860
Implantable Pulse Generator Implant Date: 20171019
Lead Channel Impedance Value: 390 Ohm
Lead Channel Impedance Value: 640 Ohm
Lead Channel Pacing Threshold Amplitude: 0.5 V
Lead Channel Pacing Threshold Amplitude: 0.75 V
Lead Channel Pacing Threshold Pulse Width: 0.4 ms
Lead Channel Pacing Threshold Pulse Width: 0.4 ms
Lead Channel Sensing Intrinsic Amplitude: 12 mV
Lead Channel Sensing Intrinsic Amplitude: 3 mV
Lead Channel Setting Pacing Amplitude: 2 V
Lead Channel Setting Pacing Amplitude: 2.5 V
Lead Channel Setting Pacing Pulse Width: 0.4 ms
Lead Channel Setting Sensing Sensitivity: 2 mV
Pulse Gen Model: 2272
Pulse Gen Serial Number: 3180458

## 2022-02-22 DIAGNOSIS — Z794 Long term (current) use of insulin: Secondary | ICD-10-CM | POA: Diagnosis not present

## 2022-02-22 DIAGNOSIS — I130 Hypertensive heart and chronic kidney disease with heart failure and stage 1 through stage 4 chronic kidney disease, or unspecified chronic kidney disease: Secondary | ICD-10-CM | POA: Diagnosis not present

## 2022-02-22 DIAGNOSIS — I495 Sick sinus syndrome: Secondary | ICD-10-CM | POA: Diagnosis not present

## 2022-02-22 DIAGNOSIS — I352 Nonrheumatic aortic (valve) stenosis with insufficiency: Secondary | ICD-10-CM | POA: Diagnosis not present

## 2022-02-22 DIAGNOSIS — J9601 Acute respiratory failure with hypoxia: Secondary | ICD-10-CM | POA: Diagnosis not present

## 2022-02-22 DIAGNOSIS — M170 Bilateral primary osteoarthritis of knee: Secondary | ICD-10-CM | POA: Diagnosis not present

## 2022-02-22 DIAGNOSIS — Z951 Presence of aortocoronary bypass graft: Secondary | ICD-10-CM | POA: Diagnosis not present

## 2022-02-22 DIAGNOSIS — Z7982 Long term (current) use of aspirin: Secondary | ICD-10-CM | POA: Diagnosis not present

## 2022-02-22 DIAGNOSIS — E785 Hyperlipidemia, unspecified: Secondary | ICD-10-CM | POA: Diagnosis not present

## 2022-02-22 DIAGNOSIS — I517 Cardiomegaly: Secondary | ICD-10-CM | POA: Diagnosis not present

## 2022-02-22 DIAGNOSIS — Z950 Presence of cardiac pacemaker: Secondary | ICD-10-CM | POA: Diagnosis not present

## 2022-02-22 DIAGNOSIS — E1122 Type 2 diabetes mellitus with diabetic chronic kidney disease: Secondary | ICD-10-CM | POA: Diagnosis not present

## 2022-02-22 DIAGNOSIS — E1169 Type 2 diabetes mellitus with other specified complication: Secondary | ICD-10-CM | POA: Diagnosis not present

## 2022-02-22 DIAGNOSIS — I5033 Acute on chronic diastolic (congestive) heart failure: Secondary | ICD-10-CM | POA: Diagnosis not present

## 2022-02-22 DIAGNOSIS — I480 Paroxysmal atrial fibrillation: Secondary | ICD-10-CM | POA: Diagnosis not present

## 2022-02-22 DIAGNOSIS — Z95818 Presence of other cardiac implants and grafts: Secondary | ICD-10-CM | POA: Diagnosis not present

## 2022-02-22 DIAGNOSIS — K5790 Diverticulosis of intestine, part unspecified, without perforation or abscess without bleeding: Secondary | ICD-10-CM | POA: Diagnosis not present

## 2022-02-22 DIAGNOSIS — I2510 Atherosclerotic heart disease of native coronary artery without angina pectoris: Secondary | ICD-10-CM | POA: Diagnosis not present

## 2022-02-22 DIAGNOSIS — N184 Chronic kidney disease, stage 4 (severe): Secondary | ICD-10-CM | POA: Diagnosis not present

## 2022-02-22 DIAGNOSIS — E039 Hypothyroidism, unspecified: Secondary | ICD-10-CM | POA: Diagnosis not present

## 2022-02-22 DIAGNOSIS — D638 Anemia in other chronic diseases classified elsewhere: Secondary | ICD-10-CM | POA: Diagnosis not present

## 2022-02-23 DIAGNOSIS — I130 Hypertensive heart and chronic kidney disease with heart failure and stage 1 through stage 4 chronic kidney disease, or unspecified chronic kidney disease: Secondary | ICD-10-CM | POA: Diagnosis not present

## 2022-02-23 DIAGNOSIS — I517 Cardiomegaly: Secondary | ICD-10-CM | POA: Diagnosis not present

## 2022-02-23 DIAGNOSIS — Z794 Long term (current) use of insulin: Secondary | ICD-10-CM | POA: Diagnosis not present

## 2022-02-23 DIAGNOSIS — I2510 Atherosclerotic heart disease of native coronary artery without angina pectoris: Secondary | ICD-10-CM | POA: Diagnosis not present

## 2022-02-23 DIAGNOSIS — I480 Paroxysmal atrial fibrillation: Secondary | ICD-10-CM | POA: Diagnosis not present

## 2022-02-23 DIAGNOSIS — Z7982 Long term (current) use of aspirin: Secondary | ICD-10-CM | POA: Diagnosis not present

## 2022-02-23 DIAGNOSIS — E1169 Type 2 diabetes mellitus with other specified complication: Secondary | ICD-10-CM | POA: Diagnosis not present

## 2022-02-23 DIAGNOSIS — Z951 Presence of aortocoronary bypass graft: Secondary | ICD-10-CM | POA: Diagnosis not present

## 2022-02-23 DIAGNOSIS — N184 Chronic kidney disease, stage 4 (severe): Secondary | ICD-10-CM | POA: Diagnosis not present

## 2022-02-23 DIAGNOSIS — J9601 Acute respiratory failure with hypoxia: Secondary | ICD-10-CM | POA: Diagnosis not present

## 2022-02-23 DIAGNOSIS — E785 Hyperlipidemia, unspecified: Secondary | ICD-10-CM | POA: Diagnosis not present

## 2022-02-23 DIAGNOSIS — M170 Bilateral primary osteoarthritis of knee: Secondary | ICD-10-CM | POA: Diagnosis not present

## 2022-02-23 DIAGNOSIS — I495 Sick sinus syndrome: Secondary | ICD-10-CM | POA: Diagnosis not present

## 2022-02-23 DIAGNOSIS — E1122 Type 2 diabetes mellitus with diabetic chronic kidney disease: Secondary | ICD-10-CM | POA: Diagnosis not present

## 2022-02-23 DIAGNOSIS — I352 Nonrheumatic aortic (valve) stenosis with insufficiency: Secondary | ICD-10-CM | POA: Diagnosis not present

## 2022-02-23 DIAGNOSIS — E039 Hypothyroidism, unspecified: Secondary | ICD-10-CM | POA: Diagnosis not present

## 2022-02-23 DIAGNOSIS — K5790 Diverticulosis of intestine, part unspecified, without perforation or abscess without bleeding: Secondary | ICD-10-CM | POA: Diagnosis not present

## 2022-02-23 DIAGNOSIS — D638 Anemia in other chronic diseases classified elsewhere: Secondary | ICD-10-CM | POA: Diagnosis not present

## 2022-02-23 DIAGNOSIS — Z950 Presence of cardiac pacemaker: Secondary | ICD-10-CM | POA: Diagnosis not present

## 2022-02-23 DIAGNOSIS — I5033 Acute on chronic diastolic (congestive) heart failure: Secondary | ICD-10-CM | POA: Diagnosis not present

## 2022-02-23 DIAGNOSIS — Z95818 Presence of other cardiac implants and grafts: Secondary | ICD-10-CM | POA: Diagnosis not present

## 2022-03-01 ENCOUNTER — Other Ambulatory Visit: Payer: Self-pay

## 2022-03-01 MED ORDER — ISOSORBIDE MONONITRATE ER 30 MG PO TB24: 30.0000 mg | ORAL_TABLET | Freq: Every day | ORAL | 10 refills | Status: DC

## 2022-03-02 DIAGNOSIS — Z95818 Presence of other cardiac implants and grafts: Secondary | ICD-10-CM | POA: Diagnosis not present

## 2022-03-02 DIAGNOSIS — E1169 Type 2 diabetes mellitus with other specified complication: Secondary | ICD-10-CM | POA: Diagnosis not present

## 2022-03-02 DIAGNOSIS — D638 Anemia in other chronic diseases classified elsewhere: Secondary | ICD-10-CM | POA: Diagnosis not present

## 2022-03-02 DIAGNOSIS — I495 Sick sinus syndrome: Secondary | ICD-10-CM | POA: Diagnosis not present

## 2022-03-02 DIAGNOSIS — I480 Paroxysmal atrial fibrillation: Secondary | ICD-10-CM | POA: Diagnosis not present

## 2022-03-02 DIAGNOSIS — J9601 Acute respiratory failure with hypoxia: Secondary | ICD-10-CM | POA: Diagnosis not present

## 2022-03-02 DIAGNOSIS — M170 Bilateral primary osteoarthritis of knee: Secondary | ICD-10-CM | POA: Diagnosis not present

## 2022-03-02 DIAGNOSIS — K5790 Diverticulosis of intestine, part unspecified, without perforation or abscess without bleeding: Secondary | ICD-10-CM | POA: Diagnosis not present

## 2022-03-02 DIAGNOSIS — E1122 Type 2 diabetes mellitus with diabetic chronic kidney disease: Secondary | ICD-10-CM | POA: Diagnosis not present

## 2022-03-02 DIAGNOSIS — I517 Cardiomegaly: Secondary | ICD-10-CM | POA: Diagnosis not present

## 2022-03-02 DIAGNOSIS — E785 Hyperlipidemia, unspecified: Secondary | ICD-10-CM | POA: Diagnosis not present

## 2022-03-02 DIAGNOSIS — E039 Hypothyroidism, unspecified: Secondary | ICD-10-CM | POA: Diagnosis not present

## 2022-03-02 DIAGNOSIS — I130 Hypertensive heart and chronic kidney disease with heart failure and stage 1 through stage 4 chronic kidney disease, or unspecified chronic kidney disease: Secondary | ICD-10-CM | POA: Diagnosis not present

## 2022-03-02 DIAGNOSIS — N184 Chronic kidney disease, stage 4 (severe): Secondary | ICD-10-CM | POA: Diagnosis not present

## 2022-03-02 DIAGNOSIS — I352 Nonrheumatic aortic (valve) stenosis with insufficiency: Secondary | ICD-10-CM | POA: Diagnosis not present

## 2022-03-02 DIAGNOSIS — Z794 Long term (current) use of insulin: Secondary | ICD-10-CM | POA: Diagnosis not present

## 2022-03-02 DIAGNOSIS — I5033 Acute on chronic diastolic (congestive) heart failure: Secondary | ICD-10-CM | POA: Diagnosis not present

## 2022-03-02 DIAGNOSIS — I2510 Atherosclerotic heart disease of native coronary artery without angina pectoris: Secondary | ICD-10-CM | POA: Diagnosis not present

## 2022-03-02 DIAGNOSIS — Z7982 Long term (current) use of aspirin: Secondary | ICD-10-CM | POA: Diagnosis not present

## 2022-03-02 DIAGNOSIS — Z951 Presence of aortocoronary bypass graft: Secondary | ICD-10-CM | POA: Diagnosis not present

## 2022-03-02 DIAGNOSIS — Z950 Presence of cardiac pacemaker: Secondary | ICD-10-CM | POA: Diagnosis not present

## 2022-03-03 DIAGNOSIS — D638 Anemia in other chronic diseases classified elsewhere: Secondary | ICD-10-CM | POA: Diagnosis not present

## 2022-03-03 DIAGNOSIS — I495 Sick sinus syndrome: Secondary | ICD-10-CM | POA: Diagnosis not present

## 2022-03-03 DIAGNOSIS — E1122 Type 2 diabetes mellitus with diabetic chronic kidney disease: Secondary | ICD-10-CM | POA: Diagnosis not present

## 2022-03-03 DIAGNOSIS — Z95818 Presence of other cardiac implants and grafts: Secondary | ICD-10-CM | POA: Diagnosis not present

## 2022-03-03 DIAGNOSIS — I517 Cardiomegaly: Secondary | ICD-10-CM | POA: Diagnosis not present

## 2022-03-03 DIAGNOSIS — E785 Hyperlipidemia, unspecified: Secondary | ICD-10-CM | POA: Diagnosis not present

## 2022-03-03 DIAGNOSIS — I2510 Atherosclerotic heart disease of native coronary artery without angina pectoris: Secondary | ICD-10-CM | POA: Diagnosis not present

## 2022-03-03 DIAGNOSIS — Z950 Presence of cardiac pacemaker: Secondary | ICD-10-CM | POA: Diagnosis not present

## 2022-03-03 DIAGNOSIS — I5033 Acute on chronic diastolic (congestive) heart failure: Secondary | ICD-10-CM | POA: Diagnosis not present

## 2022-03-03 DIAGNOSIS — E039 Hypothyroidism, unspecified: Secondary | ICD-10-CM | POA: Diagnosis not present

## 2022-03-03 DIAGNOSIS — Z951 Presence of aortocoronary bypass graft: Secondary | ICD-10-CM | POA: Diagnosis not present

## 2022-03-03 DIAGNOSIS — M170 Bilateral primary osteoarthritis of knee: Secondary | ICD-10-CM | POA: Diagnosis not present

## 2022-03-03 DIAGNOSIS — K5790 Diverticulosis of intestine, part unspecified, without perforation or abscess without bleeding: Secondary | ICD-10-CM | POA: Diagnosis not present

## 2022-03-03 DIAGNOSIS — E1169 Type 2 diabetes mellitus with other specified complication: Secondary | ICD-10-CM | POA: Diagnosis not present

## 2022-03-03 DIAGNOSIS — Z794 Long term (current) use of insulin: Secondary | ICD-10-CM | POA: Diagnosis not present

## 2022-03-03 DIAGNOSIS — I352 Nonrheumatic aortic (valve) stenosis with insufficiency: Secondary | ICD-10-CM | POA: Diagnosis not present

## 2022-03-03 DIAGNOSIS — N184 Chronic kidney disease, stage 4 (severe): Secondary | ICD-10-CM | POA: Diagnosis not present

## 2022-03-03 DIAGNOSIS — J9601 Acute respiratory failure with hypoxia: Secondary | ICD-10-CM | POA: Diagnosis not present

## 2022-03-03 DIAGNOSIS — Z7982 Long term (current) use of aspirin: Secondary | ICD-10-CM | POA: Diagnosis not present

## 2022-03-03 DIAGNOSIS — I130 Hypertensive heart and chronic kidney disease with heart failure and stage 1 through stage 4 chronic kidney disease, or unspecified chronic kidney disease: Secondary | ICD-10-CM | POA: Diagnosis not present

## 2022-03-03 DIAGNOSIS — I480 Paroxysmal atrial fibrillation: Secondary | ICD-10-CM | POA: Diagnosis not present

## 2022-03-04 NOTE — Progress Notes (Signed)
Remote pacemaker transmission.   

## 2022-03-08 DIAGNOSIS — K5790 Diverticulosis of intestine, part unspecified, without perforation or abscess without bleeding: Secondary | ICD-10-CM | POA: Diagnosis not present

## 2022-03-08 DIAGNOSIS — Z7982 Long term (current) use of aspirin: Secondary | ICD-10-CM | POA: Diagnosis not present

## 2022-03-08 DIAGNOSIS — J9601 Acute respiratory failure with hypoxia: Secondary | ICD-10-CM | POA: Diagnosis not present

## 2022-03-08 DIAGNOSIS — E039 Hypothyroidism, unspecified: Secondary | ICD-10-CM | POA: Diagnosis not present

## 2022-03-08 DIAGNOSIS — Z950 Presence of cardiac pacemaker: Secondary | ICD-10-CM | POA: Diagnosis not present

## 2022-03-08 DIAGNOSIS — I517 Cardiomegaly: Secondary | ICD-10-CM | POA: Diagnosis not present

## 2022-03-08 DIAGNOSIS — E1122 Type 2 diabetes mellitus with diabetic chronic kidney disease: Secondary | ICD-10-CM | POA: Diagnosis not present

## 2022-03-08 DIAGNOSIS — E785 Hyperlipidemia, unspecified: Secondary | ICD-10-CM | POA: Diagnosis not present

## 2022-03-08 DIAGNOSIS — I495 Sick sinus syndrome: Secondary | ICD-10-CM | POA: Diagnosis not present

## 2022-03-08 DIAGNOSIS — I2510 Atherosclerotic heart disease of native coronary artery without angina pectoris: Secondary | ICD-10-CM | POA: Diagnosis not present

## 2022-03-08 DIAGNOSIS — I352 Nonrheumatic aortic (valve) stenosis with insufficiency: Secondary | ICD-10-CM | POA: Diagnosis not present

## 2022-03-08 DIAGNOSIS — E1169 Type 2 diabetes mellitus with other specified complication: Secondary | ICD-10-CM | POA: Diagnosis not present

## 2022-03-08 DIAGNOSIS — N184 Chronic kidney disease, stage 4 (severe): Secondary | ICD-10-CM | POA: Diagnosis not present

## 2022-03-08 DIAGNOSIS — Z95818 Presence of other cardiac implants and grafts: Secondary | ICD-10-CM | POA: Diagnosis not present

## 2022-03-08 DIAGNOSIS — Z951 Presence of aortocoronary bypass graft: Secondary | ICD-10-CM | POA: Diagnosis not present

## 2022-03-08 DIAGNOSIS — I480 Paroxysmal atrial fibrillation: Secondary | ICD-10-CM | POA: Diagnosis not present

## 2022-03-08 DIAGNOSIS — M170 Bilateral primary osteoarthritis of knee: Secondary | ICD-10-CM | POA: Diagnosis not present

## 2022-03-08 DIAGNOSIS — Z794 Long term (current) use of insulin: Secondary | ICD-10-CM | POA: Diagnosis not present

## 2022-03-08 DIAGNOSIS — I130 Hypertensive heart and chronic kidney disease with heart failure and stage 1 through stage 4 chronic kidney disease, or unspecified chronic kidney disease: Secondary | ICD-10-CM | POA: Diagnosis not present

## 2022-03-08 DIAGNOSIS — I5033 Acute on chronic diastolic (congestive) heart failure: Secondary | ICD-10-CM | POA: Diagnosis not present

## 2022-03-08 DIAGNOSIS — D638 Anemia in other chronic diseases classified elsewhere: Secondary | ICD-10-CM | POA: Diagnosis not present

## 2022-03-11 DIAGNOSIS — K5790 Diverticulosis of intestine, part unspecified, without perforation or abscess without bleeding: Secondary | ICD-10-CM | POA: Diagnosis not present

## 2022-03-11 DIAGNOSIS — I130 Hypertensive heart and chronic kidney disease with heart failure and stage 1 through stage 4 chronic kidney disease, or unspecified chronic kidney disease: Secondary | ICD-10-CM | POA: Diagnosis not present

## 2022-03-11 DIAGNOSIS — E1122 Type 2 diabetes mellitus with diabetic chronic kidney disease: Secondary | ICD-10-CM | POA: Diagnosis not present

## 2022-03-11 DIAGNOSIS — I517 Cardiomegaly: Secondary | ICD-10-CM | POA: Diagnosis not present

## 2022-03-11 DIAGNOSIS — E785 Hyperlipidemia, unspecified: Secondary | ICD-10-CM | POA: Diagnosis not present

## 2022-03-11 DIAGNOSIS — I480 Paroxysmal atrial fibrillation: Secondary | ICD-10-CM | POA: Diagnosis not present

## 2022-03-11 DIAGNOSIS — J9601 Acute respiratory failure with hypoxia: Secondary | ICD-10-CM | POA: Diagnosis not present

## 2022-03-11 DIAGNOSIS — E039 Hypothyroidism, unspecified: Secondary | ICD-10-CM | POA: Diagnosis not present

## 2022-03-11 DIAGNOSIS — I5033 Acute on chronic diastolic (congestive) heart failure: Secondary | ICD-10-CM | POA: Diagnosis not present

## 2022-03-11 DIAGNOSIS — I495 Sick sinus syndrome: Secondary | ICD-10-CM | POA: Diagnosis not present

## 2022-03-11 DIAGNOSIS — Z951 Presence of aortocoronary bypass graft: Secondary | ICD-10-CM | POA: Diagnosis not present

## 2022-03-11 DIAGNOSIS — I352 Nonrheumatic aortic (valve) stenosis with insufficiency: Secondary | ICD-10-CM | POA: Diagnosis not present

## 2022-03-11 DIAGNOSIS — Z7982 Long term (current) use of aspirin: Secondary | ICD-10-CM | POA: Diagnosis not present

## 2022-03-11 DIAGNOSIS — Z95818 Presence of other cardiac implants and grafts: Secondary | ICD-10-CM | POA: Diagnosis not present

## 2022-03-11 DIAGNOSIS — Z950 Presence of cardiac pacemaker: Secondary | ICD-10-CM | POA: Diagnosis not present

## 2022-03-11 DIAGNOSIS — D638 Anemia in other chronic diseases classified elsewhere: Secondary | ICD-10-CM | POA: Diagnosis not present

## 2022-03-11 DIAGNOSIS — E1169 Type 2 diabetes mellitus with other specified complication: Secondary | ICD-10-CM | POA: Diagnosis not present

## 2022-03-11 DIAGNOSIS — M170 Bilateral primary osteoarthritis of knee: Secondary | ICD-10-CM | POA: Diagnosis not present

## 2022-03-11 DIAGNOSIS — I2510 Atherosclerotic heart disease of native coronary artery without angina pectoris: Secondary | ICD-10-CM | POA: Diagnosis not present

## 2022-03-11 DIAGNOSIS — N184 Chronic kidney disease, stage 4 (severe): Secondary | ICD-10-CM | POA: Diagnosis not present

## 2022-03-11 DIAGNOSIS — Z794 Long term (current) use of insulin: Secondary | ICD-10-CM | POA: Diagnosis not present

## 2022-03-17 ENCOUNTER — Other Ambulatory Visit: Payer: Self-pay

## 2022-03-17 ENCOUNTER — Inpatient Hospital Stay (HOSPITAL_COMMUNITY)
Admission: EM | Admit: 2022-03-17 | Discharge: 2022-03-23 | DRG: 481 | Disposition: A | Discharge status: Skilled Nursing Facility | Payer: Medicare Other | Attending: Internal Medicine | Admitting: Internal Medicine

## 2022-03-17 DIAGNOSIS — I2510 Atherosclerotic heart disease of native coronary artery without angina pectoris: Secondary | ICD-10-CM | POA: Diagnosis present

## 2022-03-17 DIAGNOSIS — Z743 Need for continuous supervision: Secondary | ICD-10-CM | POA: Diagnosis not present

## 2022-03-17 DIAGNOSIS — N184 Chronic kidney disease, stage 4 (severe): Secondary | ICD-10-CM | POA: Diagnosis present

## 2022-03-17 DIAGNOSIS — R6889 Other general symptoms and signs: Secondary | ICD-10-CM | POA: Diagnosis not present

## 2022-03-17 DIAGNOSIS — S0990XA Unspecified injury of head, initial encounter: Secondary | ICD-10-CM | POA: Diagnosis not present

## 2022-03-17 DIAGNOSIS — Z951 Presence of aortocoronary bypass graft: Secondary | ICD-10-CM | POA: Diagnosis not present

## 2022-03-17 DIAGNOSIS — E1165 Type 2 diabetes mellitus with hyperglycemia: Secondary | ICD-10-CM | POA: Diagnosis present

## 2022-03-17 DIAGNOSIS — D649 Anemia, unspecified: Secondary | ICD-10-CM | POA: Diagnosis not present

## 2022-03-17 DIAGNOSIS — E039 Hypothyroidism, unspecified: Secondary | ICD-10-CM | POA: Diagnosis present

## 2022-03-17 DIAGNOSIS — D62 Acute posthemorrhagic anemia: Secondary | ICD-10-CM | POA: Diagnosis not present

## 2022-03-17 DIAGNOSIS — Z950 Presence of cardiac pacemaker: Secondary | ICD-10-CM

## 2022-03-17 DIAGNOSIS — E1169 Type 2 diabetes mellitus with other specified complication: Secondary | ICD-10-CM | POA: Diagnosis not present

## 2022-03-17 DIAGNOSIS — I10 Essential (primary) hypertension: Secondary | ICD-10-CM | POA: Diagnosis not present

## 2022-03-17 DIAGNOSIS — H9190 Unspecified hearing loss, unspecified ear: Secondary | ICD-10-CM | POA: Diagnosis not present

## 2022-03-17 DIAGNOSIS — Z96652 Presence of left artificial knee joint: Secondary | ICD-10-CM | POA: Diagnosis present

## 2022-03-17 DIAGNOSIS — S72142D Displaced intertrochanteric fracture of left femur, subsequent encounter for closed fracture with routine healing: Secondary | ICD-10-CM | POA: Diagnosis not present

## 2022-03-17 DIAGNOSIS — N281 Cyst of kidney, acquired: Secondary | ICD-10-CM | POA: Diagnosis not present

## 2022-03-17 DIAGNOSIS — E119 Type 2 diabetes mellitus without complications: Secondary | ICD-10-CM

## 2022-03-17 DIAGNOSIS — I252 Old myocardial infarction: Secondary | ICD-10-CM

## 2022-03-17 DIAGNOSIS — Z794 Long term (current) use of insulin: Secondary | ICD-10-CM | POA: Diagnosis not present

## 2022-03-17 DIAGNOSIS — Z6824 Body mass index (BMI) 24.0-24.9, adult: Secondary | ICD-10-CM

## 2022-03-17 DIAGNOSIS — Z833 Family history of diabetes mellitus: Secondary | ICD-10-CM

## 2022-03-17 DIAGNOSIS — M21922 Unspecified acquired deformity of left upper arm: Secondary | ICD-10-CM | POA: Diagnosis not present

## 2022-03-17 DIAGNOSIS — R279 Unspecified lack of coordination: Secondary | ICD-10-CM | POA: Diagnosis not present

## 2022-03-17 DIAGNOSIS — Z66 Do not resuscitate: Secondary | ICD-10-CM | POA: Diagnosis present

## 2022-03-17 DIAGNOSIS — K219 Gastro-esophageal reflux disease without esophagitis: Secondary | ICD-10-CM | POA: Diagnosis present

## 2022-03-17 DIAGNOSIS — F419 Anxiety disorder, unspecified: Secondary | ICD-10-CM | POA: Diagnosis present

## 2022-03-17 DIAGNOSIS — N183 Chronic kidney disease, stage 3 unspecified: Secondary | ICD-10-CM | POA: Diagnosis not present

## 2022-03-17 DIAGNOSIS — I5032 Chronic diastolic (congestive) heart failure: Secondary | ICD-10-CM | POA: Diagnosis present

## 2022-03-17 DIAGNOSIS — K2100 Gastro-esophageal reflux disease with esophagitis, without bleeding: Secondary | ICD-10-CM | POA: Diagnosis not present

## 2022-03-17 DIAGNOSIS — D696 Thrombocytopenia, unspecified: Secondary | ICD-10-CM | POA: Diagnosis not present

## 2022-03-17 DIAGNOSIS — I495 Sick sinus syndrome: Secondary | ICD-10-CM | POA: Diagnosis present

## 2022-03-17 DIAGNOSIS — N179 Acute kidney failure, unspecified: Secondary | ICD-10-CM | POA: Diagnosis not present

## 2022-03-17 DIAGNOSIS — W19XXXD Unspecified fall, subsequent encounter: Secondary | ICD-10-CM | POA: Diagnosis not present

## 2022-03-17 DIAGNOSIS — E1042 Type 1 diabetes mellitus with diabetic polyneuropathy: Secondary | ICD-10-CM | POA: Diagnosis present

## 2022-03-17 DIAGNOSIS — Z8249 Family history of ischemic heart disease and other diseases of the circulatory system: Secondary | ICD-10-CM

## 2022-03-17 DIAGNOSIS — R2689 Other abnormalities of gait and mobility: Secondary | ICD-10-CM | POA: Diagnosis not present

## 2022-03-17 DIAGNOSIS — N189 Chronic kidney disease, unspecified: Secondary | ICD-10-CM | POA: Diagnosis not present

## 2022-03-17 DIAGNOSIS — E871 Hypo-osmolality and hyponatremia: Secondary | ICD-10-CM | POA: Diagnosis not present

## 2022-03-17 DIAGNOSIS — Z955 Presence of coronary angioplasty implant and graft: Secondary | ICD-10-CM

## 2022-03-17 DIAGNOSIS — R262 Difficulty in walking, not elsewhere classified: Secondary | ICD-10-CM | POA: Diagnosis not present

## 2022-03-17 DIAGNOSIS — E875 Hyperkalemia: Secondary | ICD-10-CM | POA: Diagnosis not present

## 2022-03-17 DIAGNOSIS — S72142A Displaced intertrochanteric fracture of left femur, initial encounter for closed fracture: Secondary | ICD-10-CM | POA: Diagnosis not present

## 2022-03-17 DIAGNOSIS — M810 Age-related osteoporosis without current pathological fracture: Secondary | ICD-10-CM | POA: Diagnosis not present

## 2022-03-17 DIAGNOSIS — Z79890 Hormone replacement therapy (postmenopausal): Secondary | ICD-10-CM

## 2022-03-17 DIAGNOSIS — D631 Anemia in chronic kidney disease: Secondary | ICD-10-CM | POA: Diagnosis present

## 2022-03-17 DIAGNOSIS — M80852A Other osteoporosis with current pathological fracture, left femur, initial encounter for fracture: Secondary | ICD-10-CM | POA: Diagnosis not present

## 2022-03-17 DIAGNOSIS — F32A Depression, unspecified: Secondary | ICD-10-CM | POA: Diagnosis not present

## 2022-03-17 DIAGNOSIS — Z961 Presence of intraocular lens: Secondary | ICD-10-CM | POA: Diagnosis present

## 2022-03-17 DIAGNOSIS — M6281 Muscle weakness (generalized): Secondary | ICD-10-CM | POA: Diagnosis not present

## 2022-03-17 DIAGNOSIS — Z888 Allergy status to other drugs, medicaments and biological substances status: Secondary | ICD-10-CM

## 2022-03-17 DIAGNOSIS — G8911 Acute pain due to trauma: Secondary | ICD-10-CM | POA: Diagnosis not present

## 2022-03-17 DIAGNOSIS — I152 Hypertension secondary to endocrine disorders: Secondary | ICD-10-CM | POA: Diagnosis present

## 2022-03-17 DIAGNOSIS — I480 Paroxysmal atrial fibrillation: Secondary | ICD-10-CM | POA: Diagnosis not present

## 2022-03-17 DIAGNOSIS — E1122 Type 2 diabetes mellitus with diabetic chronic kidney disease: Secondary | ICD-10-CM | POA: Diagnosis not present

## 2022-03-17 DIAGNOSIS — S72352A Displaced comminuted fracture of shaft of left femur, initial encounter for closed fracture: Principal | ICD-10-CM

## 2022-03-17 DIAGNOSIS — E785 Hyperlipidemia, unspecified: Secondary | ICD-10-CM | POA: Diagnosis present

## 2022-03-17 DIAGNOSIS — Z043 Encounter for examination and observation following other accident: Secondary | ICD-10-CM | POA: Diagnosis not present

## 2022-03-17 DIAGNOSIS — E440 Moderate protein-calorie malnutrition: Secondary | ICD-10-CM | POA: Diagnosis present

## 2022-03-17 DIAGNOSIS — G3184 Mild cognitive impairment, so stated: Secondary | ICD-10-CM | POA: Diagnosis present

## 2022-03-17 DIAGNOSIS — M80052A Age-related osteoporosis with current pathological fracture, left femur, initial encounter for fracture: Secondary | ICD-10-CM | POA: Diagnosis not present

## 2022-03-17 DIAGNOSIS — Z90710 Acquired absence of both cervix and uterus: Secondary | ICD-10-CM

## 2022-03-17 DIAGNOSIS — I130 Hypertensive heart and chronic kidney disease with heart failure and stage 1 through stage 4 chronic kidney disease, or unspecified chronic kidney disease: Secondary | ICD-10-CM | POA: Diagnosis not present

## 2022-03-17 DIAGNOSIS — M25552 Pain in left hip: Secondary | ICD-10-CM | POA: Diagnosis not present

## 2022-03-17 DIAGNOSIS — E1140 Type 2 diabetes mellitus with diabetic neuropathy, unspecified: Secondary | ICD-10-CM | POA: Diagnosis not present

## 2022-03-17 DIAGNOSIS — S199XXA Unspecified injury of neck, initial encounter: Secondary | ICD-10-CM | POA: Diagnosis not present

## 2022-03-17 DIAGNOSIS — Z85828 Personal history of other malignant neoplasm of skin: Secondary | ICD-10-CM

## 2022-03-17 DIAGNOSIS — W010XXA Fall on same level from slipping, tripping and stumbling without subsequent striking against object, initial encounter: Secondary | ICD-10-CM | POA: Diagnosis present

## 2022-03-17 DIAGNOSIS — Z882 Allergy status to sulfonamides status: Secondary | ICD-10-CM

## 2022-03-17 DIAGNOSIS — W19XXXA Unspecified fall, initial encounter: Secondary | ICD-10-CM | POA: Diagnosis not present

## 2022-03-17 DIAGNOSIS — I129 Hypertensive chronic kidney disease with stage 1 through stage 4 chronic kidney disease, or unspecified chronic kidney disease: Secondary | ICD-10-CM | POA: Diagnosis not present

## 2022-03-17 DIAGNOSIS — F418 Other specified anxiety disorders: Secondary | ICD-10-CM | POA: Diagnosis not present

## 2022-03-17 DIAGNOSIS — E1159 Type 2 diabetes mellitus with other circulatory complications: Secondary | ICD-10-CM | POA: Diagnosis not present

## 2022-03-17 DIAGNOSIS — Z79899 Other long term (current) drug therapy: Secondary | ICD-10-CM

## 2022-03-17 DIAGNOSIS — M1990 Unspecified osteoarthritis, unspecified site: Secondary | ICD-10-CM | POA: Diagnosis present

## 2022-03-17 DIAGNOSIS — Z7982 Long term (current) use of aspirin: Secondary | ICD-10-CM

## 2022-03-17 DIAGNOSIS — Z9889 Other specified postprocedural states: Secondary | ICD-10-CM | POA: Diagnosis not present

## 2022-03-17 MED ORDER — FENTANYL CITRATE PF 50 MCG/ML IJ SOSY
12.5000 ug | PREFILLED_SYRINGE | Freq: Once | INTRAMUSCULAR | Status: AC
  Administered 2022-03-18: 12.5 ug via INTRAVENOUS
  Filled 2022-03-17: qty 1

## 2022-03-17 NOTE — ED Notes (Signed)
ED Provider at bedside. 

## 2022-03-17 NOTE — ED Provider Notes (Signed)
[facility]PART[facility] Provider Note   CSN: [id number] Arrival date & time: 03/17/22  2327     History  Chief Complaint  Patient presents with   Fall    Patient arrives via EMS with complaints of left hip pain after a fall. Patient was getting ready for bed and fell while trying to put on her clothes. No deformity noted but shortening to left lower extremity. Patient also states her left shoulder hurts and when she fell she did hit the back of her head. Patient does not take blood thinners. GCS 15 on arrival     [name] is a 81 y.o. female.  With past medical history of GERD, CAD, pacemaker placement, diastolic heart failure, CKD stage III, hypertension, type 2 diabetes who presents to the emergency department with fall.  Patient states that this evening she was getting ready for bed when she fell.  She describes it as a trip and fall but is unable to tell me what she tripped on.  There is a note from EMS that she was trying to put on her clothes.  States that she normally uses a cane but did not have it at that time.  She states that she bent over and then fell backward onto her left side.  She endorses hitting the back of her head but is unsure if she lost consciousness.  She states she does not think so.  Not anticoagulated.  She denies any chest pain, palpitations, shortness of breath or dizziness prior to or after the fall.  She complains of pain to her left hip and left arm.   Fall      Home Medications Prior to Admission medications   Medication Sig Start Date End Date Taking? Authorizing Provider  acetaminophen (TYLENOL) 650 MG CR tablet Take 650 mg by mouth every 8 (eight) hours as needed for pain.    [provider]  allopurinol (ZYLOPRIM) 100 MG tablet Take 200 mg by mouth daily.    [provider]  amLODipine (NORVASC) 5 MG tablet Take 1 tablet (5 mg total) by mouth daily. 09/14/21 02/10/22  [name], MD  aspirin  EC 81 MG tablet Take 1 tablet (81 mg total) by mouth daily. 01/12/22   [place] Donnamarie Poag, DO  carvedilol (COREG) 25 MG tablet Take 1 tablet (25 mg total) by mouth 2 (two) times daily. 02/12/22 03/14/22  Dana Allan I, MD  diclofenac Sodium (VOLTAREN) 1 % GEL Apply 2 g topically 2 (two) times daily as needed (pain).    [provider]  docusate sodium (COLACE) 100 MG capsule Take 100 mg by mouth in the morning and at bedtime.    [provider]  FEROSUL 325 (65 Fe) MG tablet Take 325 mg by mouth 2 (two) times a week. No set days 04/21/21   [provider]  gabapentin (NEURONTIN) 300 MG capsule Take 1 capsule (300 mg total) by mouth at bedtime. 05/22/19   Roney Jaffe, MD  insulin glargine (LANTUS) 100 UNIT/ML injection Inject 0.2 mLs (20 Units total) into the skin daily. 02/13/22 05/24/22  Dana Allan I, MD  isosorbide mononitrate (IMDUR) 30 MG 24 hr tablet Take 1 tablet (30 mg total) by mouth daily. 03/01/22   Hilty, Nadean Corwin, MD  meclizine (ANTIVERT) 25 MG tablet Take 25 mg by mouth daily as needed for dizziness.    [provider]  nitroGLYCERIN (NITROSTAT) 0.4 MG SL tablet Place 1 tablet (0.4 mg total)  under the tongue every 5 (five) minutes as needed for chest pain (max 3 doses). 02/11/20 02/10/22  Hilty, Nadean Corwin, MD  pantoprazole (PROTONIX) 40 MG tablet Take 40 mg by mouth daily before breakfast. 06/24/20   [provider]  Polyethyl Glycol-Propyl Glycol (SYSTANE OP) Place 1 drop into both eyes 2 (two) times daily as needed (dry eyes).    [provider]  polyethylene glycol (MIRALAX / GLYCOLAX) 17 g packet Take 17 g by mouth daily. 02/13/22   Dana Allan I, MD  rosuvastatin (CRESTOR) 10 MG tablet Take 10 mg by mouth at bedtime. 01/28/21   [provider]  sertraline (ZOLOFT) 25 MG tablet Take 25 mg by mouth daily. 02/08/22   [provider]  SYNTHROID 100 MCG tablet Take 100 mcg by mouth at bedtime. 03/26/21   [provider]  torsemide (DEMADEX) 20 MG tablet Take 2 tablets by mouth once daily Patient taking differently: Take 20 mg by mouth daily. 01/25/22   Hilty, Nadean Corwin, MD      Allergies    Clonidine derivatives, Clonidine hcl, Crestor [rosuvastatin], Losartan potassium, Sulfa antibiotics, Epinephrine, and Hydralazine hcl    Review of Systems   Review of Systems  Musculoskeletal:  Positive for arthralgias, gait problem and myalgias.  All other systems reviewed and are negative.  Physical Exam Updated Vital Signs BP (!) 168/63   Pulse 60   Temp 97.9 F (36.6 C) (Oral)   Resp (!) 9   SpO2 98%  Physical Exam Vitals and nursing note reviewed.  Constitutional:      General: She is not in acute distress.    Appearance: She is normal weight. She is ill-appearing.  HENT:     Head: Normocephalic and atraumatic.  Eyes:     General: No scleral icterus.    Extraocular Movements: Extraocular movements intact.     Pupils: Pupils are equal, round, and reactive to light.  Cardiovascular:     Rate and Rhythm: Normal rate and regular rhythm.     Pulses: Normal pulses.     Heart sounds: No murmur heard. Pulmonary:     Effort: Pulmonary effort is normal. No respiratory distress.     Breath sounds: Normal breath sounds.  Abdominal:     General: Bowel sounds are normal. There is no distension.     Palpations: Abdomen is soft.     Tenderness: There is no abdominal tenderness.  Musculoskeletal:        General: Swelling, tenderness, deformity and signs of injury present.     Left upper arm: Tenderness present.     Left wrist: Normal pulse.     Cervical back: Normal range of motion and neck supple. No tenderness.     Right hip: Normal.     Left hip: Deformity and tenderness present. Decreased range of motion.     Comments: Shortening and slight external rotation of the left hip  Bulging at the proximal left anterior thigh. TTP. DP pulse 1+. Sensation intact     Skin:    General: Skin is warm  and dry.     Capillary Refill: Capillary refill takes less than 2 seconds.  Neurological:     General: No focal deficit present.     Mental Status: She is alert. Mental status is at baseline.     Cranial Nerves: No cranial nerve deficit.  Psychiatric:        Mood and Affect: Mood normal.        Behavior:  Behavior normal.        Thought Content: Thought content normal.        Judgment: Judgment normal.    ED Results / Procedures / Treatments   Labs (all labs ordered are listed, but only abnormal results are displayed) Labs Reviewed  BASIC METABOLIC PANEL - Abnormal; Notable for the following components:      Result Value   Glucose, Bld 68 (*)    BUN 58 (*)    Creatinine, Ser 2.43 (*)    GFR, Estimated 19 (*)    All other components within normal limits  CBC WITH DIFFERENTIAL/PLATELET - Abnormal; Notable for the following components:   WBC 11.1 (*)    RBC 3.15 (*)    Hemoglobin 10.4 (*)    HCT 30.1 (*)    Neutro Abs 8.7 (*)    All other components within normal limits  URINALYSIS, ROUTINE W REFLEX MICROSCOPIC - Abnormal; Notable for the following components:   Color, Urine STRAW (*)    Protein, ur 100 (*)    All other components within normal limits  CBG MONITORING, ED  TYPE AND SCREEN   EKG EKG Interpretation  Date/Time:  Thursday Mar 18 2022 00:51:22 EDT Ventricular Rate:  60 PR Interval:  172 QRS Duration: 114 QT Interval:  504 QTC Calculation: 504 R Axis:   11 Text Interpretation: Sinus or ectopic atrial rhythm Anterior infarct, old Nonspecific repol abnormality, lateral leads Prolonged QT interval Confirmed by Thayer Jew [id number]) on 03/18/2022 12:52:59 AM  Radiology CT Head Wo Contrast  Result Date: 03/18/2022 CLINICAL DATA:  Polytrauma, blunt.  Fall, head injury EXAM: CT HEAD WITHOUT CONTRAST CT CERVICAL SPINE WITHOUT CONTRAST TECHNIQUE: Multidetector CT imaging of the head and cervical spine was performed following the standard protocol without  intravenous contrast. Multiplanar CT image reconstructions of the cervical spine were also generated. RADIATION DOSE REDUCTION: This exam was performed according to the departmental dose-optimization program which includes automated exposure control, adjustment of the mA and/or kV according to patient size and/or use of iterative reconstruction technique. COMPARISON:  None Available. FINDINGS: CT HEAD FINDINGS Brain: Normal anatomic configuration. Parenchymal volume loss is commensurate with the patient's age. Mild periventricular white matter changes are present likely reflecting the sequela of small vessel ischemia. No abnormal intra or extra-axial mass lesion or fluid collection. No abnormal mass effect or midline shift. No evidence of acute intracranial hemorrhage or infarct. Ventricular size is normal. Cerebellum unremarkable. Vascular: No asymmetric hyperdense vasculature at the skull base. Skull: Intact Sinuses/Orbits: Paranasal sinuses are clear. Orbits are unremarkable. Other: Mastoid air cells and middle ear cavities are clear. CT CERVICAL SPINE FINDINGS Alignment: Normal. Skull base and vertebrae: Craniocervical alignment is normal. Atlantodental interval is not widened. No acute fracture of the cervical spine. Vertebral body height is preserved. Soft tissues and spinal canal: No canal hematoma. No prevertebral soft tissue swelling or paravertebral fluid collection. Extensive atherosclerotic calcification within the carotid bifurcations. No pathologic cervical adenopathy. Disc levels: Intervertebral disc space narrowing and endplate remodeling at G6-4 in keeping with changes of advanced degenerative disc disease. Mild degenerative changes are seen throughout the remainder of the cervical spine. Prevertebral soft tissues are not thickened on sagittal reformats. The spinal canal is widely patent. No significant neuroforaminal narrowing. Upper chest: Unremarkable Other: None IMPRESSION: No acute intracranial  injury.  No calvarial fracture. No acute fracture or listhesis of the cervical spine. Peripheral vascular disease with extensive atherosclerotic calcification within the carotid bifurcations. Electronically Signed   By: Cassandria Anger  Christa See M.D.   On: 03/18/2022 01:59   CT Cervical Spine Wo Contrast  Result Date: 03/18/2022 CLINICAL DATA:  Polytrauma, blunt.  Fall, head injury EXAM: CT HEAD WITHOUT CONTRAST CT CERVICAL SPINE WITHOUT CONTRAST TECHNIQUE: Multidetector CT imaging of the head and cervical spine was performed following the standard protocol without intravenous contrast. Multiplanar CT image reconstructions of the cervical spine were also generated. RADIATION DOSE REDUCTION: This exam was performed according to the departmental dose-optimization program which includes automated exposure control, adjustment of the mA and/or kV according to patient size and/or use of iterative reconstruction technique. COMPARISON:  None Available. FINDINGS: CT HEAD FINDINGS Brain: Normal anatomic configuration. Parenchymal volume loss is commensurate with the patient's age. Mild periventricular white matter changes are present likely reflecting the sequela of small vessel ischemia. No abnormal intra or extra-axial mass lesion or fluid collection. No abnormal mass effect or midline shift. No evidence of acute intracranial hemorrhage or infarct. Ventricular size is normal. Cerebellum unremarkable. Vascular: No asymmetric hyperdense vasculature at the skull base. Skull: Intact Sinuses/Orbits: Paranasal sinuses are clear. Orbits are unremarkable. Other: Mastoid air cells and middle ear cavities are clear. CT CERVICAL SPINE FINDINGS Alignment: Normal. Skull base and vertebrae: Craniocervical alignment is normal. Atlantodental interval is not widened. No acute fracture of the cervical spine. Vertebral body height is preserved. Soft tissues and spinal canal: No canal hematoma. No prevertebral soft tissue swelling or paravertebral  fluid collection. Extensive atherosclerotic calcification within the carotid bifurcations. No pathologic cervical adenopathy. Disc levels: Intervertebral disc space narrowing and endplate remodeling at W0-9 in keeping with changes of advanced degenerative disc disease. Mild degenerative changes are seen throughout the remainder of the cervical spine. Prevertebral soft tissues are not thickened on sagittal reformats. The spinal canal is widely patent. No significant neuroforaminal narrowing. Upper chest: Unremarkable Other: None IMPRESSION: No acute intracranial injury.  No calvarial fracture. No acute fracture or listhesis of the cervical spine. Peripheral vascular disease with extensive atherosclerotic calcification within the carotid bifurcations. Electronically Signed   By: Fidela Salisbury M.D.   On: 03/18/2022 01:59   DG Humerus Left  Result Date: 03/18/2022 CLINICAL DATA:  Fall with deformity. EXAM: LEFT HUMERUS - 2+ VIEW COMPARISON:  11/23/2016, 02/10/2022. FINDINGS: There is deformity of the left humeral head/neck which is improved from the prior exam and likely related to old trauma. No acute fracture or dislocation is seen. Degenerative changes are present at the acromioclavicular joint. IMPRESSION: Bony deformity of the left humeral head/neck, unchanged from the previous exam and likely related to old trauma. No acute fracture is identified. Electronically Signed   By: Brett Fairy M.D.   On: 03/18/2022 00:43   DG Hip Unilat With Pelvis 2-3 Views Left  Result Date: 03/18/2022 CLINICAL DATA:  Fall, with deformity. EXAM: LEFT FEMUR 1 VIEW; DG HIP (WITH OR WITHOUT PELVIS) 2-3V LEFT COMPARISON:  05/17/2019. FINDINGS: There is a comminuted intertrochanteric fracture of the left hip with mild lateral angulation and superior subluxation of the distal fracture fragment. No dislocation is seen. The remaining bony structures appear intact. Total knee arthroplasty changes are noted on the left without  evidence of hardware loosening. Mild degenerative changes are noted in the lower lumbar spine. Vascular calcifications are noted in the pelvis and lower extremities bilaterally. IMPRESSION: Comminuted angulated intratrochanteric fracture of the proximal left femur. Electronically Signed   By: Brett Fairy M.D.   On: 03/18/2022 00:39   DG Femur 1V Left  Result Date: 03/18/2022 CLINICAL DATA:  Fall, with  deformity. EXAM: LEFT FEMUR 1 VIEW; DG HIP (WITH OR WITHOUT PELVIS) 2-3V LEFT COMPARISON:  05/17/2019. FINDINGS: There is a comminuted intertrochanteric fracture of the left hip with mild lateral angulation and superior subluxation of the distal fracture fragment. No dislocation is seen. The remaining bony structures appear intact. Total knee arthroplasty changes are noted on the left without evidence of hardware loosening. Mild degenerative changes are noted in the lower lumbar spine. Vascular calcifications are noted in the pelvis and lower extremities bilaterally. IMPRESSION: Comminuted angulated intratrochanteric fracture of the proximal left femur. Electronically Signed   By: Brett Fairy M.D.   On: 03/18/2022 00:39    Procedures Procedures   Medications Ordered in ED Medications  fentaNYL (SUBLIMAZE) injection 12.5 mcg (0 mcg Intravenous Hold 03/18/22 0045)    ED Course/ Medical Decision Making/ A&P Clinical Course as of 03/18/22 0208  Thu Mar 18, 2022  0056 DG Femur 1V Left [LA]    Clinical Course User Index [LA] Mickie Hillier, PA-C                           Medical Decision Making Amount and/or Complexity of Data Reviewed Labs: ordered. Radiology: ordered. Decision-making details documented in ED Course.  Risk Prescription drug management. Decision regarding hospitalization.  This patient presents to the ED for concern of fall, this involves an extensive number of treatment options, and is a complaint that carries with it a high risk of complications and morbidity.  The  differential diagnosis includes intracranial, spinal, extremity, intrathoracic or intra-abdominal injuries  Co morbidities that complicate the patient evaluation CAD, CKD, diastolic CHF, CABG  Additional history obtained:  Additional history obtained from: daughter at bedside  External records from outside source obtained and reviewed including: previous PCP physician note  EKG: EKG: normal EKG, normal sinus rhythm, prolonged QT interval.   Cardiac Monitoring: The patient was maintained on a cardiac monitor.  I personally viewed and interpreted the cardiac monitored which showed an underlying rhythm of: sinus   Lab Results: I personally ordered, reviewed, and interpreted labs. Pertinent results include: CBC with leukocytosis of 11.1, likely reactive BMP with creatinine of 2.43, appears to be baseline UA negative Glucose 77  Imaging Studies ordered:  I ordered imaging studies which included x-ray and CT.  I independently reviewed & interpreted imaging & am in agreement with radiology impression. Imaging shows: CT head within normal limits CT C-spine within normal limits Plain film of the left humerus with no acute fractures Left femur comminuted angulated intertrochanteric fracture of the proximal left femur   Medications  I ordered medication including fentanyl for pain Reevaluation of the patient after medication shows that patient  patient declined wanting pain medication -I reviewed the patient's home medications and did not make adjustments. -I did not prescribe new home medications.  Tests Considered: No further testing at this time  Critical Interventions: Orthopedics consult  Consultations: I requested consultation with the orthopedic surgeon Dr. Mable Fill @ 0208,  and discussed lab and imaging findings as well as pertinent plan - they recommend: will come evaluate the patient.  Dr. Mable Fill requests hospitalist admission. Spoke with Dr. Bridgett Larsson @ [phone] who accepts patient  for admission.  SDH None identified  ED Course: 81 year old female who presents to the emergency department after mechanical fall at home.  On physical exam she has obvious shortened left leg with mild internal rotation.  She does have swelling to the proximal thigh on the left  side.  She also complains of left-sided pain to the humerus.  I do not see any deformity.  She is overall neurovascularly intact.  No other obvious injuries on my physical exam. Labs are relatively unremarkable.  No acute findings. Do not feel that patient fell due to cardiovascular cause, acute intracranial bleed or ischemia. EKG without arrhythmia. CT head negative.  Films negative except for her comminuted angulated intertrochanteric left femur fracture.  Not anticoagulated. I did consult orthopedic surgery and spoke with Dr. Mable Fill.  He request hospitalist admission.  I spoke with Dr. Bridgett Larsson who has accepted the patient for admission. I have offered the patient pain medication and she has declined.  We will continue to monitor her pain prior to going to her room.  After consideration of the diagnostic results and the patients response to treatment, I feel that the patent would benefit from admission. The patient has been appropriately medically screened and/or stabilized in the ED.  Final Clinical Impression(s) / ED Diagnoses Final diagnoses:  Closed displaced comminuted fracture of shaft of left femur, initial encounter [facility])    Rx / DC Orders ED Discharge Orders     None         Mickie Hillier, PA-C 03/18/22 8850    Dina Rich Barbette Hair, MD 03/18/22 0600

## 2022-03-18 ENCOUNTER — Inpatient Hospital Stay (HOSPITAL_COMMUNITY): Payer: Medicare Other | Admitting: Anesthesiology

## 2022-03-18 ENCOUNTER — Emergency Department (HOSPITAL_COMMUNITY): Payer: Medicare Other

## 2022-03-18 ENCOUNTER — Encounter (HOSPITAL_COMMUNITY): Payer: Self-pay

## 2022-03-18 ENCOUNTER — Inpatient Hospital Stay (HOSPITAL_COMMUNITY): Payer: Medicare Other

## 2022-03-18 ENCOUNTER — Other Ambulatory Visit: Payer: Self-pay

## 2022-03-18 ENCOUNTER — Other Ambulatory Visit (HOSPITAL_COMMUNITY): Payer: Self-pay | Admitting: Radiology

## 2022-03-18 ENCOUNTER — Encounter (HOSPITAL_COMMUNITY)
Admission: EM | Disposition: A | Discharge status: Skilled Nursing Facility | Payer: Self-pay | Source: Home / Self Care | Attending: Internal Medicine

## 2022-03-18 DIAGNOSIS — Z794 Long term (current) use of insulin: Secondary | ICD-10-CM | POA: Diagnosis not present

## 2022-03-18 DIAGNOSIS — S72142A Displaced intertrochanteric fracture of left femur, initial encounter for closed fracture: Secondary | ICD-10-CM | POA: Diagnosis present

## 2022-03-18 DIAGNOSIS — N179 Acute kidney failure, unspecified: Secondary | ICD-10-CM | POA: Diagnosis present

## 2022-03-18 DIAGNOSIS — D631 Anemia in chronic kidney disease: Secondary | ICD-10-CM

## 2022-03-18 DIAGNOSIS — E1140 Type 2 diabetes mellitus with diabetic neuropathy, unspecified: Secondary | ICD-10-CM | POA: Diagnosis present

## 2022-03-18 DIAGNOSIS — E1122 Type 2 diabetes mellitus with diabetic chronic kidney disease: Secondary | ICD-10-CM | POA: Diagnosis present

## 2022-03-18 DIAGNOSIS — E039 Hypothyroidism, unspecified: Secondary | ICD-10-CM

## 2022-03-18 DIAGNOSIS — Z043 Encounter for examination and observation following other accident: Secondary | ICD-10-CM | POA: Diagnosis not present

## 2022-03-18 DIAGNOSIS — E119 Type 2 diabetes mellitus without complications: Secondary | ICD-10-CM

## 2022-03-18 DIAGNOSIS — Z9889 Other specified postprocedural states: Secondary | ICD-10-CM | POA: Diagnosis not present

## 2022-03-18 DIAGNOSIS — E785 Hyperlipidemia, unspecified: Secondary | ICD-10-CM

## 2022-03-18 DIAGNOSIS — E1169 Type 2 diabetes mellitus with other specified complication: Secondary | ICD-10-CM | POA: Diagnosis not present

## 2022-03-18 DIAGNOSIS — F419 Anxiety disorder, unspecified: Secondary | ICD-10-CM | POA: Diagnosis present

## 2022-03-18 DIAGNOSIS — N184 Chronic kidney disease, stage 4 (severe): Secondary | ICD-10-CM

## 2022-03-18 DIAGNOSIS — Z951 Presence of aortocoronary bypass graft: Secondary | ICD-10-CM | POA: Diagnosis not present

## 2022-03-18 DIAGNOSIS — S199XXA Unspecified injury of neck, initial encounter: Secondary | ICD-10-CM | POA: Diagnosis not present

## 2022-03-18 DIAGNOSIS — Z66 Do not resuscitate: Secondary | ICD-10-CM | POA: Diagnosis present

## 2022-03-18 DIAGNOSIS — I495 Sick sinus syndrome: Secondary | ICD-10-CM | POA: Diagnosis not present

## 2022-03-18 DIAGNOSIS — I480 Paroxysmal atrial fibrillation: Secondary | ICD-10-CM

## 2022-03-18 DIAGNOSIS — Z950 Presence of cardiac pacemaker: Secondary | ICD-10-CM | POA: Diagnosis present

## 2022-03-18 DIAGNOSIS — E1042 Type 1 diabetes mellitus with diabetic polyneuropathy: Secondary | ICD-10-CM | POA: Diagnosis not present

## 2022-03-18 DIAGNOSIS — N189 Chronic kidney disease, unspecified: Secondary | ICD-10-CM | POA: Diagnosis not present

## 2022-03-18 DIAGNOSIS — E1159 Type 2 diabetes mellitus with other circulatory complications: Secondary | ICD-10-CM | POA: Diagnosis not present

## 2022-03-18 DIAGNOSIS — F418 Other specified anxiety disorders: Secondary | ICD-10-CM | POA: Diagnosis not present

## 2022-03-18 DIAGNOSIS — I5032 Chronic diastolic (congestive) heart failure: Secondary | ICD-10-CM | POA: Diagnosis present

## 2022-03-18 DIAGNOSIS — D696 Thrombocytopenia, unspecified: Secondary | ICD-10-CM | POA: Diagnosis not present

## 2022-03-18 DIAGNOSIS — E871 Hypo-osmolality and hyponatremia: Secondary | ICD-10-CM | POA: Diagnosis not present

## 2022-03-18 DIAGNOSIS — E1165 Type 2 diabetes mellitus with hyperglycemia: Secondary | ICD-10-CM | POA: Diagnosis present

## 2022-03-18 DIAGNOSIS — I2510 Atherosclerotic heart disease of native coronary artery without angina pectoris: Secondary | ICD-10-CM | POA: Diagnosis present

## 2022-03-18 DIAGNOSIS — F32A Depression, unspecified: Secondary | ICD-10-CM | POA: Diagnosis present

## 2022-03-18 DIAGNOSIS — S0990XA Unspecified injury of head, initial encounter: Secondary | ICD-10-CM | POA: Diagnosis not present

## 2022-03-18 DIAGNOSIS — K2100 Gastro-esophageal reflux disease with esophagitis, without bleeding: Secondary | ICD-10-CM

## 2022-03-18 DIAGNOSIS — I10 Essential (primary) hypertension: Secondary | ICD-10-CM | POA: Diagnosis not present

## 2022-03-18 DIAGNOSIS — H9190 Unspecified hearing loss, unspecified ear: Secondary | ICD-10-CM | POA: Diagnosis present

## 2022-03-18 DIAGNOSIS — G3184 Mild cognitive impairment, so stated: Secondary | ICD-10-CM | POA: Diagnosis present

## 2022-03-18 DIAGNOSIS — D62 Acute posthemorrhagic anemia: Secondary | ICD-10-CM | POA: Diagnosis not present

## 2022-03-18 DIAGNOSIS — W010XXA Fall on same level from slipping, tripping and stumbling without subsequent striking against object, initial encounter: Secondary | ICD-10-CM | POA: Diagnosis present

## 2022-03-18 DIAGNOSIS — M21922 Unspecified acquired deformity of left upper arm: Secondary | ICD-10-CM | POA: Diagnosis not present

## 2022-03-18 DIAGNOSIS — K219 Gastro-esophageal reflux disease without esophagitis: Secondary | ICD-10-CM | POA: Diagnosis present

## 2022-03-18 DIAGNOSIS — M80852A Other osteoporosis with current pathological fracture, left femur, initial encounter for fracture: Secondary | ICD-10-CM | POA: Diagnosis present

## 2022-03-18 DIAGNOSIS — I152 Hypertension secondary to endocrine disorders: Secondary | ICD-10-CM

## 2022-03-18 DIAGNOSIS — E440 Moderate protein-calorie malnutrition: Secondary | ICD-10-CM | POA: Diagnosis present

## 2022-03-18 DIAGNOSIS — I130 Hypertensive heart and chronic kidney disease with heart failure and stage 1 through stage 4 chronic kidney disease, or unspecified chronic kidney disease: Secondary | ICD-10-CM | POA: Diagnosis not present

## 2022-03-18 DIAGNOSIS — N281 Cyst of kidney, acquired: Secondary | ICD-10-CM | POA: Diagnosis not present

## 2022-03-18 HISTORY — PX: INTRAMEDULLARY (IM) NAIL INTERTROCHANTERIC: SHX5875

## 2022-03-18 LAB — CBC WITH DIFFERENTIAL/PLATELET
Abs Immature Granulocytes: 0.05 10*3/uL (ref 0.00–0.07)
Basophils Absolute: 0 10*3/uL (ref 0.0–0.1)
Basophils Relative: 0 %
Eosinophils Absolute: 0.2 10*3/uL (ref 0.0–0.5)
Eosinophils Relative: 2 %
HCT: 30.1 % — ABNORMAL LOW (ref 36.0–46.0)
Hemoglobin: 10.4 g/dL — ABNORMAL LOW (ref 12.0–15.0)
Immature Granulocytes: 1 %
Lymphocytes Relative: 13 %
Lymphs Abs: 1.5 10*3/uL (ref 0.7–4.0)
MCH: 33 pg (ref 26.0–34.0)
MCHC: 34.6 g/dL (ref 30.0–36.0)
MCV: 95.6 fL (ref 80.0–100.0)
Monocytes Absolute: 0.6 10*3/uL (ref 0.1–1.0)
Monocytes Relative: 5 %
Neutro Abs: 8.7 10*3/uL — ABNORMAL HIGH (ref 1.7–7.7)
Neutrophils Relative %: 79 %
Platelets: 155 10*3/uL (ref 150–400)
RBC: 3.15 MIL/uL — ABNORMAL LOW (ref 3.87–5.11)
RDW: 13.8 % (ref 11.5–15.5)
WBC: 11.1 10*3/uL — ABNORMAL HIGH (ref 4.0–10.5)
nRBC: 0 % (ref 0.0–0.2)

## 2022-03-18 LAB — URINALYSIS, ROUTINE W REFLEX MICROSCOPIC
Bacteria, UA: NONE SEEN
Bilirubin Urine: NEGATIVE
Glucose, UA: NEGATIVE mg/dL
Hgb urine dipstick: NEGATIVE
Ketones, ur: NEGATIVE mg/dL
Leukocytes,Ua: NEGATIVE
Nitrite: NEGATIVE
Protein, ur: 100 mg/dL — AB
Specific Gravity, Urine: 1.009 (ref 1.005–1.030)
pH: 5 (ref 5.0–8.0)

## 2022-03-18 LAB — BASIC METABOLIC PANEL
Anion gap: 11 (ref 5–15)
BUN: 58 mg/dL — ABNORMAL HIGH (ref 8–23)
CO2: 24 mmol/L (ref 22–32)
Calcium: 9 mg/dL (ref 8.9–10.3)
Chloride: 104 mmol/L (ref 98–111)
Creatinine, Ser: 2.43 mg/dL — ABNORMAL HIGH (ref 0.44–1.00)
GFR, Estimated: 19 mL/min — ABNORMAL LOW (ref >60)
Glucose, Bld: 68 mg/dL — ABNORMAL LOW (ref 70–99)
Potassium: 4.3 mmol/L (ref 3.5–5.1)
Sodium: 139 mmol/L (ref 135–145)

## 2022-03-18 LAB — CBG MONITORING, ED: Glucose-Capillary: 77 mg/dL (ref 70–99)

## 2022-03-18 LAB — SURGICAL PCR SCREEN
MRSA, PCR: NEGATIVE
Staphylococcus aureus: NEGATIVE

## 2022-03-18 LAB — GLUCOSE, CAPILLARY
Glucose-Capillary: 140 mg/dL — ABNORMAL HIGH (ref 70–99)
Glucose-Capillary: 156 mg/dL — ABNORMAL HIGH (ref 70–99)
Glucose-Capillary: 213 mg/dL — ABNORMAL HIGH (ref 70–99)
Glucose-Capillary: 343 mg/dL — ABNORMAL HIGH (ref 70–99)

## 2022-03-18 SURGERY — FIXATION, FRACTURE, INTERTROCHANTERIC, WITH INTRAMEDULLARY ROD
Anesthesia: Spinal | Laterality: Left

## 2022-03-18 MED ORDER — ACETAMINOPHEN 500 MG PO TABS
500.0000 mg | ORAL_TABLET | Freq: Four times a day (QID) | ORAL | Status: AC
  Administered 2022-03-19 (×3): 500 mg via ORAL
  Filled 2022-03-18 (×3): qty 1

## 2022-03-18 MED ORDER — 0.9 % SODIUM CHLORIDE (POUR BTL) OPTIME
TOPICAL | Status: DC | PRN
  Administered 2022-03-18: 1000 mL

## 2022-03-18 MED ORDER — ONDANSETRON HCL 4 MG/2ML IJ SOLN: 4.0000 mg | Freq: Four times a day (QID) | INTRAMUSCULAR | Status: DC | PRN

## 2022-03-18 MED ORDER — MORPHINE SULFATE (PF) 2 MG/ML IV SOLN: 0.5000 mg | INTRAVENOUS | Status: DC | PRN

## 2022-03-18 MED ORDER — ONDANSETRON 4 MG PO TBDP: 4.0000 mg | ORAL_TABLET | Freq: Once | ORAL | Status: DC

## 2022-03-18 MED ORDER — PROPOFOL 500 MG/50ML IV EMUL
INTRAVENOUS | Status: DC | PRN
  Administered 2022-03-18: 50 ug/kg/min via INTRAVENOUS

## 2022-03-18 MED ORDER — PHENYLEPHRINE HCL-NACL 20-0.9 MG/250ML-% IV SOLN
INTRAVENOUS | Status: DC | PRN
  Administered 2022-03-18: 75 ug/min via INTRAVENOUS

## 2022-03-18 MED ORDER — HYDROMORPHONE HCL 1 MG/ML IJ SOLN: 0.5000 mg | INTRAMUSCULAR | Status: DC | PRN

## 2022-03-18 MED ORDER — DOCUSATE SODIUM 100 MG PO CAPS: 100.0000 mg | ORAL_CAPSULE | Freq: Two times a day (BID) | ORAL | Status: DC

## 2022-03-18 MED ORDER — ENOXAPARIN SODIUM 30 MG/0.3ML IJ SOSY
30.0000 mg | PREFILLED_SYRINGE | INTRAMUSCULAR | Status: DC
  Administered 2022-03-19 – 2022-03-23 (×5): 30 mg via SUBCUTANEOUS
  Filled 2022-03-18 (×5): qty 0.3

## 2022-03-18 MED ORDER — FENTANYL CITRATE (PF) 250 MCG/5ML IJ SOLN
INTRAMUSCULAR | Status: DC | PRN
  Administered 2022-03-18: 50 ug via INTRAVENOUS

## 2022-03-18 MED ORDER — ORAL CARE MOUTH RINSE: 15.0000 mL | Freq: Once | OROMUCOSAL | Status: AC

## 2022-03-18 MED ORDER — INSULIN ASPART 100 UNIT/ML IJ SOLN
0.0000 [IU] | Freq: Three times a day (TID) | INTRAMUSCULAR | Status: DC
  Administered 2022-03-18: 1 [IU] via SUBCUTANEOUS
  Administered 2022-03-19: 5 [IU] via SUBCUTANEOUS

## 2022-03-18 MED ORDER — BUPIVACAINE IN DEXTROSE 0.75-8.25 % IT SOLN
INTRATHECAL | Status: DC | PRN
  Administered 2022-03-18: 12 mg via INTRATHECAL

## 2022-03-18 MED ORDER — CHLORHEXIDINE GLUCONATE 4 % EX LIQD: 60.0000 mL | Freq: Once | CUTANEOUS | Status: DC

## 2022-03-18 MED ORDER — ROSUVASTATIN CALCIUM 5 MG PO TABS
10.0000 mg | ORAL_TABLET | Freq: Every day | ORAL | Status: DC
  Administered 2022-03-18 – 2022-03-22 (×5): 10 mg via ORAL
  Filled 2022-03-18 (×5): qty 2

## 2022-03-18 MED ORDER — ONDANSETRON HCL 4 MG PO TABS: 4.0000 mg | ORAL_TABLET | Freq: Four times a day (QID) | ORAL | Status: DC | PRN

## 2022-03-18 MED ORDER — HYDROCODONE-ACETAMINOPHEN 5-325 MG PO TABS
1.0000 | ORAL_TABLET | ORAL | Status: DC | PRN
  Administered 2022-03-19 – 2022-03-23 (×4): 1 via ORAL
  Filled 2022-03-18 (×4): qty 1

## 2022-03-18 MED ORDER — CHLORHEXIDINE GLUCONATE 0.12 % MT SOLN: 15.0000 mL | Freq: Once | OROMUCOSAL | Status: AC

## 2022-03-18 MED ORDER — ACETAMINOPHEN 325 MG PO TABS
325.0000 mg | ORAL_TABLET | Freq: Four times a day (QID) | ORAL | Status: DC | PRN
  Administered 2022-03-20: 650 mg via ORAL
  Filled 2022-03-18: qty 2

## 2022-03-18 MED ORDER — FENTANYL CITRATE (PF) 100 MCG/2ML IJ SOLN: 25.0000 ug | INTRAMUSCULAR | Status: DC | PRN

## 2022-03-18 MED ORDER — ROSUVASTATIN CALCIUM 5 MG PO TABS
10.0000 mg | ORAL_TABLET | Freq: Every day | ORAL | Status: DC
  Filled 2022-03-18: qty 2

## 2022-03-18 MED ORDER — MENTHOL 3 MG MT LOZG: 1.0000 | LOZENGE | OROMUCOSAL | Status: DC | PRN

## 2022-03-18 MED ORDER — TRANEXAMIC ACID-NACL 1000-0.7 MG/100ML-% IV SOLN
1000.0000 mg | INTRAVENOUS | Status: AC
  Administered 2022-03-18: 1000 mg via INTRAVENOUS
  Filled 2022-03-18: qty 100

## 2022-03-18 MED ORDER — CARVEDILOL 25 MG PO TABS
25.0000 mg | ORAL_TABLET | Freq: Two times a day (BID) | ORAL | Status: DC
  Administered 2022-03-18 – 2022-03-23 (×11): 25 mg via ORAL
  Filled 2022-03-18 (×11): qty 1

## 2022-03-18 MED ORDER — DEXTROSE-NACL 5-0.9 % IV SOLN: INTRAVENOUS | Status: AC

## 2022-03-18 MED ORDER — LACTATED RINGERS IV SOLN: INTRAVENOUS | Status: DC

## 2022-03-18 MED ORDER — ISOSORBIDE MONONITRATE ER 30 MG PO TB24
30.0000 mg | ORAL_TABLET | Freq: Every day | ORAL | Status: DC
  Administered 2022-03-18 – 2022-03-23 (×6): 30 mg via ORAL
  Filled 2022-03-18 (×6): qty 1

## 2022-03-18 MED ORDER — GABAPENTIN 300 MG PO CAPS
300.0000 mg | ORAL_CAPSULE | Freq: Every day | ORAL | Status: DC
  Administered 2022-03-18 – 2022-03-22 (×5): 300 mg via ORAL
  Filled 2022-03-18 (×5): qty 1

## 2022-03-18 MED ORDER — ONDANSETRON HCL 4 MG/2ML IJ SOLN
INTRAMUSCULAR | Status: AC
  Filled 2022-03-18: qty 4

## 2022-03-18 MED ORDER — HYDROCODONE-ACETAMINOPHEN 5-325 MG PO TABS: 1.0000 | ORAL_TABLET | ORAL | Status: DC | PRN

## 2022-03-18 MED ORDER — FENTANYL CITRATE (PF) 250 MCG/5ML IJ SOLN
INTRAMUSCULAR | Status: AC
  Filled 2022-03-18: qty 5

## 2022-03-18 MED ORDER — DEXAMETHASONE SODIUM PHOSPHATE 10 MG/ML IJ SOLN
INTRAMUSCULAR | Status: DC | PRN
Start: 2022-03-18 — End: 2022-03-18
  Administered 2022-03-18: 10 mg via INTRAVENOUS

## 2022-03-18 MED ORDER — BUPIVACAINE-EPINEPHRINE (PF) 0.5% -1:200000 IJ SOLN
INTRAMUSCULAR | Status: DC | PRN
  Administered 2022-03-18: 25 mL via PERINEURAL

## 2022-03-18 MED ORDER — LIDOCAINE 2% (20 MG/ML) 5 ML SYRINGE
INTRAMUSCULAR | Status: DC | PRN
  Administered 2022-03-18: 40 mg via INTRAVENOUS

## 2022-03-18 MED ORDER — AMLODIPINE BESYLATE 5 MG PO TABS
5.0000 mg | ORAL_TABLET | Freq: Every day | ORAL | Status: DC
  Administered 2022-03-18 – 2022-03-23 (×6): 5 mg via ORAL
  Filled 2022-03-18 (×6): qty 1

## 2022-03-18 MED ORDER — CEFAZOLIN SODIUM-DEXTROSE 2-4 GM/100ML-% IV SOLN
2.0000 g | INTRAVENOUS | Status: AC
  Administered 2022-03-18: 2 g via INTRAVENOUS
  Filled 2022-03-18: qty 100

## 2022-03-18 MED ORDER — CHLORHEXIDINE GLUCONATE 0.12 % MT SOLN
OROMUCOSAL | Status: AC
  Administered 2022-03-18: 15 mL via OROMUCOSAL
  Filled 2022-03-18: qty 15

## 2022-03-18 MED ORDER — ACETAMINOPHEN 325 MG PO TABS: 650.0000 mg | ORAL_TABLET | Freq: Four times a day (QID) | ORAL | Status: DC | PRN

## 2022-03-18 MED ORDER — CHLORHEXIDINE GLUCONATE 0.12 % MT SOLN: 15.0000 mL | Freq: Once | OROMUCOSAL | Status: DC

## 2022-03-18 MED ORDER — SODIUM CHLORIDE 0.9 % IV SOLN: INTRAVENOUS | Status: DC

## 2022-03-18 MED ORDER — INSULIN GLARGINE-YFGN 100 UNIT/ML ~~LOC~~ SOLN
20.0000 [IU] | Freq: Every day | SUBCUTANEOUS | Status: DC
  Administered 2022-03-18: 20 [IU] via SUBCUTANEOUS
  Filled 2022-03-18 (×3): qty 0.2

## 2022-03-18 MED ORDER — VANCOMYCIN HCL 1000 MG IV SOLR
INTRAVENOUS | Status: DC | PRN
Start: 2022-03-18 — End: 2022-03-18
  Administered 2022-03-18: 1000 mg

## 2022-03-18 MED ORDER — PHENOL 1.4 % MT LIQD: 1.0000 | OROMUCOSAL | Status: DC | PRN

## 2022-03-18 MED ORDER — TORSEMIDE 20 MG PO TABS: 40.0000 mg | ORAL_TABLET | Freq: Every day | ORAL | Status: DC

## 2022-03-18 MED ORDER — DEXMEDETOMIDINE (PRECEDEX) IN NS 20 MCG/5ML (4 MCG/ML) IV SYRINGE
PREFILLED_SYRINGE | INTRAVENOUS | Status: AC
  Filled 2022-03-18: qty 5

## 2022-03-18 MED ORDER — VANCOMYCIN HCL 1000 MG IV SOLR
INTRAVENOUS | Status: AC
  Filled 2022-03-18: qty 20

## 2022-03-18 MED ORDER — SERTRALINE HCL 25 MG PO TABS
25.0000 mg | ORAL_TABLET | Freq: Every day | ORAL | Status: DC
  Administered 2022-03-19 – 2022-03-23 (×5): 25 mg via ORAL
  Filled 2022-03-18 (×5): qty 1

## 2022-03-18 MED ORDER — ONDANSETRON HCL 4 MG/2ML IJ SOLN
4.0000 mg | Freq: Four times a day (QID) | INTRAMUSCULAR | Status: DC | PRN
  Administered 2022-03-18 – 2022-03-22 (×2): 4 mg via INTRAVENOUS
  Filled 2022-03-18 (×2): qty 2

## 2022-03-18 MED ORDER — LEVOTHYROXINE SODIUM 100 MCG PO TABS
100.0000 ug | ORAL_TABLET | Freq: Every day | ORAL | Status: DC
  Administered 2022-03-19 – 2022-03-22 (×4): 100 ug via ORAL
  Filled 2022-03-18 (×4): qty 1

## 2022-03-18 MED ORDER — ASPIRIN 81 MG PO TBEC
81.0000 mg | DELAYED_RELEASE_TABLET | Freq: Every day | ORAL | Status: DC
Start: 2022-03-18 — End: 2022-03-23
  Administered 2022-03-19 – 2022-03-23 (×5): 81 mg via ORAL
  Filled 2022-03-18 (×5): qty 1

## 2022-03-18 MED ORDER — METOCLOPRAMIDE HCL 5 MG/ML IJ SOLN
5.0000 mg | Freq: Once | INTRAMUSCULAR | Status: AC
  Administered 2022-03-18: 5 mg via INTRAVENOUS
  Filled 2022-03-18: qty 2

## 2022-03-18 MED ORDER — TRANEXAMIC ACID-NACL 1000-0.7 MG/100ML-% IV SOLN
1000.0000 mg | Freq: Once | INTRAVENOUS | Status: AC
  Administered 2022-03-18: 1000 mg via INTRAVENOUS
  Filled 2022-03-18: qty 100

## 2022-03-18 MED ORDER — ORAL CARE MOUTH RINSE: 15.0000 mL | Freq: Once | OROMUCOSAL | Status: DC

## 2022-03-18 MED ORDER — CEFAZOLIN SODIUM-DEXTROSE 2-4 GM/100ML-% IV SOLN
2.0000 g | Freq: Four times a day (QID) | INTRAVENOUS | Status: AC
  Administered 2022-03-19: 2 g via INTRAVENOUS
  Filled 2022-03-18: qty 100

## 2022-03-18 MED ORDER — LIDOCAINE 2% (20 MG/ML) 5 ML SYRINGE
INTRAMUSCULAR | Status: AC
  Filled 2022-03-18: qty 10

## 2022-03-18 MED ORDER — DOCUSATE SODIUM 100 MG PO CAPS
100.0000 mg | ORAL_CAPSULE | Freq: Two times a day (BID) | ORAL | Status: DC
  Administered 2022-03-18 – 2022-03-20 (×5): 100 mg via ORAL
  Filled 2022-03-18 (×5): qty 1

## 2022-03-18 MED ORDER — PROPOFOL 10 MG/ML IV BOLUS
INTRAVENOUS | Status: DC | PRN
  Administered 2022-03-18 (×2): 20 mg via INTRAVENOUS

## 2022-03-18 MED ORDER — PANTOPRAZOLE SODIUM 40 MG PO TBEC
40.0000 mg | DELAYED_RELEASE_TABLET | Freq: Every day | ORAL | Status: DC
  Administered 2022-03-19 – 2022-03-23 (×5): 40 mg via ORAL
  Filled 2022-03-18 (×5): qty 1

## 2022-03-18 MED ORDER — HYDROCODONE-ACETAMINOPHEN 7.5-325 MG PO TABS: 1.0000 | ORAL_TABLET | ORAL | Status: DC | PRN

## 2022-03-18 MED ORDER — POVIDONE-IODINE 10 % EX SWAB: 2.0000 "application " | Freq: Once | CUTANEOUS | Status: DC

## 2022-03-18 MED ORDER — INSULIN ASPART 100 UNIT/ML IJ SOLN
0.0000 [IU] | Freq: Every day | INTRAMUSCULAR | Status: DC
  Administered 2022-03-18: 2 [IU] via SUBCUTANEOUS

## 2022-03-18 MED ORDER — HYDRALAZINE HCL 20 MG/ML IJ SOLN: 10.0000 mg | Freq: Four times a day (QID) | INTRAMUSCULAR | Status: DC | PRN

## 2022-03-18 MED ORDER — DEXAMETHASONE SODIUM PHOSPHATE 10 MG/ML IJ SOLN
INTRAMUSCULAR | Status: AC
  Filled 2022-03-18: qty 1

## 2022-03-18 MED ORDER — ONDANSETRON HCL 4 MG/2ML IJ SOLN
INTRAMUSCULAR | Status: DC | PRN
  Administered 2022-03-18: 4 mg via INTRAVENOUS

## 2022-03-18 SURGICAL SUPPLY — 42 items
BAG COUNTER SPONGE SURGICOUNT (BAG) ×2 IMPLANT
BIT DRILL CALIBRATED 4.2 (BIT) IMPLANT
BIT DRILL CANN 16 HIP (BIT) ×1 IMPLANT
BIT DRILL CANN STP 6/9 HIP (BIT) ×1 IMPLANT
CHLORAPREP W/TINT 26 (MISCELLANEOUS) ×2 IMPLANT
CLSR STERI-STRIP ANTIMIC 1/2X4 (GAUZE/BANDAGES/DRESSINGS) ×1 IMPLANT
COVER PERINEAL POST (MISCELLANEOUS) ×2 IMPLANT
COVER SURGICAL LIGHT HANDLE (MISCELLANEOUS) ×2 IMPLANT
DERMABOND ADVANCED (GAUZE/BANDAGES/DRESSINGS) ×2
DERMABOND ADVANCED .7 DNX12 (GAUZE/BANDAGES/DRESSINGS) ×2 IMPLANT
DRAPE C-ARM 42X72 X-RAY (DRAPES) ×2 IMPLANT
DRAPE C-ARMOR (DRAPES) ×2 IMPLANT
DRAPE STERI IOBAN 125X83 (DRAPES) ×2 IMPLANT
DRILL BIT CALIBRATED 4.2 (BIT) ×2
DRSG AQUACEL AG ADV 3.5X14 (GAUZE/BANDAGES/DRESSINGS) ×1 IMPLANT
ELECT REM PT RETURN 9FT ADLT (ELECTROSURGICAL) ×2
ELECTRODE REM PT RTRN 9FT ADLT (ELECTROSURGICAL) ×1 IMPLANT
GLOVE BIOGEL PI IND STRL 8 (GLOVE) ×2 IMPLANT
GLOVE BIOGEL PI INDICATOR 8 (GLOVE) ×2
GLOVE ECLIPSE 7.5 STRL STRAW (GLOVE) ×4 IMPLANT
GLOVE SURG ORTHO LTX SZ7.5 (GLOVE) ×4 IMPLANT
GOWN STRL REUS W/ TWL LRG LVL3 (GOWN DISPOSABLE) ×1 IMPLANT
GOWN STRL REUS W/ TWL XL LVL3 (GOWN DISPOSABLE) ×1 IMPLANT
GOWN STRL REUS W/TWL LRG LVL3 (GOWN DISPOSABLE) ×2
GOWN STRL REUS W/TWL XL LVL3 (GOWN DISPOSABLE) ×2
GUIDEWIRE 3.2X400 (WIRE) ×3 IMPLANT
IMPL DEG TI CANN 11MM/130 (Orthopedic Implant) IMPLANT
IMPLANT DEG TI CANN 11MM/130 (Orthopedic Implant) ×2 IMPLANT
KIT BASIN OR (CUSTOM PROCEDURE TRAY) ×2 IMPLANT
NS IRRIG 1000ML POUR BTL (IV SOLUTION) ×1 IMPLANT
PACK GENERAL/GYN (CUSTOM PROCEDURE TRAY) ×2 IMPLANT
PAD ARMBOARD 7.5X6 YLW CONV (MISCELLANEOUS) ×4 IMPLANT
SCREW LOCK IM TI 5X30 (Screw) ×1 IMPLANT
SCREW TFNA HELICAL 105 (Screw) ×1 IMPLANT
SUT MNCRL+ AB 3-0 CT1 36 (SUTURE) ×2 IMPLANT
SUT MON AB 2-0 CT1 36 (SUTURE) ×4 IMPLANT
SUT MONOCRYL AB 3-0 CT1 36IN (SUTURE) ×4
SUT PDS AB 1 CT  36 (SUTURE) ×4
SUT PDS AB 1 CT 36 (SUTURE) ×2 IMPLANT
SUT VIC AB 0 CT1 27 (SUTURE) ×4
SUT VIC AB 0 CT1 27XBRD ANBCTR (SUTURE) ×2 IMPLANT
TOWEL GREEN STERILE (TOWEL DISPOSABLE) ×4 IMPLANT

## 2022-03-18 NOTE — Discharge Instructions (Signed)
Discharge instructions for Dr. Georgeanna Harrison, M.D., Orthopaedic Surgeon, [place]:  Diet: As you were doing prior to hospitalization, unless instructed otherwise by medical team, dietary/nutrition team, etc. Dressing:  Keep AQUACEL on and dry for up to 14 days.  Do not allow surgical area to get wet before that.  Remove AQUACEL dressing after 14 days and allow area to get wet in shower but DO NOT SUBMERGE until wound is evaluated in clinic.  In most cases skin glue is used and no additional dressing is necessary.  Shower:  Unless otherwise specified, may shower but keep the wounds dry, use an occlusive plastic wrap, NO SOAKING IN TUB.  If the bandage gets wet, change with a clean dry gauze.  Unless otherwise specified, after 5 days dressing(s) should be removed (7 days for PREVENA dressings) and wound(s) may get wet in the shower by allowing water to gently run over.  Again, no soaking in tub, and do NOT submerge for at least 2 weeks!!! Activity:  Increase activity slowly as tolerated.  If you right leg is injured or immobilized, no driving for 6 weeks or until discussed with your surgeon.  If you have an injury or immobilization of the left lower extremity you may not operate a clutch. Please note that driving with any kind of immobilization for the upper extremity (sling, shoulder brace, splint, cast, etc.) may also be considered impaired driving and should not be attempted. Weight Bearing: WEIGHTBEARING AS TOLERATED (WBAT) on the LEFT LOWER EXTREMITY. To prevent constipation: You may use over-the-counter stool softener(s) such as Colace (over the counter) 100 mg by mouth twice a day and/or Miralax (over the counter) for constipation as needed.  Drink plenty of fluids (prune juice may be helpful) and high fiber foods.  Itching:  If you experience itching with your medications, try taking only a single pain pill, or even half a pain pill at a time.  You can also use  benadryl over the counter for itching or also to help with sleep.  Precautions:  If you experience chest pain or shortness of breath - call 911 immediately for transfer to the hospital emergency department!! Medications: Please contact the clinic during office hours (Monday through Friday, 0800 to 1600) if you need a refill on any medications.  Please monitor medications and allow 24 to 48 hours to process refill request!!!!  Please note that only medications directly related to the surgery can be prescribed.  For other medications (e.g. blood pressure medicines, sleeping medicines, etc.), please contact the prescribing physician or your primary care provider. DVT Prophylaxis: Lovenox x6 weeks postop  If you develop a fever greater that 101.1 deg F, purulent drainage from wound, increased redness or drainage from wound, or calf pain -- Call the office at [phone].

## 2022-03-18 NOTE — Assessment & Plan Note (Signed)
Stable

## 2022-03-18 NOTE — Anesthesia Procedure Notes (Signed)
Anesthesia Regional Block: Peng block   Pre-Anesthetic Checklist: , timeout performed,  Correct Patient, Correct Site, Correct Laterality,  Correct Procedure, Correct Position, site marked,  Risks and benefits discussed,  Surgical consent,  Pre-op evaluation,  At surgeon's request and post-op pain management  Laterality: Left and Lower  Prep: chloraprep       Needles:  Injection technique: Single-shot      Needle Length: 9cm  Needle Gauge: 22     Additional Needles: Arrow StimuQuik ECHO Echogenic Stimulating PNB Needle  Procedures:,,,, ultrasound used (permanent image in chart),,    Narrative:  Start time: 03/18/2022 9:46 AM End time: 03/18/2022 9:56 AM Injection made incrementally with aspirations every 5 mL.  Performed by: Personally  Anesthesiologist: Oleta Mouse, MD

## 2022-03-18 NOTE — Assessment & Plan Note (Signed)
Stable. Continue neurontin 300 mg qhs.

## 2022-03-18 NOTE — Assessment & Plan Note (Signed)
Stable. S/p PPM.

## 2022-03-18 NOTE — ED Notes (Signed)
Pt going to pacu

## 2022-03-18 NOTE — Anesthesia Postprocedure Evaluation (Signed)
Anesthesia Post Note  Patient: EMMAMAE MCNAMARA  Procedure(s) Performed: INTRAMEDULLARY (IM) NAIL INTERTROCHANTRIC (Left)     Patient location during evaluation: PACU Anesthesia Type: Spinal Level of consciousness: oriented and awake and alert Pain management: pain level controlled Vital Signs Assessment: post-procedure vital signs reviewed and stable Respiratory status: spontaneous breathing, respiratory function stable and patient connected to nasal cannula oxygen Cardiovascular status: blood pressure returned to baseline and stable Postop Assessment: no headache, no backache, no apparent nausea or vomiting, spinal receding and patient able to bend at knees Anesthetic complications: no   No notable events documented.  Last Vitals:  Vitals:   03/18/22 2011 03/18/22 2034  BP: (!) 128/50 (!) 133/48  Pulse: 66 63  Resp: 20   Temp:  36.7 C  SpO2: 100% 99%    Last Pain:  Vitals:   03/18/22 2034  TempSrc: Oral  PainSc:                  [employer],W. EDMOND

## 2022-03-18 NOTE — Consult Note (Signed)
Orthopaedic Consult  Date/Time: 03/18/22 3:42 PM  Patient Name: [name]  Attending Physician: [place] Eric J, DO    ASSESSMENT & PLAN  Orthopaedic Assessment: 81 y.o. female with left intertrochanteric fracture.  Reductions/Procedures/Splinting/Anesthesia Performed: Reductions: None Splinting/casting: None Procedure(s): None Anesthesia: N/A  Plan: Both operative and nonoperative treatments were discussed with the patient.  Given the morbidity associated with nonoperative treatment and prolonged immobilization, as well as the patient's functional status prior to the injury, she was recommended to undergo surgical treatment with internal fixation of the fracture. Risks of the proposed surgical treatment were discussed with the patient, including bleeding, wound healing complications, infection, damage to surrounding structures, persistent pain, stiffness, lack of improvement, potential for subsequent arthritis or worsening of pre-existing arthritis, nonunion, malunion, and need for further surgery, as well as complications related to anesthesia, cardiovascular complications, and death.  All questions were answered to the patient's satisfaction.  She understands all of this and wishes to proceed with surgery.   Georgeanna Harrison M.D. Orthopaedic Surgery [facility]   Medical Decision Making  Amount/complexity of data: Is there a pathologic fracture (e.g. neoplastic, osteoporotic insufficiency fracture)? Yes Independent interpretation of radiographic studies: Yes Review of radiology results (e.g. reports): Yes Tests ordered (e.g. additional radiographic studies, labs): Yes Lab results reviewed: Yes Reviewed old records: Yes History from another source (independent historian, e.g. family/friend/etc.): Yes Risk: Patient receiving IV controlled substances for pain: Yes Fracture requiring manipulation: No Urgent or emergent (non-elective) surgery likely  this admission: Yes Presence of medical comorbidities and/or surgical risk factors (e.g. current smoker, CAD, diabetes, COPD, CKD, etc.): Yes Closed fracture management WITHOUT manipulation: No Urgent minor procedure (e.g. joint aspiration, compartment pressure measurement, etc.): No Will likely need surgery as an outpatient: No     HPI [name] is a 81 y.o. female. Orthopaedic consultation has specifically been requested to address this patient's current musculoskeletal presentation. She sustained a fall and had immediate pain in the left hip/thigh.  Pain is sharp and severe.  Pain is worse movement and better with rest and immobilization.  Since the fall the patient was not able to ambulate or bear weight due to pain.  At baseline the patient is ambulatory with a walker.  She denies additional injuries.  She does endorse baseline diminished sensation distally, related to neuropathy related to diabetes.   PM[facility] Past Medical History:  Diagnosis Date   Acute medial meniscus tear of right knee 07/02/2014   Acute renal failure,admitted 01/03/12, after admission for diastolic chf 56/31/4970   Anemia    Anxiety    Arthritis    "in my hands; knees, back" (09/27/2018)   CAD (coronary artery disease)    a. 40-59% bilaterally 10/2015.   Chronic diastolic CHF (congestive heart failure) ([place])    a. 05/2016 Echo: EF 60-65%, no rwma, Gr1 DD, Ao sclerosis w/o stenosis, triv MR;  b. 07/2016 TEE: EF 55-60%, no rwma, mild MR.   Chronic headaches    CKD (chronic kidney disease), stage III (HCC)    Coronary artery disease    a. 02/2007 Persantine MV: low risk;  b. 11/2011 CABG x 3 (LIMA->LAD, VG->OM, VG->RCA);  c. 05/2016 MV: EF >65%, no isch/infarct, horiz ST dep in I, II, V5-V6.   Depression    Diverticulosis    Esophageal stricture    GERD (gastroesophageal reflux disease)    Hemorrhoids    Hiatal hernia    Hyperkalemia    a. ARB stopped due to this.  Hyperlipidemia    Hypertension     Hypertensive heart disease    Hypothyroidism    Major depressive disorder with anxious distress 09/05/2019   Mild cognitive impairment    a. seen by neurology.   Mild vascular neurocognitive disorder 09/05/2019   Myocardial infarction ([place]) 12/07/2011   NSTEMI (non-ST elevated myocardial infarction) ([place]) 12/01/2011   PAF (paroxysmal atrial fibrillation) ([place])    a. post-op CABG.   Pain    RIGHT KNEE PAIN - TORN RIGHT MEDIAL MENISCUS   Paroxysmal atrial flutter ([place])    a. 07/2016 s/p TEE & DCCV;  b. 07/2016 Recurrent PAFlutter req initiation of amio & PPM in setting of tachy-brady;  c. CHA2DS2VASc = 7-->Xarelto 15 mg QD.   Pneumonia    "twice" (09/27/2018)   PONV (postoperative nausea and vomiting)    Presence of permanent cardiac pacemaker 08/12/2016   S/P CABG (coronary artery bypass graft), 12/04/11 12/07/2011   LIMA to LAD, SVG to OM, SVG to RCA   Sinus bradycardia    a. not on BB due to this.   Skin cancer    "face" (09/27/2018)   Tachy-brady syndrome ([place])    a. 07/2016 Jxnl brady following DCCV, recurrent Aflutter-->amio + SJM 2272 Assurity MRI DC PPM (ser # [id number]).   Type II diabetes mellitus (HCC)      [place] Past Surgical History:  Procedure Laterality Date   ABDOMINAL HYSTERECTOMY  1980's   ANKLE FRACTURE SURGERY Right    "put pins both side right ankle"   BACK SURGERY  2006   "cyst growing near my spine"   CARDIAC CATHETERIZATION  12/02/2011   mild LV dysfunction with mod hypocontractility of mid-distal anterolateral wall; CAD w/ostial tapering of L [place] with 50% diffuse ostial narrowing of LAD, 99% eccentric focal prox LAD stenosis followed by 70% prox LAD stenosis after 1st diag, 20% mid LAD narrowing; 80% ostial-to-prox L Cfx stenosis & 40-50% irregularity of RCA (Dr. Corky Downs)   CARDIOVERSION N/A 08/11/2016   Procedure: CARDIOVERSION;  Surgeon: Lelon Perla, MD;  Location: [facility]COPY;  Service: Cardiovascular;  Laterality: N/A;   CATARACT EXTRACTION W/  INTRAOCULAR LENS  IMPLANT, BILATERAL Bilateral ~ 2010   [place] GRAFT  12/04/2011   Procedure: CORONARY ARTERY BYPASS GRAFTING (CABG);  Surgeon: Tharon Aquas Adelene Idler, MD;  Location: [facility];  Service: Open Heart Surgery;  Laterality: N/A;  CABG x three,  using left internal mammary artery, and right leg greater saphenous vein harvested endoscopically   CORONARY STENT INTERVENTION N/A 09/27/2018   Procedure: CORONARY STENT INTERVENTION;  Surgeon: Troy Sine, MD;  Location: [place] [facility] CV LAB;  Service: Cardiovascular;  Laterality: N/A;   DILATION AND CURETTAGE OF UTERUS     "a couple times"   EP IMPLANTABLE DEVICE N/A 08/12/2016   Procedure: Pacemaker Implant;  Surgeon: Will Meredith Leeds, MD;  Location: [place] CV LAB;  Service: Cardiovascular;  Laterality: N/A;   ESOPHAGOGASTRODUODENOSCOPY (EGD) WITH ESOPHAGEAL DILATION     FRACTURE SURGERY     JOINT REPLACEMENT     KNEE ARTHROSCOPY WITH MEDIAL MENISECTOMY Right 07/02/2014   Procedure: RIGHT KNEE ARTHROSCOPY WITH PARTIAL MEDIAL MENISTECTOMY, ABRASION CONDROPLASTYU OF PATELLA,ABRASION CONDROPLASTY OF MEDIAL FEMEROL CONDYL, MICROFRACTURE OF MEDIAL FEMEROL CONDYL;  Surgeon: Tobi Bastos, MD;  Location: [facility];  Service: Orthopedics;  Laterality: Right;   LEFT HEART CATH AND CORS/GRAFTS ANGIOGRAPHY N/A 09/06/2018   Procedure: LEFT HEART CATH AND CORS/GRAFTS ANGIOGRAPHY;  Surgeon:  Troy Sine, MD;  Location: [place] CV LAB;  Service: Cardiovascular;  Laterality: N/A;   LEFT HEART CATHETERIZATION WITH CORONARY ANGIOGRAM N/A 12/02/2011   Procedure: LEFT HEART CATHETERIZATION WITH CORONARY ANGIOGRAM;  Surgeon: Troy Sine, MD;  Location: [facility] C[facility] LAB;  Service: Cardiovascular;  Laterality: N/A;  Coronary angiogram, possible PCI   TEE WITHOUT CARDIOVERSION N/A 08/11/2016   Procedure: TRANSESOPHAGEAL ECHOCARDIOGRAM (TEE);  Surgeon: Lelon Perla, MD;  Location: [facility] ENDOSCOPY;  Service:  Cardiovascular;  Laterality: N/A;   TEE WITHOUT CARDIOVERSION N/A 05/18/2019   Procedure: TRANSESOPHAGEAL ECHOCARDIOGRAM (TEE);  Surgeon: [name], MD;  Location: [facility] ENDOSCOPY;  Service: Cardiovascular;  Laterality: N/A;   [place] Left ~ 2006   TRANSTHORACIC ECHOCARDIOGRAM  02/19/2013   EF 57-32%, grade 1 diastolic dysfunction; mildly thickend/calcified AV leaflets; mildly calcidied MV annulus; mild TR   Home Medications Prior to Admission medications   Medication Sig Start Date End Date Taking? Authorizing Provider  acetaminophen (TYLENOL) 650 MG CR tablet Take 650 mg by mouth every 8 (eight) hours as needed for pain.   Yes [provider]  allopurinol (ZYLOPRIM) 100 MG tablet Take 200 mg by mouth daily.   Yes [provider]  amLODipine (NORVASC) 5 MG tablet Take 1 tablet (5 mg total) by mouth daily. 09/14/21 03/18/22 Yes Hilty, Nadean Corwin, MD  aspirin EC 81 MG tablet Take 1 tablet (81 mg total) by mouth daily. 01/12/22  Yes [place] Eric J, DO  carvedilol (COREG) 25 MG tablet Take 1 tablet (25 mg total) by mouth 2 (two) times daily. 02/12/22 03/18/22 Yes Dana Allan I, MD  diclofenac Sodium (VOLTAREN) 1 % GEL Apply 2 g topically 2 (two) times daily as needed (pain).   Yes [provider]  docusate sodium (COLACE) 100 MG capsule Take 100 mg by mouth in the morning and at bedtime.   Yes [provider]  FEROSUL 325 (65 Fe) MG tablet Take 325 mg by mouth 2 (two) times a week. No set days 04/21/21  Yes [provider]  gabapentin (NEURONTIN) 300 MG capsule Take 1 capsule (300 mg total) by mouth at bedtime. 05/22/19  Yes Roney Jaffe, MD  insulin glargine (LANTUS) 100 UNIT/ML injection Inject 0.2 mLs (20 Units total) into the skin daily. 02/13/22 05/24/22 Yes Bonnell Public, MD  isosorbide mononitrate (IMDUR) 30 MG 24 hr tablet Take 1 tablet (30 mg total) by mouth daily. 03/01/22  Yes Hilty, Nadean Corwin, MD   meclizine (ANTIVERT) 25 MG tablet Take 25 mg by mouth daily as needed for dizziness.   Yes [provider]  nitroGLYCERIN (NITROSTAT) 0.4 MG SL tablet Place 1 tablet (0.4 mg total) under the tongue every 5 (five) minutes as needed for chest pain (max 3 doses). 02/11/20 03/18/22 Yes Hilty, Nadean Corwin, MD  pantoprazole (PROTONIX) 40 MG tablet Take 40 mg by mouth daily before breakfast. 06/24/20  Yes [provider]  Polyethyl Glycol-Propyl Glycol (SYSTANE OP) Place 1 drop into both eyes 2 (two) times daily as needed (dry eyes).   Yes [provider]  polyethylene glycol (MIRALAX / GLYCOLAX) 17 g packet Take 17 g by mouth daily. Patient taking differently: Take 17 g by mouth daily as needed for mild constipation. 02/13/22  Yes Dana Allan I, MD  rosuvastatin (CRESTOR) 10 MG tablet Take 10 mg by mouth at bedtime. 01/28/21  Yes [provider]  sertraline (ZOLOFT) 25 MG tablet Take 25 mg by mouth  daily. 02/08/22  Yes [provider]  SYNTHROID 100 MCG tablet Take 100 mcg by mouth at bedtime. 03/26/21  Yes [provider]  torsemide (DEMADEX) 20 MG tablet Take 2 tablets by mouth once daily Patient taking differently: Take 40 mg by mouth daily. 01/25/22  Yes Hilty, Nadean Corwin, MD     Allergies Allergies  Allergen Reactions   Clonidine Derivatives Other (See Comments)    Bradycardia and fatigue    Clonidine Hcl Other (See Comments)    Bradycardia   Crestor [Rosuvastatin] Other (See Comments)    Made the patient feel tired/weak   Losartan Potassium Other (See Comments)    Hyperkalemia   Sulfa Antibiotics Other (See Comments)    Childhood reaction not recalled   Epinephrine Other (See Comments)    Abnormal feeling. Dental exam/injection of local w/ epi.   Hydralazine Hcl Anxiety and Other (See Comments)    Nervousness, anxiousness, GI upset     Family History Family History  Problem Relation Age of Onset   Diabetes Mother    CVA Mother     Hypertension Mother    Heart disease Father    Hyperlipidemia Father    Breast cancer Sister    Breast cancer Sister        x 3   Heart disease Brother        x5; one with MI   Heart disease Sister        x3   Diabetes Sister        x3   Lung cancer Sister    Breast cancer Sister    Colon cancer Neg Hx     Social History Social History   Socioeconomic History   Marital status: Widowed    Spouse name: Not on file   Number of children: 2   Years of education: 12   Highest education level: High school graduate  Occupational History   Occupation: retired  Tobacco Use   Smoking status: Never   Smokeless tobacco: Never  Scientific laboratory technician Use: Never used  Substance and Sexual Activity   Alcohol use: Not Currently    Comment: very occasional    Drug use: Never   Sexual activity: Not Currently    Birth control/protection: Surgical    Comment: Hysterectomy  Other Topics Concern   Not on file  Social History Narrative   2 children, 1 daughter and 1 adopted son   Social Determinants of Health   Financial Resource Strain: Not on file  Food Insecurity: Not on file  Transportation Needs: Not on file  Physical Activity: Not on file  Stress: Not on file  Social Connections: Not on file  Intimate Partner Violence: Not on file     Review of Systems MSK: As noted per HPI above GI: No current Nausea/vomiting ENT: Denies sore throat, epistaxis CV: Denies chest pain  Resp: No current shortness of breath  Other than mentioned above, there are no Constitutional, Neurological, Psychiatric, ENT, Ophthalmological, Cardiovascular, Respiratory, GI, GU, Musculoskeletal, Integumentary, Lymphatic, Endocrine or Allergic issues.     Imaging  Independent interpretation of orthopaedic-relevant films: Multiple radiographic views of the left hip and pelvis demonstrate a left intertrochanteric fracture.  Radiographic results: CT Head Wo Contrast  Result Date: 03/18/2022 CLINICAL  DATA:  Polytrauma, blunt.  Fall, head injury EXAM: CT HEAD WITHOUT CONTRAST CT CERVICAL SPINE WITHOUT CONTRAST TECHNIQUE: Multidetector CT imaging of the head and cervical spine was performed following the standard protocol without intravenous contrast.  Multiplanar CT image reconstructions of the cervical spine were also generated. RADIATION DOSE REDUCTION: This exam was performed according to the departmental dose-optimization program which includes automated exposure control, adjustment of the mA and/or kV according to patient size and/or use of iterative reconstruction technique. COMPARISON:  None Available. FINDINGS: CT HEAD FINDINGS Brain: Normal anatomic configuration. Parenchymal volume loss is commensurate with the patient's age. Mild periventricular white matter changes are present likely reflecting the sequela of small vessel ischemia. No abnormal intra or extra-axial mass lesion or fluid collection. No abnormal mass effect or midline shift. No evidence of acute intracranial hemorrhage or infarct. Ventricular size is normal. Cerebellum unremarkable. Vascular: No asymmetric hyperdense vasculature at the skull base. Skull: Intact Sinuses/Orbits: Paranasal sinuses are clear. Orbits are unremarkable. Other: Mastoid air cells and middle ear cavities are clear. CT CERVICAL SPINE FINDINGS Alignment: Normal. Skull base and vertebrae: Craniocervical alignment is normal. Atlantodental interval is not widened. No acute fracture of the cervical spine. Vertebral body height is preserved. Soft tissues and spinal canal: No canal hematoma. No prevertebral soft tissue swelling or paravertebral fluid collection. Extensive atherosclerotic calcification within the carotid bifurcations. No pathologic cervical adenopathy. Disc levels: Intervertebral disc space narrowing and endplate remodeling at R7-4 in keeping with changes of advanced degenerative disc disease. Mild degenerative changes are seen throughout the remainder of  the cervical spine. Prevertebral soft tissues are not thickened on sagittal reformats. The spinal canal is widely patent. No significant neuroforaminal narrowing. Upper chest: Unremarkable Other: None IMPRESSION: No acute intracranial injury.  No calvarial fracture. No acute fracture or listhesis of the cervical spine. Peripheral vascular disease with extensive atherosclerotic calcification within the carotid bifurcations. Electronically Signed   By: Fidela Salisbury M.D.   On: 03/18/2022 01:59   CT Cervical Spine Wo Contrast  Result Date: 03/18/2022 CLINICAL DATA:  Polytrauma, blunt.  Fall, head injury EXAM: CT HEAD WITHOUT CONTRAST CT CERVICAL SPINE WITHOUT CONTRAST TECHNIQUE: Multidetector CT imaging of the head and cervical spine was performed following the standard protocol without intravenous contrast. Multiplanar CT image reconstructions of the cervical spine were also generated. RADIATION DOSE REDUCTION: This exam was performed according to the departmental dose-optimization program which includes automated exposure control, adjustment of the mA and/or kV according to patient size and/or use of iterative reconstruction technique. COMPARISON:  None Available. FINDINGS: CT HEAD FINDINGS Brain: Normal anatomic configuration. Parenchymal volume loss is commensurate with the patient's age. Mild periventricular white matter changes are present likely reflecting the sequela of small vessel ischemia. No abnormal intra or extra-axial mass lesion or fluid collection. No abnormal mass effect or midline shift. No evidence of acute intracranial hemorrhage or infarct. Ventricular size is normal. Cerebellum unremarkable. Vascular: No asymmetric hyperdense vasculature at the skull base. Skull: Intact Sinuses/Orbits: Paranasal sinuses are clear. Orbits are unremarkable. Other: Mastoid air cells and middle ear cavities are clear. CT CERVICAL SPINE FINDINGS Alignment: Normal. Skull base and vertebrae: Craniocervical  alignment is normal. Atlantodental interval is not widened. No acute fracture of the cervical spine. Vertebral body height is preserved. Soft tissues and spinal canal: No canal hematoma. No prevertebral soft tissue swelling or paravertebral fluid collection. Extensive atherosclerotic calcification within the carotid bifurcations. No pathologic cervical adenopathy. Disc levels: Intervertebral disc space narrowing and endplate remodeling at Y8-1 in keeping with changes of advanced degenerative disc disease. Mild degenerative changes are seen throughout the remainder of the cervical spine. Prevertebral soft tissues are not thickened on sagittal reformats. The spinal canal is widely patent. No significant neuroforaminal  narrowing. Upper chest: Unremarkable Other: None IMPRESSION: No acute intracranial injury.  No calvarial fracture. No acute fracture or listhesis of the cervical spine. Peripheral vascular disease with extensive atherosclerotic calcification within the carotid bifurcations. Electronically Signed   By: Fidela Salisbury M.D.   On: 03/18/2022 01:59   DG Humerus Left  Result Date: 03/18/2022 CLINICAL DATA:  Fall with deformity. EXAM: LEFT HUMERUS - 2+ VIEW COMPARISON:  11/23/2016, 02/10/2022. FINDINGS: There is deformity of the left humeral head/neck which is improved from the prior exam and likely related to old trauma. No acute fracture or dislocation is seen. Degenerative changes are present at the acromioclavicular joint. IMPRESSION: Bony deformity of the left humeral head/neck, unchanged from the previous exam and likely related to old trauma. No acute fracture is identified. Electronically Signed   By: Brett Fairy M.D.   On: 03/18/2022 00:43   CUP PACEART REMOTE DEVICE CHECK  Result Date: 02/17/2022 Scheduled remote reviewed. Normal device function.  9 AMS episodes showing AT/AF, longest duration 2hrs 73mn, controlled ventricular rates Burden <1%, Xarelto per 3/31 ov note, not on MAR, Coreg  Next remote 91 days. LA  DG Hip Unilat With Pelvis 2-3 Views Left  Result Date: 03/18/2022 CLINICAL DATA:  Fall, with deformity. EXAM: LEFT FEMUR 1 VIEW; DG HIP (WITH OR WITHOUT PELVIS) 2-3V LEFT COMPARISON:  05/17/2019. FINDINGS: There is a comminuted intertrochanteric fracture of the left hip with mild lateral angulation and superior subluxation of the distal fracture fragment. No dislocation is seen. The remaining bony structures appear intact. Total knee arthroplasty changes are noted on the left without evidence of hardware loosening. Mild degenerative changes are noted in the lower lumbar spine. Vascular calcifications are noted in the pelvis and lower extremities bilaterally. IMPRESSION: Comminuted angulated intratrochanteric fracture of the proximal left femur. Electronically Signed   By: LBrett FairyM.D.   On: 03/18/2022 00:39   DG Femur 1V Left  Result Date: 03/18/2022 CLINICAL DATA:  Fall, with deformity. EXAM: LEFT FEMUR 1 VIEW; DG HIP (WITH OR WITHOUT PELVIS) 2-3V LEFT COMPARISON:  05/17/2019. FINDINGS: There is a comminuted intertrochanteric fracture of the left hip with mild lateral angulation and superior subluxation of the distal fracture fragment. No dislocation is seen. The remaining bony structures appear intact. Total knee arthroplasty changes are noted on the left without evidence of hardware loosening. Mild degenerative changes are noted in the lower lumbar spine. Vascular calcifications are noted in the pelvis and lower extremities bilaterally. IMPRESSION: Comminuted angulated intratrochanteric fracture of the proximal left femur. Electronically Signed   By: LBrett FairyM.D.   On: 03/18/2022 00:39   Labs  Recent Labs    03/18/22 0040  WBC 11.1*  HGB 10.4*  HCT 30.1*  PLT 155   Recent Labs    03/18/22 0040  NA 139  K 4.3  CL 104  CO2 24  BUN 58*  CREATININE 2.43*  GLUCOSE 68*  CALCIUM 9.0   Lab Results  Component Value Date   INR 1.1 02/04/2021   INR 2.2  (H) 05/15/2019   INR 1.34 12/04/2011        Physical Examination  Patient is a 81y.o. year old female who is alert, well appearing, and in no distress, mood is calm.  Orientation: oriented to person, place, time, and general circumstances  Vital Signs: BP (!) 156/63   Pulse 62   Temp 98.7 F (37.1 C) (Oral)   Resp 16   Ht 5' (1.524 m)   Wt 57.8  kg   SpO2 97%   BMI 24.89 kg/m    Gait: Unable to ambulate due to injury.  Supine on stretcher.  Heart: Normal rate Lungs: Non-labored breathing Abdomen: Soft, Non-tender   Right Upper Extremity: Inspection: Atraumatic Palpation: Nontender ROM: Full, painless Joint Stability: No instability Strength: Normal Skin: Intact Peripheral Vascular: Well perfused Reflexes: No pathologic Sensation: Intact to light touch distally Lymph Nodes: None Palpable Coordination: Intact, normal   Left Upper Extremity: Inspection: Atraumatic Palpation: Nontender ROM: Full, painless Joint Stability: No instability Strength: Normal Skin: Intact Peripheral Vascular: Well perfused Reflexes: No pathologic Sensation: Intact to light touch distally Lymph Nodes: None Palpable Coordination: Intact, normal    Right Lower Extremity: Inspection: Atraumatic Palpation: Nontender ROM: Full, painless Joint Stability: No instability Strength: Normal Skin: Intact Peripheral Vascular: Well perfused Reflexes: No pathologic Sensation: Slightly diminished to light touch distally, mostly in the tibial distribution, but to a lesser extent in the superficial and deep peroneal distributions, and less than 1 left side Lymph Nodes: None Palpable Coordination: Intact, normal   Left Lower Extremity: Inspection: Hip held in flexion and external rotation Palpation: Tender to palpation over site of known intertrochanteric fracture ROM: Hip range of motion severely limited by pain and injury Joint Stability: No instability of knee or ankle Strength: Normal  dorsiflexion, plantarflexion, and EHL strength and function Skin: Intact Peripheral Vascular: Normal distal pulses, warm and well-perfused Reflexes: No pathologic Sensation: Intact to light touch to baseline, with diminished sensation throughout, tibial distribution most affected, to a lesser extent superficial and deep peroneal distributions Lymph Nodes: None Palpable Coordination: Limited by pain and injury    Pelvis: Skin: Intact Palpation: Nontender Stability: No instability      The review of the patient's medications does not in any way constitute an endorsement, by this clinician,  of their use, dosage, indications, route, efficacy, interactions, or other clinical parameters.  This note was generated within the [employer] EMR using Dragon medical speech recognition software and may contain inherent errors or omissions not intended by the user. Grammatical and punctuation errors, random word insertions, deletions, pronoun errors and incomplete sentences are occasional consequences of this technology due to software limitations. Not all errors are caught or corrected.  Although every attempt is made to root out erroneus and incomplete transcription, the note may still not fully represent the intent or opinion of the author. If there are questions or concerns about the content of this note or information contained within the body of this dictation they should be addressed directly with the author for clarification.

## 2022-03-18 NOTE — Assessment & Plan Note (Addendum)
Stable. Continue crestor 10 mg qhs. Check lipid panel

## 2022-03-18 NOTE — Assessment & Plan Note (Addendum)
Stable. Continue lantus 20 units. Add SSI. Check A1c.

## 2022-03-18 NOTE — Assessment & Plan Note (Signed)
Stable. Continue asa 81 mg.

## 2022-03-18 NOTE — Anesthesia Preprocedure Evaluation (Addendum)
Anesthesia Evaluation  Patient identified by MRN, date of birth, ID band Patient awake    Reviewed: Allergy & Precautions, NPO status , Patient's Chart, lab work & pertinent test results, reviewed documented beta blocker date and time   History of Anesthesia Complications (+) PONV  Airway Mallampati: II  TM Distance: >3 FB Neck ROM: Full    Dental  (+) Edentulous Upper, Missing, Dental Advisory Given   Pulmonary pneumonia (march 2023), resolved,    breath sounds clear to auscultation       Cardiovascular hypertension, Pt. on medications and Pt. on home beta blockers (-) angina+ CAD and + CABG  + dysrhythmias Atrial Fibrillation + pacemaker  Rhythm:Irregular Rate:Normal  '22 ECHO: EF 55 to 60%. The LV has normal function, no regional wall motion abnormalities. There is mild LVH.  Grade II diastolic dysfunction (pseudonormalization).  Elevated left atrial pressure.  2. Right ventricular systolic function is mildly reduced. The right ventricular size is normal. There is mildly elevated pulmonary artery systolic pressure. The estimated right ventricular systolic pressure is 47.4 mmHg.  3. Left atrial size was mildly dilated.  4. The mitral valve is normal in structure. Mild MR.  5. Mild to moderate TR 6. The aortic valve is tricuspid.   Neuro/Psych Anxiety Depression Peripheral neuropathy    GI/Hepatic Neg liver ROS, hiatal hernia, GERD  Medicated and Controlled,  Endo/Other  diabetes (glu 140), Insulin DependentHypothyroidism   Renal/GU Renal InsufficiencyRenal disease     Musculoskeletal  (+) Arthritis , Osteoarthritis,    Abdominal   Peds  Hematology  (+) Blood dyscrasia (Hb 10.4), anemia ,   Anesthesia Other Findings   Reproductive/Obstetrics                         Anesthesia Physical Anesthesia Plan  ASA: 3  Anesthesia Plan: Spinal   Post-op Pain Management: Tylenol PO (pre-op)*    Induction:   PONV Risk Score and Plan: 3 and Ondansetron, Dexamethasone and Treatment may vary due to age or medical condition  Airway Management Planned: Natural Airway and Simple Face Mask  Additional Equipment: None  Intra-op Plan:   Post-operative Plan:   Informed Consent: I have reviewed the patients History and Physical, chart, labs and discussed the procedure including the risks, benefits and alternatives for the proposed anesthesia with the patient or authorized representative who has indicated his/her understanding and acceptance.   Patient has DNR.  Discussed DNR with patient, Discussed DNR with power of attorney and Suspend DNR.   Dental advisory given and Consent reviewed with POA  Plan Discussed with: CRNA and Surgeon  Anesthesia Plan Comments: (Pt had PENG block earlier in day, plan routine monitors, SAB)      Anesthesia Quick Evaluation

## 2022-03-18 NOTE — Anesthesia Preprocedure Evaluation (Addendum)
Anesthesia Evaluation  Patient identified by MRN, date of birth, ID band Patient awake    Reviewed: Allergy & Precautions, NPO status , Patient's Chart, lab work & pertinent test results, reviewed documented beta blocker date and time   History of Anesthesia Complications (+) PONV and history of anesthetic complications  Airway Mallampati: II  TM Distance: >3 FB Neck ROM: Full    Dental  (+) Edentulous Upper, Missing, Chipped,    Pulmonary neg pulmonary ROS,    Pulmonary exam normal        Cardiovascular hypertension, Pt. on medications and Pt. on home beta blockers + CAD, + Past MI, + CABG (2013) and +CHF  Normal cardiovascular exam+ dysrhythmias (on Xarelto) Atrial Fibrillation + pacemaker   TTE 05/17/19: EF 55-60%, mildly reduced RV systolic function, mildly elevated RVSP (45mHg), moderate LAE, MR jet centrally-directed, mild-mod TR     Neuro/Psych  Headaches, Anxiety Depression Dementia    GI/Hepatic Neg liver ROS, hiatal hernia, GERD  Controlled and Medicated,  Endo/Other  diabetes, Type 2, Insulin DependentHypothyroidism   Renal/GU Renal InsufficiencyRenal disease (Cr 1.73)     Musculoskeletal  (+) Arthritis ,   Abdominal   Peds  Hematology  (+) Blood dyscrasia, anemia , Hgb 7.4, plts 107   Anesthesia Other Findings   Reproductive/Obstetrics                             Anesthesia Physical  Anesthesia Plan  ASA: III  Anesthesia Plan: Regional   Post-op Pain Management: Regional block* and Minimal or no pain anticipated   Induction: Intravenous  PONV Risk Score and Plan: 3 and Treatment may vary due to age or medical condition  Airway Management Planned: Natural Airway  Additional Equipment:   Intra-op Plan:   Post-operative Plan:   Informed Consent: I have reviewed the patients History and Physical, chart, labs and discussed the procedure including the risks, benefits  and alternatives for the proposed anesthesia with the patient or authorized representative who has indicated his/her understanding and acceptance.     Dental advisory given  Plan Discussed with:   Anesthesia Plan Comments:         Anesthesia Quick Evaluation

## 2022-03-18 NOTE — Progress Notes (Signed)
PHARMACY NOTE:  ANTIMICROBIAL RENAL DOSAGE ADJUSTMENT  Current antimicrobial regimen includes a mismatch between antimicrobial dosage and estimated renal function.  As per policy approved by the Pharmacy & Therapeutics and Medical Executive Committees, the antimicrobial dosage will be adjusted accordingly.  Current antimicrobial dosage:  Cefazolin 2gm IV q6h x 2 doses  Indication: surgical prophylaxis  Renal Function:  Estimated Creatinine Clearance: 14.2 mL/min (A) (by C-G formula based on SCr of 2.43 mg/dL (H)). '[]'$      On intermittent HD, scheduled: '[]'$      On CRRT    Antimicrobial dosage has been changed to:  Cefazolin 2gm IV x 1 dose ~12 hours post-op  Thank you for allowing pharmacy to be a part of this patient's care.  Arty Baumgartner, [facility] 03/18/2022 8:38 PM

## 2022-03-18 NOTE — H&P (Signed)
History and Physical    [name] MVE:[id number] DOB: 1941/03/08 DOA: 03/17/2022  DOS: the patient was seen and examined on 03/17/2022  PCP: Leeroy Cha, MD   Patient coming from: Home  I have personally briefly reviewed patient's old medical records in [place]  CC: fall at home HPI: 81 year old white female history of type 2 diabetes, sick sinus syndrome status post permanent pacemaker, CKD stage IV, history of paroxysmal atrial fibrillation no longer on anticoagulation due to history of GI bleeding, hyperlipidemia, reflux, diabetic neuropathy, chronic diastolic heart failure presents to the ER today after a fall at home.  Patient states that she was getting ready for bed.  She dropped something on the ground.  She tried to pick it up.  She fell over onto her right side.  She had pain in her left hip.  Patient brought to the ER via ambulance.  Labs showed a BUN of 58, creatinine 2.43.  White count 11.1, hemoglobin 10.4, platelets 155  CT head without acute intracranial abnormality, CT spine without acute cervical fracture.  Left hip x-rays demonstrated a comminuted intertrochanteric fracture of the left femur with mild lateral angulation and superior subluxation.  EKG which I personally reviewed shows normal sinus rhythm.  EDP is contacted orthopedics.  [facility] hospitalist contacted for admission.   ED Course: Imaging shows left intertrochanteric fracture.  Labs are stable at baseline for her.  Review of Systems:  Review of Systems  Constitutional: Negative.   HENT: Negative.    Eyes: Negative.   Respiratory: Negative.    Cardiovascular: Negative.   Gastrointestinal: Negative.   Genitourinary: Negative.   Musculoskeletal:  Positive for falls and joint pain.       Left hip pain.  Skin: Negative.   Neurological: Negative.   Endo/Heme/Allergies: Negative.   Psychiatric/Behavioral: Negative.    All other systems reviewed and are negative.  Past  Medical History:  Diagnosis Date   Acute medial meniscus tear of right knee 07/02/2014   Acute renal failure,admitted 01/03/12, after admission for diastolic chf 2/83/6629   Anemia    Anxiety    Arthritis    "in my hands; knees, back" (09/27/2018)   CAD (coronary artery disease)    a. 40-59% bilaterally 10/2015.   Chronic diastolic CHF (congestive heart failure) ([place])    a. 05/2016 Echo: EF 60-65%, no rwma, Gr1 DD, Ao sclerosis w/o stenosis, triv MR;  b. 07/2016 TEE: EF 55-60%, no rwma, mild MR.   Chronic headaches    CKD (chronic kidney disease), stage III (HCC)    Coronary artery disease    a. 02/2007 Persantine MV: low risk;  b. 11/2011 CABG x 3 (LIMA->LAD, VG->OM, VG->RCA);  c. 05/2016 MV: EF >65%, no isch/infarct, horiz ST dep in I, II, V5-V6.   Depression    Diverticulosis    Esophageal stricture    GERD (gastroesophageal reflux disease)    Hemorrhoids    Hiatal hernia    Hyperkalemia    a. ARB stopped due to this.   Hyperlipidemia    Hypertension    Hypertensive heart disease    Hypothyroidism    Major depressive disorder with anxious distress 09/05/2019   Mild cognitive impairment    a. seen by neurology.   Mild vascular neurocognitive disorder 09/05/2019   Myocardial infarction ([place]) 12/07/2011   NSTEMI (non-ST elevated myocardial infarction) ([place]) 12/01/2011   PAF (paroxysmal atrial fibrillation) ([place])    a. post-op CABG.   Pain    RIGHT KNEE PAIN -  TORN RIGHT MEDIAL MENISCUS   Paroxysmal atrial flutter ([place])    a. 07/2016 s/p TEE & DCCV;  b. 07/2016 Recurrent PAFlutter req initiation of amio & PPM in setting of tachy-brady;  c. CHA2DS2VASc = 7-->Xarelto 15 mg QD.   Pneumonia    "twice" (09/27/2018)   PONV (postoperative nausea and vomiting)    Presence of permanent cardiac pacemaker 08/12/2016   S/P CABG (coronary artery bypass graft), 12/04/11 12/07/2011   LIMA to LAD, SVG to OM, SVG to RCA   Sinus bradycardia    a. not on BB due to this.   Skin cancer    "face"  (09/27/2018)   Tachy-brady syndrome ([place])    a. 07/2016 Jxnl brady following DCCV, recurrent Aflutter-->amio + SJM 2272 Assurity MRI DC PPM (ser # [id number]).   Type II diabetes mellitus ([place])     Past Surgical History:  Procedure Laterality Date   ABDOMINAL HYSTERECTOMY  1980's   ANKLE FRACTURE SURGERY Right    "put pins both side right ankle"   BACK SURGERY  2006   "cyst growing near my spine"   CARDIAC CATHETERIZATION  12/02/2011   mild LV dysfunction with mod hypocontractility of mid-distal anterolateral wall; CAD w/ostial tapering of L [place] with 50% diffuse ostial narrowing of LAD, 99% eccentric focal prox LAD stenosis followed by 70% prox LAD stenosis after 1st diag, 20% mid LAD narrowing; 80% ostial-to-prox L Cfx stenosis & 40-50% irregularity of RCA (Dr. Corky Downs)   CARDIOVERSION N/A 08/11/2016   Procedure: CARDIOVERSION;  Surgeon: Lelon Perla, MD;  Location: [facility];  Service: Cardiovascular;  Laterality: N/A;   CATARACT EXTRACTION W/ INTRAOCULAR LENS  IMPLANT, BILATERAL Bilateral ~ 2010   [place] GRAFT  12/04/2011   Procedure: CORONARY ARTERY BYPASS GRAFTING (CABG);  Surgeon: Tharon Aquas Adelene Idler, MD;  Location: [facility];  Service: Open Heart Surgery;  Laterality: N/A;  CABG x three,  using left internal mammary artery, and right leg greater saphenous vein harvested endoscopically   CORONARY STENT INTERVENTION N/A 09/27/2018   Procedure: CORONARY STENT INTERVENTION;  Surgeon: Troy Sine, MD;  Location: [facility];  Service: Cardiovascular;  Laterality: N/A;   DILATION AND CURETTAGE OF UTERUS     "a couple times"   EP IMPLANTABLE DEVICE N/A 08/12/2016   Procedure: Pacemaker Implant;  Surgeon: Will Meredith Leeds, MD;  Location: [facility];  Service: Cardiovascular;  Laterality: N/A;   ESOPHAGOGASTRODUODENOSCOPY (EGD) WITH ESOPHAGEAL DILATION     FRACTURE SURGERY     JOINT REPLACEMENT     KNEE ARTHROSCOPY WITH MEDIAL  MENISECTOMY Right 07/02/2014   Procedure: RIGHT KNEE ARTHROSCOPY WITH PARTIAL MEDIAL MENISTECTOMY, ABRASION CONDROPLASTYU OF PATELLA,ABRASION CONDROPLASTY OF MEDIAL FEMEROL CONDYL, MICROFRACTURE OF MEDIAL FEMEROL CONDYL;  Surgeon: Tobi Bastos, MD;  Location: [facility];  Service: Orthopedics;  Laterality: Right;   LEFT HEART CATH AND CORS/GRAFTS ANGIOGRAPHY N/A 09/06/2018   Procedure: LEFT HEART CATH AND CORS/GRAFTS ANGIOGRAPHY;  Surgeon: Troy Sine, MD;  Location: [facility];  Service: Cardiovascular;  Laterality: N/A;   LEFT HEART CATHETERIZATION WITH CORONARY ANGIOGRAM N/A 12/02/2011   Procedure: LEFT HEART CATHETERIZATION WITH CORONARY ANGIOGRAM;  Surgeon: Troy Sine, MD;  Location[facility];  Service: Cardiovascular;  Laterality: N/A;  Coronary angiogram, possible PCI   TEE WITHOUT CARDIOVERSION N/A 08/11/2016   Procedure: TRANSESOPHAGEAL ECHOCARDIOGRAM (TEE);  Surgeon: Lelon Perla, MD;  Location: [facility];  Service: Cardiovascular;  Laterality: N/A;   TEE WITHOUT CARDIOVERSION N/A 05/18/2019   Procedure: TRANSESOPHAGEAL ECHOCARDIOGRAM (TEE);  Surgeon: [name], MD;  Location: [facility];  Service: Cardiovascular;  Laterality: N/A;   [place] Left ~ 2006   TRANSTHORACIC ECHOCARDIOGRAM  02/19/2013   EF 19-37%, grade 1 diastolic dysfunction; mildly thickend/calcified AV leaflets; mildly calcidied MV annulus; mild TR     reports that she has never smoked. She has never used smokeless tobacco. She reports that she does not currently use alcohol. She reports that she does not use drugs.  Allergies  Allergen Reactions   Clonidine Derivatives Other (See Comments)    Bradycardia and fatigue    Clonidine Hcl Other (See Comments)    Bradycardia   Crestor [Rosuvastatin] Other (See Comments)    Made the patient feel tired/weak   Losartan Potassium Other (See Comments)    Hyperkalemia   Sulfa Antibiotics Other (See Comments)     Childhood reaction not recalled   Epinephrine Other (See Comments)    Abnormal feeling. Dental exam/injection of local w/ epi.   Hydralazine Hcl Anxiety and Other (See Comments)    Nervousness, anxiousness, GI upset    Family History  Problem Relation Age of Onset   Diabetes Mother    CVA Mother    Hypertension Mother    Heart disease Father    Hyperlipidemia Father    Breast cancer Sister    Breast cancer Sister        x 3   Heart disease Brother        x5; one with MI   Heart disease Sister        x3   Diabetes Sister        x3   Lung cancer Sister    Breast cancer Sister    Colon cancer Neg Hx     Prior to Admission medications   Medication Sig Start Date End Date Taking? Authorizing Provider  acetaminophen (TYLENOL) 650 MG CR tablet Take 650 mg by mouth every 8 (eight) hours as needed for pain.   Yes [provider]  allopurinol (ZYLOPRIM) 100 MG tablet Take 200 mg by mouth daily.   Yes [provider]  amLODipine (NORVASC) 5 MG tablet Take 1 tablet (5 mg total) by mouth daily. 09/14/21 03/18/22 Yes Hilty, Nadean Corwin, MD  aspirin EC 81 MG tablet Take 1 tablet (81 mg total) by mouth daily. 01/12/22  Yes [place] Kanishk Stroebel J, DO  carvedilol (COREG) 25 MG tablet Take 1 tablet (25 mg total) by mouth 2 (two) times daily. 02/12/22 03/18/22 Yes Dana Allan I, MD  diclofenac Sodium (VOLTAREN) 1 % GEL Apply 2 g topically 2 (two) times daily as needed (pain).   Yes [provider]  docusate sodium (COLACE) 100 MG capsule Take 100 mg by mouth in the morning and at bedtime.   Yes [provider]  FEROSUL 325 (65 Fe) MG tablet Take 325 mg by mouth 2 (two) times a week. No set days 04/21/21  Yes [provider]  gabapentin (NEURONTIN) 300 MG capsule Take 1 capsule (300 mg total) by mouth at bedtime. 05/22/19  Yes Roney Jaffe, MD  insulin glargine (LANTUS) 100 UNIT/ML injection Inject 0.2 mLs (20 Units total) into the skin daily. 02/13/22  05/24/22 Yes Bonnell Public, MD  isosorbide mononitrate (IMDUR) 30 MG 24 hr tablet Take 1 tablet (30 mg total) by mouth daily. 03/01/22  Yes Hilty, Nadean Corwin, MD  meclizine (ANTIVERT) 25 MG tablet Take 25 mg by mouth daily as needed for dizziness.   Yes [provider]  nitroGLYCERIN (NITROSTAT) 0.4 MG SL tablet Place 1 tablet (0.4 mg total) under the tongue every 5 (five) minutes as needed for chest pain (max 3 doses). 02/11/20 03/18/22 Yes Hilty, Nadean Corwin, MD  pantoprazole (PROTONIX) 40 MG tablet Take 40 mg by mouth daily before breakfast. 06/24/20  Yes [provider]  Polyethyl Glycol-Propyl Glycol (SYSTANE OP) Place 1 drop into both eyes 2 (two) times daily as needed (dry eyes).   Yes [provider]  polyethylene glycol (MIRALAX / GLYCOLAX) 17 g packet Take 17 g by mouth daily. Patient taking differently: Take 17 g by mouth daily as needed for mild constipation. 02/13/22  Yes Dana Allan I, MD  rosuvastatin (CRESTOR) 10 MG tablet Take 10 mg by mouth at bedtime. 01/28/21  Yes [provider]  sertraline (ZOLOFT) 25 MG tablet Take 25 mg by mouth daily. 02/08/22  Yes [provider]  SYNTHROID 100 MCG tablet Take 100 mcg by mouth at bedtime. 03/26/21  Yes [provider]  torsemide (DEMADEX) 20 MG tablet Take 2 tablets by mouth once daily Patient taking differently: Take 40 mg by mouth daily. 01/25/22  Yes [name], MD    Physical Exam: Vitals:   03/18/22 0232 03/18/22 0233 03/18/22 0352 03/18/22 0400  BP:   (!) 134/103 (!) 158/49  Pulse: 62 60 60 (!) 59  Resp: '14 14 17 14  '$ Temp:      TempSrc:      SpO2: 100% 99% 99% 98%  Weight:      Height:        Physical Exam Vitals and nursing note reviewed.  Constitutional:      General: She is not in acute distress.    Appearance: Normal appearance. She is not ill-appearing, toxic-appearing or diaphoretic.  HENT:     Head: Normocephalic and atraumatic.     Nose: Nose normal. No  rhinorrhea.  Eyes:     General: No scleral icterus. Cardiovascular:     Rate and Rhythm: Normal rate and regular rhythm.     Pulses: Normal pulses.  Pulmonary:     Effort: Pulmonary effort is normal. No respiratory distress.     Breath sounds: Normal breath sounds. No wheezing or rales.  Abdominal:     General: Bowel sounds are normal. There is no distension.     Tenderness: There is no abdominal tenderness. There is no guarding.  Musculoskeletal:     Comments: Left leg shortened.  Mildly externally rotated.  Skin:    General: Skin is warm and dry.     Capillary Refill: Capillary refill takes less than 2 seconds.  Neurological:     General: No focal deficit present.     Mental Status: She is alert and oriented to person, place, and time.     Comments: Hard of hearing     Labs on Admission: I have personally reviewed following labs and imaging studies  CBC: Recent Labs  Lab 03/18/22 0040  WBC 11.1*  NEUTROABS 8.7*  HGB 10.4*  HCT 30.1*  MCV 95.6  PLT 400   Basic Metabolic Panel: Recent Labs  Lab 03/18/22 0040  NA 139  K 4.3  CL 104  CO2 24  GLUCOSE 68*  BUN 58*  CREATININE 2.43*  CALCIUM 9.0   GFR: Estimated Creatinine Clearance: 14 mL/min (A) (by C-G formula based on SCr of 2.43 mg/dL (  H)). Liver Function Tests: No results for input(s): AST, ALT, ALKPHOS, BILITOT, PROT, ALBUMIN in the last 168 hours. No results for input(s): LIPASE, AMYLASE in the last 168 hours. No results for input(s): AMMONIA in the last 168 hours. Coagulation Profile: No results for input(s): INR, PROTIME in the last 168 hours. Cardiac Enzymes: No results for input(s): CKTOTAL, CKMB, CKMBINDEX, TROPONINI, TROPONINIHS in the last 168 hours. BNP (last 3 results) No results for input(s): PROBNP in the last 8760 hours. HbA1C: No results for input(s): HGBA1C in the last 72 hours. CBG: Recent Labs  Lab 03/18/22 0054  GLUCAP 77   Lipid Profile: No results for input(s): CHOL, HDL,  LDLCALC, TRIG, CHOLHDL, LDLDIRECT in the last 72 hours. Thyroid Function Tests: No results for input(s): TSH, T4TOTAL, FREET4, T3FREE, THYROIDAB in the last 72 hours. Anemia Panel: No results for input(s): VITAMINB12, FOLATE, FERRITIN, TIBC, IRON, RETICCTPCT in the last 72 hours. Urine analysis:    Component Value Date/Time   COLORURINE STRAW (A) 03/18/2022 0222   APPEARANCEUR CLEAR 03/18/2022 0222   LABSPEC 1.009 03/18/2022 0222   PHURINE 5.0 03/18/2022 0222   GLUCOSEU NEGATIVE 03/18/2022 0222   HGBUR NEGATIVE 03/18/2022 0222   BILIRUBINUR NEGATIVE 03/18/2022 0222   KETONESUR NEGATIVE 03/18/2022 0222   PROTEINUR 100 (A) 03/18/2022 0222   UROBILINOGEN 0.2 01/04/2012 1105   NITRITE NEGATIVE 03/18/2022 0222   LEUKOCYTESUR NEGATIVE 03/18/2022 0222    Radiological Exams on Admission: I have personally reviewed images CT Head Wo Contrast  Result Date: 03/18/2022 CLINICAL DATA:  Polytrauma, blunt.  Fall, head injury EXAM: CT HEAD WITHOUT CONTRAST CT CERVICAL SPINE WITHOUT CONTRAST TECHNIQUE: Multidetector CT imaging of the head and cervical spine was performed following the standard protocol without intravenous contrast. Multiplanar CT image reconstructions of the cervical spine were also generated. RADIATION DOSE REDUCTION: This exam was performed according to the departmental dose-optimization program which includes automated exposure control, adjustment of the mA and/or kV according to patient size and/or use of iterative reconstruction technique. COMPARISON:  None Available. FINDINGS: CT HEAD FINDINGS Brain: Normal anatomic configuration. Parenchymal volume loss is commensurate with the patient's age. Mild periventricular white matter changes are present likely reflecting the sequela of small vessel ischemia. No abnormal intra or extra-axial mass lesion or fluid collection. No abnormal mass effect or midline shift. No evidence of acute intracranial hemorrhage or infarct. Ventricular size is  normal. Cerebellum unremarkable. Vascular: No asymmetric hyperdense vasculature at the skull base. Skull: Intact Sinuses/Orbits: Paranasal sinuses are clear. Orbits are unremarkable. Other: Mastoid air cells and middle ear cavities are clear. CT CERVICAL SPINE FINDINGS Alignment: Normal. Skull base and vertebrae: Craniocervical alignment is normal. Atlantodental interval is not widened. No acute fracture of the cervical spine. Vertebral body height is preserved. Soft tissues and spinal canal: No canal hematoma. No prevertebral soft tissue swelling or paravertebral fluid collection. Extensive atherosclerotic calcification within the carotid bifurcations. No pathologic cervical adenopathy. Disc levels: Intervertebral disc space narrowing and endplate remodeling at B3-4 in keeping with changes of advanced degenerative disc disease. Mild degenerative changes are seen throughout the remainder of the cervical spine. Prevertebral soft tissues are not thickened on sagittal reformats. The spinal canal is widely patent. No significant neuroforaminal narrowing. Upper chest: Unremarkable Other: None IMPRESSION: No acute intracranial injury.  No calvarial fracture. No acute fracture or listhesis of the cervical spine. Peripheral vascular disease with extensive atherosclerotic calcification within the carotid bifurcations. Electronically Signed   By: Fidela Salisbury M.D.   On: 03/18/2022 01:59  CT Cervical Spine Wo Contrast  Result Date: 03/18/2022 CLINICAL DATA:  Polytrauma, blunt.  Fall, head injury EXAM: CT HEAD WITHOUT CONTRAST CT CERVICAL SPINE WITHOUT CONTRAST TECHNIQUE: Multidetector CT imaging of the head and cervical spine was performed following the standard protocol without intravenous contrast. Multiplanar CT image reconstructions of the cervical spine were also generated. RADIATION DOSE REDUCTION: This exam was performed according to the departmental dose-optimization program which includes automated exposure  control, adjustment of the mA and/or kV according to patient size and/or use of iterative reconstruction technique. COMPARISON:  None Available. FINDINGS: CT HEAD FINDINGS Brain: Normal anatomic configuration. Parenchymal volume loss is commensurate with the patient's age. Mild periventricular white matter changes are present likely reflecting the sequela of small vessel ischemia. No abnormal intra or extra-axial mass lesion or fluid collection. No abnormal mass effect or midline shift. No evidence of acute intracranial hemorrhage or infarct. Ventricular size is normal. Cerebellum unremarkable. Vascular: No asymmetric hyperdense vasculature at the skull base. Skull: Intact Sinuses/Orbits: Paranasal sinuses are clear. Orbits are unremarkable. Other: Mastoid air cells and middle ear cavities are clear. CT CERVICAL SPINE FINDINGS Alignment: Normal. Skull base and vertebrae: Craniocervical alignment is normal. Atlantodental interval is not widened. No acute fracture of the cervical spine. Vertebral body height is preserved. Soft tissues and spinal canal: No canal hematoma. No prevertebral soft tissue swelling or paravertebral fluid collection. Extensive atherosclerotic calcification within the carotid bifurcations. No pathologic cervical adenopathy. Disc levels: Intervertebral disc space narrowing and endplate remodeling at F7-5 in keeping with changes of advanced degenerative disc disease. Mild degenerative changes are seen throughout the remainder of the cervical spine. Prevertebral soft tissues are not thickened on sagittal reformats. The spinal canal is widely patent. No significant neuroforaminal narrowing. Upper chest: Unremarkable Other: None IMPRESSION: No acute intracranial injury.  No calvarial fracture. No acute fracture or listhesis of the cervical spine. Peripheral vascular disease with extensive atherosclerotic calcification within the carotid bifurcations. Electronically Signed   By: Fidela Salisbury M.D.    On: 03/18/2022 01:59   DG Humerus Left  Result Date: 03/18/2022 CLINICAL DATA:  Fall with deformity. EXAM: LEFT HUMERUS - 2+ VIEW COMPARISON:  11/23/2016, 02/10/2022. FINDINGS: There is deformity of the left humeral head/neck which is improved from the prior exam and likely related to old trauma. No acute fracture or dislocation is seen. Degenerative changes are present at the acromioclavicular joint. IMPRESSION: Bony deformity of the left humeral head/neck, unchanged from the previous exam and likely related to old trauma. No acute fracture is identified. Electronically Signed   By: Brett Fairy M.D.   On: 03/18/2022 00:43   DG Hip Unilat With Pelvis 2-3 Views Left  Result Date: 03/18/2022 CLINICAL DATA:  Fall, with deformity. EXAM: LEFT FEMUR 1 VIEW; DG HIP (WITH OR WITHOUT PELVIS) 2-3V LEFT COMPARISON:  05/17/2019. FINDINGS: There is a comminuted intertrochanteric fracture of the left hip with mild lateral angulation and superior subluxation of the distal fracture fragment. No dislocation is seen. The remaining bony structures appear intact. Total knee arthroplasty changes are noted on the left without evidence of hardware loosening. Mild degenerative changes are noted in the lower lumbar spine. Vascular calcifications are noted in the pelvis and lower extremities bilaterally. IMPRESSION: Comminuted angulated intratrochanteric fracture of the proximal left femur. Electronically Signed   By: Brett Fairy M.D.   On: 03/18/2022 00:39   DG Femur 1V Left  Result Date: 03/18/2022 CLINICAL DATA:  Fall, with deformity. EXAM: LEFT FEMUR 1 VIEW; DG HIP (WITH  OR WITHOUT PELVIS) 2-3V LEFT COMPARISON:  05/17/2019. FINDINGS: There is a comminuted intertrochanteric fracture of the left hip with mild lateral angulation and superior subluxation of the distal fracture fragment. No dislocation is seen. The remaining bony structures appear intact. Total knee arthroplasty changes are noted on the left without evidence  of hardware loosening. Mild degenerative changes are noted in the lower lumbar spine. Vascular calcifications are noted in the pelvis and lower extremities bilaterally. IMPRESSION: Comminuted angulated intratrochanteric fracture of the proximal left femur. Electronically Signed   By: Brett Fairy M.D.   On: 03/18/2022 00:39    EKG: My personal interpretation of EKG shows: NSR    Assessment/Plan Principal Problem:   Closed intertrochanteric fracture of left hip (HCC) Active Problems:   GERD   Insulin dependent type 2 diabetes mellitus ([place])   Hyperlipidemia associated with type 2 diabetes mellitus (HCC)   S/P CABG (coronary artery bypass graft), 12/04/11   Hypertension associated with diabetes ([place])   Diabetic peripheral neuropathy associated with type 1 diabetes mellitus (HCC)   Hypothyroidism   CKD (chronic kidney disease) stage 4, GFR 15-29 ml/min (HCC)   Chronic diastolic CHF (congestive heart failure) (HCC)   S/P placement of cardiac pacemaker   SSS (sick sinus syndrome) (HCC)   Paroxysmal atrial fibrillation (HCC) - not on systemic anticoagulation due to hx of GI bleeding   Anemia in chronic kidney disease   Cardiac pacemaker in situ   DNR (do not resuscitate)/DNI(Do Not Intubate)  Assessment and Plan: * Closed intertrochanteric fracture of left hip ([place]) Admit to inpatient med surg bed. Closed intertrochanteric fracture of left hip (HCC) is a Acute illness/condition that poses a threat to life or bodily function. IV dilaudid 0.5 mg for severe pain, norco 5/325 for moderate pain. Keep NPO until seen by orthopedics. LR 50 ml/hr while NPO.  DNR (do not resuscitate)/DNI(Do Not Intubate) Verified with pt and her dtr [name] that pt is a DNR/DNI. She will need yellow DNR form completed prior to discharge.  Cardiac pacemaker in situ Stable.  Anemia in chronic kidney disease Stable.   Paroxysmal atrial fibrillation (HCC) - not on systemic anticoagulation due to hx of GI  bleeding Stable. In NSR on EKG. Not on systemic AC due to hx of GI bleeding  SSS (sick sinus syndrome) (HCC) Stable. S/p PPM.  S/P placement of cardiac pacemaker Stable.  Chronic diastolic CHF (congestive heart failure) (HCC) Stable. Euvolemic. Continue demadex 40 mg daily and GDMT.  CKD (chronic kidney disease) stage 4, GFR 15-29 ml/min (HCC) Stable.  Hypothyroidism Stable. Continue synthroid 100 mcg qhs.  Diabetic peripheral neuropathy associated with type 1 diabetes mellitus (HCC) Stable. Continue neurontin 300 mg qhs.  Hypertension associated with diabetes ([place]) Stable. Continue coreg 25 mg bid, imdur 30 mg qd, demadex 40 mg qd, norvasc 5 mg qd.  S/P CABG (coronary artery bypass graft), 12/04/11 Stable. Continue asa 81 mg.  Hyperlipidemia associated with type 2 diabetes mellitus (HCC) Stable. Continue crestor 10 mg qhs. Check lipid panel  Insulin dependent type 2 diabetes mellitus (HCC) Stable. Continue lantus 20 units. Add SSI. Check A1c.  GERD Stable. Continue protonix 40 mg.   DVT prophylaxis: SCDs Code Status: DNR/DNI(Do NOT Intubate) verified with pt and dtr [name] Family Communication: discussed with pt and dtr melissa at bedside  Disposition Plan: return home with HHPT vs SNF  Consults called: EDP has consulted orthopedics. Dr. Mable Fill  Admission status: Inpatient, Med-Surg   Kristopher Oppenheim, DO [facility] 03/18/2022, 5:04 AM

## 2022-03-18 NOTE — Op Note (Signed)
OPERATIVE NOTE  [name] female 81 y.o. 03/18/2022  PREOPERATIVE DIAGNOSIS: Left displaced intertrochanteric fracture Osteoporosis with current insufficiency fracture of left proximal femur   POSTOPERATIVE DIAGNOSIS: Left displaced intertrochanteric fracture (I09.735) Osteoporosis with current insufficiency fracture of left proximal femur (M80.051)   PROCEDURE(S): Left displaced intertrochanteric fracture reduction and cephalomedullary nail fixation (32992) Operative use of fluoroscopy for above procedure(s) (42683)   SURGEON: Georgeanna Harrison, M.D.  ASSISTANT(S): None  ANESTHESIA: Choice  FINDINGS: Preoperative Examination: Left Lower Extremity: Inspection: Hip held in flexion and external rotation Palpation: Tender to palpation over site of known intertrochanteric fracture Strength: Normal dorsiflexion, plantarflexion, and EHL strength and function Peripheral Vascular: Normal distal pulses, warm and well-perfused Sensation: Intact to light touch to baseline, with diminished sensation throughout, tibial distribution most affected, to a lesser extent superficial and deep peroneal distributions  Operative Findings: Displaced left intertrochanteric fracture redemonstrated on preoperative fluoroscopic evaluation.  Closed reduction performed on Hana table with traction and ligamentotaxis.  Reduction confirmed on orthogonal AP and lateral fluoroscopic views with restoration of alignment and neck-shaft angle.  Appropriate placement of cephalomedullary nail and interlock screws confirmed on orthogonal AP and lateral fluoroscopic views, with maintenance of fracture reduction.  IMPLANTS: Implant Name Type Inv. Item Serial No. Manufacturer Lot No. LRB No. Used Action  IMPLANT DEG TI CANN 11MM/130 - [id number] Orthopedic Implant IMPLANT DEG TI CANN 11MM/130  [employer] [id number] Left 1 Implanted  SCREW TFNA HELICAL 417 - [id number] Screw SCREW TFNA HELICAL 563  [employer]   Left 1 Implanted  SCREW LOCK IM TI 5X30 - [id number] Screw SCREW LOCK IM TI 5X30  [employer]  Left 1 Implanted    INDICATIONS:  The patient is a 81 y.o. female who sustained a mechanical fall resulting in inability to bear weight on the left lower extremity.  She presented to the emergency room.  She was found to have sustained a left intertrochanteric fracture.  Before the injury, she was ambulatory with a walker and lived fairly independently.  Given the morbidity associated with nonoperative treatment events functional status prior to the injury she was recommended to undergo surgical treatment with cephalomedullary nail fixation.  She understood the risks, benefits and alternatives to surgery which include but are not limited to bleeding, wound healing complications, infection, damage to surrounding structures, persistent pain, stiffness, lack of improvement, potential for subsequent arthritis or worsening of pre-existing arthritis, nonunion, malunion, and need for further surgery, as well as complications related to anesthesia, cardiovascular complications, and death.  She also understood the potential for continued pain, and that there were no guarantees of acceptable outcome.  After weighing these risks the patient opted to proceed with surgery.  TECHNIQUE: Patient was identified in the preoperative holding area.  The left hip was marked by myself.  Consent was signed by myself and the patient.   FI block was performed by anesthesia in the preoperative holding area.  Patient was taken to the operative suite and placed supine on the operative table.  Anesthesia was induced by the anesthesia team.  The patient was positioned appropriately for the procedure and all bony prominences were well padded.  A tourniquet was not used.  Preoperative antibiotics were given. The extremity was prepped and draped in the usual sterile fashion and surgical timeout was performed.  A closed reduction was performed  with traction using ligamentotaxis on the Hana table, and reduction was confirmed on orthogonal AP and lateral fluoroscopic views.  Bony surface anatomy was marked out on  the skin, and a curvilinear incision proximal to the tip the greater trochanter was marked out laterally.  Skin was incised sharply.  Underlying subcutaneous fatty tissues were dissected onto the fascial layer with Bovie electrocautery.  Fascia was divided sharply in line with fibers, and underlying muscle was bluntly divided with a Cobb, exposing the trochanteric starting point.  Trajectory point was identified fluoroscopically.  The starting wire was positioned at the trochanteric starting point and advanced in a center center position in the intramedullary canal of the proximal femur.  Appropriate wire position was confirmed on orthogonal AP and lateral fluoroscopic views.  The proximal femur was cannulated the trochanteric starting point with the entry reamer, and the starting wire and entry reamer were withdrawn.  An 11 mm x 130 mm nail was mounted to the jig and impacted into appropriate position in the intramedullary canal of the proximal femur.  Appropriate intramedullary position was confirmed on orthogonal AP and lateral fluoroscopic views.  Entry point for the head screw was noted laterally on the skin, and the skin was incised sharply as well as the underlying subcutaneous fat and fascia down onto the lateral femoral cortex.  The triple sleeve for the head screw was advanced in position of the lateral femoral cortex.  Under fluoroscopic guidance a guidewire for the head screw was advanced into position proximally, avoiding subchondral penetration.  Appropriate wire position was confirmed on orthogonal AP and lateral fluoroscopic views.  Measurement was obtained off the wire, and cannulated drill was used to create a path for the screw.  Derotation wire was placed through the jig.  Head screw was advanced into position over the wire, and  the derotation wire was withdrawn.  Under fluoroscopic visualization compression was carefully applied across the fracture, and then the construct was statically locked.  The guidewire for the screw was withdrawn.  A distal interlock screw was placed through the jig through the same lateral incision.  Wounds were copiously irrigated and hemostasis was obtained.  1 g vancomycin powder was placed at all layers and the wounds.  Fascial layers were closed with interrupted figure-of-eight #1 PDS, followed by simple inverted interrupted 0 Vicryl in the deep fat, followed by simple inverted erupted 2-0 Monocryl deep dermal, followed by running 3 Monocryl subcuticular.  Skin was sealed with Dermabond and suture tails were secured with Steri-Strips.  Aquacel dressing was placed over the wound.  Patient was awakened from anesthesia and transferred to PACU in stable condition.  She tolerated the procedure well.  There were no complications.  POST OPERATIVE INSTRUCTIONS: Mobility: Out of bed with PT/OT Pain control: Continue to wean/titrate to appropriate oral regimen DVT Prophylaxis: Lovenox x6 weeks postoperatively Further surgical plans: None RUE: Weightbearing as tolerated, no restrictions LUE: Weightbearing as tolerated, no restrictions RLE: Weightbearing as tolerated, no restrictions LLE: Weightbearing as tolerated, no restrictions Disposition: Per primary team as medically appropriate Dressing care: Keep AQUACEL on and dry for up to 14 days.  Do not allow surgical area to get wet before that.  Remove AQUACEL dressing after 14 days and allow area to get wet in shower but DO NOT SUBMERGE until wound is evaluated in clinic.  In most cases skin glue is used and no additional dressing is necessary.  Follow-up: Please call [facility] [phone]) to schedule follow-op appointment for 2 weeks after surgery.  TOURNIQUET TIME: * No tourniquets in log *  BLOOD LOSS: 100 mL  DRAINS: none         SPECIMEN: none       COMPLICATIONS:  * No complications entered in OR log *         DISPOSITION: PACU - hemodynamically stable.         CONDITION: stable   Georgeanna Harrison M.D. Orthopaedic Surgery [facility]   Portions of the record have been created with voice recognition software.  Grammatical and punctuation errors, random word insertions, wrong-word or "sound-a-like" substitutions, pronoun errors (inaccuracies and/or substitutions), and/or incomplete sentences may have occurred due to the inherent limitations of voice recognition software.  Not all errors are caught or corrected.  Although every attempt is made to root out erroneous and incomplete transcription, the note may still not fully represent the intent or opinion of the author.  Read the chart carefully and recognize, using context, where errors/substitutions have occurred.  Any questions or concerns about the content of this note or information contained within the body of this dictation should be addressed directly with the author for clarification.

## 2022-03-18 NOTE — Assessment & Plan Note (Addendum)
Stable. In NSR on EKG. Not on systemic AC due to hx of GI bleeding

## 2022-03-18 NOTE — Assessment & Plan Note (Signed)
Stable. Continue protonix 40 mg.

## 2022-03-18 NOTE — Assessment & Plan Note (Signed)
Stable. Euvolemic. Continue demadex 40 mg daily and GDMT.

## 2022-03-18 NOTE — Assessment & Plan Note (Signed)
Stable. Continue synthroid 100 mcg qhs.

## 2022-03-18 NOTE — TOC CAGE-AID Note (Signed)
Transition of Care [facility]) - CAGE-AID Screening   Patient Details  Name: [name] MRN: [id number] Date of Birth: 08-06-1941  [facility] CM/SW Contact:    Damier Disano C Tarpley-Carter, [place] Phone Number: 03/18/2022, 9:48 AM   Clinical Narrative: Pt participated in [place].  Pt stated she does not use substance or ETOH.  Pt was not offered resources, due to no usage of substance or ETOH.     Kalisa Girtman Tarpley-Carter, MSW, LCSW-A Pronouns:  She/Her/Hers [facility] Clinical Social Worker Direct Number:  [phone] [email or web address]  CAGE-AID Screening:    Have You Ever Felt You Ought to Cut Down on Your Drinking or Drug Use?: No Have People Annoyed You By [employer] Your Drinking Or Drug Use?: No Have You Felt Bad Or Guilty About Your Drinking Or Drug Use?: No Have You Ever Had a Drink or Used Drugs First Thing In The Morning to Steady Your Nerves or to Get Rid of a Hangover?: No CAGE-AID Score: 0  Substance Abuse Education Offered: No

## 2022-03-18 NOTE — Anesthesia Procedure Notes (Signed)
Spinal  Patient location during procedure: OR End time: 03/18/2022 4:22 PM Reason for block: surgical anesthesia Staffing Performed: anesthesiologist  Anesthesiologist: Annye Asa, MD Preanesthetic Checklist Completed: patient identified, IV checked, site marked, risks and benefits discussed, surgical consent, monitors and equipment checked, pre-op evaluation and timeout performed Spinal Block Patient position: sitting Prep: DuraPrep and site prepped and draped Patient monitoring: blood pressure, continuous pulse ox, cardiac monitor and heart rate Approach: midline Location: L3-4 Injection technique: single-shot Needle Needle type: Quincke  Needle gauge: 22 G Needle length: 9 cm Assessment Events: CSF return Additional Notes Pt identified in Operating room.  Monitors applied. Working IV access confirmed. Sterile prep, drape lumbar spine.  1% lido local L 3,4.  #24ga Pencan several attempts os, #22ga Quincke into clear CSF L 3,4.  '12mg'$  0.75% Bupivacaine with dextrose injected with asp CSF beginning and end of injection.  Patient asymptomatic, VSS, no heme aspirated, tolerated well.  Jenita Seashore, MD

## 2022-03-18 NOTE — ED Notes (Signed)
Patient transported to CT 

## 2022-03-18 NOTE — Transfer of Care (Signed)
Immediate Anesthesia Transfer of Care Note  Patient: [name]  Procedure(s) Performed: INTRAMEDULLARY (IM) NAIL INTERTROCHANTRIC (Left)  Patient Location: PACU  Anesthesia Type:MAC combined with regional for post-op pain  Level of Consciousness: awake, alert  and oriented  Airway & Oxygen Therapy: Patient Spontanous Breathing  Post-op Assessment: Report given to RN and Post -op Vital signs reviewed and stable  Post vital signs: Reviewed and stable  Last Vitals:  Vitals Value Taken Time  BP 98/52 03/18/22 1823  Temp    Pulse 62 03/18/22 1826  Resp 11 03/18/22 1826  SpO2 99 % 03/18/22 1826  Vitals shown include unvalidated device data.  Last Pain:  Vitals:   03/18/22 1443  TempSrc: Oral  PainSc: 5       Patients Stated Pain Goal: 3 (09/32/67 1245)  Complications: No notable events documented.

## 2022-03-18 NOTE — Consult Note (Signed)
Reason for Consult:Left hip fx Referring Physician: Eric [place] Time called: 9509 Time at bedside: Eagar is an 81 y.o. female.  HPI: [name] was in her bedroom, bent down to pick something up, and lost her balance when she stood up. She fell, had immediate left hip pain, and could not get up. She was brought to the ED where x-rays showed a left hip fx and orthopedic surgery was consulted. She lives at home with a grandson and ambulates with either a RW or cane.  Past Medical History:  Diagnosis Date   Acute medial meniscus tear of right knee 07/02/2014   Acute renal failure,admitted 01/03/12, after admission for diastolic chf 01/18/7123   Anemia    Anxiety    Arthritis    "in my hands; knees, back" (09/27/2018)   CAD (coronary artery disease)    a. 40-59% bilaterally 10/2015.   Chronic diastolic CHF (congestive heart failure) ([place])    a. 05/2016 Echo: EF 60-65%, no rwma, Gr1 DD, Ao sclerosis w/o stenosis, triv MR;  b. 07/2016 TEE: EF 55-60%, no rwma, mild MR.   Chronic headaches    CKD (chronic kidney disease), stage III (HCC)    Coronary artery disease    a. 02/2007 Persantine MV: low risk;  b. 11/2011 CABG x 3 (LIMA->LAD, VG->OM, VG->RCA);  c. 05/2016 MV: EF >65%, no isch/infarct, horiz ST dep in I, II, V5-V6.   Depression    Diverticulosis    Esophageal stricture    GERD (gastroesophageal reflux disease)    Hemorrhoids    Hiatal hernia    Hyperkalemia    a. ARB stopped due to this.   Hyperlipidemia    Hypertension    Hypertensive heart disease    Hypothyroidism    Major depressive disorder with anxious distress 09/05/2019   Mild cognitive impairment    a. seen by neurology.   Mild vascular neurocognitive disorder 09/05/2019   Myocardial infarction ([place]) 12/07/2011   NSTEMI (non-ST elevated myocardial infarction) ([place]) 12/01/2011   PAF (paroxysmal atrial fibrillation) ([place])    a. post-op CABG.   Pain    RIGHT KNEE PAIN - TORN RIGHT MEDIAL MENISCUS   Paroxysmal  atrial flutter ([place])    a. 07/2016 s/p TEE & DCCV;  b. 07/2016 Recurrent PAFlutter req initiation of amio & PPM in setting of tachy-brady;  c. CHA2DS2VASc = 7-->Xarelto 15 mg QD.   Pneumonia    "twice" (09/27/2018)   PONV (postoperative nausea and vomiting)    Presence of permanent cardiac pacemaker 08/12/2016   S/P CABG (coronary artery bypass graft), 12/04/11 12/07/2011   LIMA to LAD, SVG to OM, SVG to RCA   Sinus bradycardia    a. not on BB due to this.   Skin cancer    "face" (09/27/2018)   Tachy-brady syndrome ([place])    a. 07/2016 Jxnl brady following DCCV, recurrent Aflutter-->amio + SJM 2272 [employer]urity MRI DC PPM (ser # [id number]).   Type II diabetes mellitus ([place])     Past Surgical History:  Procedure Laterality Date   ABDOMINAL HYSTERECTOMY  1980's   ANKLE FRACTURE SURGERY Right    "put pins both side right ankle"   BACK SURGERY  2006   "cyst growing near my spine"   CARDIAC CATHETERIZATION  12/02/2011   mild LV dysfunction with mod hypocontractility of mid-distal anterolateral wall; CAD w/ostial tapering of L [place] with 50% diffuse ostial narrowing of LAD, 99% eccentric focal prox LAD stenosis followed by 70% prox LAD  stenosis after 1st diag, 20% mid LAD narrowing; 80% ostial-to-prox L Cfx stenosis & 40-50% irregularity of RCA (Dr. Corky Downs)   CARDIOVERSION N/A 08/11/2016   Procedure: CARDIOVERSION;  Surgeon: Lelon Perla, MD;  Location: [facility] ENDOSCOPY;  Service: Cardiovascular;  Laterality: N/A;   CATARACT EXTRACTION W/ INTRAOCULAR LENS  IMPLANT, BILATERAL Bilateral ~ 2010   [place] GRAFT  12/04/2011   Procedure: CORONARY ARTERY BYPASS GRAFTING (CABG);  Surgeon: Tharon Aquas Adelene Idler, MD;  Location: [facility];  Service: Open Heart Surgery;  Laterality: N/A;  CABG x three,  using left internal mammary artery, and right leg greater saphenous vein harvested endoscopically   CORONARY STENT INTERVENTION N/A 09/27/2018   Procedure: CORONARY STENT  INTERVENTION;  Surgeon: Troy Sine, MD;  Location: [facility] CV LAB;  Service: Cardiovascular;  Laterality: N/A;   DILATION AND CURETTAGE OF UTERUS     "a couple times"   EP IMPLANTABLE DEVICE N/A 08/12/2016   Procedure: Pacemaker Implant;  Surgeon: Will Meredith Leeds, MD;  Location: [facility] CV LAB;  Service: Cardiovascular;  Laterality: N/A;   ESOPHAGOGASTRODUODENOSCOPY (EGD) WITH ESOPHAGEAL DILATION     FRACTURE SURGERY     JOINT REPLACEMENT     KNEE ARTHROSCOPY WITH MEDIAL MENISECTOMY Right 07/02/2014   Procedure: RIGHT KNEE ARTHROSCOPY WITH PARTIAL MEDIAL MENISTECTOMY, ABRASION CONDROPLASTYU OF PATELLA,ABRASION CONDROPLASTY OF MEDIAL FEMEROL CONDYL, MICROFRACTURE OF MEDIAL FEMEROL CONDYL;  Surgeon: Tobi Bastos, MD;  Location: [facility] ORS;  Service: Orthopedics;  Laterality: Right;   LEFT HEART CATH AND CORS/GRAFTS ANGIOGRAPHY N/A 09/06/2018   Procedure: LEFT HEART CATH AND CORS/GRAFTS ANGIOGRAPHY;  Surgeon: Troy Sine, MD;  Location: [facility] CV LAB;  Service: Cardiovascular;  Laterality: N/A;   LEFT HEART CATHETERIZATION WITH CORONARY ANGIOGRAM N/A 12/02/2011   Procedure: LEFT HEART CATHETERIZATION WITH CORONARY ANGIOGRAM;  Surgeon: Troy Sine, MD;  Location: [facility] CATH LAB;  Service: Cardiovascular;  Laterality: N/A;  Coronary angiogram, possible PCI   TEE WITHOUT CARDIOVERSION N/A 08/11/2016   Procedure: TRANSESOPHAGEAL ECHOCARDIOGRAM (TEE);  Surgeon: Lelon Perla, MD;  Location: [facility] ENDOSCOPY;  Service: Cardiovascular;  Laterality: N/A;   TEE WITHOUT CARDIOVERSION N/A 05/18/2019   Procedure: TRANSESOPHAGEAL ECHOCARDIOGRAM (TEE);  Surgeon: [name], MD;  Location: [facility] ENDOSCOPY;  Service: Cardiovascular;  Laterality: N/A;   TONSILLECTOMY  1949   TOTAL KNEE ARTHROPLASTY Left ~ 2006   TRANSTHORACIC ECHOCARDIOGRAM  02/19/2013   EF 45-80%, grade 1 diastolic dysfunction; mildly thickend/calcified AV leaflets; mildly calcidied MV annulus; mild TR    Family History   Problem Relation Age of Onset   Diabetes Mother    CVA Mother    Hypertension Mother    Heart disease Father    Hyperlipidemia Father    Breast cancer Sister    Breast cancer Sister        x 3   Heart disease Brother        x5; one with MI   Heart disease Sister        x3   Diabetes Sister        x3   Lung cancer Sister    Breast cancer Sister    Colon cancer Neg Hx     Social History:  reports that she has never smoked. She has never used smokeless tobacco. She reports that she does not currently use alcohol. She reports that she does not use drugs.  Allergies:  Allergies  Allergen Reactions   Clonidine  Derivatives Other (See Comments)    Bradycardia and fatigue    Clonidine Hcl Other (See Comments)    Bradycardia   Crestor [Rosuvastatin] Other (See Comments)    Made the patient feel tired/weak   Losartan Potassium Other (See Comments)    Hyperkalemia   Sulfa Antibiotics Other (See Comments)    Childhood reaction not recalled   Epinephrine Other (See Comments)    Abnormal feeling. Dental exam/injection of local w/ epi.   Hydralazine Hcl Anxiety and Other (See Comments)    Nervousness, anxiousness, GI upset    Medications: I have reviewed the patient's current medications.  Results for orders placed or performed during the hospital encounter of 03/17/22 (from the past 48 hour(s))  Basic metabolic panel     Status: Abnormal   Collection Time: 03/18/22 12:40 AM  Result Value Ref Range   Sodium 139 135 - 145 mmol/L   Potassium 4.3 3.5 - 5.1 mmol/L   Chloride 104 98 - 111 mmol/L   CO2 24 22 - 32 mmol/L   Glucose, Bld 68 (L) 70 - 99 mg/dL    Comment: Glucose reference range applies only to samples taken after fasting for at least 8 hours.   BUN 58 (H) 8 - 23 mg/dL   Creatinine, Ser 2.43 (H) 0.44 - 1.00 mg/dL   Calcium 9.0 8.9 - 10.3 mg/dL   GFR, Estimated 19 (L) >60 mL/min    Comment: (NOTE) Calculated using the CKD-EPI Creatinine Equation (2021)    Anion gap  11 5 - 15    Comment: Performed at [facility] [address], [place], [place] [address]  CBC with Differential     Status: Abnormal   Collection Time: 03/18/22 12:40 AM  Result Value Ref Range   WBC 11.1 (H) 4.0 - 10.5 K/uL   RBC 3.15 (L) 3.87 - 5.11 MIL/uL   Hemoglobin 10.4 (L) 12.0 - 15.0 g/dL   HCT 30.1 (L) 36.0 - 46.0 %   MCV 95.6 80.0 - 100.0 fL   MCH 33.0 26.0 - 34.0 pg   MCHC 34.6 30.0 - 36.0 g/dL   RDW 13.8 11.5 - 15.5 %   Platelets 155 150 - 400 K/uL   nRBC 0.0 0.0 - 0.2 %   Neutrophils Relative % 79 %   Neutro Abs 8.7 (H) 1.7 - 7.7 K/uL   Lymphocytes Relative 13 %   Lymphs Abs 1.5 0.7 - 4.0 K/uL   Monocytes Relative 5 %   Monocytes Absolute 0.6 0.1 - 1.0 K/uL   Eosinophils Relative 2 %   Eosinophils Absolute 0.2 0.0 - 0.5 K/uL   Basophils Relative 0 %   Basophils Absolute 0.0 0.0 - 0.1 K/uL   Immature Granulocytes 1 %   Abs Immature Granulocytes 0.05 0.00 - 0.07 K/uL    Comment: Performed at [facility], [place] [address], [place],  [address]  Type and screen [place]     Status: None   Collection Time: 03/18/22 12:40 AM  Result Value Ref Range   ABO/RH(D) A NEG    Antibody Screen NEG    Sample Expiration      03/21/2022,2359 Performed at [facility], [place] [address] [place],  [address]   CBG monitoring, ED     Status: None   Collection Time: 03/18/22 12:54 AM  Result Value Ref Range   Glucose-Capillary 77 70 - 99 mg/dL    Comment: Glucose reference range applies only to samples  taken after fasting for at least 8 hours.  Urinalysis, Routine w reflex microscopic Urine, Catheterized     Status: Abnormal   Collection Time: 03/18/22  2:22 AM  Result Value Ref Range   Color, Urine STRAW (A) YELLOW   APPearance CLEAR CLEAR   Specific Gravity, Urine 1.009 1.005 - 1.030   pH 5.0 5.0 - 8.0   Glucose, UA NEGATIVE NEGATIVE mg/dL   Hgb urine dipstick NEGATIVE NEGATIVE   Bilirubin Urine NEGATIVE NEGATIVE    Ketones, ur NEGATIVE NEGATIVE mg/dL   Protein, ur 100 (A) NEGATIVE mg/dL   Nitrite NEGATIVE NEGATIVE   Leukocytes,Ua NEGATIVE NEGATIVE   RBC / HPF 0-5 0 - 5 RBC/hpf   Bacteria, UA NONE SEEN NONE SEEN    Comment: Performed at [facility] [address], [place], [place] [address]    CT Head Wo Contrast  Result Date: 03/18/2022 CLINICAL DATA:  Polytrauma, blunt.  Fall, head injury EXAM: CT HEAD WITHOUT CONTRAST CT CERVICAL SPINE WITHOUT CONTRAST TECHNIQUE: Multidetector CT imaging of the head and cervical spine was performed following the standard protocol without intravenous contrast. Multiplanar CT image reconstructions of the cervical spine were also generated. RADIATION DOSE REDUCTION: This exam was performed according to the departmental dose-optimization program which includes automated exposure control, adjustment of the mA and/or kV according to patient size and/or use of iterative reconstruction technique. COMPARISON:  None Available. FINDINGS: CT HEAD FINDINGS Brain: Normal anatomic configuration. Parenchymal volume loss is commensurate with the patient's age. Mild periventricular white matter changes are present likely reflecting the sequela of small vessel ischemia. No abnormal intra or extra-axial mass lesion or fluid collection. No abnormal mass effect or midline shift. No evidence of acute intracranial hemorrhage or infarct. Ventricular size is normal. Cerebellum unremarkable. Vascular: No asymmetric hyperdense vasculature at the skull base. Skull: Intact Sinuses/Orbits: Paranasal sinuses are clear. Orbits are unremarkable. Other: Mastoid air cells and middle ear cavities are clear. CT CERVICAL SPINE FINDINGS Alignment: Normal. Skull base and vertebrae: Craniocervical alignment is normal. Atlantodental interval is not widened. No acute fracture of the cervical spine. Vertebral body height is preserved. Soft tissues and spinal canal: No canal hematoma. No prevertebral soft tissue  swelling or paravertebral fluid collection. Extensive atherosclerotic calcification within the carotid bifurcations. No pathologic cervical adenopathy. Disc levels: Intervertebral disc space narrowing and endplate remodeling at P3-8 in keeping with changes of advanced degenerative disc disease. Mild degenerative changes are seen throughout the remainder of the cervical spine. Prevertebral soft tissues are not thickened on sagittal reformats. The spinal canal is widely patent. No significant neuroforaminal narrowing. Upper chest: Unremarkable Other: None IMPRESSION: No acute intracranial injury.  No calvarial fracture. No acute fracture or listhesis of the cervical spine. Peripheral vascular disease with extensive atherosclerotic calcification within the carotid bifurcations. Electronically Signed   By: Fidela Salisbury M.D.   On: 03/18/2022 01:59   CT Cervical Spine Wo Contrast  Result Date: 03/18/2022 CLINICAL DATA:  Polytrauma, blunt.  Fall, head injury EXAM: CT HEAD WITHOUT CONTRAST CT CERVICAL SPINE WITHOUT CONTRAST TECHNIQUE: Multidetector CT imaging of the head and cervical spine was performed following the standard protocol without intravenous contrast. Multiplanar CT image reconstructions of the cervical spine were also generated. RADIATION DOSE REDUCTION: This exam was performed according to the departmental dose-optimization program which includes automated exposure control, adjustment of the mA and/or kV according to patient size and/or use of iterative reconstruction technique. COMPARISON:  None Available. FINDINGS: CT HEAD FINDINGS Brain: Normal anatomic configuration. Parenchymal  volume loss is commensurate with the patient's age. Mild periventricular white matter changes are present likely reflecting the sequela of small vessel ischemia. No abnormal intra or extra-axial mass lesion or fluid collection. No abnormal mass effect or midline shift. No evidence of acute intracranial hemorrhage or infarct.  Ventricular size is normal. Cerebellum unremarkable. Vascular: No asymmetric hyperdense vasculature at the skull base. Skull: Intact Sinuses/Orbits: Paranasal sinuses are clear. Orbits are unremarkable. Other: Mastoid air cells and middle ear cavities are clear. CT CERVICAL SPINE FINDINGS Alignment: Normal. Skull base and vertebrae: Craniocervical alignment is normal. Atlantodental interval is not widened. No acute fracture of the cervical spine. Vertebral body height is preserved. Soft tissues and spinal canal: No canal hematoma. No prevertebral soft tissue swelling or paravertebral fluid collection. Extensive atherosclerotic calcification within the carotid bifurcations. No pathologic cervical adenopathy. Disc levels: Intervertebral disc space narrowing and endplate remodeling at Q2-2 in keeping with changes of advanced degenerative disc disease. Mild degenerative changes are seen throughout the remainder of the cervical spine. Prevertebral soft tissues are not thickened on sagittal reformats. The spinal canal is widely patent. No significant neuroforaminal narrowing. Upper chest: Unremarkable Other: None IMPRESSION: No acute intracranial injury.  No calvarial fracture. No acute fracture or listhesis of the cervical spine. Peripheral vascular disease with extensive atherosclerotic calcification within the carotid bifurcations. Electronically Signed   By: Fidela Salisbury M.D.   On: 03/18/2022 01:59   DG Humerus Left  Result Date: 03/18/2022 CLINICAL DATA:  Fall with deformity. EXAM: LEFT HUMERUS - 2+ VIEW COMPARISON:  11/23/2016, 02/10/2022. FINDINGS: There is deformity of the left humeral head/neck which is improved from the prior exam and likely related to old trauma. No acute fracture or dislocation is seen. Degenerative changes are present at the acromioclavicular joint. IMPRESSION: Bony deformity of the left humeral head/neck, unchanged from the previous exam and likely related to old trauma. No acute  fracture is identified. Electronically Signed   By: Brett Fairy M.D.   On: 03/18/2022 00:43   DG Hip Unilat With Pelvis 2-3 Views Left  Result Date: 03/18/2022 CLINICAL DATA:  Fall, with deformity. EXAM: LEFT FEMUR 1 VIEW; DG HIP (WITH OR WITHOUT PELVIS) 2-3V LEFT COMPARISON:  05/17/2019. FINDINGS: There is a comminuted intertrochanteric fracture of the left hip with mild lateral angulation and superior subluxation of the distal fracture fragment. No dislocation is seen. The remaining bony structures appear intact. Total knee arthroplasty changes are noted on the left without evidence of hardware loosening. Mild degenerative changes are noted in the lower lumbar spine. Vascular calcifications are noted in the pelvis and lower extremities bilaterally. IMPRESSION: Comminuted angulated intratrochanteric fracture of the proximal left femur. Electronically Signed   By: Brett Fairy M.D.   On: 03/18/2022 00:39   DG Femur 1V Left  Result Date: 03/18/2022 CLINICAL DATA:  Fall, with deformity. EXAM: LEFT FEMUR 1 VIEW; DG HIP (WITH OR WITHOUT PELVIS) 2-3V LEFT COMPARISON:  05/17/2019. FINDINGS: There is a comminuted intertrochanteric fracture of the left hip with mild lateral angulation and superior subluxation of the distal fracture fragment. No dislocation is seen. The remaining bony structures appear intact. Total knee arthroplasty changes are noted on the left without evidence of hardware loosening. Mild degenerative changes are noted in the lower lumbar spine. Vascular calcifications are noted in the pelvis and lower extremities bilaterally. IMPRESSION: Comminuted angulated intratrochanteric fracture of the proximal left femur. Electronically Signed   By: Brett Fairy M.D.   On: 03/18/2022 00:39    Review of  Systems  HENT:  Negative for ear discharge, ear pain, hearing loss and tinnitus.   Eyes:  Negative for photophobia and pain.  Respiratory:  Negative for cough and shortness of breath.    Cardiovascular:  Negative for chest pain.  Gastrointestinal:  Negative for abdominal pain, nausea and vomiting.  Genitourinary:  Negative for dysuria, flank pain, frequency and urgency.  Musculoskeletal:  Positive for arthralgias (Left hip). Negative for back pain, myalgias and neck pain.  Neurological:  Negative for dizziness and headaches.  Hematological:  Does not bruise/bleed easily.  Psychiatric/Behavioral:  The patient is not nervous/anxious.   Blood pressure (!) 190/53, pulse 64, temperature 97.9 F (36.6 C), temperature source Oral, resp. rate 19, height 5' (1.524 m), weight 56 kg, SpO2 100 %. Physical Exam Constitutional:      General: She is not in acute distress.    Appearance: She is well-developed. She is not diaphoretic.  HENT:     Head: Normocephalic and atraumatic.  Eyes:     General: No scleral icterus.       Right eye: No discharge.        Left eye: No discharge.     Conjunctiva/sclera: Conjunctivae normal.  Cardiovascular:     Rate and Rhythm: Normal rate and regular rhythm.  Pulmonary:     Effort: Pulmonary effort is normal. No respiratory distress.  Musculoskeletal:     Cervical back: Normal range of motion.     Comments: LLE No traumatic wounds, ecchymosis, or rash  Nontender (s/p block)  No knee or ankle effusion  Knee stable to varus/ valgus and anterior/posterior stress  Sens DPN, SPN, TN intact  Motor EHL, ext, flex, evers 5/5  DP 2+, PT 1+, No significant edema  Skin:    General: Skin is warm and dry.  Neurological:     Mental Status: She is alert.  Psychiatric:        Mood and Affect: Mood normal.        Behavior: Behavior normal.    Assessment/Plan: Left hip fx -- Plan IMN today by Dr. Mable Fill. Please keep NPO.    Lisette Abu, PA-C Orthopedic Surgery [phone] 03/18/2022, 10:50 AM

## 2022-03-18 NOTE — Assessment & Plan Note (Signed)
Stable. Continue coreg 25 mg bid, imdur 30 mg qd, demadex 40 mg qd, norvasc 5 mg qd.

## 2022-03-18 NOTE — Progress Notes (Signed)
Inpatient Diabetes Program Recommendations  AACE/ADA: New Consensus Statement on Inpatient Glycemic Control (2015)  Target Ranges:  Prepandial:   less than 140 mg/dL      Peak postprandial:   less than 180 mg/dL (1-2 hours)      Critically ill patients:  140 - 180 mg/dL   Lab Results  Component Value Date   GLUCAP 77 03/18/2022   HGBA1C 6.6 (H) 02/11/2022    Review of Glycemic Control  Latest Reference Range & Units 03/18/22 00:54  Glucose-Capillary 70 - 99 mg/dL 77   Diabetes history: Type 2 dM Outpatient Diabetes medications: Lantus 20 units QD Current orders for Inpatient glycemic control: none  Inpatient Diabetes Program Recommendations:    Consider adding Novolog 0-6 units Q4H.   Thanks, Bronson Curb, MSN, RNC-OB Diabetes Coordinator [phone] (8a-5p)

## 2022-03-18 NOTE — Assessment & Plan Note (Addendum)
Admit to inpatient med surg bed. Closed intertrochanteric fracture of left hip (HCC) is a Acute illness/condition that poses a threat to life or bodily function. IV dilaudid 0.5 mg for severe pain, norco 5/325 for moderate pain. Keep NPO until seen by orthopedics. LR 50 ml/hr while NPO.

## 2022-03-18 NOTE — Progress Notes (Incomplete)
Lives with Yolanda Bonine and his dog in one level home. Alert, but forgetful.

## 2022-03-18 NOTE — Assessment & Plan Note (Signed)
Verified with pt and her dtr [name] that pt is a DNR/DNI. She will need yellow DNR form completed prior to discharge.

## 2022-03-18 NOTE — Subjective & Objective (Signed)
CC: fall at home HPI: 81 year old white female history of type 2 diabetes, sick sinus syndrome status post permanent pacemaker, CKD stage IV, history of paroxysmal atrial fibrillation no longer on anticoagulation due to history of GI bleeding, hyperlipidemia, reflux, diabetic neuropathy, chronic diastolic heart failure presents to the ER today after a fall at home.  Patient states that she was getting ready for bed.  She dropped something on the ground.  She tried to pick it up.  She fell over onto her right side.  She had pain in her left hip.  Patient brought to the ER via ambulance.  Labs showed a BUN of 58, creatinine 2.43.  White count 11.1, hemoglobin 10.4, platelets 155  CT head without acute intracranial abnormality, CT spine without acute cervical fracture.  Left hip x-rays demonstrated a comminuted intertrochanteric fracture of the left femur with mild lateral angulation and superior subluxation.  EKG which I personally reviewed shows normal sinus rhythm.  EDP is contacted orthopedics.  [facility] hospitalist contacted for admission.

## 2022-03-18 NOTE — Plan of Care (Signed)

## 2022-03-18 NOTE — Progress Notes (Signed)
PROGRESS NOTE    [name]  GEX:[id number] DOB: 05-Jul-1941 DOA: 03/17/2022 PCP: Leeroy Cha, MD    Brief Narrative:   [name] is a 81 y.o. female with past medical history significant for paroxysmal atrial fibrillation, SSS s/p PPM, CAD s/p CABG x3, type 2 diabetes mellitus, hypothyroidism, hyperlipidemia, chronic diastolic congestive heart failure, essential hypertension, CKD stage IV hyperlipidemia history of GI bleed who presented to [facility] ED on 5/24 following mechanical fall at home.  Patient states was getting ready for bed, dropped an item on the ground and when she tried to pick it up she fell onto her right side.  She reported immediate pain to her left hip and was brought to the ED via EMS for further evaluation.  In the ED, temperature 97.9 degrees Fahrenheit, HR 60, RR 16, BP 168/63, SPO2 100% on room air.  Sodium 139, potassium 4.3, chloride 104, CO2 24, glucose 68, BUN 58, creatinine 2.43.  WBC 11.1, hemoglobin 10.4, platelets 155.  Urinalysis unrevealing.  CT head and C-spine with no acute intracranial injury, no calvarial fracture, no acute fracture or listhesis of the C-spine.  Pelvis/left femur with comminuted angulated intratrochanteric fracture of the proximal left femur.  Left humerus x-ray with bony deformity of the left humeral head/neck, unchanged from previous exam and likely related to old trauma, no acute fractures identified.  Orthopedics was consulted.  Hospital service consulted for further evaluation and management for acute left hip fracture.  Assessment & Plan:   Left hip fracture Patient presenting to ED via EMS following mechanical fall at home without loss of consciousness.  Imaging left pelvis/hip with comminuted angulated intratrochanteric fracture of the proximal left femur. --Orthopedics following, appreciate assistance --Plan intramedullary nail today --D5NS at 62m/hr --Norco 5-325 mg q4h PRN moderate pain --Dilaudid 0.5 mg IV q2h  PRN severe pain --N.p.o.  CKD stage IV [facility] Dr. GMoshe Ciprooutpatient. Creatinine 2.2-2.4; stable. --Cr 2.43 on admission --Avoid nephrotoxins, renal dose medications --IV fluid hydration while n.p.o. as above  Chronic diastolic congestive heart failure, compensated Follows with cardiology, Dr. HDebara Pickettoutpatient.  TTE 01/06/2022 LVEF 55-60%, LV normal function, LV has no regional wall motion normalities, mild LVH, grade 2 diastolic dysfunction, LA mildly dilated, moderate TR, mild MR, no aortic stenosis, IVC normal in size. --Carvedilol 25 mg p.o. twice daily --Imdur 30 mg p.o. daily --Torsemide 40 mg p.o. daily --Strict I's and O's and daily weights  SSS s/p PPM Followed by electrophysiology, Dr. CCurt Bearsoutpatient.  Paroxysmal atrial flutter/fibrillation No longer on anticoagulation due to prior history of GI bleed.  Type 2 diabetes mellitus Home regimen includes Lantus 20 units subcutaneously daily  Peripheral neuropathy --Gabapentin 300 mg PO qHS  CAD Prior history of three-vessel CABG 2013. --Continue aspirin, statin, beta-blocker, Imdur  Essential hypertension Home medication regimen includes carvedilol 25 mg p.o. daily, amlodipine 5 mg p.o. daily, isosorbide mononitrate 30 mg p.o. daily, torsemide 40 mg p.o. daily. --Hydralazine '10mg'$  IV q6h PRN SBP >165 or DBP >110  Hyperlipidemia --Crestor 10 mg p.o. daily  Hypothyroidism TSH 0.135 and free T for 1.50 on 02/11/2022. --Levothyroxine 100 mcg p.o. daily  Depression/anxiety --Sertraline 25 mg p.o. daily  DVT prophylaxis:     Code Status: DNR Family Communication: Updated patient's daughter MLenna Sciaraat bedside this morning  Disposition Plan:  Level of care: Med-Surg Status is: Inpatient Remains inpatient appropriate because: Pending surgical intervention for hip fracture, may need SNF placement postoperatively    Consultants:  Orthopedics, Dr.  Looney  Procedures:   None  Antimicrobials:  None   Subjective: Patient seen examined bedside, resting company.  Daughter present.  Remains in ED holding area.  Pain controlled.  Awaiting for operative management of left hip fracture later this morning.  No other specific questions or concerns at this time.  Denies headache, no dizziness, no chest pain, no palpitations, no shortness of breath, no abdominal pain, no fever/chills/night sweats, no nausea/vomiting/diarrhea, no focal weakness, no cough/congestion, no fatigue, no paresthesia.  No acute events overnight per nursing staff.  Objective: Vitals:   03/18/22 0352 03/18/22 0400 03/18/22 0600 03/18/22 0630  BP: (!) 134/103 (!) 158/49 (!) 156/97 (!) 174/83  Pulse: 60 (!) 59 (!) 59 (!) 59  Resp: '17 14 14 13  '$ Temp:      TempSrc:      SpO2: 99% 98% 98% 98%  Weight:      Height:        Intake/Output Summary (Last 24 hours) at 03/18/2022 0958 Last data filed at 03/18/2022 0902 Gross per 24 hour  Intake 0.34 ml  Output --  Net 0.34 ml   Filed Weights   03/18/22 0121  Weight: 56 kg    Examination:  Physical Exam: GEN: NAD, alert and oriented x 3, elderly in appearance HEENT: NCAT, PERRL, EOMI, sclera clear, MMM PULM: CTAB w/o wheezes/crackles, normal respiratory effort, on room air CV: RRR w/o M/G/R GI: abd soft, NTND, NABS, no R/G/M MSK: no peripheral edema, moves all extremities independently, left lower extremity shortened with external rotation NEURO: CN II-XII intact, no focal deficits, sensation to light touch intact PSYCH: normal mood/affect Integumentary: dry/intact, no rashes or wounds    Data Reviewed: I have personally reviewed following labs and imaging studies  CBC: Recent Labs  Lab 03/18/22 0040  WBC 11.1*  NEUTROABS 8.7*  HGB 10.4*  HCT 30.1*  MCV 95.6  PLT 656   Basic Metabolic Panel: Recent Labs  Lab 03/18/22 0040  NA 139  K 4.3  CL 104  CO2 24  GLUCOSE 68*  BUN 58*  CREATININE 2.43*  CALCIUM 9.0    GFR: Estimated Creatinine Clearance: 14 mL/min (A) (by C-G formula based on SCr of 2.43 mg/dL (H)). Liver Function Tests: No results for input(s): AST, ALT, ALKPHOS, BILITOT, PROT, ALBUMIN in the last 168 hours. No results for input(s): LIPASE, AMYLASE in the last 168 hours. No results for input(s): AMMONIA in the last 168 hours. Coagulation Profile: No results for input(s): INR, PROTIME in the last 168 hours. Cardiac Enzymes: No results for input(s): CKTOTAL, CKMB, CKMBINDEX, TROPONINI in the last 168 hours. BNP (last 3 results) No results for input(s): PROBNP in the last 8760 hours. HbA1C: No results for input(s): HGBA1C in the last 72 hours. CBG: Recent Labs  Lab 03/18/22 0054  GLUCAP 77   Lipid Profile: No results for input(s): CHOL, HDL, LDLCALC, TRIG, CHOLHDL, LDLDIRECT in the last 72 hours. Thyroid Function Tests: No results for input(s): TSH, T4TOTAL, FREET4, T3FREE, THYROIDAB in the last 72 hours. Anemia Panel: No results for input(s): VITAMINB12, FOLATE, FERRITIN, TIBC, IRON, RETICCTPCT in the last 72 hours. Sepsis Labs: No results for input(s): PROCALCITON, LATICACIDVEN in the last 168 hours.  No results found for this or any previous visit (from the past 240 hour(s)).       Radiology Studies: CT Head Wo Contrast  Result Date: 03/18/2022 CLINICAL DATA:  Polytrauma, blunt.  Fall, head injury EXAM: CT HEAD WITHOUT CONTRAST CT CERVICAL SPINE WITHOUT CONTRAST TECHNIQUE: Multidetector  CT imaging of the head and cervical spine was performed following the standard protocol without intravenous contrast. Multiplanar CT image reconstructions of the cervical spine were also generated. RADIATION DOSE REDUCTION: This exam was performed according to the departmental dose-optimization program which includes automated exposure control, adjustment of the mA and/or kV according to patient size and/or use of iterative reconstruction technique. COMPARISON:  None Available. FINDINGS:  CT HEAD FINDINGS Brain: Normal anatomic configuration. Parenchymal volume loss is commensurate with the patient's age. Mild periventricular white matter changes are present likely reflecting the sequela of small vessel ischemia. No abnormal intra or extra-axial mass lesion or fluid collection. No abnormal mass effect or midline shift. No evidence of acute intracranial hemorrhage or infarct. Ventricular size is normal. Cerebellum unremarkable. Vascular: No asymmetric hyperdense vasculature at the skull base. Skull: Intact Sinuses/Orbits: Paranasal sinuses are clear. Orbits are unremarkable. Other: Mastoid air cells and middle ear cavities are clear. CT CERVICAL SPINE FINDINGS Alignment: Normal. Skull base and vertebrae: Craniocervical alignment is normal. Atlantodental interval is not widened. No acute fracture of the cervical spine. Vertebral body height is preserved. Soft tissues and spinal canal: No canal hematoma. No prevertebral soft tissue swelling or paravertebral fluid collection. Extensive atherosclerotic calcification within the carotid bifurcations. No pathologic cervical adenopathy. Disc levels: Intervertebral disc space narrowing and endplate remodeling at W9-6 in keeping with changes of advanced degenerative disc disease. Mild degenerative changes are seen throughout the remainder of the cervical spine. Prevertebral soft tissues are not thickened on sagittal reformats. The spinal canal is widely patent. No significant neuroforaminal narrowing. Upper chest: Unremarkable Other: None IMPRESSION: No acute intracranial injury.  No calvarial fracture. No acute fracture or listhesis of the cervical spine. Peripheral vascular disease with extensive atherosclerotic calcification within the carotid bifurcations. Electronically Signed   By: Fidela Salisbury M.D.   On: 03/18/2022 01:59   CT Cervical Spine Wo Contrast  Result Date: 03/18/2022 CLINICAL DATA:  Polytrauma, blunt.  Fall, head injury EXAM: CT HEAD  WITHOUT CONTRAST CT CERVICAL SPINE WITHOUT CONTRAST TECHNIQUE: Multidetector CT imaging of the head and cervical spine was performed following the standard protocol without intravenous contrast. Multiplanar CT image reconstructions of the cervical spine were also generated. RADIATION DOSE REDUCTION: This exam was performed according to the departmental dose-optimization program which includes automated exposure control, adjustment of the mA and/or kV according to patient size and/or use of iterative reconstruction technique. COMPARISON:  None Available. FINDINGS: CT HEAD FINDINGS Brain: Normal anatomic configuration. Parenchymal volume loss is commensurate with the patient's age. Mild periventricular white matter changes are present likely reflecting the sequela of small vessel ischemia. No abnormal intra or extra-axial mass lesion or fluid collection. No abnormal mass effect or midline shift. No evidence of acute intracranial hemorrhage or infarct. Ventricular size is normal. Cerebellum unremarkable. Vascular: No asymmetric hyperdense vasculature at the skull base. Skull: Intact Sinuses/Orbits: Paranasal sinuses are clear. Orbits are unremarkable. Other: Mastoid air cells and middle ear cavities are clear. CT CERVICAL SPINE FINDINGS Alignment: Normal. Skull base and vertebrae: Craniocervical alignment is normal. Atlantodental interval is not widened. No acute fracture of the cervical spine. Vertebral body height is preserved. Soft tissues and spinal canal: No canal hematoma. No prevertebral soft tissue swelling or paravertebral fluid collection. Extensive atherosclerotic calcification within the carotid bifurcations. No pathologic cervical adenopathy. Disc levels: Intervertebral disc space narrowing and endplate remodeling at P5-9 in keeping with changes of advanced degenerative disc disease. Mild degenerative changes are seen throughout the remainder of the cervical spine. Prevertebral  soft tissues are not  thickened on sagittal reformats. The spinal canal is widely patent. No significant neuroforaminal narrowing. Upper chest: Unremarkable Other: None IMPRESSION: No acute intracranial injury.  No calvarial fracture. No acute fracture or listhesis of the cervical spine. Peripheral vascular disease with extensive atherosclerotic calcification within the carotid bifurcations. Electronically Signed   By: Fidela Salisbury M.D.   On: 03/18/2022 01:59   DG Humerus Left  Result Date: 03/18/2022 CLINICAL DATA:  Fall with deformity. EXAM: LEFT HUMERUS - 2+ VIEW COMPARISON:  11/23/2016, 02/10/2022. FINDINGS: There is deformity of the left humeral head/neck which is improved from the prior exam and likely related to old trauma. No acute fracture or dislocation is seen. Degenerative changes are present at the acromioclavicular joint. IMPRESSION: Bony deformity of the left humeral head/neck, unchanged from the previous exam and likely related to old trauma. No acute fracture is identified. Electronically Signed   By: Brett Fairy M.D.   On: 03/18/2022 00:43   DG Hip Unilat With Pelvis 2-3 Views Left  Result Date: 03/18/2022 CLINICAL DATA:  Fall, with deformity. EXAM: LEFT FEMUR 1 VIEW; DG HIP (WITH OR WITHOUT PELVIS) 2-3V LEFT COMPARISON:  05/17/2019. FINDINGS: There is a comminuted intertrochanteric fracture of the left hip with mild lateral angulation and superior subluxation of the distal fracture fragment. No dislocation is seen. The remaining bony structures appear intact. Total knee arthroplasty changes are noted on the left without evidence of hardware loosening. Mild degenerative changes are noted in the lower lumbar spine. Vascular calcifications are noted in the pelvis and lower extremities bilaterally. IMPRESSION: Comminuted angulated intratrochanteric fracture of the proximal left femur. Electronically Signed   By: Brett Fairy M.D.   On: 03/18/2022 00:39   DG Femur 1V Left  Result Date: 03/18/2022 CLINICAL  DATA:  Fall, with deformity. EXAM: LEFT FEMUR 1 VIEW; DG HIP (WITH OR WITHOUT PELVIS) 2-3V LEFT COMPARISON:  05/17/2019. FINDINGS: There is a comminuted intertrochanteric fracture of the left hip with mild lateral angulation and superior subluxation of the distal fracture fragment. No dislocation is seen. The remaining bony structures appear intact. Total knee arthroplasty changes are noted on the left without evidence of hardware loosening. Mild degenerative changes are noted in the lower lumbar spine. Vascular calcifications are noted in the pelvis and lower extremities bilaterally. IMPRESSION: Comminuted angulated intratrochanteric fracture of the proximal left femur. Electronically Signed   By: Brett Fairy M.D.   On: 03/18/2022 00:39        Scheduled Meds:  Continuous Infusions:  lactated ringers Stopped (03/18/22 0626)     LOS: 0 days    Time spent: 52 minutes spent on chart review, discussion with nursing staff, consultants, updating family and interview/physical exam; more than 50% of that time was spent in counseling and/or coordination of care.    Yaiza Palazzola J [place] DO [facility] Available via Epic secure chat 7am-7pm After these hours, please refer to coverage provider listed on [email or web address] 03/18/2022, 9:58 AM

## 2022-03-19 ENCOUNTER — Encounter (HOSPITAL_COMMUNITY): Payer: Self-pay | Admitting: Orthopedic Surgery

## 2022-03-19 DIAGNOSIS — N184 Chronic kidney disease, stage 4 (severe): Secondary | ICD-10-CM | POA: Diagnosis not present

## 2022-03-19 DIAGNOSIS — I5032 Chronic diastolic (congestive) heart failure: Secondary | ICD-10-CM | POA: Diagnosis not present

## 2022-03-19 DIAGNOSIS — S72142A Displaced intertrochanteric fracture of left femur, initial encounter for closed fracture: Secondary | ICD-10-CM | POA: Diagnosis not present

## 2022-03-19 DIAGNOSIS — E440 Moderate protein-calorie malnutrition: Secondary | ICD-10-CM | POA: Insufficient documentation

## 2022-03-19 DIAGNOSIS — Z950 Presence of cardiac pacemaker: Secondary | ICD-10-CM | POA: Diagnosis not present

## 2022-03-19 LAB — CBC
HCT: 20.8 % — ABNORMAL LOW (ref 36.0–46.0)
Hemoglobin: 7.1 g/dL — ABNORMAL LOW (ref 12.0–15.0)
MCH: 32.7 pg (ref 26.0–34.0)
MCHC: 34.1 g/dL (ref 30.0–36.0)
MCV: 95.9 fL (ref 80.0–100.0)
Platelets: 106 10*3/uL — ABNORMAL LOW (ref 150–400)
RBC: 2.17 MIL/uL — ABNORMAL LOW (ref 3.87–5.11)
RDW: 13.8 % (ref 11.5–15.5)
WBC: 6.2 10*3/uL (ref 4.0–10.5)
nRBC: 0 % (ref 0.0–0.2)

## 2022-03-19 LAB — GLUCOSE, CAPILLARY
Glucose-Capillary: 257 mg/dL — ABNORMAL HIGH (ref 70–99)
Glucose-Capillary: 256 mg/dL — ABNORMAL HIGH (ref 70–99)
Glucose-Capillary: 305 mg/dL — ABNORMAL HIGH (ref 70–99)
Glucose-Capillary: 211 mg/dL — ABNORMAL HIGH (ref 70–99)
Glucose-Capillary: 177 mg/dL — ABNORMAL HIGH (ref 70–99)

## 2022-03-19 LAB — BASIC METABOLIC PANEL
Anion gap: 10 (ref 5–15)
BUN: 63 mg/dL — ABNORMAL HIGH (ref 8–23)
CO2: 19 mmol/L — ABNORMAL LOW (ref 22–32)
Calcium: 8 mg/dL — ABNORMAL LOW (ref 8.9–10.3)
Chloride: 103 mmol/L (ref 98–111)
Creatinine, Ser: 2.84 mg/dL — ABNORMAL HIGH (ref 0.44–1.00)
GFR, Estimated: 16 mL/min — ABNORMAL LOW (ref >60)
Glucose, Bld: 310 mg/dL — ABNORMAL HIGH (ref 70–99)
Potassium: 4.2 mmol/L (ref 3.5–5.1)
Sodium: 132 mmol/L — ABNORMAL LOW (ref 135–145)

## 2022-03-19 LAB — HEMOGLOBIN AND HEMATOCRIT, BLOOD
HCT: 26.8 % — ABNORMAL LOW (ref 36.0–46.0)
Hemoglobin: 9.5 g/dL — ABNORMAL LOW (ref 12.0–15.0)

## 2022-03-19 LAB — PREPARE RBC (CROSSMATCH)

## 2022-03-19 MED ORDER — INSULIN GLARGINE-YFGN 100 UNIT/ML ~~LOC~~ SOLN
25.0000 [IU] | Freq: Every day | SUBCUTANEOUS | Status: DC
Start: 2022-03-19 — End: 2022-03-22
  Administered 2022-03-19 – 2022-03-21 (×3): 25 [IU] via SUBCUTANEOUS
  Filled 2022-03-19 (×4): qty 0.25

## 2022-03-19 MED ORDER — ENOXAPARIN SODIUM 30 MG/0.3ML IJ SOSY: 30.0000 mg | PREFILLED_SYRINGE | INTRAMUSCULAR | 0 refills | Status: DC

## 2022-03-19 MED ORDER — CHLORHEXIDINE GLUCONATE CLOTH 2 % EX PADS
6.0000 | MEDICATED_PAD | Freq: Every day | CUTANEOUS | Status: DC
  Administered 2022-03-19 – 2022-03-23 (×4): 6 via TOPICAL

## 2022-03-19 MED ORDER — HYDROCODONE-ACETAMINOPHEN 5-325 MG PO TABS: 1.0000 | ORAL_TABLET | ORAL | 0 refills | Status: DC | PRN

## 2022-03-19 MED ORDER — SODIUM CHLORIDE 0.9% IV SOLUTION: Freq: Once | INTRAVENOUS | Status: AC

## 2022-03-19 MED ORDER — ADULT MULTIVITAMIN W/MINERALS CH
1.0000 | ORAL_TABLET | Freq: Every day | ORAL | Status: DC
  Administered 2022-03-19 – 2022-03-23 (×5): 1 via ORAL
  Filled 2022-03-19 (×5): qty 1

## 2022-03-19 MED ORDER — BOOST / RESOURCE BREEZE PO LIQD CUSTOM
1.0000 | Freq: Two times a day (BID) | ORAL | Status: DC
  Administered 2022-03-19 – 2022-03-23 (×6): 1 via ORAL

## 2022-03-19 MED ORDER — ALUM & MAG HYDROXIDE-SIMETH 200-200-20 MG/5ML PO SUSP
30.0000 mL | ORAL | Status: DC | PRN
  Administered 2022-03-19: 30 mL via ORAL
  Filled 2022-03-19: qty 30

## 2022-03-19 MED ORDER — INSULIN ASPART 100 UNIT/ML IJ SOLN
0.0000 [IU] | Freq: Three times a day (TID) | INTRAMUSCULAR | Status: DC
  Administered 2022-03-19: 11 [IU] via SUBCUTANEOUS
  Administered 2022-03-19: 5 [IU] via SUBCUTANEOUS
  Administered 2022-03-19: 8 [IU] via SUBCUTANEOUS
  Administered 2022-03-20 (×2): 3 [IU] via SUBCUTANEOUS
  Administered 2022-03-20: 2 [IU] via SUBCUTANEOUS
  Administered 2022-03-21: 3 [IU] via SUBCUTANEOUS
  Administered 2022-03-21 – 2022-03-22 (×2): 2 [IU] via SUBCUTANEOUS
  Administered 2022-03-22: 5 [IU] via SUBCUTANEOUS
  Administered 2022-03-23 (×2): 2 [IU] via SUBCUTANEOUS

## 2022-03-19 NOTE — Progress Notes (Signed)
Inpatient Rehab Admissions Coordinator:   Per PT recommendations pt was screened for CIR by Shann Medal, PT, DPT.  [employer] Medicare unlikely to approve CIR for primary diagnosis of hip fracture.  Recommend TOC investigate alternative venue for rehab needs.    Shann Medal, PT, DPT Admissions Coordinator [phone] 03/19/22  2:46 PM

## 2022-03-19 NOTE — Progress Notes (Signed)
Orthopaedics Daily Progress Note   03/19/2022   8:21 AM  [name] is a 81 y.o. female 1 Day Post-Op s/p INTRAMEDULLARY (IM) NAIL INTERTROCHANTRIC  Subjective Pain appropriately controlled.  Denies nausea, vomiting, or fevers.   Objective Vitals:   03/19/22 0721 03/19/22 0745  BP: (!) 155/55 132/65  Pulse: 64 64  Resp: 18 18  Temp: 98 F (36.7 C) 98.3 F (36.8 C)  SpO2: 98% 99%    Intake/Output Summary (Last 24 hours) at 03/19/2022 0940 Last data filed at 03/18/2022 1952 Gross per 24 hour  Intake 500.34 ml  Output 300 ml  Net 200.34 ml    Physical Exam LLE: Dressing clean, dry, and intact +DF/PF/EHL Diminished sensation distally baseline due to neuropath; no changes SP/DP/T from preop +DP/PT and WWP distally  Assessment 81 y.o. female s/p Procedure(s) (LRB): INTRAMEDULLARY (IM) NAIL INTERTROCHANTRIC (Left)  Plan Mobility: Out of bed with PT/OT Pain control: Continue to wean/titrate to appropriate oral regimen DVT Prophylaxis: Lovenox x6 weeks postoperatively Further surgical plans: None RUE: Weightbearing as tolerated, no restrictions LUE: Weightbearing as tolerated, no restrictions RLE: Weightbearing as tolerated, no restrictions LLE: Weightbearing as tolerated, no restrictions Disposition: Per primary team as medically appropriate Dressing care: Keep AQUACEL on and dry for up to 14 days.  Do not allow surgical area to get wet before that.  Remove AQUACEL dressing after 14 days and allow area to get wet in shower but DO NOT SUBMERGE until wound is evaluated in clinic.  In most cases skin glue is used and no additional dressing is necessary.  Follow-up: Please call [facility] [phone]) to schedule follow-op appointment for 2 weeks after surgery.  I have verified that my discharge instructions and follow-up information have been entered in the Discharge Navigator in Epic.  These should automatically populate in the AVS.   Please print the AVS in its entirety and ensure that the patient or a responsible party has a complete copy of the AVS before they are discharged.  If there are questions regarding discharge instructions or follow-up before the AVS is generated, please check the Discharge Navigator before attempting to contact the surgeon/office.  If unsure how to access the Discharge Navigator or the information contained in the Discharge Navigator, or how to generate/print the AVS, please contact the appropriate Nurse, learning disability.   The patient's postoperative prescriptions (e.g. pain medication, anticoagulation) have been printed, signed, and placed on the patient's hard chart.     Georgeanna Harrison M.D. Orthopaedic Surgery [facility]

## 2022-03-19 NOTE — Evaluation (Signed)
Occupational Therapy Evaluation Patient Details Name: [name] MRN: [id number] DOB: 1941-02-26 Today's Date: 03/19/2022   History of Present Illness 81 y.o. female  who presents after mechanical fall on 03/17/22. L IM nail completed on 03/18/22. PMH significant of HOH, CKD, paroxsymal atrial fibrillation, SSS s/p pacemaker, CAD s/p CABG x3, insulin-dependent N8GN, chronic diastolic HF, HTN, and HLD   Clinical Impression   Prior to this admission, patient living with grandson but independent in ADLs and IADLs. Currently, patient requiring mod A for ADLs and transfers, and unable to stand with OT. Patient seen immediately after PT just getting patient to recliner therefore may not be an accurate depiction of performance. Patient noted to have her L knee inverted when attempting to stand, with inability to bring it fully under her when attempting to stand x4 despite OT blocking at the knee. OT recommending AIR consult due to previous level of function, rehab potential, and family support. OT will continue to follow acutely.      Recommendations for follow up therapy are one component of a multi-disciplinary discharge planning process, led by the attending physician.  Recommendations may be updated based on patient status, additional functional criteria and insurance authorization.   Follow Up Recommendations  Acute inpatient rehab (3hours/day)    Assistance Recommended at Discharge Intermittent Supervision/Assistance  Patient can return home with the following A lot of help with walking and/or transfers;A lot of help with bathing/dressing/bathroom;Assistance with cooking/housework;Assist for transportation;Help with stairs or ramp for entrance    Functional Status Assessment  Patient has had a recent decline in their functional status and demonstrates the ability to make significant improvements in function in a reasonable and predictable amount of time.  Equipment Recommendations  Other  (comment) (Defer to next venue)    Recommendations for Other Services Rehab consult     Precautions / Restrictions Precautions Precautions: Fall Restrictions Weight Bearing Restrictions: Yes LLE Weight Bearing: Weight bearing as tolerated      Mobility Bed Mobility               General bed mobility comments: up in chair upon arrival    Transfers Overall transfer level: Needs assistance Equipment used: Rolling walker (2 wheels) Transfers: Sit to/from Stand Sit to Stand: Mod assist           General transfer comment: unable to come fully into standing with OT, L leg inverting at L knee      Balance Overall balance assessment: Needs assistance Sitting-balance support: Feet supported, Bilateral upper extremity supported Sitting balance-Leahy Scale: Poor     Standing balance support: Bilateral upper extremity supported, During functional activity, Reliant on assistive device for balance Standing balance-Leahy Scale: Poor Standing balance comment: reliant on RW                           ADL either performed or assessed with clinical judgement   ADL Overall ADL's : Needs assistance/impaired Eating/Feeding: Set up;Sitting   Grooming: Set up;Sitting   Upper Body Bathing: Minimal assistance;Sitting   Lower Body Bathing: Moderate assistance;Maximal assistance;Sitting/lateral leans;Sit to/from stand   Upper Body Dressing : Minimal assistance;Sitting   Lower Body Dressing: Moderate assistance;Maximal assistance;Sitting/lateral leans;Sit to/from stand   Toilet Transfer: Moderate assistance;Maximal assistance;Stand-pivot;Rolling walker (2 wheels)   Toileting- Clothing Manipulation and Hygiene: Minimal assistance;Sitting/lateral lean;Sit to/from stand       Functional mobility during ADLs: Moderate assistance;Maximal assistance;Cueing for sequencing;Cueing for safety;Rolling walker (2 wheels) General ADL  Comments: Patient presenting with pain and  decreased activity tolerance     Vision Baseline Vision/History: 0 No visual deficits Ability to See in Adequate Light: 0 Adequate Patient Visual Report: No change from baseline       Perception     Praxis      Pertinent Vitals/Pain Pain Assessment Pain Assessment: Faces Faces Pain Scale: Hurts even more Pain Location: L hip with mobility Pain Descriptors / Indicators: Aching, Discomfort, Guarding, Grimacing Pain Intervention(s): Limited activity within patient's tolerance, Monitored during session, Repositioned     Hand Dominance Right   Extremity/Trunk Assessment Upper Extremity Assessment Upper Extremity Assessment: Generalized weakness;LUE deficits/detail LUE Deficits / Details: Decreased AROM due to previous shoulder instability (fx from fall) and arthritis LUE: Shoulder pain with ROM LUE Sensation: WNL LUE Coordination: decreased gross motor   Lower Extremity Assessment Lower Extremity Assessment: Defer to PT evaluation LLE: Unable to fully assess due to pain LLE Coordination: decreased gross motor   Cervical / Trunk Assessment Cervical / Trunk Assessment: Kyphotic (Minimally)   Communication Communication Communication: HOH   Cognition Arousal/Alertness: Awake/alert Behavior During Therapy: WFL for tasks assessed/performed Overall Cognitive Status: Within Functional Limits for tasks assessed                                       General Comments       Exercises     Shoulder Instructions      Home Living Family/patient expects to be discharged to:: Private residence Living Arrangements: Other relatives (Grandson stays at house but works) Available Help at Discharge: Family;Available PRN/intermittently Type of Home: House Home Access: Stairs to enter [employer] of Steps: 2 Entrance Stairs-Rails: None Home Layout: One level     Bathroom Shower/Tub: Occupational psychologist: Standard Bathroom Accessibility:  Yes   Home Equipment: Shower seat;BSC/3in1;Other (comment);Cane - single point;[place] (2 wheels) (has an adjustable bed without rails)          Prior Functioning/Environment Prior Level of Function : Independent/Modified Independent             Mobility Comments: used walker or cane in home ADLs Comments: independent with ADLS with extra time        OT Problem List: Decreased range of motion;Decreased strength;Decreased activity tolerance;Impaired balance (sitting and/or standing);Decreased coordination;Decreased safety awareness;Decreased knowledge of use of DME or AE;Decreased knowledge of precautions;Pain      OT Treatment/Interventions: Self-care/ADL training;Therapeutic exercise;Energy conservation;DME and/or AE instruction;Manual therapy;Therapeutic activities;Patient/family education;Balance training    OT Goals(Current goals can be found in the care plan section) Acute Rehab OT Goals Patient Stated Goal: to get better OT Goal Formulation: With patient/family Time For Goal Achievement: 04/01/22 Potential to Achieve Goals: Good ADL Goals Pt Will Perform Lower Body Bathing: sitting/lateral leans;with adaptive equipment;sit to/from stand;with min assist Pt Will Perform Lower Body Dressing: with min assist;sitting/lateral leans;sit to/from stand;with adaptive equipment Pt Will Transfer to Toilet: with min assist;ambulating Additional ADL Goal #1: Patient will be able to verbalize 3 strategies for fall prevention for safe discharge home.  OT Frequency: Min 2X/week    Co-evaluation              AM-PAC OT "6 Clicks" Daily Activity     Outcome Measure Help from another person eating meals?: A Little Help from another person taking care of personal grooming?: A Little Help from another person toileting, which  includes using toliet, bedpan, or urinal?: A Lot Help from another person bathing (including washing, rinsing, drying)?: A Lot Help from  another person to put on and taking off regular upper body clothing?: A Little Help from another person to put on and taking off regular lower body clothing?: A Lot 6 Click Score: 15   End of Session Equipment Utilized During Treatment: Gait belt;Rolling walker (2 wheels) Nurse Communication: Mobility status;Other (comment) (Need for stedy to return)  Activity Tolerance: Patient limited by fatigue;Patient limited by lethargy;Patient limited by pain Patient left: in chair;with call bell/phone within reach;with chair alarm set  OT Visit Diagnosis: Unsteadiness on feet (R26.81);Other abnormalities of gait and mobility (R26.89);Repeated falls (R29.6);Muscle weakness (generalized) (M62.81);History of falling (Z91.81);Pain Pain - Right/Left: Left Pain - part of body: Hip                Time: 8315-1761 OT Time Calculation (min): 24 min Charges:  OT General Charges $OT Visit: 1 Visit OT Evaluation $OT Eval Moderate Complexity: 1 Mod OT Treatments $Self Care/Home Management : 8-22 mins  Corinne Ports E. Loneta Tamplin, OTR/L Acute Rehabilitation Services [place] 03/19/2022, 3:20 PM

## 2022-03-19 NOTE — Evaluation (Signed)
Physical Therapy Evaluation Patient Details Name: [name] MRN: [id number] DOB: October 22, 1941 Today's Date: 03/19/2022  History of Present Illness  81 year old white female history of type 2 diabetes, sick sinus syndrome status post permanent pacemaker, CKD stage IV, history of paroxysmal atrial fibrillation no longer on anticoagulation due to history of GI bleeding, hyperlipidemia, reflux, diabetic neuropathy, chronic diastolic heart failure presents to the ER today after a fall at home.  Patient states that she was getting ready for bed.  She dropped something on the ground.  She tried to pick it up.  She fell over onto her right side.  She had pain in her left hip.  S/P ORIF 03/18/22.   Clinical Impression  Patient received in bed, she got blood transfusion earlier today. She is agreeable to PT assessment, but hesitant to move. Patient required mod A for bed mobility due to pain. Mod A with sit to stand from slightly elevated bed. Mod A with moving to recliner from bed with RW. Pain and fatigue limited. She will continue to benefit from skilled PT to improve functional independence, strength and safety with mobility.         Recommendations for follow up therapy are one component of a multi-disciplinary discharge planning process, led by the attending physician.  Recommendations may be updated based on patient status, additional functional criteria and insurance authorization.  Follow Up Recommendations Acute inpatient rehab (3hours/day)    Assistance Recommended at Discharge Frequent or constant Supervision/Assistance  Patient can return home with the following  A lot of help with walking and/or transfers;A lot of help with bathing/dressing/bathroom;Help with stairs or ramp for entrance;Assist for transportation;Assistance with cooking/housework    Equipment Recommendations None recommended by PT  Recommendations for Other Services  Rehab consult    Functional Status Assessment Patient  has had a recent decline in their functional status and demonstrates the ability to make significant improvements in function in a reasonable and predictable amount of time.     Precautions / Restrictions Precautions Precautions: Fall Restrictions Weight Bearing Restrictions: Yes LLE Weight Bearing: Weight bearing as tolerated      Mobility  Bed Mobility Overal bed mobility: Needs Assistance Bed Mobility: Supine to Sit     Supine to sit: Mod assist, HOB elevated          Transfers Overall transfer level: Needs assistance Equipment used: Rolling walker (2 wheels) Transfers: Sit to/from Stand Sit to Stand: Mod assist, From elevated surface                Ambulation/Gait Ambulation/Gait assistance: Mod assist Gait Distance (Feet): 3 Feet Assistive device: Rolling walker (2 wheels) Gait Pattern/deviations: Step-to pattern, Decreased step length - right, Decreased step length - left, Decreased weight shift to left Gait velocity: decr     General Gait Details: patient anxious and shaky with mobility, but she is able to take a few steps from bed to recliner with min A  Stairs            Wheelchair Mobility    Modified Rankin (Stroke Patients Only)       Balance Overall balance assessment: Needs assistance Sitting-balance support: Feet supported Sitting balance-Leahy Scale: Fair     Standing balance support: Bilateral upper extremity supported, During functional activity, Reliant on assistive device for balance Standing balance-Leahy Scale: Fair Standing balance comment: reliant on RW and min A with balance  Pertinent Vitals/Pain Pain Assessment Pain Assessment: Faces Faces Pain Scale: Hurts even more Pain Location: L hip with mobility Pain Descriptors / Indicators: Aching, Discomfort, Guarding, Grimacing Pain Intervention(s): Monitored during session, Repositioned, Limited activity within patient's tolerance     Home Living Family/patient expects to be discharged to:: Inpatient rehab Living Arrangements: Alone                      Prior Function Prior Level of Function : Independent/Modified Independent             Mobility Comments: used walker or cane in home ADLs Comments: independent with ADLS     Hand Dominance   Dominant Hand: Right    Extremity/Trunk Assessment   Upper Extremity Assessment Upper Extremity Assessment: Defer to OT evaluation    Lower Extremity Assessment Lower Extremity Assessment: LLE deficits/detail LLE: Unable to fully assess due to pain LLE Coordination: decreased gross motor    Cervical / Trunk Assessment Cervical / Trunk Assessment: Normal  Communication   Communication: HOH  Cognition Arousal/Alertness: Awake/alert Behavior During Therapy: WFL for tasks assessed/performed Overall Cognitive Status: Within Functional Limits for tasks assessed                                          General Comments      Exercises     Assessment/Plan    PT Assessment Patient needs continued PT services  PT Problem List Decreased strength;Decreased mobility;Decreased safety awareness;Decreased activity tolerance;Decreased balance;Decreased range of motion;Decreased coordination;Decreased knowledge of use of DME;Pain       PT Treatment Interventions DME instruction;Therapeutic exercise;Gait training;Balance training;Functional mobility training;Therapeutic activities;Patient/family education    PT Goals (Current goals can be found in the Care Plan section)  Acute Rehab PT Goals Patient Stated Goal: to improve, return home PT Goal Formulation: With patient/family Time For Goal Achievement: 04/02/22 Potential to Achieve Goals: Good    Frequency Min 3X/week     Co-evaluation               AM-PAC PT "6 Clicks" Mobility  Outcome Measure Help needed turning from your back to your side while in a flat bed without  using bedrails?: A Lot Help needed moving from lying on your back to sitting on the side of a flat bed without using bedrails?: A Lot Help needed moving to and from a bed to a chair (including a wheelchair)?: A Lot Help needed standing up from a chair using your arms (e.g., wheelchair or bedside chair)?: A Lot Help needed to walk in hospital room?: A Lot Help needed climbing 3-5 steps with a railing? : A Lot 6 Click Score: 12    End of Session Equipment Utilized During Treatment: Gait belt Activity Tolerance: Patient limited by fatigue;Patient limited by pain Patient left: in chair;with call bell/phone within reach;with chair alarm set;with family/visitor present Nurse Communication: Mobility status PT Visit Diagnosis: Unsteadiness on feet (R26.81);Other abnormalities of gait and mobility (R26.89);Muscle weakness (generalized) (M62.81);Repeated falls (R29.6);Difficulty in walking, not elsewhere classified (R26.2);Pain Pain - Right/Left: Left Pain - part of body: Leg    Time: 1315-1335 PT Time Calculation (min) (ACUTE ONLY): 20 min   Charges:   PT Evaluation $PT Eval Moderate Complexity: 1 Mod PT Treatments $Gait Training: 8-22 mins        Nyonna Hargrove, PT, GCS 03/19/22,2:19 PM

## 2022-03-19 NOTE — Progress Notes (Addendum)
Initial Nutrition Assessment  DOCUMENTATION CODES:   Non-severe (moderate) malnutrition in context of chronic illness  INTERVENTION:   Boost Breeze po BID, each supplement provides 250 kcal and 9 grams of protein.  MVI with minerals daily.  NUTRITION DIAGNOSIS:   Moderate Malnutrition related to chronic illness (CHF, CKD) as evidenced by moderate muscle depletion, moderate fat depletion.  GOAL:   Patient will meet greater than or equal to 90% of their needs  MONITOR:   PO intake, Supplement acceptance  REASON FOR ASSESSMENT:   Consult Hip fracture protocol  ASSESSMENT:   81 yo female admitted with L hip fracture S/P fall at home. PMH includes PAF, SSS S/P PPM, CAD, DM-2, hypothyroidism, HLD, CHF, HTN, CKD, mild vascular neurocognitive disorder.  5/25 - S/P L hip repair.  Patient reports good intake of breakfast today. Her usual intake at home is coffee, toast, eggs for breakfast, leftovers, soup, or sandwich for lunch, and a meat and vegetables for dinner. Sometimes she skips lunch. She has noticed some weight loss PTA, but unable to quantify. Usual weight is 120-130 lbs. She does not like Ensure or Boost supplements, but agreed to try [employer].  Weight history reviewed. Patient has lost 5% of usual weight within the past 2-3 months, which is concerning, but not significant for the time frame.   Labs reviewed. Na 132, phos 4.9 CBG: 257-256  Medications reviewed and include Colace, Novolog, Semglee.   NUTRITION - FOCUSED PHYSICAL EXAM:  Flowsheet Row Most Recent Value  Orbital Region Moderate depletion  Upper Arm Region Moderate depletion  Thoracic and Lumbar Region Moderate depletion  Buccal Region Moderate depletion  Temple Region Moderate depletion  Clavicle Bone Region Moderate depletion  Clavicle and Acromion Bone Region Moderate depletion  Scapular Bone Region Mild depletion  Dorsal Hand Moderate depletion  Patellar Region Moderate depletion   Anterior Thigh Region Moderate depletion  Posterior Calf Region Moderate depletion  Edema (RD Assessment) None  Hair Reviewed  Eyes Reviewed  Mouth Reviewed  Skin Reviewed  Nails Reviewed       Diet Order:   Diet Order             Diet Carb Modified Fluid consistency: Thin; Room service appropriate? Yes  Diet effective now                   EDUCATION NEEDS:      Skin:  Skin Assessment: Reviewed RN Assessment  Last BM:  no BM documented  Height:   Ht Readings from Last 1 Encounters:  03/18/22 5' (1.524 m)    Weight:   Wt Readings from Last 1 Encounters:  03/18/22 57.8 kg    Ideal Body Weight:  45.5 kg  BMI:  Body mass index is 24.89 kg/m.  Estimated Nutritional Needs:   Kcal:  1500-1700  Protein:  75-90 gm  Fluid:  1.5-1.7 L    Lucas Mallow RD, LDN, CNSC Please refer to Amion for contact information.

## 2022-03-19 NOTE — Progress Notes (Signed)
Inpatient Diabetes Program Recommendations  AACE/ADA: New Consensus Statement on Inpatient Glycemic Control (2015)  Target Ranges:  Prepandial:   less than 140 mg/dL      Peak postprandial:   less than 180 mg/dL (1-2 hours)      Critically ill patients:  140 - 180 mg/dL    Latest Reference Range & Units 03/18/22 23:33 03/19/22 04:23 03/19/22 08:19 03/19/22 11:20  Glucose-Capillary 70 - 99 mg/dL 343 (H) 257 (H) 256 (H)  13 units Novolog  305 (H)  11 units Novolog   (H): Data is abnormally high     Home DM Meds:  Lantus 20 units daily  Current Orders: Semglee 20 units QHS     Novolog Moderate Correction Scale/ SSI (0-15 units) TID AC          MD- Note Lantus 20 units QHS started last PM  Please consider:  1. Increase Lantus to 25 units QHS  2. Start Novolog Meal Coverage: Novolog 4 units TID with meals HOLD of pt eats <50% meals   --Will follow patient during hospitalization--  Wyn Quaker RN, MSN, CDE Diabetes Coordinator Inpatient Glycemic Control Team Team Pager: [phone] (8a-5p)

## 2022-03-19 NOTE — Progress Notes (Addendum)
PROGRESS NOTE    [name]  HQI:[id number] DOB: 1941/09/27 DOA: 03/17/2022 PCP: Leeroy Cha, MD    Brief Narrative:   [name] is a 81 y.o. female with past medical history significant for paroxysmal atrial fibrillation, SSS s/p PPM, CAD s/p CABG x3, type 2 diabetes mellitus, hypothyroidism, hyperlipidemia, chronic diastolic congestive heart failure, essential hypertension, CKD stage IV hyperlipidemia history of GI bleed who presented to [facility] ED on 5/24 following mechanical fall at home.  Patient states was getting ready for bed, dropped an item on the ground and when she tried to pick it up she fell onto her right side.  She reported immediate pain to her left hip and was brought to the ED via EMS for further evaluation.  In the ED, temperature 97.9 degrees Fahrenheit, HR 60, RR 16, BP 168/63, SPO2 100% on room air.  Sodium 139, potassium 4.3, chloride 104, CO2 24, glucose 68, BUN 58, creatinine 2.43.  WBC 11.1, hemoglobin 10.4, platelets 155.  Urinalysis unrevealing.  CT head and C-spine with no acute intracranial injury, no calvarial fracture, no acute fracture or listhesis of the C-spine.  Pelvis/left femur with comminuted angulated intratrochanteric fracture of the proximal left femur.  Left humerus x-ray with bony deformity of the left humeral head/neck, unchanged from previous exam and likely related to old trauma, no acute fractures identified.  Orthopedics was consulted.  Hospital service consulted for further evaluation and management for acute left hip fracture.  Assessment & Plan:   Left hip fracture Patient presenting to ED via EMS following mechanical fall at home without loss of consciousness.  Imaging left pelvis/hip with comminuted angulated intratrochanteric fracture of the proximal left femur.  Orthopedics was consulted and patient underwent left intratrochanteric intramedullary nail by Dr. Mable Fill on 03/19/2022. --Weightbearing as tolerates left lower  extremity --Norco 5-325 mg q4h PRN moderate pain --Dilaudid 0.5 mg IV q2h PRN severe pain --DVT prophylaxis with Lovenox x6 weeks postoperatively per orthopedics --Pending PT/OT evaluation, suspect will need SNF placement --Outpatient follow-up with [facility] 2 weeks postop  Acute postoperative blood loss anemia --Hgb 10.4>7.1 --Transfuse 1 unit PRBC today, goal Hgb >8.0 given cardiac history --Repeat H&H 2 hours posttransfusion --CBC in a.m.  [name] on CKD stage IV Follows with [facility], Dr. Moshe Cipro outpatient. Creatinine baseline 2.2-2.4 --Cr 2.43>2.84 --Avoid nephrotoxins, renally dose medications --Holding home torsemide --Repeat BMP in a.m.  Chronic diastolic congestive heart failure, compensated Follows with cardiology, Dr. Debara Pickett outpatient.  TTE 01/06/2022 LVEF 55-60%, LV normal function, LV has no regional wall motion normalities, mild LVH, grade 2 diastolic dysfunction, LA mildly dilated, moderate TR, mild MR, no aortic stenosis, IVC normal in size. --Carvedilol 25 mg p.o. twice daily --Imdur 30 mg p.o. daily --Holding home torsemide 40 mg p.o. daily in the setting of increased creatinine today --Strict I's and O's and daily weights  SSS s/p PPM Followed by electrophysiology, Dr. Curt Bears outpatient.  Paroxysmal atrial flutter/fibrillation No longer on anticoagulation due to prior history of GI bleed.  Type 2 diabetes mellitus, with hyperglycemia Home regimen includes Lantus 20 units subcutaneously daily --Semglee 25 units obediently daily --SSI for coverage --CBGs qAC/HS  Peripheral neuropathy --Gabapentin 300 mg PO qHS  CAD Prior history of three-vessel CABG 2013. --Continue aspirin, statin, beta-blocker, Imdur  Essential hypertension Home medication regimen includes carvedilol 25 mg p.o. daily, amlodipine 5 mg p.o. daily, isosorbide mononitrate 30 mg p.o. daily, torsemide 40 mg p.o. daily. --Continue carvedilol, amlodipine,  isosorbide mononitrate --Holding home torsemide as  above --Hydralazine '10mg'$  IV q6h PRN SBP >165 or DBP >110  Hyperlipidemia --Crestor 10 mg p.o. daily  Hypothyroidism TSH 0.135 and free T for 1.50 on 02/11/2022. --Levothyroxine 100 mcg p.o. daily  Depression/anxiety --Sertraline 25 mg p.o. daily  DVT prophylaxis: enoxaparin (LOVENOX) injection 30 mg Start: 03/19/22 1000 SCDs Start: 03/18/22 2031 SCDs Start: 03/18/22 1435 SCDs Start: 03/18/22 1009    Code Status: DNR Family Communication: No family present at bedside this morning, updated [name] yesterday  Disposition Plan:  Level of care: Med-Surg Status is: Inpatient Remains inpatient appropriate because: Pending surgical intervention for hip fracture, may need SNF placement postoperatively    Consultants:  Orthopedics, Dr. Mable Fill  Procedures:  Left intertrochanteric intramedullary nail, Dr. Mable Fill 5/25  Antimicrobials:  Perioperative cefazolin   Subjective: Patient seen examined bedside, resting comfortably in bed.  Pain controlled.  Eating breakfast.  No family present.  Awaiting PT/OT evaluation.  Discussed drop in hemoglobin and need for blood transfusion today.  No other specific questions or concerns at this time.  Denies headache, no dizziness, no chest pain, no palpitations, no shortness of breath, no abdominal pain, no fever/chills/night sweats, no nausea/vomiting/diarrhea, no focal weakness, no cough/congestion, no fatigue, no paresthesia.  No acute events overnight per nursing staff.  Objective: Vitals:   03/19/22 0604 03/19/22 0721 03/19/22 0745 03/19/22 0915  BP: 117/68 (!) 155/55 132/65 (!) 140/41  Pulse: 66 64 64 62  Resp:  '18 18 18  '$ Temp: 99 F (37.2 C) 98 F (36.7 C) 98.3 F (36.8 C) 97.6 F (36.4 C)  TempSrc:  Oral Oral Oral  SpO2: 97% 98% 99% 100%  Weight:      Height:        Intake/Output Summary (Last 24 hours) at 03/19/2022 0959 Last data filed at 03/19/2022 2836 Gross per 24 hour   Intake 740 ml  Output 300 ml  Net 440 ml   Filed Weights   03/18/22 0121 03/18/22 1258  Weight: 56 kg 57.8 kg    Examination:  Physical Exam: GEN: NAD, alert and oriented x 3, elderly in appearance HEENT: NCAT, PERRL, EOMI, sclera clear, MMM PULM: CTAB w/o wheezes/crackles, normal respiratory effort, on room air CV: RRR w/o M/G/R GI: abd soft, NTND, NABS, no R/G/M MSK: no peripheral edema, moves all extremities independently, sensation to light touch intact, neurovascular intact, surgical site noted with dressings in place, clean/dry/intact PSYCH: normal mood/affect Integumentary: dry/intact, no rashes or wounds    Data Reviewed: I have personally reviewed following labs and imaging studies  CBC: Recent Labs  Lab 03/18/22 0040 03/19/22 0057  WBC 11.1* 6.2  NEUTROABS 8.7*  --   HGB 10.4* 7.1*  HCT 30.1* 20.8*  MCV 95.6 95.9  PLT 155 629*   Basic Metabolic Panel: Recent Labs  Lab 03/18/22 0040 03/19/22 0057  NA 139 132*  K 4.3 4.2  CL 104 103  CO2 24 19*  GLUCOSE 68* 310*  BUN 58* 63*  CREATININE 2.43* 2.84*  CALCIUM 9.0 8.0*   GFR: Estimated Creatinine Clearance: 12.2 mL/min (A) (by C-G formula based on SCr of 2.84 mg/dL (H)). Liver Function Tests: No results for input(s): AST, ALT, ALKPHOS, BILITOT, PROT, ALBUMIN in the last 168 hours. No results for input(s): LIPASE, AMYLASE in the last 168 hours. No results for input(s): AMMONIA in the last 168 hours. Coagulation Profile: No results for input(s): INR, PROTIME in the last 168 hours. Cardiac Enzymes: No results for input(s): CKTOTAL, CKMB, CKMBINDEX, TROPONINI in the last 168 hours. BNP (  last 3 results) No results for input(s): PROBNP in the last 8760 hours. HbA1C: No results for input(s): HGBA1C in the last 72 hours. CBG: Recent Labs  Lab 03/18/22 1831 03/18/22 2032 03/18/22 2333 03/19/22 0423 03/19/22 0819  GLUCAP 156* 213* 343* 257* 256*   Lipid Profile: No results for input(s): CHOL,  HDL, LDLCALC, TRIG, CHOLHDL, LDLDIRECT in the last 72 hours. Thyroid Function Tests: No results for input(s): TSH, T4TOTAL, FREET4, T3FREE, THYROIDAB in the last 72 hours. Anemia Panel: No results for input(s): VITAMINB12, FOLATE, FERRITIN, TIBC, IRON, RETICCTPCT in the last 72 hours. Sepsis Labs: No results for input(s): PROCALCITON, LATICACIDVEN in the last 168 hours.  Recent Results (from the past 240 hour(s))  Surgical pcr screen     Status: None   Collection Time: 03/18/22  1:14 PM   Specimen: Nasal Mucosa; Nasal Swab  Result Value Ref Range Status   MRSA, PCR NEGATIVE NEGATIVE Final   Staphylococcus aureus NEGATIVE NEGATIVE Final    Comment: (NOTE) The Xpert SA Assay (FDA approved for NASAL specimens in patients 70 years of age and older), is one component of a comprehensive surveillance program. It is not intended to diagnose infection nor to guide or monitor treatment. Performed at [facility], [place] [address] [place], [place] [address]          Radiology Studies: CT Head Wo Contrast  Result Date: 03/18/2022 CLINICAL DATA:  Polytrauma, blunt.  Fall, head injury EXAM: CT HEAD WITHOUT CONTRAST CT CERVICAL SPINE WITHOUT CONTRAST TECHNIQUE: Multidetector CT imaging of the head and cervical spine was performed following the standard protocol without intravenous contrast. Multiplanar CT image reconstructions of the cervical spine were also generated. RADIATION DOSE REDUCTION: This exam was performed according to the departmental dose-optimization program which includes automated exposure control, adjustment of the mA and/or kV according to patient size and/or use of iterative reconstruction technique. COMPARISON:  None Available. FINDINGS: CT HEAD FINDINGS Brain: Normal anatomic configuration. Parenchymal volume loss is commensurate with the patient's age. Mild periventricular white matter changes are present likely reflecting the sequela of small vessel ischemia. No abnormal  intra or extra-axial mass lesion or fluid collection. No abnormal mass effect or midline shift. No evidence of acute intracranial hemorrhage or infarct. Ventricular size is normal. Cerebellum unremarkable. Vascular: No asymmetric hyperdense vasculature at the skull base. Skull: Intact Sinuses/Orbits: Paranasal sinuses are clear. Orbits are unremarkable. Other: Mastoid air cells and middle ear cavities are clear. CT CERVICAL SPINE FINDINGS Alignment: Normal. Skull base and vertebrae: Craniocervical alignment is normal. Atlantodental interval is not widened. No acute fracture of the cervical spine. Vertebral body height is preserved. Soft tissues and spinal canal: No canal hematoma. No prevertebral soft tissue swelling or paravertebral fluid collection. Extensive atherosclerotic calcification within the carotid bifurcations. No pathologic cervical adenopathy. Disc levels: Intervertebral disc space narrowing and endplate remodeling at Z1-6 in keeping with changes of advanced degenerative disc disease. Mild degenerative changes are seen throughout the remainder of the cervical spine. Prevertebral soft tissues are not thickened on sagittal reformats. The spinal canal is widely patent. No significant neuroforaminal narrowing. Upper chest: Unremarkable Other: None IMPRESSION: No acute intracranial injury.  No calvarial fracture. No acute fracture or listhesis of the cervical spine. Peripheral vascular disease with extensive atherosclerotic calcification within the carotid bifurcations. Electronically Signed   By: Fidela Salisbury M.D.   On: 03/18/2022 01:59   CT Cervical Spine Wo Contrast  Result Date: 03/18/2022 CLINICAL DATA:  Polytrauma, blunt.  Fall, head injury EXAM:  CT HEAD WITHOUT CONTRAST CT CERVICAL SPINE WITHOUT CONTRAST TECHNIQUE: Multidetector CT imaging of the head and cervical spine was performed following the standard protocol without intravenous contrast. Multiplanar CT image reconstructions of the  cervical spine were also generated. RADIATION DOSE REDUCTION: This exam was performed according to the departmental dose-optimization program which includes automated exposure control, adjustment of the mA and/or kV according to patient size and/or use of iterative reconstruction technique. COMPARISON:  None Available. FINDINGS: CT HEAD FINDINGS Brain: Normal anatomic configuration. Parenchymal volume loss is commensurate with the patient's age. Mild periventricular white matter changes are present likely reflecting the sequela of small vessel ischemia. No abnormal intra or extra-axial mass lesion or fluid collection. No abnormal mass effect or midline shift. No evidence of acute intracranial hemorrhage or infarct. Ventricular size is normal. Cerebellum unremarkable. Vascular: No asymmetric hyperdense vasculature at the skull base. Skull: Intact Sinuses/Orbits: Paranasal sinuses are clear. Orbits are unremarkable. Other: Mastoid air cells and middle ear cavities are clear. CT CERVICAL SPINE FINDINGS Alignment: Normal. Skull base and vertebrae: Craniocervical alignment is normal. Atlantodental interval is not widened. No acute fracture of the cervical spine. Vertebral body height is preserved. Soft tissues and spinal canal: No canal hematoma. No prevertebral soft tissue swelling or paravertebral fluid collection. Extensive atherosclerotic calcification within the carotid bifurcations. No pathologic cervical adenopathy. Disc levels: Intervertebral disc space narrowing and endplate remodeling at Z6-0 in keeping with changes of advanced degenerative disc disease. Mild degenerative changes are seen throughout the remainder of the cervical spine. Prevertebral soft tissues are not thickened on sagittal reformats. The spinal canal is widely patent. No significant neuroforaminal narrowing. Upper chest: Unremarkable Other: None IMPRESSION: No acute intracranial injury.  No calvarial fracture. No acute fracture or listhesis of  the cervical spine. Peripheral vascular disease with extensive atherosclerotic calcification within the carotid bifurcations. Electronically Signed   By: Fidela Salisbury M.D.   On: 03/18/2022 01:59   DG Humerus Left  Result Date: 03/18/2022 CLINICAL DATA:  Fall with deformity. EXAM: LEFT HUMERUS - 2+ VIEW COMPARISON:  11/23/2016, 02/10/2022. FINDINGS: There is deformity of the left humeral head/neck which is improved from the prior exam and likely related to old trauma. No acute fracture or dislocation is seen. Degenerative changes are present at the acromioclavicular joint. IMPRESSION: Bony deformity of the left humeral head/neck, unchanged from the previous exam and likely related to old trauma. No acute fracture is identified. Electronically Signed   By: Brett Fairy M.D.   On: 03/18/2022 00:43   DG Hip Unilat With Pelvis 2-3 Views Left  Result Date: 03/18/2022 CLINICAL DATA:  Fall, with deformity. EXAM: LEFT FEMUR 1 VIEW; DG HIP (WITH OR WITHOUT PELVIS) 2-3V LEFT COMPARISON:  05/17/2019. FINDINGS: There is a comminuted intertrochanteric fracture of the left hip with mild lateral angulation and superior subluxation of the distal fracture fragment. No dislocation is seen. The remaining bony structures appear intact. Total knee arthroplasty changes are noted on the left without evidence of hardware loosening. Mild degenerative changes are noted in the lower lumbar spine. Vascular calcifications are noted in the pelvis and lower extremities bilaterally. IMPRESSION: Comminuted angulated intratrochanteric fracture of the proximal left femur. Electronically Signed   By: Brett Fairy M.D.   On: 03/18/2022 00:39   DG Femur 1V Left  Result Date: 03/18/2022 CLINICAL DATA:  Fall, with deformity. EXAM: LEFT FEMUR 1 VIEW; DG HIP (WITH OR WITHOUT PELVIS) 2-3V LEFT COMPARISON:  05/17/2019. FINDINGS: There is a comminuted intertrochanteric fracture of the left hip  with mild lateral angulation and superior  subluxation of the distal fracture fragment. No dislocation is seen. The remaining bony structures appear intact. Total knee arthroplasty changes are noted on the left without evidence of hardware loosening. Mild degenerative changes are noted in the lower lumbar spine. Vascular calcifications are noted in the pelvis and lower extremities bilaterally. IMPRESSION: Comminuted angulated intratrochanteric fracture of the proximal left femur. Electronically Signed   By: Brett Fairy M.D.   On: 03/18/2022 00:39   DG FEMUR MIN 2 VIEWS LEFT  Result Date: 03/18/2022 CLINICAL DATA:  Postop. EXAM: LEFT FEMUR 2 VIEWS COMPARISON:  03/18/2022. FINDINGS: There is redemonstration of a comminuted intertrochanteric fracture of the proximal left femur with interval placement gamma nail and intramedullary rod. Fracture fragment alignment is improved. Small amount of air is noted in the soft tissues of the left hip. Vascular calcifications are noted in the left lower extremity. IMPRESSION: Status post open reduction internal fixation of intertrochanteric fracture of the proximal left femur. Electronically Signed   By: Brett Fairy M.D.   On: 03/18/2022 21:24   DG FEMUR MIN 2 VIEWS LEFT  Result Date: 03/18/2022 CLINICAL DATA:  Left femur ORIF. EXAM: LEFT FEMUR 2 VIEWS COMPARISON:  Left hip radiographs 03/18/2022 FLUOROSCOPY: Fluoroscopy Time: 59 seconds Radiation Exposure Index: 16.24 mGy FINDINGS: Six intraoperative spot fluoroscopic images are provided during ORIF of the previously described inter trochanteric fracture of the left femur. An intramedullary nail and proximal and distal screws have been placed with improved alignment. A displaced lesser trochanter fragment is again noted. IMPRESSION: Intraoperative images during ORIF of left femur fracture. Electronically Signed   By: Logan Bores M.D.   On: 03/18/2022 17:57        Scheduled Meds:  sodium chloride   Intravenous Once   acetaminophen  500 mg Oral Q6H    amLODipine  5 mg Oral Daily   aspirin EC  81 mg Oral Daily   carvedilol  25 mg Oral BID   docusate sodium  100 mg Oral BID   enoxaparin (LOVENOX) injection  30 mg Subcutaneous Q24H   gabapentin  300 mg Oral QHS   insulin aspart  0-15 Units Subcutaneous TID WC   insulin glargine-yfgn  20 Units Subcutaneous QHS   isosorbide mononitrate  30 mg Oral Daily   levothyroxine  100 mcg Oral QHS   pantoprazole  40 mg Oral QAC breakfast   rosuvastatin  10 mg Oral QHS   sertraline  25 mg Oral Daily   Continuous Infusions:     LOS: 1 day    Time spent: 49 minutes spent on chart review, discussion with nursing staff, consultants, updating family and interview/physical exam; more than 50% of that time was spent in counseling and/or coordination of care.    Lalia Loudon J [place] DO [facility] Available via Epic secure chat 7am-7pm After these hours, please refer to coverage provider listed on [email or web address] 03/19/2022, 9:59 AM

## 2022-03-20 DIAGNOSIS — N184 Chronic kidney disease, stage 4 (severe): Secondary | ICD-10-CM | POA: Diagnosis not present

## 2022-03-20 DIAGNOSIS — E440 Moderate protein-calorie malnutrition: Secondary | ICD-10-CM

## 2022-03-20 DIAGNOSIS — S72142A Displaced intertrochanteric fracture of left femur, initial encounter for closed fracture: Secondary | ICD-10-CM | POA: Diagnosis not present

## 2022-03-20 DIAGNOSIS — E871 Hypo-osmolality and hyponatremia: Secondary | ICD-10-CM | POA: Diagnosis not present

## 2022-03-20 DIAGNOSIS — Z950 Presence of cardiac pacemaker: Secondary | ICD-10-CM | POA: Diagnosis not present

## 2022-03-20 DIAGNOSIS — I5032 Chronic diastolic (congestive) heart failure: Secondary | ICD-10-CM | POA: Diagnosis not present

## 2022-03-20 LAB — CBC
HCT: 21.6 % — ABNORMAL LOW (ref 36.0–46.0)
Hemoglobin: 7.6 g/dL — ABNORMAL LOW (ref 12.0–15.0)
MCH: 32.1 pg (ref 26.0–34.0)
MCHC: 35.2 g/dL (ref 30.0–36.0)
MCV: 91.1 fL (ref 80.0–100.0)
Platelets: 97 10*3/uL — ABNORMAL LOW (ref 150–400)
RBC: 2.37 MIL/uL — ABNORMAL LOW (ref 3.87–5.11)
RDW: 16.1 % — ABNORMAL HIGH (ref 11.5–15.5)
WBC: 11.2 10*3/uL — ABNORMAL HIGH (ref 4.0–10.5)
nRBC: 0 % (ref 0.0–0.2)

## 2022-03-20 LAB — GLUCOSE, CAPILLARY
Glucose-Capillary: 125 mg/dL — ABNORMAL HIGH (ref 70–99)
Glucose-Capillary: 135 mg/dL — ABNORMAL HIGH (ref 70–99)
Glucose-Capillary: 150 mg/dL — ABNORMAL HIGH (ref 70–99)
Glucose-Capillary: 157 mg/dL — ABNORMAL HIGH (ref 70–99)
Glucose-Capillary: 163 mg/dL — ABNORMAL HIGH (ref 70–99)
Glucose-Capillary: 184 mg/dL — ABNORMAL HIGH (ref 70–99)

## 2022-03-20 LAB — BASIC METABOLIC PANEL
Anion gap: 10 (ref 5–15)
BUN: 88 mg/dL — ABNORMAL HIGH (ref 8–23)
CO2: 22 mmol/L (ref 22–32)
Calcium: 7.7 mg/dL — ABNORMAL LOW (ref 8.9–10.3)
Chloride: 99 mmol/L (ref 98–111)
Creatinine, Ser: 3.39 mg/dL — ABNORMAL HIGH (ref 0.44–1.00)
GFR, Estimated: 13 mL/min — ABNORMAL LOW (ref >60)
Glucose, Bld: 149 mg/dL — ABNORMAL HIGH (ref 70–99)
Potassium: 4.5 mmol/L (ref 3.5–5.1)
Sodium: 131 mmol/L — ABNORMAL LOW (ref 135–145)

## 2022-03-20 LAB — HEMOGLOBIN AND HEMATOCRIT, BLOOD
HCT: 24.3 % — ABNORMAL LOW (ref 36.0–46.0)
Hemoglobin: 8.4 g/dL — ABNORMAL LOW (ref 12.0–15.0)

## 2022-03-20 MED ORDER — FERROUS SULFATE 325 (65 FE) MG PO TABS
325.0000 mg | ORAL_TABLET | Freq: Two times a day (BID) | ORAL | Status: DC
  Administered 2022-03-20 – 2022-03-23 (×6): 325 mg via ORAL
  Filled 2022-03-20 (×6): qty 1

## 2022-03-20 MED ORDER — SODIUM CHLORIDE 0.9 % IV SOLN: INTRAVENOUS | Status: AC

## 2022-03-20 NOTE — Progress Notes (Signed)
Subjective: 2 Days Post-Op Procedure(s) (LRB): INTRAMEDULLARY (IM) NAIL INTERTROCHANTRIC (Left) Patient reports pain as mild.  Denies dizziness or shortness of breath.  Was out of bed yesterday to chair.  Patient has been to [facility] in [place] before and would like to go there if possible.  Objective: Vital signs in last 24 hours: Temp:  [97.9 F (36.6 C)-98.7 F (37.1 C)] 98.3 F (36.8 C) (05/27 1009) Pulse Rate:  [60-65] 65 (05/27 1009) Resp:  [16-17] 16 (05/27 1009) BP: (126-133)/(40-53) 132/45 (05/27 1009) SpO2:  [93 %-100 %] 100 % (05/27 1009)  Intake/Output from previous day: 05/26 0701 - 05/27 0700 In: 1167 [P.O.:720; I.V.:130; Blood:317] Out: 175 [Urine:175] Intake/Output this shift: Total I/O In: 240 [P.O.:240] Out: -   Recent Labs    03/18/22 0040 03/19/22 0057 03/19/22 1235 03/20/22 0205  HGB 10.4* 7.1* 9.5* 7.6*   Recent Labs    03/19/22 0057 03/19/22 1235 03/20/22 0205  WBC 6.2  --  11.2*  RBC 2.17*  --  2.37*  HCT 20.8* 26.8* 21.6*  PLT 106*  --  97*   Recent Labs    03/19/22 0057 03/20/22 0205  NA 132* 131*  K 4.2 4.5  CL 103 99  CO2 19* 22  BUN 63* 88*  CREATININE 2.84* 3.39*  GLUCOSE 310* 149*  CALCIUM 8.0* 7.7*   No results for input(s): LABPT, INR in the last 72 hours. Left hip exam: Neurovascular intact Dorsiflexion/Plantar flexion intact Incision: dressing C/D/I No cellulitis present Compartment soft   Assessment/Plan: 2 Days Post-Op Procedure(s) (LRB): INTRAMEDULLARY (IM) NAIL INTERTROCHANTRIC (Left) Postop blood loss anemia.  Asymptomatic Plan: Weight-bear as tolerated on left lower extremity. Up with therapy Check CBC in a.m.  We will start oral iron supplementation. We will follow.  Will need SNF.  Patient interested in [facility] facility.   Erlene Senters 03/20/2022, 12:36 PM

## 2022-03-20 NOTE — Progress Notes (Signed)
Physical Therapy Treatment Patient Details Name: [name] MRN: [id number] DOB: [birth year] Today's Date: 03/20/2022   History of Present Illness 81 y.o. female  who presents after mechanical fall on 03/17/22. L IM nail completed on 03/18/22. PMH significant of HOH, CKD, paroxsymal atrial fibrillation, SSS s/p pacemaker, CAD s/p CABG x3, insulin-dependent K4MW, chronic diastolic HF, HTN, and HLD    PT Comments    Pt continues to demo difficulty with sit > stand, requiring max A with use of RW today.  Once upright, able to amb short distance in room with mod A for safety.  Pt. Demos dec activity tolerance limiting gait distance.  Good tolerance to initiation of therex noted.  Discussion with daughter re: AIR vs SNF as MD note from today indicates SNF, but daughter expresses desire to continue to pursue AIR and only transition to SNF if AIR refuses.  Recommendations for follow up therapy are one component of a multi-disciplinary discharge planning process, led by the attending physician.  Recommendations may be updated based on patient status, additional functional criteria and insurance authorization.  Follow Up Recommendations  Acute inpatient rehab (3hours/day) (Family would like to pursue SNF only if AIR declines)     Assistance Recommended at Discharge Frequent or constant Supervision/Assistance  Patient can return home with the following A lot of help with walking and/or transfers;A lot of help with bathing/dressing/bathroom;Help with stairs or ramp for entrance;Assist for transportation;Assistance with cooking/housework   Equipment Recommendations  Rolling walker (2 wheels)    Recommendations for Other Services       Precautions / Restrictions Precautions Precautions: Fall Restrictions Weight Bearing Restrictions: Yes     Mobility  Bed Mobility Overal bed mobility: Needs Assistance Bed Mobility: Supine to Sit     Supine to sit: Min assist     General bed mobility  comments: Pt. req's min A for L LE negotiation and HH support to transfer to sitting.  Able to scoot partway to EOB without assist.  Req's use of chuck pad to scoot the remainder of the way forward.    Transfers Overall transfer level: Needs assistance Equipment used: Rolling walker (2 wheels) Transfers: Sit to/from Stand Sit to Stand: Max assist           General transfer comment: Pt. req's 2 attempts to complete sit > stand transfer with use of RW.  1st attempt, pt demos poor L foot placement and is unable to obtain upright position.  2nd attemp, L foot is positioned for pt and she is able to transition to standing with max A.  Pt unable to push up from bed and instead reaches for RW.  Pt demos fair standing balance once upright.    Ambulation/Gait Ambulation/Gait assistance: Mod assist Gait Distance (Feet): 15 Feet Assistive device: Rolling walker (2 wheels) Gait Pattern/deviations: Step-to pattern       General Gait Details: Pt amb short distance in room with mod A for safety and VC's for appropriate gait sequencing.  Pt demos difficulty with carryover and often leads with R foot instead but does not c/o any inc pain with activity.  Pt demos fair balance and fatigues quickly.   Stairs             Wheelchair Mobility    Modified Rankin (Stroke Patients Only)       Balance Overall balance assessment: Needs assistance         Standing balance support: Bilateral upper extremity supported, During functional activity Standing balance-Leahy Scale:  Fair                              Cognition Arousal/Alertness: Awake/alert Behavior During Therapy: WFL for tasks assessed/performed Overall Cognitive Status: Within Functional Limits for tasks assessed                                 General Comments: Pt is supine in bed when PT arrives.  Daughter present in room.  Agreeable to work with PT on getting OOB.  Daughter states she prefers AIR but  if not approved would like to pursue SNF.        Exercises General Exercises - Lower Extremity Ankle Circles/Pumps: AROM, Both, 20 reps Heel Slides: AAROM, Left, 20 reps Hip ABduction/ADduction: AAROM, Left, 20 reps Straight Leg Raises: AAROM, Left, 20 reps    General Comments        Pertinent Vitals/Pain Pain Assessment Pain Assessment: Faces Faces Pain Scale: Hurts little more Pain Location: L hip Pain Descriptors / Indicators: Aching, Throbbing, Discomfort Pain Intervention(s): Limited activity within patient's tolerance, Repositioned, Monitored during session, Premedicated before session    Home Living                          Prior Function            PT Goals (current goals can now be found in the care plan section) Progress towards PT goals: Progressing toward goals    Frequency    Min 3X/week      PT Plan Current plan remains appropriate    Co-evaluation              AM-PAC PT "6 Clicks" Mobility   Outcome Measure  Help needed turning from your back to your side while in a flat bed without using bedrails?: A Little Help needed moving from lying on your back to sitting on the side of a flat bed without using bedrails?: A Little Help needed moving to and from a bed to a chair (including a wheelchair)?: A Lot Help needed standing up from a chair using your arms (e.g., wheelchair or bedside chair)?: A Lot Help needed to walk in hospital room?: A Lot Help needed climbing 3-5 steps with a railing? : A Lot 6 Click Score: 14    End of Session Equipment Utilized During Treatment: Gait belt Activity Tolerance: Patient tolerated treatment well Patient left: in chair;with call bell/phone within reach;with chair alarm set;with family/visitor present Nurse Communication: Mobility status       Time: 6468-0321 PT Time Calculation (min) (ACUTE ONLY): 34 min  Charges:  $Gait Training: 8-22 mins $Therapeutic Exercise: 8-22 mins                      Cortni Tays A. Karin Pinedo, PT, DPT Acute Rehabilitation Services Office: [place] 03/20/2022, 2:07 PM

## 2022-03-20 NOTE — Plan of Care (Signed)
  Problem: Education: Goal: Knowledge of General Education information will improve Description: Including pain rating scale, medication(s)/side effects and non-pharmacologic comfort measures Outcome: Progressing   Problem: Activity: Goal: Risk for activity intolerance will decrease Outcome: Progressing   Problem: Nutrition: Goal: Adequate nutrition will be maintained Outcome: Progressing   

## 2022-03-20 NOTE — Progress Notes (Addendum)
PROGRESS NOTE    [name]  ZOX:[id number] DOB: 12/18/1940 DOA: 03/17/2022 PCP: Leeroy Cha, MD    Brief Narrative:   [name] is a 81 y.o. female with past medical history significant for paroxysmal atrial fibrillation, SSS s/p PPM, CAD s/p CABG x3, type 2 diabetes mellitus, hypothyroidism, hyperlipidemia, chronic diastolic congestive heart failure, essential hypertension, CKD stage IV hyperlipidemia history of GI bleed who presented to [facility] ED on 5/24 following mechanical fall at home.  Patient states was getting ready for bed, dropped an item on the ground and when she tried to pick it up she fell onto her right side.  She reported immediate pain to her left hip and was brought to the ED via EMS for further evaluation.  In the ED, temperature 97.9 degrees Fahrenheit, HR 60, RR 16, BP 168/63, SPO2 100% on room air.  Sodium 139, potassium 4.3, chloride 104, CO2 24, glucose 68, BUN 58, creatinine 2.43.  WBC 11.1, hemoglobin 10.4, platelets 155.  Urinalysis unrevealing.  CT head and C-spine with no acute intracranial injury, no calvarial fracture, no acute fracture or listhesis of the C-spine.  Pelvis/left femur with comminuted angulated intratrochanteric fracture of the proximal left femur.  Left humerus x-ray with bony deformity of the left humeral head/neck, unchanged from previous exam and likely related to old trauma, no acute fractures identified.  Orthopedics was consulted.  Hospital service consulted for further evaluation and management for acute left hip fracture.  Assessment & Plan:   Left hip fracture Patient presenting to ED via EMS following mechanical fall at home without loss of consciousness.  Imaging left pelvis/hip with comminuted angulated intratrochanteric fracture of the proximal left femur.  Orthopedics was consulted and patient underwent left intratrochanteric intramedullary nail by Dr. Mable Fill on 03/19/2022. --Weightbearing as tolerates left lower  extremity --Norco 5-325 mg q4h PRN moderate pain --Dilaudid 0.5 mg IV q2h PRN severe pain --DVT prophylaxis with Lovenox x6 weeks postoperatively per orthopedics -- Continue PT/OT efforts while inpatient, pending SNF placement --Outpatient follow-up with [facility] 2 weeks postop  Acute postoperative blood loss anemia --Hgb 10.4>7.1>9.5>7.6 --Transfused 1 unit PRBC 5/26 --repeat H/H 1400; if further trending down will transfuse additional unit PRBC --CBC in a.m.  [name] on CKD stage IV Follows with [facility], Dr. Moshe Cipro outpatient. Creatinine baseline 2.2-2.4 --Cr 2.43>2.84>3.39 --Avoid nephrotoxins, renally dose medications --Holding home torsemide --NS at 75 mL/h per hour --Strict I's and O's --Repeat BMP in a.m.  Hyponatremia Etiology likely secondary to hypovolemic hyponatremia in the setting of poor oral intake/dehydration. --Na 139>132>131 --NS at 75 mL/h --BMP daily  Chronic diastolic congestive heart failure, compensated Follows with cardiology, Dr. Debara Pickett outpatient.  TTE 01/06/2022 LVEF 55-60%, LV normal function, LV has no regional wall motion normalities, mild LVH, grade 2 diastolic dysfunction, LA mildly dilated, moderate TR, mild MR, no aortic stenosis, IVC normal in size. --Carvedilol 25 mg p.o. twice daily --Imdur 30 mg p.o. daily --Holding home torsemide 40 mg p.o. daily in the setting of increased creatinine --Strict I's and O's and daily weights  SSS s/p PPM Followed by electrophysiology, Dr. Curt Bears outpatient.  Paroxysmal atrial flutter/fibrillation No longer on anticoagulation due to prior history of GI bleed.  Type 2 diabetes mellitus, with hyperglycemia Home regimen includes Lantus 20 units subcutaneously daily --Semglee 25 units obediently daily --SSI for coverage --CBGs qAC/HS  Peripheral neuropathy --Gabapentin 300 mg PO qHS  CAD Prior history of three-vessel CABG 2013. --Continue aspirin, statin,  beta-blocker, Imdur  Essential hypertension  Home medication regimen includes carvedilol 25 mg p.o. daily, amlodipine 5 mg p.o. daily, isosorbide mononitrate 30 mg p.o. daily, torsemide 40 mg p.o. daily. --Continue carvedilol, amlodipine, isosorbide mononitrate --Holding home torsemide as above --Hydralazine '10mg'$  IV q6h PRN SBP >165 or DBP >110  Hyperlipidemia --Crestor 10 mg p.o. daily  Hypothyroidism TSH 0.135 and free T for 1.50 on 02/11/2022. --Levothyroxine 100 mcg p.o. daily  Depression/anxiety --Sertraline 25 mg p.o. daily  DVT prophylaxis: enoxaparin (LOVENOX) injection 30 mg Start: 03/19/22 1000 SCDs Start: 03/18/22 2031 SCDs Start: 03/18/22 1435 SCDs Start: 03/18/22 1009    Code Status: DNR Family Communication: Family present at bedside this morning  Disposition Plan:  Level of care: Med-Surg Status is: Inpatient Remains inpatient appropriate because: Pending SNF placement    Consultants:  Orthopedics, Dr. Mable Fill  Procedures:  Left intertrochanteric intramedullary nail, Dr. Mable Fill 5/25  Antimicrobials:  Perioperative cefazolin   Subjective: Patient seen examined bedside, resting comfortably in bed.  Reports mild soreness to left leg, otherwise no other complaints.  Does report decreased oral intake, "not too hungry".  Discussed may need additional blood transfusion for downtrending hemoglobin this morning, but will repeat hemoglobin again at 1400 this afternoon.  No family present at bedside.  No other specific complaints or concerns at this time. Denies headache, no dizziness, no chest pain, no palpitations, no shortness of breath, no abdominal pain, no fever/chills/night sweats, no nausea/vomiting/diarrhea, no focal weakness, no cough/congestion, no fatigue, no paresthesia.  No acute events overnight per nursing staff.  Objective: Vitals:   03/19/22 2005 03/19/22 2359 03/20/22 0413 03/20/22 1009  BP: (!) 133/50 (!) 128/40 (!) 126/46 (!) 132/45  Pulse: 60 60  63 65  Resp: '17 16 16 16  '$ Temp: 97.9 F (36.6 C) 98.7 F (37.1 C) 98.5 F (36.9 C) 98.3 F (36.8 C)  TempSrc: Oral Oral Oral   SpO2: 97% 96% 93% 100%  Weight:      Height:        Intake/Output Summary (Last 24 hours) at 03/20/2022 1052 Last data filed at 03/19/2022 1900 Gross per 24 hour  Intake 927 ml  Output 175 ml  Net 752 ml   Filed Weights   03/18/22 0121 03/18/22 1258  Weight: 56 kg 57.8 kg    Examination:  Physical Exam: GEN: NAD, alert and oriented x 3, elderly in appearance HEENT: NCAT, PERRL, EOMI, sclera clear, MMM PULM: CTAB w/o wheezes/crackles, normal respiratory effort, on room air CV: RRR w/o M/G/R GI: abd soft, NTND, NABS, no R/G/M MSK: no peripheral edema, moves all extremities independently, sensation to light touch intact, neurovascularlly intact, surgical site noted with dressings in place, clean/dry/intact PSYCH: normal mood/affect Integumentary: dry/intact, no rashes or wounds    Data Reviewed: I have personally reviewed following labs and imaging studies  CBC: Recent Labs  Lab 03/18/22 0040 03/19/22 0057 03/19/22 1235 03/20/22 0205  WBC 11.1* 6.2  --  11.2*  NEUTROABS 8.7*  --   --   --   HGB 10.4* 7.1* 9.5* 7.6*  HCT 30.1* 20.8* 26.8* 21.6*  MCV 95.6 95.9  --  91.1  PLT 155 106*  --  97*   Basic Metabolic Panel: Recent Labs  Lab 03/18/22 0040 03/19/22 0057 03/20/22 0205  NA 139 132* 131*  K 4.3 4.2 4.5  CL 104 103 99  CO2 24 19* 22  GLUCOSE 68* 310* 149*  BUN 58* 63* 88*  CREATININE 2.43* 2.84* 3.39*  CALCIUM 9.0 8.0* 7.7*   GFR: Estimated Creatinine Clearance:  10.2 mL/min (A) (by C-G formula based on SCr of 3.39 mg/dL (H)). Liver Function Tests: No results for input(s): AST, ALT, ALKPHOS, BILITOT, PROT, ALBUMIN in the last 168 hours. No results for input(s): LIPASE, AMYLASE in the last 168 hours. No results for input(s): AMMONIA in the last 168 hours. Coagulation Profile: No results for input(s): INR, PROTIME in the  last 168 hours. Cardiac Enzymes: No results for input(s): CKTOTAL, CKMB, CKMBINDEX, TROPONINI in the last 168 hours. BNP (last 3 results) No results for input(s): PROBNP in the last 8760 hours. HbA1C: No results for input(s): HGBA1C in the last 72 hours. CBG: Recent Labs  Lab 03/19/22 1750 03/19/22 2007 03/20/22 0003 03/20/22 0418 03/20/22 0737  GLUCAP 211* 177* 125* 157* 135*   Lipid Profile: No results for input(s): CHOL, HDL, LDLCALC, TRIG, CHOLHDL, LDLDIRECT in the last 72 hours. Thyroid Function Tests: No results for input(s): TSH, T4TOTAL, FREET4, T3FREE, THYROIDAB in the last 72 hours. Anemia Panel: No results for input(s): VITAMINB12, FOLATE, FERRITIN, TIBC, IRON, RETICCTPCT in the last 72 hours. Sepsis Labs: No results for input(s): PROCALCITON, LATICACIDVEN in the last 168 hours.  Recent Results (from the past 240 hour(s))  Surgical pcr screen     Status: None   Collection Time: 03/18/22  1:14 PM   Specimen: Nasal Mucosa; Nasal Swab  Result Value Ref Range Status   MRSA, PCR NEGATIVE NEGATIVE Final   Staphylococcus aureus NEGATIVE NEGATIVE Final    Comment: (NOTE) The Xpert SA Assay (FDA approved for NASAL specimens in patients 45 years of age and older), is one component of a comprehensive surveillance program. It is not intended to diagnose infection nor to guide or monitor treatment. Performed at [facility], [place] [address] [place], [place] [address]          Radiology Studies: DG FEMUR MIN 2 VIEWS LEFT  Result Date: 03/18/2022 CLINICAL DATA:  Postop. EXAM: LEFT FEMUR 2 VIEWS COMPARISON:  03/18/2022. FINDINGS: There is redemonstration of a comminuted intertrochanteric fracture of the proximal left femur with interval placement gamma nail and intramedullary rod. Fracture fragment alignment is improved. Small amount of air is noted in the soft tissues of the left hip. Vascular calcifications are noted in the left lower extremity. IMPRESSION: Status  post open reduction internal fixation of intertrochanteric fracture of the proximal left femur. Electronically Signed   By: Brett Fairy M.D.   On: 03/18/2022 21:24   DG FEMUR MIN 2 VIEWS LEFT  Result Date: 03/18/2022 CLINICAL DATA:  Left femur ORIF. EXAM: LEFT FEMUR 2 VIEWS COMPARISON:  Left hip radiographs 03/18/2022 FLUOROSCOPY: Fluoroscopy Time: 59 seconds Radiation Exposure Index: 16.24 mGy FINDINGS: Six intraoperative spot fluoroscopic images are provided during ORIF of the previously described inter trochanteric fracture of the left femur. An intramedullary nail and proximal and distal screws have been placed with improved alignment. A displaced lesser trochanter fragment is again noted. IMPRESSION: Intraoperative images during ORIF of left femur fracture. Electronically Signed   By: Logan Bores M.D.   On: 03/18/2022 17:57        Scheduled Meds:  amLODipine  5 mg Oral Daily   aspirin EC  81 mg Oral Daily   carvedilol  25 mg Oral BID   Chlorhexidine Gluconate Cloth  6 each Topical Daily   docusate sodium  100 mg Oral BID   enoxaparin (LOVENOX) injection  30 mg Subcutaneous Q24H   feeding supplement  1 Container Oral BID BM   gabapentin  300 mg Oral  QHS   insulin aspart  0-15 Units Subcutaneous TID WC   insulin glargine-yfgn  25 Units Subcutaneous QHS   isosorbide mononitrate  30 mg Oral Daily   levothyroxine  100 mcg Oral QHS   multivitamin with minerals  1 tablet Oral Daily   pantoprazole  40 mg Oral QAC breakfast   rosuvastatin  10 mg Oral QHS   sertraline  25 mg Oral Daily   Continuous Infusions:  sodium chloride 75 mL/hr at 03/20/22 0804      LOS: 2 days    Time spent: 49 minutes spent on chart review, discussion with nursing staff, consultants, updating family and interview/physical exam; more than 50% of that time was spent in counseling and/or coordination of care.    Hawthorne Day J [place] DO [facility] Available via Epic secure chat 7am-7pm After these  hours, please refer to coverage provider listed on [email or web address] 03/20/2022, 10:52 AM

## 2022-03-21 ENCOUNTER — Inpatient Hospital Stay (HOSPITAL_COMMUNITY): Payer: Medicare Other

## 2022-03-21 DIAGNOSIS — D62 Acute posthemorrhagic anemia: Secondary | ICD-10-CM

## 2022-03-21 DIAGNOSIS — N179 Acute kidney failure, unspecified: Secondary | ICD-10-CM | POA: Diagnosis not present

## 2022-03-21 DIAGNOSIS — N184 Chronic kidney disease, stage 4 (severe): Secondary | ICD-10-CM | POA: Diagnosis not present

## 2022-03-21 DIAGNOSIS — S72142A Displaced intertrochanteric fracture of left femur, initial encounter for closed fracture: Secondary | ICD-10-CM | POA: Diagnosis not present

## 2022-03-21 DIAGNOSIS — E871 Hypo-osmolality and hyponatremia: Secondary | ICD-10-CM

## 2022-03-21 LAB — HEMOGLOBIN AND HEMATOCRIT, BLOOD
HCT: 24.6 % — ABNORMAL LOW (ref 36.0–46.0)
Hemoglobin: 8.6 g/dL — ABNORMAL LOW (ref 12.0–15.0)

## 2022-03-21 LAB — FERRITIN: Ferritin: 271 ng/mL (ref 11–307)

## 2022-03-21 LAB — IRON AND TIBC
Iron: 31 ug/dL (ref 28–170)
Saturation Ratios: 17 % (ref 10.4–31.8)
TIBC: 183 ug/dL — ABNORMAL LOW (ref 250–450)
UIBC: 152 ug/dL

## 2022-03-21 LAB — GLUCOSE, CAPILLARY
Glucose-Capillary: 116 mg/dL — ABNORMAL HIGH (ref 70–99)
Glucose-Capillary: 117 mg/dL — ABNORMAL HIGH (ref 70–99)
Glucose-Capillary: 138 mg/dL — ABNORMAL HIGH (ref 70–99)
Glucose-Capillary: 144 mg/dL — ABNORMAL HIGH (ref 70–99)
Glucose-Capillary: 162 mg/dL — ABNORMAL HIGH (ref 70–99)

## 2022-03-21 LAB — BASIC METABOLIC PANEL
Anion gap: 8 (ref 5–15)
BUN: 95 mg/dL — ABNORMAL HIGH (ref 8–23)
CO2: 24 mmol/L (ref 22–32)
Calcium: 7.4 mg/dL — ABNORMAL LOW (ref 8.9–10.3)
Chloride: 103 mmol/L (ref 98–111)
Creatinine, Ser: 3.57 mg/dL — ABNORMAL HIGH (ref 0.44–1.00)
GFR, Estimated: 12 mL/min — ABNORMAL LOW (ref >60)
Glucose, Bld: 95 mg/dL (ref 70–99)
Potassium: 4.2 mmol/L (ref 3.5–5.1)
Sodium: 135 mmol/L (ref 135–145)

## 2022-03-21 LAB — CBC
HCT: 20.8 % — ABNORMAL LOW (ref 36.0–46.0)
Hemoglobin: 7 g/dL — ABNORMAL LOW (ref 12.0–15.0)
MCH: 31.7 pg (ref 26.0–34.0)
MCHC: 33.7 g/dL (ref 30.0–36.0)
MCV: 94.1 fL (ref 80.0–100.0)
Platelets: 89 10*3/uL — ABNORMAL LOW (ref 150–400)
RBC: 2.21 MIL/uL — ABNORMAL LOW (ref 3.87–5.11)
RDW: 15.1 % (ref 11.5–15.5)
WBC: 7 10*3/uL (ref 4.0–10.5)
nRBC: 0 % (ref 0.0–0.2)

## 2022-03-21 LAB — RETICULOCYTES
Immature Retic Fract: 13 % (ref 2.3–15.9)
RBC.: 2.2 MIL/uL — ABNORMAL LOW (ref 3.87–5.11)
Retic Count, Absolute: 39.2 10*3/uL (ref 19.0–186.0)
Retic Ct Pct: 1.8 % (ref 0.4–3.1)

## 2022-03-21 LAB — FOLATE: Folate: 10.6 ng/mL (ref >5.9)

## 2022-03-21 LAB — PREPARE RBC (CROSSMATCH)

## 2022-03-21 LAB — VITAMIN B12: Vitamin B-12: 261 pg/mL (ref 180–914)

## 2022-03-21 MED ORDER — SODIUM CHLORIDE 0.9 % IV SOLN: INTRAVENOUS | Status: AC

## 2022-03-21 MED ORDER — SODIUM CHLORIDE 0.9% IV SOLUTION: Freq: Once | INTRAVENOUS | Status: AC

## 2022-03-21 MED ORDER — POLYETHYLENE GLYCOL 3350 17 G PO PACK
17.0000 g | PACK | Freq: Every day | ORAL | Status: DC
  Administered 2022-03-21 – 2022-03-22 (×2): 17 g via ORAL
  Filled 2022-03-21 (×2): qty 1

## 2022-03-21 MED ORDER — SENNOSIDES-DOCUSATE SODIUM 8.6-50 MG PO TABS
2.0000 | ORAL_TABLET | Freq: Two times a day (BID) | ORAL | Status: DC
  Administered 2022-03-21 – 2022-03-23 (×5): 2 via ORAL
  Filled 2022-03-21 (×5): qty 2

## 2022-03-21 NOTE — Progress Notes (Signed)
Subjective: 3 Days Post-Op Procedure(s) (LRB): INTRAMEDULLARY (IM) NAIL INTERTROCHANTRIC (Left) Patient reports pain as mild.  1 unit of packed RBCs transfused earlier today.  She is without complaints.  Objective: Vital signs in last 24 hours: Temp:  [97.8 F (36.6 C)-98.8 F (37.1 C)] 98.8 F (37.1 C) (05/28 1220) Pulse Rate:  [59-64] 64 (05/28 1220) Resp:  [16-19] 16 (05/28 1220) BP: (102-155)/(41-49) 155/45 (05/28 1220) SpO2:  [95 %-99 %] 98 % (05/28 1220)  Intake/Output from previous day: 05/27 0701 - 05/28 0700 In: 1460.7 [P.O.:720; I.V.:740.7] Out: -  Intake/Output this shift: Total I/O In: 336 [Blood:336] Out: -   Recent Labs    03/19/22 0057 03/19/22 1235 03/20/22 0205 03/20/22 1404 03/21/22 0240  HGB 7.1* 9.5* 7.6* 8.4* 7.0*   Recent Labs    03/20/22 0205 03/20/22 1404 03/21/22 0240  WBC 11.2*  --  7.0  RBC 2.37*  --  2.21*  2.20*  HCT 21.6* 24.3* 20.8*  PLT 97*  --  89*   Recent Labs    03/20/22 0205 03/21/22 0240  NA 131* 135  K 4.5 4.2  CL 99 103  CO2 22 24  BUN 88* 95*  CREATININE 3.39* 3.57*  GLUCOSE 149* 95  CALCIUM 7.7* 7.4*   No results for input(s): LABPT, INR in the last 72 hours. Left hip exam: Neurovascular intact Sensation intact distally Intact pulses distally Dorsiflexion/Plantar flexion intact Incision: dressing C/D/I No cellulitis present Compartment soft   Assessment/Plan: 3 Days Post-Op Procedure(s) (LRB): INTRAMEDULLARY (IM) NAIL INTERTROCHANTRIC (Left) Postop blood loss anemia.  Had transfusion of 1 unit packed RBCs today. Plan: Up with therapy weight-bear as tolerated on left lower extremity. Continue subcu Lovenox daily for DVT prophylaxis.  Watch for signs of active bleeding secondary to this medication. Will need SNF.  Hopefully can be discharged in 1 to 2 days. Minimize narcotic pain meds.   [name] 03/21/2022, 1:05 PM

## 2022-03-21 NOTE — TOC Initial Note (Signed)
Transition of Care [facility]) - Initial/Assessment Note    Patient Details  Name: [name] MRN: [id number] Date of Birth: 07-12-1941  Transition of Care [facility]) CM/SW Contact:    Ina Homes, [place] Phone Number: 03/21/2022, 12:54 PM  Clinical Narrative:                  SW met with pt at bedside. Pt confirmed lived with grandson. Pt has access to SPC, quad cane, BSC, and s/c. Pt reports has hx with SNF, believes in was [facility]. SW discussed denial from CIR due to unlikelihood of insurance approval. Pt reports understanding. SW discussed SNF options. SW provided pt with list of SNF in pt area. Pt preferred [facility], [place]. Pt will review list further with daughter.    Expected Discharge Plan: Skilled Nursing Facility Barriers to Discharge: Ship broker, Continued Medical Work up, SNF Pending bed offer   Patient Goals and CMS Choice Patient states their goals for this hospitalization and ongoing recovery are:: return home CMS Medicare.gov Compare Post Acute Care list provided to:: Patient Choice offered to / list presented to : Patient  Expected Discharge Plan and Services Expected Discharge Plan: [place] Choice: [place] Living arrangements for the past 2 months: Single Family Home                                      Prior Living Arrangements/Services Living arrangements for the past 2 months: Single Family Home Lives with:: Other (Comment) (grandchild) Patient language and need for interpreter reviewed:: Yes Do you feel safe going back to the place where you live?: Yes          Current home services: DME, Other (comment) (r/w, BSC, SPC and quad cane; adjustable bed)    Activities of Daily Living Home Assistive Devices/Equipment: Walker (specify type) ADL Screening (condition at time of admission) Patient's cognitive ability adequate to safely complete daily activities?: Yes Is the patient  deaf or have difficulty hearing?: Yes Does the patient have difficulty seeing, even when wearing glasses/contacts?: Yes Does the patient have difficulty concentrating, remembering, or making decisions?: Yes Patient able to express need for assistance with ADLs?: Yes Does the patient have difficulty dressing or bathing?: Yes Independently performs ADLs?: No Communication: Independent Dressing (OT): Needs assistance Is this a change from baseline?: Change from baseline, expected to last >3 days Grooming: Needs assistance Is this a change from baseline?: Change from baseline, expected to last >3 days Feeding: Independent Bathing: Needs assistance Is this a change from baseline?: Change from baseline, expected to last >3 days Toileting: Needs assistance Is this a change from baseline?: Change from baseline, expected to last >3days In/Out Bed: Needs assistance Is this a change from baseline?: Change from baseline, expected to last >3 days Walks in Home: Needs assistance Is this a change from baseline?: Change from baseline, expected to last >3 days Does the patient have difficulty walking or climbing stairs?: Yes Weakness of Legs: Both Weakness of Arms/Hands: None  Permission Sought/Granted Permission sought to share information with : Facility Sport and exercise psychologist, Family Supports Permission granted to share information with : Yes, Verbal Permission Granted  Share Information with NAME: [name]  Permission granted to share info w AGENCY: SNF  Permission granted to share info w Relationship: daughter  Permission granted to share info w Contact Information: [phone]  Emotional Assessment Appearance:: Appears stated age Attitude/Demeanor/Rapport: Engaged Affect (typically observed): Accepting Orientation: : Oriented to Self, Oriented to Place, Oriented to  Time, Oriented to Situation      Admission diagnosis:  Closed intertrochanteric fracture of left hip ([place])  [S72.142A] Closed displaced comminuted fracture of shaft of left femur, initial encounter ([place]) [S72.352A] Patient Active Problem List   Diagnosis Date Noted   Hyponatremia 03/20/2022   Malnutrition of moderate degree 03/19/2022   Closed intertrochanteric fracture of left hip ([place]) 03/18/2022   Cardiac pacemaker in situ 03/18/2022   DNR (do not resuscitate)/DNI(Do Not Intubate) 03/18/2022   Acute renal failure superimposed on stage 4 chronic kidney disease ([place]) 01/03/2022   Mild vascular neurocognitive disorder 09/05/2019   Major depressive disorder with anxious distress 09/05/2019   Acute postoperative anemia due to expected blood loss 05/21/2019   Anemia of chronic disease 05/21/2019   Anemia in chronic kidney disease 05/21/2019   SSS (sick sinus syndrome) (HCC)    Paroxysmal atrial fibrillation (HCC) - not on systemic anticoagulation due to hx of GI bleeding    S/P placement of cardiac pacemaker    Status post coronary artery stent placement    CAD S/P percutaneous coronary angioplasty 09/27/2018   Abnormal nuclear stress test    Pain of left shoulder joint on movement 06/16/2018   Spinal stenosis of lumbar region 02/28/2018   On anticoagulant therapy 04/22/2017   Tachycardia-bradycardia syndrome ([place]) 12/23/2016   Chronic diastolic CHF (congestive heart failure) ([place]) 08/09/2016   Paroxysmal atrial flutter ([place]) 08/08/2016   Coronary artery disease    Hypertensive heart disease    CKD (chronic kidney disease) stage 4, GFR 15-29 ml/min (HCC)    Hypothyroidism    Osteoarthritis of right knee 07/02/2014   Diabetic peripheral neuropathy associated with type 1 diabetes mellitus ([place]) 09/05/2013   Hypertension associated with diabetes ([place]) 01/06/2012   S/P CABG (coronary artery bypass graft), 12/04/11 12/07/2011   Hyperlipidemia associated with type 2 diabetes mellitus ([place]) 12/03/2011    Class: Diagnosis of   Family history of early CAD 12/01/2011   Insulin dependent type 2  diabetes mellitus ([place]) 12/14/2010   DYSPHAGIA UNSPECIFIED 12/14/2010   ESOPHAGEAL STRICTURE 11/28/2009   GERD 11/28/2009   FLATULENCE-GAS-BLOATING 11/28/2009   CONSTIPATION, CHRONIC 12/19/2007   MENOPAUSE, SURGICAL 12/19/2007   BACK PAIN, LUMBAR 12/19/2007   TOTAL KNEE REPLACEMENT, LEFT, HX OF 12/19/2007   PCP:  Leeroy Cha, MD Pharmacy:   [facility] [address] [place] [address] Phone: [phone] Fax: [phone]     Social Determinants of Health (SDOH) Interventions    Readmission Risk Interventions    02/11/2022    4:50 PM  Readmission Risk Prevention Plan  Transportation Screening Complete  PCP or Specialist Appt within 3-5 Days Complete  HRI or Home Care Consult Complete  Social Work Consult for [place] Planning/Counseling Complete  Palliative Care Screening Not Applicable  Medication Review Press photographer) Complete

## 2022-03-21 NOTE — [place] FL2 (Signed)
[place] MEDICAID FL2 LEVEL OF CARE SCREENING TOOL     IDENTIFICATION  Patient Name: [name] Birthdate: 04-03-1941 Sex: female Admission Date (Current Location): 03/17/2022  [facility] and [place] Number:  Herbalist and Address:  [facility] [place], [place] [address] [place], [place] [address]      Provider Number: [id number]  Attending Physician Name and Address:  [place] Eric J, DO  Relative Name and Phone Number:  [name] [phone]    Current Level of Care: Hospital Recommended Level of Care: [place] Prior Approval Number:    Date Approved/Denied:   PASRR Number: [id number]  Discharge Plan: SNF    Current Diagnoses: Patient Active Problem List   Diagnosis Date Noted   Hyponatremia 03/20/2022   Malnutrition of moderate degree 03/19/2022   Closed intertrochanteric fracture of left hip ([place]) 03/18/2022   Cardiac pacemaker in situ 03/18/2022   DNR (do not resuscitate)/DNI(Do Not Intubate) 03/18/2022   Acute renal failure superimposed on stage 4 chronic kidney disease ([place]) 01/03/2022   Mild vascular neurocognitive disorder 09/05/2019   Major depressive disorder with anxious distress 09/05/2019   Acute postoperative anemia due to expected blood loss 05/21/2019   Anemia of chronic disease 05/21/2019   Anemia in chronic kidney disease 05/21/2019   SSS (sick sinus syndrome) (HCC)    Paroxysmal atrial fibrillation (HCC) - not on systemic anticoagulation due to hx of GI bleeding    S/P placement of cardiac pacemaker    Status post coronary artery stent placement    CAD S/P percutaneous coronary angioplasty 09/27/2018   Abnormal nuclear stress test    Pain of left shoulder joint on movement 06/16/2018   Spinal stenosis of lumbar region 02/28/2018   On anticoagulant therapy 04/22/2017   Tachycardia-bradycardia syndrome ([place]) 12/23/2016   Chronic diastolic CHF (congestive heart failure) ([place]) 08/09/2016    Paroxysmal atrial flutter ([place]) 08/08/2016   Coronary artery disease    Hypertensive heart disease    CKD (chronic kidney disease) stage 4, GFR 15-29 ml/min (HCC)    Hypothyroidism    Osteoarthritis of right knee 07/02/2014   Diabetic peripheral neuropathy associated with type 1 diabetes mellitus ([place]) 09/05/2013   Hypertension associated with diabetes ([place]) 01/06/2012   S/P CABG (coronary artery bypass graft), 12/04/11 12/07/2011   Hyperlipidemia associated with type 2 diabetes mellitus ([place]) 12/03/2011   Family history of early CAD 12/01/2011   Insulin dependent type 2 diabetes mellitus ([place]) 12/14/2010   DYSPHAGIA UNSPECIFIED 12/14/2010   ESOPHAGEAL STRICTURE 11/28/2009   GERD 11/28/2009   FLATULENCE-GAS-BLOATING 11/28/2009   CONSTIPATION, CHRONIC 12/19/2007   MENOPAUSE, SURGICAL 12/19/2007   BACK PAIN, LUMBAR 12/19/2007   TOTAL KNEE REPLACEMENT, LEFT, HX OF 12/19/2007    Orientation RESPIRATION BLADDER Height & Weight     Self, Time, Situation, Place  Normal Continent Weight: 127 lb 6.8 oz (57.8 kg) Height:  5' (152.4 cm)  BEHAVIORAL SYMPTOMS/MOOD NEUROLOGICAL BOWEL NUTRITION STATUS      Continent Diet (see discharge summary)  AMBULATORY STATUS COMMUNICATION OF NEEDS Skin   Limited Assist Verbally Surgical wounds, Other (Comment) (left thigh and hip incision)                       Personal Care Assistance Level of Assistance  Bathing, Dressing Bathing Assistance: Limited assistance   Dressing Assistance: Limited assistance     Functional Limitations Info  Sight, Hearing Sight Info: Impaired (glasses) Hearing Info: Impaired (hearing)  SPECIAL CARE FACTORS FREQUENCY  PT (By licensed PT), OT (By licensed OT)     PT Frequency: per facility OT Frequency: per facility            Contractures      Additional Factors Info  Code Status, Allergies Code Status Info: DNR Allergies Info: Clonidine Derivatives; Clonidine Hcl ;Crestor (rosuvastatin) ;Losartan  Potassium;Sulfa Antibiotics ;Epinephrine;  Hydralazine Hcl           Current Medications (03/21/2022):  This is the current hospital active medication list Current Facility-Administered Medications  Medication Dose Route Frequency Provider Last Rate Last Admin   0.9 %  sodium chloride infusion   Intravenous Continuous [place] Eric J, DO 75 mL/hr at 03/21/22 1223 New Bag at 03/21/22 1223   acetaminophen (TYLENOL) tablet 325-650 mg  325-650 mg Oral Q6H PRN Georgeanna Harrison, MD   650 mg at 03/20/22 2043   alum & mag hydroxide-simeth (MAALOX/MYLANTA) 200-200-20 MG/5ML suspension 30 mL  30 mL Oral Q4H PRN [place] Eric J, DO   30 mL at 03/19/22 2223   amLODipine (NORVASC) tablet 5 mg  5 mg Oral Daily Kristopher Oppenheim, DO   5 mg at 03/21/22 0945   aspirin EC tablet 81 mg  81 mg Oral Daily Kristopher Oppenheim, DO   81 mg at 03/21/22 0945   carvedilol (COREG) tablet 25 mg  25 mg Oral BID Kristopher Oppenheim, DO   25 mg at 03/21/22 0945   Chlorhexidine Gluconate Cloth 2 % PADS 6 each  6 each Topical Daily Lisette Abu, PA-C   6 each at 03/21/22 0946   enoxaparin (LOVENOX) injection 30 mg  30 mg Subcutaneous Q24H Georgeanna Harrison, MD   30 mg at 03/21/22 0945   feeding supplement (BOOST / RESOURCE BREEZE) liquid 1 Container  1 Container Oral BID BM [place] Donnamarie Poag, DO   1 Container at 03/20/22 1012   ferrous sulfate tablet 325 mg  325 mg Oral BID WC Gary Fleet, PA-C   325 mg at 03/21/22 0945   gabapentin (NEURONTIN) capsule 300 mg  300 mg Oral QHS Kristopher Oppenheim, DO   300 mg at 03/20/22 2043   hydrALAZINE (APRESOLINE) injection 10 mg  10 mg Intravenous Q6H PRN [place] Eric J, DO       HYDROcodone-acetaminophen (NORCO) 7.5-325 MG per tablet 1-2 tablet  1-2 tablet Oral Q4H PRN Georgeanna Harrison, MD       HYDROcodone-acetaminophen (NORCO/VICODIN) 5-325 MG per tablet 1-2 tablet  1-2 tablet Oral Q4H PRN Georgeanna Harrison, MD   1 tablet at 03/20/22 1340   HYDROmorphone (DILAUDID) injection 0.5 mg  0.5 mg Intravenous Q2H PRN Kristopher Oppenheim,  DO       insulin aspart (novoLOG) injection 0-15 Units  0-15 Units Subcutaneous TID WC [place] Eric J, DO   3 Units at 03/21/22 1220   insulin glargine-yfgn [facility]) injection 25 Units  25 Units Subcutaneous QHS [place] Eric J, DO   25 Units at 03/20/22 2123   isosorbide mononitrate (IMDUR) 24 hr tablet 30 mg  30 mg Oral Daily Kristopher Oppenheim, DO   30 mg at 03/21/22 0945   levothyroxine (SYNTHROID) tablet 100 mcg  100 mcg Oral QHS [place] Eric J, DO   100 mcg at 03/20/22 2044   menthol-cetylpyridinium (CEPACOL) lozenge 3 mg  1 lozenge Oral PRN Georgeanna Harrison, MD       Or   phenol (CHLORASEPTIC) mouth spray 1 spray  1 spray Mouth/Throat PRN Georgeanna Harrison, MD  morphine (PF) 2 MG/ML injection 0.5-1 mg  0.5-1 mg Intravenous Q2H PRN Georgeanna Harrison, MD       multivitamin with minerals tablet 1 tablet  1 tablet Oral Daily [place] Donnamarie Poag, DO   1 tablet at 03/21/22 0945   ondansetron (ZOFRAN) tablet 4 mg  4 mg Oral Q6H PRN Georgeanna Harrison, MD       Or   ondansetron [facility]) injection 4 mg  4 mg Intravenous Q6H PRN Georgeanna Harrison, MD   4 mg at 03/18/22 2327   pantoprazole (PROTONIX) EC tablet 40 mg  40 mg Oral QAC breakfast Kristopher Oppenheim, DO   40 mg at 03/21/22 0945   polyethylene glycol (MIRALAX / GLYCOLAX) packet 17 g  17 g Oral Daily [place] Eric J, DO   17 g at 03/21/22 0945   rosuvastatin (CRESTOR) tablet 10 mg  10 mg Oral QHS Kristopher Oppenheim, DO   10 mg at 03/20/22 2043   senna-docusate (Senokot-S) tablet 2 tablet  2 tablet Oral BID [place] Eric J, DO   2 tablet at 03/21/22 0945   sertraline (ZOLOFT) tablet 25 mg  25 mg Oral Daily [place] Eric J, DO   25 mg at 03/21/22 0945     Discharge Medications: Please see discharge summary for a list of discharge medications.  Relevant Imaging Results:  Relevant Lab Results:   Additional [place] COVID-19 Vaccine 01/07/2020 , 12/17/2019 SS# [id number]  [place], LCSWA

## 2022-03-21 NOTE — Progress Notes (Addendum)
PROGRESS NOTE    [name]  TWS:[id number] DOB: [birth year] DOA: 03/17/2022 PCP: Leeroy Cha, MD    Brief Narrative:   [name] is a 81 y.o. female with past medical history significant for paroxysmal atrial fibrillation, SSS s/p PPM, CAD s/p CABG x3, type 2 diabetes mellitus, hypothyroidism, hyperlipidemia, chronic diastolic congestive heart failure, essential hypertension, CKD stage IV hyperlipidemia history of GI bleed who presented to [facility] ED on 5/24 following mechanical fall at home.  Patient states was getting ready for bed, dropped an item on the ground and when she tried to pick it up she fell onto her right side.  She reported immediate pain to her left hip and was brought to the ED via EMS for further evaluation.  In the ED, temperature 97.9 degrees Fahrenheit, HR 60, RR 16, BP 168/63, SPO2 100% on room air.  Sodium 139, potassium 4.3, chloride 104, CO2 24, glucose 68, BUN 58, creatinine 2.43.  WBC 11.1, hemoglobin 10.4, platelets 155.  Urinalysis unrevealing.  CT head and C-spine with no acute intracranial injury, no calvarial fracture, no acute fracture or listhesis of the C-spine.  Pelvis/left femur with comminuted angulated intratrochanteric fracture of the proximal left femur.  Left humerus x-ray with bony deformity of the left humeral head/neck, unchanged from previous exam and likely related to old trauma, no acute fractures identified.  Orthopedics was consulted.  Hospital service consulted for further evaluation and management for acute left hip fracture.  Assessment & Plan:   Left hip fracture Patient presenting to ED via EMS following mechanical fall at home without loss of consciousness.  Imaging left pelvis/hip with comminuted angulated intratrochanteric fracture of the proximal left femur.  Orthopedics was consulted and patient underwent left intratrochanteric intramedullary nail by Dr. Mable Fill on 03/19/2022. --Weightbearing as tolerates left lower  extremity --Norco 5-325 mg q4h PRN moderate pain --Dilaudid 0.5 mg IV q2h PRN severe pain --DVT prophylaxis with Lovenox x6 weeks postoperatively per orthopedics --Continue PT/OT efforts while inpatient, pending SNF placement --Outpatient follow-up with [facility] 2 weeks postop  Acute postoperative blood loss anemia --Hgb 10.4>7.1>9.5>7.6>8.4>7.0 --Transfused 1 unit PRBC 5/26; transfuse additional unit pRBC today --started on ferrous sulfate 325 mg p.o. twice daily --repeat H/H following transfusion and CBC in the a.m.  [name] on CKD stage IV Follows with [facility], Dr. Moshe Cipro outpatient. Creatinine baseline 2.2-2.4.  Renal ultrasound with no evidence of nephrolithiasis or hydronephrosis, thinning of the renal cortices and increased echogenicity consistent with medical renal disease, urinary bladder unremarkable --Cr 2.43>2.84>3.39>3.57 --Avoid nephrotoxins, renally dose medications --Holding home torsemide --NS at 75 mL/h per hour --Strict I's and O's -- BMP daily  Hyponatremia Etiology likely secondary to hypovolemic hyponatremia in the setting of poor oral intake/dehydration. --Na 139>132>131>135 --NS at 75 mL/h --BMP daily  Chronic diastolic congestive heart failure, compensated Follows with cardiology, Dr. Debara Pickett outpatient.  TTE 01/06/2022 LVEF 55-60%, LV normal function, LV has no regional wall motion normalities, mild LVH, grade 2 diastolic dysfunction, LA mildly dilated, moderate TR, mild MR, no aortic stenosis, IVC normal in size. --Carvedilol 25 mg p.o. twice daily --Imdur 30 mg p.o. daily --Holding home torsemide 40 mg p.o. daily in the setting of increased creatinine and hyponatremia --Strict I's and O's and daily weights  SSS s/p PPM Followed by electrophysiology, Dr. Curt Bears outpatient.  Paroxysmal atrial flutter/fibrillation No longer on anticoagulation due to prior history of GI bleed.  Type 2 diabetes mellitus, with  hyperglycemia Home regimen includes Lantus 20 units subcutaneously daily --  Semglee 25 units Superior daily --SSI for coverage --CBGs qAC/HS  Peripheral neuropathy --Gabapentin 300 mg PO qHS  CAD Prior history of three-vessel CABG 2013. --Continue aspirin, statin, beta-blocker, Imdur  Essential hypertension Home medication regimen includes carvedilol 25 mg p.o. daily, amlodipine 5 mg p.o. daily, isosorbide mononitrate 30 mg p.o. daily, torsemide 40 mg p.o. daily. --Continue carvedilol, amlodipine, isosorbide mononitrate --Holding home torsemide as above --Hydralazine '10mg'$  IV q6h PRN SBP >165 or DBP >110  Thrombocytopenia, chronic --Platelet count 155>106>97>89 --Continue monitor CBC daily given patient on Lovenox and aspirin  Hyperlipidemia --Crestor 10 mg p.o. daily  Hypothyroidism TSH 0.135 and free T for 1.50 on 02/11/2022. --Levothyroxine 100 mcg p.o. daily  Depression/anxiety --Sertraline 25 mg p.o. daily  Moderate protein calorie malnutrition Nutrition Status: Nutrition Problem: Moderate Malnutrition Etiology: chronic illness (CHF, CKD) Signs/Symptoms: moderate muscle depletion, moderate fat depletion Interventions: Ensure Enlive (each supplement provides 350kcal and 20 grams of protein), MVI --Dietitian following, continue to encourage increased oral intake, supplementation  DVT prophylaxis: enoxaparin (LOVENOX) injection 30 mg Start: 03/19/22 1000 SCDs Start: 03/18/22 2031 SCDs Start: 03/18/22 1435 SCDs Start: 03/18/22 1009    Code Status: DNR Family Communication: No family present at bedside this morning  Disposition Plan:  Level of care: Med-Surg Status is: Inpatient Remains inpatient appropriate because: Pending SNF placement; per CIR admissions coordinator [facility] Medicare unlikely to approve CIR for primary diagnosis of hip fracture    Consultants:  Orthopedics, Dr. Mable Fill  Procedures:  Left intertrochanteric intramedullary nail, Dr. Mable Fill  5/25  Antimicrobials:  Perioperative cefazolin   Subjective: Patient seen examined bedside, resting comfortably in bed.  No specific complaints this morning.  Reports poor oral intake, poor appetite.  Hemoglobin down to 7.0 this morning, discussed additional blood transfusion today.  Started on ferrous sulfate yesterday.  No other questions this morning.  Denies headache, no dizziness, no chest pain, no palpitations, no shortness of breath, no abdominal pain, no fever/chills/night sweats, no nausea/vomiting/diarrhea, no focal weakness, no cough/congestion, no fatigue, no paresthesia.  No acute events overnight per nursing staff.  Objective: Vitals:   03/21/22 0403 03/21/22 0749 03/21/22 0932 03/21/22 0947  BP: (!) 124/41 (!) 102/49 (!) 130/48 (!) 138/42  Pulse: 60 60 (!) 59 62  Resp: '16 19 18 18  '$ Temp: 98.5 F (36.9 C) 98.3 F (36.8 C) 98.2 F (36.8 C) 98.2 F (36.8 C)  TempSrc: Oral Oral Oral Oral  SpO2: 95% 97% 98% 99%  Weight:      Height:        Intake/Output Summary (Last 24 hours) at 03/21/2022 1137 Last data filed at 03/21/2022 0947 Gross per 24 hour  Intake 1250.71 ml  Output --  Net 1250.71 ml   Filed Weights   03/18/22 0121 03/18/22 1258  Weight: 56 kg 57.8 kg    Examination:  Physical Exam: GEN: NAD, alert and oriented x 3, elderly in appearance HEENT: NCAT, PERRL, EOMI, sclera clear, MMM PULM: CTAB w/o wheezes/crackles, normal respiratory effort, on room air CV: RRR w/o M/G/R GI: abd soft, NTND, NABS, no R/G/M MSK: no peripheral edema, moves all extremities independently, sensation to light touch intact, neurovascularlly intact, surgical site noted with dressings in place, clean/dry/intact PSYCH: normal mood/affect Integumentary: dry/intact, no rashes or wounds    Data Reviewed: I have personally reviewed following labs and imaging studies  CBC: Recent Labs  Lab 03/18/22 0040 03/19/22 0057 03/19/22 1235 03/20/22 0205 03/20/22 1404 03/21/22 0240   WBC 11.1* 6.2  --  11.2*  --  7.0  NEUTROABS 8.7*  --   --   --   --   --   HGB 10.4* 7.1* 9.5* 7.6* 8.4* 7.0*  HCT 30.1* 20.8* 26.8* 21.6* 24.3* 20.8*  MCV 95.6 95.9  --  91.1  --  94.1  PLT 155 106*  --  97*  --  89*   Basic Metabolic Panel: Recent Labs  Lab 03/18/22 0040 03/19/22 0057 03/20/22 0205 03/21/22 0240  NA 139 132* 131* 135  K 4.3 4.2 4.5 4.2  CL 104 103 99 103  CO2 24 19* 22 24  GLUCOSE 68* 310* 149* 95  BUN 58* 63* 88* 95*  CREATININE 2.43* 2.84* 3.39* 3.57*  CALCIUM 9.0 8.0* 7.7* 7.4*   GFR: Estimated Creatinine Clearance: 9.7 mL/min (A) (by C-G formula based on SCr of 3.57 mg/dL (H)). Liver Function Tests: No results for input(s): AST, ALT, ALKPHOS, BILITOT, PROT, ALBUMIN in the last 168 hours. No results for input(s): LIPASE, AMYLASE in the last 168 hours. No results for input(s): AMMONIA in the last 168 hours. Coagulation Profile: No results for input(s): INR, PROTIME in the last 168 hours. Cardiac Enzymes: No results for input(s): CKTOTAL, CKMB, CKMBINDEX, TROPONINI in the last 168 hours. BNP (last 3 results) No results for input(s): PROBNP in the last 8760 hours. HbA1C: No results for input(s): HGBA1C in the last 72 hours. CBG: Recent Labs  Lab 03/20/22 1133 03/20/22 1712 03/20/22 2121 03/21/22 0049 03/21/22 0745  GLUCAP 184* 163* 150* 116* 117*   Lipid Profile: No results for input(s): CHOL, HDL, LDLCALC, TRIG, CHOLHDL, LDLDIRECT in the last 72 hours. Thyroid Function Tests: No results for input(s): TSH, T4TOTAL, FREET4, T3FREE, THYROIDAB in the last 72 hours. Anemia Panel: Recent Labs    03/21/22 0240  VITAMINB12 261  FOLATE 10.6  FERRITIN 271  TIBC 183*  IRON 31  RETICCTPCT 1.8   Sepsis Labs: No results for input(s): PROCALCITON, LATICACIDVEN in the last 168 hours.  Recent Results (from the past 240 hour(s))  Surgical pcr screen     Status: None   Collection Time: 03/18/22  1:14 PM   Specimen: Nasal Mucosa; Nasal Swab   Result Value Ref Range Status   MRSA, PCR NEGATIVE NEGATIVE Final   Staphylococcus aureus NEGATIVE NEGATIVE Final    Comment: (NOTE) The Xpert SA Assay (FDA approved for NASAL specimens in patients 74 years of age and older), is one component of a comprehensive surveillance program. It is not intended to diagnose infection nor to guide or monitor treatment. Performed at [facility], [place] [address], [place], [place] [address]          Radiology Studies: US RENAL  Result Date: 03/21/2022 CLINICAL DATA:  Chronic renal failure EXAM: RENAL / URINARY TRACT ULTRASOUND COMPLETE COMPARISON:  None Available. FINDINGS: Right Kidney: Renal measurements: 10.3 x 5.1 x 4.3 cm = volume: 118.2 mL. Thinning and increased echogenicity of the renal cortex. No mass or hydronephrosis visualized. Anechoic avascular cystic structure in the upper pole measuring 0.5 x 0.8 x 0.4 cm. Left Kidney: Renal measurements: 8.7 x 4.9 x 5.0 cm = volume: 12.5 mL. Thinning of the cortex with increased echogenicity. No mass or hydronephrosis visualized. Exophytic Anechoic avascular cyst in the interpolar region measuring approximately 1.6 x 1.6 x 1.8 cm. Bladder: Appears normal for degree of bladder distention. Ureteral jets were not seen. Other: None. IMPRESSION: 1. No evidence of nephrolithiasis or hydronephrosis. Thinning and increased echogenicity of the renal cortices concerning for medical renal disease.  2.  Urinary bladder is unremarkable. Electronically Signed   By: Keane Police D.O.   On: 03/21/2022 10:10        Scheduled Meds:  amLODipine  5 mg Oral Daily   aspirin EC  81 mg Oral Daily   carvedilol  25 mg Oral BID   Chlorhexidine Gluconate Cloth  6 each Topical Daily   enoxaparin (LOVENOX) injection  30 mg Subcutaneous Q24H   feeding supplement  1 Container Oral BID BM   ferrous sulfate  325 mg Oral BID WC   gabapentin  300 mg Oral QHS   insulin aspart  0-15 Units Subcutaneous TID WC   insulin  glargine-yfgn  25 Units Subcutaneous QHS   isosorbide mononitrate  30 mg Oral Daily   levothyroxine  100 mcg Oral QHS   multivitamin with minerals  1 tablet Oral Daily   pantoprazole  40 mg Oral QAC breakfast   polyethylene glycol  17 g Oral Daily   rosuvastatin  10 mg Oral QHS   senna-docusate  2 tablet Oral BID   sertraline  25 mg Oral Daily   Continuous Infusions:      LOS: 3 days    Time spent: 49 minutes spent on chart review, discussion with nursing staff, consultants, updating family and interview/physical exam; more than 50% of that time was spent in counseling and/or coordination of care.    Yitzhak Awan J [place] DO [facility] Available via Epic secure chat 7am-7pm After these hours, please refer to coverage provider listed on [email or web address] 03/21/2022, 11:37 AM

## 2022-03-22 DIAGNOSIS — S72142A Displaced intertrochanteric fracture of left femur, initial encounter for closed fracture: Secondary | ICD-10-CM | POA: Diagnosis not present

## 2022-03-22 DIAGNOSIS — N184 Chronic kidney disease, stage 4 (severe): Secondary | ICD-10-CM | POA: Diagnosis not present

## 2022-03-22 DIAGNOSIS — D62 Acute posthemorrhagic anemia: Secondary | ICD-10-CM | POA: Diagnosis not present

## 2022-03-22 DIAGNOSIS — N179 Acute kidney failure, unspecified: Secondary | ICD-10-CM | POA: Diagnosis not present

## 2022-03-22 LAB — BASIC METABOLIC PANEL
Anion gap: 10 (ref 5–15)
BUN: 97 mg/dL — ABNORMAL HIGH (ref 8–23)
CO2: 19 mmol/L — ABNORMAL LOW (ref 22–32)
Calcium: 7.4 mg/dL — ABNORMAL LOW (ref 8.9–10.3)
Chloride: 107 mmol/L (ref 98–111)
Creatinine, Ser: 2.87 mg/dL — ABNORMAL HIGH (ref 0.44–1.00)
GFR, Estimated: 16 mL/min — ABNORMAL LOW (ref >60)
Glucose, Bld: 107 mg/dL — ABNORMAL HIGH (ref 70–99)
Potassium: 4.7 mmol/L (ref 3.5–5.1)
Sodium: 136 mmol/L (ref 135–145)

## 2022-03-22 LAB — BPAM RBC
Blood Product Expiration Date: 202305302359
Blood Product Expiration Date: 202306022359
ISSUE DATE / TIME: 202305260711
ISSUE DATE / TIME: 202305280926
Unit Type and Rh: 600
Unit Type and Rh: 9500

## 2022-03-22 LAB — GLUCOSE, CAPILLARY
Glucose-Capillary: 72 mg/dL (ref 70–99)
Glucose-Capillary: 62 mg/dL — ABNORMAL LOW (ref 70–99)
Glucose-Capillary: 129 mg/dL — ABNORMAL HIGH (ref 70–99)
Glucose-Capillary: 229 mg/dL — ABNORMAL HIGH (ref 70–99)
Glucose-Capillary: 137 mg/dL — ABNORMAL HIGH (ref 70–99)
Glucose-Capillary: 133 mg/dL — ABNORMAL HIGH (ref 70–99)

## 2022-03-22 LAB — TYPE AND SCREEN
ABO/RH(D): A NEG
Antibody Screen: NEGATIVE
Unit division: 0
Unit division: 0

## 2022-03-22 LAB — CBC
HCT: 24.7 % — ABNORMAL LOW (ref 36.0–46.0)
Hemoglobin: 8.4 g/dL — ABNORMAL LOW (ref 12.0–15.0)
MCH: 31.9 pg (ref 26.0–34.0)
MCHC: 34 g/dL (ref 30.0–36.0)
MCV: 93.9 fL (ref 80.0–100.0)
Platelets: 109 10*3/uL — ABNORMAL LOW (ref 150–400)
RBC: 2.63 MIL/uL — ABNORMAL LOW (ref 3.87–5.11)
RDW: 15.1 % (ref 11.5–15.5)
WBC: 9 10*3/uL (ref 4.0–10.5)
nRBC: 0 % (ref 0.0–0.2)

## 2022-03-22 MED ORDER — INSULIN GLARGINE-YFGN 100 UNIT/ML ~~LOC~~ SOLN
20.0000 [IU] | Freq: Every day | SUBCUTANEOUS | Status: DC
  Administered 2022-03-22: 20 [IU] via SUBCUTANEOUS
  Filled 2022-03-22 (×2): qty 0.2

## 2022-03-22 MED ORDER — BISACODYL 10 MG RE SUPP
10.0000 mg | Freq: Every day | RECTAL | Status: DC | PRN
  Administered 2022-03-22: 10 mg via RECTAL
  Filled 2022-03-22: qty 1

## 2022-03-22 NOTE — Progress Notes (Signed)
Physical Therapy Treatment Patient Details Name: [name] MRN: [id number] DOB: 1941-01-27 Today's Date: 03/22/2022   History of Present Illness 81 y.o. female  who presents after mechanical fall on 03/17/22. L IM nail completed on 03/18/22. PMH significant of HOH, CKD, paroxsymal atrial fibrillation, SSS s/p pacemaker, CAD s/p CABG x3, insulin-dependent D5HG, chronic diastolic HF, HTN, and HLD    PT Comments    Pt requiring modA for bed mobility and transfers, minA for ambulation of 20f with RW. Pt able to ambulate to the bathroom but not all the way back afterwards, required pulling recliner up to bathroom door to sit. Pt unable to perform pericare, therapist assistance required. Pt mobility limited by decreased strength and weight bearing tolerance of LLE but demonstrating increased independence level this session.    Recommendations for follow up therapy are one component of a multi-disciplinary discharge planning process, led by the attending physician.  Recommendations may be updated based on patient status, additional functional criteria and insurance authorization.  Follow Up Recommendations  Acute inpatient rehab (3hours/day)     Assistance Recommended at Discharge Frequent or constant Supervision/Assistance  Patient can return home with the following A lot of help with walking and/or transfers;A lot of help with bathing/dressing/bathroom;Help with stairs or ramp for entrance;Assist for transportation;Assistance with cooking/housework   Equipment Recommendations  Rolling walker (2 wheels)    Recommendations for Other Services       Precautions / Restrictions Precautions Precautions: Fall Restrictions Weight Bearing Restrictions: Yes LLE Weight Bearing: Weight bearing as tolerated     Mobility  Bed Mobility Overal bed mobility: Needs Assistance Bed Mobility: Supine to Sit     Supine to sit: Mod assist     General bed mobility comments: manual assistance for  LLE, assist for trunk support to get to upright position and pad assist to scoot hips    Transfers Overall transfer level: Needs assistance Equipment used: Rolling walker (2 wheels) Transfers: Sit to/from Stand Sit to Stand: Mod assist           General transfer comment: STS from EOB and BSC. Cues for foot placement. Pt able to initate movement but requiring modA to achieve full stand    Ambulation/Gait Ambulation/Gait assistance: Min assist Gait Distance (Feet): 15 Feet Assistive device: Rolling walker (2 wheels) Gait Pattern/deviations: Step-to pattern, Decreased step length - left, Decreased stance time - left, Decreased weight shift to left Gait velocity: decreased Gait velocity interpretation: <1.8 ft/sec, indicate of risk for recurrent falls   General Gait Details: Pt able to ambulate to bathroom, cues for hand placement on walker and picking up LLE to advance. Pt demosntrating difficulty with weight shift to R to adavnce LLE. Would benefit from cues for sequencing   Stairs             Wheelchair Mobility    Modified Rankin (Stroke Patients Only)       Balance Overall balance assessment: Needs assistance Sitting-balance support: Feet supported, Bilateral upper extremity supported Sitting balance-Leahy Scale: Poor Sitting balance - Comments: Pt with posterior lean when performing therex at EOB despite BUE support Postural control: Posterior lean Standing balance support: Bilateral upper extremity supported, During functional activity Standing balance-Leahy Scale: Fair Standing balance comment: reliant on RW                            Cognition Arousal/Alertness: Awake/alert Behavior During Therapy: WFL for tasks assessed/performed Overall Cognitive Status: Within Functional  Limits for tasks assessed                                          Exercises General Exercises - Lower Extremity Long Arc Quad: 10 reps Heel Slides:  AAROM, 10 reps    General Comments        Pertinent Vitals/Pain Pain Assessment Pain Assessment: Faces Faces Pain Scale: Hurts little more Pain Location: L hip Pain Descriptors / Indicators: Discomfort, Grimacing Pain Intervention(s): Monitored during session    Home Living                          Prior Function            PT Goals (current goals can now be found in the care plan section) Acute Rehab PT Goals Patient Stated Goal: to improve, return home PT Goal Formulation: With patient/family Time For Goal Achievement: 04/02/22 Potential to Achieve Goals: Good Progress towards PT goals: Progressing toward goals    Frequency    Min 3X/week      PT Plan Current plan remains appropriate    Co-evaluation              AM-PAC PT "6 Clicks" Mobility   Outcome Measure  Help needed turning from your back to your side while in a flat bed without using bedrails?: A Little Help needed moving from lying on your back to sitting on the side of a flat bed without using bedrails?: A Lot Help needed moving to and from a bed to a chair (including a wheelchair)?: A Lot Help needed standing up from a chair using your arms (e.g., wheelchair or bedside chair)?: A Lot Help needed to walk in hospital room?: A Little Help needed climbing 3-5 steps with a railing? : A Lot 6 Click Score: 14    End of Session Equipment Utilized During Treatment: Gait belt Activity Tolerance: Patient tolerated treatment well Patient left: in chair;with call bell/phone within reach;with chair alarm set Nurse Communication: Mobility status PT Visit Diagnosis: Unsteadiness on feet (R26.81);Other abnormalities of gait and mobility (R26.89);Muscle weakness (generalized) (M62.81);Repeated falls (R29.6);Difficulty in walking, not elsewhere classified (R26.2);Pain Pain - Right/Left: Left Pain - part of body: Hip     Time: 3709-6438 PT Time Calculation (min) (ACUTE ONLY): 38 min  Charges:   $Gait Training: 23-37 mins $Therapeutic Activity: 8-22 mins                     Mackie Pai, SPT Acute Rehabilitation Services  Office: [place] 03/22/2022, 11:18 AM

## 2022-03-22 NOTE — Care Management Important Message (Signed)
Important Message  Patient Details  Name: [name] MRN: [id number] Date of Birth: [birth year]   Medicare Important Message Given:  Yes     Orbie Pyo 03/22/2022, 2:22 PM

## 2022-03-22 NOTE — Progress Notes (Signed)
Blood Sugar 62 orange juice give will cont, to monitor

## 2022-03-22 NOTE — Plan of Care (Signed)
  Problem: Activity: Goal: Risk for activity intolerance will decrease Outcome: Progressing   Problem: Nutrition: Goal: Adequate nutrition will be maintained Outcome: Progressing   Problem: Coping: Goal: Level of anxiety will decrease Outcome: Progressing   Problem: Elimination: Goal: Will not experience complications related to bowel motility Outcome: Progressing   

## 2022-03-22 NOTE — Progress Notes (Signed)
Subjective: 4 Days Post-Op Procedure(s) (LRB): INTRAMEDULLARY (IM) NAIL INTERTROCHANTRIC (Left) Patient reports pain as mild.  Patient sitting up in chair.  Daughter at bedside.  Reports that she ambulated to the bathroom this morning.  Objective: Vital signs in last 24 hours: Temp:  [98.2 F (36.8 C)-99 F (37.2 C)] 98.6 F (37 C) (05/29 0742) Pulse Rate:  [60-66] 60 (05/29 0742) Resp:  [16-19] 16 (05/29 0742) BP: (140-160)/(45-52) 160/46 (05/29 0742) SpO2:  [95 %-99 %] 95 % (05/29 0742)  Intake/Output from previous day: 05/28 0701 - 05/29 0700 In: 816 [P.O.:480; Blood:336] Out: -  Intake/Output this shift: Total I/O In: 240 [P.O.:240] Out: -   Recent Labs    03/20/22 0205 03/20/22 1404 03/21/22 0240 03/21/22 1525 03/22/22 0143  HGB 7.6* 8.4* 7.0* 8.6* 8.4*   Recent Labs    03/21/22 0240 03/21/22 1525 03/22/22 0143  WBC 7.0  --  9.0  RBC 2.21*  2.20*  --  2.63*  HCT 20.8* 24.6* 24.7*  PLT 89*  --  109*   Recent Labs    03/21/22 0240 03/22/22 0143  NA 135 136  K 4.2 4.7  CL 103 107  CO2 24 19*  BUN 95* 97*  CREATININE 3.57* 2.87*  GLUCOSE 95 107*  CALCIUM 7.4* 7.4*   No results for input(s): LABPT, INR in the last 72 hours. Left hip exam: Aquacel dressing clean and dry. Good ankle plantar and dorsiflexor strength. Left calf soft and nontender.  Minimal swelling in left thigh.    Assessment/Plan: 4 Days Post-Op Procedure(s) (LRB): INTRAMEDULLARY (IM) NAIL INTERTROCHANTRIC (Left) Acute blood loss anemia, improved.  Had transfusion of 1 unit packed RBCs yesterday. Plan: Continue getting up with PT.  Weightbearing as tolerated on left. Continue oral iron supplementation. Lovenox 40 mg subcu daily x6 weeks for DVT prophylaxis. Hopefully will go to SNF tomorrow. Discussed plan with the patient and her daughter.   Erlene Senters 03/22/2022, 10:55 AM

## 2022-03-22 NOTE — Progress Notes (Signed)
TOC CSW is currently awaiting bed offers.  Pts information was sent out on 03/21/2022.  [name], MSW, LCSW-A Pronouns:  She/Her/Hers [facility] HealthTransitions of Care Clinical Social Worker Direct Number:  [phone] [email or web address]

## 2022-03-22 NOTE — Progress Notes (Signed)
PROGRESS NOTE    [name]  RDE:[id number] DOB: February 02, 1941 DOA: 03/17/2022 PCP: Leeroy Cha, MD    Brief Narrative:   [name] is a 81 y.o. female with past medical history significant for paroxysmal atrial fibrillation, SSS s/p PPM, CAD s/p CABG x3, type 2 diabetes mellitus, hypothyroidism, hyperlipidemia, chronic diastolic congestive heart failure, essential hypertension, CKD stage IV hyperlipidemia history of GI bleed who presented to [facility] ED on 5/24 following mechanical fall at home.  Patient states was getting ready for bed, dropped an item on the ground and when she tried to pick it up she fell onto her right side.  She reported immediate pain to her left hip and was brought to the ED via EMS for further evaluation.  In the ED, temperature 97.9 degrees Fahrenheit, HR 60, RR 16, BP 168/63, SPO2 100% on room air.  Sodium 139, potassium 4.3, chloride 104, CO2 24, glucose 68, BUN 58, creatinine 2.43.  WBC 11.1, hemoglobin 10.4, platelets 155.  Urinalysis unrevealing.  CT head and C-spine with no acute intracranial injury, no calvarial fracture, no acute fracture or listhesis of the C-spine.  Pelvis/left femur with comminuted angulated intratrochanteric fracture of the proximal left femur.  Left humerus x-ray with bony deformity of the left humeral head/neck, unchanged from previous exam and likely related to old trauma, no acute fractures identified.  Orthopedics was consulted.  Hospital service consulted for further evaluation and management for acute left hip fracture.  Assessment & Plan:   Left hip fracture Patient presenting to ED via EMS following mechanical fall at home without loss of consciousness.  Imaging left pelvis/hip with comminuted angulated intratrochanteric fracture of the proximal left femur.  Orthopedics was consulted and patient underwent left intratrochanteric intramedullary nail by Dr. Mable Fill on 03/19/2022. --Weightbearing as tolerates left lower  extremity --Norco 5-325 mg q4h PRN moderate pain --Dilaudid 0.5 mg IV q2h PRN severe pain --DVT prophylaxis with Lovenox x 6 weeks postoperatively per orthopedics --Continue PT/OT efforts while inpatient, pending SNF placement --Outpatient follow-up with [facility] 2 weeks postop  Acute postoperative blood loss anemia --Hgb 10.4>7.1>9.5>7.6>8.4>7.0>8.6>8.4 --Transfused 1 unit PRBC 5/26 and 5/28 --started on ferrous sulfate 325 mg p.o. twice daily --repeat H/H following transfusion and CBC in the a.m.  [name] on CKD stage IV Follows with [facility], Dr. Moshe Cipro outpatient. Creatinine baseline 2.2-2.4.  Renal ultrasound with no evidence of nephrolithiasis or hydronephrosis, thinning of the renal cortices and increased echogenicity consistent with medical renal disease, urinary bladder unremarkable --Cr 2.43>2.84>3.39>3.57>2.87 --Avoid nephrotoxins, renally dose medications --Holding home torsemide --NS at 75 mL/h per hour --Strict I's and O's --BMP daily  Hyponatremia Etiology likely secondary to hypovolemic hyponatremia in the setting of poor oral intake/dehydration. --Na 139>132>131>135>136 --NS at 75 mL/h --BMP daily  Chronic diastolic congestive heart failure, compensated Follows with cardiology, Dr. Debara Pickett outpatient.  TTE 01/06/2022 LVEF 55-60%, LV normal function, LV has no regional wall motion normalities, mild LVH, grade 2 diastolic dysfunction, LA mildly dilated, moderate TR, mild MR, no aortic stenosis, IVC normal in size. --Carvedilol 25 mg p.o. twice daily --Imdur 30 mg p.o. daily --Holding home torsemide 40 mg p.o. daily in the setting of increased creatinine and hyponatremia --Strict I's and O's and daily weights  SSS s/p PPM Followed by electrophysiology, Dr. Curt Bears outpatient.  Paroxysmal atrial flutter/fibrillation No longer on anticoagulation due to prior history of GI bleed.  Type 2 diabetes mellitus, with hyperglycemia Home  regimen includes Lantus 20 units subcutaneously daily --Semglee 25 units  [facility] daily --SSI for coverage --CBGs qAC/HS  Peripheral neuropathy --Gabapentin 300 mg PO qHS  CAD Prior history of three-vessel CABG 2013. --Continue aspirin, statin, beta-blocker, Imdur  Essential hypertension Home medication regimen includes carvedilol 25 mg p.o. daily, amlodipine 5 mg p.o. daily, isosorbide mononitrate 30 mg p.o. daily, torsemide 40 mg p.o. daily. --Continue carvedilol, amlodipine, isosorbide mononitrate --Holding home torsemide as above --Hydralazine '10mg'$  IV q6h PRN SBP >165 or DBP >110  Thrombocytopenia, chronic --Platelet count 155>106>97>89 --Continue monitor CBC daily given patient on Lovenox and aspirin  Hyperlipidemia --Crestor 10 mg p.o. daily  Hypothyroidism TSH 0.135 and free T4 1.50 on 02/11/2022. --Levothyroxine 100 mcg p.o. daily  Depression/anxiety --Sertraline 25 mg p.o. daily  Moderate protein calorie malnutrition Nutrition Status: Nutrition Problem: Moderate Malnutrition Etiology: chronic illness (CHF, CKD) Signs/Symptoms: moderate muscle depletion, moderate fat depletion Interventions: Ensure Enlive (each supplement provides 350kcal and 20 grams of protein), MVI --Dietitian following, continue to encourage increased oral intake, supplementation  DVT prophylaxis: enoxaparin (LOVENOX) injection 30 mg Start: 03/19/22 1000 SCDs Start: 03/18/22 2031 SCDs Start: 03/18/22 1435 SCDs Start: 03/18/22 1009    Code Status: DNR Family Communication: No family present at bedside this morning  Disposition Plan:  Level of care: Med-Surg Status is: Inpatient Remains inpatient appropriate because: Pending SNF placement; per CIR admissions coordinator [facility] Medicare unlikely to approve CIR for primary diagnosis of hip fracture    Consultants:  Orthopedics, Dr. Mable Fill  Procedures:  Left intertrochanteric intramedullary nail, Dr. Mable Fill 5/25  Antimicrobials:   Perioperative cefazolin   Subjective: Patient seen examined bedside, resting comfortably in bed.  No specific complaints this morning.  Ordering breakfast on the phone.  States was able to eat and drink a little more yesterday. No other questions this morning.  Denies headache, no dizziness, no chest pain, no palpitations, no shortness of breath, no abdominal pain, no fever/chills/night sweats, no nausea/vomiting/diarrhea, no focal weakness, no cough/congestion, no fatigue, no paresthesia.  No acute events overnight per nursing staff.  Pending SNF placement.  Objective: Vitals:   03/21/22 1220 03/21/22 2015 03/22/22 0427 03/22/22 0742  BP: (!) 155/45 (!) 144/52 (!) 140/52 (!) 160/46  Pulse: 64 60 66 60  Resp: '16 19 18 16  '$ Temp: 98.8 F (37.1 C) 98.2 F (36.8 C) 99 F (37.2 C) 98.6 F (37 C)  TempSrc: Oral Oral Axillary Oral  SpO2: 98% 99% 96% 95%  Weight:      Height:        Intake/Output Summary (Last 24 hours) at 03/22/2022 0855 Last data filed at 03/21/2022 1500 Gross per 24 hour  Intake 816 ml  Output --  Net 816 ml   Filed Weights   03/18/22 0121 03/18/22 1258  Weight: 56 kg 57.8 kg    Examination:  Physical Exam: GEN: NAD, alert and oriented x 3, elderly in appearance HEENT: NCAT, PERRL, EOMI, sclera clear, MMM PULM: CTAB w/o wheezes/crackles, normal respiratory effort, on room air CV: RRR w/o M/G/R GI: abd soft, NTND, NABS, no R/G/M MSK: no peripheral edema, moves all extremities independently, sensation to light touch intact, neurovascularlly intact, surgical site noted with dressings in place, clean/dry/intact PSYCH: normal mood/affect Integumentary: dry/intact, no rashes or wounds    Data Reviewed: I have personally reviewed following labs and imaging studies  CBC: Recent Labs  Lab 03/18/22 0040 03/19/22 0057 03/19/22 1235 03/20/22 0205 03/20/22 1404 03/21/22 0240 03/21/22 1525 03/22/22 0143  WBC 11.1* 6.2  --  11.2*  --  7.0  --  9.0  NEUTROABS 8.7*  --   --   --   --   --   --   --   HGB 10.4* 7.1*   < > 7.6* 8.4* 7.0* 8.6* 8.4*  HCT 30.1* 20.8*   < > 21.6* 24.3* 20.8* 24.6* 24.7*  MCV 95.6 95.9  --  91.1  --  94.1  --  93.9  PLT 155 106*  --  97*  --  89*  --  109*   < > = values in this interval not displayed.   Basic Metabolic Panel: Recent Labs  Lab 03/18/22 0040 03/19/22 0057 03/20/22 0205 03/21/22 0240 03/22/22 0143  NA 139 132* 131* 135 136  K 4.3 4.2 4.5 4.2 4.7  CL 104 103 99 103 107  CO2 24 19* 22 24 19*  GLUCOSE 68* 310* 149* 95 107*  BUN 58* 63* 88* 95* 97*  CREATININE 2.43* 2.84* 3.39* 3.57* 2.87*  CALCIUM 9.0 8.0* 7.7* 7.4* 7.4*   GFR: Estimated Creatinine Clearance: 12 mL/min (A) (by C-G formula based on SCr of 2.87 mg/dL (H)). Liver Function Tests: No results for input(s): AST, ALT, ALKPHOS, BILITOT, PROT, ALBUMIN in the last 168 hours. No results for input(s): LIPASE, AMYLASE in the last 168 hours. No results for input(s): AMMONIA in the last 168 hours. Coagulation Profile: No results for input(s): INR, PROTIME in the last 168 hours. Cardiac Enzymes: No results for input(s): CKTOTAL, CKMB, CKMBINDEX, TROPONINI in the last 168 hours. BNP (last 3 results) No results for input(s): PROBNP in the last 8760 hours. HbA1C: No results for input(s): HGBA1C in the last 72 hours. CBG: Recent Labs  Lab 03/21/22 0745 03/21/22 1204 03/21/22 1640 03/21/22 2125 03/22/22 0730  GLUCAP 117* 162* 138* 144* 72   Lipid Profile: No results for input(s): CHOL, HDL, LDLCALC, TRIG, CHOLHDL, LDLDIRECT in the last 72 hours. Thyroid Function Tests: No results for input(s): TSH, T4TOTAL, FREET4, T3FREE, THYROIDAB in the last 72 hours. Anemia Panel: Recent Labs    03/21/22 0240  VITAMINB12 261  FOLATE 10.6  FERRITIN 271  TIBC 183*  IRON 31  RETICCTPCT 1.8   Sepsis Labs: No results for input(s): PROCALCITON, LATICACIDVEN in the last 168 hours.  Recent Results (from the past 240 hour(s))   Surgical pcr screen     Status: None   Collection Time: 03/18/22  1:14 PM   Specimen: Nasal Mucosa; Nasal Swab  Result Value Ref Range Status   MRSA, PCR NEGATIVE NEGATIVE Final   Staphylococcus aureus NEGATIVE NEGATIVE Final    Comment: (NOTE) The Xpert SA Assay (FDA approved for NASAL specimens in patients 29 years of age and older), is one component of a comprehensive surveillance program. It is not intended to diagnose infection nor to guide or monitor treatment. Performed at [facility], [place] [address] [place], [place] [address]          Radiology Studies: US RENAL  Result Date: 03/21/2022 CLINICAL DATA:  Chronic renal failure EXAM: RENAL / URINARY TRACT ULTRASOUND COMPLETE COMPARISON:  None Available. FINDINGS: Right Kidney: Renal measurements: 10.3 x 5.1 x 4.3 cm = volume: 118.2 mL. Thinning and increased echogenicity of the renal cortex. No mass or hydronephrosis visualized. Anechoic avascular cystic structure in the upper pole measuring 0.5 x 0.8 x 0.4 cm. Left Kidney: Renal measurements: 8.7 x 4.9 x 5.0 cm = volume: 12.5 mL. Thinning of the cortex with increased echogenicity. No mass or hydronephrosis visualized. Exophytic Anechoic avascular cyst in the interpolar region measuring  approximately 1.6 x 1.6 x 1.8 cm. Bladder: Appears normal for degree of bladder distention. Ureteral jets were not seen. Other: None. IMPRESSION: 1. No evidence of nephrolithiasis or hydronephrosis. Thinning and increased echogenicity of the renal cortices concerning for medical renal disease. 2.  Urinary bladder is unremarkable. Electronically Signed   By: Keane Police D.O.   On: 03/21/2022 10:10        Scheduled Meds:  amLODipine  5 mg Oral Daily   aspirin EC  81 mg Oral Daily   carvedilol  25 mg Oral BID   Chlorhexidine Gluconate Cloth  6 each Topical Daily   enoxaparin (LOVENOX) injection  30 mg Subcutaneous Q24H   feeding supplement  1 Container Oral BID BM   ferrous sulfate  325  mg Oral BID WC   gabapentin  300 mg Oral QHS   insulin aspart  0-15 Units Subcutaneous TID WC   insulin glargine-yfgn  25 Units Subcutaneous QHS   isosorbide mononitrate  30 mg Oral Daily   levothyroxine  100 mcg Oral QHS   multivitamin with minerals  1 tablet Oral Daily   pantoprazole  40 mg Oral QAC breakfast   polyethylene glycol  17 g Oral Daily   rosuvastatin  10 mg Oral QHS   senna-docusate  2 tablet Oral BID   sertraline  25 mg Oral Daily   Continuous Infusions:  sodium chloride 75 mL/hr at 03/22/22 0156       LOS: 4 days    Time spent: 49 minutes spent on chart review, discussion with nursing staff, consultants, updating family and interview/physical exam; more than 50% of that time was spent in counseling and/or coordination of care.    Rebecah Dangerfield J [place] DO [facility] Available via Epic secure chat 7am-7pm After these hours, please refer to coverage provider listed on [email or web address] 03/22/2022, 8:55 AM

## 2022-03-22 NOTE — TOC Progression Note (Signed)
Transition of Care [facility]) - Progression Note    Patient Details  Name: [name] MRN: [id number] Date of Birth: [birth year]  Transition of Care [facility]) CM/SW Contact  Joanne Chars, LCSW Phone Number: 03/22/2022, 1:42 PM  Clinical Narrative:   Bed offers presented to pt and daughter [name], they want to accept offer from [place].  Auth request submitted in [place].     Expected Discharge Plan: Skilled Nursing Facility Barriers to Discharge: Ship broker, Continued Medical Work up, SNF Pending bed offer  Expected Discharge Plan and Services Expected Discharge Plan: [place] Choice: [place] arrangements for the past 2 months: Single Family Home                                       Social Determinants of Health (SDOH) Interventions    Readmission Risk Interventions    02/11/2022    4:50 PM  Readmission Risk Prevention Plan  Transportation Screening Complete  PCP or Specialist Appt within 3-5 Days Complete  HRI or [place] Complete  Social Work Consult for [place] Planning/Counseling Complete  Palliative Care Screening Not Applicable  Medication Review Press photographer) Complete

## 2022-03-23 DIAGNOSIS — N390 Urinary tract infection, site not specified: Secondary | ICD-10-CM | POA: Diagnosis not present

## 2022-03-23 DIAGNOSIS — E440 Moderate protein-calorie malnutrition: Secondary | ICD-10-CM | POA: Diagnosis not present

## 2022-03-23 DIAGNOSIS — E039 Hypothyroidism, unspecified: Secondary | ICD-10-CM | POA: Diagnosis not present

## 2022-03-23 DIAGNOSIS — E1159 Type 2 diabetes mellitus with other circulatory complications: Secondary | ICD-10-CM | POA: Diagnosis not present

## 2022-03-23 DIAGNOSIS — M80052A Age-related osteoporosis with current pathological fracture, left femur, initial encounter for fracture: Secondary | ICD-10-CM | POA: Diagnosis not present

## 2022-03-23 DIAGNOSIS — S72142A Displaced intertrochanteric fracture of left femur, initial encounter for closed fracture: Secondary | ICD-10-CM | POA: Diagnosis not present

## 2022-03-23 DIAGNOSIS — M6281 Muscle weakness (generalized): Secondary | ICD-10-CM | POA: Diagnosis not present

## 2022-03-23 DIAGNOSIS — R2689 Other abnormalities of gait and mobility: Secondary | ICD-10-CM | POA: Diagnosis not present

## 2022-03-23 DIAGNOSIS — R6889 Other general symptoms and signs: Secondary | ICD-10-CM | POA: Diagnosis not present

## 2022-03-23 DIAGNOSIS — G8918 Other acute postprocedural pain: Secondary | ICD-10-CM | POA: Diagnosis not present

## 2022-03-23 DIAGNOSIS — R279 Unspecified lack of coordination: Secondary | ICD-10-CM | POA: Diagnosis not present

## 2022-03-23 DIAGNOSIS — I495 Sick sinus syndrome: Secondary | ICD-10-CM | POA: Diagnosis not present

## 2022-03-23 DIAGNOSIS — N184 Chronic kidney disease, stage 4 (severe): Secondary | ICD-10-CM | POA: Diagnosis not present

## 2022-03-23 DIAGNOSIS — E871 Hypo-osmolality and hyponatremia: Secondary | ICD-10-CM | POA: Diagnosis not present

## 2022-03-23 DIAGNOSIS — R109 Unspecified abdominal pain: Secondary | ICD-10-CM | POA: Diagnosis not present

## 2022-03-23 DIAGNOSIS — I10 Essential (primary) hypertension: Secondary | ICD-10-CM | POA: Diagnosis not present

## 2022-03-23 DIAGNOSIS — R062 Wheezing: Secondary | ICD-10-CM | POA: Diagnosis not present

## 2022-03-23 DIAGNOSIS — R110 Nausea: Secondary | ICD-10-CM | POA: Diagnosis not present

## 2022-03-23 DIAGNOSIS — Z79899 Other long term (current) drug therapy: Secondary | ICD-10-CM | POA: Diagnosis not present

## 2022-03-23 DIAGNOSIS — I509 Heart failure, unspecified: Secondary | ICD-10-CM | POA: Diagnosis not present

## 2022-03-23 DIAGNOSIS — D62 Acute posthemorrhagic anemia: Secondary | ICD-10-CM | POA: Diagnosis not present

## 2022-03-23 DIAGNOSIS — R262 Difficulty in walking, not elsewhere classified: Secondary | ICD-10-CM | POA: Diagnosis not present

## 2022-03-23 DIAGNOSIS — Z743 Need for continuous supervision: Secondary | ICD-10-CM | POA: Diagnosis not present

## 2022-03-23 DIAGNOSIS — Z950 Presence of cardiac pacemaker: Secondary | ICD-10-CM | POA: Diagnosis not present

## 2022-03-23 DIAGNOSIS — D649 Anemia, unspecified: Secondary | ICD-10-CM | POA: Diagnosis not present

## 2022-03-23 DIAGNOSIS — E1165 Type 2 diabetes mellitus with hyperglycemia: Secondary | ICD-10-CM | POA: Diagnosis not present

## 2022-03-23 DIAGNOSIS — I5032 Chronic diastolic (congestive) heart failure: Secondary | ICD-10-CM | POA: Diagnosis not present

## 2022-03-23 DIAGNOSIS — S72142D Displaced intertrochanteric fracture of left femur, subsequent encounter for closed fracture with routine healing: Secondary | ICD-10-CM | POA: Diagnosis not present

## 2022-03-23 DIAGNOSIS — E785 Hyperlipidemia, unspecified: Secondary | ICD-10-CM | POA: Diagnosis not present

## 2022-03-23 DIAGNOSIS — I480 Paroxysmal atrial fibrillation: Secondary | ICD-10-CM | POA: Diagnosis not present

## 2022-03-23 DIAGNOSIS — N179 Acute kidney failure, unspecified: Secondary | ICD-10-CM | POA: Diagnosis not present

## 2022-03-23 DIAGNOSIS — K219 Gastro-esophageal reflux disease without esophagitis: Secondary | ICD-10-CM | POA: Diagnosis not present

## 2022-03-23 DIAGNOSIS — K2100 Gastro-esophageal reflux disease with esophagitis, without bleeding: Secondary | ICD-10-CM | POA: Diagnosis not present

## 2022-03-23 DIAGNOSIS — Z794 Long term (current) use of insulin: Secondary | ICD-10-CM | POA: Diagnosis not present

## 2022-03-23 DIAGNOSIS — S72002D Fracture of unspecified part of neck of left femur, subsequent encounter for closed fracture with routine healing: Secondary | ICD-10-CM | POA: Diagnosis not present

## 2022-03-23 DIAGNOSIS — E1042 Type 1 diabetes mellitus with diabetic polyneuropathy: Secondary | ICD-10-CM | POA: Diagnosis not present

## 2022-03-23 DIAGNOSIS — W19XXXD Unspecified fall, subsequent encounter: Secondary | ICD-10-CM | POA: Diagnosis not present

## 2022-03-23 DIAGNOSIS — R609 Edema, unspecified: Secondary | ICD-10-CM | POA: Diagnosis not present

## 2022-03-23 DIAGNOSIS — Z66 Do not resuscitate: Secondary | ICD-10-CM | POA: Diagnosis not present

## 2022-03-23 LAB — GLUCOSE, CAPILLARY
Glucose-Capillary: 49 mg/dL — ABNORMAL LOW (ref 70–99)
Glucose-Capillary: 66 mg/dL — ABNORMAL LOW (ref 70–99)
Glucose-Capillary: 76 mg/dL (ref 70–99)
Glucose-Capillary: 87 mg/dL (ref 70–99)
Glucose-Capillary: 129 mg/dL — ABNORMAL HIGH (ref 70–99)
Glucose-Capillary: 142 mg/dL — ABNORMAL HIGH (ref 70–99)

## 2022-03-23 LAB — CBC
HCT: 24.3 % — ABNORMAL LOW (ref 36.0–46.0)
Hemoglobin: 8.3 g/dL — ABNORMAL LOW (ref 12.0–15.0)
MCH: 32.3 pg (ref 26.0–34.0)
MCHC: 34.2 g/dL (ref 30.0–36.0)
MCV: 94.6 fL (ref 80.0–100.0)
Platelets: 123 10*3/uL — ABNORMAL LOW (ref 150–400)
RBC: 2.57 MIL/uL — ABNORMAL LOW (ref 3.87–5.11)
RDW: 14.6 % (ref 11.5–15.5)
WBC: 7.2 10*3/uL (ref 4.0–10.5)
nRBC: 0 % (ref 0.0–0.2)

## 2022-03-23 LAB — BASIC METABOLIC PANEL
Anion gap: 8 (ref 5–15)
BUN: 84 mg/dL — ABNORMAL HIGH (ref 8–23)
CO2: 17 mmol/L — ABNORMAL LOW (ref 22–32)
Calcium: 7.9 mg/dL — ABNORMAL LOW (ref 8.9–10.3)
Chloride: 110 mmol/L (ref 98–111)
Creatinine, Ser: 2.2 mg/dL — ABNORMAL HIGH (ref 0.44–1.00)
GFR, Estimated: 22 mL/min — ABNORMAL LOW (ref >60)
Glucose, Bld: 62 mg/dL — ABNORMAL LOW (ref 70–99)
Potassium: 4.1 mmol/L (ref 3.5–5.1)
Sodium: 135 mmol/L (ref 135–145)

## 2022-03-23 MED ORDER — INSULIN GLARGINE 100 UNIT/ML ~~LOC~~ SOLN: 15.0000 [IU] | Freq: Every day | SUBCUTANEOUS | 0 refills | Status: AC

## 2022-03-23 MED ORDER — INSULIN GLARGINE-YFGN 100 UNIT/ML ~~LOC~~ SOLN
15.0000 [IU] | Freq: Every day | SUBCUTANEOUS | Status: DC
  Filled 2022-03-23: qty 0.15

## 2022-03-23 MED ORDER — FERROUS SULFATE 325 (65 FE) MG PO TABS: 325.0000 mg | ORAL_TABLET | Freq: Two times a day (BID) | ORAL | 3 refills | Status: DC

## 2022-03-23 NOTE — Progress Notes (Signed)
Patient's BS dropped to 49 during the night. Pt was arousable, alert and was able to drink orange juice. Sugar improved to 84.

## 2022-03-23 NOTE — Discharge Summary (Signed)
Physician Discharge Summary  [name] IRJ:[id number] DOB: 1941-07-07 DOA: 03/17/2022  PCP: Leeroy Cha, MD  Admit date: 03/17/2022 Discharge date: 03/23/2022  Admitted From: Home Disposition: [facility]  Recommendations for Outpatient Follow-up:  Follow up with PCP in 1-2 weeks Follow-up with orthopedics, Dr. Mable Fill in 2 weeks for wound check Please obtain BMP/CBC in one week to assess renal function and hemoglobin level Discontinued home torsemide due to acute on chronic renal failure with poor oral intake.  Continue to monitor volume status, daily weights prior to reinitiation. Decrease dose of home Lantus from 20 units to 15 units nightly due to poor oral intake and some mild hypoglycemia during hospitalization  Discharge Condition: Stable CODE STATUS: DNR Diet recommendation: Heart healthy/consistent carbohydrate diet  History of present illness:  [name] is a 81 y.o. female with past medical history significant for paroxysmal atrial fibrillation, SSS s/p PPM, CAD s/p CABG x3, type 2 diabetes mellitus, hypothyroidism, hyperlipidemia, chronic diastolic congestive heart failure, essential hypertension, CKD stage IV hyperlipidemia history of GI bleed who presented to [facility] ED on 5/24 following mechanical fall at home.  Patient states was getting ready for bed, dropped an item on the ground and when she tried to pick it up she fell onto her right side.  She reported immediate pain to her left hip and was brought to the ED via EMS for further evaluation.   In the ED, temperature 97.9 degrees Fahrenheit, HR 60, RR 16, BP 168/63, SPO2 100% on room air.  Sodium 139, potassium 4.3, chloride 104, CO2 24, glucose 68, BUN 58, creatinine 2.43.  WBC 11.1, hemoglobin 10.4, platelets 155.  Urinalysis unrevealing.  CT head and C-spine with no acute intracranial injury, no calvarial fracture, no acute fracture or listhesis of the C-spine.  Pelvis/left femur with comminuted angulated  intratrochanteric fracture of the proximal left femur.  Left humerus x-ray with bony deformity of the left humeral head/neck, unchanged from previous exam and likely related to old trauma, no acute fractures identified.  Orthopedics was consulted.  Hospital service consulted for further evaluation and management for acute left hip fracture.   Hospital course:  Left hip fracture Patient presenting to ED via EMS following mechanical fall at home without loss of consciousness.  Imaging left pelvis/hip with comminuted angulated intratrochanteric fracture of the proximal left femur.  Orthopedics was consulted and patient underwent left intratrochanteric intramedullary nail by Dr. Mable Fill on 03/19/2022.  Continue Norco as needed for pain control.  DVT prophylaxis with Lovenox x 6 weeks postoperatively per orthopedics. Outpatient follow-up with [facility] 2 weeks postop   Acute postoperative blood loss anemia Postoperatively patient's hemoglobin trended down from 10.4-7.1 and was transfused 2 unit PRBCs during hospitalization.  Patient start ferrous sulfate 325 milligrams p.o. twice daily.  Hemoglobin remained stable and was 8.3 at time of discharge.  Recommend repeat CBC 1 week.   [name] on CKD stage IV Follows with [facility], Dr. Moshe Cipro outpatient. Creatinine baseline 2.2-2.4.  Renal ultrasound with no evidence of nephrolithiasis or hydronephrosis, thinning of the renal cortices and increased echogenicity consistent with medical renal disease, urinary bladder unremarkable.  Etiology likely secondary to poor oral intake in the setting of home torsemide use.  Torsemide was discontinued and patient was supported with IV fluid hydration with improvement of creatinine from a peak of 3.57 to 2.20 at time of discharge.  We will continue to hold home torsemide.  Monitor volume status/daily weights.  Repeat BMP 1 week.   Hyponatremia  Etiology likely secondary to hypovolemic hyponatremia  in the setting of poor oral intake/dehydration.  During hospitalization, patient's sodium trended down to 131, likely secondary to diuretic use with torsemide as well as poor oral intake.  Supported with IV fluid hydration and discontinuation of torsemide.  Sodium improved to 135 at time of discharge.   Chronic diastolic congestive heart failure, compensated Follows with cardiology, Dr. Debara Pickett outpatient.  TTE 01/06/2022 LVEF 55-60%, LV normal function, LV has no regional wall motion normalities, mild LVH, grade 2 diastolic dysfunction, LA mildly dilated, moderate TR, mild MR, no aortic stenosis, IVC normal in size.  Continue carvedilol 25 mg p.o. twice daily, Imdur 30 mg p.o. daily. Holding home torsemide at discharge due to renal failure as above and poor oral intake.  Recommend monitoring daily weights before reinitiation   SSS s/p PPM Followed by electrophysiology, Dr. Curt Bears outpatient.   Paroxysmal atrial flutter/fibrillation No longer on anticoagulation due to prior history of GI bleed.   Type 2 diabetes mellitus, with hyperglycemia Home regimen includes Lantus 20 units subcutaneously daily; which was reduced to 15 units subcutaneously daily at time of discharge due to borderline hypoglycemic events during hospitalization in the setting of poor oral intake.  Continue to monitor glucose closely and adjust as needed.   Peripheral neuropathy Gabapentin 300 mg PO qHS   CAD Prior history of three-vessel CABG 2013. Continue aspirin, statin, beta-blocker, Imdur   Essential hypertension Home medication regimen includes carvedilol 25 mg p.o. daily, amlodipine 5 mg p.o. daily, isosorbide mononitrate 30 mg p.o. daily, torsemide 40 mg p.o. daily. Continue carvedilol, amlodipine, isosorbide mononitrate. Holding home torsemide as above   Thrombocytopenia, chronic Platelet count 155>106>97>89>123 at time of discharge.  Repeat CBC 1 week for further monitoring given patient on Lovenox and aspirin.    Hyperlipidemia Crestor 10 mg p.o. daily   Hypothyroidism TSH 0.135 and free T4 1.50 on 02/11/2022. Levothyroxine 100 mcg p.o. daily   Depression/anxiety Sertraline 25 mg p.o. daily   Moderate protein calorie malnutrition Nutrition Status: Nutrition Problem: Moderate Malnutrition Etiology: chronic illness (CHF, CKD) Signs/Symptoms: moderate muscle depletion, moderate fat depletion Interventions: Ensure Enlive (each supplement provides 350kcal and 20 grams of protein), MVI Dietitian following, continue to encourage increased oral intake, supplementation  Discharge Diagnoses:  Principal Problem:   Closed intertrochanteric fracture of left hip ([place]) Active Problems:   GERD   Insulin dependent type 2 diabetes mellitus ([place])   Hyperlipidemia associated with type 2 diabetes mellitus (HCC)   S/P CABG (coronary artery bypass graft), 12/04/11   Hypertension associated with diabetes ([place])   Diabetic peripheral neuropathy associated with type 1 diabetes mellitus (HCC)   Hypothyroidism   CKD (chronic kidney disease) stage 4, GFR 15-29 ml/min (HCC)   Chronic diastolic CHF (congestive heart failure) (HCC)   S/P placement of cardiac pacemaker   SSS (sick sinus syndrome) (HCC)   Paroxysmal atrial fibrillation (HCC) - not on systemic anticoagulation due to hx of GI bleeding   Anemia in chronic kidney disease   Cardiac pacemaker in situ   DNR (do not resuscitate)/DNI(Do Not Intubate)   Acute renal failure superimposed on stage 4 chronic kidney disease (HCC)   Acute postoperative anemia due to expected blood loss   Malnutrition of moderate degree   Hyponatremia    Discharge Instructions  Discharge Instructions     Call MD for:  difficulty breathing, headache or visual disturbances   Complete by: As directed    Call MD for:  extreme fatigue   Complete by:  As directed    Call MD for:  persistant dizziness or light-headedness   Complete by: As directed    Call MD for:  persistant nausea  and vomiting   Complete by: As directed    Call MD for:  redness, tenderness, or signs of infection (pain, swelling, redness, odor or green/yellow discharge around incision site)   Complete by: As directed    Call MD for:  severe uncontrolled pain   Complete by: As directed    Call MD for:  temperature >100.4   Complete by: As directed    Diet - low sodium heart healthy   Complete by: As directed    Increase activity slowly   Complete by: As directed    No wound care   Complete by: As directed       Allergies as of 03/23/2022       Reactions   Clonidine Derivatives Other (See Comments)   Bradycardia and fatigue   Clonidine Hcl Other (See Comments)   Bradycardia   Crestor [rosuvastatin] Other (See Comments)   Made the patient feel tired/weak   Losartan Potassium Other (See Comments)   Hyperkalemia   Sulfa Antibiotics Other (See Comments)   Childhood reaction not recalled   Epinephrine Other (See Comments)   Abnormal feeling. Dental exam/injection of local w/ epi.   Hydralazine Hcl Anxiety, Other (See Comments)   Nervousness, anxiousness, GI upset        Medication List     STOP taking these medications    torsemide 20 MG tablet Commonly known as: DEMADEX       TAKE these medications    acetaminophen 650 MG CR tablet Commonly known as: TYLENOL Take 650 mg by mouth every 8 (eight) hours as needed for pain.   allopurinol 100 MG tablet Commonly known as: ZYLOPRIM Take 200 mg by mouth daily.   amLODipine 5 MG tablet Commonly known as: NORVASC Take 1 tablet (5 mg total) by mouth daily.   aspirin EC 81 MG tablet Take 1 tablet (81 mg total) by mouth daily.   carvedilol 25 MG tablet Commonly known as: COREG Take 1 tablet (25 mg total) by mouth 2 (two) times daily.   diclofenac Sodium 1 % Gel Commonly known as: VOLTAREN Apply 2 g topically 2 (two) times daily as needed (pain).   docusate sodium 100 MG capsule Commonly known as: COLACE Take 100 mg by  mouth in the morning and at bedtime.   enoxaparin 30 MG/0.3ML injection Commonly known as: LOVENOX Inject 0.3 mLs (30 mg total) into the skin daily.   ferrous sulfate 325 (65 FE) MG tablet Take 1 tablet (325 mg total) by mouth 2 (two) times daily with a meal. What changed:  medication strength when to take this additional instructions   gabapentin 300 MG capsule Commonly known as: NEURONTIN Take 1 capsule (300 mg total) by mouth at bedtime.   HYDROcodone-acetaminophen 5-325 MG tablet Commonly known as: NORCO/VICODIN Take 1-2 tablets by mouth every 4 (four) hours as needed for moderate pain (pain score 4-6).   insulin glargine 100 UNIT/ML injection Commonly known as: Lantus Inject 0.15 mLs (15 Units total) into the skin daily. What changed: how much to take   isosorbide mononitrate 30 MG 24 hr tablet Commonly known as: IMDUR Take 1 tablet (30 mg total) by mouth daily.   meclizine 25 MG tablet Commonly known as: ANTIVERT Take 25 mg by mouth daily as needed for dizziness.   nitroGLYCERIN 0.4 MG SL tablet  Commonly known as: NITROSTAT Place 1 tablet (0.4 mg total) under the tongue every 5 (five) minutes as needed for chest pain (max 3 doses).   pantoprazole 40 MG tablet Commonly known as: PROTONIX Take 40 mg by mouth daily before breakfast.   polyethylene glycol 17 g packet Commonly known as: MIRALAX / GLYCOLAX Take 17 g by mouth daily. What changed:  when to take this reasons to take this   rosuvastatin 10 MG tablet Commonly known as: CRESTOR Take 10 mg by mouth at bedtime.   sertraline 25 MG tablet Commonly known as: ZOLOFT Take 25 mg by mouth daily.   Synthroid 100 MCG tablet Generic drug: levothyroxine Take 100 mcg by mouth at bedtime.   SYSTANE OP Place 1 drop into both eyes 2 (two) times daily as needed (dry eyes).        Follow-up Information     Georgeanna Harrison, MD. Schedule an appointment as soon as possible for a visit in 2 week(s).    Specialty: Orthopedic Surgery Contact information: [address] [place] [address] [phone]         Leeroy Cha, MD. Schedule an appointment as soon as possible for a visit in 1 week(s).   Specialty: Internal Medicine Contact information: [address] [place] [address] [phone]         Constance Haw, MD .   Specialty: Cardiology Contact information: [place] [address] [phone]         [name], MD .   Specialty: Cardiology Contact information: [address] [place] [address] [place] [address] [phone]                Allergies  Allergen Reactions   Clonidine Derivatives Other (See Comments)    Bradycardia and fatigue    Clonidine Hcl Other (See Comments)    Bradycardia   Crestor [Rosuvastatin] Other (See Comments)    Made the patient feel tired/weak   Losartan Potassium Other (See Comments)    Hyperkalemia   Sulfa Antibiotics Other (See Comments)    Childhood reaction not recalled   Epinephrine Other (See Comments)    Abnormal feeling. Dental exam/injection of local w/ epi.   Hydralazine Hcl Anxiety and Other (See Comments)    Nervousness, anxiousness, GI upset    Consultations: Orthopedics, Dr. Mable Fill   Procedures/Studies: CT Head Wo Contrast  Result Date: 03/18/2022 CLINICAL DATA:  Polytrauma, blunt.  Fall, head injury EXAM: CT HEAD WITHOUT CONTRAST CT CERVICAL SPINE WITHOUT CONTRAST TECHNIQUE: Multidetector CT imaging of the head and cervical spine was performed following the standard protocol without intravenous contrast. Multiplanar CT image reconstructions of the cervical spine were also generated. RADIATION DOSE REDUCTION: This exam was performed according to the departmental dose-optimization program which includes automated exposure control, adjustment of the mA and/or kV according to patient size and/or use of iterative reconstruction technique.  COMPARISON:  None Available. FINDINGS: CT HEAD FINDINGS Brain: Normal anatomic configuration. Parenchymal volume loss is commensurate with the patient's age. Mild periventricular white matter changes are present likely reflecting the sequela of small vessel ischemia. No abnormal intra or extra-axial mass lesion or fluid collection. No abnormal mass effect or midline shift. No evidence of acute intracranial hemorrhage or infarct. Ventricular size is normal. Cerebellum unremarkable. Vascular: No asymmetric hyperdense vasculature at the skull base. Skull: Intact Sinuses/Orbits: Paranasal sinuses are clear. Orbits are unremarkable. Other: Mastoid air cells and middle ear cavities are clear. CT CERVICAL SPINE  FINDINGS Alignment: Normal. Skull base and vertebrae: Craniocervical alignment is normal. Atlantodental interval is not widened. No acute fracture of the cervical spine. Vertebral body height is preserved. Soft tissues and spinal canal: No canal hematoma. No prevertebral soft tissue swelling or paravertebral fluid collection. Extensive atherosclerotic calcification within the carotid bifurcations. No pathologic cervical adenopathy. Disc levels: Intervertebral disc space narrowing and endplate remodeling at H2-9 in keeping with changes of advanced degenerative disc disease. Mild degenerative changes are seen throughout the remainder of the cervical spine. Prevertebral soft tissues are not thickened on sagittal reformats. The spinal canal is widely patent. No significant neuroforaminal narrowing. Upper chest: Unremarkable Other: None IMPRESSION: No acute intracranial injury.  No calvarial fracture. No acute fracture or listhesis of the cervical spine. Peripheral vascular disease with extensive atherosclerotic calcification within the carotid bifurcations. Electronically Signed   By: Fidela Salisbury M.D.   On: 03/18/2022 01:59   CT Cervical Spine Wo Contrast  Result Date: 03/18/2022 CLINICAL DATA:  Polytrauma,  blunt.  Fall, head injury EXAM: CT HEAD WITHOUT CONTRAST CT CERVICAL SPINE WITHOUT CONTRAST TECHNIQUE: Multidetector CT imaging of the head and cervical spine was performed following the standard protocol without intravenous contrast. Multiplanar CT image reconstructions of the cervical spine were also generated. RADIATION DOSE REDUCTION: This exam was performed according to the departmental dose-optimization program which includes automated exposure control, adjustment of the mA and/or kV according to patient size and/or use of iterative reconstruction technique. COMPARISON:  None Available. FINDINGS: CT HEAD FINDINGS Brain: Normal anatomic configuration. Parenchymal volume loss is commensurate with the patient's age. Mild periventricular white matter changes are present likely reflecting the sequela of small vessel ischemia. No abnormal intra or extra-axial mass lesion or fluid collection. No abnormal mass effect or midline shift. No evidence of acute intracranial hemorrhage or infarct. Ventricular size is normal. Cerebellum unremarkable. Vascular: No asymmetric hyperdense vasculature at the skull base. Skull: Intact Sinuses/Orbits: Paranasal sinuses are clear. Orbits are unremarkable. Other: Mastoid air cells and middle ear cavities are clear. CT CERVICAL SPINE FINDINGS Alignment: Normal. Skull base and vertebrae: Craniocervical alignment is normal. Atlantodental interval is not widened. No acute fracture of the cervical spine. Vertebral body height is preserved. Soft tissues and spinal canal: No canal hematoma. No prevertebral soft tissue swelling or paravertebral fluid collection. Extensive atherosclerotic calcification within the carotid bifurcations. No pathologic cervical adenopathy. Disc levels: Intervertebral disc space narrowing and endplate remodeling at J2-4 in keeping with changes of advanced degenerative disc disease. Mild degenerative changes are seen throughout the remainder of the cervical spine.  Prevertebral soft tissues are not thickened on sagittal reformats. The spinal canal is widely patent. No significant neuroforaminal narrowing. Upper chest: Unremarkable Other: None IMPRESSION: No acute intracranial injury.  No calvarial fracture. No acute fracture or listhesis of the cervical spine. Peripheral vascular disease with extensive atherosclerotic calcification within the carotid bifurcations. Electronically Signed   By: Fidela Salisbury M.D.   On: 03/18/2022 01:59   US RENAL  Result Date: 03/21/2022 CLINICAL DATA:  Chronic renal failure EXAM: RENAL / URINARY TRACT ULTRASOUND COMPLETE COMPARISON:  None Available. FINDINGS: Right Kidney: Renal measurements: 10.3 x 5.1 x 4.3 cm = volume: 118.2 mL. Thinning and increased echogenicity of the renal cortex. No mass or hydronephrosis visualized. Anechoic avascular cystic structure in the upper pole measuring 0.5 x 0.8 x 0.4 cm. Left Kidney: Renal measurements: 8.7 x 4.9 x 5.0 cm = volume: 12.5 mL. Thinning of the cortex with increased echogenicity. No mass or hydronephrosis visualized. Exophytic  Anechoic avascular cyst in the interpolar region measuring approximately 1.6 x 1.6 x 1.8 cm. Bladder: Appears normal for degree of bladder distention. Ureteral jets were not seen. Other: None. IMPRESSION: 1. No evidence of nephrolithiasis or hydronephrosis. Thinning and increased echogenicity of the renal cortices concerning for medical renal disease. 2.  Urinary bladder is unremarkable. Electronically Signed   By: Keane Police D.O.   On: 03/21/2022 10:10   DG Humerus Left  Result Date: 03/18/2022 CLINICAL DATA:  Fall with deformity. EXAM: LEFT HUMERUS - 2+ VIEW COMPARISON:  11/23/2016, 02/10/2022. FINDINGS: There is deformity of the left humeral head/neck which is improved from the prior exam and likely related to old trauma. No acute fracture or dislocation is seen. Degenerative changes are present at the acromioclavicular joint. IMPRESSION: Bony deformity of the  left humeral head/neck, unchanged from the previous exam and likely related to old trauma. No acute fracture is identified. Electronically Signed   By: Brett Fairy M.D.   On: 03/18/2022 00:43   DG Hip Unilat With Pelvis 2-3 Views Left  Result Date: 03/18/2022 CLINICAL DATA:  Fall, with deformity. EXAM: LEFT FEMUR 1 VIEW; DG HIP (WITH OR WITHOUT PELVIS) 2-3V LEFT COMPARISON:  05/17/2019. FINDINGS: There is a comminuted intertrochanteric fracture of the left hip with mild lateral angulation and superior subluxation of the distal fracture fragment. No dislocation is seen. The remaining bony structures appear intact. Total knee arthroplasty changes are noted on the left without evidence of hardware loosening. Mild degenerative changes are noted in the lower lumbar spine. Vascular calcifications are noted in the pelvis and lower extremities bilaterally. IMPRESSION: Comminuted angulated intratrochanteric fracture of the proximal left femur. Electronically Signed   By: Brett Fairy M.D.   On: 03/18/2022 00:39   DG Femur 1V Left  Result Date: 03/18/2022 CLINICAL DATA:  Fall, with deformity. EXAM: LEFT FEMUR 1 VIEW; DG HIP (WITH OR WITHOUT PELVIS) 2-3V LEFT COMPARISON:  05/17/2019. FINDINGS: There is a comminuted intertrochanteric fracture of the left hip with mild lateral angulation and superior subluxation of the distal fracture fragment. No dislocation is seen. The remaining bony structures appear intact. Total knee arthroplasty changes are noted on the left without evidence of hardware loosening. Mild degenerative changes are noted in the lower lumbar spine. Vascular calcifications are noted in the pelvis and lower extremities bilaterally. IMPRESSION: Comminuted angulated intratrochanteric fracture of the proximal left femur. Electronically Signed   By: Brett Fairy M.D.   On: 03/18/2022 00:39   DG FEMUR MIN 2 VIEWS LEFT  Result Date: 03/18/2022 CLINICAL DATA:  Postop. EXAM: LEFT FEMUR 2 VIEWS COMPARISON:   03/18/2022. FINDINGS: There is redemonstration of a comminuted intertrochanteric fracture of the proximal left femur with interval placement gamma nail and intramedullary rod. Fracture fragment alignment is improved. Small amount of air is noted in the soft tissues of the left hip. Vascular calcifications are noted in the left lower extremity. IMPRESSION: Status post open reduction internal fixation of intertrochanteric fracture of the proximal left femur. Electronically Signed   By: Brett Fairy M.D.   On: 03/18/2022 21:24   DG FEMUR MIN 2 VIEWS LEFT  Result Date: 03/18/2022 CLINICAL DATA:  Left femur ORIF. EXAM: LEFT FEMUR 2 VIEWS COMPARISON:  Left hip radiographs 03/18/2022 FLUOROSCOPY: Fluoroscopy Time: 59 seconds Radiation Exposure Index: 16.24 mGy FINDINGS: Six intraoperative spot fluoroscopic images are provided during ORIF of the previously described inter trochanteric fracture of the left femur. An intramedullary nail and proximal and distal screws have been placed with  improved alignment. A displaced lesser trochanter fragment is again noted. IMPRESSION: Intraoperative images during ORIF of left femur fracture. Electronically Signed   By: Logan Bores M.D.   On: 03/18/2022 17:57     Subjective: Patient seen examined bedside, resting comfortably.  Eating breakfast.  Discharging to SNF today.  No complaints or concerns at this time.  Denies headache, no dizziness, no chest pain, no palpitations, no shortness of breath, no abdominal pain, no fever/chills/night sweats, no nausea/vomiting/diarrhea, no focal weakness, no fatigue, no paresthesias.  No acute events overnight per nurse staff.  Discharge Exam: Vitals:   03/23/22 0444 03/23/22 0723  BP: (!) 154/54 (!) 140/40  Pulse: 61 63  Resp: 18 16  Temp: 98 F (36.7 C) 97.8 F (36.6 C)  SpO2: 98% 96%   Vitals:   03/22/22 1559 03/22/22 2004 03/23/22 0444 03/23/22 0723  BP: (!) 139/50 (!) 137/53 (!) 154/54 (!) 140/40  Pulse: 61 60 61 63   Resp: '16 18 18 16  '$ Temp:  98.1 F (36.7 C) 98 F (36.7 C) 97.8 F (36.6 C)  TempSrc:    Oral  SpO2: 98% 98% 98% 96%  Weight:      Height:        Physical Exam: GEN: NAD, alert and oriented x 3, elderly in appearance HEENT: NCAT, PERRL, EOMI, sclera clear, MMM PULM: CTAB w/o wheezes/crackles, normal respiratory effort, on room air CV: RRR w/o M/G/R GI: abd soft, NTND, NABS, no R/G/M MSK: no peripheral edema, muscle strength globally intact 5/5 bilateral upper/lower extremities, neurovascularly intact, surgical site noted with dressings in place, clean/dry/intact NEURO: CN II-XII intact, no focal deficits, sensation to light touch intact PSYCH: normal mood/affect Integumentary: dry/intact, no rashes or wounds    The results of significant diagnostics from this hospitalization (including imaging, microbiology, ancillary and laboratory) are listed below for reference.     Microbiology: Recent Results (from the past 240 hour(s))  Surgical pcr screen     Status: None   Collection Time: 03/18/22  1:14 PM   Specimen: Nasal Mucosa; Nasal Swab  Result Value Ref Range Status   MRSA, PCR NEGATIVE NEGATIVE Final   Staphylococcus aureus NEGATIVE NEGATIVE Final    Comment: (NOTE) The Xpert SA Assay (FDA approved for NASAL specimens in patients 44 years of age and older), is one component of a comprehensive surveillance program. It is not intended to diagnose infection nor to guide or monitor treatment. Performed at [facility], [place] [address] [place],  [address]      Labs: BNP (last 3 results) Recent Labs    01/05/22 0116 01/06/22 0336 02/10/22 0357  BNP 1,484.9* 1,115.1* 5,400.8*   Basic Metabolic Panel: Recent Labs  Lab 03/19/22 0057 03/20/22 0205 03/21/22 0240 03/22/22 0143 03/23/22 0300  NA 132* 131* 135 136 135  K 4.2 4.5 4.2 4.7 4.1  CL 103 99 103 107 110  CO2 19* 22 24 19* 17*  GLUCOSE 310* 149* 95 107* 62*  BUN 63* 88* 95* 97* 84*   CREATININE 2.84* 3.39* 3.57* 2.87* 2.20*  CALCIUM 8.0* 7.7* 7.4* 7.4* 7.9*   Liver Function Tests: No results for input(s): AST, ALT, ALKPHOS, BILITOT, PROT, ALBUMIN in the last 168 hours. No results for input(s): LIPASE, AMYLASE in the last 168 hours. No results for input(s): AMMONIA in the last 168 hours. CBC: Recent Labs  Lab 03/18/22 0040 03/19/22 0057 03/19/22 1235 03/20/22 0205 03/20/22 1404 03/21/22 0240 03/21/22 1525 03/22/22 0143 03/23/22 0300  WBC 11.1*  6.2  --  11.2*  --  7.0  --  9.0 7.2  NEUTROABS 8.7*  --   --   --   --   --   --   --   --   HGB 10.4* 7.1*   < > 7.6* 8.4* 7.0* 8.6* 8.4* 8.3*  HCT 30.1* 20.8*   < > 21.6* 24.3* 20.8* 24.6* 24.7* 24.3*  MCV 95.6 95.9  --  91.1  --  94.1  --  93.9 94.6  PLT 155 106*  --  97*  --  89*  --  109* 123*   < > = values in this interval not displayed.   Cardiac Enzymes: No results for input(s): CKTOTAL, CKMB, CKMBINDEX, TROPONINI in the last 168 hours. BNP: Invalid input(s): POCBNP CBG: Recent Labs  Lab 03/23/22 0228 03/23/22 0252 03/23/22 0435 03/23/22 0444 03/23/22 0720  GLUCAP 49* 66* 76 87 129*   D-Dimer No results for input(s): DDIMER in the last 72 hours. Hgb A1c No results for input(s): HGBA1C in the last 72 hours. Lipid Profile No results for input(s): CHOL, HDL, LDLCALC, TRIG, CHOLHDL, LDLDIRECT in the last 72 hours. Thyroid function studies No results for input(s): TSH, T4TOTAL, T3FREE, THYROIDAB in the last 72 hours.  Invalid input(s): FREET3 Anemia work up Recent Labs    03/21/22 0240  VITAMINB12 261  FOLATE 10.6  FERRITIN 271  TIBC 183*  IRON 31  RETICCTPCT 1.8   Urinalysis    Component Value Date/Time   COLORURINE STRAW (A) 03/18/2022 0222   APPEARANCEUR CLEAR 03/18/2022 0222   LABSPEC 1.009 03/18/2022 0222   PHURINE 5.0 03/18/2022 0222   GLUCOSEU NEGATIVE 03/18/2022 0222   HGBUR NEGATIVE 03/18/2022 0222   BILIRUBINUR NEGATIVE 03/18/2022 0222   KETONESUR NEGATIVE 03/18/2022  0222   PROTEINUR 100 (A) 03/18/2022 0222   UROBILINOGEN 0.2 01/04/2012 1105   NITRITE NEGATIVE 03/18/2022 0222   LEUKOCYTESUR NEGATIVE 03/18/2022 0222   Sepsis Labs Invalid input(s): PROCALCITONIN,  WBC,  LACTICIDVEN Microbiology Recent Results (from the past 240 hour(s))  Surgical pcr screen     Status: None   Collection Time: 03/18/22  1:14 PM   Specimen: Nasal Mucosa; Nasal Swab  Result Value Ref Range Status   MRSA, PCR NEGATIVE NEGATIVE Final   Staphylococcus aureus NEGATIVE NEGATIVE Final    Comment: (NOTE) The Xpert SA Assay (FDA approved for NASAL specimens in patients 51 years of age and older), is one component of a comprehensive surveillance program. It is not intended to diagnose infection nor to guide or monitor treatment. Performed at [facility], [place] [address], [place], [place] [address]      Time coordinating discharge: Over 30 minutes  SIGNED:   Milicent Acheampong J [place] DO  [facility] 03/23/2022, 9:22 AM

## 2022-03-23 NOTE — TOC Transition Note (Signed)
Transition of Care [facility]) - CM/SW Discharge Note   Patient Details  Name: [name] MRN: [id number] Date of Birth: November 15, 1940  Transition of Care [facility]) CM/SW Contact:  Joanne Chars, LCSW Phone Number: 03/23/2022, 10:59 AM   Clinical Narrative:   Pt discharging to [place], room 706.  RN call report to [phone].     Final next level of care: Skilled Nursing Facility Barriers to Discharge: Barriers Resolved   Patient Goals and CMS Choice Patient states their goals for this hospitalization and ongoing recovery are:: return home CMS Medicare.gov Compare Post Acute Care list provided to:: Patient Choice offered to / list presented to : Patient  Discharge Placement              Patient chooses bed at:  [facility]) Patient to be transferred to facility by: [place] Name of family member notified: daugher [name] Patient and family notified of of transfer: 03/23/22  Discharge Plan and Services     Post Acute Care Choice: [place]                               Social Determinants of Health (SDOH) Interventions     Readmission Risk Interventions    02/11/2022    4:50 PM  Readmission Risk Prevention Plan  Transportation Screening Complete  PCP or Specialist Appt within 3-5 Days Complete  HRI or Home Care Consult Complete  Social Work Consult for [place] Planning/Counseling Complete  Palliative Care Screening Not Applicable  Medication Review Press photographer) Complete

## 2022-03-23 NOTE — TOC Progression Note (Signed)
Transition of Care [facility]) - Progression Note    Patient Details  Name: [name] MRN: [id number] Date of Birth: [birth year]  Transition of Care [facility]) CM/SW Contact  Joanne Chars, LCSW Phone Number: 03/23/2022, 8:21 AM  Clinical Narrative:   Auth approved in [place]: [id number], [id number], 3 days: 5/29-5/31.    Expected Discharge Plan: Skilled Nursing Facility Barriers to Discharge: Ship broker, Continued Medical Work up, SNF Pending bed offer  Expected Discharge Plan and Services Expected Discharge Plan: [place] Choice: [place] arrangements for the past 2 months: Single Family Home                                       Social Determinants of Health (SDOH) Interventions    Readmission Risk Interventions    02/11/2022    4:50 PM  Readmission Risk Prevention Plan  Transportation Screening Complete  PCP or Specialist Appt within 3-5 Days Complete  HRI or [place] Complete  Social Work Consult for [place] Planning/Counseling Complete  Palliative Care Screening Not Applicable  Medication Review Press photographer) Complete

## 2022-03-23 NOTE — Plan of Care (Signed)
  Problem: Education: Goal: Knowledge of General Education information will improve Description: Including pain rating scale, medication(s)/side effects and non-pharmacologic comfort measures 03/23/2022 1233 by Oris Drone L, LPN Outcome: Adequate for Discharge 03/23/2022 1233 by Eustace Quail, LPN Outcome: Adequate for Discharge   Problem: Health Behavior/Discharge Planning: Goal: Ability to manage health-related needs will improve 03/23/2022 1233 by Jamarie Joplin L, LPN Outcome: Adequate for Discharge 03/23/2022 1233 by Eustace Quail, LPN Outcome: Adequate for Discharge   Problem: Clinical Measurements: Goal: Ability to maintain clinical measurements within normal limits will improve 03/23/2022 1233 by Jeremey Bascom L, LPN Outcome: Adequate for Discharge 03/23/2022 1233 by Oris Drone L, LPN Outcome: Adequate for Discharge Goal: Will remain free from infection 03/23/2022 1233 by Oris Drone L, LPN Outcome: Adequate for Discharge 03/23/2022 1233 by Oris Drone L, LPN Outcome: Adequate for Discharge Goal: Diagnostic test results will improve 03/23/2022 1233 by Oris Drone L, LPN Outcome: Adequate for Discharge 03/23/2022 1233 by Oris Drone L, LPN Outcome: Adequate for Discharge Goal: Respiratory complications will improve 03/23/2022 1233 by Oris Drone L, LPN Outcome: Adequate for Discharge 03/23/2022 1233 by Oris Drone L, LPN Outcome: Adequate for Discharge Goal: Cardiovascular complication will be avoided 03/23/2022 1233 by Irie Dowson L, LPN Outcome: Adequate for Discharge 03/23/2022 1233 by Jamori Biggar, Lawanna Kobus, LPN Outcome: Adequate for Discharge   Problem: Activity: Goal: Risk for activity intolerance will decrease 03/23/2022 1233 by Nancyjo Givhan L, LPN Outcome: Adequate for Discharge 03/23/2022 1233 by Oris Drone L, LPN Outcome: Adequate for Discharge   Problem: Nutrition: Goal: Adequate nutrition will be  maintained 03/23/2022 1233 by Oris Drone L, LPN Outcome: Adequate for Discharge 03/23/2022 1233 by Eustace Quail, LPN Outcome: Adequate for Discharge   Problem: Coping: Goal: Level of anxiety will decrease 03/23/2022 1233 by Oris Drone L, LPN Outcome: Adequate for Discharge 03/23/2022 1233 by Eustace Quail, LPN Outcome: Adequate for Discharge   Problem: Elimination: Goal: Will not experience complications related to bowel motility 03/23/2022 1233 by Oris Drone L, LPN Outcome: Adequate for Discharge 03/23/2022 1233 by Eustace Quail, LPN Outcome: Adequate for Discharge Goal: Will not experience complications related to urinary retention 03/23/2022 1233 by Oris Drone L, LPN Outcome: Adequate for Discharge 03/23/2022 1233 by Ianna Salmela, Doreene Adas L, LPN Outcome: Adequate for Discharge   Problem: Pain Managment: Goal: General experience of comfort will improve 03/23/2022 1233 by Oris Drone L, LPN Outcome: Adequate for Discharge 03/23/2022 1233 by Eustace Quail, LPN Outcome: Adequate for Discharge   Problem: Safety: Goal: Ability to remain free from injury will improve 03/23/2022 1233 by Jossalin Chervenak L, LPN Outcome: Adequate for Discharge 03/23/2022 1233 by Eustace Quail, LPN Outcome: Adequate for Discharge   Problem: Skin Integrity: Goal: Risk for impaired skin integrity will decrease 03/23/2022 1233 by Oris Drone L, LPN Outcome: Adequate for Discharge 03/23/2022 1233 by Urie Loughner, Lawanna Kobus, LPN Outcome: Adequate for Discharge

## 2022-03-27 DIAGNOSIS — S72002D Fracture of unspecified part of neck of left femur, subsequent encounter for closed fracture with routine healing: Secondary | ICD-10-CM | POA: Diagnosis not present

## 2022-03-27 DIAGNOSIS — D62 Acute posthemorrhagic anemia: Secondary | ICD-10-CM | POA: Diagnosis not present

## 2022-03-27 DIAGNOSIS — G8918 Other acute postprocedural pain: Secondary | ICD-10-CM | POA: Diagnosis not present

## 2022-03-27 DIAGNOSIS — R262 Difficulty in walking, not elsewhere classified: Secondary | ICD-10-CM | POA: Diagnosis not present

## 2022-03-29 DIAGNOSIS — I509 Heart failure, unspecified: Secondary | ICD-10-CM | POA: Diagnosis not present

## 2022-03-29 DIAGNOSIS — Z79899 Other long term (current) drug therapy: Secondary | ICD-10-CM | POA: Diagnosis not present

## 2022-03-30 DIAGNOSIS — I10 Essential (primary) hypertension: Secondary | ICD-10-CM | POA: Diagnosis not present

## 2022-03-30 DIAGNOSIS — D649 Anemia, unspecified: Secondary | ICD-10-CM | POA: Diagnosis not present

## 2022-04-01 ENCOUNTER — Other Ambulatory Visit (HOSPITAL_COMMUNITY): Payer: Self-pay | Admitting: Orthopedic Surgery

## 2022-04-01 ENCOUNTER — Ambulatory Visit (HOSPITAL_COMMUNITY)
Admission: RE | Admit: 2022-04-01 | Discharge: 2022-04-01 | Disposition: A | Discharge status: Home / Self Care | Payer: Medicare Other | Source: Ambulatory Visit | Attending: Orthopedic Surgery | Admitting: Orthopedic Surgery

## 2022-04-01 DIAGNOSIS — R609 Edema, unspecified: Secondary | ICD-10-CM

## 2022-04-01 DIAGNOSIS — M80052A Age-related osteoporosis with current pathological fracture, left femur, initial encounter for fracture: Secondary | ICD-10-CM | POA: Diagnosis not present

## 2022-04-01 DIAGNOSIS — S72142A Displaced intertrochanteric fracture of left femur, initial encounter for closed fracture: Secondary | ICD-10-CM | POA: Diagnosis not present

## 2022-04-01 NOTE — Progress Notes (Signed)
Left lower extremity venous duplex completed. Refer to "CV Proc" under chart review to view preliminary results.  04/01/2022 3:19 PM Kelby Aline., MHA, RVT, RDCS, RDMS

## 2022-04-09 ENCOUNTER — Telehealth: Payer: Self-pay | Admitting: Internal Medicine

## 2022-04-09 NOTE — Telephone Encounter (Signed)
Patient's daughter [name] would like to speak to nurse about her mother's upcoming appt on 6/20.

## 2022-04-09 NOTE — Telephone Encounter (Signed)
-  Spoke to daughter -She stated her mother recently had hip surgery and was unsure if 6/20 needed to be reschedule. However, since initial call she was able to speak with facility nurse and formulate plan to have pt transported.  -Daughter stated the will keep scheduled appointment.

## 2022-04-12 ENCOUNTER — Ambulatory Visit: Payer: Medicare Other | Admitting: Internal Medicine

## 2022-04-12 DIAGNOSIS — N390 Urinary tract infection, site not specified: Secondary | ICD-10-CM | POA: Diagnosis not present

## 2022-04-13 ENCOUNTER — Ambulatory Visit: Payer: Medicare Other | Admitting: Nurse Practitioner

## 2022-04-13 NOTE — Progress Notes (Deleted)
Office Visit    Patient Name: [name] Date of Encounter: 04/13/2022  Primary Care Provider:  Leeroy Cha, MD Primary Cardiologist:  [name], MD  Chief Complaint    81 year old female with a history of CAD s/p CABG x3 (LIMA-LAD, SVG-OM, SVG-RCA) in 2013 with subsequent PCI/DES-SVG-OM in 2019, post-op atrial fibrillation, tachybradycardia syndrome s/p PPM, chronic diastolic heart failure, hypertension, hyperlipidemia, carotid artery stenosis, CKD stage III-IV, type 2 diabetes, and GI bleed who presents for follow-up related to CAD and heart failure.  Past Medical History    Past Medical History:  Diagnosis Date   Acute medial meniscus tear of right knee 07/02/2014   Acute renal failure,admitted 01/03/12, after admission for diastolic chf 40/98/1191   Anemia    Anxiety    Arthritis    "in my hands; knees, back" (09/27/2018)   CAD (coronary artery disease)    a. 40-59% bilaterally 10/2015.   Chronic diastolic CHF (congestive heart failure) ([place])    a. 05/2016 Echo: EF 60-65%, no rwma, Gr1 DD, Ao sclerosis w/o stenosis, triv MR;  b. 07/2016 TEE: EF 55-60%, no rwma, mild MR.   Chronic headaches    CKD (chronic kidney disease), stage III (HCC)    Coronary artery disease    a. 02/2007 Persantine MV: low risk;  b. 11/2011 CABG x 3 (LIMA->LAD, VG->OM, VG->RCA);  c. 05/2016 MV: EF >65%, no isch/infarct, horiz ST dep in I, II, V5-V6.   Depression    Diverticulosis    Esophageal stricture    GERD (gastroesophageal reflux disease)    Hemorrhoids    Hiatal hernia    Hyperkalemia    a. ARB stopped due to this.   Hyperlipidemia    Hypertension    Hypertensive heart disease    Hypothyroidism    Major depressive disorder with anxious distress 09/05/2019   Mild cognitive impairment    a. seen by neurology.   Mild vascular neurocognitive disorder 09/05/2019   Myocardial infarction ([place]) 12/07/2011   NSTEMI (non-ST elevated myocardial infarction) ([place]) 12/01/2011    PAF (paroxysmal atrial fibrillation) ([place])    a. post-op CABG.   Pain    RIGHT KNEE PAIN - TORN RIGHT MEDIAL MENISCUS   Paroxysmal atrial flutter ([place])    a. 07/2016 s/p TEE & DCCV;  b. 07/2016 Recurrent PAFlutter req initiation of amio & PPM in setting of tachy-brady;  c. CHA2DS2VASc = 7-->Xarelto 15 mg QD.   Pneumonia    "twice" (09/27/2018)   PONV (postoperative nausea and vomiting)    Presence of permanent cardiac pacemaker 08/12/2016   S/P CABG (coronary artery bypass graft), 12/04/11 12/07/2011   LIMA to LAD, SVG to OM, SVG to RCA   Sinus bradycardia    a. not on BB due to this.   Skin cancer    "face" (09/27/2018)   Tachy-brady syndrome ([place])    a. 07/2016 Jxnl brady following DCCV, recurrent Aflutter-->amio + SJM 2272 Assurity MRI DC PPM (ser # [id number]).   Type II diabetes mellitus ([place])    Past Surgical History:  Procedure Laterality Date   ABDOMINAL HYSTERECTOMY  1980's   ANKLE FRACTURE SURGERY Right    "put pins both side right ankle"   BACK SURGERY  2006   "cyst growing near my spine"   CARDIAC CATHETERIZATION  12/02/2011   mild LV dysfunction with mod hypocontractility of mid-distal anterolateral wall; CAD w/ostial tapering of L [place] with 50% diffuse ostial narrowing of LAD, 99% eccentric focal prox LAD stenosis  followed by 70% prox LAD stenosis after 1st diag, 20% mid LAD narrowing; 80% ostial-to-prox L Cfx stenosis & 40-50% irregularity of RCA (Dr. Corky Downs)   CARDIOVERSION N/A 08/11/2016   Procedure: CARDIOVERSION;  Surgeon: Lelon Perla, MD;  Location: [facility] ENDOSCOPY;  Service: Cardiovascular;  Laterality: N/A;   CATARACT EXTRACTION W/ INTRAOCULAR LENS  IMPLANT, BILATERAL Bilateral ~ 2010   [place] GRAFT  12/04/2011   Procedure: CORONARY ARTERY BYPASS GRAFTING (CABG);  Surgeon: Tharon Aquas Adelene Idler, MD;  Location: [place];  Service: Open Heart Surgery;  Laterality: N/A;  CABG x three,  using left internal mammary artery, and right leg  greater saphenous vein harvested endoscopically   CORONARY STENT INTERVENTION N/A 09/27/2018   Procedure: CORONARY STENT INTERVENTION;  Surgeon: Troy Sine, MD;  Location: [place] CV LAB;  Service: Cardiovascular;  Laterality: N/A;   DILATION AND CURETTAGE OF UTERUS     "a couple times"   EP IMPLANTABLE DEVICE N/A 08/12/2016   Procedure: Pacemaker Implant;  Surgeon: Will Meredith Leeds, MD;  Location: [place] CV LAB;  Service: Cardiovascular;  Laterality: N/A;   ESOPHAGOGASTRODUODENOSCOPY (EGD) WITH ESOPHAGEAL DILATION     FRACTURE SURGERY     INTRAMEDULLARY (IM) NAIL INTERTROCHANTERIC Left 03/18/2022   Procedure: INTRAMEDULLARY (IM) NAIL INTERTROCHANTRIC;  Surgeon: Georgeanna Harrison, MD;  Location: [place];  Service: Orthopedics;  Laterality: Left;   JOINT REPLACEMENT     KNEE ARTHROSCOPY WITH MEDIAL MENISECTOMY Right 07/02/2014   Procedure: RIGHT KNEE ARTHROSCOPY WITH PARTIAL MEDIAL MENISTECTOMY, ABRASION CONDROPLASTYU OF PATELLA,ABRASION CONDROPLASTY OF MEDIAL FEMEROL CONDYL, MICROFRACTURE OF MEDIAL FEMEROL CONDYL;  Surgeon: Tobi Bastos, MD;  Location: [facility];  Service: Orthopedics;  Laterality: Right;   LEFT HEART CATH AND CORS/GRAFTS ANGIOGRAPHY N/A 09/06/2018   Procedure: LEFT HEART CATH AND CORS/GRAFTS ANGIOGRAPHY;  Surgeon: Troy Sine, MD;  Location: [place] [facility] LAB;  Service: Cardiovascular;  Laterality: N/A;   LEFT HEART CATHETERIZATION WITH CORONARY ANGIOGRAM N/A 12/02/2011   Procedure: LEFT HEART CATHETERIZATION WITH CORONARY ANGIOGRAM;  Surgeon: Troy Sine, MD;  Location: [facility] CATH LAB;  Service: Cardiovascular;  Laterality: N/A;  Coronary angiogram, possible PCI   TEE WITHOUT CARDIOVERSION N/A 08/11/2016   Procedure: TRANSESOPHAGEAL ECHOCARDIOGRAM (TEE);  Surgeon: Lelon Perla, MD;  Location[facility] ENDOSCOPY;  Service: Cardiovascular;  Laterality: N/A;   TEE WITHOUT CARDIOVERSION N/A 05/18/2019   Procedure: TRANSESOPHAGEAL ECHOCARDIOGRAM (TEE);  Surgeon: [name], MD;  Location: [facility] ENDOSCOPY;  Service: Cardiovascular;  Laterality: N/A;   [place] Left ~ 2006   TRANSTHORACIC ECHOCARDIOGRAM  02/19/2013   EF 29-52%, grade 1 diastolic dysfunction; mildly thickend/calcified AV leaflets; mildly calcidied MV annulus; mild TR    Allergies  Allergies  Allergen Reactions   Clonidine Derivatives Other (See Comments)    Bradycardia and fatigue    Clonidine Hcl Other (See Comments)    Bradycardia   Crestor [Rosuvastatin] Other (See Comments)    Made the patient feel tired/weak   Losartan Potassium Other (See Comments)    Hyperkalemia   Sulfa Antibiotics Other (See Comments)    Childhood reaction not recalled   Epinephrine Other (See Comments)    Abnormal feeling. Dental exam/injection of local w/ epi.   Hydralazine Hcl Anxiety and Other (See Comments)    Nervousness, anxiousness, GI upset    History of Present Illness    81 year old female with the above past medical history including CAD  s/p CABG x3 (LIMA-LAD, SVG-OM, SVG-RCA) in 2013 with subsequent PCI/DES-SVG-OM in 2019, post-op atrial fibrillation, tachybradycardia syndrome s/p PPM, chronic diastolic heart failure, hypertension, hyperlipidemia, carotid artery stenosis, CKD stage III-IV, type 2 diabetes, and GI bleed.   Echocardiogram at the time of bypass surgery showed EF 55 to 65%, G1 DD, mildly thickened and calcified aortic valve leaflets with no stenosis, mild TR.  She presented to the ED in 2017 with atypical atrial flutter s/p TEE/DCCV.  She developed recurrent atrial flutter, also bradycardia s/p dual-chamber PPM with Dr. Curt Bears in October 2017.  She is not on anticoagulation due to history of GI bleed.  She developed progressive shortness of breath and chest tightness in November 2019.  Stress test at the time showed new area of ischemia in OM territory.  She underwent cardiac catheterization which showed high-grade graft disease with 95% ostial  stenosis in the SVG to OM s/p DES.  She was placed on Plavix and Xarelto.  Repeat Lexiscan Myoview in March 2021 in the setting of chest pressure showed moderate-sized partially reversible perfusion defect in apical inferior, apical lateral and apex territories, however, this was felt to be attenuation artifact.  She was hospitalized in October 2022 with chest pain and small NSTEMI.  Echocardiogram showed EF 50 to 55%, global hypokinesis, G1 DD, mildly reduced RV, trivial MR.  She was hospitalized in March 2023 with presumed pneumonia versus heart failure.  Repeat echocardiogram during that admission showed normal LV function, G2 DD, elevated LV filling pressures, RVSP of 39 mmHg.  She was treated with antibiotics, diuresed, and discharged to SNF. She was last seen in the office on 01/22/2022 for follow-up and was stable.  She presented to the ED 02/10/2022 with complaints of worsening shortness of breath.  Placed on BiPAP therapy.  BNP was elevated at 2214, troponin was mildly elevated with flat trend.  Chest x-ray showed mild progressive interstitial pulmonary edema.  Cardiology was consulted in the setting of acute on chronic diastolic heart failure.  Her symptoms improved with IV Lasix.  She was discharged home in stable condition on 02/12/2022.  Hospitalized again in May 2023 in the setting of left hip fracture s/p mechanical fall.  She underwent left intratrochanteric intramedullary nail on 03/19/2022.  She did have acute postop anemia.  He was also noted to be hyponatremic.  Mild was held in the setting of [name], decreased p.o. intake.  He was discharged to SNF in stable condition on 03/23/2022.  She presents today for follow-up.  Since her last visit and since her recent hospitalizations  Chronic diastolic heart failure: CAD: S/p CABG x3 (LIMA-LAD, SVG-OM, SVG-RCA) in 2013 with subsequent PCI/DES-SVG-OM in 2019 Post-op atrial fibrillation/atrial flutter: Tachybradycardia syndrome: S/p  PPM Hypertension: Hyperlipidemia: Carotid artery stenosis: Minimal BICA stenosis (L>R) on carotid dopplers in 2018.  CKD stage III-IV: Type 2 diabetes: Hyponatremia: Disposition:     Home Medications    Current Outpatient Medications  Medication Sig Dispense Refill   acetaminophen (TYLENOL) 650 MG CR tablet Take 650 mg by mouth every 8 (eight) hours as needed for pain.     allopurinol (ZYLOPRIM) 100 MG tablet Take 200 mg by mouth daily.     amLODipine (NORVASC) 5 MG tablet Take 1 tablet (5 mg total) by mouth daily. 30 tablet 2   aspirin EC 81 MG tablet Take 1 tablet (81 mg total) by mouth daily. 30 tablet 11   carvedilol (COREG) 25 MG tablet Take 1 tablet (25 mg total) by mouth 2 (two)  times daily. 60 tablet 0   diclofenac Sodium (VOLTAREN) 1 % GEL Apply 2 g topically 2 (two) times daily as needed (pain).     docusate sodium (COLACE) 100 MG capsule Take 100 mg by mouth in the morning and at bedtime.     enoxaparin (LOVENOX) 30 MG/0.3ML injection Inject 0.3 mLs (30 mg total) into the skin daily. 12.6 mL 0   ferrous sulfate 325 (65 FE) MG tablet Take 1 tablet (325 mg total) by mouth 2 (two) times daily with a meal.  3   gabapentin (NEURONTIN) 300 MG capsule Take 1 capsule (300 mg total) by mouth at bedtime. 30 capsule 2   HYDROcodone-acetaminophen (NORCO/VICODIN) 5-325 MG tablet Take 1-2 tablets by mouth every 4 (four) hours as needed for moderate pain (pain score 4-6). 30 tablet 0   insulin glargine (LANTUS) 100 UNIT/ML injection Inject 0.15 mLs (15 Units total) into the skin daily.  0   isosorbide mononitrate (IMDUR) 30 MG 24 hr tablet Take 1 tablet (30 mg total) by mouth daily. 30 tablet 10   meclizine (ANTIVERT) 25 MG tablet Take 25 mg by mouth daily as needed for dizziness.     nitroGLYCERIN (NITROSTAT) 0.4 MG SL tablet Place 1 tablet (0.4 mg total) under the tongue every 5 (five) minutes as needed for chest pain (max 3 doses). 25 tablet 3   pantoprazole (PROTONIX) 40 MG tablet Take  40 mg by mouth daily before breakfast.     Polyethyl Glycol-Propyl Glycol (SYSTANE OP) Place 1 drop into both eyes 2 (two) times daily as needed (dry eyes).     polyethylene glycol (MIRALAX / GLYCOLAX) 17 g packet Take 17 g by mouth daily. (Patient taking differently: Take 17 g by mouth daily as needed for mild constipation.) 14 each 0   rosuvastatin (CRESTOR) 10 MG tablet Take 10 mg by mouth at bedtime.     sertraline (ZOLOFT) 25 MG tablet Take 25 mg by mouth daily.     SYNTHROID 100 MCG tablet Take 100 mcg by mouth at bedtime.     No current facility-administered medications for this visit.     Review of Systems    ***.  All other systems reviewed and are otherwise negative except as noted above.    Physical Exam    VS:  There were no vitals taken for this visit. , BMI There is no height or weight on file to calculate BMI.     GEN: Well nourished, well developed, in no acute distress. HEENT: normal. Neck: Supple, no JVD, carotid bruits, or masses. Cardiac: RRR, no murmurs, rubs, or gallops. No clubbing, cyanosis, edema.  Radials/DP/PT 2+ and equal bilaterally.  Respiratory:  Respirations regular and unlabored, clear to auscultation bilaterally. GI: Soft, nontender, nondistended, BS + x 4. MS: no deformity or atrophy. Skin: warm and dry, no rash. Neuro:  Strength and sensation are intact. Psych: Normal affect.  Accessory Clinical Findings    ECG personally reviewed by me today - *** - no acute changes.  Lab Results  Component Value Date   WBC 7.2 03/23/2022   HGB 8.3 (L) 03/23/2022   HCT 24.3 (L) 03/23/2022   MCV 94.6 03/23/2022   PLT 123 (L) 03/23/2022   Lab Results  Component Value Date   CREATININE 2.20 (H) 03/23/2022   BUN 84 (H) 03/23/2022   NA 135 03/23/2022   K 4.1 03/23/2022   CL 110 03/23/2022   CO2 17 (L) 03/23/2022   Lab Results  Component Value Date  ALT 17 02/10/2022   AST 34 02/10/2022   ALKPHOS 75 02/10/2022   BILITOT 0.6 02/10/2022   Lab  Results  Component Value Date   CHOL 203 (H) 09/28/2018   HDL 25 (L) 09/28/2018   LDLCALC 114 (H) 09/28/2018   TRIG 320 (H) 09/28/2018   CHOLHDL 8.1 09/28/2018    Lab Results  Component Value Date   HGBA1C 6.6 (H) 02/11/2022    Assessment & Plan    1.  ***   [name], NP 04/13/2022, 5:30 AM

## 2022-04-16 DIAGNOSIS — R2689 Other abnormalities of gait and mobility: Secondary | ICD-10-CM | POA: Diagnosis not present

## 2022-04-16 DIAGNOSIS — D649 Anemia, unspecified: Secondary | ICD-10-CM | POA: Diagnosis not present

## 2022-04-16 DIAGNOSIS — S72142D Displaced intertrochanteric fracture of left femur, subsequent encounter for closed fracture with routine healing: Secondary | ICD-10-CM | POA: Diagnosis not present

## 2022-04-16 DIAGNOSIS — N184 Chronic kidney disease, stage 4 (severe): Secondary | ICD-10-CM | POA: Diagnosis not present

## 2022-04-16 DIAGNOSIS — Z79899 Other long term (current) drug therapy: Secondary | ICD-10-CM | POA: Diagnosis not present

## 2022-04-16 DIAGNOSIS — I10 Essential (primary) hypertension: Secondary | ICD-10-CM | POA: Diagnosis not present

## 2022-04-16 DIAGNOSIS — M6281 Muscle weakness (generalized): Secondary | ICD-10-CM | POA: Diagnosis not present

## 2022-04-16 DIAGNOSIS — R279 Unspecified lack of coordination: Secondary | ICD-10-CM | POA: Diagnosis not present

## 2022-04-16 DIAGNOSIS — I509 Heart failure, unspecified: Secondary | ICD-10-CM | POA: Diagnosis not present

## 2022-04-18 DIAGNOSIS — N184 Chronic kidney disease, stage 4 (severe): Secondary | ICD-10-CM | POA: Diagnosis not present

## 2022-04-18 DIAGNOSIS — R279 Unspecified lack of coordination: Secondary | ICD-10-CM | POA: Diagnosis not present

## 2022-04-18 DIAGNOSIS — M6281 Muscle weakness (generalized): Secondary | ICD-10-CM | POA: Diagnosis not present

## 2022-04-18 DIAGNOSIS — R2689 Other abnormalities of gait and mobility: Secondary | ICD-10-CM | POA: Diagnosis not present

## 2022-04-18 DIAGNOSIS — S72142D Displaced intertrochanteric fracture of left femur, subsequent encounter for closed fracture with routine healing: Secondary | ICD-10-CM | POA: Diagnosis not present

## 2022-04-19 DIAGNOSIS — R279 Unspecified lack of coordination: Secondary | ICD-10-CM | POA: Diagnosis not present

## 2022-04-19 DIAGNOSIS — N184 Chronic kidney disease, stage 4 (severe): Secondary | ICD-10-CM | POA: Diagnosis not present

## 2022-04-19 DIAGNOSIS — M6281 Muscle weakness (generalized): Secondary | ICD-10-CM | POA: Diagnosis not present

## 2022-04-19 DIAGNOSIS — S72142D Displaced intertrochanteric fracture of left femur, subsequent encounter for closed fracture with routine healing: Secondary | ICD-10-CM | POA: Diagnosis not present

## 2022-04-19 DIAGNOSIS — I10 Essential (primary) hypertension: Secondary | ICD-10-CM | POA: Diagnosis not present

## 2022-04-19 DIAGNOSIS — R2689 Other abnormalities of gait and mobility: Secondary | ICD-10-CM | POA: Diagnosis not present

## 2022-04-21 ENCOUNTER — Encounter (HOSPITAL_COMMUNITY): Payer: Self-pay

## 2022-04-21 ENCOUNTER — Inpatient Hospital Stay (HOSPITAL_COMMUNITY)
Admission: EM | Admit: 2022-04-21 | Discharge: 2022-05-01 | DRG: 291 | Disposition: A | Discharge status: Skilled Nursing Facility | Payer: Medicare Other | Source: Skilled Nursing Facility | Attending: Internal Medicine | Admitting: Internal Medicine

## 2022-04-21 ENCOUNTER — Emergency Department (HOSPITAL_COMMUNITY): Payer: Medicare Other

## 2022-04-21 ENCOUNTER — Other Ambulatory Visit: Payer: Self-pay

## 2022-04-21 DIAGNOSIS — R2689 Other abnormalities of gait and mobility: Secondary | ICD-10-CM | POA: Diagnosis not present

## 2022-04-21 DIAGNOSIS — I252 Old myocardial infarction: Secondary | ICD-10-CM

## 2022-04-21 DIAGNOSIS — E119 Type 2 diabetes mellitus without complications: Secondary | ICD-10-CM | POA: Diagnosis not present

## 2022-04-21 DIAGNOSIS — Z8249 Family history of ischemic heart disease and other diseases of the circulatory system: Secondary | ICD-10-CM

## 2022-04-21 DIAGNOSIS — F419 Anxiety disorder, unspecified: Secondary | ICD-10-CM | POA: Diagnosis present

## 2022-04-21 DIAGNOSIS — I495 Sick sinus syndrome: Secondary | ICD-10-CM | POA: Diagnosis not present

## 2022-04-21 DIAGNOSIS — E872 Acidosis, unspecified: Secondary | ICD-10-CM | POA: Diagnosis not present

## 2022-04-21 DIAGNOSIS — I2510 Atherosclerotic heart disease of native coronary artery without angina pectoris: Secondary | ICD-10-CM | POA: Diagnosis present

## 2022-04-21 DIAGNOSIS — Z794 Long term (current) use of insulin: Secondary | ICD-10-CM

## 2022-04-21 DIAGNOSIS — I5030 Unspecified diastolic (congestive) heart failure: Secondary | ICD-10-CM | POA: Diagnosis not present

## 2022-04-21 DIAGNOSIS — E440 Moderate protein-calorie malnutrition: Secondary | ICD-10-CM | POA: Diagnosis not present

## 2022-04-21 DIAGNOSIS — R918 Other nonspecific abnormal finding of lung field: Secondary | ICD-10-CM | POA: Diagnosis not present

## 2022-04-21 DIAGNOSIS — E1159 Type 2 diabetes mellitus with other circulatory complications: Secondary | ICD-10-CM | POA: Diagnosis not present

## 2022-04-21 DIAGNOSIS — I129 Hypertensive chronic kidney disease with stage 1 through stage 4 chronic kidney disease, or unspecified chronic kidney disease: Secondary | ICD-10-CM | POA: Diagnosis not present

## 2022-04-21 DIAGNOSIS — Z66 Do not resuscitate: Secondary | ICD-10-CM | POA: Diagnosis not present

## 2022-04-21 DIAGNOSIS — Z85828 Personal history of other malignant neoplasm of skin: Secondary | ICD-10-CM

## 2022-04-21 DIAGNOSIS — D631 Anemia in chronic kidney disease: Secondary | ICD-10-CM | POA: Diagnosis present

## 2022-04-21 DIAGNOSIS — R110 Nausea: Secondary | ICD-10-CM | POA: Diagnosis not present

## 2022-04-21 DIAGNOSIS — I130 Hypertensive heart and chronic kidney disease with heart failure and stage 1 through stage 4 chronic kidney disease, or unspecified chronic kidney disease: Principal | ICD-10-CM | POA: Diagnosis present

## 2022-04-21 DIAGNOSIS — I517 Cardiomegaly: Secondary | ICD-10-CM | POA: Diagnosis not present

## 2022-04-21 DIAGNOSIS — F0393 Unspecified dementia, unspecified severity, with mood disturbance: Secondary | ICD-10-CM | POA: Diagnosis present

## 2022-04-21 DIAGNOSIS — Z79890 Hormone replacement therapy (postmenopausal): Secondary | ICD-10-CM

## 2022-04-21 DIAGNOSIS — I152 Hypertension secondary to endocrine disorders: Secondary | ICD-10-CM | POA: Diagnosis not present

## 2022-04-21 DIAGNOSIS — E1140 Type 2 diabetes mellitus with diabetic neuropathy, unspecified: Secondary | ICD-10-CM | POA: Diagnosis not present

## 2022-04-21 DIAGNOSIS — E785 Hyperlipidemia, unspecified: Secondary | ICD-10-CM | POA: Diagnosis not present

## 2022-04-21 DIAGNOSIS — I509 Heart failure, unspecified: Principal | ICD-10-CM

## 2022-04-21 DIAGNOSIS — E871 Hypo-osmolality and hyponatremia: Secondary | ICD-10-CM | POA: Diagnosis present

## 2022-04-21 DIAGNOSIS — K219 Gastro-esophageal reflux disease without esophagitis: Secondary | ICD-10-CM | POA: Diagnosis present

## 2022-04-21 DIAGNOSIS — R279 Unspecified lack of coordination: Secondary | ICD-10-CM | POA: Diagnosis not present

## 2022-04-21 DIAGNOSIS — I5033 Acute on chronic diastolic (congestive) heart failure: Secondary | ICD-10-CM | POA: Diagnosis not present

## 2022-04-21 DIAGNOSIS — R6889 Other general symptoms and signs: Secondary | ICD-10-CM | POA: Diagnosis not present

## 2022-04-21 DIAGNOSIS — E039 Hypothyroidism, unspecified: Secondary | ICD-10-CM | POA: Diagnosis present

## 2022-04-21 DIAGNOSIS — E1169 Type 2 diabetes mellitus with other specified complication: Secondary | ICD-10-CM | POA: Diagnosis not present

## 2022-04-21 DIAGNOSIS — D638 Anemia in other chronic diseases classified elsewhere: Secondary | ICD-10-CM | POA: Diagnosis not present

## 2022-04-21 DIAGNOSIS — Z7189 Other specified counseling: Secondary | ICD-10-CM | POA: Diagnosis not present

## 2022-04-21 DIAGNOSIS — R040 Epistaxis: Secondary | ICD-10-CM | POA: Diagnosis not present

## 2022-04-21 DIAGNOSIS — N179 Acute kidney failure, unspecified: Secondary | ICD-10-CM | POA: Diagnosis present

## 2022-04-21 DIAGNOSIS — Z7401 Bed confinement status: Secondary | ICD-10-CM | POA: Diagnosis not present

## 2022-04-21 DIAGNOSIS — Z955 Presence of coronary angioplasty implant and graft: Secondary | ICD-10-CM | POA: Diagnosis not present

## 2022-04-21 DIAGNOSIS — K5900 Constipation, unspecified: Secondary | ICD-10-CM | POA: Diagnosis present

## 2022-04-21 DIAGNOSIS — Z79899 Other long term (current) drug therapy: Secondary | ICD-10-CM

## 2022-04-21 DIAGNOSIS — Z96652 Presence of left artificial knee joint: Secondary | ICD-10-CM | POA: Diagnosis present

## 2022-04-21 DIAGNOSIS — Z961 Presence of intraocular lens: Secondary | ICD-10-CM | POA: Diagnosis present

## 2022-04-21 DIAGNOSIS — Z83438 Family history of other disorder of lipoprotein metabolism and other lipidemia: Secondary | ICD-10-CM

## 2022-04-21 DIAGNOSIS — I700 Atherosclerosis of aorta: Secondary | ICD-10-CM | POA: Diagnosis not present

## 2022-04-21 DIAGNOSIS — Z6825 Body mass index (BMI) 25.0-25.9, adult: Secondary | ICD-10-CM

## 2022-04-21 DIAGNOSIS — E1165 Type 2 diabetes mellitus with hyperglycemia: Secondary | ICD-10-CM | POA: Diagnosis not present

## 2022-04-21 DIAGNOSIS — I480 Paroxysmal atrial fibrillation: Secondary | ICD-10-CM | POA: Diagnosis present

## 2022-04-21 DIAGNOSIS — N184 Chronic kidney disease, stage 4 (severe): Secondary | ICD-10-CM | POA: Diagnosis not present

## 2022-04-21 DIAGNOSIS — Z9842 Cataract extraction status, left eye: Secondary | ICD-10-CM

## 2022-04-21 DIAGNOSIS — Z951 Presence of aortocoronary bypass graft: Secondary | ICD-10-CM | POA: Diagnosis not present

## 2022-04-21 DIAGNOSIS — I25709 Atherosclerosis of coronary artery bypass graft(s), unspecified, with unspecified angina pectoris: Secondary | ICD-10-CM

## 2022-04-21 DIAGNOSIS — Z882 Allergy status to sulfonamides status: Secondary | ICD-10-CM

## 2022-04-21 DIAGNOSIS — Z7982 Long term (current) use of aspirin: Secondary | ICD-10-CM

## 2022-04-21 DIAGNOSIS — K2100 Gastro-esophageal reflux disease with esophagitis, without bleeding: Secondary | ICD-10-CM | POA: Diagnosis not present

## 2022-04-21 DIAGNOSIS — E876 Hypokalemia: Secondary | ICD-10-CM | POA: Diagnosis not present

## 2022-04-21 DIAGNOSIS — E1122 Type 2 diabetes mellitus with diabetic chronic kidney disease: Secondary | ICD-10-CM | POA: Diagnosis not present

## 2022-04-21 DIAGNOSIS — M6281 Muscle weakness (generalized): Secondary | ICD-10-CM | POA: Diagnosis not present

## 2022-04-21 DIAGNOSIS — D649 Anemia, unspecified: Secondary | ICD-10-CM | POA: Diagnosis not present

## 2022-04-21 DIAGNOSIS — S72142D Displaced intertrochanteric fracture of left femur, subsequent encounter for closed fracture with routine healing: Secondary | ICD-10-CM | POA: Diagnosis not present

## 2022-04-21 DIAGNOSIS — N390 Urinary tract infection, site not specified: Secondary | ICD-10-CM | POA: Diagnosis present

## 2022-04-21 DIAGNOSIS — Z833 Family history of diabetes mellitus: Secondary | ICD-10-CM

## 2022-04-21 DIAGNOSIS — I5032 Chronic diastolic (congestive) heart failure: Secondary | ICD-10-CM | POA: Diagnosis not present

## 2022-04-21 DIAGNOSIS — Z888 Allergy status to other drugs, medicaments and biological substances status: Secondary | ICD-10-CM

## 2022-04-21 DIAGNOSIS — R1310 Dysphagia, unspecified: Secondary | ICD-10-CM | POA: Diagnosis not present

## 2022-04-21 DIAGNOSIS — Z950 Presence of cardiac pacemaker: Secondary | ICD-10-CM | POA: Diagnosis not present

## 2022-04-21 DIAGNOSIS — Z515 Encounter for palliative care: Secondary | ICD-10-CM | POA: Diagnosis not present

## 2022-04-21 DIAGNOSIS — Z743 Need for continuous supervision: Secondary | ICD-10-CM | POA: Diagnosis not present

## 2022-04-21 DIAGNOSIS — Z803 Family history of malignant neoplasm of breast: Secondary | ICD-10-CM

## 2022-04-21 DIAGNOSIS — Z801 Family history of malignant neoplasm of trachea, bronchus and lung: Secondary | ICD-10-CM

## 2022-04-21 DIAGNOSIS — W19XXXD Unspecified fall, subsequent encounter: Secondary | ICD-10-CM | POA: Diagnosis not present

## 2022-04-21 DIAGNOSIS — N19 Unspecified kidney failure: Secondary | ICD-10-CM | POA: Diagnosis not present

## 2022-04-21 DIAGNOSIS — I110 Hypertensive heart disease with heart failure: Secondary | ICD-10-CM | POA: Diagnosis not present

## 2022-04-21 DIAGNOSIS — R531 Weakness: Secondary | ICD-10-CM | POA: Diagnosis not present

## 2022-04-21 DIAGNOSIS — Z9841 Cataract extraction status, right eye: Secondary | ICD-10-CM

## 2022-04-21 DIAGNOSIS — E8779 Other fluid overload: Secondary | ICD-10-CM | POA: Diagnosis not present

## 2022-04-21 DIAGNOSIS — Z823 Family history of stroke: Secondary | ICD-10-CM

## 2022-04-21 LAB — TROPONIN I (HIGH SENSITIVITY): Troponin I (High Sensitivity): 25 ng/L — ABNORMAL HIGH (ref <18)

## 2022-04-21 LAB — CBC WITH DIFFERENTIAL/PLATELET
Abs Immature Granulocytes: 0.04 10*3/uL (ref 0.00–0.07)
Basophils Absolute: 0 10*3/uL (ref 0.0–0.1)
Basophils Relative: 0 %
Eosinophils Absolute: 0 10*3/uL (ref 0.0–0.5)
Eosinophils Relative: 0 %
HCT: 23 % — ABNORMAL LOW (ref 36.0–46.0)
Hemoglobin: 7.5 g/dL — ABNORMAL LOW (ref 12.0–15.0)
Immature Granulocytes: 0 %
Lymphocytes Relative: 6 %
Lymphs Abs: 0.6 10*3/uL — ABNORMAL LOW (ref 0.7–4.0)
MCH: 32.8 pg (ref 26.0–34.0)
MCHC: 32.6 g/dL (ref 30.0–36.0)
MCV: 100.4 fL — ABNORMAL HIGH (ref 80.0–100.0)
Monocytes Absolute: 1 10*3/uL (ref 0.1–1.0)
Monocytes Relative: 11 %
Neutro Abs: 7.3 10*3/uL (ref 1.7–7.7)
Neutrophils Relative %: 83 %
Platelets: 153 10*3/uL (ref 150–400)
RBC: 2.29 MIL/uL — ABNORMAL LOW (ref 3.87–5.11)
RDW: 17.3 % — ABNORMAL HIGH (ref 11.5–15.5)
WBC: 8.9 10*3/uL (ref 4.0–10.5)
nRBC: 0 % (ref 0.0–0.2)

## 2022-04-21 LAB — COMPREHENSIVE METABOLIC PANEL
ALT: 25 U/L (ref 0–44)
AST: 38 U/L (ref 15–41)
Albumin: 2.9 g/dL — ABNORMAL LOW (ref 3.5–5.0)
Alkaline Phosphatase: 217 U/L — ABNORMAL HIGH (ref 38–126)
Anion gap: 12 (ref 5–15)
BUN: 100 mg/dL — ABNORMAL HIGH (ref 8–23)
CO2: 20 mmol/L — ABNORMAL LOW (ref 22–32)
Calcium: 7.8 mg/dL — ABNORMAL LOW (ref 8.9–10.3)
Chloride: 99 mmol/L (ref 98–111)
Creatinine, Ser: 4.43 mg/dL — ABNORMAL HIGH (ref 0.44–1.00)
GFR, Estimated: 9 mL/min — ABNORMAL LOW (ref >60)
Glucose, Bld: 226 mg/dL — ABNORMAL HIGH (ref 70–99)
Potassium: 4.7 mmol/L (ref 3.5–5.1)
Sodium: 131 mmol/L — ABNORMAL LOW (ref 135–145)
Total Bilirubin: 0.7 mg/dL (ref 0.3–1.2)
Total Protein: 5.9 g/dL — ABNORMAL LOW (ref 6.5–8.1)

## 2022-04-21 LAB — BRAIN NATRIURETIC PEPTIDE: B Natriuretic Peptide: 1241 pg/mL — ABNORMAL HIGH (ref 0.0–100.0)

## 2022-04-21 MED ORDER — PANTOPRAZOLE SODIUM 40 MG PO TBEC
40.0000 mg | DELAYED_RELEASE_TABLET | Freq: Every day | ORAL | Status: DC
  Administered 2022-04-22 – 2022-05-01 (×10): 40 mg via ORAL
  Filled 2022-04-21 (×10): qty 1

## 2022-04-21 MED ORDER — FUROSEMIDE 10 MG/ML IJ SOLN
40.0000 mg | Freq: Two times a day (BID) | INTRAMUSCULAR | Status: DC
  Administered 2022-04-22: 40 mg via INTRAVENOUS
  Filled 2022-04-21: qty 4

## 2022-04-21 MED ORDER — CARVEDILOL 25 MG PO TABS
25.0000 mg | ORAL_TABLET | Freq: Two times a day (BID) | ORAL | Status: DC
  Administered 2022-04-22: 25 mg via ORAL
  Filled 2022-04-21: qty 1

## 2022-04-21 MED ORDER — ACETAMINOPHEN 325 MG PO TABS
650.0000 mg | ORAL_TABLET | Freq: Four times a day (QID) | ORAL | Status: DC | PRN
  Administered 2022-04-22 – 2022-05-01 (×5): 650 mg via ORAL
  Filled 2022-04-21 (×5): qty 2

## 2022-04-21 MED ORDER — ENOXAPARIN SODIUM 30 MG/0.3ML IJ SOSY
30.0000 mg | PREFILLED_SYRINGE | INTRAMUSCULAR | Status: DC
  Administered 2022-04-22 – 2022-04-25 (×4): 30 mg via SUBCUTANEOUS
  Filled 2022-04-21 (×5): qty 0.3

## 2022-04-21 MED ORDER — ISOSORBIDE MONONITRATE ER 30 MG PO TB24
30.0000 mg | ORAL_TABLET | Freq: Every day | ORAL | Status: DC
  Administered 2022-04-22 – 2022-05-01 (×10): 30 mg via ORAL
  Filled 2022-04-21 (×10): qty 1

## 2022-04-21 MED ORDER — ROSUVASTATIN CALCIUM 10 MG PO TABS
10.0000 mg | ORAL_TABLET | Freq: Every day | ORAL | Status: DC
  Administered 2022-04-22 – 2022-04-30 (×8): 10 mg via ORAL
  Filled 2022-04-21 (×9): qty 1

## 2022-04-21 MED ORDER — SODIUM CHLORIDE 0.9% FLUSH
3.0000 mL | Freq: Two times a day (BID) | INTRAVENOUS | Status: DC
  Administered 2022-04-22 – 2022-05-01 (×10): 3 mL via INTRAVENOUS

## 2022-04-21 MED ORDER — SERTRALINE HCL 25 MG PO TABS
25.0000 mg | ORAL_TABLET | Freq: Every day | ORAL | Status: DC
  Administered 2022-04-22 – 2022-04-25 (×4): 25 mg via ORAL
  Filled 2022-04-21 (×4): qty 1

## 2022-04-21 MED ORDER — ACETAMINOPHEN 650 MG RE SUPP: 650.0000 mg | Freq: Four times a day (QID) | RECTAL | Status: DC | PRN

## 2022-04-21 MED ORDER — INSULIN ASPART 100 UNIT/ML IJ SOLN
0.0000 [IU] | Freq: Three times a day (TID) | INTRAMUSCULAR | Status: DC
  Administered 2022-04-22: 1 [IU] via SUBCUTANEOUS
  Administered 2022-04-22 – 2022-04-23 (×3): 2 [IU] via SUBCUTANEOUS
  Administered 2022-04-23 – 2022-04-24 (×3): 1 [IU] via SUBCUTANEOUS
  Administered 2022-04-24: 2 [IU] via SUBCUTANEOUS
  Administered 2022-04-24: 1 [IU] via SUBCUTANEOUS
  Administered 2022-04-25 (×2): 2 [IU] via SUBCUTANEOUS
  Administered 2022-04-25: 1 [IU] via SUBCUTANEOUS
  Administered 2022-04-26 (×3): 2 [IU] via SUBCUTANEOUS
  Administered 2022-04-27: 3 [IU] via SUBCUTANEOUS
  Administered 2022-04-27 (×2): 2 [IU] via SUBCUTANEOUS
  Administered 2022-04-28 (×2): 3 [IU] via SUBCUTANEOUS
  Administered 2022-04-28: 2 [IU] via SUBCUTANEOUS
  Administered 2022-04-29: 5 [IU] via SUBCUTANEOUS
  Administered 2022-04-29: 1 [IU] via SUBCUTANEOUS
  Administered 2022-04-29: 5 [IU] via SUBCUTANEOUS
  Administered 2022-04-30: 1 [IU] via SUBCUTANEOUS
  Administered 2022-04-30 – 2022-05-01 (×3): 2 [IU] via SUBCUTANEOUS
  Administered 2022-05-01: 3 [IU] via SUBCUTANEOUS
  Filled 2022-04-21: qty 0.09

## 2022-04-21 MED ORDER — FUROSEMIDE 10 MG/ML IJ SOLN
40.0000 mg | Freq: Once | INTRAMUSCULAR | Status: AC
  Administered 2022-04-21: 40 mg via INTRAVENOUS
  Filled 2022-04-21: qty 4

## 2022-04-21 MED ORDER — POLYETHYLENE GLYCOL 3350 17 G PO PACK
17.0000 g | PACK | Freq: Every day | ORAL | Status: DC | PRN
Start: 2022-04-21 — End: 2022-05-01
  Administered 2022-04-30: 17 g via ORAL
  Filled 2022-04-21 (×2): qty 1

## 2022-04-21 MED ORDER — ASPIRIN 81 MG PO TBEC
81.0000 mg | DELAYED_RELEASE_TABLET | Freq: Every day | ORAL | Status: DC
Start: 2022-04-22 — End: 2022-04-26
  Administered 2022-04-22 – 2022-04-26 (×5): 81 mg via ORAL
  Filled 2022-04-21 (×5): qty 1

## 2022-04-21 MED ORDER — LEVOTHYROXINE SODIUM 100 MCG PO TABS
100.0000 ug | ORAL_TABLET | Freq: Every day | ORAL | Status: DC
  Administered 2022-04-22 – 2022-04-30 (×9): 100 ug via ORAL
  Filled 2022-04-21 (×10): qty 1

## 2022-04-21 NOTE — ED Triage Notes (Signed)
Patient BIB EMS from [facility] in [place] for abnormal labs, BUN of 97.7.

## 2022-04-21 NOTE — H&P (Signed)
History and Physical   [name] KQA:[id number] DOB: [birth year] DOA: 04/21/2022  PCP: [name] Cha, MD   Patient coming from: [place] home  Chief Complaint: Abnormal labs, elevated BUN.  HPI: [name] is a 81 y.o. female with medical history significant of CAD status post CABG, GERD, paroxysmal A-fib, sick sinus syndrome status post pacemaker, anemia, diabetes, neuropathy, hyperlipidemia, hypertension, hypothyroidism, CKD 4, diastolic CHF, depression, GI bleed presenting with abnormal lab value from nursing home.  Patient had recent hip fracture repair last month and was discharged to rehab from there.  She presents today with abnormal lab value with elevated BUN.  After her recent admission her home diuretic had been held due to [name] that responded to IV fluids.  Since that time she has had increasing edema unclear exactly when it started.  Did have a DVT study to evaluate for this on the eighth of this month which was negative.  Reports ongoing cough. She denies any specific symptoms and at times unsure why she is at the hospital.  She further denies fevers, chills, chest pain, shortness of breath, abdominal pain, constipation, diarrhea, nausea, vomiting.    Daughter reports patient reporting recent eye crusting, monitor for conjunctivitis. Also sounds like she got IVF in the week prior to her presenting and had been starting and stopping her diuretic at the facility.  ED Course: Vital signs in the ED notable for blood pressure in 794F systolic.  Lab work-up included CMP with sodium 131, bicarb 20, BUN 100, creatinine elevated 4.43 from baseline of 2.4-2.8, glucose 226, calcium 7.8 which improved considering albumin of 2.9, protein 5.9, alk phos 217.  CBC with hemoglobin of 7.5 near baseline of 8.  BNP elevated to 1241 with troponin mildly elevated at 25 with repeat pending.  Chest x-ray showed cardiomegaly and possible right base opacity.  Patient received dose of  diuretics in the ED.  Review of Systems: As per HPI otherwise all other systems reviewed and are negative.  Past Medical History:  Diagnosis Date   Acute medial meniscus tear of right knee 07/02/2014   Acute renal failure,admitted 01/03/12, after admission for diastolic chf 27/61/4709   Anemia    Anxiety    Arthritis    "in my hands; knees, back" (09/27/2018)   CAD (coronary artery disease)    a. 40-59% bilaterally 10/2015.   CAD S/P percutaneous coronary angioplasty 09/27/2018   S/P SVG-OM PCI with DES 09/27/18   Chronic diastolic CHF (congestive heart failure) ([place])    a. 05/2016 Echo: EF 60-65%, no rwma, Gr1 DD, Ao sclerosis w/o stenosis, triv MR;  b. 07/2016 TEE: EF 55-60%, no rwma, mild MR.   Chronic headaches    CKD (chronic kidney disease), stage III (HCC)    Coronary artery disease    a. 02/2007 Persantine MV: low risk;  b. 11/2011 CABG x 3 (LIMA->LAD, VG->OM, VG->RCA);  c. 05/2016 MV: EF >65%, no isch/infarct, horiz ST dep in I, II, V5-V6.   Depression    Diverticulosis    Esophageal stricture    GERD (gastroesophageal reflux disease)    Hemorrhoids    Hiatal hernia    Hyperkalemia    a. ARB stopped due to this.   Hyperlipidemia    Hypertension    Hypertensive heart disease    Hypothyroidism    Major depressive disorder with anxious distress 09/05/2019   Mild cognitive impairment    a. seen by neurology.   Mild vascular neurocognitive disorder 09/05/2019   Myocardial  infarction ([place]) 12/07/2011   NSTEMI (non-ST elevated myocardial infarction) ([place]) 12/01/2011   PAF (paroxysmal atrial fibrillation) ([place])    a. post-op CABG.   Pain    RIGHT KNEE PAIN - TORN RIGHT MEDIAL MENISCUS   Paroxysmal atrial flutter ([place])    a. 07/2016 s/p TEE & DCCV;  b. 07/2016 Recurrent PAFlutter req initiation of amio & PPM in setting of tachy-brady;  c. CHA2DS2VASc = 7-->Xarelto 15 mg QD.   Pneumonia    "twice" (09/27/2018)   PONV (postoperative nausea and vomiting)    Presence of permanent  cardiac pacemaker 08/12/2016   S/P CABG (coronary artery bypass graft), 12/04/11 12/07/2011   LIMA to LAD, SVG to OM, SVG to RCA   Sinus bradycardia    a. not on BB due to this.   Skin cancer    "face" (09/27/2018)   Tachy-brady syndrome ([place])    a. 07/2016 Jxnl brady following DCCV, recurrent Aflutter-->amio + SJM 2272 Assurity MRI DC PPM (ser # [id number]).   Type II diabetes mellitus ([place])     Past Surgical History:  Procedure Laterality Date   ABDOMINAL HYSTERECTOMY  1980's   ANKLE FRACTURE SURGERY Right    "put pins both side right ankle"   BACK SURGERY  2006   "cyst growing near my spine"   CARDIAC CATHETERIZATION  12/02/2011   mild LV dysfunction with mod hypocontractility of mid-distal anterolateral wall; CAD w/ostial tapering of L Main with 50% diffuse ostial narrowing of LAD, 99% eccentric focal prox LAD stenosis followed by 70% prox LAD stenosis after 1st diag, 20% mid LAD narrowing; 80% ostial-to-prox L Cfx stenosis & 40-50% irregularity of RCA (Dr. Corky Downs)   CARDIOVERSION N/A 08/11/2016   Procedure: CARDIOVERSION;  Surgeon: Lelon Perla, MD;  Location: [facility];  Service: Cardiovascular;  Laterality: N/A;   CATARACT EXTRACTION W/ INTRAOCULAR LENS  IMPLANT, BILATERAL Bilateral ~ 2010   [place] GRAFT  12/04/2011   Procedure: CORONARY ARTERY BYPASS GRAFTING (CABG);  Surgeon: Tharon Aquas Adelene Idler, MD;  Location: [place];  Service: Open Heart Surgery;  Laterality: N/A;  CABG x three,  using left internal mammary artery, and right leg greater saphenous vein harvested endoscopically   CORONARY STENT INTERVENTION N/A 09/27/2018   Procedure: CORONARY STENT INTERVENTION;  Surgeon: Troy Sine, MD;  Location: [place] [facility];  Service: Cardiovascular;  Laterality: N/A;   DILATION AND CURETTAGE OF UTERUS     "a couple times"   EP IMPLANTABLE DEVICE N/A 08/12/2016   Procedure: Pacemaker Implant;  Surgeon: Will Meredith Leeds, MD;  Location:  [place] [facility];  Service: Cardiovascular;  Laterality: N/A;   ESOPHAGOGASTRODUODENOSCOPY (EGD) WITH ESOPHAGEAL DILATION     FRACTURE SURGERY     INTRAMEDULLARY (IM) NAIL INTERTROCHANTERIC Left 03/18/2022   Procedure: INTRAMEDULLARY (IM) NAIL INTERTROCHANTRIC;  Surgeon: Georgeanna Harrison, MD;  Location: [place];  Service: Orthopedics;  Laterality: Left;   JOINT REPLACEMENT     KNEE ARTHROSCOPY WITH MEDIAL MENISECTOMY Right 07/02/2014   Procedure: RIGHT KNEE ARTHROSCOPY WITH PARTIAL MEDIAL MENISTECTOMY, ABRASION CONDROPLASTYU OF PATELLA,ABRASION CONDROPLASTY OF MEDIAL FEMEROL CONDYL, MICROFRACTURE OF MEDIAL FEMEROL CONDYL;  Surgeon: Tobi Bastos, MD;  Location: [facility];  Service: Orthopedics;  Laterality: Right;   LEFT HEART CATH AND CORS/GRAFTS ANGIOGRAPHY N/A 09/06/2018   Procedure: LEFT HEART CATH AND CORS/GRAFTS ANGIOGRAPHY;  Surgeon: Troy Sine, MD;  Location: [place] [facility];  Service: Cardiovascular;  Laterality: N/A;   LEFT  HEART CATHETERIZATION WITH CORONARY ANGIOGRAM N/A 12/02/2011   Procedure: LEFT HEART CATHETERIZATION WITH CORONARY ANGIOGRAM;  Surgeon: Troy Sine, MD;  Location: [facility];  Service: Cardiovascular;  Laterality: N/A;  Coronary angiogram, possible PCI   TEE WITHOUT CARDIOVERSION N/A 08/11/2016   Procedure: TRANSESOPHAGEAL ECHOCARDIOGRAM (TEE);  Surgeon: Lelon Perla, MD;  Location[facility];  Service: Cardiovascular;  Laterality: N/A;   TEE WITHOUT CARDIOVERSION N/A 05/18/2019   Procedure: TRANSESOPHAGEAL ECHOCARDIOGRAM (TEE);  Surgeon: [name], MD;  Location: [facility];  Service: Cardiovascular;  Laterality: N/A;   [place] Left ~ 2006   TRANSTHORACIC ECHOCARDIOGRAM  02/19/2013   EF 96-28%, grade 1 diastolic dysfunction; mildly thickend/calcified AV leaflets; mildly calcidied MV annulus; mild TR    Social History  reports that she has never smoked. She has never used smokeless tobacco. She reports that  she does not currently use alcohol. She reports that she does not use drugs.  Allergies  Allergen Reactions   Clonidine Derivatives Other (See Comments)    Bradycardia and fatigue    Clonidine Hcl Other (See Comments)    Bradycardia   Crestor [Rosuvastatin] Other (See Comments)    Made the patient feel tired/weak   Losartan Potassium Other (See Comments)    Hyperkalemia   Sulfa Antibiotics Other (See Comments)    Childhood reaction not recalled   Epinephrine Other (See Comments)    Abnormal feeling. Dental exam/injection of local w/ epi.   Hydralazine Hcl Anxiety and Other (See Comments)    Nervousness, anxiousness, GI upset    Family History  Problem Relation Age of Onset   Diabetes Mother    CVA Mother    Hypertension Mother    Heart disease Father    Hyperlipidemia Father    Breast cancer Sister    Breast cancer Sister        x 3   Heart disease Brother        x5; one with MI   Heart disease Sister        x3   Diabetes Sister        x3   Lung cancer Sister    Breast cancer Sister    Colon cancer Neg Hx   Reviewed on admission  Prior to Admission medications   Medication Sig Start Date End Date Taking? Authorizing Provider  acetaminophen (TYLENOL) 650 MG CR tablet Take 650 mg by mouth every 8 (eight) hours as needed for pain.    [provider]  allopurinol (ZYLOPRIM) 100 MG tablet Take 200 mg by mouth daily.    [provider]  amLODipine (NORVASC) 5 MG tablet Take 1 tablet (5 mg total) by mouth daily. 09/14/21 03/18/22  [name], MD  aspirin EC 81 MG tablet Take 1 tablet (81 mg total) by mouth daily. 01/12/22   [place] Donnamarie Poag, DO  carvedilol (COREG) 25 MG tablet Take 1 tablet (25 mg total) by mouth 2 (two) times daily. 02/12/22 03/18/22  Dana Allan I, MD  diclofenac Sodium (VOLTAREN) 1 % GEL Apply 2 g topically 2 (two) times daily as needed (pain).    [provider]  docusate sodium (COLACE) 100 MG capsule Take 100 mg by  mouth in the morning and at bedtime.    [provider]  enoxaparin (LOVENOX) 30 MG/0.3ML injection Inject 0.3 mLs (30 mg total) into the skin daily. 03/19/22 04/30/22  Georgeanna Harrison, MD  ferrous sulfate 325 (65 FE) MG  tablet Take 1 tablet (325 mg total) by mouth 2 (two) times daily with a meal. 03/23/22   [place] Donnamarie Poag, DO  gabapentin (NEURONTIN) 300 MG capsule Take 1 capsule (300 mg total) by mouth at bedtime. 05/22/19   Roney Jaffe, MD  HYDROcodone-acetaminophen (NORCO/VICODIN) 5-325 MG tablet Take 1-2 tablets by mouth every 4 (four) hours as needed for moderate pain (pain score 4-6). 03/19/22   Georgeanna Harrison, MD  insulin glargine (LANTUS) 100 UNIT/ML injection Inject 0.15 mLs (15 Units total) into the skin daily. 03/23/22 06/21/22  [place] Donnamarie Poag, DO  isosorbide mononitrate (IMDUR) 30 MG 24 hr tablet Take 1 tablet (30 mg total) by mouth daily. 03/01/22   Hilty, Nadean Corwin, MD  meclizine (ANTIVERT) 25 MG tablet Take 25 mg by mouth daily as needed for dizziness.    [provider]  nitroGLYCERIN (NITROSTAT) 0.4 MG SL tablet Place 1 tablet (0.4 mg total) under the tongue every 5 (five) minutes as needed for chest pain (max 3 doses). 02/11/20 03/18/22  [name], MD  pantoprazole (PROTONIX) 40 MG tablet Take 40 mg by mouth daily before breakfast. 06/24/20   [provider]  Polyethyl Glycol-Propyl Glycol (SYSTANE OP) Place 1 drop into both eyes 2 (two) times daily as needed (dry eyes).    [provider]  polyethylene glycol (MIRALAX / GLYCOLAX) 17 g packet Take 17 g by mouth daily. Patient taking differently: Take 17 g by mouth daily as needed for mild constipation. 02/13/22   Dana Allan I, MD  rosuvastatin (CRESTOR) 10 MG tablet Take 10 mg by mouth at bedtime. 01/28/21   [provider]  sertraline (ZOLOFT) 25 MG tablet Take 25 mg by mouth daily. 02/08/22   [provider]  SYNTHROID 100 MCG tablet Take 100 mcg by mouth at bedtime. 03/26/21    [provider]    Physical Exam: Vitals:   04/21/22 1637 04/21/22 1638  BP:  (!) 109/49  Pulse:  63  Resp:  18  Temp:  98 F (36.7 C)  TempSrc:  Oral  SpO2:  93%  Weight: 58 kg   Height: 5' (1.524 m)     Physical Exam Constitutional:      General: She is not in acute distress.    Appearance: Normal appearance.  HENT:     Head: Normocephalic and atraumatic.     Mouth/Throat:     Mouth: Mucous membranes are moist.     Pharynx: Oropharynx is clear.  Eyes:     Extraocular Movements: Extraocular movements intact.     Pupils: Pupils are equal, round, and reactive to light.  Cardiovascular:     Rate and Rhythm: Normal rate and regular rhythm.     Pulses: Normal pulses.     Heart sounds: Normal heart sounds.  Pulmonary:     Effort: Pulmonary effort is normal. No respiratory distress.     Breath sounds: Normal breath sounds.  Abdominal:     General: Bowel sounds are normal. There is no distension.     Palpations: Abdomen is soft.     Tenderness: There is no abdominal tenderness.  Musculoskeletal:        General: No swelling or deformity.     Right lower leg: Edema present.     Left lower leg: Edema present.  Skin:    General: Skin is warm and dry.  Neurological:     General: No focal deficit present.     Mental Status: Mental status is at baseline.  Labs on Admission: I have personally reviewed following labs and imaging studies  CBC: Recent Labs  Lab 04/21/22 1847  WBC 8.9  NEUTROABS 7.3  HGB 7.5*  HCT 23.0*  MCV 100.4*  PLT 102    Basic Metabolic Panel: Recent Labs  Lab 04/21/22 1641  NA 131*  K 4.7  CL 99  CO2 20*  GLUCOSE 226*  BUN 100*  CREATININE 4.43*  CALCIUM 7.8*    GFR: Estimated Creatinine Clearance: 7.8 mL/min (A) (by C-G formula based on SCr of 4.43 mg/dL (H)).  Liver Function Tests: Recent Labs  Lab 04/21/22 1641  AST 38  ALT 25  ALKPHOS 217*  BILITOT 0.7  PROT 5.9*  ALBUMIN 2.9*    Urine analysis:     Component Value Date/Time   COLORURINE STRAW (A) 03/18/2022 0222   APPEARANCEUR CLEAR 03/18/2022 0222   LABSPEC 1.009 03/18/2022 0222   PHURINE 5.0 03/18/2022 0222   GLUCOSEU NEGATIVE 03/18/2022 0222   HGBUR NEGATIVE 03/18/2022 0222   BILIRUBINUR NEGATIVE 03/18/2022 0222   KETONESUR NEGATIVE 03/18/2022 0222   PROTEINUR 100 (A) 03/18/2022 0222   UROBILINOGEN 0.2 01/04/2012 1105   NITRITE NEGATIVE 03/18/2022 0222   LEUKOCYTESUR NEGATIVE 03/18/2022 0222    Radiological Exams on Admission: DG Chest 1 View  Result Date: 04/21/2022 CLINICAL DATA:  Provided history: Abnormal labs. EXAM: CHEST  1 VIEW COMPARISON:  Prior chest radiographs 02/10/2022 and earlier. FINDINGS: Left chest dual lead implantable cardiac device. Prior median sternotomy/CABG. Mild cardiomegaly. Aortic atherosclerosis. Ill-defined opacity within the medial right lung base. No appreciable pulmonary edema. No evidence of pleural effusion or pneumothorax. No acute bony abnormality identified. Chronic deformity of the proximal left humerus. IMPRESSION: Opacity within the medial right lung base suspicious for pneumonia. Followup PA and lateral chest radiographs are recommended in 3-4 weeks following trial of antibiotic therapy to ensure resolution and exclude underlying malignancy. Cardiomegaly. Aortic Atherosclerosis (ICD10-I70.0). Electronically Signed   By: Kellie Simmering D.O.   On: 04/21/2022 18:51    EKG: Independently reviewed.  Paced rhythm at 66 bpm.  Some baseline artifact.  Assessment/Plan Principal Problem:   Acute renal failure superimposed on stage 4 chronic kidney disease (HCC) Active Problems:   GERD   Insulin dependent type 2 diabetes mellitus ([place])   Hyperlipidemia associated with type 2 diabetes mellitus (HCC)   S/P CABG (coronary artery bypass graft), 12/04/11   Hypertension associated with diabetes ([place])   Hypothyroidism   S/P placement of cardiac pacemaker   SSS (sick sinus syndrome) (HCC)   Paroxysmal  atrial fibrillation ([place]) - not on systemic anticoagulation due to hx of GI bleeding   Coronary artery disease   Status post coronary artery stent placement   Anemia of chronic disease   Acute on chronic diastolic CHF (congestive heart failure) ([place])   [name] on CKD 4 Acute on chronic diastolic heart failure > Last echo in March with EF 55-60%, G2 DD, mildly reduced RV function. > Presenting from facility with abnormal elevated BUN.  Noted to have creatinine of 4.43 from baseline of around 2.6.  Also with increasing edema. > BNP elevated to greater than 1200.  Some mild changes on chest x-ray that could be consistent with acute on chronic CHF though no profound pulmonary edema.  Does have lower extremity edema (daughter reports developing since 2 weeks after leaving). > Daughter also reports that the facility has been starting and stopping and changing the dose of her diuretic at times and as per HPI did give fluids  this week but this appeared to worsen renal function further, so was sent for further evaluation. > Suspect [name] on CKD 4 in the setting of acute on chronic diastolic heart failure secondary to discontinuation of home diuretic, unclear over what time courses has been developing do not have access to all labs from facility. > 40 mg IV Lasix ordered in the ED, home diuretic was previously torsemide 20 mg daily. - Monitor on telemetry - Continue with diuresis with Lasix 40 mg IV twice daily Strict I's and O's, daily weights - Trend renal function and electrolytes - Add on magnesium - Renal diet with fluid restriction  Hyperlipidemia CAD status post CABG and stenting - Continue home rosuvastatin - Continue home carvedilol - Continue home Imdur - Continue home aspirin  GERD - Continue PPI  Paroxysmal A-fib Sick sinus syndrome Status post pacemaker > Currently with paced rhythm in the ED. - Continue to monitor  Anemia > Hemoglobin somewhat below baseline at 7.5 in the ED with  baseline being between 8 and 9.  Possible delusional component in the setting of volume overload as above. - Trend CBC  Diabetes Neuropathy - SSI - Continue home gabapentin when confirmed  Hypertension - Holding home amlodipine in the setting of low normal blood pressure and starting diuresis as above  Hypothyroid - Continue home Synthroid  Depression - Continue home sertraline  DVT prophylaxis: Lovenox Code Status:   DNR Family Communication:  Daughter updated by phone  Disposition Plan:   Patient is from:  [place] home  Anticipated DC to:  Same as above  Anticipated DC date:  1 to 3 days  Anticipated DC barriers: None  Consults called:  None Admission status:  Observation, telemetry  Severity of Illness: The appropriate patient status for this patient is OBSERVATION. Observation status is judged to be reasonable and necessary in order to provide the required intensity of service to ensure the patient's safety. The patient's presenting symptoms, physical exam findings, and initial radiographic and laboratory data in the context of their medical condition is felt to place them at decreased risk for further clinical deterioration. Furthermore, it is anticipated that the patient will be medically stable for discharge from the hospital within 2 midnights of admission.    Marcelyn Bruins MD [facility]  How to contact the [facility] Attending or Consulting provider [place] or covering provider during after hours [place], for this patient?   Check the care team in [facility] and look for a) attending/consulting [facility] provider listed and b) the [facility] team listed Log into [email or web address] and use [place]'s universal password to access. If you do not have the password, please contact the hospital operator. Locate the [facility] provider you are looking for under [facility] and page to a number that you can be directly reached. If you still have difficulty reaching the provider, please  page the [facility] (Director on Call) for the Hospitalists listed on amion for assistance.  04/21/2022, 8:46 PM

## 2022-04-21 NOTE — ED Provider Notes (Signed)
[facility] DEPT Provider Note   CSN: [id number] Arrival date & time: 04/21/22  1627     History  No chief complaint on file.   [name] is a 81 y.o. female.  Pt is a 81y/o female with hx of paroxysmal atrial fibrillation, SSS s/p PPM, CAD s/p CABG x3, type 2 diabetes mellitus, hypothyroidism, hyperlipidemia, chronic diastolic congestive heart failure, essential hypertension, CKD stage IV hyperlipidemia history of GI bleed, and recent left hip fracture status postrepair at the end of May who is currently at rehab facility presenting today due to an abnormal lab.  They reported her BUN was 97 based on labs drawn in [place].  Patient at this time has no complaints other than some pain in her hip where she had the surgery but reports she does not want any pain medication at this time.  She denies any chest pain or shortness of breath.  She has noted that she has had swelling in her legs but cannot give a timeframe that it has been swollen.  Based on external medical records she has been evaluated for the swelling and recently had Doppler scans which were negative for DVT.  The history is provided by the patient and medical records.       Home Medications Prior to Admission medications   Medication Sig Start Date End Date Taking? Authorizing Provider  acetaminophen (TYLENOL) 650 MG CR tablet Take 650 mg by mouth every 8 (eight) hours as needed for pain.    [provider]  allopurinol (ZYLOPRIM) 100 MG tablet Take 200 mg by mouth daily.    [provider]  amLODipine (NORVASC) 5 MG tablet Take 1 tablet (5 mg total) by mouth daily. 09/14/21 03/18/22  [name], MD  aspirin EC 81 MG tablet Take 1 tablet (81 mg total) by mouth daily. 01/12/22   [place] Donnamarie Poag, DO  carvedilol (COREG) 25 MG tablet Take 1 tablet (25 mg total) by mouth 2 (two) times daily. 02/12/22 03/18/22  Dana Allan I, MD  diclofenac Sodium (VOLTAREN) 1 % GEL  Apply 2 g topically 2 (two) times daily as needed (pain).    [provider]  docusate sodium (COLACE) 100 MG capsule Take 100 mg by mouth in the morning and at bedtime.    [provider]  enoxaparin (LOVENOX) 30 MG/0.3ML injection Inject 0.3 mLs (30 mg total) into the skin daily. 03/19/22 04/30/22  Georgeanna Harrison, MD  ferrous sulfate 325 (65 FE) MG tablet Take 1 tablet (325 mg total) by mouth 2 (two) times daily with a meal. 03/23/22   [place] Donnamarie Poag, DO  gabapentin (NEURONTIN) 300 MG capsule Take 1 capsule (300 mg total) by mouth at bedtime. 05/22/19   Roney Jaffe, MD  HYDROcodone-acetaminophen (NORCO/VICODIN) 5-325 MG tablet Take 1-2 tablets by mouth every 4 (four) hours as needed for moderate pain (pain score 4-6). 03/19/22   Georgeanna Harrison, MD  insulin glargine (LANTUS) 100 UNIT/ML injection Inject 0.15 mLs (15 Units total) into the skin daily. 03/23/22 06/21/22  [place] Donnamarie Poag, DO  isosorbide mononitrate (IMDUR) 30 MG 24 hr tablet Take 1 tablet (30 mg total) by mouth daily. 03/01/22   Hilty, Nadean Corwin, MD  meclizine (ANTIVERT) 25 MG tablet Take 25 mg by mouth daily as needed for dizziness.    [provider]  nitroGLYCERIN (NITROSTAT) 0.4 MG SL tablet Place 1 tablet (0.4 mg total) under the tongue every 5 (five) minutes as needed for chest pain (max  3 doses). 02/11/20 03/18/22  [name], MD  pantoprazole (PROTONIX) 40 MG tablet Take 40 mg by mouth daily before breakfast. 06/24/20   [provider]  Polyethyl Glycol-Propyl Glycol (SYSTANE OP) Place 1 drop into both eyes 2 (two) times daily as needed (dry eyes).    [provider]  polyethylene glycol (MIRALAX / GLYCOLAX) 17 g packet Take 17 g by mouth daily. Patient taking differently: Take 17 g by mouth daily as needed for mild constipation. 02/13/22   Dana Allan I, MD  rosuvastatin (CRESTOR) 10 MG tablet Take 10 mg by mouth at bedtime. 01/28/21   [provider]  sertraline (ZOLOFT) 25  MG tablet Take 25 mg by mouth daily. 02/08/22   [provider]  SYNTHROID 100 MCG tablet Take 100 mcg by mouth at bedtime. 03/26/21   [provider]      Allergies    Clonidine derivatives, Clonidine hcl, Crestor [rosuvastatin], Losartan potassium, Sulfa antibiotics, Epinephrine, and Hydralazine hcl    Review of Systems   Review of Systems  Physical Exam Updated Vital Signs BP (!) 109/49 (BP Location: Right Arm)   Pulse 63   Temp 98 F (36.7 C) (Oral)   Resp 18   Ht 5' (1.524 m)   Wt 58 kg   SpO2 93%   BMI 24.97 kg/m  Physical Exam Vitals reviewed.  Constitutional:      General: She is not in acute distress.    Appearance: Normal appearance.  HENT:     Head: Normocephalic.  Eyes:     Pupils: Pupils are equal, round, and reactive to light.  Cardiovascular:     Rate and Rhythm: Normal rate.  Pulmonary:     Effort: Pulmonary effort is normal. No respiratory distress.  Abdominal:     General: Abdomen is flat.     Palpations: Abdomen is soft. There is no mass.     Tenderness: There is no abdominal tenderness. There is no guarding.  Musculoskeletal:     Right lower leg: Edema present.     Left lower leg: Edema present.     Comments: 3+ pitting edema bilateral lower extremities right greater than left.  Healing ecchymosis present over the left knee  Skin:    General: Skin is warm.  Neurological:     Mental Status: She is alert. Mental status is at baseline.  Psychiatric:        Mood and Affect: Mood normal.        Behavior: Behavior normal.     ED Results / Procedures / Treatments   Labs (all labs ordered are listed, but only abnormal results are displayed) Labs Reviewed  COMPREHENSIVE METABOLIC PANEL - Abnormal; Notable for the following components:      Result Value   Sodium 131 (*)    CO2 20 (*)    Glucose, Bld 226 (*)    BUN 100 (*)    Creatinine, Ser 4.43 (*)    Calcium 7.8 (*)    Total Protein 5.9 (*)    Albumin 2.9 (*)    Alkaline  Phosphatase 217 (*)    GFR, Estimated 9 (*)    All other components within normal limits  BRAIN NATRIURETIC PEPTIDE - Abnormal; Notable for the following components:   B Natriuretic Peptide 1,241.0 (*)    All other components within normal limits  CBC WITH DIFFERENTIAL/PLATELET - Abnormal; Notable for the following components:   RBC 2.29 (*)    Hemoglobin 7.5 (*)  HCT 23.0 (*)    MCV 100.4 (*)    RDW 17.3 (*)    Lymphs Abs 0.6 (*)    All other components within normal limits  TROPONIN I (HIGH SENSITIVITY) - Abnormal; Notable for the following components:   Troponin I (High Sensitivity) 25 (*)    All other components within normal limits  TROPONIN I (HIGH SENSITIVITY)    EKG EKG Interpretation  Date/Time:  Wednesday April 21 2022 19:23:55 EDT Ventricular Rate:  66 PR Interval:  276 QRS Duration: 92 QT Interval:  430 QTC Calculation: 450 R Axis:   100 Text Interpretation: Atrial-paced rhythm with prolonged AV conduction Rightward axis Septal infarct , age undetermined No significant change since last tracing When compared with ECG of 18-Mar-2022 13:03, PREVIOUS ECG IS PRESENT Confirmed by Blanchie Dessert [phone]) on 04/21/2022 7:52:34 PM  Radiology DG Chest 1 View  Result Date: 04/21/2022 CLINICAL DATA:  Provided history: Abnormal labs. EXAM: CHEST  1 VIEW COMPARISON:  Prior chest radiographs 02/10/2022 and earlier. FINDINGS: Left chest dual lead implantable cardiac device. Prior median sternotomy/CABG. Mild cardiomegaly. Aortic atherosclerosis. Ill-defined opacity within the medial right lung base. No appreciable pulmonary edema. No evidence of pleural effusion or pneumothorax. No acute bony abnormality identified. Chronic deformity of the proximal left humerus. IMPRESSION: Opacity within the medial right lung base suspicious for pneumonia. Followup PA and lateral chest radiographs are recommended in 3-4 weeks following trial of antibiotic therapy to ensure resolution and exclude  underlying malignancy. Cardiomegaly. Aortic Atherosclerosis (ICD10-I70.0). Electronically Signed   By: Kellie Simmering D.O.   On: 04/21/2022 18:51    Procedures Procedures    Medications Ordered in ED Medications  furosemide (LASIX) injection 40 mg (has no administration in time range)    ED Course/ Medical Decision Making/ A&P                           Medical Decision Making Amount and/or Complexity of Data Reviewed External Data Reviewed: notes. Labs: ordered. Decision-making details documented in ED Course. Radiology: ordered and independent interpretation performed. Decision-making details documented in ED Course. ECG/medicine tests: ordered and independent interpretation performed. Decision-making details documented in ED Course.  Risk Decision regarding hospitalization.   Pt with multiple medical problems and comorbidities and presenting today with a complaint that caries a high risk for morbidity and mortality.  Presenting today from her nursing home due to an abnormal lab test with a BUN of 97.  When looking back on her external medical records from her recent hospitalization that seem to be patient baseline with a BUN between 80-90 and she did have a short bout of renal insufficiency due to diuresis and her torsemide was held on discharge.  Typical renal function is 2.2-2.4 and she was 2.2 when leaving the hospital.  The lack of torsemide is most likely why she is having some swelling in her legs.  She has been evaluated for DVT which was negative.  Patient reports that she does not know why she is here and is having some pain in her left hip from where the surgery was done but otherwise has no complaints.  We will recheck renal function today.  8:21 PM I independently interpreted patient's labs today and she has a new [name] with creatinine of 4.43 with BUN of 100, mild hyponatremia of 131 and normal LFTs.  Given these findings with a new [name] unclear if it is related to fluid  overload and hepatorenal syndrome.  She has been taken off of her diuretic.  LFTs however are normal.  CBC, troponin, EKG and chest x-ray are pending.  Feel that patient will most likely require admission given new [name] and worsening swelling in her lower extremities.  8:21 PM Patient's EKG shows a paced rhythm without acute findings, CBC shows normal white count, hemoglobin of 7.5 which is a 1 g drop from her last labs done prior to discharge, troponin elevated at 25 which is not significantly different, BNP elevated at 1241 which is similar to prior. I have independently visualized and interpreted pt's images today. Chest x-ray today shows cardiomegaly and what appears to be vascular congestion, radiology reports opacity within the medial right lung base suspicious for pneumonia.  However patient did have a wet sounding cough on exam but denied any shortness of breath or sputum production.  Concern for fluid overload given patient's exam and labs.  Also concern for new [name].  Will discuss with hospitalist for admission and possibly restarting diuretic.  Pt given lasix '40mg'$   CRITICAL CARE Performed by: Chastelyn Athens Total critical care time: 30 minutes Critical care time was exclusive of separately billable procedures and treating other patients. Critical care was necessary to treat or prevent imminent or life-threatening deterioration. Critical care was time spent personally by me on the following activities: development of treatment plan with patient and/or surrogate as well as nursing, discussions with consultants, evaluation of patient's response to treatment, examination of patient, obtaining history from patient or surrogate, ordering and performing treatments and interventions, ordering and review of laboratory studies, ordering and review of radiographic studies, pulse oximetry and re-evaluation of patient's condition.           Final Clinical Impression(s) / ED Diagnoses Final  diagnoses:  Congestive heart failure, unspecified HF chronicity, unspecified heart failure type ([place])  Acute kidney injury [facility])    Rx / DC Orders ED Discharge Orders     None         Blanchie Dessert, MD 04/21/22 2021

## 2022-04-21 NOTE — ED Notes (Signed)
ED TO INPATIENT HANDOFF REPORT  ED Nurse Name and Phone #: Elpidio Eric [phone]   S Name/Age/Gender [name] 81 y.o. female Room/Bed: WHALC/WHALC  Code Status   Code Status: DNR  Home/SNF/Other Nursing Home Patient oriented to: self, place, time, and situation Is this baseline? Yes   Triage Complete: Triage complete  Chief Complaint Acute renal failure superimposed on stage 4 chronic kidney disease ([place]) [N17.9, N18.4]  Triage Note Patient BIB EMS from [facility] in [place] for abnormal labs, BUN of 97.7.   Allergies Allergies  Allergen Reactions   Clonidine Derivatives Other (See Comments)    Bradycardia and fatigue    Crestor [Rosuvastatin] Other (See Comments)    Made the patient feel tired/weak   Losartan Potassium Other (See Comments)    Hyperkalemia   Sulfa Antibiotics Other (See Comments)    Childhood reaction not recalled   Epinephrine Other (See Comments)    Abnormal feeling. Dental exam/injection of local w/ epi.   Hydralazine Other (See Comments)    Nausea/gi upset    Hydralazine Hcl Anxiety and Other (See Comments)    Nervousness, anxiousness, GI upset    Level of Care/Admitting Diagnosis ED Disposition     ED Disposition  Admit   Condition  --   Comment  Hospital Area: [facility] [[id number]]  Level of Care: Telemetry [5]  Admit to tele based on following criteria: Acute CHF  May place patient in observation at [facility] or [place] if equivalent level of care is available:: No  Covid Evaluation: Asymptomatic - no recent exposure (last 10 days) testing not required  Diagnosis: Acute renal failure superimposed on stage 4 chronic kidney disease [facility]) [[id number]]  Admitting Physician: Marcelyn Bruins [[id number]]  Attending Physician: Marcelyn Bruins [[id number]]          B Medical/Surgery History Past Medical History:  Diagnosis Date   Acute medial meniscus tear of right knee 07/02/2014   Acute  renal failure,admitted 01/03/12, after admission for diastolic chf 75/07/2584   Anemia    Anxiety    Arthritis    "in my hands; knees, back" (09/27/2018)   CAD (coronary artery disease)    a. 40-59% bilaterally 10/2015.   CAD S/P percutaneous coronary angioplasty 09/27/2018   S/P SVG-OM PCI with DES 09/27/18   Chronic diastolic CHF (congestive heart failure) ([place])    a. 05/2016 Echo: EF 60-65%, no rwma, Gr1 DD, Ao sclerosis w/o stenosis, triv MR;  b. 07/2016 TEE: EF 55-60%, no rwma, mild MR.   Chronic headaches    CKD (chronic kidney disease), stage III (HCC)    Coronary artery disease    a. 02/2007 Persantine MV: low risk;  b. 11/2011 CABG x 3 (LIMA->LAD, VG->OM, VG->RCA);  c. 05/2016 MV: EF >65%, no isch/infarct, horiz ST dep in I, II, V5-V6.   Depression    Diverticulosis    Esophageal stricture    GERD (gastroesophageal reflux disease)    Hemorrhoids    Hiatal hernia    Hyperkalemia    a. ARB stopped due to this.   Hyperlipidemia    Hypertension    Hypertensive heart disease    Hypothyroidism    Major depressive disorder with anxious distress 09/05/2019   Mild cognitive impairment    a. seen by neurology.   Mild vascular neurocognitive disorder 09/05/2019   Myocardial infarction [facility]) 12/07/2011   NSTEMI (non-ST elevated myocardial infarction) ([place]) 12/01/2011   PAF (paroxysmal atrial fibrillation) ([place])  a. post-op CABG.   Pain    RIGHT KNEE PAIN - TORN RIGHT MEDIAL MENISCUS   Paroxysmal atrial flutter ([place])    a. 07/2016 s/p TEE & DCCV;  b. 07/2016 Recurrent PAFlutter req initiation of amio & PPM in setting of tachy-brady;  c. CHA2DS2VASc = 7-->Xarelto 15 mg QD.   Pneumonia    "twice" (09/27/2018)   PONV (postoperative nausea and vomiting)    Presence of permanent cardiac pacemaker 08/12/2016   S/P CABG (coronary artery bypass graft), 12/04/11 12/07/2011   LIMA to LAD, SVG to OM, SVG to RCA   Sinus bradycardia    a. not on BB due to this.   Skin cancer    "face"  (09/27/2018)   Tachy-brady syndrome ([place])    a. 07/2016 Jxnl brady following DCCV, recurrent Aflutter-->amio + SJM 2272 Assurity MRI DC PPM (ser # [id number]).   Type II diabetes mellitus ([place])    Past Surgical History:  Procedure Laterality Date   ABDOMINAL HYSTERECTOMY  1980's   ANKLE FRACTURE SURGERY Right    "put pins both side right ankle"   BACK SURGERY  2006   "cyst growing near my spine"   CARDIAC CATHETERIZATION  12/02/2011   mild LV dysfunction with mod hypocontractility of mid-distal anterolateral wall; CAD w/ostial tapering of L Main with 50% diffuse ostial narrowing of LAD, 99% eccentric focal prox LAD stenosis followed by 70% prox LAD stenosis after 1st diag, 20% mid LAD narrowing; 80% ostial-to-prox L Cfx stenosis & 40-50% irregularity of RCA (Dr. Corky Downs)   CARDIOVERSION N/A 08/11/2016   Procedure: CARDIOVERSION;  Surgeon: Lelon Perla, MD;  Location: [facility];  Service: Cardiovascular;  Laterality: N/A;   CATARACT EXTRACTION W/ INTRAOCULAR LENS  IMPLANT, BILATERAL Bilateral ~ 2010   [place] GRAFT  12/04/2011   Procedure: CORONARY ARTERY BYPASS GRAFTING (CABG);  Surgeon: Tharon Aquas Adelene Idler, MD;  Location: [facility];  Service: Open Heart Surgery;  Laterality: N/A;  CABG x three,  using left internal mammary artery, and right leg greater saphenous vein harvested endoscopically   CORONARY STENT INTERVENTION N/A 09/27/2018   Procedure: CORONARY STENT INTERVENTION;  Surgeon: Troy Sine, MD;  Location: [facility] CV LAB;  Service: Cardiovascular;  Laterality: N/A;   DILATION AND CURETTAGE OF UTERUS     "a couple times"   EP IMPLANTABLE DEVICE N/A 08/12/2016   Procedure: Pacemaker Implant;  Surgeon: Will Meredith Leeds, MD;  Location: [facility] CV [facility];  Service: Cardiovascular;  Laterality: N/A;   ESOPHAGOGASTRODUODENOSCOPY (EGD) WITH ESOPHAGEAL DILATION     FRACTURE SURGERY     INTRAMEDULLARY (IM) NAIL INTERTROCHANTERIC Left 03/18/2022    Procedure: INTRAMEDULLARY (IM) NAIL INTERTROCHANTRIC;  Surgeon: Georgeanna Harrison, MD;  Location: [facility];  Service: Orthopedics;  Laterality: Left;   JOINT REPLACEMENT     KNEE ARTHROSCOPY WITH MEDIAL MENISECTOMY Right 07/02/2014   Procedure: RIGHT KNEE ARTHROSCOPY WITH PARTIAL MEDIAL MENISTECTOMY, ABRASION CONDROPLASTYU OF PATELLA,ABRASION CONDROPLASTY OF MEDIAL FEMEROL CONDYL, MICROFRACTURE OF MEDIAL FEMEROL CONDYL;  Surgeon: Tobi Bastos, MD;  Location: [facility];  Service: Orthopedics;  Laterality: Right;   LEFT HEART CATH AND CORS/GRAFTS ANGIOGRAPHY N/A 09/06/2018   Procedure: LEFT HEART CATH AND CORS/GRAFTS ANGIOGRAPHY;  Surgeon: Troy Sine, MD;  Location: [facility] CV LAB;  Service: Cardiovascular;  Laterality: N/A;   LEFT HEART CATHETERIZATION WITH CORONARY ANGIOGRAM N/A 12/02/2011   Procedure: LEFT HEART CATHETERIZATION WITH CORONARY ANGIOGRAM;  Surgeon: Troy Sine, MD;  Location: [facility] CATH LAB;  Service: Cardiovascular;  Laterality: N/A;  Coronary angiogram, possible PCI   TEE WITHOUT CARDIOVERSION N/A 08/11/2016   Procedure: TRANSESOPHAGEAL ECHOCARDIOGRAM (TEE);  Surgeon: Lelon Perla, MD;  Location: [facility] END[facility];  Service: Cardiovascular;  Laterality: N/A;   TEE WITHOUT CARDIOVERSION N/A 05/18/2019   Procedure: TRANSESOPHAGEAL ECHOCARDIOGRAM (TEE);  Surgeon: [name], MD;  Location: [facility] ENDOSCOPY;  Service: Cardiovascular;  Laterality: N/A;   [place] Left ~ 2006   TRANSTHORACIC ECHOCARDIOGRAM  02/19/2013   EF 40-98%, grade 1 diastolic dysfunction; mildly thickend/calcified AV leaflets; mildly calcidied MV annulus; mild TR     A IV Location/Drains/Wounds Patient Lines/Drains/Airways Status     Active Line/Drains/Airways     Name Placement date Placement time Site Days   Peripheral IV 03/20/22 22 G 1" Right;Anterior Forearm 03/20/22  2102  Forearm  32   Peripheral IV 03/22/22 20 G 1.88" Left;Lateral Forearm 03/22/22  2303   Forearm  30   External Urinary Catheter 02/11/22  0940  --  69   Incision (Closed) 03/18/22 Thigh Left;Proximal;Lateral 03/18/22  1701  -- 34   Incision (Closed) 03/18/22 Thigh Left;Lateral;Distal 03/18/22  1710  -- 34   Incision (Closed) 03/18/22 Hip Left 03/18/22  1711  -- 34            Intake/Output Last 24 hours No intake or output data in the 24 hours ending 04/21/22 2217  Labs/Imaging Results for orders placed or performed during the hospital encounter of 04/21/22 (from the past 48 hour(s))  Comprehensive metabolic panel     Status: Abnormal   Collection Time: 04/21/22  4:41 PM  Result Value Ref Range   Sodium 131 (L) 135 - 145 mmol/L   Potassium 4.7 3.5 - 5.1 mmol/L   Chloride 99 98 - 111 mmol/L   CO2 20 (L) 22 - 32 mmol/L   Glucose, Bld 226 (H) 70 - 99 mg/dL    Comment: Glucose reference range applies only to samples taken after fasting for at least 8 hours.   BUN 100 (H) 8 - 23 mg/dL    Comment: RESULTS CONFIRMED BY MANUAL DILUTION   Creatinine, Ser 4.43 (H) 0.44 - 1.00 mg/dL   Calcium 7.8 (L) 8.9 - 10.3 mg/dL   Total Protein 5.9 (L) 6.5 - 8.1 g/dL   Albumin 2.9 (L) 3.5 - 5.0 g/dL   AST 38 15 - 41 U/L   ALT 25 0 - 44 U/L   Alkaline Phosphatase 217 (H) 38 - 126 U/L   Total Bilirubin 0.7 0.3 - 1.2 mg/dL   GFR, Estimated 9 (L) >60 mL/min    Comment: (NOTE) Calculated using the CKD-EPI Creatinine Equation (2021)    Anion gap 12 5 - 15    Comment: Performed at [facility], [place] [address] [place], [place] [address]  Brain natriuretic peptide     Status: Abnormal   Collection Time: 04/21/22  6:21 PM  Result Value Ref Range   B Natriuretic Peptide 1,241.0 (H) 0.0 - 100.0 pg/mL    Comment: Performed at [facility], [place] [address], [place], [place] [address]  CBC with Differential/Platelet     Status: Abnormal   Collection Time: 04/21/22  6:47 PM  Result Value Ref Range   WBC 8.9 4.0 - 10.5 K/uL   RBC 2.29 (L) 3.87 - 5.11  MIL/uL   Hemoglobin 7.5 (L) 12.0 - 15.0 g/dL   HCT 23.0 (L) 36.0 -  46.0 %   MCV 100.4 (H) 80.0 - 100.0 fL   MCH 32.8 26.0 - 34.0 pg   MCHC 32.6 30.0 - 36.0 g/dL   RDW 17.3 (H) 11.5 - 15.5 %   Platelets 153 150 - 400 K/uL   nRBC 0.0 0.0 - 0.2 %   Neutrophils Relative % 83 %   Neutro Abs 7.3 1.7 - 7.7 K/uL   Lymphocytes Relative 6 %   Lymphs Abs 0.6 (L) 0.7 - 4.0 K/uL   Monocytes Relative 11 %   Monocytes Absolute 1.0 0.1 - 1.0 K/uL   Eosinophils Relative 0 %   Eosinophils Absolute 0.0 0.0 - 0.5 K/uL   Basophils Relative 0 %   Basophils Absolute 0.0 0.0 - 0.1 K/uL   Immature Granulocytes 0 %   Abs Immature Granulocytes 0.04 0.00 - 0.07 K/uL    Comment: Performed at [facility], [place] [address] [place], [place] [address]  Troponin I (High Sensitivity)     Status: Abnormal   Collection Time: 04/21/22  6:47 PM  Result Value Ref Range   Troponin I (High Sensitivity) 25 (H) <18 ng/L    Comment: (NOTE) Elevated high sensitivity troponin I (hsTnI) values and significant  changes across serial measurements may suggest ACS but many other  chronic and acute conditions are known to elevate hsTnI results.  Refer to the "Links" section for chest pain algorithms and additional  guidance. Performed at [facility], [place] [address] [place],  [address]    DG Chest 1 View  Result Date: 04/21/2022 CLINICAL DATA:  Provided history: Abnormal labs. EXAM: CHEST  1 VIEW COMPARISON:  Prior chest radiographs 02/10/2022 and earlier. FINDINGS: Left chest dual lead implantable cardiac device. Prior median sternotomy/CABG. Mild cardiomegaly. Aortic atherosclerosis. Ill-defined opacity within the medial right lung base. No appreciable pulmonary edema. No evidence of pleural effusion or pneumothorax. No acute bony abnormality identified. Chronic deformity of the proximal left humerus. IMPRESSION: Opacity within the medial right lung base suspicious for pneumonia.  Followup PA and lateral chest radiographs are recommended in 3-4 weeks following trial of antibiotic therapy to ensure resolution and exclude underlying malignancy. Cardiomegaly. Aortic Atherosclerosis (ICD10-I70.0). Electronically Signed   By: Kellie Simmering D.O.   On: 04/21/2022 18:51    Pending Labs Unresulted Labs (From admission, onward)     Start     Ordered   04/28/22 0500  Creatinine, serum  (enoxaparin (LOVENOX)    CrCl < 30 ml/min)  Once,   R       Comments: while on enoxaparin therapy.    04/21/22 2022   04/22/22 0500  Comprehensive metabolic panel  Tomorrow morning,   R        04/21/22 2022   04/22/22 0500  CBC  Tomorrow morning,   R        04/21/22 2022   04/21/22 2019  Magnesium  Add-on,   AD        04/21/22 2022            Vitals/Pain Today's Vitals   04/21/22 1637 04/21/22 1638  BP:  (!) 109/49  Pulse:  63  Resp:  18  Temp:  98 F (36.7 C)  TempSrc:  Oral  SpO2:  93%  Weight: 58 kg   Height: 5' (1.524 m)   PainSc: 6      Isolation Precautions No active isolations  Medications Medications  furosemide (LASIX) injection 40 mg (has no administration in time range)  aspirin  EC tablet 81 mg (has no administration in time range)  rosuvastatin (CRESTOR) tablet 10 mg (has no administration in time range)  levothyroxine (SYNTHROID) tablet 100 mcg (has no administration in time range)  pantoprazole (PROTONIX) EC tablet 40 mg (has no administration in time range)  enoxaparin (LOVENOX) injection 30 mg (has no administration in time range)  furosemide (LASIX) injection 40 mg (has no administration in time range)  sodium chloride flush (NS) 0.9 % injection 3 mL (has no administration in time range)  acetaminophen (TYLENOL) tablet 650 mg (has no administration in time range)    Or  acetaminophen (TYLENOL) suppository 650 mg (has no administration in time range)  polyethylene glycol (MIRALAX / GLYCOLAX) packet 17 g (has no administration in time range)  carvedilol  (COREG) tablet 25 mg (has no administration in time range)  isosorbide mononitrate (IMDUR) 24 hr tablet 30 mg (has no administration in time range)  sertraline (ZOLOFT) tablet 25 mg (has no administration in time range)  insulin aspart (novoLOG) injection 0-9 Units (has no administration in time range)    Mobility manual wheelchair Moderate fall risk   Focused Assessments    R Recommendations: See Admitting Provider Note  Report given to:   Additional Notes:

## 2022-04-22 ENCOUNTER — Observation Stay (HOSPITAL_COMMUNITY): Payer: Medicare Other

## 2022-04-22 DIAGNOSIS — E1140 Type 2 diabetes mellitus with diabetic neuropathy, unspecified: Secondary | ICD-10-CM | POA: Diagnosis present

## 2022-04-22 DIAGNOSIS — N179 Acute kidney failure, unspecified: Secondary | ICD-10-CM | POA: Diagnosis present

## 2022-04-22 DIAGNOSIS — R040 Epistaxis: Secondary | ICD-10-CM | POA: Diagnosis not present

## 2022-04-22 DIAGNOSIS — Z66 Do not resuscitate: Secondary | ICD-10-CM | POA: Diagnosis not present

## 2022-04-22 DIAGNOSIS — E8779 Other fluid overload: Secondary | ICD-10-CM | POA: Diagnosis not present

## 2022-04-22 DIAGNOSIS — I25709 Atherosclerosis of coronary artery bypass graft(s), unspecified, with unspecified angina pectoris: Secondary | ICD-10-CM | POA: Diagnosis not present

## 2022-04-22 DIAGNOSIS — I480 Paroxysmal atrial fibrillation: Secondary | ICD-10-CM | POA: Diagnosis present

## 2022-04-22 DIAGNOSIS — E871 Hypo-osmolality and hyponatremia: Secondary | ICD-10-CM | POA: Diagnosis present

## 2022-04-22 DIAGNOSIS — D638 Anemia in other chronic diseases classified elsewhere: Secondary | ICD-10-CM | POA: Diagnosis not present

## 2022-04-22 DIAGNOSIS — E1159 Type 2 diabetes mellitus with other circulatory complications: Secondary | ICD-10-CM | POA: Diagnosis not present

## 2022-04-22 DIAGNOSIS — Z955 Presence of coronary angioplasty implant and graft: Secondary | ICD-10-CM | POA: Diagnosis not present

## 2022-04-22 DIAGNOSIS — K219 Gastro-esophageal reflux disease without esophagitis: Secondary | ICD-10-CM | POA: Diagnosis present

## 2022-04-22 DIAGNOSIS — F0393 Unspecified dementia, unspecified severity, with mood disturbance: Secondary | ICD-10-CM | POA: Diagnosis present

## 2022-04-22 DIAGNOSIS — Z7189 Other specified counseling: Secondary | ICD-10-CM | POA: Diagnosis not present

## 2022-04-22 DIAGNOSIS — Z6825 Body mass index (BMI) 25.0-25.9, adult: Secondary | ICD-10-CM | POA: Diagnosis not present

## 2022-04-22 DIAGNOSIS — E039 Hypothyroidism, unspecified: Secondary | ICD-10-CM | POA: Diagnosis present

## 2022-04-22 DIAGNOSIS — Z950 Presence of cardiac pacemaker: Secondary | ICD-10-CM | POA: Diagnosis not present

## 2022-04-22 DIAGNOSIS — Z515 Encounter for palliative care: Secondary | ICD-10-CM | POA: Diagnosis not present

## 2022-04-22 DIAGNOSIS — I2510 Atherosclerotic heart disease of native coronary artery without angina pectoris: Secondary | ICD-10-CM | POA: Diagnosis not present

## 2022-04-22 DIAGNOSIS — I495 Sick sinus syndrome: Secondary | ICD-10-CM | POA: Diagnosis present

## 2022-04-22 DIAGNOSIS — E440 Moderate protein-calorie malnutrition: Secondary | ICD-10-CM | POA: Diagnosis present

## 2022-04-22 DIAGNOSIS — K2100 Gastro-esophageal reflux disease with esophagitis, without bleeding: Secondary | ICD-10-CM | POA: Diagnosis not present

## 2022-04-22 DIAGNOSIS — E785 Hyperlipidemia, unspecified: Secondary | ICD-10-CM | POA: Diagnosis present

## 2022-04-22 DIAGNOSIS — E1122 Type 2 diabetes mellitus with diabetic chronic kidney disease: Secondary | ICD-10-CM | POA: Diagnosis present

## 2022-04-22 DIAGNOSIS — I5030 Unspecified diastolic (congestive) heart failure: Secondary | ICD-10-CM | POA: Diagnosis not present

## 2022-04-22 DIAGNOSIS — E119 Type 2 diabetes mellitus without complications: Secondary | ICD-10-CM | POA: Diagnosis not present

## 2022-04-22 DIAGNOSIS — I130 Hypertensive heart and chronic kidney disease with heart failure and stage 1 through stage 4 chronic kidney disease, or unspecified chronic kidney disease: Secondary | ICD-10-CM | POA: Diagnosis present

## 2022-04-22 DIAGNOSIS — I5033 Acute on chronic diastolic (congestive) heart failure: Secondary | ICD-10-CM | POA: Diagnosis present

## 2022-04-22 DIAGNOSIS — D649 Anemia, unspecified: Secondary | ICD-10-CM | POA: Diagnosis not present

## 2022-04-22 DIAGNOSIS — E872 Acidosis, unspecified: Secondary | ICD-10-CM | POA: Diagnosis not present

## 2022-04-22 DIAGNOSIS — I152 Hypertension secondary to endocrine disorders: Secondary | ICD-10-CM | POA: Diagnosis present

## 2022-04-22 DIAGNOSIS — N184 Chronic kidney disease, stage 4 (severe): Secondary | ICD-10-CM | POA: Diagnosis present

## 2022-04-22 DIAGNOSIS — E1169 Type 2 diabetes mellitus with other specified complication: Secondary | ICD-10-CM | POA: Diagnosis present

## 2022-04-22 DIAGNOSIS — R531 Weakness: Secondary | ICD-10-CM | POA: Diagnosis not present

## 2022-04-22 DIAGNOSIS — N390 Urinary tract infection, site not specified: Secondary | ICD-10-CM | POA: Diagnosis present

## 2022-04-22 DIAGNOSIS — Z951 Presence of aortocoronary bypass graft: Secondary | ICD-10-CM | POA: Diagnosis not present

## 2022-04-22 DIAGNOSIS — D631 Anemia in chronic kidney disease: Secondary | ICD-10-CM | POA: Diagnosis present

## 2022-04-22 LAB — GLUCOSE, CAPILLARY
Glucose-Capillary: 187 mg/dL — ABNORMAL HIGH (ref 70–99)
Glucose-Capillary: 156 mg/dL — ABNORMAL HIGH (ref 70–99)
Glucose-Capillary: 151 mg/dL — ABNORMAL HIGH (ref 70–99)
Glucose-Capillary: 154 mg/dL — ABNORMAL HIGH (ref 70–99)

## 2022-04-22 LAB — COMPREHENSIVE METABOLIC PANEL
ALT: 26 U/L (ref 0–44)
AST: 42 U/L — ABNORMAL HIGH (ref 15–41)
Albumin: 2.9 g/dL — ABNORMAL LOW (ref 3.5–5.0)
Alkaline Phosphatase: 219 U/L — ABNORMAL HIGH (ref 38–126)
Anion gap: 14 (ref 5–15)
BUN: 105 mg/dL — ABNORMAL HIGH (ref 8–23)
CO2: 20 mmol/L — ABNORMAL LOW (ref 22–32)
Calcium: 7.8 mg/dL — ABNORMAL LOW (ref 8.9–10.3)
Chloride: 98 mmol/L (ref 98–111)
Creatinine, Ser: 4.53 mg/dL — ABNORMAL HIGH (ref 0.44–1.00)
GFR, Estimated: 9 mL/min — ABNORMAL LOW (ref >60)
Glucose, Bld: 165 mg/dL — ABNORMAL HIGH (ref 70–99)
Potassium: 4.7 mmol/L (ref 3.5–5.1)
Sodium: 132 mmol/L — ABNORMAL LOW (ref 135–145)
Total Bilirubin: 0.7 mg/dL (ref 0.3–1.2)
Total Protein: 5.9 g/dL — ABNORMAL LOW (ref 6.5–8.1)

## 2022-04-22 LAB — MRSA NEXT GEN BY PCR, NASAL: MRSA by PCR Next Gen: NOT DETECTED

## 2022-04-22 LAB — CBC
HCT: 23.7 % — ABNORMAL LOW (ref 36.0–46.0)
Hemoglobin: 7.7 g/dL — ABNORMAL LOW (ref 12.0–15.0)
MCH: 32.8 pg (ref 26.0–34.0)
MCHC: 32.5 g/dL (ref 30.0–36.0)
MCV: 100.9 fL — ABNORMAL HIGH (ref 80.0–100.0)
Platelets: 160 10*3/uL (ref 150–400)
RBC: 2.35 MIL/uL — ABNORMAL LOW (ref 3.87–5.11)
RDW: 17.1 % — ABNORMAL HIGH (ref 11.5–15.5)
WBC: 8.8 10*3/uL (ref 4.0–10.5)
nRBC: 0 % (ref 0.0–0.2)

## 2022-04-22 LAB — TROPONIN I (HIGH SENSITIVITY): Troponin I (High Sensitivity): 26 ng/L — ABNORMAL HIGH (ref <18)

## 2022-04-22 LAB — MAGNESIUM: Magnesium: 2.1 mg/dL (ref 1.7–2.4)

## 2022-04-22 MED ORDER — ALBUTEROL SULFATE (2.5 MG/3ML) 0.083% IN NEBU
2.5000 mg | INHALATION_SOLUTION | RESPIRATORY_TRACT | Status: DC | PRN
  Administered 2022-04-22 – 2022-04-25 (×2): 2.5 mg via RESPIRATORY_TRACT
  Filled 2022-04-22 (×3): qty 3

## 2022-04-22 MED ORDER — FUROSEMIDE 10 MG/ML IJ SOLN
80.0000 mg | Freq: Three times a day (TID) | INTRAMUSCULAR | Status: DC
  Administered 2022-04-22 – 2022-04-23 (×4): 80 mg via INTRAVENOUS
  Filled 2022-04-22 (×4): qty 8

## 2022-04-22 MED ORDER — FUROSEMIDE 10 MG/ML IJ SOLN: 80.0000 mg | Freq: Two times a day (BID) | INTRAMUSCULAR | Status: DC

## 2022-04-22 MED ORDER — CARVEDILOL 12.5 MG PO TABS
12.5000 mg | ORAL_TABLET | Freq: Two times a day (BID) | ORAL | Status: DC
  Administered 2022-04-22 – 2022-05-01 (×12): 12.5 mg via ORAL
  Filled 2022-04-22 (×15): qty 1

## 2022-04-22 MED ORDER — POLYVINYL ALCOHOL 1.4 % OP SOLN
1.0000 [drp] | OPHTHALMIC | Status: DC | PRN
Start: 2022-04-22 — End: 2022-05-01
  Administered 2022-04-22 – 2022-04-25 (×4): 1 [drp] via OPHTHALMIC
  Filled 2022-04-22: qty 15

## 2022-04-22 MED ORDER — SODIUM CHLORIDE 0.9% FLUSH
10.0000 mL | Freq: Two times a day (BID) | INTRAVENOUS | Status: DC
  Administered 2022-04-22 – 2022-05-01 (×12): 10 mL

## 2022-04-22 MED ORDER — SALINE SPRAY 0.65 % NA SOLN
1.0000 | NASAL | Status: DC | PRN
  Administered 2022-04-23 – 2022-04-25 (×2): 1 via NASAL
  Filled 2022-04-22: qty 44

## 2022-04-22 MED ORDER — SODIUM CHLORIDE 0.9% FLUSH: 10.0000 mL | INTRAVENOUS | Status: DC | PRN

## 2022-04-22 NOTE — TOC Initial Note (Signed)
Transition of Care [facility]) - Initial/Assessment Note    Patient Details  Name: [name] MRN: [id number] Date of Birth: 1941-07-24  [facility]) CM/SW Contact:    Leeroy Cha, RN Phone Number: 04/22/2022, 9:46 AM  Clinical Narrative:                 Following for toc needs.  Patient is from [facility] [place].  Will redo fl2 and send out to [facility].  Cyndia Skeeters is aware where patient is.  Expected Discharge Plan: [place] Barriers to Discharge: Continued Medical Work up   Patient Goals and CMS Choice Patient states their goals for this hospitalization and ongoing recovery are:: to go back tp [email or web address] Compare Post Acute Care list provided to:: Patient Choice offered to / list presented to : Adult Children, Patient  Expected Discharge Plan and Services Expected Discharge Plan: [place]   Discharge Planning Services: CM Consult   Living arrangements for the past 2 months: [place]                                      Prior Living Arrangements/Services Living arrangements for the past 2 months: [place] Lives with:: Facility Resident Patient language and need for interpreter reviewed:: Yes Do you feel safe going back to the place where you live?: Yes            Criminal Activity/Legal Involvement Pertinent to Current Situation/Hospitalization: No - Comment as needed  Activities of Daily Living Home Assistive Devices/Equipment: Environmental consultant (specify type), Wheelchair, Eyeglasses, Dentures (specify type) (reading glasses - pt having trouble specifying what type dentures she has) ADL Screening (condition at time of admission) Patient's cognitive ability adequate to safely complete daily activities?: Yes Is the patient deaf or have difficulty hearing?: Yes Does the patient have difficulty seeing, even when wearing glasses/contacts?: Yes (pt states eyes have  been matted a lot recently) Does the patient have difficulty concentrating, remembering, or making decisions?: Yes Patient able to express need for assistance with ADLs?: Yes Does the patient have difficulty dressing or bathing?: Yes Independently performs ADLs?: No Communication: Independent Dressing (OT): Needs assistance Is this a change from baseline?: Pre-admission baseline Grooming: Independent Feeding: Independent Bathing: Needs assistance Is this a change from baseline?: Pre-admission baseline Toileting: Needs assistance Is this a change from baseline?: Pre-admission baseline In/Out Bed: Needs assistance Is this a change from baseline?: Pre-admission baseline Walks in Home: Needs assistance Is this a change from baseline?: Pre-admission baseline Does the patient have difficulty walking or climbing stairs?: Yes Weakness of Legs: Both Weakness of Arms/Hands: Both  Permission Sought/Granted                  Emotional Assessment Appearance:: Appears stated age     Orientation: : Oriented to Self, Oriented to Place, Oriented to  Time, Oriented to Situation Alcohol / Substance Use: Not Applicable Psych Involvement: No (comment)  Admission diagnosis:  Acute kidney injury ([place]) [N17.9] Acute renal failure superimposed on stage 4 chronic kidney disease ([place]) [N17.9, N18.4] Congestive heart failure, unspecified HF chronicity, unspecified heart failure type ([place]) [I50.9] Patient Active Problem List   Diagnosis Date Noted   Acute on chronic diastolic CHF (congestive heart failure) ([place]) 04/21/2022   Hyponatremia 03/20/2022   Malnutrition of moderate degree 03/19/2022   Closed intertrochanteric fracture of left hip (  [place]) 03/18/2022   Cardiac pacemaker in situ 03/18/2022   DNR (do not resuscitate)/DNI(Do Not Intubate) 03/18/2022   Acute renal failure superimposed on stage 4 chronic kidney disease ([place]) 01/03/2022   Mild vascular neurocognitive disorder 09/05/2019   Major  depressive disorder with anxious distress 09/05/2019   Acute postoperative anemia due to expected blood loss 05/21/2019   Anemia of chronic disease 05/21/2019   Anemia in chronic kidney disease 05/21/2019   SSS (sick sinus syndrome) (HCC)    Paroxysmal atrial fibrillation (HCC) - not on systemic anticoagulation due to hx of GI bleeding    S/P placement of cardiac pacemaker    Status post coronary artery stent placement    Abnormal nuclear stress test    Pain of left shoulder joint on movement 06/16/2018   Spinal stenosis of lumbar region 02/28/2018   On anticoagulant therapy 04/22/2017   Tachycardia-bradycardia syndrome ([place]) 12/23/2016   Chronic diastolic CHF (congestive heart failure) ([place]) 08/09/2016   Paroxysmal atrial flutter ([place]) 08/08/2016   Coronary artery disease    Hypertensive heart disease    CKD (chronic kidney disease) stage 4, GFR 15-29 ml/min (HCC)    Hypothyroidism    Osteoarthritis of right knee 07/02/2014   Diabetic peripheral neuropathy associated with type 1 diabetes mellitus ([place]) 09/05/2013   Hypertension associated with diabetes ([place]) 01/06/2012   S/P CABG (coronary artery bypass graft), 12/04/11 12/07/2011   Hyperlipidemia associated with type 2 diabetes mellitus ([place]) 12/03/2011    Class: Diagnosis of   Family history of early CAD 12/01/2011   Insulin dependent type 2 diabetes mellitus ([place]) 12/14/2010   DYSPHAGIA UNSPECIFIED 12/14/2010   ESOPHAGEAL STRICTURE 11/28/2009   GERD 11/28/2009   FLATULENCE-GAS-BLOATING 11/28/2009   CONSTIPATION, CHRONIC 12/19/2007   MENOPAUSE, SURGICAL 12/19/2007   BACK PAIN, LUMBAR 12/19/2007   TOTAL KNEE REPLACEMENT, LEFT, HX OF 12/19/2007   PCP:  Leeroy Cha, MD Pharmacy:   [facility] [address], [place] [address] Phone: [phone] Fax: [phone]     Social Determinants of Health (SDOH) Interventions    Readmission Risk Interventions     02/11/2022    4:50 PM  Readmission Risk Prevention Plan  Transportation Screening Complete  PCP or Specialist Appt within 3-5 Days Complete  HRI or Home Care Consult Complete  Social Work Consult for [place] Planning/Counseling Complete  Palliative Care Screening Not Applicable  Medication Review Press photographer) Complete

## 2022-04-22 NOTE — Progress Notes (Signed)
Progress Note  Patient: [name]:[id number] DOB: 01-05-1941  DOA: 04/21/2022  DOS: 04/22/2022    Brief hospital course: [name] is a 81 y.o. female with medical history significant of CAD status post CABG, GERD, paroxysmal A-fib, sick sinus syndrome status post pacemaker, anemia, diabetes, neuropathy, hyperlipidemia, hypertension, hypothyroidism, CKD 4, diastolic CHF, depression, GI bleed presenting with elevated BUN from SNF. Labs here indicated [name] and she appeared volume overloaded. Started on IV lasix and admitted with nephrology consultation.  Assessment and Plan: [name] on CKD 4 and acute on chronic diastolic heart failure: SCr 4.43 from baseline of around 2.6. BNP >1200 w/LE edema in setting of decreased diuretic dosing at SNF.  - Insufficient diuresis with lasix '40mg'$  IV, augment per nephrology to '80mg'$  IV TID.  - Check U/S, UA micro, lytes. - Pt said not to be candidate for HD, family aware.  - Strict I/O, daily weights.  - Renal diet with fluid restriction   Hyperlipidemia CAD status post CABG and stenting - Continue home rosuvastatin - Continue home carvedilol - Continue home Imdur - Continue home aspirin  GERD - Continue PPI  Paroxysmal A-fib, SSS s/p PPM: Paced rhythm noted.  Anemia of CKD:  - Trend  T2DM with diabetic neuropathy:  - SSI - Hold gabapentin with CrCl < 13m/min.   Hypertension - Holding home amlodipine in the setting of low normal blood pressure, decrease coreg.  Hypothyroid - Continue home synthroid  Depression, dementia - Continue home sertraline, delirium precautions  DNR: POA  Subjective: Pt reports stable swelling, no significant increase in urination since arrival. No new complaints.   Objective: Vitals:   04/22/22 0557 04/22/22 0809 04/22/22 1156 04/22/22 1420  BP: 124/70 (!) 127/47  (!) 123/51  Pulse: 64 60  62  Resp: '17 20  18  '$ Temp: 98.1 F (36.7 C) 98.2 F (36.8 C)  97.8 F (36.6 C)  TempSrc:  Oral  Oral   SpO2: 94% 94% 96% 100%  Weight:      Height:       Gen: Elderly female in no distress Pulm: Nonlabored breathing room air. Clear CV: Regular rate and paced rhythm. No murmur, rub, or gallop. No JVD, pitting dependent edema to hips symmetrically GI: Abdomen soft, non-tender, non-distended, with normoactive bowel sounds.  Ext: Warm, no deformities Skin: No rashes, lesions or ulcers on visualized skin. Neuro: Alert and oriented. No focal neurological deficits. Psych: Judgement and insight appear fair. Mood euthymic & affect congruent. Behavior is appropriate.    Data Personally reviewed: CBC: Recent Labs  Lab 04/21/22 1847 04/22/22 0029  WBC 8.9 8.8  NEUTROABS 7.3  --   HGB 7.5* 7.7*  HCT 23.0* 23.7*  MCV 100.4* 100.9*  PLT 153 1009  Basic Metabolic Panel: Recent Labs  Lab 04/21/22 1641 04/22/22 0029  NA 131* 132*  K 4.7 4.7  CL 99 98  CO2 20* 20*  GLUCOSE 226* 165*  BUN 100* 105*  CREATININE 4.43* 4.53*  CALCIUM 7.8* 7.8*  MG  --  2.1   GFR: Estimated Creatinine Clearance: 7.6 mL/min (A) (by C-G formula based on SCr of 4.53 mg/dL (H)). Liver Function Tests: Recent Labs  Lab 04/21/22 1641 04/22/22 0029  AST 38 42*  ALT 25 26  ALKPHOS 217* 219*  BILITOT 0.7 0.7  PROT 5.9* 5.9*  ALBUMIN 2.9* 2.9*   No results for input(s): "LIPASE", "AMYLASE" in the last 168 hours. No results for input(s): "AMMONIA" in the last 168 hours. Coagulation Profile:  No results for input(s): "INR", "PROTIME" in the last 168 hours. Cardiac Enzymes: No results for input(s): "CKTOTAL", "CKMB", "CKMBINDEX", "TROPONINI" in the last 168 hours. BNP (last 3 results) No results for input(s): "PROBNP" in the last 8760 hours. HbA1C: No results for input(s): "HGBA1C" in the last 72 hours. CBG: Recent Labs  Lab 04/22/22 1040 04/22/22 1156 04/22/22 1640  GLUCAP 187* 156* 151*   Lipid Profile: No results for input(s): "CHOL", "HDL", "LDLCALC", "TRIG", "CHOLHDL", "LDLDIRECT" in the last  72 hours. Thyroid Function Tests: No results for input(s): "TSH", "T4TOTAL", "FREET4", "T3FREE", "THYROIDAB" in the last 72 hours. Anemia Panel: No results for input(s): "VITAMINB12", "FOLATE", "FERRITIN", "TIBC", "IRON", "RETICCTPCT" in the last 72 hours. Urine analysis:    Component Value Date/Time   COLORURINE STRAW (A) 03/18/2022 0222   APPEARANCEUR CLEAR 03/18/2022 0222   LABSPEC 1.009 03/18/2022 0222   PHURINE 5.0 03/18/2022 0222   GLUCOSEU NEGATIVE 03/18/2022 0222   HGBUR NEGATIVE 03/18/2022 0222   BILIRUBINUR NEGATIVE 03/18/2022 0222   KETONESUR NEGATIVE 03/18/2022 0222   PROTEINUR 100 (A) 03/18/2022 0222   UROBILINOGEN 0.2 01/04/2012 1105   NITRITE NEGATIVE 03/18/2022 0222   LEUKOCYTESUR NEGATIVE 03/18/2022 0222   Recent Results (from the past 240 hour(s))  MRSA Next Gen by PCR, Nasal     Status: None   Collection Time: 04/22/22  2:02 PM   Specimen: Nasal Mucosa; Nasal Swab  Result Value Ref Range Status   MRSA by PCR Next Gen NOT DETECTED NOT DETECTED Final    Comment: (NOTE) The GeneXpert MRSA Assay (FDA approved for NASAL specimens only), is one component of a comprehensive MRSA colonization surveillance program. It is not intended to diagnose MRSA infection nor to guide or monitor treatment for MRSA infections. Test performance is not FDA approved in patients less than 28 years old. Performed at [facility], [place] [address], [place], [place] [address]      US RENAL  Result Date: 04/22/2022 CLINICAL DATA:  Acute renal failure, stage IV EXAM: RENAL / URINARY TRACT ULTRASOUND COMPLETE COMPARISON:  03/21/2022 and previous FINDINGS: Right Kidney: Renal measurements: 9.6 x 5.1 x 5.3 cm = volume: 134 mL. Parenchyma thin, echogenic compared to the adjacent liver. No hydronephrosis. 0.6 cm exophytic nodule or cyst from the upper pole. Left Kidney: Renal measurements: 8.5 x 4.5 x 3.8 cm = volume: 76 mL. Cortical thinning. No hydronephrosis. 1.8 cm probable  cyst exophytic from the midpole. Bladder: Appears normal for degree of bladder distention. Other: None. IMPRESSION: 1. No hydronephrosis. 2. Bilateral renal parenchymal atrophy with echogenic parenchyma, a nonspecific indicator of medical renal disease. Electronically Signed   By: Lucrezia Europe M.D.   On: 04/22/2022 16:03   DG Chest 1 View  Result Date: 04/21/2022 CLINICAL DATA:  Provided history: Abnormal labs. EXAM: CHEST  1 VIEW COMPARISON:  Prior chest radiographs 02/10/2022 and earlier. FINDINGS: Left chest dual lead implantable cardiac device. Prior median sternotomy/CABG. Mild cardiomegaly. Aortic atherosclerosis. Ill-defined opacity within the medial right lung base. No appreciable pulmonary edema. No evidence of pleural effusion or pneumothorax. No acute bony abnormality identified. Chronic deformity of the proximal left humerus. IMPRESSION: Opacity within the medial right lung base suspicious for pneumonia. Followup PA and lateral chest radiographs are recommended in 3-4 weeks following trial of antibiotic therapy to ensure resolution and exclude underlying malignancy. Cardiomegaly. Aortic Atherosclerosis (ICD10-I70.0). Electronically Signed   By: Kellie Simmering D.O.   On: 04/21/2022 18:51    Family Communication: None at bedside  Disposition:  Status is: Inpatient Remains inpatient appropriate because: Continue IV diuresis Planned Discharge Destination: [place], MD 04/22/2022 6:00 PM Page by [email or web address]

## 2022-04-22 NOTE — NC FL2 (Signed)
[place] LEVEL OF CARE SCREENING TOOL     IDENTIFICATION  Patient Name: [name] Birthdate: 09/15/1941 Sex: female Admission Date (Current Location): 04/21/2022  [facility] and [place] Number:  [place] and Address:  [facility],  [place], [place]      Provider Number: [id number]  Attending Physician Name and Address:  Patrecia Pour, MD  Relative Name and Phone Number:       Current Level of Care: Hospital Recommended Level of Care: [place] Prior Approval Number:    Date Approved/Denied:   PASRR Number: [id number]  Discharge Plan: SNF    Current Diagnoses: Patient Active Problem List   Diagnosis Date Noted   Acute on chronic diastolic CHF (congestive heart failure) ([place]) 04/21/2022   Hyponatremia 03/20/2022   Malnutrition of moderate degree 03/19/2022   Closed intertrochanteric fracture of left hip ([place]) 03/18/2022   Cardiac pacemaker in situ 03/18/2022   DNR (do not resuscitate)/DNI(Do Not Intubate) 03/18/2022   Acute renal failure superimposed on stage 4 chronic kidney disease ([place]) 01/03/2022   Mild vascular neurocognitive disorder 09/05/2019   Major depressive disorder with anxious distress 09/05/2019   Acute postoperative anemia due to expected blood loss 05/21/2019   Anemia of chronic disease 05/21/2019   Anemia in chronic kidney disease 05/21/2019   SSS (sick sinus syndrome) (HCC)    Paroxysmal atrial fibrillation ([place]) - not on systemic anticoagulation due to hx of GI bleeding    S/P placement of cardiac pacemaker    Status post coronary artery stent placement    Abnormal nuclear stress test    Pain of left shoulder joint on movement 06/16/2018   Spinal stenosis of lumbar region 02/28/2018   On anticoagulant therapy 04/22/2017   Tachycardia-bradycardia syndrome ([place]) 12/23/2016   Chronic diastolic CHF (congestive heart failure) ([place]) 08/09/2016   Paroxysmal atrial flutter ([address] [place])  08/08/2016   Coronary artery disease    Hypertensive heart disease    CKD (chronic kidney disease) stage 4, GFR 15-29 ml/min (HCC)    Hypothyroidism    Osteoarthritis of right knee 07/02/2014   Diabetic peripheral neuropathy associated with type 1 diabetes mellitus ([place]) 09/05/2013   Hypertension associated with diabetes ([place]) 01/06/2012   S/P CABG (coronary artery bypass graft), 12/04/11 12/07/2011   Hyperlipidemia associated with type 2 diabetes mellitus ([place]) 12/03/2011   Family history of early CAD 12/01/2011   Insulin dependent type 2 diabetes mellitus ([place]) 12/14/2010   DYSPHAGIA UNSPECIFIED 12/14/2010   ESOPHAGEAL STRICTURE 11/28/2009   GERD 11/28/2009   FLATULENCE-GAS-BLOATING 11/28/2009   CONSTIPATION, CHRONIC 12/19/2007   MENOPAUSE, SURGICAL 12/19/2007   BACK PAIN, LUMBAR 12/19/2007   TOTAL KNEE REPLACEMENT, LEFT, HX OF 12/19/2007    Orientation RESPIRATION BLADDER Height & Weight     Self, Time, Situation, Place  Normal Continent Weight: 58 kg Height:  5' (152.4 cm)  BEHAVIORAL SYMPTOMS/MOOD NEUROLOGICAL BOWEL NUTRITION STATUS      Continent Diet (regular)  AMBULATORY STATUS COMMUNICATION OF NEEDS Skin   Extensive Assist Verbally Normal                       Personal Care Assistance Level of Assistance  Bathing, Feeding, Dressing Bathing Assistance: Limited assistance Feeding assistance: Limited assistance Dressing Assistance: Limited assistance     Functional Limitations Info  Sight, Hearing, Speech Sight Info: Adequate Hearing Info: Adequate Speech Info: Adequate    SPECIAL CARE FACTORS FREQUENCY  PT (By licensed PT),  OT (By licensed OT)     PT Frequency: 5 x weekly OT Frequency: 5 x weekly            Contractures Contractures Info: Not present    Additional Factors Info  Code Status Code Status Info: DNR             Current Medications (04/22/2022):  This is the current hospital active medication list Current Facility-Administered  Medications  Medication Dose Route Frequency Provider Last Rate Last Admin   acetaminophen (TYLENOL) tablet 650 mg  650 mg Oral Q6H PRN Marcelyn Bruins, MD   650 mg at 04/22/22 0408   Or   acetaminophen (TYLENOL) suppository 650 mg  650 mg Rectal Q6H PRN Marcelyn Bruins, MD       aspirin EC tablet 81 mg  81 mg Oral Daily Marcelyn Bruins, MD   81 mg at 04/22/22 0911   carvedilol (COREG) tablet 25 mg  25 mg Oral BID Marcelyn Bruins, MD   25 mg at 04/22/22 0911   enoxaparin (LOVENOX) injection 30 mg  30 mg Subcutaneous Q24H Marcelyn Bruins, MD       furosemide (LASIX) injection 40 mg  40 mg Intravenous BID Marcelyn Bruins, MD   40 mg at 04/22/22 0911   insulin aspart (novoLOG) injection 0-9 Units  0-9 Units Subcutaneous TID WC Marcelyn Bruins, MD   1 Units at 04/22/22 0913   isosorbide mononitrate (IMDUR) 24 hr tablet 30 mg  30 mg Oral Daily Marcelyn Bruins, MD   30 mg at 04/22/22 2671   levothyroxine (SYNTHROID) tablet 100 mcg  100 mcg Oral QHS Marcelyn Bruins, MD   100 mcg at 04/22/22 0011   pantoprazole (PROTONIX) EC tablet 40 mg  40 mg Oral QAC breakfast Marcelyn Bruins, MD   40 mg at 04/22/22 0911   polyethylene glycol (MIRALAX / GLYCOLAX) packet 17 g  17 g Oral Daily PRN Marcelyn Bruins, MD       polyvinyl alcohol (LIQUIFILM TEARS) 1.4 % ophthalmic solution 1 drop  1 drop Both Eyes PRN Marcelyn Bruins, MD       rosuvastatin (CRESTOR) tablet 10 mg  10 mg Oral QHS Marcelyn Bruins, MD       sertraline (ZOLOFT) tablet 25 mg  25 mg Oral Daily Marcelyn Bruins, MD   25 mg at 04/22/22 0911   sodium chloride (OCEAN) 0.65 % nasal spray 1 spray  1 spray Each Nare PRN Marcelyn Bruins, MD       sodium chloride flush (NS) 0.9 % injection 10-40 mL  10-40 mL Intracatheter Q12H Marcelyn Bruins, MD   10 mL at 04/22/22 0411   sodium chloride flush (NS) 0.9 % injection 10-40 mL  10-40 mL Intracatheter PRN Marcelyn Bruins, MD       sodium chloride  flush (NS) 0.9 % injection 3 mL  3 mL Intravenous Q12H Marcelyn Bruins, MD   3 mL at 04/22/22 0011     Discharge Medications: Please see discharge summary for a list of discharge medications.  Relevant Imaging Results:  Relevant Lab Results:   Additional [employer] COVID-19 Vaccine 01/07/2020 , 12/17/2019 SS# [id number]  Leeroy Cha, RN

## 2022-04-22 NOTE — Consult Note (Addendum)
Renal Service Consult Note [facility]  [name] 04/22/2022 Sol Blazing, MD Requesting Physician: Dr. Bonner Puna  Reason for Consult:  HPI: The patient is a 81 y.o. year-old w/ hx of CAD sp CABG, PAF, SSS sp PPM, anemia, DM2, HL, HTN, hypothyroid, CKD 4, diast CHF, depression, GIB who presented for abnormal lab values from her nursing home. Pt sp recent hip fracture repair here last month and was dc'd to a rehab center. SNF labs showed ^'d BUN. After her recent admission her usual diuretic was held and she has had increasing swelling of the legs. In ED BP was 408X systolic, Na 448 , CO2 20 BUN 100, creat 4.43 (b/l 2.4- 2.8), Ca 7.8, alb 2.9, Hb 7.5, BNP 1241, trop 25. CXR showed R base opacity and clear otherwise. Pt admitted and rec'd a dose of Lasix in the ED. We are asked to see for renal failure.   Pt seen in room. Her cognition is poor but she is pleasant. Main c/o's are some SOB and some leg swelling. No fevers or chills. No CP.   ROS - denies CP, no joint pain, no HA, no blurry vision, no rash, no diarrhea, no nausea/ vomiting, no dysuria, no difficulty voiding   Past Medical History  Past Medical History:  Diagnosis Date   Acute medial meniscus tear of right knee 07/02/2014   Acute renal failure,admitted 01/03/12, after admission for diastolic chf 18/56/3149   Anemia    Anxiety    Arthritis    "in my hands; knees, back" (09/27/2018)   CAD (coronary artery disease)    a. 40-59% bilaterally 10/2015.   CAD S/P percutaneous coronary angioplasty 09/27/2018   S/P SVG-OM PCI with DES 09/27/18   Chronic diastolic CHF (congestive heart failure) ([place])    a. 05/2016 Echo: EF 60-65%, no rwma, Gr1 DD, Ao sclerosis w/o stenosis, triv MR;  b. 07/2016 TEE: EF 55-60%, no rwma, mild MR.   Chronic headaches    CKD (chronic kidney disease), stage III (HCC)    Coronary artery disease    a. 02/2007 Persantine MV: low risk;  b. 11/2011 CABG x 3 (LIMA->LAD, VG->OM, VG->RCA);  c.  05/2016 MV: EF >65%, no isch/infarct, horiz ST dep in I, II, V5-V6.   Depression    Diverticulosis    Esophageal stricture    GERD (gastroesophageal reflux disease)    Hemorrhoids    Hiatal hernia    Hyperkalemia    a. ARB stopped due to this.   Hyperlipidemia    Hypertension    Hypertensive heart disease    Hypothyroidism    Major depressive disorder with anxious distress 09/05/2019   Mild cognitive impairment    a. seen by neurology.   Mild vascular neurocognitive disorder 09/05/2019   Myocardial infarction ([place]) 12/07/2011   NSTEMI (non-ST elevated myocardial infarction) ([place]) 12/01/2011   PAF (paroxysmal atrial fibrillation) ([place])    a. post-op CABG.   Pain    RIGHT KNEE PAIN - TORN RIGHT MEDIAL MENISCUS   Paroxysmal atrial flutter ([place])    a. 07/2016 s/p TEE & DCCV;  b. 07/2016 Recurrent PAFlutter req initiation of amio & PPM in setting of tachy-brady;  c. CHA2DS2VASc = 7-->Xarelto 15 mg QD.   Pneumonia    "twice" (09/27/2018)   PONV (postoperative nausea and vomiting)    Presence of permanent cardiac pacemaker 08/12/2016   S/P CABG (coronary artery bypass graft), 12/04/11 12/07/2011   LIMA to LAD, SVG to OM, SVG to RCA  Sinus bradycardia    a. not on BB due to this.   Skin cancer    "face" (09/27/2018)   Tachy-brady syndrome ([place])    a. 07/2016 Jxnl brady following DCCV, recurrent Aflutter-->amio + SJM 2272 Assurity MRI DC PPM (ser # [id number]).   Type II diabetes mellitus ([place])    Past Surgical History  Past Surgical History:  Procedure Laterality Date   ABDOMINAL HYSTERECTOMY  1980's   ANKLE FRACTURE SURGERY Right    "put pins both side right ankle"   BACK SURGERY  2006   "cyst growing near my spine"   CARDIAC CATHETERIZATION  12/02/2011   mild LV dysfunction with mod hypocontractility of mid-distal anterolateral wall; CAD w/ostial tapering of L [place] with 50% diffuse ostial narrowing of LAD, 99% eccentric focal prox LAD stenosis followed by 70% prox LAD stenosis after  1st diag, 20% mid LAD narrowing; 80% ostial-to-prox L Cfx stenosis & 40-50% irregularity of RCA (Dr. Corky Downs)   CARDIOVERSION N/A 08/11/2016   Procedure: CARDIOVERSION;  Surgeon: Lelon Perla, MD;  Location: [facility];  Service: Cardiovascular;  Laterality: N/A;   CATARACT EXTRACTION W/ INTRAOCULAR LENS  IMPLANT, BILATERAL Bilateral ~ 2010   [place] GRAFT  12/04/2011   Procedure: CORONARY ARTERY BYPASS GRAFTING (CABG);  Surgeon: Tharon Aquas Adelene Idler, MD;  Location: [place];  Service: Open Heart Surgery;  Laterality: N/A;  CABG x three,  using left internal mammary artery, and right leg greater saphenous vein harvested endoscopically   CORONARY STENT INTERVENTION N/A 09/27/2018   Procedure: CORONARY STENT INTERVENTION;  Surgeon: Troy Sine, MD;  Location: [place] [facility];  Service: Cardiovascular;  Laterality: N/A;   DILATION AND CURETTAGE OF UTERUS     "a couple times"   EP IMPLANTABLE DEVICE N/A 08/12/2016   Procedure: Pacemaker Implant;  Surgeon: Will Meredith Leeds, MD;  Location: [place] CV [facility];  Service: Cardiovascular;  Laterality: N/A;   ESOPHAGOGASTRODUODENOSCOPY (EGD) WITH ESOPHAGEAL DILATION     FRACTURE SURGERY     INTRAMEDULLARY (IM) NAIL INTERTROCHANTERIC Left 03/18/2022   Procedure: INTRAMEDULLARY (IM) NAIL INTERTROCHANTRIC;  Surgeon: Georgeanna Harrison, MD;  Location: [place];  Service: Orthopedics;  Laterality: Left;   JOINT REPLACEMENT     KNEE ARTHROSCOPY WITH MEDIAL MENISECTOMY Right 07/02/2014   Procedure: RIGHT KNEE ARTHROSCOPY WITH PARTIAL MEDIAL MENISTECTOMY, ABRASION CONDROPLASTYU OF PATELLA,ABRASION CONDROPLASTY OF MEDIAL FEMEROL CONDYL, MICROFRACTURE OF MEDIAL FEMEROL CONDYL;  Surgeon: Tobi Bastos, MD;  Location: [facility];  Service: Orthopedics;  Laterality: Right;   LEFT HEART CATH AND CORS/GRAFTS ANGIOGRAPHY N/A 09/06/2018   Procedure: LEFT HEART CATH AND CORS/GRAFTS ANGIOGRAPHY;  Surgeon: Troy Sine, MD;  Location:  [place] [facility];  Service: Cardiovascular;  Laterality: N/A;   LEFT HEART CATHETERIZATION WITH CORONARY ANGIOGRAM N/A 12/02/2011   Procedure: LEFT HEART CATHETERIZATION WITH CORONARY ANGIOGRAM;  Surgeon: Troy Sine, MD;  Location: [facility] CATH LAB;  Service: Cardiovascular;  Laterality: N/A;  Coronary angiogram, possible PCI   TEE WITHOUT CARDIOVERSION N/A 08/11/2016   Procedure: TRANSESOPHAGEAL ECHOCARDIOGRAM (TEE);  Surgeon: Lelon Perla, MD;  Location: [facility] ENDOSCOPY;  Service: Cardiovascular;  Laterality: N/A;   TEE WITHOUT CARDIOVERSION N/A 05/18/2019   Procedure: TRANSESOPHAGEAL ECHOCARDIOGRAM (TEE);  Surgeon: [name], MD;  Location: [facility] ENDOSCOPY;  Service: Cardiovascular;  Laterality: N/A;   [place] Left ~ 2006   TRANSTHORACIC ECHOCARDIOGRAM  02/19/2013   EF 55-60%, grade 1  diastolic dysfunction; mildly thickend/calcified AV leaflets; mildly calcidied MV annulus; mild TR   Family History  Family History  Problem Relation Age of Onset   Diabetes Mother    CVA Mother    Hypertension Mother    Heart disease Father    Hyperlipidemia Father    Breast cancer Sister    Breast cancer Sister        x 3   Heart disease Brother        x5; one with MI   Heart disease Sister        x3   Diabetes Sister        x3   Lung cancer Sister    Breast cancer Sister    Colon cancer Neg Hx    Social History  reports that she has never smoked. She has never used smokeless tobacco. She reports that she does not currently use alcohol. She reports that she does not use drugs. Allergies  Allergies  Allergen Reactions   Clonidine Derivatives Other (See Comments)    Bradycardia and fatigue    Crestor [Rosuvastatin] Other (See Comments)    Made the patient feel tired/weak   Losartan Potassium Other (See Comments)    Hyperkalemia   Sulfa Antibiotics Other (See Comments)    Childhood reaction not recalled   Epinephrine Other (See Comments)     Abnormal feeling. Dental exam/injection of local w/ epi.   Hydralazine Other (See Comments)    Nausea/gi upset    Hydralazine Hcl Anxiety and Other (See Comments)    Nervousness, anxiousness, GI upset   Home medications Prior to Admission medications   Medication Sig Start Date End Date Taking? Authorizing Provider  acetaminophen (TYLENOL) 325 MG tablet Take 975 mg by mouth every 8 (eight) hours as needed for moderate pain.   Yes [provider]  Allopurinol 200 MG TABS Take 200 mg by mouth daily.   Yes [provider]  alum & mag hydroxide-simeth (MYLANTA MAXIMUM STRENGTH) 400-400-40 MG/5ML suspension Take 30 mLs by mouth every 4 (four) hours as needed for indigestion.   Yes [provider]  amLODipine (NORVASC) 5 MG tablet Take 1 tablet (5 mg total) by mouth daily. 09/14/21 04/21/22 Yes Hilty, Nadean Corwin, MD  aspirin EC 81 MG tablet Take 1 tablet (81 mg total) by mouth daily. 01/12/22  Yes [place] Eric J, DO  azithromycin (ZITHROMAX) 500 MG tablet Take 500 mg by mouth daily. 04/19/22  Yes [provider]  bisacodyl (DULCOLAX) 10 MG suppository Place 10 mg rectally daily as needed for moderate constipation.   Yes [provider]  carvedilol (COREG) 25 MG tablet Take 1 tablet (25 mg total) by mouth 2 (two) times daily. 02/12/22 04/21/22 Yes Dana Allan I, MD  diclofenac Sodium (VOLTAREN) 1 % GEL Apply 2 g topically 2 (two) times daily as needed (apply to hands).   Yes [provider]  enoxaparin (LOVENOX) 30 MG/0.3ML injection Inject 0.3 mLs (30 mg total) into the skin daily. 03/19/22 04/30/22 Yes Georgeanna Harrison, MD  ferrous sulfate 325 (65 FE) MG tablet Take 1 tablet (325 mg total) by mouth 2 (two) times daily with a meal. Patient taking differently: Take 325 mg by mouth daily. 03/23/22  Yes [place] Eric J, DO  gabapentin (NEURONTIN) 300 MG capsule Take 1 capsule (300 mg total) by mouth at bedtime. 05/22/19  Yes Roney Jaffe, MD  Glucagon,  rDNA, (GLUCAGON EMERGENCY) 1 MG KIT Inject 1 mg as directed once as needed (hypoglycemia).  Yes [provider]  guaiFENesin (MUCINEX) 600 MG 12 hr tablet Take 600 mg by mouth 2 (two) times daily.   Yes [provider]  insulin glargine (LANTUS) 100 UNIT/ML injection Inject 0.15 mLs (15 Units total) into the skin daily. Patient taking differently: Inject 10 Units into the skin daily. Hold for BS less than 150 03/23/22 06/21/22 Yes [place] Eric J, DO  ipratropium-albuterol (DUONEB) 0.5-2.5 (3) MG/3ML SOLN Take 3 mLs by nebulization every 6 (six) hours as needed (wheezing).   Yes [provider]  isosorbide mononitrate (IMDUR) 30 MG 24 hr tablet Take 1 tablet (30 mg total) by mouth daily. 03/01/22  Yes Hilty, Nadean Corwin, MD  Lactulose 20 GM/30ML SOLN Take 30 mLs by mouth daily as needed (constipation).   Yes [provider]  meclizine (ANTIVERT) 25 MG tablet Take 25 mg by mouth daily as needed for dizziness.   Yes [provider]  melatonin 3 MG TABS tablet Take 6 mg by mouth at bedtime.   Yes [provider]  nitroGLYCERIN (NITROSTAT) 0.4 MG SL tablet Place 1 tablet (0.4 mg total) under the tongue every 5 (five) minutes as needed for chest pain (max 3 doses). 02/11/20 04/21/22 Yes Hilty, Nadean Corwin, MD  NON FORMULARY Take 1 Dose by mouth daily as needed (suppliment).   Yes [provider]  ondansetron (ZOFRAN) 4 MG tablet Take 4 mg by mouth every 4 (four) hours as needed for nausea or vomiting.   Yes [provider]  OxyCODONE HCl, Abuse Deter, (OXAYDO) 5 MG TABA Take 5 mg by mouth every 4 (four) hours as needed (pain).   Yes [provider]  pantoprazole (PROTONIX) 40 MG tablet Take 40 mg by mouth at bedtime. 06/24/20  Yes [provider]  polyethylene glycol (MIRALAX / GLYCOLAX) 17 g packet Take 17 g by mouth daily. Patient taking differently: Take 17 g by mouth daily as needed for mild constipation. 02/13/22  Yes  Dana Allan I, MD  rosuvastatin (CRESTOR) 10 MG tablet Take 10 mg by mouth at bedtime. 01/28/21  Yes [provider]  senna (SENOKOT) 8.6 MG TABS tablet Take 2 tablets by mouth at bedtime.   Yes [provider]  sodium chloride 0.45 % solution Inject 65 mL/hr into the vein continuous. 2 L total   Yes [provider]  SYNTHROID 100 MCG tablet Take 100 mcg by mouth at bedtime. 03/26/21  Yes [provider]  HYDROcodone-acetaminophen (NORCO/VICODIN) 5-325 MG tablet Take 1-2 tablets by mouth every 4 (four) hours as needed for moderate pain (pain score 4-6). Patient not taking: Reported on 04/21/2022 03/19/22   Georgeanna Harrison, MD     Vitals:   04/22/22 9295 04/22/22 0809 04/22/22 1156 04/22/22 1420  BP: 124/70 (!) 127/47  (!) 123/51  Pulse: 64 60  62  Resp: _0 Temp: 98.1 F (36.7 C) 98.2 F (36.8 C)  97.8 F (36.6 C)  TempSrc:  Oral  Oral  SpO2: 94% 94% 96% 100%  Weight:      Height:       Exam Gen elderly WF, alert, chronically ill appearing and frail No rash, cyanosis or gangrene Sclera anicteric, throat clear  No jvd or bruits Chest clear bilat to bases, no rales/ wheezing RRR no MRG Abd soft ntnd no mass or ascites +bs GU defer MS no joint effusions or deformity Ext 2+ diffuse pitting edema of both LE's from feet to hips, no wounds or ulcers Neuro is  oriented to name only      Home meds include - amlodipine 5, aspirin, carvedilol 25 bid, enoxaparin, gabapentin, insulin glargine, isosorbide mononitrate, pantoprazole, rosuvastatin, synthroid, hydrocodone-aceta prn    BUN 105  creat 4.53  Ca 7.8 alb 2.9  Na 132  K 4.7     WBC 8  Hb 7.7 plt 160    CXR 6/28 - IMPRESSION: Opacity within the medial right lung base suspicious for pneumonia. Cardiomegaly.     UA pend     UNa, UCr pending     BP's here - 100s- 120s/ 40- 70, HR 62  RR 18 afeb      B/l creat last 3 months in 2023 is 2.4- 2.8, eGFR 16- 19 ml/min  Assessment/ Plan: [name] on  CKD 4 -  b/l creat from last 3 months in 2023 is 2.4- 2.8, eGFR 16- 19 ml/min. Creat here is 4.4, eGFR 9 ml/min, in setting of SOB, vol overload/ LE edema, low-normal BP's and gen'd weakness. Pt has dementia/ cognitive issues and per my discussion w/ her renal MD Dr Moshe Cipro today she is not a candidate for HD, family is aware. No acei/ ARB. Will get UA, urine lytes and renal US, place foley. She is vol overloaded and will ^ IV lasix to 80 tid. Will follow.  Dementia DNR HTN /vol - vol as above. Will lower coreg to 12.5 bid for now, holding norvasc.  CADsp CABG PAF  SP PPM      Rob Meagon Duskin  MD 04/22/2022, 2:35 PM Recent Labs  Lab 04/21/22 1641 04/21/22 1847 04/22/22 0029  HGB  --  7.5* 7.7*  ALBUMIN 2.9*  --  2.9*  CALCIUM 7.8*  --  7.8*  CREATININE 4.43*  --  4.53*  K 4.7  --  4.7   Inpatient medications:  aspirin EC  81 mg Oral Daily   carvedilol  25 mg Oral BID   enoxaparin (LOVENOX) injection  30 mg Subcutaneous Q24H   furosemide  40 mg Intravenous BID   furosemide  80 mg Intravenous BID   insulin aspart  0-9 Units Subcutaneous TID WC   isosorbide mononitrate  30 mg Oral Daily   levothyroxine  100 mcg Oral QHS   pantoprazole  40 mg Oral QAC breakfast   rosuvastatin  10 mg Oral QHS   sertraline  25 mg Oral Daily   sodium chloride flush  10-40 mL Intracatheter Q12H   sodium chloride flush  3 mL Intravenous Q12H    acetaminophen **OR** acetaminophen, albuterol, polyethylene glycol, polyvinyl alcohol, sodium chloride, sodium chloride flush

## 2022-04-23 DIAGNOSIS — D638 Anemia in other chronic diseases classified elsewhere: Secondary | ICD-10-CM | POA: Diagnosis not present

## 2022-04-23 DIAGNOSIS — K2100 Gastro-esophageal reflux disease with esophagitis, without bleeding: Secondary | ICD-10-CM | POA: Diagnosis not present

## 2022-04-23 DIAGNOSIS — I5033 Acute on chronic diastolic (congestive) heart failure: Secondary | ICD-10-CM | POA: Diagnosis not present

## 2022-04-23 DIAGNOSIS — E1169 Type 2 diabetes mellitus with other specified complication: Secondary | ICD-10-CM | POA: Diagnosis not present

## 2022-04-23 LAB — GLUCOSE, CAPILLARY
Glucose-Capillary: 128 mg/dL — ABNORMAL HIGH (ref 70–99)
Glucose-Capillary: 170 mg/dL — ABNORMAL HIGH (ref 70–99)
Glucose-Capillary: 130 mg/dL — ABNORMAL HIGH (ref 70–99)
Glucose-Capillary: 158 mg/dL — ABNORMAL HIGH (ref 70–99)

## 2022-04-23 LAB — BASIC METABOLIC PANEL
Anion gap: 13 (ref 5–15)
BUN: 109 mg/dL — ABNORMAL HIGH (ref 8–23)
CO2: 20 mmol/L — ABNORMAL LOW (ref 22–32)
Calcium: 8 mg/dL — ABNORMAL LOW (ref 8.9–10.3)
Chloride: 97 mmol/L — ABNORMAL LOW (ref 98–111)
Creatinine, Ser: 4.6 mg/dL — ABNORMAL HIGH (ref 0.44–1.00)
GFR, Estimated: 9 mL/min — ABNORMAL LOW (ref >60)
Glucose, Bld: 131 mg/dL — ABNORMAL HIGH (ref 70–99)
Potassium: 4.5 mmol/L (ref 3.5–5.1)
Sodium: 130 mmol/L — ABNORMAL LOW (ref 135–145)

## 2022-04-23 LAB — CBC
HCT: 23.3 % — ABNORMAL LOW (ref 36.0–46.0)
Hemoglobin: 7.5 g/dL — ABNORMAL LOW (ref 12.0–15.0)
MCH: 32.5 pg (ref 26.0–34.0)
MCHC: 32.2 g/dL (ref 30.0–36.0)
MCV: 100.9 fL — ABNORMAL HIGH (ref 80.0–100.0)
Platelets: 187 10*3/uL (ref 150–400)
RBC: 2.31 MIL/uL — ABNORMAL LOW (ref 3.87–5.11)
RDW: 17 % — ABNORMAL HIGH (ref 11.5–15.5)
WBC: 8.2 10*3/uL (ref 4.0–10.5)
nRBC: 0 % (ref 0.0–0.2)

## 2022-04-23 LAB — URINALYSIS, ROUTINE W REFLEX MICROSCOPIC
Bilirubin Urine: NEGATIVE
Glucose, UA: NEGATIVE mg/dL
Ketones, ur: NEGATIVE mg/dL
Nitrite: NEGATIVE
Protein, ur: 100 mg/dL — AB
Specific Gravity, Urine: 1.01 (ref 1.005–1.030)
WBC, UA: >50 WBC/hpf — ABNORMAL HIGH (ref 0–5)
pH: 5 (ref 5.0–8.0)

## 2022-04-23 MED ORDER — BOOST / RESOURCE BREEZE PO LIQD CUSTOM
1.0000 | Freq: Three times a day (TID) | ORAL | Status: DC
  Administered 2022-04-24 – 2022-05-01 (×15): 1 via ORAL

## 2022-04-23 MED ORDER — FUROSEMIDE 10 MG/ML IJ SOLN
100.0000 mg | Freq: Three times a day (TID) | INTRAVENOUS | Status: DC
  Administered 2022-04-23 – 2022-04-24 (×2): 100 mg via INTRAVENOUS
  Filled 2022-04-23 (×3): qty 10

## 2022-04-23 MED ORDER — ADULT MULTIVITAMIN W/MINERALS CH
1.0000 | ORAL_TABLET | Freq: Every day | ORAL | Status: DC
  Administered 2022-04-23 – 2022-04-28 (×6): 1 via ORAL
  Filled 2022-04-23 (×6): qty 1

## 2022-04-23 MED ORDER — CHLORHEXIDINE GLUCONATE CLOTH 2 % EX PADS
6.0000 | MEDICATED_PAD | Freq: Every day | CUTANEOUS | Status: DC
  Administered 2022-04-23 – 2022-05-01 (×9): 6 via TOPICAL

## 2022-04-23 NOTE — Progress Notes (Signed)
Patient care assumed by this RN at 1500 this shift. I have reviewed the previous RN's assessment and agree with their findings. PT is resting comfortably at this time and is in no acute distress. Will continue to monitor.

## 2022-04-23 NOTE — Progress Notes (Signed)
[facility] Progress Note  Subjective: creat up 4.8, poor UOP despite IV lasix 80 tid  Vitals:   04/22/22 2110 04/23/22 0500 04/23/22 0523 04/23/22 1200  BP: (!) 117/49  (!) 132/40 (!) 139/47  Pulse: 62  (!) 59 63  Resp: '16  16 18  ' Temp: 98.2 F (36.8 C)  98.1 F (36.7 C) 97.7 F (36.5 C)  TempSrc: Oral  Oral Oral  SpO2: 97%  96% 96%  Weight:  60.6 kg    Height:        Exam: Gen pleasant, elderly, responsive Sclera anicteric, throat clear  No jvd or bruits Chest clear bilat to bases RRR no MRG Abd soft ntnd no mass or ascites +bs Ext 2+ diffuse pitting edema bilat LE's Neuro is oriented to name only       Home meds include - amlodipine 5, aspirin, carvedilol 25 bid, enoxaparin, gabapentin, insulin glargine, isosorbide mononitrate, pantoprazole, rosuvastatin, synthroid, hydrocodone-aceta prn     BUN 105  creat 4.53  Ca 7.8 alb 2.9  Na 132  K 4.7     WBC 8  Hb 7.7 plt 160    CXR 6/28 - IMPRESSION: Opacity within the medial right lung base suspicious for pneumonia. Cardiomegaly.     UA mod Hb/ LE, 100 prot, many bact, 6-10 rbc, >50 wbc     BP's here - 100s- 120s/ 40- 70, HR 62  RR 18 afeb      B/l creat last 3 months in 2023 is 2.4- 2.8, eGFR 16- 19 ml/min   Assessment/ Plan: [name] on CKD 4 -  b/l creat from last 3 months in 2023 is 2.4- 2.8, eGFR 16- 19 ml/min. Creat here is 4.4, eGFR 9 ml/min, in setting of SOB, vol overload/ LE edema, low-normal BP's and gen'd weakness. Pt has dementia/ cognitive issues and per my discussion w/ her renal MD Dr Moshe Cipro today she is not a candidate for HD, family is aware. No acei/ ARB. Will get UA, urine lytes and renal US, place foley. She is vol overloaded but not responding well to IV lasix 80 tid. May be R- sided edema that is not accessible w/o lowering intravasc volume and worsening renal function. Will try 1 more day of IV lasix 100 q 8 to see if higher dosing helps. Overall I believe her kidneys are failing, eGFR is  now 9 ml/min. Fortunately she is not sig uremic yet. I have d/w pt's daughter, they will be back in town tomorrow. There is nothing we can do about her renal failure. Would get palliative care team involved.  Dementia DNR HTN /vol - vol as above. Have lowered coreg to 12.5 bid for now, holding norvasc. BP's are good.  CADsp CABG PAF  SP PPM      Rob Kyndle Schlender 04/23/2022, 4:12 PM   Recent Labs  Lab 04/21/22 1641 04/21/22 1847 04/22/22 0029 04/23/22 0423  HGB  --    < > 7.7* 7.5*  ALBUMIN 2.9*  --  2.9*  --   CALCIUM 7.8*  --  7.8* 8.0*  CREATININE 4.43*  --  4.53* 4.60*  K 4.7  --  4.7 4.5   < > = values in this interval not displayed.   No results for input(s): "IRON", "TIBC", "FERRITIN" in the last 168 hours. Inpatient medications:  aspirin EC  81 mg Oral Daily   carvedilol  12.5 mg Oral BID WC   Chlorhexidine Gluconate Cloth  6 each Topical Daily   enoxaparin (LOVENOX)  injection  30 mg Subcutaneous Q24H   feeding supplement  1 Container Oral TID BM   furosemide  80 mg Intravenous Q8H   insulin aspart  0-9 Units Subcutaneous TID WC   isosorbide mononitrate  30 mg Oral Daily   levothyroxine  100 mcg Oral QHS   multivitamin with minerals  1 tablet Oral Daily   pantoprazole  40 mg Oral QAC breakfast   rosuvastatin  10 mg Oral QHS   sertraline  25 mg Oral Daily   sodium chloride flush  10-40 mL Intracatheter Q12H   sodium chloride flush  3 mL Intravenous Q12H    acetaminophen **OR** acetaminophen, albuterol, polyethylene glycol, polyvinyl alcohol, sodium chloride, sodium chloride flush

## 2022-04-23 NOTE — Progress Notes (Signed)
Initial Nutrition Assessment  DOCUMENTATION CODES:   Non-severe (moderate) malnutrition in context of chronic illness  INTERVENTION:  Liberalize diet from a renal/carb modified to a carb modified/2 gram sodium to provide widest variety of menu options to enhance nutritional adequacy Boost Breeze po TID, each supplement provides 250 kcal and 9 grams of protein MVI with minerals daily  NUTRITION DIAGNOSIS:   Moderate Malnutrition related to chronic illness (CKD, CHF) as evidenced by energy intake < or equal to 75% for > or equal to 1 month, mild fat depletion, mild muscle depletion.  GOAL:   Patient will meet greater than or equal to 90% of their needs  MONITOR:   PO intake, Supplement acceptance, Diet advancement, Labs, Weight trends, I & O's  REASON FOR ASSESSMENT:   Malnutrition Screening Tool    ASSESSMENT:   Pt admitted from SNF with abnormal labs and elevated BUN, found to have acute renal failure superimposed on CKD stage 4. PMH significant for CAD s/p CABG, GERD, paroxysmal afib, SSS s/p PPM, anemia, diabetes, neuropathy, HLD, HTN, hypothyroidism, CKD 4, diastolic CHF, depression and GIB.  Per MD, plans to monitor renal labs and volume status. Pt is not a candidate for HD.   Pt resting at time of visit. She awoke to her name. She endorses having a poor appetite for "a while." Pt was previously admitted for L hip fracture 1 month ago. Since then she has been in rehab and recalls eating maybe 50% of 1 meal each day as she just does not feel like eating. During admission she states that she has been trying to eat a small bit of each meal. She does not like Ensure or Boost. Noted pt was ordered Boost Breeze during last admission but she does not recall trying them. She is agreeable to trying during this admission and is aware these apply toward her fluid restriction.   Meal completions: 06/29: 20%-lunch 06/30: 30%-breakfast  She was unable to determine whether she has had  recent wt loss as she recalls gaining wt PTA. She states that her admit wt was higher than her usual wt but reports that her admit wt was 123 lbs and her usual wt is 125 lbs. Pt noted to have h/o dementia which is likely contributing factor for conflicting wt history.   Reviewed wt history. Her admit wt is +2.8 kg from documented wt on 05/25. This is likely d/t volume status as she has a h/o CHF and CKD.   Edema: moderate pitting BLE  Medications: lasix, SSI 0-9 units TID, synthroid, protonix  Labs: sodium 130, potassium 4.5 (wnl), BUN 109, Cr 4.60,  corrected calcium 8.88, GFR 9, HgbA1c 6.6%, CBG's 128-187 x24 hours   I/O's: -544m since admission   NUTRITION - FOCUSED PHYSICAL EXAM:  Flowsheet Row Most Recent Value  Orbital Region Mild depletion  Upper Arm Region Mild depletion  Thoracic and Lumbar Region No depletion  Buccal Region No depletion  Temple Region Mild depletion  Clavicle Bone Region No depletion  Clavicle and Acromion Bone Region No depletion  Scapular Bone Region No depletion  Dorsal Hand Mild depletion  Patellar Region Mild depletion  Anterior Thigh Region Mild depletion  Posterior Calf Region Mild depletion  Edema (RD Assessment) None  Hair Reviewed  Eyes Reviewed  Mouth Reviewed  Skin Reviewed  Nails Reviewed      Diet Order:   Diet Order             Diet Carb Modified Fluid consistency: Thin; Room  service appropriate? Yes; Fluid restriction: 1200 mL Fluid  Diet effective now                   EDUCATION NEEDS:   Education needs have been addressed  Skin:  Skin Assessment: Reviewed RN Assessment  Last BM:  6/30 (type 3)  Height:   Ht Readings from Last 1 Encounters:  04/21/22 5' (1.524 m)    Weight:   Wt Readings from Last 1 Encounters:  04/23/22 60.6 kg   BMI:  Body mass index is 26.09 kg/m.  Estimated Nutritional Needs:   Kcal:  1400-1600  Protein:  70-85g  Fluid:  >/=1.4L  Clayborne Dana, RDN, LDN Clinical  Nutrition

## 2022-04-23 NOTE — Hospital Course (Signed)
[name] is a 81 y.o. female with medical history significant of CAD status post CABG, GERD, paroxysmal A-fib, sick sinus syndrome status post pacemaker, anemia, diabetes, neuropathy, hyperlipidemia, hypertension, hypothyroidism, CKD 4, diastolic CHF, depression, GI bleed presenting with elevated BUN from SNF. Labs here indicated [name] and she appeared volume overloaded. Started on IV lasix and admitted with nephrology consultation.  **Interim History   Nephrology evaluated and recommending increasing Lasix to IV 80 mg 3 times daily and following closely.  They recommended getting a UA, urine lites and renal ultrasound and placing a Foley catheter.  Currently she is not a candidate for hemodialysis and family was made aware by the nephrology team.

## 2022-04-23 NOTE — Progress Notes (Signed)
PROGRESS NOTE    [name] PISARSKI  TIR:[id number] DOB: [birth year] DOA: 04/21/2022 PCP: Leeroy Cha, MD   Brief Narrative:  [name] is a 81 y.o. female with medical history significant of CAD status post CABG, GERD, paroxysmal A-fib, sick sinus syndrome status post pacemaker, anemia, diabetes, neuropathy, hyperlipidemia, hypertension, hypothyroidism, CKD 4, diastolic CHF, depression, GI bleed presenting with elevated BUN from SNF. Labs here indicated [name] and she appeared volume overloaded. Started on IV lasix and admitted with nephrology consultation.  **Interim History   Nephrology evaluated and recommending increasing Lasix to IV 80 mg 3 times daily and following closely.  They recommended getting a UA, urine lites and renal ultrasound and placing a Foley catheter.  Currently she is not a candidate for hemodialysis and family was made aware by the nephrology team.    Assessment and Plan: No notes have been filed under this hospital service. Service: Hospitalist  [name] on CKD 4 and acute on chronic diastolic heart failure Metabolic acidosis Hyponatremia in the setting of hypervolemia -SCr 4.43 from baseline of around 2.6. BNP >1200 w/LE edema in setting of decreased diuretic dosing at SNF.  - Insufficient diuresis with lasix '40mg'$  IV, augment per nephrology to '80mg'$  IV TID.  - Check U/S, UA micro, lytes and these are pending.  Renal ultrasound done and showed "No hydronephrosis. Bilateral renal parenchymal atrophy with echogenic parenchyma, a nonspecific indicator of medical renal disease." -Urinalysis done and showed turbid appearance with moderate hemoglobin, negative ketones, moderate leukocytes, 100 protein's, many bacteria, present mucus, 6-10 RBCs per high-power field, greater than 50 WBCs - Pt said not to be candidate for HD, family aware.  - Strict I/O, daily weights.  - Renal diet with fluid restriction -Patient's BUNs/creatinine went from 105/4.53 is now  109/4.60 -She has a mild metabolic acidosis with a CO2 of 20, anion gap of 13, chloride level of 97; sodium went from 132 is now 130   Hyperlipidemia CAD status post CABG and stenting - Continue home rosuvastatin - Continue home carvedilol - Continue home Imdur - Continue home aspirin  GERD - Continue PPI  Paroxysmal A-fib, SSS s/p PPM:  -Paced rhythm noted.  Anemia of CKD:  -Patient's hemoglobin/hematocrit went from 7.7/23.7 and is now 7.5/23.3 -Check anemia panel in a.m. -Continue monitor for signs symptoms bleeding; no overt bleeding noted -Repeat CBC in the a.m.  T2DM with diabetic neuropathy:  - SSI -CBGs ranging from 128-187 - Hold gabapentin with CrCl < 6m/min.   Hypertension - Holding home amlodipine in the setting of low normal blood pressure, decrease coreg. -Continue to monitor blood pressures per protocol  Hypothyroid - Continue home Levothyroxine  Depression, Dementia - Continue home sertraline, delirium precautions   DNR: POA  DVT prophylaxis: enoxaparin (LOVENOX) injection 30 mg Start: 04/21/22 2200    Code Status: DNR Family Communication: No family currently at bedside  Disposition Plan:  Level of care: Telemetry Status is: Inpatient Remains inpatient appropriate because: Needs further nephrology clearance and improvement in renal function   Consultants:  Nephrology  Procedures:  None  Antimicrobials:  Anti-infectives (From admission, onward)    None       Subjective: Seen and examined at bedside and she is sitting in a chair and appears little confused.  No nausea or vomiting.  Denies any lightheadedness or dizziness.  No other concerns or complaints at this time.  Objective: Vitals:   04/22/22 2110 04/23/22 0500 04/23/22 0523 04/23/22 1200  BP: (!) 117/49  (!) 132/40 (Marland Kitchen  139/47  Pulse: 62  (!) 59 63  Resp: '16  16 18  '$ Temp: 98.2 F (36.8 C)  98.1 F (36.7 C) 97.7 F (36.5 C)  TempSrc: Oral  Oral Oral  SpO2: 97%  96% 96%   Weight:  60.6 kg    Height:        Intake/Output Summary (Last 24 hours) at 04/23/2022 1440 Last data filed at 04/23/2022 1300 Gross per 24 hour  Intake 170 ml  Output 1000 ml  Net -830 ml   Filed Weights   04/21/22 1637 04/23/22 0500  Weight: 58 kg 60.6 kg   Examination: Physical Exam:  Constitutional: WN/WD overweight elderly pleasantly demented Caucasian female currently in no acute distress Respiratory: Diminished to auscultation bilaterally with coarse breath sounds, no wheezing, rales, rhonchi or crackles. Normal respiratory effort and patient is not tachypenic. No accessory muscle use.  Unlabored breathing Cardiovascular: RRR, no murmurs / rubs / gallops. S1 and S2 auscultated.  Has 1+ lower extremity edema Abdomen: Soft, non-tender, distended secondary body habitus. Bowel sounds positive.  GU: Deferred. Musculoskeletal: No clubbing / cyanosis of digits/nails. No joint deformity upper and lower extremities.  Skin: No rashes, lesions, ulcers on limited skin evaluation. No induration; Warm and dry.  Neurologic: CN 2-12 grossly intact with no focal deficits.  Romberg sign and cerebellar reflexes not assessed.  Psychiatric: Normal judgment and insight.  She is awake but not fully alert and oriented  Data Reviewed: I have personally reviewed following labs and imaging studies  CBC: Recent Labs  Lab 04/21/22 1847 04/22/22 0029 04/23/22 0423  WBC 8.9 8.8 8.2  NEUTROABS 7.3  --   --   HGB 7.5* 7.7* 7.5*  HCT 23.0* 23.7* 23.3*  MCV 100.4* 100.9* 100.9*  PLT 153 160 024   Basic Metabolic Panel: Recent Labs  Lab 04/21/22 1641 04/22/22 0029 04/23/22 0423  NA 131* 132* 130*  K 4.7 4.7 4.5  CL 99 98 97*  CO2 20* 20* 20*  GLUCOSE 226* 165* 131*  BUN 100* 105* 109*  CREATININE 4.43* 4.53* 4.60*  CALCIUM 7.8* 7.8* 8.0*  MG  --  2.1  --    GFR: Estimated Creatinine Clearance: 7.7 mL/min (A) (by C-G formula based on SCr of 4.6 mg/dL (H)). Liver Function Tests: Recent  Labs  Lab 04/21/22 1641 04/22/22 0029  AST 38 42*  ALT 25 26  ALKPHOS 217* 219*  BILITOT 0.7 0.7  PROT 5.9* 5.9*  ALBUMIN 2.9* 2.9*   No results for input(s): "LIPASE", "AMYLASE" in the last 168 hours. No results for input(s): "AMMONIA" in the last 168 hours. Coagulation Profile: No results for input(s): "INR", "PROTIME" in the last 168 hours. Cardiac Enzymes: No results for input(s): "CKTOTAL", "CKMB", "CKMBINDEX", "TROPONINI" in the last 168 hours. BNP (last 3 results) No results for input(s): "PROBNP" in the last 8760 hours. HbA1C: No results for input(s): "HGBA1C" in the last 72 hours. CBG: Recent Labs  Lab 04/22/22 1156 04/22/22 1640 04/22/22 2106 04/23/22 0749 04/23/22 1151  GLUCAP 156* 151* 154* 128* 170*   Lipid Profile: No results for input(s): "CHOL", "HDL", "LDLCALC", "TRIG", "CHOLHDL", "LDLDIRECT" in the last 72 hours. Thyroid Function Tests: No results for input(s): "TSH", "T4TOTAL", "FREET4", "T3FREE", "THYROIDAB" in the last 72 hours. Anemia Panel: No results for input(s): "VITAMINB12", "FOLATE", "FERRITIN", "TIBC", "IRON", "RETICCTPCT" in the last 72 hours. Sepsis Labs: No results for input(s): "PROCALCITON", "LATICACIDVEN" in the last 168 hours.  Recent Results (from the past 240 hour(s))  MRSA Next Gen  by PCR, Nasal     Status: None   Collection Time: April 24, 2022  2:02 PM   Specimen: Nasal Mucosa; Nasal Swab  Result Value Ref Range Status   MRSA by PCR Next Gen NOT DETECTED NOT DETECTED Final    Comment: (NOTE) The GeneXpert MRSA Assay (FDA approved for NASAL specimens only), is one component of a comprehensive MRSA colonization surveillance program. It is not intended to diagnose MRSA infection nor to guide or monitor treatment for MRSA infections. Test performance is not FDA approved in patients less than 64 years old. Performed at [facility], [place] [address], [place], [place] [address]     Radiology Studies: US  RENAL  Result Date: 2022/04/24 CLINICAL DATA:  Acute renal failure, stage IV EXAM: RENAL / URINARY TRACT ULTRASOUND COMPLETE COMPARISON:  03/21/2022 and previous FINDINGS: Right Kidney: Renal measurements: 9.6 x 5.1 x 5.3 cm = volume: 134 mL. Parenchyma thin, echogenic compared to the adjacent liver. No hydronephrosis. 0.6 cm exophytic nodule or cyst from the upper pole. Left Kidney: Renal measurements: 8.5 x 4.5 x 3.8 cm = volume: 76 mL. Cortical thinning. No hydronephrosis. 1.8 cm probable cyst exophytic from the midpole. Bladder: Appears normal for degree of bladder distention. Other: None. IMPRESSION: 1. No hydronephrosis. 2. Bilateral renal parenchymal atrophy with echogenic parenchyma, a nonspecific indicator of medical renal disease. Electronically Signed   By: Lucrezia Europe M.D.   On: 2022-04-24 16:03   DG Chest 1 View  Result Date: 04/21/2022 CLINICAL DATA:  Provided history: Abnormal labs. EXAM: CHEST  1 VIEW COMPARISON:  Prior chest radiographs 02/10/2022 and earlier. FINDINGS: Left chest dual lead implantable cardiac device. Prior median sternotomy/CABG. Mild cardiomegaly. Aortic atherosclerosis. Ill-defined opacity within the medial right lung base. No appreciable pulmonary edema. No evidence of pleural effusion or pneumothorax. No acute bony abnormality identified. Chronic deformity of the proximal left humerus. IMPRESSION: Opacity within the medial right lung base suspicious for pneumonia. Followup PA and lateral chest radiographs are recommended in 3-4 weeks following trial of antibiotic therapy to ensure resolution and exclude underlying malignancy. Cardiomegaly. Aortic Atherosclerosis (ICD10-I70.0). Electronically Signed   By: Kellie Simmering D.O.   On: 04/21/2022 18:51    Scheduled Meds:  aspirin EC  81 mg Oral Daily   carvedilol  12.5 mg Oral BID WC   Chlorhexidine Gluconate Cloth  6 each Topical Daily   enoxaparin (LOVENOX) injection  30 mg Subcutaneous Q24H   furosemide  80 mg  Intravenous Q8H   insulin aspart  0-9 Units Subcutaneous TID WC   isosorbide mononitrate  30 mg Oral Daily   levothyroxine  100 mcg Oral QHS   pantoprazole  40 mg Oral QAC breakfast   rosuvastatin  10 mg Oral QHS   sertraline  25 mg Oral Daily   sodium chloride flush  10-40 mL Intracatheter Q12H   sodium chloride flush  3 mL Intravenous Q12H   Continuous Infusions:   LOS: 1 day   Raiford Noble, DO [facility] Available via Epic secure chat 7am-7pm After these hours, please refer to coverage provider listed on [email or web address] 04/23/2022, 2:40 PM

## 2022-04-24 DIAGNOSIS — Z7189 Other specified counseling: Secondary | ICD-10-CM | POA: Diagnosis not present

## 2022-04-24 DIAGNOSIS — R531 Weakness: Secondary | ICD-10-CM | POA: Diagnosis not present

## 2022-04-24 DIAGNOSIS — Z515 Encounter for palliative care: Secondary | ICD-10-CM | POA: Diagnosis not present

## 2022-04-24 DIAGNOSIS — I5033 Acute on chronic diastolic (congestive) heart failure: Secondary | ICD-10-CM | POA: Diagnosis not present

## 2022-04-24 DIAGNOSIS — N179 Acute kidney failure, unspecified: Secondary | ICD-10-CM | POA: Diagnosis not present

## 2022-04-24 DIAGNOSIS — K2100 Gastro-esophageal reflux disease with esophagitis, without bleeding: Secondary | ICD-10-CM | POA: Diagnosis not present

## 2022-04-24 DIAGNOSIS — D638 Anemia in other chronic diseases classified elsewhere: Secondary | ICD-10-CM | POA: Diagnosis not present

## 2022-04-24 LAB — COMPREHENSIVE METABOLIC PANEL
ALT: 23 U/L (ref 0–44)
AST: 32 U/L (ref 15–41)
Albumin: 2.9 g/dL — ABNORMAL LOW (ref 3.5–5.0)
Alkaline Phosphatase: 216 U/L — ABNORMAL HIGH (ref 38–126)
Anion gap: 13 (ref 5–15)
BUN: 98 mg/dL — ABNORMAL HIGH (ref 8–23)
CO2: 19 mmol/L — ABNORMAL LOW (ref 22–32)
Calcium: 8.4 mg/dL — ABNORMAL LOW (ref 8.9–10.3)
Chloride: 101 mmol/L (ref 98–111)
Creatinine, Ser: 4.64 mg/dL — ABNORMAL HIGH (ref 0.44–1.00)
GFR, Estimated: 9 mL/min — ABNORMAL LOW (ref >60)
Glucose, Bld: 142 mg/dL — ABNORMAL HIGH (ref 70–99)
Potassium: 4.4 mmol/L (ref 3.5–5.1)
Sodium: 133 mmol/L — ABNORMAL LOW (ref 135–145)
Total Bilirubin: 0.9 mg/dL (ref 0.3–1.2)
Total Protein: 5.8 g/dL — ABNORMAL LOW (ref 6.5–8.1)

## 2022-04-24 LAB — MAGNESIUM: Magnesium: 2.3 mg/dL (ref 1.7–2.4)

## 2022-04-24 LAB — CBC WITH DIFFERENTIAL/PLATELET
Abs Immature Granulocytes: 0.03 10*3/uL (ref 0.00–0.07)
Basophils Absolute: 0 10*3/uL (ref 0.0–0.1)
Basophils Relative: 0 %
Eosinophils Absolute: 0.1 10*3/uL (ref 0.0–0.5)
Eosinophils Relative: 2 %
HCT: 23 % — ABNORMAL LOW (ref 36.0–46.0)
Hemoglobin: 7.5 g/dL — ABNORMAL LOW (ref 12.0–15.0)
Immature Granulocytes: 1 %
Lymphocytes Relative: 11 %
Lymphs Abs: 0.7 10*3/uL (ref 0.7–4.0)
MCH: 32.8 pg (ref 26.0–34.0)
MCHC: 32.6 g/dL (ref 30.0–36.0)
MCV: 100.4 fL — ABNORMAL HIGH (ref 80.0–100.0)
Monocytes Absolute: 0.5 10*3/uL (ref 0.1–1.0)
Monocytes Relative: 8 %
Neutro Abs: 4.8 10*3/uL (ref 1.7–7.7)
Neutrophils Relative %: 78 %
Platelets: 178 10*3/uL (ref 150–400)
RBC: 2.29 MIL/uL — ABNORMAL LOW (ref 3.87–5.11)
RDW: 17.2 % — ABNORMAL HIGH (ref 11.5–15.5)
WBC: 6.1 10*3/uL (ref 4.0–10.5)
nRBC: 0 % (ref 0.0–0.2)

## 2022-04-24 LAB — GLUCOSE, CAPILLARY
Glucose-Capillary: 145 mg/dL — ABNORMAL HIGH (ref 70–99)
Glucose-Capillary: 149 mg/dL — ABNORMAL HIGH (ref 70–99)
Glucose-Capillary: 163 mg/dL — ABNORMAL HIGH (ref 70–99)
Glucose-Capillary: 171 mg/dL — ABNORMAL HIGH (ref 70–99)
Glucose-Capillary: 143 mg/dL — ABNORMAL HIGH (ref 70–99)

## 2022-04-24 LAB — PHOSPHORUS: Phosphorus: 8.1 mg/dL — ABNORMAL HIGH (ref 2.5–4.6)

## 2022-04-24 MED ORDER — FUROSEMIDE 10 MG/ML IJ SOLN
120.0000 mg | Freq: Three times a day (TID) | INTRAVENOUS | Status: AC
  Administered 2022-04-24 – 2022-04-25 (×3): 120 mg via INTRAVENOUS
  Filled 2022-04-24: qty 10
  Filled 2022-04-24: qty 12
  Filled 2022-04-24 (×2): qty 10

## 2022-04-24 MED ORDER — METOLAZONE 5 MG PO TABS
5.0000 mg | ORAL_TABLET | ORAL | Status: DC
  Administered 2022-04-24 – 2022-05-01 (×8): 5 mg via ORAL
  Filled 2022-04-24 (×9): qty 1

## 2022-04-24 NOTE — Evaluation (Signed)
Physical Therapy Evaluation Patient Details Name: [name] MRN: [id number] DOB: 07-19-1941 Today's Date: 04/24/2022  History of Present Illness  81 y.o. female with medical history significant of CAD status post CABG, GERD, paroxysmal A-fib, sick sinus syndrome status post pacemaker, anemia, diabetes, neuropathy, hyperlipidemia, hypertension, hypothyroidism, CKD 4, diastolic CHF, depression, GI bleed, s/p L IM nail 03/18/22 and presenting with elevated BUN from SNF.  Pt admitted 04/21/22 for [name] on CKD 4 and acute on chronic diastolic heart failure.  Per nephrology note: "EOL - pts renal function likely is declining towards esrd. Pt is not a HD candidate. Family will be back in town today. Appreciate palliative care assistance."  Clinical Impression  Pt admitted with above diagnosis.  Pt currently with functional limitations due to the deficits listed below (see PT Problem List). Pt will benefit from skilled PT to increase their independence and safety with mobility to allow discharge to the venue listed below.  Pt assisted OOB to recliner.  Pt reports she is from facility and was not certain if she was still working with physical therapy (s/p L IM Nail 5/25).  Pt states she has not ambulated but was assisted OOB to w/c with assist prior to admission.  Currently recommend pt return to SNF.  Palliative care consult also pending.        Recommendations for follow up therapy are one component of a multi-disciplinary discharge planning process, led by the attending physician.  Recommendations may be updated based on patient status, additional functional criteria and insurance authorization.  Follow Up Recommendations Skilled nursing-short term rehab (<3 hours/day) Can patient physically be transported by private vehicle: No    Assistance Recommended at Discharge Frequent or constant Supervision/Assistance  Patient can return home with the following  A lot of help with walking and/or transfers;A lot  of help with bathing/dressing/bathroom    Equipment Recommendations None recommended by PT  Recommendations for Other Services       Functional Status Assessment Patient has had a recent decline in their functional status and demonstrates the ability to make significant improvements in function in a reasonable and predictable amount of time.     Precautions / Restrictions Precautions Precautions: Fall Restrictions Weight Bearing Restrictions: No      Mobility  Bed Mobility Overal bed mobility: Needs Assistance Bed Mobility: Supine to Sit     Supine to sit: Mod assist, HOB elevated     General bed mobility comments: assist for L LE, trunk upright, scooting to EOB utilizing bed pad    Transfers Overall transfer level: Needs assistance Equipment used: Rolling walker (2 wheels) Transfers: Sit to/from Stand, Bed to chair/wheelchair/BSC Sit to Stand: Mod assist, +2 safety/equipment Stand pivot transfers: Min assist, +2 safety/equipment         General transfer comment: multimodal cues for technique required 3 attempts to assist with stand, cues for pivot to recliner, reliant on UE support    Ambulation/Gait                  Stairs            Wheelchair Mobility    Modified Rankin (Stroke Patients Only)       Balance Overall balance assessment: History of Falls                                           Pertinent Vitals/Pain  Pain Assessment Pain Assessment: No/denies pain    Home Living Family/patient expects to be discharged to:: Skilled nursing facility                        Prior Function Prior Level of Function : Needs assist             Mobility Comments: pt reports she is from a facility, appears pt was home until fall and s/p L IM nail in May, pt states facility assisted her with transfer to w/c       Hand Dominance        Extremity/Trunk Assessment        Lower Extremity Assessment Lower  Extremity Assessment: Generalized weakness;LLE deficits/detail LLE Deficits / Details: pt reports increased weakness in L LE since surgery       Communication   Communication: HOH  Cognition Arousal/Alertness: Awake/alert Behavior During Therapy: WFL for tasks assessed/performed Overall Cognitive Status: History of cognitive impairments - at baseline                                 General Comments: hx dementia, HOH however appropriate and follows simple commands        General Comments      Exercises     Assessment/Plan    PT Assessment Patient needs continued PT services  PT Problem List Decreased strength;Decreased activity tolerance;Decreased mobility;Decreased knowledge of use of DME;Decreased balance       PT Treatment Interventions Gait training;DME instruction;Therapeutic exercise;Functional mobility training;Therapeutic activities;Patient/family education;Balance training    PT Goals (Current goals can be found in the Care Plan section)  Acute Rehab PT Goals PT Goal Formulation: With patient Time For Goal Achievement: 05/08/22 Potential to Achieve Goals: Fair    Frequency Min 2X/week     Co-evaluation               AM-PAC PT "6 Clicks" Mobility  Outcome Measure Help needed turning from your back to your side while in a flat bed without using bedrails?: A Lot Help needed moving from lying on your back to sitting on the side of a flat bed without using bedrails?: A Lot Help needed moving to and from a bed to a chair (including a wheelchair)?: A Lot Help needed standing up from a chair using your arms (e.g., wheelchair or bedside chair)?: Total Help needed to walk in hospital room?: Total Help needed climbing 3-5 steps with a railing? : Total 6 Click Score: 9    End of Session Equipment Utilized During Treatment: Gait belt Activity Tolerance: Patient limited by fatigue Patient left: in chair;with call bell/phone within reach;with chair  alarm set        Time: 0953-1009 PT Time Calculation (min) (ACUTE ONLY): 16 min   Charges:   PT Evaluation $PT Eval Low Complexity: 1 Low        Kati PT, DPT Physical Therapist Acute Rehabilitation Services Preferred contact method: Secure Chat Weekend Pager Only: [phone] Office: [place] 04/24/2022, 11:41 AM

## 2022-04-24 NOTE — Progress Notes (Addendum)
PROGRESS NOTE    [name]  RSW:[id number] DOB: 1940/12/03 DOA: 04/21/2022 PCP: Leeroy Cha, MD   Brief Narrative:  [name] is a 81 y.o. female with medical history significant of CAD status post CABG, GERD, paroxysmal A-fib, sick sinus syndrome status post pacemaker, anemia, diabetes, neuropathy, hyperlipidemia, hypertension, hypothyroidism, CKD 4, diastolic CHF, depression, GI bleed presenting with elevated BUN from SNF. Labs here indicated [name] and she appeared volume overloaded. Started on IV lasix and admitted with nephrology consultation.  **Interim History   Nephrology evaluated and recommending increasing Lasix to IV 80 mg 3 times daily and following closely.  They recommended getting a UA, urine lites and renal ultrasound and placing a Foley catheter.  Currently she is not a candidate for hemodialysis and family was made aware by the nephrology team.  Renal function has not really improving with current treatment yesterday stable.  She only had 750 mL of total urine output yesterday.  Nephrology is planning to escalate her IV doses of Lasix to 100 mg 3 times daily and adding metolazone 5 mg daily.  Palliative care has been consulted for further goals of care assistance given that we are concerned that patient's renal function is declining towards ESRD and that she is not a HD candidate.   Assessment and Plan: No notes have been filed under this hospital service. Service: Hospitalist  [name] on CKD 4 and acute on chronic diastolic heart failure Metabolic acidosis Hyponatremia in the setting of hypervolemia -SCr 4.43 from baseline of around 2.6. BNP >1200 w/LE edema in setting of decreased diuretic dosing at SNF.  - Insufficient diuresis with lasix '40mg'$  IV, augment per nephrology to '80mg'$  IV TID but now further increased to 120 mg TID and adding Metolazone 5 mg po Daily .  - Check U/S, UA micro, lytes and these are pending.  Renal ultrasound done and showed "No  hydronephrosis. Bilateral renal parenchymal atrophy with echogenic parenchyma, a nonspecific indicator of medical renal disease." -Urinalysis done and showed turbid appearance with moderate hemoglobin, negative ketones, moderate leukocytes, 100 protein's, many bacteria, present mucus, 6-10 RBCs per high-power field, greater than 50 WBCs - Pt said not to be candidate for HD, family aware.  - Strict I/O, daily weights. She is -1.125 Liters since admission  - Renal diet with fluid restriction -Patient's BUNs/creatinine went from 105/4.53 -> 109/4.60 -> 98/4.64 -She has a mild metabolic acidosis with a CO2 of 19, anion gap of 13, chloride level of 97; sodium went from 132 -> 130 -> 133 -Repeat CXR in the AM    Hyperlipidemia CAD status post CABG and stenting - Continue home Rosuvastatin - Continue home Carvedilol - Continue home Imdur - Continue home Aspirin  GERD/GI Prophylaxis  - Continue PPI with Pantoprazole 40 mg po qHS  Paroxysmal A-fib, SSS s/p PPM:  -Paced rhythm noted.  Anemia of CKD:  -Patient's hemoglobin/hematocrit went from 7.7/23.7 -> 7.5/23.3 -> 7.5/23.0 -Check Anemia Panel in a.m. -Continue monitor for signs symptoms bleeding; no overt bleeding noted -Repeat CBC in the a.m.  T2DM with diabetic neuropathy:  - SSI -CBGs ranging from 130-158 - Hold gabapentin with CrCl < 65m/min.   Hypertension - Holding home amlodipine in the setting of low normal blood pressure, decrease coreg. -Continue to monitor blood pressures per protocol  Hypothyroid - Continue home Levothyroxine  Depression, Dementia - Continue home sertraline, delirium precautions   [place]   DVT prophylaxis: enoxaparin (LOVENOX) injection 30  mg Start: 04/21/22 2200    Code Status: DNR Family Communication: No family present at bedside and called Daughter [name] at 18:56 with no response  Disposition Plan:  Level of care:  Telemetry Status is: Inpatient Remains inpatient appropriate because: Needs further goals of care discussion and clearance by nephrology   Consultants:  Nephrology Palliative Care  Procedures:  None  Antimicrobials:  Anti-infectives (From admission, onward)    None       Subjective: Seen and examined at bedside she was sitting in the chair and states that she did not sleep very well last night.  States that she got up 3 hours earlier than she normally does.  States that she feels okay.  She states that she cannot remember if she had a bowel movement or not.  No lightheadedness or dizziness.  No other concerns or complaints at this time.  Objective: Vitals:   04/23/22 1200 04/23/22 2015 04/24/22 0541 04/24/22 1115  BP: (!) 139/47 (!) 124/47 (!) 118/48 (!) 128/108  Pulse: 63 69 63 65  Resp: '18 16 16   '$ Temp: 97.7 F (36.5 C) 98.6 F (37 C) 98 F (36.7 C) 98.1 F (36.7 C)  TempSrc: Oral Oral Oral Oral  SpO2: 96% 98% 98% 97%  Weight:      Height:        Intake/Output Summary (Last 24 hours) at 04/24/2022 1214 Last data filed at 04/24/2022 8676 Gross per 24 hour  Intake 240 ml  Output 975 ml  Net -735 ml   Filed Weights   04/21/22 1637 04/23/22 0500  Weight: 58 kg 60.6 kg   Examination: Physical Exam:  Constitutional: WN/WD overweight elderly pleasantly demented Caucasian female currently no acute distress Respiratory: Diminished to auscultation bilaterally with coarse breath sounds, no wheezing, rales, rhonchi or crackles. Normal respiratory effort and patient is not tachypenic. No accessory muscle use.  Unlabored breathing Cardiovascular: RRR, no murmurs / rubs / gallops. S1 and S2 auscultated.  Has some slight lower extremity edema Abdomen: Soft, non-tender, distended secondary body habitus. Bowel sounds positive.  GU: Deferred. Musculoskeletal: No clubbing / cyanosis of digits/nails. No joint deformity upper and lower extremities..  Neurologic: CN 2-12 grossly  intact with no focal deficits. Romberg sign and cerebellar reflexes not assessed.  Psychiatric: Normal judgment and insight.  She is awake and not fully alert and oriented  Data Reviewed: I have personally reviewed following labs and imaging studies  CBC: Recent Labs  Lab 04/21/22 1847 04/22/22 0029 04/23/22 0423 04/24/22 0450  WBC 8.9 8.8 8.2 6.1  NEUTROABS 7.3  --   --  4.8  HGB 7.5* 7.7* 7.5* 7.5*  HCT 23.0* 23.7* 23.3* 23.0*  MCV 100.4* 100.9* 100.9* 100.4*  PLT 153 160 187 195   Basic Metabolic Panel: Recent Labs  Lab 04/21/22 1641 04/22/22 0029 04/23/22 0423 04/24/22 0450  NA 131* 132* 130* 133*  K 4.7 4.7 4.5 4.4  CL 99 98 97* 101  CO2 20* 20* 20* 19*  GLUCOSE 226* 165* 131* 142*  BUN 100* 105* 109* 98*  CREATININE 4.43* 4.53* 4.60* 4.64*  CALCIUM 7.8* 7.8* 8.0* 8.4*  MG  --  2.1  --  2.3  PHOS  --   --   --  8.1*   GFR: Estimated Creatinine Clearance: 7.6 mL/min (A) (by C-G formula based on SCr of 4.64 mg/dL (H)). Liver Function Tests: Recent Labs  Lab 04/21/22 1641 04/22/22 0029 04/24/22 0450  AST 38 42* 32  ALT 25 26 23  ALKPHOS 217* 219* 216*  BILITOT 0.7 0.7 0.9  PROT 5.9* 5.9* 5.8*  ALBUMIN 2.9* 2.9* 2.9*   No results for input(s): "LIPASE", "AMYLASE" in the last 168 hours. No results for input(s): "AMMONIA" in the last 168 hours. Coagulation Profile: No results for input(s): "INR", "PROTIME" in the last 168 hours. Cardiac Enzymes: No results for input(s): "CKTOTAL", "CKMB", "CKMBINDEX", "TROPONINI" in the last 168 hours. BNP (last 3 results) No results for input(s): "PROBNP" in the last 8760 hours. HbA1C: No results for input(s): "HGBA1C" in the last 72 hours. CBG: Recent Labs  Lab 04/23/22 1151 04/23/22 1643 04/23/22 2011 04/24/22 0736 04/24/22 1102  GLUCAP 170* 130* 158* 145* 149*   Lipid Profile: No results for input(s): "CHOL", "HDL", "LDLCALC", "TRIG", "CHOLHDL", "LDLDIRECT" in the last 72 hours. Thyroid Function Tests: No  results for input(s): "TSH", "T4TOTAL", "FREET4", "T3FREE", "THYROIDAB" in the last 72 hours. Anemia Panel: No results for input(s): "VITAMINB12", "FOLATE", "FERRITIN", "TIBC", "IRON", "RETICCTPCT" in the last 72 hours. Sepsis Labs: No results for input(s): "PROCALCITON", "LATICACIDVEN" in the last 168 hours.  Recent Results (from the past 240 hour(s))  MRSA Next Gen by PCR, Nasal     Status: None   Collection Time: 04/22/22  2:02 PM   Specimen: Nasal Mucosa; Nasal Swab  Result Value Ref Range Status   MRSA by PCR Next Gen NOT DETECTED NOT DETECTED Final    Comment: (NOTE) The GeneXpert MRSA Assay (FDA approved for NASAL specimens only), is one component of a comprehensive MRSA colonization surveillance program. It is not intended to diagnose MRSA infection nor to guide or monitor treatment for MRSA infections. Test performance is not FDA approved in patients less than 46 years old. Performed at [facility], [place] [address], [place],  [address]     Radiology Studies: US RENAL  Result Date: 04/22/2022 CLINICAL DATA:  Acute renal failure, stage IV EXAM: RENAL / URINARY TRACT ULTRASOUND COMPLETE COMPARISON:  03/21/2022 and previous FINDINGS: Right Kidney: Renal measurements: 9.6 x 5.1 x 5.3 cm = volume: 134 mL. Parenchyma thin, echogenic compared to the adjacent liver. No hydronephrosis. 0.6 cm exophytic nodule or cyst from the upper pole. Left Kidney: Renal measurements: 8.5 x 4.5 x 3.8 cm = volume: 76 mL. Cortical thinning. No hydronephrosis. 1.8 cm probable cyst exophytic from the midpole. Bladder: Appears normal for degree of bladder distention. Other: None. IMPRESSION: 1. No hydronephrosis. 2. Bilateral renal parenchymal atrophy with echogenic parenchyma, a nonspecific indicator of medical renal disease. Electronically Signed   By: Lucrezia Europe M.D.   On: 04/22/2022 16:03    Scheduled Meds:  aspirin EC  81 mg Oral Daily   carvedilol  12.5 mg Oral BID WC    Chlorhexidine Gluconate Cloth  6 each Topical Daily   enoxaparin (LOVENOX) injection  30 mg Subcutaneous Q24H   feeding supplement  1 Container Oral TID BM   insulin aspart  0-9 Units Subcutaneous TID WC   isosorbide mononitrate  30 mg Oral Daily   levothyroxine  100 mcg Oral QHS   metolazone  5 mg Oral Q24H   multivitamin with minerals  1 tablet Oral Daily   pantoprazole  40 mg Oral QAC breakfast   rosuvastatin  10 mg Oral QHS   sertraline  25 mg Oral Daily   sodium chloride flush  10-40 mL Intracatheter Q12H   sodium chloride flush  3 mL Intravenous Q12H   Continuous Infusions:  furosemide      LOS: 2 days  Raiford Noble, DO [facility] Available via Epic secure chat 7am-7pm After these hours, please refer to coverage provider listed on [email or web address] 04/24/2022, 12:14 PM

## 2022-04-24 NOTE — Consult Note (Signed)
Consultation Note Date: 04/24/2022   Patient Name: [name]  DOB: 1941-03-05  MRN: [id number]  Age / Sex: 81 y.o., female  PCP: Leeroy Cha, MD Referring Physician: Kerney Elbe, DO  Reason for Consultation: Establishing goals of care  HPI/Patient Profile: 81 y.o. female  admitted on 04/21/2022 [name] is a 81 y.o. female with medical history significant of CAD status post CABG, GERD, paroxysmal A-fib, sick sinus syndrome status post pacemaker, anemia, diabetes, neuropathy, hyperlipidemia, hypertension, hypothyroidism, CKD 4, diastolic CHF, depression, GI bleed presenting with elevated BUN from SNF. Labs here indicated [name] and she appeared volume overloaded. Started on IV lasix and admitted with nephrology consultation.     Nephrology evaluated and recommending increasing Lasix to IV 80 mg 3 times daily and following closely.  They recommended getting a UA, urine lites and renal ultrasound and placing a Foley catheter.  Currently she is not a candidate for hemodialysis and family was made aware by the nephrology team.  Renal function has not really improving with current treatment yesterday stable.  She only had 750 mL of total urine output yesterday.  Nephrology is planning to escalate her IV doses of Lasix to 100 mg 3 times daily and adding metolazone 5 mg daily.  Palliative care has been consulted for further goals of care assistance given that we are concerned that patient's renal function is declining towards ESRD and that she is not a HD candidate. Clinical Assessment and Goals of Care:  A palliative consultation has been requested for ongoing goals of care discussions. Consultation request received, chart reviewed, patient seen, noted to attempt to participate with PT earlier today. Call placed and I was able to reach [name] daughter [name].   Palliative medicine is specialized  medical care for people living with serious illness. It focuses on providing relief from the symptoms and stress of a serious illness. The goal is to improve quality of life for both the patient and the family. Goals of care: Broad aims of medical therapy in relation to the patient's values and preferences. Our aim is to provide medical care aimed at enabling patients to achieve the goals that matter most to them, given the circumstances of their particular medical situation and their constraints.     HCPOA  Daughter [name] at [phone].   DISCUSSION/SUMMARY OF RECOMMENDATIONS    Agree with DNR.  Advance care planning documents noted on the chart: DNR form, MOST form and HCPOA documents.  Goals of care discussions undertaken briefly with HCPOA daughter [name] via phone at [phone]: discussed about patient's current hospitalization and concern for ongoing decline in renal function. Discussed about hospice philosophy of care. Patient recently went to [facility] in [place] for rehab for her hip. We talked about home with hospice versus SNF with hospice.  Continue goals of care discussions, will reach out to hospice liaisons to see if they can coordinate further discussions with HCPOA, thank you for the consult.   Code Status/Advance Care Planning: DNR  Symptom Management:     Palliative Prophylaxis:  Frequent Pain Assessment  Additional Recommendations (Limitations, Scope, Preferences): No Hemodialysis  Psycho-social/Spiritual:  Desire for further Chaplaincy support:yes Additional Recommendations: Caregiving  Support/Resources  Prognosis:  < 6 months  Discharge Planning: To Be Determined      Primary Diagnoses: Present on Admission:  GERD  S/P placement of cardiac pacemaker  Anemia of chronic disease  Hyperlipidemia associated with type 2 diabetes mellitus ([place])  Hypertension associated with diabetes ([place])  Hypothyroidism  SSS (sick sinus syndrome)  (HCC)  Paroxysmal atrial fibrillation (HCC) - not on systemic anticoagulation due to hx of GI bleeding  Coronary artery disease  Acute renal failure superimposed on stage 4 chronic kidney disease ([place])   I have reviewed the medical record, interviewed the patient and family, and examined the patient. The following aspects are pertinent.  Past Medical History:  Diagnosis Date   Acute medial meniscus tear of right knee 07/02/2014   Acute renal failure,admitted 01/03/12, after admission for diastolic chf 56/43/3295   Anemia    Anxiety    Arthritis    "in my hands; knees, back" (09/27/2018)   CAD (coronary artery disease)    a. 40-59% bilaterally 10/2015.   CAD S/P percutaneous coronary angioplasty 09/27/2018   S/P SVG-OM PCI with DES 09/27/18   Chronic diastolic CHF (congestive heart failure) ([place])    a. 05/2016 Echo: EF 60-65%, no rwma, Gr1 DD, Ao sclerosis w/o stenosis, triv MR;  b. 07/2016 TEE: EF 55-60%, no rwma, mild MR.   Chronic headaches    CKD (chronic kidney disease), stage III (HCC)    Coronary artery disease    a. 02/2007 Persantine MV: low risk;  b. 11/2011 CABG x 3 (LIMA->LAD, VG->OM, VG->RCA);  c. 05/2016 MV: EF >65%, no isch/infarct, horiz ST dep in I, II, V5-V6.   Depression    Diverticulosis    Esophageal stricture    GERD (gastroesophageal reflux disease)    Hemorrhoids    Hiatal hernia    Hyperkalemia    a. ARB stopped due to this.   Hyperlipidemia    Hypertension    Hypertensive heart disease    Hypothyroidism    Major depressive disorder with anxious distress 09/05/2019   Mild cognitive impairment    a. seen by neurology.   Mild vascular neurocognitive disorder 09/05/2019   Myocardial infarction ([place]) 12/07/2011   NSTEMI (non-ST elevated myocardial infarction) ([place]) 12/01/2011   PAF (paroxysmal atrial fibrillation) ([place])    a. post-op CABG.   Pain    RIGHT KNEE PAIN - TORN RIGHT MEDIAL MENISCUS   Paroxysmal atrial flutter ([place])    a. 07/2016 s/p TEE & DCCV;   b. 07/2016 Recurrent PAFlutter req initiation of amio & PPM in setting of tachy-brady;  c. CHA2DS2VASc = 7-->Xarelto 15 mg QD.   Pneumonia    "twice" (09/27/2018)   PONV (postoperative nausea and vomiting)    Presence of permanent cardiac pacemaker 08/12/2016   S/P CABG (coronary artery bypass graft), 12/04/11 12/07/2011   LIMA to LAD, SVG to OM, SVG to RCA   Sinus bradycardia    a. not on BB due to this.   Skin cancer    "face" (09/27/2018)   Tachy-brady syndrome ([place])    a. 07/2016 Jxnl brady following DCCV, recurrent Aflutter-->amio + SJM 2272 Assurity MRI DC PPM (ser # [id number]).   Type II diabetes mellitus ([place])    Social History   Socioeconomic History   Marital status: Widowed  Spouse name: Not on file   Number of children: 2   Years of education: 12   Highest education level: High school graduate  Occupational History   Occupation: retired  Tobacco Use   Smoking status: Never   Smokeless tobacco: Never  Vaping Use   Vaping Use: Never used  Substance and Sexual Activity   Alcohol use: Not Currently    Comment: very occasional    Drug use: Never   Sexual activity: Not Currently    Birth control/protection: Surgical    Comment: Hysterectomy  Other Topics Concern   Not on file  Social History Narrative   2 children, 1 daughter and 1 adopted son   Social Determinants of Radio broadcast assistant Strain: Not on file  Food Insecurity: Not on file  Transportation Needs: Not on file  Physical Activity: Not on file  Stress: Not on file  Social Connections: Not on file   Family History  Problem Relation Age of Onset   Diabetes Mother    CVA Mother    Hypertension Mother    Heart disease Father    Hyperlipidemia Father    Breast cancer Sister    Breast cancer Sister        x 3   Heart disease Brother        x5; one with MI   Heart disease Sister        x3   Diabetes Sister        x3   Lung cancer Sister    Breast cancer Sister    Colon cancer Neg Hx     Scheduled Meds:  aspirin EC  81 mg Oral Daily   carvedilol  12.5 mg Oral BID WC   Chlorhexidine Gluconate Cloth  6 each Topical Daily   enoxaparin (LOVENOX) injection  30 mg Subcutaneous Q24H   feeding supplement  1 Container Oral TID BM   insulin aspart  0-9 Units Subcutaneous TID WC   isosorbide mononitrate  30 mg Oral Daily   levothyroxine  100 mcg Oral QHS   metolazone  5 mg Oral Q24H   multivitamin with minerals  1 tablet Oral Daily   pantoprazole  40 mg Oral QAC breakfast   rosuvastatin  10 mg Oral QHS   sertraline  25 mg Oral Daily   sodium chloride flush  10-40 mL Intracatheter Q12H   sodium chloride flush  3 mL Intravenous Q12H   Continuous Infusions:  furosemide     PRN Meds:.acetaminophen **OR** acetaminophen, albuterol, polyethylene glycol, polyvinyl alcohol, sodium chloride, sodium chloride flush Medications Prior to Admission:  Prior to Admission medications   Medication Sig Start Date End Date Taking? Authorizing Provider  acetaminophen (TYLENOL) 325 MG tablet Take 975 mg by mouth every 8 (eight) hours as needed for moderate pain.   Yes [provider]  Allopurinol 200 MG TABS Take 200 mg by mouth daily.   Yes [provider]  alum & mag hydroxide-simeth (MYLANTA MAXIMUM STRENGTH) 400-400-40 MG/5ML suspension Take 30 mLs by mouth every 4 (four) hours as needed for indigestion.   Yes [provider]  amLODipine (NORVASC) 5 MG tablet Take 1 tablet (5 mg total) by mouth daily. 09/14/21 04/21/22 Yes Hilty, Nadean Corwin, MD  aspirin EC 81 MG tablet Take 1 tablet (81 mg total) by mouth daily. 01/12/22  Yes [place] Eric J, DO  azithromycin (ZITHROMAX) 500 MG tablet Take 500 mg by mouth daily. 04/19/22  Yes [provider]  bisacodyl (DULCOLAX)  10 MG suppository Place 10 mg rectally daily as needed for moderate constipation.   Yes [provider]  carvedilol (COREG) 25 MG tablet Take 1 tablet (25 mg total) by mouth 2 (two) times daily.  02/12/22 04/21/22 Yes Dana Allan I, MD  diclofenac Sodium (VOLTAREN) 1 % GEL Apply 2 g topically 2 (two) times daily as needed (apply to hands).   Yes [provider]  enoxaparin (LOVENOX) 30 MG/0.3ML injection Inject 0.3 mLs (30 mg total) into the skin daily. 03/19/22 04/30/22 Yes Georgeanna Harrison, MD  ferrous sulfate 325 (65 FE) MG tablet Take 1 tablet (325 mg total) by mouth 2 (two) times daily with a meal. Patient taking differently: Take 325 mg by mouth daily. 03/23/22  Yes [place] Eric J, DO  gabapentin (NEURONTIN) 300 MG capsule Take 1 capsule (300 mg total) by mouth at bedtime. 05/22/19  Yes Roney Jaffe, MD  Glucagon, rDNA, (GLUCAGON EMERGENCY) 1 MG KIT Inject 1 mg as directed once as needed (hypoglycemia).   Yes [provider]  guaiFENesin (MUCINEX) 600 MG 12 hr tablet Take 600 mg by mouth 2 (two) times daily.   Yes [provider]  insulin glargine (LANTUS) 100 UNIT/ML injection Inject 0.15 mLs (15 Units total) into the skin daily. Patient taking differently: Inject 10 Units into the skin daily. Hold for BS less than 150 03/23/22 06/21/22 Yes [place] Eric J, DO  ipratropium-albuterol (DUONEB) 0.5-2.5 (3) MG/3ML SOLN Take 3 mLs by nebulization every 6 (six) hours as needed (wheezing).   Yes [provider]  isosorbide mononitrate (IMDUR) 30 MG 24 hr tablet Take 1 tablet (30 mg total) by mouth daily. 03/01/22  Yes Hilty, Nadean Corwin, MD  Lactulose 20 GM/30ML SOLN Take 30 mLs by mouth daily as needed (constipation).   Yes [provider]  meclizine (ANTIVERT) 25 MG tablet Take 25 mg by mouth daily as needed for dizziness.   Yes [provider]  melatonin 3 MG TABS tablet Take 6 mg by mouth at bedtime.   Yes [provider]  nitroGLYCERIN (NITROSTAT) 0.4 MG SL tablet Place 1 tablet (0.4 mg total) under the tongue every 5 (five) minutes as needed for chest pain (max 3 doses). 02/11/20 04/21/22 Yes Hilty, Nadean Corwin, MD  NON FORMULARY  Take 1 Dose by mouth daily as needed (suppliment).   Yes [provider]  ondansetron (ZOFRAN) 4 MG tablet Take 4 mg by mouth every 4 (four) hours as needed for nausea or vomiting.   Yes [provider]  OxyCODONE HCl, Abuse Deter, (OXAYDO) 5 MG TABA Take 5 mg by mouth every 4 (four) hours as needed (pain).   Yes [provider]  pantoprazole (PROTONIX) 40 MG tablet Take 40 mg by mouth at bedtime. 06/24/20  Yes [provider]  polyethylene glycol (MIRALAX / GLYCOLAX) 17 g packet Take 17 g by mouth daily. Patient taking differently: Take 17 g by mouth daily as needed for mild constipation. 02/13/22  Yes Dana Allan I, MD  rosuvastatin (CRESTOR) 10 MG tablet Take 10 mg by mouth at bedtime. 01/28/21  Yes [provider]  senna (SENOKOT) 8.6 MG TABS tablet Take 2 tablets by mouth at bedtime.   Yes [provider]  sodium chloride 0.45 % solution Inject 65 mL/hr into the vein continuous. 2 L total   Yes [provider]  SYNTHROID 100 MCG tablet Take 100 mcg by mouth at bedtime. 03/26/21  Yes [provider]  HYDROcodone-acetaminophen (NORCO/VICODIN) 5-325 MG tablet Take  1-2 tablets by mouth every 4 (four) hours as needed for moderate pain (pain score 4-6). Patient not taking: Reported on 04/21/2022 03/19/22   Georgeanna Harrison, MD   Allergies  Allergen Reactions   Clonidine Derivatives Other (See Comments)    Bradycardia and fatigue    Crestor [Rosuvastatin] Other (See Comments)    Made the patient feel tired/weak   Losartan Potassium Other (See Comments)    Hyperkalemia   Sulfa Antibiotics Other (See Comments)    Childhood reaction not recalled   Epinephrine Other (See Comments)    Abnormal feeling. Dental exam/injection of local w/ epi.   Hydralazine Other (See Comments)    Nausea/gi upset    Hydralazine Hcl Anxiety and Other (See Comments)    Nervousness, anxiousness, GI upset   Review of Systems Denies pain.    Physical Exam Elderly lady resting in bed No distress Unlabored breathing  Vital Signs: BP (!) 128/108 (BP Location: Right Arm)   Pulse 65   Temp 98.1 F (36.7 C) (Oral)   Resp 16   Ht 5' (1.524 m)   Wt 60.6 kg   SpO2 97%   BMI 26.09 kg/m  Pain Scale: 0-10   Pain Score: 0-No pain   SpO2: SpO2: 97 % O2 Device:SpO2: 97 % O2 Flow Rate: .   IO: Intake/output summary:  Intake/Output Summary (Last 24 hours) at 04/24/2022 1319 Last data filed at 04/24/2022 0258 Gross per 24 hour  Intake 240 ml  Output 775 ml  Net -535 ml    LBM: Last BM Date : 05/23/22 Baseline Weight: Weight: 58 kg Most recent weight: Weight: 60.6 kg     Palliative Assessment/Data:   PPS 40%  Time In:  12.10 Time Out:  13.10 Time Total:  60  Greater than 50%  of this time was spent counseling and coordinating care related to the above assessment and plan.  Signed by: Loistine Chance, MD   Please contact Palliative Medicine Team phone at [phone] for questions and concerns.  For individual provider: See Shea Evans

## 2022-04-24 NOTE — Progress Notes (Addendum)
[facility] Progress Note  Subjective: creat stable, UOP 750 cc yest. Not great.   Vitals:   04/23/22 0523 04/23/22 1200 04/23/22 2015 04/24/22 0541  BP: (!) 132/40 (!) 139/47 (!) 124/47 (!) 118/48  Pulse: (!) 59 63 69 63  Resp: '16 18 16 16  ' Temp: 98.1 F (36.7 C) 97.7 F (36.5 C) 98.6 F (37 C) 98 F (36.7 C)  TempSrc: Oral Oral Oral Oral  SpO2: 96% 96% 98% 98%  Weight:      Height:        Exam: Gen pleasant, elderly, responsive Sclera anicteric, throat clear  No jvd or bruits Chest clear bilat to bases RRR no MRG Abd soft ntnd no mass or ascites +bs Ext 2+ diffuse pitting edema bilat LE's Neuro is oriented to name only       Home meds include - amlodipine 5, aspirin, carvedilol 25 bid, enoxaparin, gabapentin, insulin glargine, isosorbide mononitrate, pantoprazole, rosuvastatin, synthroid, hydrocodone-aceta prn     BUN 105  creat 4.53  Ca 7.8 alb 2.9  Na 132  K 4.7     WBC 8  Hb 7.7 plt 160    CXR 6/28 - IMPRESSION: Opacity within the medial right lung base suspicious for pneumonia. Cardiomegaly.     UA mod Hb/ LE, 100 prot, many bact, 6-10 rbc, >50 wbc     BP's here - 100s- 120s/ 40- 70, HR 62  RR 18 afeb      B/l creat last 3 months in 2023 is 2.4- 2.8, eGFR 16- 19 ml/min   Assessment/ Plan: [name] on CKD 4 -  b/l creat from last 3 months in 2023 is 2.4- 2.8, eGFR 16- 19 ml/min. Creat here is 4.4, eGFR 9 ml/min, in setting of SOB, vol overload/ LE edema, low-normal BP's and gen'd weakness. Pt has dementia/ cognitive issues and is not a candidate for HD per her primary renal MD and family is aware. UA showed pyuria and renal US showed no obstruction. She is vol overloaded but not responding well to IV lasix yet. May be R- sided edema, not sure. Trying escalating doses of IV lasix, creat stable. Plan ^ lasix to 120 tid and add metolazone 5 qd. Will follow.  EOL - pts renal function likely is declining towards esrd. Pt is not a HD candidate. Family will be back  in town today. Appreciate palliative care assistance.  Dementia DNR HTN /vol - vol as above. Have lowered coreg to 12.5 bid for now, holding norvasc. BP's are good.  CADsp CABG PAF  SP PPM      Rob Damarien Nyman 04/24/2022, 10:12 AM   Recent Labs  Lab 04/22/22 0029 04/23/22 0423 04/24/22 0450  HGB 7.7* 7.5* 7.5*  ALBUMIN 2.9*  --  2.9*  CALCIUM 7.8* 8.0* 8.4*  PHOS  --   --  8.1*  CREATININE 4.53* 4.60* 4.64*  K 4.7 4.5 4.4    No results for input(s): "IRON", "TIBC", "FERRITIN" in the last 168 hours. Inpatient medications:  aspirin EC  81 mg Oral Daily   carvedilol  12.5 mg Oral BID WC   Chlorhexidine Gluconate Cloth  6 each Topical Daily   enoxaparin (LOVENOX) injection  30 mg Subcutaneous Q24H   feeding supplement  1 Container Oral TID BM   insulin aspart  0-9 Units Subcutaneous TID WC   isosorbide mononitrate  30 mg Oral Daily   levothyroxine  100 mcg Oral QHS   multivitamin with minerals  1 tablet Oral Daily  pantoprazole  40 mg Oral QAC breakfast   rosuvastatin  10 mg Oral QHS   sertraline  25 mg Oral Daily   sodium chloride flush  10-40 mL Intracatheter Q12H   sodium chloride flush  3 mL Intravenous Q12H    furosemide Stopped (04/24/22 0700)   acetaminophen **OR** acetaminophen, albuterol, polyethylene glycol, polyvinyl alcohol, sodium chloride, sodium chloride flush

## 2022-04-25 ENCOUNTER — Inpatient Hospital Stay (HOSPITAL_COMMUNITY): Payer: Medicare Other

## 2022-04-25 DIAGNOSIS — D638 Anemia in other chronic diseases classified elsewhere: Secondary | ICD-10-CM | POA: Diagnosis not present

## 2022-04-25 DIAGNOSIS — R040 Epistaxis: Secondary | ICD-10-CM

## 2022-04-25 DIAGNOSIS — Z515 Encounter for palliative care: Secondary | ICD-10-CM | POA: Diagnosis not present

## 2022-04-25 DIAGNOSIS — K2100 Gastro-esophageal reflux disease with esophagitis, without bleeding: Secondary | ICD-10-CM | POA: Diagnosis not present

## 2022-04-25 DIAGNOSIS — Z7189 Other specified counseling: Secondary | ICD-10-CM | POA: Diagnosis not present

## 2022-04-25 DIAGNOSIS — N179 Acute kidney failure, unspecified: Secondary | ICD-10-CM | POA: Diagnosis not present

## 2022-04-25 DIAGNOSIS — I5033 Acute on chronic diastolic (congestive) heart failure: Secondary | ICD-10-CM | POA: Diagnosis not present

## 2022-04-25 LAB — CBC WITH DIFFERENTIAL/PLATELET
Abs Immature Granulocytes: 0.05 10*3/uL (ref 0.00–0.07)
Basophils Absolute: 0 10*3/uL (ref 0.0–0.1)
Basophils Relative: 0 %
Eosinophils Absolute: 0.1 10*3/uL (ref 0.0–0.5)
Eosinophils Relative: 2 %
HCT: 23.2 % — ABNORMAL LOW (ref 36.0–46.0)
Hemoglobin: 7.6 g/dL — ABNORMAL LOW (ref 12.0–15.0)
Immature Granulocytes: 1 %
Lymphocytes Relative: 11 %
Lymphs Abs: 0.6 10*3/uL — ABNORMAL LOW (ref 0.7–4.0)
MCH: 32.5 pg (ref 26.0–34.0)
MCHC: 32.8 g/dL (ref 30.0–36.0)
MCV: 99.1 fL (ref 80.0–100.0)
Monocytes Absolute: 0.5 10*3/uL (ref 0.1–1.0)
Monocytes Relative: 9 %
Neutro Abs: 4.4 10*3/uL (ref 1.7–7.7)
Neutrophils Relative %: 77 %
Platelets: 198 10*3/uL (ref 150–400)
RBC: 2.34 MIL/uL — ABNORMAL LOW (ref 3.87–5.11)
RDW: 17.1 % — ABNORMAL HIGH (ref 11.5–15.5)
WBC: 5.7 10*3/uL (ref 4.0–10.5)
nRBC: 0 % (ref 0.0–0.2)

## 2022-04-25 LAB — MAGNESIUM: Magnesium: 2.2 mg/dL (ref 1.7–2.4)

## 2022-04-25 LAB — COMPREHENSIVE METABOLIC PANEL
ALT: 23 U/L (ref 0–44)
AST: 28 U/L (ref 15–41)
Albumin: 2.9 g/dL — ABNORMAL LOW (ref 3.5–5.0)
Alkaline Phosphatase: 195 U/L — ABNORMAL HIGH (ref 38–126)
Anion gap: 15 (ref 5–15)
BUN: 119 mg/dL — ABNORMAL HIGH (ref 8–23)
CO2: 20 mmol/L — ABNORMAL LOW (ref 22–32)
Calcium: 8.3 mg/dL — ABNORMAL LOW (ref 8.9–10.3)
Chloride: 99 mmol/L (ref 98–111)
Creatinine, Ser: 4.32 mg/dL — ABNORMAL HIGH (ref 0.44–1.00)
GFR, Estimated: 10 mL/min — ABNORMAL LOW (ref >60)
Glucose, Bld: 157 mg/dL — ABNORMAL HIGH (ref 70–99)
Potassium: 3.8 mmol/L (ref 3.5–5.1)
Sodium: 134 mmol/L — ABNORMAL LOW (ref 135–145)
Total Bilirubin: 0.8 mg/dL (ref 0.3–1.2)
Total Protein: 6 g/dL — ABNORMAL LOW (ref 6.5–8.1)

## 2022-04-25 LAB — GLUCOSE, CAPILLARY
Glucose-Capillary: 144 mg/dL — ABNORMAL HIGH (ref 70–99)
Glucose-Capillary: 189 mg/dL — ABNORMAL HIGH (ref 70–99)
Glucose-Capillary: 190 mg/dL — ABNORMAL HIGH (ref 70–99)
Glucose-Capillary: 158 mg/dL — ABNORMAL HIGH (ref 70–99)

## 2022-04-25 LAB — PHOSPHORUS: Phosphorus: 7 mg/dL — ABNORMAL HIGH (ref 2.5–4.6)

## 2022-04-25 MED ORDER — DEXTROSE 5 % IV SOLN
160.0000 mg | Freq: Three times a day (TID) | INTRAVENOUS | Status: DC
  Administered 2022-04-25 – 2022-04-26 (×3): 160 mg via INTRAVENOUS
  Filled 2022-04-25: qty 16
  Filled 2022-04-25: qty 14
  Filled 2022-04-25 (×2): qty 16

## 2022-04-25 MED ORDER — OXYMETAZOLINE HCL 0.05 % NA SOLN
2.0000 | Freq: Once | NASAL | Status: AC
  Administered 2022-04-25: 2 via NASAL
  Filled 2022-04-25 (×2): qty 15

## 2022-04-25 MED ORDER — OXYMETAZOLINE HCL 0.05 % NA SOLN
1.0000 | Freq: Two times a day (BID) | NASAL | Status: DC
  Administered 2022-04-25: 1 via NASAL
  Filled 2022-04-25: qty 15

## 2022-04-25 NOTE — Progress Notes (Signed)
[facility] Progress Note  Subjective: creat stable, UOP 750 cc yest. Not great.   Vitals:   04/25/22 0052 04/25/22 0451 04/25/22 0500 04/25/22 1427  BP: (!) 139/50 (!) 145/49  138/63  Pulse: 64 63  61  Resp: '20 20  16  ' Temp: 98.1 F (36.7 C) 98.4 F (36.9 C)  97.9 F (36.6 C)  TempSrc: Oral Oral  Oral  SpO2: 96% 97%  99%  Weight:   63.6 kg   Height:        Exam: Gen pleasant, elderly, responsive Sclera anicteric, throat clear  No jvd or bruits Chest clear bilat to bases RRR no MRG Abd soft ntnd no mass or ascites +bs Ext 2+ diffuse pitting edema bilat LE's Neuro is oriented to name only       Home meds include - amlodipine 5, aspirin, carvedilol 25 bid, enoxaparin, gabapentin, insulin glargine, isosorbide mononitrate, pantoprazole, rosuvastatin, synthroid, hydrocodone-aceta prn     BUN 105  creat 4.53  Ca 7.8 alb 2.9  Na 132  K 4.7     WBC 8  Hb 7.7 plt 160    CXR 6/28 - IMPRESSION: Opacity within the medial right lung base suspicious for pneumonia. Cardiomegaly.     UA mod Hb/ LE, 100 prot, many bact, 6-10 rbc, >50 wbc     BP's here - 100s- 120s/ 40- 70, HR 62  RR 18 afeb      B/l creat last 3 months in 2023 is 2.4- 2.8, eGFR 16- 19 ml/min   Assessment/ Plan: [name] on CKD 4 -  b/l creat from last 3 months in 2023 is 2.4- 2.8, eGFR 16- 19 ml/min. Creat here is 4.4, eGFR 9 ml/min, in setting of SOB, vol overload/ LE edema, low-normal BP's and gen'd weakness. Pt has dementia/ cognitive issues and is not a candidate for HD per her primary renal MD and family is aware. UA showed pyuria and renal US showed no obstruction. She is vol overloaded but not responding well to IV lasix yet. May be R- sided edema, not sure. Trying escalating doses of IV lasix, creat stable so far. Will ^ lasix IV close to max at 160 tid w/ po metolazone at 56m qd. UOP has improved and edema is better per the dtr. Would continue to try to get a successful diuresis for her. Have d/w dtr at  bedside.  EOL - pts renal function may be declining towards esrd. Pt is not a HD candidate. Family will be back in town today. Appreciate palliative care assistance.  Dementia DNR HTN /vol - vol as above. Have lowered coreg to 12.5 bid for now, holding norvasc. BP's are good.  CADsp CABG PAF  SP PPM      Rob Adamari Frede 04/25/2022, 6:12 PM   Recent Labs  Lab 04/24/22 0450 04/25/22 0830  HGB 7.5* 7.6*  ALBUMIN 2.9* 2.9*  CALCIUM 8.4* 8.3*  PHOS 8.1* 7.0*  CREATININE 4.64* 4.32*  K 4.4 3.8    No results for input(s): "IRON", "TIBC", "FERRITIN" in the last 168 hours. Inpatient medications:  aspirin EC  81 mg Oral Daily   carvedilol  12.5 mg Oral BID WC   Chlorhexidine Gluconate Cloth  6 each Topical Daily   enoxaparin (LOVENOX) injection  30 mg Subcutaneous Q24H   feeding supplement  1 Container Oral TID BM   insulin aspart  0-9 Units Subcutaneous TID WC   isosorbide mononitrate  30 mg Oral Daily   levothyroxine  100 mcg Oral  QHS   metolazone  5 mg Oral Q24H   multivitamin with minerals  1 tablet Oral Daily   pantoprazole  40 mg Oral QAC breakfast   rosuvastatin  10 mg Oral QHS   sertraline  25 mg Oral Daily   sodium chloride flush  10-40 mL Intracatheter Q12H   sodium chloride flush  3 mL Intravenous Q12H     acetaminophen **OR** acetaminophen, albuterol, polyethylene glycol, polyvinyl alcohol, sodium chloride, sodium chloride flush

## 2022-04-25 NOTE — Progress Notes (Signed)
HOSPITAL MEDICINE OVERNIGHT EVENT NOTE    Notified by nursing that patient had a nosebleed at approximately midnight.  At that time, Dr. Nevada Crane had ordered a one-time dose of Afrin which was successful in stopping the patient's nosebleed.  Unfortunately, the patient has begun to have yet another slow yet unrelenting nosebleed.  We will attempt to administer another dose of Afrin.  When this is successful in stopping the nosebleed, mupirocin to the nares twice daily can be ordered to prevent further bleeding.  [name] Emerald  MD [facility]

## 2022-04-25 NOTE — Progress Notes (Signed)
PROGRESS NOTE    NERA HAWORTH  NKN:[id number] DOB: [birth year] DOA: 04/21/2022 PCP: Leeroy Cha, MD   Brief Narrative:  WANETTA FUNDERBURKE is a 81 y.o. female with medical history significant of CAD status post CABG, GERD, paroxysmal A-fib, sick sinus syndrome status post pacemaker, anemia, diabetes, neuropathy, hyperlipidemia, hypertension, hypothyroidism, CKD 4, diastolic CHF, depression, GI bleed presenting with elevated BUN from SNF. Labs here indicated [name] and she appeared volume overloaded. Started on IV lasix and admitted with nephrology consultation.  **Interim History   Nephrology evaluated and recommending increasing Lasix to IV 80 mg 3 times daily and following closely.  They recommended getting a UA, urine lites and renal ultrasound and placing a Foley catheter.  Currently she is not a candidate for hemodialysis and family was made aware by the nephrology team.  Renal function has not really improving with current treatment yesterday stable.  She only had 750 mL of total urine output yesterday.  Nephrology is planning to escalate her IV doses of Lasix to 100 mg 3 times daily and adding metolazone 5 mg daily.  Palliative care has been consulted for further goals of care assistance given that we are concerned that patient's renal function is declining towards ESRD and that she is not a HD candidate.  Patient's creatinine is slightly improved but her BUN is worse today.  Palliative involved in discussing hospice options with the patient daughter with another home with home hospice versus back to facility with hospice.    Assessment and Plan: No notes have been filed under this hospital service. Service: Hospitalist  [name] on CKD 4 and acute on chronic diastolic heart failure Metabolic Acidosis Hyponatremia in the setting of hypervolemia -SCr 4.43 from baseline of around 2.6. BNP >1200 w/LE edema in setting of decreased diuretic dosing at SNF.  - Insufficient diuresis with  lasix '40mg'$  IV, augment per nephrology to '80mg'$  IV TID but now further increased to 120 mg TID and adding Metolazone 5 mg po Daily .  - Check U/S, UA micro, lytes and these are pending.  Renal ultrasound done and showed "No hydronephrosis. Bilateral renal parenchymal atrophy with echogenic parenchyma, a nonspecific indicator of medical renal disease." -Urinalysis done and showed turbid appearance with moderate hemoglobin, negative ketones, moderate leukocytes, 100 protein's, many bacteria, present mucus, 6-10 RBCs per high-power field, greater than 50 WBCs - Pt said not to be candidate for HD, family aware.  - Strict I/O, daily weights. She is -1.125 Liters since admission  - Renal diet with fluid restriction -Patient's BUNs/creatinine went from 105/4.53 -> 109/4.60 -> 98/4.64 and is now 119/4.32 -She has a mild metabolic acidosis with a CO2 of 20, anion gap of 15, chloride level of 99; sodium went from 132 -> 130 -> 133 and is now 134 -Repeat CXR this AM showed "Regressed but not resolved right lung base opacity seen last month. Suspicion of  underlying small pleural effusions, such that this may be atelectasis. Stable cardiomegaly. No new cardiopulmonary abnormality." -Palliative care consulted for further goals of care discussion as below   Hyperlipidemia CAD status post CABG and stenting - Continue home Rosuvastatin - Continue home Carvedilol - Continue home Imdur - Continue home Aspirin  GERD/GI Prophylaxis  - Continue PPI with Pantoprazole 40 mg po qHS  Paroxysmal A-fib, SSS s/p PPM:  -Paced rhythm noted.  Anemia of CKD:  -Patient's hemoglobin/hematocrit went from 7.7/23.7 -> 7.5/23.3 -> 7.5/23.0 and is now 7.6/23.2 -Check Anemia Panel in a.m. -Continue monitor for signs symptoms  bleeding; no overt bleeding noted -Repeat CBC in the a.m.  Epistaxis -She had a nosebleed approximately midnight.  She had a one-time dose of Afrin which was successful in stopping the patient's nosebleed  however she had another 1 and she is given another dose of aspirin and she was added mupirocin to the nares twice daily to prevent further bleeding and added a [place] nose nasal spray -Continue monitor for further bleeding  T2DM with diabetic neuropathy:  - SSI -CBGs ranging from 143-189 - Hold gabapentin with CrCl < 87m/min.   Hypertension - Holding home amlodipine in the setting of low normal blood pressure, decrease coreg. -Continue to monitor blood pressures per protocol  Hypothyroid - Continue home Levothyroxine  Depression, Dementia - Continue home sertraline, delirium precautions   [place] Discussion and discussing hospice options with the patient and daughter DVT prophylaxis: enoxaparin (LOVENOX) injection 30 mg Start: 04/21/22 2200    Code Status: DNR Family Communication: Discussed with the patient's daughter [name] at bedside  Disposition Plan:  Level of care: Telemetry Status is: Inpatient Remains inpatient appropriate because: Needs further work-up and evaluation by nephrology and likely going back to SNF with hospice   Consultants:  Nephrology Palliative care medicine  Procedures:  None  Antimicrobials:  Anti-infectives (From admission, onward)    None       Subjective: Seen and examined and she is asleep and woke up from her sleep.  States that she is doing okay.  No nausea or vomiting.  Had some nosebleeds overnight but this is now stopped.  No other concerns or complaints at this time and creatinine is slightly improved but BUN has worsened.  Objective: Vitals:   04/25/22 0052 04/25/22 0451 04/25/22 0500 04/25/22 1427  BP: (!) 139/50 (!) 145/49  138/63  Pulse: 64 63  61  Resp: '20 20  16  '$ Temp: 98.1 F (36.7 C) 98.4 F (36.9 C)  97.9 F (36.6 C)  TempSrc: Oral Oral  Oral  SpO2: 96% 97%  99%  Weight:   63.6 kg   Height:        Intake/Output Summary (Last 24 hours) at 04/25/2022 1637 Last  data filed at 04/25/2022 1300 Gross per 24 hour  Intake 592.5 ml  Output 1340 ml  Net -747.5 ml   Filed Weights   04/21/22 1637 04/23/22 0500 04/25/22 0500  Weight: 58 kg 60.6 kg 63.6 kg   Examination: Physical Exam:  Constitutional: WN/WD overweight Caucasian female in no acute distress was sleeping and woke her from her sleep Respiratory: Diminished to auscultation bilaterally with coarse breath sounds, no wheezing, rales, rhonchi or crackles. Normal respiratory effort and patient is not tachypenic. No accessory muscle use.  Unlabored breathing Cardiovascular: RRR, no murmurs / rubs / gallops. S1 and S2 auscultated.  Mild 1+ lower extremity edema Abdomen: Soft, non-tender, distended second by habitus. Bowel sounds positive.  GU: Deferred. Musculoskeletal: No clubbing / cyanosis of digits/nails. No joint deformity upper and lower extremities Skin: No rashes, lesions, ulcers on limited skin evaluation. No induration; Warm and dry.  Neurologic: CN 2-12 grossly intact with no focal deficits. Romberg sign and cerebellar reflexes not assessed.  Psychiatric: Normal judgment and insight. Alert and oriented x 3. Normal mood and appropriate affect.   Data Reviewed: I have personally reviewed following labs and imaging studies  CBC: Recent Labs  Lab 04/21/22 1847 04/22/22 0029 04/23/22 0423 04/24/22 0450 04/25/22 0830  WBC 8.9 8.8 8.2 6.1 5.7  NEUTROABS 7.3  --   --  4.8 4.4  HGB 7.5* 7.7* 7.5* 7.5* 7.6*  HCT 23.0* 23.7* 23.3* 23.0* 23.2*  MCV 100.4* 100.9* 100.9* 100.4* 99.1  PLT 153 160 187 178 128   Basic Metabolic Panel: Recent Labs  Lab 04/21/22 1641 04/22/22 0029 04/23/22 0423 04/24/22 0450 04/25/22 0830  NA 131* 132* 130* 133* 134*  K 4.7 4.7 4.5 4.4 3.8  CL 99 98 97* 101 99  CO2 20* 20* 20* 19* 20*  GLUCOSE 226* 165* 131* 142* 157*  BUN 100* 105* 109* 98* 119*  CREATININE 4.43* 4.53* 4.60* 4.64* 4.32*  CALCIUM 7.8* 7.8* 8.0* 8.4* 8.3*  MG  --  2.1  --  2.3 2.2   PHOS  --   --   --  8.1* 7.0*   GFR: Estimated Creatinine Clearance: 8.4 mL/min (A) (by C-G formula based on SCr of 4.32 mg/dL (H)). Liver Function Tests: Recent Labs  Lab 04/21/22 1641 04/22/22 0029 04/24/22 0450 04/25/22 0830  AST 38 42* 32 28  ALT '25 26 23 23  '$ ALKPHOS 217* 219* 216* 195*  BILITOT 0.7 0.7 0.9 0.8  PROT 5.9* 5.9* 5.8* 6.0*  ALBUMIN 2.9* 2.9* 2.9* 2.9*   No results for input(s): "LIPASE", "AMYLASE" in the last 168 hours. No results for input(s): "AMMONIA" in the last 168 hours. Coagulation Profile: No results for input(s): "INR", "PROTIME" in the last 168 hours. Cardiac Enzymes: No results for input(s): "CKTOTAL", "CKMB", "CKMBINDEX", "TROPONINI" in the last 168 hours. BNP (last 3 results) No results for input(s): "PROBNP" in the last 8760 hours. HbA1C: No results for input(s): "HGBA1C" in the last 72 hours. CBG: Recent Labs  Lab 04/24/22 1647 04/24/22 1700 04/24/22 2051 04/25/22 0744 04/25/22 1125  GLUCAP 163* 171* 143* 144* 189*   Lipid Profile: No results for input(s): "CHOL", "HDL", "LDLCALC", "TRIG", "CHOLHDL", "LDLDIRECT" in the last 72 hours. Thyroid Function Tests: No results for input(s): "TSH", "T4TOTAL", "FREET4", "T3FREE", "THYROIDAB" in the last 72 hours. Anemia Panel: No results for input(s): "VITAMINB12", "FOLATE", "FERRITIN", "TIBC", "IRON", "RETICCTPCT" in the last 72 hours. Sepsis Labs: No results for input(s): "PROCALCITON", "LATICACIDVEN" in the last 168 hours.  Recent Results (from the past 240 hour(s))  MRSA Next Gen by PCR, Nasal     Status: None   Collection Time: 04/22/22  2:02 PM   Specimen: Nasal Mucosa; Nasal Swab  Result Value Ref Range Status   MRSA by PCR Next Gen NOT DETECTED NOT DETECTED Final    Comment: (NOTE) The GeneXpert MRSA Assay (FDA approved for NASAL specimens only), is one component of a comprehensive MRSA colonization surveillance program. It is not intended to diagnose MRSA infection nor to  guide or monitor treatment for MRSA infections. Test performance is not FDA approved in patients less than 76 years old. Performed at [facility], [place] [address], [place], [place] [address]     Radiology Studies: DG CHEST PORT 1 VIEW  Result Date: 04/25/2022 CLINICAL DATA:  81 year old female with renal failure. Abnormal right lung base opacity last month. EXAM: PORTABLE CHEST 1 VIEW COMPARISON:  Portable chest 04/21/2022 and earlier. FINDINGS: Portable AP semi upright view at 0514 hours. Stable lung volumes with underlying chronic cardiomegaly, prior CABG, left chest cardiac pacemaker. Streaky right infrahilar opacity has regressed since last month but not completely resolved. No pneumothorax. Stable pulmonary vascularity without overt edema. Questionable additional veiling opacity at both lung bases, with small bilateral pleural effusions demonstrated by CT in April. Osteopenia. Chronic  proximal left humerus deformity. Paucity of bowel gas in the upper abdomen. IMPRESSION: 1. Regressed but not resolved right lung base opacity seen last month. Suspicion of underlying small pleural effusions, such that this may be atelectasis. 2. Stable cardiomegaly. No new cardiopulmonary abnormality. Electronically Signed   By: Genevie Ann M.D.   On: 04/25/2022 08:31    Scheduled Meds:  aspirin EC  81 mg Oral Daily   carvedilol  12.5 mg Oral BID WC   Chlorhexidine Gluconate Cloth  6 each Topical Daily   enoxaparin (LOVENOX) injection  30 mg Subcutaneous Q24H   feeding supplement  1 Container Oral TID BM   insulin aspart  0-9 Units Subcutaneous TID WC   isosorbide mononitrate  30 mg Oral Daily   levothyroxine  100 mcg Oral QHS   metolazone  5 mg Oral Q24H   multivitamin with minerals  1 tablet Oral Daily   pantoprazole  40 mg Oral QAC breakfast   rosuvastatin  10 mg Oral QHS   sertraline  25 mg Oral Daily   sodium chloride flush  10-40 mL Intracatheter Q12H   sodium chloride flush  3 mL  Intravenous Q12H   Continuous Infusions:   LOS: 3 days   Raiford Noble, DO [facility] Available via Epic secure chat 7am-7pm After these hours, please refer to coverage provider listed on [email or web address] 04/25/2022, 4:37 PM

## 2022-04-25 NOTE — Progress Notes (Signed)
Daily Progress Note   Patient Name: [name]       Date: 04/25/2022 DOB: 03/27/1941  Age: 81 y.o. MRN#: [id number] Attending Physician: Kerney Elbe, DO Primary Care Physician: Leeroy Cha, MD Admit Date: 04/21/2022  Reason for Consultation/Follow-up: Establishing goals of care  Subjective: Resting in bed, daughter at bedside.   Length of Stay: 3  Current Medications: Scheduled Meds:   aspirin EC  81 mg Oral Daily   carvedilol  12.5 mg Oral BID WC   Chlorhexidine Gluconate Cloth  6 each Topical Daily   enoxaparin (LOVENOX) injection  30 mg Subcutaneous Q24H   feeding supplement  1 Container Oral TID BM   insulin aspart  0-9 Units Subcutaneous TID WC   isosorbide mononitrate  30 mg Oral Daily   levothyroxine  100 mcg Oral QHS   metolazone  5 mg Oral Q24H   multivitamin with minerals  1 tablet Oral Daily   pantoprazole  40 mg Oral QAC breakfast   rosuvastatin  10 mg Oral QHS   sertraline  25 mg Oral Daily   sodium chloride flush  10-40 mL Intracatheter Q12H   sodium chloride flush  3 mL Intravenous Q12H    Continuous Infusions:   PRN Meds: acetaminophen **OR** acetaminophen, albuterol, polyethylene glycol, polyvinyl alcohol, sodium chloride, sodium chloride flush  Physical Exam         Weak appearing lady Resting in bed Generalized weakness  Vital Signs: BP (!) 145/49 (BP Location: Right Arm)   Pulse 63   Temp 98.4 F (36.9 C) (Oral)   Resp 20   Ht 5' (1.524 m)   Wt 63.6 kg   SpO2 97%   BMI 27.38 kg/m  SpO2: SpO2: 97 % O2 Device: O2 Device: Room Air O2 Flow Rate:    Intake/output summary:  Intake/Output Summary (Last 24 hours) at 04/25/2022 1200 Last data filed at 04/25/2022 1009 Gross per 24 hour  Intake 232.5 ml  Output 1840 ml  Net  -1607.5 ml   LBM: Last BM Date : 04/23/22 Baseline Weight: Weight: 58 kg Most recent weight: Weight: 63.6 kg       Palliative Assessment/Data:      Patient Active Problem List   Diagnosis Date Noted   Acute on chronic diastolic CHF (congestive heart failure) ([place]) 04/21/2022  Hyponatremia 03/20/2022   Malnutrition of moderate degree 03/19/2022   Closed intertrochanteric fracture of left hip ([place]) 03/18/2022   Cardiac pacemaker in situ 03/18/2022   DNR (do not resuscitate)/DNI(Do Not Intubate) 03/18/2022   Acute renal failure superimposed on stage 4 chronic kidney disease ([place]) 01/03/2022   Mild vascular neurocognitive disorder 09/05/2019   Major depressive disorder with anxious distress 09/05/2019   Acute postoperative anemia due to expected blood loss 05/21/2019   Anemia of chronic disease 05/21/2019   Anemia in chronic kidney disease 05/21/2019   SSS (sick sinus syndrome) (HCC)    Paroxysmal atrial fibrillation (HCC) - not on systemic anticoagulation due to hx of GI bleeding    S/P placement of cardiac pacemaker    Status post coronary artery stent placement    Abnormal nuclear stress test    Pain of left shoulder joint on movement 06/16/2018   Spinal stenosis of lumbar region 02/28/2018   On anticoagulant therapy 04/22/2017   Tachycardia-bradycardia syndrome ([place]) 12/23/2016   Chronic diastolic CHF (congestive heart failure) ([place]) 08/09/2016   Paroxysmal atrial flutter ([place]) 08/08/2016   Coronary artery disease    Hypertensive heart disease    CKD (chronic kidney disease) stage 4, GFR 15-29 ml/min (HCC)    Hypothyroidism    Osteoarthritis of right knee 07/02/2014   Diabetic peripheral neuropathy associated with type 1 diabetes mellitus ([place]) 09/05/2013   Hypertension associated with diabetes ([place]) 01/06/2012   S/P CABG (coronary artery bypass graft), 12/04/11 12/07/2011   Hyperlipidemia associated with type 2 diabetes mellitus ([place]) 12/03/2011   Family history of  early CAD 12/01/2011   Insulin dependent type 2 diabetes mellitus ([place]) 12/14/2010   DYSPHAGIA UNSPECIFIED 12/14/2010   ESOPHAGEAL STRICTURE 11/28/2009   GERD 11/28/2009   FLATULENCE-GAS-BLOATING 11/28/2009   CONSTIPATION, CHRONIC 12/19/2007   MENOPAUSE, SURGICAL 12/19/2007   BACK PAIN, LUMBAR 12/19/2007   TOTAL KNEE REPLACEMENT, LEFT, HX OF 12/19/2007    Palliative Care Assessment & Plan   Patient Profile:    Assessment:  [name] is a 81 y.o. female with medical history significant of CAD status post CABG, GERD, paroxysmal A-fib, sick sinus syndrome status post pacemaker, anemia, diabetes, neuropathy, hyperlipidemia, hypertension, hypothyroidism, CKD 4, diastolic CHF, depression, GI bleed presenting with elevated BUN from SNF. Labs here indicated [name] and she appeared volume overloaded. Started on IV lasix and admitted with nephrology consultation.     Nephrology evaluated and recommending increasing Lasix to IV 80 mg 3 times daily and following closely.  They recommended getting a UA, urine lites and renal ultrasound and placing a Foley catheter.  Currently she is not a candidate for hemodialysis and family was made aware by the nephrology team.  Renal function has not really improving   Nephrology is planning to escalate her IV doses of Lasix to 100 mg 3 times daily and adding metolazone 5 mg daily.  Palliative care has been consulted for further goals of care assistance given that we are concerned that patient's renal function is declining towards ESRD and that she is not a HD candidate.  Recommendations/Plan:  Hospice consult, discussed with daughter at bedside. Home with hospice versus back to facility with hospice.     Code Status:    Code Status Orders  (From admission, onward)           Start     Ordered   04/21/22 2017  Do not attempt resuscitation (DNR)  Continuous       Question Answer Comment  In  the event of cardiac or respiratory ARREST Do not call a  "code blue"   In the event of cardiac or respiratory ARREST Do not perform Intubation, CPR, defibrillation or ACLS   In the event of cardiac or respiratory ARREST Use medication by any route, position, wound care, and other measures to relive pain and suffering. May use oxygen, suction and manual treatment of airway obstruction as needed for comfort.   Comments verified with pt and [name]      04/21/22 2022           Code Status History     Date Active Date Inactive Code Status Order ID Comments User Context   03/18/2022 2031 03/23/2022 1811 DNR [id number]  Kristopher Oppenheim, DO Inpatient   03/18/2022 1008 03/18/2022 2031 DNR [id number]  Kristopher Oppenheim, DO ED   03/18/2022 0440 03/18/2022 1008 DNR [id number]  Kristopher Oppenheim, DO ED   02/10/2022 1302 02/12/2022 2021 DNR [id number]  Norval Morton, MD ED   02/10/2022 1136 02/10/2022 1302 Full Code [id number]  Norval Morton, MD ED   01/03/2022 2301 01/09/2022 1734 DNR [id number]  Orene Desanctis, DO ED   08/10/2021 1941 08/11/2021 2042 DNR [id number]  Lenore Cordia, MD ED   10/03/2020 1747 10/05/2020 1941 Full Code [id number]  Norval Morton, MD ED   05/15/2019 0323 05/22/2019 1712 Full Code [id number]  Rise [name], MD ED   09/27/2018 1722 09/28/2018 1514 Full Code [id number]  Troy Sine, MD Inpatient   09/06/2018 1456 09/06/2018 2155 Full Code [id number]  Troy Sine, MD Inpatient   08/08/2016 2159 08/14/2016 1844 Full Code [id number]  Karmen Bongo, MD Inpatient   08/08/2016 2009 08/08/2016 2159 Full Code [id number]  Karmen Bongo, MD ED   05/23/2016 1611 05/25/2016 2009 Full Code [id number]  Rondel Jumbo, PA-C ED   01/03/2012 1649 01/07/2012 1439 Full Code [id number]  Margaretmary Dys, RN Inpatient   12/24/2011 1830 12/29/2011 2044 Full Code [id number]  Georgiana Shore, RN Inpatient   12/01/2011 1831 12/10/2011 1442 Full Code [id number]  Alda Berthold, RN Inpatient      Advance Directive Documentation    Flowsheet Row Most Recent Value  Type of Advance  Directive Healthcare Power of Attorney, Living will  Pre-existing out of facility DNR order (yellow form or pink MOST form) --  "MOST" Form in Place? --       Prognosis:  Guarded, 2-3 weeks?  Discharge Planning: Back to SNF with hospice versus home with hospice, TOC and [facility] consulted.   Care plan was discussed with [facility] MD, hospice liaison and also daughter.   Thank you for allowing the Palliative Medicine Team to assist in the care of this patient.    MOD MDM     Greater than 50%  of this time was spent counseling and coordinating care related to the above assessment and plan.  Loistine Chance, MD  Please contact Palliative Medicine Team phone at [phone] for questions and concerns.

## 2022-04-25 NOTE — Progress Notes (Signed)
Epistaxis was noted at 12 MN. On call Aileen Fass, MD notified. CN made aware. Not in distress. Afrin nasal spray was ordered and given.

## 2022-04-25 NOTE — Evaluation (Signed)
Occupational Therapy Evaluation Patient Details Name: [name] MRN: [id number] DOB: June 07, 1941 Today's Date: 04/25/2022   History of Present Illness 81 y.o. female with medical history significant of CAD status post CABG, GERD, paroxysmal A-fib, sick sinus syndrome status post pacemaker, anemia, diabetes, neuropathy, hyperlipidemia, hypertension, hypothyroidism, CKD 4, diastolic CHF, depression, GI bleed, s/p L IM nail 03/18/22 and presenting with elevated BUN from SNF.  Pt admitted 04/21/22 for [name] on CKD 4 and acute on chronic diastolic heart failure.  Per nephrology note: "EOL - pts renal function likely is declining towards esrd. Pt is not a HD candidate. Family will be back in town today. Appreciate palliative care assistance."   Clinical Impression   [name] is an 81 year old woman admitted with above medical history. At baseline she has been living at a facility and getting therapy s/p hip fracture repair. Part A therapy had stopped with plans for Med B therapy to begin with less frequency. She was still needing assistance for transfers and ADLs. Today patient is able to stand with min assist from recliner and mod assist to transfer back to bed with walker. She still needs significant assistance for ADLs. She is fearful of falling. Patient's daughter present during evaluation. POC is still somewhat unsure of discharge disposition but appears Hospice is the plan. Patient's daughter would like to maintain patient's ability to assist with transfers, ambulate a little and assist as much as possible and would like to continue therapy while in hospital. Patient will benefit from skilled OT services while in hospital to improve deficits and learn compensatory strategies as needed in order to reduce caregiver burden.       Recommendations for follow up therapy are one component of a multi-disciplinary discharge planning process, led by the attending physician.  Recommendations may be updated  based on patient status, additional functional criteria and insurance authorization.   Follow Up Recommendations  Long-term institutional care without follow-up therapy    Assistance Recommended at Discharge Frequent or constant Supervision/Assistance  Patient can return home with the following A lot of help with walking and/or transfers;A lot of help with bathing/dressing/bathroom;Assistance with cooking/housework;Direct supervision/assist for financial management;Direct supervision/assist for medications management;Help with stairs or ramp for entrance;Assist for transportation    Functional Status Assessment  Patient has had a recent decline in their functional status and/or demonstrates limited ability to make significant improvements in function in a reasonable and predictable amount of time  Equipment Recommendations  None recommended by OT    Recommendations for Other Services       Precautions / Restrictions Precautions Precautions: Fall Restrictions Weight Bearing Restrictions: No      Mobility Bed Mobility Overal bed mobility: Needs Assistance Bed Mobility: Sit to Supine       Sit to supine: Mod assist   General bed mobility comments: Mod assist for LEs to return to supine    Transfers Overall transfer level: Needs assistance Equipment used: Rolling walker (2 wheels) Transfers: Sit to/from Stand, Bed to chair/wheelchair/BSC Sit to Stand: +2 safety/equipment, Min assist Stand pivot transfers: Mod assist         General transfer comment: Min assist to stand from recliner but mod assist to take steps to bed (3-4) needing assistance for weight shifting, cueing to take steps and assistance for walker.      Balance Overall balance assessment: Needs assistance Sitting-balance support: No upper extremity supported, Feet supported Sitting balance-Leahy Scale: Fair     Standing balance support: [employer]  on assistive device for balance Standing balance-Leahy  Scale: Poor                             ADL either performed or assessed with clinical judgement   ADL Overall ADL's : Needs assistance/impaired Eating/Feeding: Set up;Sitting;Supervision/ safety   Grooming: Set up;Sitting;Supervision/safety;Wash/dry face   Upper Body Bathing: Moderate assistance;Sitting   Lower Body Bathing: Maximal assistance;Sitting/lateral leans   Upper Body Dressing : Moderate assistance;Sitting   Lower Body Dressing: Total assistance;Sit to/from stand   Toilet Transfer: Moderate assistance;BSC/3in1;Rolling walker (2 wheels);Cueing for sequencing Toilet Transfer Details (indicate cue type and reason): able to rise with min assist but needed mod assist walker and to assist with weight shift and heavy cues to take steps to transfer [place] and Hygiene: Total assistance;Sit to/from stand       Functional mobility during ADLs: Moderate assistance;Cueing for sequencing;Cueing for safety;Rolling walker (2 wheels)       Vision   Vision Assessment?: No apparent visual deficits Additional Comments: reports persistent eye drainage today     Perception     Praxis      Pertinent Vitals/Pain Pain Assessment Pain Assessment: No/denies pain     Hand Dominance Right   Extremity/Trunk Assessment Upper Extremity Assessment Upper Extremity Assessment: Generalized weakness   Lower Extremity Assessment Lower Extremity Assessment: Generalized weakness LLE Deficits / Details: pt reports increased weakness in L LE since surgery   Cervical / Trunk Assessment Cervical / Trunk Assessment: Kyphotic   Communication Communication Communication: HOH   Cognition Arousal/Alertness: Awake/alert Behavior During Therapy: WFL for tasks assessed/performed Overall Cognitive Status: History of cognitive impairments - at baseline                                 General Comments: hx dementia, HOH however appropriate and  follows simple commands. Can sound/mumble weird stuff when just waking up per daughter     General Comments       Exercises     Shoulder Instructions      Home Living Family/patient expects to be discharged to:: Unsure                                        Prior Functioning/Environment Prior Level of Function : Needs assist             Mobility Comments: pt reports she is from a facility, appears pt was home until fall and s/p L IM nail in May, pt states facility assisted her with transfer to w/c. Daughter says patient can walk with therapy but nursing staff doesn't help her walk - using the wc insteady. ADLs Comments: needs assistance        OT Problem List: Decreased strength;Decreased activity tolerance;Impaired balance (sitting and/or standing);Decreased safety awareness;Decreased cognition;Decreased knowledge of use of DME or AE      OT Treatment/Interventions: Self-care/ADL training;Therapeutic exercise;DME and/or AE instruction;Therapeutic activities;Balance training;Patient/family education    OT Goals(Current goals can be found in the care plan section) Acute Rehab OT Goals Patient Stated Goal: to do as much as she can OT Goal Formulation: With family Time For Goal Achievement: 05/09/22 Potential to Achieve Goals: Fair  OT Frequency: Min 2X/week    Co-evaluation  AM-PAC OT "6 Clicks" Daily Activity     Outcome Measure Help from another person eating meals?: A Little Help from another person taking care of personal grooming?: A Little Help from another person toileting, which includes using toliet, bedpan, or urinal?: A Lot Help from another person bathing (including washing, rinsing, drying)?: A Lot Help from another person to put on and taking off regular upper body clothing?: A Lot Help from another person to put on and taking off regular lower body clothing?: Total 6 Click Score: 13   End of Session Equipment Utilized  During Treatment: Rolling walker (2 wheels) Nurse Communication: Mobility status  Activity Tolerance: Patient tolerated treatment well Patient left: in bed;with call bell/phone within reach;with bed alarm set;with family/visitor present  OT Visit Diagnosis: Muscle weakness (generalized) (M62.81);Other symptoms and signs involving cognitive function                Time: 1761-6073 OT Time Calculation (min): 17 min Charges:  OT General Charges $OT Visit: 1 Visit OT Evaluation $OT Eval Low Complexity: 1 Low  Kailynn Satterly, OTR/L [place]  Office [phone] Pager: [place] 04/25/2022, 4:23 PM

## 2022-04-26 DIAGNOSIS — K2100 Gastro-esophageal reflux disease with esophagitis, without bleeding: Secondary | ICD-10-CM | POA: Diagnosis not present

## 2022-04-26 DIAGNOSIS — D638 Anemia in other chronic diseases classified elsewhere: Secondary | ICD-10-CM | POA: Diagnosis not present

## 2022-04-26 DIAGNOSIS — I5033 Acute on chronic diastolic (congestive) heart failure: Secondary | ICD-10-CM | POA: Diagnosis not present

## 2022-04-26 DIAGNOSIS — Z515 Encounter for palliative care: Secondary | ICD-10-CM | POA: Diagnosis not present

## 2022-04-26 DIAGNOSIS — N179 Acute kidney failure, unspecified: Secondary | ICD-10-CM | POA: Diagnosis not present

## 2022-04-26 DIAGNOSIS — Z7189 Other specified counseling: Secondary | ICD-10-CM | POA: Diagnosis not present

## 2022-04-26 LAB — GLUCOSE, CAPILLARY
Glucose-Capillary: 154 mg/dL — ABNORMAL HIGH (ref 70–99)
Glucose-Capillary: 162 mg/dL — ABNORMAL HIGH (ref 70–99)
Glucose-Capillary: 180 mg/dL — ABNORMAL HIGH (ref 70–99)
Glucose-Capillary: 204 mg/dL — ABNORMAL HIGH (ref 70–99)

## 2022-04-26 LAB — RENAL FUNCTION PANEL
Albumin: 2.7 g/dL — ABNORMAL LOW (ref 3.5–5.0)
Anion gap: 15 (ref 5–15)
BUN: 107 mg/dL — ABNORMAL HIGH (ref 8–23)
CO2: 20 mmol/L — ABNORMAL LOW (ref 22–32)
Calcium: 8.3 mg/dL — ABNORMAL LOW (ref 8.9–10.3)
Chloride: 96 mmol/L — ABNORMAL LOW (ref 98–111)
Creatinine, Ser: 4.21 mg/dL — ABNORMAL HIGH (ref 0.44–1.00)
GFR, Estimated: 10 mL/min — ABNORMAL LOW (ref >60)
Glucose, Bld: 151 mg/dL — ABNORMAL HIGH (ref 70–99)
Phosphorus: 6.3 mg/dL — ABNORMAL HIGH (ref 2.5–4.6)
Potassium: 3.5 mmol/L (ref 3.5–5.1)
Sodium: 131 mmol/L — ABNORMAL LOW (ref 135–145)

## 2022-04-26 LAB — CBC WITH DIFFERENTIAL/PLATELET
Abs Immature Granulocytes: 0.11 10*3/uL — ABNORMAL HIGH (ref 0.00–0.07)
Basophils Absolute: 0 10*3/uL (ref 0.0–0.1)
Basophils Relative: 0 %
Eosinophils Absolute: 0.2 10*3/uL (ref 0.0–0.5)
Eosinophils Relative: 4 %
HCT: 22.2 % — ABNORMAL LOW (ref 36.0–46.0)
Hemoglobin: 7.3 g/dL — ABNORMAL LOW (ref 12.0–15.0)
Immature Granulocytes: 2 %
Lymphocytes Relative: 14 %
Lymphs Abs: 0.8 10*3/uL (ref 0.7–4.0)
MCH: 32.3 pg (ref 26.0–34.0)
MCHC: 32.9 g/dL (ref 30.0–36.0)
MCV: 98.2 fL (ref 80.0–100.0)
Monocytes Absolute: 0.6 10*3/uL (ref 0.1–1.0)
Monocytes Relative: 10 %
Neutro Abs: 4.1 10*3/uL (ref 1.7–7.7)
Neutrophils Relative %: 70 %
Platelets: 201 10*3/uL (ref 150–400)
RBC: 2.26 MIL/uL — ABNORMAL LOW (ref 3.87–5.11)
RDW: 17.1 % — ABNORMAL HIGH (ref 11.5–15.5)
WBC: 5.9 10*3/uL (ref 4.0–10.5)
nRBC: 0 % (ref 0.0–0.2)

## 2022-04-26 LAB — PHOSPHORUS: Phosphorus: 6.4 mg/dL — ABNORMAL HIGH (ref 2.5–4.6)

## 2022-04-26 LAB — MAGNESIUM: Magnesium: 2.1 mg/dL (ref 1.7–2.4)

## 2022-04-26 MED ORDER — FUROSEMIDE 10 MG/ML IJ SOLN
160.0000 mg | Freq: Three times a day (TID) | INTRAMUSCULAR | Status: DC
  Administered 2022-04-26 – 2022-04-29 (×9): 160 mg via INTRAVENOUS
  Filled 2022-04-26 (×2): qty 16
  Filled 2022-04-26: qty 10
  Filled 2022-04-26 (×5): qty 16
  Filled 2022-04-26: qty 10
  Filled 2022-04-26 (×3): qty 16

## 2022-04-26 MED ORDER — OXYMETAZOLINE HCL 0.05 % NA SOLN
1.0000 | Freq: Two times a day (BID) | NASAL | Status: AC
  Administered 2022-04-26 – 2022-04-28 (×6): 1 via NASAL
  Filled 2022-04-26: qty 15

## 2022-04-26 NOTE — Progress Notes (Signed)
Patient ID: [name], female   DOB: 1941/08/11, 81 y.o.   MRN: [id number] S: No new complaints. O:BP (!) 143/50   Pulse 61   Temp 98.3 F (36.8 C) (Oral)   Resp 19   Ht 5' (1.524 m)   Wt 58.6 kg   SpO2 98%   BMI 25.23 kg/m   Intake/Output Summary (Last 24 hours) at 04/26/2022 1101 Last data filed at 04/26/2022 1020 Gross per 24 hour  Intake 576 ml  Output 1720 ml  Net -1144 ml   Intake/Output: I/O last 3 completed shifts: In: 746.5 [P.O.:610; IV Piggyback:136.5] Out: 3060 [Urine:3060]  Intake/Output this shift:  Total I/O In: 120 [P.O.:120] Out: -  Weight change: -5 kg KVQ:QVZDG, elderly WF in NAD CVS: RRR Resp: diminished BS with poor inspiratory effort Abd:+BS, soft, NT/ND Ext: 1+ pretibial edema bilaterally  Recent Labs  Lab 04/21/22 1641 04/22/22 0029 04/23/22 0423 04/24/22 0450 04/25/22 0830 04/26/22 0333  NA 131* 132* 130* 133* 134* 131*  K 4.7 4.7 4.5 4.4 3.8 3.5  CL 99 98 97* 101 99 96*  CO2 20* 20* 20* 19* 20* 20*  GLUCOSE 226* 165* 131* 142* 157* 151*  BUN 100* 105* 109* 98* 119* 107*  CREATININE 4.43* 4.53* 4.60* 4.64* 4.32* 4.21*  ALBUMIN 2.9* 2.9*  --  2.9* 2.9* 2.7*  CALCIUM 7.8* 7.8* 8.0* 8.4* 8.3* 8.3*  PHOS  --   --   --  8.1* 7.0* 6.3*  6.4*  AST 38 42*  --  32 28  --   ALT 25 26  --  23 23  --    Liver Function Tests: Recent Labs  Lab 04/22/22 0029 04/24/22 0450 04/25/22 0830 04/26/22 0333  AST 42* 32 28  --   ALT '26 23 23  ' --   ALKPHOS 219* 216* 195*  --   BILITOT 0.7 0.9 0.8  --   PROT 5.9* 5.8* 6.0*  --   ALBUMIN 2.9* 2.9* 2.9* 2.7*   No results for input(s): "LIPASE", "AMYLASE" in the last 168 hours. No results for input(s): "AMMONIA" in the last 168 hours. CBC: Recent Labs  Lab 04/22/22 0029 04/23/22 0423 04/24/22 0450 04/25/22 0830 04/26/22 0333  WBC 8.8 8.2 6.1 5.7 5.9  NEUTROABS  --   --  4.8 4.4 4.1  HGB 7.7* 7.5* 7.5* 7.6* 7.3*  HCT 23.7* 23.3* 23.0* 23.2* 22.2*  MCV 100.9* 100.9* 100.4* 99.1 98.2  PLT  160 187 178 198 201   Cardiac Enzymes: No results for input(s): "CKTOTAL", "CKMB", "CKMBINDEX", "TROPONINI" in the last 168 hours. CBG: Recent Labs  Lab 04/25/22 0744 04/25/22 1125 04/25/22 1653 04/25/22 2210 04/26/22 0726  GLUCAP 144* 189* 190* 158* 154*    Iron Studies: No results for input(s): "IRON", "TIBC", "TRANSFERRIN", "FERRITIN" in the last 72 hours. Studies/Results: DG CHEST PORT 1 VIEW  Result Date: 04/25/2022 CLINICAL DATA:  81 year old female with renal failure. Abnormal right lung base opacity last month. EXAM: PORTABLE CHEST 1 VIEW COMPARISON:  Portable chest 04/21/2022 and earlier. FINDINGS: Portable AP semi upright view at 0514 hours. Stable lung volumes with underlying chronic cardiomegaly, prior CABG, left chest cardiac pacemaker. Streaky right infrahilar opacity has regressed since last month but not completely resolved. No pneumothorax. Stable pulmonary vascularity without overt edema. Questionable additional veiling opacity at both lung bases, with small bilateral pleural effusions demonstrated by CT in April. Osteopenia. Chronic proximal left humerus deformity. Paucity of bowel gas in the upper abdomen. IMPRESSION: 1. Regressed but not resolved  right lung base opacity seen last month. Suspicion of underlying small pleural effusions, such that this may be atelectasis. 2. Stable cardiomegaly. No new cardiopulmonary abnormality. Electronically Signed   By: Genevie Ann M.D.   On: 04/25/2022 08:31    aspirin EC  81 mg Oral Daily   carvedilol  12.5 mg Oral BID WC   Chlorhexidine Gluconate Cloth  6 each Topical Daily   enoxaparin (LOVENOX) injection  30 mg Subcutaneous Q24H   feeding supplement  1 Container Oral TID BM   insulin aspart  0-9 Units Subcutaneous TID WC   isosorbide mononitrate  30 mg Oral Daily   levothyroxine  100 mcg Oral QHS   metolazone  5 mg Oral Q24H   multivitamin with minerals  1 tablet Oral Daily   pantoprazole  40 mg Oral QAC breakfast   rosuvastatin   10 mg Oral QHS   sodium chloride flush  10-40 mL Intracatheter Q12H   sodium chloride flush  3 mL Intravenous Q12H    BMET    Component Value Date/Time   NA 131 (L) 04/26/2022 0333   NA 140 11/28/2020 0000   K 3.5 04/26/2022 0333   CL 96 (L) 04/26/2022 0333   CO2 20 (L) 04/26/2022 0333   GLUCOSE 151 (H) 04/26/2022 0333   BUN 107 (H) 04/26/2022 0333   BUN 61 (A) 11/28/2020 0000   CREATININE 4.21 (H) 04/26/2022 0333   CREATININE 1.59 (H) 09/22/2016 1202   CALCIUM 8.3 (L) 04/26/2022 0333   GFRNONAA 10 (L) 04/26/2022 0333   GFRAA 26 11/28/2020 0000   CBC    Component Value Date/Time   WBC 5.9 04/26/2022 0333   RBC 2.26 (L) 04/26/2022 0333   HGB 7.3 (L) 04/26/2022 0333   HGB 11.0 (L) 01/04/2020 1640   HCT 22.2 (L) 04/26/2022 0333   HCT 32.6 (L) 01/04/2020 1640   PLT 201 04/26/2022 0333   PLT 183 01/04/2020 1640   MCV 98.2 04/26/2022 0333   MCV 97 01/04/2020 1640   MCH 32.3 04/26/2022 0333   MCHC 32.9 04/26/2022 0333   RDW 17.1 (H) 04/26/2022 0333   RDW 12.3 01/04/2020 1640   LYMPHSABS 0.8 04/26/2022 0333   MONOABS 0.6 04/26/2022 0333   EOSABS 0.2 04/26/2022 0333   BASOSABS 0.0 04/26/2022 0333    Assessment/ Plan: [name] on CKD 4 -  b/l creat from last 3 months in 2023 is 2.4- 2.8, eGFR 16- 19 ml/min. Creat here peaked at 4.64, eGFR 9 ml/min, in setting of SOB, vol overload/ LE edema, low-normal BP's and gen'd weakness. Pt has dementia/ cognitive issues and is not a candidate for HD per her primary renal MD and family is aware. UA showed pyuria and renal US showed no obstruction. She is vol overloaded and wasn't responding well until Lasix was increased to 160 tid w/ po metolazone at 28m qd. UOP has improved and edema is better per the dtr. Would continue to try to get a successful diuresis for her. BUN/Cr improving slowly.   EOL - pts renal function may be declining towards esrd. Pt is not a HD candidate. Family will be back in town today. Appreciate palliative care assistance.   Anemia of CKD stage IV - transfuse if Hgb continues to drop. Dementia DNR HTN /vol - vol as above. Have lowered coreg to 12.5 bid for now, holding norvasc. BP's are good.  CADsp CABG PAF  SP PPM  JDonetta Potts MD [facility]

## 2022-04-26 NOTE — Plan of Care (Signed)
  Problem: Activity: Goal: Capacity to carry out activities will improve Outcome: Progressing   Problem: Clinical Measurements: Goal: Will remain free from infection Outcome: Progressing   Problem: Nutrition: Goal: Adequate nutrition will be maintained Outcome: Progressing

## 2022-04-26 NOTE — TOC Progression Note (Signed)
Transition of Care [facility]) - Progression Note    Patient Details  Name: [name] MRN: [id number] Date of Birth: [birth year]  Transition of [facility]) CM/SW Contact  Leeroy Cha, RN Phone Number: 04/26/2022, 8:52 AM  Clinical Narrative:    [phone] chart reviewed.  Following for toc needs.  Plan is to admit to residential hospice or go back to [facility] in [place] with hospice.  Family is deciding.   Expected Discharge Plan: [place] Barriers to Discharge: Continued Medical Work up  Expected Discharge Plan and Services Expected Discharge Plan: [place]   Discharge Planning Services: CM Consult   Living arrangements for the past 2 months: [place]                                       Social Determinants of Health (SDOH) Interventions    Readmission Risk Interventions    02/11/2022    4:50 PM  Readmission Risk Prevention Plan  Transportation Screening Complete  PCP or Specialist Appt within 3-5 Days Complete  HRI or [place] Complete  Social Work Consult for [place] Planning/Counseling Complete  Palliative Care Screening Not Applicable  Medication Review [employer]) Complete

## 2022-04-26 NOTE — Progress Notes (Signed)
PROGRESS NOTE    AMARRIA ANDREASEN  GMW:[id number] DOB: 1941/01/18 DOA: 04/21/2022 PCP: Leeroy Cha, MD   Brief Narrative:  [name] is a 81 y.o. female with medical history significant of CAD status post CABG, GERD, paroxysmal A-fib, sick sinus syndrome status post pacemaker, anemia, diabetes, neuropathy, hyperlipidemia, hypertension, hypothyroidism, CKD 4, diastolic CHF, depression, GI bleed presenting with elevated BUN from SNF. Labs here indicated [name] and she appeared volume overloaded. Started on IV lasix and admitted with nephrology consultation.  **Interim History   Nephrology evaluated and recommending increasing Lasix to IV 80 mg 3 times daily and following closely.  They recommended getting a UA, urine lites and renal ultrasound and placing a Foley catheter.  Currently she is not a candidate for hemodialysis and family was made aware by the nephrology team.  Renal function has not really improving with current treatment yesterday stable.  She only had 750 mL of total urine output yesterday.    Nephrology is planning to escalate her IV doses of Lasix to 160 mg 3 times daily and continuing Metolazone 5 mg daily.  Palliative care has been consulted for further goals of care assistance given that we are concerned that patient's renal function is declining towards ESRD and that she is not a HD candidate.  Patient's creatinine is slightly improved but her BUN still elevated.  Palliative involved in discussing hospice options with the patient daughter with another home with home hospice versus back to facility with hospice.  Currently hospice consult is in place to [facility] [place] and this is currently awaiting evaluation.   Assessment and Plan:  [name] on CKD 4 and acute on chronic diastolic heart failure Metabolic Acidosis Hyponatremia in the setting of hypervolemia -SCr 4.43 from baseline of around 2.6. BNP >1200 w/LE edema in setting of decreased diuretic dosing at  SNF.  - Insufficient diuresis with lasix '40mg'$  IV, augment per nephrology to '80mg'$  IV TID but now further increased to 120 mg TID and now 160 mg TID and continuing Metolazone 5 mg po Daily.  - Check U/S, UA micro, lytes and these are pending.  Renal ultrasound done and showed "No hydronephrosis. Bilateral renal parenchymal atrophy with echogenic parenchyma, a nonspecific indicator of medical renal disease." -Urinalysis done and showed turbid appearance with moderate hemoglobin, negative ketones, moderate leukocytes, 100 protein's, many bacteria, present mucus, 6-10 RBCs per high-power field, greater than 50 WBCs - Pt said not to be candidate for HD, family aware.  - Strict I/O, daily weights. She is -4.4865 Liters since admission  - Renal diet with fluid restriction -Patient's BUNs/creatinine went from 105/4.53 -> 109/4.60 -> 98/4.64 (Peak) and is now 119/4.32 yesterday and today it is 107/4.21 and is slowly improving  -She has a mild metabolic acidosis with a CO2 of 20, anion gap of 15, chloride level of 99; sodium went from 132 -> 130 -> 133 and is now 134 -Repeat CXR yesterday AM showed "Regressed but not resolved right lung base opacity seen last month. Suspicion of  underlying small pleural effusions, such that this may be atelectasis. Stable cardiomegaly. No new cardiopulmonary abnormality." -Palliative care consulted for further goals of care discussion as below and hospice or referral has been made and awaiting hospice evaluation   Hyperlipidemia CAD status post CABG and stenting - Continue home Rosuvastatin - Continue home Carvedilol - Continue home Imdur - Continue home Aspirin  GERD/GI Prophylaxis  - Continue PPI with Pantoprazole 40 mg po qHS  Paroxysmal A-fib, SSS  s/p PPM:  -Paced rhythm noted.  Anemia of CKD:  -Patient's hemoglobin/hematocrit went from 7.7/23.7 -> 7.5/23.3 -> 7.5/23.0 -> 7.6/23.2 -> 7.3/22.2 -Check Anemia Panel in a.m. -Continue monitor for signs symptoms  bleeding; no overt bleeding noted -Repeat CBC in the a.m.   Epistaxis -She had a nosebleed approximately midnight.  She had a one-time dose of Afrin which was successful in stopping the patient's nosebleed however she had another 1 and she is given another dose of aspirin and she was added mupirocin to the nares twice daily to prevent further bleeding and added a Ocean nose nasal spray -Continue monitor for further bleeding  T2DM with diabetic neuropathy:  - SSI -CBGs ranging from 154-190 - Hold gabapentin with CrCl < 61m/min.   Hypertension - Holding home amlodipine in the setting of low normal blood pressure, decrease coreg. -Continue to monitor blood pressures per protocol -Last blood pressure reading was  Hypothyroid - Continue home Levothyroxine  Depression, Dementia - Continue home sertraline, delirium precautions   GOC -DNR: PAgrafor further GOC Discussion and discussing hospice options with the patient and daughter; [facility] [place] and awaiting evaluation  DVT prophylaxis: enoxaparin (LOVENOX) injection 30 mg Start: 04/21/22 2200    Code Status: DNR Family Communication: No family present at bedside   Disposition Plan:  Level of care: Telemetry Status is: Inpatient Remains inpatient appropriate because: Nephrology is giving her high-dose Lasix and she needs hospice evaluation which is still pending   Consultants:  Nephrology Palliative care medicine  Procedures:  None  Antimicrobials:  Anti-infectives (From admission, onward)    None       Subjective: Seen and examined at bedside and she was sitting in the chair and remains little confused.  She has had a little difficult time hearing this morning.  No nausea or vomiting.  Has not had any nosebleeds.  Denies any chest pain or shortness of breath.  No other concerns or complaints at this time.  Objective: Vitals:   04/26/22 0423 04/26/22 0549 04/26/22  0815 04/26/22 1304  BP:  (!) 131/49 (!) 143/50 (!) 129/56  Pulse:  60 61 65  Resp:  19  16  Temp:  98.3 F (36.8 C)  98.1 F (36.7 C)  TempSrc:  Oral  Oral  SpO2:  98%  100%  Weight: 58.6 kg     Height:        Intake/Output Summary (Last 24 hours) at 04/26/2022 1418 Last data filed at 04/26/2022 1300 Gross per 24 hour  Intake 456 ml  Output 2570 ml  Net -2114 ml   Filed Weights   04/23/22 0500 04/25/22 0500 04/26/22 0423  Weight: 60.6 kg 63.6 kg 58.6 kg   Examination: Physical Exam:  Constitutional: WN/WD overweight chronically ill-appearing Caucasian female in no acute distress and sitting in the chair bedside Respiratory: Diminished to auscultation bilaterally with coarse breath sounds, no wheezing, rales, rhonchi or crackles. Normal respiratory effort and patient is not tachypenic. No accessory muscle use.  Unlabored breathing Cardiovascular: RRR, no murmurs / rubs / gallops. S1 and S2 auscultated.  She has trace lower extremity edema  Abdomen: Soft, non-tender, distended secondary body habitus. Bowel sounds positive.  GU: Deferred. Musculoskeletal: No clubbing / cyanosis of digits/nails. No joint deformity upper and lower extremities.  Skin: No rashes, lesions, ulcers on limited skin evaluation. No induration; Warm and dry.  Neurologic: CN 2-12 grossly intact with no focal deficits however she has a very difficult  time hearing. Romberg sign and cerebellar reflexes not assessed.  Psychiatric: Normal judgment and insight.  She is awake and alert but not fully oriented  Data Reviewed: I have personally reviewed following labs and imaging studies  CBC: Recent Labs  Lab 04/21/22 1847 04/22/22 0029 04/23/22 0423 04/24/22 0450 04/25/22 0830 04/26/22 0333  WBC 8.9 8.8 8.2 6.1 5.7 5.9  NEUTROABS 7.3  --   --  4.8 4.4 4.1  HGB 7.5* 7.7* 7.5* 7.5* 7.6* 7.3*  HCT 23.0* 23.7* 23.3* 23.0* 23.2* 22.2*  MCV 100.4* 100.9* 100.9* 100.4* 99.1 98.2  PLT 153 160 187 178 198 518    Basic Metabolic Panel: Recent Labs  Lab 04/22/22 0029 04/23/22 0423 04/24/22 0450 04/25/22 0830 04/26/22 0333  NA 132* 130* 133* 134* 131*  K 4.7 4.5 4.4 3.8 3.5  CL 98 97* 101 99 96*  CO2 20* 20* 19* 20* 20*  GLUCOSE 165* 131* 142* 157* 151*  BUN 105* 109* 98* 119* 107*  CREATININE 4.53* 4.60* 4.64* 4.32* 4.21*  CALCIUM 7.8* 8.0* 8.4* 8.3* 8.3*  MG 2.1  --  2.3 2.2 2.1  PHOS  --   --  8.1* 7.0* 6.3*  6.4*   GFR: Estimated Creatinine Clearance: 8.2 mL/min (A) (by C-G formula based on SCr of 4.21 mg/dL (H)). Liver Function Tests: Recent Labs  Lab 04/21/22 1641 04/22/22 0029 04/24/22 0450 04/25/22 0830 04/26/22 0333  AST 38 42* 32 28  --   ALT '25 26 23 23  '$ --   ALKPHOS 217* 219* 216* 195*  --   BILITOT 0.7 0.7 0.9 0.8  --   PROT 5.9* 5.9* 5.8* 6.0*  --   ALBUMIN 2.9* 2.9* 2.9* 2.9* 2.7*   No results for input(s): "LIPASE", "AMYLASE" in the last 168 hours. No results for input(s): "AMMONIA" in the last 168 hours. Coagulation Profile: No results for input(s): "INR", "PROTIME" in the last 168 hours. Cardiac Enzymes: No results for input(s): "CKTOTAL", "CKMB", "CKMBINDEX", "TROPONINI" in the last 168 hours. BNP (last 3 results) No results for input(s): "PROBNP" in the last 8760 hours. HbA1C: No results for input(s): "HGBA1C" in the last 72 hours. CBG: Recent Labs  Lab 04/25/22 1125 04/25/22 1653 04/25/22 2210 04/26/22 0726 04/26/22 1116  GLUCAP 189* 190* 158* 154* 162*   Lipid Profile: No results for input(s): "CHOL", "HDL", "LDLCALC", "TRIG", "CHOLHDL", "LDLDIRECT" in the last 72 hours. Thyroid Function Tests: No results for input(s): "TSH", "T4TOTAL", "FREET4", "T3FREE", "THYROIDAB" in the last 72 hours. Anemia Panel: No results for input(s): "VITAMINB12", "FOLATE", "FERRITIN", "TIBC", "IRON", "RETICCTPCT" in the last 72 hours. Sepsis Labs: No results for input(s): "PROCALCITON", "LATICACIDVEN" in the last 168 hours.  Recent Results (from the past 240  hour(s))  MRSA Next Gen by PCR, Nasal     Status: None   Collection Time: 04/22/22  2:02 PM   Specimen: Nasal Mucosa; Nasal Swab  Result Value Ref Range Status   MRSA by PCR Next Gen NOT DETECTED NOT DETECTED Final    Comment: (NOTE) The GeneXpert MRSA Assay (FDA approved for NASAL specimens only), is one component of a comprehensive MRSA colonization surveillance program. It is not intended to diagnose MRSA infection nor to guide or monitor treatment for MRSA infections. Test performance is not FDA approved in patients less than 5 years old. Performed at [facility], [place] [address], [place], [place] [address]     Radiology Studies: DG CHEST PORT 1 VIEW  Result Date: 04/25/2022 CLINICAL DATA:  81 year old female  with renal failure. Abnormal right lung base opacity last month. EXAM: PORTABLE CHEST 1 VIEW COMPARISON:  Portable chest 04/21/2022 and earlier. FINDINGS: Portable AP semi upright view at 0514 hours. Stable lung volumes with underlying chronic cardiomegaly, prior CABG, left chest cardiac pacemaker. Streaky right infrahilar opacity has regressed since last month but not completely resolved. No pneumothorax. Stable pulmonary vascularity without overt edema. Questionable additional veiling opacity at both lung bases, with small bilateral pleural effusions demonstrated by CT in April. Osteopenia. Chronic proximal left humerus deformity. Paucity of bowel gas in the upper abdomen. IMPRESSION: 1. Regressed but not resolved right lung base opacity seen last month. Suspicion of underlying small pleural effusions, such that this may be atelectasis. 2. Stable cardiomegaly. No new cardiopulmonary abnormality. Electronically Signed   By: Genevie Ann M.D.   On: 04/25/2022 08:31    Scheduled Meds:  aspirin EC  81 mg Oral Daily   carvedilol  12.5 mg Oral BID WC   Chlorhexidine Gluconate Cloth  6 each Topical Daily   enoxaparin (LOVENOX) injection  30 mg Subcutaneous Q24H   feeding  supplement  1 Container Oral TID BM   insulin aspart  0-9 Units Subcutaneous TID WC   isosorbide mononitrate  30 mg Oral Daily   levothyroxine  100 mcg Oral QHS   metolazone  5 mg Oral Q24H   multivitamin with minerals  1 tablet Oral Daily   pantoprazole  40 mg Oral QAC breakfast   rosuvastatin  10 mg Oral QHS   sodium chloride flush  10-40 mL Intracatheter Q12H   sodium chloride flush  3 mL Intravenous Q12H   Continuous Infusions:  furosemide 160 mg (04/26/22 1354)    LOS: 4 days   Raiford Noble, DO [facility] Available via Epic secure chat 7am-7pm After these hours, please refer to coverage provider listed on [email or web address] 04/26/2022, 2:18 PM

## 2022-04-26 NOTE — Progress Notes (Signed)
Daily Progress Note   Patient Name: [name]       Date: 04/26/2022 DOB: 04-Jan-1941  Age: 81 y.o. MRN#: [id number] Attending Physician: Kerney Elbe, DO Primary Care Physician: Leeroy Cha, MD Admit Date: 04/21/2022  Reason for Consultation/Follow-up: Establishing goals of care  Subjective: Sitting in chair, pleasantly confused, no distress.   Length of Stay: 4  Current Medications: Scheduled Meds:   aspirin EC  81 mg Oral Daily   carvedilol  12.5 mg Oral BID WC   Chlorhexidine Gluconate Cloth  6 each Topical Daily   enoxaparin (LOVENOX) injection  30 mg Subcutaneous Q24H   feeding supplement  1 Container Oral TID BM   insulin aspart  0-9 Units Subcutaneous TID WC   isosorbide mononitrate  30 mg Oral Daily   levothyroxine  100 mcg Oral QHS   metolazone  5 mg Oral Q24H   multivitamin with minerals  1 tablet Oral Daily   pantoprazole  40 mg Oral QAC breakfast   rosuvastatin  10 mg Oral QHS   sodium chloride flush  10-40 mL Intracatheter Q12H   sodium chloride flush  3 mL Intravenous Q12H    Continuous Infusions:  furosemide 160 mg (04/26/22 0600)    PRN Meds: acetaminophen **OR** acetaminophen, albuterol, polyethylene glycol, polyvinyl alcohol, sodium chloride, sodium chloride flush  Physical Exam         Weak appearing lady Sitting up in chair Generalized weakness  Vital Signs: BP (!) 143/50   Pulse 61   Temp 98.3 F (36.8 C) (Oral)   Resp 19   Ht 5' (1.524 m)   Wt 58.6 kg   SpO2 98%   BMI 25.23 kg/m  SpO2: SpO2: 98 % O2 Device: O2 Device: Room Air O2 Flow Rate:    Intake/output summary:  Intake/Output Summary (Last 24 hours) at 04/26/2022 1239 Last data filed at 04/26/2022 1020 Gross per 24 hour  Intake 576 ml  Output 1720 ml  Net  -1144 ml    LBM: Last BM Date : 04/23/22 Baseline Weight: Weight: 58 kg Most recent weight: Weight: 58.6 kg       Palliative Assessment/Data:      Patient Active Problem List   Diagnosis Date Noted   Acute on chronic diastolic CHF (congestive heart failure) ([place]) 04/21/2022   Hyponatremia 03/20/2022  Malnutrition of moderate degree 03/19/2022   Closed intertrochanteric fracture of left hip ([place]) 03/18/2022   Cardiac pacemaker in situ 03/18/2022   DNR (do not resuscitate)/DNI(Do Not Intubate) 03/18/2022   Acute renal failure superimposed on stage 4 chronic kidney disease ([place]) 01/03/2022   Mild vascular neurocognitive disorder 09/05/2019   Major depressive disorder with anxious distress 09/05/2019   Acute postoperative anemia due to expected blood loss 05/21/2019   Anemia of chronic disease 05/21/2019   Anemia in chronic kidney disease 05/21/2019   SSS (sick sinus syndrome) (HCC)    Paroxysmal atrial fibrillation (HCC) - not on systemic anticoagulation due to hx of GI bleeding    S/P placement of cardiac pacemaker    Status post coronary artery stent placement    Abnormal nuclear stress test    Pain of left shoulder joint on movement 06/16/2018   Spinal stenosis of lumbar region 02/28/2018   On anticoagulant therapy 04/22/2017   Tachycardia-bradycardia syndrome ([place]) 12/23/2016   Chronic diastolic CHF (congestive heart failure) ([place]) 08/09/2016   Paroxysmal atrial flutter ([place]) 08/08/2016   Coronary artery disease    Hypertensive heart disease    CKD (chronic kidney disease) stage 4, GFR 15-29 ml/min (HCC)    Hypothyroidism    Osteoarthritis of right knee 07/02/2014   Diabetic peripheral neuropathy associated with type 1 diabetes mellitus ([place]) 09/05/2013   Hypertension associated with diabetes ([place]) 01/06/2012   S/P CABG (coronary artery bypass graft), 12/04/11 12/07/2011   Hyperlipidemia associated with type 2 diabetes mellitus ([place]) 12/03/2011   Family history of  early CAD 12/01/2011   Insulin dependent type 2 diabetes mellitus ([place]) 12/14/2010   DYSPHAGIA UNSPECIFIED 12/14/2010   ESOPHAGEAL STRICTURE 11/28/2009   GERD 11/28/2009   FLATULENCE-GAS-BLOATING 11/28/2009   CONSTIPATION, CHRONIC 12/19/2007   MENOPAUSE, SURGICAL 12/19/2007   BACK PAIN, LUMBAR 12/19/2007   TOTAL KNEE REPLACEMENT, LEFT, HX OF 12/19/2007    Palliative Care Assessment & Plan   Patient Profile:    Assessment:  [name] is a 81 y.o. female with medical history significant of CAD status post CABG, GERD, paroxysmal A-fib, sick sinus syndrome status post pacemaker, anemia, diabetes, neuropathy, hyperlipidemia, hypertension, hypothyroidism, CKD 4, diastolic CHF, depression, GI bleed presenting with elevated BUN from SNF. Labs here indicated [name] and she appeared volume overloaded. Started on IV lasix and admitted with nephrology consultation.     Nephrology evaluated and recommending increasing Lasix to IV 80 mg 3 times daily and following closely.  They recommended getting a UA, urine lites and renal ultrasound and placing a Foley catheter.  Currently she is not a candidate for hemodialysis and family was made aware by the nephrology team.  Renal function has not really improving   Nephrology is planning to escalate her IV doses of Lasix to 100 mg 3 times daily and adding metolazone 5 mg daily.  Palliative care has been consulted for further goals of care assistance given that we are concerned that patient's renal function is declining towards ESRD and that she is not a HD candidate.  Recommendations/Plan:  Hospice consult to further discuss appropriate disposition options, continue current mode of care, now new PMT specific recommendations/no new unmanaged symptoms at this time.     Code Status:    Code Status Orders  (From admission, onward)           Start     Ordered   04/21/22 2017  Do not attempt resuscitation (DNR)  Continuous       Question  Answer Comment   In the event of cardiac or respiratory ARREST Do not call a "code blue"   In the event of cardiac or respiratory ARREST Do not perform Intubation, CPR, defibrillation or ACLS   In the event of cardiac or respiratory ARREST Use medication by any route, position, wound care, and other measures to relive pain and suffering. May use oxygen, suction and manual treatment of airway obstruction as needed for comfort.   Comments verified with pt and dtr [facility]      04/21/22 2022           Code Status History     Date Active Date Inactive Code Status Order ID Comments User Context   03/18/2022 2031 03/23/2022 1811 DNR [id number]  Kristopher Oppenheim, DO Inpatient   03/18/2022 1008 03/18/2022 2031 DNR [id number]  Kristopher Oppenheim, DO ED   03/18/2022 0440 03/18/2022 1008 DNR [id number]  Kristopher Oppenheim, DO ED   02/10/2022 1302 02/12/2022 2021 DNR [id number]  Norval Morton, MD ED   02/10/2022 1136 02/10/2022 1302 Full Code [id number]  Norval Morton, MD ED   01/03/2022 2301 01/09/2022 1734 DNR [id number]  Orene Desanctis, DO ED   08/10/2021 1941 08/11/2021 2042 DNR [id number]  Lenore Cordia, MD ED   10/03/2020 1747 10/05/2020 1941 Full Code [id number]  Norval Morton, MD ED   05/15/2019 0323 05/22/2019 1712 Full Code [id number]  Rise [name], MD ED   09/27/2018 1722 09/28/2018 1514 Full Code [id number]  Troy Sine, MD Inpatient   09/06/2018 1456 09/06/2018 2155 Full Code [id number]  Troy Sine, MD Inpatient   08/08/2016 2159 08/14/2016 1844 Full Code [id number]  Karmen Bongo, MD Inpatient   08/08/2016 2009 08/08/2016 2159 Full Code [id number]  Karmen Bongo, MD ED   05/23/2016 1611 05/25/2016 2009 Full Code [id number]  Rondel Jumbo, PA-C ED   01/03/2012 1649 01/07/2012 1439 Full Code [id number]  Margaretmary Dys, RN Inpatient   12/24/2011 1830 12/29/2011 2044 Full Code [id number]  Georgiana Shore, RN Inpatient   12/01/2011 1831 12/10/2011 1442 Full Code [id number]  Alda Berthold, RN Inpatient      Advance Directive  Documentation    Flowsheet Row Most Recent Value  Type of Advance Directive Healthcare Power of Attorney, Living will  Pre-existing out of facility DNR order (yellow form or pink MOST form) --  "MOST" Form in Place? --       Prognosis:  Guarded, 2-3 weeks if ongoing progressive renal failure.   Discharge Planning: Back to SNF with hospice versus home with hospice, TOC and [facility] consulted.   Care plan was discussed with IDT  Thank you for allowing the Palliative Medicine Team to assist in the care of this patient.    MOD MDM     Greater than 50%  of this time was spent counseling and coordinating care related to the above assessment and plan.  Loistine Chance, MD  Please contact Palliative Medicine Team phone at [phone] for questions and concerns.

## 2022-04-26 NOTE — Care Management Important Message (Signed)
Important Message  Patient Details IM Letter placed in Patients room. Name: [name] MRN: [id number] Date of Birth: Mar 12, 1941   Medicare Important Message Given:  Yes     Kerin Salen 04/26/2022, 1:08 PM

## 2022-04-27 DIAGNOSIS — N179 Acute kidney failure, unspecified: Secondary | ICD-10-CM | POA: Diagnosis not present

## 2022-04-27 DIAGNOSIS — I5033 Acute on chronic diastolic (congestive) heart failure: Secondary | ICD-10-CM | POA: Diagnosis not present

## 2022-04-27 DIAGNOSIS — D638 Anemia in other chronic diseases classified elsewhere: Secondary | ICD-10-CM | POA: Diagnosis not present

## 2022-04-27 DIAGNOSIS — K2100 Gastro-esophageal reflux disease with esophagitis, without bleeding: Secondary | ICD-10-CM | POA: Diagnosis not present

## 2022-04-27 LAB — COMPREHENSIVE METABOLIC PANEL
ALT: 23 U/L (ref 0–44)
AST: 31 U/L (ref 15–41)
Albumin: 2.9 g/dL — ABNORMAL LOW (ref 3.5–5.0)
Alkaline Phosphatase: 163 U/L — ABNORMAL HIGH (ref 38–126)
Anion gap: 13 (ref 5–15)
BUN: 114 mg/dL — ABNORMAL HIGH (ref 8–23)
CO2: 26 mmol/L (ref 22–32)
Calcium: 8.8 mg/dL — ABNORMAL LOW (ref 8.9–10.3)
Chloride: 95 mmol/L — ABNORMAL LOW (ref 98–111)
Creatinine, Ser: 3.9 mg/dL — ABNORMAL HIGH (ref 0.44–1.00)
GFR, Estimated: 11 mL/min — ABNORMAL LOW (ref >60)
Glucose, Bld: 147 mg/dL — ABNORMAL HIGH (ref 70–99)
Potassium: 3.4 mmol/L — ABNORMAL LOW (ref 3.5–5.1)
Sodium: 134 mmol/L — ABNORMAL LOW (ref 135–145)
Total Bilirubin: 0.7 mg/dL (ref 0.3–1.2)
Total Protein: 6.1 g/dL — ABNORMAL LOW (ref 6.5–8.1)

## 2022-04-27 LAB — CBC WITH DIFFERENTIAL/PLATELET
Abs Immature Granulocytes: 0.06 10*3/uL (ref 0.00–0.07)
Basophils Absolute: 0 10*3/uL (ref 0.0–0.1)
Basophils Relative: 0 %
Eosinophils Absolute: 0.3 10*3/uL (ref 0.0–0.5)
Eosinophils Relative: 5 %
HCT: 23.5 % — ABNORMAL LOW (ref 36.0–46.0)
Hemoglobin: 7.7 g/dL — ABNORMAL LOW (ref 12.0–15.0)
Immature Granulocytes: 1 %
Lymphocytes Relative: 12 %
Lymphs Abs: 0.8 10*3/uL (ref 0.7–4.0)
MCH: 32.4 pg (ref 26.0–34.0)
MCHC: 32.8 g/dL (ref 30.0–36.0)
MCV: 98.7 fL (ref 80.0–100.0)
Monocytes Absolute: 0.8 10*3/uL (ref 0.1–1.0)
Monocytes Relative: 12 %
Neutro Abs: 4.5 10*3/uL (ref 1.7–7.7)
Neutrophils Relative %: 70 %
Platelets: 220 10*3/uL (ref 150–400)
RBC: 2.38 MIL/uL — ABNORMAL LOW (ref 3.87–5.11)
RDW: 17.2 % — ABNORMAL HIGH (ref 11.5–15.5)
WBC: 6.5 10*3/uL (ref 4.0–10.5)
nRBC: 0 % (ref 0.0–0.2)

## 2022-04-27 LAB — RENAL FUNCTION PANEL
Albumin: 2.9 g/dL — ABNORMAL LOW (ref 3.5–5.0)
Anion gap: 14 (ref 5–15)
BUN: 119 mg/dL — ABNORMAL HIGH (ref 8–23)
CO2: 26 mmol/L (ref 22–32)
Calcium: 8.9 mg/dL (ref 8.9–10.3)
Chloride: 95 mmol/L — ABNORMAL LOW (ref 98–111)
Creatinine, Ser: 4.02 mg/dL — ABNORMAL HIGH (ref 0.44–1.00)
GFR, Estimated: 11 mL/min — ABNORMAL LOW (ref >60)
Glucose, Bld: 148 mg/dL — ABNORMAL HIGH (ref 70–99)
Phosphorus: 6 mg/dL — ABNORMAL HIGH (ref 2.5–4.6)
Potassium: 3.4 mmol/L — ABNORMAL LOW (ref 3.5–5.1)
Sodium: 135 mmol/L (ref 135–145)

## 2022-04-27 LAB — IRON AND TIBC
Iron: 30 ug/dL (ref 28–170)
Saturation Ratios: 15 % (ref 10.4–31.8)
TIBC: 197 ug/dL — ABNORMAL LOW (ref 250–450)
UIBC: 167 ug/dL

## 2022-04-27 LAB — RETICULOCYTES
Immature Retic Fract: 31.3 % — ABNORMAL HIGH (ref 2.3–15.9)
RBC.: 2.3 MIL/uL — ABNORMAL LOW (ref 3.87–5.11)
Retic Count, Absolute: 53.1 10*3/uL (ref 19.0–186.0)
Retic Ct Pct: 2.3 % (ref 0.4–3.1)

## 2022-04-27 LAB — FOLATE: Folate: 17.2 ng/mL (ref >5.9)

## 2022-04-27 LAB — GLUCOSE, CAPILLARY
Glucose-Capillary: 180 mg/dL — ABNORMAL HIGH (ref 70–99)
Glucose-Capillary: 172 mg/dL — ABNORMAL HIGH (ref 70–99)
Glucose-Capillary: 237 mg/dL — ABNORMAL HIGH (ref 70–99)
Glucose-Capillary: 220 mg/dL — ABNORMAL HIGH (ref 70–99)

## 2022-04-27 LAB — MAGNESIUM: Magnesium: 2.1 mg/dL (ref 1.7–2.4)

## 2022-04-27 LAB — PHOSPHORUS: Phosphorus: 6 mg/dL — ABNORMAL HIGH (ref 2.5–4.6)

## 2022-04-27 LAB — FERRITIN: Ferritin: 336 ng/mL — ABNORMAL HIGH (ref 11–307)

## 2022-04-27 LAB — VITAMIN B12: Vitamin B-12: 652 pg/mL (ref 180–914)

## 2022-04-27 NOTE — Plan of Care (Signed)
  Problem: Activity: Goal: Capacity to carry out activities will improve Outcome: Progressing   Problem: Clinical Measurements: Goal: Ability to maintain clinical measurements within normal limits will improve Outcome: Progressing Goal: Will remain free from infection Outcome: Progressing Goal: Diagnostic test results will improve Outcome: Progressing

## 2022-04-27 NOTE — Plan of Care (Signed)
  Problem: Cardiac: Goal: Ability to achieve and maintain adequate cardiopulmonary perfusion will improve Outcome: Progressing   Problem: Nutrition: Goal: Adequate nutrition will be maintained Outcome: Progressing   Problem: Elimination: Goal: Will not experience complications related to bowel motility Outcome: Progressing Goal: Will not experience complications related to urinary retention Outcome: Progressing

## 2022-04-27 NOTE — Progress Notes (Signed)
PROGRESS NOTE    [name]  GEX:[id number] DOB: 23-Apr-1941 DOA: 04/21/2022 PCP: Leeroy Cha, MD   Brief Narrative:  [name] is a 81 y.o. female with medical history significant of CAD status post CABG, GERD, paroxysmal A-fib, sick sinus syndrome status post pacemaker, anemia, diabetes, neuropathy, hyperlipidemia, hypertension, hypothyroidism, CKD 4, diastolic CHF, depression, GI bleed presenting with elevated BUN from SNF. Labs here indicated [name] and she appeared volume overloaded. Started on IV lasix and admitted with nephrology consultation.  **Interim History   Nephrology evaluated and recommending increasing Lasix to IV 80 mg 3 times daily and following closely.  They recommended getting a UA, urine lites and renal ultrasound and placing a Foley catheter.  Currently she is not a candidate for hemodialysis and family was made aware by the nephrology team.  Renal function has not really improving with current treatment yesterday stable.  She only had 750 mL of total urine output yesterday.    Nephrology is planning to escalate her IV doses of Lasix to 160 mg 3 times daily and continuing Metolazone 5 mg daily.  Palliative care has been consulted for further goals of care assistance given that we are concerned that patient's renal function is declining towards ESRD and that she is not a HD candidate.  Patient's creatinine is slightly improved but her BUN still elevated.  Palliative involved in discussing hospice options with the patient daughter with another home with home hospice versus back to facility with hospice.  Currently hospice consult is in place to [facility] of the [place] and this is currently awaiting evaluation.   Assessment and Plan:  [name] on CKD 4 and acute on chronic diastolic heart failure Metabolic Acidosis Hyponatremia in the setting of hypervolemia -SCr 4.43 from baseline of around 2.6. BNP >1200 w/LE edema in setting of decreased diuretic dosing at  SNF.  - Insufficient diuresis with lasix '40mg'$  IV, augment per nephrology to '80mg'$  IV TID but now further increased to 120 mg TID and now 160 mg TID and continuing Metolazone 5 mg however changing from p.o. daily to Monday Wednesday Friday.  - Check U/S, UA micro, lytes and these are pending.  Renal ultrasound done and showed "No hydronephrosis. Bilateral renal parenchymal atrophy with echogenic parenchyma, a nonspecific indicator of medical renal disease." -Urinalysis done and showed turbid appearance with moderate hemoglobin, negative ketones, moderate leukocytes, 100 protein's, many bacteria, present mucus, 6-10 RBCs per high-power field, greater than 50 WBCs - Pt said not to be candidate for HD, family aware.  - Strict I/O, daily weights. She is - 5.8475 liters since admission  - Renal diet with fluid restriction -Patient's BUNs/creatinine went from 105/4.53 -> 109/4.60 -> 98/4.64 (Peak) and is now 119/4.32 -> 107/4.21 and today was 119/4.02 and is slowly improving  -She has a mild metabolic acidosis with a CO2 of 20, anion gap of 15, chloride level of 99; sodium went from 132 -> 130 -> 133 and is now 134 -Repeat CXR the day before AM showed "Regressed but not resolved right lung base opacity seen last month. Suspicion of  underlying small pleural effusions, such that this may be atelectasis. Stable cardiomegaly. No new cardiopulmonary abnormality." -Palliative care consulted for further goals of care discussion as below and hospice or referral has been made and awaiting hospice evaluation   Hyperlipidemia CAD status post CABG and stenting - Continue home Rosuvastatin - Continue home Carvedilol - Continue home Imdur - Continue home Aspirin  GERD/GI Prophylaxis  - Continue PPI  with Pantoprazole 40 mg po qHS  Paroxysmal A-fib, SSS s/p PPM:  -Paced rhythm noted.  Hypokalemia -Patient's potassium is now 3.4 -Mag level was 2.1 -Replete with p.o. KCl 40 mEq x 1 -Continue to monitor and trend  and repeat CMP in the AM  Anemia of CKD:  -Patient's hemoglobin/hematocrit went from 7.7/23.7 -> 7.5/23.3 -> 7.5/23.0 -> 7.6/23.2 -> 7.3/22.2 -> 7.7/23.5 -Check Anemia Panel and showed an iron level of 30, U IBC 167, TIBC 197, saturation ratio of 15%, ferritin level 336, folate level 17.2 and a vitamin B12 level 652 -Continue monitor for signs symptoms bleeding; no overt bleeding noted -Repeat CBC in the a.m.   Epistaxis -She had a nosebleed approximately midnight and has been having nose bleeds.  She had a one-time dose of Afrin which was successful in stopping the patient's nosebleed however she had another 1 and she is given another dose of aspirin and she was added mupirocin to the nares twice daily to prevent further bleeding and added a Ocean nose nasal spray -Continue monitor for further bleeding and she continues to have some so will stop ASA and Pharmacologic Prophylaxis -Patient keeps picking her nose given dementia; If not improving will need a [place] and ENT consult  T2DM with diabetic neuropathy:  - SSI -CBGs ranging from 154-204 - Hold gabapentin with CrCl < 76m/min.   Hypertension - Holding home amlodipine in the setting of low normal blood pressure, decrease coreg. -Continue to monitor blood pressures per protocol -Last blood pressure reading was 137/44  Hypothyroid - Continue home Levothyroxine  Depression, Dementia - Continue home sertraline, delirium precautions   GOC -DNR: POA -Palliative Care Consulted for further GOC Discussion and discussing hospice options with the patient and daughter; [facility] [place] and awaiting evaluation  DVT prophylaxis:   SCDs as pharmacological prophylaxis been held given epistaxis    Code Status: DNR Family Communication: No family currently at bedside  Disposition Plan:  Level of care: Telemetry Status is: Inpatient Remains inpatient appropriate because: Needs hospice evaluation    Consultants:  Palliative care Nephrology  Procedures:  None  Antimicrobials:  Anti-infectives (From admission, onward)    None       Subjective: Seen and examined at bedside and she had some nosebleeding earlier but this is now stopped.  Continues to remain confused.  Denies any nausea or vomiting.  Denies any chest pain or shortness of breath.  Also denies any pain currently at this time.  No other concerns or complaints at this time.  Objective: Vitals:   04/27/22 0439 04/27/22 0442 04/27/22 0815 04/27/22 1438  BP: (!) 140/48  (!) 148/51 (!) 137/44  Pulse: 61  62 64  Resp: 15   16  Temp: 98.3 F (36.8 C)   98.8 F (37.1 C)  TempSrc: Oral     SpO2: 99%   99%  Weight:  57.8 kg    Height:        Intake/Output Summary (Last 24 hours) at 04/27/2022 1616 Last data filed at 04/27/2022 1300 Gross per 24 hour  Intake 1089 ml  Output 2450 ml  Net -1361 ml   Filed Weights   04/25/22 0500 04/26/22 0423 04/27/22 0442  Weight: 63.6 kg 58.6 kg 57.8 kg   Examination: Physical Exam:  Constitutional: WN/WD overweight chronically ill-appearing Caucasian female currently in no acute distress and is pleasantly demented and confused Respiratory: Diminished to auscultation bilaterally with coarse breath sounds, no wheezing, rales, rhonchi or crackles. Normal  respiratory effort and patient is not tachypenic. No accessory muscle use.  Unlabored breathing Cardiovascular: RRR, no murmurs / rubs / gallops. S1 and S2 auscultated.  Has 1+ lower extremity edema Abdomen: Soft, non-tender, distended secondary body habitus. Bowel sounds positive.  GU: Deferred. Musculoskeletal: No clubbing / cyanosis of digits/nails. No joint deformity upper and lower extremities.  Skin: No rashes, lesions, ulcers on limited skin evaluation. No induration; Warm and dry.  Neurologic: CN 2-12 grossly intact with no focal deficits however is hard of hearing.  Romberg sign cerebellar reflexes not assessed.   Psychiatric: Normal judgment and insight.  She is awake and alert but not oriented  Data Reviewed: I have personally reviewed following labs and imaging studies  CBC: Recent Labs  Lab 04/21/22 1847 04/22/22 0029 04/23/22 0423 04/24/22 0450 04/25/22 0830 04/26/22 0333 04/27/22 0616  WBC 8.9   < > 8.2 6.1 5.7 5.9 6.5  NEUTROABS 7.3  --   --  4.8 4.4 4.1 4.5  HGB 7.5*   < > 7.5* 7.5* 7.6* 7.3* 7.7*  HCT 23.0*   < > 23.3* 23.0* 23.2* 22.2* 23.5*  MCV 100.4*   < > 100.9* 100.4* 99.1 98.2 98.7  PLT 153   < > 187 178 198 201 220   < > = values in this interval not displayed.   Basic Metabolic Panel: Recent Labs  Lab 04/22/22 0029 04/23/22 0423 04/24/22 0450 04/25/22 0830 04/26/22 0333 04/27/22 0616  NA 132* 130* 133* 134* 131* 134*  135  K 4.7 4.5 4.4 3.8 3.5 3.4*  3.4*  CL 98 97* 101 99 96* 95*  95*  CO2 20* 20* 19* 20* 20* 26  26  GLUCOSE 165* 131* 142* 157* 151* 147*  148*  BUN 105* 109* 98* 119* 107* 114*  119*  CREATININE 4.53* 4.60* 4.64* 4.32* 4.21* 3.90*  4.02*  CALCIUM 7.8* 8.0* 8.4* 8.3* 8.3* 8.8*  8.9  MG 2.1  --  2.3 2.2 2.1 2.1  PHOS  --   --  8.1* 7.0* 6.3*  6.4* 6.0*  6.0*   GFR: Estimated Creatinine Clearance: 8.6 mL/min (A) (by C-G formula based on SCr of 4.02 mg/dL (H)). Liver Function Tests: Recent Labs  Lab 04/21/22 1641 04/22/22 0029 04/24/22 0450 04/25/22 0830 04/26/22 0333 04/27/22 0616  AST 38 42* 32 28  --  31  ALT '25 26 23 23  '$ --  23  ALKPHOS 217* 219* 216* 195*  --  163*  BILITOT 0.7 0.7 0.9 0.8  --  0.7  PROT 5.9* 5.9* 5.8* 6.0*  --  6.1*  ALBUMIN 2.9* 2.9* 2.9* 2.9* 2.7* 2.9*  2.9*   No results for input(s): "LIPASE", "AMYLASE" in the last 168 hours. No results for input(s): "AMMONIA" in the last 168 hours. Coagulation Profile: No results for input(s): "INR", "PROTIME" in the last 168 hours. Cardiac Enzymes: No results for input(s): "CKTOTAL", "CKMB", "CKMBINDEX", "TROPONINI" in the last 168 hours. BNP (last 3  results) No results for input(s): "PROBNP" in the last 8760 hours. HbA1C: No results for input(s): "HGBA1C" in the last 72 hours. CBG: Recent Labs  Lab 04/26/22 1116 04/26/22 1611 04/26/22 2039 04/27/22 0814 04/27/22 1154  GLUCAP 162* 180* 204* 180* 172*   Lipid Profile: No results for input(s): "CHOL", "HDL", "LDLCALC", "TRIG", "CHOLHDL", "LDLDIRECT" in the last 72 hours. Thyroid Function Tests: No results for input(s): "TSH", "T4TOTAL", "FREET4", "T3FREE", "THYROIDAB" in the last 72 hours. Anemia Panel: Recent Labs    04/27/22 0616  [id number] 174  FOLATE 17.2  FERRITIN 336*  TIBC 197*  IRON 30  RETICCTPCT 2.3   Sepsis Labs: No results for input(s): "PROCALCITON", "LATICACIDVEN" in the last 168 hours.  Recent Results (from the past 240 hour(s))  MRSA Next Gen by PCR, Nasal     Status: None   Collection Time: 04/22/22  2:02 PM   Specimen: Nasal Mucosa; Nasal Swab  Result Value Ref Range Status   MRSA by PCR Next Gen NOT DETECTED NOT DETECTED Final    Comment: (NOTE) The GeneXpert MRSA Assay (FDA approved for NASAL specimens only), is one component of a comprehensive MRSA colonization surveillance program. It is not intended to diagnose MRSA infection nor to guide or monitor treatment for MRSA infections. Test performance is not FDA approved in patients less than 91 years old. Performed at [facility], [place] [address] [place], [place] [address]     Radiology Studies: No results found.  Scheduled Meds:  carvedilol  12.5 mg Oral BID WC   Chlorhexidine Gluconate Cloth  6 each Topical Daily   feeding supplement  1 Container Oral TID BM   insulin aspart  0-9 Units Subcutaneous TID WC   isosorbide mononitrate  30 mg Oral Daily   levothyroxine  100 mcg Oral QHS   metolazone  5 mg Oral Q24H   multivitamin with minerals  1 tablet Oral Daily   oxymetazoline  1 spray Each Nare BID   pantoprazole  40 mg Oral QAC breakfast   rosuvastatin  10 mg  Oral QHS   sodium chloride flush  10-40 mL Intracatheter Q12H   sodium chloride flush  3 mL Intravenous Q12H   Continuous Infusions:  furosemide 160 mg (04/27/22 1448)    LOS: 5 days   Raiford Noble, DO [facility] Available via Epic secure chat 7am-7pm After these hours, please refer to coverage provider listed on [email or web address] 04/27/2022, 4:16 PM

## 2022-04-27 NOTE — Progress Notes (Signed)
Pt continue to have nose bleed with clots. MD [facility] notified. See orders.

## 2022-04-27 NOTE — Progress Notes (Signed)
Patient ID: [name], female   DOB: 1941/07/22, 81 y.o.   MRN: [id number] S: Complaining of bloody nose "for 2 days" and admits to spitting some out of her mouth. O:BP (!) 148/51   Pulse 62   Temp 98.3 F (36.8 C) (Oral)   Resp 15   Ht 5' (1.524 m)   Wt 57.8 kg   SpO2 99%   BMI 24.89 kg/m   Intake/Output Summary (Last 24 hours) at 04/27/2022 1105 Last data filed at 04/27/2022 1104 Gross per 24 hour  Intake 969 ml  Output 3300 ml  Net -2331 ml   Intake/Output: I/O last 3 completed shifts: In: 1065 [P.O.:867; IV Piggyback:198] Out: 9357 [Urine:3570]  Intake/Output this shift:  Total I/O In: 240 [P.O.:240] Out: 900 [Urine:900] Weight change: -0.8 kg Gen: frail, chronically ill-appearing female lying in bed in NAD CVS:RRR Resp: CTA Abd: +BS, soft, NT/ND Ext: trace presacral edema  Recent Labs  Lab 04/21/22 1641 04/22/22 0029 04/23/22 0423 04/24/22 0450 04/25/22 0830 04/26/22 0333 04/27/22 0616  NA 131* 132* 130* 133* 134* 131* 134*  135  K 4.7 4.7 4.5 4.4 3.8 3.5 3.4*  3.4*  CL 99 98 97* 101 99 96* 95*  95*  CO2 20* 20* 20* 19* 20* 20* 26  26  GLUCOSE 226* 165* 131* 142* 157* 151* 147*  148*  BUN 100* 105* 109* 98* 119* 107* 114*  119*  CREATININE 4.43* 4.53* 4.60* 4.64* 4.32* 4.21* 3.90*  4.02*  ALBUMIN 2.9* 2.9*  --  2.9* 2.9* 2.7* 2.9*  2.9*  CALCIUM 7.8* 7.8* 8.0* 8.4* 8.3* 8.3* 8.8*  8.9  PHOS  --   --   --  8.1* 7.0* 6.3*  6.4* 6.0*  6.0*  AST 38 42*  --  32 28  --  31  ALT 25 26  --  23 23  --  23   Liver Function Tests: Recent Labs  Lab 04/24/22 0450 04/25/22 0830 04/26/22 0333 04/27/22 0616  AST 32 28  --  31  ALT 23 23  --  23  ALKPHOS 216* 195*  --  163*  BILITOT 0.9 0.8  --  0.7  PROT 5.8* 6.0*  --  6.1*  ALBUMIN 2.9* 2.9* 2.7* 2.9*  2.9*   No results for input(s): "LIPASE", "AMYLASE" in the last 168 hours. No results for input(s): "AMMONIA" in the last 168 hours. CBC: Recent Labs  Lab 04/23/22 0423 04/24/22 0450  04/25/22 0830 04/26/22 0333 04/27/22 0616  WBC 8.2 6.1 5.7 5.9 6.5  NEUTROABS  --  4.8 4.4 4.1 4.5  HGB 7.5* 7.5* 7.6* 7.3* 7.7*  HCT 23.3* 23.0* 23.2* 22.2* 23.5*  MCV 100.9* 100.4* 99.1 98.2 98.7  PLT 187 178 198 201 220   Cardiac Enzymes: No results for input(s): "CKTOTAL", "CKMB", "CKMBINDEX", "TROPONINI" in the last 168 hours. CBG: Recent Labs  Lab 04/26/22 0726 04/26/22 1116 04/26/22 1611 04/26/22 2039 04/27/22 0814  GLUCAP 154* 162* 180* 204* 180*    Iron Studies:  Recent Labs    04/27/22 0616  IRON 30  TIBC 197*  FERRITIN 336*   Studies/Results: No results found.  carvedilol  12.5 mg Oral BID WC   Chlorhexidine Gluconate Cloth  6 each Topical Daily   feeding supplement  1 Container Oral TID BM   insulin aspart  0-9 Units Subcutaneous TID WC   isosorbide mononitrate  30 mg Oral Daily   levothyroxine  100 mcg Oral QHS   metolazone  5 mg Oral  Q24H   multivitamin with minerals  1 tablet Oral Daily   oxymetazoline  1 spray Each Nare BID   pantoprazole  40 mg Oral QAC breakfast   rosuvastatin  10 mg Oral QHS   sodium chloride flush  10-40 mL Intracatheter Q12H   sodium chloride flush  3 mL Intravenous Q12H    BMET    Component Value Date/Time   NA 135 04/27/2022 0616   NA 134 (L) 04/27/2022 0616   NA 140 11/28/2020 0000   K 3.4 (L) 04/27/2022 0616   K 3.4 (L) 04/27/2022 0616   CL 95 (L) 04/27/2022 0616   CL 95 (L) 04/27/2022 0616   CO2 26 04/27/2022 0616   CO2 26 04/27/2022 0616   GLUCOSE 148 (H) 04/27/2022 0616   GLUCOSE 147 (H) 04/27/2022 0616   BUN 119 (H) 04/27/2022 0616   BUN 114 (H) 04/27/2022 0616   BUN 61 (A) 11/28/2020 0000   CREATININE 4.02 (H) 04/27/2022 0616   CREATININE 3.90 (H) 04/27/2022 0616   CREATININE 1.59 (H) 09/22/2016 1202   CALCIUM 8.9 04/27/2022 0616   CALCIUM 8.8 (L) 04/27/2022 0616   GFRNONAA 11 (L) 04/27/2022 0616   GFRNONAA 11 (L) 04/27/2022 0616   GFRAA 26 11/28/2020 0000   CBC    Component Value Date/Time    WBC 6.5 04/27/2022 0616   RBC 2.38 (L) 04/27/2022 0616   RBC 2.30 (L) 04/27/2022 0616   HGB 7.7 (L) 04/27/2022 0616   HGB 11.0 (L) 01/04/2020 1640   HCT 23.5 (L) 04/27/2022 0616   HCT 32.6 (L) 01/04/2020 1640   PLT 220 04/27/2022 0616   PLT 183 01/04/2020 1640   MCV 98.7 04/27/2022 0616   MCV 97 01/04/2020 1640   MCH 32.4 04/27/2022 0616   MCHC 32.8 04/27/2022 0616   RDW 17.2 (H) 04/27/2022 0616   RDW 12.3 01/04/2020 1640   LYMPHSABS 0.8 04/27/2022 0616   MONOABS 0.8 04/27/2022 0616   EOSABS 0.3 04/27/2022 0616   BASOSABS 0.0 04/27/2022 0616    Assessment/ Plan: [name] on CKD 4 -  b/l creat from last 3 months in 2023 is 2.4- 2.8, eGFR 16- 19 ml/min. Creat here peaked at 4.64, eGFR 9 ml/min, in setting of SOB, vol overload/ LE edema, low-normal BP's and gen'd weakness. Pt has dementia/ cognitive issues and is not a candidate for HD per her primary renal MD (Dr. Moshe Cipro) and family is aware. UA showed pyuria and renal US showed no obstruction. She is vol overloaded and wasn't responding well until Lasix was increased to 160 tid w/ po metolazone at 74m qd. UOP has improved and edema is better. Would continue to diurese. SCr improving slowly, but BUN starting to climb.  Would change metolazone to every MWF.  Nothing further to add.  Appreciate Palliative care's input.  Will sign off.  Please call with any questions or concerns.  Acute on chronic diastolic CHF - as above EOL - pts renal function may be declining towards esrd. Pt is not a HD candidate. Family will be back in town today. Appreciate palliative care assistance.  Anemia of CKD stage IV - transfuse if Hgb continues to drop. Dementia DNR HTN /vol - vol as above. Have lowered coreg to 12.5 bid for now, holding norvasc. BP's are good.  CADsp CABG PAF  SP PPM Disposition - agree with hospice consult.  JDonetta Potts MD [facility]

## 2022-04-27 NOTE — Progress Notes (Signed)
Physical Therapy Treatment Patient Details Name: [name] MRN: [id number] DOB: 12-Feb-1941 Today's Date: 04/27/2022   History of Present Illness 81 y.o. female with medical history significant of CAD status post CABG, GERD, paroxysmal A-fib, sick sinus syndrome status post pacemaker, anemia, diabetes, neuropathy, hyperlipidemia, hypertension, hypothyroidism, CKD 4, diastolic CHF, depression, GI bleed, s/p L IM nail 03/18/22 and presenting with elevated BUN from SNF.  Pt admitted 04/21/22 for [name] on CKD 4 and acute on chronic diastolic heart failure.  Per nephrology note: "EOL - pts renal function likely is declining towards esrd. Pt is not a HD candidate. Family will be back in town today. Appreciate palliative care assistance."    PT Comments    Pt assisted OOB to recliner.  Pt requiring at least mod assist for mobility.  Pt continually asking about her daughter visiting today (and per RN coming in after work) and reminded of answer each time.    Recommendations for follow up therapy are one component of a multi-disciplinary discharge planning process, led by the attending physician.  Recommendations may be updated based on patient status, additional functional criteria and insurance authorization.  Follow Up Recommendations  Skilled nursing-short term rehab (<3 hours/day) Can patient physically be transported by private vehicle: No   Assistance Recommended at Discharge Frequent or constant Supervision/Assistance  Patient can return home with the following A lot of help with walking and/or transfers;A lot of help with bathing/dressing/bathroom   Equipment Recommendations  None recommended by PT    Recommendations for Other Services       Precautions / Restrictions Precautions Precautions: Fall     Mobility  Bed Mobility Overal bed mobility: Needs Assistance Bed Mobility: Supine to Sit     Supine to sit: Mod assist, HOB elevated     General bed mobility comments: assist for  Lt LE and trunk upright, utilized bed pad to scoot to EOB    Transfers Overall transfer level: Needs assistance Equipment used: Rolling walker (2 wheels) Transfers: Sit to/from Stand Sit to Stand: Mod assist, +2 physical assistance Stand pivot transfers: Mod assist, +2 physical assistance         General transfer comment: multimodal cues for technique and instruction; assit for rise and steady as well as weight shifting to take a few steps over to recliner    Ambulation/Gait                   Stairs             Wheelchair Mobility    Modified Rankin (Stroke Patients Only)       Balance                                            Cognition Arousal/Alertness: Awake/alert Behavior During Therapy: WFL for tasks assessed/performed Overall Cognitive Status: History of cognitive impairments - at baseline                                 General Comments: hx dementia, HOH however appropriate and follows simple commands. Can sound/mumble weird stuff when just waking up per daughter        Exercises      General Comments        Pertinent Vitals/Pain Pain Assessment Pain Assessment: No/denies pain    Home Living  Prior Function            PT Goals (current goals can now be found in the care plan section) Progress towards PT goals: Progressing toward goals    Frequency    Min 2X/week      PT Plan Current plan remains appropriate    Co-evaluation              AM-PAC PT "6 Clicks" Mobility   Outcome Measure  Help needed turning from your back to your side while in a flat bed without using bedrails?: A Lot Help needed moving from lying on your back to sitting on the side of a flat bed without using bedrails?: A Lot Help needed moving to and from a bed to a chair (including a wheelchair)?: A Lot Help needed standing up from a chair using your arms (e.g., wheelchair or  bedside chair)?: Total Help needed to walk in hospital room?: Total Help needed climbing 3-5 steps with a railing? : Total 6 Click Score: 9    End of Session Equipment Utilized During Treatment: Gait belt Activity Tolerance: Patient limited by fatigue Patient left: in chair;with call bell/phone within reach;with chair alarm set Nurse Communication: Mobility status PT Visit Diagnosis: Muscle weakness (generalized) (M62.81);Unsteadiness on feet (R26.81)     Time: 4142-3953 PT Time Calculation (min) (ACUTE ONLY): 14 min  Charges:  $Therapeutic Activity: 8-22 mins                    Jannette Spanner PT, DPT Physical Therapist Acute Rehabilitation Services Preferred contact method: Secure Chat Weekend Pager Only: [phone] Office: [place] 04/27/2022, 1:29 PM

## 2022-04-27 NOTE — Plan of Care (Signed)
  Problem: Education: Goal: Ability to verbalize understanding of medication therapies will improve Outcome: Progressing   Problem: Clinical Measurements: Goal: Ability to maintain clinical measurements within normal limits will improve Outcome: Progressing   Problem: Safety: Goal: Ability to remain free from injury will improve Outcome: Progressing

## 2022-04-28 DIAGNOSIS — N179 Acute kidney failure, unspecified: Secondary | ICD-10-CM | POA: Diagnosis not present

## 2022-04-28 DIAGNOSIS — N184 Chronic kidney disease, stage 4 (severe): Secondary | ICD-10-CM | POA: Diagnosis not present

## 2022-04-28 LAB — CBC WITH DIFFERENTIAL/PLATELET
Abs Immature Granulocytes: 0.08 K/uL — ABNORMAL HIGH (ref 0.00–0.07)
Basophils Absolute: 0 K/uL (ref 0.0–0.1)
Basophils Relative: 0 %
Eosinophils Absolute: 0.3 K/uL (ref 0.0–0.5)
Eosinophils Relative: 4 %
HCT: 22.5 % — ABNORMAL LOW (ref 36.0–46.0)
Hemoglobin: 7.5 g/dL — ABNORMAL LOW (ref 12.0–15.0)
Immature Granulocytes: 1 %
Lymphocytes Relative: 13 %
Lymphs Abs: 1 K/uL (ref 0.7–4.0)
MCH: 32.6 pg (ref 26.0–34.0)
MCHC: 33.3 g/dL (ref 30.0–36.0)
MCV: 97.8 fL (ref 80.0–100.0)
Monocytes Absolute: 0.8 K/uL (ref 0.1–1.0)
Monocytes Relative: 11 %
Neutro Abs: 5.3 K/uL (ref 1.7–7.7)
Neutrophils Relative %: 71 %
Platelets: 240 K/uL (ref 150–400)
RBC: 2.3 MIL/uL — ABNORMAL LOW (ref 3.87–5.11)
RDW: 17.2 % — ABNORMAL HIGH (ref 11.5–15.5)
WBC: 7.4 K/uL (ref 4.0–10.5)
nRBC: 0 % (ref 0.0–0.2)

## 2022-04-28 LAB — COMPREHENSIVE METABOLIC PANEL WITH GFR
ALT: 22 U/L (ref 0–44)
AST: 32 U/L (ref 15–41)
Albumin: 2.7 g/dL — ABNORMAL LOW (ref 3.5–5.0)
Alkaline Phosphatase: 149 U/L — ABNORMAL HIGH (ref 38–126)
Anion gap: 13 (ref 5–15)
BUN: 119 mg/dL — ABNORMAL HIGH (ref 8–23)
CO2: 29 mmol/L (ref 22–32)
Calcium: 8.7 mg/dL — ABNORMAL LOW (ref 8.9–10.3)
Chloride: 93 mmol/L — ABNORMAL LOW (ref 98–111)
Creatinine, Ser: 3.65 mg/dL — ABNORMAL HIGH (ref 0.44–1.00)
GFR, Estimated: 12 mL/min — ABNORMAL LOW
Glucose, Bld: 191 mg/dL — ABNORMAL HIGH (ref 70–99)
Potassium: 3.5 mmol/L (ref 3.5–5.1)
Sodium: 135 mmol/L (ref 135–145)
Total Bilirubin: 0.7 mg/dL (ref 0.3–1.2)
Total Protein: 5.9 g/dL — ABNORMAL LOW (ref 6.5–8.1)

## 2022-04-28 LAB — GLUCOSE, CAPILLARY
Glucose-Capillary: 177 mg/dL — ABNORMAL HIGH (ref 70–99)
Glucose-Capillary: 216 mg/dL — ABNORMAL HIGH (ref 70–99)
Glucose-Capillary: 248 mg/dL — ABNORMAL HIGH (ref 70–99)
Glucose-Capillary: 228 mg/dL — ABNORMAL HIGH (ref 70–99)

## 2022-04-28 LAB — MAGNESIUM: Magnesium: 2.2 mg/dL (ref 1.7–2.4)

## 2022-04-28 LAB — PHOSPHORUS: Phosphorus: 5.4 mg/dL — ABNORMAL HIGH (ref 2.5–4.6)

## 2022-04-28 MED ORDER — RENA-VITE PO TABS
1.0000 | ORAL_TABLET | Freq: Every day | ORAL | Status: DC
  Administered 2022-04-28 – 2022-04-30 (×2): 1 via ORAL
  Filled 2022-04-28 (×4): qty 1

## 2022-04-28 NOTE — Progress Notes (Signed)
   WE have received referral on this pt for hospice care. The daughter [name] reports to [place] that she is interested in hospice care but is unable to take her mother home at this time. Therefore, she is wanting her to go back to [facility] for hospice care/rehab for a week or so to get things set in place place at home then take her home with hospice care from there. The pt has been approved for hospice care at both [place] facility or at home.   Daughter is aware that if she goes back to [facility] to use skilled days then she will only be eligible for hospice care after rehab has been completed.   We are following for disposition.  Webb Silversmith RN [phone]

## 2022-04-28 NOTE — TOC Progression Note (Addendum)
Transition of Care [facility]) - Progression Note    Patient Details  Name: [name] MRN: [id number] Date of Birth: 1941-02-14  Transition of Care [facility]) CM/SW Contact  Leeroy Cha, RN Phone Number: 04/28/2022, 9:09 AM  Clinical Narrative:    Spoke with daughter wants mother to go back to [facility] in [place] has not decided about the hospice aspect yet. Auth for [facility] in [place] started with [employer] for poss dc [id number]. Tracey hardin at [facility] notified of possible return tomorrow. Expected Discharge Plan: [place] Barriers to Discharge: Continued Medical Work up  Expected Discharge Plan and Services Expected Discharge Plan: [place]   Discharge Planning Services: CM Consult   Living arrangements for the past 2 months: [place]                                       Social Determinants of Health (SDOH) Interventions    Readmission Risk Interventions    02/11/2022    4:50 PM  Readmission Risk Prevention Plan  Transportation Screening Complete  PCP or Specialist Appt within 3-5 Days Complete  HRI or [place] Complete  Social Work Consult for [place] Planning/Counseling Complete  Palliative Care Screening Not Applicable  Medication Review Press photographer) Complete

## 2022-04-28 NOTE — Progress Notes (Signed)
Nutrition Follow-up  DOCUMENTATION CODES:   Non-severe (moderate) malnutrition in context of chronic illness  INTERVENTION:  - continue Boost Breeze TID; renal-friendly oral nutrition supplement.  - will change daily multivitamin to rena-vite, a renal-friendly multivitamin.   - if patient unable to d/c in the next 24-48 hours, encourage family to bring in foods and beverages patient enjoys.    NUTRITION DIAGNOSIS:   Moderate Malnutrition related to chronic illness (CKD, CHF) as evidenced by energy intake < or equal to 75% for > or equal to 1 month, mild fat depletion, mild muscle depletion. -ongoing  GOAL:   Patient will meet greater than or equal to 90% of their needs -unmet on average  MONITOR:   PO intake, Supplement acceptance, Labs, Weight trends  ASSESSMENT:   Pt admitted from SNF with abnormal labs and elevated BUN, found to have acute renal failure superimposed on CKD stage 4. PMH significant for CAD s/p CABG, GERD, paroxysmal afib, SSS s/p PPM, anemia, diabetes, neuropathy, HLD, HTN, hypothyroidism, CKD 4, diastolic CHF, depression and GIB.  Patient sitting up in the chair with no visitors present at the time of RD visit. Patient shares that she has a poor appetite, nothing sounds appealing to her. She shares that she does not have a big appetite at baseline.   She reports that she typically eats best at breakfast and appetite diminishes throughout the day. Patient shares that she is very ready to go home and is waiting for her family to pick her up.  Review of order indicates she has accepted Boost Breeze 75% of the time offered. She   Nephrology and Palliative Care notes reviewed.   Weight has been fairly stable throughout hospitalization. Non-pitting edema to LLE and mild pitting edema to RLE documented in the edema section of flow sheet.    Labs reviewed; CBGs: 177 and 216 mg/dl, Cl: 93 mmol/l, BUN: 119 mg/dl, creatinine: 3.65 mg/dl, Ca: 8.7 mg/dl, Phos: 5.4  mg/dl, K and Mg WDL, GFR: 12 ml/min.  Medications reviewed; 160 mg IV lasix TID started 7/3, sliding scale novolog, 100 mcg oral synthroid/day, 40 mg oral protonix/day.   Diet Order:   Diet Order             Diet Carb Modified Fluid consistency: Thin; Room service appropriate? Yes; Fluid restriction: 1200 mL Fluid  Diet effective now                   EDUCATION NEEDS:   Education needs have been addressed  Skin:  Skin Assessment: Reviewed RN Assessment  Last BM:  6/30 (type 5, medium amount)  Height:   Ht Readings from Last 1 Encounters:  04/21/22 5' (1.524 m)    Weight:   Wt Readings from Last 1 Encounters:  04/28/22 59.3 kg     BMI:  Body mass index is 25.53 kg/m.  Estimated Nutritional Needs:  Kcal:  1400-1600 Protein:  70-85g Fluid:  >/=1.4L     Jarome Matin, MS, RD, LDN, CNSC Registered Dietitian II Inpatient Clinical Nutrition RD pager # and on-call/weekend pager # available in [place]

## 2022-04-28 NOTE — Progress Notes (Signed)
PROGRESS NOTE  [name] AJG:[id number] DOB: 02-Oct-1941 DOA: 04/21/2022 PCP: Leeroy Cha, MD   LOS: 6 days   Brief Narrative / Interim history: 81 year old female with history of CAD and CABG, PAF, SSS status post pacemaker, dementia, HTN, hypothyroidism, CKD 4, who comes into the hospital with elevated BUN from her SNF.  She was found to have acute kidney injury and appears to be volume overloaded.  Nephrology consulted and she was started on IV diuresis.  Subjective / 24h Interval events: Eating breakfast, has no complaints.  She is pleasant, alert, no chest pain, no shortness of breath.  Assesement and Plan: Principal Problem:   Acute renal failure superimposed on stage 4 chronic kidney disease (HCC) Active Problems:   GERD   Insulin dependent type 2 diabetes mellitus ([place])   Hyperlipidemia associated with type 2 diabetes mellitus (HCC)   S/P CABG (coronary artery bypass graft), 12/04/11   Hypertension associated with diabetes ([place])   Hypothyroidism   S/P placement of cardiac pacemaker   SSS (sick sinus syndrome) (HCC)   Paroxysmal atrial fibrillation ([place]) - not on systemic anticoagulation due to hx of GI bleeding   Coronary artery disease   Status post coronary artery stent placement   Anemia of chronic disease   Acute on chronic diastolic CHF (congestive heart failure) ([place])   Principal problem Acute kidney injury on chronic kidney disease stage IV-baseline creatinine appears to be around 2.5-3.  During this admission creatinine was up to 4.6.  Nephrology consulted, she is not deemed a dialysis candidate due to chronic comorbidities and underlying dementia.  Discussed with Dr. Marval Regal over the phone.  Seems to be responding to IV Lasix 160 mg every 8 hours, continue.  Can potentially transition to torsemide 60 twice daily when ready to go back to [place]  Active problems Acute on chronic diastolic CHF-on IV Lasix, improving  Hypervolemic  hyponatremia-improving with Lasix.  Monitor  Hyperphosphatemia-due to CKD  Hypokalemia-monitor while on Lasix  Hyperlipidemia-continue statin  CAD with history of CABG-no chest pain, continue home medications  Paroxysmal A-fib, sick sinus syndrome status post PPM-paced rhythm noted  Anemia of chronic kidney disease-hemoglobin overall stable   Epistaxis -received Afrin, no bleeding this morning.  She keeps picking her nose given dementia  T2DM with diabetic neuropathy- SSI  CBG (last 3)  Recent Labs    04/27/22 1706 04/27/22 2109 04/28/22 0749  GLUCAP 237* 220* 177*   Hypertension- Holding home amlodipine in the setting of low normal blood pressure, decrease coreg.  Hypothyroid - Continue home Levothyroxine  Depression, Dementia - Continue home sertraline, delirium precautions   GOC -DNR: POA.  She will return to SNF with hospice  Scheduled Meds:  carvedilol  12.5 mg Oral BID WC   Chlorhexidine Gluconate Cloth  6 each Topical Daily   feeding supplement  1 Container Oral TID BM   insulin aspart  0-9 Units Subcutaneous TID WC   isosorbide mononitrate  30 mg Oral Daily   levothyroxine  100 mcg Oral QHS   metolazone  5 mg Oral Q24H   multivitamin with minerals  1 tablet Oral Daily   oxymetazoline  1 spray Each Nare BID   pantoprazole  40 mg Oral QAC breakfast   rosuvastatin  10 mg Oral QHS   sodium chloride flush  10-40 mL Intracatheter Q12H   sodium chloride flush  3 mL Intravenous Q12H   Continuous Infusions:  furosemide 160 mg (04/28/22 0631)   PRN Meds:.acetaminophen **OR** acetaminophen, albuterol, polyethylene glycol,  polyvinyl alcohol, sodium chloride, sodium chloride flush  Diet Orders (From admission, onward)     Start     Ordered   04/23/22 1550  Diet Carb Modified Fluid consistency: Thin; Room service appropriate? Yes; Fluid restriction: 1200 mL Fluid  Diet effective now       Comments: 2 gram sodium  Question Answer Comment  Diet-HS Snack? Nothing    Calorie Level Medium 1600-2000   Fluid consistency: Thin   Room service appropriate? Yes   Fluid restriction: 1200 mL Fluid      04/23/22 1550            DVT prophylaxis: Place and maintain sequential compression device Start: 04/27/22 2342   Lab Results  Component Value Date   PLT 240 04/28/2022      Code Status: DNR  Family Communication: No family at bedside  Status is: Inpatient  Remains inpatient appropriate because: IV diuresis   Level of care: Telemetry  Consultants:  Nephrology Palliative  Objective: Vitals:   04/27/22 2114 04/28/22 0612 04/28/22 0659 04/28/22 1009  BP: (!) 126/40 132/85  (!) 135/44  Pulse: 60 (!) 58  60  Resp: '16 15  16  '$ Temp: 98.4 F (36.9 C) 98.6 F (37 C)  98.7 F (37.1 C)  TempSrc: Oral Oral  Oral  SpO2: 99% 99%  99%  Weight:   59.3 kg   Height:        Intake/Output Summary (Last 24 hours) at 04/28/2022 1056 Last data filed at 04/28/2022 0859 Gross per 24 hour  Intake 1020.26 ml  Output 1300 ml  Net -279.74 ml   Wt Readings from Last 3 Encounters:  04/28/22 59.3 kg  03/18/22 57.8 kg  02/12/22 57.4 kg    Examination:  Constitutional: NAD Eyes: no scleral icterus ENMT: Mucous membranes are moist.  Neck: normal, supple Respiratory: clear to auscultation bilaterally, no wheezing, no crackles.  Diminished at the bases Cardiovascular: Regular rate and rhythm, no murmurs / rubs / gallops.  Trace edema Abdomen: non distended, no tenderness. Bowel sounds positive.  Musculoskeletal: no clubbing / cyanosis.  Skin: no rashes Neurologic: non focal  Data Reviewed: I have independently reviewed following labs and imaging studies   CBC Recent Labs  Lab 04/24/22 0450 04/25/22 0830 04/26/22 0333 04/27/22 0616 04/28/22 0414  WBC 6.1 5.7 5.9 6.5 7.4  HGB 7.5* 7.6* 7.3* 7.7* 7.5*  HCT 23.0* 23.2* 22.2* 23.5* 22.5*  PLT 178 198 201 220 240  MCV 100.4* 99.1 98.2 98.7 97.8  MCH 32.8 32.5 32.3 32.4 32.6  MCHC 32.6 32.8  32.9 32.8 33.3  RDW 17.2* 17.1* 17.1* 17.2* 17.2*  LYMPHSABS 0.7 0.6* 0.8 0.8 1.0  MONOABS 0.5 0.5 0.6 0.8 0.8  EOSABS 0.1 0.1 0.2 0.3 0.3  BASOSABS 0.0 0.0 0.0 0.0 0.0    Recent Labs  Lab 04/21/22 1641 04/21/22 1821 04/22/22 0029 04/23/22 0423 04/24/22 0450 04/25/22 0830 04/26/22 0333 04/27/22 0616 04/28/22 0414  NA  --   --  132*   < > 133* 134* 131* 134*  135 135  K  --   --  4.7   < > 4.4 3.8 3.5 3.4*  3.4* 3.5  CL  --   --  98   < > 101 99 96* 95*  95* 93*  CO2  --   --  20*   < > 19* 20* 20* '26  26 29  '$ GLUCOSE  --   --  165*   < > 142* 157* 151*  147*  148* 191*  BUN  --   --  105*   < > 98* 119* 107* 114*  119* 119*  CREATININE  --   --  4.53*   < > 4.64* 4.32* 4.21* 3.90*  4.02* 3.65*  CALCIUM  --   --  7.8*   < > 8.4* 8.3* 8.3* 8.8*  8.9 8.7*  AST  --   --  42*  --  32 28  --  31 32  ALT  --   --  26  --  23 23  --  23 22  ALKPHOS  --   --  219*  --  216* 195*  --  163* 149*  BILITOT  --   --  0.7  --  0.9 0.8  --  0.7 0.7  ALBUMIN  --   --  2.9*  --  2.9* 2.9* 2.7* 2.9*  2.9* 2.7*  MG   < >  --  2.1  --  2.3 2.2 2.1 2.1 2.2  BNP  --  1,241.0*  --   --   --   --   --   --   --    < > = values in this interval not displayed.    ------------------------------------------------------------------------------------------------------------------ No results for input(s): "CHOL", "HDL", "LDLCALC", "TRIG", "CHOLHDL", "LDLDIRECT" in the last 72 hours.  Lab Results  Component Value Date   HGBA1C 6.6 (H) 02/11/2022   ------------------------------------------------------------------------------------------------------------------ No results for input(s): "TSH", "T4TOTAL", "T3FREE", "THYROIDAB" in the last 72 hours.  Invalid input(s): "FREET3"  Cardiac Enzymes No results for input(s): "CKMB", "TROPONINI", "MYOGLOBIN" in the last 168 hours.  Invalid input(s):  "CK" ------------------------------------------------------------------------------------------------------------------    Component Value Date/Time   BNP 1,241.0 (H) 04/21/2022 1821   BNP 75.8 02/06/2013 1634    CBG: Recent Labs  Lab 04/27/22 0814 04/27/22 1154 04/27/22 1706 04/27/22 2109 04/28/22 0749  GLUCAP 180* 172* 237* 220* 177*    Recent Results (from the past 240 hour(s))  MRSA Next Gen by PCR, Nasal     Status: None   Collection Time: 04/22/22  2:02 PM   Specimen: Nasal Mucosa; Nasal Swab  Result Value Ref Range Status   MRSA by PCR Next Gen NOT DETECTED NOT DETECTED Final    Comment: (NOTE) The GeneXpert MRSA Assay (FDA approved for NASAL specimens only), is one component of a comprehensive MRSA colonization surveillance program. It is not intended to diagnose MRSA infection nor to guide or monitor treatment for MRSA infections. Test performance is not FDA approved in patients less than 50 years old. Performed at [facility], [place] [address], [place], [place] [address]      Radiology Studies: No results found.   Marzetta Board, MD, PhD [facility]  Between 7 am - 7 pm I am available, please contact me via Amion (for emergencies) or Securechat (non urgent messages)  Between 7 pm - 7 am I am not available, please contact night coverage MD/APP via Amion

## 2022-04-28 NOTE — Plan of Care (Signed)
  Problem: Health Behavior/Discharge Planning: Goal: Ability to manage health-related needs will improve Outcome: Progressing   Problem: Clinical Measurements: Goal: Will remain free from infection Outcome: Progressing   Problem: Coping: Goal: Level of anxiety will decrease Outcome: Progressing   Problem: Safety: Goal: Ability to remain free from injury will improve Outcome: Progressing   Problem: Fluid Volume: Goal: Ability to maintain a balanced intake and output will improve Outcome: Progressing

## 2022-04-28 NOTE — Progress Notes (Signed)
Occupational Therapy Treatment Patient Details Name: [name] MRN: [id number] DOB: [birth year] Today's Date: 04/28/2022   History of present illness 81 y.o. female with medical history significant of CAD status post CABG, GERD, paroxysmal A-fib, sick sinus syndrome status post pacemaker, anemia, diabetes, neuropathy, hyperlipidemia, hypertension, hypothyroidism, CKD 4, diastolic CHF, depression, GI bleed, s/p L IM nail 03/18/22 and presenting with elevated BUN from SNF.  Pt admitted 04/21/22 for [name] on CKD 4 and acute on chronic diastolic heart failure.  Per nephrology note: "EOL - pts renal function likely is declining towards esrd. Pt is not a HD candidate. Family will be back in town today. Appreciate palliative care assistance."   OT comments  Pt progressing slow but steady towards acute OT goals. Focus of session was functional transfers and working on OOB activity tolerance. D/c recommendation remains appropriate.    Recommendations for follow up therapy are one component of a multi-disciplinary discharge planning process, led by the attending physician.  Recommendations may be updated based on patient status, additional functional criteria and insurance authorization.    Follow Up Recommendations  Long-term institutional care without follow-up therapy    Assistance Recommended at Discharge Frequent or constant Supervision/Assistance  Patient can return home with the following  A lot of help with walking and/or transfers;A lot of help with bathing/dressing/bathroom;Assistance with cooking/housework;Direct supervision/assist for financial management;Direct supervision/assist for medications management;Help with stairs or ramp for entrance;Assist for transportation   Equipment Recommendations  None recommended by OT    Recommendations for Other Services      Precautions / Restrictions Precautions Precautions: Fall Restrictions Weight Bearing Restrictions: No       Mobility Bed  Mobility Overal bed mobility: Needs Assistance Bed Mobility: Supine to Sit     Supine to sit: Mod assist, HOB elevated     General bed mobility comments: assist to advance LLE>RLE, trunk elevation and to scoot hips to full EOB position.    Transfers Overall transfer level: Needs assistance Equipment used: Rolling walker (2 wheels) Transfers: Sit to/from Stand, Bed to chair/wheelchair/BSC Sit to Stand: Mod assist, +2 physical assistance Stand pivot transfers: Mod assist, +2 physical assistance         General transfer comment: multimodal cues, difficulty sequencing and with weight shifts     Balance Overall balance assessment: Needs assistance Sitting-balance support: No upper extremity supported, Feet supported Sitting balance-Leahy Scale: Fair     Standing balance support: Reliant on assistive device for balance Standing balance-Leahy Scale: Poor                             ADL either performed or assessed with clinical judgement   ADL Overall ADL's : Needs assistance/impaired                         Toilet Transfer: Moderate assistance;+2 for safety/equipment;Stand-pivot Toilet Transfer Details (indicate cue type and reason): EOB to recliner. difficulty sequencing and initiating side steps. assist to steady on weight shift.           General ADL Comments: Pt completed simulated toilet transfer, pivotol steps to access recliner.    Extremity/Trunk Assessment Upper Extremity Assessment Upper Extremity Assessment: Generalized weakness   Lower Extremity Assessment Lower Extremity Assessment: Defer to PT evaluation        Vision       Perception     Praxis      Cognition Arousal/Alertness: Awake/alert  Behavior During Therapy: WFL for tasks assessed/performed Overall Cognitive Status: History of cognitive impairments - at baseline                                 General Comments: hx dementia, HOH however appropriate  and follows simple commands. Can sound/mumble weird stuff when just waking up per daughter        Exercises      Shoulder Instructions       General Comments      Pertinent Vitals/ Pain       Pain Assessment Pain Assessment: No/denies pain  Home Living                                          Prior Functioning/Environment              Frequency  Min 2X/week        Progress Toward Goals  OT Goals(current goals can now be found in the care plan section)  Progress towards OT goals: Progressing toward goals  Acute Rehab OT Goals Patient Stated Goal: to do as much as she can OT Goal Formulation: With family Time For Goal Achievement: 05/09/22 Potential to Achieve Goals: Fair ADL Goals Pt Will Transfer to Toilet: with min assist;bedside commode Pt Will Perform Toileting - Clothing Manipulation and hygiene: with mod assist;sit to/from stand Additional ADL Goal #1: Patient will perform 10 min functional activity or exercise activity as evidence of improving activity tolerance  Plan Discharge plan remains appropriate    Co-evaluation                 AM-PAC OT "6 Clicks" Daily Activity     Outcome Measure   Help from another person eating meals?: A Little Help from another person taking care of personal grooming?: A Little Help from another person toileting, which includes using toliet, bedpan, or urinal?: A Lot Help from another person bathing (including washing, rinsing, drying)?: A Lot Help from another person to put on and taking off regular upper body clothing?: A Lot Help from another person to put on and taking off regular lower body clothing?: Total 6 Click Score: 13    End of Session Equipment Utilized During Treatment: Rolling walker (2 wheels)  OT Visit Diagnosis: Muscle weakness (generalized) (M62.81);Other symptoms and signs involving cognitive function   Activity Tolerance Patient tolerated treatment well;Patient limited  by fatigue   Patient Left in chair;with call bell/phone within reach;with chair alarm set   Nurse Communication          Time: 2778-2423 OT Time Calculation (min): 28 min  Charges: OT General Charges $OT Visit: 1 Visit OT Treatments $Self Care/Home Management : 23-37 mins  Tyrone Schimke, OT Acute Rehabilitation Services Office: [phone]   Hortencia Pilar 04/28/2022, 1:36 PM

## 2022-04-29 DIAGNOSIS — N184 Chronic kidney disease, stage 4 (severe): Secondary | ICD-10-CM | POA: Diagnosis not present

## 2022-04-29 DIAGNOSIS — N179 Acute kidney failure, unspecified: Secondary | ICD-10-CM | POA: Diagnosis not present

## 2022-04-29 LAB — BASIC METABOLIC PANEL
Anion gap: 14 (ref 5–15)
BUN: 113 mg/dL — ABNORMAL HIGH (ref 8–23)
CO2: 32 mmol/L (ref 22–32)
Calcium: 8.8 mg/dL — ABNORMAL LOW (ref 8.9–10.3)
Chloride: 88 mmol/L — ABNORMAL LOW (ref 98–111)
Creatinine, Ser: 3.14 mg/dL — ABNORMAL HIGH (ref 0.44–1.00)
GFR, Estimated: 14 mL/min — ABNORMAL LOW (ref >60)
Glucose, Bld: 211 mg/dL — ABNORMAL HIGH (ref 70–99)
Potassium: 3.1 mmol/L — ABNORMAL LOW (ref 3.5–5.1)
Sodium: 134 mmol/L — ABNORMAL LOW (ref 135–145)

## 2022-04-29 LAB — GLUCOSE, CAPILLARY
Glucose-Capillary: 255 mg/dL — ABNORMAL HIGH (ref 70–99)
Glucose-Capillary: 251 mg/dL — ABNORMAL HIGH (ref 70–99)
Glucose-Capillary: 122 mg/dL — ABNORMAL HIGH (ref 70–99)
Glucose-Capillary: 143 mg/dL — ABNORMAL HIGH (ref 70–99)

## 2022-04-29 MED ORDER — TORSEMIDE 20 MG PO TABS
60.0000 mg | ORAL_TABLET | Freq: Two times a day (BID) | ORAL | Status: DC
  Administered 2022-04-29 – 2022-05-01 (×4): 60 mg via ORAL
  Filled 2022-04-29 (×5): qty 3

## 2022-04-29 MED ORDER — POTASSIUM CHLORIDE CRYS ER 20 MEQ PO TBCR
40.0000 meq | EXTENDED_RELEASE_TABLET | ORAL | Status: AC
  Administered 2022-04-29 (×2): 40 meq via ORAL
  Filled 2022-04-29 (×2): qty 2

## 2022-04-29 MED ORDER — SENNOSIDES-DOCUSATE SODIUM 8.6-50 MG PO TABS
2.0000 | ORAL_TABLET | Freq: Two times a day (BID) | ORAL | Status: DC
  Administered 2022-04-29 – 2022-05-01 (×4): 2 via ORAL
  Filled 2022-04-29 (×5): qty 2

## 2022-04-29 MED ORDER — ONDANSETRON HCL 4 MG/2ML IJ SOLN
4.0000 mg | Freq: Four times a day (QID) | INTRAMUSCULAR | Status: DC | PRN
Start: 2022-04-29 — End: 2022-05-01
  Administered 2022-04-29 – 2022-04-30 (×3): 4 mg via INTRAVENOUS
  Filled 2022-04-29 (×3): qty 2

## 2022-04-29 MED ORDER — MILK AND MOLASSES ENEMA
1.0000 | Freq: Once | RECTAL | Status: DC
Start: 2022-04-29 — End: 2022-04-30
  Filled 2022-04-29: qty 240

## 2022-04-29 NOTE — Progress Notes (Signed)
PROGRESS NOTE  [name] DVV:[id number] DOB: Jan 14, 1941 DOA: 04/21/2022 PCP: Leeroy Cha, MD   LOS: 7 days   Brief Narrative / Interim history: 81 year old female with history of CAD and CABG, PAF, SSS status post pacemaker, dementia, HTN, hypothyroidism, CKD 4, who comes into the hospital with elevated BUN from her SNF.  She was found to have acute kidney injury and appears to be volume overloaded.  Nephrology consulted and she was started on IV diuresis.  Subjective / 24h Interval events: Complains of nausea this morning.   Assesement and Plan: Principal Problem:   Acute renal failure superimposed on stage 4 chronic kidney disease (HCC) Active Problems:   GERD   Insulin dependent type 2 diabetes mellitus ([place])   Hyperlipidemia associated with type 2 diabetes mellitus (HCC)   S/P CABG (coronary artery bypass graft), 12/04/11   Hypertension associated with diabetes ([place])   Hypothyroidism   S/P placement of cardiac pacemaker   SSS (sick sinus syndrome) (HCC)   Paroxysmal atrial fibrillation ([place]) - not on systemic anticoagulation due to hx of GI bleeding   Coronary artery disease   Status post coronary artery stent placement   Anemia of chronic disease   Acute on chronic diastolic CHF (congestive heart failure) ([place])   Principal problem Acute kidney injury on chronic kidney disease stage IV-baseline creatinine appears to be around 2.5-3.  During this admission creatinine was up to 4.6.  Nephrology consulted, she is not deemed a dialysis candidate due to chronic comorbidities and underlying dementia.  Discussed with Dr. Marval Regal over the phone 7/5. Responded well to IV furosemide, euvolemic now. Transition to torsemide today.  Active problems Acute on chronic diastolic CHF-on IV Lasix, improving, euvolemic. Switch to po torsemide  Nausea, constipation - suspect related. Antiemetics, laxatives  Hypervolemic hyponatremia-improving with Lasix.   Monitor  Hyperphosphatemia-due to CKD  Hypokalemia-K 3.1 this morning, replete again. Check in am   Hyperlipidemia-continue statin  CAD with history of CABG-no chest pain, continue home medications  Paroxysmal A-fib, sick sinus syndrome status post PPM-paced rhythm noted  Anemia of chronic kidney disease-hemoglobin overall stable   Epistaxis -received Afrin, no bleeding this morning.  She keeps picking her nose given dementia  T2DM with diabetic neuropathy- SSI  CBG (last 3)  Recent Labs    04/28/22 2130 04/29/22 0742 04/29/22 1138  GLUCAP 228* 255* 251*    Hypertension- Holding home amlodipine in the setting of low normal blood pressure, decrease coreg.  Hypothyroid - Continue home Levothyroxine  Depression, Dementia - Continue home sertraline, delirium precautions   GOC -DNR: POA.  She will return to SNF with hospice  Scheduled Meds:  carvedilol  12.5 mg Oral BID WC   Chlorhexidine Gluconate Cloth  6 each Topical Daily   feeding supplement  1 Container Oral TID BM   insulin aspart  0-9 Units Subcutaneous TID WC   isosorbide mononitrate  30 mg Oral Daily   levothyroxine  100 mcg Oral QHS   metolazone  5 mg Oral Q24H   milk and molasses  1 enema Rectal Once   multivitamin  1 tablet Oral QHS   pantoprazole  40 mg Oral QAC breakfast   rosuvastatin  10 mg Oral QHS   senna-docusate  2 tablet Oral BID   sodium chloride flush  10-40 mL Intracatheter Q12H   sodium chloride flush  3 mL Intravenous Q12H   torsemide  60 mg Oral BID   Continuous Infusions:   PRN Meds:.acetaminophen **OR** acetaminophen, albuterol, ondansetron (ZOFRAN)  IV, polyethylene glycol, polyvinyl alcohol, sodium chloride, sodium chloride flush  Diet Orders (From admission, onward)     Start     Ordered   04/23/22 1550  Diet Carb Modified Fluid consistency: Thin; Room service appropriate? Yes; Fluid restriction: 1200 mL Fluid  Diet effective now       Comments: 2 gram sodium  Question Answer  Comment  Diet-HS Snack? Nothing   Calorie Level Medium 1600-2000   Fluid consistency: Thin   Room service appropriate? Yes   Fluid restriction: 1200 mL Fluid      04/23/22 1550            DVT prophylaxis: Place and maintain sequential compression device Start: 04/27/22 2342   Lab Results  Component Value Date   PLT 240 04/28/2022      Code Status: DNR  Family Communication: No family at bedside  Status is: Inpatient  Remains inpatient appropriate because: IV diuresis   Level of care: Telemetry  Consultants:  Nephrology Palliative  Objective: Vitals:   04/28/22 0659 04/28/22 1009 04/28/22 2137 04/29/22 0605  BP:  (!) 135/44 135/82 (!) 155/59  Pulse:  60 64 61  Resp:  16 18   Temp:  98.7 F (37.1 C) 98.5 F (36.9 C) 98.3 F (36.8 C)  TempSrc:  Oral Oral Oral  SpO2:  99% 100% 100%  Weight: 59.3 kg     Height:        Intake/Output Summary (Last 24 hours) at 04/29/2022 1256 Last data filed at 04/29/2022 0800 Gross per 24 hour  Intake 635.74 ml  Output 3200 ml  Net -2564.26 ml    Wt Readings from Last 3 Encounters:  04/28/22 59.3 kg  03/18/22 57.8 kg  02/12/22 57.4 kg    Examination:  Constitutional: NAD Eyes: lids and conjunctivae normal, no scleral icterus ENMT: mmm Neck: normal, supple Respiratory: clear to auscultation bilaterally, no wheezing, no crackles. Cardiovascular: Regular rate and rhythm, no murmurs / rubs / gallops.  Abdomen: soft, no distention, no tenderness. Bowel sounds positive.  Skin: no rashes Neurologic: no focal deficits, equal strength  Data Reviewed: I have independently reviewed following labs and imaging studies   CBC Recent Labs  Lab 04/24/22 0450 04/25/22 0830 04/26/22 0333 04/27/22 0616 04/28/22 0414  WBC 6.1 5.7 5.9 6.5 7.4  HGB 7.5* 7.6* 7.3* 7.7* 7.5*  HCT 23.0* 23.2* 22.2* 23.5* 22.5*  PLT 178 198 201 220 240  MCV 100.4* 99.1 98.2 98.7 97.8  MCH 32.8 32.5 32.3 32.4 32.6  MCHC 32.6 32.8 32.9 32.8  33.3  RDW 17.2* 17.1* 17.1* 17.2* 17.2*  LYMPHSABS 0.7 0.6* 0.8 0.8 1.0  MONOABS 0.5 0.5 0.6 0.8 0.8  EOSABS 0.1 0.1 0.2 0.3 0.3  BASOSABS 0.0 0.0 0.0 0.0 0.0     Recent Labs  Lab 04/24/22 0450 04/25/22 0830 04/26/22 0333 04/27/22 0616 04/28/22 0414 04/29/22 0431  NA 133* 134* 131* 134*  135 135 134*  K 4.4 3.8 3.5 3.4*  3.4* 3.5 3.1*  CL 101 99 96* 95*  95* 93* 88*  CO2 19* 20* 20* '26  26 29 '$ 32  GLUCOSE 142* 157* 151* 147*  148* 191* 211*  BUN 98* 119* 107* 114*  119* 119* 113*  CREATININE 4.64* 4.32* 4.21* 3.90*  4.02* 3.65* 3.14*  CALCIUM 8.4* 8.3* 8.3* 8.8*  8.9 8.7* 8.8*  AST 32 28  --  31 32  --   ALT 23 23  --  23 22  --   ALKPHOS 216*  195*  --  163* 149*  --   BILITOT 0.9 0.8  --  0.7 0.7  --   ALBUMIN 2.9* 2.9* 2.7* 2.9*  2.9* 2.7*  --   MG 2.3 2.2 2.1 2.1 2.2  --      ------------------------------------------------------------------------------------------------------------------ No results for input(s): "CHOL", "HDL", "LDLCALC", "TRIG", "CHOLHDL", "LDLDIRECT" in the last 72 hours.  Lab Results  Component Value Date   HGBA1C 6.6 (H) 02/11/2022   ------------------------------------------------------------------------------------------------------------------ No results for input(s): "TSH", "T4TOTAL", "T3FREE", "THYROIDAB" in the last 72 hours.  Invalid input(s): "FREET3"  Cardiac Enzymes No results for input(s): "CKMB", "TROPONINI", "MYOGLOBIN" in the last 168 hours.  Invalid input(s): "CK" ------------------------------------------------------------------------------------------------------------------    Component Value Date/Time   BNP 1,241.0 (H) 04/21/2022 1821   BNP 75.8 02/06/2013 1634    CBG: Recent Labs  Lab 04/28/22 1124 04/28/22 1601 04/28/22 2130 04/29/22 0742 04/29/22 1138  GLUCAP 216* 248* 228* 255* 251*     Recent Results (from the past 240 hour(s))  MRSA Next Gen by PCR, Nasal     Status: None   Collection Time:  04/22/22  2:02 PM   Specimen: Nasal Mucosa; Nasal Swab  Result Value Ref Range Status   MRSA by PCR Next Gen NOT DETECTED NOT DETECTED Final    Comment: (NOTE) The GeneXpert MRSA Assay (FDA approved for NASAL specimens only), is one component of a comprehensive MRSA colonization surveillance program. It is not intended to diagnose MRSA infection nor to guide or monitor treatment for MRSA infections. Test performance is not FDA approved in patients less than 58 years old. Performed at [facility], [employer] [address], [place], [place] [address]      Radiology Studies: No results found.   Marzetta Board, MD, PhD [facility]  Between 7 am - 7 pm I am available, please contact me via Amion (for emergencies) or Securechat (non urgent messages)  Between 7 pm - 7 am I am not available, please contact night coverage MD/APP via Amion

## 2022-04-29 NOTE — Care Management Important Message (Signed)
Important Message  Patient Details IM Letter given to the Patient. Name: [name] MRN: [id number] Date of Birth: Dec 09, 1940   Medicare Important Message Given:  Yes     Kerin Salen 04/29/2022, 12:45 PM

## 2022-04-30 ENCOUNTER — Inpatient Hospital Stay (HOSPITAL_COMMUNITY): Payer: Medicare Other

## 2022-04-30 DIAGNOSIS — N179 Acute kidney failure, unspecified: Secondary | ICD-10-CM | POA: Diagnosis not present

## 2022-04-30 DIAGNOSIS — N184 Chronic kidney disease, stage 4 (severe): Secondary | ICD-10-CM | POA: Diagnosis not present

## 2022-04-30 LAB — URINALYSIS, ROUTINE W REFLEX MICROSCOPIC
Bilirubin Urine: NEGATIVE
Glucose, UA: NEGATIVE mg/dL
Ketones, ur: NEGATIVE mg/dL
Nitrite: NEGATIVE
Protein, ur: 100 mg/dL — AB
Specific Gravity, Urine: 1.009 (ref 1.005–1.030)
WBC, UA: >50 WBC/hpf — ABNORMAL HIGH (ref 0–5)
pH: 7 (ref 5.0–8.0)

## 2022-04-30 LAB — BASIC METABOLIC PANEL
Anion gap: 16 — ABNORMAL HIGH (ref 5–15)
BUN: 112 mg/dL — ABNORMAL HIGH (ref 8–23)
CO2: 31 mmol/L (ref 22–32)
Calcium: 9.1 mg/dL (ref 8.9–10.3)
Chloride: 88 mmol/L — ABNORMAL LOW (ref 98–111)
Creatinine, Ser: 3.62 mg/dL — ABNORMAL HIGH (ref 0.44–1.00)
GFR, Estimated: 12 mL/min — ABNORMAL LOW (ref >60)
Glucose, Bld: 145 mg/dL — ABNORMAL HIGH (ref 70–99)
Potassium: 3.8 mmol/L (ref 3.5–5.1)
Sodium: 135 mmol/L (ref 135–145)

## 2022-04-30 LAB — GLUCOSE, CAPILLARY
Glucose-Capillary: 159 mg/dL — ABNORMAL HIGH (ref 70–99)
Glucose-Capillary: 182 mg/dL — ABNORMAL HIGH (ref 70–99)
Glucose-Capillary: 144 mg/dL — ABNORMAL HIGH (ref 70–99)
Glucose-Capillary: 173 mg/dL — ABNORMAL HIGH (ref 70–99)

## 2022-04-30 MED ORDER — CEFDINIR 300 MG PO CAPS
300.0000 mg | ORAL_CAPSULE | Freq: Every day | ORAL | Status: DC
  Administered 2022-04-30 – 2022-05-01 (×2): 300 mg via ORAL
  Filled 2022-04-30 (×2): qty 1

## 2022-04-30 MED ORDER — ONDANSETRON 4 MG PO TBDP
4.0000 mg | ORAL_TABLET | Freq: Two times a day (BID) | ORAL | Status: DC
  Administered 2022-04-30 – 2022-05-01 (×3): 4 mg via ORAL
  Filled 2022-04-30 (×4): qty 1

## 2022-04-30 MED ORDER — CEFDINIR 300 MG PO CAPS: 300.0000 mg | ORAL_CAPSULE | Freq: Two times a day (BID) | ORAL | Status: DC

## 2022-04-30 NOTE — Progress Notes (Signed)
Physical Therapy Treatment Patient Details Name: [name] MRN: [id number] DOB: November 20, 1940 Today's Date: 04/30/2022   History of Present Illness 81 y.o. female with medical history significant of CAD status post CABG, GERD, paroxysmal A-fib, sick sinus syndrome status post pacemaker, anemia, diabetes, neuropathy, hyperlipidemia, hypertension, hypothyroidism, CKD 4, diastolic CHF, depression, GI bleed, s/p L IM nail 03/18/22 and presenting with elevated BUN from SNF.  Pt admitted 04/21/22 for [name] on CKD 4 and acute on chronic diastolic heart failure.  Per nephrology note: "EOL - pts renal function likely is declining towards esrd. Pt is not a HD candidate.    Family would like pt to return to [facility] for ST Rehab    PT Comments    Pt in bed with RN in room as well as Daughter who is an Therapist, sports and Son in [place] who is also an Therapist, sports.  Both assisted Therapist with amb, which was very difficult.   Assisted OOB required increased time and effort.  General bed mobility comments: pt required MAX Asisst to transition from supine to EOB.  Very weak.  Unable to sit EOB on her own.  Poor collapsed posture.  LOB all planes.  C/o dizziness.  BP 143/72, HR 74 and RA 96%.  C/O nausea.  Was pre medicated for.  Excessive belching.  General transfer comment: required Mod Assist + 2 side by side with max encouragement as pt continues to c/o nausea with belching and dizziness. General transfer comment: required Mod Assist + 2 side by side with mac encouragement as pt continues to c/o nausea with belching and dizziness. General Gait Details: Very limited amb distance of 7 feet requiring + 2 side by side assist and third following with recliner.  Poor kyphotic posture.  Short shuffled steps. Profoundly weak. Assisted to recliner and positioned to comfort.   Pt will need to return to SNF for ST Rehab to work on and regain her prior level of transfers and gait.   Recommendations for follow up therapy are one component of a  multi-disciplinary discharge planning process, led by the attending physician.  Recommendations may be updated based on patient status, additional functional criteria and insurance authorization.  Follow Up Recommendations  Skilled nursing-short term rehab (<3 hours/day) Can patient physically be transported by private vehicle: No   Assistance Recommended at Discharge Frequent or constant Supervision/Assistance  Patient can return home with the following A lot of help with walking and/or transfers;A lot of help with bathing/dressing/bathroom   Equipment Recommendations  None recommended by PT    Recommendations for Other Services       Precautions / Restrictions Precautions Precautions: Fall Precaution Comments: recent L IM Nail 5/25/20023 WBAT Restrictions Weight Bearing Restrictions: No     Mobility  Bed Mobility Overal bed mobility: Needs Assistance Bed Mobility: Supine to Sit     Supine to sit: Max assist, +2 for physical assistance, +2 for safety/equipment     General bed mobility comments: pt required MAX Asisst to transition from supine to EOB.  Very weak.  Unable to sit EOB on her own.  Poor collapsed posture.  LOB all planes.  C/o dizziness.  BP 143/72, HR 74 and RA 96%.  C/O nausea.  Was pre medicated for.  Excessive belching/    Transfers Overall transfer level: Needs assistance Equipment used: Rolling walker (2 wheels) Transfers: Sit to/from Stand Sit to Stand: Mod assist, +2 physical assistance, +2 safety/equipment           General  transfer comment: required Mod Assist + 2 side by side with mac encouragement as pt continues to c/o nausea with belching and dizziness.    Ambulation/Gait Ambulation/Gait assistance: Max assist, +2 physical assistance, +2 safety/equipment Gait Distance (Feet): 7 Feet Assistive device: Rolling walker (2 wheels) Gait Pattern/deviations: Step-to pattern, Decreased step length - right, Decreased step length - left, Trunk  flexed, Shuffle Gait velocity: decreased     General Gait Details: Very limited amb distance of 7 feet requiring + 2 side by side assist and third following with recliner.  Poor kyphotic posture.  Short shuffled steps. Profoundly weak.   Stairs             Wheelchair Mobility    Modified Rankin (Stroke Patients Only)       Balance                                            Cognition Arousal/Alertness: Awake/alert Behavior During Therapy: WFL for tasks assessed/performed Overall Cognitive Status: History of cognitive impairments - at baseline                                 General Comments: AxO x 2 pleasant very HOH following repeat functional commands        Exercises      General Comments        Pertinent Vitals/Pain Pain Assessment Pain Assessment: Faces Pain Location: general Pain Descriptors / Indicators: Grimacing Pain Intervention(s): Monitored during session    Home Living                          Prior Function            PT Goals (current goals can now be found in the care plan section) Progress towards PT goals: Progressing toward goals    Frequency    Min 2X/week      PT Plan Current plan remains appropriate    Co-evaluation              AM-PAC PT "6 Clicks" Mobility   Outcome Measure  Help needed turning from your back to your side while in a flat bed without using bedrails?: A Lot Help needed moving from lying on your back to sitting on the side of a flat bed without using bedrails?: A Lot Help needed moving to and from a bed to a chair (including a wheelchair)?: A Lot Help needed standing up from a chair using your arms (e.g., wheelchair or bedside chair)?: A Lot Help needed to walk in hospital room?: A Lot Help needed climbing 3-5 steps with a railing? : Total 6 Click Score: 11    End of Session Equipment Utilized During Treatment: Gait belt Activity Tolerance: Patient  limited by fatigue Patient left: in chair;with call bell/phone within reach;with chair alarm set Nurse Communication: Mobility status PT Visit Diagnosis: Muscle weakness (generalized) (M62.81);Unsteadiness on feet (R26.81)     Time: 5631-4970 PT Time Calculation (min) (ACUTE ONLY): 28 min  Charges:  $Gait Training: 8-22 mins $Therapeutic Activity: 8-22 mins                     Rica Koyanagi  PTA Acute  Rehabilitation Services Office M-F  [phone] Weekend pager [phone]

## 2022-04-30 NOTE — Plan of Care (Signed)
  Problem: Cardiac: Goal: Ability to achieve and maintain adequate cardiopulmonary perfusion will improve Outcome: Progressing   Problem: Clinical Measurements: Goal: Respiratory complications will improve Outcome: Progressing   Problem: Elimination: Goal: Will not experience complications related to bowel motility Outcome: Progressing Goal: Will not experience complications related to urinary retention Outcome: Progressing

## 2022-04-30 NOTE — Progress Notes (Signed)
PROGRESS NOTE  GARIELLE Walker HYQ:[id number] DOB: 05-29-1941 DOA: 04/21/2022 PCP: Leeroy Cha, MD   LOS: 8 days   Brief Narrative / Interim history: 81 year old female with history of CAD and CABG, PAF, SSS status post pacemaker, dementia, HTN, hypothyroidism, CKD 4, who comes into the hospital with elevated BUN from her SNF.  She was found to have acute kidney injury and appears to be volume overloaded.  Nephrology consulted and she was started on IV diuresis.  Subjective / 24h Interval events: Feeling sick this morning, persistent nausea.  Has an emesis bag next to her  Assesement and Plan: Principal Problem:   Acute renal failure superimposed on stage 4 chronic kidney disease ([place]) Active Problems:   GERD   Insulin dependent type 2 diabetes mellitus ([place])   Hyperlipidemia associated with type 2 diabetes mellitus ([facility])   S/P CABG (coronary artery bypass graft), 12/04/11   Hypertension associated with diabetes ([place])   Hypothyroidism   S/P placement of cardiac pacemaker   SSS (sick sinus syndrome) ([facility])   Paroxysmal atrial fibrillation ([place]) - not on systemic anticoagulation due to hx of GI bleeding   Coronary artery disease   Status post coronary artery stent placement   Anemia of chronic disease   Acute on chronic diastolic CHF (congestive heart failure) ([place])   Principal problem Acute kidney injury on chronic kidney disease stage IV-baseline creatinine appears to be around 2.5-3.  During this admission creatinine was up to 4.6.  Nephrology consulted, she is not deemed a dialysis candidate due to chronic comorbidities and underlying dementia.  Discussed with Dr. Marval Regal over the phone 7/5. Responded well to IV furosemide, euvolemic now.  Transition to torsemide.  Creatinine 3.6 as it was couple days ago, appears overall stable.  Repeat in the morning  Active problems Acute on chronic diastolic CHF-initially on IV Lasix, now converted to p.o. torsemide  Nausea,  constipation -obtain an abdominal x-ray today due to persistent nausea and constipation.  Continue Senokot  Hypervolemic hyponatremia-sodium now normalized with Lasix  Hyperphosphatemia-due to CKD  Hypokalemia-potassium normalized this morning with repletion  Hyperlipidemia-continue statin  CAD with history of CABG-no chest pain, continue home medications  Paroxysmal A-fib, sick sinus syndrome status post PPM-paced rhythm noted  Anemia of chronic kidney disease-hemoglobin overall stable   Epistaxis -received Afrin, no bleeding this morning.  Discussed with orthopedic surgery, she will not need Lovenox following today as she has been on Lovenox for 6 weeks.  From her hip replacement  T2DM with diabetic neuropathy- SSI  CBG (last 3)  Recent Labs    04/29/22 1704 04/29/22 2129 04/30/22 0755  GLUCAP 122* 143* 159*    Hypertension- Holding home amlodipine in the setting of low normal blood pressure, decrease coreg.  Hypothyroid - Continue home Levothyroxine  Depression, Dementia - Continue home sertraline, delirium precautions   GOC -DNR: POA.  She will return to SNF with hospice  Scheduled Meds:  carvedilol  12.5 mg Oral BID WC   Chlorhexidine Gluconate Cloth  6 each Topical Daily   feeding supplement  1 Container Oral TID BM   insulin aspart  0-9 Units Subcutaneous TID WC   isosorbide mononitrate  30 mg Oral Daily   levothyroxine  100 mcg Oral QHS   metolazone  5 mg Oral Q24H   multivitamin  1 tablet Oral QHS   ondansetron  4 mg Oral Q12H   pantoprazole  40 mg Oral QAC breakfast   rosuvastatin  10 mg Oral QHS   senna-docusate  2 tablet Oral BID   sodium chloride flush  10-40 mL Intracatheter Q12H   sodium chloride flush  3 mL Intravenous Q12H   torsemide  60 mg Oral BID   Continuous Infusions:   PRN Meds:.acetaminophen **OR** acetaminophen, albuterol, ondansetron (ZOFRAN) IV, polyethylene glycol, polyvinyl alcohol, sodium chloride, sodium chloride flush  Diet  Orders (From admission, onward)     Start     Ordered   04/23/22 1550  Diet Carb Modified Fluid consistency: Thin; Room service appropriate? Yes; Fluid restriction: 1200 mL Fluid  Diet effective now       Comments: 2 gram sodium  Question Answer Comment  Diet-HS Snack? Nothing   Calorie Level Medium 1600-2000   Fluid consistency: Thin   Room service appropriate? Yes   Fluid restriction: 1200 mL Fluid      04/23/22 1550            DVT prophylaxis: Place and maintain sequential compression device Start: 04/27/22 2342   Lab Results  Component Value Date   PLT 240 04/28/2022      Code Status: DNR  Family Communication: No family at bedside  Status is: Inpatient  Remains inpatient appropriate because: IV diuresis   Level of care: Telemetry  Consultants:  Nephrology Palliative  Objective: Vitals:   04/29/22 0605 04/29/22 1505 04/29/22 2139 04/30/22 0631  BP: (!) 155/59 (!) 138/34 (!) 156/45 (!) 126/37  Pulse: 61 63 64 64  Resp:  '18 17 20  '$ Temp: 98.3 F (36.8 C) 98.9 F (37.2 C) 98.9 F (37.2 C) 98.2 F (36.8 C)  TempSrc: Oral Oral Oral Oral  SpO2: 100% 100% 97% 100%  Weight:      Height:        Intake/Output Summary (Last 24 hours) at 04/30/2022 1110 Last data filed at 04/30/2022 0600 Gross per 24 hour  Intake 500 ml  Output 1900 ml  Net -1400 ml    Wt Readings from Last 3 Encounters:  04/28/22 59.3 kg  03/18/22 57.8 kg  02/12/22 57.4 kg    Examination:  Constitutional: NAD Eyes: lids and conjunctivae normal, no scleral icterus ENMT: mmm Neck: normal, supple Respiratory: clear to auscultation bilaterally, no wheezing, no crackles. Normal respiratory effort.  Cardiovascular: Regular rate and rhythm, no murmurs / rubs / gallops. No edema Abdomen: soft, no distention, no tenderness. Bowel sounds positive.  Skin: no rashes Neurologic: no focal deficits, equal strength  Data Reviewed: I have independently reviewed following labs and imaging  studies   CBC Recent Labs  Lab 04/24/22 0450 04/25/22 0830 04/26/22 0333 04/27/22 0616 04/28/22 0414  WBC 6.1 5.7 5.9 6.5 7.4  HGB 7.5* 7.6* 7.3* 7.7* 7.5*  HCT 23.0* 23.2* 22.2* 23.5* 22.5*  PLT 178 198 201 220 240  MCV 100.4* 99.1 98.2 98.7 97.8  MCH 32.8 32.5 32.3 32.4 32.6  MCHC 32.6 32.8 32.9 32.8 33.3  RDW 17.2* 17.1* 17.1* 17.2* 17.2*  LYMPHSABS 0.7 0.6* 0.8 0.8 1.0  MONOABS 0.5 0.5 0.6 0.8 0.8  EOSABS 0.1 0.1 0.2 0.3 0.3  BASOSABS 0.0 0.0 0.0 0.0 0.0     Recent Labs  Lab 04/24/22 0450 04/25/22 0830 04/26/22 0333 04/27/22 0616 04/28/22 0414 04/29/22 0431 04/30/22 0446  NA 133* 134* 131* 134*  135 135 134* 135  K 4.4 3.8 3.5 3.4*  3.4* 3.5 3.1* 3.8  CL 101 99 96* 95*  95* 93* 88* 88*  CO2 19* 20* 20* '26  26 29 '$ 32 31  GLUCOSE 142* 157* 151* 147*  148* 191* 211* 145*  BUN 98* 119* 107* 114*  119* 119* 113* 112*  CREATININE 4.64* 4.32* 4.21* 3.90*  4.02* 3.65* 3.14* 3.62*  CALCIUM 8.4* 8.3* 8.3* 8.8*  8.9 8.7* 8.8* 9.1  AST 32 28  --  31 32  --   --   ALT 23 23  --  23 22  --   --   ALKPHOS 216* 195*  --  163* 149*  --   --   BILITOT 0.9 0.8  --  0.7 0.7  --   --   ALBUMIN 2.9* 2.9* 2.7* 2.9*  2.9* 2.7*  --   --   MG 2.3 2.2 2.1 2.1 2.2  --   --      ------------------------------------------------------------------------------------------------------------------ No results for input(s): "CHOL", "HDL", "LDLCALC", "TRIG", "CHOLHDL", "LDLDIRECT" in the last 72 hours.  Lab Results  Component Value Date   HGBA1C 6.6 (H) 02/11/2022   ------------------------------------------------------------------------------------------------------------------ No results for input(s): "TSH", "T4TOTAL", "T3FREE", "THYROIDAB" in the last 72 hours.  Invalid input(s): "FREET3"  Cardiac Enzymes No results for input(s): "CKMB", "TROPONINI", "MYOGLOBIN" in the last 168 hours.  Invalid input(s):  "CK" ------------------------------------------------------------------------------------------------------------------    Component Value Date/Time   BNP 1,241.0 (H) 04/21/2022 1821   BNP 75.8 02/06/2013 1634    CBG: Recent Labs  Lab 04/29/22 0742 04/29/22 1138 04/29/22 1704 04/29/22 2129 04/30/22 0755  GLUCAP 255* 251* 122* 143* 159*     Recent Results (from the past 240 hour(s))  MRSA Next Gen by PCR, Nasal     Status: None   Collection Time: 04/22/22  2:02 PM   Specimen: Nasal Mucosa; Nasal Swab  Result Value Ref Range Status   MRSA by PCR Next Gen NOT DETECTED NOT DETECTED Final    Comment: (NOTE) The GeneXpert MRSA Assay (FDA approved for NASAL specimens only), is one component of a comprehensive MRSA colonization surveillance program. It is not intended to diagnose MRSA infection nor to guide or monitor treatment for MRSA infections. Test performance is not FDA approved in patients less than 78 years old. Performed at [facility], [place] [address], [place], [place] [address]      Radiology Studies: No results found.   Marzetta Board, MD, PhD [facility]  Between 7 am - 7 pm I am available, please contact me via Amion (for emergencies) or Securechat (non urgent messages)  Between 7 pm - 7 am I am not available, please contact night coverage MD/APP via Amion

## 2022-04-30 NOTE — Plan of Care (Signed)

## 2022-04-30 NOTE — Progress Notes (Signed)
PHARMACY NOTE:  ANTIMICROBIAL RENAL DOSAGE ADJUSTMENT  Current antimicrobial regimen includes a mismatch between antimicrobial dosage and estimated renal function.  As per policy approved by the Pharmacy & Therapeutics and Medical Executive Committees, the antimicrobial dosage will be adjusted accordingly.  Current antimicrobial dosage:  cefdinir 300 mg PO q12h   Indication: UTI  Renal Function:  Estimated Creatinine Clearance: 9.6 mL/min (A) (by C-G formula based on SCr of 3.62 mg/dL (H)). '[]'$      On intermittent HD, scheduled: '[]'$      On CRRT    Antimicrobial dosage has been changed to:  cefdinir 300 mg PO daily   Additional comments:   Royetta Asal, PharmD, BCPS 04/30/2022 11:19 AM

## 2022-05-01 DIAGNOSIS — R1310 Dysphagia, unspecified: Secondary | ICD-10-CM | POA: Diagnosis not present

## 2022-05-01 DIAGNOSIS — S72142D Displaced intertrochanteric fracture of left femur, subsequent encounter for closed fracture with routine healing: Secondary | ICD-10-CM | POA: Diagnosis not present

## 2022-05-01 DIAGNOSIS — Z79899 Other long term (current) drug therapy: Secondary | ICD-10-CM | POA: Diagnosis not present

## 2022-05-01 DIAGNOSIS — E440 Moderate protein-calorie malnutrition: Secondary | ICD-10-CM | POA: Diagnosis not present

## 2022-05-01 DIAGNOSIS — W19XXXD Unspecified fall, subsequent encounter: Secondary | ICD-10-CM | POA: Diagnosis not present

## 2022-05-01 DIAGNOSIS — R531 Weakness: Secondary | ICD-10-CM | POA: Diagnosis not present

## 2022-05-01 DIAGNOSIS — N179 Acute kidney failure, unspecified: Secondary | ICD-10-CM | POA: Diagnosis not present

## 2022-05-01 DIAGNOSIS — I10 Essential (primary) hypertension: Secondary | ICD-10-CM | POA: Diagnosis not present

## 2022-05-01 DIAGNOSIS — E039 Hypothyroidism, unspecified: Secondary | ICD-10-CM | POA: Diagnosis not present

## 2022-05-01 DIAGNOSIS — R2689 Other abnormalities of gait and mobility: Secondary | ICD-10-CM | POA: Diagnosis not present

## 2022-05-01 DIAGNOSIS — D649 Anemia, unspecified: Secondary | ICD-10-CM | POA: Diagnosis not present

## 2022-05-01 DIAGNOSIS — Z7401 Bed confinement status: Secondary | ICD-10-CM | POA: Diagnosis not present

## 2022-05-01 DIAGNOSIS — K219 Gastro-esophageal reflux disease without esophagitis: Secondary | ICD-10-CM | POA: Diagnosis not present

## 2022-05-01 DIAGNOSIS — N390 Urinary tract infection, site not specified: Secondary | ICD-10-CM | POA: Diagnosis not present

## 2022-05-01 DIAGNOSIS — Z794 Long term (current) use of insulin: Secondary | ICD-10-CM | POA: Diagnosis not present

## 2022-05-01 DIAGNOSIS — E785 Hyperlipidemia, unspecified: Secondary | ICD-10-CM | POA: Diagnosis not present

## 2022-05-01 DIAGNOSIS — N184 Chronic kidney disease, stage 4 (severe): Secondary | ICD-10-CM | POA: Diagnosis not present

## 2022-05-01 DIAGNOSIS — M6281 Muscle weakness (generalized): Secondary | ICD-10-CM | POA: Diagnosis not present

## 2022-05-01 DIAGNOSIS — Z950 Presence of cardiac pacemaker: Secondary | ICD-10-CM | POA: Diagnosis not present

## 2022-05-01 DIAGNOSIS — E871 Hypo-osmolality and hyponatremia: Secondary | ICD-10-CM | POA: Diagnosis not present

## 2022-05-01 DIAGNOSIS — I480 Paroxysmal atrial fibrillation: Secondary | ICD-10-CM | POA: Diagnosis not present

## 2022-05-01 DIAGNOSIS — R5381 Other malaise: Secondary | ICD-10-CM | POA: Diagnosis not present

## 2022-05-01 DIAGNOSIS — Z743 Need for continuous supervision: Secondary | ICD-10-CM | POA: Diagnosis not present

## 2022-05-01 DIAGNOSIS — E1165 Type 2 diabetes mellitus with hyperglycemia: Secondary | ICD-10-CM | POA: Diagnosis not present

## 2022-05-01 DIAGNOSIS — R6889 Other general symptoms and signs: Secondary | ICD-10-CM | POA: Diagnosis not present

## 2022-05-01 DIAGNOSIS — I5032 Chronic diastolic (congestive) heart failure: Secondary | ICD-10-CM | POA: Diagnosis not present

## 2022-05-01 DIAGNOSIS — I129 Hypertensive chronic kidney disease with stage 1 through stage 4 chronic kidney disease, or unspecified chronic kidney disease: Secondary | ICD-10-CM | POA: Diagnosis not present

## 2022-05-01 DIAGNOSIS — I509 Heart failure, unspecified: Secondary | ICD-10-CM | POA: Diagnosis not present

## 2022-05-01 DIAGNOSIS — I495 Sick sinus syndrome: Secondary | ICD-10-CM | POA: Diagnosis not present

## 2022-05-01 LAB — BASIC METABOLIC PANEL
Anion gap: 15 (ref 5–15)
BUN: 115 mg/dL — ABNORMAL HIGH (ref 8–23)
CO2: 34 mmol/L — ABNORMAL HIGH (ref 22–32)
Calcium: 9.3 mg/dL (ref 8.9–10.3)
Chloride: 86 mmol/L — ABNORMAL LOW (ref 98–111)
Creatinine, Ser: 3.74 mg/dL — ABNORMAL HIGH (ref 0.44–1.00)
GFR, Estimated: 12 mL/min — ABNORMAL LOW (ref >60)
Glucose, Bld: 202 mg/dL — ABNORMAL HIGH (ref 70–99)
Potassium: 3.4 mmol/L — ABNORMAL LOW (ref 3.5–5.1)
Sodium: 135 mmol/L (ref 135–145)

## 2022-05-01 LAB — GLUCOSE, CAPILLARY
Glucose-Capillary: 190 mg/dL — ABNORMAL HIGH (ref 70–99)
Glucose-Capillary: 250 mg/dL — ABNORMAL HIGH (ref 70–99)

## 2022-05-01 MED ORDER — TORSEMIDE 60 MG PO TABS: 60.0000 mg | ORAL_TABLET | Freq: Two times a day (BID) | ORAL | Status: DC

## 2022-05-01 MED ORDER — CEFDINIR 300 MG PO CAPS: 300.0000 mg | ORAL_CAPSULE | Freq: Every day | ORAL | 0 refills | Status: AC

## 2022-05-01 MED ORDER — OXYCODONE HCL 5 MG PO TABA: 5.0000 mg | ORAL_TABLET | ORAL | 0 refills | Status: DC | PRN

## 2022-05-01 MED ORDER — CARVEDILOL 12.5 MG PO TABS: 12.5000 mg | ORAL_TABLET | Freq: Two times a day (BID) | ORAL | Status: DC

## 2022-05-01 NOTE — Discharge Summary (Signed)
Physician Discharge Summary  [name] VQQ:[id number] DOB: 1941-06-13 DOA: 04/21/2022  PCP: Leeroy Cha, MD  Admit date: 04/21/2022 Discharge date: 05/01/2022  Admitted From: SNF Disposition:  SNF with palliative   Recommendations for Outpatient Follow-up:  Follow up with PCP and nephrology in 1-2 weeks Please obtain BMP/CBC in one week  Home Health: none Equipment/Devices: none  Discharge Condition: stable CODE STATUS: DNR Diet Orders (From admission, onward)     Start     Ordered   04/23/22 1550  Diet Carb Modified Fluid consistency: Thin; Room service appropriate? Yes; Fluid restriction: 1200 mL Fluid  Diet effective now       Comments: 2 gram sodium  Question Answer Comment  Diet-HS Snack? Nothing   Calorie Level Medium 1600-2000   Fluid consistency: Thin   Room service appropriate? Yes   Fluid restriction: 1200 mL Fluid      04/23/22 1550            HPI: Per admitting MD, [name] is a 81 y.o. female with medical history significant of CAD status post CABG, GERD, paroxysmal A-fib, sick sinus syndrome status post pacemaker, anemia, diabetes, neuropathy, hyperlipidemia, hypertension, hypothyroidism, CKD 4, diastolic CHF, depression, GI bleed presenting with abnormal lab value from nursing home. Patient had recent hip fracture repair last month and was discharged to rehab from there.  She presents today with abnormal lab value with elevated BUN.  After her recent admission her home diuretic had been held due to [name] that responded to IV fluids.  Since that time she has had increasing edema unclear exactly when it started.  Did have a DVT study to evaluate for this on the eighth of this month which was negative. Reports ongoing cough. She denies any specific symptoms and at times unsure why she is at the hospital.  She further denies fevers, chills, chest pain, shortness of breath, abdominal pain, constipation, diarrhea, nausea, vomiting. Daughter reports  patient reporting recent eye crusting, monitor for conjunctivitis. Also sounds like she got IVF in the week prior to her presenting and had been starting and stopping her diuretic at the facility.  Hospital Course / Discharge diagnoses: Principal Problem:   Acute renal failure superimposed on stage 4 chronic kidney disease (HCC) Active Problems:   GERD   Insulin dependent type 2 diabetes mellitus ([place])   Hyperlipidemia associated with type 2 diabetes mellitus (HCC)   S/P CABG (coronary artery bypass graft), 12/04/11   Hypertension associated with diabetes ([place])   Hypothyroidism   S/P placement of cardiac pacemaker   SSS (sick sinus syndrome) (HCC)   Paroxysmal atrial fibrillation (HCC) - not on systemic anticoagulation due to hx of GI bleeding   Coronary artery disease   Status post coronary artery stent placement   Anemia of chronic disease   Acute on chronic diastolic CHF (congestive heart failure) ([place])   Principal problem Acute kidney injury on chronic kidney disease stage IV-baseline creatinine appears to be around 2.5-3.  During this admission creatinine was up to 4.6.  Nephrology consulted, she is not deemed a dialysis candidate due to chronic comorbidities and underlying dementia.  Discussed with Dr. Marval Regal over the phone 7/5. Responded well to IV furosemide, euvolemic now.  Transition to torsemide.  Creatinine overall stable, now in the 3 range which may be her new baseline.  Continue to monitor periodically, continue palliative/hospice follow-up   Active problems Acute on chronic diastolic CHF-initially on IV Lasix, now converted to p.o. torsemide Nausea, constipation -continue  bowel regimen, antiemetics PRN.  Treat her UTI UTI-patient had some nausea and a UA was sent.  She has underlying dementia and other symptoms are unclear.  UA did suggest UTI and will treat empirically for 5 days, 4 days remaining on discharge.  Nausea has improved Hypervolemic hyponatremia-sodium now  normalized with Lasix Hyperphosphatemia-due to CKD Hypokalemia-potassium normalized this morning with repletion Hyperlipidemia-continue statin CAD with history of CABG-no chest pain, continue home medications Paroxysmal A-fib, sick sinus syndrome status post PPM-paced rhythm noted Anemia of chronic kidney disease-hemoglobin overall stable Epistaxis -received Afrin, bleeding has now resolved.  Discussed with her orthopedic surgeon, because completed 6 weeks of prophylaxis for her hip fracture, stop Lovenox T2DM with diabetic neuropathy-continue home regimen Hypertension- resume home medications Hypothyroid - Continue home Levothyroxine Depression, Dementia - Continue home sertraline, daughter wishes for this to be stopped, wean off at SNF, delirium precautions  [place] -DNR: POA.  She will return to SNF with hospice  Sepsis ruled out   Discharge Instructions   Allergies as of 05/01/2022       Reactions   Clonidine Derivatives Other (See Comments)   Bradycardia and fatigue   Crestor [rosuvastatin] Other (See Comments)   Made the patient feel tired/weak   Losartan Potassium Other (See Comments)   Hyperkalemia   Sulfa Antibiotics Other (See Comments)   Childhood reaction not recalled   Epinephrine Other (See Comments)   Abnormal feeling. Dental exam/injection of local w/ epi.   Hydralazine Other (See Comments)   Nausea/gi upset   Hydralazine Hcl Anxiety, Other (See Comments)   Nervousness, anxiousness, GI upset        Medication List     STOP taking these medications    amLODipine 5 MG tablet Commonly known as: NORVASC   azithromycin 500 MG tablet Commonly known as: ZITHROMAX   enoxaparin 30 MG/0.3ML injection Commonly known as: LOVENOX   HYDROcodone-acetaminophen 5-325 MG tablet Commonly known as: NORCO/VICODIN   sodium chloride 0.45 % solution       TAKE these medications    acetaminophen 325 MG tablet Commonly known as: TYLENOL Take 975 mg by mouth every 8  (eight) hours as needed for moderate pain.   Allopurinol 200 MG Tabs Take 200 mg by mouth daily.   aspirin EC 81 MG tablet Take 1 tablet (81 mg total) by mouth daily.   bisacodyl 10 MG suppository Commonly known as: DULCOLAX Place 10 mg rectally daily as needed for moderate constipation.   carvedilol 12.5 MG tablet Commonly known as: COREG Take 1 tablet (12.5 mg total) by mouth 2 (two) times daily with a meal. What changed:  medication strength how much to take when to take this   cefdinir 300 MG capsule Commonly known as: OMNICEF Take 1 capsule (300 mg total) by mouth daily for 3 days. Start taking on: May 02, 2022   diclofenac Sodium 1 % Gel Commonly known as: VOLTAREN Apply 2 g topically 2 (two) times daily as needed (apply to hands).   ferrous sulfate 325 (65 FE) MG tablet Take 1 tablet (325 mg total) by mouth 2 (two) times daily with a meal. What changed: when to take this   gabapentin 300 MG capsule Commonly known as: NEURONTIN Take 1 capsule (300 mg total) by mouth at bedtime.   Glucagon Emergency 1 MG Kit Inject 1 mg as directed once as needed (hypoglycemia).   guaiFENesin 600 MG 12 hr tablet Commonly known as: MUCINEX Take 600 mg by mouth 2 (two) times daily.  insulin glargine 100 UNIT/ML injection Commonly known as: Lantus Inject 0.15 mLs (15 Units total) into the skin daily. What changed:  how much to take additional instructions   ipratropium-albuterol 0.5-2.5 (3) MG/3ML Soln Commonly known as: DUONEB Take 3 mLs by nebulization every 6 (six) hours as needed (wheezing).   isosorbide mononitrate 30 MG 24 hr tablet Commonly known as: IMDUR Take 1 tablet (30 mg total) by mouth daily.   Lactulose 20 GM/30ML Soln Take 30 mLs by mouth daily as needed (constipation).   meclizine 25 MG tablet Commonly known as: ANTIVERT Take 25 mg by mouth daily as needed for dizziness.   melatonin 3 MG Tabs tablet Take 6 mg by mouth at bedtime.   Mylanta  Maximum Strength 400-400-40 MG/5ML suspension Generic drug: alum & mag hydroxide-simeth Take 30 mLs by mouth every 4 (four) hours as needed for indigestion.   nitroGLYCERIN 0.4 MG SL tablet Commonly known as: NITROSTAT Place 1 tablet (0.4 mg total) under the tongue every 5 (five) minutes as needed for chest pain (max 3 doses).   NON FORMULARY Take 1 Dose by mouth daily as needed (suppliment).   ondansetron 4 MG tablet Commonly known as: ZOFRAN Take 4 mg by mouth every 4 (four) hours as needed for nausea or vomiting.   OxyCODONE HCl (Abuse Deter) 5 MG Taba Commonly known as: OXAYDO Take 5 mg by mouth every 4 (four) hours as needed (pain).   pantoprazole 40 MG tablet Commonly known as: PROTONIX Take 40 mg by mouth at bedtime.   polyethylene glycol 17 g packet Commonly known as: MIRALAX / GLYCOLAX Take 17 g by mouth daily. What changed:  when to take this reasons to take this   rosuvastatin 10 MG tablet Commonly known as: CRESTOR Take 10 mg by mouth at bedtime.   senna 8.6 MG Tabs tablet Commonly known as: SENOKOT Take 2 tablets by mouth at bedtime.   Synthroid 100 MCG tablet Generic drug: levothyroxine Take 100 mcg by mouth at bedtime.   Torsemide 60 MG Tabs Take 60 mg by mouth 2 (two) times daily.       Consultations: Nephrology   Procedures/Studies:  DG Abd Portable 1V  Result Date: 04/30/2022 CLINICAL DATA:  Nausea EXAM: PORTABLE ABDOMEN - 1 VIEW COMPARISON:  None Available. FINDINGS: Nonobstructive pattern of bowel gas with gas present to the rectum and scattered stool in the colon. No free air on supine radiographs. IMPRESSION: Nonobstructive pattern of bowel gas with gas present to the rectum and scattered stool in the colon. Electronically Signed   By: Delanna Ahmadi M.D.   On: 04/30/2022 12:14   DG CHEST PORT 1 VIEW  Result Date: 04/25/2022 CLINICAL DATA:  81 year old female with renal failure. Abnormal right lung base opacity last month. EXAM: PORTABLE  CHEST 1 VIEW COMPARISON:  Portable chest 04/21/2022 and earlier. FINDINGS: Portable AP semi upright view at 0514 hours. Stable lung volumes with underlying chronic cardiomegaly, prior CABG, left chest cardiac pacemaker. Streaky right infrahilar opacity has regressed since last month but not completely resolved. No pneumothorax. Stable pulmonary vascularity without overt edema. Questionable additional veiling opacity at both lung bases, with small bilateral pleural effusions demonstrated by CT in April. Osteopenia. Chronic proximal left humerus deformity. Paucity of bowel gas in the upper abdomen. IMPRESSION: 1. Regressed but not resolved right lung base opacity seen last month. Suspicion of underlying small pleural effusions, such that this may be atelectasis. 2. Stable cardiomegaly. No new cardiopulmonary abnormality. Electronically Signed   By: Lemmie Evens  [place] Crane M.D.   On: 04/25/2022 08:31   US RENAL  Result Date: 04/22/2022 CLINICAL DATA:  Acute renal failure, stage IV EXAM: RENAL / URINARY TRACT ULTRASOUND COMPLETE COMPARISON:  03/21/2022 and previous FINDINGS: Right Kidney: Renal measurements: 9.6 x 5.1 x 5.3 cm = volume: 134 mL. Parenchyma thin, echogenic compared to the adjacent liver. No hydronephrosis. 0.6 cm exophytic nodule or cyst from the upper pole. Left Kidney: Renal measurements: 8.5 x 4.5 x 3.8 cm = volume: 76 mL. Cortical thinning. No hydronephrosis. 1.8 cm probable cyst exophytic from the midpole. Bladder: Appears normal for degree of bladder distention. Other: None. IMPRESSION: 1. No hydronephrosis. 2. Bilateral renal parenchymal atrophy with echogenic parenchyma, a nonspecific indicator of medical renal disease. Electronically Signed   By: Lucrezia Europe M.D.   On: 04/22/2022 16:03   DG Chest 1 View  Result Date: 04/21/2022 CLINICAL DATA:  Provided history: Abnormal labs. EXAM: CHEST  1 VIEW COMPARISON:  Prior chest radiographs 02/10/2022 and earlier. FINDINGS: Left chest dual lead implantable cardiac  device. Prior median sternotomy/CABG. Mild cardiomegaly. Aortic atherosclerosis. Ill-defined opacity within the medial right lung base. No appreciable pulmonary edema. No evidence of pleural effusion or pneumothorax. No acute bony abnormality identified. Chronic deformity of the proximal left humerus. IMPRESSION: Opacity within the medial right lung base suspicious for pneumonia. Followup PA and lateral chest radiographs are recommended in 3-4 weeks following trial of antibiotic therapy to ensure resolution and exclude underlying malignancy. Cardiomegaly. Aortic Atherosclerosis (ICD10-I70.0). Electronically Signed   By: Kellie Simmering D.O.   On: 04/21/2022 18:51   VAS [place] LOWER EXTREMITY VENOUS (DVT)  Result Date: 04/01/2022  Lower Venous DVT Study Patient Name:  ALYANNAH SANKS  Date of Exam:   04/01/2022 Medical Rec #: [id number]        Accession #:    [id number] Date of Birth: [birth year]         Patient Gender: F Patient Age:   81 years Exam Location:  [facility] Procedure:      VAS [place] LOWER EXTREMITY VENOUS (DVT) Referring Phys: Georgeanna Harrison --------------------------------------------------------------------------------  Indications: Edema, Pain, and 2 weeks s/p left hip replacement.  Limitations: Poor ultrasound/tissue interface. Comparison Study: No prior study Performing Technologist: Maudry Mayhew MHA, RDMS, RVT, RDCS  Examination Guidelines: A complete evaluation includes B-mode imaging, spectral Doppler, color Doppler, and power Doppler as needed of all accessible portions of each vessel. Bilateral testing is considered an integral part of a complete examination. Limited examinations for reoccurring indications may be performed as noted. The reflux portion of the exam is performed with the patient in reverse Trendelenburg.  +-----+---------------+---------+-----------+----------+--------------+ RIGHTCompressibilityPhasicitySpontaneityPropertiesThrombus Aging  +-----+---------------+---------+-----------+----------+--------------+ CFV  Full           Yes      Yes                                 +-----+---------------+---------+-----------+----------+--------------+   +---------+---------------+---------+-----------+----------+--------------+ LEFT     CompressibilityPhasicitySpontaneityPropertiesThrombus Aging +---------+---------------+---------+-----------+----------+--------------+ CFV      Full           Yes      Yes                                 +---------+---------------+---------+-----------+----------+--------------+ SFJ      Full                                                        +---------+---------------+---------+-----------+----------+--------------+  FV Prox  Full                                                        +---------+---------------+---------+-----------+----------+--------------+ FV Mid   Full                                                        +---------+---------------+---------+-----------+----------+--------------+ FV DistalFull                                                        +---------+---------------+---------+-----------+----------+--------------+ PFV      Full                                                        +---------+---------------+---------+-----------+----------+--------------+ POP      Full           Yes      Yes                                 +---------+---------------+---------+-----------+----------+--------------+ PTV      Full                                                        +---------+---------------+---------+-----------+----------+--------------+ PERO     Full                                                        +---------+---------------+---------+-----------+----------+--------------+     Summary: RIGHT: - No evidence of common femoral vein obstruction.  LEFT: - There is no evidence of deep vein thrombosis in the  lower extremity.  - No cystic structure found in the popliteal fossa.  *See table(s) above for measurements and observations. Electronically signed by Deitra Mayo MD on 04/01/2022 at 3:30:14 PM.    Final      Subjective: - no chest pain, shortness of breath, no abdominal pain, nausea or vomiting.   Discharge Exam: BP (!) 164/53 (BP Location: Right Arm)   Pulse 61   Temp 99.1 F (37.3 C) (Oral)   Resp 18   Ht 5' (1.524 m)   Wt 59.3 kg   SpO2 98%   BMI 25.53 kg/m   General: Pt is alert, awake, not in acute distress Cardiovascular: RRR, S1/S2 +, no rubs, no gallops Respiratory: CTA bilaterally, no wheezing, no rhonchi  The results of significant diagnostics from this hospitalization (including imaging, microbiology, ancillary and laboratory) are listed below for reference.  Microbiology: Recent Results (from the past 240 hour(s))  MRSA Next Gen by PCR, Nasal     Status: None   Collection Time: 04/22/22  2:02 PM   Specimen: Nasal Mucosa; Nasal Swab  Result Value Ref Range Status   MRSA by PCR Next Gen NOT DETECTED NOT DETECTED Final    Comment: (NOTE) The GeneXpert MRSA Assay (FDA approved for NASAL specimens only), is one component of a comprehensive MRSA colonization surveillance program. It is not intended to diagnose MRSA infection nor to guide or monitor treatment for MRSA infections. Test performance is not FDA approved in patients less than 57 years old. Performed at [facility], [place] [address] [place], [place] [address]      Labs: Basic Metabolic Panel: Recent Labs  Lab 04/25/22 0830 04/26/22 0333 04/27/22 0616 04/28/22 0414 04/29/22 0431 04/30/22 0446 05/01/22 0939  NA 134* 131* 134*  135 135 134* 135 135  K 3.8 3.5 3.4*  3.4* 3.5 3.1* 3.8 3.4*  CL 99 96* 95*  95* 93* 88* 88* 86*  CO2 20* 20* _0 32 31 34*  GLUCOSE 157* 151* 147*  148* 191* 211* 145* 202*  BUN 119* 107* 114*  119* 119* 113* 112* PENDING   CREATININE 4.32* 4.21* 3.90*  4.02* 3.65* 3.14* 3.62* 3.74*  CALCIUM 8.3* 8.3* 8.8*  8.9 8.7* 8.8* 9.1 9.3  MG 2.2 2.1 2.1 2.2  --   --   --   PHOS 7.0* 6.3*  6.4* 6.0*  6.0* 5.4*  --   --   --    Liver Function Tests: Recent Labs  Lab 04/25/22 0830 04/26/22 0333 04/27/22 0616 04/28/22 0414  AST 28  --  31 32  ALT 23  --  23 22  ALKPHOS 195*  --  163* 149*  BILITOT 0.8  --  0.7 0.7  PROT 6.0*  --  6.1* 5.9*  ALBUMIN 2.9* 2.7* 2.9*  2.9* 2.7*   CBC: Recent Labs  Lab 04/25/22 0830 04/26/22 0333 04/27/22 0616 04/28/22 0414  WBC 5.7 5.9 6.5 7.4  NEUTROABS 4.4 4.1 4.5 5.3  HGB 7.6* 7.3* 7.7* 7.5*  HCT 23.2* 22.2* 23.5* 22.5*  MCV 99.1 98.2 98.7 97.8  PLT 198 201 220 240   CBG: Recent Labs  Lab 04/30/22 0755 04/30/22 1141 04/30/22 1726 04/30/22 2144 05/01/22 0736  GLUCAP 159* 182* 144* 173* 190*   Hgb A1c No results for input(s): "HGBA1C" in the last 72 hours. Lipid Profile No results for input(s): "CHOL", "HDL", "LDLCALC", "TRIG", "CHOLHDL", "LDLDIRECT" in the last 72 hours. Thyroid function studies No results for input(s): "TSH", "T4TOTAL", "T3FREE", "THYROIDAB" in the last 72 hours.  Invalid input(s): "FREET3" Urinalysis    Component Value Date/Time   COLORURINE YELLOW 04/30/2022 0953   APPEARANCEUR HAZY (A) 04/30/2022 0953   LABSPEC 1.009 04/30/2022 0953   PHURINE 7.0 04/30/2022 0953   GLUCOSEU NEGATIVE 04/30/2022 0953   HGBUR MODERATE (A) 04/30/2022 0953   BILIRUBINUR NEGATIVE 04/30/2022 0953   KETONESUR NEGATIVE 04/30/2022 0953   PROTEINUR 100 (A) 04/30/2022 0953   UROBILINOGEN 0.2 01/04/2012 1105   NITRITE NEGATIVE 04/30/2022 0953   LEUKOCYTESUR LARGE (A) 04/30/2022 0953    FURTHER DISCHARGE INSTRUCTIONS:   Get Medicines reviewed and adjusted: Please take all your medications with you for your next visit with your Primary MD   Laboratory/radiological data: Please request your Primary MD to go over all hospital tests and  procedure/radiological results at the follow up, please ask your  Primary MD to get all Hospital records sent to his/her office.   In some cases, they will be blood work, cultures and biopsy results pending at the time of your discharge. Please request that your primary care M.D. goes through all the records of your hospital data and follows up on these results.   Also Note the following: If you experience worsening of your admission symptoms, develop shortness of breath, life threatening emergency, suicidal or homicidal thoughts you must seek medical attention immediately by calling 911 or calling your MD immediately  if symptoms less severe.   You must read complete instructions/literature along with all the possible adverse reactions/side effects for all the Medicines you take and that have been prescribed to you. Take any new Medicines after you have completely understood and accpet all the possible adverse reactions/side effects.    Do not drive when taking Pain medications or sleeping medications (Benzodaizepines)   Do not take more than prescribed Pain, Sleep and Anxiety Medications. It is not advisable to combine anxiety,sleep and pain medications without talking with your primary care practitioner   Special Instructions: If you have smoked or chewed Tobacco  in the last 2 yrs please stop smoking, stop any regular Alcohol  and or any Recreational drug use.   Wear Seat belts while driving.   Please note: You were cared for by a hospitalist during your hospital stay. Once you are discharged, your primary care physician will handle any further medical issues. Please note that NO REFILLS for any discharge medications will be authorized once you are discharged, as it is imperative that you return to your primary care physician (or establish a relationship with a primary care physician if you do not have one) for your post hospital discharge needs so that they can reassess your need for  medications and monitor your lab values.  Time coordinating discharge: 35 minutes  SIGNED:  Marzetta Board, MD, PhD 05/01/2022, 11:01 AM

## 2022-05-01 NOTE — TOC Progression Note (Addendum)
Transition of Care [facility]) - Progression Note    Patient Details  Name: SHAMEKA AGGARWAL MRN: [id number] Date of Birth: [birth year]  [facility] CM/SW Contact  Leeroy Cha, RN Phone Number: 05/01/2022, 11:00 AM  Clinical Narrative:    Tct-alice the daughter/ family is on the way to make a decision as to whether to take home or send to [facility]. Per the daughter patient can go to [facility] just not with hospce.  [facility] Ace notified of pending transport. Ptar called at 1150. Transport packet to the unit clerk. Rn made aware. Expected Discharge Plan: [place] Barriers to Discharge: Continued Medical Work up  Expected Discharge Plan and Services Expected Discharge Plan: [place]   Discharge Planning Services: CM Consult   Living arrangements for the past 2 months: [place]                                       Social Determinants of Health (SDOH) Interventions    Readmission Risk Interventions    02/11/2022    4:50 PM  Readmission Risk Prevention Plan  Transportation Screening Complete  PCP or Specialist Appt within 3-5 Days Complete  HRI or [place] Complete  Social Work Consult for [place] Planning/Counseling Complete  Palliative Care Screening Not Applicable  Medication Review [employer]) Complete

## 2022-05-03 DIAGNOSIS — I5032 Chronic diastolic (congestive) heart failure: Secondary | ICD-10-CM | POA: Diagnosis not present

## 2022-05-03 DIAGNOSIS — N179 Acute kidney failure, unspecified: Secondary | ICD-10-CM | POA: Diagnosis not present

## 2022-05-03 DIAGNOSIS — N390 Urinary tract infection, site not specified: Secondary | ICD-10-CM | POA: Diagnosis not present

## 2022-05-03 DIAGNOSIS — R5381 Other malaise: Secondary | ICD-10-CM | POA: Diagnosis not present

## 2022-05-04 LAB — URINE CULTURE: Culture: >=100000 — AB

## 2022-05-19 ENCOUNTER — Ambulatory Visit (INDEPENDENT_AMBULATORY_CARE_PROVIDER_SITE_OTHER)

## 2022-05-19 DIAGNOSIS — I495 Sick sinus syndrome: Secondary | ICD-10-CM | POA: Diagnosis not present

## 2022-05-20 LAB — CUP PACEART REMOTE DEVICE CHECK
Battery Remaining Longevity: 55 mo
Battery Remaining Percentage: 46 %
Battery Voltage: 2.98 V
Brady Statistic AP VP Percent: 1 %
Brady Statistic AP VS Percent: 99 %
Brady Statistic AS VP Percent: 1 %
Brady Statistic AS VS Percent: 1 %
Brady Statistic RA Percent Paced: 99 %
Brady Statistic RV Percent Paced: 1 %
Date Time Interrogation Session: 20230726233111
Implantable Lead Implant Date: 20171019
Implantable Lead Implant Date: 20171019
Implantable Lead Location: 753859
Implantable Lead Location: 753860
Implantable Pulse Generator Implant Date: 20171019
Lead Channel Impedance Value: 430 Ohm
Lead Channel Impedance Value: 850 Ohm
Lead Channel Pacing Threshold Amplitude: 0.5 V
Lead Channel Pacing Threshold Amplitude: 0.75 V
Lead Channel Pacing Threshold Pulse Width: 0.4 ms
Lead Channel Pacing Threshold Pulse Width: 0.4 ms
Lead Channel Sensing Intrinsic Amplitude: 12 mV
Lead Channel Sensing Intrinsic Amplitude: 5 mV
Lead Channel Setting Pacing Amplitude: 2 V
Lead Channel Setting Pacing Amplitude: 2.5 V
Lead Channel Setting Pacing Pulse Width: 0.4 ms
Lead Channel Setting Sensing Sensitivity: 2 mV
Pulse Gen Model: 2272
Pulse Gen Serial Number: 3180458

## 2022-06-11 NOTE — Progress Notes (Signed)
Remote pacemaker transmission.   

## 2022-06-29 ENCOUNTER — Telehealth: Payer: Self-pay

## 2022-06-29 NOTE — Patient Outreach (Signed)
  Care Coordination   06/29/2022 Name: [name] MRN: [id number] DOB: August 16, 1941   Care Coordination Outreach Attempts:  An unsuccessful telephone outreach was attempted today to offer the patient information about available care coordination services as a benefit of their health plan.   Follow Up Plan:  Additional outreach attempts will be made to offer the patient care coordination information and services.   Encounter Outcome:  No Answer  Care Coordination Interventions Activated:  No   Care Coordination Interventions:  No, not indicated    [place] [facility] [phone]

## 2022-07-16 ENCOUNTER — Telehealth: Payer: Self-pay

## 2022-07-16 NOTE — Patient Outreach (Signed)
  Care Coordination   07/16/2022 Name: [name] MRN: [id number] DOB: 06-17-1941   Care Coordination Outreach Attempts:  A second unsuccessful outreach was attempted today to offer the patient with information about available care coordination services as a benefit of their health plan.     Follow Up Plan:  Additional outreach attempts will be made to offer the patient care coordination information and services.   Encounter Outcome:  No Answer  Care Coordination Interventions Activated:  No   Care Coordination Interventions:  No, not indicated    [place] [facility] [phone]

## 2022-08-04 DIAGNOSIS — H9209 Otalgia, unspecified ear: Secondary | ICD-10-CM | POA: Diagnosis not present

## 2022-08-18 ENCOUNTER — Ambulatory Visit (INDEPENDENT_AMBULATORY_CARE_PROVIDER_SITE_OTHER)

## 2022-08-18 DIAGNOSIS — I495 Sick sinus syndrome: Secondary | ICD-10-CM

## 2022-08-19 LAB — CUP PACEART REMOTE DEVICE CHECK
Battery Remaining Longevity: 52 mo
Battery Remaining Percentage: 44 %
Battery Voltage: 2.96 V
Brady Statistic AP VP Percent: 1 %
Brady Statistic AP VS Percent: 99 %
Brady Statistic AS VP Percent: 1 %
Brady Statistic AS VS Percent: 1.3 %
Brady Statistic RA Percent Paced: 98 %
Brady Statistic RV Percent Paced: 1 %
Date Time Interrogation Session: 20231025101432
Implantable Lead Connection Status: 753985
Implantable Lead Connection Status: 753985
Implantable Lead Implant Date: 20171019
Implantable Lead Implant Date: 20171019
Implantable Lead Location: 753859
Implantable Lead Location: 753860
Implantable Pulse Generator Implant Date: 20171019
Lead Channel Impedance Value: 390 Ohm
Lead Channel Impedance Value: 830 Ohm
Lead Channel Pacing Threshold Amplitude: 0.5 V
Lead Channel Pacing Threshold Amplitude: 0.75 V
Lead Channel Pacing Threshold Pulse Width: 0.4 ms
Lead Channel Pacing Threshold Pulse Width: 0.4 ms
Lead Channel Sensing Intrinsic Amplitude: 12 mV
Lead Channel Sensing Intrinsic Amplitude: 5 mV
Lead Channel Setting Pacing Amplitude: 2 V
Lead Channel Setting Pacing Amplitude: 2.5 V
Lead Channel Setting Pacing Pulse Width: 0.4 ms
Lead Channel Setting Sensing Sensitivity: 2 mV
Pulse Gen Model: 2272
Pulse Gen Serial Number: 3180458

## 2022-08-31 NOTE — Progress Notes (Signed)
Remote pacemaker transmission.   

## 2022-09-01 DIAGNOSIS — R300 Dysuria: Secondary | ICD-10-CM | POA: Diagnosis not present

## 2022-11-17 ENCOUNTER — Ambulatory Visit: Payer: Medicare Other | Attending: Cardiology

## 2022-11-17 DIAGNOSIS — I495 Sick sinus syndrome: Secondary | ICD-10-CM

## 2022-11-18 LAB — CUP PACEART REMOTE DEVICE CHECK
Battery Remaining Longevity: 48 mo
Battery Remaining Percentage: 42 %
Battery Voltage: 2.96 V
Brady Statistic AP VP Percent: 1 %
Brady Statistic AP VS Percent: 98 %
Brady Statistic AS VP Percent: 1 %
Brady Statistic AS VS Percent: 1.3 %
Brady Statistic RA Percent Paced: 98 %
Brady Statistic RV Percent Paced: 1 %
Date Time Interrogation Session: 20240125015742
Implantable Lead Connection Status: 753985
Implantable Lead Connection Status: 753985
Implantable Lead Implant Date: 20171019
Implantable Lead Implant Date: 20171019
Implantable Lead Location: 753859
Implantable Lead Location: 753860
Implantable Pulse Generator Implant Date: 20171019
Lead Channel Impedance Value: 390 Ohm
Lead Channel Impedance Value: 810 Ohm
Lead Channel Pacing Threshold Amplitude: 0.5 V
Lead Channel Pacing Threshold Amplitude: 0.75 V
Lead Channel Pacing Threshold Pulse Width: 0.4 ms
Lead Channel Pacing Threshold Pulse Width: 0.4 ms
Lead Channel Sensing Intrinsic Amplitude: 12 mV
Lead Channel Sensing Intrinsic Amplitude: 2.2 mV
Lead Channel Setting Pacing Amplitude: 2 V
Lead Channel Setting Pacing Amplitude: 2.5 V
Lead Channel Setting Pacing Pulse Width: 0.4 ms
Lead Channel Setting Sensing Sensitivity: 2 mV
Pulse Gen Model: 2272
Pulse Gen Serial Number: 3180458

## 2022-12-08 NOTE — Progress Notes (Signed)
Remote pacemaker transmission.   

## 2023-02-16 ENCOUNTER — Ambulatory Visit (INDEPENDENT_AMBULATORY_CARE_PROVIDER_SITE_OTHER)

## 2023-02-16 DIAGNOSIS — I495 Sick sinus syndrome: Secondary | ICD-10-CM | POA: Diagnosis not present

## 2023-02-16 LAB — CUP PACEART REMOTE DEVICE CHECK
Battery Remaining Longevity: 46 mo
Battery Remaining Percentage: 39 %
Battery Voltage: 2.96 V
Brady Statistic AP VP Percent: 1 %
Brady Statistic AP VS Percent: 99 %
Brady Statistic AS VP Percent: 1 %
Brady Statistic AS VS Percent: 1.2 %
Brady Statistic RA Percent Paced: 98 %
Brady Statistic RV Percent Paced: 1 %
Date Time Interrogation Session: 20240424033846
Implantable Lead Connection Status: 753985
Implantable Lead Connection Status: 753985
Implantable Lead Implant Date: 20171019
Implantable Lead Implant Date: 20171019
Implantable Lead Location: 753859
Implantable Lead Location: 753860
Implantable Pulse Generator Implant Date: 20171019
Lead Channel Impedance Value: 390 Ohm
Lead Channel Impedance Value: 780 Ohm
Lead Channel Pacing Threshold Amplitude: 0.5 V
Lead Channel Pacing Threshold Amplitude: 0.75 V
Lead Channel Pacing Threshold Pulse Width: 0.4 ms
Lead Channel Pacing Threshold Pulse Width: 0.4 ms
Lead Channel Sensing Intrinsic Amplitude: 12 mV
Lead Channel Sensing Intrinsic Amplitude: 3.7 mV
Lead Channel Setting Pacing Amplitude: 2 V
Lead Channel Setting Pacing Amplitude: 2.5 V
Lead Channel Setting Pacing Pulse Width: 0.4 ms
Lead Channel Setting Sensing Sensitivity: 2 mV
Pulse Gen Model: 2272
Pulse Gen Serial Number: 3180458

## 2023-03-15 NOTE — Progress Notes (Signed)
Remote pacemaker transmission.   

## 2023-05-18 ENCOUNTER — Ambulatory Visit

## 2023-05-18 DIAGNOSIS — I495 Sick sinus syndrome: Secondary | ICD-10-CM | POA: Diagnosis not present

## 2023-05-19 LAB — CUP PACEART REMOTE DEVICE CHECK
Battery Remaining Longevity: 42 mo
Battery Remaining Percentage: 37 %
Battery Voltage: 2.96 V
Brady Statistic AP VP Percent: 1 %
Brady Statistic AP VS Percent: 98 %
Brady Statistic AS VP Percent: 1 %
Brady Statistic AS VS Percent: 1.2 %
Brady Statistic RA Percent Paced: 98 %
Brady Statistic RV Percent Paced: 1 %
Date Time Interrogation Session: 20240725002652
Implantable Lead Connection Status: 753985
Implantable Lead Connection Status: 753985
Implantable Lead Implant Date: 20171019
Implantable Lead Implant Date: 20171019
Implantable Lead Location: 753859
Implantable Lead Location: 753860
Implantable Pulse Generator Implant Date: 20171019
Lead Channel Impedance Value: 380 Ohm
Lead Channel Impedance Value: 790 Ohm
Lead Channel Pacing Threshold Amplitude: 0.5 V
Lead Channel Pacing Threshold Amplitude: 0.75 V
Lead Channel Pacing Threshold Pulse Width: 0.4 ms
Lead Channel Pacing Threshold Pulse Width: 0.4 ms
Lead Channel Sensing Intrinsic Amplitude: 12 mV
Lead Channel Sensing Intrinsic Amplitude: 2.7 mV
Lead Channel Setting Pacing Amplitude: 2 V
Lead Channel Setting Pacing Amplitude: 2.5 V
Lead Channel Setting Pacing Pulse Width: 0.4 ms
Lead Channel Setting Sensing Sensitivity: 2 mV
Pulse Gen Model: 2272
Pulse Gen Serial Number: 3180458

## 2023-05-23 ENCOUNTER — Ambulatory Visit: Payer: Medicare Other | Attending: Cardiology | Admitting: Cardiology

## 2023-05-23 ENCOUNTER — Encounter: Payer: Self-pay | Admitting: Cardiology

## 2023-05-23 VITALS — BP 100/62 | HR 62 | Ht 60.0 in | Wt 119.0 lb

## 2023-05-23 DIAGNOSIS — I484 Atypical atrial flutter: Secondary | ICD-10-CM

## 2023-05-23 DIAGNOSIS — D6869 Other thrombophilia: Secondary | ICD-10-CM | POA: Diagnosis not present

## 2023-05-23 DIAGNOSIS — I495 Sick sinus syndrome: Secondary | ICD-10-CM | POA: Diagnosis not present

## 2023-05-23 DIAGNOSIS — I480 Paroxysmal atrial fibrillation: Secondary | ICD-10-CM | POA: Diagnosis not present

## 2023-05-23 LAB — CUP PACEART INCLINIC DEVICE CHECK
Battery Remaining Longevity: 44 mo
Battery Voltage: 2.96 V
Brady Statistic RA Percent Paced: 98 %
Brady Statistic RV Percent Paced: 0.25 %
Date Time Interrogation Session: 20240729152802
Implantable Lead Connection Status: 753985
Implantable Lead Connection Status: 753985
Implantable Lead Implant Date: 20171019
Implantable Lead Implant Date: 20171019
Implantable Lead Location: 753859
Implantable Lead Location: 753860
Implantable Pulse Generator Implant Date: 20171019
Lead Channel Impedance Value: 400 Ohm
Lead Channel Impedance Value: 862.5 Ohm
Lead Channel Pacing Threshold Amplitude: 0.5 V
Lead Channel Pacing Threshold Amplitude: 0.5 V
Lead Channel Pacing Threshold Amplitude: 0.75 V
Lead Channel Pacing Threshold Amplitude: 0.75 V
Lead Channel Pacing Threshold Pulse Width: 0.4 ms
Lead Channel Pacing Threshold Pulse Width: 0.4 ms
Lead Channel Pacing Threshold Pulse Width: 0.4 ms
Lead Channel Pacing Threshold Pulse Width: 0.4 ms
Lead Channel Sensing Intrinsic Amplitude: 12 mV
Lead Channel Sensing Intrinsic Amplitude: 2.4 mV
Lead Channel Setting Pacing Amplitude: 2 V
Lead Channel Setting Pacing Amplitude: 2.5 V
Lead Channel Setting Pacing Pulse Width: 0.4 ms
Lead Channel Setting Sensing Sensitivity: 2 mV
Pulse Gen Model: 2272
Pulse Gen Serial Number: 3180458

## 2023-05-23 MED ORDER — METOPROLOL SUCCINATE ER 100 MG PO TB24: 100.0000 mg | ORAL_TABLET | Freq: Every day | ORAL | 6 refills | Status: DC

## 2023-05-23 NOTE — Progress Notes (Signed)
Electrophysiology Office Note:   Date:  05/23/2023  ID:  [name], DOB 1941-06-24, MRN [id number]  Primary Cardiologist: Chrystie Nose, MD Electrophysiologist: Regan Lemming, MD      History of Present Illness:   [name] is a 82 y.o. female with h/o typical atrial flutter, sick sinus syndrome post pacemaker, coronary artery disease seen today for routine electrophysiology followup.  Since last being seen in our clinic the patient reports doing well.  Last year, she fell and broke her hip.  She had trouble with rehab and developed renal failure.  She is entered into hospice care.  Since being discharged from the hospital and starting hospice care she has continued to do well.  Her daughter feels that she may be discharged from hospice he has had tachycardia.  She had a few episodes that caused her to have chest discomfort.  Episodes of lasted up to 30 minutes with heart rates in the 170s.  she denies chest pain, palpitations, dyspnea, PND, orthopnea, nausea, vomiting, dizziness, syncope, edema, weight gain, or early satiety.   Review of systems complete and found to be negative unless listed in HPI.      EP Information / Studies Reviewed:    EKG is ordered today. Personal review as below.  EKG Interpretation Date/Time:  Monday May 23 2023 14:44:41 EDT Ventricular Rate:  62 PR Interval:  118 QRS Duration:  96 QT Interval:  446 QTC Calculation: 452 R Axis:   -22  Text Interpretation: Atrial-paced rhythm Septal infarct (cited on or before 21-Apr-2022) Abnormal ECG When compared with ECG of 21-Apr-2022 19:23, No significant change since last tracing Confirmed by [name] ([id number]) on 05/23/2023 3:11:02 PM   PPM Interrogation-  reviewed in detail today,  See PACEART report.  Device History: Abbott Single Chamber PPM implanted 08/12/16 for Sinus Node Dysfunction  Risk Assessment/Calculations:    CHA2DS2-VASc Score = 6   This indicates a 9.7% annual risk of  stroke. The patient's score is based upon: CHF History: 0 HTN History: 1 Diabetes History: 1 Stroke History: 0 Vascular Disease History: 1 Age Score: 2 Gender Score: 1             Physical Exam:   VS:  BP 100/62   Pulse 62   Ht 5' (1.524 m)   Wt 119 lb (54 kg)   SpO2 92%   BMI 23.24 kg/m    Wt Readings from Last 3 Encounters:  05/23/23 119 lb (54 kg)  04/28/22 130 lb 11.7 oz (59.3 kg)  03/18/22 127 lb 6.8 oz (57.8 kg)     GEN: Well nourished, well developed in no acute distress NECK: No JVD; No carotid bruits CARDIAC: Regular rate and rhythm, no murmurs, rubs, gallops RESPIRATORY:  Clear to auscultation without rales, wheezing or rhonchi  ABDOMEN: Soft, non-tender, non-distended EXTREMITIES:  No edema; No deformity   ASSESSMENT AND PLAN:    SND s/p Abbott PPM  Normal PPM function See Pace Art report No changes today  2.  Atypical atrial flutter: Post cardioversion in 2018 with recurrence.  Currently on carvedilol.  Has continued to have episodes of a one-to-one tachycardia with short runs of winky block.  She has been taking her carvedilol on a daily basis.  [name] stop carvedilol and start Toprol-XL 100 mg.  3.  Hypertension: Currently well-controlled  4.  Coronary artery disease: Status post CABG.  No current chest pain.  Disposition:   Follow up with Dr. Elberta Fortis in 6 months  Signed, [name] Voong Jorja Loa, MD

## 2023-05-23 NOTE — Patient Instructions (Addendum)
Medication Instructions:  Your physician recommends that you continue on your current medications as Your physician has recommended you make the following change in your medication:  STOP Carvedilol START Metoprolol Succinate (Toprol) 100 mg once daily at bedtime  *If you need a refill on your cardiac medications before your next appointment, please call your pharmacy*   Lab Work: None ordered   Testing/Procedures: None ordered   Follow-Up: At [facility], you and your health needs are our priority.  As part of our continuing mission to provide you with exceptional heart care, we have created designated Provider Care Teams.  These Care Teams include your primary Cardiologist (physician) and Advanced Practice Providers (APPs -  Physician Assistants and Nurse Practitioners) who all work together to provide you with the care you need, when you need it.  Remote monitoring is used to monitor your Pacemaker or ICD from home. This monitoring reduces the number of office visits required to check your device to one time per year. It allows [place] to keep an eye on the functioning of your device to ensure it is working properly. You are scheduled for a device check from home on 08/17/2023. You may send your transmission at any time that day. If you have a wireless device, the transmission will be sent automatically. After your physician reviews your transmission, you will receive a postcard with your next transmission date.  Your next appointment:   6 month(s)  The format for your next appointment:   In Person  Provider:   Loman Brooklyn, MD    Thank you for choosing [facility]!!   Dory Horn, RN [phone]

## 2023-05-31 NOTE — Progress Notes (Signed)
Remote pacemaker transmission.   

## 2023-07-26 DIAGNOSIS — I482 Chronic atrial fibrillation, unspecified: Secondary | ICD-10-CM | POA: Diagnosis not present

## 2023-07-26 DIAGNOSIS — E1140 Type 2 diabetes mellitus with diabetic neuropathy, unspecified: Secondary | ICD-10-CM | POA: Diagnosis not present

## 2023-07-26 DIAGNOSIS — R54 Age-related physical debility: Secondary | ICD-10-CM | POA: Diagnosis not present

## 2023-07-26 DIAGNOSIS — N186 End stage renal disease: Secondary | ICD-10-CM | POA: Diagnosis not present

## 2023-07-27 DIAGNOSIS — I482 Chronic atrial fibrillation, unspecified: Secondary | ICD-10-CM | POA: Diagnosis not present

## 2023-07-27 DIAGNOSIS — D692 Other nonthrombocytopenic purpura: Secondary | ICD-10-CM | POA: Diagnosis not present

## 2023-07-27 DIAGNOSIS — N186 End stage renal disease: Secondary | ICD-10-CM | POA: Diagnosis not present

## 2023-07-27 DIAGNOSIS — E1140 Type 2 diabetes mellitus with diabetic neuropathy, unspecified: Secondary | ICD-10-CM | POA: Diagnosis not present

## 2023-08-17 ENCOUNTER — Ambulatory Visit (INDEPENDENT_AMBULATORY_CARE_PROVIDER_SITE_OTHER): Payer: Medicare Other

## 2023-08-17 DIAGNOSIS — I495 Sick sinus syndrome: Secondary | ICD-10-CM | POA: Diagnosis not present

## 2023-08-18 LAB — CUP PACEART REMOTE DEVICE CHECK
Battery Remaining Longevity: 40 mo
Battery Remaining Percentage: 34 %
Battery Voltage: 2.95 V
Brady Statistic AP VP Percent: 1 %
Brady Statistic AP VS Percent: 99 %
Brady Statistic AS VP Percent: 1 %
Brady Statistic AS VS Percent: 1 %
Brady Statistic RA Percent Paced: 98 %
Brady Statistic RV Percent Paced: 1 %
Date Time Interrogation Session: 20241023020015
Implantable Lead Connection Status: 753985
Implantable Lead Connection Status: 753985
Implantable Lead Implant Date: 20171019
Implantable Lead Implant Date: 20171019
Implantable Lead Location: 753859
Implantable Lead Location: 753860
Implantable Pulse Generator Implant Date: 20171019
Lead Channel Impedance Value: 400 Ohm
Lead Channel Impedance Value: 800 Ohm
Lead Channel Pacing Threshold Amplitude: 0.5 V
Lead Channel Pacing Threshold Amplitude: 0.75 V
Lead Channel Pacing Threshold Pulse Width: 0.4 ms
Lead Channel Pacing Threshold Pulse Width: 0.4 ms
Lead Channel Sensing Intrinsic Amplitude: 12 mV
Lead Channel Sensing Intrinsic Amplitude: 2.6 mV
Lead Channel Setting Pacing Amplitude: 2 V
Lead Channel Setting Pacing Amplitude: 2.5 V
Lead Channel Setting Pacing Pulse Width: 0.4 ms
Lead Channel Setting Sensing Sensitivity: 2 mV
Pulse Gen Model: 2272
Pulse Gen Serial Number: 3180458

## 2023-09-05 NOTE — Progress Notes (Signed)
Remote pacemaker transmission.   

## 2023-09-06 DIAGNOSIS — H6120 Impacted cerumen, unspecified ear: Secondary | ICD-10-CM | POA: Diagnosis not present

## 2023-09-07 DIAGNOSIS — R54 Age-related physical debility: Secondary | ICD-10-CM | POA: Diagnosis not present

## 2023-09-07 DIAGNOSIS — N186 End stage renal disease: Secondary | ICD-10-CM | POA: Diagnosis not present

## 2023-09-07 DIAGNOSIS — K5900 Constipation, unspecified: Secondary | ICD-10-CM | POA: Diagnosis not present

## 2023-09-08 DIAGNOSIS — I132 Hypertensive heart and chronic kidney disease with heart failure and with stage 5 chronic kidney disease, or end stage renal disease: Secondary | ICD-10-CM | POA: Diagnosis not present

## 2023-09-08 DIAGNOSIS — N390 Urinary tract infection, site not specified: Secondary | ICD-10-CM | POA: Diagnosis not present

## 2023-09-26 DIAGNOSIS — R1312 Dysphagia, oropharyngeal phase: Secondary | ICD-10-CM | POA: Diagnosis not present

## 2023-09-26 DIAGNOSIS — M6259 Muscle wasting and atrophy, not elsewhere classified, multiple sites: Secondary | ICD-10-CM | POA: Diagnosis not present

## 2023-09-27 DIAGNOSIS — M6259 Muscle wasting and atrophy, not elsewhere classified, multiple sites: Secondary | ICD-10-CM | POA: Diagnosis not present

## 2023-09-27 DIAGNOSIS — R1312 Dysphagia, oropharyngeal phase: Secondary | ICD-10-CM | POA: Diagnosis not present

## 2023-09-28 DIAGNOSIS — R1312 Dysphagia, oropharyngeal phase: Secondary | ICD-10-CM | POA: Diagnosis not present

## 2023-09-28 DIAGNOSIS — M6259 Muscle wasting and atrophy, not elsewhere classified, multiple sites: Secondary | ICD-10-CM | POA: Diagnosis not present

## 2023-09-29 DIAGNOSIS — M6259 Muscle wasting and atrophy, not elsewhere classified, multiple sites: Secondary | ICD-10-CM | POA: Diagnosis not present

## 2023-09-29 DIAGNOSIS — R1312 Dysphagia, oropharyngeal phase: Secondary | ICD-10-CM | POA: Diagnosis not present

## 2023-09-30 DIAGNOSIS — R1312 Dysphagia, oropharyngeal phase: Secondary | ICD-10-CM | POA: Diagnosis not present

## 2023-09-30 DIAGNOSIS — M6259 Muscle wasting and atrophy, not elsewhere classified, multiple sites: Secondary | ICD-10-CM | POA: Diagnosis not present

## 2023-10-03 DIAGNOSIS — R1312 Dysphagia, oropharyngeal phase: Secondary | ICD-10-CM | POA: Diagnosis not present

## 2023-10-03 DIAGNOSIS — M6259 Muscle wasting and atrophy, not elsewhere classified, multiple sites: Secondary | ICD-10-CM | POA: Diagnosis not present

## 2023-10-04 DIAGNOSIS — R1312 Dysphagia, oropharyngeal phase: Secondary | ICD-10-CM | POA: Diagnosis not present

## 2023-10-04 DIAGNOSIS — M6259 Muscle wasting and atrophy, not elsewhere classified, multiple sites: Secondary | ICD-10-CM | POA: Diagnosis not present

## 2023-10-05 DIAGNOSIS — R1312 Dysphagia, oropharyngeal phase: Secondary | ICD-10-CM | POA: Diagnosis not present

## 2023-10-05 DIAGNOSIS — M6259 Muscle wasting and atrophy, not elsewhere classified, multiple sites: Secondary | ICD-10-CM | POA: Diagnosis not present

## 2023-10-06 DIAGNOSIS — M6259 Muscle wasting and atrophy, not elsewhere classified, multiple sites: Secondary | ICD-10-CM | POA: Diagnosis not present

## 2023-10-06 DIAGNOSIS — R1312 Dysphagia, oropharyngeal phase: Secondary | ICD-10-CM | POA: Diagnosis not present

## 2023-10-07 DIAGNOSIS — R1312 Dysphagia, oropharyngeal phase: Secondary | ICD-10-CM | POA: Diagnosis not present

## 2023-10-07 DIAGNOSIS — M6259 Muscle wasting and atrophy, not elsewhere classified, multiple sites: Secondary | ICD-10-CM | POA: Diagnosis not present

## 2023-10-08 DIAGNOSIS — R1312 Dysphagia, oropharyngeal phase: Secondary | ICD-10-CM | POA: Diagnosis not present

## 2023-10-08 DIAGNOSIS — M6259 Muscle wasting and atrophy, not elsewhere classified, multiple sites: Secondary | ICD-10-CM | POA: Diagnosis not present

## 2023-10-10 DIAGNOSIS — R1312 Dysphagia, oropharyngeal phase: Secondary | ICD-10-CM | POA: Diagnosis not present

## 2023-10-10 DIAGNOSIS — M6259 Muscle wasting and atrophy, not elsewhere classified, multiple sites: Secondary | ICD-10-CM | POA: Diagnosis not present

## 2023-10-11 DIAGNOSIS — R1312 Dysphagia, oropharyngeal phase: Secondary | ICD-10-CM | POA: Diagnosis not present

## 2023-10-11 DIAGNOSIS — M6259 Muscle wasting and atrophy, not elsewhere classified, multiple sites: Secondary | ICD-10-CM | POA: Diagnosis not present

## 2023-10-12 DIAGNOSIS — R1312 Dysphagia, oropharyngeal phase: Secondary | ICD-10-CM | POA: Diagnosis not present

## 2023-10-12 DIAGNOSIS — M6259 Muscle wasting and atrophy, not elsewhere classified, multiple sites: Secondary | ICD-10-CM | POA: Diagnosis not present

## 2023-10-13 DIAGNOSIS — N186 End stage renal disease: Secondary | ICD-10-CM | POA: Diagnosis not present

## 2023-10-13 DIAGNOSIS — I482 Chronic atrial fibrillation, unspecified: Secondary | ICD-10-CM | POA: Diagnosis not present

## 2023-10-13 DIAGNOSIS — R1312 Dysphagia, oropharyngeal phase: Secondary | ICD-10-CM | POA: Diagnosis not present

## 2023-10-13 DIAGNOSIS — M6259 Muscle wasting and atrophy, not elsewhere classified, multiple sites: Secondary | ICD-10-CM | POA: Diagnosis not present

## 2023-10-14 DIAGNOSIS — M6259 Muscle wasting and atrophy, not elsewhere classified, multiple sites: Secondary | ICD-10-CM | POA: Diagnosis not present

## 2023-10-14 DIAGNOSIS — R1312 Dysphagia, oropharyngeal phase: Secondary | ICD-10-CM | POA: Diagnosis not present

## 2023-10-16 DIAGNOSIS — R1312 Dysphagia, oropharyngeal phase: Secondary | ICD-10-CM | POA: Diagnosis not present

## 2023-10-16 DIAGNOSIS — M6259 Muscle wasting and atrophy, not elsewhere classified, multiple sites: Secondary | ICD-10-CM | POA: Diagnosis not present

## 2023-10-17 DIAGNOSIS — I482 Chronic atrial fibrillation, unspecified: Secondary | ICD-10-CM | POA: Diagnosis not present

## 2023-10-17 DIAGNOSIS — N186 End stage renal disease: Secondary | ICD-10-CM | POA: Diagnosis not present

## 2023-10-17 DIAGNOSIS — M6259 Muscle wasting and atrophy, not elsewhere classified, multiple sites: Secondary | ICD-10-CM | POA: Diagnosis not present

## 2023-10-17 DIAGNOSIS — R1312 Dysphagia, oropharyngeal phase: Secondary | ICD-10-CM | POA: Diagnosis not present

## 2023-10-18 DIAGNOSIS — R1312 Dysphagia, oropharyngeal phase: Secondary | ICD-10-CM | POA: Diagnosis not present

## 2023-10-18 DIAGNOSIS — M6259 Muscle wasting and atrophy, not elsewhere classified, multiple sites: Secondary | ICD-10-CM | POA: Diagnosis not present

## 2023-10-20 DIAGNOSIS — R1312 Dysphagia, oropharyngeal phase: Secondary | ICD-10-CM | POA: Diagnosis not present

## 2023-10-20 DIAGNOSIS — M6259 Muscle wasting and atrophy, not elsewhere classified, multiple sites: Secondary | ICD-10-CM | POA: Diagnosis not present

## 2023-10-21 DIAGNOSIS — M6259 Muscle wasting and atrophy, not elsewhere classified, multiple sites: Secondary | ICD-10-CM | POA: Diagnosis not present

## 2023-10-21 DIAGNOSIS — R1312 Dysphagia, oropharyngeal phase: Secondary | ICD-10-CM | POA: Diagnosis not present

## 2023-10-21 DIAGNOSIS — N186 End stage renal disease: Secondary | ICD-10-CM | POA: Diagnosis not present

## 2023-10-24 ENCOUNTER — Telehealth: Payer: Self-pay

## 2023-10-26 NOTE — Telephone Encounter (Signed)
Alert received from [employer] for alert for exceed AF in progress from 12/23, poor rate control. Burden 4.5%, Xarelto per clinic summary 10/17/23 not on MAR. Route to triage for ongoing arrhythmia.  Noted in EPIC hx of GI bleed so not on OAC.   Spoke to patients daughter Efraim Kaufmann who advised patient passed away last night/early this morning. I offered my condolences and prayers. Advised I will let Dr. Elberta Fortis know and if we can do anything to please let us know. She was appreciative.

## 2023-10-26 DEATH — deceased

## 2023-11-16 ENCOUNTER — Ambulatory Visit: Payer: Medicare Other

## 2024-02-15 ENCOUNTER — Ambulatory Visit: Payer: Medicare Other

## 2024-05-16 ENCOUNTER — Ambulatory Visit: Payer: Medicare Other
